# Patient Record
Sex: Male | Born: 1945 | Race: White | Hispanic: No | Marital: Married | Smoking: Former smoker
Health system: Southern US, Community
[De-identification: ages and names within clinical notes are randomized; demographics above are authoritative.]

## PROBLEM LIST (undated history)

## (undated) DIAGNOSIS — I890 Lymphedema, not elsewhere classified: Secondary | ICD-10-CM

## (undated) DIAGNOSIS — C801 Malignant (primary) neoplasm, unspecified: Secondary | ICD-10-CM

## (undated) DIAGNOSIS — I10 Essential (primary) hypertension: Secondary | ICD-10-CM

## (undated) DIAGNOSIS — E785 Hyperlipidemia, unspecified: Secondary | ICD-10-CM

## (undated) HISTORY — DX: Essential (primary) hypertension: I10

## (undated) HISTORY — DX: Lymphedema, not elsewhere classified: I89.0

## (undated) HISTORY — PX: WRIST FRACTURE SURGERY: SHX121

## (undated) HISTORY — DX: Malignant (primary) neoplasm, unspecified: C80.1

## (undated) HISTORY — DX: Hyperlipidemia, unspecified: E78.5

---

## 2006-07-24 DIAGNOSIS — C801 Malignant (primary) neoplasm, unspecified: Secondary | ICD-10-CM

## 2006-07-24 HISTORY — DX: Malignant (primary) neoplasm, unspecified: C80.1

## 2006-07-24 HISTORY — PX: OTHER SURGICAL HISTORY: SHX169

## 2006-11-09 ENCOUNTER — Ambulatory Visit: Payer: Self-pay | Admitting: Oncology

## 2006-12-10 LAB — CBC WITH DIFFERENTIAL/PLATELET
Basophils Absolute: 0 10*3/uL (ref 0.0–0.1)
Eosinophils Absolute: 0.1 10*3/uL (ref 0.0–0.5)
HGB: 15.5 g/dL (ref 13.0–17.1)
MCV: 89.5 fL (ref 81.6–98.0)
MONO%: 8.2 % (ref 0.0–13.0)
NEUT#: 1.4 10*3/uL — ABNORMAL LOW (ref 1.5–6.5)
RBC: 5 10*6/uL (ref 4.20–5.71)
RDW: 11.2 % (ref 11.2–14.6)
WBC: 3.2 10*3/uL — ABNORMAL LOW (ref 4.0–10.0)
lymph#: 1.5 10*3/uL (ref 0.9–3.3)

## 2006-12-10 LAB — COMPREHENSIVE METABOLIC PANEL
AST: 75 U/L — ABNORMAL HIGH (ref 0–37)
Albumin: 3.8 g/dL (ref 3.5–5.2)
Alkaline Phosphatase: 56 U/L (ref 39–117)
BUN: 13 mg/dL (ref 6–23)
Calcium: 8.9 mg/dL (ref 8.4–10.5)
Chloride: 106 mEq/L (ref 96–112)
Glucose, Bld: 85 mg/dL (ref 70–99)
Potassium: 4.1 mEq/L (ref 3.5–5.3)
Sodium: 140 mEq/L (ref 135–145)
Total Protein: 6.3 g/dL (ref 6.0–8.3)

## 2006-12-13 ENCOUNTER — Encounter: Payer: Self-pay | Admitting: Internal Medicine

## 2006-12-13 ENCOUNTER — Ambulatory Visit: Payer: Self-pay | Admitting: Internal Medicine

## 2006-12-13 DIAGNOSIS — E785 Hyperlipidemia, unspecified: Secondary | ICD-10-CM

## 2006-12-13 DIAGNOSIS — I10 Essential (primary) hypertension: Secondary | ICD-10-CM | POA: Insufficient documentation

## 2006-12-13 LAB — COMPREHENSIVE METABOLIC PANEL
ALT: 56 U/L — ABNORMAL HIGH (ref 0–53)
AST: 33 U/L (ref 0–37)
Alkaline Phosphatase: 51 U/L (ref 39–117)
BUN: 23 mg/dL (ref 6–23)
Creatinine, Ser: 1.08 mg/dL (ref 0.40–1.50)
Potassium: 4.2 mEq/L (ref 3.5–5.3)

## 2006-12-13 LAB — CONVERTED CEMR LAB
Cholesterol, target level: 200 mg/dL
HDL goal, serum: 40 mg/dL
LDL Goal: 130 mg/dL

## 2006-12-13 LAB — CBC WITH DIFFERENTIAL/PLATELET
BASO%: 0.8 % (ref 0.0–2.0)
Basophils Absolute: 0 10*3/uL (ref 0.0–0.1)
EOS%: 0.7 % (ref 0.0–7.0)
HCT: 44.2 % (ref 38.7–49.9)
HGB: 15.9 g/dL (ref 13.0–17.1)
LYMPH%: 40.8 % (ref 14.0–48.0)
MCH: 32 pg (ref 28.0–33.4)
MCHC: 36 g/dL — ABNORMAL HIGH (ref 32.0–35.9)
MCV: 89 fL (ref 81.6–98.0)
MONO%: 9.1 % (ref 0.0–13.0)
NEUT%: 48.6 % (ref 40.0–75.0)
Platelets: 107 10*3/uL — ABNORMAL LOW (ref 145–400)

## 2006-12-18 ENCOUNTER — Ambulatory Visit: Payer: Self-pay | Admitting: Oncology

## 2006-12-18 LAB — CBC WITH DIFFERENTIAL/PLATELET
BASO%: 0.9 % (ref 0.0–2.0)
Basophils Absolute: 0 10*3/uL (ref 0.0–0.1)
EOS%: 1.2 % (ref 0.0–7.0)
HGB: 15 g/dL (ref 13.0–17.1)
MCH: 31.4 pg (ref 28.0–33.4)
MCHC: 35.6 g/dL (ref 32.0–35.9)
MCV: 88.3 fL (ref 81.6–98.0)
MONO%: 6.5 % (ref 0.0–13.0)
RBC: 4.76 10*6/uL (ref 4.20–5.71)
RDW: 11.4 % (ref 11.2–14.6)

## 2006-12-18 LAB — COMPREHENSIVE METABOLIC PANEL
ALT: 48 U/L (ref 0–53)
AST: 30 U/L (ref 0–37)
Albumin: 3.6 g/dL (ref 3.5–5.2)
Alkaline Phosphatase: 54 U/L (ref 39–117)
BUN: 13 mg/dL (ref 6–23)
Potassium: 3.8 mEq/L (ref 3.5–5.3)

## 2006-12-24 LAB — CBC WITH DIFFERENTIAL/PLATELET
BASO%: 0.7 % (ref 0.0–2.0)
HCT: 43.5 % (ref 38.7–49.9)
HGB: 15.4 g/dL (ref 13.0–17.1)
LYMPH%: 51.6 % — ABNORMAL HIGH (ref 14.0–48.0)
MCH: 30.8 pg (ref 28.0–33.4)
MCV: 87 fL (ref 81.6–98.0)
RDW: 11.3 % (ref 11.2–14.6)

## 2006-12-24 LAB — COMPREHENSIVE METABOLIC PANEL
ALT: 66 U/L — ABNORMAL HIGH (ref 0–53)
Albumin: 3.3 g/dL — ABNORMAL LOW (ref 3.5–5.2)
CO2: 27 mEq/L (ref 19–32)
Calcium: 8.1 mg/dL — ABNORMAL LOW (ref 8.4–10.5)
Chloride: 107 mEq/L (ref 96–112)
Glucose, Bld: 105 mg/dL — ABNORMAL HIGH (ref 70–99)
Potassium: 3.9 mEq/L (ref 3.5–5.3)
Sodium: 140 mEq/L (ref 135–145)
Total Bilirubin: 0.6 mg/dL (ref 0.3–1.2)
Total Protein: 5.6 g/dL — ABNORMAL LOW (ref 6.0–8.3)

## 2006-12-27 LAB — COMPREHENSIVE METABOLIC PANEL
ALT: 65 U/L — ABNORMAL HIGH (ref 0–53)
Albumin: 3.1 g/dL — ABNORMAL LOW (ref 3.5–5.2)
Alkaline Phosphatase: 50 U/L (ref 39–117)
CO2: 28 mEq/L (ref 19–32)
Glucose, Bld: 100 mg/dL — ABNORMAL HIGH (ref 70–99)
Potassium: 4.2 mEq/L (ref 3.5–5.3)
Sodium: 137 mEq/L (ref 135–145)
Total Bilirubin: 0.6 mg/dL (ref 0.3–1.2)
Total Protein: 5.1 g/dL — ABNORMAL LOW (ref 6.0–8.3)

## 2006-12-27 LAB — CBC WITH DIFFERENTIAL/PLATELET
BASO%: 0.3 % (ref 0.0–2.0)
Eosinophils Absolute: 0 10*3/uL (ref 0.0–0.5)
LYMPH%: 49.7 % — ABNORMAL HIGH (ref 14.0–48.0)
MCHC: 34.7 g/dL (ref 32.0–35.9)
MONO#: 0.3 10*3/uL (ref 0.1–0.9)
NEUT#: 1 10*3/uL — ABNORMAL LOW (ref 1.5–6.5)
Platelets: 101 10*3/uL — ABNORMAL LOW (ref 145–400)
RBC: 4.76 10*6/uL (ref 4.20–5.71)
RDW: 11.3 % (ref 11.2–14.6)
WBC: 2.6 10*3/uL — ABNORMAL LOW (ref 4.0–10.0)

## 2006-12-28 LAB — CBC WITH DIFFERENTIAL/PLATELET
BASO%: 0.6 % (ref 0.0–2.0)
LYMPH%: 40 % (ref 14.0–48.0)
MCHC: 34.6 g/dL (ref 32.0–35.9)
MCV: 88.1 fL (ref 81.6–98.0)
MONO%: 11.7 % (ref 0.0–13.0)
Platelets: 100 10*3/uL — ABNORMAL LOW (ref 145–400)
RBC: 4.94 10*6/uL (ref 4.20–5.71)
RDW: 11.3 % (ref 11.2–14.6)
WBC: 2.2 10*3/uL — ABNORMAL LOW (ref 4.0–10.0)

## 2007-01-03 LAB — CBC WITH DIFFERENTIAL/PLATELET
BASO%: 0.9 % (ref 0.0–2.0)
MCHC: 35 g/dL (ref 32.0–35.9)
MONO#: 0.4 10*3/uL (ref 0.1–0.9)
NEUT#: 1.7 10*3/uL (ref 1.5–6.5)
RBC: 4.94 10*6/uL (ref 4.20–5.71)
RDW: 11.2 % (ref 11.2–14.6)
WBC: 3.3 10*3/uL — ABNORMAL LOW (ref 4.0–10.0)
lymph#: 1.2 10*3/uL (ref 0.9–3.3)

## 2007-01-10 ENCOUNTER — Encounter: Payer: Self-pay | Admitting: Internal Medicine

## 2007-01-10 LAB — COMPREHENSIVE METABOLIC PANEL
ALT: 28 U/L (ref 0–53)
CO2: 31 mEq/L (ref 19–32)
Calcium: 8.9 mg/dL (ref 8.4–10.5)
Chloride: 108 mEq/L (ref 96–112)
Creatinine, Ser: 0.99 mg/dL (ref 0.40–1.50)
Glucose, Bld: 94 mg/dL (ref 70–99)
Total Protein: 5.4 g/dL — ABNORMAL LOW (ref 6.0–8.3)

## 2007-01-10 LAB — CBC WITH DIFFERENTIAL/PLATELET
BASO%: 0.7 % (ref 0.0–2.0)
Eosinophils Absolute: 0.1 10*3/uL (ref 0.0–0.5)
HCT: 39.3 % (ref 38.7–49.9)
HGB: 13.8 g/dL (ref 13.0–17.1)
MCHC: 35.2 g/dL (ref 32.0–35.9)
MONO#: 0.6 10*3/uL (ref 0.1–0.9)
NEUT#: 3.8 10*3/uL (ref 1.5–6.5)
NEUT%: 65.7 % (ref 40.0–75.0)
WBC: 5.7 10*3/uL (ref 4.0–10.0)
lymph#: 1.2 10*3/uL (ref 0.9–3.3)

## 2007-01-31 ENCOUNTER — Telehealth: Payer: Self-pay | Admitting: Internal Medicine

## 2007-01-31 ENCOUNTER — Ambulatory Visit: Payer: Self-pay | Admitting: Oncology

## 2007-02-04 LAB — CBC WITH DIFFERENTIAL/PLATELET
BASO%: 0.6 % (ref 0.0–2.0)
HCT: 40.6 % (ref 38.7–49.9)
MCHC: 34.4 g/dL (ref 32.0–35.9)
MONO#: 0.2 10*3/uL (ref 0.1–0.9)
RBC: 4.49 10*6/uL (ref 4.20–5.71)
WBC: 4.7 10*3/uL (ref 4.0–10.0)
lymph#: 1.4 10*3/uL (ref 0.9–3.3)

## 2007-02-04 LAB — COMPREHENSIVE METABOLIC PANEL
ALT: 24 U/L (ref 0–53)
CO2: 29 mEq/L (ref 19–32)
Calcium: 8.6 mg/dL (ref 8.4–10.5)
Chloride: 107 mEq/L (ref 96–112)
Sodium: 143 mEq/L (ref 135–145)
Total Protein: 5.9 g/dL — ABNORMAL LOW (ref 6.0–8.3)

## 2007-02-19 LAB — CBC WITH DIFFERENTIAL/PLATELET
BASO%: 0.2 % (ref 0.0–2.0)
LYMPH%: 20.2 % (ref 14.0–48.0)
MCHC: 35.3 g/dL (ref 32.0–35.9)
MONO#: 0.2 10*3/uL (ref 0.1–0.9)
RBC: 4.53 10*6/uL (ref 4.20–5.71)
WBC: 2.4 10*3/uL — ABNORMAL LOW (ref 4.0–10.0)
lymph#: 0.5 10*3/uL — ABNORMAL LOW (ref 0.9–3.3)

## 2007-02-19 LAB — COMPREHENSIVE METABOLIC PANEL
ALT: 43 U/L (ref 0–53)
AST: 33 U/L (ref 0–37)
CO2: 30 mEq/L (ref 19–32)
Chloride: 101 mEq/L (ref 96–112)
Sodium: 136 mEq/L (ref 135–145)
Total Bilirubin: 0.6 mg/dL (ref 0.3–1.2)
Total Protein: 6 g/dL (ref 6.0–8.3)

## 2007-02-25 LAB — CBC WITH DIFFERENTIAL/PLATELET
EOS%: 1.1 % (ref 0.0–7.0)
HGB: 14.4 g/dL (ref 13.0–17.1)
MCH: 31.4 pg (ref 28.0–33.4)
MCV: 88.9 fL (ref 81.6–98.0)
MONO%: 12.1 % (ref 0.0–13.0)
NEUT#: 1.2 10*3/uL — ABNORMAL LOW (ref 1.5–6.5)
RBC: 4.57 10*6/uL (ref 4.20–5.71)
RDW: 14.6 % (ref 11.2–14.6)
lymph#: 0.9 10*3/uL (ref 0.9–3.3)

## 2007-02-25 LAB — COMPREHENSIVE METABOLIC PANEL
ALT: 37 U/L (ref 0–53)
AST: 30 U/L (ref 0–37)
Albumin: 4 g/dL (ref 3.5–5.2)
Alkaline Phosphatase: 69 U/L (ref 39–117)
Calcium: 8.3 mg/dL — ABNORMAL LOW (ref 8.4–10.5)
Chloride: 106 mEq/L (ref 96–112)
Potassium: 4.3 mEq/L (ref 3.5–5.3)
Sodium: 142 mEq/L (ref 135–145)
Total Protein: 6.1 g/dL (ref 6.0–8.3)

## 2007-03-06 LAB — CBC WITH DIFFERENTIAL/PLATELET
BASO%: 0.6 % (ref 0.0–2.0)
Basophils Absolute: 0 10e3/uL (ref 0.0–0.1)
EOS%: 1.2 % (ref 0.0–7.0)
Eosinophils Absolute: 0 10e3/uL (ref 0.0–0.5)
HCT: 41.3 % (ref 38.7–49.9)
HGB: 14.4 g/dL (ref 13.0–17.1)
LYMPH%: 43.2 % (ref 14.0–48.0)
MCH: 31.1 pg (ref 28.0–33.4)
MCHC: 35 g/dL (ref 32.0–35.9)
MCV: 89.1 fL (ref 81.6–98.0)
MONO#: 0.4 10e3/uL (ref 0.1–0.9)
MONO%: 11.5 % (ref 0.0–13.0)
NEUT#: 1.3 10e3/uL — ABNORMAL LOW (ref 1.5–6.5)
NEUT%: 43.5 % (ref 40.0–75.0)
Platelets: 74 10e3/uL — ABNORMAL LOW (ref 145–400)
RBC: 4.64 10e6/uL (ref 4.20–5.71)
RDW: 12.5 % (ref 11.2–14.6)
WBC: 3.1 10e3/uL — ABNORMAL LOW (ref 4.0–10.0)
lymph#: 1.3 10e3/uL (ref 0.9–3.3)

## 2007-03-13 LAB — CBC WITH DIFFERENTIAL/PLATELET
EOS%: 2.6 % (ref 0.0–7.0)
Eosinophils Absolute: 0.1 10*3/uL (ref 0.0–0.5)
MCV: 89 fL (ref 81.6–98.0)
MONO%: 15 % — ABNORMAL HIGH (ref 0.0–13.0)
NEUT#: 1.2 10*3/uL — ABNORMAL LOW (ref 1.5–6.5)
RBC: 4.49 10*6/uL (ref 4.20–5.71)
RDW: 12.6 % (ref 11.2–14.6)
lymph#: 1.1 10*3/uL (ref 0.9–3.3)

## 2007-03-19 ENCOUNTER — Ambulatory Visit: Payer: Self-pay | Admitting: Oncology

## 2007-03-21 LAB — CBC WITH DIFFERENTIAL/PLATELET
Eosinophils Absolute: 0 10*3/uL (ref 0.0–0.5)
HGB: 13.8 g/dL (ref 13.0–17.1)
MONO#: 0.3 10*3/uL (ref 0.1–0.9)
NEUT#: 1.5 10*3/uL (ref 1.5–6.5)
Platelets: 80 10*3/uL — ABNORMAL LOW (ref 145–400)
RBC: 4.4 10*6/uL (ref 4.20–5.71)
RDW: 15.1 % — ABNORMAL HIGH (ref 11.2–14.6)
WBC: 2.3 10*3/uL — ABNORMAL LOW (ref 4.0–10.0)

## 2007-03-21 LAB — COMPREHENSIVE METABOLIC PANEL
ALT: 39 U/L (ref 0–53)
AST: 28 U/L (ref 0–37)
Alkaline Phosphatase: 64 U/L (ref 39–117)
BUN: 18 mg/dL (ref 6–23)
CO2: 26 mEq/L (ref 19–32)
Creatinine, Ser: 0.88 mg/dL (ref 0.40–1.50)
Glucose, Bld: 91 mg/dL (ref 70–99)
Sodium: 144 mEq/L (ref 135–145)

## 2007-04-02 ENCOUNTER — Ambulatory Visit: Payer: Self-pay | Admitting: Internal Medicine

## 2007-04-03 LAB — CONVERTED CEMR LAB
ALT: 44 units/L (ref 0–53)
AST: 33 units/L (ref 0–37)
Albumin: 3.7 g/dL (ref 3.5–5.2)
Alkaline Phosphatase: 69 units/L (ref 39–117)
BUN: 21 mg/dL (ref 6–23)
Bilirubin, Direct: 0.1 mg/dL (ref 0.0–0.3)
CO2: 29 meq/L (ref 19–32)
Calcium: 8.6 mg/dL (ref 8.4–10.5)
Chloride: 108 meq/L (ref 96–112)
Cholesterol: 181 mg/dL (ref 0–200)
Creatinine, Ser: 0.7 mg/dL (ref 0.4–1.5)
Direct LDL: 68 mg/dL
GFR calc Af Amer: 148 mL/min
GFR calc non Af Amer: 122 mL/min
Glucose, Bld: 99 mg/dL (ref 70–99)
HDL: 31.2 mg/dL — ABNORMAL LOW (ref >39.0)
Potassium: 4.3 meq/L (ref 3.5–5.1)
Sodium: 143 meq/L (ref 135–145)
Total Bilirubin: 0.8 mg/dL (ref 0.3–1.2)
Total CHOL/HDL Ratio: 5.8
Total Protein: 5.7 g/dL — ABNORMAL LOW (ref 6.0–8.3)
Triglycerides: 551 mg/dL (ref 0–149)
VLDL: 110 mg/dL — ABNORMAL HIGH (ref 0–40)

## 2007-04-18 ENCOUNTER — Encounter: Payer: Self-pay | Admitting: Internal Medicine

## 2007-04-18 LAB — CBC WITH DIFFERENTIAL/PLATELET
BASO%: 0.1 % (ref 0.0–2.0)
Basophils Absolute: 0 10*3/uL (ref 0.0–0.1)
EOS%: 3.6 % (ref 0.0–7.0)
HCT: 40.9 % (ref 38.7–49.9)
HGB: 14.6 g/dL (ref 13.0–17.1)
LYMPH%: 41.7 % (ref 14.0–48.0)
MCH: 32.4 pg (ref 28.0–33.4)
MCHC: 35.6 g/dL (ref 32.0–35.9)
MCV: 91.1 fL (ref 81.6–98.0)
MONO%: 13.3 % — ABNORMAL HIGH (ref 0.0–13.0)
NEUT%: 41.3 % (ref 40.0–75.0)

## 2007-04-18 LAB — COMPREHENSIVE METABOLIC PANEL
AST: 28 U/L (ref 0–37)
Alkaline Phosphatase: 62 U/L (ref 39–117)
BUN: 21 mg/dL (ref 6–23)
Calcium: 8.4 mg/dL (ref 8.4–10.5)
Creatinine, Ser: 0.88 mg/dL (ref 0.40–1.50)
Total Bilirubin: 0.5 mg/dL (ref 0.3–1.2)

## 2007-04-25 LAB — CBC WITH DIFFERENTIAL/PLATELET
Basophils Absolute: 0 10*3/uL (ref 0.0–0.1)
EOS%: 2.7 % (ref 0.0–7.0)
HCT: 42.1 % (ref 38.7–49.9)
HGB: 14.8 g/dL (ref 13.0–17.1)
LYMPH%: 23 % (ref 14.0–48.0)
MCH: 31.9 pg (ref 28.0–33.4)
MCV: 90.7 fL (ref 81.6–98.0)
NEUT%: 60.2 % (ref 40.0–75.0)
Platelets: 81 10*3/uL — ABNORMAL LOW (ref 145–400)
lymph#: 0.6 10*3/uL — ABNORMAL LOW (ref 0.9–3.3)

## 2007-06-06 ENCOUNTER — Ambulatory Visit: Payer: Self-pay | Admitting: Oncology

## 2007-06-10 ENCOUNTER — Encounter: Payer: Self-pay | Admitting: Internal Medicine

## 2007-06-10 LAB — CBC WITH DIFFERENTIAL/PLATELET
Basophils Absolute: 0 10*3/uL (ref 0.0–0.1)
EOS%: 3.7 % (ref 0.0–7.0)
HCT: 39.4 % (ref 38.7–49.9)
HGB: 13.7 g/dL (ref 13.0–17.1)
MCH: 32.2 pg (ref 28.0–33.4)
MCHC: 34.8 g/dL (ref 32.0–35.9)
MCV: 92.6 fL (ref 81.6–98.0)
MONO%: 7.2 % (ref 0.0–13.0)
NEUT%: 53 % (ref 40.0–75.0)
RDW: 14.4 % (ref 11.2–14.6)

## 2007-06-10 LAB — COMPREHENSIVE METABOLIC PANEL
AST: 28 U/L (ref 0–37)
Alkaline Phosphatase: 63 U/L (ref 39–117)
BUN: 16 mg/dL (ref 6–23)
Creatinine, Ser: 0.82 mg/dL (ref 0.40–1.50)

## 2007-07-01 LAB — CBC WITH DIFFERENTIAL/PLATELET
Basophils Absolute: 0.1 10*3/uL (ref 0.0–0.1)
EOS%: 3.8 % (ref 0.0–7.0)
HGB: 13.9 g/dL (ref 13.0–17.1)
MCH: 33.5 pg — ABNORMAL HIGH (ref 28.0–33.4)
NEUT#: 1.7 10*3/uL (ref 1.5–6.5)
RDW: 14.3 % (ref 11.2–14.6)
lymph#: 1 10*3/uL (ref 0.9–3.3)

## 2007-07-16 ENCOUNTER — Ambulatory Visit: Payer: Self-pay | Admitting: Oncology

## 2007-07-22 ENCOUNTER — Encounter: Payer: Self-pay | Admitting: Internal Medicine

## 2007-07-22 LAB — CBC WITH DIFFERENTIAL/PLATELET
Basophils Absolute: 0 10*3/uL (ref 0.0–0.1)
Eosinophils Absolute: 0.1 10*3/uL (ref 0.0–0.5)
HGB: 14.4 g/dL (ref 13.0–17.1)
MCV: 93.2 fL (ref 81.6–98.0)
MONO#: 0.3 10*3/uL (ref 0.1–0.9)
MONO%: 8.7 % (ref 0.0–13.0)
NEUT#: 2.2 10*3/uL (ref 1.5–6.5)
RDW: 13.8 % (ref 11.2–14.6)
WBC: 3.7 10*3/uL — ABNORMAL LOW (ref 4.0–10.0)
lymph#: 1 10*3/uL (ref 0.9–3.3)

## 2007-07-22 LAB — COMPREHENSIVE METABOLIC PANEL
Albumin: 4.2 g/dL (ref 3.5–5.2)
BUN: 16 mg/dL (ref 6–23)
CO2: 25 mEq/L (ref 19–32)
Calcium: 9 mg/dL (ref 8.4–10.5)
Chloride: 106 mEq/L (ref 96–112)
Glucose, Bld: 89 mg/dL (ref 70–99)
Potassium: 4.2 mEq/L (ref 3.5–5.3)

## 2007-07-25 HISTORY — PX: OTHER SURGICAL HISTORY: SHX169

## 2007-08-01 ENCOUNTER — Encounter: Payer: Self-pay | Admitting: Internal Medicine

## 2007-08-23 ENCOUNTER — Encounter: Payer: Self-pay | Admitting: Internal Medicine

## 2007-08-23 LAB — CBC WITH DIFFERENTIAL/PLATELET
Basophils Absolute: 0 10*3/uL (ref 0.0–0.1)
EOS%: 4.3 % (ref 0.0–7.0)
Eosinophils Absolute: 0.2 10*3/uL (ref 0.0–0.5)
HGB: 15.3 g/dL (ref 13.0–17.1)
NEUT#: 3.2 10*3/uL (ref 1.5–6.5)
RBC: 4.55 10*6/uL (ref 4.20–5.71)
RDW: 11.8 % (ref 11.2–14.6)
WBC: 4.7 10*3/uL (ref 4.0–10.0)
lymph#: 0.9 10*3/uL (ref 0.9–3.3)

## 2007-09-16 ENCOUNTER — Ambulatory Visit (HOSPITAL_COMMUNITY): Admission: RE | Admit: 2007-09-16 | Discharge: 2007-09-16 | Payer: Self-pay | Admitting: Surgical Oncology

## 2007-10-03 ENCOUNTER — Ambulatory Visit: Payer: Self-pay | Admitting: Internal Medicine

## 2007-10-03 DIAGNOSIS — G4700 Insomnia, unspecified: Secondary | ICD-10-CM

## 2007-10-04 LAB — CONVERTED CEMR LAB
Alkaline Phosphatase: 52 units/L (ref 39–117)
BUN: 14 mg/dL (ref 6–23)
CO2: 29 meq/L (ref 19–32)
Creatinine, Ser: 1 mg/dL (ref 0.4–1.5)
Potassium: 4.2 meq/L (ref 3.5–5.1)
Total Bilirubin: 0.7 mg/dL (ref 0.3–1.2)
Total Protein: 5.8 g/dL — ABNORMAL LOW (ref 6.0–8.3)

## 2007-11-15 ENCOUNTER — Ambulatory Visit: Payer: Self-pay | Admitting: Oncology

## 2007-11-19 ENCOUNTER — Encounter: Payer: Self-pay | Admitting: Internal Medicine

## 2008-05-19 ENCOUNTER — Ambulatory Visit: Payer: Self-pay | Admitting: Internal Medicine

## 2008-06-16 ENCOUNTER — Ambulatory Visit: Payer: Self-pay | Admitting: Oncology

## 2008-06-19 ENCOUNTER — Encounter: Payer: Self-pay | Admitting: Internal Medicine

## 2008-06-19 LAB — CBC WITH DIFFERENTIAL/PLATELET
BASO%: 0.4 % (ref 0.0–2.0)
EOS%: 4.8 % (ref 0.0–7.0)
HGB: 15.4 g/dL (ref 13.0–17.1)
MCH: 33.1 pg (ref 28.0–33.4)
MCHC: 34.9 g/dL (ref 32.0–35.9)
MCV: 94.9 fL (ref 81.6–98.0)
MONO%: 7.5 % (ref 0.0–13.0)
RBC: 4.66 10*6/uL (ref 4.20–5.71)
RDW: 12.9 % (ref 11.2–14.6)
lymph#: 2.1 10*3/uL (ref 0.9–3.3)

## 2008-12-15 ENCOUNTER — Ambulatory Visit: Payer: Self-pay | Admitting: Oncology

## 2008-12-17 ENCOUNTER — Encounter: Payer: Self-pay | Admitting: Internal Medicine

## 2008-12-17 LAB — CBC WITH DIFFERENTIAL/PLATELET
BASO%: 0.4 % (ref 0.0–2.0)
Basophils Absolute: 0 10*3/uL (ref 0.0–0.1)
EOS%: 2.7 % (ref 0.0–7.0)
Eosinophils Absolute: 0.2 10*3/uL (ref 0.0–0.5)
HCT: 44.4 % (ref 38.4–49.9)
HGB: 15.3 g/dL (ref 13.0–17.1)
LYMPH%: 25.4 % (ref 14.0–49.0)
MCH: 32.5 pg (ref 27.2–33.4)
MCHC: 34.5 g/dL (ref 32.0–36.0)
MCV: 94.1 fL (ref 79.3–98.0)
MONO#: 0.4 10*3/uL (ref 0.1–0.9)
MONO%: 5.4 % (ref 0.0–14.0)
NEUT#: 4.5 10*3/uL (ref 1.5–6.5)
NEUT%: 66.1 % (ref 39.0–75.0)
Platelets: 143 10*3/uL (ref 140–400)
RBC: 4.71 10*6/uL (ref 4.20–5.82)
RDW: 13.1 % (ref 11.0–14.6)
WBC: 6.8 10*3/uL (ref 4.0–10.3)
lymph#: 1.7 10*3/uL (ref 0.9–3.3)

## 2009-01-11 ENCOUNTER — Telehealth: Payer: Self-pay | Admitting: Internal Medicine

## 2009-03-09 ENCOUNTER — Ambulatory Visit: Payer: Self-pay | Admitting: Internal Medicine

## 2009-03-09 DIAGNOSIS — Z85820 Personal history of malignant melanoma of skin: Secondary | ICD-10-CM | POA: Insufficient documentation

## 2009-03-11 LAB — CONVERTED CEMR LAB
AST: 21 units/L (ref 0–37)
Alkaline Phosphatase: 66 units/L (ref 39–117)
BUN: 15 mg/dL (ref 6–23)
Basophils Absolute: 0 10*3/uL (ref 0.0–0.1)
Bilirubin, Direct: 0.1 mg/dL (ref 0.0–0.3)
Calcium: 8.7 mg/dL (ref 8.4–10.5)
GFR calc non Af Amer: 80.23 mL/min (ref >60)
Glucose, Bld: 115 mg/dL — ABNORMAL HIGH (ref 70–99)
Hemoglobin: 15 g/dL (ref 13.0–17.0)
LDL Cholesterol: 58 mg/dL (ref 0–99)
Lymphocytes Relative: 26.5 % (ref 12.0–46.0)
Monocytes Relative: 7 % (ref 3.0–12.0)
Neutrophils Relative %: 62.1 % (ref 43.0–77.0)
Platelets: 133 10*3/uL — ABNORMAL LOW (ref 150.0–400.0)
RDW: 13.1 % (ref 11.5–14.6)
Sodium: 142 meq/L (ref 135–145)
TSH: 1.38 microintl units/mL (ref 0.35–5.50)
Total Bilirubin: 0.8 mg/dL (ref 0.3–1.2)
VLDL: 13 mg/dL (ref 0.0–40.0)

## 2009-03-26 ENCOUNTER — Ambulatory Visit: Payer: Self-pay | Admitting: Gastroenterology

## 2009-04-09 ENCOUNTER — Encounter: Payer: Self-pay | Admitting: Gastroenterology

## 2009-04-09 ENCOUNTER — Ambulatory Visit: Payer: Self-pay | Admitting: Gastroenterology

## 2009-04-09 LAB — HM COLONOSCOPY

## 2009-04-13 ENCOUNTER — Encounter: Payer: Self-pay | Admitting: Gastroenterology

## 2009-06-25 ENCOUNTER — Ambulatory Visit: Payer: Self-pay | Admitting: Oncology

## 2009-06-29 ENCOUNTER — Encounter: Payer: Self-pay | Admitting: Internal Medicine

## 2010-03-01 ENCOUNTER — Ambulatory Visit: Payer: Self-pay | Admitting: Oncology

## 2010-03-30 ENCOUNTER — Telehealth: Payer: Self-pay | Admitting: Internal Medicine

## 2010-04-15 ENCOUNTER — Ambulatory Visit: Payer: Self-pay | Admitting: Internal Medicine

## 2010-04-18 LAB — CONVERTED CEMR LAB
Albumin: 3.9 g/dL (ref 3.5–5.2)
Alkaline Phosphatase: 61 units/L (ref 39–117)
Bilirubin, Direct: 0.1 mg/dL (ref 0.0–0.3)
Calcium: 8.7 mg/dL (ref 8.4–10.5)
Creatinine, Ser: 1.1 mg/dL (ref 0.4–1.5)
Direct LDL: 77.5 mg/dL
GFR calc non Af Amer: 70.15 mL/min (ref >60)
HDL: 41.2 mg/dL (ref >39.00)
Sodium: 139 meq/L (ref 135–145)
Triglycerides: 206 mg/dL — ABNORMAL HIGH (ref 0.0–149.0)
VLDL: 41.2 mg/dL — ABNORMAL HIGH (ref 0.0–40.0)

## 2010-04-22 ENCOUNTER — Telehealth: Payer: Self-pay | Admitting: Internal Medicine

## 2010-05-05 ENCOUNTER — Ambulatory Visit: Payer: Self-pay | Admitting: Oncology

## 2010-05-09 ENCOUNTER — Encounter: Payer: Self-pay | Admitting: Internal Medicine

## 2010-07-13 ENCOUNTER — Ambulatory Visit: Payer: Self-pay | Admitting: Internal Medicine

## 2010-08-23 NOTE — Letter (Signed)
Summary:  Cancer Center  [facility]   Imported By: Maryln Gottron 06/13/2010 09:55:03  _____________________________________________________________________  External Attachment:    Type:   Image     Comment:   External Document

## 2010-08-23 NOTE — Progress Notes (Signed)
Summary: Refill--Alprazolam  Phone Note Refill Request Message from:  Fax from Pharmacy on March 30, 2010 2:10 PM  Refills Requested: Medication #1:  ALPRAZOLAM 0.5 MG TABS Take 1 tablet by mouth at bedtime as needed--NEEDS OFFICE VISIT FOR ADDITIONAL REFILLS. Please advise.  Next Appointment Scheduled: none Initial call taken by: Lucious Groves CMA,  March 30, 2010 2:09 PM  Follow-up for Phone Call        ok to refill #10/0 refills Follow-up by: Birdie Sons MD,  March 31, 2010 6:02 PM    Prescriptions: ALPRAZOLAM 0.5 MG TABS (ALPRAZOLAM) Take 1 tablet by mouth at bedtime as needed--NEEDS OFFICE VISIT FOR ADDITIONAL REFILLS  #10 x 0   Entered by:   Lucious Groves CMA   Authorized by:   Birdie Sons MD   Signed by:   Lynann Beaver CMA on 04/01/2010   Method used:   Telephoned to ...       [facility]* (retail)       [address]       [place], [place]  [address]       Ph: [phone]       Fax: [phone]   RxID:   [id number]

## 2010-08-23 NOTE — Progress Notes (Signed)
Summary: Alprazolam clarification  Phone Note From Pharmacy   Caller: Patient Call For: Birdie Sons MD Caller: [facility] Call For: [facility]  Summary of Call: [facility] faxed clarification on Alprazolam 0.5mg .  Questioning #10 with 2 RF.  Did Dr Cato Mulligan mean #30 with 2 RF? Initial call taken by: Sid Falcon LPN,  April 22, 2010 2:19 PM  Follow-up for Phone Call        #30 is fine Follow-up by: Birdie Sons MD,  April 22, 2010 2:22 PM  Additional Follow-up for Phone Call Additional follow up Details #1::        Rx called in #30 with 2 refills Additional Follow-up by: Sid Falcon LPN,  April 22, 2010 3:40 PM    Prescriptions: ALPRAZOLAM 0.5 MG TABS (ALPRAZOLAM) Take 1 tablet by mouth at bedtime as needed--NEEDS OFFICE VISIT FOR ADDITIONAL REFILLS  #30 x 2   Entered by:   Sid Falcon LPN   Authorized by:   Birdie Sons MD   Signed by:   Sid Falcon LPN on 16/04/9603   Method used:   Telephoned to ...       [facility]* (retail)       [address]       [place], [place]  [address]       Ph: [phone]       Fax: [phone]   RxID:   [id number]

## 2010-08-23 NOTE — Assessment & Plan Note (Signed)
Summary: med check and refill/cjr   Vital Signs:  Patient profile:   65 year old male Weight:      217 pounds BMI:     34.11 Temp:     99.4 degrees F oral Pulse rate:   72 / minute BP sitting:   120 / 80  (left arm) Cuff size:   regular  Vitals Entered By: Romualdo Bolk, CMA (AAMA) (April 15, 2010 10:30 AM)  Nutrition Counseling: Patient's BMI is greater than 25 and therefore counseled on weight management options. CC: Follow-up visit on meds   CC:  Follow-up visit on meds.  History of Present Illness:  Follow-Up Visit      This is a 65 year old man who presents for Follow-up visit.  The patient denies chest pain and palpitations.  Since the last visit the patient notes no new problems or concerns---admits to daytime sleepiness, terrible snoring.  The patient reports taking meds as prescribed.  When questioned about possible medication side effects, the patient notes none.   melanoma---sees dr Truett Perna q 6 months  All other systems reviewed and were negative   Preventive Screening-Counseling & Management  Alcohol-Tobacco     Alcohol drinks/day: 3     Smoking Status: quit  Current Problems (verified): 1)  Malignant Melanoma Skin Lower Limb Including Hip  (ICD-172.7) 2)  Insomnia-sleep Disorder-unspec  (ICD-780.52) 3)  Hypertension  (ICD-401.9) 4)  Hyperlipidemia  (ICD-272.4)  Current Medications (verified): 1)  Crestor 10 Mg Tabs (Rosuvastatin Calcium) .... Take 1 Tablet By Mouth Once A Day--Needs Office Visit For Additiona; Refills 2)  Metoprolol Tartrate 50 Mg Tabs (Metoprolol Tartrate) .... Take 1 Tablet By Mouth Twice A Day 3)  Alprazolam 0.5 Mg Tabs (Alprazolam) .... 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   Head:  normocephalic and atraumatic.   Eyes:  pupils equal and pupils round.   Ears:  R ear normal and L ear normal.   Neck:  No deformities, masses, or tenderness noted. Chest Wall:  No deformities, masses, tenderness or gynecomastia noted. Lungs:  Normal respiratory effort, chest expands symmetrically. Lungs are clear to auscultation, no crackles or wheezes. Abdomen:  soft and non-tender.   Skin:  turgor normal and color normal.   Psych:  good eye contact and not anxious appearing.     Impression & Recommendations:  Problem # 1:  MALIGNANT MELANOMA SKIN LOWER LIMB INCLUDING HIP (ICD-172.7) followed by [facility] and dr Truett Perna  Problem # 2:  INSOMNIA-SLEEP DISORDER-UNSPEC (ICD-780.52)  may have sleep apnea schedule study  Orders: Sleep Disorder Referral (Sleep Disorder)  Problem # 3:  HYPERTENSION (ICD-401.9)  controlled continue current medications  His updated medication list for this problem includes:    Metoprolol Tartrate 50 Mg Tabs (Metoprolol tartrate) .Marland Kitchen... Take 1 tablet by mouth twice a day  BP today: 120/80 Prior BP: 130/86 (03/09/2009)  Prior 10  Yr Risk Heart Disease: Not enough information (12/13/2006)  Labs Reviewed: K+: 4.8 (03/09/2009) Creat: : 1.0 (03/09/2009)   Chol: 115 (03/09/2009)   HDL: 44.10 (03/09/2009)   LDL: 58 (03/09/2009)   TG: 65.0 (03/09/2009)  Orders: Venipuncture (09811) Specimen Handling (91478) TLB-BMP (Basic Metabolic Panel-BMET)  (80048-METABOL)  Problem # 4:  HYPERLIPIDEMIA (ICD-272.4)  controlled needs labs today His updated medication list for this problem includes:    Crestor 10 Mg Tabs (Rosuvastatin calcium) .Marland Kitchen... Take 1 tablet by mouth once a day--needs office visit for additiona; refills  Labs Reviewed: SGOT: 21 (03/09/2009)   SGPT: 34 (03/09/2009)  Lipid Goals: Chol Goal: 200 (12/13/2006)   HDL Goal: 40 (12/13/2006)   LDL Goal: 130 (12/13/2006)   TG Goal: 150 (12/13/2006)  Prior 10 Yr Risk Heart Disease: Not enough information (12/13/2006)   HDL:44.10 (03/09/2009), 31.2 (04/02/2007)  LDL:58 (03/09/2009), DEL (29/56/2130)  Chol:115 (03/09/2009), 181 (04/02/2007)  Trig:65.0 (03/09/2009), 551 (04/02/2007)  Orders: Specimen Handling (86578) TLB-Hepatic/Liver Function Pnl (80076-HEPATIC) TLB-Lipid Panel (80061-LIPID) TLB-TSH (Thyroid Stimulating Hormone) (84443-TSH)  Complete Medication List: 1)  Crestor 10 Mg Tabs (Rosuvastatin calcium) .... Take 1 tablet by mouth once a day--needs office visit for additiona; refills 2)  Metoprolol Tartrate 50 Mg Tabs (Metoprolol tartrate) .... Take 1 tablet by mouth twice a day 3)  Alprazolam 0.5 Mg Tabs (Alprazolam) .... Take 1 tablet by mouth at bedtime as needed--needs office visit for additional refills  Other Orders: Admin 1st Vaccine (46962) Flu Vaccine 36yrs + (95284) Prescriptions: METOPROLOL TARTRATE 50 MG TABS (METOPROLOL TARTRATE) Take 1 tablet by mouth twice a day  #180 x 3   Entered and Authorized by:   Birdie Sons MD   Signed by:   Birdie Sons MD on 04/15/2010   Method used:   Electronically to        [facility]* (retail)       [address]       [place], [place]  [address]       Ph: [phone]       Fax: [phone]   RxID:   [id number] CRESTOR 10 MG TABS (ROSUVASTATIN CALCIUM) Take 1 tablet by mouth once a day--NEEDS OFFICE VISIT FOR ADDITIONA; REFILLS  #90 x 3   Entered and Authorized by:   Birdie Sons MD   Signed  by:   Birdie Sons MD on 04/15/2010   Method used:   Electronically to        [facility]* (retail)       [address]       [place], [place]  [address]       Ph: [phone]       Fax: [phone]   RxID:   [id number] CRESTOR 10 MG TABS (ROSUVASTATIN CALCIUM) Take 1 tablet by mouth once a day--NEEDS OFFICE VISIT FOR ADDITIONA; REFILLS  #90 x 3   Entered and Authorized by:   Birdie Sons MD   Signed by:   Birdie Sons MD on 04/15/2010   Method used:   Print then Give to Patient   RxID:   [id number] METOPROLOL TARTRATE 50 MG TABS (METOPROLOL TARTRATE) Take 1 tablet by mouth twice a day  #180 x 3   Entered and Authorized by:   Birdie Sons MD   Signed by:   Birdie Sons MD on 04/15/2010   Method used:   Print then Give to Patient   RxID:   [id number] ALPRAZOLAM 0.5 MG TABS (ALPRAZOLAM) Take 1 tablet by mouth at bedtime as  needed--NEEDS OFFICE VISIT FOR ADDITIONAL REFILLS  #10 x 2   Entered and Authorized by:   Birdie Sons MD   Signed by:   Birdie Sons MD on 04/15/2010   Method used:   Print then Give to Patient   RxID:   [id number]   Flu Vaccine Consent Questions     Do you have a history of severe allergic reactions to this vaccine? no    Any prior history of allergic reactions to egg and/or gelatin? no    Do you have a sensitivity to the preservative Thimersol? no    Do you have a past history of Guillan-Barre Syndrome? no    Do you currently have an acute febrile illness? no    Have you ever had a severe reaction to latex? no    Vaccine information given and explained to patient? yes    Are you currently pregnant? no    Lot Number:[id number]   Exp Date:01/21/2011   Site Given  Left Deltoid IM Romualdo Bolk, CMA (AAMA)  April 15, 2010 10:32 AM   .lbflu

## 2010-08-23 NOTE — Letter (Signed)
Summary: [facility]  [facility]   Imported By: Maryln Gottron 08/05/2009 15:01:16  _____________________________________________________________________  External Attachment:    Type:   Image     Comment:   External Document

## 2010-08-25 NOTE — Assessment & Plan Note (Signed)
Summary: RENEW MEDS//SLM   Vital Signs:  Patient profile:   65 year old male Weight:      220 pounds Temp:     98.6 degrees F oral Pulse rate:   64 / minute Pulse rhythm:   regular BP sitting:   114 / 84  (left arm) Cuff size:   large  Vitals Entered By: Alfred Levins, CMA (July 13, 2010 11:57 AM) CC: renew meds   CC:  renew meds.  History of Present Illness: here for refills-misunderstanding---meds refilled  Current Medications (verified): 1)  Crestor 10 Mg Tabs (Rosuvastatin Calcium) .... Take 1 Tablet By Mouth Once A Day--Needs Office Visit For Additiona; Refills 2)  Metoprolol Tartrate 50 Mg Tabs (Metoprolol Tartrate) .... Take 1 Tablet By Mouth Twice A Day 3)  Alprazolam 0.5 Mg Tabs (Alprazolam) .... Take 1 Tablet By Mouth At Bedtime As Needed--Needs Office Visit For Additional Refills  Allergies (verified): No Known Drug Allergies  Physical Exam  General:  alert and well-developed.   Head:  normocephalic and atraumatic.   Neck:  No deformities, masses, or tenderness noted. Lungs:  Normal respiratory effort, chest expands symmetrically. Lungs are clear to auscultation, no crackles or wheezes. Heart:  Normal rate and regular rhythm. S1 and S2 normal without gallop, murmur, click, rub or other extra sounds. Abdomen:  soft and non-tender.   Skin:  turgor normal and color normal.   Psych:  memory intact for recent and remote and normally interactive.     Impression & Recommendations:  Problem # 1:  MALIGNANT MELANOMA SKIN LOWER LIMB INCLUDING HIP (ICD-172.7) followed by oncology  Problem # 2:  HYPERTENSION (ICD-401.9)  controlled continue current medications  His updated medication list for this problem includes:    Metoprolol Tartrate 50 Mg Tabs (Metoprolol tartrate) .Marland Kitchen... Take 1 tablet by mouth twice a day  BP today: 114/84 Prior BP: 120/80 (04/15/2010)  Prior 10 Yr Risk Heart Disease: Not enough information (12/13/2006)  Labs Reviewed: K+: 4.4  (04/15/2010) Creat: : 1.1 (04/15/2010)   Chol: 144 (04/15/2010)   HDL: 41.20 (04/15/2010)   LDL: 58 (03/09/2009)   TG: 206.0 (04/15/2010)  Orders: No Charge Patient Arrived (NCPA0) (NCPA0)  Problem # 3:  HYPERLIPIDEMIA (ICD-272.4)  His updated medication list for this problem includes:    Crestor 10 Mg Tabs (Rosuvastatin calcium) .Marland Kitchen... Take 1 tablet by mouth once a day--  Labs Reviewed: SGOT: 23 (04/15/2010)   SGPT: 35 (04/15/2010)  Lipid Goals: Chol Goal: 200 (12/13/2006)   HDL Goal: 40 (12/13/2006)   LDL Goal: 130 (12/13/2006)   TG Goal: 150 (12/13/2006)  Prior 10 Yr Risk Heart Disease: Not enough information (12/13/2006)   HDL:41.20 (04/15/2010), 44.10 (03/09/2009)  LDL:58 (03/09/2009), DEL (16/04/9603)  Chol:144 (04/15/2010), 115 (03/09/2009)  Trig:206.0 (04/15/2010), 65.0 (03/09/2009)  Orders: No Charge Patient Arrived (NCPA0) (NCPA0)  Problem # 4:  OBESITY (ICD-278.00)  Ht: 67 (12/13/2006)   Wt: 220 (07/13/2010)   BMI: 34.11 (04/15/2010)  Complete Medication List: 1)  Crestor 10 Mg Tabs (Rosuvastatin calcium) .... Take 1 tablet by mouth once a day-- 2)  Metoprolol Tartrate 50 Mg Tabs (Metoprolol tartrate) .... Take 1 tablet by mouth twice a day 3)  Alprazolam 0.5 Mg Tabs (Alprazolam) .... Take 1 tablet by mouth at bedtime as needed-- Prescriptions: ALPRAZOLAM 0.5 MG TABS (ALPRAZOLAM) Take 1 tablet by mouth at bedtime as needed--  #30 x 3   Entered by:   Alfred Levins, CMA   Authorized by:   Birdie Sons MD  Signed by:   Alfred Levins, CMA on 07/15/2010   Method used:   Telephoned to ...       [facility]* (retail)       [address]       [place], [place]  [address]       Ph: [phone]       Fax: [phone]   RxID:   [id number] METOPROLOL TARTRATE 50 MG TABS (METOPROLOL TARTRATE) Take 1 tablet by mouth twice a day  #180 x 3   Entered and Authorized by:   Birdie Sons MD   Signed by:   Birdie Sons MD on 07/13/2010   Method used:    Electronically to        [facility]* (retail)       [address]       [place], [place]  [address]       Ph: [phone]       Fax: [phone]   RxID:   [id number] CRESTOR 10 MG TABS (ROSUVASTATIN CALCIUM) Take 1 tablet by mouth once a day--  #90 x 3   Entered and Authorized by:   Birdie Sons MD   Signed by:   Birdie Sons MD on 07/13/2010   Method used:   Electronically to        [facility]* (retail)       [address]       [place], [place]  [address]       Ph: [phone]       Fax: [phone]   RxID:   [id number]    Orders Added: 1)  No Charge Patient Arrived (NCPA0) [NCPA0]

## 2010-11-08 ENCOUNTER — Encounter (HOSPITAL_BASED_OUTPATIENT_CLINIC_OR_DEPARTMENT_OTHER): Payer: PRIVATE HEALTH INSURANCE | Admitting: Oncology

## 2010-11-08 DIAGNOSIS — C4370 Malignant melanoma of unspecified lower limb, including hip: Secondary | ICD-10-CM

## 2010-11-08 DIAGNOSIS — D72819 Decreased white blood cell count, unspecified: Secondary | ICD-10-CM

## 2010-11-08 DIAGNOSIS — D696 Thrombocytopenia, unspecified: Secondary | ICD-10-CM

## 2010-11-14 ENCOUNTER — Other Ambulatory Visit: Payer: Self-pay | Admitting: *Deleted

## 2010-11-14 MED ORDER — ALPRAZOLAM 0.5 MG PO TABS: 0.5000 mg | ORAL_TABLET | Freq: Every evening | ORAL | Status: DC | PRN

## 2011-04-13 ENCOUNTER — Telehealth: Payer: Self-pay | Admitting: Internal Medicine

## 2011-04-13 MED ORDER — ALPRAZOLAM 0.5 MG PO TABS: 0.5000 mg | ORAL_TABLET | Freq: Every evening | ORAL | Status: DC | PRN

## 2011-04-13 NOTE — Telephone Encounter (Signed)
Pt requesting refill on ALPRAZolam (XANAX) 0.5 MG tablet.   Pt is scheduled for a med follow up on 04/24/11. Please Send to [facility]

## 2011-04-13 NOTE — Telephone Encounter (Signed)
rx called in

## 2011-04-17 ENCOUNTER — Other Ambulatory Visit: Payer: Self-pay | Admitting: Internal Medicine

## 2011-04-21 ENCOUNTER — Encounter: Payer: Self-pay | Admitting: Internal Medicine

## 2011-04-24 ENCOUNTER — Encounter: Payer: Self-pay | Admitting: Internal Medicine

## 2011-04-24 ENCOUNTER — Ambulatory Visit (INDEPENDENT_AMBULATORY_CARE_PROVIDER_SITE_OTHER): Payer: BC Managed Care – PPO | Admitting: Internal Medicine

## 2011-04-24 VITALS — BP 120/82 | HR 84 | Temp 98.8°F | Ht 67.0 in | Wt 218.0 lb

## 2011-04-24 DIAGNOSIS — I10 Essential (primary) hypertension: Secondary | ICD-10-CM

## 2011-04-24 DIAGNOSIS — Z23 Encounter for immunization: Secondary | ICD-10-CM

## 2011-04-24 DIAGNOSIS — E785 Hyperlipidemia, unspecified: Secondary | ICD-10-CM

## 2011-04-24 LAB — BASIC METABOLIC PANEL
CO2: 26 mEq/L (ref 19–32)
GFR: 87.74 mL/min (ref >60.00)
Glucose, Bld: 128 mg/dL — ABNORMAL HIGH (ref 70–99)
Potassium: 4.2 mEq/L (ref 3.5–5.1)
Sodium: 139 mEq/L (ref 135–145)

## 2011-04-24 MED ORDER — ALPRAZOLAM 0.5 MG PO TABS: 0.5000 mg | ORAL_TABLET | Freq: Every evening | ORAL | Status: DC | PRN

## 2011-04-24 NOTE — Assessment & Plan Note (Signed)
BP Readings from Last 3 Encounters:  04/24/11 120/82  07/13/10 114/84  04/15/10 120/80   Well controlled Continue meds

## 2011-04-24 NOTE — Assessment & Plan Note (Signed)
Long-term atorvastatin use. We'll check lipid and liver profile today. I suspect lipid panel improved based on weight loss and daily exercise.

## 2011-04-24 NOTE — Progress Notes (Signed)
  Subjective:    Patient ID: Nicholas [name], male    DOB: [birth year], 65 y.o.   MRN: [id number]  HPI  Patient Active Problem List  Diagnoses  . MALIGNANT MELANOMA SKIN LOWER LIMB INCLUDING HIP---has regular f/u with oncology  . HYPERLIPIDEMIA---tolerating meds  . HYPERTENSION---no home bps, tolerating meds  .   Marland Kitchen OBESITY---not following a diet or exercise plan    Past Medical History  Diagnosis Date  . Hyperlipidemia   . Hypertension   . Cancer Jan 2008    skin; hx of melanoma left foot/ amputation of toes 1&2    Past Surgical History  Procedure Date  . Removal of melanoma of left foot/amputation of toes 1&25 Jul 2006  . 4th toe- 2nd primary melanoma 2009    reports that he has quit smoking. He does not have any smokeless tobacco history on file. His alcohol and drug histories not on file. family history is not on file. Not on File   Review of Systems  patient denies chest pain, shortness of breath, orthopnea. Denies lower extremity edema, abdominal pain, change in appetite, change in bowel movements. Patient denies rashes, musculoskeletal complaints. No other specific complaints in a complete review of systems.      Objective:   Physical Exam   well-developed well-nourished male in no acute distress. HEENT exam atraumatic, normocephalic, neck supple without jugular venous distention. Chest clear to auscultation cardiac exam S1-S2 are regular. Abdominal exam overweight with bowel sounds, soft and nontender. Extremities no edema. Neurologic exam is alert with a normal gait.       Assessment & Plan:

## 2011-04-25 DIAGNOSIS — Z23 Encounter for immunization: Secondary | ICD-10-CM

## 2011-05-09 ENCOUNTER — Encounter (HOSPITAL_BASED_OUTPATIENT_CLINIC_OR_DEPARTMENT_OTHER): Payer: BC Managed Care – PPO | Admitting: Oncology

## 2011-05-09 DIAGNOSIS — D696 Thrombocytopenia, unspecified: Secondary | ICD-10-CM

## 2011-05-09 DIAGNOSIS — C4370 Malignant melanoma of unspecified lower limb, including hip: Secondary | ICD-10-CM

## 2011-05-09 DIAGNOSIS — R609 Edema, unspecified: Secondary | ICD-10-CM

## 2011-05-16 ENCOUNTER — Other Ambulatory Visit: Payer: Self-pay | Admitting: Internal Medicine

## 2011-08-07 ENCOUNTER — Other Ambulatory Visit: Payer: Self-pay | Admitting: Internal Medicine

## 2011-08-18 ENCOUNTER — Telehealth: Payer: Self-pay | Admitting: Internal Medicine

## 2011-08-18 MED ORDER — METOPROLOL TARTRATE 50 MG PO TABS: 50.0000 mg | ORAL_TABLET | Freq: Two times a day (BID) | ORAL | Status: DC

## 2011-08-18 NOTE — Telephone Encounter (Signed)
rx sent in electronically 

## 2011-08-18 NOTE — Telephone Encounter (Signed)
Refill Metoprolol to [facility]. Thanks.

## 2011-08-28 ENCOUNTER — Telehealth: Payer: Self-pay | Admitting: Oncology

## 2011-08-28 NOTE — Telephone Encounter (Signed)
S/w the pt and he is aware of his July 2013 appt

## 2011-09-15 ENCOUNTER — Other Ambulatory Visit: Payer: Self-pay | Admitting: Internal Medicine

## 2011-09-15 NOTE — Telephone Encounter (Signed)
Last seen 04/24/11.  Pls advise.

## 2011-09-18 ENCOUNTER — Other Ambulatory Visit: Payer: Self-pay | Admitting: Internal Medicine

## 2011-11-20 ENCOUNTER — Other Ambulatory Visit: Payer: Self-pay | Admitting: Internal Medicine

## 2011-12-11 ENCOUNTER — Other Ambulatory Visit: Payer: Self-pay | Admitting: Internal Medicine

## 2011-12-14 ENCOUNTER — Telehealth: Payer: Self-pay | Admitting: *Deleted

## 2011-12-14 NOTE — Telephone Encounter (Signed)
Xanax refill was denied cause it was to soon to refill.  Told pt that Dr Cato Mulligan did not want pt to exceed 20 pills a month.

## 2012-01-19 ENCOUNTER — Encounter: Payer: Self-pay | Admitting: Family

## 2012-01-19 ENCOUNTER — Ambulatory Visit (INDEPENDENT_AMBULATORY_CARE_PROVIDER_SITE_OTHER): Payer: Medicare Other | Admitting: Family

## 2012-01-19 VITALS — BP 122/90 | Temp 98.7°F | Wt 222.0 lb

## 2012-01-19 DIAGNOSIS — N529 Male erectile dysfunction, unspecified: Secondary | ICD-10-CM

## 2012-01-19 DIAGNOSIS — Z79899 Other long term (current) drug therapy: Secondary | ICD-10-CM

## 2012-01-19 DIAGNOSIS — R5383 Other fatigue: Secondary | ICD-10-CM

## 2012-01-19 DIAGNOSIS — K219 Gastro-esophageal reflux disease without esophagitis: Secondary | ICD-10-CM

## 2012-01-19 LAB — CBC WITH DIFFERENTIAL/PLATELET
Basophils Absolute: 0 10*3/uL (ref 0.0–0.1)
Eosinophils Relative: 5 % (ref 0.0–5.0)
Lymphocytes Relative: 26 % (ref 12.0–46.0)
Monocytes Relative: 6.4 % (ref 3.0–12.0)
Neutrophils Relative %: 62.1 % (ref 43.0–77.0)
Platelets: 164 10*3/uL (ref 150.0–400.0)
RDW: 13.7 % (ref 11.5–14.6)
WBC: 6.9 10*3/uL (ref 4.5–10.5)

## 2012-01-19 LAB — BASIC METABOLIC PANEL
CO2: 26 mEq/L (ref 19–32)
Chloride: 105 mEq/L (ref 96–112)
Potassium: 4.6 mEq/L (ref 3.5–5.1)

## 2012-01-19 LAB — TSH: TSH: 1.39 u[IU]/mL (ref 0.35–5.50)

## 2012-01-19 LAB — H. PYLORI ANTIBODY, IGG: H Pylori IgG: NEGATIVE

## 2012-01-19 LAB — VITAMIN B12: Vitamin B-12: 321 pg/mL (ref 211–911)

## 2012-01-19 MED ORDER — OMEPRAZOLE 40 MG PO CPDR: 40.0000 mg | DELAYED_RELEASE_CAPSULE | Freq: Every day | ORAL | Status: DC

## 2012-01-19 NOTE — Progress Notes (Signed)
Subjective:    Patient ID: Nicholas [name], male    DOB: 02/20/1946, 66 y.o.   MRN: [id number]  HPI 66 year old white male, nonsmoker, patient of Dr. Cato Mulligan is in today with complaints of indigestion that occurs about once a week. His wife says that he complains about once a day. His symptoms are typically relieved with Tums. Symptoms typically occur at night. Denies any increase in bloating, burping, abdominal pain, or back pain.  Patient also has complaints of feeling more fatigued over the last one to 2 months. He has difficulty achieving an erection, but has a great sex drive.   Review of Systems  Constitutional: Positive for fatigue.  HENT: Negative.   Eyes: Negative.   Respiratory: Negative.   Gastrointestinal: Positive for abdominal pain. Negative for nausea and vomiting.  Genitourinary: Negative.  Negative for discharge and penile swelling.  Musculoskeletal: Negative.   Skin: Negative.   Neurological: Negative.   Hematological: Negative.   Psychiatric/Behavioral: Negative.    Past Medical History  Diagnosis Date  . Hyperlipidemia   . Hypertension   . Cancer Jan 2008    skin; hx of melanoma left foot/ amputation of toes 1&2     History   Social History  . Marital Status: Married    Spouse Name: N/A    Number of Children: N/A  . Years of Education: N/A   Occupational History  . Not on file.   Social History Main Topics  . Smoking status: Former Games developer  . Smokeless tobacco: Not on file  . Alcohol Use: Not on file  . Drug Use: Not on file  . Sexually Active: Not on file   Other Topics Concern  . Not on file   Social History Narrative  . No narrative on file    Past Surgical History  Procedure Date  . Removal of melanoma of left foot/amputation of toes 1&25 Jul 2006  . 4th toe- 2nd primary melanoma 2009    No family history on file.  No Known Allergies  Current Outpatient Prescriptions on File Prior to Visit  Medication Sig Dispense Refill  .  CRESTOR 10 MG tablet TAKE 1 TABLET ONCE DAILY.  30 each  5  . metoprolol (LOPRESSOR) 50 MG tablet Take 1 tablet (50 mg total) by mouth 2 (two) times daily.  30 tablet  5  . XANAX 0.5 MG tablet TAKE 1 TABLET AT BEDTIME AS NEEDED.  20 each  0  . omeprazole (PRILOSEC) 40 MG capsule Take 1 capsule (40 mg total) by mouth daily.  30 capsule  3    BP 122/90  Temp 98.7 F (37.1 C) (Oral)  Wt 222 lb (100.699 kg)chart    Objective:   Physical Exam  Constitutional: He is oriented to person, place, and time. He appears well-developed and well-nourished.  HENT:  Right Ear: External ear normal.  Left Ear: External ear normal.  Nose: Nose normal.  Mouth/Throat: Oropharynx is clear and moist.  Neck: Neck supple.  Cardiovascular: Normal rate, regular rhythm and normal heart sounds.   Pulmonary/Chest: Effort normal.  Abdominal: Soft. Bowel sounds are normal.  Musculoskeletal: Normal range of motion.  Neurological: He is alert and oriented to person, place, and time.  Skin: Skin is warm and dry.  Psychiatric: He has a normal mood and affect.          Assessment & Plan:  Assessment: Fatigue, GERD, erectile dysfunction  Plan: Lab sent to include B12, testosterone, CBC, and TSH patient and the results. Start  omeprazole 40 mg once daily. GERD and peptic ulcer diet provided. Patient on the office symptoms worsen or persist. Recheck with PCP as scheduled and sooner when necessary.

## 2012-01-19 NOTE — Patient Instructions (Signed)
Diet for GERD or PUD Nutrition therapy can help ease the discomfort of gastroesophageal reflux disease (GERD) and peptic ulcer disease (PUD).  HOME CARE INSTRUCTIONS   Eat your meals slowly, in a relaxed setting.   Eat 5 to 6 small meals per day.   If a food causes distress, stop eating it for a period of time.  FOODS TO AVOID  Coffee, regular or decaffeinated.   Cola beverages, regular or low calorie.   Tea, regular or decaffeinated.   Pepper.   Cocoa.   High fat foods, including meats.   Butter, margarine, hydrogenated oil (trans fats).   Peppermint or spearmint (if you have GERD).   Fruits and vegetables if not tolerated.   Alcohol.   Nicotine (smoking or chewing). This is one of the most potent stimulants to acid production in the gastrointestinal tract.   Any food that seems to aggravate your condition.  If you have questions regarding your diet, ask your caregiver or a registered dietitian. TIPS  Lying flat may make symptoms worse. Keep the head of your bed raised 6 to 9 inches (15 to 23 cm) by using a foam wedge or blocks under the legs of the bed.   Do not lay down until 3 hours after eating a meal.   Daily physical activity may help reduce symptoms.  MAKE SURE YOU:   Understand these instructions.   Will watch your condition.   Will get help right away if you are not doing well or get worse.  Document Released: 07/10/2005 Document Revised: 06/29/2011 Document Reviewed: 05/26/2011 [facility] Patient Information 2012 [place], [place].   Fatigue Fatigue is a feeling of tiredness, lack of energy, lack of motivation, or feeling tired all the time. Having enough rest, good nutrition, and reducing stress will normally reduce fatigue. Consult your caregiver if it persists. The nature of your fatigue will help your caregiver to find out its cause. The treatment is based on the cause.  CAUSES  There are many causes for fatigue. Most of the time, fatigue can be  traced to one or more of your habits or routines. Most causes fit into one or more of three general areas. They are: Lifestyle problems  Sleep disturbances.   Overwork.   Physical exertion.   Unhealthy habits.   Poor eating habits or eating disorders.   Alcohol and/or drug use .   Lack of proper nutrition (malnutrition).  Psychological problems  Stress and/or anxiety problems.   Depression.   Grief.   Boredom.  Medical Problems or Conditions  Anemia.   Pregnancy.   Thyroid gland problems.   Recovery from major surgery.   Continuous pain.   Emphysema or asthma that is not well controlled   Allergic conditions.   Diabetes.   Infections (such as mononucleosis).   Obesity.   Sleep disorders, such as sleep apnea.   Heart failure or other heart-related problems.   Cancer.   Kidney disease.   Liver disease.   Effects of certain medicines such as antihistamines, cough and cold remedies, prescription pain medicines, heart and blood pressure medicines, drugs used for treatment of cancer, and some antidepressants.  SYMPTOMS  The symptoms of fatigue include:   Lack of energy.   Lack of drive (motivation).   Drowsiness.   Feeling of indifference to the surroundings.  DIAGNOSIS  The details of how you feel help guide your caregiver in finding out what is causing the fatigue. You will be asked about your present and past health  condition. It is important to review all medicines that you take, including prescription and non-prescription items. A thorough exam will be done. You will be questioned about your feelings, habits, and normal lifestyle. Your caregiver may suggest blood tests, urine tests, or other tests to look for common medical causes of fatigue.  TREATMENT  Fatigue is treated by correcting the underlying cause. For example, if you have continuous pain or depression, treating these causes will improve how you feel. Similarly, adjusting the dose of  certain medicines will help in reducing fatigue.  HOME CARE INSTRUCTIONS   Try to get the required amount of good sleep every night.   Eat a healthy and nutritious diet, and drink enough water throughout the day.   Practice ways of relaxing (including yoga or meditation).   Exercise regularly.   Make plans to change situations that cause stress. Act on those plans so that stresses decrease over time. Keep your work and personal routine reasonable.   Avoid street drugs and minimize use of alcohol.   Start taking a daily multivitamin after consulting your caregiver.  SEEK MEDICAL CARE IF:   You have persistent tiredness, which cannot be accounted for.   You have fever.   You have unintentional weight loss.   You have headaches.   You have disturbed sleep throughout the night.   You are feeling sad.   You have constipation.   You have dry skin.   You have gained weight.   You are taking any new or different medicines that you suspect are causing fatigue.   You are unable to sleep at night.   You develop any unusual swelling of your legs or other parts of your body.  SEEK IMMEDIATE MEDICAL CARE IF:   You are feeling confused.   Your vision is blurred.   You feel faint or pass out.   You develop severe headache.   You develop severe abdominal, pelvic, or back pain.   You develop chest pain, shortness of breath, or an irregular or fast heartbeat.   You are unable to pass a normal amount of urine.   You develop abnormal bleeding such as bleeding from the rectum or you vomit blood.   You have thoughts about harming yourself or committing suicide.   You are worried that you might harm someone else.  MAKE SURE YOU:   Understand these instructions.   Will watch your condition.   Will get help right away if you are not doing well or get worse.  Document Released: 05/07/2007 Document Revised: 06/29/2011 Document Reviewed: 05/07/2007 [facility] Patient  Information 2012 [place], [place].

## 2012-01-22 LAB — TESTOSTERONE, FREE, TOTAL, SHBG
Testosterone, Free: 51.3 pg/mL (ref 47.0–244.0)
Testosterone-% Free: 1.9 % (ref 1.6–2.9)
Testosterone: 273.01 ng/dL — ABNORMAL LOW (ref 300–890)

## 2012-01-29 ENCOUNTER — Other Ambulatory Visit: Payer: Self-pay | Admitting: Internal Medicine

## 2012-02-06 ENCOUNTER — Ambulatory Visit: Payer: BC Managed Care – PPO | Admitting: Oncology

## 2012-02-13 ENCOUNTER — Telehealth: Payer: Self-pay | Admitting: *Deleted

## 2012-02-13 NOTE — Telephone Encounter (Signed)
Called pt to follow up on missed appt 02/06/12. He reports he recently retired and kept all appts organized at his office. Would like to reschedule. Request sent to schedulers.

## 2012-02-14 ENCOUNTER — Telehealth: Payer: Self-pay | Admitting: Oncology

## 2012-02-14 NOTE — Telephone Encounter (Signed)
lmonvm adviisng the pt of his aug appt that has been r/s

## 2012-02-19 ENCOUNTER — Other Ambulatory Visit: Payer: Self-pay | Admitting: Internal Medicine

## 2012-03-15 ENCOUNTER — Ambulatory Visit (HOSPITAL_BASED_OUTPATIENT_CLINIC_OR_DEPARTMENT_OTHER): Payer: Medicare Other | Admitting: Oncology

## 2012-03-15 VITALS — BP 133/92 | HR 71 | Temp 98.6°F | Resp 18 | Ht 67.0 in | Wt 219.8 lb

## 2012-03-15 DIAGNOSIS — C4370 Malignant melanoma of unspecified lower limb, including hip: Secondary | ICD-10-CM

## 2012-03-15 DIAGNOSIS — R609 Edema, unspecified: Secondary | ICD-10-CM

## 2012-03-15 NOTE — Progress Notes (Signed)
   [facility]    OFFICE PROGRESS NOTE   INTERVAL HISTORY:   He returns as scheduled. No new complaint. Good appetite and energy level. No change at the left foot amputation sites. He reports swelling at the right lower leg and foot for the past year. No pain.  Objective:  Vital signs in last 24 hours:  Blood pressure 133/92, pulse 71, temperature 98.6 F (37 C), temperature source Oral, resp. rate 18, height 5\' 7"  (1.702 m), weight 219 lb 12.8 oz (99.701 kg).    HEENT: Neck without mass Lymphatics: No cervical, supraclavicular, axillary, or inguinal nodes Resp: Lungs clear bilaterally Cardio: Regular rate and rhythm GI: No hepatosplenomegaly, nontender, no mass Vascular: Trace to 1+ edema at the right lower leg and foot, trace edema at the left lower leg. No erythema.  Skin: Left groin scar, left popliteal scar, and  left foot amputation site without evidence of recurrent tumor. Bilateral groin yeast rash.    Medications: I have reviewed the patient's current medications.  Assessment/Plan: 1. 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 2. 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 3. Left leg edema, stable. He uses a compression stocking intermittently. 4. History of dizziness, anorexia, and weight loss secondary to interferon, resolved. 5. A 1.2 cm precarinal lymph node was seen on a PET scan in December 2007 without associated increased FDG activity.  A PET scan 09/16/2007 revealed no evidence for recurrent melanoma. 6. 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 7. Right low leg/ankle edema:  Present for the past year, etiology unclear.   Disposition:  He remains in clinical remission from the melanoma. He is followed by Dr. Yetta Barre. Mr. Pizzi will return  for an office visit in one year.  He plans to followup with Dr. Cato Mulligan in the near future to address the right leg edema.   Thornton Papas, MD  03/15/2012  12:07 PM

## 2012-03-20 ENCOUNTER — Telehealth: Payer: Self-pay | Admitting: Oncology

## 2012-03-20 NOTE — Telephone Encounter (Signed)
lmonvm for pt re appt for 03/14/2013 and mailed schedule.

## 2012-04-04 ENCOUNTER — Other Ambulatory Visit: Payer: Self-pay | Admitting: *Deleted

## 2012-04-04 MED ORDER — ALPRAZOLAM 0.5 MG PO TABS: 0.5000 mg | ORAL_TABLET | Freq: Every evening | ORAL | Status: DC | PRN

## 2012-04-09 ENCOUNTER — Telehealth: Payer: Self-pay | Admitting: Internal Medicine

## 2012-04-09 NOTE — Telephone Encounter (Signed)
Pt had to rsc his 05/08/12 cpx because pcp is out of ov that day. Pt was rsc to 06/03/12 and will need to get another refill of ALPRAZolam (XANAX) 0.5 MG tablet when the med runs. Pt wants to be sure that a refill will be authorized since pt was rscd due to pcp avail.

## 2012-04-10 MED ORDER — ALPRAZOLAM 0.5 MG PO TABS: 0.5000 mg | ORAL_TABLET | Freq: Every evening | ORAL | Status: DC | PRN

## 2012-04-10 NOTE — Telephone Encounter (Signed)
Ok per Dr Swords, rx called in 

## 2012-04-24 ENCOUNTER — Other Ambulatory Visit (INDEPENDENT_AMBULATORY_CARE_PROVIDER_SITE_OTHER): Payer: Medicare Other

## 2012-04-24 DIAGNOSIS — Z79899 Other long term (current) drug therapy: Secondary | ICD-10-CM

## 2012-04-24 DIAGNOSIS — Z125 Encounter for screening for malignant neoplasm of prostate: Secondary | ICD-10-CM

## 2012-04-24 DIAGNOSIS — Z0000 Encounter for general adult medical examination without abnormal findings: Secondary | ICD-10-CM

## 2012-04-24 DIAGNOSIS — Z23 Encounter for immunization: Secondary | ICD-10-CM

## 2012-04-24 LAB — CBC WITH DIFFERENTIAL/PLATELET
Eosinophils Relative: 4.4 % (ref 0.0–5.0)
Lymphocytes Relative: 26.9 % (ref 12.0–46.0)
MCV: 95.6 fl (ref 78.0–100.0)
Monocytes Absolute: 0.5 10*3/uL (ref 0.1–1.0)
Neutrophils Relative %: 60.6 % (ref 43.0–77.0)
Platelets: 160 10*3/uL (ref 150.0–400.0)
WBC: 6.5 10*3/uL (ref 4.5–10.5)

## 2012-04-24 LAB — BASIC METABOLIC PANEL
BUN: 23 mg/dL (ref 6–23)
Calcium: 9.3 mg/dL (ref 8.4–10.5)
Creatinine, Ser: 1 mg/dL (ref 0.4–1.5)
GFR: 80.37 mL/min (ref >60.00)

## 2012-04-24 LAB — POCT URINALYSIS DIPSTICK
Bilirubin, UA: NEGATIVE
Ketones, UA: NEGATIVE
Leukocytes, UA: NEGATIVE
pH, UA: 6

## 2012-04-24 LAB — HEPATIC FUNCTION PANEL
ALT: 34 U/L (ref 0–53)
Alkaline Phosphatase: 54 U/L (ref 39–117)
Bilirubin, Direct: 0.1 mg/dL (ref 0.0–0.3)
Total Bilirubin: 0.6 mg/dL (ref 0.3–1.2)

## 2012-04-24 LAB — LIPID PANEL
Cholesterol: 136 mg/dL (ref 0–200)
HDL: 41.5 mg/dL (ref >39.00)
VLDL: 19.8 mg/dL (ref 0.0–40.0)

## 2012-05-08 ENCOUNTER — Encounter: Payer: Medicare Other | Admitting: Internal Medicine

## 2012-06-03 ENCOUNTER — Encounter: Payer: Medicare Other | Admitting: Internal Medicine

## 2012-07-31 ENCOUNTER — Other Ambulatory Visit: Payer: Self-pay | Admitting: Internal Medicine

## 2012-08-02 ENCOUNTER — Encounter: Payer: Medicare Other | Admitting: Internal Medicine

## 2012-08-02 ENCOUNTER — Encounter: Payer: Self-pay | Admitting: Internal Medicine

## 2012-08-02 ENCOUNTER — Ambulatory Visit (INDEPENDENT_AMBULATORY_CARE_PROVIDER_SITE_OTHER): Payer: Medicare Other | Admitting: Internal Medicine

## 2012-08-02 VITALS — BP 136/94 | HR 72 | Temp 98.3°F | Ht 67.0 in | Wt 220.0 lb

## 2012-08-02 DIAGNOSIS — R739 Hyperglycemia, unspecified: Secondary | ICD-10-CM

## 2012-08-02 DIAGNOSIS — R5383 Other fatigue: Secondary | ICD-10-CM

## 2012-08-02 DIAGNOSIS — R5381 Other malaise: Secondary | ICD-10-CM

## 2012-08-02 DIAGNOSIS — Z23 Encounter for immunization: Secondary | ICD-10-CM

## 2012-08-02 DIAGNOSIS — R7309 Other abnormal glucose: Secondary | ICD-10-CM

## 2012-08-02 DIAGNOSIS — Z0000 Encounter for general adult medical examination without abnormal findings: Secondary | ICD-10-CM

## 2012-08-02 MED ORDER — NYSTATIN 100000 UNIT/GM EX OINT: TOPICAL_OINTMENT | Freq: Two times a day (BID) | CUTANEOUS | Status: DC

## 2012-08-04 NOTE — Progress Notes (Signed)
Patient ID: Nicholas [name], male   DOB: 1946/01/31, 67 y.o.   MRN: [id number] cpx  Past Medical History  Diagnosis Date  . Hyperlipidemia   . Hypertension   . Cancer Jan 2008    skin; hx of melanoma left foot/ amputation of toes 1&2     History   Social History  . Marital Status: Married    Spouse Name: N/A    Number of Children: N/A  . Years of Education: N/A   Occupational History  . Not on file.   Social History Main Topics  . Smoking status: Former Games developer  . Smokeless tobacco: Not on file  . Alcohol Use: Not on file  . Drug Use: Not on file  . Sexually Active: Not on file   Other Topics Concern  . Not on file   Social History Narrative  . No narrative on file    Past Surgical History  Procedure Date  . Removal of melanoma of left foot/amputation of toes 1&25 Jul 2006  . 4th toe- 2nd primary melanoma 2009    No family history on file.  No Known Allergies  Current Outpatient Prescriptions on File Prior to Visit  Medication Sig Dispense Refill  . CRESTOR 10 MG tablet TAKE 1 TABLET ONCE DAILY.  30 tablet  0  . metoprolol (LOPRESSOR) 50 MG tablet Take 1 tablet (50 mg total) by mouth 2 (two) times daily.  30 tablet  5     patient denies chest pain, shortness of breath, orthopnea. Denies lower extremity edema, abdominal pain, change in appetite, change in bowel movements. Patient denies rashes, musculoskeletal complaints. No other specific complaints in a complete review of systems except ED-- he tells me he has had a low testosterone level  BP 136/94  Pulse 72  Temp 98.3 F (36.8 C) (Oral)  Ht 5\' 7"  (1.702 m)  Wt 220 lb (99.791 kg)  BMI 34.46 kg/m2 Well-developed male in no acute distress. HEENT exam atraumatic, normocephalic, extraocular muscles are intact. Conjunctivae are pink without exudate. Neck is supple without lymphadenopathy, thyromegaly, jugular venous distention. Chest is clear to auscultation without increased work of breathing. Cardiac exam S1-S2  are regular. The PMI is normal. No significant murmurs or gallops. Abdominal exam active bowel sounds, soft, nontender. No abdominal bruits. Extremities no clubbing cyanosis or edema. Peripheral pulses are normal without bruits. Neurologic exam alert and oriented without any motor or sensory deficits. Rectal exam normal tone prostate normal size without masses or asymmetry.   Well visit- health miant utd He needs to lose weight-- suspect that obesity is related to known "low testosterone" Will recheck today

## 2012-08-05 NOTE — Addendum Note (Signed)
Addended by: Alfred Levins D on: 08/05/2012 08:57 AM   Modules accepted: Orders

## 2012-09-02 ENCOUNTER — Other Ambulatory Visit: Payer: Self-pay | Admitting: Internal Medicine

## 2012-09-16 ENCOUNTER — Other Ambulatory Visit: Payer: Self-pay | Admitting: Internal Medicine

## 2012-12-24 ENCOUNTER — Other Ambulatory Visit: Payer: Self-pay | Admitting: Dermatology

## 2013-03-14 ENCOUNTER — Ambulatory Visit: Payer: Medicare Other | Admitting: Oncology

## 2013-05-19 ENCOUNTER — Ambulatory Visit (INDEPENDENT_AMBULATORY_CARE_PROVIDER_SITE_OTHER): Payer: Medicare Other

## 2013-05-19 DIAGNOSIS — Z23 Encounter for immunization: Secondary | ICD-10-CM

## 2013-06-17 ENCOUNTER — Other Ambulatory Visit: Payer: Self-pay | Admitting: *Deleted

## 2013-06-17 MED ORDER — METOPROLOL TARTRATE 50 MG PO TABS: ORAL_TABLET | ORAL | Status: DC

## 2013-08-19 ENCOUNTER — Ambulatory Visit (INDEPENDENT_AMBULATORY_CARE_PROVIDER_SITE_OTHER): Payer: Medicare HMO | Admitting: Family Medicine

## 2013-08-19 ENCOUNTER — Encounter: Payer: Self-pay | Admitting: Family Medicine

## 2013-08-19 VITALS — BP 110/80 | Temp 98.7°F | Wt 217.0 lb

## 2013-08-19 DIAGNOSIS — J111 Influenza due to unidentified influenza virus with other respiratory manifestations: Secondary | ICD-10-CM

## 2013-08-19 DIAGNOSIS — R69 Illness, unspecified: Principal | ICD-10-CM

## 2013-08-19 NOTE — Patient Instructions (Signed)
INSTRUCTIONS FOR UPPER RESPIRATORY INFECTION:  -plenty of rest and fluids  -nasal saline wash 2-3 times daily (use prepackaged nasal saline or bottled/distilled water if making your own)   -can use sinex nasal spray for drainage and nasal congestion - but do NOT use longer then 3-4 days  -can use tylenol or ibuprofen as directed for aches and sorethroat  -in the winter time, using a humidifier at night is helpful (please follow cleaning instructions)  -if you are taking a cough medication - use only as directed, may also try a teaspoon of honey to coat the throat and throat lozenges  -for sore throat, salt water gargles can help  -follow up if you have fevers, facial pain, tooth pain, difficulty breathing or are worsening or not getting better in 5-7 days

## 2013-08-19 NOTE — Progress Notes (Signed)
Pre visit review using our clinic review tool, if applicable. No additional management support is needed unless otherwise documented below in the visit note. 

## 2013-08-19 NOTE — Progress Notes (Signed)
Chief Complaint  Patient presents with  . Fever    watery eyes, runnynose, sneexing, cough, chest congestion, chest hurts when coughing since Saturday     HPI:  -started: 3-4 days ago -symptoms: see above -denies:fever, SOB, NVD, tooth pain -has tried: advil, delsym -now feeling better  ROS: See pertinent positives and negatives per HPI.  Past Medical History  Diagnosis Date  . Hyperlipidemia   . Hypertension   . 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 Removal of melanoma of left foot/amputation of toes 1&25 Jul 2006  . 4th toe- 2nd primary melanoma  2009    No family history on file.  History   Social History  . Marital Status: Married    Spouse Name: N/A    Number of Children: N/A  . Years of Education: N/A   Social History Main Topics  . Smoking status: Former Research scientist (life sciences)  . Smokeless tobacco: None  . Alcohol Use: None  . Drug Use: None  . Sexual Activity: None   Other Topics Concern  . None   Social History Narrative  . None    Current outpatient prescriptions:CRESTOR 10 MG tablet, TAKE 1 TABLET ONCE DAILY., Disp: 30 tablet, Rfl: 11;  metoprolol (LOPRESSOR) 50 MG tablet, TAKE 1 TABLET TWICE DAILY., Disp: 60 tablet, Rfl: 3;  nystatin ointment (MYCOSTATIN), Apply topically 2 (two) times daily., Disp: 60 g, Rfl: 1  EXAM:  Filed Vitals:   08/19/13 1506  BP: 110/80  Temp: 98.7 F (37.1 C)    Body mass index is 33.98 kg/(m^2).  GENERAL: vitals reviewed and listed above, alert, oriented, appears well hydrated and in no acute distress  HEENT: atraumatic, conjunttiva clear, no obvious abnormalities on inspection of external nose and ears, normal appearance of ear canals and TMs, clear nasal congestion, mild post oropharyngeal erythema with PND, no tonsillar edema or exudate, no sinus TTP  NECK: no obvious masses on inspection  LUNGS: clear to auscultation  bilaterally, no wheezes, rales or rhonchi, good air movement  CV: HRRR, no peripheral edema  MS: moves all extremities without noticeable abnormality  PSYCH: pleasant and cooperative, no obvious depression or anxiety  ASSESSMENT AND PLAN:  Discussed the following assessment and plan:  Influenza-like illness  -given HPI and exam findings today, a serious infection or illness is unlikely. We discussed potential etiologies, with VURI being most likely, and advised supportive care and monitoring. We discussed treatment side effects, likely course, antibiotic misuse, transmission, and signs of developing a serious illness. -influenza possible but improving, mild symptoms today and out of treatment window for likely benefit from tamiflu - after this discussion opted for supportive care -of course, we advised to return or notify a doctor immediately if symptoms worsen or persist or new concerns arise.    Patient Instructions  INSTRUCTIONS FOR UPPER RESPIRATORY INFECTION:  -plenty of rest and fluids  -nasal saline wash 2-3 times daily (use prepackaged nasal saline or bottled/distilled water if making your own)   -can use sinex nasal spray for drainage and nasal congestion - but do NOT use longer then 3-4 days  -can use tylenol or ibuprofen as directed for aches and sorethroat  -in the winter time, using a humidifier at night is helpful (please follow cleaning instructions)  -if you are taking a cough medication - use only as directed, may also try a teaspoon of honey  to coat the throat and throat lozenges  -for sore throat, salt water gargles can help  -follow up if you have fevers, facial pain, tooth pain, difficulty breathing or are worsening or not getting better in 5-7 days      Jalan Bodi R.

## 2013-08-25 ENCOUNTER — Encounter: Payer: Self-pay | Admitting: Internal Medicine

## 2013-08-25 ENCOUNTER — Ambulatory Visit (INDEPENDENT_AMBULATORY_CARE_PROVIDER_SITE_OTHER): Payer: Medicare HMO | Admitting: Internal Medicine

## 2013-08-25 VITALS — BP 156/98 | HR 72 | Temp 98.6°F | Ht 66.5 in | Wt 217.0 lb

## 2013-08-25 DIAGNOSIS — E785 Hyperlipidemia, unspecified: Secondary | ICD-10-CM

## 2013-08-25 DIAGNOSIS — Z125 Encounter for screening for malignant neoplasm of prostate: Secondary | ICD-10-CM

## 2013-08-25 DIAGNOSIS — E669 Obesity, unspecified: Secondary | ICD-10-CM

## 2013-08-25 DIAGNOSIS — I10 Essential (primary) hypertension: Secondary | ICD-10-CM

## 2013-08-25 DIAGNOSIS — R739 Hyperglycemia, unspecified: Secondary | ICD-10-CM

## 2013-08-25 DIAGNOSIS — R7309 Other abnormal glucose: Secondary | ICD-10-CM

## 2013-08-25 DIAGNOSIS — Z0000 Encounter for general adult medical examination without abnormal findings: Secondary | ICD-10-CM

## 2013-08-25 LAB — HEPATIC FUNCTION PANEL
ALBUMIN: 4 g/dL (ref 3.5–5.2)
ALT: 39 U/L (ref 0–53)
AST: 26 U/L (ref 0–37)
Alkaline Phosphatase: 74 U/L (ref 39–117)
Bilirubin, Direct: 0.1 mg/dL (ref 0.0–0.3)
Total Bilirubin: 0.5 mg/dL (ref 0.3–1.2)
Total Protein: 6.7 g/dL (ref 6.0–8.3)

## 2013-08-25 LAB — BASIC METABOLIC PANEL
BUN: 23 mg/dL (ref 6–23)
CHLORIDE: 104 meq/L (ref 96–112)
CO2: 29 meq/L (ref 19–32)
Calcium: 9.5 mg/dL (ref 8.4–10.5)
Creatinine, Ser: 1 mg/dL (ref 0.4–1.5)
GFR: 80.99 mL/min (ref >60.00)
Glucose, Bld: 103 mg/dL — ABNORMAL HIGH (ref 70–99)
Potassium: 4.7 mEq/L (ref 3.5–5.1)
SODIUM: 141 meq/L (ref 135–145)

## 2013-08-25 LAB — CBC WITH DIFFERENTIAL/PLATELET
BASOS PCT: 0.4 % (ref 0.0–3.0)
Basophils Absolute: 0 10*3/uL (ref 0.0–0.1)
EOS PCT: 3.9 % (ref 0.0–5.0)
Eosinophils Absolute: 0.3 10*3/uL (ref 0.0–0.7)
HEMATOCRIT: 48.6 % (ref 39.0–52.0)
HEMOGLOBIN: 15.7 g/dL (ref 13.0–17.0)
LYMPHS ABS: 1.8 10*3/uL (ref 0.7–4.0)
LYMPHS PCT: 26.6 % (ref 12.0–46.0)
MCHC: 32.3 g/dL (ref 30.0–36.0)
MCV: 96.4 fl (ref 78.0–100.0)
MONOS PCT: 6.2 % (ref 3.0–12.0)
Monocytes Absolute: 0.4 10*3/uL (ref 0.1–1.0)
NEUTROS ABS: 4.2 10*3/uL (ref 1.4–7.7)
Neutrophils Relative %: 62.9 % (ref 43.0–77.0)
Platelets: 206 10*3/uL (ref 150.0–400.0)
RBC: 5.05 Mil/uL (ref 4.22–5.81)
RDW: 13.2 % (ref 11.5–14.6)
WBC: 6.7 10*3/uL (ref 4.5–10.5)

## 2013-08-25 LAB — POCT URINALYSIS DIPSTICK
Glucose, UA: NEGATIVE
KETONES UA: NEGATIVE
LEUKOCYTES UA: NEGATIVE
Nitrite, UA: NEGATIVE
Spec Grav, UA: >=1.03
Urobilinogen, UA: 0.2
pH, UA: 5.5

## 2013-08-25 LAB — LIPID PANEL
Cholesterol: 155 mg/dL (ref 0–200)
HDL: 38.3 mg/dL — ABNORMAL LOW (ref >39.00)
LDL CALC: 90 mg/dL (ref 0–99)
Total CHOL/HDL Ratio: 4
Triglycerides: 136 mg/dL (ref 0.0–149.0)
VLDL: 27.2 mg/dL (ref 0.0–40.0)

## 2013-08-25 LAB — PSA: PSA: 0.58 ng/mL (ref 0.10–4.00)

## 2013-08-25 LAB — TSH: TSH: 1.6 u[IU]/mL (ref 0.35–5.50)

## 2013-08-25 LAB — HEMOGLOBIN A1C: HEMOGLOBIN A1C: 5.8 % (ref 4.6–6.5)

## 2013-08-25 NOTE — Progress Notes (Signed)
Pre visit review using our clinic review tool, if applicable. No additional management support is needed unless otherwise documented below in the visit note. 

## 2013-08-25 NOTE — Assessment & Plan Note (Signed)
He will monitor at home

## 2013-08-25 NOTE — Progress Notes (Signed)
cpx  Past Medical History  Diagnosis Date  . Hyperlipidemia   . Hypertension   . Cancer Jan 2008    skin; hx of melanoma left foot/ amputation of toes 1&2     History   Social History  . Marital Status: Married    Spouse Name: N/A    Number of Children: N/A  . Years of Education: N/A   Occupational History  . Not on file.   Social History Main Topics  . Smoking status: Former Research scientist (life sciences)  . Smokeless tobacco: Not on file  . Alcohol Use: Not on file  . Drug Use: Not on file  . Sexual Activity: Not on file   Other Topics Concern  . Not on file   Social History Narrative  . No narrative on file    Past Surgical History  Procedure Laterality Date  . Removal of melanoma of left foot/amputation of toes 1&25 Jul 2006  . 4th toe- 2nd primary melanoma  2009    No family history on file.  No Known Allergies  Current Outpatient Prescriptions on File Prior to Visit  Medication Sig Dispense Refill  . CRESTOR 10 MG tablet TAKE 1 TABLET ONCE DAILY.  30 tablet  11  . metoprolol (LOPRESSOR) 50 MG tablet TAKE 1 TABLET TWICE DAILY.  60 tablet  3   No current facility-administered medications on file prior to visit.     patient denies chest pain, shortness of breath, orthopnea. Denies lower extremity edema, abdominal pain, change in appetite, change in bowel movements. Patient denies rashes, musculoskeletal complaints. No other specific complaints in a complete review of systems.   BP 156/98  Pulse 72  Temp(Src) 98.6 F (37 C) (Oral)  Ht 5' 6.5" (1.689 m)  Wt 217 lb (98.431 kg)  BMI 34.50 kg/m2  well-developed well-nourished male in no acute distress. HEENT exam atraumatic, normocephalic, neck supple without jugular venous distention. Chest clear to auscultation cardiac exam S1-S2 are regular. Abdominal exam overweight with bowel sounds, soft and nontender. Extremities no edema. Neurologic exam is alert with a normal gait. Rectal- normal tone- modestly enlarged prostate  Well  visit- health maint UTD Melanoma- has regular f/u with oncology

## 2013-08-25 NOTE — Assessment & Plan Note (Signed)
Need sf/u check labs including a1c

## 2013-08-25 NOTE — Assessment & Plan Note (Signed)
Check labs today.

## 2013-08-26 ENCOUNTER — Telehealth: Payer: Self-pay | Admitting: Internal Medicine

## 2013-08-26 NOTE — Telephone Encounter (Signed)
Relevant patient education mailed to patient.  

## 2013-08-26 NOTE — Assessment & Plan Note (Signed)
Discussed need for aggressive weight loss.

## 2013-09-25 ENCOUNTER — Other Ambulatory Visit: Payer: Self-pay | Admitting: Internal Medicine

## 2013-12-25 ENCOUNTER — Other Ambulatory Visit: Payer: Self-pay | Admitting: Internal Medicine

## 2014-02-12 ENCOUNTER — Telehealth: Payer: Self-pay | Admitting: Internal Medicine

## 2014-02-12 NOTE — Telephone Encounter (Signed)
Patient Information:  Caller Name: Lorelee New  Phone: [phone]  Patient: Nicholas [name], Nicholas [name]  Gender: Male  DOB: 1946-05-16  Age: 68 Years  PCP: Phoebe Sharps (Adults only, leaving end of July 2015)  Office Follow Up:  Does the office need to follow up with this patient?: No  Instructions For The Office: N/A  RN Note:  Right Leg Swelling, Bluish Color, onset 2 weeks. Right Leg swelling starting at Knee down to Foot, blue in color.   Wife denies warmth, redness, SOB or Cough; swelling is double in size compared to Left Leg, trouble walking.  Leg pain protocol used, ED dispo d/t entire foot is blue in comparison to other side.  Consulted w/ Lars Mage, CNA at office, ok to send to ED.  Wife verbalized understanding.  Symptoms  Reason For Call & Symptoms: Right Leg Swelling, Bluish Color, onset 2 weeks  Reviewed Health History In EMR: Yes  Reviewed Medications In EMR: Yes  Reviewed Allergies In EMR: Yes  Reviewed Surgeries / Procedures: Yes  Date of Onset of Symptoms: 01/29/2014  Guideline(s) Used:  Leg Pain  Disposition Per Guideline:   Go to ED Now  Reason For Disposition Reached:   Entire foot is cool or blue in comparison to other side  Advice Given:  N/A  Patient Will Follow Care Advice:  YES

## 2014-02-12 NOTE — Telephone Encounter (Signed)
Noted  

## 2014-02-20 ENCOUNTER — Ambulatory Visit (INDEPENDENT_AMBULATORY_CARE_PROVIDER_SITE_OTHER): Payer: Medicare HMO | Admitting: Family Medicine

## 2014-02-20 ENCOUNTER — Encounter: Payer: Self-pay | Admitting: Family Medicine

## 2014-02-20 VITALS — BP 130/88 | HR 67 | Temp 98.4°F | Ht 66.5 in | Wt 220.0 lb

## 2014-02-20 DIAGNOSIS — R609 Edema, unspecified: Secondary | ICD-10-CM

## 2014-02-20 DIAGNOSIS — I10 Essential (primary) hypertension: Secondary | ICD-10-CM

## 2014-02-20 DIAGNOSIS — I872 Venous insufficiency (chronic) (peripheral): Secondary | ICD-10-CM | POA: Insufficient documentation

## 2014-02-20 LAB — COMPREHENSIVE METABOLIC PANEL
ALBUMIN: 3.9 g/dL (ref 3.5–5.2)
ALT: 34 U/L (ref 0–53)
AST: 20 U/L (ref 0–37)
Alkaline Phosphatase: 60 U/L (ref 39–117)
BUN: 23 mg/dL (ref 6–23)
CALCIUM: 9.1 mg/dL (ref 8.4–10.5)
CHLORIDE: 104 meq/L (ref 96–112)
CO2: 31 mEq/L (ref 19–32)
Creatinine, Ser: 1.1 mg/dL (ref 0.4–1.5)
GFR: 73.07 mL/min (ref >60.00)
GLUCOSE: 74 mg/dL (ref 70–99)
POTASSIUM: 4.2 meq/L (ref 3.5–5.1)
SODIUM: 141 meq/L (ref 135–145)
Total Bilirubin: 0.5 mg/dL (ref 0.2–1.2)
Total Protein: 6.5 g/dL (ref 6.0–8.3)

## 2014-02-20 LAB — CBC WITH DIFFERENTIAL/PLATELET
BASOS PCT: 0.2 % (ref 0.0–3.0)
Basophils Absolute: 0 10*3/uL (ref 0.0–0.1)
EOS PCT: 4 % (ref 0.0–5.0)
Eosinophils Absolute: 0.3 10*3/uL (ref 0.0–0.7)
HCT: 45.1 % (ref 39.0–52.0)
Hemoglobin: 15.1 g/dL (ref 13.0–17.0)
LYMPHS PCT: 26.7 % (ref 12.0–46.0)
Lymphs Abs: 1.7 10*3/uL (ref 0.7–4.0)
MCHC: 33.5 g/dL (ref 30.0–36.0)
MCV: 94.4 fl (ref 78.0–100.0)
MONO ABS: 0.5 10*3/uL (ref 0.1–1.0)
MONOS PCT: 8 % (ref 3.0–12.0)
NEUTROS PCT: 61.1 % (ref 43.0–77.0)
Neutro Abs: 3.9 10*3/uL (ref 1.4–7.7)
PLATELETS: 162 10*3/uL (ref 150.0–400.0)
RBC: 4.78 Mil/uL (ref 4.22–5.81)
RDW: 13.7 % (ref 11.5–15.5)
WBC: 6.3 10*3/uL (ref 4.0–10.5)

## 2014-02-20 LAB — BRAIN NATRIURETIC PEPTIDE: PRO B NATRI PEPTIDE: 28 pg/mL (ref 0.0–100.0)

## 2014-02-20 LAB — TSH: TSH: 1.71 u[IU]/mL (ref 0.35–4.50)

## 2014-02-20 NOTE — Assessment & Plan Note (Signed)
1 year's duration but worsening over last 3 months. Start with TSH to eval thyroid, CBC to look for anemia, BNP to evaluate for heart failure, CMET to assess kidney and albumin, urine to evaluate for protein, EKG without signs hypertrophy and nonspecific st-t wave changes. Will get echo to evaluate for CHF. Venous duplex to rule out DVT given history of malignancy but I highly doubt this given chronicity (but R leg is >L). We discussed no acute treatment given chronicity. Finally, we discussed given history melanoma that we may need to complete Ct abd/pelvis if above workup is unremarkable. F/u after investigations to reassess.

## 2014-02-20 NOTE — Patient Instructions (Signed)
Let's follow up a few days after you have your echo and venous duplex. I will call you if we need to discuss results before that time.   It was wonderful to meet you,  Dr. Yong Channel  Orders Placed This Encounter  Procedures  . TSH    [place]  . CBC with Differential  . Pro b natriuretic peptide  . Comprehensive metabolic panel    [place]  . POCT urinalysis dipstick  . EKG 12-Lead  . 2D Echocardiogram without contrast    Standing Status: Future     Number of Occurrences:      Standing Expiration Date: 02/21/2015    Scheduling Instructions:     Edema. Concern pulmonary hypertension or CHF.    Order Specific Question:  Type of Echo    Answer:  Complete    Order Specific Question:  Where should this test be performed    Answer:  [facility])    Order Specific Question:  Reason for exam-Echo    Answer:  Other - See Comments Section  . Lower Extremity Venous Duplex Bilateral    Standing Status: Future     Number of Occurrences:      Standing Expiration Date: 02/21/2015    Order Specific Question:  Laterality    Answer:  Bilateral    Order Specific Question:  Where should this test be performed:    Answer:  [facility]

## 2014-02-20 NOTE — Progress Notes (Signed)
Pre visit review using our clinic review tool, if applicable. No additional management support is needed unless otherwise documented below in the visit note. 

## 2014-02-20 NOTE — Progress Notes (Addendum)
Nicholas Reddish, MD Phone: [phone]  Subjective:   Nicholas [name] is a 68 y.o. year old very pleasant male patient who presents with the following:  Leg Swelling Patient has had swelling in his left leg since he had his 2nd and 3rd toe removed in relation to melanoma in 2009 as a result of having to have several lymph nodes removed behind his left knee and in his left groin. About a year ago, he noted some worsening of the edema in his left leg. Around that time, he also noted swelling in his right leg which he had never had before. Since the beginning of the summer, the edema has worsened in both legs but R much greater than left. Patient has custom compression stockings which he has been using. Denies calf pain but legs do feel tight at times. No treatments tried. States he mentioned the swelling to Dr. Leanne [name] in February but that it was not as significant and did not bother/concern him as much.  Patient does also complain of some fatigue but this has been unchanged since his survey. He occasionally does feel lightheaded though and not always with standing.  ROS- no orthopnea/PND. He has slept on 8 pillows for years with no recent change.  No chest pain. No shortness of breath. Gets winded occasionally but no recent changes and with exerting himself. He can walk around a store ok. Weight stable since January and June of 2 years ago.   Past Medical History- HTN controlled on metoprolol, HLD controlled on crestor, history of melanoma as noted above as well as excision of some left lower extremity lymph nodes  Medications- reviewed and updated Current Outpatient Prescriptions  Medication Sig Dispense Refill  . CRESTOR 10 MG tablet TAKE 1 TABLET ONCE DAILY.  30 tablet  11  . metoprolol (LOPRESSOR) 50 MG tablet TAKE 1 TABLET TWICE DAILY.  60 tablet  5   No current facility-administered medications for this visit.   Objective: BP 130/88  Pulse 67  Temp(Src) 98.4 F (36.9 C) (Oral)  Ht 5'  6.5" (1.689 m)  Wt 220 lb (99.791 kg)  BMI 34.98 kg/m2  SpO2 96% Gen: NAD, resting comfortably CV: RRR no murmurs rubs or gallops, no JVD, 1+ DP pulses Lungs: CTAB no crackles, wheeze, rhonchi Ext: 2nd and 3rd toe amputation on left foot. 1+ pitting edema on left foot. 2+ pitting edema in left leg. R Calf size greater than left calf. Skin: warm, dry, very mild erythema on right lower leg. Lack of hair on both legs in lower portions c/w venous stasis changes.  Neuro: grossly normal, moves all extremities, intact distal sensation.   EKG: sinus rhythm with rate of 70. Normal axis and intervals. No hypertrophy. Nonspecific st-t wave changes with flattening in III, inversion in v2 with no prior for comparison.   Assessment/Plan:  Edema 1 year's duration but worsening over last 3 months. Start with TSH to eval thyroid, CBC to look for anemia, BNP to evaluate for heart failure, CMET to assess kidney and albumin, urine to evaluate for protein, EKG without signs hypertrophy and nonspecific st-t wave changes. Will get echo to evaluate for CHF. Venous duplex to rule out DVT given history of malignancy but I highly doubt this given chronicity (but R leg is >L). We discussed no acute treatment given chronicity. Finally, we discussed given history melanoma that we may need to complete Ct abd/pelvis if above workup is unremarkable. F/u after investigations to reassess.    Orders Placed This  Encounter  Procedures  . TSH    [place]  . CBC with Differential  . Pro b natriuretic peptide  . Comprehensive metabolic panel    [place]  . Brain natriuretic peptide  . POCT urinalysis dipstick  . EKG 12-Lead  . 2D Echocardiogram without contrast  . Lower Extremity Venous Duplex Bilateral

## 2014-02-26 ENCOUNTER — Ambulatory Visit (HOSPITAL_BASED_OUTPATIENT_CLINIC_OR_DEPARTMENT_OTHER): Payer: Medicare HMO | Admitting: Cardiology

## 2014-02-26 ENCOUNTER — Ambulatory Visit (HOSPITAL_COMMUNITY): Payer: Medicare HMO | Attending: Family Medicine | Admitting: Radiology

## 2014-02-26 DIAGNOSIS — E785 Hyperlipidemia, unspecified: Secondary | ICD-10-CM | POA: Diagnosis not present

## 2014-02-26 DIAGNOSIS — Z87891 Personal history of nicotine dependence: Secondary | ICD-10-CM | POA: Diagnosis not present

## 2014-02-26 DIAGNOSIS — I379 Nonrheumatic pulmonary valve disorder, unspecified: Secondary | ICD-10-CM | POA: Diagnosis not present

## 2014-02-26 DIAGNOSIS — I079 Rheumatic tricuspid valve disease, unspecified: Secondary | ICD-10-CM | POA: Insufficient documentation

## 2014-02-26 DIAGNOSIS — I10 Essential (primary) hypertension: Secondary | ICD-10-CM | POA: Insufficient documentation

## 2014-02-26 DIAGNOSIS — E669 Obesity, unspecified: Secondary | ICD-10-CM | POA: Insufficient documentation

## 2014-02-26 DIAGNOSIS — R609 Edema, unspecified: Secondary | ICD-10-CM | POA: Insufficient documentation

## 2014-02-26 DIAGNOSIS — I519 Heart disease, unspecified: Secondary | ICD-10-CM | POA: Insufficient documentation

## 2014-02-26 NOTE — Progress Notes (Signed)
Bilateral lower extremity duplex performed

## 2014-02-26 NOTE — Progress Notes (Signed)
Echocardiogram performed.  

## 2014-03-03 ENCOUNTER — Encounter: Payer: Self-pay | Admitting: Family Medicine

## 2014-03-03 ENCOUNTER — Telehealth: Payer: Self-pay | Admitting: *Deleted

## 2014-03-03 ENCOUNTER — Ambulatory Visit (INDEPENDENT_AMBULATORY_CARE_PROVIDER_SITE_OTHER): Payer: Medicare HMO | Admitting: Family Medicine

## 2014-03-03 ENCOUNTER — Other Ambulatory Visit: Payer: Self-pay | Admitting: *Deleted

## 2014-03-03 VITALS — BP 140/96 | HR 68 | Temp 99.0°F | Wt 220.0 lb

## 2014-03-03 DIAGNOSIS — R609 Edema, unspecified: Secondary | ICD-10-CM

## 2014-03-03 DIAGNOSIS — I10 Essential (primary) hypertension: Secondary | ICD-10-CM

## 2014-03-03 NOTE — Assessment & Plan Note (Signed)
Poorly controlled today based on diastolic while systolic at goal less than 150. Due to the lightheadedness which may be related to metoprolol will have patient check blood pressure each morning for next week. If controlled at this time may be able to come off of metoprolol. Patient also admits to not eating breakfast which may contribute so he will eat breakfast over the next week.

## 2014-03-03 NOTE — Telephone Encounter (Signed)
Left VM asking when his next appointment with Dr. Benay Spice was? Per records he was last seen 03/15/12 and was scheduled for 1 year follow up and he was 'no show'.

## 2014-03-03 NOTE — Patient Instructions (Signed)
For 1 week, take toprol, eat breakfast, check blood pressure daily and record. Send me records in 1 week including times of day you are dizzy.   I will contact Dr. Benay Spice and discuss CT abdomen/pelvis with contrast to see if he agrees or has other thoughts or if he would like to see you first. i would advise you to go ahead and call him to schedule your next appointment.

## 2014-03-03 NOTE — Progress Notes (Signed)
  [name], MD Phone: [phone]  Subjective:   Nicholas [name] is a 68 y.o. year old very pleasant male patient who presents with the following:  Edema Edema stable since last visit. Patient continues to use compression stocking on left leg due to presumed history of lymphedema. He has a compression stocking on the right leg. I mistakenly thought that patient wore compression stockings on both legs at last visit. We completed a workup at last visit including labs, venous duplex, echocardiogram, EKG. Workup largely unremarkable except for grade 1 diastolic dysfunction.  ROS- no orthopnea/PND. He has slept on 8 pillows for years with no recent change.  No chest pain.  Gets winded occasionally but no recent changes and with exerting himself. He can walk around a store without difficulty. Weight stable since January and June of 2 years ago. Weight stable since last visit.  Hypertension BP Readings from Last 3 Encounters:  03/03/14 140/96  02/20/14 130/88  08/25/13 156/98  Home BP monitoring-no Compliant with medications-yes. He occasionally does feel lightheaded though and not always with standing. He states this is usually after his dose of metoprolol. ROS-Denies any CP, HA, SOB  Past Medical History- HTN controlled on metoprolol, HLD controlled on crestor, history of melanoma as noted above as well as excision of some left lower extremity lymph nodes  Medications- reviewed and updated Current Outpatient Prescriptions  Medication Sig Dispense Refill  . CRESTOR 10 MG tablet TAKE 1 TABLET ONCE DAILY.  30 tablet  11  . metoprolol (LOPRESSOR) 50 MG tablet TAKE 1 TABLET TWICE DAILY.  60 tablet  5   No current facility-administered medications for this visit.   Objective: BP 140/96  Pulse 68  Temp(Src) 99 F (37.2 C)  Wt 220 lb (99.791 kg) Gen: NAD, resting comfortably CV: RRR no murmurs rubs or gallops, no JVD, 1+ DP pulses Lungs: CTAB no crackles, wheeze, rhonchi Ext: 2nd and  3rd toe amputation on left foot. 1+ pitting edema on left foot. 2+ pitting edema in left leg. R Calf size greater than left calf without tenderness.  Skin: warm, dry, very mild erythema on right lower leg. Lack of hair on both legs in lower portions c/w venous stasis changes.   Assessment/Plan:  HYPERTENSION Poorly controlled today based on diastolic while systolic at goal less than 150. Due to the lightheadedness which may be related to metoprolol will have patient check blood pressure each morning for next week. If controlled at this time may be able to come off of metoprolol. Patient also admits to not eating breakfast which may contribute so he will eat breakfast over the next week.  Edema Previous workup largely unremarkable including TSH CBC BNP CMET EKG venous duplex. Echocardiogram did show grade 1 diastolic dysfunction. I am not sure this is adequate enough to account for patient's edema although edema could simply be idiopathic. Right leg is likely larger because the patient does not have compression stocking for the leg.I expressed my concern of potential malignancy in plan for CT abdomen / pelvis.  Patient asks that I first consult with Dr. Benay Spice his oncologist.I will send Dr. Benay Spice message through epic at this time. Discussed with patient Dr. Victorino Sparrow may want to see him as record seems to show patient has not seen oncologist in 2 years. Patient is going to schedule a followup at this time.

## 2014-03-03 NOTE — Assessment & Plan Note (Signed)
Previous workup largely unremarkable including TSH CBC BNP CMET EKG venous duplex. Echocardiogram did show grade 1 diastolic dysfunction. I am not sure this is adequate enough to account for patient's edema although edema could simply be idiopathic. Right leg is likely larger because the patient does not have compression stocking for the leg.I expressed my concern of potential malignancy in plan for CT abdomen / pelvis.  Patient asks that I first consult with Dr. Benay Spice his oncologist.I will send Dr. Benay Spice message through epic at this time. Discussed with patient Dr. Victorino Sparrow may want to see him as record seems to show patient has not seen oncologist in 2 years. Patient is going to schedule a followup at this time.

## 2014-03-03 NOTE — Telephone Encounter (Signed)
Made Nicholas [name] aware that he missed his 1 year follow up. Will put in order to reschedule. He is doing fine-not urgent. Says Dr. Yong Channel will be sending a note regarding a follow up scan.

## 2014-03-07 ENCOUNTER — Telehealth: Payer: Self-pay | Admitting: Oncology

## 2014-03-07 NOTE — Telephone Encounter (Signed)
S/w pt re appt for 10/5.

## 2014-03-09 ENCOUNTER — Telehealth: Payer: Self-pay | Admitting: Family Medicine

## 2014-03-09 MED ORDER — LISINOPRIL 20 MG PO TABS: 10.0000 mg | ORAL_TABLET | Freq: Every day | ORAL | Status: DC

## 2014-03-09 NOTE — Telephone Encounter (Signed)
Spoke with patient about oncologist's recommendations to not pursue CT scan abd/pelvis at this time due to likely chronic edema.  Patient will f/u with oncology in 2 months.   Patient let me know BP today 145/96 which is typical for him. He is worried metoprolol could be contributing to edema. Wants to stop medication. Take 1/2 tab twice daily for 2 days then stop. Start Lisinopril tomorrow at 10 mg (1/2 20mg  pill). See me in 2 weeks for BP and Cr check.

## 2014-03-16 ENCOUNTER — Encounter: Payer: Self-pay | Admitting: Family Medicine

## 2014-03-18 ENCOUNTER — Telehealth: Payer: Self-pay | Admitting: Family Medicine

## 2014-03-18 NOTE — Telephone Encounter (Signed)
Received blood pressure log with several elevated blood pressures. Let's have patient in at his earliest convenience. Will ask Clyde Lundborg, CMA to hold onto blood pressure log until patient visit.

## 2014-03-18 NOTE — Telephone Encounter (Signed)
Ms. Malachy Mood can you see if you can work pt in sooner? If not the date that he already has will be fine.

## 2014-03-19 ENCOUNTER — Encounter: Payer: Self-pay | Admitting: Family Medicine

## 2014-03-19 ENCOUNTER — Ambulatory Visit (INDEPENDENT_AMBULATORY_CARE_PROVIDER_SITE_OTHER): Payer: Medicare HMO | Admitting: Family Medicine

## 2014-03-19 VITALS — BP 144/86 | HR 96 | Temp 99.2°F | Wt 220.0 lb

## 2014-03-19 DIAGNOSIS — I10 Essential (primary) hypertension: Secondary | ICD-10-CM

## 2014-03-19 LAB — BASIC METABOLIC PANEL
BUN: 18 mg/dL (ref 6–23)
CALCIUM: 9.1 mg/dL (ref 8.4–10.5)
CO2: 31 meq/L (ref 19–32)
Chloride: 104 mEq/L (ref 96–112)
Creatinine, Ser: 1 mg/dL (ref 0.4–1.5)
GFR: 82.8 mL/min (ref >60.00)
Glucose, Bld: 96 mg/dL (ref 70–99)
Potassium: 4.7 mEq/L (ref 3.5–5.1)
Sodium: 140 mEq/L (ref 135–145)

## 2014-03-19 NOTE — Patient Instructions (Signed)
Blood pressure  Better on repeat at 144/84  Take 1/2 pill tonight  Increase lisinopril to full pill tomorrow night  If you are still dizzy with 1/2 pill, continue on 1/2 until Monday  Check BP in the mornings and send me results on Monday (call [name])  Alternative medicine is hydrochlorothiazide  Start taking aspirin 81mg  daily  See me back next week to recheck in office and check your cuff from home Dr. Yong Channel

## 2014-03-19 NOTE — Assessment & Plan Note (Signed)
Well controlled in office but poorly controlled on home readings. Patient also with worse dizziness on lisinopril. Trial shifting taking medicine to before bed. Continue 10mg  for now and if tolerating 10mg  at night, increase to 20mg . Follow up in 1 week. BMET today to make sure Cr not increased. Could consider HCTZ or extended release metoprolol.

## 2014-03-19 NOTE — Progress Notes (Signed)
  [name], MD Phone: [phone]  Subjective:   Nicholas [name] is a 68 y.o. year old very pleasant male patient who presents with the following:  Hypertension BP Readings from Last 3 Encounters:  03/19/14 144/86  03/03/14 140/96  02/20/14 130/88  Home BP monitoring-yes Changed to lisinopril 10mg  from metoprolol BID on 8/17. Since that time, either diastolic or systolic has been elevated with range of 145-161/87-107. Patient also very dizzy (worse than previous med) right after he takes medicine. Wife takes at night and does not have any issues Compliant with medications-yes but with dizziness as above ROS-Denies any CP, HA, SOB, blurry vision, worsening LE edema.   Past Medical History-HTN, HLD controlled on crestor, history of melanoma as noted above as well as excision of some left lower extremity lymph nodes  Medications- reviewed and updated Current Outpatient Prescriptions  Medication Sig Dispense Refill  . CRESTOR 10 MG tablet TAKE 1 TABLET ONCE DAILY.  30 tablet  11  . lisinopril (PRINIVIL,ZESTRIL) 20 MG tablet Take 0.5 tablets (10 mg total) by mouth daily.  30 tablet  2   No current facility-administered medications for this visit.    Objective: BP 144/86  Pulse 96  Temp(Src) 99.2 F (37.3 C)  Wt 220 lb (99.791 kg) Gen: NAD, resting comfortably CV: RRR no murmurs rubs or gallops Lungs: CTAB no crackles, wheeze, rhonchi Ext:  1+ pitting edema on left foot. 2+ pitting edema in left leg. R Calf size greater than left calf without tenderness.   Assessment/Plan:  HYPERTENSION Well controlled in office but poorly controlled on home readings. Patient also with worse dizziness on lisinopril. Trial shifting taking medicine to before bed. Continue 10mg  for now and if tolerating 10mg  at night, increase to 20mg . Follow up in 1 week. BMET today to make sure Cr not increased. Could consider HCTZ or extended release metoprolol.    Orders Placed This Encounter  Procedures   . Basic metabolic panel    [place]

## 2014-04-03 ENCOUNTER — Encounter: Payer: Self-pay | Admitting: Internal Medicine

## 2014-04-16 ENCOUNTER — Other Ambulatory Visit: Payer: Self-pay

## 2014-04-16 MED ORDER — LISINOPRIL 20 MG PO TABS: ORAL_TABLET | ORAL | Status: DC

## 2014-04-27 ENCOUNTER — Ambulatory Visit (HOSPITAL_BASED_OUTPATIENT_CLINIC_OR_DEPARTMENT_OTHER): Payer: Medicare HMO | Admitting: Oncology

## 2014-04-27 ENCOUNTER — Telehealth: Payer: Self-pay | Admitting: Oncology

## 2014-04-27 VITALS — BP 136/82 | HR 101 | Temp 98.3°F | Resp 19 | Ht 66.5 in | Wt 218.9 lb

## 2014-04-27 DIAGNOSIS — R609 Edema, unspecified: Secondary | ICD-10-CM

## 2014-04-27 DIAGNOSIS — Z85820 Personal history of malignant melanoma of skin: Secondary | ICD-10-CM

## 2014-04-27 DIAGNOSIS — C439 Malignant melanoma of skin, unspecified: Secondary | ICD-10-CM

## 2014-04-27 NOTE — Progress Notes (Signed)
  [facility] OFFICE PROGRESS NOTE   Diagnosis: Melanoma  INTERVAL HISTORY:   Nicholas [name] was last seen at the [facility] 2 years ago. He is now being followed by Dr. Yong Channel for internal medicine care. He continues to see Dr. Ronnald Ramp for yearly skin exam. Nicholas [name] feels well. No pain. No suspicious skin lesions. The left leg edema is controlled with a compression stocking that he wears during the day. He continues to have edema at the right lower leg. This has been evaluated by Dr. Yong Channel.  Objective:  Vital signs in last 24 hours:  Blood pressure 136/82, pulse 101, temperature 98.3 F (36.8 C), temperature source Oral, resp. rate 19, height 5' 6.5" (1.689 m), weight 218 lb 14.4 oz (99.292 kg).    HEENT: Neck without mass Lymphatics: No cervical, supraclavicular, axillary, inguinal, or femoral nodes Resp: Lungs clear bilaterally Cardio: Regular rate and rhythm GI: No hepatosplenomegaly, nontender, no mass Vascular: 2+ pitting edema below the right knee, trace to 1+ pitting edema below the left knee.  Skin: No evidence for recurrent tumor at the left foot amputation sites. Left thigh scar without evidence of recurrent tumor. Multiple benign appearing moles over the trunk and extremities   Medications: I have reviewed the patient's current medications.  Assessment/Plan: 1. 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 2. 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 3. Left leg edema, stable. He uses a compression stocking intermittently. 4. History of dizziness, anorexia, and weight loss secondary to interferon, resolved. 5. A 1.2 cm precarinal lymph node was seen on a PET scan in December 2007 without associated  increased FDG activity. A PET scan 09/16/2007 revealed no evidence for recurrent melanoma. 6. 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 7. Right low leg/ankle edema: Present for at least 3 years, etiology unclear    Disposition:  Nicholas [name] remains in clinical remission from melanoma. He will continue yearly dermatology followup with Dr. Ronnald Ramp. He developed left leg lymphedema following the sentinel lymph node biopsy procedure. This edema is improved with a compression stocking. The etiology of the right leg edema is unclear. I have a low clinical suspicion for recurrent melanoma causing an obstruction. He has pitting edema in the right leg. He plans to continue followup with Dr. Yong Channel to evaluate and manage the edema.  I am available to see him and discuss the case with Dr. Yong Channel as needed.  Nicholas [name] will return for an office visit in one year.  Betsy Coder, MD  04/27/2014  4:22 PM

## 2014-04-27 NOTE — Progress Notes (Signed)
Patient to have flu shot with PCP on 04/29/14.

## 2014-04-27 NOTE — Telephone Encounter (Signed)
Pt confirmed MD visit per 10/05 POF......Marland Kitchen KJ

## 2014-04-29 ENCOUNTER — Ambulatory Visit (INDEPENDENT_AMBULATORY_CARE_PROVIDER_SITE_OTHER): Payer: Medicare HMO | Admitting: Family Medicine

## 2014-04-29 ENCOUNTER — Encounter: Payer: Self-pay | Admitting: Family Medicine

## 2014-04-29 VITALS — BP 120/82 | Temp 98.4°F | Wt 220.0 lb

## 2014-04-29 DIAGNOSIS — Z87891 Personal history of nicotine dependence: Secondary | ICD-10-CM | POA: Insufficient documentation

## 2014-04-29 DIAGNOSIS — Z23 Encounter for immunization: Secondary | ICD-10-CM

## 2014-04-29 DIAGNOSIS — I10 Essential (primary) hypertension: Secondary | ICD-10-CM

## 2014-04-29 DIAGNOSIS — E785 Hyperlipidemia, unspecified: Secondary | ICD-10-CM

## 2014-04-29 DIAGNOSIS — R609 Edema, unspecified: Secondary | ICD-10-CM

## 2014-04-29 NOTE — Progress Notes (Signed)
Nicholas Reddish, MD Phone: [phone]  Subjective:  Patient presents today to establish care with me as their new primary care provider. Patient was formerly a patient of Dr. Leanne Chang. Chief complaint-noted.   Hypertension-well-controlled BP Readings from Last 3 Encounters:  04/29/14 120/82  04/27/14 136/82  03/19/14 144/86   patient continues to complain of dizziness even with nighttime dosing of lisinopril. It is definitely improved though. He would like to try 10 mg at night ROS-Denies any CP, HA, SOB, blurry vision.   Hyperlipidemia-controlled Lab Results  Component Value Date   LDLCALC 90 08/25/2013  On statin: Crestor 10 mg. Interested in generic option but does not want to switch. Informed no generic for Crestor at this time Regular exercise: No, advised ROS- no chest pain or shortness of breath. No myalgias  Edema, predominantly right-sided with known left-sided lymphedema-stable -Edema unchanged in right leg. Saw Dr. Benay Spice this month does not think this is melanoma related ROS-no orthopnea, PND, chest pain, dyspnea on exertion  The following were reviewed and entered/updated in epic: Past Medical History  Diagnosis Date  . Hyperlipidemia   . Hypertension   . Cancer Jan 2008    skin; hx of melanoma left foot/ amputation of toes 1&2    Patient Active Problem List   Diagnosis Date Noted  . MALIGNANT MELANOMA SKIN LOWER LIMB INCLUDING HIP 03/09/2009    Priority: High  . Edema 02/20/2014    Priority: Medium  . Hyperglycemia 08/02/2012    Priority: Medium  . Hyperlipidemia 12/13/2006    Priority: Medium  . Essential hypertension 12/13/2006    Priority: Medium  . Former smoker 04/29/2014    Priority: Low  . Obesity 07/13/2010    Priority: Low  . Insomnia 10/03/2007    Priority: Low   Past Surgical History  Procedure Laterality Date  . Removal of melanoma of left foot/amputation of toes 1&25 Jul 2006  . 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 Wrist fracture  surgery      68 years old-set    Family History  Problem Relation Age of Onset  . Hypertension Father   . CVA Father     age 49    Medications- reviewed and updated Current Outpatient Prescriptions  Medication Sig Dispense Refill  . aspirin EC 81 MG tablet Take 81 mg by mouth at bedtime.      . CRESTOR 10 MG tablet TAKE 1 TABLET ONCE DAILY.  30 tablet  11  . lisinopril (PRINIVIL,ZESTRIL) 20 MG tablet Take 1 tablet daily  60 tablet  2   No current facility-administered medications for this visit.    Allergies-reviewed and updated No Known Allergies  History   Social History  . Marital Status: Married    Spouse Name: N/A    Number of Children: N/A  . Years of Education: N/A   Social History Main Topics  . Smoking status: Former Smoker -- 1.00 packs/day for 16 years    Types: Cigarettes    Quit date: 12/22/1997  . Smokeless tobacco: None  . Alcohol Use: 10.5 oz/week    21 drink(s) per week  . Drug Use: No  . Sexual Activity: None   Other Topics Concern  . None   Social History Narrative   Married 1981. Wife has kids-1 with 1 adopted grandchild.       Retired Tax adviser      Hobbies: antiques, former Air cabin crew      Exercise: none currently.     ROS--See HPI  Objective: BP 120/82  Temp(Src) 98.4 F (36.9 C)  Wt 220 lb (99.791 kg) Gen: NAD, resting comfortably CV: RRR no murmurs rubs or gallops Lungs: CTAB no crackles, wheeze, rhonchi Ext:  1+ pitting edema on left foot. 2+ pitting edema in left leg. R Calf size greater than left calf without tenderness.   Assessment/Plan:  Edema Right leg continues to be edematous more so than the left with known lymphedema. We'll try compression stockings on the right side, it is possible the discrepancy is due to patient wearing compression stocking on the left. No systemic signs suggest abdominal melanoma metastasis. Dr. Benay Spice thought CT abdomen pelvis would be low yield. Continue to monitor for now  Essential  hypertension Well-controlled on lisinopril 20 mg nightly. Does have lightheadedness with this but decreased with nightly use. Decreased to 10 mg. Send blood pressure log in one week. Followup in 6 months or as needed  Hyperlipidemia Well-controlled on Crestor 10 mg. Continue current medication. Discussed other more cost effective solutions the patient would like to remain on Crestor until generic  Former smoker Recommended onetime AAA screening the patient not interested  Return precautions advised.

## 2014-04-29 NOTE — Assessment & Plan Note (Signed)
Well-controlled on lisinopril 20 mg nightly. Does have lightheadedness with this but decreased with nightly use. Decreased to 10 mg. Send blood pressure log in one week. Followup in 6 months or as needed

## 2014-04-29 NOTE — Assessment & Plan Note (Signed)
Right leg continues to be edematous more so than the left with known lymphedema. We'll try compression stockings on the right side, it is possible the discrepancy is due to patient wearing compression stocking on the left. No systemic signs suggest abdominal melanoma metastasis. Dr. Benay Spice thought CT abdomen pelvis would be low yield. Continue to monitor for now

## 2014-04-29 NOTE — Assessment & Plan Note (Signed)
Recommended onetime AAA screening the patient not interested

## 2014-04-29 NOTE — Assessment & Plan Note (Signed)
Well-controlled on Crestor 10 mg. Continue current medication. Discussed other more cost effective solutions the patient would like to remain on Crestor until generic

## 2014-04-29 NOTE — Patient Instructions (Addendum)
Try 10mg  of lisinopril at night instead of 20mg . Send me a message in a week what your blood pressure is running.   Start out walking 3-5 minutes per day as long as no shortness of breath or chest pain. Eventual goal would be 30 minutes per day. May be able to further reduce blood pressure medicine if we get you walking and losing a few lbs.   Flu shot today  Try the compression stocking on the right leg as long as no pain with this.   See me in 6 months

## 2014-06-29 ENCOUNTER — Ambulatory Visit (INDEPENDENT_AMBULATORY_CARE_PROVIDER_SITE_OTHER): Payer: Medicare HMO | Admitting: Family Medicine

## 2014-06-29 ENCOUNTER — Encounter: Payer: Self-pay | Admitting: Family Medicine

## 2014-06-29 VITALS — BP 120/84 | Temp 98.7°F | Wt 220.0 lb

## 2014-06-29 DIAGNOSIS — I10 Essential (primary) hypertension: Secondary | ICD-10-CM

## 2014-06-29 DIAGNOSIS — R609 Edema, unspecified: Secondary | ICD-10-CM

## 2014-06-29 NOTE — Assessment & Plan Note (Signed)
Stop lisinopril. Consider lasix or hctz if BP control needed. Possible BP better controlled since patient started walking recently at 1 mile per day 7 days a week.

## 2014-06-29 NOTE — Progress Notes (Signed)
  [name], MD Phone: [phone]  Subjective:   Nicholas [name] is a 68 y.o. year old very pleasant male patient who presents with the following:   Edema- mild worsening Continues to be an issue. Using custom fitted compression stocking for left leg on right became uncomfortable and he rarely wears. Swelling has been worse since that time in the right leg, largely stable in left which he is wearing compression stocking. . Full workup before unrevealing and abdominal/pelvic CT not recommended by oncology with history melanoma. As day progresses and if legs swelling-tends to get numbness in tingling from mid shin down over last 2-3 months.  ROS- no calf pain or tenderness, no pain in lower extremity, does have occasional low back pain and has had sciatica type symptoms on left buttocks/leg in past.   Hypertension-good control Patient with mild dizziness throughout the day despite only taking 10mg  lisinopril.   BP Readings from Last 3 Encounters:  06/29/14 120/84  04/29/14 120/82  04/27/14 136/82  Home BP monitoring-no Started walking 1 mile per day 7 days a week Compliant with medications-yes without side effects ROS-Denies any CP, HA, SOB, blurry vision.   Past Medical History- Patient Active Problem List   Diagnosis Date Noted  . MALIGNANT MELANOMA SKIN LOWER LIMB INCLUDING HIP 03/09/2009    Priority: High  . Edema 02/20/2014    Priority: Medium  . Hyperglycemia 08/02/2012    Priority: Medium  . Hyperlipidemia 12/13/2006    Priority: Medium  . Essential hypertension 12/13/2006    Priority: Medium  . Former smoker 04/29/2014    Priority: Low  . Obesity 07/13/2010    Priority: Low  . Insomnia 10/03/2007    Priority: Low   Medications- reviewed and updated Current Outpatient Prescriptions  Medication Sig Dispense Refill  . aspirin EC 81 MG tablet Take 81 mg by mouth at bedtime.    . CRESTOR 10 MG tablet TAKE 1 TABLET ONCE DAILY. 30 tablet 11   No current  facility-administered medications for this visit.    Objective: BP 120/84 mmHg  Temp(Src) 98.7 F (37.1 C)  Wt 220 lb (99.791 kg) Gen: NAD, resting comfortably in chair CV: RRR no murmurs rubs or gallops Lungs: CTAB no crackles, wheeze, rhonchi Ext:  1+ pitting edema on left foot. 2+ pitting edema in left leg. R Calf size greater than left calf without tenderness. (Unchanged from previous) Skin: venous stasis changes noted on right leg with mild erythema  Assessment/Plan:  Edema Patient needs compression stockings for both legs. He is going to follow-up with [facility] orthopedics where he was fit for compression stockings in the past. Consider Lasix but with his lightheadedness want to avoid this time. If blood pressure goes up off the lisinopril could consider hydrochlorothiazide or Lasix which may help some with his edema. I thinks symptoms are largely stable but if they do worsen I would consider abdominal/pelvic CT once again in discussion with his oncologist.  Essential hypertension Stop lisinopril. Consider lasix or hctz if BP control needed. Possible BP better controlled since patient started walking recently at 1 mile per day 7 days a week.    Return precautions advised. Phone follow up in 1 week with BPs and reported symptoms.

## 2014-06-29 NOTE — Patient Instructions (Signed)
Please go to [facility] to get fitted for compression stockings for both legs.   Stop lisinopril. Update me in 1 week to let me know #1 what your blood pressure is doing (need to see me if >140/90) and if your lightheadedness is better

## 2014-06-29 NOTE — Assessment & Plan Note (Signed)
Patient needs compression stockings for both legs. He is going to follow-up with [facility] orthopedics where he was fit for compression stockings in the past. Consider Lasix but with his lightheadedness want to avoid this time. If blood pressure goes up off the lisinopril could consider hydrochlorothiazide or Lasix which may help some with his edema. I thinks symptoms are largely stable but if they do worsen I would consider abdominal/pelvic CT once again in discussion with his oncologist.

## 2014-10-06 ENCOUNTER — Other Ambulatory Visit: Payer: Self-pay | Admitting: Internal Medicine

## 2014-10-22 ENCOUNTER — Ambulatory Visit (INDEPENDENT_AMBULATORY_CARE_PROVIDER_SITE_OTHER): Payer: PPO | Admitting: Family Medicine

## 2014-10-22 ENCOUNTER — Encounter: Payer: Self-pay | Admitting: Family Medicine

## 2014-10-22 VITALS — BP 130/98 | HR 104 | Temp 98.6°F | Wt 219.0 lb

## 2014-10-22 DIAGNOSIS — R609 Edema, unspecified: Secondary | ICD-10-CM | POA: Diagnosis not present

## 2014-10-22 DIAGNOSIS — I5032 Chronic diastolic (congestive) heart failure: Secondary | ICD-10-CM | POA: Diagnosis not present

## 2014-10-22 DIAGNOSIS — I5189 Other ill-defined heart diseases: Secondary | ICD-10-CM | POA: Insufficient documentation

## 2014-10-22 DIAGNOSIS — R42 Dizziness and giddiness: Secondary | ICD-10-CM | POA: Diagnosis not present

## 2014-10-22 MED ORDER — FUROSEMIDE 20 MG PO TABS: 20.0000 mg | ORAL_TABLET | Freq: Every day | ORAL | Status: DC

## 2014-10-22 NOTE — Progress Notes (Signed)
Pre visit review using our clinic review tool, if applicable. No additional management support is needed unless otherwise documented below in the visit note. 

## 2014-10-22 NOTE — Patient Instructions (Signed)
Edema  Suspect venous insufficiency   We will refer to vascular surgery group  Lasix 20mg   Vascular can fit you with compression stockings or [facility] but you do need to start these.   Let's check in a few weeks after your vascular visit

## 2014-10-22 NOTE — Assessment & Plan Note (Signed)
Treat with lasix as potential cause of edema.

## 2014-10-22 NOTE — Progress Notes (Signed)
[name], MD Phone: [phone]  Subjective:   Nicholas [name] is a 69 y.o. year old very pleasant male patient who presents with the following:  Edema-worsening Dizziness- stable Diastolic CHF- worsening possibly if cause Patient has had worsened edema over last year. Please see last several office notes for further details. He has not yet gotten compression stockings for R leg as planned but feels like it is worsening and he would like to have a more concrete answer as to why and also would like better control.   He continues to feel lightheaded despite being taking completely off lisinopril. He feels more dizzy when his swelling is worse. He also feels more dizzy when he is in the shower or after eating food. Better in the morning and after a pedicure. Always feels at least a low level dizziness though. Not particularly worse with standing. R leg feels like he has to voluntarily lift it (actively engage mind).   ROS-Also complains of polyuria  About every hour and nocturia 2-3x a night for 6-7 months. PSA not increasing on last check.  handwritting getting smaller although no tremor, rigidity, shuffling gait. Long term tinnitus with prior eval by ENT. No vertigo.   Past Medical History- Patient Active Problem List   Diagnosis Date Noted  . MALIGNANT MELANOMA SKIN LOWER LIMB INCLUDING HIP 03/09/2009    Priority: High  . Edema 02/20/2014    Priority: Medium  . Hyperglycemia 08/02/2012    Priority: Medium  . Hyperlipidemia 12/13/2006    Priority: Medium  . Essential hypertension 12/13/2006    Priority: Medium  . Former smoker 04/29/2014    Priority: Low  . Obesity 07/13/2010    Priority: Low  . Insomnia 10/03/2007    Priority: Low   Medications- reviewed and updated Current Outpatient Prescriptions  Medication Sig Dispense Refill  . aspirin EC 81 MG tablet Take 81 mg by mouth at bedtime.    . CRESTOR 10 MG tablet TAKE 1 TABLET ONCE DAILY. 30 tablet 11  . furosemide  (LASIX) 20 MG tablet Take 1 tablet (20 mg total) by mouth daily. 30 tablet 3   No current facility-administered medications for this visit.    Objective: BP 130/98 mmHg  Pulse 104  Temp(Src) 98.6 F (37 C) (Oral)  Wt 219 lb (99.338 kg) Gen: NAD, resting comfortably in chair, stands slowly  CV: RRR no murmurs rubs or gallops Lungs: CTAB no crackles, wheeze, rhonchi Abd: soft/nontender/nondistended Ext:  1+ pitting edema on left foot. 1+ pitting edema in left leg. 2-3+ pitting edema in R calf and foot.  Skin: venous stasis changes noted on right leg with mild erythema      Assessment/Plan:  Edema Extensive prior workup-see previous notes. No anemia, thyroid normal, echo with diastolic dyfunction only but BNP not elevated, EKG largely normal, normal electrolytes and albumin, no protein in urine, venous duplex negative for DVT. Discussed CT abd/pelvis given history melanoma but oncology thought it would be low yield. Now dizziness tends to be tied to worsening edema (venous pooling as cause?). Regarding polyuria-a1c without diabetes and PSA not elevated at 0.58 on recent check, ? BPH. Regarding dizziness, tentative to add lasix but if symptoms truly worse with more swelling, this may help-may also help with BP. Now off all BP meds and not having primarily orthostatic symptoms.  Plan -start lasix 20mg  for symptomatic relief -refer to vascular surgery for further evaluation per patient request and may fit him with compression stockings on R  -ask  for their comment on CT abd/pelvis -he will go to [facility] if they do not set him up with compression stockings -follow up with me after vascular appointment   Diastolic CHF Treat with lasix as potential cause of edema.   Return precautions advised.   Orders Placed This Encounter  Procedures  . Ambulatory referral to Vascular Surgery    Referral Priority:  Routine    Referral Type:  Surgical    Referral Reason:  Specialty Services  Required    Referred to Provider:  Serafina Mitchell, MD    Requested Specialty:  Vascular Surgery    Number of Visits Requested:  1    Meds ordered this encounter  Medications  . furosemide (LASIX) 20 MG tablet    Sig: Take 1 tablet (20 mg total) by mouth daily.    Dispense:  30 tablet    Refill:  3

## 2014-10-22 NOTE — Assessment & Plan Note (Signed)
Extensive prior workup-see previous notes. No anemia, thyroid normal, echo with diastolic dyfunction only but BNP not elevated, EKG largely normal, normal electrolytes and albumin, no protein in urine, venous duplex negative for DVT. Discussed CT abd/pelvis given history melanoma but oncology thought it would be low yield. Now dizziness tends to be tied to worsening edema (venous pooling as cause?). Regarding polyuria-a1c without diabetes and PSA not elevated at 0.58 on recent check, ? BPH. Regarding dizziness, tentative to add lasix but if symptoms truly worse with more swelling, this may help-may also help with BP. Now off all BP meds and not having primarily orthostatic symptoms.  Plan -start lasix 20mg  for symptomatic relief -refer to vascular surgery for further evaluation per patient request and may fit him with compression stockings on R  -ask for their comment on CT abd/pelvis -he will go to [facility] if they do not set him up with compression stockings -follow up with me after vascular appointment

## 2014-10-23 ENCOUNTER — Other Ambulatory Visit: Payer: Self-pay

## 2014-10-23 DIAGNOSIS — R609 Edema, unspecified: Secondary | ICD-10-CM

## 2014-11-19 ENCOUNTER — Encounter: Payer: Self-pay | Admitting: Vascular Surgery

## 2014-11-20 ENCOUNTER — Encounter: Payer: Self-pay | Admitting: Vascular Surgery

## 2014-11-20 ENCOUNTER — Ambulatory Visit (HOSPITAL_COMMUNITY)
Admission: RE | Admit: 2014-11-20 | Discharge: 2014-11-20 | Disposition: A | Discharge status: Home / Self Care | Payer: PPO | Source: Ambulatory Visit | Attending: Vascular Surgery | Admitting: Vascular Surgery

## 2014-11-20 ENCOUNTER — Ambulatory Visit (INDEPENDENT_AMBULATORY_CARE_PROVIDER_SITE_OTHER): Payer: PPO | Admitting: Vascular Surgery

## 2014-11-20 VITALS — BP 154/95 | HR 79 | Ht 66.5 in | Wt 219.0 lb

## 2014-11-20 DIAGNOSIS — R609 Edema, unspecified: Secondary | ICD-10-CM

## 2014-11-20 DIAGNOSIS — I872 Venous insufficiency (chronic) (peripheral): Secondary | ICD-10-CM | POA: Diagnosis not present

## 2014-11-20 NOTE — Progress Notes (Signed)
Referred by:  [name], MD [place], [place] [id number]  Reason for referral: Swollen B leg  History of Present Illness  Nicholas [name] is a 69 y.o. (08-01-1945) male who presents with chief complaint: swollen leg.  Patient notes, onset of swelling years ago, associated with on L with melanoma resection and chemo/XRT.  The patient's symptoms include: edema and bursting sensation.  The patient has had never history of DVT, no history of varicose vein, no history of venous stasis ulcers, possible history of  LLE Lymphedema and no history of skin changes in lower legs.  There is no family history of venous disorders.  The patient has been using knee high compression stockings on left leg in the past.  Past Medical History  Diagnosis Date  . Hyperlipidemia   . Hypertension   . 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 Removal of melanoma of left foot/amputation of toes 1&25 Jul 2006  . 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 Wrist fracture surgery      69 years old-set    History   Social History  . Marital Status: Married    Spouse Name: N/A  . Number of Children: N/A  . Years of Education: N/A   Occupational History  . Not on file.   Social History Main Topics  . Smoking status: Former Smoker -- 1.00 packs/day for 16 years    Types: Cigarettes    Quit date: 12/22/1997  . Smokeless tobacco: Not on file  . Alcohol Use: 12.6 oz/week    21 Standard drinks or equivalent per week  . Drug Use: No  . Sexual Activity: Not on file   Other Topics Concern  . Not on file   Social History Narrative   Married 1981. Wife has kids-1 with 1 adopted grandchild.       Retired Tax adviser      Hobbies: antiques, former Air cabin crew      Exercise: none currently.     Family History  Problem Relation Age of Onset  . Hypertension Father   . CVA Father     age 50  .  Hyperlipidemia Father     Current Outpatient Prescriptions on File Prior to Visit  Medication Sig Dispense Refill  . aspirin EC 81 MG tablet Take 81 mg by mouth at bedtime.    . CRESTOR 10 MG tablet TAKE 1 TABLET ONCE DAILY. 30 tablet 11  . furosemide (LASIX) 20 MG tablet Take 1 tablet (20 mg total) by mouth daily. (Patient not taking: Reported on 11/20/2014) 30 tablet 3   No current facility-administered medications on file prior to visit.    No Known Allergies  REVIEW OF SYSTEMS:  (Positives checked otherwise negative)  CARDIOVASCULAR:  []  chest pain, []  chest pressure, []  palpitations, []  shortness of breath when laying flat, []  shortness of breath with exertion,  []  pain in feet when walking, []  pain in feet when laying flat, []  history of blood clot in veins (DVT), []  history of phlebitis, [x]  swelling in legs, []  varicose veins  PULMONARY:  []  productive cough, []  asthma, []  wheezing  NEUROLOGIC:  []  weakness in arms or legs, []  numbness in arms or legs, []  difficulty speaking or slurred speech, []  temporary loss of vision in one eye, [x]  dizziness, [x]  tinnitus  HEMATOLOGIC:  []   bleeding problems, []  problems with blood clotting too easily  MUSCULOSKEL:  []  joint pain, []  joint swelling  GASTROINTEST:  []  vomiting blood, []  blood in stool     GENITOURINARY:  []  burning with urination, []  blood in urine  PSYCHIATRIC:  []  history of major depression  INTEGUMENTARY:  []  rashes, []  ulcers  CONSTITUTIONAL:  []  fever, []  chills   Physical Examination Filed Vitals:   11/20/14 0913  BP: 154/95  Pulse: 79  Height: 5' 6.5" (1.689 m)  Weight: 219 lb (99.338 kg)  SpO2: 97%   Body mass index is 34.82 kg/(m^2).  General: A&O x 3, WDWN  Head: [place]/AT  Ear/Nose/Throat: Hearing grossly intact, nares w/o erythema or drainage, oropharynx w/o Erythema/Exudate  Eyes: PERRLA, EOMI  Neck: Supple, no nuchal rigidity, no palpable LAD  Pulmonary: Sym exp, good air movt, CTAB, no  rales, rhonchi, & wheezing  Cardiac: RRR, Nl S1, S2, no Murmurs, rubs or gallops  Vascular: Vessel Right Left  Radial Palpable Palpable  Brachial Palpable Palpable  Carotid Palpable, without bruit Palpable, without bruit  Aorta Not palpable due to pannus N/A  Femoral Palpable Palpable  Popliteal Not palpable Not palpable  PT Faintly Palpable Faintly Palpable  DP Faintly Palpable Faintly Palpable   Gastrointestinal: soft, NTND, -G/R, - HSM, - masses, - CVAT B  Musculoskeletal: M/S 5/5 throughout , Extremities without ischemic changes , mild LDS R LE, RLE 2+, LLE 1+  Neurologic: CN 2-12 intact , Pain and light touch intact in extremities , Motor exam as listed above  Psychiatric: Judgment intact, Mood & affect appropriate for pt's clinical situation  Dermatologic: See M/S exam for extremity exam, no rashes otherwise noted  Lymph : No Cervical, Axillary, or Inguinal lymphadenopathy    Non-Invasive Vascular Imaging  BLE Venous Insufficiency Duplex (Date: 11/20/2014):   RLE: no DVT and SVT, no GSV reflux, + deep venous reflux: CFV, FV, PV  LLE: no DVT and SVT, no GSV reflux, + deep venous reflux: CFV, FV, PV   Outside Studies/Documentation 3 pages of outside documents were reviewed including: outpatient PCP chart.  Medical Decision Making  Nicholas [name] is a 69 y.o. male who presents with: BLE chronic venous insufficiency (C4), likely some lymphedema LLE s/p melanoma resection and L groin XRT   Based on the patient's history and examination, I recommend: compressive therapy.  I discussed with the patient the use of her 20-30 mm thigh high compression stockings.  With only deep system reflux, there is no intervention except for compression.  EVLA in this patient is pointless as the GSV are competent on both side..  Thank you for allowing [place] to participate in this patient's care.  Adele Barthel, MD [facility] Office: [phone] Pager:  [phone]  11/20/2014, 9:20 AM

## 2014-12-14 ENCOUNTER — Encounter (HOSPITAL_COMMUNITY): Payer: PPO

## 2014-12-14 ENCOUNTER — Encounter: Payer: PPO | Admitting: Surgery

## 2014-12-25 ENCOUNTER — Encounter: Payer: Self-pay | Admitting: Family Medicine

## 2014-12-25 NOTE — Telephone Encounter (Signed)
Bevelyn Ngo- can you call patient or have one of our scheduler's call him to help set up an appointment?

## 2014-12-30 ENCOUNTER — Ambulatory Visit (INDEPENDENT_AMBULATORY_CARE_PROVIDER_SITE_OTHER): Payer: PPO | Admitting: Family Medicine

## 2014-12-30 ENCOUNTER — Encounter: Payer: Self-pay | Admitting: Family Medicine

## 2014-12-30 VITALS — BP 136/78 | HR 96 | Temp 98.7°F | Wt 223.0 lb

## 2014-12-30 DIAGNOSIS — R42 Dizziness and giddiness: Secondary | ICD-10-CM

## 2014-12-30 DIAGNOSIS — Z23 Encounter for immunization: Secondary | ICD-10-CM | POA: Diagnosis not present

## 2014-12-30 NOTE — Progress Notes (Signed)
Nicholas Reddish, MD  Subjective:  Nicholas [name] is a 69 y.o. year old very pleasant male patient who presents with:  Chronic Dizziness -Dizziness at least a year and a half or two years. Thought BP med related in past and adjusted meds without any improvement. wakes up in morning and feels fine then eats breakfast and starts feeling lightheaded. Lasts the rest of the day. ONly thing that makes it better is going to bed. If naps does not get better. Worse with wearing compression sleeve especially on right leg. No other clear precipitants. Constant issue whether sitting, moving around. Previously thought symptoms were worse when his edema was worse. No feeling of room moving.    ROS-Not worse with standing. No tworse with moving head. No headaches.  Past Medical History- melanoma, diastolic CHF   Medications- reviewed and updated Current Outpatient Prescriptions  Medication Sig Dispense Refill  . aspirin EC 81 MG tablet Take 81 mg by mouth at bedtime.    . CRESTOR 10 MG tablet TAKE 1 TABLET ONCE DAILY. 30 tablet 11   No current facility-administered medications for this visit.    Objective: BP 136/78 mmHg  Pulse 96  Temp(Src) 98.7 F (37.1 C)  Wt 223 lb (101.152 kg)  SpO2 94% Gen: NAD, resting comfortably CV: RRR no murmurs rubs or gallops Lungs: CTAB no crackles, wheeze, rhonchi Abdomen: soft/nontender/nondistended/normal bowel sounds. No rebound or guarding.  Ext:  1+ pitting edema on left foot. 1+ pitting edema in left leg. 2-3+ pitting edema in R calf and foot (unchanged from prior) Skin: venous stasis changes noted on right leg with mild erythema   Assessment/Plan:  Dizziness Chronic issue 1.5-2 years. Stopped BP meds and did not resolve. Nonspecific dizziness-not vertigo per description.   Differential Doubt presyncope No changes with sitting to standing- doubt orthostatic Not typical for vertigo Possible anxiety related Normal echo doubt valve disease Possible  atypical seizure but doubt Have not evaluated for TIA/CVA Doubt med related as only on crestor Does have some sleep apnea symptoms but wouldn't be typical to cause chronic dizziness (Daytime sleepiness, snores, nods off easily) Doubt PE (negative venous duplex 02/2015) or pulm hypertension Have done EKG while dizzy and no arhythmia   Plan Discussed CT or MRI to rule out CVA (strongly doubt recurrent TIA) but patient concerned of costs He wants to discuss with neurology before committing to higher cost scan and referred today Neuro will consider EEG but I personally doubt seizure Dont think sleep apnea would cause these symptoms but still considering pulm referral for sleep study  f/u after neuro visit  Orders Placed This Encounter  Procedures  . Pneumococcal conjugate vaccine 13-valent  . Ambulatory referral to Neurology    Referral Priority:  Routine    Referral Type:  Consultation    Referral Reason:  Specialty Services Required    Requested Specialty:  Neurology    Number of Visits Requested:  1

## 2014-12-30 NOTE — Assessment & Plan Note (Signed)
Chronic issue 1.5-2 years. Stopped BP meds and did not resolve. Nonspecific dizziness-not vertigo per description.   Differential Doubt presyncope No changes with sitting to standing- doubt orthostatic Not typical for vertigo Possible anxiety related Normal echo doubt valve disease Possible atypical seizure but doubt Have not evaluated for TIA/CVA Doubt med related as only on crestor Does have some sleep apnea symptoms but wouldn't be typical to cause chronic dizziness (Daytime sleepiness, snores, nods off easily) Doubt PE (negative venous duplex 02/2015) or pulm hypertension Have done EKG while dizzy and no arhythmia   Plan Discussed CT or MRI to rule out CVA (strongly doubt recurrent TIA) but patient concerned of costs He wants to discuss with neurology before committing to higher cost scan and referred today Neuro will consider EEG but I personally doubt seizure Dont think sleep apnea would cause these symptoms but still considering pulm referral for sleep study

## 2014-12-30 NOTE — Patient Instructions (Addendum)
Received final pneumonia shot today ([id number]).  We will call you within a week about your referral to neurology for chronic dizziness. If you do not hear within 2 weeks, give [place] a call.

## 2015-01-14 ENCOUNTER — Ambulatory Visit (INDEPENDENT_AMBULATORY_CARE_PROVIDER_SITE_OTHER): Payer: PPO | Admitting: Neurology

## 2015-01-14 ENCOUNTER — Encounter: Payer: Self-pay | Admitting: Neurology

## 2015-01-14 ENCOUNTER — Other Ambulatory Visit (INDEPENDENT_AMBULATORY_CARE_PROVIDER_SITE_OTHER): Payer: PPO

## 2015-01-14 VITALS — BP 150/88 | HR 105 | Ht 66.5 in | Wt 222.0 lb

## 2015-01-14 DIAGNOSIS — R29898 Other symptoms and signs involving the musculoskeletal system: Secondary | ICD-10-CM | POA: Diagnosis not present

## 2015-01-14 DIAGNOSIS — R42 Dizziness and giddiness: Secondary | ICD-10-CM

## 2015-01-14 DIAGNOSIS — R498 Other voice and resonance disorders: Secondary | ICD-10-CM | POA: Diagnosis not present

## 2015-01-14 DIAGNOSIS — R258 Other abnormal involuntary movements: Secondary | ICD-10-CM | POA: Diagnosis not present

## 2015-01-14 DIAGNOSIS — R269 Unspecified abnormalities of gait and mobility: Secondary | ICD-10-CM

## 2015-01-14 DIAGNOSIS — G238 Other specified degenerative diseases of basal ganglia: Secondary | ICD-10-CM

## 2015-01-14 DIAGNOSIS — G232 Striatonigral degeneration: Secondary | ICD-10-CM

## 2015-01-14 DIAGNOSIS — G20C Parkinsonism, unspecified: Secondary | ICD-10-CM

## 2015-01-14 LAB — CREATININE, SERUM: Creatinine, Ser: 0.99 mg/dL (ref 0.40–1.50)

## 2015-01-14 LAB — BUN: BUN: 28 mg/dL — AB (ref 6–23)

## 2015-01-14 NOTE — Patient Instructions (Addendum)
CT head without contrast CT angiogram head and neck Return to clinic in 6 weeks

## 2015-01-14 NOTE — Progress Notes (Signed)
[facility] Clinic Note - Initial Visit   Date: 01/14/2015   Nicholas [name] MRN: [id number] DOB: 24-Oct-1945   Dear Dr. Yong Channel:  Thank you for your kind referral of Nicholas [name] for consultation of dizziness. Although his history is well known to you, please allow [place] to reiterate it for the purpose of our medical record. The patient was accompanied to the clinic by self.   History of Present Illness: Nicholas [name] is a 69 y.o. right-handed Caucasian male with hyperlipidemia, melanoma s/p toe resection, former smoker, venous insufficiency, and hypertension presenting for evaluation of dizziness.    Starting in 2015, he started experiencing intermittent spells of lightheadedness.  There is no room spinning sensation.  It is worse after he eats.  He has tried using compression stocking which makes it worse.  Laying down improves the symptoms.  His PCP has tried to adjust his BP medications to eliminate this as a potential medication effect, but there has been no change.    He has numbness of the soles of the feet.  He feels as if he is walking on balloons.  His balance is fair, no falls and he walks independently.  Denies headaches, vision problems, swallowing/talking, or limb weakness.    His voice has become softer over the past year and he has noticed that his hand writing is smaller and where he was previously able to write calligraphy, his penmanship is much poorer now.  He endorses constipation.  No RLS or vivid dreams.    He also says that sometimes if he has been sitting for a long period of time, he feels as if he is frozen and can't get up. It takes several seconds from him to get moving, but once it is walking, he is fine.  No weakness.  Out-side paper records, electronic medical record, and images have been reviewed where available and summarized as:  Lab Results  Component Value Date   HGBA1C 5.8 08/25/2013   Lab Results  Component Value Date    TSH 1.71 02/20/2014   Lab Results  Component Value Date   VITAMINB12 321 01/19/2012     Past Medical History  Diagnosis Date  . Hyperlipidemia   . Hypertension   . 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 Removal of melanoma of left foot/amputation of toes 1&25 Jul 2006  . 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 Wrist fracture surgery      69 years old-set     Medications:  Current Outpatient Prescriptions on File Prior to Visit  Medication Sig Dispense Refill  . aspirin EC 81 MG tablet Take 81 mg by mouth at bedtime.    . CRESTOR 10 MG tablet TAKE 1 TABLET ONCE DAILY. 30 tablet 11   No current facility-administered medications on file prior to visit.    Allergies: No Known Allergies  Family History: Family History  Problem Relation Age of Onset  . Hypertension Father   . CVA Father     age 66  . Hyperlipidemia Father   . Other Mother     Deceased    Social History: History   Social History  . Marital Status: Married    Spouse Name: N/A  . Number of Children: N/A  . Years of Education: N/A   Occupational History  . Not on file.  Social History Main Topics  . Smoking status: Former Smoker -- 1.00 packs/day for 16 years    Types: Cigarettes    Quit date: 12/22/1993  . Smokeless tobacco: Not on file  . Alcohol Use: 12.6 oz/week    21 Standard drinks or equivalent per week     Comment: Drink 1-2 glasses of wine nightly  . Drug Use: No  . Sexual Activity: Not on file   Other Topics Concern  . Not on file   Social History Narrative   Married 1981. Wife has kids-1 with 1 adopted grandchild.       Retired Tax adviser      Highest level of education:  B.S.      Hobbies: antiques, former Air cabin crew      Exercise: none currently.     Review of Systems:  CONSTITUTIONAL: No fevers, chills, night sweats, or weight loss.   EYES: No visual changes or eye  pain ENT: No hearing changes.  No history of nose bleeds.   RESPIRATORY: No cough, wheezing and shortness of breath.   CARDIOVASCULAR: Negative for chest pain, and palpitations. +swelling   GI: Negative for abdominal discomfort, blood in stools or black stools.  No recent change in bowel habits.   GU:  No history of incontinence.   MUSCLOSKELETAL: No history of joint pain or swelling.  No myalgias.   SKIN: Negative for lesions, rash, and itching.   HEMATOLOGY/ONCOLOGY: Negative for prolonged bleeding, bruising easily, and swollen nodes.  +history of cancer.   ENDOCRINE: Negative for cold or heat intolerance, polydipsia or goiter.   PSYCH:  No depression or anxiety symptoms.   NEURO: As Above.   Vital Signs:  BP 150/88 mmHg  Pulse 105  Ht 5' 6.5" (1.689 m)  Wt 222 lb (100.699 kg)  BMI 35.30 kg/m2  SpO2 93%    General Medical Exam:   General:  Well appearing, comfortable.   Eyes/ENT: see cranial nerve examination.   Neck: No masses appreciated.  Full range of motion without tenderness.  No carotid bruits. Respiratory:  Clear to auscultation, good air entry bilaterally.   Cardiac:  Regular rate and rhythm, no murmur.   Extremities:  Bilateral lower extremity lymphedema (significant), s/p left toe amputation Skin:  No rashes or lesions.  Neurological Exam: MENTAL STATUS including orientation to time, place, person, recent and remote memory, attention span and concentration, language, and fund of knowledge is normal.  Speech is not dysarthric, slightly hypophonic.  Blunted affect, smiles appropriately.  CRANIAL NERVES: II:  No visual field defects.  Unremarkable fundi.   III-IV-VI: Pupils equal round and reactive to light.  Normal conjugate, extra-ocular eye movements in all directions of gaze.  No nystagmus.  No ptosis.   V:  Normal facial sensation.  Jaw jerk is present.   VII:  Normal facial symmetry and movements. Palmomental, Snout, and Myerson's signs are present. VIII:   Normal hearing and vestibular function.   IX-X:  Normal palatal movement.   XI:  Normal shoulder shrug and head rotation.   XII:  Normal tongue strength and range of motion, no deviation or fasciculation.  MOTOR:  No atrophy, fasciculations or abnormal movements.  No pronator drift.    Right Upper Extremity:    Left Upper Extremity:    Deltoid  5/5   Deltoid  5/5   Biceps  5/5   Biceps  5/5   Triceps  5/5   Triceps  5/5   Wrist extensors  5/5   Wrist extensors  5/5   Wrist flexors  5/5   Wrist flexors  5/5   Finger extensors  5/5   Finger extensors  5/5   Finger flexors  5/5   Finger flexors  5/5   Dorsal interossei  5/5   Dorsal interossei  5/5   Abductor pollicis  5/5   Abductor pollicis  5/5   Tone (Ashworth scale)  0+  Tone (Ashworth scale)  0   Right Lower Extremity:    Left Lower Extremity:    Hip flexors  5/5   Hip flexors  5/5   Hip extensors  5/5   Hip extensors  5/5   Knee flexors  5/5   Knee flexors  5/5   Knee extensors  5/5   Knee extensors  5/5   Dorsiflexors  5/5   Dorsiflexors  5/5   Plantarflexors  5/5   Plantarflexors  5/5   Toe extensors  5/5   Toe extensors  5/5   Toe flexors  5/5   Toe flexors  5/5   Tone (Ashworth scale)  0  Tone (Ashworth scale)  0   MSRs:  Right                                                                 Left brachioradialis 2+  brachioradialis 2+  biceps 2+  biceps 2+  triceps 2+  triceps 2+  patellar 2+  patellar 2+  ankle jerk 0  ankle jerk 0  Hoffman no  Hoffman no  plantar response down  plantar response down   SENSORY:  Normal and symmetric perception of light touch, pinprick, vibration, and proprioception.  Romberg's sign absent.   COORDINATION/GAIT: Normal finger-to- nose-finger.  Intact rapid alternating movements bilaterally.  Able to rise from a chair without using arms.  Gait is wide-based due to body habitus.  Very unsteady with tandem gait.   IMPRESSION: Mr. Weller is a 69 year-old gentleman presenting for  evaluation of sensation of lightheadedness.  His exam disclose mild parkinsonian features (blunted affect, pathological facial reflexes, increased tone in RUE).  Movements are not slowed and there is no tremor.  With his spells of freezing, lightheadedness, and exam findings, parkinson plus disorder such as multiple systems atrophy is a consideration.  To exclude other cause for his episodic lightheadedness such as vascular stenosis, CT angiogram of the head and neck will be ordered.  Medical management may be difficult with his significant leg edema, as medication such as florinef can make this worse, would favor midodrine with close BP monitoring.  Return to clinic in 6 weeks  The duration of this appointment visit was 50 minutes of face-to-face time with the patient.  Greater than 50% of this time was spent in counseling, explanation of diagnosis, planning of further management, and coordination of care.   Thank you for allowing me to participate in patient's care.  If I can answer any additional questions, I would be pleased to do so.    Sincerely,    Donika K. Posey Pronto, DO

## 2015-01-19 ENCOUNTER — Telehealth: Payer: Self-pay | Admitting: Neurology

## 2015-01-19 NOTE — Telephone Encounter (Signed)
CT/A head authorization [id number], dated 01/18/2015.

## 2015-01-21 ENCOUNTER — Ambulatory Visit
Admission: RE | Admit: 2015-01-21 | Discharge: 2015-01-21 | Disposition: A | Discharge status: Home / Self Care | Payer: PPO | Source: Ambulatory Visit | Attending: Neurology | Admitting: Neurology

## 2015-01-21 ENCOUNTER — Other Ambulatory Visit: Payer: PPO

## 2015-01-21 ENCOUNTER — Telehealth: Payer: Self-pay | Admitting: Family Medicine

## 2015-01-21 DIAGNOSIS — R29898 Other symptoms and signs involving the musculoskeletal system: Secondary | ICD-10-CM

## 2015-01-21 DIAGNOSIS — R258 Other abnormal involuntary movements: Secondary | ICD-10-CM

## 2015-01-21 DIAGNOSIS — R42 Dizziness and giddiness: Secondary | ICD-10-CM

## 2015-01-21 DIAGNOSIS — R498 Other voice and resonance disorders: Secondary | ICD-10-CM

## 2015-01-21 DIAGNOSIS — R269 Unspecified abnormalities of gait and mobility: Secondary | ICD-10-CM

## 2015-01-21 DIAGNOSIS — E041 Nontoxic single thyroid nodule: Secondary | ICD-10-CM

## 2015-01-21 MED ORDER — IOPAMIDOL (ISOVUE-370) INJECTION 76%
75.0000 mL | Freq: Once | INTRAVENOUS | Status: AC | PRN
  Administered 2015-01-21: 75 mL via INTRAVENOUS

## 2015-01-21 NOTE — Telephone Encounter (Signed)
[name]- just FYI and also to thank you for informing us of nodule.   Nicholas [name], please inform patient of thyroid ultrasound that I have ordered. He will be called about scheduling. If he does not hear within a week, have him give [place] a call. Depending on findings, he may need a biopsy through endocrine referral.

## 2015-01-21 NOTE — Telephone Encounter (Signed)
-----   Message from Alda Berthold, DO sent at 01/21/2015  5:28 PM EDT ----- Caryl Pina, please inform patient that his vessels look good without any significant narrowing.  There is a small thyroid nodule that was seen and I will ask his PCP to look into to see if US of the thyroid is needed.

## 2015-01-22 NOTE — Telephone Encounter (Signed)
Pt returned call and notified.

## 2015-01-22 NOTE — Telephone Encounter (Signed)
Called and lm for pt tcb. 

## 2015-01-28 ENCOUNTER — Ambulatory Visit
Admission: RE | Admit: 2015-01-28 | Discharge: 2015-01-28 | Disposition: A | Discharge status: Home / Self Care | Payer: PPO | Source: Ambulatory Visit | Attending: Family Medicine | Admitting: Family Medicine

## 2015-01-28 ENCOUNTER — Telehealth: Payer: Self-pay

## 2015-01-28 DIAGNOSIS — E079 Disorder of thyroid, unspecified: Secondary | ICD-10-CM

## 2015-01-28 DIAGNOSIS — E041 Nontoxic single thyroid nodule: Secondary | ICD-10-CM

## 2015-01-28 DIAGNOSIS — E0789 Other specified disorders of thyroid: Secondary | ICD-10-CM

## 2015-01-28 NOTE — Telephone Encounter (Signed)
Referral entered  

## 2015-03-08 ENCOUNTER — Other Ambulatory Visit: Payer: Self-pay | Admitting: Endocrinology

## 2015-03-08 ENCOUNTER — Encounter: Payer: Self-pay | Admitting: Endocrinology

## 2015-03-08 ENCOUNTER — Ambulatory Visit (INDEPENDENT_AMBULATORY_CARE_PROVIDER_SITE_OTHER): Payer: PPO | Admitting: Endocrinology

## 2015-03-08 VITALS — BP 150/100 | HR 93 | Temp 98.2°F | Resp 14 | Ht 66.5 in | Wt 219.4 lb

## 2015-03-08 DIAGNOSIS — E041 Nontoxic single thyroid nodule: Secondary | ICD-10-CM

## 2015-03-08 NOTE — Progress Notes (Signed)
Patient ID: Nicholas [name], male   DOB: 23-Feb-1946, 69 y.o.   MRN: [id number]           Reason for Appointment: Evaluation of thyroid nodule    History of Present Illness:   The patient's thyroid nodule was first discovered when he was having a CT scan of his neck ordered by the neurologist for other reasons He was found to have a thyroid nodule on the right side He has not felt any discomfort or swelling in his neck and has not been told by his PCP to have a palpable thyroid nodule at any time    The thyroid ultrasound showed a solid 1.7 cm right-sided thyroid nodule without any other nodules or thyroid abnormality No recent thyroid levels are available   Lab Results  Component Value Date   TSH 1.71 02/20/2014   TSH 1.60 08/25/2013   TSH 1.52 04/24/2012      Medication List       This list is accurate as of: 03/08/15  9:29 PM.  Always use your most recent med list.               ADVIL PM PO  Take by mouth.     aspirin EC 81 MG tablet  Take 81 mg by mouth at bedtime.     CRESTOR 10 MG tablet  Generic drug:  rosuvastatin  TAKE 1 TABLET ONCE DAILY.        Allergies: No Known Allergies  Past Medical History  Diagnosis Date  . Hyperlipidemia   . Hypertension   . Cancer Jan 2008    skin; hx of melanoma left foot/ amputation of toes 1&2     There is no history of radiation to the neck in childhood  Past Surgical History  Procedure Laterality Date  . Removal of melanoma of left foot/amputation of toes 1&25 Jul 2006  . 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 Wrist fracture surgery      69 years old-set    Family History  Problem Relation Age of Onset  . Hypertension Father   . CVA Father     age 76  . Hyperlipidemia Father   . Other Mother     Deceased    Social History:  reports that he quit smoking about 21 years ago. His smoking use included Cigarettes. He has a 16 pack-year smoking history. He does not have any smokeless tobacco history on file.  He reports that he drinks about 12.6 oz of alcohol per week. He reports that he does not use illicit drugs.   Review of Systems:  No history of recent weight change  No unusual fatigue, heat or cold intolerance  There is a  history of high blood pressure.             No history of Diabetes.      GI: No change in bowel habits         Examination:   BP 150/100 mmHg  Pulse 93  Temp(Src) 98.2 F (36.8 C)  Resp 14  Ht 5' 6.5" (1.689 m)  Wt 219 lb 6.4 oz (99.519 kg)  BMI 34.89 kg/m2  SpO2 97%   General Appearance:  well-looking        Eyes: No  prominence or swelling of the eyes         THYROID: Thyroid nodule is not clearly palpable, has mild fullness on the right medial upper area and no enlargement of  the left side felt  There is no lymphadenopathy in the neck  Heart sounds normal Lungs clear Reflexes at biceps are normal.  Skin: No rash or lesions Extremities: No edema  Assessment/Plan:  Thyroid nodule: He has a solitary solid nodule which is 1.7 cm He has no symptoms suggestive of hyperthyroidism or hypothyroidism  Showed the patient the appearance of thyroid nodules on a anatomical model  Discussed that it in general 95% of thyroid nodules are benign but for further diagnosis especially with his male sex will need to consider needle aspiration and he agrees.  This will be scheduled and further management depends on the results    [name] 03/08/2015

## 2015-03-11 ENCOUNTER — Encounter: Payer: Self-pay | Admitting: Gastroenterology

## 2015-03-15 ENCOUNTER — Ambulatory Visit: Payer: PPO | Admitting: Neurology

## 2015-03-30 ENCOUNTER — Other Ambulatory Visit (HOSPITAL_COMMUNITY)
Admission: RE | Admit: 2015-03-30 | Discharge: 2015-03-30 | Disposition: A | Discharge status: Home / Self Care | Payer: PPO | Source: Ambulatory Visit | Attending: Interventional Radiology | Admitting: Interventional Radiology

## 2015-03-30 ENCOUNTER — Ambulatory Visit
Admission: RE | Admit: 2015-03-30 | Discharge: 2015-03-30 | Disposition: A | Discharge status: Home / Self Care | Payer: PPO | Source: Ambulatory Visit | Attending: Endocrinology | Admitting: Endocrinology

## 2015-03-30 DIAGNOSIS — E041 Nontoxic single thyroid nodule: Secondary | ICD-10-CM

## 2015-04-05 ENCOUNTER — Telehealth: Payer: Self-pay | Admitting: Endocrinology

## 2015-04-05 NOTE — Telephone Encounter (Signed)
Left pt a Vm to return call for pt lab results

## 2015-04-05 NOTE — Progress Notes (Signed)
Quick Note:  Please let patient know that the biopsy showed benign cells although only a few cells obtained, not a matter of concern. Recommend doing another ultrasound in one year and follow-up exam  ______

## 2015-04-05 NOTE — Telephone Encounter (Signed)
Pt returned phone call, please call pt back concerning lab results

## 2015-04-09 ENCOUNTER — Encounter: Payer: Self-pay | Admitting: Neurology

## 2015-04-09 ENCOUNTER — Ambulatory Visit (INDEPENDENT_AMBULATORY_CARE_PROVIDER_SITE_OTHER): Payer: PPO | Admitting: Neurology

## 2015-04-09 ENCOUNTER — Other Ambulatory Visit (INDEPENDENT_AMBULATORY_CARE_PROVIDER_SITE_OTHER): Payer: PPO

## 2015-04-09 VITALS — BP 120/86 | HR 91 | Ht 66.5 in | Wt 220.2 lb

## 2015-04-09 DIAGNOSIS — G2581 Restless legs syndrome: Secondary | ICD-10-CM

## 2015-04-09 DIAGNOSIS — G238 Other specified degenerative diseases of basal ganglia: Secondary | ICD-10-CM

## 2015-04-09 DIAGNOSIS — R498 Other voice and resonance disorders: Secondary | ICD-10-CM

## 2015-04-09 DIAGNOSIS — G232 Striatonigral degeneration: Secondary | ICD-10-CM

## 2015-04-09 DIAGNOSIS — R42 Dizziness and giddiness: Secondary | ICD-10-CM

## 2015-04-09 DIAGNOSIS — R258 Other abnormal involuntary movements: Secondary | ICD-10-CM

## 2015-04-09 DIAGNOSIS — G20C Parkinsonism, unspecified: Secondary | ICD-10-CM

## 2015-04-09 LAB — VITAMIN B12: VITAMIN B 12: 310 pg/mL (ref 211–911)

## 2015-04-09 LAB — TSH: TSH: 0.83 u[IU]/mL (ref 0.35–4.50)

## 2015-04-09 MED ORDER — ROPINIROLE HCL 0.25 MG PO TABS: ORAL_TABLET | ORAL | Status: DC

## 2015-04-09 NOTE — Patient Instructions (Addendum)
1. Check blood work 2. Start ropinorole 0.25mg  at bedtime for one week, then increase to 1 tablet twice daily.  If it makes your lightheadedness worse, stop the medication. 3  Call with an update in 1 month, if no improvement, will order MRI brain 4. Continue to avoid alcohol 5. Return to clinic in 3 months

## 2015-04-09 NOTE — Progress Notes (Signed)
Follow-up Visit   Date: 04/09/2015   Nicholas [name] MRN: [id number] DOB: Dec 14, 1945   Interim History: Nicholas [name] is a 69 y.o. right-handed Caucasian male with hyperlipidemia, melanoma s/p toe resection, former smoker, venous insufficiency, and hypertension returning to the clinic for follow-up of parkinson plus syndrome.  The patient was accompanied to the clinic by wife who also provides collateral information.    History of present illness: Starting in 2015, he started experiencing intermittent spells of lightheadedness. There is no room spinning sensation. It is worse after he eats. He has tried using compression stocking which makes it worse. Laying down improves the symptoms. His PCP has tried to adjust his BP medications to eliminate this as a potential medication effect, but there has been no change.   He has numbness of the soles of the feet. He feels as if he is walking on balloons. His balance is fair, no falls and he walks independently. Denies headaches, vision problems, swallowing/talking, or limb weakness.   His voice has become softer over the past year and he has noticed that his hand writing is smaller and where he was previously able to write calligraphy, his penmanship is much poorer now. He endorses constipation. No RLS or vivid dreams.   He also says that sometimes if he has been sitting for a long period of time, he feels as if he is frozen and can't get up. It takes several seconds from him to get moving, but once it is walking, he is fine. No weakness.   UPDATE 04/09/2015:  He reports drinking 2-4 glasses of wine night and quit drinking about three weeks ago and has noticed improved lightheadedness.  He continues to have freezing spells, especially when initiating walking.  He has history of RLS and manages discomfort with advil at night.   No falls. No new complaints.    Medications:  Current Outpatient Prescriptions on File Prior to Visit    Medication Sig Dispense Refill  . aspirin EC 81 MG tablet Take 81 mg by mouth at bedtime.    . CRESTOR 10 MG tablet TAKE 1 TABLET ONCE DAILY. 30 tablet 11  . Ibuprofen-Diphenhydramine Cit (ADVIL PM PO) Take by mouth.     No current facility-administered medications on file prior to visit.    Allergies: No Known Allergies  Review of Systems:  CONSTITUTIONAL: No fevers, chills, night sweats, or weight loss.  EYES: No visual changes or eye pain ENT: No hearing changes.  No history of nose bleeds.   RESPIRATORY: No cough, wheezing and shortness of breath.   CARDIOVASCULAR: Negative for chest pain, and palpitations.   GI: Negative for abdominal discomfort, blood in stools or black stools.  No recent change in bowel habits.   GU:  No history of incontinence.   MUSCLOSKELETAL: No history of joint pain or swelling.  No myalgias.   SKIN: Negative for lesions, rash, and itching.   ENDOCRINE: Negative for cold or heat intolerance, polydipsia or goiter.   PSYCH:  No depression or anxiety symptoms.   NEURO: As Above.   Vital Signs:  BP 120/86 mmHg  Pulse 91  Ht 5' 6.5" (1.689 m)  Wt 220 lb 3 oz (99.876 kg)  BMI 35.01 kg/m2  SpO2 93%  Neurological Exam: MENTAL STATUS including orientation to time, place, person, recent and remote memory, attention span and concentration, language, and fund of knowledge is normal.  Speech is not dysarthric.  Blunted affect.  CRANIAL NERVES:  Pupils equal  round and reactive to light.  Normal conjugate, extra-ocular eye movements in all directions of gaze.  No ptosis. Normal facial sensation.  Face is symmetric. Palate elevates symmetrically.  Tongue is midline.  MOTOR:  Motor strength is 5/5 in all extremities.  No pronator drift.  Cogwheel rigidity in the RUE.  Marked lymphedema bilaterally.  MSRs:  Reflexes are 2+/4 throughout, except absent Achilles bilaterally.  COORDINATION/GAIT:  Normal finger-to- nose-finger and heel-to-shin.  Intact rapid  alternating movements bilaterally. Gait is wide-based due to body habitus   Data: CTA head and neck 01/21/2015: 1. Mild atherosclerotic calcifications at the aortic arch and the left cavernous internal carotid artery without significant stenosis. 2. Tortuosity of the cervical internal carotid arteries bilaterally without significant stenosis. 3. 1.4 cm right thyroid nodule. Consider further evaluation with thyroid ultrasound. If patient is clinically hyperthyroid, consider nuclear medicine thyroid uptake and scan.  IMPRESSION/PLAN: 1. Parkinson plus syndrome, ?multiple system atrophy Exam shows blunted affect, pathological facial reflexes, increased tone in RUE). Movements are not slowed and there is no tremor. With his spells of freezing, lightheadedness, and exam findings, parkinson plus disorder such as multiple systems atrophy is a consideration.  Trial of dopamine agonist, need to be cautious given his orthostasis  2. Restless leg syndrome Start ropinorole as noted below  3. Othostasis, lightheadedness Medical management may be difficult with his significant leg edema, as medication such as florinef can make this worse, would favor midodrine with close BP monitoring. Check labs   PLAN/RECOMMENDATIONS:  1.  Check vitamin B12, Vitamin B1, TSH, copper 2.  Start ropinorole 0.25mg  at bedtime, then increase to 1 tablet twice daily. Side effects discussed 3.  Alcohol cessation encouraged 4.  Call with update in 1 month, if no improvement proceed with MRI brain  Return to clinic in 3 months   The duration of this appointment visit was 25 minutes of face-to-face time with the patient.  Greater than 50% of this time was spent in counseling, explanation of diagnosis, planning of further management, and coordination of care.   Thank you for allowing me to participate in patient's care.  If I can answer any additional questions, I would be pleased to do so.    Sincerely,    Donika  K. Posey Pronto, DO

## 2015-04-11 LAB — COPPER, SERUM: Copper: 88 ug/dL (ref 70–175)

## 2015-04-13 LAB — VITAMIN B1: Vitamin B1 (Thiamine): <7 nmol/L — ABNORMAL LOW (ref 8–30)

## 2015-04-15 ENCOUNTER — Encounter: Payer: Self-pay | Admitting: Neurology

## 2015-04-20 ENCOUNTER — Encounter: Payer: Self-pay | Admitting: *Deleted

## 2015-04-29 ENCOUNTER — Ambulatory Visit (HOSPITAL_BASED_OUTPATIENT_CLINIC_OR_DEPARTMENT_OTHER): Payer: PPO | Admitting: Oncology

## 2015-04-29 ENCOUNTER — Telehealth: Payer: Self-pay | Admitting: Oncology

## 2015-04-29 VITALS — BP 151/90 | HR 84 | Temp 97.8°F | Resp 17 | Ht 66.5 in | Wt 226.5 lb

## 2015-04-29 DIAGNOSIS — R609 Edema, unspecified: Secondary | ICD-10-CM

## 2015-04-29 DIAGNOSIS — Z85820 Personal history of malignant melanoma of skin: Secondary | ICD-10-CM | POA: Diagnosis not present

## 2015-04-29 DIAGNOSIS — C439 Malignant melanoma of skin, unspecified: Secondary | ICD-10-CM

## 2015-04-29 NOTE — Telephone Encounter (Signed)
Gave and printed appt sched and avs fo rpt for OCT 2017 °

## 2015-04-29 NOTE — Progress Notes (Signed)
  [place] OFFICE PROGRESS NOTE   Diagnosis: Melanoma  INTERVAL HISTORY:   Nicholas [name] returns as scheduled. He feels well. Good appetite. He has been evaluated by multiple physicians for bilateral lower leg swelling. There is been no explanation for the leg swelling. He wears a lymphedema sleeve on the left leg. He has been diagnosed with vitamin B 1 deficiency by neurology and is taking a supplement.  Objective:  Vital signs in last 24 hours:  Blood pressure 151/90, pulse 84, temperature 97.8 F (36.6 C), temperature source Oral, resp. rate 17, height 5' 6.5" (1.689 m), weight 226 lb 8 oz (102.74 kg), SpO2 100 %.    HEENT: Neck without mass Lymphatics: No cervical, supra-clavicular, axillary, or inguinal nodes Resp: Lungs clear bilaterally Cardio: Regular rate and rhythm GI: No hepatosplenomegaly, no mass Vascular: 2+ edema below the knee bilaterally  Skin: Left foot amputation site without evidence of recurrent tumor. Left popliteal and groin scar is without evidence of recurrent tumor. Multiple benign appearing moles over the trunk and extremities.   Medications: I have reviewed the patient's current medications.  Assessment/Plan: 1. 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 2. 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 3. Left leg edema, stable. He uses a compression stocking intermittently. 4. History of dizziness, anorexia, and weight loss secondary to interferon, resolved. 5. A 1.2 cm precarinal lymph node was seen on a PET scan in December 2007 without associated increased FDG activity. A PET scan 09/16/2007 revealed no evidence for recurrent melanoma. 6. Status post excision of an in situ  melanoma at the left 4th toe 03/26/2008. 7. Left and Right low leg/ankle edema: Present for at least 3 years, etiology unclear    Disposition:  Nicholas [name] remains in clinical remission from melanoma. He continues follow-up with Dr. Ronnald Ramp and would like to continue a yearly appointment at the Cancer center. The left leg edema could be secondary to lymphedema from the left groin and popliteal biopsies. However the right leg edema should not be related to this. He will continue follow-up with Dr. Yong Channel for evaluation/management of the leg edema.  Betsy Coder, MD  04/29/2015  3:57 PM

## 2015-05-04 ENCOUNTER — Encounter: Payer: Self-pay | Admitting: Family Medicine

## 2015-05-04 ENCOUNTER — Ambulatory Visit (INDEPENDENT_AMBULATORY_CARE_PROVIDER_SITE_OTHER): Payer: PPO | Admitting: Family Medicine

## 2015-05-04 VITALS — BP 144/92 | HR 93 | Temp 98.7°F | Wt 225.0 lb

## 2015-05-04 DIAGNOSIS — Z23 Encounter for immunization: Secondary | ICD-10-CM

## 2015-05-04 DIAGNOSIS — I872 Venous insufficiency (chronic) (peripheral): Secondary | ICD-10-CM

## 2015-05-04 DIAGNOSIS — M79605 Pain in left leg: Secondary | ICD-10-CM

## 2015-05-04 NOTE — Patient Instructions (Addendum)
Flu shot received today.  This just is bad bruise/possible deep bleed or hematoma. Will take several weeks if not over a month with your poor drainage to heal. Can use ice 15-20 minutes 3x a day for 3 days then switch to heat. May help but no guarantee.   If worsening pain or swelling in leg return to see [place] or if any other concerns

## 2015-05-04 NOTE — Progress Notes (Signed)
[name], MD  Subjective:  Nicholas [name] is a 69 y.o. year old very pleasant male patient who presents for/with See problem oriented charting ROS- no shortness of breath, blurry vision, does have persistent lightheadedness ongoing issue for 2 years- was worse on ropinrole- has planned neuro follow up.   Past Medical History-  Patient Active Problem List   Diagnosis Date Noted  . Diastolic CHF ([place]) 75/91/6384    Priority: High  . MALIGNANT MELANOMA SKIN LOWER LIMB INCLUDING HIP 03/09/2009    Priority: High  . Chronic venous insufficiency 02/20/2014    Priority: Medium  . Hyperglycemia 08/02/2012    Priority: Medium  . Hyperlipidemia 12/13/2006    Priority: Medium  . Essential hypertension 12/13/2006    Priority: Medium  . Former smoker 04/29/2014    Priority: Low  . Obesity 07/13/2010    Priority: Low  . Insomnia 10/03/2007    Priority: Low  . Dizziness 12/30/2014    Medications- reviewed and updated Current Outpatient Prescriptions  Medication Sig Dispense Refill  . aspirin EC 81 MG tablet Take 81 mg by mouth at bedtime. Pt taking as needed    . CRESTOR 10 MG tablet TAKE 1 TABLET ONCE DAILY. 30 tablet 11  . Ibuprofen-Diphenhydramine Cit (ADVIL PM PO) Take by mouth.     No current facility-administered medications for this visit.    Objective: BP 144/92 mmHg  Pulse 93  Temp(Src) 98.7 F (37.1 C)  Wt 225 lb (102.059 kg) Gen: NAD, resting comfortably CV: RRR no murmurs rubs or gallops Lungs: CTAB no crackles, wheeze, rhonchi Abdomen: soft/nontender/nondistended/normal bowel sounds. No rebound or guarding.  Ext: 2+ pitting edema on left foot. 2+ pitting edema in left leg. 2-3+ pitting edema in R calf and foot. On left leg 8 x 8 cm mildly warm raised area tender to touch Skin: warm, dry Neuro: masked faces   Assessment/Plan:  Chronic venous insufficiency Left leg pain S: has been intermittently compliant with compression stockings. Swelling has persisted  but is helped some by stockigns. About a week ago Tripped down step and luggage hit anterior shin L leg when he was out of town. No fall down stairs. Luggage hit pretty hard. Within a few hours noted red raised area on left anterior shin. 2/10 pain without doing anything and walks without difficulty- with pushing on area up to 8/10. Edema in other portion of left leg has worsened some.  A/P: venous insufficiency appears largely stable- some acute worsening on left after injury. Appears to be potential hematoma. Discussed prolonged time to likely reabsorb. Home treatment discussed with sooner return precautions given. Once pain improves- encouraged regular compliance with compression stockings.    watch BP- likely up due to some discomfort  Return precautions advised.   Orders Placed This Encounter  Procedures  . Flu Vaccine QUAD 36+ mos IM

## 2015-05-04 NOTE — Assessment & Plan Note (Signed)
Left leg pain S: has been intermittently compliant with compression stockings. Swelling has persisted but is helped some by stockigns. About a week ago Tripped down step and luggage hit anterior shin L leg when he was out of town. No fall down stairs. Luggage hit pretty hard. Within a few hours noted red raised area on left anterior shin. 2/10 pain without doing anything and walks without difficulty- with pushing on area up to 8/10. Edema in other portion of left leg has worsened some.  A/P: venous insufficiency appears largely stable- some acute worsening on left after injury. Appears to be potential hematoma. Discussed prolonged time to likely reabsorb. Home treatment discussed with sooner return precautions given. Once pain improves- encouraged regular compliance with compression stockings.

## 2015-05-10 ENCOUNTER — Encounter: Payer: Self-pay | Admitting: Family Medicine

## 2015-05-10 NOTE — Telephone Encounter (Signed)
PT called and wanted to know how to get an MRI scheduled/Dawn CB# [phone]

## 2015-05-12 ENCOUNTER — Telehealth: Payer: Self-pay | Admitting: Neurology

## 2015-05-12 NOTE — Telephone Encounter (Signed)
Pt's wife called and said her husband had a CT Scan done and now he wants an MRI/Dawn [phone]

## 2015-05-13 NOTE — Telephone Encounter (Signed)
OK to proceed with MRI brain wo contrast.

## 2015-05-13 NOTE — Telephone Encounter (Signed)
Your last note says if doing no better in one month then we will do MRI brain.  Please advise.

## 2015-05-14 ENCOUNTER — Other Ambulatory Visit: Payer: Self-pay | Admitting: *Deleted

## 2015-05-14 DIAGNOSIS — R42 Dizziness and giddiness: Secondary | ICD-10-CM

## 2015-05-14 DIAGNOSIS — G232 Striatonigral degeneration: Secondary | ICD-10-CM

## 2015-05-14 DIAGNOSIS — R498 Other voice and resonance disorders: Secondary | ICD-10-CM

## 2015-05-14 NOTE — Telephone Encounter (Signed)
Patient's wife notified that order has been put in and I requested for her to call to schedule the appointment.

## 2015-05-21 ENCOUNTER — Ambulatory Visit
Admission: RE | Admit: 2015-05-21 | Discharge: 2015-05-21 | Disposition: A | Discharge status: Home / Self Care | Payer: PPO | Source: Ambulatory Visit | Attending: Neurology | Admitting: Neurology

## 2015-05-21 DIAGNOSIS — G232 Striatonigral degeneration: Secondary | ICD-10-CM

## 2015-05-21 DIAGNOSIS — G20C Parkinsonism, unspecified: Secondary | ICD-10-CM

## 2015-05-21 DIAGNOSIS — R42 Dizziness and giddiness: Secondary | ICD-10-CM

## 2015-05-21 DIAGNOSIS — R498 Other voice and resonance disorders: Secondary | ICD-10-CM

## 2015-06-22 ENCOUNTER — Encounter: Payer: Self-pay | Admitting: Family Medicine

## 2015-06-22 ENCOUNTER — Ambulatory Visit (INDEPENDENT_AMBULATORY_CARE_PROVIDER_SITE_OTHER): Payer: PPO | Admitting: Family Medicine

## 2015-06-22 VITALS — BP 140/100 | HR 100 | Temp 98.8°F | Ht 66.0 in | Wt 223.0 lb

## 2015-06-22 DIAGNOSIS — G20C Parkinsonism, unspecified: Secondary | ICD-10-CM | POA: Insufficient documentation

## 2015-06-22 DIAGNOSIS — E785 Hyperlipidemia, unspecified: Secondary | ICD-10-CM | POA: Diagnosis not present

## 2015-06-22 DIAGNOSIS — I10 Essential (primary) hypertension: Secondary | ICD-10-CM

## 2015-06-22 DIAGNOSIS — G232 Striatonigral degeneration: Secondary | ICD-10-CM | POA: Diagnosis not present

## 2015-06-22 MED ORDER — HYDROCHLOROTHIAZIDE 12.5 MG PO CAPS: 12.5000 mg | ORAL_CAPSULE | Freq: Every day | ORAL | Status: DC

## 2015-06-22 MED ORDER — ROSUVASTATIN CALCIUM 10 MG PO TABS: 10.0000 mg | ORAL_TABLET | Freq: Every day | ORAL | Status: DC

## 2015-06-22 NOTE — Assessment & Plan Note (Signed)
S: noncompliant with crestor 10mg . Suspect very poor control. Stopped wondering if could contribute to dizziness but has not improved.  A/P: refilled and strongly advised to take crestor

## 2015-06-22 NOTE — Patient Instructions (Signed)
Refer to [place] [facility] for second opinion. If you do not hear by end of week, give [place] a call.   Restart crestor and start hydrochlorothiazide 12.5mg  for blood pressure  See me within a month for recheck

## 2015-06-22 NOTE — Progress Notes (Signed)
[name], MD  Subjective:  Nicholas [name] is a 69 y.o. year old very pleasant male patient who presents for/with See problem oriented charting ROS- no headache or blurry vision. No vertigo.   Past Medical History-  Patient Active Problem List   Diagnosis Date Noted  . Diastolic CHF ([place]) 123456    Priority: High  . MALIGNANT MELANOMA SKIN LOWER LIMB INCLUDING HIP 03/09/2009    Priority: High  . Chronic venous insufficiency 02/20/2014    Priority: Medium  . Hyperglycemia 08/02/2012    Priority: Medium  . Hyperlipidemia 12/13/2006    Priority: Medium  . Essential hypertension 12/13/2006    Priority: Medium  . Former smoker 04/29/2014    Priority: Low  . Obesity 07/13/2010    Priority: Low  . Insomnia 10/03/2007    Priority: Low  . 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 Dizziness 12/30/2014    Medications- reviewed and updated Current Outpatient Prescriptions  Medication Sig Dispense Refill  . hydrochlorothiazide (MICROZIDE) 12.5 MG capsule Take 1 capsule (12.5 mg total) by mouth daily. 30 capsule 5  . Ibuprofen-Diphenhydramine Cit (ADVIL PM PO) Take by mouth.    . rosuvastatin (CRESTOR) 10 MG tablet Take 1 tablet (10 mg total) by mouth daily. 30 tablet 11   No current facility-administered medications for this visit.    Objective: BP 140/100 mmHg  Pulse 100  Temp(Src) 98.8 F (37.1 C) (Oral)  Ht 5\' 6"  (1.676 m)  Wt 223 lb (101.152 kg)  BMI 36.01 kg/m2  SpO2 100% Gen: NAD, resting comfortably CV: RRR no murmurs rubs or gallops Lungs: CTAB no crackles, wheeze, rhonchi Abdomen: soft/nontender/nondistended/normal bowel sounds. No rebound or guarding.  Ext: 2+ pitting edema on left foot. 2+ pitting edema in left leg. 2-3+ pitting edema in R calf and foot.  Skin: warm, dry, some venous stasis changes Neuro: slow gait, masked facies   Assessment/Plan:  Essential hypertension S: Had dizziness on metoprolol and lisinopril and both eventually were  stopped. Despite these being stopped dizziness persisted. Eventually seen by neurology who thought this may be parkinson plus syndrome. Patient states medicine makes him more dizzy but he has had no improvement off. We discussed stroke risk being higher off medicine and he is willing to restart.  BP Readings from Last 3 Encounters:  06/22/15 140/100  05/04/15 144/92  04/29/15 151/90  A/P: trial hctz 12.5mg  and return 4-6 weeks.    Hyperlipidemia S: noncompliant with crestor 10mg . Suspect very poor control. Stopped wondering if could contribute to dizziness but has not improved.  A/P: refilled and strongly advised to take crestor   Parkinson's plus syndrome [facility]) S: Patient saw [name] who thought dizziness could be a part of parkinson's plus syndrome. Please see her full note in epic/care everywhere for details. Patient is unable to get back in with [name] until January 19th and he simply would like more answers at this point. MRI of brain was normal with [name]. He feels very imbalanced unless walking straight line backwards and forwards but sideways makes him feel even more dizzy. States his speech is weaker and his handwriting is smaller.  A/P: asks for second neurological opinion so referred to [facility] disorder center. He has been plugged in with [facility] in the past with his melanoma history.    Return precautions advised.   Orders Placed This Encounter  Procedures  . Ambulatory referral to Neurology    Referral Priority:  Routine    Referral Type:  Consultation  Referral Reason:  Specialty Services Required    Requested Specialty:  Neurology    Number of Visits Requested:  1  [facility]  Meds ordered this encounter  Medications  . rosuvastatin (CRESTOR) 10 MG tablet    Sig: Take 1 tablet (10 mg total) by mouth daily.    Dispense:  30 tablet    Refill:  11  . hydrochlorothiazide (MICROZIDE) 12.5 MG capsule    Sig: Take 1 capsule (12.5 mg total) by  mouth daily.    Dispense:  30 capsule    Refill:  5

## 2015-06-22 NOTE — Assessment & Plan Note (Signed)
S: Patient saw [name] who thought dizziness could be a part of parkinson's plus syndrome. Please see her full note in epic/care everywhere for details. Patient is unable to get back in with [name] until January 19th and he simply would like more answers at this point. MRI of brain was normal with [name]. He feels very imbalanced unless walking straight line backwards and forwards but sideways makes him feel even more dizzy. States his speech is weaker and his handwriting is smaller.  A/P: asks for second neurological opinion so referred to [facility] disorder center. He has been plugged in with [facility] in the past with his melanoma history.

## 2015-06-22 NOTE — Assessment & Plan Note (Signed)
S: Had dizziness on metoprolol and lisinopril and both eventually were stopped. Despite these being stopped dizziness persisted. Eventually seen by neurology who thought this may be parkinson plus syndrome. Patient states medicine makes him more dizzy but he has had no improvement off. We discussed stroke risk being higher off medicine and he is willing to restart.  BP Readings from Last 3 Encounters:  06/22/15 140/100  05/04/15 144/92  04/29/15 151/90  A/P: trial hctz 12.5mg  and return 4-6 weeks.

## 2015-06-22 NOTE — Progress Notes (Signed)
Pre visit review using our clinic review tool, if applicable. No additional management support is needed unless otherwise documented below in the visit note. 

## 2015-06-23 ENCOUNTER — Encounter: Payer: Self-pay | Admitting: Family Medicine

## 2015-06-28 ENCOUNTER — Telehealth: Payer: Self-pay | Admitting: Family Medicine

## 2015-06-28 NOTE — Telephone Encounter (Signed)
Care everywhere access as note from [facility] when sent here was incomplete.

## 2015-08-12 ENCOUNTER — Encounter: Payer: Self-pay | Admitting: Neurology

## 2015-08-12 ENCOUNTER — Ambulatory Visit (INDEPENDENT_AMBULATORY_CARE_PROVIDER_SITE_OTHER): Payer: PPO | Admitting: Neurology

## 2015-08-12 ENCOUNTER — Other Ambulatory Visit: Payer: PPO

## 2015-08-12 VITALS — BP 120/80 | HR 97 | Wt 224.2 lb

## 2015-08-12 DIAGNOSIS — E519 Thiamine deficiency, unspecified: Secondary | ICD-10-CM | POA: Diagnosis not present

## 2015-08-12 DIAGNOSIS — G20 Parkinson's disease: Secondary | ICD-10-CM | POA: Diagnosis not present

## 2015-08-12 NOTE — Progress Notes (Signed)
Follow-up Visit   Date: 08/12/2015   Nicholas [name] MRN: [id number] DOB: 03-26-1946   Interim History: Nicholas [name] is a 70 y.o. right-handed Caucasian male with hyperlipidemia, melanoma s/p toe resection, former smoker, venous insufficiency, and hypertension returning to the clinic for follow-up of parkinson disease.  The patient was accompanied to the clinic by wife who also provides collateral information.    History of present illness: Starting in 2015, he started experiencing intermittent spells of lightheadedness. There is no room spinning sensation. It is worse after he eats. He has tried using compression stocking which makes it worse. Laying down improves the symptoms. His PCP has tried to adjust his BP medications to eliminate this as a potential medication effect, but there has been no change.   He has numbness of the soles of the feet. He feels as if he is walking on balloons. His balance is fair, no falls and he walks independently. Denies headaches, vision problems, swallowing/talking, or limb weakness.   His voice has become softer over the past year and he has noticed that his hand writing is smaller and where he was previously able to write calligraphy, his penmanship is much poorer now. He endorses constipation. No RLS or vivid dreams.   He also says that sometimes if he has been sitting for a long period of time, he feels as if he is frozen and can't get up. It takes several seconds from him to get moving, but once it is walking, he is fine. No weakness.   UPDATE 04/09/2015:  He reports drinking 2-4 glasses of wine night and quit drinking about three weeks ago and has noticed improved lightheadedness.  He continues to have freezing spells, especially when initiating walking.  He has history of RLS and manages discomfort with advil at night.  UPDATE 08/11/2014:  At his last visit, I started ropinorole but he stopped this within 2-3 days because of  dizziness.  His vitamin levels also disclosed vitamin B1 deficiency for which he is now being supplemented for. He sought a second opinion at [facility] with Dr. Nicki Reaper who diagnosed idiopathic parkinson's disease and started him on sinemet 25/100 titration scale.  He is tolerating sinemet 25/100 1 tablet three times daily and has noticed a marked difference in his walking and movements.  No major side effects with the medication.  He has significantly reduced his alcohol and drinks wine maybe once per week.  He occasionally feels imbalanced especially with turning, but has not had any falls. No issues with RLS, vivid dreams or constipation.    Medications:  Current Outpatient Prescriptions on File Prior to Visit  Medication Sig Dispense Refill  . hydrochlorothiazide (MICROZIDE) 12.5 MG capsule Take 1 capsule (12.5 mg total) by mouth daily. 30 capsule 5  . Ibuprofen-Diphenhydramine Cit (ADVIL PM PO) Take by mouth.    . rosuvastatin (CRESTOR) 10 MG tablet Take 1 tablet (10 mg total) by mouth daily. 30 tablet 11   No current facility-administered medications on file prior to visit.    Allergies: No Known Allergies  Review of Systems:  CONSTITUTIONAL: No fevers, chills, night sweats, or weight loss.  EYES: No visual changes or eye pain ENT: No hearing changes.  No history of nose bleeds.   RESPIRATORY: No cough, wheezing and shortness of breath.   CARDIOVASCULAR: Negative for chest pain, and palpitations.   GI: Negative for abdominal discomfort, blood in stools or black stools.  No recent change in bowel habits.  GU:  No history of incontinence.   MUSCLOSKELETAL: No history of joint pain or swelling.  No myalgias.   SKIN: Negative for lesions, rash, and itching.   ENDOCRINE: Negative for cold or heat intolerance, polydipsia or goiter.   PSYCH:  No depression or anxiety symptoms.   NEURO: As Above.   Vital Signs:  BP 120/80 mmHg  Pulse 97  Wt 224 lb 3 oz (101.691 kg)  SpO2  94%  Neurological Exam: MENTAL STATUS including orientation to time, place, person, recent and remote memory, attention span and concentration, language, and fund of knowledge is normal.  Speech is not dysarthric.  Blunted affect.  CRANIAL NERVES:  Pupils equal round and reactive to light.  Normal conjugate, extra-ocular eye movements in all directions of gaze.  No ptosis. Normal facial sensation.  Face is symmetric. Palate elevates symmetrically.  Tongue is midline.  MOTOR:  Motor strength is 5/5 in all extremities.  No pronator drift.  Cogwheel rigidity in the RUE (slightly improved).  Marked lymphedema bilaterally.  MSRs:  Reflexes are 2+/4 throughout, except absent Achilles bilaterally.  COORDINATION/GAIT:  Normal finger-to- nose-finger and heel-to-shin. Toe tapping on the left is somewhat slowed, otherwise finger tapping and right toe tapping is normal.  Gait is wide-based due to body habitus, normal cadence, speed, and good arm swing bilaterally.    Data: CTA head and neck 01/21/2015: 1. Mild atherosclerotic calcifications at the aortic arch and the left cavernous internal carotid artery without significant stenosis. 2. Tortuosity of the cervical internal carotid arteries bilaterally without significant stenosis. 3. 1.4 cm right thyroid nodule. Consider further evaluation with thyroid ultrasound. If patient is clinically hyperthyroid, consider nuclear medicine thyroid uptake and scan.  Labs 04/09/2015:  Vitamin B12 310, vitamin B1 <7, TSH 0.83, copper 88  MRI brain wo contrast 05/21/2015: 1. Normal MRI of the brain for age. No acute or focal lesion to explain the patient's symptoms. 2. Mild diffuse mucosal disease throughout the paranasal sinuses.  IMPRESSION/PLAN: 1. Akinetic-rigid parkinson's disease, idiopathic. Exam shows blunted affect, pathological facial reflexes, increased tone in RUE.   - Doing well on sinemet 25/100mg  TID which will be continued  - Start LSVT   -  Encouraged to stay active and exercise at least 30-min 3-4 times per week  2. 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  Restless leg syndrome, improved with sinemet  4. Othostasis, lightheadedness - stable Medical management may be difficult with his significant leg edema, as medication such as florinef can make this worse, would favor midodrine with close BP monitoring.  Return to clinic in 4 months   The duration of this appointment visit was 25 minutes of face-to-face time with the patient.  Greater than 50% of this time was spent in counseling, explanation of diagnosis, planning of further management, and coordination of care.   Thank you for allowing me to participate in patient's care.  If I can answer any additional questions, I would be pleased to do so.    Sincerely,    Katia Hannen K. Posey Pronto, DO

## 2015-08-12 NOTE — Patient Instructions (Signed)
1.  Check vitamin B1 level 2.  Continue sinemet 25/100 1 tablet three times daily as you are 3.  Start physical therapy for parkinson's disease  Return to clinic in 4 months

## 2015-08-17 LAB — VITAMIN B1: Vitamin B1 (Thiamine): 69 nmol/L — ABNORMAL HIGH (ref 8–30)

## 2015-08-31 ENCOUNTER — Ambulatory Visit: Payer: PPO | Attending: Neurology | Admitting: Physical Therapy

## 2015-08-31 DIAGNOSIS — R269 Unspecified abnormalities of gait and mobility: Secondary | ICD-10-CM | POA: Diagnosis not present

## 2015-08-31 DIAGNOSIS — R4189 Other symptoms and signs involving cognitive functions and awareness: Secondary | ICD-10-CM | POA: Insufficient documentation

## 2015-08-31 DIAGNOSIS — R258 Other abnormal involuntary movements: Secondary | ICD-10-CM | POA: Diagnosis not present

## 2015-08-31 DIAGNOSIS — R293 Abnormal posture: Secondary | ICD-10-CM | POA: Diagnosis not present

## 2015-08-31 DIAGNOSIS — R2689 Other abnormalities of gait and mobility: Secondary | ICD-10-CM | POA: Insufficient documentation

## 2015-08-31 DIAGNOSIS — R471 Dysarthria and anarthria: Secondary | ICD-10-CM | POA: Insufficient documentation

## 2015-08-31 DIAGNOSIS — R29898 Other symptoms and signs involving the musculoskeletal system: Secondary | ICD-10-CM

## 2015-08-31 DIAGNOSIS — R2681 Unsteadiness on feet: Secondary | ICD-10-CM | POA: Insufficient documentation

## 2015-08-31 DIAGNOSIS — R279 Unspecified lack of coordination: Secondary | ICD-10-CM | POA: Diagnosis not present

## 2015-08-31 NOTE — Therapy (Signed)
[facility] [address] [place], [place], [address] Phone: [phone]   Fax:  [phone]  Physical Therapy Evaluation  Patient Details  Name: Nicholas [name] MRN: [id number] Date of Birth: 05/11/1946 Referring Provider: Posey Pronto ([facility])  Encounter Date: 08/31/2015      PT End of Session - 08/31/15 2149    Visit Number 1   Number of Visits 17   Date for PT Re-Evaluation 10/31/15   Authorization Type Gcode every 10th visit   PT Start Time 1150   PT Stop Time 1235   PT Time Calculation (min) 45 min   Activity Tolerance Patient tolerated treatment well   Behavior During Therapy [facility] for tasks assessed/performed      Past Medical History  Diagnosis Date  . Hyperlipidemia   . Hypertension   . Cancer [facility]) Jan 2008    skin; hx of melanoma left foot/ amputation of toes 1&2     Past Surgical History  Procedure Laterality Date  . Removal of melanoma of left foot/amputation of toes 1&25 Jul 2006  . 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 Wrist fracture surgery      70 years old-set    There were no vitals filed for this visit.  Visit Diagnosis:  Abnormality of gait  Festinating gait  Muscle rigidity  Postural instability      Subjective Assessment - 08/31/15 1156    Subjective Pt is a 70 year old male who presents to OP PT with diagnosis of Parkinson's disease.  He has been to [facility] with diagnosis akinetic rigid Parkinson's sydrome.  He reports difficulty with side to side movements, including turns and changes in direction, especially in crowds.  He reports occasional freezing with gait.  He is now on Sinemet with slight improvements noted.  He reports one fall in the past 6 months, falling down bottom step at a bed and breakfast.   Pertinent History 1007-skin cancer, with toes amptutated LLE; s/p chemotherapy LE edema/lymphedema LLE   Patient Stated Goals Pt's goal for therapy  is to improve movement.   Currently in Pain? No/denies            [facility] PT Assessment - 08/31/15 0001    Assessment   Medical Diagnosis Parkinson's disease   Referring Provider Posey Pronto  Also [facility]   Onset Date/Surgical Date --  February 2016 akinetic rigid Parkinsonism dx   Precautions   Precautions Fall  lymphedema LLE-wears compression stockings   Balance Screen   Has the patient fallen in the past 6 months Yes   How many times? 1   Has the patient had a decrease in activity level because of a fear of falling?  No   Is the patient reluctant to leave their home because of a fear of falling?  No   Home Social worker Private residence   Living Arrangements Spouse/significant other   Available Help at Discharge Family   Type of [place] to enter   Entrance Stairs-Number of Steps 2   Entrance Stairs-Rails None   Home Layout Two level;Able to live on main level with bedroom/bathroom  Office upstairs   Prior Function   Level of Independence Independent with basic ADLs;Independent with community mobility without device;Independent with household mobility without device  slowed ADLs, needs assist with compr. stockings   Vocation Retired  [place] No regular  physical activity   Posture/Postural Control   Posture/Postural Control Postural limitations   Postural Limitations Rounded Shoulders;Forward head   ROM / Strength   AROM / PROM / Strength Strength   Strength   Overall Strength Within functional limits for tasks performed   Transfers   Transfers Sit to Stand;Stand to Sit   Sit to Stand 5: Supervision;Without upper extremity assist;From chair/3-in-1   Five time sit to stand comments  12.92   Stand to Sit 5: Supervision;Without upper extremity assist;To chair/3-in-1   Comments Pt indicates he just does not sit on low or soft surfaces due to difficulty trying to get up.   Ambulation/Gait    Ambulation/Gait Yes   Ambulation/Gait Assistance 5: Supervision   Ambulation Distance (Feet) 250 Feet   Assistive device None   Gait Pattern Step-through pattern;Decreased arm swing - right;Decreased step length - right;Decreased dorsiflexion - right;Decreased trunk rotation;Wide base of support;Poor foot clearance - right   Ambulation Surface Level;Indoor   Gait velocity 12.28= 2.67 ft/sec   Gait Comments Festinating/freezing type gait noted during turns, with R foot having decreased foot clearance.     Standardized Balance Assessment   Standardized Balance Assessment Timed Up and Go Test;Four Square Step Test   Timed Up and Go Test   Normal TUG (seconds) 17.65   Manual TUG (seconds) 18.38   Cognitive TUG (seconds) 17.42   Four Square Step Test    Trial One  34.44   Trial Two 27.33   High Level Balance   High Level Balance Comments Posterior push and release test:  Significant posterior LOB-pt would fall if unaided by therapist   Functional Gait  Assessment   Gait assessed  Yes   Gait Level Surface Walks 20 ft, slow speed, abnormal gait pattern, evidence for imbalance or deviates 10-15 in outside of the 12 in walkway width. Requires more than 7 sec to ambulate 20 ft.  7.85   Change in Gait Speed Able to change speed, demonstrates mild gait deviations, deviates 6-10 in outside of the 12 in walkway width, or no gait deviations, unable to achieve a major change in velocity, or uses a change in velocity, or uses an assistive device.   Gait with Horizontal Head Turns Performs head turns smoothly with slight change in gait velocity (eg, minor disruption to smooth gait path), deviates 6-10 in outside 12 in walkway width, or uses an assistive device.   Gait with Vertical Head Turns Performs task with slight change in gait velocity (eg, minor disruption to smooth gait path), deviates 6 - 10 in outside 12 in walkway width or uses assistive device   Gait and Pivot Turn Pivot turns safely in  greater than 3 sec and stops with no loss of balance, or pivot turns safely within 3 sec and stops with mild imbalance, requires small steps to catch balance.  3.12 sec   Step Over Obstacle Is able to step over one shoe box (4.5 in total height) but must slow down and adjust steps to clear box safely. May require verbal cueing.   Gait with Narrow Base of Support Ambulates less than 4 steps heel to toe or cannot perform without assistance.   Gait with Eyes Closed Cannot walk 20 ft without assistance, severe gait deviations or imbalance, deviates greater than 15 in outside 12 in walkway width or will not attempt task.  veers to L   Ambulating Backwards Cannot walk 20 ft without assistance, severe gait deviations or imbalance, deviates greater than  15 in outside 12 in walkway width or will not attempt task.  19.42 sec for 15 ft-smaller steps/stops   Steps Two feet to a stair, must use rail.   Total Score 11   FGA comment: Scores <19/30 are indicative of increased fall risk.      Stiffness/rigidity noted with P/ROM of RLE > LLE.                       PT Short Term Goals - 08/31/15 2154    PT SHORT TERM GOAL #1   Title Pt will be independent with HEP for improved balance, transfers and gait.  TARGET 09/28/15   Time 4   Period Weeks   Status New   PT SHORT TERM GOAL #2   Title Pt will improve TUG score to less than or equal to 13.5 seconds for decreased fall risk.   Time 4   Period Weeks   Status New   PT SHORT TERM GOAL #3   Title Pt will perform at least 8 of 10 reps of sit<>stand transfers from 18 inch surfaces or below, without UE support, independently, for improved transfer efficiency and safety.   Time 4   Period Weeks   Status New   PT SHORT TERM GOAL #4   Title Pt will improve 4-square step test to less than or equal to 25 seconds for decreased fall risk.   Time 4   Period Weeks   Status New   PT SHORT TERM GOAL #5   Title Pt will verbalize understanding of  techniques to reduce freezing episodes with gait and turns.   Time 4   Period Weeks   Status New           PT Long Term Goals - 08/31/15 2157    PT LONG TERM GOAL #1   Title Pt will verbalize understanding of fall prevention within the home environment.  TARGET 10/31/15   Time 8   Period Weeks   Status New   PT LONG TERM GOAL #2   Title Pt will improve TUG manual to less than or equal to 15 seconds for decreased fall risk/improved dual tasking with gait.   Time 8   Period Weeks   Status New   PT LONG TERM GOAL #3   Title Pt will improve 4-square step test to less than or equal to 20 seconds for decreased fall risk.   Time 8   Period Weeks   Status New   PT LONG TERM GOAL #4   Title Pt will improve Functional Gait Assessment score to at least 16/30 for decreased fall risk.   Time 8   Period Weeks   Status New   PT LONG TERM GOAL #5   Title Pt will verbalize plans for optimal continued community fitness upon D/C from PT.   Time 8   Period Weeks   Status New               Plan - 08/31/15 2149    Clinical Impression Statement Pt is a 70 year old male who presents to OP PT with recent diagnosis of akinetic rigid Parkinson's disease.  He has PMH including skin cancer, toe amputations and lower extremity swelling.  He presents with decreased timing and coordination of gait, festinating/freezing episodes with turning, difficulty with changes of direction,  decreased balance, slowed transfers.  Pt has one fall in the past 6 months and pt is at fall risk per  TUG, 4-square step test scores.  Pt has significant difficulty with balance recovery in posterior direction, requiring therapist assistance.  Pt would benefit from skilled physical therapy to address the above stated deficits for improved funcitonal mobility and decreased fall risk.   Pt will benefit from skilled therapeutic intervention in order to improve on the following deficits Abnormal gait;Decreased balance;Decreased  mobility;Difficulty walking;Impaired flexibility;Postural dysfunction   Rehab Potential Good   PT Frequency 2x / week   PT Duration 8 weeks  plus eval   PT Treatment/Interventions ADLs/Self Care Home Management;Therapeutic exercise;Therapeutic activities;Functional mobility training;Gait training;Balance training;Neuromuscular re-education;Patient/family education   PT Next Visit Plan Provide information on medication management (Sinemet), initiate PWR! Moves in sitting/standing; transfer training, tips on reduction of freezing episodes with gait   Consulted and Agree with Plan of Care Patient          G-Codes - 05-Sep-2015 2200    Functional Assessment Tool Used TUG 17.65 sec, TUG manual 18.38 sec, TUG cog 17.42 sec, 4-square step:  34.44 sec, FGA 11/30, 5x sit<>stand 12.92 sec   Functional Limitation Mobility: Walking and moving around   Mobility: Walking and Moving Around Current Status [phone]) At least 40 percent but less than 60 percent impaired, limited or restricted   Mobility: Walking and Moving Around Goal Status [phone]) At least 20 percent but less than 40 percent impaired, limited or restricted       Problem List Patient Active Problem List   Diagnosis Date Noted  . 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 Dizziness 12/30/2014  . Diastolic CHF ([place]) 123456  . Former smoker 04/29/2014  . Chronic venous insufficiency 02/20/2014  . Hyperglycemia 08/02/2012  . Obesity 07/13/2010  . MALIGNANT MELANOMA SKIN LOWER LIMB INCLUDING HIP 03/09/2009  . Insomnia 10/03/2007  . Hyperlipidemia 12/13/2006  . Essential hypertension 12/13/2006    Doratha Mcswain W. 05-Sep-2015, 10:01 PM  [name]., PT  [facility] [address] [place], [place], [address] Phone: [phone]   Fax:  [phone]  Name: Nicholas [name] MRN: [id number] Date of Birth: 1946/06/02

## 2015-09-06 ENCOUNTER — Ambulatory Visit: Payer: PPO

## 2015-09-06 DIAGNOSIS — R269 Unspecified abnormalities of gait and mobility: Secondary | ICD-10-CM | POA: Diagnosis not present

## 2015-09-06 DIAGNOSIS — R471 Dysarthria and anarthria: Secondary | ICD-10-CM

## 2015-09-06 NOTE — Therapy (Signed)
[facility] [address] [place], [place], [address] Phone: [phone]   Fax:  [phone]  Speech Language Pathology Evaluation  Patient Details  Name: Nicholas [name] MRN: [id number] Date of Birth: 11-11-1945 Referring Provider: Narda Amber, M.D.  Encounter Date: 09/06/2015      End of Session - 09/06/15 1444    Visit Number 1   Number of Visits 17   Date for SLP Re-Evaluation 11/04/15   SLP Start Time 76   SLP Stop Time  1445   SLP Time Calculation (min) 43 min   Activity Tolerance Patient tolerated treatment well      Past Medical History  Diagnosis Date  . Hyperlipidemia   . Hypertension   . 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 Removal of melanoma of left foot/amputation of toes 1&25 Jul 2006  . 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 Wrist fracture surgery      70 years old-set    There were no vitals filed for this visit.  Visit Diagnosis: Dysarthria      Subjective Assessment - 09/06/15 1409    Subjective Pt reports difficulty with coughing during meals once a week. SLP told pt to use effortful swallow from now on.   Currently in Pain? No/denies            SLP Evaluation [facility] - 09/06/15 1409    SLP Visit Information   SLP Received On 09/06/15   Referring Provider Narda Amber, M.D.   Medical Diagnosis Parkinson's disease   General Information   HPI Diagnosed with akinetic rigid.in December 2016.   Cognition   Overall Cognitive Status Within Functional Limits for tasks assessed   Auditory Comprehension   Overall Auditory Comprehension Appears within functional limits for tasks assessed   Verbal Expression   Overall Verbal Expression Appears within functional limits for tasks assessed   Oral Motor/Sensory Function   Overall Oral Motor/Sensory Function Impaired   Labial ROM  Within Functional Limits   Labial Symmetry Within Functional Limits   Labial Coordination WFL   Lingual ROM Within Functional Limits   Lingual Symmetry Within Functional Limits   Lingual Strength Reduced Left   Lingual Coordination WFL   Facial ROM Within Functional Limits   Facial Symmetry Within Functional Limits   Velum Within Functional Limits   Motor Speech   Overall Motor Speech Impaired   Phonation Low vocal intensity   Effective Techniques Increased vocal intensity      Pt reports voice is softer in the AMs. Seven minutes of conversational speech was reduced today, at average 67dB (WNL= average 70-72dB) with range of 63-71dB, when a sound level meter was placed 30 cm away from pt's mouth. Overall intelligibility for this listener in a quiet environment was approx 100%. Production of loud /a/ averaged 92dB (range of 86 to 93) and min cues occasionally needed for loudness. Pt rated effort level at 9/10 for production of loud /a/ (10=maximal effort).   After evaluation measures, in a 5 minute conversation task, pt was asked to use increased effort, as with loud /a/, to improve loudness. Loudness average with this increased effort improved to 69dB (range of 65 to 71) with min A rarely for loudness. Pt would benefit from skilled ST in order to improve speech intelligibility and pt's QOL.  SLP Education - Sep 23, 2015 1444    Education provided Yes   Education Details loud /a/, therapy course   Person(s) Educated Patient   Methods Explanation;Demonstration;Verbal cues   Comprehension Verbalized understanding;Returned demonstration;Verbal cues required          SLP Short Term Goals - 09/23/15 1446    SLP SHORT TERM GOAL #1   Title pt will sustain loud /a/ at 94dB over 4 consecutive sessions   Time 4   Period Weeks   Status New   SLP SHORT TERM GOAL #2   Title pt will exhibit volume of 70dB in sentence responses 95%   Time 4   Period Weeks    Status New   SLP SHORT TERM GOAL #3   Title pt will report decr of frequency of people asking him to repeat    Time 4   Period Weeks   Status New          SLP Long Term Goals - 2015/09/23 1448    SLP LONG TERM GOAL #1   Title pt will sustain loud /a/ at average 94dB over 6 consecutive sessions with SBA   Time 8   Period Weeks   Status New   SLP LONG TERM GOAL #2   Title pt will generate speech in mod complex conversation with average 70dB over 10 minutes   Time 8   Period Weeks   Status New          Plan - September 23, 2015 1444    Clinical Impression Statement Pt presents with dysarthria characterized by reduced loudness in conversational speech, caused by Parkinsonism. Pt told SLP he has noticed people asking him to repeat more often in the last 6-12 months than prior. Pt would benefit from skilled ST to improve speech volume .   Speech Therapy Frequency 2x / week   Duration --  8 weeks   Treatment/Interventions SLP instruction and feedback;Compensatory strategies;Internal/external aids;Patient/family education;Functional tasks;Cueing hierarchy   Potential to Achieve Goals Good   Consulted and Agree with Plan of Care Patient          G-Codes - Sep 23, 2015 1449    Functional Assessment Tool Used noms - 6 (20%)   Functional Limitations Motor speech   Motor Speech Current Status [phone]) At least 20 percent but less than 40 percent impaired, limited or restricted   Motor Speech Goal Status [id number]) At least 1 percent but less than 20 percent impaired, limited or restricted      Problem List Patient Active Problem List   Diagnosis Date Noted  . 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 Dizziness 12/30/2014  . Diastolic CHF ([place]) 123456  . Former smoker 04/29/2014  . Chronic venous insufficiency 02/20/2014  . Hyperglycemia 08/02/2012  . Obesity 07/13/2010  . MALIGNANT MELANOMA SKIN LOWER LIMB INCLUDING HIP 03/09/2009  . Insomnia 10/03/2007  . Hyperlipidemia 12/13/2006   . Essential hypertension 12/13/2006    [facility] ,[place], [place] [address] [place], [place], [address] Phone: [phone]   Fax:  [phone]  Name: Nicholas [name] MRN: [id number] Date of Birth: 1946/06/08

## 2015-09-06 NOTE — Patient Instructions (Signed)
Complete 5-6 loud "ah" per day. Think of the loud "HEY!" to use your muscles in your abdomen.

## 2015-09-07 ENCOUNTER — Ambulatory Visit: Payer: PPO | Admitting: Physical Therapy

## 2015-09-07 ENCOUNTER — Ambulatory Visit: Payer: PPO

## 2015-09-07 DIAGNOSIS — R293 Abnormal posture: Secondary | ICD-10-CM

## 2015-09-07 DIAGNOSIS — R2689 Other abnormalities of gait and mobility: Secondary | ICD-10-CM

## 2015-09-07 DIAGNOSIS — R269 Unspecified abnormalities of gait and mobility: Secondary | ICD-10-CM

## 2015-09-07 DIAGNOSIS — R471 Dysarthria and anarthria: Secondary | ICD-10-CM

## 2015-09-07 NOTE — Patient Instructions (Signed)
  Please complete the assigned speech therapy homework and return it to your next session.  

## 2015-09-07 NOTE — Therapy (Signed)
[facility] [address] [place], [place], [address] Phone: [phone]   Fax:  [phone]  Speech Language Pathology Treatment  Patient Details  Name: Nicholas [name] MRN: [id number] Date of Birth: [birth year] Referring Provider: Narda Amber, M.D.  Encounter Date: 09/07/2015      End of Session - 09/07/15 1608    Visit Number 2   Number of Visits 17   Date for SLP Re-Evaluation 11/04/15   SLP Start Time 1533   SLP Stop Time  1601   SLP Time Calculation (min) 28 min      Past Medical History  Diagnosis Date  . Hyperlipidemia   . Hypertension   . 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 Removal of melanoma of left foot/amputation of toes 1&25 Jul 2006  . 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 Wrist fracture surgery      70 years old-set    There were no vitals filed for this visit.  Visit Diagnosis: Dysarthria      Subjective Assessment - 09/07/15 1541    Subjective "I'm giving you a break - I have to leave in 30 minutes."   Currently in Pain? No/denies           SLP Evaluation [facility] - 09/06/15 1409    SLP Visit Information   SLP Received On 09/06/15   Referring Provider Narda Amber, M.D.   Medical Diagnosis Parkinson's disease   General Information   HPI Diagnosed with akinetic rigid.in December 2016.   Cognition   Overall Cognitive Status Within Functional Limits for tasks assessed   Auditory Comprehension   Overall Auditory Comprehension Appears within functional limits for tasks assessed   Verbal Expression   Overall Verbal Expression Appears within functional limits for tasks assessed   Oral Motor/Sensory Function   Overall Oral Motor/Sensory Function Impaired   Labial ROM Within Functional Limits   Labial Symmetry Within Functional Limits   Labial Coordination WFL   Lingual ROM  Within Functional Limits   Lingual Symmetry Within Functional Limits   Lingual Strength Reduced Left   Lingual Coordination WFL   Facial ROM Within Functional Limits   Facial Symmetry Within Functional Limits   Velum Within Functional Limits   Motor Speech   Overall Motor Speech Impaired   Phonation Low vocal intensity   Effective Techniques Increased vocal intensity            ADULT SLP TREATMENT - 09/07/15 1543    General Information   Behavior/Cognition Alert;Cooperative;Pleasant mood   Treatment Provided   Treatment provided Cognitive-Linquistic   Cognitive-Linquistic Treatment   Treatment focused on Dysarthria   Skilled Treatment Loud /a/ production was used today to objectify pt's muscle memory for WNL speaking volume - average 91dB. In structured tasks today to incr pt's conversational volume to WNL, pt req'd initial min A occasionally in phrase responses, faded to rare min cues in multisentence tasks. In conversatino between those two tasks, pt spoke at average 68dB. However during multisentence task, SLP needed to use biofeedback (digital recording) to prove to pt he was not shouting. At the end of that task pt's conversational intensity was 70dB and pt remarked, "I'm trying to be louder."   Assessment / Recommendations / Plan   Plan Continue with current plan of care   Progression Toward Goals  Progression toward goals Progressing toward goals          SLP Education - 09/07/15 1607    Education provided Yes   Education Details Pt was not shouting during therapy today   Person(s) Educated Patient   Methods Explanation;Other (comment)  digital recording   Comprehension Verbalized understanding;Returned demonstration;Verbal cues required;Need further instruction          SLP Short Term Goals - 09/07/15 1613    SLP SHORT TERM GOAL #1   Title pt will sustain loud /a/ at 94dB over 4 consecutive sessions   Time 4   Period Weeks   Status On-going   SLP SHORT  TERM GOAL #2   Title pt will exhibit volume of 70dB in sentence responses 95%   Time 4   Period Weeks   Status On-going   SLP SHORT TERM GOAL #3   Title pt will report decr of frequency of people asking him to repeat    Time 4   Period Weeks   Status On-going          SLP Long Term Goals - 09/07/15 1613    SLP LONG TERM GOAL #1   Title pt will sustain loud /a/ at average 94dB over 6 consecutive sessions with SBA   Time 8   Period Weeks   Status On-going   SLP LONG TERM GOAL #2   Title pt will generate speech in mod complex conversation with average 70dB over 10 minutes   Time 8   Period Weeks   Status On-going          Plan - 09/07/15 1608    Clinical Impression Statement Pt with excellent success today - even carrying over into conversation after structured/semi-structured tasks. Pt would benefit from cont ST targeting WNL volume across speaking situations.   Speech Therapy Frequency 2x / week   Duration --  8 weeks   Treatment/Interventions SLP instruction and feedback;Compensatory strategies;Internal/external aids;Patient/family education;Functional tasks;Cueing hierarchy   Potential to Achieve Goals Good          G-Codes - 2015-09-27 1449    Functional Assessment Tool Used noms - 6 (20%)   Functional Limitations Motor speech   Motor Speech Current Status [id number]) At least 20 percent but less than 40 percent impaired, limited or restricted   Motor Speech Goal Status [id number]) At least 1 percent but less than 20 percent impaired, limited or restricted      Problem List Patient Active Problem List   Diagnosis Date Noted  . 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 Dizziness 12/30/2014  . Diastolic CHF ([place]) 123456  . Former smoker 04/29/2014  . Chronic venous insufficiency 02/20/2014  . Hyperglycemia 08/02/2012  . Obesity 07/13/2010  . MALIGNANT MELANOMA SKIN LOWER LIMB INCLUDING HIP 03/09/2009  . Insomnia 10/03/2007  . Hyperlipidemia 12/13/2006  .  Essential hypertension 12/13/2006    [facility] ,MS, [place]  09/07/2015, 4:14 PM  [facility] [address] [place], [place], [address] Phone: [phone]   Fax:  [phone]   Name: Nicholas [name] MRN: [id number] Date of Birth: 1946/02/14

## 2015-09-07 NOTE — Therapy (Signed)
[facility] [address] [place], [place], [address] Phone: [phone]   Fax:  [phone]  Physical Therapy Treatment  Patient Details  Name: Nicholas [name] MRN: [id number] Date of Birth: Feb 20, 1946 Referring Provider: Posey Pronto ([facility])  Encounter Date: 09/07/2015      PT End of Session - 09/07/15 1420    Visit Number 2   Number of Visits 17   Date for PT Re-Evaluation 10/31/15   Authorization Type Gcode every 10th visit   PT Start Time 1319   PT Stop Time 1400   PT Time Calculation (min) 41 min   Activity Tolerance Patient tolerated treatment well   Behavior During Therapy [facility] for tasks assessed/performed      Past Medical History  Diagnosis Date  . Hyperlipidemia   . Hypertension   . Cancer [facility]) Jan 2008    skin; hx of melanoma left foot/ amputation of toes 1&2     Past Surgical History  Procedure Laterality Date  . Removal of melanoma of left foot/amputation of toes 1&25 Jul 2006  . 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 Wrist fracture surgery      70 years old-set    There were no vitals filed for this visit.  Visit Diagnosis:  Abnormality of gait  Festinating gait  Postural instability      Subjective Assessment - 09/07/15 1322    Subjective Nothing new since eval last week.   Pertinent History 1007-skin cancer, with toes amptutated LLE; s/p chemotherapy LE edema/lymphedema LLE   Patient Stated Goals Pt's goal for therapy is to improve movement.   Currently in Pain? No/denies                         [facility] Adult PT Treatment/Exercise - 09/07/15 1326    Transfers   Transfers Sit to Stand;Stand to Sit   Sit to Stand 5: Supervision;Without upper extremity assist;From chair/3-in-1;From elevated surface   Stand to Sit 5: Supervision;Without upper extremity assist;To chair/3-in-1;To elevated surface   Number of Reps 10 reps;Other sets (comment)  from  22", then 18"   Comments Pt has increased posterior lean at times. with weight back on heels, needing cues for increased forward lean upon standing.   Ambulation/Gait   Ambulation/Gait Yes   Ambulation/Gait Assistance 5: Supervision   Ambulation Distance (Feet) 150 Feet   Assistive device None   Gait Pattern Step-through pattern;Decreased arm swing - right;Decreased step length - right;Decreased dorsiflexion - right;Decreased trunk rotation;Wide base of support;Poor foot clearance - right   Gait Comments Short distance gait with turning practice, with supervision and cues for widened BOS and slowed pace with turns for improved foot clearance.           PWR [facility]) - 09/07/15 1417    PWR! Up x 10  at counter   PWR! Rock x 10  at counter, then with reaching x 10   PWR! Twist x 10  with back towards counter, with decr. trunk rotation   PWR Step x 10 reps each-forward, side, back step and weightshift with counter UE support and verbal/tactile cues for proper weightshifting technique   Comments Pt has difficulty sequencing step and weightshift activities, especially in posterior direction.       Progressed PWR! Rock for weightshifting at counter to weightshifting and lifting for improved single limb stance and for turning practice.  Weigthshifting turns R and L, 2 reps  each, then practiced side-step quarter turns.  Pt has increased difficulty with quarter turns, seems to have improved foot clearance and no festinating noted with weightshifting turns.        PT Education - 09/07/15 1419    Education provided Yes   Education Details HEP-see instructions (sit<>stand x 10 reps, standing PWR! Up, standing PWR! Rock with added reach, then rock/lift/shift x 10 reps each) for turning prep; tips to reduce freezing with gait.   Person(s) Educated Patient   Methods Explanation;Demonstration;Handout   Comprehension Verbalized understanding;Returned demonstration;Need further instruction;Verbal  cues required          PT Short Term Goals - 08/31/15 2154    PT SHORT TERM GOAL #1   Title Pt will be independent with HEP for improved balance, transfers and gait.  TARGET 09/28/15   Time 4   Period Weeks   Status New   PT SHORT TERM GOAL #2   Title Pt will improve TUG score to less than or equal to 13.5 seconds for decreased fall risk.   Time 4   Period Weeks   Status New   PT SHORT TERM GOAL #3   Title Pt will perform at least 8 of 10 reps of sit<>stand transfers from 18 inch surfaces or below, without UE support, independently, for improved transfer efficiency and safety.   Time 4   Period Weeks   Status New   PT SHORT TERM GOAL #4   Title Pt will improve 4-square step test to less than or equal to 25 seconds for decreased fall risk.   Time 4   Period Weeks   Status New   PT SHORT TERM GOAL #5   Title Pt will verbalize understanding of techniques to reduce freezing episodes with gait and turns.   Time 4   Period Weeks   Status New           PT Long Term Goals - 08/31/15 2157    PT LONG TERM GOAL #1   Title Pt will verbalize understanding of fall prevention within the home environment.  TARGET 10/31/15   Time 8   Period Weeks   Status New   PT LONG TERM GOAL #2   Title Pt will improve TUG manual to less than or equal to 15 seconds for decreased fall risk/improved dual tasking with gait.   Time 8   Period Weeks   Status New   PT LONG TERM GOAL #3   Title Pt will improve 4-square step test to less than or equal to 20 seconds for decreased fall risk.   Time 8   Period Weeks   Status New   PT LONG TERM GOAL #4   Title Pt will improve Functional Gait Assessment score to at least 16/30 for decreased fall risk.   Time 8   Period Weeks   Status New   PT LONG TERM GOAL #5   Title Pt will verbalize plans for optimal continued community fitness upon D/C from PT.   Time 8   Period Weeks   Status New               Plan - 09/07/15 1420    Clinical  Impression Statement Initiated activities today to assist with transfers, weightshifting, and turning for improved functional mobility and decreased freezing with gait and transitional movements.  Pt needs cues and assistance for proper movement technique.  Pt will continue to benefit from furhter skilled PT to address functional moiblity, posture, gait  activities.   Pt will benefit from skilled therapeutic intervention in order to improve on the following deficits Abnormal gait;Decreased balance;Decreased mobility;Difficulty walking;Impaired flexibility;Postural dysfunction   Rehab Potential Good   PT Frequency 2x / week   PT Duration 8 weeks  plus eval   PT Treatment/Interventions ADLs/Self Care Home Management;Therapeutic exercise;Therapeutic activities;Functional mobility training;Gait training;Balance training;Neuromuscular re-education;Patient/family education   PT Next Visit Plan Review PWR! Moves in sitting/standing; transfer training, tips on reduction of freezing episodes with gait   Consulted and Agree with Plan of Care Patient        Problem List Patient Active Problem List   Diagnosis Date Noted  . 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 Dizziness 12/30/2014  . Diastolic CHF ([place]) 123456  . Former smoker 04/29/2014  . Chronic venous insufficiency 02/20/2014  . Hyperglycemia 08/02/2012  . Obesity 07/13/2010  . MALIGNANT MELANOMA SKIN LOWER LIMB INCLUDING HIP 03/09/2009  . Insomnia 10/03/2007  . Hyperlipidemia 12/13/2006  . Essential hypertension 12/13/2006    Letricia Krinsky W. 09/07/2015, 8:59 PM  [name]., PT  [facility] [address] [place], [place], [address] Phone: [phone]   Fax:  [phone]  Name: Nicholas [name] MRN: [id number] Date of Birth: 12-Aug-1945

## 2015-09-07 NOTE — Patient Instructions (Signed)
Sit to Stand Transfers:  1. Scoot out to the edge of the chair 2. Place your feet flat on the floor, shoulder width apart.  Make sure your feet are tucked just under your knees. 3. Lean forward (nose over toes) with momentum, and stand up tall with your best posture.  If you need to use your arms, use them as a quick boost up to stand. 4. If you are in a low or soft chair, you can lean back and then forward up to stand, in order to get more momentum. 5. Once you are standing, make sure you are looking ahead and standing tall.  To sit down:  1. Back up until you feel the chair behind your legs. 2. Bend at you hips, reaching  Back for you chair, if needed, then slowly squat to sit down on your chair.   Tips to reduce freezing episodes with standing or walking:  6. Stand tall with your feet wide, so that you can rock and weight shift through your hips. 7. Don't try to fight the freeze: if you begin taking slower, faster, smaller steps, STOP, get your posture tall, and RESET your posture and balance.  Take a deep breath before taking the BIG step to start again. 8. March in place, with high knee stepping, to get started walking again. 9. Use auditory cues:  Count out loud, think of a familiar tune or song or cadence, use pocket metronome, to use rhythm to get started walking again. 10. Use visual cues:  Use a line to step over, use laser pointer line to step over, (using BIG steps) to start walking again. 11. Use visual targets to keep your posture tall (look ahead and focus on an object or target at eye level). 12. As you approach where your destination with walking, count your steps out loud and/or focus on your target with your eyes until you are fully there. 13. Use appropriate assistive device, as advised by your physical therapist to assist with taking longer, consistent steps.    

## 2015-09-08 ENCOUNTER — Ambulatory Visit: Payer: PPO | Admitting: *Deleted

## 2015-09-08 ENCOUNTER — Encounter: Payer: Self-pay | Admitting: *Deleted

## 2015-09-08 DIAGNOSIS — R269 Unspecified abnormalities of gait and mobility: Secondary | ICD-10-CM | POA: Diagnosis not present

## 2015-09-08 DIAGNOSIS — R29898 Other symptoms and signs involving the musculoskeletal system: Secondary | ICD-10-CM

## 2015-09-08 DIAGNOSIS — R2689 Other abnormalities of gait and mobility: Secondary | ICD-10-CM

## 2015-09-08 NOTE — Therapy (Signed)
[facility] [address] [place], [place], [address] Phone: [phone]   Fax:  [phone]  Physical Therapy Treatment  Patient Details  Name: Nicholas [name] MRN: [id number] Date of Birth: 1945-11-01 Referring Provider: Posey Pronto ([facility])  Encounter Date: 09/08/2015      PT End of Session - 09/08/15 1431    Visit Number 3   Number of Visits 17   Date for PT Re-Evaluation 10/31/15   Authorization Type Gcode every 10th visit   PT Start Time 1315   PT Stop Time 1400   PT Time Calculation (min) 45 min   Equipment Utilized During Treatment Gait belt   Activity Tolerance Patient tolerated treatment well   Behavior During Therapy [facility] for tasks assessed/performed      Past Medical History  Diagnosis Date  . Hyperlipidemia   . Hypertension   . 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 Removal of melanoma of left foot/amputation of toes 1&25 Jul 2006  . 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 Wrist fracture surgery      70 years old-set    There were no vitals filed for this visit.  Visit Diagnosis:  Abnormality of gait  Festinating gait  Muscle rigidity      Subjective Assessment - 09/08/15 1407    Subjective Tends to lose balance with eyes closed in the shower.   Currently in Pain? No/denies                         [facility] Adult PT Treatment/Exercise - 09/07/15 1326    Transfers   Transfers Sit to Stand;Stand to Sit   Sit to Stand 5: Supervision;Without upper extremity assist;From chair/3-in-1;From elevated surface   Stand to Sit 5: Supervision;Without upper extremity assist;To chair/3-in-1;To elevated surface   Number of Reps 10 reps;Other sets (comment)  from 22", then 18"   Comments Pt had  No Posterior LOB with demonstrating strategies from handout.   Ambulation/Gait    Ambulation/Gait Yes   Ambulation/Gait Assistance 5: Supervision   Ambulation Distance (Feet) 150 Feet   Assistive device None   Gait Pattern Step-through pattern;Decreased arm swing - right;Decreased step length - right;Decreased dorsiflexion - right;Decreased trunk rotation;Wide base of support;Poor foot clearance - right   Gait Comments Short distance gait with turning practice, with supervision and cues for widened BOS and slowed pace with turns for improved foot clearance.           PWR [facility]) - 09/07/15 1417    PWR! Up x 10  at counter   PWR! Rock x 10  at counter, then with reaching x 10   PWR! Twist x 10  with back towards counter, with decr. trunk rotation   PWR Step x 10 reps each-forward, side, back step and weightshift with counter UE support and verbal/tactile cues for proper weightshifting technique   Comments Pt has difficulty sequencing step and weightshift activities, especially in posterior direction.            Balance Exercises - 09/08/15 1417    Balance Exercises: Standing   Standing, One Foot on a Step 4 inch;Eyes open  Alt foot taps, progressing with visual scanning, Supervision   Step Ups Forward  ramp and curb negotiation, working on coordinating movement  Retro Gait 4 reps  working in coordinating reciprocal pattern   Other Standing Exercises --  Multilevel reaching in staggers stance, without UE Support   OTAGO PROGRAM   Walking and Turning Around No assistive device  working on turning Rt. with rock and shift technique   Stair Walking 4 steps 2 to no rails working in coordinating reciprocal  pattern, with min cues and A to [employer]           PT Education - 09/08/15 1408    Education provided Yes   Education Details Reviewed sit<>stand and freezing tips and PWR! Up, Rock, Twist and Step in standing 10x ea with intermittant UE support; PWR! Rock and Progression: Step (Turn)   Person(s) Educated Patient   Methods  Explanation;Demonstration;Verbal cues;Handout   Comprehension Verbalized understanding;Returned demonstration;Verbal cues required          PT Short Term Goals - 09/08/15 1431    PT SHORT TERM GOAL #1   Title Pt will be independent with HEP for improved balance, transfers and gait.  TARGET 09/28/15   Time 4   Period Weeks   Status New   PT SHORT TERM GOAL #2   Title Pt will improve TUG score to less than or equal to 13.5 seconds for decreased fall risk.   Time 4   Period Weeks   Status New   PT SHORT TERM GOAL #3   Title Pt will perform at least 8 of 10 reps of sit<>stand transfers from 18 inch surfaces or below, without UE support, independently, for improved transfer efficiency and safety.   Time 4   Period Weeks   Status New   PT SHORT TERM GOAL #4   Title Pt will improve 4-square step test to less than or equal to 25 seconds for decreased fall risk.   Time 4   Period Weeks   Status New   PT SHORT TERM GOAL #5   Title Pt will verbalize understanding of techniques to reduce freezing episodes with gait and turns.   Time 4   Period Weeks   Status New           PT Long Term Goals - 09/08/15 1432    PT LONG TERM GOAL #1   Title Pt will verbalize understanding of fall prevention within the home environment.  TARGET 10/31/15   Time 8   Period Weeks   Status New   PT LONG TERM GOAL #2   Title Pt will improve TUG manual to less than or equal to 15 seconds for decreased fall risk/improved dual tasking with gait.   Time 8   Period Weeks   Status New   PT LONG TERM GOAL #3   Title Pt will improve 4-square step test to less than or equal to 20 seconds for decreased fall risk.   Time 8   Period Weeks   Status New   PT LONG TERM GOAL #4   Title Pt will improve Functional Gait Assessment score to at least 16/30 for decreased fall risk.   Time 8   Period Weeks   Status New   PT LONG TERM GOAL #5   Title Pt will verbalize plans for optimal continued community fitness upon  D/C from PT.   Time 8   Period Weeks   Status New               Plan - 09/08/15 1432    Clinical Impression Statement Focus for skilled  session  was on HEP review, and coordination with functional gait and standing balance activities.  Pt is progressing well demonstrating understanding of therapy edu., and utliizing strategies to overcome freezing effectively.   Pt will benefit from skilled therapeutic intervention in order to improve on the following deficits Abnormal gait;Decreased balance;Decreased mobility;Difficulty walking;Impaired flexibility;Postural dysfunction   Rehab Potential Good   PT Frequency 2x / week   PT Duration 8 weeks  plus eval   PT Treatment/Interventions ADLs/Self Care Home Management;Therapeutic exercise;Therapeutic activities;Functional mobility training;Gait training;Balance training;Neuromuscular re-education;Patient/family education   PT Next Visit Plan Review PWR! Moves in sitting/standing; coordinating  transitional movements for functional gait and standing balance including eyes closed.   Consulted and Agree with Plan of Care Patient        Problem List Patient Active Problem List   Diagnosis Date Noted  . 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 Dizziness 12/30/2014  . Diastolic CHF ([place]) 123456  . Former smoker 04/29/2014  . Chronic venous insufficiency 02/20/2014  . Hyperglycemia 08/02/2012  . Obesity 07/13/2010  . MALIGNANT MELANOMA SKIN LOWER LIMB INCLUDING HIP 03/09/2009  . Insomnia 10/03/2007  . Hyperlipidemia 12/13/2006  . Essential hypertension 12/13/2006    Bjorn Loser, PTA  09/08/2015, 2:40 PM [facility] [address] [place], [place], [address] Phone: [phone]   Fax:  [phone]  Name: Nicholas [name] MRN: [id number] Date of Birth: 1946-03-12

## 2015-09-09 ENCOUNTER — Ambulatory Visit: Payer: PPO | Admitting: Occupational Therapy

## 2015-09-09 VITALS — BP 135/95

## 2015-09-09 DIAGNOSIS — R293 Abnormal posture: Secondary | ICD-10-CM

## 2015-09-09 DIAGNOSIS — R4189 Other symptoms and signs involving cognitive functions and awareness: Secondary | ICD-10-CM

## 2015-09-09 DIAGNOSIS — R269 Unspecified abnormalities of gait and mobility: Secondary | ICD-10-CM | POA: Diagnosis not present

## 2015-09-09 DIAGNOSIS — R278 Other lack of coordination: Secondary | ICD-10-CM

## 2015-09-09 DIAGNOSIS — R2681 Unsteadiness on feet: Secondary | ICD-10-CM

## 2015-09-09 DIAGNOSIS — R279 Unspecified lack of coordination: Principal | ICD-10-CM

## 2015-09-09 DIAGNOSIS — R258 Other abnormal involuntary movements: Secondary | ICD-10-CM

## 2015-09-09 DIAGNOSIS — R29898 Other symptoms and signs involving the musculoskeletal system: Secondary | ICD-10-CM

## 2015-09-09 NOTE — Therapy (Addendum)
[facility] [address] [place], [place], [address] Phone: [phone]   Fax:  [phone]  Occupational Therapy Evaluation  Patient Details  Name: Nicholas [name] MRN: [id number] Date of Birth: [birth year] Referring Provider: Dr. Patel/ Dr. Nicki Reaper  Encounter Date: 09/09/2015      OT End of Session - 09/09/15 1518    Visit Number 1   Number of Visits 17   Date for OT Re-Evaluation 11/06/15   Authorization Type [employer]   Authorization - Visit Number 1   Authorization - Number of Visits 10   OT Start Time 1109   OT Stop Time 1150   OT Time Calculation (min) 41 min   Activity Tolerance Patient tolerated treatment well   Behavior During Therapy [facility] for tasks assessed/performed      Past Medical History  Diagnosis Date  . Hyperlipidemia   . Hypertension   . 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 Removal of melanoma of left foot/amputation of toes 1&25 Jul 2006  . 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 Wrist fracture surgery      70 years old-set    There were no vitals filed for this visit.  Visit Diagnosis:  Decreased coordination  Muscle rigidity  Postural instability  Unsteadiness  Cognitive deficits  Bradykinesia      Subjective Assessment - 09/09/15 1225    Subjective  Pt diagnosed with PD in December By Dr Jennelle Human at [place], see Epic   Patient Stated Goals to write better   Currently in Pain? No/denies   Multiple Pain Sites No           [facility] OT Assessment - 09/09/15 0001    Assessment   Diagnosis Parkinson's disease   Referring Provider Dr. Patel/ Dr. Nicki Reaper   Onset Date 06/23/16   Assessment Pt was diagnosed with PD at [facility] by [name] in December. Pt presents with bradykinesia, mild coordination deficits, cognitive deficits, and  rigidity.    Prior Therapy PT   Precautions   Precautions Fall   Balance Screen   Has the patient fallen in the past 6 months Yes   How many times? 1   Has the patient had a decrease in activity level because of a fear of falling?  No   Is the patient reluctant to leave their home because of a fear of falling?  No   Home  Environment   Family/patient expects to be discharged to: Private residence   Lives With Spouse   Prior Function   Level of Independence Independent   Vocation Retired  Engineer, maintenance (IT), does some part time work   Leisure No regular physical activity   ADL   Eating/Feeding Modified independent   Grooming Independent   Upper Body Bathing Modified independent   Lower Body Bathing Modified independent   Upper Body Dressing Increased time   Lower Body Dressing Increased time   Engineer, mining Independent   ADL comments Pt requires increased time with all ADLS, pt reports it takes him an hour to bathe and dress   Mobility   Mobility Status Independent   Written Expression   Dominant Hand Right   Handwriting 90% legible;Mild micrographia   Cognition  Overall Cognitive Status Cognition to be further assessed in functional context PRN   Memory Impaired   Memory Impairment Decreased short term memory   Observation/Other Assessments   Standing Functional Reach Test RUE 9.5 in, LUE 11 inches   Other Surveys  Select   Physical Performance Test   Yes   Simulated Eating Time (seconds) 13.41 secs   Donning Doffing Jacket Time (seconds) 15.75 secs   Donning Doffing Jacket Comments 3 button/ unbutton:37.65 secs    Coordination   Fine Motor Movements are Fluid and Coordinated No   9 Hole Peg Test Right;Left   Right 9 Hole Peg Test 31.53 secs   Left 9 Hole Peg Test 29.59 secs   Tremors mild bilateral   Tone   Assessment Location Right Upper Extremity;Left Upper Extremity   RUE Tone   RUE Tone Mild   rigidity   LUE Tone   LUE Tone Mild  rigidity     Pt reports he stopped taking blood pressure medicine due to feelings of lightheadedness/ dizziness, therapist monitored BP(135/95) and discussed importance of BP medications for minimizing risk for CVA. Therapist recommended pt contact his MD to discuss today.                      OT Short Term Goals - 09/09/15 1312    OT SHORT TERM GOAL #1   Title I with PD specific HEP   Time 4   Period Weeks   Status New   OT SHORT TERM GOAL #2   Title Pt will verbalize understanding of adapted strategies for ADLs/IADLS.   Time 4   Period Weeks   Status New   OT SHORT TERM GOAL #3   Title Pt will demonstrate ability to write a short paragraph with 100% legibility and no significant decrease in letter size.   Time 4   Period Weeks   Status New   OT SHORT TERM GOAL #4   Title Pt will perform 3 button/ unbutton task in 32 secs or less   Time 4   Period Weeks   Status New           OT Long Term Goals - 09/09/15 1513    OT LONG TERM GOAL #1   Title Pt will verbalize understanding of ways to prevent future PD related complicationsand verbalize understanding of community resources.   Time 8   Period Weeks   Status New   OT LONG TERM GOAL #2   Title Pt will decrease PPT#2(simulated feeding) to 10 secs or less   Time 8   Period Weeks   Status New   OT LONG TERM GOAL #3   Title Pt will verbalize understanding of ways to keep thinking skills sharp and compensations for short term memory.   Time 8   Period Weeks   Status New               Plan - 09/09/15 1307    Clinical Impression Statement Pt diagnosed with Parkinson's disease in December 2016 with history of melanoma , presents with bradykinesia, mild coordination deficits, rigidity, decreased short term memory and decreased balance. Pt can benefit from skiled occupational therapy to maximize independence with ADLS/ IADLs.   Pt will benefit from skilled  therapeutic intervention in order to improve on the following deficits (Retired) Abnormal gait;Decreased coordination;Decreased range of motion;Difficulty walking;Decreased safety awareness;Decreased activity tolerance;Impaired tone;Pain;Impaired UE functional use;Decreased balance;Decreased knowledge of use of DME;Decreased cognition;Decreased mobility;Decreased strength  Rehab Potential Good   OT Frequency 2x / week  plus eval   OT Duration 8 weeks   OT Treatment/Interventions Self-care/ADL training;Therapeutic exercise;Patient/family education;Balance training;Therapeutic exercises;Manual Therapy;Neuromuscular education;Energy conservation;Parrafin;DME and/or AE instruction;Therapeutic activities;Fluidtherapy;Passive range of motion;Moist Heat   Plan initiate PWR!   Consulted and Agree with Plan of Care Patient        Problem List Patient Active Problem List   Diagnosis Date Noted  . 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 Dizziness 12/30/2014  . Diastolic CHF ([place]) 123456  . Former smoker 04/29/2014  . Chronic venous insufficiency 02/20/2014  . Hyperglycemia 08/02/2012  . Obesity 07/13/2010  . MALIGNANT MELANOMA SKIN LOWER LIMB INCLUDING HIP 03/09/2009  . Insomnia 10/03/2007  . Hyperlipidemia 12/13/2006  . Essential hypertension 12/13/2006    RINE,KATHRYN 09/09/2015, 3:21 PM Theone Murdoch, OTR/L [phone] Phone: [phone] 3:21 PM 09/09/2015 [facility] [address] [place], [place], [address] Phone: [phone]   Fax:  [phone]  Name: Nicholas [name] MRN: [id number] Date of Birth: [birth year]

## 2015-09-14 ENCOUNTER — Encounter: Payer: PPO | Admitting: Occupational Therapy

## 2015-09-14 DIAGNOSIS — G20 Parkinson's disease: Secondary | ICD-10-CM | POA: Diagnosis not present

## 2015-09-15 ENCOUNTER — Ambulatory Visit: Payer: PPO | Admitting: Physical Therapy

## 2015-09-15 ENCOUNTER — Ambulatory Visit: Payer: PPO

## 2015-09-15 ENCOUNTER — Ambulatory Visit: Payer: PPO | Admitting: Occupational Therapy

## 2015-09-15 DIAGNOSIS — R2681 Unsteadiness on feet: Secondary | ICD-10-CM

## 2015-09-15 DIAGNOSIS — R293 Abnormal posture: Secondary | ICD-10-CM

## 2015-09-15 DIAGNOSIS — R279 Unspecified lack of coordination: Secondary | ICD-10-CM

## 2015-09-15 DIAGNOSIS — R4189 Other symptoms and signs involving cognitive functions and awareness: Secondary | ICD-10-CM

## 2015-09-15 DIAGNOSIS — R471 Dysarthria and anarthria: Secondary | ICD-10-CM

## 2015-09-15 DIAGNOSIS — R278 Other lack of coordination: Secondary | ICD-10-CM

## 2015-09-15 DIAGNOSIS — R258 Other abnormal involuntary movements: Secondary | ICD-10-CM

## 2015-09-15 DIAGNOSIS — R29898 Other symptoms and signs involving the musculoskeletal system: Secondary | ICD-10-CM

## 2015-09-15 DIAGNOSIS — R269 Unspecified abnormalities of gait and mobility: Secondary | ICD-10-CM | POA: Diagnosis not present

## 2015-09-15 NOTE — Therapy (Signed)
[facility] [address] [place], [place], [address] Phone: [phone]   Fax:  [phone]  Occupational Therapy Treatment  Patient Details  Name: Nicholas [name] MRN: [id number] Date of Birth: 12/14/1945 Referring Provider: Dr. Patel/ Dr. Nicki Reaper  Encounter Date: 09/15/2015      OT End of Session - 09/15/15 1501    Visit Number 2   Number of Visits 17   Date for OT Re-Evaluation 11/06/15   Authorization Type [employer]   Authorization - Visit Number 2   Authorization - Number of Visits 10   OT Start Time [id number]   OT Stop Time 1445   OT Time Calculation (min) 40 min   Activity Tolerance Patient tolerated treatment well   Behavior During Therapy [facility] for tasks assessed/performed      Past Medical History  Diagnosis Date  . Hyperlipidemia   . Hypertension   . Cancer [facility]) Jan 2008    skin; hx of melanoma left foot/ amputation of toes 1&2     Past Surgical History  Procedure Laterality Date  . Removal of melanoma of left foot/amputation of toes 1&25 Jul 2006  . 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 Wrist fracture surgery      70 years old-set    There were no vitals filed for this visit.  Visit Diagnosis:  Muscle rigidity  Postural instability  Decreased coordination  Cognitive deficits  Bradykinesia      Subjective Assessment - 09/15/15 1457    Pertinent History melanoma, see Epic   Patient Stated Goals to write better   Currently in Pain? No/denies   Multiple Pain Sites No            Handwriting exercises and strategies. Handout issued for handwriting strategies. Pt verbalized understanding. Flipping and dealing cards with thumb using larger movements, finger extension, mod v.c.              PWR ([facility]) - 09/15/15    PWR! exercises Moves in modified quadraped(Attempted in quadraped, pt unable to tolerate.) min v..c initally   PWR! Up x 10   PWR! Rock x 10   PWR!  Twist x 10   PWR! Step x 10               OT Short Term Goals - 09/09/15 1312    OT SHORT TERM GOAL #1   Title I with PD specific HEP   Time 4   Period Weeks   Status New   OT SHORT TERM GOAL #2   Title Pt will verbalize understanding of adapted strategies for ADLs/IADLS.   Time 4   Period Weeks   Status New   OT SHORT TERM GOAL #3   Title Pt will demonstrate ability to write a short paragraph with 100% legibility and no significant decrease in letter size.   Time 4   Period Weeks   Status New   OT SHORT TERM GOAL #4   Title Pt will perform 3 button/ unbutton task in 32 secs or less   Baseline 37.65 secs   Time 4   Period Weeks   Status New           OT Long Term Goals - 09/09/15 1513    OT LONG TERM GOAL #1   Title Pt will verbalize understanding of ways to prevent future PD related complications and verbalize understanding of community resources.   Time 8   Period Weeks  Status New   OT LONG TERM GOAL #2   Title Pt will decrease PPT#2(simulated feeding) to 10 secs or less   Baseline 13.41 secs   Time 8   Period Weeks   Status New   OT LONG TERM GOAL #3   Title Pt will verbalize understanding of ways to keep thinking skills sharp and compensations for short term memory.   Time 8   Period Weeks   Status New   OT LONG TERM GOAL #4   Title Pt will improve RUE standing functional reach to 10 inches or greater.   Baseline baseline: 9.5 inches, LUE 11 inches   Time 8   Period Weeks   Status New               Plan - 09/15/15 1458    Clinical Impression Statement Pt is progressing towards goals. He demonstratates understandin of PWR! moves in modified quadraped   Pt will benefit from skilled therapeutic intervention in order to improve on the following deficits (Retired) Abnormal gait;Decreased coordination;Decreased range of motion;Difficulty walking;Decreased safety awareness;Decreased activity tolerance;Impaired tone;Pain;Impaired UE functional  use;Decreased balance;Decreased knowledge of use of DME;Decreased cognition;Decreased mobility;Decreased strength   Rehab Potential Good   OT Frequency 2x / week   OT Duration 8 weeks   OT Treatment/Interventions Self-care/ADL training;Therapeutic exercise;Patient/family education;Balance training;Therapeutic exercises;Manual Therapy;Neuromuscular education;Energy conservation;Parrafin;DME and/or AE instruction;Therapeutic activities;Fluidtherapy;Passive range of motion;Moist Heat   Plan add to PWR! HEP, coordination HEP   OT Home Exercise Plan issued: PWR! modified quadraped, handwriting strategies(PT issued PWR! seated)   Consulted and Agree with Plan of Care Patient        Problem List Patient Active Problem List   Diagnosis Date Noted  . 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 Dizziness 12/30/2014  . Diastolic CHF ([place]) 123456  . Former smoker 04/29/2014  . Chronic venous insufficiency 02/20/2014  . Hyperglycemia 08/02/2012  . Obesity 07/13/2010  . MALIGNANT MELANOMA SKIN LOWER LIMB INCLUDING HIP 03/09/2009  . Insomnia 10/03/2007  . Hyperlipidemia 12/13/2006  . Essential hypertension 12/13/2006    [place] 09/15/2015, 3:02 PM Theone Murdoch, OTR/L [phone] Phone: [phone] 3:02 PM 02/22/2017[facility] [address] [place], [place], [address] Phone: [phone]   Fax:  [phone]  Name: Nicholas [name] MRN: [id number] Date of Birth: 12-23-1945

## 2015-09-15 NOTE — Therapy (Signed)
[facility] [address] [place], [place], [address] Phone: [phone]   Fax:  [phone]  Speech Language Pathology Treatment  Patient Details  Name: Nicholas [name] MRN: [id number] Date of Birth: [birth year] Referring Provider: Narda Amber, M.D.  Encounter Date: 09/15/2015      End of Session - 09/15/15 1625    Visit Number 3   Number of Visits 17   Date for SLP Re-Evaluation 11/04/15   SLP Start Time [id number]   SLP Stop Time  1615   SLP Time Calculation (min) 42 min   Activity Tolerance Patient tolerated treatment well      Past Medical History  Diagnosis Date  . Hyperlipidemia   . Hypertension   . Cancer [facility]) Jan 2008    skin; hx of melanoma left foot/ amputation of toes 1&2     Past Surgical History  Procedure Laterality Date  . Removal of melanoma of left foot/amputation of toes 1&25 Jul 2006  . 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 Wrist fracture surgery      70 years old-set    There were no vitals filed for this visit.  Visit Diagnosis: Dysarthria      Subjective Assessment - 09/15/15 1537    Subjective "They worked me out a little harder."   Currently in Pain? No/denies               ADULT SLP TREATMENT - 09/15/15 1538    General Information   Behavior/Cognition Alert;Cooperative;Pleasant mood   Treatment Provided   Treatment provided Cognitive-Linquistic   Cognitive-Linquistic Treatment   Treatment focused on Dysarthria   Skilled Treatment Pt using loud /a/ to recalibrate speaking volume to by and averaged 93dB. In structured multisentence tasks, pt maintained 71dB average. In mod complex multisentence tasks, pt maintained 71dB with rare min A.    Assessment / Recommendations / Plan   Plan Continue with current plan of care   Progression Toward Goals   Progression toward goals Progressing toward goals            SLP Short Term Goals - 09/15/15 1626    SLP SHORT TERM  GOAL #1   Title pt will sustain loud /a/ at 94dB over 4 consecutive sessions   Time 3   Period Weeks   Status On-going   SLP SHORT TERM GOAL #2   Title pt will exhibit volume of 70dB in sentence responses 95%   Status Achieved   SLP SHORT TERM GOAL #3   Title pt will report decr of frequency of people asking him to repeat    Time 3   Period Weeks   Status On-going          SLP Long Term Goals - 09/15/15 1627    SLP LONG TERM GOAL #1   Title pt will sustain loud /a/ at average 94dB over 6 consecutive sessions with SBA   Time 7   Period Weeks   Status On-going   SLP LONG TERM GOAL #2   Title pt will generate speech in mod complex conversation with average 70dB over 10 minutes   Time 7   Period Weeks   Status On-going          Plan - 09/15/15 1626    Clinical Impression Statement Pt with good to excellent success today - even carrying over into conversation outside walking around building. Pt would benefit from cont ST targeting WNL volume across speaking situations.  Suspect d/c in 1-2 more weeks (2-4 more visits).   Speech Therapy Frequency 2x / week   Duration --  7 weeks   Treatment/Interventions SLP instruction and feedback;Compensatory strategies;Internal/external aids;Patient/family education;Functional tasks;Cueing hierarchy   Potential to Achieve Goals Good        Problem List Patient Active Problem List   Diagnosis Date Noted  . 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 Dizziness 12/30/2014  . Diastolic CHF ([place]) 123456  . Former smoker 04/29/2014  . Chronic venous insufficiency 02/20/2014  . Hyperglycemia 08/02/2012  . Obesity 07/13/2010  . MALIGNANT MELANOMA SKIN LOWER LIMB INCLUDING HIP 03/09/2009  . Insomnia 10/03/2007  . Hyperlipidemia 12/13/2006  . Essential hypertension 12/13/2006    [facility] ,[place], [place]  09/15/2015, 4:28 PM  [facility] [address] [place], [place], [address] Phone: [phone]   Fax:  [phone]   Name: Nicholas [name] MRN: [id number] Date of Birth: 10-13-1945

## 2015-09-15 NOTE — Therapy (Signed)
[facility] [address] [place], [place], [address] Phone: [phone]   Fax:  [phone]  Physical Therapy Treatment  Patient Details  Name: Nicholas [name] MRN: [id number] Date of Birth: April 17, 1946 Referring Provider: Posey Pronto ([facility])  Encounter Date: 09/15/2015    Past Medical History  Diagnosis Date  . Hyperlipidemia   . Hypertension   . 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 Removal of melanoma of left foot/amputation of toes 1&25 Jul 2006  . 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 Wrist fracture surgery      70 years old-set    There were no vitals filed for this visit.  Visit Diagnosis:  Postural instability  Unsteadiness  Abnormality of gait      Subjective Assessment - 09/15/15 1320    Subjective Been working at a desk all morning long.  So, I'm stiff today.  Went back to [employer] yesterday and he upped the Sinemet by 1/2 tablet.   Patient Stated Goals Pt's goal for therapy is to improve movement.   Currently in Pain? No/denies                         [facility] Adult PT Treatment/Exercise - 09/15/15 0001    High Level Balance   High Level Balance Activities Side stepping;Direction changes;Sudden stops;Turns  Sidestepping at counter-with/without UE support   High Level Balance Comments With sidestepping, cues for larger amplitude stepping.  With gait activities, starts/stops, changes of directions, with unpredictable side/forward/back stepping, then turning, progressing to narrow spaces, no episodes of freezing with gait activities.           PWR [facility]) - 09/15/15 1332    PWR! exercises Moves in sitting;Moves in standing   PWR! Up x 10   PWR! Rock x 10   PWR! Twist x 10   PWR Step  x 10 side, x 5 reps diagonal   Comments At chair, review of standing PWR!  Moves, pt able to return demonstrate with min verbal and visual cues, with pt noting fatigue after 8-10 reps   PWR! Up x 10   PWR! Rock x 10   PWR! Twist x 10   PWR! Step x 10   Comments review of PWR! Moves in sitting-pt able to return demo with min visual and verbal cues             PT Education - 09/15/15 2302    Education provided Yes   Education Details Reiterated importance of therapy exercises as well as awareness of implementing movement strategies into daily activities   Person(s) Educated Patient   Methods Explanation;Demonstration   Comprehension Verbalized understanding          PT Short Term Goals - 09/08/15 1431    PT SHORT TERM GOAL #1   Title Pt will be independent with HEP for improved balance, transfers and gait.  TARGET 09/28/15   Time 4   Period Weeks   Status New   PT SHORT TERM GOAL #2   Title Pt will improve TUG score to less than or equal to 13.5 seconds for decreased fall risk.   Time 4   Period Weeks   Status New   PT SHORT TERM GOAL #3   Title Pt will perform at least 8  of 10 reps of sit<>stand transfers from 18 inch surfaces or below, without UE support, independently, for improved transfer efficiency and safety.   Time 4   Period Weeks   Status New   PT SHORT TERM GOAL #4   Title Pt will improve 4-square step test to less than or equal to 25 seconds for decreased fall risk.   Time 4   Period Weeks   Status New   PT SHORT TERM GOAL #5   Title Pt will verbalize understanding of techniques to reduce freezing episodes with gait and turns.   Time 4   Period Weeks   Status New           PT Long Term Goals - 09/08/15 1432    PT LONG TERM GOAL #1   Title Pt will verbalize understanding of fall prevention within the home environment.  TARGET 10/31/15   Time 8   Period Weeks   Status New   PT LONG TERM GOAL #2   Title Pt will improve TUG manual to less than or equal to 15 seconds for decreased fall risk/improved dual tasking with gait.    Time 8   Period Weeks   Status New   PT LONG TERM GOAL #3   Title Pt will improve 4-square step test to less than or equal to 20 seconds for decreased fall risk.   Time 8   Period Weeks   Status New   PT LONG TERM GOAL #4   Title Pt will improve Functional Gait Assessment score to at least 16/30 for decreased fall risk.   Time 8   Period Weeks   Status New   PT LONG TERM GOAL #5   Title Pt will verbalize plans for optimal continued community fitness upon D/C from PT.   Time 8   Period Weeks   Status New               Plan - 09/15/15 2303    Clinical Impression Statement Pt overall seems to be moving in more smooth, coordinated manner.  Responds well to practice in varied directions, simulating pt's social situations (parties/get-togethers in crowds) where he experiences freezing episodes.  Pt will continue to benefit from further skilled PT to addresss balance and gait.   Pt will benefit from skilled therapeutic intervention in order to improve on the following deficits Abnormal gait;Decreased balance;Decreased mobility;Difficulty walking;Impaired flexibility;Postural dysfunction   Rehab Potential Good   PT Frequency 2x / week   PT Duration 8 weeks   PT Treatment/Interventions ADLs/Self Care Home Management;Therapeutic exercise;Therapeutic activities;Functional mobility training;Gait training;Balance training;Neuromuscular re-education;Patient/family education   PT Next Visit Plan Continue to practice transitional movements, varied direction stepping, and progressive difficulty of gait tasks (dual tasking)        Problem List Patient Active Problem List   Diagnosis Date Noted  . 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 Dizziness 12/30/2014  . Diastolic CHF ([place]) 123456  . Former smoker 04/29/2014  . Chronic venous insufficiency 02/20/2014  . Hyperglycemia 08/02/2012  . Obesity 07/13/2010  . MALIGNANT MELANOMA SKIN LOWER LIMB INCLUDING HIP 03/09/2009  .  Insomnia 10/03/2007  . Hyperlipidemia 12/13/2006  . Essential hypertension 12/13/2006    [address] 09/15/2015, 11:08 PM [name]., PT  [facility] [address] [place], [place], [address] Phone: [phone]   Fax:  [phone]  Name: Nicholas [name] MRN: [id number] Date of Birth: 1946/06/14

## 2015-09-16 ENCOUNTER — Ambulatory Visit: Payer: PPO | Admitting: Physical Therapy

## 2015-09-16 ENCOUNTER — Ambulatory Visit: Payer: PPO | Admitting: Occupational Therapy

## 2015-09-16 DIAGNOSIS — R258 Other abnormal involuntary movements: Secondary | ICD-10-CM

## 2015-09-16 DIAGNOSIS — R29898 Other symptoms and signs involving the musculoskeletal system: Secondary | ICD-10-CM

## 2015-09-16 DIAGNOSIS — R279 Unspecified lack of coordination: Principal | ICD-10-CM

## 2015-09-16 DIAGNOSIS — R269 Unspecified abnormalities of gait and mobility: Secondary | ICD-10-CM | POA: Diagnosis not present

## 2015-09-16 DIAGNOSIS — R278 Other lack of coordination: Secondary | ICD-10-CM

## 2015-09-16 DIAGNOSIS — R293 Abnormal posture: Secondary | ICD-10-CM

## 2015-09-16 NOTE — Patient Instructions (Addendum)
Coordination Exercises  Perform the following exercises for 20 minutes 1 times per day. Perform with both hand(s). Perform using big movements.   Flipping Cards: Place deck of cards on the table. Flip cards over by opening your hand big to grasp and then turn your palm up big.  Deal cards: Hold 1/2 or whole deck in your hand. Use thumb to push card off top of deck with one big push.  Rotate ball with fingertips: Pick up with fingers/thumb and move as much as you can with each turn/movement (clockwise and counter-clockwise).  Toss ball from one hand to the other: Toss big/high.  Toss ball in the air and catch with the same hand: Toss big/high.  Rotate 2 golf balls in your hand: Both directions.  Pick up coins and place in coin bank or container: Pick up with big, intentional movements. Do not drag coin to the edge.  Pick up coins and stack one at a time: Pick up with big, intentional movements. Do not drag coin to the edge. (5-10 in a stack)  Pick up 5-10 coins one at a time and hold in palm. Then, move coins from palm to fingertips one at time and place in coin bank/container.  Pick up 5-10 coins one at a time and hold in palm. Then, move coins from palm to fingertips one at a time to stack.  Practice writing: Slow down, write big, and focus on forming each letter.  Practice typing.  Perform "Flicks"/hand stretches (PWR! Hands): Close hands then flick out your fingers with focus on opening hands, pulling wrists back, and extending elbows like you are pushing.                  Memory Compensation Strategies  1. Use "WARM" strategy.  W= write it down  A= associate it  R= repeat it  M= make a mental note  2.   You can keep a Social worker.  Use a 3-ring notebook with sections for the following: calendar, important names and phone numbers,  medications, doctors' names/phone numbers, lists/reminders, and a section to journal what you did  each day.   3.    Use a  calendar to write appointments down.  4.    Write yourself a schedule for the day.  This can be placed on the calendar or in a separate section of the Memory Notebook.  Keeping a  regular schedule can help memory.  5.    Use medication organizer with sections for each day or morning/evening pills.  You may need help loading it  6.    Keep a basket, or pegboard by the door.  Place items that you need to take out with you in the basket or on the pegboard.  You may also want to  include a message board for reminders.  7.    Use sticky notes.  Place sticky notes with reminders in a place where the task is performed.  For example: " turn off the  stove" placed by the stove, "lock the door" placed on the door at eye level, " take your medications" on  the bathroom mirror or by the place where you normally take your medications.  8.    Use alarms/timers.  Use while cooking to remind yourself to check on food or as a reminder to take your medicine, or as a reminder to make a call, or as a reminder to perform another task, etc.  Keeping Thinking Skills Sharp: 1. Jigsaw  puzzles 2. Card/board games 3. Talking on the phone/social events 4. Lumosity.[email or web address] 5. Online games 6. Word serches/crossword puzzles 7.  Logic puzzles 8. Aerobic exercise (stationary bike) 9. Eating balanced diet (fruits & veggies) 10. Drink water 11. Try something new--new recipe, hobby 12. Crafts 13. Do a variety of activities that are challenging 14. Add cognitive activities to walking/exercising (think of animal/food/city with each letter of the alphabet, counting backwards, thinking of as many vegetables as you can, etc.).--Only do this  If safe (no freezing/falls).Keeping Thinking Skills Sharp: 1. Jigsaw puzzles 2. Card/board games 3. Talking on the phone/social events 4. Lumosity.com 5. Online games 6. Word serches/crossword puzzles 7.  Logic puzzles 8. Aerobic exercise (stationary bike) 9. Eating balanced diet  (fruits & veggies) 10. Drink water 11. Try something new--new recipe, hobby 12. Crafts 13. Do a variety of activities that are challenging 14. Add cognitive activities to walking/exercising (think of animal/food/city with each letter of the alphabet, counting backwards, thinking of as many vegetables as you can, etc.).--Only do this  If safe (no freezing/falls).

## 2015-09-17 NOTE — Therapy (Signed)
[facility] [address] [place], [place], [address] Phone: [phone]   Fax:  [phone]  Physical Therapy Treatment  Patient Details  Name: Nicholas [name] MRN: [id number] Date of Birth: 1945/11/19 Referring Provider: Posey Pronto ([facility])  Encounter Date: 09/16/2015      PT End of Session - 09/17/15 1057    Visit Number 4   Number of Visits 17   Date for PT Re-Evaluation 10/31/15   Authorization Type Gcode every 10th visit   PT Start Time 0850   PT Stop Time 0930   PT Time Calculation (min) 40 min   Activity Tolerance Patient tolerated treatment well   Behavior During Therapy [facility] for tasks assessed/performed      Past Medical History  Diagnosis Date  . Hyperlipidemia   . Hypertension   . 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 Removal of melanoma of left foot/amputation of toes 1&25 Jul 2006  . 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 Wrist fracture surgery      70 years old-set    There were no vitals filed for this visit.  Visit Diagnosis:  Postural instability  Bradykinesia  Abnormality of gait      Subjective Assessment - 09/16/15 0852    Subjective It's so early this morning-almost still asleep   Patient Stated Goals Pt's goal for therapy is to improve movement.   Currently in Pain? No/denies                         [facility] Adult PT Treatment/Exercise - 09/17/15 0001    Ambulation/Gait   Ambulation/Gait Yes   Ambulation/Gait Assistance 5: Supervision   Ambulation Distance (Feet) 400 Feet  around gym, starts/stops/turns   Assistive device None   Gait Pattern Step-through pattern;Decreased arm swing - right;Decreased step length - right;Decreased dorsiflexion - right;Decreased trunk rotation;Wide base of support;Poor foot clearance - right   Ambulation  Surface Level;Indoor   Gait Comments Dynamic gait activities in gym area, focusing on smooth change of directions forward, side, back directions.  Ended session with gait activities into narrow, tight spaces to pick up cones, including turns with conversation tasks to increase complexity; pt needs occasional cues for widened BOS and improved foot clearance during turns.   High Level Balance   High Level Balance Activities Side stepping;Direction changes;Sudden stops;Turns  Sidestepping at counter-increased step length/pacing   High Level Balance Comments With sidestepping, cues for larger amplitude stepping.  With gait activities, starts/stops, changes of directions, with unpredictable side/forward/back stepping, then turning, progressing to narrow spaces, no episodes of freezing with gait activities.  At counter:  forward step and weightshift, back step and weigthshift, forward step and weigthshift alternating legs, 10 reps for balance recovery and initiating changes of direction.  Multidirection stepping activity for varied direction stepping activites; no loss of balance with close supervision.                    PT Short Term Goals - 09/08/15 1431    PT SHORT TERM GOAL #1   Title Pt will be independent with HEP for improved balance, transfers and gait.  TARGET 09/28/15   Time 4   Period Weeks   Status New   PT SHORT TERM GOAL #2   Title Pt  will improve TUG score to less than or equal to 13.5 seconds for decreased fall risk.   Time 4   Period Weeks   Status New   PT SHORT TERM GOAL #3   Title Pt will perform at least 8 of 10 reps of sit<>stand transfers from 18 inch surfaces or below, without UE support, independently, for improved transfer efficiency and safety.   Time 4   Period Weeks   Status New   PT SHORT TERM GOAL #4   Title Pt will improve 4-square step test to less than or equal to 25 seconds for decreased fall risk.   Time 4   Period Weeks   Status New   PT SHORT  TERM GOAL #5   Title Pt will verbalize understanding of techniques to reduce freezing episodes with gait and turns.   Time 4   Period Weeks   Status New           PT Long Term Goals - 09/08/15 1432    PT LONG TERM GOAL #1   Title Pt will verbalize understanding of fall prevention within the home environment.  TARGET 10/31/15   Time 8   Period Weeks   Status New   PT LONG TERM GOAL #2   Title Pt will improve TUG manual to less than or equal to 15 seconds for decreased fall risk/improved dual tasking with gait.   Time 8   Period Weeks   Status New   PT LONG TERM GOAL #3   Title Pt will improve 4-square step test to less than or equal to 20 seconds for decreased fall risk.   Time 8   Period Weeks   Status New   PT LONG TERM GOAL #4   Title Pt will improve Functional Gait Assessment score to at least 16/30 for decreased fall risk.   Time 8   Period Weeks   Status New   PT LONG TERM GOAL #5   Title Pt will verbalize plans for optimal continued community fitness upon D/C from PT.   Time 8   Period Weeks   Status New               Plan - 09/17/15 1057    Clinical Impression Statement Pt is demonstrating improved gait and turning ability, decreased festinating episodes, with pt appearing to utilize strategies for decreased freezing with gait and turns with less verbal cues.  Pt will continue to benefit from continued gait and balance tasks with increased complexity of tasks for improved carryover with gait and balance tasks.   Pt will benefit from skilled therapeutic intervention in order to improve on the following deficits Abnormal gait;Decreased balance;Decreased mobility;Difficulty walking;Impaired flexibility;Postural dysfunction   Rehab Potential Good   PT Frequency 2x / week   PT Duration 8 weeks   PT Treatment/Interventions ADLs/Self Care Home Management;Therapeutic exercise;Therapeutic activities;Functional mobility training;Gait training;Balance  training;Neuromuscular re-education;Patient/family education   PT Next Visit Plan Continue to practice transitional movements, varied direction stepping, and progressive difficulty of gait tasks (dual tasking)        Problem List Patient Active Problem List   Diagnosis Date Noted  . 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 Dizziness 12/30/2014  . Diastolic CHF ([place]) 123456  . Former smoker 04/29/2014  . Chronic venous insufficiency 02/20/2014  . Hyperglycemia 08/02/2012  . Obesity 07/13/2010  . MALIGNANT MELANOMA SKIN LOWER LIMB INCLUDING HIP 03/09/2009  . Insomnia 10/03/2007  . Hyperlipidemia 12/13/2006  . Essential hypertension 12/13/2006  MARRIOTT,AMY W. 09/17/2015, 11:00 AM  [name]., PT  [facility] [address] [place], [place], [address] Phone: [phone]   Fax:  [phone]  Name: RAMIEL PERNICIARO MRN: [id number] Date of Birth: 1945/11/13

## 2015-09-17 NOTE — Therapy (Signed)
[facility] [address] [place], [place], [address] Phone: [phone]   Fax:  [phone]  Occupational Therapy Treatment  Patient Details  Name: Nicholas [name] MRN: [id number] Date of Birth: 09-12-1945 Referring Provider: Dr. Patel/ Dr. Nicki Reaper  Encounter Date: 09/16/2015      OT End of Session - 09/16/15 1323    Visit Number 3   Number of Visits 17   Date for OT Re-Evaluation 11/06/15   Authorization Type [employer]   Authorization - Visit Number 3   Authorization - Number of Visits 10   OT Start Time [id number]   OT Stop Time 1400   OT Time Calculation (min) 43 min   Activity Tolerance Patient tolerated treatment well   Behavior During Therapy [facility] for tasks assessed/performed      Past Medical History  Diagnosis Date  . Hyperlipidemia   . Hypertension   . 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 Removal of melanoma of left foot/amputation of toes 1&25 Jul 2006  . 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 Wrist fracture surgery      70 years old-set    There were no vitals filed for this visit.  Visit Diagnosis:  Decreased coordination  Muscle rigidity  Bradykinesia           Reviewed PWR! modified quadraped basic 4 x 10 reps each, min v.c. Then pt returned demonstration. Coordination HEP issued, pt returned demonstration with min-mod v.c. For larger movements.                 OT Education - 09/16/15 1352    Education provided Yes   Education Details coordination HEP, memory compensations, keeping thinking skills sharp   Person(s) Educated Patient   Methods Explanation;Demonstration;Handout   Comprehension Verbalized understanding;Returned demonstration          OT Short Term Goals - 09/09/15 1312    OT SHORT TERM GOAL #1   Title I with PD specific HEP   Time 4   Period Weeks   Status New   OT SHORT TERM GOAL #2   Title Pt will verbalize understanding of adapted strategies for ADLs/IADLS.   Time 4   Period Weeks   Status New   OT SHORT TERM GOAL #3   Title Pt will demonstrate ability to write a short paragraph with 100% legibility and no significant decrease in letter size.   Time 4   Period Weeks   Status New   OT SHORT TERM GOAL #4   Title Pt will perform 3 button/ unbutton task in 32 secs or less   Baseline 37.65 secs   Time 4   Period Weeks   Status New           OT Long Term Goals - 09/09/15 1513    OT LONG TERM GOAL #1   Title Pt will verbalize understanding of ways to prevent future PD related complications and verbalize understanding of community resources.   Time 8   Period Weeks   Status New   OT LONG TERM GOAL #2   Title Pt will decrease PPT#2(simulated feeding) to 10 secs or less   Baseline 13.41 secs   Time 8   Period Weeks   Status New   OT LONG TERM GOAL #3   Title Pt will  verbalize understanding of ways to keep thinking skills sharp and compensations for short term memory.   Time 8   Period Weeks   Status New   OT LONG TERM GOAL #4   Title Pt will improve RUE standing functional reach to 10 inches or greater.   Baseline baseline: 9.5 inches, LUE 11 inches   Time 8   Period Weeks   Status New               Plan - 09/17/15 1012    Clinical Impression Statement Pt is progressing towards goals. He demonstrates understanding of PWr! moves in modified quadraped and coordination HEP.   Pt will benefit from skilled therapeutic intervention in order to improve on the following deficits (Retired) Abnormal gait;Decreased coordination;Decreased range of motion;Difficulty walking;Decreased safety awareness;Decreased activity tolerance;Impaired tone;Pain;Impaired UE functional use;Decreased balance;Decreased knowledge of use of DME;Decreased cognition;Decreased mobility;Decreased strength   Rehab Potential Good    OT Frequency 2x / week   OT Duration 8 weeks   OT Treatment/Interventions Self-care/ADL training;Therapeutic exercise;Patient/family education;Balance training;Therapeutic exercises;Manual Therapy;Neuromuscular education;Energy conservation;Parrafin;DME and/or AE instruction;Therapeutic activities;Fluidtherapy;Passive range of motion;Moist Heat   Plan add to PWR! exercises   OT Home Exercise Plan issued: PWR! modified quadraped, handwriting strategies(PT issued PWR! seated)   Consulted and Agree with Plan of Care Patient        Problem List Patient Active Problem List   Diagnosis Date Noted  . 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 Dizziness 12/30/2014  . Diastolic CHF ([place]) 123456  . Former smoker 04/29/2014  . Chronic venous insufficiency 02/20/2014  . Hyperglycemia 08/02/2012  . Obesity 07/13/2010  . MALIGNANT MELANOMA SKIN LOWER LIMB INCLUDING HIP 03/09/2009  . Insomnia 10/03/2007  . Hyperlipidemia 12/13/2006  . Essential hypertension 12/13/2006    Jonelle Bann 09/17/2015, 10:14 AM Theone Murdoch, OTR/L [phone] Phone: [phone] 10:14 AM 09/17/2015 [facility] [address] [place], [place], [address] Phone: [phone]   Fax:  [phone]  Name: Nicholas [name] MRN: [id number] Date of Birth: 09/24/1945

## 2015-09-22 ENCOUNTER — Ambulatory Visit: Payer: PPO | Admitting: Occupational Therapy

## 2015-09-22 ENCOUNTER — Ambulatory Visit: Payer: PPO

## 2015-09-22 ENCOUNTER — Ambulatory Visit: Payer: PPO | Attending: Neurology | Admitting: Physical Therapy

## 2015-09-22 DIAGNOSIS — R293 Abnormal posture: Secondary | ICD-10-CM

## 2015-09-22 DIAGNOSIS — R278 Other lack of coordination: Secondary | ICD-10-CM

## 2015-09-22 DIAGNOSIS — R4189 Other symptoms and signs involving cognitive functions and awareness: Secondary | ICD-10-CM

## 2015-09-22 DIAGNOSIS — R471 Dysarthria and anarthria: Secondary | ICD-10-CM | POA: Diagnosis not present

## 2015-09-22 DIAGNOSIS — R258 Other abnormal involuntary movements: Secondary | ICD-10-CM | POA: Insufficient documentation

## 2015-09-22 DIAGNOSIS — R29898 Other symptoms and signs involving the musculoskeletal system: Secondary | ICD-10-CM

## 2015-09-22 DIAGNOSIS — R269 Unspecified abnormalities of gait and mobility: Secondary | ICD-10-CM | POA: Diagnosis not present

## 2015-09-22 DIAGNOSIS — R2689 Other abnormalities of gait and mobility: Secondary | ICD-10-CM | POA: Insufficient documentation

## 2015-09-22 DIAGNOSIS — R279 Unspecified lack of coordination: Secondary | ICD-10-CM | POA: Insufficient documentation

## 2015-09-22 NOTE — Therapy (Signed)
[facility] [address] [place], [place], [address] Phone: [phone]   Fax:  [phone]  Speech Language Pathology Treatment  Patient Details  Name: Nicholas [name] MRN: [id number] Date of Birth: [birth year] Referring Provider: Narda Amber, M.D.  Encounter Date: 09/22/2015      End of Session - 09/22/15 1650    Visit Number 4   Number of Visits 17   Date for SLP Re-Evaluation 11/04/15   SLP Start Time 1532   SLP Stop Time  1606  pt asked to leave due to fatigue   SLP Time Calculation (min) 34 min   Activity Tolerance Patient tolerated treatment well      Past Medical History  Diagnosis Date  . Hyperlipidemia   . Hypertension   . 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 Removal of melanoma of left foot/amputation of toes 1&25 Jul 2006  . 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 Wrist fracture surgery      70 years old-set    There were no vitals filed for this visit.  Visit Diagnosis: Dysarthria      Subjective Assessment - 09/22/15 1542    Subjective "They worked me hard." (pt, re: OT)   Currently in Pain? No/denies               ADULT SLP TREATMENT - 09/22/15 1543    General Information   Behavior/Cognition Alert;Cooperative;Pleasant mood   Treatment Provided   Treatment provided Cognitive-Linquistic   Cognitive-Linquistic Treatment   Treatment focused on Dysarthria   Skilled Treatment Loud /a/ utilized to recalibrate pt's conversational loudness - average 93dB. In multisentene tasks pt maintained 71dB without cues. Pt states he cont to feel like his "yelling" however notes that frequency of people asking him to repeat has decreased. SLP took this opportunity to draw the relationship between these two thoughts that pt feels like he is"yelling" yet people are asking him to repeat less than  prior to ST. Outside, pt was able to maintain intelligible speech nearly 100% of the time. When pt reduced loudness he spontaneously incr'd (self-corrected).   Assessment / Recommendations / Plan   Plan Continue with current plan of care   Progression Toward Goals   Progression toward goals Progressing toward goals            SLP Short Term Goals - 09/22/15 1651    SLP SHORT TERM GOAL #1   Title pt will sustain loud /a/ at 94dB over 4 consecutive sessions   Time 3   Period Weeks   Status On-going   SLP SHORT TERM GOAL #2   Title pt will exhibit volume of 70dB in sentence responses 95%   Status Achieved   SLP SHORT TERM GOAL #3   Title pt will report decr of frequency of people asking him to repeat    Status Achieved          SLP Long Term Goals - 09/15/15 1627    SLP LONG TERM GOAL #1   Title pt will sustain loud /a/ at average 94dB over 6 consecutive sessions with SBA   Time 7   Period Weeks   Status On-going   SLP LONG TERM GOAL #2   Title pt will generate speech in mod complex conversation with average 70dB over 10 minutes   Time  7   Period Weeks   Status On-going          Plan - 09/22/15 1651    Clinical Impression Statement  Pt would benefit from cont ST targeting WNL volume across speaking situations. Suspect d/c in 1-2 more weeks (1-68more visits).   Speech Therapy Frequency 2x / week   Duration --  6 weeks   Treatment/Interventions SLP instruction and feedback;Compensatory strategies;Internal/external aids;Patient/family education;Functional tasks;Cueing hierarchy   Potential to Achieve Goals Good        Problem List Patient Active Problem List   Diagnosis Date Noted  . 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 Dizziness 12/30/2014  . Diastolic CHF ([place]) 123456  . Former smoker 04/29/2014  . Chronic venous insufficiency 02/20/2014  . Hyperglycemia 08/02/2012  . Obesity 07/13/2010  . MALIGNANT MELANOMA SKIN LOWER LIMB INCLUDING HIP  03/09/2009  . Insomnia 10/03/2007  . Hyperlipidemia 12/13/2006  . Essential hypertension 12/13/2006    [facility], [place]  09/22/2015, 4:52 PM  [facility] [address] [place], [place], [address] Phone: [phone]   Fax:  [phone]   Name: Nicholas [name] MRN: [id number] Date of Birth: Jan 25, 1946

## 2015-09-22 NOTE — Therapy (Signed)
[name] [address] [place], [place], [address] Phone: [phone]   Fax:  [phone]  Physical Therapy Treatment  Patient Details  Name: Nicholas [name] MRN: [id number] Date of Birth: 07/16/1946 Referring Provider: Posey Pronto ([facility])  Encounter Date: 09/22/2015      PT End of Session - 09/22/15 1636    Visit Number 5   Number of Visits 17   Date for PT Re-Evaluation 10/31/15   Authorization Type Gcode every 10th visit   PT Start Time 1403   PT Stop Time 1441   PT Time Calculation (min) 38 min   Equipment Utilized During Treatment Gait belt   Activity Tolerance Patient tolerated treatment well   Behavior During Therapy [facility] for tasks assessed/performed      Past Medical History  Diagnosis Date  . Hyperlipidemia   . Hypertension   . 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 Removal of melanoma of left foot/amputation of toes 1&25 Jul 2006  . 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 Wrist fracture surgery      70 years old-set    There were no vitals filed for this visit.  Visit Diagnosis:  Bradykinesia  Postural instability  Abnormality of gait      Subjective Assessment - 09/22/15 1403    Subjective No changes since last visit; went to [place] over the weekend and was a bit lazy.  No falls or stumbles   Patient Stated Goals Pt's goal for therapy is to improve movement.   Currently in Pain? No/denies                         [facility] Adult PT Treatment/Exercise - 09/22/15 1450    Ambulation/Gait   Ambulation/Gait Yes   Ambulation/Gait Assistance 5: Supervision   Ambulation Distance (Feet) 400 Feet  350 ft around gym-starts, stops, turns   Assistive device None   Gait Pattern Step-through pattern;Decreased arm swing - right;Decreased step length - right;Decreased  dorsiflexion - right;Decreased trunk rotation;Wide base of support;Poor foot clearance - right   Ambulation Surface Level;Indoor   Gait Comments Dynamic gait activities including gait with head turns to look at cards, then gait with environmental scanning tasks looking for cards on wall while tossing/catching ball; no loss of balance noted; therapist provides supervision   High Level Balance   High Level Balance Activities Side stepping  3 reps each direction-cues for amplitude of steps   High Level Balance Comments Varied direction stepping activities in forward, step, back directions for improved multi-directional stepping, with pt having slightly decreased step and weigthshift into posterior direction.  Pt performs 4-square step test activity multiple reps with min guard assistance.  Festinating pattern noted on LLE going in posterior direction and left side with stepping over obstacle in four-square activity.  Standing activities at counter on compliant surface-forward step and weightshift, back step and weightshift, side step and weightshift, with min guard assistance and cues for deliberate step placement; side step on and off of blue foam mat with UE support, cues for deliberate stepping and foot placement.         Self Care:  Discussed checking goals by next week and future POC.  Discussed PD cycling class at [facility] has the brochure and plans to have doctor sign  it next visit.  Discussed PWR! Moves exercise class and how pt may benefit from continuing PWR! Moves in exercise class situation.  Upcoming class begins late March and pt is interested.  Plan to provide the information to him next visit.           PT Short Term Goals - 09/08/15 1431    PT SHORT TERM GOAL #1   Title Pt will be independent with HEP for improved balance, transfers and gait.  TARGET 09/28/15   Time 4   Period Weeks   Status New   PT SHORT TERM GOAL #2   Title Pt will improve TUG score to less than or  equal to 13.5 seconds for decreased fall risk.   Time 4   Period Weeks   Status New   PT SHORT TERM GOAL #3   Title Pt will perform at least 8 of 10 reps of sit<>stand transfers from 18 inch surfaces or below, without UE support, independently, for improved transfer efficiency and safety.   Time 4   Period Weeks   Status New   PT SHORT TERM GOAL #4   Title Pt will improve 4-square step test to less than or equal to 25 seconds for decreased fall risk.   Time 4   Period Weeks   Status New   PT SHORT TERM GOAL #5   Title Pt will verbalize understanding of techniques to reduce freezing episodes with gait and turns.   Time 4   Period Weeks   Status New           PT Long Term Goals - 09/08/15 1432    PT LONG TERM GOAL #1   Title Pt will verbalize understanding of fall prevention within the home environment.  TARGET 10/31/15   Time 8   Period Weeks   Status New   PT LONG TERM GOAL #2   Title Pt will improve TUG manual to less than or equal to 15 seconds for decreased fall risk/improved dual tasking with gait.   Time 8   Period Weeks   Status New   PT LONG TERM GOAL #3   Title Pt will improve 4-square step test to less than or equal to 20 seconds for decreased fall risk.   Time 8   Period Weeks   Status New   PT LONG TERM GOAL #4   Title Pt will improve Functional Gait Assessment score to at least 16/30 for decreased fall risk.   Time 8   Period Weeks   Status New   PT LONG TERM GOAL #5   Title Pt will verbalize plans for optimal continued community fitness upon D/C from PT.   Time 8   Period Weeks   Status New               Plan - 09/22/15 1636    Clinical Impression Statement Pt noted to have some fesinating steps with LLE initiating steps to the left or backwards direction.  Overall, pt reports improved mobility, especially improved awareness of taking larger steps to initiate gait.  Pt will continue to benefit from further skilled PT to address gait, balance,  increased complexity of tasks.   Pt will benefit from skilled therapeutic intervention in order to improve on the following deficits Abnormal gait;Decreased balance;Decreased mobility;Difficulty walking;Impaired flexibility;Postural dysfunction   Rehab Potential Good   PT Frequency 2x / week   PT Duration 8 weeks   PT Treatment/Interventions ADLs/Self Care Home Management;Therapeutic exercise;Therapeutic activities;Functional  mobility training;Gait training;Balance training;Neuromuscular re-education;Patient/family education   PT Next Visit Plan Continue to practice transitional movements, varied direction stepping, and progressive difficulty of gait tasks (dual tasking); begin checking goals-discuss POC   Consulted and Agree with Plan of Care Patient        Problem List Patient Active Problem List   Diagnosis Date Noted  . 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 Dizziness 12/30/2014  . Diastolic CHF ([place]) 123456  . Former smoker 04/29/2014  . Chronic venous insufficiency 02/20/2014  . Hyperglycemia 08/02/2012  . Obesity 07/13/2010  . MALIGNANT MELANOMA SKIN LOWER LIMB INCLUDING HIP 03/09/2009  . Insomnia 10/03/2007  . Hyperlipidemia 12/13/2006  . Essential hypertension 12/13/2006    Champayne Kocian W. 09/22/2015, 4:39 PM [name]., PT [facility] [address] [place], [place], [address] Phone: [phone]   Fax:  [phone]  Name: Nicholas [name] MRN: [id number] Date of Birth: Nov 28, 1945

## 2015-09-22 NOTE — Therapy (Signed)
[facility] [address] [place], [place], [address] Phone: [phone]   Fax:  [phone]  Occupational Therapy Treatment  Patient Details  Name: Nicholas [name] MRN: [id number] Date of Birth: 1945-08-28 Referring Provider: Dr. Patel/ Dr. Nicki Reaper  Encounter Date: 09/22/2015      OT End of Session - 09/22/15 1553    Visit Number 4   Number of Visits 17   Date for OT Re-Evaluation 11/06/15   Authorization Type [employer]   Authorization - Visit Number 4   Authorization - Number of Visits 10   OT Start Time [id number]   OT Stop Time 1530   OT Time Calculation (min) 39 min   Activity Tolerance Patient tolerated treatment well   Behavior During Therapy [facility] for tasks assessed/performed      Past Medical History  Diagnosis Date  . Hyperlipidemia   . Hypertension   . Cancer [facility]) Jan 70  skin; hx of melanoma left foot/ amputation of toes 1&2     Past Surgical History  Procedure Laterality Date  . Removal of melanoma of left foot/amputation of toes 1&25 Jul 2006  . 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 Wrist fracture surgery      70 years old-set    There were no vitals filed for this visit. 70  Visit Diagnosis:  Decreased coordination  Muscle rigidity  Bradykinesia  Postural instability  Cognitive deficits      Subjective Assessment - 09/22/15 1450    Pertinent History melanoma, see Epic   Patient Stated Goals to write better   Currently in Pain? Yes   Pain Score 3    Pain Location Back   Pain Orientation Lower   Pain Descriptors / Indicators Aching   Pain Type Chronic pain   Pain Onset More than a month ago   Pain Frequency Intermittent   Aggravating Factors  exercise in prone   Pain Relieving Factors reposistioning   Multiple Pain Sites No                         PWR [facility]) - 09/22/15 1553    PWR! exercises Moves in supine   PWR! Up 20   PWR! Rock 20   PWR!  Twist 20   PWR! Step 20   Comments min-mod v.c. / facilitation for proper technique   PWR! Up --  attempted PWR! moves in prone, stopped due to back pain   Reaching Comments Functional step and reach to targets bilateral UE's, min v.c. for large amplitude movements and PWR! hands  amb while tossing ball and dual tasking, min-mod difficulty             OT Education - 09/22/15 1602    Education provided Yes   Education Details PWR! supine basic 4   Person(s) Educated Patient   Methods Explanation;Demonstration;Verbal cues;Handout   Comprehension Verbalized understanding;Returned demonstration;Verbal cues required          OT Short Term Goals - 09/09/15 1312    OT SHORT TERM GOAL #1   Title I with PD specific HEP   Time 4   Period Weeks   Status New   OT SHORT TERM GOAL #2   Title Pt will verbalize understanding of adapted strategies for ADLs/IADLS.   Time 4   Period Weeks   Status New   OT SHORT TERM GOAL #3   Title Pt will demonstrate ability  to write a short paragraph with 100% legibility and no significant decrease in letter size.   Time 4   Period Weeks   Status New   OT SHORT TERM GOAL #4   Title Pt will perform 3 button/ unbutton task in 32 secs or less   Baseline 37.65 secs   Time 4   Period Weeks   Status New           OT Long Term Goals - 09/22/15 1604    OT LONG TERM GOAL #1   Title Pt will verbalize understanding of ways to prevent future PD related complications and verbalize understanding of community resources.   Time 8   Period Weeks   Status On-going   OT LONG TERM GOAL #2   Title Pt will decrease PPT#2(simulated feeding) to 10 secs or less   Baseline 13.41 secs   Time 8   Period Weeks   Status On-going   OT LONG TERM GOAL #3   Title Pt will verbalize understanding of ways to keep thinking skills sharp and compensations for short term memory.   Time 8   Period Weeks   Status Achieved   OT LONG TERM GOAL #4   Title Pt will improve  RUE standing functional reach to 10 inches or greater.   Baseline baseline: 9.5 inches, LUE 11 inches   Time 8   Period Weeks   Status On-going               Plan - 09/22/15 1550    Clinical Impression Statement Pt is progressing towards goals. He demonstrates understanding of PWR! moves in supine yet can benefit from reinforcement.   Pt will benefit from skilled therapeutic intervention in order to improve on the following deficits (Retired) Abnormal gait;Decreased coordination;Decreased range of motion;Difficulty walking;Decreased safety awareness;Decreased activity tolerance;Impaired tone;Pain;Impaired UE functional use;Decreased balance;Decreased knowledge of use of DME;Decreased cognition;Decreased mobility;Decreased strength   Rehab Potential Good   OT Frequency 2x / week   OT Duration 8 weeks   OT Treatment/Interventions Self-care/ADL training;Therapeutic exercise;Patient/family education;Balance training;Therapeutic exercises;Manual Therapy;Neuromuscular education;Energy conservation;Parrafin;DME and/or AE instruction;Therapeutic activities;Fluidtherapy;Passive range of motion;Moist Heat   Plan reinforce PWR! supine, coordination/ big movements with ADLs   OT Home Exercise Plan issued: PWR! modified quadraped, handwriting strategies(PT issued PWR! seated), PWR! Supine(09/22/15)memory compensations, keeping thinking skills sharp   Consulted and Agree with Plan of Care Patient        Problem List Patient Active Problem List   Diagnosis Date Noted  . 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 Dizziness 12/30/2014  . Diastolic CHF ([place]) 123456  . Former smoker 04/29/2014  . Chronic venous insufficiency 02/20/2014  . Hyperglycemia 08/02/2012  . Obesity 07/13/2010  . MALIGNANT MELANOMA SKIN LOWER LIMB INCLUDING HIP 03/09/2009  . Insomnia 10/03/2007  . Hyperlipidemia 12/13/2006  . Essential hypertension 12/13/2006    RINE,KATHRYN 09/22/2015, 4:05 PM Theone Murdoch,  OTR/L [phone] Phone: [phone] 4:05 PM 09/22/2015 [facility] [address] [place], [place], [address] Phone: [phone]   Fax:  [phone]  Name: Nicholas [name] MRN: [id number] Date of Birth: June 13, 1946

## 2015-09-23 ENCOUNTER — Ambulatory Visit: Payer: PPO | Admitting: Occupational Therapy

## 2015-09-23 ENCOUNTER — Ambulatory Visit: Payer: PPO

## 2015-09-23 DIAGNOSIS — R278 Other lack of coordination: Secondary | ICD-10-CM

## 2015-09-23 DIAGNOSIS — R279 Unspecified lack of coordination: Secondary | ICD-10-CM

## 2015-09-23 DIAGNOSIS — R29898 Other symptoms and signs involving the musculoskeletal system: Secondary | ICD-10-CM

## 2015-09-23 DIAGNOSIS — R293 Abnormal posture: Secondary | ICD-10-CM

## 2015-09-23 DIAGNOSIS — R258 Other abnormal involuntary movements: Secondary | ICD-10-CM | POA: Diagnosis not present

## 2015-09-23 DIAGNOSIS — R471 Dysarthria and anarthria: Secondary | ICD-10-CM

## 2015-09-23 NOTE — Patient Instructions (Signed)
  Please complete the assigned speech therapy homework and return it to your next session.  

## 2015-09-23 NOTE — Therapy (Signed)
[facility] [address] [place], [place], [address] Phone: [phone]   Fax:  [phone]  Occupational Therapy Treatment  Patient Details  Name: Nicholas [name] MRN: [id number] Date of Birth: 1946/07/20 Referring Provider: Dr. Patel/ Dr. Nicki Reaper  Encounter Date: 09/23/2015      OT End of Session - 09/23/15 1703    Visit Number 5   Number of Visits 17   Date for OT Re-Evaluation 11/06/15   Authorization Type Health team [employer]--g-code   Authorization - Visit Number 5   Authorization - Number of Visits 10   OT Start Time [id number]   OT Stop Time 1400   OT Time Calculation (min) 45 min   Activity Tolerance Patient tolerated treatment well   Behavior During Therapy [facility] for tasks assessed/performed      Past Medical History  Diagnosis Date  . Hyperlipidemia   . Hypertension   . 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 Removal of melanoma of left foot/amputation of toes 1&25 Jul 2006  . 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 Wrist fracture surgery      70 years old-set    There were no vitals filed for this visit.  Visit Diagnosis:  Bradykinesia  Muscle rigidity  Postural instability  Decreased coordination      Subjective Assessment - 09/23/15 1716    Subjective  "Why is my left hand doing better than my right hand"  Pt reports soreness after exercise yesterday.   Pertinent History melanoma, see Epic   Patient Stated Goals to write better   Currently in Pain? Yes   Pain Score 4    Pain Location Shoulder   Pain Orientation Right;Left   Pain Descriptors / Indicators Sore   Pain Type Acute pain   Pain Onset Yesterday   Pain Frequency Intermittent   Aggravating Factors  exercise   Pain Relieving Factors rest                      OT Treatments/Exercises (OP) - 09/23/15 0001    Fine Motor  Coordination   Fine Motor Coordination Flipping cards;Dealing card with thumb;Picking up coins;Tossing ball   Flipping cards with each hand with min-mod cues for larger amplitude movements    Dealing card with thumb with each hand with min-mod cueing for larger amplitude movements   Picking up coins with min cueing for PWR! hands with each UE   Tossing ball with each hand and then between hands with min cues for large amplitude   Other Fine Motor Exercises Rotating ball with each hand with min cues for large amplitude   Other Fine Motor Exercises Pt with incr diffiuclty/cueing with R hand           [facility]) - 09/22/15 1553    PWR! exercises Moves in supine   PWR! Up 20   PWR! Rock 20   PWR! Twist 20   PWR! Step 20   Comments min-mod v.c. / facilitation for proper technique   PWR! Up --  attempted PWR! moves in prone, stopped due to back pain   Reaching Comments Functional step and reach to targets bilateral UE's, min v.c. for large amplitude movements and PWR! hands  amb while tossing ball and dual tasking, min-mod difficulty  OT Education - 09/23/15 1719    Education Details Reviewed supine PWR! moves; Importance of using big amplitude movements to prevent future complications and improve ease of functional activities.   Person(s) Educated Patient   Methods Explanation;Demonstration;Verbal cues   Comprehension Verbalized understanding;Returned demonstration;Verbal cues required  with min cueing for larger amplitude movements          OT Short Term Goals - 09/09/15 1312    OT SHORT TERM GOAL #1   Title I with PD specific HEP   Time 4   Period Weeks   Status New   OT SHORT TERM GOAL #2   Title Pt will verbalize understanding of adapted strategies for ADLs/IADLS.   Time 4   Period Weeks   Status New   OT SHORT TERM GOAL #3   Title Pt will demonstrate ability to write a short paragraph with 100% legibility and no significant decrease in letter size.    Time 4   Period Weeks   Status New   OT SHORT TERM GOAL #4   Title Pt will perform 3 button/ unbutton task in 32 secs or less   Baseline 37.65 secs   Time 4   Period Weeks   Status New           OT Long Term Goals - 09/22/15 1604    OT LONG TERM GOAL #1   Title Pt will verbalize understanding of ways to prevent future PD related complications and verbalize understanding of community resources.   Time 8   Period Weeks   Status On-going   OT LONG TERM GOAL #2   Title Pt will decrease PPT#2(simulated feeding) to 10 secs or less   Baseline 13.41 secs   Time 8   Period Weeks   Status On-going   OT LONG TERM GOAL #3   Title Pt will verbalize understanding of ways to keep thinking skills sharp and compensations for short term memory.   Time 8   Period Weeks   Status Achieved   OT LONG TERM GOAL #4   Title Pt will improve RUE standing functional reach to 10 inches or greater.   Baseline baseline: 9.5 inches, LUE 11 inches   Time 8   Period Weeks   Status On-going               Plan - 09/23/15 1714    Clinical Impression Statement Pt is progressing towards goals and is reponding well to cueing for larger amplitude movements.  Decr activity tolerance is a barrier.     Plan continue with PWR! moves, coordination, big movements with ADLs   OT Home Exercise Plan issued: PWR! modified quadraped, handwriting strategies(PT issued PWR! seated), PWR! supine(09/22/15)   Consulted and Agree with Plan of Care Patient        Problem List Patient Active Problem List   Diagnosis Date Noted  . 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 Dizziness 12/30/2014  . Diastolic CHF ([place]) 123456  . Former smoker 04/29/2014  . Chronic venous insufficiency 02/20/2014  . Hyperglycemia 08/02/2012  . Obesity 07/13/2010  . MALIGNANT MELANOMA SKIN LOWER LIMB INCLUDING HIP 03/09/2009  . Insomnia 10/03/2007  . Hyperlipidemia 12/13/2006  . Essential hypertension 12/13/2006     [facility] 09/23/2015, 5:27 PM  [facility] [address] [place], [place], [address] Phone: [phone]   Fax:  [phone]  Name: Nicholas [name] MRN: [id number] Date of Birth: [birth year]  Vianne Bulls, OTR/L [facility] [address] [place],   [address] [phone] phone [phone] 09/23/2015 5:27 PM

## 2015-09-23 NOTE — Therapy (Signed)
[facility] [address] [place], [place], [address] Phone: [phone]   Fax:  [phone]  Speech Language Pathology Treatment  Patient Details  Name: Nicholas [name] MRN: [id number] Date of Birth: 10/15/1945 Referring Provider: Narda Amber, M.D.  Encounter Date: 09/23/2015      End of Session - 09/23/15 1535    Visit Number 5   Number of Visits 17   Date for SLP Re-Evaluation 11/04/15   SLP Start Time 1448   SLP Stop Time  [id number]   SLP Time Calculation (min) 43 min   Activity Tolerance Other (comment)  pt asked for easier therapy tasks instead of mod-max complex linguistic tasks      Past Medical History  Diagnosis Date  . Hyperlipidemia   . Hypertension   . 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 Removal of melanoma of left foot/amputation of toes 1&25 Jul 2006  . 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 Wrist fracture surgery      70 years old-set    There were no vitals filed for this visit.  Visit Diagnosis: Dysarthria      Subjective Assessment - 09/23/15 1531    Subjective "I think I have one more visit left with you."               ADULT SLP TREATMENT - 09/23/15 1457    General Information   Behavior/Cognition Alert;Cooperative;Pleasant mood   Treatment Provided   Treatment provided Cognitive-Linquistic   Cognitive-Linquistic Treatment   Treatment focused on Dysarthria   Skilled Treatment Loudness of loud /a/ in order to recalibrate pt's conversational loudness with average 94dB. In mod complex opinions pt remained at WNL volume 80% of the time with rare min A (70% without cues). Outside, pt maintained loudness appropriate to be heard over road and traffic noise.  SLP stressed the importance of recalibration of WNL voice is best when more complex speaking situation, like today's structured  tasks, are targeted. (Pt asked a few times if he could read somthing instead of answer more linguistically complex questions.)   Assessment / Recommendations / [place] with current plan of care   Progression Toward Goals   Progression toward goals Progressing toward goals          SLP Education - 09/23/15 1534    Education provided Yes   Education Details need for more challenging linguistic material is necessary when thinking about loudness   Person(s) Educated Patient   Methods Explanation   Comprehension Verbalized understanding          SLP Short Term Goals - 09/22/15 1651    SLP SHORT TERM GOAL #1   Title pt will sustain loud /a/ at 94dB over 4 consecutive sessions   Time 3   Period Weeks   Status On-going   SLP SHORT TERM GOAL #2   Title pt will exhibit volume of 70dB in sentence responses 95%   Status Achieved   SLP [place] #3   Title pt will report decr of frequency of people asking him to repeat    Status Achieved          SLP Long Term Goals - 09/15/15 1627    SLP LONG TERM GOAL #1   Title pt will sustain loud /a/ at average 94dB over  6 consecutive sessions with SBA   Time 7   Period Weeks   Status On-going   SLP LONG TERM GOAL #2   Title pt will generate speech in mod complex conversation with average 70dB over 10 minutes   Time 7   Period Weeks   Status On-going          Plan - 09/23/15 1536    Clinical Impression Statement  Pt would benefit from cont ST targeting WNL volume across speaking situations. Suspect d/c in 1-2 more weeks (approx 2 more visits).   Speech Therapy Frequency 2x / week   Duration --  6 weeks   Treatment/Interventions SLP instruction and feedback;Compensatory strategies;Internal/external aids;Patient/family education;Functional tasks;Cueing hierarchy   Potential to Achieve Goals Good        Problem List Patient Active Problem List   Diagnosis Date Noted  . 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 Dizziness 12/30/2014  . Diastolic CHF ([place]) 123456  . Former smoker 04/29/2014  . Chronic venous insufficiency 02/20/2014  . Hyperglycemia 08/02/2012  . Obesity 07/13/2010  . MALIGNANT MELANOMA SKIN LOWER LIMB INCLUDING HIP 03/09/2009  . Insomnia 10/03/2007  . Hyperlipidemia 12/13/2006  . Essential hypertension 12/13/2006    [facility]  09/23/2015, 3:37 PM  [facility] [address] [place], [place], [address] Phone: [phone]   Fax:  [phone]   Name: Nicholas [name] MRN: [id number] Date of Birth: 06/14/1946

## 2015-09-24 ENCOUNTER — Encounter: Payer: Self-pay | Admitting: Family Medicine

## 2015-09-24 ENCOUNTER — Ambulatory Visit: Payer: PPO | Admitting: Physical Therapy

## 2015-09-24 ENCOUNTER — Ambulatory Visit (INDEPENDENT_AMBULATORY_CARE_PROVIDER_SITE_OTHER): Payer: PPO | Admitting: Family Medicine

## 2015-09-24 ENCOUNTER — Ambulatory Visit: Payer: PPO | Admitting: Family Medicine

## 2015-09-24 VITALS — BP 150/90 | HR 85 | Temp 98.8°F | Wt 224.0 lb

## 2015-09-24 DIAGNOSIS — R258 Other abnormal involuntary movements: Secondary | ICD-10-CM

## 2015-09-24 DIAGNOSIS — G20C Parkinsonism, unspecified: Secondary | ICD-10-CM

## 2015-09-24 DIAGNOSIS — R269 Unspecified abnormalities of gait and mobility: Secondary | ICD-10-CM

## 2015-09-24 DIAGNOSIS — R0683 Snoring: Secondary | ICD-10-CM

## 2015-09-24 DIAGNOSIS — I10 Essential (primary) hypertension: Secondary | ICD-10-CM | POA: Diagnosis not present

## 2015-09-24 DIAGNOSIS — G232 Striatonigral degeneration: Secondary | ICD-10-CM | POA: Diagnosis not present

## 2015-09-24 DIAGNOSIS — R2689 Other abnormalities of gait and mobility: Secondary | ICD-10-CM

## 2015-09-24 DIAGNOSIS — R293 Abnormal posture: Secondary | ICD-10-CM

## 2015-09-24 MED ORDER — METOPROLOL SUCCINATE ER 25 MG PO TB24: 25.0000 mg | ORAL_TABLET | Freq: Every day | ORAL | Status: DC

## 2015-09-24 NOTE — Assessment & Plan Note (Signed)
S: Sinemet has been increased by [facility] and he has seen improved benefit- on 1.5 in AM, 1 in afternoon, 1 with dinner.  A/P: he reports he is doing well with PT but is going to do a spin class for parkinsons- will drop off form for me to sign.

## 2015-09-24 NOTE — Assessment & Plan Note (Signed)
S: poor control on no medicine. Stopped hctz and dizziness resolved. Reported prior dizziness on lisinopril. Wants to retrial toprol xl BP Readings from Last 3 Encounters:  09/24/15 150/90  09/10/15 135/95  08/12/15 120/80  A/P: trial low dose 25mg  toprol XL with 4 week follow up- had also reported prior dizziness. With edema- avoid amlodipine. HCTZ and lisinopril caused dizziness. Another option would be trial 2.5 or 5 of lisinopril or losartan

## 2015-09-24 NOTE — Progress Notes (Signed)
[name], MD  Subjective:  Nicholas [name] is a 70 y.o. year old very pleasant male patient who presents for/with See problem oriented charting ROS- no recent dizziness, no chest pain or shortness of breath, continued severe swelling  Past Medical History-  Patient Active Problem List   Diagnosis Date Noted  . 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 Diastolic CHF ([place]) 123456    Priority: High  . MALIGNANT MELANOMA SKIN LOWER LIMB INCLUDING HIP 03/09/2009    Priority: High  . Chronic venous insufficiency 02/20/2014    Priority: Medium  . Hyperglycemia 08/02/2012    Priority: Medium  . Hyperlipidemia 12/13/2006    Priority: Medium  . Essential hypertension 12/13/2006    Priority: Medium  . Former smoker 04/29/2014    Priority: Low  . Obesity 07/13/2010    Priority: Low  . Insomnia 10/03/2007    Priority: Low  . Dizziness 12/30/2014    Medications- reviewed and updated Current Outpatient Prescriptions  Medication Sig Dispense Refill  . carbidopa-levodopa (SINEMET IR) 25-100 MG tablet Take by mouth.    . Ibuprofen-Diphenhydramine Cit (ADVIL PM PO) Take by mouth.    . rosuvastatin (CRESTOR) 10 MG tablet Take 1 tablet (10 mg total) by mouth daily. 30 tablet 11  . metoprolol succinate (TOPROL-XL) 25 MG 24 hr tablet Take 1 tablet (25 mg total) by mouth daily. 30 tablet 5   No current facility-administered medications for this visit.    Objective: BP 150/90 mmHg  Pulse 85  Temp(Src) 98.8 F (37.1 C)  Wt 224 lb (101.606 kg) Gen: NAD, resting comfortably CV: RRR no murmurs rubs or gallops Lungs: CTAB no crackles, wheeze, rhonchi Abdomen: soft/nontender/nondistended/normal bowel sounds. No rebound or guarding.  Ext: 2+ edema on right 1+ on left under compression stockings Skin: warm, dry Neuro: grossly normal, moves all extremities, appears more steady on his feet  Assessment/Plan:  Essential hypertension S: poor control on no  medicine. Stopped hctz and dizziness resolved. Reported prior dizziness on lisinopril. Wants to retrial toprol xl BP Readings from Last 3 Encounters:  09/24/15 150/90  09/10/15 135/95  08/12/15 120/80  A/P: trial low dose 25mg  toprol XL with 4 week follow up- had also reported prior dizziness. With edema- avoid amlodipine. HCTZ and lisinopril caused dizziness. Another option would be trial 2.5 or 5 of lisinopril or losartan   Parkinson's plus syndrome ([facility]) S: Sinemet has been increased by [facility] and he has seen improved benefit- on 1.5 in AM, 1 in afternoon, 1 with dinner.  A/P: he reports he is doing well with PT but is going to do a spin class for parkinsons- will drop off form for me to sign.   Snoring, daytime sleepiness S: has reported daytime sleepiness at [facility]. Wife admits to heavy snoring. [facility] has requested OSA workup A/P:Refer for sleep study  With pulmonary. Patient may want to consider oral appliance if possible over anything on his face- he is nervous about cpap  Return in about 4 weeks (around 10/22/2015) for BP recheck. Return precautions advised.   Orders Placed This Encounter  Procedures  . Ambulatory referral to Pulmonology    Referral Priority:  Routine    Referral Type:  Consultation    Referral Reason:  Specialty Services Required    Requested Specialty:  Pulmonary Disease    Number of Visits Requested:  1    Meds ordered this encounter  Medications  . metoprolol succinate (TOPROL-XL) 25 MG 24 hr tablet  Sig: Take 1 tablet (25 mg total) by mouth daily.    Dispense:  30 tablet    Refill:  5

## 2015-09-24 NOTE — Patient Instructions (Signed)
Stop HCTZ Start Toprol back at low dose at 25mg   Follow up in 1 month for BP repeat   Refer to pulmonary for sleep study

## 2015-09-25 NOTE — Therapy (Signed)
[facility] [address] [place], [place], [address] Phone: [phone]   Fax:  [phone]  Physical Therapy Treatment  Patient Details  Name: Nicholas [name] MRN: [id number] Date of Birth: Apr 21, 1946 Referring Provider: Posey Pronto ([facility])  Encounter Date: 09/24/2015      PT End of Session - 09/25/15 1554    Visit Number 6   Number of Visits 17   Date for PT Re-Evaluation 10/31/15   Authorization Type Gcode every 10th visit   PT Start Time 0934   PT Stop Time 1016   PT Time Calculation (min) 42 min   Equipment Utilized During Treatment Gait belt   Activity Tolerance Patient tolerated treatment well   Behavior During Therapy [facility] for tasks assessed/performed      Past Medical History  Diagnosis Date  . Hyperlipidemia   . Hypertension   . Cancer [facility]) Jan 2008    skin; hx of melanoma left foot/ amputation of toes 1&2     Past Surgical History  Procedure Laterality Date  . Removal of melanoma of left foot/amputation of toes 1&25 Jul 2006  . 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 Wrist fracture surgery      70 years old-set    There were no vitals filed for this visit.  Visit Diagnosis:  Bradykinesia  Postural instability  Abnormality of gait  Festinating gait      Subjective Assessment - 09/24/15 0937    Subjective No changes since last visit   Patient Stated Goals Pt's goal for therapy is to improve movement.   Currently in Pain? No/denies                         [facility] Adult PT Treatment/Exercise - 09/25/15 0001    High Level Balance   High Level Balance Comments Dynamic balance activities in gym area, including forward/back walking, then sidestepping diagonal/changing directions and leading foot to cones, eith added conversation tasks to increase difficulty.  Practiced PWR! Up posture in context of quickly resetting posture and ending festination episodes,  several reps throughout session.  Obstacle negotiation, stepping over forward, side step and diagonal side step for increased step length and wide to avoide stepping on obstacles, with supervision.           PWR [facility]) - 09/24/15 0941    PWR! exercises Moves in standing   PWR! Up x 10   PWR! Rock x 10    PWR! Twist x 10   PWR Step x 10   Basic 4 Flow Performed standing flow with chair support x 10 reps with visual and verbal cues for sequence and for large amplitude movement.             PT Education - 09/25/15 1557    Education provided Yes   Education Details Added standing PWR! Moves Flow to HEP after instruction/demonstration today   Person(s) Educated Patient   Methods Explanation;Demonstration   Comprehension Verbalized understanding;Returned demonstration;Verbal cues required          PT Short Term Goals - 09/08/15 1431    PT SHORT TERM GOAL #1   Title Pt will be independent with HEP for improved balance, transfers and gait.  TARGET 09/28/15   Time 4   Period Weeks   Status New   PT SHORT TERM GOAL #2   Title Pt will improve TUG score to less than or equal to 13.5  seconds for decreased fall risk.   Time 4   Period Weeks   Status New   PT SHORT TERM GOAL #3   Title Pt will perform at least 8 of 10 reps of sit<>stand transfers from 18 inch surfaces or below, without UE support, independently, for improved transfer efficiency and safety.   Time 4   Period Weeks   Status New   PT SHORT TERM GOAL #4   Title Pt will improve 4-square step test to less than or equal to 25 seconds for decreased fall risk.   Time 4   Period Weeks   Status New   PT SHORT TERM GOAL #5   Title Pt will verbalize understanding of techniques to reduce freezing episodes with gait and turns.   Time 4   Period Weeks   Status New           PT Long Term Goals - 09/08/15 1432    PT LONG TERM GOAL #1   Title Pt will verbalize understanding of fall prevention within the home  environment.  TARGET 10/31/15   Time 8   Period Weeks   Status New   PT LONG TERM GOAL #2   Title Pt will improve TUG manual to less than or equal to 15 seconds for decreased fall risk/improved dual tasking with gait.   Time 8   Period Weeks   Status New   PT LONG TERM GOAL #3   Title Pt will improve 4-square step test to less than or equal to 20 seconds for decreased fall risk.   Time 8   Period Weeks   Status New   PT LONG TERM GOAL #4   Title Pt will improve Functional Gait Assessment score to at least 16/30 for decreased fall risk.   Time 8   Period Weeks   Status New   PT LONG TERM GOAL #5   Title Pt will verbalize plans for optimal continued community fitness upon D/C from PT.   Time 8   Period Weeks   Status New               Plan - 09/25/15 1555    Clinical Impression Statement Pt able to self correct festinating steps more quickly after cues of resetting posture with festinating episodes.  Pt does have more difficulty with festination with conversation and dual task activities.  Pt continues to benefit from further skilled PT to address balance, gait.   Pt will benefit from skilled therapeutic intervention in order to improve on the following deficits Abnormal gait;Decreased balance;Decreased mobility;Difficulty walking;Impaired flexibility;Postural dysfunction   Rehab Potential Good   PT Frequency 2x / week   PT Duration 8 weeks   PT Treatment/Interventions ADLs/Self Care Home Management;Therapeutic exercise;Therapeutic activities;Functional mobility training;Gait training;Balance training;Neuromuscular re-education;Patient/family education   PT Next Visit Plan Check STGs and discuss POC.   Consulted and Agree with Plan of Care Patient        Problem List Patient Active Problem List   Diagnosis Date Noted  . 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 Dizziness 12/30/2014  . Diastolic CHF ([place]) 123456  . Former smoker 04/29/2014  . Chronic venous  insufficiency 02/20/2014  . Hyperglycemia 08/02/2012  . Obesity 07/13/2010  . MALIGNANT MELANOMA SKIN LOWER LIMB INCLUDING HIP 03/09/2009  . Insomnia 10/03/2007  . Hyperlipidemia 12/13/2006  . Essential hypertension 12/13/2006    Roshonda Sperl W. 09/25/2015, 3:58 PM  [name]., PT  [facility] [address]  [place], [place], [address] Phone: [phone]   Fax:  [phone]  Name: Nicholas [name] MRN: [id number] Date of Birth: [birth year]

## 2015-09-28 ENCOUNTER — Ambulatory Visit: Payer: PPO | Admitting: Occupational Therapy

## 2015-09-28 ENCOUNTER — Ambulatory Visit: Payer: PPO | Admitting: Physical Therapy

## 2015-09-28 DIAGNOSIS — R278 Other lack of coordination: Secondary | ICD-10-CM

## 2015-09-28 DIAGNOSIS — R258 Other abnormal involuntary movements: Secondary | ICD-10-CM

## 2015-09-28 DIAGNOSIS — R4189 Other symptoms and signs involving cognitive functions and awareness: Secondary | ICD-10-CM

## 2015-09-28 DIAGNOSIS — R2689 Other abnormalities of gait and mobility: Secondary | ICD-10-CM

## 2015-09-28 DIAGNOSIS — R269 Unspecified abnormalities of gait and mobility: Secondary | ICD-10-CM

## 2015-09-28 DIAGNOSIS — R293 Abnormal posture: Secondary | ICD-10-CM

## 2015-09-28 DIAGNOSIS — R279 Unspecified lack of coordination: Secondary | ICD-10-CM

## 2015-09-28 DIAGNOSIS — R29898 Other symptoms and signs involving the musculoskeletal system: Secondary | ICD-10-CM

## 2015-09-28 NOTE — Therapy (Signed)
[facility] [address] [place], [place], [address] Phone: [phone]   Fax:  [phone]  Occupational Therapy Treatment  Patient Details  Name: Nicholas [name] MRN: [id number] Date of Birth: [birth year] Referring Provider: Dr. Patel/ Dr. Nicki Reaper  Encounter Date: 09/28/2015      OT End of Session - 09/28/15 1624    Visit Number 6   Number of Visits 17   Date for OT Re-Evaluation 11/06/15   Authorization Type [employer]--g-code   Authorization - Visit Number 6   Authorization - Number of Visits 10   OT Start Time [id number]   OT Stop Time 1445   OT Time Calculation (min) 40 min   Activity Tolerance Patient tolerated treatment well   Behavior During Therapy [facility] for tasks assessed/performed      Past Medical History  Diagnosis Date  . Hyperlipidemia   . Hypertension   . Cancer [facility]) Jan 2008    skin; hx of melanoma left foot/ amputation of toes 1&2     Past Surgical History  Procedure Laterality Date  . Removal of melanoma of left foot/amputation of toes 1&25 Jul 2006  . 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 Wrist fracture surgery      70 years old-set    There were no vitals filed for this visit.  Visit Diagnosis:  No diagnosis found.      Subjective Assessment - 09/28/15 1621    Subjective  Pt reports he showed his wife flipping cards with big technique   Pertinent History melanoma, see Epic   Patient Stated Goals to write better   Currently in Pain? Yes   Pain Score 3    Pain Location Back   Pain Orientation Lower   Pain Descriptors / Indicators Sore   Pain Type Chronic pain   Pain Onset More than a month ago   Pain Frequency Intermittent   Aggravating Factors  malpositioning   Pain Relieving Factors reposistioning   Multiple Pain Sites No            [facility] OT Assessment - 09/28/15 0001    Observation/Other Assessments   Standing Functional Reach Test RUE 11 in, LUE 11 in          Treatment: PWR! Supine,  PWR! Up, twist and rock  10-20 reps each, min v.c. For larger amplitude movements Arm bike x 6 mins level 1, pt maintained 40 RPM Discussion regarding ways to avoid  Future PD related complications pt verbalized understanding Pt practiced fastening/ unfastening buttons using PWR! Hands and then with button hook. Pt demonstrates improved performance using PWR! hands.                    OT Short Term Goals - 09/09/15 1312    OT SHORT TERM GOAL #1   Title I with PD specific HEP   Time 4   Period Weeks   Status New   OT SHORT TERM GOAL #2   Title Pt will verbalize understanding of adapted strategies for ADLs/IADLS.   Time 4   Period Weeks   Status New   OT SHORT TERM GOAL #3   Title Pt will demonstrate ability to write a short paragraph with 100% legibility and no significant decrease in letter size.   Time 4   Period Weeks   Status New   OT SHORT TERM GOAL #4   Title Pt will perform 3 button/ unbutton task in  32 secs or less   Baseline 37.65 secs   Time 4   Period Weeks   Status New           OT Long Term Goals - 09/28/15 1412    OT LONG TERM GOAL #1   Title Pt will verbalize understanding of ways to prevent future PD related complications and verbalize understanding of community resources.   Time 8   Period Weeks   Status On-going   OT LONG TERM GOAL #2   Title Pt will decrease PPT#2(simulated feeding) to 10 secs or less   Baseline 13.41 secs   Time 8   Period Weeks   Status On-going   OT LONG TERM GOAL #3   Title Pt will verbalize understanding of ways to keep thinking skills sharp and compensations for short term memory.   Time 8   Period Weeks   Status Achieved   OT LONG TERM GOAL #4   Title Pt will improve RUE standing functional reach to 10 inches or greater.   Baseline baseline: 9.5 inches, LUE 11 inches   Time 8   Period Weeks   Status Achieved  11 inches bilaterally               Plan -  09/28/15 1418    Clinical Impression Statement Pt is progressing towards goals. He demonstrates poor overall activity tolerance and requires reinforcement   Pt will benefit from skilled therapeutic intervention in order to improve on the following deficits (Retired) Abnormal gait;Decreased coordination;Decreased range of motion;Difficulty walking;Decreased safety awareness;Decreased activity tolerance;Impaired tone;Pain;Impaired UE functional use;Decreased balance;Decreased knowledge of use of DME;Decreased cognition;Decreased mobility;Decreased strength   Rehab Potential Good   OT Frequency 2x / week   OT Duration 8 weeks   OT Treatment/Interventions Self-care/ADL training;Therapeutic exercise;Patient/family education;Balance training;Therapeutic exercises;Manual Therapy;Neuromuscular education;Energy conservation;Parrafin;DME and/or AE instruction;Therapeutic activities;Fluidtherapy;Passive range of motion;Moist Heat   Plan coordination activities, big movements with ADLs.   OT Home Exercise Plan issued: PWR! modified quadraped, handwriting strategies(PT issued PWR! seated), PWR! supine(09/22/15)   Consulted and Agree with Plan of Care Patient        Problem List Patient Active Problem List   Diagnosis Date Noted  . 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 Dizziness 12/30/2014  . Diastolic CHF ([place]) 123456  . Former smoker 04/29/2014  . Chronic venous insufficiency 02/20/2014  . Hyperglycemia 08/02/2012  . Obesity 07/13/2010  . MALIGNANT MELANOMA SKIN LOWER LIMB INCLUDING HIP 03/09/2009  . Insomnia 10/03/2007  . Hyperlipidemia 12/13/2006  . Essential hypertension 12/13/2006    Keslyn Teater 09/28/2015, 4:26 PM Theone Murdoch, OTR/L [phone] Phone: [phone] 4:54 PM 03/07/2017[facility] [address] [place], [place], [address] Phone: [phone]   Fax:  [phone]  Name: Nicholas [name] MRN:  [id number] Date of Birth: 1945-07-26

## 2015-09-29 NOTE — Therapy (Signed)
[facility] [address] [place], [place], [address] Phone: [phone]   Fax:  [phone]  Physical Therapy Treatment  Patient Details  Name: Nicholas [name] MRN: [id number] Date of Birth: 1945/12/02 Referring Provider: Posey Pronto ([facility])  Encounter Date: 09/28/2015      PT End of Session - 09/29/15 1626    Visit Number 7   Number of Visits 17   Date for PT Re-Evaluation 10/31/15   Authorization Type Gcode every 10th visit   PT Start Time 1320   PT Stop Time 1400   PT Time Calculation (min) 40 min   Activity Tolerance Patient tolerated treatment well   Behavior During Therapy [facility] for tasks assessed/performed      Past Medical History  Diagnosis Date  . Hyperlipidemia   . Hypertension   . Cancer [facility]) Jan 2008    skin; hx of melanoma left foot/ amputation of toes 1&2     Past Surgical History  Procedure Laterality Date  . Removal of melanoma of left foot/amputation of toes 1&25 Jul 2006  . 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 Wrist fracture surgery      70 years old-set    There were no vitals filed for this visit.  Visit Diagnosis:  Bradykinesia  Postural instability  Abnormality of gait  Festinating gait      Subjective Assessment - 09/28/15 1322    Subjective Feel about 80% better with freezing episodes.  Did pretty well at party this past Saturday night.   Patient Stated Goals Pt's goal for therapy is to improve movement.   Currently in Pain? No/denies            [facility] PT Assessment - 09/29/15 0001    Four Square Step Test    Trial One  22.68  17.84 2nd trial                     [facility] Adult PT Treatment/Exercise - 09/29/15 0001    Transfers   Transfers Sit to Stand;Stand to Sit   Sit to Stand 5: Supervision;Without upper extremity assist;From chair/3-in-1;From elevated surface   Stand to Sit 5: Supervision;Without upper extremity assist;To  chair/3-in-1;To elevated surface   Number of Reps 10 reps;Other sets (comment)  10 reps each from 20", 18", 16" surfaces   Comments Cues for increased forward lean to avoid posterior pull/slowed sit<>stand   Ambulation/Gait   Ambulation/Gait Yes   Ambulation/Gait Assistance 5: Supervision   Ambulation Distance (Feet) 400 Feet  then 400 ft with environmental scanning   Assistive device None   Gait Pattern Step-through pattern;Decreased arm swing - right;Decreased step length - right;Decreased dorsiflexion - right;Decreased trunk rotation;Wide base of support;Poor foot clearance - right   Ambulation Surface Level;Indoor   Gait Comments Environmental scanning tasks with gait, no loss of balance noted.   Standardized Balance Assessment   Standardized Balance Assessment Timed Up and Go Test   Timed Up and Go Test   TUG Normal TUG   Normal TUG (seconds) 17.31  2nd trial 16.19 sec      Reviewed Standing PWR! Moves Flow given last visit, x 10 reps each.    Self Care:      PT Education - 09/29/15 1312    Education provided Yes   Education Details Discussed POC, progress towards goals, pt's requested plan to finish up next week; review of tips to reduce freezing with gait; provided pt with  info on PWR! Moves Ex class resuming on 10/13/15   Person(s) Educated Patient   Methods Explanation   Comprehension Verbalized understanding          PT Short Term Goals - 09/28/15 1324    PT SHORT TERM GOAL #1   Title Pt will be independent with HEP for improved balance, transfers and gait.  TARGET 09/28/15   Time 4   Period Weeks   Status Achieved   PT SHORT TERM GOAL #2   Title Pt will improve TUG score to less than or equal to 13.5 seconds for decreased fall risk.   Baseline 16.19 seconds 09/28/15   Time 4   Period Weeks   Status Not Met   PT SHORT TERM GOAL #3   Title Pt will perform at least 8 of 10 reps of sit<>stand transfers from 18 inch surfaces or below, without UE support,  independently, for improved transfer efficiency and safety.   Baseline needs verbal cues for increased forward lean to avoid posterior pull   Time 4   Period Weeks   Status Partially Met   PT SHORT TERM GOAL #4   Title Pt will improve 4-square step test to less than or equal to 25 seconds for decreased fall risk.   Baseline 17.84 seconds 09/28/15   Time 4   Period Weeks   Status Achieved   PT SHORT TERM GOAL #5   Title Pt will verbalize understanding of techniques to reduce freezing episodes with gait and turns.   Time 4   Period Weeks   Status Achieved           PT Long Term Goals - 09/28/15 1324    PT LONG TERM GOAL #1   Title Pt will verbalize understanding of fall prevention within the home environment.  TARGET 10/31/15   Time 8   Period Weeks   Status New   PT LONG TERM GOAL #2   Title Pt will improve TUG manual to less than or equal to 15 seconds for decreased fall risk/improved dual tasking with gait.   Time 8   Period Weeks   Status New   PT LONG TERM GOAL #3   Title Pt will improve 4-square step test to less than or equal to 20 seconds for decreased fall risk.   Time 8   Period Weeks   Status New   PT LONG TERM GOAL #4   Title Pt will improve Functional Gait Assessment score to at least 16/30 for decreased fall risk.   Time 8   Period Weeks   Status New   PT LONG TERM GOAL #5   Title Pt will verbalize plans for optimal continued community fitness upon D/C from PT.   Time 8   Period Weeks   Status New               Plan - 09/29/15 1626    Clinical Impression Statement Short term goals checked this visit, with STG # 1, 4, 5 met.  STG #2 not met, STG # 3 partially met.  Pt continues to report improvement in functional mobility.  Pt reports he prefers to finish therapy next week.  Will plan to further instruct/practice in dual tasking/progression of balance and gait activities and check LTGs next week.   Pt will benefit from skilled therapeutic  intervention in order to improve on the following deficits Abnormal gait;Decreased balance;Decreased mobility;Difficulty walking;Impaired flexibility;Postural dysfunction   Rehab Potential Good   PT Frequency 2x /  week   PT Duration 8 weeks   PT Treatment/Interventions ADLs/Self Care Home Management;Therapeutic exercise;Therapeutic activities;Functional mobility training;Gait training;Balance training;Neuromuscular re-education;Patient/family education   PT Next Visit Plan Dual tasking, changes of direction, discuss ways to progress HEP, plan for discharge next week per pt request   Consulted and Agree with Plan of Care Patient        Problem List Patient Active Problem List   Diagnosis Date Noted  . 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 Dizziness 12/30/2014  . Diastolic CHF ([place]) 99/14/4458  . Former smoker 04/29/2014  . Chronic venous insufficiency 02/20/2014  . Hyperglycemia 08/02/2012  . Obesity 07/13/2010  . MALIGNANT MELANOMA SKIN LOWER LIMB INCLUDING HIP 03/09/2009  . Insomnia 10/03/2007  . Hyperlipidemia 12/13/2006  . Essential hypertension 12/13/2006    Maili Shutters W. 09/29/2015, 4:31 PM  [name]., PT  [facility] [address] [place], [place], [address] Phone: [phone]   Fax:  [phone]  Name: Nicholas [name] MRN: [id number] Date of Birth: Jun 04, 1946

## 2015-09-30 ENCOUNTER — Ambulatory Visit: Payer: PPO | Admitting: Physical Therapy

## 2015-09-30 ENCOUNTER — Ambulatory Visit: Payer: PPO | Admitting: Occupational Therapy

## 2015-09-30 DIAGNOSIS — R258 Other abnormal involuntary movements: Secondary | ICD-10-CM

## 2015-09-30 DIAGNOSIS — R293 Abnormal posture: Secondary | ICD-10-CM

## 2015-09-30 DIAGNOSIS — R269 Unspecified abnormalities of gait and mobility: Secondary | ICD-10-CM

## 2015-09-30 NOTE — Patient Instructions (Signed)

## 2015-10-01 NOTE — Therapy (Signed)
[facility] [address] [place], [place], [address] Phone: [phone]   Fax:  [phone]  Patient Details  Name: Nicholas [name] MRN: [id number] Date of Birth: March 14, 1946 Referring Provider:  [name], MD  Encounter Date: 09/30/2015  Pt forgot to take his BP meds today and  BP was elevated with PT prior to OT session. Pt decided to go home and take meds prior to a business meeting. Anniemae Haberkorn 10/01/2015, 3:42 PM  [facility] [address] [place], [place], [address] Phone: [phone]   Fax:  [phone]

## 2015-10-01 NOTE — Therapy (Signed)
[facility] [address] [place], [place], [address] Phone: [phone]   Fax:  [phone]  Physical Therapy Treatment  Patient Details  Name: Nicholas [name] MRN: [id number] Date of Birth: 16-Dec-1945 Referring Provider: Posey Pronto ([facility])  Encounter Date: 09/30/2015      PT End of Session - 10/01/15 1240    Visit Number 8   Number of Visits 17   Date for PT Re-Evaluation 10/31/15   Authorization Type Gcode every 10th visit   PT Start Time 1320   PT Stop Time 1400   PT Time Calculation (min) 40 min   Activity Tolerance Patient tolerated treatment well   Behavior During Therapy [facility] for tasks assessed/performed      Past Medical History  Diagnosis Date  . Hyperlipidemia   . Hypertension   . 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 Removal of melanoma of left foot/amputation of toes 1&25 Jul 2006  . 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 Wrist fracture surgery      70 years old-set    There were no vitals filed for this visit.  Visit Diagnosis:  Bradykinesia  Postural instability  Abnormality of gait      Subjective Assessment - 09/30/15 1323    Subjective Little sore from not exercising yesterday.   Patient Stated Goals Pt's goal for therapy is to improve movement.   Currently in Pain? No/denies                         [facility] Adult PT Treatment/Exercise - 10/01/15 1233    Ambulation/Gait   Ambulation/Gait Yes   Ambulation/Gait Assistance 5: Supervision   Ambulation Distance (Feet) 400 Feet  indoors, then 600 ft outdoors, then 150 ft outdoors no poles   Assistive device --  bilateral walking poles   Gait Pattern Step-through pattern;Decreased arm swing - right;Decreased step length - right;Decreased dorsiflexion - right;Decreased trunk rotation;Wide base of  support;Poor foot clearance - right  improved foot clearance with walking poles   Ambulation Surface Level;Indoor;Unlevel;Outdoor;Paved   Gait Comments Instruction in walking poles, then cues provided for reciprocal pattern with walking pole coordination.  cues provided for increased step length.  On outdoor surfaces without walking poles, PT noted decreased foot clearance and decreased step length.   High Level Balance   High Level Balance Comments Dynamic balance activities on compliant surface:  forward step and weightshift, side step and weigthshift, then back step and weigthshift x 10 reps each on blue foam surface with UE support; marching in place x10, then forward kicks x 10 with UE support   Self-Care   Self-Care Other Self-Care Comments   Other Self-Care Comments  Discussed benefits of walking poles for people with Parkinson's disease, including improved focused arm swing, step length, foot clearance and posture, improved aerobic activity for higher level walking program.  Checked pt's blood pressure at end of session due to pt's reports of not taking BP meds today.  1st check (automatic cuff) 155/100; 2nd take (manual cuff) 154/98.  Pt denies any headache, blurred vision or general malaise.  Discussed briefly HTN symptoms and need to contact his physician with further blood pressure medication questions.           PWR [facility]) - 10/01/15 1230    PWR!  Up x 10   PWR! Rock x 10   PWR! Twist x 10   PWR Step x 10   Basic 4 Flow Performed standing PWR! Moves Flow on solid surface, then performed standing PWR! Moves Flow on compliant surface at parallel bars             PT Education - 10/01/15 1239    Education provided Yes   Education Details Fall prevention; See also self care in tx notes   Person(s) Educated Patient   Methods Explanation;Handout   Comprehension Verbalized understanding          PT Short Term Goals - 09/28/15 1324    PT SHORT TERM GOAL #1   Title Pt will  be independent with HEP for improved balance, transfers and gait.  TARGET 09/28/15   Time 4   Period Weeks   Status Achieved   PT SHORT TERM GOAL #2   Title Pt will improve TUG score to less than or equal to 13.5 seconds for decreased fall risk.   Baseline 16.19 seconds 09/28/15   Time 4   Period Weeks   Status Not Met   PT SHORT TERM GOAL #3   Title Pt will perform at least 8 of 10 reps of sit<>stand transfers from 18 inch surfaces or below, without UE support, independently, for improved transfer efficiency and safety.   Baseline needs verbal cues for increased forward lean to avoid posterior pull   Time 4   Period Weeks   Status Partially Met   PT SHORT TERM GOAL #4   Title Pt will improve 4-square step test to less than or equal to 25 seconds for decreased fall risk.   Baseline 17.84 seconds 09/28/15   Time 4   Period Weeks   Status Achieved   PT SHORT TERM GOAL #5   Title Pt will verbalize understanding of techniques to reduce freezing episodes with gait and turns.   Time 4   Period Weeks   Status Achieved           PT Long Term Goals - 09/28/15 1324    PT LONG TERM GOAL #1   Title Pt will verbalize understanding of fall prevention within the home environment.  TARGET 10/31/15   Time 8   Period Weeks   Status New   PT LONG TERM GOAL #2   Title Pt will improve TUG manual to less than or equal to 15 seconds for decreased fall risk/improved dual tasking with gait.   Time 8   Period Weeks   Status New   PT LONG TERM GOAL #3   Title Pt will improve 4-square step test to less than or equal to 20 seconds for decreased fall risk.   Time 8   Period Weeks   Status New   PT LONG TERM GOAL #4   Title Pt will improve Functional Gait Assessment score to at least 16/30 for decreased fall risk.   Time 8   Period Weeks   Status New   PT LONG TERM GOAL #5   Title Pt will verbalize plans for optimal continued community fitness upon D/C from PT.   Time 8   Period Weeks   Status New                Plan - 10/01/15 1240    Clinical Impression Statement Pt has difficulty with consistent sequence of bilateral walking poles on outdoor surfaces, but improves toward end of training.  Pt is  noted to have improved foot clearance and step length compared to no walking poles.   Pt will benefit from skilled therapeutic intervention in order to improve on the following deficits Abnormal gait;Decreased balance;Decreased mobility;Difficulty walking;Impaired flexibility;Postural dysfunction   Rehab Potential Good   PT Frequency 2x / week   PT Duration 8 weeks   PT Treatment/Interventions ADLs/Self Care Home Management;Therapeutic exercise;Therapeutic activities;Functional mobility training;Gait training;Balance training;Neuromuscular re-education;Patient/family education   PT Next Visit Plan Dual tasking, changes of direction, outdoor gait with walking poles, plan for discharge next week per pt request   Consulted and Agree with Plan of Care Patient        Problem List Patient Active Problem List   Diagnosis Date Noted  . 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 Dizziness 12/30/2014  . Diastolic CHF ([place]) 43/73/5789  . Former smoker 04/29/2014  . Chronic venous insufficiency 02/20/2014  . Hyperglycemia 08/02/2012  . Obesity 07/13/2010  . MALIGNANT MELANOMA SKIN LOWER LIMB INCLUDING HIP 03/09/2009  . Insomnia 10/03/2007  . Hyperlipidemia 12/13/2006  . Essential hypertension 12/13/2006    Linzi Ohlinger W. 10/01/2015, 12:43 PM [name]., PT [facility] [address] [place], [place], [address] Phone: [phone]   Fax:  [phone]  Name: NOHLAN BURDIN MRN: [id number] Date of Birth: Oct 11, 1945

## 2015-10-05 ENCOUNTER — Ambulatory Visit: Payer: PPO | Admitting: Occupational Therapy

## 2015-10-05 ENCOUNTER — Ambulatory Visit: Payer: PPO | Admitting: Physical Therapy

## 2015-10-05 VITALS — BP 152/102

## 2015-10-05 DIAGNOSIS — R269 Unspecified abnormalities of gait and mobility: Secondary | ICD-10-CM

## 2015-10-05 DIAGNOSIS — R258 Other abnormal involuntary movements: Secondary | ICD-10-CM

## 2015-10-05 NOTE — Therapy (Signed)
[facility] [address] [place], [place], [address] Phone: [phone]   Fax:  [phone]  Patient Details  Name: Nicholas [name] MRN: [id number] Date of Birth: 02-06-1946 Referring Provider:  [name], MD  Encounter Date: 10/05/2015  Pt left PT appointment with elevated BP. See PT note for details. Romona Murdy 10/05/2015, 2:21 PM  [facility] [address] [place], [place], [address] Phone: [phone]   Fax:  [phone]

## 2015-10-05 NOTE — Therapy (Signed)
[facility] [address] [place], [place], [address] Phone: [phone]   Fax:  [phone]  Patient Details  Name: Nicholas [name] MRN: [id number] Date of Birth: 04-01-1946 Referring Provider:  [name], MD  Encounter Date: 10/05/2015  Pt arrived at therapy this afternoon.  Reports taking his blood pressure medication; however, therapy not actually initiated due to BP 155/102.  Pt denies any symptoms.  PT attempted to contact Dr. Yong Channel, however, unable to reach via phone.  Recommended that patient follow up with Dr. Yong Channel regarding blood pressure, especially since he is planning to begin community exercise programs next week.  [name], PT 10/05/2015 2:08 PM Phone: [phone] Fax: [place] 10/05/2015, 2:06 PM  [facility] [address] [place], [place], [address] Phone: [phone]   Fax:  [phone]

## 2015-10-06 ENCOUNTER — Encounter: Payer: Self-pay | Admitting: Family Medicine

## 2015-10-07 ENCOUNTER — Ambulatory Visit: Payer: PPO | Admitting: Physical Therapy

## 2015-10-07 ENCOUNTER — Ambulatory Visit: Payer: PPO | Admitting: Occupational Therapy

## 2015-10-07 DIAGNOSIS — R4189 Other symptoms and signs involving cognitive functions and awareness: Secondary | ICD-10-CM

## 2015-10-07 DIAGNOSIS — R279 Unspecified lack of coordination: Secondary | ICD-10-CM

## 2015-10-07 DIAGNOSIS — R258 Other abnormal involuntary movements: Secondary | ICD-10-CM | POA: Diagnosis not present

## 2015-10-07 DIAGNOSIS — R293 Abnormal posture: Secondary | ICD-10-CM

## 2015-10-07 DIAGNOSIS — R269 Unspecified abnormalities of gait and mobility: Secondary | ICD-10-CM

## 2015-10-07 DIAGNOSIS — R29898 Other symptoms and signs involving the musculoskeletal system: Secondary | ICD-10-CM

## 2015-10-07 DIAGNOSIS — R278 Other lack of coordination: Secondary | ICD-10-CM

## 2015-10-07 NOTE — Patient Instructions (Signed)

## 2015-10-08 NOTE — Therapy (Signed)
[facility] [address] [place], [place], [address] Phone: [phone]   Fax:  [phone]  Occupational Therapy Treatment  Patient Details  Name: Nicholas [name] MRN: [id number] Date of Birth: 12-12-1945 Referring Provider: Dr. Patel/ Dr. Nicki Reaper  Encounter Date: 10/07/2015      OT End of Session - 10/07/15 1445    Visit Number 7   Number of Visits 17   Date for OT Re-Evaluation 11/06/15   Authorization Type [employer]--g-code   Authorization - Visit Number 7   Authorization - Number of Visits 10   OT Start Time 8101   OT Stop Time 1430  pt requested to leave early d/c visit   OT Time Calculation (min) 25 min   Activity Tolerance Patient tolerated treatment well   Behavior During Therapy [facility] for tasks assessed/performed      Past Medical History  Diagnosis Date  . Hyperlipidemia   . Hypertension   . 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 Removal of melanoma of left foot/amputation of toes 1&25 Jul 2006  . 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 Wrist fracture surgery      70 years old-set    There were no vitals filed for this visit.  Visit Diagnosis:  Bradykinesia  Abnormality of gait  Postural instability  Muscle rigidity  Decreased coordination  Cognitive deficits      Subjective Assessment - 10/07/15 1407    Subjective  Pt reports he is ready for d/c   Pertinent History melanoma, see Epic   Patient Stated Goals to write better   Currently in Pain? No/denies             Treatment: Therapist reviewed progress towards goals and checked goals. Pt would like to discharge today.                   OT Short Term Goals - 10/07/15 1408    OT SHORT TERM GOAL #1   Title I with PD specific HEP   Time 4   Period Weeks   Status Achieved   OT SHORT TERM GOAL #2   Title Pt will verbalize understanding of adapted strategies for ADLs/IADLS.   Time 4   Period Weeks   Status Achieved   OT SHORT TERM GOAL #3   Title Pt will demonstrate ability to write a short paragraph with 100% legibility and no significant decrease in letter size.   Time 4   Period Weeks   Status Achieved   OT SHORT TERM GOAL #4   Title Pt will perform 3 button/ unbutton task in 32 secs or less   Baseline 37.65 secs   Time 4   Period Weeks   Status Partially Met  32.09 secs,            OT Long Term Goals - 10/07/15 1414    OT LONG TERM GOAL #1   Title Pt will verbalize understanding of ways to prevent future PD related complications and verbalize understanding of community resources.   Time 8   Period Weeks   Status Achieved   OT LONG TERM GOAL #2   Title Pt will decrease PPT#2(simulated feeding) to 10 secs or less   Baseline 13.41 secs   Time 8   Period Weeks   Status Not Met  11.90 , 10.75 secs   OT LONG TERM GOAL #3   Title Pt will verbalize understanding of ways to keep thinking skills sharp and compensations for short term memory.   Time 8   Period Weeks   Status Achieved   OT LONG TERM GOAL #4   Title Pt will improve RUE standing functional reach to 10 inches or greater.   Baseline baseline: 9.5 inches, LUE 11 inches   Time 8   Period Weeks   Status Achieved  11 inches bilaterally               Plan - 10-22-15 1444    Clinical Impression Statement Pt reports he feels good about his progress and he would like to d/c today.   Pt will benefit from skilled therapeutic intervention in order to improve on the following deficits (Retired) Abnormal gait;Decreased coordination;Decreased range of motion;Difficulty walking;Decreased safety awareness;Decreased activity tolerance;Impaired tone;Pain;Impaired UE functional use;Decreased balance;Decreased knowledge of use of DME;Decreased cognition;Decreased mobility;Decreased strength   Rehab Potential Good    OT Frequency 2x / week   OT Duration 8 weeks   OT Treatment/Interventions Self-care/ADL training;Therapeutic exercise;Patient/family education;Balance training;Therapeutic exercises;Manual Therapy;Neuromuscular education;Energy conservation;Parrafin;DME and/or AE instruction;Therapeutic activities;Fluidtherapy;Passive range of motion;Moist Heat   Plan d/c OT          G-Codes - 2015/10/22 1656    Functional Assessment Tool Used PPT #2 11.90, 10.75, 3 button/ unbutton 32.09 secs, handwriting 100% legibile with no significant decrease in size   Functional Limitation Self care   Self Care Goal Status (V0350) At least 1 percent but less than 20 percent impaired, limited or restricted   Self Care Discharge Status [phone]) At least 1 percent but less than 20 percent impaired, limited or restricted     OCCUPATIONAL THERAPY DISCHARGE SUMMARY    Current functional level related to goals / functional outcomes: Pt made good progress overall, he did not fully meet all goals as he missed last 2 visits due to elevated BP.   Remaining deficits: Bradykinesia, decreased coordination, decreased balance, decreased activity tolerance   Education / Equipment: Pt was educated regarding : community resources/ ways to prevent future PD related complications, HEP, ways to keep thinking skills sharp and adpted techniques for ADLs. Pt verbalized understanding of all education.  Plan: Patient agrees to discharge.  Patient goals were partially met. Patient is being discharged due to the patient's request.  ?????     Problem List Patient Active Problem List   Diagnosis Date Noted  . 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 Dizziness 12/30/2014  . Diastolic CHF ([place]) 82/99/3716  . Former smoker 04/29/2014  . Chronic venous insufficiency 02/20/2014  . Hyperglycemia 08/02/2012  . Obesity 07/13/2010  . MALIGNANT MELANOMA SKIN LOWER LIMB INCLUDING HIP 03/09/2009  . Insomnia 10/03/2007  . Hyperlipidemia  12/13/2006  . Essential hypertension 12/13/2006    Emerlyn Mehlhoff 10/08/2015, 5:00 PM  [facility] [address] [place], [place], [address] Phone: [phone]   Fax:  [phone]  Name: Nicholas [name] MRN: [id number] Date of Birth: 07-19-1946

## 2015-10-08 NOTE — Therapy (Signed)
[facility] [address] [place], [place], [address] Phone: [phone]   Fax:  [phone]  Physical Therapy Treatment  Patient Details  Name: Nicholas [name] MRN: [id number] Date of Birth: 08-08-1945 Referring Provider: Posey Pronto ([facility])  Encounter Date: 10/07/2015      PT End of Session - 10/08/15 1006    Visit Number 9   Number of Visits 17   Date for PT Re-Evaluation 10/31/15   Authorization Type Gcode every 10th visit   PT Start Time 1315   PT Stop Time 1357   PT Time Calculation (min) 42 min   Activity Tolerance Patient tolerated treatment well   Behavior During Therapy [facility] for tasks assessed/performed      Past Medical History  Diagnosis Date  . Hyperlipidemia   . Hypertension   . Cancer [facility]) Jan 2008    skin; hx of melanoma left foot/ amputation of toes 1&2     Past Surgical History  Procedure Laterality Date  . Removal of melanoma of left foot/amputation of toes 1&25 Jul 2006  . 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 Wrist fracture surgery      70 years old-set    There were no vitals filed for this visit.  Visit Diagnosis:  Bradykinesia  Abnormality of gait  Postural instability      Subjective Assessment - 10/07/15 1317    Subjective Called doctor after last visit-he said he may increase my meds or change it; but my blood pressure was better when I checked it after PT visit.  BP initiation of session today 134/84   Patient Stated Goals Pt's goal for therapy is to improve movement.   Currently in Pain? No/denies            [facility] PT Assessment - 10/07/15 1325    Ambulation/Gait   Gait velocity 11.74= 2.69 ft/sec   Four Square Step Test    Trial One  14.43   Trial Two 13.45   Functional Gait  Assessment   Gait assessed  Yes   Gait Level Surface Walks 20 ft, slow speed, abnormal gait pattern, evidence for imbalance or deviates 10-15 in outside of the 12 in  walkway width. Requires more than 7 sec to ambulate 20 ft.  7.83   Change in Gait Speed Able to smoothly change walking speed without loss of balance or gait deviation. Deviate no more than 6 in outside of the 12 in walkway width.   Gait with Horizontal Head Turns Performs head turns smoothly with no change in gait. Deviates no more than 6 in outside 12 in walkway width   Gait with Vertical Head Turns Performs task with slight change in gait velocity (eg, minor disruption to smooth gait path), deviates 6 - 10 in outside 12 in walkway width or uses assistive device   Gait and Pivot Turn Pivot turns safely within 3 sec and stops quickly with no loss of balance.  2.48 sec   Step Over Obstacle Is able to step over one shoe box (4.5 in total height) but must slow down and adjust steps to clear box safely. May require verbal cueing.   Gait with Narrow Base of Support Ambulates less than 4 steps heel to toe or cannot perform without assistance.   Gait with Eyes Closed Walks 20 ft, slow speed, abnormal gait pattern, evidence for imbalance, deviates 10-15 in outside 12 in walkway width. Requires more than 9 sec to  ambulate 20 ft.  9.95 sec   Ambulating Backwards Walks 20 ft, uses assistive device, slower speed, mild gait deviations, deviates 6-10 in outside 12 in walkway width.  18.32 sec   Steps Alternating feet, must use rail.   Total Score 18   FGA comment: Improved from 11/30.  Scores <22/30 indicate increased fall risk in community dwelling adults.                     [facility] PT Treatment/Exercise - 10/07/15 1325    Transfers   Transfers Sit to Stand;Stand to Sit   Sit to Stand 6: Modified independent (Device/Increase time);Without upper extremity assist;From chair/3-in-1   Five time sit to stand comments  14.04   Stand to Sit 6: Modified independent (Device/Increase time);Without upper extremity assist;To chair/3-in-1   Ambulation/Gait   Ambulation/Gait Yes   Ambulation/Gait  Assistance 7: Independent   Ambulation Distance (Feet) 350 Feet   Assistive device None   Gait Pattern Step-through pattern;Decreased arm swing - right;Decreased step length - right;Decreased dorsiflexion - right;Decreased trunk rotation;Wide base of support;Poor foot clearance - right   Ambulation Surface Level;Indoor   Timed Up and Go Test   TUG Cognitive TUG   Normal TUG (seconds) 12.49   Cognitive TUG (seconds) 13.46   Self-Care   Self-Care Other Self-Care Comments   Other Self-Care Comments  Pt has questions about format of exercise class ([employer]) beginning next week-PT describes ex class format and positions for class.  PT reiterates importance of continuing HEP for imrpoved overall movement patterns, even with PT discharging this visit; discussed [employer] class as a good way to reinforce what pt is doing at home for HEP.  Discussed return to PT-pt opts for screen in 6-9 months.                PT Education - 10/08/15 1005    Education provided Yes   Education Details Progress with goals, plans for discharge; plans for return screen in 6-9 months; situations that would warrant return to PT sooner   Person(s) Educated Patient   Methods Explanation   Comprehension Verbalized understanding          PT Short Term Goals - 09/28/15 1324    PT SHORT TERM GOAL #1   Title Pt will be independent with HEP for improved balance, transfers and gait.  TARGET 09/28/15   Time 4   Period Weeks   Status Achieved   PT SHORT TERM GOAL #2   Title Pt will improve TUG score to less than or equal to 13.5 seconds for decreased fall risk.   Baseline 16.19 seconds 09/28/15   Time 4   Period Weeks   Status Not Met   PT SHORT TERM GOAL #3   Title Pt will perform at least 8 of 10 reps of sit<>stand transfers from 18 inch surfaces or below, without UE support, independently, for improved transfer efficiency and safety.   Baseline needs verbal cues for increased forward lean to avoid posterior  pull   Time 4   Period Weeks   Status Partially Met   PT SHORT TERM GOAL #4   Title Pt will improve 4-square step test to less than or equal to 25 seconds for decreased fall risk.   Baseline 17.84 seconds 09/28/15   Time 4   Period Weeks   Status Achieved   PT SHORT TERM GOAL #5   Title Pt will verbalize understanding of techniques to reduce freezing  episodes with gait and turns.   Time 4   Period Weeks   Status Achieved           PT Long Term Goals - 10/27/15 1319    PT LONG TERM GOAL #1   Title Pt will verbalize understanding of fall prevention within the home environment.  TARGET 10/31/15   Time 8   Period Weeks   Status Achieved   PT LONG TERM GOAL #2   Title Pt will improve TUG manual to less than or equal to 15 seconds for decreased fall risk/improved dual tasking with gait.   Baseline 13.46 sec   Time 8   Period Weeks   Status Achieved   PT LONG TERM GOAL #3   Title Pt will improve 4-square step test to less than or equal to 20 seconds for decreased fall risk.   Time 8   Period Weeks   Status Achieved   PT LONG TERM GOAL #4   Title Pt will improve Functional Gait Assessment score to at least 16/30 for decreased fall risk.   Baseline 18/30   Time 8   Period Weeks   Status Achieved   PT LONG TERM GOAL #5   Title Pt will verbalize plans for optimal continued community fitness upon D/C from PT.   Baseline plans to start [employer] ex class and cycling class at [facility]   Time 8   Period Weeks   Status Achieved               Plan - 10/08/15 1008    Clinical Impression Statement Pt has met all long term goals and pt reports significant reduction in freezing episodes during daily activities.  He is able to verbalize and demonstrate techniques he uses to decrease freezing with gait.  Pt plans to join several PD-specific exercise classes in the community upon discharge; pt is appropriate for discharge this visit.   Pt will benefit from skilled therapeutic  intervention in order to improve on the following deficits Abnormal gait;Decreased balance;Decreased mobility;Difficulty walking;Impaired flexibility;Postural dysfunction   Rehab Potential Good   PT Frequency 2x / week   PT Duration 8 weeks   PT Treatment/Interventions ADLs/Self Care Home Management;Therapeutic exercise;Therapeutic activities;Functional mobility training;Gait training;Balance training;Neuromuscular re-education;Patient/family education   PT Next Visit Plan Discharge this visit   Consulted and Agree with Plan of Care Patient          G-Codes - 2015/10/27 1010    Functional Assessment Tool Used TUG 12.49, TUG cog 13.46, FGA 18/30, FSST 13.45 sec; 5x sit<>stand 14.04 sec   Functional Limitation Mobility: Walking and moving around   Mobility: Walking and Moving Around Goal Status [phone]) At least 20 percent but less than 40 percent impaired, limited or restricted   Mobility: Walking and Moving Around Discharge Status [phone]) At least 1 percent but less than 20 percent impaired, limited or restricted      Problem List Patient Active Problem List   Diagnosis Date Noted  . 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 Dizziness 12/30/2014  . Diastolic CHF ([place]) 28/36/6294  . Former smoker 04/29/2014  . Chronic venous insufficiency 02/20/2014  . Hyperglycemia 08/02/2012  . Obesity 07/13/2010  . MALIGNANT MELANOMA SKIN LOWER LIMB INCLUDING HIP 03/09/2009  . Insomnia 10/03/2007  . Hyperlipidemia 12/13/2006  . Essential hypertension 12/13/2006    Blayden Conwell W. 10/08/2015, 10:12 AM [name]., PT [place] [address] [place], [place], [address] Phone: [phone]  Fax:  [phone]  Name: Nicholas [name] MRN: [id number] Date of Birth: [birth year]   PHYSICAL THERAPY DISCHARGE SUMMARY  Visits from Start of Care: 9  Current functional level related to goals / functional outcomes: See long term  goals above-pt has met 5 of 5 goals   Remaining deficits: Balance, festinating/freezing episodes with gait (significantly lessened since beginning of therapy)   Education / Equipment: Pt has been instructed in HEP, fall prevention, tips to reduce freezing with gait, community fitness upon discharge, with pt return demo/verbalize understanding.  Plan: Patient agrees to discharge.  Patient goals were met. Patient is being discharged due to meeting the stated rehab goals.  ?????Pt is pleased with his current functional level.    [name], PT 10/08/2015 10:14 AM Phone: [phone] Fax: [phone]

## 2015-11-15 ENCOUNTER — Encounter: Payer: Self-pay | Admitting: Pulmonary Disease

## 2015-11-15 ENCOUNTER — Ambulatory Visit (INDEPENDENT_AMBULATORY_CARE_PROVIDER_SITE_OTHER): Payer: PPO | Admitting: Pulmonary Disease

## 2015-11-15 VITALS — BP 152/92 | HR 71

## 2015-11-15 DIAGNOSIS — R05 Cough: Secondary | ICD-10-CM

## 2015-11-15 DIAGNOSIS — G4710 Hypersomnia, unspecified: Secondary | ICD-10-CM | POA: Diagnosis not present

## 2015-11-15 DIAGNOSIS — E669 Obesity, unspecified: Secondary | ICD-10-CM | POA: Diagnosis not present

## 2015-11-15 DIAGNOSIS — R059 Cough, unspecified: Secondary | ICD-10-CM | POA: Insufficient documentation

## 2015-11-15 DIAGNOSIS — R0600 Dyspnea, unspecified: Secondary | ICD-10-CM

## 2015-11-15 DIAGNOSIS — G232 Striatonigral degeneration: Secondary | ICD-10-CM

## 2015-11-15 NOTE — Patient Instructions (Signed)
1. We will schedule you for a home sleep study. 2. We will get a chest X ray. 3. We will call you with results of both.  Return to clinic after studies.

## 2015-11-15 NOTE — Assessment & Plan Note (Signed)
Recent hypersomnia, frequent awakenings, nocturia, unrefreshed sleep. Also has parkinsonism. Neck circ 17 in ESS 12  Plan : 1. Need HST. 2. If (-), no need to f/u. Advised on pt to observe muscle weakness with PD. 3. If (+), likely will want cpap only if significant.  He does not necessarily like cpap.

## 2015-11-15 NOTE — Assessment & Plan Note (Signed)
Stable on sinemet. WOF worsening muscular weakness > may need vent later on. D/w pt.

## 2015-11-15 NOTE — Assessment & Plan Note (Signed)
Weight reduction 

## 2015-11-15 NOTE — Assessment & Plan Note (Signed)
Recent cough and cold. H/O melanoma in remission. Plan for CXR. May need chest ct scan.

## 2015-11-15 NOTE — Progress Notes (Signed)
Subjective:    Patient ID: Nicholas [name], male    DOB: 03/17/1946, 70 y.o.   MRN: [id number]  HPI   This is the case of Nicholas [name], 70 y.o. Male, who was referred by Dr. [name] in consultation regarding possible OSA.   As you very well know, patient  Has a 20 PY smoking history,quit 16 yrs.  Not been dxed with asthma or copd.   Frequent awakenings at night -- worse the last year. Has nocturia. Pt is snoring. Occasional gasping or choking.  Sleeeps 7-8 hrs/night. Wakes up refreshed.  Naps in pm -- 30 minutes.  (-) abnormal behavior in sleep.  Hypersomnia affects fxnality.   Dxed with parkinsonism  in 06/2015. Better with Sinemet.         Review of Systems  Constitutional: Negative.  Negative for fever and unexpected weight change.  HENT: Negative.  Negative for congestion, dental problem, ear pain, nosebleeds, postnasal drip, rhinorrhea, sinus pressure, sneezing, sore throat and trouble swallowing.   Eyes: Negative.  Negative for redness and itching.  Respiratory: Positive for cough and shortness of breath. Negative for chest tightness and wheezing.   Cardiovascular: Positive for palpitations and leg swelling.  Gastrointestinal: Negative.  Negative for nausea and vomiting.  Endocrine: Negative.   Genitourinary: Negative.  Negative for dysuria.  Musculoskeletal: Negative.  Negative for joint swelling.  Skin: Negative.  Negative for rash.  Allergic/Immunologic: Negative.   Neurological: Negative.  Negative for headaches.  Hematological: Bruises/bleeds easily.  Psychiatric/Behavioral: Negative.  Negative for dysphoric mood. The patient is not nervous/anxious.    Past Medical History  Diagnosis Date  . Hyperlipidemia   . Hypertension   . Cancer [facility]) Jan 2008    skin; hx of melanoma left foot/ amputation of toes 1&2    (-) other cancer. (-) DVT  Family History  Problem Relation Age of Onset  . Hypertension Father   . CVA Father     age 58  .  Hyperlipidemia Father   . Other Mother     Deceased     Past Surgical History  Procedure Laterality Date  . Removal of melanoma of left foot/amputation of toes 1&25 Jul 2006  . 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 Wrist fracture surgery      70 years old-set    Social History   Social History  . Marital Status: Married    Spouse Name: N/A  . Number of Children: N/A  . Years of Education: N/A   Occupational History  . Not on file.   Social History Main Topics  . Smoking status: Former Smoker -- 1.00 packs/day for 16 years    Types: Cigarettes    Quit date: 12/22/1993  . Smokeless tobacco: Not on file  . Alcohol Use: 12.6 oz/week    21 Standard drinks or equivalent per week     Comment: Drink 1-2 glasses of wine nightly  . Drug Use: No  . Sexual Activity: Not on file   Other Topics Concern  . Not on file   Social History Narrative   Married 1981. Wife has kids-1 with 1 adopted grandchild.       Retired Tax adviser      Highest level of education:  B.S.      Hobbies: antiques, former Air cabin crew      Exercise: none currently.      No Known Allergies   Outpatient Prescriptions Prior to Visit  Medication Sig Dispense Refill  .  carbidopa-levodopa (SINEMET IR) 25-100 MG tablet Take by mouth.    . Ibuprofen-Diphenhydramine Cit (ADVIL PM PO) Take by mouth.    . metoprolol succinate (TOPROL-XL) 25 MG 24 hr tablet Take 1 tablet (25 mg total) by mouth daily. 30 tablet 5  . rosuvastatin (CRESTOR) 10 MG tablet Take 1 tablet (10 mg total) by mouth daily. 30 tablet 11   No facility-administered medications prior to visit.   Meds ordered this encounter  Medications  . amoxicillin (AMOXIL) 500 MG capsule    Sig: Take 1 capsule by mouth 3 (three) times daily.  . predniSONE (DELTASONE) 20 MG tablet    Sig: As directed           Objective:   Physical Exam   Vitals:  Filed Vitals:   11/15/15 1135  BP: 152/92  Pulse: 71  SpO2: 94%     Constitutional/General:  Pleasant, well-nourished, well-developed, not in any distress,  Comfortably seating.  Well kempt  There is no weight on file to calculate BMI. Wt Readings from Last 3 Encounters:  09/24/15 224 lb (101.606 kg)  08/12/15 224 lb 3 oz (101.691 kg)  06/22/15 223 lb (101.152 kg)    Neck circumference: 17 inches  HEENT: Pupils equal and reactive to light and accommodation. Anicteric sclerae. Normal nasal mucosa.   No oral  lesions,  mouth clear,  oropharynx clear, no postnasal drip. (-) Oral thrush. No dental caries.  Airway - Mallampati class II-IV  Neck: No masses. Midline trachea. No JVD, (-) LAD. (-) bruits appreciated.  Respiratory/Chest: Grossly normal chest. (-) deformity. (-) Accessory muscle use.  Symmetric expansion. (-) Tenderness on palpation.  Resonant on percussion.  Diminished BS on both lower lung zones. (-) wheezing, crackles, rhonchi (-) egophony  Cardiovascular: Regular rate and  rhythm, heart sounds normal, no murmur or gallops, no peripheral edema  Gastrointestinal:  Normal bowel sounds. Soft, non-tender. No hepatosplenomegaly.  (-) masses.   Musculoskeletal:  Normal muscle tone. Normal gait. (-) tremors noted.   Extremities: Grossly normal. (-) clubbing, cyanosis.  (-) edema  Skin: (-) rash,lesions seen.   Neurological/Psychiatric : alert, oriented to time, place, person. Normal mood and affect           Assessment & Plan:  Hypersomnia Recent hypersomnia, frequent awakenings, nocturia, unrefreshed sleep. Also has parkinsonism. Neck circ 17 in ESS 12  Plan : 1. Need HST. 2. If (-), no need to f/u. Advised on pt to observe muscle weakness with PD. 3. If (+), likely will want cpap only if significant.  He does not necessarily like cpap.    Obesity Weight reduction  Cough Recent cough and cold. H/O melanoma in remission. Plan for CXR. May need chest ct scan.   Parkinson's plus syndrome ([facility]) Stable on  sinemet. WOF worsening muscular weakness > may need vent later on. D/w pt.     Thank you very much for letting me participate in this patient's care. Please do not hesitate to give me a call if you have any questions or concerns regarding the treatment plan.   Patient will follow up with me after the home sleep test.     Monica Becton, MD 11/15/2015   2:39 PM Pulmonary and [place] Pager: [phone] Office: [phone], Fax: [phone]

## 2015-11-19 ENCOUNTER — Encounter: Payer: Self-pay | Admitting: Family Medicine

## 2015-11-19 ENCOUNTER — Encounter (HOSPITAL_COMMUNITY): Payer: Self-pay | Admitting: Emergency Medicine

## 2015-11-19 ENCOUNTER — Emergency Department (HOSPITAL_COMMUNITY)
Admission: EM | Admit: 2015-11-19 | Discharge: 2015-11-19 | Disposition: A | Discharge status: Home / Self Care | Payer: PPO | Attending: Emergency Medicine | Admitting: Emergency Medicine

## 2015-11-19 ENCOUNTER — Ambulatory Visit (INDEPENDENT_AMBULATORY_CARE_PROVIDER_SITE_OTHER): Payer: PPO | Admitting: Family Medicine

## 2015-11-19 VITALS — BP 118/76 | HR 80 | Temp 98.6°F | Wt 219.0 lb

## 2015-11-19 DIAGNOSIS — Z79899 Other long term (current) drug therapy: Secondary | ICD-10-CM | POA: Insufficient documentation

## 2015-11-19 DIAGNOSIS — Y998 Other external cause status: Secondary | ICD-10-CM | POA: Diagnosis not present

## 2015-11-19 DIAGNOSIS — E785 Hyperlipidemia, unspecified: Secondary | ICD-10-CM | POA: Diagnosis not present

## 2015-11-19 DIAGNOSIS — I10 Essential (primary) hypertension: Secondary | ICD-10-CM | POA: Diagnosis not present

## 2015-11-19 DIAGNOSIS — Z87891 Personal history of nicotine dependence: Secondary | ICD-10-CM | POA: Insufficient documentation

## 2015-11-19 DIAGNOSIS — L509 Urticaria, unspecified: Secondary | ICD-10-CM

## 2015-11-19 DIAGNOSIS — X58XXXA Exposure to other specified factors, initial encounter: Secondary | ICD-10-CM | POA: Insufficient documentation

## 2015-11-19 DIAGNOSIS — Z85828 Personal history of other malignant neoplasm of skin: Secondary | ICD-10-CM | POA: Diagnosis not present

## 2015-11-19 DIAGNOSIS — T783XXA Angioneurotic edema, initial encounter: Secondary | ICD-10-CM | POA: Insufficient documentation

## 2015-11-19 DIAGNOSIS — Y9389 Activity, other specified: Secondary | ICD-10-CM | POA: Diagnosis not present

## 2015-11-19 DIAGNOSIS — Y9289 Other specified places as the place of occurrence of the external cause: Secondary | ICD-10-CM | POA: Diagnosis not present

## 2015-11-19 MED ORDER — METHYLPREDNISOLONE ACETATE 80 MG/ML IJ SUSP
80.0000 mg | Freq: Once | INTRAMUSCULAR | Status: AC
  Administered 2015-11-19: 80 mg via INTRAMUSCULAR

## 2015-11-19 MED ORDER — DIPHENHYDRAMINE HCL 25 MG PO CAPS
25.0000 mg | ORAL_CAPSULE | Freq: Once | ORAL | Status: AC
  Administered 2015-11-19: 25 mg via ORAL
  Filled 2015-11-19: qty 1

## 2015-11-19 MED ORDER — FAMOTIDINE 20 MG PO TABS
20.0000 mg | ORAL_TABLET | Freq: Once | ORAL | Status: AC
  Administered 2015-11-19: 20 mg via ORAL
  Filled 2015-11-19: qty 1

## 2015-11-19 MED ORDER — PREDNISONE 10 MG PO TABS: 40.0000 mg | ORAL_TABLET | Freq: Every day | ORAL | Status: DC

## 2015-11-19 NOTE — ED Provider Notes (Signed)
CSN: [id number]     Arrival date & time 11/19/15  1414 History  By signing my name below, I, Soijett Blue, attest that this documentation has been prepared under the direction and in the presence of Shary Decamp, PA-C Electronically Signed: Soijett Blue, ED Scribe. 11/19/2015. 2:46 PM.   Chief Complaint  Patient presents with  . Urticaria   The history is provided by the patient. No language interpreter was used.   Nicholas [name] is a 70 y.o. male with a medical hx of HTN who presents to the Emergency Department complaining of urtticaria onset midnight. Pt notes that he woke up with hives at midnight and he thinks it is due to his recent amoxicillin Rx for bronchitis. Pt states he has been on amoxicillin before with no side effects. Pt denies taking ace inhibitors. Pt notes that he is able to tolerate fluids at this time. Pt was seen at his PCP office and was given an injection of prednisone for his symptoms and referred to the ED for further evaluation. Pt is having associated symptoms of upper and lower lip swelling. Pt denies throat closing, trouble breathing, fever, and any other symptoms.   Past Medical History  Diagnosis Date  . Hyperlipidemia   . Hypertension   . Cancer [facility]) Jan 2008    skin; hx of melanoma left foot/ amputation of toes 1&2    Past Surgical History  Procedure Laterality Date  . Removal of melanoma of left foot/amputation of toes 1&25 Jul 2006  . 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 Wrist fracture surgery      70 years old-set   Family History  Problem Relation Age of Onset  . Hypertension Father   . CVA Father     age 66  . Hyperlipidemia Father   . Other Mother     Deceased   Social History  Substance Use Topics  . Smoking status: Former Smoker -- 1.00 packs/day for 16 years    Types: Cigarettes    Quit date: 12/22/1993  . Smokeless tobacco: None  . Alcohol Use: 12.6 oz/week    21 Standard drinks or equivalent per week     Comment: Drink 1-2 glasses  of wine nightly    Review of Systems  Constitutional: Negative for fever.  HENT: Negative for trouble swallowing.        Upper and lower lip swelling   Allergies  Review of patient's allergies indicates no known allergies.  Home Medications   Prior to Admission medications   Medication Sig Start Date End Date Taking? Authorizing Provider  carbidopa-levodopa (SINEMET IR) 25-100 MG tablet Take by mouth. 06/24/15 06/23/16  Historical Provider, MD  Ibuprofen-Diphenhydramine Cit (ADVIL PM PO) Take by mouth. Reported on 11/19/2015    Historical Provider, MD  metoprolol succinate (TOPROL-XL) 25 MG 24 hr tablet Take 1 tablet (25 mg total) by mouth daily. 09/24/15   [name], MD  rosuvastatin (CRESTOR) 10 MG tablet Take 1 tablet (10 mg total) by mouth daily. 06/22/15   [name], MD   BP 122/80 mmHg  Pulse 75  Temp(Src) 97.9 F (36.6 C) (Oral)  Resp 18  SpO2 94%   Physical Exam  Constitutional: He is oriented to person, place, and time. He appears well-developed and well-nourished. No distress.  HENT:  Head: Normocephalic and atraumatic.  Mouth/Throat: Uvula is midline, oropharynx is clear and moist and mucous membranes are normal. No uvula swelling.  Swollen upper and lower lips.  No visual airway compromise.  Eyes: EOM are normal.  Neck: Neck supple.  Cardiovascular: Normal rate.   Pulmonary/Chest: Effort normal. No respiratory distress.  Abdominal: He exhibits no distension.  Musculoskeletal: Normal range of motion.  Neurological: He is alert and oriented to person, place, and time.  Skin: Skin is warm and dry. Rash noted. Rash is urticarial.  Urticarial rash to abdomen and LUE.   Psychiatric: He has a normal mood and affect. His behavior is normal.  Nursing note and vitals reviewed.   ED Course  Procedures (including critical care time) DIAGNOSTIC STUDIES: Oxygen Saturation is 94% on RA, adequate by my interpretation.    COORDINATION OF CARE: 2:45 PM Discussed  treatment plan with pt at bedside which includes pepcid and benadryl and pt agreed to plan.  Labs Review Labs Reviewed - No data to display  Imaging Review No results found.    EKG Interpretation None      MDM  I have reviewed the relevant previous healthcare records. I obtained HPI from historian. Patient discussed with supervising physician  ED Course:  Assessment: Pt is a 69yM who presents with urticarial rash on abdomen as well as lip swelling since 3AM. On exam, pt in NAD. Nontoxic/nonseptic appearing. VSS. Afebrile. Lungs CTA. Heart RRR. No visible oropharyngeal swelling. Airway intact. Given benadryl and pepcid in ED. Was given prednisone IM at PCP office. Watched patient in ED with improvement of symptoms. Plan is to [facility] with steroids. At time of discharge, Patient is in no acute distress. Vital Signs are stable. Patient is able to ambulate. Patient able to tolerate PO.    Disposition/Plan:  DC Home Additional Verbal discharge instructions given and discussed with patient.  Pt Instructed to f/u with PCP in the next week for evaluation and treatment of symptoms. Return precautions given Pt acknowledges and agrees with plan  Supervising Physician Nat Christen, MD   Final diagnoses:  Urticarial rash  Angioedema, initial encounter    I personally performed the services described in this documentation, which was scribed in my presence. The recorded information has been reviewed and is accurate.    Shary Decamp, PA-C 11/19/15 [place], MD 11/19/15 [phone]

## 2015-11-19 NOTE — Discharge Instructions (Signed)
Please read and follow all provided instructions.  Your diagnoses today include:  1. Urticarial rash   2. Angioedema, initial encounter    Tests performed today include:  Vital signs. See below for your results today.   Medications prescribed:   None  Home care instructions:  Follow any educational materials contained in this packet.  Follow-up instructions: Please follow-up with your primary care provider in the next week for further evaluation of symptoms and treatment   Return instructions:   Please return to the Emergency Department if you do not get better, if you get worse, or new symptoms OR  - Fever (temperature greater than 101.30F)  - Bleeding that does not stop with holding pressure to the area    -Severe pain (please note that you may be more sore the day after your accident)  - Chest Pain  - Difficulty breathing  - Severe nausea or vomiting  - Inability to tolerate food and liquids  - Passing out  - Skin becoming red around your wounds  - Change in mental status (confusion or lethargy)  - New numbness or weakness     Please return if you have any other emergent concerns.  Additional Information:  Your vital signs today were: BP 122/80 mmHg   Pulse 75   Temp(Src) 97.9 F (36.6 C) (Oral)   Resp 18   SpO2 94% If your blood pressure (BP) was elevated above 135/85 this visit, please have this repeated by your doctor within one month. ---------------

## 2015-11-19 NOTE — ED Notes (Signed)
Per patient states he woke up with hives in the middle of the night-states has been on amoxicillin and prednisone for Bronchitis-started meds on the 20th-saw PCP and sent here because "he couldn't tell if his throat was closing in the office"-no problems swallowing, no respiratory distress

## 2015-11-19 NOTE — Progress Notes (Signed)
Subjective:  Nicholas [name] is a 70 y.o. year old very pleasant male patient who presents for/with See problem oriented charting ROS- see ros below  Past Medical History-  Patient Active Problem List   Diagnosis Date Noted  . 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 Diastolic CHF ([place]) 123456    Priority: High  . MALIGNANT MELANOMA SKIN LOWER LIMB INCLUDING HIP 03/09/2009    Priority: High  . Chronic venous insufficiency 02/20/2014    Priority: Medium  . Hyperglycemia 08/02/2012    Priority: Medium  . Hyperlipidemia 12/13/2006    Priority: Medium  . Essential hypertension 12/13/2006    Priority: Medium  . Former smoker 04/29/2014    Priority: Low  . Obesity 07/13/2010    Priority: Low  . Insomnia 10/03/2007    Priority: Low  . Hypersomnia 11/15/2015  . Cough 11/15/2015  . Dizziness 12/30/2014    Medications- reviewed and updated Current Outpatient Prescriptions  Medication Sig Dispense Refill  . carbidopa-levodopa (SINEMET IR) 25-100 MG tablet Take 1.5 tablets by mouth 3 (three) times daily.     . metoprolol succinate (TOPROL-XL) 25 MG 24 hr tablet Take 1 tablet (25 mg total) by mouth daily. 30 tablet 5  . rosuvastatin (CRESTOR) 10 MG tablet Take 1 tablet (10 mg total) by mouth daily. 30 tablet 11  . Ibuprofen-Diphenhydramine Cit (ADVIL PM PO) Take 1 tablet by mouth at bedtime. Reported on 11/19/2015    . predniSONE (DELTASONE) 10 MG tablet Take 4 tablets (40 mg total) by mouth daily with breakfast. 10 tablet 0   No current facility-administered medications for this visit.    Objective: BP 118/76 mmHg  Pulse 80  Temp(Src) 98.6 F (37 C)  Wt 219 lb (99.338 kg)  SpO2 94% Gen: NAD, resting comfortably Taught lips due to swelling, mild tongue enlargement CV: RRR no murmurs rubs or gallops Lungs: CTAB no crackles, wheeze, rhonchi Abdomen: soft/nontender/nondistended/normal bowel sounds. No rebound or guarding.  Ext: no edema Skin: warm,  dry, hives throughout trunk and onto arms sparing back for most  part Neuro: grossly normal, moves all extremities, masked facies  Assessment/Plan:  Angioedema and hives S: Patient was on prednisone and amoxicillin for bronchitis. Prednisone ended on Tuesday. This morning he woke up with hives all over his body. He then developed lip swelling that has been progressing slightly. Some swelling of his tongue as well per wife. Has not taken anything for this. He does not have abdominal pain. He does have some fatigue ROS- fortunately no difficulty breathing or swallowing. No chest pain. Does have pruritis with hives A/P: Presumed angioedema due to amoxicillin that was being masked by prednisone. Gave a shot of depo medrol in office today then sent to the emergency room for monitoring given gradual worsening of symptoms. Reviewed ED note and was given pepcid and benadryl and sent home- he was stable by the time of arrival thankfully. My intent was for several hours monitoring but am thankful for stability of symptoms. I also called patient this morning. I also called patient on 11/11/15- HIs lip swelling has gone down but he does have some swelling in hands and feet- rash is better. He is having no difficulty breathing or swallowing. He has started prednisone given by ED as well. Has been drinking a lot of water to try to "flush" amoxicillin out and with his diastolic CHF advised against excess fluid intake.   Emergent Return precautions advised.   Meds ordered this  encounter  Medications  . methylPREDNISolone acetate (DEPO-MEDROL) injection 80 mg    Sig:    [name], MD

## 2015-11-20 NOTE — Patient Instructions (Signed)
Sent directly to [facility] [facility]

## 2015-12-06 DIAGNOSIS — G4733 Obstructive sleep apnea (adult) (pediatric): Secondary | ICD-10-CM | POA: Diagnosis not present

## 2015-12-09 ENCOUNTER — Telehealth: Payer: Self-pay | Admitting: Pulmonary Disease

## 2015-12-09 NOTE — Telephone Encounter (Signed)
  Please call the pt and tell the pt the [place]  showed OSA -- severe.   Pt stops breathing  30  times an hour.   Please order autoCPAP 5-15 cm H2O. Patient will need a mask fitting session. Patient will need a 1 month download.   Patient needs to be seen by me or any of the NPs/APPs  4-6 weeks after obtaining the cpap machine. Let me know if you receive this.   Thanks!   J. Shirl Harris, MD 12/09/2015, 4:24 PM

## 2015-12-10 ENCOUNTER — Other Ambulatory Visit: Payer: Self-pay | Admitting: *Deleted

## 2015-12-10 DIAGNOSIS — G4733 Obstructive sleep apnea (adult) (pediatric): Secondary | ICD-10-CM | POA: Diagnosis not present

## 2015-12-10 DIAGNOSIS — G4710 Hypersomnia, unspecified: Secondary | ICD-10-CM

## 2015-12-13 NOTE — Telephone Encounter (Signed)
LMTCB

## 2015-12-16 ENCOUNTER — Ambulatory Visit (INDEPENDENT_AMBULATORY_CARE_PROVIDER_SITE_OTHER): Payer: PPO | Admitting: Neurology

## 2015-12-16 ENCOUNTER — Encounter: Payer: Self-pay | Admitting: Neurology

## 2015-12-16 VITALS — BP 120/86 | HR 95 | Ht 67.0 in | Wt 218.6 lb

## 2015-12-16 DIAGNOSIS — G20 Parkinson's disease: Secondary | ICD-10-CM

## 2015-12-16 DIAGNOSIS — E519 Thiamine deficiency, unspecified: Secondary | ICD-10-CM

## 2015-12-16 NOTE — Progress Notes (Signed)
Follow-up Visit   Date: 12/16/2015   Nicholas [name] MRN: [id number] DOB: 12-31-1945   Interim History: Nicholas [name] is a 70 y.o. right-handed Caucasian male with hyperlipidemia, melanoma s/p toe resection, former smoker, venous insufficiency, and hypertension returning to the clinic for follow-up of parkinson disease.  The patient was accompanied to the clinic by wife who also provides collateral information.    History of present illness: Starting in 2015, he started experiencing intermittent spells of lightheadedness. There is no room spinning sensation. It is worse after he eats. He has tried using compression stocking which makes it worse. Laying down improves the symptoms. His PCP has tried to adjust his BP medications to eliminate this as a potential medication effect, but there has been no change.   He has numbness of the soles of the feet. He feels as if he is walking on balloons. His balance is fair, no falls and he walks independently. Denies headaches, vision problems, swallowing/talking, or limb weakness.   His voice has become softer over the past year and he has noticed that his hand writing is smaller and where he was previously able to write calligraphy, his penmanship is much poorer now. He endorses constipation. No RLS or vivid dreams.Sometimes if he has been sitting for a long period of time, he feels as if he is frozen and can't get up. It takes several seconds from him to get moving, but once it is walking, he is fine. No weakness.  UPDATE 04/09/2015:  He reports drinking 2-4 glasses of wine night and quit drinking about three weeks ago and has noticed improved lightheadedness.  He continues to have freezing spells, especially when initiating walking.  He has history of RLS.  UPDATE 08/12/2015:  At his last visit, I started ropinorole but he stopped this within 2-3 days because of dizziness.  His vitamin levels also disclosed vitamin B1 deficiency. He  sought a second opinion at [facility] with Dr. Nicki Reaper who diagnosed idiopathic parkinson's disease and started him on sinemet 25/100 titration scale.  He is tolerating sinemet 25/100 1 tablet three times daily and has noticed a marked difference in his walking and movements.  He has significantly reduced his alcohol and drinks wine maybe once per week.  He occasionally feels imbalanced especially with turning, but has not had any falls.  UPDATE 12/16/2015:  He was at follow-up appointment with Dr. Nicki Reaper at [facility] in February where his morning sinemet was increased to 1.5 tablets and he continues 1 tablet at noon and evening.  He has noticed that since doing this his blood pressure has been lower for him and he always has lightheadedness.  He has staying active and participates in the therapy once per week and goes involved in Parkinson's exercise program at [facility] twice per week.  No interval falls, or new complaints of RLS, vivid dreams or constipation.    Medications:  Current Outpatient Prescriptions on File Prior to Visit  Medication Sig Dispense Refill  . carbidopa-levodopa (SINEMET IR) 25-100 MG tablet Take 1.5 tablets by mouth 3 (three) times daily.     . Ibuprofen-Diphenhydramine Cit (ADVIL PM PO) Take 1 tablet by mouth at bedtime. Reported on 11/19/2015    . metoprolol succinate (TOPROL-XL) 25 MG 24 hr tablet Take 1 tablet (25 mg total) by mouth daily. 30 tablet 5  . rosuvastatin (CRESTOR) 10 MG tablet Take 1 tablet (10 mg total) by mouth daily. 30 tablet 11   No current facility-administered  medications on file prior to visit.    Allergies:  Allergies  Allergen Reactions  . Amoxicillin Swelling    Presumed angioedema of lips due to prednisone    Review of Systems:  CONSTITUTIONAL: No fevers, chills, night sweats, or weight loss.  EYES: No visual changes or eye pain ENT: No hearing changes.  No history of nose bleeds.   RESPIRATORY: No cough, wheezing and shortness of breath.     CARDIOVASCULAR: Negative for chest pain, and palpitations.   GI: Negative for abdominal discomfort, blood in stools or black stools.  No recent change in bowel habits.   GU:  No history of incontinence.   MUSCLOSKELETAL: No history of joint pain or swelling.  No myalgias.   SKIN: Negative for lesions, rash, and itching.   ENDOCRINE: Negative for cold or heat intolerance, polydipsia or goiter.   PSYCH:  No depression or anxiety symptoms.   NEURO: As Above.   Vital Signs:  BP 120/86 mmHg  Pulse 95  Ht 5\' 7"  (1.702 m)  Wt 218 lb 9 oz (99.139 kg)  BMI 34.22 kg/m2  SpO2 94%  Neurological Exam: MENTAL STATUS including orientation to time, place, person, recent and remote memory, attention span and concentration, language, and fund of knowledge is normal.  Speech is not dysarthric.  Severely blunted affect.  CRANIAL NERVES:  Pupils equal round and reactive to light.  Normal conjugate, extra-ocular eye movements in all directions of gaze.  No ptosis. Normal facial sensation.  Face is symmetric. Palate elevates symmetrically.  Tongue is midline.  MOTOR:  Motor strength is 5/5 in all extremities.  No pronator drift.  Mild cogwheel rigidity in the L > RUE.  Marked lymphedema bilaterally.  MSRs:  Reflexes are 2+/4 throughout, except absent Achilles bilaterally.  COORDINATION/GAIT:  Normal finger-to- nose-finger. Toe tapping on the left is slowed and shows reduced amplitude, otherwise finger tapping and right toe tapping is normal.  Gait is wide-based due to body habitus, normal cadence, speed, and good arm swing bilaterally.  Pull test negative.   Data: CTA head and neck 01/21/2015: 1. Mild atherosclerotic calcifications at the aortic arch and the left cavernous internal carotid artery without significant stenosis. 2. Tortuosity of the cervical internal carotid arteries bilaterally without significant stenosis. 3. 1.4 cm right thyroid nodule. Consider further evaluation with thyroid  ultrasound. If patient is clinically hyperthyroid, consider nuclear medicine thyroid uptake and scan.  Labs 04/09/2015:  Vitamin B12 310, vitamin B1 <7, TSH 0.83, copper 88  MRI brain wo contrast 05/21/2015: 1. Normal MRI of the brain for age. No acute or focal lesion to explain the patient's symptoms. 2. Mild diffuse mucosal disease throughout the paranasal sinuses.   IMPRESSION/PLAN: 1. Akinetic-rigid parkinson's disease, idiopathic. Exam shows blunted affect, pathological facial reflexes, increased tone in the upper extremities - Continue sinemet 25/100mg  1.5 tablet at 8:30am, 1 tab at 2:30am, and 1 tab at 6:30pm  - Encouraged to stay active and exercise at least 30-min 3-4 times per week  2. Thiamine deficiency due to alcoholism - resolved  - Encouraged him to limit alcohol intake  3.  Restless leg syndrome, improved with sinemet  4. Othostasis, lightheadedness - patient will discuss about reducing blood pressure medication with his PCP  Return to clinic in 3 months with Dr. Carles Collet   The duration of this appointment visit was 25 minutes of face-to-face time with the patient.  Greater than 50% of this time was spent in counseling, explanation of diagnosis, planning of further management,  and coordination of care.   Thank you for allowing me to participate in patient's care.  If I can answer any additional questions, I would be pleased to do so.    Sincerely,    Monty Spicher K. Posey Pronto, DO

## 2015-12-28 ENCOUNTER — Encounter: Payer: Self-pay | Admitting: Pulmonary Disease

## 2015-12-28 ENCOUNTER — Ambulatory Visit (INDEPENDENT_AMBULATORY_CARE_PROVIDER_SITE_OTHER): Payer: PPO | Admitting: Pulmonary Disease

## 2015-12-28 VITALS — BP 138/82 | HR 80 | Ht 67.0 in | Wt 220.0 lb

## 2015-12-28 DIAGNOSIS — G4733 Obstructive sleep apnea (adult) (pediatric): Secondary | ICD-10-CM | POA: Insufficient documentation

## 2015-12-28 DIAGNOSIS — R059 Cough, unspecified: Secondary | ICD-10-CM

## 2015-12-28 DIAGNOSIS — R05 Cough: Secondary | ICD-10-CM

## 2015-12-28 DIAGNOSIS — G4710 Hypersomnia, unspecified: Secondary | ICD-10-CM | POA: Diagnosis not present

## 2015-12-28 NOTE — Progress Notes (Signed)
Subjective:    Patient ID: Nicholas [name], male    DOB: October 10, 1945, 70 y.o.   MRN: [id number]  HPI   This is the case of Nicholas [name], 70 y.o. Male, who was referred by Dr. [name] in consultation regarding possible OSA.   As you very well know, patient  Has a 20 PY smoking history,quit 16 yrs.  Not been dxed with asthma or copd.   Frequent awakenings at night -- worse the last year. Has nocturia. Pt is snoring. Occasional gasping or choking.  Sleeeps 7-8 hrs/night. Wakes up refreshed.  Naps in pm -- 30 minutes.  (-) abnormal behavior in sleep.  Hypersomnia affects fxnality.   Dxed with parkinsonism  in 06/2015. Better with Sinemet.   ROV 12/28/2015 Pt returns to office as f/u on his HST.  This was supposed to be his cpap f/u but the HST was delayed. He had HST which showed severe OSA. We called up pt re: this but he did not call back. Mentioned to pt re: results.   No other issues since last seen.  Has not been admitted nor has been on abx since last seen. Has hypersomnia still.       Review of Systems  Constitutional: Negative.  Negative for fever and unexpected weight change.  HENT: Negative.  Negative for congestion, dental problem, ear pain, nosebleeds, postnasal drip, rhinorrhea, sinus pressure, sneezing, sore throat and trouble swallowing.   Eyes: Negative.  Negative for redness and itching.  Respiratory: Positive for cough and shortness of breath. Negative for chest tightness and wheezing.   Cardiovascular: Positive for palpitations and leg swelling.  Gastrointestinal: Negative.  Negative for nausea and vomiting.  Endocrine: Negative.   Genitourinary: Negative.  Negative for dysuria.  Musculoskeletal: Negative.  Negative for joint swelling.  Skin: Negative.  Negative for rash.  Allergic/Immunologic: Negative.   Neurological: Negative.  Negative for headaches.  Hematological: Bruises/bleeds easily.  Psychiatric/Behavioral: Negative.  Negative for dysphoric  mood. The patient is not nervous/anxious.       Objective:   Physical Exam   Vitals:  Filed Vitals:   12/28/15 1609  BP: 138/82  Pulse: 80  Height: 5\' 7"  (1.702 m)  Weight: 220 lb (99.791 kg)  SpO2: 94%    Constitutional/General:  Pleasant, well-nourished, well-developed, not in any distress,  Comfortably seating.  Well kempt  Body mass index is 34.45 kg/(m^2). Wt Readings from Last 3 Encounters:  12/28/15 220 lb (99.791 kg)  12/16/15 218 lb 9 oz (99.139 kg)  11/19/15 219 lb (99.338 kg)    Neck circumference: 17 inches  HEENT: Pupils equal and reactive to light and accommodation. Anicteric sclerae. Normal nasal mucosa.   No oral  lesions,  mouth clear,  oropharynx clear, no postnasal drip. (-) Oral thrush. No dental caries.  Airway - Mallampati class III-IV  Neck: No masses. Midline trachea. No JVD, (-) LAD. (-) bruits appreciated.  Respiratory/Chest: Grossly normal chest. (-) deformity. (-) Accessory muscle use.  Symmetric expansion. (-) Tenderness on palpation.  Resonant on percussion.  Diminished BS on both lower lung zones. (-) wheezing, crackles, rhonchi (-) egophony  Cardiovascular: Regular rate and  rhythm, heart sounds normal, no murmur or gallops, no peripheral edema  Gastrointestinal:  Normal bowel sounds. Soft, non-tender. No hepatosplenomegaly.  (-) masses.   Musculoskeletal:  Normal muscle tone. Normal gait. (-) tremors noted.   Extremities: Grossly normal. (-) clubbing, cyanosis.  (-) edema  Skin: (-) rash,lesions seen.   Neurological/Psychiatric : alert, oriented  to time, place, person. Normal mood and affect           Assessment & Plan:  Cough Recent cough and cold. H/O melanoma in remission. Plan for CXR. May need chest ct scan.     OSA (obstructive sleep apnea) Recent hypersomnia, frequent awakenings, nocturia, unrefreshed sleep. Also has parkinsonism. Neck circ 17 in ESS 12 HST (11/2015)  AHI 30.  Start autocpap 5-15 cm  water.  He may have issues with it. He does not necessarily look forward to using cpap.  Encouraged pt to try cpap as he has severe osa.     Hypersomnia Will observe hypersomnia with cpap.    Return to clinic in 2-3 months, sooner if with issues.       Monica Becton, MD 12/29/2015   4:17 AM Pulmonary and [place] Pager: [phone] Office: [phone], Fax: [phone]

## 2015-12-28 NOTE — Assessment & Plan Note (Addendum)
Recent hypersomnia, frequent awakenings, nocturia, unrefreshed sleep. Also has parkinsonism. Neck circ 17 in ESS 12 HST (11/2015)  AHI 30.  Start autocpap 5-15 cm water.  He may have issues with it. He does not necessarily look forward to using cpap.  Encouraged pt to try cpap as he has severe osa.

## 2015-12-28 NOTE — Assessment & Plan Note (Addendum)
Recent cough and cold. H/O melanoma in remission. Plan for CXR. May need chest ct scan.

## 2015-12-28 NOTE — Patient Instructions (Signed)
We extensively discussed the diagnosis, pathophysiology, and treatment options for Obstructive Sleep Apnea (OSA).  We discussed treatment options for OSA including CPAP, BiPaP, as well as surgical options and oral devices.    We will start patient on autocpap 5-15 cm H2O.   Patient was instructed to call the office if he/she has not received the PAP device in 1-2 weeks.  Patient was instructed to have mask, tubings, filter, reservoir cleaned at least once a week with soapy water.  Patient was instructed to call the office if he/she is having issues with the PAP device.   Return to clinic in 6-8 weeks with Dr. Corrie Dandy or with a NP.

## 2015-12-28 NOTE — Telephone Encounter (Signed)
Pt seen in office today. Results reviewed.

## 2015-12-29 NOTE — Assessment & Plan Note (Signed)
Will observe hypersomnia with cpap.

## 2016-01-04 ENCOUNTER — Telehealth: Payer: Self-pay | Admitting: Pulmonary Disease

## 2016-01-04 NOTE — Telephone Encounter (Signed)
Spoke with pt's spouse  She has not heard from [facility] on CPAP start  I called [facility] and was on hold for 10 minutes  Called Melissa and she states that they do have the order, and they will call him as soon as possible to get him set up  I spoke with the spouse again and notified her of this  Nothing further needed

## 2016-01-11 DIAGNOSIS — G4733 Obstructive sleep apnea (adult) (pediatric): Secondary | ICD-10-CM | POA: Diagnosis not present

## 2016-01-20 DIAGNOSIS — L918 Other hypertrophic disorders of the skin: Secondary | ICD-10-CM | POA: Diagnosis not present

## 2016-01-20 DIAGNOSIS — B353 Tinea pedis: Secondary | ICD-10-CM | POA: Diagnosis not present

## 2016-01-20 DIAGNOSIS — D692 Other nonthrombocytopenic purpura: Secondary | ICD-10-CM | POA: Diagnosis not present

## 2016-01-20 DIAGNOSIS — L812 Freckles: Secondary | ICD-10-CM | POA: Diagnosis not present

## 2016-01-20 DIAGNOSIS — Z85828 Personal history of other malignant neoplasm of skin: Secondary | ICD-10-CM | POA: Diagnosis not present

## 2016-02-10 DIAGNOSIS — G4733 Obstructive sleep apnea (adult) (pediatric): Secondary | ICD-10-CM | POA: Diagnosis not present

## 2016-02-25 NOTE — Progress Notes (Signed)
Nicholas [name] was seen today in the movement disorders clinic for neurologic consultation at the request of [name].  The consultation is for the evaluation of [name] disease.  I have reviewed records both from [name] as welll as from [name].  The patient is accompanied by his wife who supplements the history.  The patient first presented to [name] in June, 2016 with lightheadedness and dizziness.  He reported that that symptom had been present since 2015.  [name] noted that the patient looked parkinsonian.  In September, 2016 she started him on ropinirole, 0.25 mg twice a day but he stopped the medication after only a few days because of worsening of dizziness.  He saw Dr. Nicki Reaper in December, 2016 for a second opinion and Dr. Nicki Reaper felt that he had idiopathic Parkinson's disease and started him on levodopa.  He last saw Dr. Nicki Reaper in February, 2016 and he increased the patient's carbidopa/levodopa 25/100-1-1/2 tablets in the morning, one tablet in the afternoon and one tablet in the evening.  The patient is still on this dosage.  The patient had an MRI of the brain that I had the opportunity to review.  This was done without gadolinium in October, 2016.  There were only a few scattered T2 hyperintensities and it was otherwise unremarkable.   Specific Symptoms:  Tremor: No. Family hx of similar:  No. Voice: hypophonic (did LSVT LOUD in Jan) Sleep: sleeps well with CPAP  Vivid Dreams:  Yes.    Acting out dreams:  No. Wet Pillows: No. (rarely) Postural symptoms:  Yes.   (much better with therapy)  Falls?  No. Bradykinesia symptoms: difficulty getting out of a chair Loss of smell:  No. Loss of taste:  No. Urinary Incontinence:  No. Difficulty Swallowing:  Yes.   rarely with solids Handwriting, micrographia: Yes.   (R hand dominant) Trouble with ADL's:  Yes.   (slower)  Trouble buttoning clothing: Yes.   Depression:  Yes.  per wife (pt states that just  frustrated) Memory changes:  No. Hallucinations:  No.  visual distortions: No. N/V:  Yes.   (only in AM, slight nausea) Lightheaded:  Yes.    Syncope: No. Diplopia:  No. Dyskinesia:  No.  PREVIOUS MEDICATIONS: Sinemet and requip (only few days and was dizzy and was d/c)  ALLERGIES:   Allergies  Allergen Reactions  . Amoxicillin Swelling    Presumed angioedema of lips due to prednisone    CURRENT MEDICATIONS:  Outpatient Encounter Prescriptions as of 02/29/2016  Medication Sig  . carbidopa-levodopa (SINEMET IR) 25-100 MG tablet Take 1.5 tablets by mouth See admin instructions. PT TAKES 1.5 TAB IN MORNING, 1 TAB AT NOON, AND 1 TAB IN EVENING  . Ibuprofen-Diphenhydramine Cit (ADVIL PM PO) Take 1 tablet by mouth at bedtime. Reported on 11/19/2015  . metoprolol succinate (TOPROL-XL) 25 MG 24 hr tablet Take 1 tablet (25 mg total) by mouth daily.  . rosuvastatin (CRESTOR) 10 MG tablet Take 1 tablet (10 mg total) by mouth daily.   No facility-administered encounter medications on file as of 02/29/2016.     PAST MEDICAL HISTORY:   Past Medical History:  Diagnosis Date  . Cancer [name]) Jan 2008   skin; hx of melanoma left foot/ amputation of toes 1&2   . Hyperlipidemia   . Hypertension   . Lymphedema     PAST SURGICAL HISTORY:   Past Surgical History:  Procedure Laterality Date  . 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 removal of melanoma of left foot/amputation of toes 1&25 Jul 2006  . WRIST FRACTURE SURGERY     70 years old-set    SOCIAL HISTORY:   Social History   Social History  . Marital status: Married    Spouse name: N/A  . Number of children: N/A  . Years of education: N/A   Occupational History  . retired     Engineer, maintenance (IT)   Social History Main Topics  . Smoking status: Former Smoker    Packs/day: 1.00    Years: 16.00    Types: Cigarettes    Quit date: 12/22/1993  . Smokeless tobacco: Never Used  . Alcohol use 12.6 oz/week    21 Standard drinks or equivalent per week      Comment: one time every few months now  . Drug use: No  . Sexual activity: Not on file   Other Topics Concern  . Not on file   Social History Narrative   Married 1981. Wife has kids-1 with 1 adopted grandchild.       Retired Tax adviser      Highest level of education:  B.S.      Hobbies: antiques, former Air cabin crew      Exercise: none currently.     FAMILY HISTORY:   Family Status  Relation Status  . Father Deceased at age 85  . Mother Deceased at age 94  . Sister Alive  . Brother Alive    ROS:  A complete 10 system review of systems was obtained and was unremarkable apart from what is mentioned above.  PHYSICAL EXAMINATION:    VITALS:   Vitals:   02/29/16 1256  BP: 120/80  Pulse: 78  Weight: 223 lb (101.2 kg)  Height: 5\' 7"  (1.702 m)    GEN:  The patient appears stated age and is in NAD. HEENT:  Normocephalic, atraumatic.  The mucous membranes are moist. The superficial temporal arteries are without ropiness or tenderness. CV:  RRR Lungs:  CTAB Neck/HEME:  There are no carotid bruits bilaterally.  Neurological examination:  Orientation: The patient is alert and oriented x3. Fund of knowledge is appropriate.  Recent and remote memory are intact.  Attention and concentration are normal.    Able to name objects and repeat phrases. Cranial nerves: There is good facial symmetry. Pupils are equal round and reactive to light bilaterally. Fundoscopic exam reveals clear margins bilaterally. Extraocular muscles are intact. The visual fields are full to confrontational testing. The speech is fluent and clear. Soft palate rises symmetrically and there is no tongue deviation. Hearing is intact to conversational tone. Sensation: Sensation is intact to light and pinprick throughout (facial, trunk, extremities). Vibration is intact at the bilateral big toe. There is no extinction with double simultaneous stimulation. There is no sensory dermatomal level identified. Motor:  Strength is 5/5 in the bilateral upper and lower extremities.   Shoulder shrug is equal and symmetric.  There is no pronator drift. Deep tendon reflexes: Deep tendon reflexes are 2-/4 at the bilateral biceps, triceps, brachioradialis, 2/4 at the bilateral patella and achilles. There is a striatal toe on the L.    Movement examination: Tone: There is increased tone in the RUE, mild-mod.  There is normal tone in the LUE.  The tone in the lower extremities is normal.  Abnormal movements: There is none Coordination:  There is decremation with RAM's, seen only with heel taps on the left.   Gait and Station: The patient has no  difficulty arising out of a deep-seated chair without the use of the hands. The patient's stride length is normal.  The patient has a negative pull test.      ASSESSMENT/PLAN:  1.  Parkinsonism.  I suspect that this does represent idiopathic Parkinson's disease but I do think that PSP could be on the Ddx as he does have a fixed face stare/eyes.  However, he really otherwise looked very good and he had even missed his middle of the day dose of meds.    -We discussed the diagnosis as well as pathophysiology of the disease.  We discussed treatment options as well as prognostic indicators.  Patient education was provided.  -Greater than 50% of the 60 minute visit was spent in counseling answering questions and talking about what to expect now as well as in the future.    -continue carbidopa/levodopa 25/100, 1.5 tablets/1/1  -We discussed community resources in the area including patient support groups and community exercise programs for PD and pt education was provided to the patient.   Pt is enrolled in [employer] and the [employer] cycling class. 2.  History of melanoma  -I had a long discussion with the patient regarding the fact that we used to believe that levodopa increased once risk for melanoma, but it is now believed that patients who have Parkinson's disease have an increased risk  for melanoma.  He should continue to get his yearly skin checks.  Just had last one done a month ago 3.  Vasomotor rhinorrhea  -mild and he is not ready for meds 4.  HTN  -asked me about going off BP medication but once I reviewed his trending BP, I told him that I would stay on it for now.  He tried to d/c it on own and it went way up.   5.  Follow up is anticipated in the next few months, sooner should new neurologic issues arise.

## 2016-02-29 ENCOUNTER — Ambulatory Visit (INDEPENDENT_AMBULATORY_CARE_PROVIDER_SITE_OTHER): Payer: PPO | Admitting: Neurology

## 2016-02-29 ENCOUNTER — Encounter: Payer: Self-pay | Admitting: Neurology

## 2016-02-29 VITALS — BP 120/80 | HR 78 | Ht 67.0 in | Wt 223.0 lb

## 2016-02-29 DIAGNOSIS — J3489 Other specified disorders of nose and nasal sinuses: Secondary | ICD-10-CM

## 2016-02-29 DIAGNOSIS — G20 Parkinson's disease: Secondary | ICD-10-CM | POA: Diagnosis not present

## 2016-02-29 DIAGNOSIS — Z85820 Personal history of malignant melanoma of skin: Secondary | ICD-10-CM

## 2016-03-12 DIAGNOSIS — G4733 Obstructive sleep apnea (adult) (pediatric): Secondary | ICD-10-CM | POA: Diagnosis not present

## 2016-03-16 ENCOUNTER — Encounter: Payer: Self-pay | Admitting: Adult Health

## 2016-03-20 ENCOUNTER — Ambulatory Visit: Payer: PPO | Admitting: Pulmonary Disease

## 2016-03-20 ENCOUNTER — Encounter: Payer: Self-pay | Admitting: Adult Health

## 2016-03-20 ENCOUNTER — Ambulatory Visit (INDEPENDENT_AMBULATORY_CARE_PROVIDER_SITE_OTHER): Payer: PPO | Admitting: Adult Health

## 2016-03-20 DIAGNOSIS — G4733 Obstructive sleep apnea (adult) (pediatric): Secondary | ICD-10-CM

## 2016-03-20 NOTE — Progress Notes (Signed)
Subjective:    Patient ID: Nicholas [name], male    DOB: Sep 08, 1945, 70 y.o.   MRN: [id number]  HPI  70 year old male followed for severe obstructive sleep apnea  TEST  HST (11/2015)  AHI 30.   03/20/2016 Follow up : OSA  Patient returns for a 2 month follow up.  Was seen last visit and found to have severe obstructive sleep apnea on home sleep study with AHI 30. He was started on C Pap at bedtime. Since last visit . He says he is doing well on C Pap. Download shows compliance with average. Uses around 8 hours. AHI 1.9. He is on AutoSet 5-15 cm of H2O. Minimum leaks. Says he loves his machine. Feels better. ,More rested.    Past Medical History:  Diagnosis Date  . 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 Hyperlipidemia   . Hypertension   . Lymphedema    Current Outpatient Prescriptions on File Prior to Visit  Medication Sig Dispense Refill  . carbidopa-levodopa (SINEMET IR) 25-100 MG tablet Take 1.5 tablets by mouth See admin instructions. PT TAKES 1.5 TAB IN MORNING, 1 TAB AT NOON, AND 1 TAB IN EVENING    . Ibuprofen-Diphenhydramine Cit (ADVIL PM PO) Take 1 tablet by mouth at bedtime. Reported on 11/19/2015    . metoprolol succinate (TOPROL-XL) 25 MG 24 hr tablet Take 1 tablet (25 mg total) by mouth daily. 30 tablet 5  . rosuvastatin (CRESTOR) 10 MG tablet Take 1 tablet (10 mg total) by mouth daily. 30 tablet 11   No current facility-administered medications on file prior to visit.       Review of Systems Constitutional:   No  weight loss, night sweats,  Fevers, chills, fatigue, or  lassitude.  HEENT:   No headaches,  Difficulty swallowing,  Tooth/dental problems, or  Sore throat,                No sneezing, itching, ear ache, nasal congestion, post nasal drip,   CV:  No chest pain,  Orthopnea, PND, swelling in lower extremities, anasarca, dizziness, palpitations, syncope.   GI  No heartburn, indigestion, abdominal pain, nausea,  vomiting, diarrhea, change in bowel habits, loss of appetite, bloody stools.   Resp: No shortness of breath with exertion or at rest.  No excess mucus, no productive cough,  No non-productive cough,  No coughing up of blood.  No change in color of mucus.  No wheezing.  No chest wall deformity  Skin: no rash or lesions.  GU: no dysuria, change in color of urine, no urgency or frequency.  No flank pain, no hematuria   MS:  No joint pain or swelling.  No decreased range of motion.  No back pain.  Psych:  No change in mood or affect. No depression or anxiety.  No memory loss.         Objective:   Physical Exam Vitals:   03/20/16 1511  BP: 114/76  Pulse: 86  Temp: 97.9 F (36.6 C)  TempSrc: Oral  SpO2: 95%  Weight: 226 lb (102.5 kg)  Height: 5\' 7"  (1.702 m)  Body mass index is 35.4 kg/m.   GEN: A/Ox3; pleasant , NAD, well nourished    HEENT:  [place]/AT,  EACs-clear, TMs-wnl, NOSE-clear, THROAT-clear, no lesions, no postnasal drip or exudate noted. Class 2-3 MP airway   NECK:  Supple w/ fair ROM; no JVD; normal carotid impulses w/o bruits;  no thyromegaly or nodules palpated; no lymphadenopathy.    RESP  Clear  P & A; w/o, wheezes/ rales/ or rhonchi. no accessory muscle use, no dullness to percussion  CARD:  RRR, no m/r/g  , 1-2  peripheral edema, pulses intact, no cyanosis or clubbing.  GI:   Soft & nt; nml bowel sounds; no organomegaly or masses detected.   Musco: Warm bil, no deformities or joint swelling noted.   Neuro: alert, no focal deficits noted.    Skin: Warm, no lesions or rashes  Tammy Parrett NP-C  [facility]  03/20/2016        Assessment & Plan:

## 2016-03-20 NOTE — Patient Instructions (Signed)
Great job, Keep up good work .  Wear CPAP At bedtime  .  Work on weight loss.  Do not drive if sleepy  follow up Dr. Corrie Dandy in 4-6 months and As needed

## 2016-03-20 NOTE — Assessment & Plan Note (Signed)
Severe sleep apnea, well controlled on C Pap.   Plan  Patient Instructions  Doristine Devoid job, Keep up good work .  Wear CPAP At bedtime  .  Work on weight loss.  Do not drive if sleepy  follow up Dr. Corrie Dandy in 4-6 months and As needed

## 2016-03-28 ENCOUNTER — Other Ambulatory Visit: Payer: Self-pay | Admitting: Family Medicine

## 2016-04-12 DIAGNOSIS — G4733 Obstructive sleep apnea (adult) (pediatric): Secondary | ICD-10-CM | POA: Diagnosis not present

## 2016-04-17 DIAGNOSIS — G4733 Obstructive sleep apnea (adult) (pediatric): Secondary | ICD-10-CM | POA: Diagnosis not present

## 2016-04-18 ENCOUNTER — Ambulatory Visit: Payer: PPO | Attending: Neurology | Admitting: Physical Therapy

## 2016-04-18 ENCOUNTER — Ambulatory Visit: Payer: PPO

## 2016-04-18 ENCOUNTER — Ambulatory Visit: Payer: PPO | Admitting: Occupational Therapy

## 2016-04-18 DIAGNOSIS — R29818 Other symptoms and signs involving the nervous system: Secondary | ICD-10-CM

## 2016-04-18 DIAGNOSIS — R471 Dysarthria and anarthria: Secondary | ICD-10-CM

## 2016-04-18 DIAGNOSIS — R2689 Other abnormalities of gait and mobility: Secondary | ICD-10-CM | POA: Insufficient documentation

## 2016-04-18 NOTE — Therapy (Signed)
[facility] [address] [place], [place], [address] Phone: [phone]   Fax:  [phone]  Patient Details  Name: Nicholas [name] MRN: [id number] Date of Birth: 19-Mar-1946 Referring Provider:  [name], MD  Encounter Date: 04/18/2016   Occupational Therapy Parkinson's Disease Screen  Physical Performance Test item #4 (donning/doffing jacket):  9.75 sec  9-hole peg test:    RUE  24.59 sec   (R-handed)     LUE  30.53 sec  Fastening/unfastening 3 buttons:  26.06sec  Change in ability to perform ADLs/IADLs:  no  Other Comments:  Pt participating in cycling class, but not doing therapy HEP.  Encouraged pt to resume therapy HEP.  Pt does not require occupation therapy services at this time.  Recommended occupational therapy screen in   approx 6 months    [facility] 04/18/2016, 1:22 PM  [facility] [address] [place], [place], [address] Phone: [phone]   Fax:  [place], OTR/L [facility] [address] [place], [place]  [address] [phone] phone [phone] 04/18/16 1:22 PM

## 2016-04-18 NOTE — Therapy (Signed)
[facility] [address] [place], [place], [address] Phone: [phone]   Fax:  [phone]  Patient Details  Name: Nicholas [name] MRN: [id number] Date of Birth: [birth year] Referring Provider: Narda Amber, MD  Encounter Date: 04/18/2016  Speech Therapy Parkinson's Disease Screen   Decibel Level today: 70dB  (WNL=70-72 dB) with sound level meter 30cm away from pt's mouth. Pt's conversational volume has remained essentially the same since last treatment course.  Pt reports he has not experienced difficulty in swallowing warranting objective evaluation.  Pt does does not require speech therapy services at this time. Recommend ST screen in another 6 months    [facility] ,MS, CCC-SLP  04/18/2016, 1:52 PM  [facility] [address] [place], [place], [address] Phone: [phone]   Fax:  [phone]

## 2016-04-18 NOTE — Therapy (Signed)
[facility] [address] [place], [place], [address] Phone: [phone]   Fax:  [phone]  Patient Details  Name: Nicholas [name] MRN: [id number] Date of Birth: 03/23/1946 Referring Provider:  [name], MD  Encounter Date: 04/18/2016   Physical Therapy Parkinson's Disease Screen   Timed Up and Go test: 12.74  sec  10 meter walk test:10.50 sec (3.12 ft/sec)  5 time sit to stand test:10.14 sec    Patient does not require Physical Therapy services at this time.  Recommend Physical Therapy screen in 6 months.   Elmo Rio W. 04/18/2016, 1:50 PM  [name]., PT  [facility] [address] [place], [place], [address] Phone: [phone]   Fax:  [phone]

## 2016-04-19 ENCOUNTER — Telehealth: Payer: Self-pay | Admitting: Oncology

## 2016-04-19 NOTE — Telephone Encounter (Signed)
CALL SWITCH. PER BS MOVED FU FROM 10/12 TO 10/11. SPOKE WITH PATIENT HE IS AWARE.

## 2016-05-03 ENCOUNTER — Encounter: Payer: Self-pay | Admitting: Family Medicine

## 2016-05-03 ENCOUNTER — Ambulatory Visit (HOSPITAL_BASED_OUTPATIENT_CLINIC_OR_DEPARTMENT_OTHER): Payer: PPO | Admitting: Oncology

## 2016-05-03 ENCOUNTER — Telehealth: Payer: Self-pay | Admitting: Oncology

## 2016-05-03 ENCOUNTER — Ambulatory Visit (INDEPENDENT_AMBULATORY_CARE_PROVIDER_SITE_OTHER): Payer: PPO | Admitting: Family Medicine

## 2016-05-03 VITALS — BP 120/70 | HR 80 | Temp 98.2°F | Wt 227.4 lb

## 2016-05-03 VITALS — BP 133/82 | HR 74 | Temp 98.1°F | Resp 18 | Ht 67.0 in | Wt 227.0 lb

## 2016-05-03 DIAGNOSIS — I10 Essential (primary) hypertension: Secondary | ICD-10-CM

## 2016-05-03 DIAGNOSIS — E785 Hyperlipidemia, unspecified: Secondary | ICD-10-CM | POA: Diagnosis not present

## 2016-05-03 DIAGNOSIS — Z23 Encounter for immunization: Secondary | ICD-10-CM | POA: Diagnosis not present

## 2016-05-03 DIAGNOSIS — D692 Other nonthrombocytopenic purpura: Secondary | ICD-10-CM

## 2016-05-03 DIAGNOSIS — R739 Hyperglycemia, unspecified: Secondary | ICD-10-CM

## 2016-05-03 DIAGNOSIS — G4733 Obstructive sleep apnea (adult) (pediatric): Secondary | ICD-10-CM

## 2016-05-03 DIAGNOSIS — G20 Parkinson's disease: Secondary | ICD-10-CM | POA: Diagnosis not present

## 2016-05-03 DIAGNOSIS — Z85820 Personal history of malignant melanoma of skin: Secondary | ICD-10-CM

## 2016-05-03 DIAGNOSIS — R609 Edema, unspecified: Secondary | ICD-10-CM | POA: Diagnosis not present

## 2016-05-03 DIAGNOSIS — C439 Malignant melanoma of skin, unspecified: Secondary | ICD-10-CM

## 2016-05-03 LAB — HEMOGLOBIN A1C: Hgb A1c MFr Bld: 5.7 % (ref 4.6–6.5)

## 2016-05-03 LAB — COMPREHENSIVE METABOLIC PANEL
ALT: 5 U/L (ref 0–53)
AST: 13 U/L (ref 0–37)
Albumin: 3.9 g/dL (ref 3.5–5.2)
Alkaline Phosphatase: 65 U/L (ref 39–117)
BUN: 23 mg/dL (ref 6–23)
CO2: 27 meq/L (ref 19–32)
Calcium: 9 mg/dL (ref 8.4–10.5)
Chloride: 105 mEq/L (ref 96–112)
Creatinine, Ser: 0.83 mg/dL (ref 0.40–1.50)
GFR: 97.33 mL/min (ref >60.00)
Glucose, Bld: 92 mg/dL (ref 70–99)
POTASSIUM: 4 meq/L (ref 3.5–5.1)
Sodium: 140 mEq/L (ref 135–145)
TOTAL PROTEIN: 6.4 g/dL (ref 6.0–8.3)
Total Bilirubin: 0.4 mg/dL (ref 0.2–1.2)

## 2016-05-03 LAB — CBC
HEMATOCRIT: 43.9 % (ref 39.0–52.0)
HEMOGLOBIN: 14.8 g/dL (ref 13.0–17.0)
MCHC: 33.7 g/dL (ref 30.0–36.0)
MCV: 90.5 fl (ref 78.0–100.0)
Platelets: 163 10*3/uL (ref 150.0–400.0)
RBC: 4.85 Mil/uL (ref 4.22–5.81)
RDW: 13.7 % (ref 11.5–15.5)
WBC: 6.3 10*3/uL (ref 4.0–10.5)

## 2016-05-03 LAB — LDL CHOLESTEROL, DIRECT: Direct LDL: 50 mg/dL

## 2016-05-03 NOTE — Assessment & Plan Note (Signed)
S:  BP has been much better since he got on CPAP- in our office 120/70. He wants to try off metoprolol BP Readings from Last 3 Encounters:  05/03/16 133/82  05/03/16 120/70  03/20/16 114/76  A/P: 2nd reading today was at oncology. We did agree to trial off for 1-2 weeks and then return visit and if elevated restart- I am hopeful <140 by follo wup

## 2016-05-03 NOTE — Progress Notes (Signed)
Pre visit review using our clinic review tool, if applicable. No additional management support is needed unless otherwise documented below in the visit note. 

## 2016-05-03 NOTE — Assessment & Plan Note (Signed)
S: complains of Easy bruising on forearms and hands even with minimal trauma. He asks if he could have a platelet issue A/P:CBC reassuring- discussed little can be done

## 2016-05-03 NOTE — Progress Notes (Signed)
Subjective:  Nicholas [name] is a 70 y.o. year old very pleasant male patient who presents for/with See problem oriented charting ROS- No chest pain or shortness of breath. No headache or blurry vision. Denies orthostati csymptoms at present.see any ROS included in HPI as well.   Past Medical History-  Patient Active Problem List   Diagnosis Date Noted  . 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 Diastolic CHF ([place]) 123456    Priority: High  . MALIGNANT MELANOMA SKIN LOWER LIMB INCLUDING HIP 03/09/2009    Priority: High  . OSA (obstructive sleep apnea) 12/28/2015    Priority: Medium  . Chronic venous insufficiency 02/20/2014    Priority: Medium  . Hyperglycemia 08/02/2012    Priority: Medium  . Hyperlipidemia 12/13/2006    Priority: Medium  . Essential hypertension 12/13/2006    Priority: Medium  . Senile purpura ([place]) 05/03/2016    Priority: Low  . Hypersomnia 11/15/2015    Priority: Low  . Former smoker 04/29/2014    Priority: Low  . Obesity 07/13/2010    Priority: Low  . Insomnia 10/03/2007    Priority: Low  . Cough 11/15/2015  . Dizziness 12/30/2014    Medications- reviewed and updated Current Outpatient Prescriptions  Medication Sig Dispense Refill  . carbidopa-levodopa (SINEMET IR) 25-100 MG tablet Take 1.5 tablets by mouth See admin instructions. PT TAKES 1.5 TAB IN MORNING, 1 TAB AT NOON, AND 1 TAB IN EVENING    . Ibuprofen-Diphenhydramine Cit (ADVIL PM PO) Take 1 tablet by mouth at bedtime. Reported on 11/19/2015    . rosuvastatin (CRESTOR) 10 MG tablet Take 1 tablet (10 mg total) by mouth daily. 30 tablet 11  Metoprolol 25 mg XL daily No current facility-administered medications for this visit.     Objective: BP 120/70 (BP Location: Left Arm, Patient Position: Sitting, Cuff Size: Large)   Pulse 80   Temp 98.2 F (36.8 C) (Oral)   Wt 227 lb 6.4 oz (103.1 kg)   SpO2 95%   BMI 35.62 kg/m  Gen: NAD, resting comfortably, masked  facies CV: RRR no murmurs rubs or gallops Lungs: CTAB no crackles, wheeze, rhonchi Abdomen: soft/nontender/nondistended/normal bowel sounds. obese Ext: 2-3+ edema bilaterally Skin: warm, dry Neuro: grossly normal, moves all extremities, slow shuffling gait  Assessment/Plan:  Essential hypertension S:  BP has been much better since he got on CPAP- in our office 120/70. He wants to try off metoprolol BP Readings from Last 3 Encounters:  05/03/16 133/82  05/03/16 120/70  03/20/16 114/76  A/P: 2nd reading today was at oncology. We did agree to trial off for 1-2 weeks and then return visit and if elevated restart- I am hopeful <140 by follo wup   OSA (obstructive sleep apnea) S: suspect controlled on crestor 10mg  A/P: LDL 50 today at goal- continue current medicines   Senile purpura ([facility]) S: complains of Easy bruising on forearms and hands even with minimal trauma. He asks if he could have a platelet issue A/P:CBC reassuring- discussed little can be done   Hyperglycemia S: not significantly losing weight but a1c stable to better at 5.7 down from 5.8, suspect exercise contributing from parkinsons class Lab Results  Component Value Date   HGBA1C 5.7 05/03/2016  A/P: continue exercise, weight loss would be great but motivation not present other than to do class  2 weeks  Orders Placed This Encounter  Procedures  . Flu vaccine HIGH DOSE PF  . CBC  [place]  . Comprehensive metabolic panel    [place]  . Hemoglobin A1c    [place]  . LDL cholesterol, direct    [place]   Return precautions advised.  [name], MD

## 2016-05-03 NOTE — Assessment & Plan Note (Signed)
S: suspect controlled on crestor 10mg  A/P: LDL 50 today at goal- continue current medicines

## 2016-05-03 NOTE — Patient Instructions (Signed)
Stop metoprolol. Follow up before the end of the month on a date that works for you to recheck blood pressure.   Labs before you leave

## 2016-05-03 NOTE — Assessment & Plan Note (Signed)
S: not significantly losing weight but a1c stable to better at 5.7 down from 5.8, suspect exercise contributing from parkinsons class Lab Results  Component Value Date   HGBA1C 5.7 05/03/2016  A/P: continue exercise, weight loss would be great but motivation not present other than to do class

## 2016-05-03 NOTE — Progress Notes (Signed)
  [facility] OFFICE PROGRESS NOTE   Diagnosis: Melanoma  INTERVAL HISTORY:   Nicholas [name] returns as scheduled. He feels well. He has stable edema and both lower legs. He has been diagnosed with parkinsonism and is followed by Dr.[name]. Medical therapy has helped. He continues follow-up with dermatology.  Objective:  Vital signs in last 24 hours:  Blood pressure 133/82, pulse 74, temperature 98.1 F (36.7 C), temperature source Oral, resp. rate 18, height 5\' 7"  (1.702 m), weight 227 lb (103 kg), SpO2 97 %.    HEENT: Neck without mass Lymphatics: No cervical, supra-clavicular, axillary, inguinal, or femoral nodes Resp: Lungs clear bilaterally Cardio: Regular rate and rhythm GI: No hepatosplenic the, nontender, no mass Vascular: 2+ edema at the lower leg and foot bilaterally  Skin: Left thigh scar and left toe and dictation sites without evidence of recurrent tumor. Slightly raised clear 2-3 mm lesions at the bilateral temporal area   Medications: I have reviewed the patient's current medications.  Assessment/Plan: 1. 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 2. 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 3. Left leg edema, stable. He uses a compression stocking intermittently. 4. History of dizziness, anorexia, and weight loss secondary to interferon, resolved. 5. A 1.2 cm precarinal lymph node was seen on a PET scan in December 2007 without associated increased FDG activity. A PET scan 09/16/2007 revealed no evidence for recurrent melanoma. 6. 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 7. Left and Right low leg/ankle edema: Etiology unclear.    Disposition:  Nicholas [name]  remains in clinical remission from melanoma. He would [facility]. He will return for an office visit in one year. He continues clinical follow-up with dermatology. He plans to schedule a dermatology appointment to evaluate the skin lesions near the temples.  Betsy Coder, MD  05/03/2016  12:46 PM

## 2016-05-03 NOTE — Telephone Encounter (Signed)
Avs report and appointment schedule given to patient, per 05/03/16 los. ° °

## 2016-05-04 ENCOUNTER — Ambulatory Visit: Payer: PPO | Admitting: Oncology

## 2016-05-12 DIAGNOSIS — G4733 Obstructive sleep apnea (adult) (pediatric): Secondary | ICD-10-CM | POA: Diagnosis not present

## 2016-05-16 ENCOUNTER — Encounter: Payer: Self-pay | Admitting: Family Medicine

## 2016-05-16 ENCOUNTER — Ambulatory Visit (INDEPENDENT_AMBULATORY_CARE_PROVIDER_SITE_OTHER): Payer: PPO | Admitting: Family Medicine

## 2016-05-16 VITALS — BP 128/82 | HR 92 | Temp 98.6°F | Wt 229.0 lb

## 2016-05-16 DIAGNOSIS — R351 Nocturia: Secondary | ICD-10-CM

## 2016-05-16 DIAGNOSIS — I10 Essential (primary) hypertension: Secondary | ICD-10-CM

## 2016-05-16 DIAGNOSIS — N401 Enlarged prostate with lower urinary tract symptoms: Secondary | ICD-10-CM

## 2016-05-16 NOTE — Assessment & Plan Note (Signed)
S: controlled now off metoprolol. HR ok as well. BP better since on cpap BP Readings from Last 3 Encounters:  05/16/16 128/82  05/03/16 133/82  05/03/16 120/70  A/P:Continue without  meds:  Monitor at home with BP goal <140/90

## 2016-05-16 NOTE — Patient Instructions (Addendum)
Set you loose with no blood pressure medicine- blood pressures have been far better on CPAP. Keep an eye on blood pressure with goal <140/90  Glad nighttime urination is better but with urgency in day- try saw palmetto. Return to see [place] 6 months and if not doing better- consider finasteride

## 2016-05-16 NOTE — Progress Notes (Signed)
Pre visit review using our clinic review tool, if applicable. No additional management support is needed unless otherwise documented below in the visit note. 

## 2016-05-16 NOTE — Progress Notes (Signed)
Subjective:  Nicholas [name] is a 70 y.o. year old very pleasant male patient who presents for/with See problem oriented charting ROS- continued edema, continued mild lightheadedness. No chest pain or shortness of breath. .see any ROS included in HPI as well.   Past Medical History-  Patient Active Problem List   Diagnosis Date Noted  . 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 Diastolic CHF ([place]) 123456    Priority: High  . MALIGNANT MELANOMA SKIN LOWER LIMB INCLUDING HIP 03/09/2009    Priority: High  . OSA (obstructive sleep apnea) 12/28/2015    Priority: Medium  . Chronic venous insufficiency 02/20/2014    Priority: Medium  . Hyperglycemia 08/02/2012    Priority: Medium  . Hyperlipidemia 12/13/2006    Priority: Medium  . Essential hypertension 12/13/2006    Priority: Medium  . Senile purpura ([place]) 05/03/2016    Priority: Low  . Hypersomnia 11/15/2015    Priority: Low  . Former smoker 04/29/2014    Priority: Low  . Obesity 07/13/2010    Priority: Low  . Insomnia 10/03/2007    Priority: Low  . BPH associated with nocturia 05/16/2016  . Cough 11/15/2015  . Dizziness 12/30/2014    Medications- reviewed and updated Current Outpatient Prescriptions  Medication Sig Dispense Refill  . carbidopa-levodopa (SINEMET IR) 25-100 MG tablet Take 1.5 tablets by mouth See admin instructions. PT TAKES 1.5 TAB IN MORNING, 1 TAB AT NOON, AND 1 TAB IN EVENING    . Ibuprofen-Diphenhydramine Cit (ADVIL PM PO) Take 1 tablet by mouth at bedtime. Reported on 11/19/2015    . rosuvastatin (CRESTOR) 10 MG tablet Take 1 tablet (10 mg total) by mouth daily. 30 tablet 11   No current facility-administered medications for this visit.     Objective: BP 128/82 (BP Location: Left Arm, Patient Position: Sitting, Cuff Size: Large)   Pulse 92   Temp 98.6 F (37 C) (Oral)   Wt 229 lb (103.9 kg)   SpO2 95%   BMI 35.87 kg/m  Gen: NAD, resting comfortably CV: RRR no murmurs  rubs or gallops Lungs: CTAB no crackles, wheeze, rhonchi Abdomen: soft/nontender/nondistended/normal bowel sounds. No rebound or guarding.  Ext: chronic significant pitting edema noted Skin: warm, dry  Assessment/Plan:   Essential hypertension S: controlled now off metoprolol. HR ok as well. BP better since on cpap BP Readings from Last 3 Encounters:  05/16/16 128/82  05/03/16 133/82  05/03/16 120/70  A/P:Continue without  meds:  Monitor at home with BP goal <140/90  BPH associated with nocturia S:Nocturia 3-4x a night previous- once a night now. During the day- has to rush to the bathroom. Modestly enlarged prostate 2015.  Lab Results  Component Value Date   PSA 0.58 08/25/2013   PSA 0.48 04/24/2012  A/P: discussed flomax but concerned about orthostatic risk. Discussed finasteride- but does not want to take a medicine for 3-6 months to know if it will work. He wants to trial saw palmetto which has not helped in past but hopes may help this time around.    Return in about 6 months (around 11/14/2016) for physical.  Return precautions advised.  [name], MD

## 2016-05-16 NOTE — Assessment & Plan Note (Signed)
S:Nocturia 3-4x a night previous- once a night now. During the day- has to rush to the bathroom. Modestly enlarged prostate 2015.  Lab Results  Component Value Date   PSA 0.58 08/25/2013   PSA 0.48 04/24/2012  A/P: discussed flomax but concerned about orthostatic risk. Discussed finasteride- but does not want to take a medicine for 3-6 months to know if it will work. He wants to trial saw palmetto which has not helped in past but hopes may help this time around.

## 2016-05-30 ENCOUNTER — Encounter: Payer: Self-pay | Admitting: Family Medicine

## 2016-05-30 DIAGNOSIS — C44319 Basal cell carcinoma of skin of other parts of face: Secondary | ICD-10-CM | POA: Diagnosis not present

## 2016-05-30 DIAGNOSIS — L821 Other seborrheic keratosis: Secondary | ICD-10-CM | POA: Diagnosis not present

## 2016-05-30 DIAGNOSIS — D485 Neoplasm of uncertain behavior of skin: Secondary | ICD-10-CM | POA: Diagnosis not present

## 2016-05-30 DIAGNOSIS — Z85828 Personal history of other malignant neoplasm of skin: Secondary | ICD-10-CM | POA: Diagnosis not present

## 2016-06-12 DIAGNOSIS — G4733 Obstructive sleep apnea (adult) (pediatric): Secondary | ICD-10-CM | POA: Diagnosis not present

## 2016-07-04 DIAGNOSIS — Z85828 Personal history of other malignant neoplasm of skin: Secondary | ICD-10-CM | POA: Diagnosis not present

## 2016-07-04 DIAGNOSIS — C44319 Basal cell carcinoma of skin of other parts of face: Secondary | ICD-10-CM | POA: Diagnosis not present

## 2016-07-12 DIAGNOSIS — G4733 Obstructive sleep apnea (adult) (pediatric): Secondary | ICD-10-CM | POA: Diagnosis not present

## 2016-07-18 ENCOUNTER — Other Ambulatory Visit: Payer: Self-pay | Admitting: Family Medicine

## 2016-07-20 DIAGNOSIS — G4733 Obstructive sleep apnea (adult) (pediatric): Secondary | ICD-10-CM | POA: Diagnosis not present

## 2016-07-28 NOTE — Progress Notes (Signed)
Nicholas [name] was seen today in the movement disorders clinic for neurologic consultation at the request of [name].  The consultation is for the evaluation of [name] disease.  I have reviewed records both from [name] as welll as from [name].  The patient is accompanied by his wife who supplements the history.  The patient first presented to [name] in June, 2016 with lightheadedness and dizziness.  He reported that that symptom had been present since 2015.  [name] noted that the patient looked parkinsonian.  In September, 2016 she started him on ropinirole, 0.25 mg twice a day but he stopped the medication after only a few days because of worsening of dizziness.  He saw Dr. Nicki Reaper in December, 2016 for a second opinion and Dr. Nicki Reaper felt that he had idiopathic Parkinson's disease and started him on levodopa.  He last saw Dr. Nicki Reaper in February, 2016 and he increased the patient's carbidopa/levodopa 25/100-1-1/2 tablets in the morning, one tablet in the afternoon and one tablet in the evening.  The patient is still on this dosage.  The patient had an MRI of the brain that I had the opportunity to review.  This was done without gadolinium in October, 2016.  There were only a few scattered T2 hyperintensities and it was otherwise unremarkable.  07/31/16 update:  The patient follows up today.  He is on carbidopa/levodopa 25/100, 1-1/2 tablets in the morning, one tablet in the afternoon and one in the evening.  Pt denies falls.  Admits to lightheadedness but no near syncope.  No hallucinations.  Mood has been good.  No longer in [employer] classes but in [employer] classes (monday/thurs).    PREVIOUS MEDICATIONS: Sinemet and requip (only few days and was dizzy and was d/c)  ALLERGIES:   Allergies  Allergen Reactions  . Amoxicillin Swelling    Presumed angioedema of lips due to prednisone    CURRENT MEDICATIONS:  Outpatient Encounter Prescriptions as of 07/31/2016  Medication  Sig  . Ibuprofen-Diphenhydramine Cit (ADVIL PM PO) Take 1 tablet by mouth at bedtime. Reported on 11/19/2015  . rosuvastatin (CRESTOR) 10 MG tablet TAKE 1 TABLET ONCE DAILY.  . carbidopa-levodopa (SINEMET IR) 25-100 MG tablet Take by mouth See admin instructions. PT TAKES 1.5 TAB IN MORNING, 1 TAB AT NOON, AND 1 TAB IN EVENING   No facility-administered encounter medications on file as of 07/31/2016.     PAST MEDICAL HISTORY:   Past Medical History:  Diagnosis Date  . Cancer [facility]) Jan 2008   skin; hx of melanoma left foot/ amputation of toes 1&2   . Hyperlipidemia   . Hypertension   . Lymphedema     PAST SURGICAL HISTORY:   Past Surgical History:  Procedure Laterality Date  . 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 removal of melanoma of left foot/amputation of toes 1&25 Jul 2006  . WRIST FRACTURE SURGERY     71 years old-set    SOCIAL HISTORY:   Social History   Social History  . Marital status: Married    Spouse name: N/A  . Number of children: N/A  . Years of education: N/A   Occupational History  . retired     Engineer, maintenance ([employer])   Social History Main Topics  . Smoking status: Former Smoker    Packs/day: 1.00    Years: 16.00    Types: Cigarettes    Quit date: 12/22/1993  . Smokeless tobacco: Never Used  .  Alcohol use 12.6 oz/week    21 Standard drinks or equivalent per week     Comment: one time every few months now  . Drug use: No  . Sexual activity: Not on file   Other Topics Concern  . Not on file   Social History Narrative   Married 1981. Wife has kids-1 with 1 adopted grandchild.       Retired Tax adviser      Highest level of education:  B.S.      Hobbies: antiques, former Air cabin crew      Exercise: none currently.     FAMILY HISTORY:   Family Status  Relation Status  . Father Deceased at age 12  . Mother Deceased at age 31  . Sister Alive  . Brother Alive    ROS:  A complete 10 system review of systems was obtained and was unremarkable apart from what is  mentioned above.  PHYSICAL EXAMINATION:    VITALS:   Vitals:   07/31/16 1406  BP: 140/84  Pulse: 92  Weight: 231 lb (104.8 kg)  Height: 5\' 7"  (1.702 m)    GEN:  The patient appears stated age and is in NAD. HEENT:  Normocephalic, atraumatic.  The mucous membranes are moist. The superficial temporal arteries are without ropiness or tenderness. CV:  RRR Lungs:  CTAB Neck/HEME:  There are no carotid bruits bilaterally. Skin:  LE edema is significant but chronic  Neurological examination:  Orientation: The patient is alert and oriented x3.  Cranial nerves: There is good facial symmetry. Extraocular muscles are intact. The visual fields are full to confrontational testing. The speech is fluent and clear. Soft palate rises symmetrically and there is no tongue deviation. Hearing is intact to conversational tone. Sensation: Sensation is intact to light touch throughout Motor: Strength is 5/5 in the bilateral upper and lower extremities.   Shoulder shrug is equal and symmetric.  There is no pronator drift.  Movement examination: Tone: There is mild increased tone in the UE bilaterally Abnormal movements: There is none Coordination:  There is decremation with RAM's, seen with alternation of alternation of supination/pronation of the forearm bilaterally Gait and Station: The patient has no difficulty arising out of a deep-seated chair without the use of the hands. He has start hesitation.  He then is able to walk well down the hallway but tries to turn quickly and gets off balance but rights himself.  He has a positive pull test  ASSESSMENT/PLAN:  1.  Idiopathic Parkinson's disease but I do think that PSP could be on the Ddx as he does have a fixed face stare/eyes.  However, he really otherwise looked very good and he had even missed his middle of the day dose of meds.    -We discussed the diagnosis as well as pathophysiology of the disease.  We discussed treatment options as well as  prognostic indicators.  Patient education was provided.  -increase carbidopa/levodopa 25/100, 1.5 tablets three times per day  -We discussed community resources in the area including patient support groups and community exercise programs for PD and pt education was provided to the patient.   Pt is enrolled the [employer] class but encouraged him to re-enroll in [employer] classes 2.  History of melanoma  -I had a long discussion with the patient regarding the fact that we used to believe that levodopa increased once risk for melanoma, but it is now believed that patients who have Parkinson's disease have an increased risk for melanoma.  He should continue to get his yearly skin checks.  Just had last one done a few months ago.  Saw Dr. Learta Codding on 05/03/16. 3.  Vasomotor rhinorrhea  -mild and he is not ready for meds 4.  HTN  -now off meds and feels that he is doing well. Following with Dr. Yong Channel and his notes reviewed. 5.  Follow up is anticipated in the next few months, sooner should new neurologic issues arise.  Much greater than 50% of this visit was spent in counseling and coordinating care.  Total face to face time:  25 min

## 2016-07-31 ENCOUNTER — Encounter: Payer: Self-pay | Admitting: Neurology

## 2016-07-31 ENCOUNTER — Ambulatory Visit (INDEPENDENT_AMBULATORY_CARE_PROVIDER_SITE_OTHER): Payer: PPO | Admitting: Neurology

## 2016-07-31 VITALS — BP 140/84 | HR 92 | Ht 67.0 in | Wt 231.0 lb

## 2016-07-31 DIAGNOSIS — Z85820 Personal history of malignant melanoma of skin: Secondary | ICD-10-CM | POA: Diagnosis not present

## 2016-07-31 DIAGNOSIS — G20 Parkinson's disease: Secondary | ICD-10-CM | POA: Diagnosis not present

## 2016-07-31 NOTE — Patient Instructions (Signed)
Increase Levodopa to 1.5 tablets three times daily.

## 2016-08-12 DIAGNOSIS — G4733 Obstructive sleep apnea (adult) (pediatric): Secondary | ICD-10-CM | POA: Diagnosis not present

## 2016-08-30 DIAGNOSIS — Z08 Encounter for follow-up examination after completed treatment for malignant neoplasm: Secondary | ICD-10-CM | POA: Diagnosis not present

## 2016-08-30 DIAGNOSIS — Z85820 Personal history of malignant melanoma of skin: Secondary | ICD-10-CM | POA: Diagnosis not present

## 2016-08-30 DIAGNOSIS — I890 Lymphedema, not elsewhere classified: Secondary | ICD-10-CM | POA: Diagnosis not present

## 2016-09-12 DIAGNOSIS — G20 Parkinson's disease: Secondary | ICD-10-CM | POA: Diagnosis not present

## 2016-09-12 DIAGNOSIS — G4733 Obstructive sleep apnea (adult) (pediatric): Secondary | ICD-10-CM | POA: Diagnosis not present

## 2016-10-10 DIAGNOSIS — G4733 Obstructive sleep apnea (adult) (pediatric): Secondary | ICD-10-CM | POA: Diagnosis not present

## 2016-10-11 DIAGNOSIS — Z08 Encounter for follow-up examination after completed treatment for malignant neoplasm: Secondary | ICD-10-CM | POA: Diagnosis not present

## 2016-10-11 DIAGNOSIS — Z87891 Personal history of nicotine dependence: Secondary | ICD-10-CM | POA: Diagnosis not present

## 2016-10-11 DIAGNOSIS — R918 Other nonspecific abnormal finding of lung field: Secondary | ICD-10-CM | POA: Diagnosis not present

## 2016-10-11 DIAGNOSIS — I890 Lymphedema, not elsewhere classified: Secondary | ICD-10-CM | POA: Diagnosis not present

## 2016-10-11 DIAGNOSIS — Z85820 Personal history of malignant melanoma of skin: Secondary | ICD-10-CM | POA: Diagnosis not present

## 2016-10-11 DIAGNOSIS — R938 Abnormal findings on diagnostic imaging of other specified body structures: Secondary | ICD-10-CM | POA: Diagnosis not present

## 2016-10-20 DIAGNOSIS — G4733 Obstructive sleep apnea (adult) (pediatric): Secondary | ICD-10-CM | POA: Diagnosis not present

## 2016-11-10 DIAGNOSIS — G4733 Obstructive sleep apnea (adult) (pediatric): Secondary | ICD-10-CM | POA: Diagnosis not present

## 2016-11-14 ENCOUNTER — Ambulatory Visit: Payer: PPO

## 2016-11-14 ENCOUNTER — Telehealth: Payer: Self-pay | Admitting: Neurology

## 2016-11-14 ENCOUNTER — Ambulatory Visit: Payer: PPO | Admitting: Occupational Therapy

## 2016-11-14 ENCOUNTER — Ambulatory Visit: Payer: PPO | Attending: Family Medicine | Admitting: Physical Therapy

## 2016-11-14 DIAGNOSIS — G20 Parkinson's disease: Secondary | ICD-10-CM

## 2016-11-14 DIAGNOSIS — G20A1 Parkinson's disease without dyskinesia, without mention of fluctuations: Secondary | ICD-10-CM

## 2016-11-14 DIAGNOSIS — R29818 Other symptoms and signs involving the nervous system: Secondary | ICD-10-CM

## 2016-11-14 DIAGNOSIS — R2689 Other abnormalities of gait and mobility: Secondary | ICD-10-CM

## 2016-11-14 DIAGNOSIS — R471 Dysarthria and anarthria: Secondary | ICD-10-CM

## 2016-11-14 NOTE — Therapy (Signed)
[facility] [address] [place], [place], [address] Phone: [phone]   Fax:  [phone]  Patient Details  Name: Nicholas [name] MRN: [id number] Date of Birth: 1945-12-10 Referring Provider:  [name], MD  Encounter Date: 11/14/2016  Speech Therapy Parkinson's Disease Screen   Decibel Level today: 68dB  (WNL=70-72 dB) with sound level meter 30cm away from pt's mouth. Pt's conversational volume has decreased since last ST treatment course.  Pt does not report difficulty in swallowing warranting an objective swallowing evaluation. SLP recommended effortful swallow with all POs during meals/drinks.  Pt would benefit from speech-language eval for dysarthria - please order via EPIC to schedule.    [facility], [place]  11/14/2016, 1:52 PM  [facility] [address] [place], [place], [address] Phone: [phone]   Fax:  [phone]

## 2016-11-14 NOTE — Telephone Encounter (Signed)
Order entered

## 2016-11-14 NOTE — Telephone Encounter (Signed)
-----   Message from Sharen Counter, [place] sent at 11/14/2016  2:11 PM EDT ----- Dr. Toney Rakes- This patient was seen in our multi-D PD clinic today for OT, PT, and ST screens. #All three therapies were suggested# due to:  PT - slowed mobility and increased freezing episodes OT- increased time for ADLs ST - reduced conversational volume   If agreed, please order via EPIC.  As always, please reply or call [phone] if you should have questions or concerns. Thank you!  Glendell Docker

## 2016-11-14 NOTE — Therapy (Signed)
[facility] [address] [place], [place], [address] Phone: [phone]   Fax:  [phone]  Patient Details  Name: Nicholas [name] MRN: [id number] Date of Birth: 1946-06-15 Referring Provider:  [name], MD  Encounter Date: 11/14/2016  Occupational Therapy Parkinson's Disease Screen  Hand dominance:  right   Physical Performance Test item #4 (donning/doffing jacket):  11.97 sec  Fastening/unfastening 3 buttons in:  41.03sec   9-hole peg test:    RUE  26.41 sec        LUE  33.62 sec  Change in ability to perform ADLs/IADLs:  incr time required for ADLs  Pt would benefit from occupational therapy evaluation due to  incr time for ADLs.     [facility] 11/14/2016, 1:44 PM  [facility] [address] [place], [place], [address] Phone: [phone]   Fax:  [place], OTR/L [facility] [address] [place], [place]  [address] [phone] phone [phone] 11/14/16 3:32 PM

## 2016-11-14 NOTE — Therapy (Signed)
[facility] [address] [place], [place], [address] Phone: [phone]   Fax:  [phone]  Patient Details  Name: Nicholas [name] MRN: [id number] Date of Birth: 1946-02-04 Referring Provider:  [name], MD  Encounter Date: 11/14/2016   Physical Therapy Parkinson's Disease Screen   Timed Up and Go test:16.50 (compared to 12.74 sec)  10 meter walk test: 11.53 sec (2.84 ft/sec) (compared to 3.12 ft/sec)  5 time sit to stand test:11.53 sec (compared to 10.14 sec )  Patient would benefit from Physical Therapy evaluation due to slowed mobility, increased freezing episodes.     Ovie Eastep W. 11/14/2016, 1:16 PM  [name]., PT  [facility] [address] [place], [place], [address] Phone: [phone]   Fax:  [phone]

## 2016-11-22 DIAGNOSIS — I890 Lymphedema, not elsewhere classified: Secondary | ICD-10-CM | POA: Diagnosis not present

## 2016-11-27 NOTE — Progress Notes (Signed)
Nicholas [name] was seen today in the movement disorders clinic for neurologic consultation at the request of [name].  The consultation is for the evaluation of [name] disease.  I have reviewed records both from [name] as welll as from [name].  The patient is accompanied by his wife who supplements the history.  The patient first presented to [name] in June, 2016 with lightheadedness and dizziness.  He reported that that symptom had been present since 2015.  [name] noted that the patient looked parkinsonian.  In September, 2016 she started him on ropinirole, 0.25 mg twice a day but he stopped the medication after only a few days because of worsening of dizziness.  He saw Dr. Nicki Reaper in December, 2016 for a second opinion and Dr. Nicki Reaper felt that he had idiopathic Parkinson's disease and started him on levodopa.  He last saw Dr. Nicki Reaper in February, 71 and he increased the patient's carbidopa/levodopa 25/100-1-1/2 tablets in the morning, one tablet in the afternoon and one tablet in the evening.  The patient is still on this dosage.  The patient had an MRI of the brain that I had the opportunity to review.  This was done without gadolinium in October, 2016.  There were only a few scattered T2 hyperintensities and it was otherwise unremarkable.  07/31/16 update:  The patient follows up today.  He is on carbidopa/levodopa 25/100, 1-1/2 tablets in the morning, one tablet in the afternoon and one in the evening.  Pt denies falls.  Admits to lightheadedness but no near syncope.  No hallucinations.  Mood has been good.  No longer in [employer] classes but in [place] biking classes (monday/thurs).    11/28/16 update:  Patient seen today in follow-up.  Patients levodopa was increased last visit to carbidopa/levodopa 25/100, 1-1/2 tablets 3 times per day.  He states that made him dizzy.  Pt denies falls. No hallucinations.  Mood has been good.  Went to [facility] for his lymphedema and just  increased his mm Hg on his lymphedema.  Saw Dr. Nicki Reaper since our last visit as well.  Told him to increase levodopa to 1.5/1.5/1.  Going to [facility] cycle class 2 days a week.  Starting PT on 5/24.    PREVIOUS MEDICATIONS: Sinemet and requip (only few days and was dizzy and was d/c)  ALLERGIES:   Allergies  Allergen Reactions  . Amoxicillin Swelling    Presumed angioedema of lips due to prednisone    CURRENT MEDICATIONS:  Outpatient Encounter Prescriptions as of 11/28/2016  Medication Sig  . carbidopa-levodopa (SINEMET IR) 25-100 MG tablet Take by mouth See admin instructions. 1.5 tab in the morning, 1.5 tab in the afternoon, 1 tab in the evening  . diphenhydrAMINE (BENADRYL) 25 mg capsule Take 25 mg by mouth at bedtime.  . rosuvastatin (CRESTOR) 10 MG tablet TAKE 1 TABLET ONCE DAILY.  . [DISCONTINUED] Ibuprofen-Diphenhydramine Cit (ADVIL PM PO) Take 1 tablet by mouth at bedtime. Reported on 11/19/2015   No facility-administered encounter medications on file as of 11/28/2016.     PAST MEDICAL HISTORY:   Past Medical History:  Diagnosis Date  . Cancer [facility]) Jan 2008   skin; hx of melanoma left foot/ amputation of toes 1&2   . Hyperlipidemia   . Hypertension   . Lymphedema     PAST SURGICAL HISTORY:   Past Surgical History:  Procedure Laterality Date  . 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 removal  of melanoma of left foot/amputation of toes 1&25 Jul 2006  . WRIST FRACTURE SURGERY     71 years old-set    SOCIAL HISTORY:   Social History   Social History  . Marital status: Married    Spouse name: N/A  . Number of children: N/A  . Years of education: N/A   Occupational History  . retired     Engineer, maintenance ([employer])   Social History Main Topics  . Smoking status: Former Smoker    Packs/day: 1.00    Years: 16.00    Types: Cigarettes    Quit date: 12/22/1993  . Smokeless tobacco: Never Used  . Alcohol use 12.6 oz/week    21 Standard drinks or equivalent per week     Comment: one time  every few months now  . Drug use: No  . Sexual activity: Not on file   Other Topics Concern  . Not on file   Social History Narrative   Married 1981. Wife has kids-1 with 1 adopted grandchild.       Retired Tax adviser      Highest level of education:  B.S.      Hobbies: antiques, former [employer] cabin crew      Exercise: none currently.     FAMILY HISTORY:   Family Status  Relation Status  . Father Deceased at age 12  . Mother Deceased at age 61  . Sister Alive  . Brother Alive    ROS:  A complete 10 system review of systems was obtained and was unremarkable apart from what is mentioned above.  PHYSICAL EXAMINATION:    VITALS:   Vitals:   11/28/16 1459  BP: 130/84  Pulse: 88  SpO2: 95%  Weight: 231 lb (104.8 kg)  Height: 5' 6.5" (1.689 m)    GEN:  The patient appears stated age and is in NAD. HEENT:  Normocephalic, atraumatic.  The mucous membranes are moist. The superficial temporal arteries are without ropiness or tenderness. CV:  RRR Lungs:  CTAB Neck/HEME:  There are no carotid bruits bilaterally. Skin:  LE edema is significant but chronic  Neurological examination:  Orientation: The patient is alert and oriented x3.  Cranial nerves: There is good facial symmetry. There is facial hypomimia.  Extraocular muscles are intact. The visual fields are full to confrontational testing. The speech is fluent and clear. Soft palate rises symmetrically and there is no tongue deviation. Hearing is intact to conversational tone. Sensation: Sensation is intact to light touch throughout Motor: Strength is 5/5 in the bilateral upper and lower extremities.   Shoulder shrug is equal and symmetric.  There is no pronator drift.  Movement examination: Tone: There is mild to mod increased tone in the RUE Abnormal movements: There is none Coordination:  There is decremation with RAM's, seen with alternation of alternation of supination/pronation of the forearm bilaterally Gait and  Station: The patient has no difficulty arising out of a deep-seated chair without the use of the hands. He has start hesitation.  He freezes slightly through the doorway.  He then is able to walk well down the hallway but tries to turn quickly and gets off balance but rights himself.  He has a positive pull test  ASSESSMENT/PLAN:  1.  Idiopathic Parkinson's disease but I do think that PSP could be on the Ddx as he does have a fixed face stare/eyes.    -We discussed the diagnosis as well as pathophysiology of the disease.  We discussed treatment options as well as  prognostic indicators.  Patient education was provided.  -continue carbidopa/levodopa 25/100, 1.5 tablets/1.5/1.  When we tried 1.5/1.5/1.5 he had dizziness.    -invited to July symposium on PD 2.  History of melanoma  -I had a long discussion with the patient regarding the fact that we used to believe that levodopa increased once risk for melanoma, but it is now believed that patients who have Parkinson's disease have an increased risk for melanoma.  He should continue to get his yearly skin checks.  Last appt was in [facility] 2018.   3.  Vasomotor rhinorrhea  -mild and he is not ready for meds 4.  HTN  -now off meds and feels that he is doing well. Following with Dr. Yong Channel and his notes reviewed. 5.  Follow up is anticipated in the next few months, sooner should new neurologic issues arise.  Much greater than 50% of this visit was spent in counseling and coordinating care.  Total face to face time:  25 min

## 2016-11-28 ENCOUNTER — Ambulatory Visit (INDEPENDENT_AMBULATORY_CARE_PROVIDER_SITE_OTHER): Payer: PPO | Admitting: Neurology

## 2016-11-28 ENCOUNTER — Encounter: Payer: Self-pay | Admitting: Neurology

## 2016-11-28 VITALS — BP 130/84 | HR 88 | Ht 66.5 in | Wt 231.0 lb

## 2016-11-28 DIAGNOSIS — G20A1 Parkinson's disease without dyskinesia, without mention of fluctuations: Secondary | ICD-10-CM

## 2016-11-28 DIAGNOSIS — Z85820 Personal history of malignant melanoma of skin: Secondary | ICD-10-CM

## 2016-11-28 DIAGNOSIS — G20 Parkinson's disease: Secondary | ICD-10-CM

## 2016-12-10 DIAGNOSIS — G4733 Obstructive sleep apnea (adult) (pediatric): Secondary | ICD-10-CM | POA: Diagnosis not present

## 2016-12-19 ENCOUNTER — Ambulatory Visit: Payer: PPO

## 2016-12-19 ENCOUNTER — Ambulatory Visit: Payer: PPO | Attending: Neurology | Admitting: Physical Therapy

## 2016-12-19 ENCOUNTER — Ambulatory Visit: Payer: PPO | Admitting: Occupational Therapy

## 2016-12-19 DIAGNOSIS — R278 Other lack of coordination: Secondary | ICD-10-CM

## 2016-12-19 DIAGNOSIS — R29818 Other symptoms and signs involving the nervous system: Secondary | ICD-10-CM

## 2016-12-19 DIAGNOSIS — R41841 Cognitive communication deficit: Secondary | ICD-10-CM | POA: Insufficient documentation

## 2016-12-19 DIAGNOSIS — R2689 Other abnormalities of gait and mobility: Secondary | ICD-10-CM

## 2016-12-19 DIAGNOSIS — R2681 Unsteadiness on feet: Secondary | ICD-10-CM | POA: Diagnosis not present

## 2016-12-19 DIAGNOSIS — R471 Dysarthria and anarthria: Secondary | ICD-10-CM

## 2016-12-19 DIAGNOSIS — R29898 Other symptoms and signs involving the musculoskeletal system: Secondary | ICD-10-CM | POA: Diagnosis not present

## 2016-12-19 NOTE — Therapy (Signed)
[facility] [address] [place], [place], [address] Phone: [phone]   Fax:  [phone]  Speech Language Pathology Evaluation  Patient Details  Name: Nicholas [name] MRN: [id number] Date of Birth: 1945/10/19 Referring Provider: Alonza Bogus, DO  Encounter Date: 12/19/2016      End of Session - 12/19/16 1438    Visit Number 1   Number of Visits 17   Date for SLP Re-Evaluation 02/23/17   SLP Start Time 1017   SLP Stop Time  1054   SLP Time Calculation (min) 37 min   Activity Tolerance Patient tolerated treatment well      Past Medical History:  Diagnosis Date  . 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 Hyperlipidemia   . Hypertension   . Lymphedema     Past Surgical History:  Procedure Laterality Date  . 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 removal of melanoma of left foot/amputation of toes 1&25 Jul 2006  . WRIST FRACTURE SURGERY     71 years old-set    There were no vitals filed for this visit.      Subjective Assessment - 12/19/16 1025    Subjective Pt presents today after ST screen last month detecting reduced conversaitoal volume.   Currently in Pain? No/denies            SLP Evaluation [facility] - 12/19/16 1025      SLP Visit Information   SLP Received On 12/19/16   Referring Provider [name], Wells Guiles, DO   Onset Date Dec 2016   Medical Diagnosis Parkinson's Disease     General Information   HPI Pt known to this therapist from previous course of therapy for speech loudness/intelligibility.     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  Beneficiary of several trusts     Cognition   Overall Cognitive Status --  Not formally tested today     Auditory Comprehension   Overall Auditory  Comprehension Appears within functional limits for tasks assessed     Verbal Expression   Overall Verbal Expression Appears within functional limits for tasks assessed     Oral Motor/Sensory Function   Overall Oral Motor/Sensory Function Impaired   Labial ROM Within Functional Limits   Labial Symmetry Within Functional Limits   Labial Strength Reduced Right   Labial Coordination WFL   Lingual ROM Within Functional Limits   Lingual Symmetry Within Functional Limits   Lingual Strength Reduced Right   Lingual Coordination WFL   Facial ROM Within Functional Limits   Velum Within Functional Limits     Motor Speech   Overall Motor Speech Impaired   Respiration Impaired   Level of Impairment Sentence   Phonation Low vocal intensity   Phonation Impaired   Volume Soft  average 68dB 8 minutes conv; incr'd 71dB 5 minutes-occ mod A                         SLP Education - 12/19/16 1437    Education provided Yes   Education Details loud /a/, abdominal use in speech loudness   Person(s) Educated Patient   Methods Explanation   Comprehension Verbalized understanding  SLP Short Term Goals - 12/19/16 1443      SLP SHORT TERM GOAL #1   Title pt will sustain loud /a/ at 94dB over 4 consecutive sessions   Time 4   Period Weeks   Status New     SLP SHORT TERM GOAL #2   Title pt will exhibit volume of 70dB in sentence responses 95%   Time 4   Period Weeks   Status New     SLP SHORT TERM GOAL #3   Title In 7 minutes simple conversation pt will achieve average speech volume of low 70s dB over two sessions   Time 4   Period Weeks   Status New          SLP Long Term Goals - 12/19/16 1720      SLP LONG TERM GOAL #1   Title pt will sustain loud /a/ at average 94dB over 4 consecutive sessions with SBA   Time 8   Period Weeks   Status New     SLP LONG TERM GOAL #2   Title pt will generate speech in 10 minutes mod complex conversation with average  70dB over three sessions   Time 8   Period Weeks   Status New          Plan - 12/19/16 1439    Clinical Impression Statement Pt presents today with suboptimal voice volume (average 68db in 8 minutes conversation) but incr'd to 71dB with 5 minutes conversation with occasional mod A. Pt would benefit from cognitive communication assessment ASAP. Pt deny overt s/s aspiration however SLP will monitor pt's swallow function. Pt would benefit from skilled ST focusing on improving his commnicative ability with family and in the community   Speech Therapy Frequency 2x / week   Duration --  8 weeks   Treatment/Interventions Compensatory techniques;Internal/external aids;SLP instruction and feedback;Functional tasks;Patient/family education;Cueing hierarchy;Environmental controls;Cognitive reorganization  any or all may be used   Potential to Achieve Goals Good   SLP Home Exercise Plan loud /a/   Consulted and Agree with Plan of Care Patient      Patient will benefit from skilled therapeutic intervention in order to improve the following deficits and impairments:   Dysarthria and anarthria - Plan: SLP plan of care cert/re-cert  Cognitive communication deficit - Plan: SLP plan of care cert/re-cert      G-Codes - 42/70/62 1722    Functional Assessment Tool Used noms    Functional Limitations Motor speech   Motor Speech Current Status [phone]) At least 1 percent but less than 20 percent impaired, limited or restricted   Motor Speech Goal Status (B1517) At least 20 percent but less than 40 percent impaired, limited or restricted   Motor Speech Goal Status (O1607) At least 1 percent but less than 20 percent impaired, limited or restricted      Problem List Patient Active Problem List   Diagnosis Date Noted  . BPH associated with nocturia 05/16/2016  . Senile purpura ([place]) 05/03/2016  . OSA (obstructive sleep apnea) 12/28/2015  . Hypersomnia 11/15/2015  . Cough 11/15/2015  . 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 Dizziness 12/30/2014  . Diastolic CHF ([place]) 37/04/6268  . Former smoker 04/29/2014  . Chronic venous insufficiency 02/20/2014  . Hyperglycemia 08/02/2012  . Obesity 07/13/2010  . MALIGNANT MELANOMA SKIN LOWER LIMB INCLUDING HIP 03/09/2009  . Insomnia 10/03/2007  . Hyperlipidemia 12/13/2006  . Essential hypertension 12/13/2006    [facility] [place] ,[place], [place]  12/19/2016, 5:23 PM  [facility] [address] [place], [place], [address] Phone: [phone]   Fax:  [phone]  Name: ARNALDO HEFFRON MRN: [id number] Date of Birth: 06/14/1946

## 2016-12-19 NOTE — Therapy (Signed)
Nicholas [name] [address] [place], [place], [address] Phone: [phone]   Fax:  [phone]  Physical Therapy Evaluation  Patient Details  Name: Nicholas [name] MRN: [id number] Date of Birth: 1945-09-21 Referring Provider: Alonza Bogus  Encounter Date: 12/19/2016      PT End of Session - 12/19/16 1405    Visit Number 1   Number of Visits 17   Date for PT Re-Evaluation 02/17/17   Authorization Type [employer] every 10th visit   PT Start Time 1104   PT Stop Time 1146   PT Time Calculation (min) 42 min   Activity Tolerance Patient tolerated treatment well   Behavior During Therapy [facility] for tasks assessed/performed      Past Medical History:  Diagnosis Date  . 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 Hyperlipidemia   . Hypertension   . Lymphedema     Past Surgical History:  Procedure Laterality Date  . 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 removal of melanoma of left foot/amputation of toes 1&25 Jul 2006  . WRIST FRACTURE SURGERY     71 years old-set    There were no vitals filed for this visit.       Subjective Assessment - 12/19/16 1108    Subjective Pt reports he may be having more freezing episodes.  "I can't stand crowds."  Reports no falls.  Does not use assistive device.  Reports he is to go to [facility] later this week for lymphedema management of lower extremities.   Pertinent History lymphedema bilateral lower extremities, malignant melanoma lower leg s/p toe amputation L foot; obesity, CHF, OSA   Patient Stated Goals Pt's goal for therapy is to manage Parkinson's disease symptoms.   Currently in Pain? No/denies            [facility] PT Assessment - 12/19/16 1112      Assessment   Medical Diagnosis Parkinson's disease   Referring Provider [name], Wells Guiles   Onset Date/Surgical Date 11/14/16  PD screen     Precautions   Precautions Fall  lymphedema  bilateral lower extremities   Precaution Comments Pt wears compression stockings, but is going to [facility] for PT eval for lymphedema later this week per pt report     Balance Screen   Has the patient fallen in the past 6 months No   Has the patient had a decrease in activity level because of a fear of falling?  No   Is the patient reluctant to leave their home because of a fear of falling?  No  Reports avoiding crowds more often     Chief Technology Officer residence   Living Arrangements Spouse/significant other   Available Help at Discharge Family   Type of [place] to enter   Entrance Stairs-Number of Steps 3   [place] Two level;Able to live on main level with bedroom/bathroom  Office upstairs, mostly stays on first level     Prior Function   Level of Independence Independent  daily activities, "just take longer"   Leisure Participates in PD spin class 2x/wk.  Has a dog, but can't walk her due to freezing episodes.     Observation/Other Assessments   Focus on Therapeutic Outcomes (FOTO)  NA     Posture/Postural Control   Posture/Postural Control Postural limitations  Postural Limitations Forward head;Rounded Shoulders     ROM / Strength   AROM / PROM / Strength Strength     Strength   Overall Strength Deficits   Overall Strength Comments Grossly tested 4+/5 RLE, 4/5 LLE bilateral lower extremities     Transfers   Transfers Sit to Stand;Stand to Sit   Sit to Stand 6: Modified independent (Device/Increase time);Without upper extremity assist;From chair/3-in-1   Five time sit to stand comments  10.22  posterior lean last 2 reps   Stand to Sit 6: Modified independent (Device/Increase time);Without upper extremity assist;To chair/3-in-1  reports difficulty getting up from low, soft surfaces     Ambulation/Gait   Ambulation/Gait Yes   Ambulation/Gait Assistance 5: Supervision;6: Modified  independent (Device/Increase time)   Ambulation Distance (Feet) 200 Feet   Assistive device None   Gait Pattern Step-through pattern;Decreased arm swing - right;Decreased arm swing - left;Decreased step length - right;Decreased step length - left;Festinating;Wide base of support;Poor foot clearance - left;Poor foot clearance - right  forward flexed posture with festination   Ambulation Surface Level;Indoor   Gait velocity 10.63 sec = 3.09 ft/sec; with conversation:  12.50 sec = 2.62 ft/sec     Standardized Balance Assessment   Standardized Balance Assessment Timed Up and Go Test     Timed Up and Go Test   Normal TUG (seconds) 13.91   Manual TUG (seconds) 24.09   Cognitive TUG (seconds) 13.1   TUG Comments Scores >13.5 seconds indicate increased fall risk; >10% difference in scores indicates difficulty with dual tasking     High Level Balance   High Level Balance Comments Posterior push and release test:  2 steps in posterior direction     Functional Gait  Assessment   Gait assessed  Yes   Gait Level Surface Walks 20 ft, slow speed, abnormal gait pattern, evidence for imbalance or deviates 10-15 in outside of the 12 in walkway width. Requires more than 7 sec to ambulate 20 ft.  8.12   Change in Gait Speed Able to change speed, demonstrates mild gait deviations, deviates 6-10 in outside of the 12 in walkway width, or no gait deviations, unable to achieve a major change in velocity, or uses a change in velocity, or uses an assistive device.   Gait with Horizontal Head Turns Performs head turns smoothly with slight change in gait velocity (eg, minor disruption to smooth gait path), deviates 6-10 in outside 12 in walkway width, or uses an assistive device.   Gait with Vertical Head Turns Performs task with slight change in gait velocity (eg, minor disruption to smooth gait path), deviates 6 - 10 in outside 12 in walkway width or uses assistive device   Gait and Pivot Turn Pivot turns safely in  greater than 3 sec and stops with no loss of balance, or pivot turns safely within 3 sec and stops with mild imbalance, requires small steps to catch balance.   Step Over Obstacle Is able to step over one shoe box (4.5 in total height) without changing gait speed. No evidence of imbalance.   Gait with Narrow Base of Support Ambulates less than 4 steps heel to toe or cannot perform without assistance.   Gait with Eyes Closed Walks 20 ft, slow speed, abnormal gait pattern, evidence for imbalance, deviates 10-15 in outside 12 in walkway width. Requires more than 9 sec to ambulate 20 ft.  9.53 sec   Ambulating Backwards Cannot walk 20 ft without assistance, severe gait deviations or  imbalance, deviates greater than 15 in outside 12 in walkway width or will not attempt task.  13.50 in 10 ft, smaller steps and stops   Steps Two feet to a stair, must use rail.   Total Score 13   FGA comment: Scores <22/30 indicate increased fall risk. (decrease from 18/30 at PT d/c 09/2015)            Objective measurements completed on examination: See above findings.                    PT Short Term Goals - 12/19/16 1422      PT SHORT TERM GOAL #1   Title Pt will be independent with HEP for improved balance, transfers and gait.  TARGET 01/18/17   Time 4   Period Weeks   Status New     PT SHORT TERM GOAL #2   Title Pt will improve TUG score to less than or equal to 13 seconds for decreased fall risk.   Time 4   Period Weeks   Status New     PT SHORT TERM GOAL #3   Title Pt wil perform at least 8 of 10 reps of sit<>stand transfers from 18 inch surfaces or below, without UE support, independently for improved transfer efficiency and safety.   Time 4   Period Weeks   Status New     PT SHORT TERM GOAL #4   Title Pt will verbalize undersatnding of techniques to reduce freezing episodes with gait and turns     PT SHORT TERM GOAL #5   Title Pt will improve FGA to at least 16/30 for  decreased fall risk.   Time 4   Period Weeks   Status New           PT Long Term Goals - 12/19/16 1425      PT LONG TERM GOAL #1   Title Pt will verbalize understanding of fall prevention within the home environment.  TARGET 02/17/17   Time 8   Period Weeks   Status New     PT LONG TERM GOAL #2   Title Pt will improve TUG manual to less than or equal to 18 seconds for decreased fall risk/improved dual tasking with gait.   Time 8   Period Weeks   Status New     PT LONG TERM GOAL #3   Title Pt will improve gait velocity with conversation to at least 3 ft/sec for improved gait efficiency and safety.   Time 8   Period Weeks   Status New     PT LONG TERM GOAL #4   Title Pt will improve Functional Gait Assessment score to at least 19/30 for decreased fall risk.   Time 8   Period Weeks   Status New     PT LONG TERM GOAL #5   Title Pt will verbalize plans for optimal continued community fitness upon D/C from PT.   Time 8   Period Weeks   Status New                Plan - 12/19/16 1406    Clinical Impression Statement Pt is a 71 year old male who presents to OP PT with slowed mobility measures and reports of increase freezing at recent PD PT screen.  Pt has history of Parkinsonism, and at least 3 other co-morbidities.  He presents with decreaseding timing and coordination of gait, festination/freezing episodes with turns, initiating of gait, doorways and  crowded areas, abnormal posture, decreased balance.  Pt is at fall risk per TUG/TUG manual scores and FGA.  Differences noted with dual task activities with gait during conversation and manual carry tasks with gait.  Pt enjoys being out in the community, but has been avoiding activities with large crowds due to fear of falls.  He has not had any falls.  Pt will benefit from further skilled PT to address the above stated deficits to decrease fall risk and improve functional mobility.   History and Personal Factors relevant  to plan of care: melanoma of lower extremity s/p toe amputation L foot, OSA. obesity; lower extremity lymphedema (to be treated by PT at [facility])   Clinical Presentation Evolving   Clinical Presentation due to: increase of freezing episodes, at fall risk per TUG, FGA, posterior push and release test   Clinical Decision Making Moderate   Rehab Potential Good   PT Frequency 2x / week   PT Duration 8 weeks  plus eval   PT Treatment/Interventions ADLs/Self Care Home Management;Functional mobility training;Gait training;DME Instruction;Patient/family education;Neuromuscular re-education;Balance training;Therapeutic exercise;Therapeutic activities   PT Next Visit Plan Initiate/Review previous HEP-PWR! moves, weightshifting, turns, change of directions with gait.  Discuss techniques to reduce freezing with gait.   Consulted and Agree with Plan of Care Patient      Patient will benefit from skilled therapeutic intervention in order to improve the following deficits and impairments:  Abnormal gait, Decreased balance, Decreased mobility, Decreased strength, Difficulty walking, Postural dysfunction  Visit Diagnosis: Other abnormalities of gait and mobility  Other symptoms and signs involving the nervous system  Unsteadiness on feet      G-Codes - Jan 15, 2017 1427    Functional Assessment Tool Used (Outpatient Only) TUG 13.91, TUG cog 13.1, TUG man 24.09; gait velocity 2.62 ft/sec with conversation, 3.09 ft/sec, 5x sit<>stand 10.22 with posterior lean, FGA 13/30   Functional Limitation Mobility: Walking and moving around   Mobility: Walking and Moving Around Current Status [phone]) At least 40 percent but less than 60 percent impaired, limited or restricted   Mobility: Walking and Moving Around Goal Status [phone]) At least 20 percent but less than 40 percent impaired, limited or restricted       Problem List Patient Active Problem List   Diagnosis Date Noted  . BPH associated with nocturia  05/16/2016  . Senile purpura ([place]) 05/03/2016  . OSA (obstructive sleep apnea) 12/28/2015  . Hypersomnia 11/15/2015  . Cough 11/15/2015  . 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 Dizziness 12/30/2014  . Diastolic CHF ([place]) 37/90/2409  . Former smoker 04/29/2014  . Chronic venous insufficiency 02/20/2014  . Hyperglycemia 08/02/2012  . Obesity 07/13/2010  . MALIGNANT MELANOMA SKIN LOWER LIMB INCLUDING HIP 03/09/2009  . Insomnia 10/03/2007  . Hyperlipidemia 12/13/2006  . Essential hypertension 12/13/2006    [name]. 2017/01/15, 2:28 PM [name]., PT  [facility] [address] [place], [place], [address] Phone: [phone]   Fax:  [phone]  Name: TISON LEIBOLD MRN: [id number] Date of Birth: [birth year]

## 2016-12-19 NOTE — Therapy (Signed)
[facility] [address] [place], [place], [address] Phone: [phone]   Fax:  [phone]  Occupational Therapy Evaluation  Patient Details  Name: Nicholas [name] MRN: [id number] Date of Birth: [birth year] Referring Provider: Dr. Carles Collet  Encounter Date: 12/19/2016      OT End of Session - 12/19/16 1502    Visit Number 1   Number of Visits 17   Date for OT Re-Evaluation 02/13/17   Authorization Type [employer], G code needed   Authorization Time Period 60 days- 02/13/17   Authorization - Visit Number 1   Authorization - Number of Visits 10   OT Start Time 1150   OT Stop Time 1230   OT Time Calculation (min) 40 min   Activity Tolerance Patient tolerated treatment well   Behavior During Therapy [facility] for tasks assessed/performed      Past Medical History:  Diagnosis Date  . 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 Hyperlipidemia   . Hypertension   . Lymphedema     Past Surgical History:  Procedure Laterality Date  . 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 removal of melanoma of left foot/amputation of toes 1&25 Jul 2006  . WRIST FRACTURE SURGERY     71 years old-set    There were no vitals filed for this visit.      Subjective Assessment - 12/19/16 1152    Patient Stated Goals fluid movement, maintain independence   Currently in Pain? No/denies           [facility] OT Assessment - 12/19/16 1153      Assessment   Diagnosis Parkinson's disease   Referring Provider Dr. Carles Collet   Onset Date --  referral 11/14/16   Prior Therapy OT,OT, ST     Precautions   Precautions Fall   Precaution Comments Pt wears compression stockings, but is going to [facility] for [facility] eval for lymphedema later this week per pt report     Balance Screen   Has the patient fallen in the past 6 months No   Has the patient had a decrease in activity level because of a fear of falling?  Yes   Is the  patient reluctant to leave their home because of a fear of falling?  No     Home  Environment   Family/patient expects to be discharged to: Private residence   Living Arrangements Spouse/significant other   [place] to live on main level with bedroom/bathroom   Lives With Spouse     Prior Function   Level of Independence Independent   Leisure Participates in PD spin class 2x/wk.  Has a dog, but can't walk her due to freezing episodes.     ADL   Eating/Feeding Independent   Grooming Independent   Upper Body Bathing Modified independent  increased time   Lower Body Bathing Increased time   Upper Body Dressing Needs assist for fasteners;Increased time   Lower Body Dressing Modified independent  needs assist removing compression socks   Toilet Transfer Modified independent   Tub/Shower Transfer Modified independent   ADL comments Pt demonstrates increased freezing with turning. Pt reports bathing / dressing takes 1-1.5 hrs to complete.     IADL   Shopping --  does not shop   Light Housekeeping Does not participate in any housekeeping tasks   Meal Prep --  Pt reports he grills  Medication Management Is responsible for taking medication in correct dosages at correct time   Financial Management Manages financial matters independently (budgets, writes checks, pays rent, bills goes to bank), collects and keeps track of income     Mobility   Mobility Status Freezing;Independent     Written Expression   Dominant Hand Right   Handwriting Mild micrographia;100% legible     Vision - History   Baseline Vision Wears glasses all the time     Vision Assessment   Vision Assessment Vision not tested     Cognition   Overall Cognitive Status Within Functional Limits for tasks assessed     Observation/Other Assessments   Physical Performance Test   Yes   Simulated Eating Time (seconds) 13.84 secs   Donning Doffing Jacket Time (seconds) 10.29 secs   Donning Doffing Jacket  Comments 3 button/ unbutton 25.34 secs     Sensation   Light Touch Appears Intact     Coordination   9 Hole Peg Test Right;Left   Right 9 Hole Peg Test 26.47 secs   Left 9 Hole Peg Test 32.63 secs   Box and Blocks RUE 46 blocks, LUE 47 blocks     Tone   Assessment Location --  mild rigidity     ROM / Strength   AROM / PROM / Strength AROM     AROM   Overall AROM  Within functional limits for tasks performed                           OT Short Term Goals - 12/19/16 1513      OT SHORT TERM GOAL #1   Title I with updated PD specific HEP- due 01/18/17   Time 4   Period Weeks   Status New     OT SHORT TERM GOAL #2   Title Pt will verbalize understanding of adapted strategies for ADLs/IADLS.   Time 4   Period Weeks   Status New     OT SHORT TERM GOAL #3   Title Pt will demonstrate improved standing balance for ADLs as evidenced by improving standing functional reach to 10 inches or greater for LUE.   Baseline RUE 10.5 inches, LUE 9 inches   Time 4   Period Weeks   Status New     OT SHORT TERM GOAL #4   Title Pt will demonstrate improved bilateral UE functional use for ADLs as evidenced by  increasing box/ blocks by 3 blocks.   Baseline RUE 46 blocks, LUe 47 blocks   Time 4   Period Weeks   Status New           OT Long Term Goals - 12/19/16 1515      OT LONG TERM GOAL #1   Title Pt will verbalize understanding of ways to prevent future PD related complications and verbalize understanding of community resources.   Time 8   Period Weeks   Status New     OT LONG TERM GOAL #2   Title Pt will demonstrate improved LUE fine motor coordination for ADLS as evidenced by decreasing LUE 9 hole peg test  to 29 secs or less.   Time 8   Period Weeks   Status New               Plan - 12/19/16 1505    Clinical Impression Statement Pt with Parkinson's disease diagnosed in 2016 returns to occupational therapy with a  decline in ADLs and functional  mobility. Pt presents with the following deficits: decreased coordination, bradykinesia, decreased functional mobility, freezing, rigidity, decreased balance which impedes performance of daily activities. Pt can benefit from skilled occupational therapy to maximize safety and indpdnence with ADLs/ IADLs and to maintain quality of life.   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   Occupational performance deficits (Please refer to evaluation for details): ADL's;IADL's;Work;Play;Leisure;Social Participation   Rehab Potential Good   Current Impairments/barriers affecting progress: freezing episodes   OT Frequency 2x / week  plus eval, will likely d/c after 4-6 weeks   OT Duration 8 weeks   OT Treatment/Interventions Self-care/ADL training;DME and/or AE instruction;Patient/family education;Balance training;Therapeutic exercises;Therapeutic activities;Therapeutic exercise;Ultrasound;Neuromuscular education;Passive range of motion;Functional Mobility Training;Cognitive remediation/compensation;Visual/perceptual remediation/compensation;Manual Therapy;Energy conservation   Clinical Decision Making Limited treatment options, no task modification necessary   Consulted and Agree with Plan of Care Patient      Patient will benefit from skilled therapeutic intervention in order to improve the following deficits and impairments:  Abnormal gait, Decreased cognition, Decreased knowledge of use of DME, Increased edema, Impaired flexibility, Decreased mobility, Decreased coordination, Decreased activity tolerance, Decreased endurance, Decreased strength, Impaired tone, Impaired UE functional use, Decreased safety awareness, Decreased knowledge of precautions, Decreased balance  Visit Diagnosis: Other  symptoms and signs involving the nervous system - Plan: Ot plan of care cert/re-cert  Other abnormalities of gait and mobility - Plan: Ot plan of care cert/re-cert  Other lack of coordination - Plan: Ot plan of care cert/re-cert  Unsteadiness on feet - Plan: Ot plan of care cert/re-cert  Other symptoms and signs involving the musculoskeletal system - Plan: Ot plan of care cert/re-cert      G-Codes - 16/10/96 1657    Functional Assessment Tool Used (Outpatient only) standing functional reach: RUE 10.5, LUE 9 in, box/ blocks RUE 46 blocks, LUE 47 blocks   Functional Limitation Self care   Self Care Current Status (E4540) At least 20 percent but less than 40 percent impaired, limited or restricted   Self Care Goal Status (J8119) At least 1 percent but less than 20 percent impaired, limited or restricted      Problem List Patient Active Problem List   Diagnosis Date Noted  . BPH associated with nocturia 05/16/2016  . Senile purpura ([place]) 05/03/2016  . OSA (obstructive sleep apnea) 12/28/2015  . Hypersomnia 11/15/2015  . Cough 11/15/2015  . 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 Dizziness 12/30/2014  . Diastolic CHF ([place]) 14/78/2956  . Former smoker 04/29/2014  . Chronic venous insufficiency 02/20/2014  . Hyperglycemia 08/02/2012  . Obesity 07/13/2010  . MALIGNANT MELANOMA SKIN LOWER LIMB INCLUDING HIP 03/09/2009  . Insomnia 10/03/2007  . Hyperlipidemia 12/13/2006  . Essential hypertension 12/13/2006    Nicholas [name] 12/19/2016, 5:04 PM  [facility] [address] [place], [place], [address] Phone: [phone]   Fax:  [phone]  Name: Nicholas [name] MRN: [id number] Date of Birth: May 09, 1946

## 2016-12-21 DIAGNOSIS — I890 Lymphedema, not elsewhere classified: Secondary | ICD-10-CM | POA: Diagnosis not present

## 2016-12-21 DIAGNOSIS — R262 Difficulty in walking, not elsewhere classified: Secondary | ICD-10-CM | POA: Diagnosis not present

## 2017-01-01 ENCOUNTER — Encounter: Payer: PPO | Admitting: Occupational Therapy

## 2017-01-01 ENCOUNTER — Ambulatory Visit: Payer: PPO | Admitting: Physical Therapy

## 2017-01-01 ENCOUNTER — Encounter: Payer: PPO | Admitting: Speech Pathology

## 2017-01-02 ENCOUNTER — Ambulatory Visit: Payer: PPO | Admitting: Occupational Therapy

## 2017-01-02 ENCOUNTER — Ambulatory Visit: Payer: PPO | Attending: Neurology | Admitting: Speech Pathology

## 2017-01-02 DIAGNOSIS — R2681 Unsteadiness on feet: Secondary | ICD-10-CM

## 2017-01-02 DIAGNOSIS — R29898 Other symptoms and signs involving the musculoskeletal system: Secondary | ICD-10-CM

## 2017-01-02 DIAGNOSIS — R278 Other lack of coordination: Secondary | ICD-10-CM | POA: Diagnosis not present

## 2017-01-02 DIAGNOSIS — R29818 Other symptoms and signs involving the nervous system: Secondary | ICD-10-CM | POA: Insufficient documentation

## 2017-01-02 DIAGNOSIS — R2689 Other abnormalities of gait and mobility: Secondary | ICD-10-CM

## 2017-01-02 DIAGNOSIS — R41841 Cognitive communication deficit: Secondary | ICD-10-CM | POA: Insufficient documentation

## 2017-01-02 DIAGNOSIS — R471 Dysarthria and anarthria: Secondary | ICD-10-CM | POA: Diagnosis not present

## 2017-01-02 NOTE — Therapy (Addendum)
[facility] [address] [place], [place], [address] Phone: [phone]   Fax:  [phone]  Occupational Therapy Treatment  Patient Details  Name: Nicholas [name] MRN: [id number] Date of Birth: 11/14/1945 Referring Provider: Dr. Carles Collet  Encounter Date: 01/02/2017      OT End of Session - 01/02/17 1035    Visit Number 2   Number of Visits 17   Date for OT Re-Evaluation 02/13/17   Authorization Type [employer], G code needed   Authorization Time Period 60 days- 02/13/17   Authorization - Visit Number 2   Authorization - Number of Visits 10   OT Start Time 1021   OT Stop Time 1100   OT Time Calculation (min) 39 min   Activity Tolerance Patient tolerated treatment well   Behavior During Therapy [facility] for tasks assessed/performed      Past Medical History:  Diagnosis Date  . 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 Hyperlipidemia   . Hypertension   . Lymphedema     Past Surgical History:  Procedure Laterality Date  . 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 removal of melanoma of left foot/amputation of toes 1&25 Jul 2006  . WRIST FRACTURE SURGERY     71 years old-set    There were no vitals filed for this visit.                            OT Education - 01/02/17 1254    Education provided Yes   Education Details coordination HEP, reviewed PWR! Hands basic 4 5-10 reps each reviewed PWR! basic 4, 10-20 reps each   Person(s) Educated Patient   Methods Explanation;Demonstration;Tactile cues   Comprehension Verbalized understanding;Returned demonstration;Verbal cues required          OT Short Term Goals - 12/19/16 1513      OT SHORT TERM GOAL #1   Title I with updated PD specific HEP- due 01/18/17   Time 4   Period Weeks   Status New     OT SHORT TERM GOAL #2   Title Pt will verbalize understanding of adapted strategies for ADLs/IADLS.   Time 4   Period Weeks   Status New     OT SHORT TERM GOAL #3   Title Pt will demonstrate improved standing balance for ADLs as evidenced by improving standing functional reach to 10 inches or greater for LUE.   Baseline RUE 10.5 inches, LUE 9 inches   Time 4   Period Weeks   Status New     OT SHORT TERM GOAL #4   Title Pt will demonstrate improved bilateral UE functional use for ADLs as evidenced by  increasing box/ blocks by 3 blocks.   Baseline RUE 46 blocks, LUe 47 blocks   Time 4   Period Weeks   Status New           OT Long Term Goals - 12/19/16 1515      OT LONG TERM GOAL #1   Title Pt will verbalize understanding of ways to prevent future PD related complications and verbalize understanding of community resources.   Time 8   Period Weeks   Status New     OT LONG TERM GOAL #2   Title Pt will demonstrate improved LUE fine motor coordination for ADLS as evidenced by decreasing LUE 9 hole  peg test  to 29 secs or less.   Time 8   Period Weeks   Status New              Plan - 01/23/17 1230    Clinical Impression Statement Pt is progresssing towards goals. He demonstrates understanding of coordination HEP and PWR! basic 4 following review.   Rehab Potential Good   Current Impairments/barriers affecting progress: freezing episodes   OT Frequency 2x / week   OT Duration 8 weeks   OT Treatment/Interventions Self-care/ADL training;DME and/or AE instruction;Patient/family education;Balance training;Therapeutic exercises;Therapeutic activities;Therapeutic exercise;Ultrasound;Neuromuscular education;Passive range of motion;Functional Mobility Training;Cognitive remediation/compensation;Visual/perceptual remediation/compensation;Manual Therapy;Energy conservation    Plan: dynamic step and reach with strategies to address freezing.   Consulted and Agree with Plan of Care Patient         Patient will benefit from skilled therapeutic intervention in order to improve  the following deficits and impairments:     Visit Diagnosis: Other symptoms and signs involving the nervous system  Other abnormalities of gait and mobility  Other lack of coordination  Unsteadiness on feet  Other symptoms and signs involving the musculoskeletal system    Problem List Patient Active Problem List   Diagnosis Date Noted  . BPH associated with nocturia 05/16/2016  . Senile purpura ([place]) 05/03/2016  . OSA (obstructive sleep apnea) 12/28/2015  . Hypersomnia 11/15/2015  . Cough 11/15/2015  . 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 Dizziness 12/30/2014  . Diastolic CHF ([place]) 47/03/2956  . Former smoker 04/29/2014  . Chronic venous insufficiency 02/20/2014  . Hyperglycemia 08/02/2012  . Obesity 07/13/2010  . MALIGNANT MELANOMA SKIN LOWER LIMB INCLUDING HIP 03/09/2009  . Insomnia 10/03/2007  . Hyperlipidemia 12/13/2006  . Essential hypertension 12/13/2006    RINE,KATHRYN 01/02/2017, 12:57 PM  [facility] [address] [place], [place], [address] Phone: [phone]   Fax:  [phone]  Name: SABEN DONIGAN MRN: [id number] Date of Birth: [birth year]

## 2017-01-02 NOTE — Therapy (Signed)
[facility] [address] [place], [place], [address] Phone: [phone]   Fax:  [phone]  Speech Language Pathology Treatment  Patient Details  Name: Nicholas [name] MRN: [id number] Date of Birth: August 21, 1945 Referring Provider: Alonza Bogus, DO  Encounter Date: 01/02/2017      End of Session - 01/02/17 1259    Visit Number 2   Number of Visits 17   Date for SLP Re-Evaluation 02/23/17   SLP Start Time 53   SLP Stop Time  1144   SLP Time Calculation (min) 42 min   Activity Tolerance Patient tolerated treatment well      Past Medical History:  Diagnosis Date  . 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 Hyperlipidemia   . Hypertension   . Lymphedema     Past Surgical History:  Procedure Laterality Date  . 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 removal of melanoma of left foot/amputation of toes 1&25 Jul 2006  . WRIST FRACTURE SURGERY     71 years old-set    There were no vitals filed for this visit.      Subjective Assessment - 01/02/17 1112    Subjective "I do my Ah's in the shower and in my car"   Currently in Pain? No/denies               ADULT SLP TREATMENT - 01/02/17 1113      General Information   Behavior/Cognition Alert;Cooperative;Pleasant mood     Treatment Provided   Treatment provided Cognitive-Linquistic     Pain Assessment   Pain Assessment No/denies pain     Cognitive-Linquistic Treatment   Treatment focused on Dysarthria   Skilled Treatment Loud /a/ to recalibrate volume with average of 92dB and occasional min A. Simple strcutred speech task with oral reading and phrase generation with average of 70dB, however volume did decrease with spontaeous phrases and as cognitive load increased. Speech task describing objects with sentence responses with average of 68dB with ongoing cues for loudness and breath support. Simple  conversation with average of 68dB and frequent mod visual and verbal cues for loudness.      Assessment / Recommendations / Plan   Plan Continue with current plan of care     Progression Toward Goals   Progression toward goals Progressing toward goals          SLP Education - 01/02/17 1147    Education provided Yes   Education Details breath support for volume, environmental compensations for intellgiblity   Person(s) Educated Patient   Methods Explanation;Demonstration;Handout   Comprehension Verbalized understanding          SLP Short Term Goals - 01/02/17 1259      SLP SHORT TERM GOAL #1   Title pt will sustain loud /a/ at 94dB over 4 consecutive sessions   Time 4   Period Weeks   Status On-going     SLP SHORT TERM GOAL #2   Title pt will exhibit volume of 70dB in sentence responses 95%   Time 4   Period Weeks   Status On-going     SLP SHORT TERM GOAL #3   Title In 7 minutes simple conversation pt will achieve average speech volume of low 70s dB over two sessions   Time 4   Period Weeks   Status On-going          SLP  Long Term Goals - 01/02/17 1259      SLP LONG TERM GOAL #1   Title pt will sustain loud /a/ at average 94dB over 4 consecutive sessions with SBA   Time 8   Period Weeks   Status On-going     SLP LONG TERM GOAL #2   Title pt will generate speech in 10 minutes mod complex conversation with average 70dB over three sessions   Time 8   Period Weeks   Status On-going          Plan - 01/02/17 1254    Clinical Impression Statement Pt required  frequent ongoing visual and verbal cues and instructions to maintain 70dB in sentnece responses and simple conversation. He required frequent mod A for awareness of volume decay. Cognitive assessment deferred today, however reduced volume, slow processing and reasoning in simple cognitive task. Continue skilled ST to maximize intellgibility and compensations  cognition for independence and to reduce  caregiver burden.   Speech Therapy Frequency 2x / week   Treatment/Interventions Compensatory techniques;Internal/external aids;SLP instruction and feedback;Functional tasks;Patient/family education;Cueing hierarchy;Environmental controls;Cognitive reorganization   Potential to Achieve Goals Good   SLP Home Exercise Plan loud /a/   Consulted and Agree with Plan of Care Patient      Patient will benefit from skilled therapeutic intervention in order to improve the following deficits and impairments:   Dysarthria and anarthria  Cognitive communication deficit    Problem List Patient Active Problem List   Diagnosis Date Noted  . BPH associated with nocturia 05/16/2016  . Senile purpura ([place]) 05/03/2016  . OSA (obstructive sleep apnea) 12/28/2015  . Hypersomnia 11/15/2015  . Cough 11/15/2015  . 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 Dizziness 12/30/2014  . Diastolic CHF ([place]) 77/93/9030  . Former smoker 04/29/2014  . Chronic venous insufficiency 02/20/2014  . Hyperglycemia 08/02/2012  . Obesity 07/13/2010  . MALIGNANT MELANOMA SKIN LOWER LIMB INCLUDING HIP 03/09/2009  . Insomnia 10/03/2007  . Hyperlipidemia 12/13/2006  . Essential hypertension 12/13/2006    Lovvorn, Annye Rusk MS, [place] 01/02/2017, 1:00 PM  [facility] [address] [place], [place], [address] Phone: [phone]   Fax:  [phone]   Name: Nicholas [name] MRN: [id number] Date of Birth: [birth year]

## 2017-01-02 NOTE — Patient Instructions (Signed)
  Loud AH! 5x twice a day  We generate volume by taking a big abdominal breath  - if someone doesn't hear you, take a big breath and project  Oral reading with good volume taking a big breath at periods or commas to keep volume loud is good practice  It's OK (and expected) for you to feel like you are talking too loud  Be aware of increasing your volume in noisy environments such as restaurants, parties, group conversations   Tips to help people understand you especially when you are tired or are losing your voice:  Get the persons attention before you speak  Use eye contact and face the person you are speaking to  Be in close proximity to the person you are speaking to  Turn down any noise in the environment such as the TV, walk away from loud appliances, air conditioners, fans, dish washers etc

## 2017-01-02 NOTE — Patient Instructions (Signed)
Coordination Exercises  Perform the following exercises for 20 minutes 1 times per day. Perform with both hand(s). Perform using big movements.   Flipping Cards: Place deck of cards on the table. Flip cards over by opening your hand big to grasp and then turn your palm up big.  Deal cards: Hold 1/2 or whole deck in your hand. Use thumb to push card off top of deck with one big push.  Rotate ball with fingertips: Pick up with fingers/thumb and move as much as you can with each turn/movement (clockwise and counter-clockwise).  Toss ball from one hand to the other: Toss big/high.  Toss ball in the air and catch with the same hand: Toss big/high.  Rotate 2 golf balls in your hand: Both directions.  Pick up 5-10 coins one at a time and hold in palm. Then, move coins from palm to fingertips one at time and place in coin bank/container.  Pick up 5-10 coins one at a time and hold in palm. Then, move coins from palm to fingertips one at a time to stack.  Practice writing: Slow down, write big, and focus on forming each letter.  Perform "Flicks"/hand stretches (PWR! Hands): Close hands then flick out your fingers with focus on opening hands, pulling wrists back, and extending elbows like you are pushing.

## 2017-01-03 ENCOUNTER — Ambulatory Visit: Payer: PPO | Admitting: Physical Therapy

## 2017-01-03 ENCOUNTER — Encounter: Payer: PPO | Admitting: Speech Pathology

## 2017-01-08 ENCOUNTER — Ambulatory Visit: Payer: PPO | Admitting: Physical Therapy

## 2017-01-09 ENCOUNTER — Ambulatory Visit: Payer: PPO | Admitting: Physical Therapy

## 2017-01-10 DIAGNOSIS — G4733 Obstructive sleep apnea (adult) (pediatric): Secondary | ICD-10-CM | POA: Diagnosis not present

## 2017-01-15 ENCOUNTER — Ambulatory Visit: Payer: PPO | Admitting: Physical Therapy

## 2017-01-15 ENCOUNTER — Encounter: Payer: PPO | Admitting: Occupational Therapy

## 2017-01-16 DIAGNOSIS — D692 Other nonthrombocytopenic purpura: Secondary | ICD-10-CM | POA: Diagnosis not present

## 2017-01-16 DIAGNOSIS — D2272 Melanocytic nevi of left lower limb, including hip: Secondary | ICD-10-CM | POA: Diagnosis not present

## 2017-01-16 DIAGNOSIS — D2271 Melanocytic nevi of right lower limb, including hip: Secondary | ICD-10-CM | POA: Diagnosis not present

## 2017-01-16 DIAGNOSIS — D225 Melanocytic nevi of trunk: Secondary | ICD-10-CM | POA: Diagnosis not present

## 2017-01-16 DIAGNOSIS — L821 Other seborrheic keratosis: Secondary | ICD-10-CM | POA: Diagnosis not present

## 2017-01-16 DIAGNOSIS — Z85828 Personal history of other malignant neoplasm of skin: Secondary | ICD-10-CM | POA: Diagnosis not present

## 2017-01-20 ENCOUNTER — Other Ambulatory Visit: Payer: Self-pay | Admitting: Family Medicine

## 2017-01-22 ENCOUNTER — Encounter: Payer: PPO | Admitting: Occupational Therapy

## 2017-01-22 ENCOUNTER — Ambulatory Visit: Payer: PPO | Admitting: Physical Therapy

## 2017-01-23 ENCOUNTER — Ambulatory Visit: Payer: PPO | Attending: Neurology | Admitting: Physical Therapy

## 2017-01-23 ENCOUNTER — Ambulatory Visit: Payer: PPO

## 2017-01-23 ENCOUNTER — Encounter: Payer: Self-pay | Admitting: Physical Therapy

## 2017-01-23 ENCOUNTER — Ambulatory Visit: Payer: PPO | Admitting: Occupational Therapy

## 2017-01-23 DIAGNOSIS — R2681 Unsteadiness on feet: Secondary | ICD-10-CM | POA: Diagnosis not present

## 2017-01-23 DIAGNOSIS — R29898 Other symptoms and signs involving the musculoskeletal system: Secondary | ICD-10-CM | POA: Insufficient documentation

## 2017-01-23 DIAGNOSIS — R278 Other lack of coordination: Secondary | ICD-10-CM | POA: Insufficient documentation

## 2017-01-23 DIAGNOSIS — R2689 Other abnormalities of gait and mobility: Secondary | ICD-10-CM

## 2017-01-23 DIAGNOSIS — R29818 Other symptoms and signs involving the nervous system: Secondary | ICD-10-CM

## 2017-01-23 DIAGNOSIS — R41841 Cognitive communication deficit: Secondary | ICD-10-CM | POA: Insufficient documentation

## 2017-01-23 DIAGNOSIS — R471 Dysarthria and anarthria: Secondary | ICD-10-CM | POA: Diagnosis not present

## 2017-01-23 NOTE — Therapy (Signed)
[facility] [address] [place], [place], [address] Phone: [phone]   Fax:  [phone]  Physical Therapy Treatment  Patient Details  Name: Nicholas [name] MRN: [id number] Date of Birth: 08/17/1945 Referring Provider: Alonza Bogus  Encounter Date: 01/23/2017      PT End of Session - 01/23/17 1821    Visit Number 2   Number of Visits 17   Date for PT Re-Evaluation 03/24/17   Authorization Type [employer] every 10th visit   Authorization Time Period 01/23/17  to 03/24/17   PT Start Time 1447   PT Stop Time 1520  pt requesting to end session early due to fatigue and another appt   PT Time Calculation (min) 33 min   Equipment Utilized During Treatment Gait belt   Activity Tolerance Patient limited by fatigue   Behavior During Therapy [facility] for tasks assessed/performed      Past Medical History:  Diagnosis Date  . Cancer [facility]) Jan 2008   skin; hx of melanoma left foot/ amputation of toes 1&2   . Hyperlipidemia   . Hypertension   . Lymphedema     Past Surgical History:  Procedure Laterality Date  . 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 removal of melanoma of left foot/amputation of toes 1&25 Jul 2006  . WRIST FRACTURE SURGERY     71 years old-set    There were no vitals filed for this visit.      Subjective Assessment - 01/23/17 1441    Subjective Has not fallen. Could not get PT appts since 12/19/16 (did cancel one appointment during that time). Reports he has not continued his PWR exercises after previous d/c from PT. The only exercise he is currently doing is one spin class per week.    Pertinent History lymphedema bilateral lower extremities, malignant melanoma lower leg s/p toe amputation L foot; obesity, CHF, OSA   Patient Stated Goals Pt's goal for therapy is to manage Parkinson's disease symptoms.   Currently in Pain? Yes   Pain Score 3    Pain Location Shoulder   Pain Orientation Right   Pain Descriptors / Indicators Aching   Pain Type Acute pain   Pain Onset More than a month ago                         [facility] Adult PT Treatment/Exercise - 01/23/17 1520      Transfers   Transfers Sit to Stand;Stand to Sit   Sit to Stand 6: Modified independent (Device/Increase time)   Stand to Sit 6: Modified independent (Device/Increase time)     Ambulation/Gait   Ambulation/Gait Assistance 4: Min guard;4: Min assist   Ambulation/Gait Assistance Details pt frequently catching rt>lt foot/toe while walking "it's these shoes" "well, maybe it's because I'm tired." Focused on heelstrike,focusing on a object on the other side of a doorway/narrow pass to prevent freezing, and wide turns.    Ambulation Distance (Feet) 500 Feet  seated rest, 200 ft   Assistive device None   Gait Pattern Step-through pattern;Decreased arm swing - right;Decreased arm swing - left;Decreased step length - right;Decreased step length - left;Festinating;Wide base of support;Poor foot clearance - left;Poor foot clearance - right   Ambulation Surface Level;Indoor   Gait Comments Despite discussing tips to prevent freezing prior to beginning gait, pt required verbal and visual cues to stop, pause, then rock and big step 90% of the times he began to festinate.  Requested seated rest x 2 due to fatigue.           PWR [facility]) - 01/23/17 1530    PWR! Up x 10   PWR! Rock x 10 (not reaching over shoulder height on the right due to rt shoulder pain); multi-modal cues for equal weight shift Lt and Rt    Comments pt was too fatigued to attempt other PWR exercises and requested to end early (reported he has 3 other appts this afternoon, and this was his 3rd therapy appt today).              PT Education - 01/23/17 1820    Education provided Yes   Education Details focusing on a target on the other side of a narrow passage or doorway to help prevent freezing   Person(s) Educated Patient   Methods  Explanation;Demonstration;Verbal cues   Comprehension Verbalized understanding;Returned demonstration;Need further instruction          PT Short Term Goals - 01/23/17 1839      PT SHORT TERM GOAL #1   Title Pt will be independent with HEP for improved balance, transfers and gait.  TARGET updated to 02/21/17.    Time 4   Period Weeks   Status New     PT SHORT TERM GOAL #2   Title Pt will improve TUG score to less than or equal to 13 seconds for decreased fall risk.   Time 4   Period Weeks   Status New     PT SHORT TERM GOAL #3   Title Pt wil perform at least 8 of 10 reps of sit<>stand transfers from 18 inch surfaces or below, without UE support, independently for improved transfer efficiency and safety.   Time 4   Period Weeks   Status New     PT SHORT TERM GOAL #4   Title Pt will verbalize undersatnding of techniques to reduce freezing episodes with gait and turns     PT SHORT TERM GOAL #5   Title Pt will improve FGA to at least 16/30 for decreased fall risk.   Time 4   Period Weeks   Status New           PT Long Term Goals - 01/23/17 1839      PT LONG TERM GOAL #1   Title Pt will verbalize understanding of fall prevention within the home environment.  TARGET updated 03/24/17   Time 8   Period Weeks   Status New     PT LONG TERM GOAL #2   Title Pt will improve TUG manual to less than or equal to 18 seconds for decreased fall risk/improved dual tasking with gait.   Time 8   Period Weeks   Status New     PT LONG TERM GOAL #3   Title Pt will improve gait velocity with conversation to at least 3 ft/sec for improved gait efficiency and safety.   Time 8   Period Weeks   Status New     PT LONG TERM GOAL #4   Title Pt will improve Functional Gait Assessment score to at least 19/30 for decreased fall risk.   Time 8   Period Weeks   Status New     PT LONG TERM GOAL #5   Title Pt will verbalize plans for optimal continued community fitness upon D/C from PT.    Time 8   Period Weeks   Status New  Plan - 01/23/17 1824    Clinical Impression Statement Patient reports delayed return to clinic due to lack of available appointments. Re-certification completed as his STGs were due to be checked 6/28 based on original certification and today was patient's first treatment visit after the evaluation on 12/19/16. All goals remain appropriate, however dates adjusted due to delayed start to PT POC. Patient was seen today after both SLP and OT sessions and late in the afternoon. He reported fatigue at beginning of session and required 2 seated rest breaks and ending session 10 minutes early. Will continue to benefit from PT to work towards [place] Potential Good   PT Frequency 2x / week   PT Duration 8 weeks  plus eval   PT Treatment/Interventions ADLs/Self Care Home Management;Functional mobility training;Gait training;DME Instruction;Patient/family education;Neuromuscular re-education;Balance training;Therapeutic exercise;Therapeutic activities   PT Next Visit Plan Initiate HEP including PWR! moves, weightshifting, turns, change of directions with gait.  Review techniques to reduce freezing with gait.   Consulted and Agree with Plan of Care Patient      Patient will benefit from skilled therapeutic intervention in order to improve the following deficits and impairments:  Abnormal gait, Decreased balance, Decreased mobility, Decreased strength, Difficulty walking, Postural dysfunction  Visit Diagnosis: Other symptoms and signs involving the nervous system  Other abnormalities of gait and mobility  Unsteadiness on feet     Problem List Patient Active Problem List   Diagnosis Date Noted  . BPH associated with nocturia 05/16/2016  . Senile purpura ([place]) 05/03/2016  . OSA (obstructive sleep apnea) 12/28/2015  . Hypersomnia 11/15/2015  . Cough 11/15/2015  . 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 Dizziness 12/30/2014  .  Diastolic CHF ([place]) 40/81/4481  . Former smoker 04/29/2014  . Chronic venous insufficiency 02/20/2014  . Hyperglycemia 08/02/2012  . Obesity 07/13/2010  . MALIGNANT MELANOMA SKIN LOWER LIMB INCLUDING HIP 03/09/2009  . Insomnia 10/03/2007  . Hyperlipidemia 12/13/2006  . Essential hypertension 12/13/2006    Rexanne Mano, PT 01/23/2017, 6:40 PM  [facility] [address] [place], [place], [address] Phone: [phone]   Fax:  [phone]  Name: CLEVEN JANSMA MRN: [id number] Date of Birth: 01/17/1946

## 2017-01-23 NOTE — Therapy (Signed)
[facility] [address] [place], [place], [address] Phone: [phone]   Fax:  [phone]  Occupational Therapy Treatment  Patient Details  Name: [name] MRN: [id number] Date of Birth: [birth year] Referring Provider: Dr. Carles Collet  Encounter Date: 01/23/2017      OT End of Session - 01/23/17 1705    Visit Number 3   Number of Visits 17   Date for OT Re-Evaluation 02/13/17   Authorization Type [employer], G code needed   Authorization Time Period 60 days- 02/13/17   Authorization - Visit Number 3   Authorization - Number of Visits 10   OT Start Time 1410   OT Stop Time 1448   OT Time Calculation (min) 38 min   Activity Tolerance Patient tolerated treatment well   Behavior During Therapy [facility] for tasks assessed/performed      Past Medical History:  Diagnosis Date  . Cancer [facility]) Jan 2008   skin; hx of melanoma left foot/ amputation of toes 1&2   . Hyperlipidemia   . Hypertension   . Lymphedema     Past Surgical History:  Procedure Laterality Date  . 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 removal of melanoma of left foot/amputation of toes 1&25 Jul 2006  . WRIST FRACTURE SURGERY     71 years old-set    There were no vitals filed for this visit.      Subjective Assessment - 01/23/17 1704    Subjective  Pt reports that he has been performing coordination HEP   Patient Stated Goals fluid movement, maintain independence   Currently in Pain? No/denies   Pain Onset More than a month ago        Dealing cards with thumb with mod cueing for R hand for large amplitude movement strategies, min cues for L hand.  Pt needed repetition for carryover.  In standing, functional step and reach to the side/diagonally with each UE/LE to flip large cards with focus large amplitude movements (mod cueing).   Practiced getting in out of bed and lowering himself into bed (quadraped>prone>supine) with min cueing for incr  safety/ease.  (high bed with step to get in).  Reviewed strategies to reduce freezing including rocking and PWR! Up, stop-restart.  Pt verbalized understanding, but needed min cueing to utilize during session.  Pt experienced freezing with initiation, small spaces, turns, and distractions.    Reviewed info for upcoming PD symposium.  Pt verbalized understanding.                     OT Short Term Goals - 01/23/17 1709      OT SHORT TERM GOAL #1   Title I with updated PD specific HEP- due 02/09/17   Time 4   Period Weeks   Status New     OT SHORT TERM GOAL #2   Title Pt will verbalize understanding of adapted strategies for ADLs/IADLS.   Time 4   Period Weeks   Status New     OT SHORT TERM GOAL #3   Title Pt will demonstrate improved standing balance for ADLs as evidenced by improving standing functional reach to 10 inches or greater for LUE.   Baseline RUE 10.5 inches, LUE 9 inches   Time 4   Period Weeks   Status New     OT SHORT TERM GOAL #4   Title Pt will demonstrate improved bilateral UE functional use for ADLs as evidenced by  increasing box/ blocks by 3 blocks.   Baseline RUE 46 blocks, LUe 47 blocks   Time 4   Period Weeks   Status New           OT Long Term Goals - 12/19/16 1515      OT LONG TERM GOAL #1   Title Pt will verbalize understanding of ways to prevent future PD related complications and verbalize understanding of community resources.   Time 8   Period Weeks   Status New     OT LONG TERM GOAL #2   Title Pt will demonstrate improved LUE fine motor coordination for ADLS as evidenced by decreasing LUE 9 hole peg test  to 29 secs or less.   Time 8   Period Weeks   Status New               Plan - 01/23/17 1706    Clinical Impression Statement Pt demo improved movement for functional tasks with cueing for large amplitude movement strategies. Will extend STGs through 02/09/17 as pt has not been seen frequency.   Rehab Potential  Good   Current Impairments/barriers affecting progress: freezing episodes   OT Frequency 2x / week   OT Duration 8 weeks   OT Treatment/Interventions Self-care/ADL training;DME and/or AE instruction;Patient/family education;Balance training;Therapeutic exercises;Therapeutic activities;Therapeutic exercise;Ultrasound;Neuromuscular education;Passive range of motion;Functional Mobility Training;Cognitive remediation/compensation;Visual/perceptual remediation/compensation;Manual Therapy;Energy conservation   Plan practice donning compression hose with AE, adaptive strategies for ADLs/IADLs   Consulted and Agree with Plan of Care Patient      Patient will benefit from skilled therapeutic intervention in order to improve the following deficits and impairments:  Abnormal gait, Decreased cognition, Decreased knowledge of use of DME, Increased edema, Impaired flexibility, Decreased mobility, Decreased coordination, Decreased activity tolerance, Decreased endurance, Decreased strength, Impaired tone, Impaired UE functional use, Decreased safety awareness, Decreased knowledge of precautions, Decreased balance  Visit Diagnosis: Other symptoms and signs involving the musculoskeletal system  Other symptoms and signs involving the nervous system  Other abnormalities of gait and mobility  Other lack of coordination  Unsteadiness on feet    Problem List Patient Active Problem List   Diagnosis Date Noted  . BPH associated with nocturia 05/16/2016  . Senile purpura ([place]) 05/03/2016  . OSA (obstructive sleep apnea) 12/28/2015  . Hypersomnia 11/15/2015  . Cough 11/15/2015  . 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 Dizziness 12/30/2014  . Diastolic CHF ([place]) 26/37/8588  . Former smoker 04/29/2014  . Chronic venous insufficiency 02/20/2014  . Hyperglycemia 08/02/2012  . Obesity 07/13/2010  . MALIGNANT MELANOMA SKIN LOWER LIMB INCLUDING HIP 03/09/2009  . Insomnia 10/03/2007  . Hyperlipidemia  12/13/2006  . Essential hypertension 12/13/2006    Meg Niemeier [facility] 01/23/2017, 5:09 PM  [facility] [address] [place], [place], [address] Phone: [phone]   Fax:  [phone]  Name: Nicholas [name] MRN: [id number] Date of Birth: 1945-12-25   Vianne Bulls, OTR/L [facility] [address] [place], [place]  [address] [phone] phone [phone] 01/23/17 5:09 PM

## 2017-01-23 NOTE — Therapy (Signed)
[facility] [address] [place], [place], [address] Phone: [phone]   Fax:  [phone]  Speech Language Pathology Treatment  Patient Details  Name: Nicholas [name] MRN: [id number] Date of Birth: 05/27/1946 Referring Provider: Alonza Bogus, DO  Encounter Date: 01/23/2017      End of Session - 01/23/17 1406    Visit Number 3   Number of Visits 17   Date for SLP Re-Evaluation 02/23/17   SLP Start Time 46   SLP Stop Time  1402   SLP Time Calculation (min) 44 min   Activity Tolerance Patient tolerated treatment well      Past Medical History:  Diagnosis Date  . Cancer [facility]) Jan 2008   skin; hx of melanoma left foot/ amputation of toes 1&2   . Hyperlipidemia   . Hypertension   . Lymphedema     Past Surgical History:  Procedure Laterality Date  . 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 removal of melanoma of left foot/amputation of toes 1&25 Jul 2006  . WRIST FRACTURE SURGERY     71 years old-set    There were no vitals filed for this visit.      Subjective Assessment - 01/23/17 1324    Subjective "Pretty good." (re: frequency with ah's) Pt reports he did "ah" and it scared his grandson (34 years old) and so he quit doing them.   Currently in Pain? No/denies               ADULT SLP TREATMENT - 01/23/17 1330      General Information   Behavior/Cognition Alert;Cooperative;Pleasant mood     Treatment Provided   Treatment provided Cognitive-Linquistic     Pain Assessment   Pain Assessment No/denies pain     Cognitive-Linquistic Treatment   Treatment focused on Dysarthria   Skilled Treatment Loud /a/ to recalibrate volume with average of 95dB and occasional min A for full breath. Simple strcutred speech task of phrase generation with average of 70dB, however volume did decrease as cognitive load increased. Speech task adding a sentence to a 3-sentence "story" with usual mod cues for loudness  necessary - average 68dB. In short simple conversation pt average loudness 67dB, iwth rare mod A to incr loudness.  and frequent mod visual and verbal cues for loudness.      Assessment / Recommendations / Plan   Plan Continue with current plan of care     Progression Toward Goals   Progression toward goals Progressing toward goals          SLP Education - 01/23/17 1405    Education provided Yes   Education Details feeling loud /a/ effort during conversation   Person(s) Educated Patient   Methods Explanation;Demonstration   Comprehension Verbalized understanding          SLP Short Term Goals - 01/23/17 1407      SLP SHORT TERM GOAL #1   Title pt will sustain loud /a/ at 94dB over 4 consecutive sessions   Time 3   Period Weeks   Status On-going     SLP SHORT TERM GOAL #2   Title pt will exhibit volume of 70dB in sentence responses 95%   Time 3   Period Weeks   Status On-going     SLP SHORT TERM GOAL #3   Title In 7 minutes simple conversation pt will achieve average speech volume of low 70s dB over two sessions   Time 3  Period Weeks   Status On-going          SLP Long Term Goals - 01/23/17 1407      SLP LONG TERM GOAL #1   Title pt will sustain loud /a/ at average 94dB over 4 consecutive sessions with SBA   Time 7   Period Weeks   Status On-going     SLP LONG TERM GOAL #2   Title pt will generate speech in 10 minutes mod complex conversation with average 70dB over three sessions   Time 7   Period Weeks   Status On-going          Plan - 01/23/17 1406    Clinical Impression Statement Pt required  frequent ongoing visual and verbal cues  to maintain 70dB in strutcutred tasks and simple conversation. Continue skilled ST to maximize intellgibility and compensations  cognition for independence and to reduce caregiver burden.   Speech Therapy Frequency 2x / week   Duration --  8 weeks   Treatment/Interventions Compensatory techniques;Internal/external  aids;SLP instruction and feedback;Functional tasks;Patient/family education;Cueing hierarchy;Environmental controls;Cognitive reorganization   Potential to Achieve Goals Good      Patient will benefit from skilled therapeutic intervention in order to improve the following deficits and impairments:   Cognitive communication deficit  Dysarthria and anarthria    Problem List Patient Active Problem List   Diagnosis Date Noted  . BPH associated with nocturia 05/16/2016  . Senile purpura ([place]) 05/03/2016  . OSA (obstructive sleep apnea) 12/28/2015  . Hypersomnia 11/15/2015  . Cough 11/15/2015  . 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 Dizziness 12/30/2014  . Diastolic CHF ([place]) 16/04/9603  . Former smoker 04/29/2014  . Chronic venous insufficiency 02/20/2014  . Hyperglycemia 08/02/2012  . Obesity 07/13/2010  . MALIGNANT MELANOMA SKIN LOWER LIMB INCLUDING HIP 03/09/2009  . Insomnia 10/03/2007  . Hyperlipidemia 12/13/2006  . Essential hypertension 12/13/2006    [facility] ,[place], [place]  01/23/2017, 2:08 PM  [facility] [address] [place], [place], [address] Phone: [phone]   Fax:  [phone]   Name: Nicholas [name] MRN: [id number] Date of Birth: 1946-05-20

## 2017-01-23 NOTE — Patient Instructions (Signed)
  Please complete the assigned speech therapy homework prior to your next session and return it to the speech therapist at your next visit.  

## 2017-01-25 ENCOUNTER — Encounter: Payer: PPO | Admitting: Occupational Therapy

## 2017-01-25 ENCOUNTER — Ambulatory Visit: Payer: PPO | Admitting: Physical Therapy

## 2017-01-29 ENCOUNTER — Ambulatory Visit: Payer: PPO | Admitting: Physical Therapy

## 2017-01-29 ENCOUNTER — Encounter: Payer: PPO | Admitting: Occupational Therapy

## 2017-02-01 ENCOUNTER — Encounter: Payer: PPO | Admitting: Occupational Therapy

## 2017-02-01 ENCOUNTER — Ambulatory Visit: Payer: PPO | Admitting: Physical Therapy

## 2017-02-01 ENCOUNTER — Ambulatory Visit: Payer: PPO | Admitting: Occupational Therapy

## 2017-02-01 ENCOUNTER — Ambulatory Visit: Payer: PPO

## 2017-02-01 DIAGNOSIS — R41841 Cognitive communication deficit: Secondary | ICD-10-CM

## 2017-02-01 DIAGNOSIS — R278 Other lack of coordination: Secondary | ICD-10-CM

## 2017-02-01 DIAGNOSIS — R29818 Other symptoms and signs involving the nervous system: Secondary | ICD-10-CM | POA: Diagnosis not present

## 2017-02-01 DIAGNOSIS — R2681 Unsteadiness on feet: Secondary | ICD-10-CM

## 2017-02-01 DIAGNOSIS — R2689 Other abnormalities of gait and mobility: Secondary | ICD-10-CM

## 2017-02-01 DIAGNOSIS — R471 Dysarthria and anarthria: Secondary | ICD-10-CM

## 2017-02-01 DIAGNOSIS — R29898 Other symptoms and signs involving the musculoskeletal system: Secondary | ICD-10-CM

## 2017-02-01 NOTE — Therapy (Signed)
[facility] [address] [place], [place], [address] Phone: [phone]   Fax:  [phone]  Occupational Therapy Treatment  Patient Details  Name: Nicholas [name] MRN: [id number] Date of Birth: Sep 30, 1945 Referring Provider: Dr. Carles Collet  Encounter Date: 02/01/2017      OT End of Session - 02/01/17 1324    Visit Number 4   Number of Visits 17   Date for OT Re-Evaluation 02/13/17   Authorization Type [employer], G code needed   Authorization Time Period 60 days- 02/13/17   Authorization - Visit Number 4   Authorization - Number of Visits 10   OT Start Time 1319   OT Stop Time 1400   OT Time Calculation (min) 41 min   Activity Tolerance Patient tolerated treatment well   Behavior During Therapy [facility] for tasks assessed/performed      Past Medical History:  Diagnosis Date  . Cancer [facility]) Jan 2008   skin; hx of melanoma left foot/ amputation of toes 1&2   . Hyperlipidemia   . Hypertension   . Lymphedema     Past Surgical History:  Procedure Laterality Date  . 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 removal of melanoma of left foot/amputation of toes 1&25 Jul 2006  . WRIST FRACTURE SURGERY     71 years old-set    There were no vitals filed for this visit.      Subjective Assessment - 02/01/17 1323    Subjective  tired, he just got out of cycle class   Patient Stated Goals fluid movement, maintain independence   Currently in Pain? Yes   Pain Score 4    Pain Location Arm   Pain Orientation Upper;Right   Pain Descriptors / Indicators Aching   Pain Type Acute pain   Pain Onset 1 to 4 weeks ago   Pain Frequency Intermittent   Aggravating Factors  movement, abduction   Pain Relieving Factors rest        PWR! Moves (up, twist) in sitting x 10-20 each with min cues For incr movement amplitude.  Twist with UEs in low range, encouraged incr head/eye movement.  Attempted rock but pt demo difficulty/pain with L  upper arm.  Sit>stand with feet staggered and apart to decr freezing, ambulating with turns during session with min-mod cueing for use of strategies, but decr freezing noted with strategies.  Pt reports that he did not bring in compression hose to try.  Coordination activities with focus on large amplitude movements with each UE:  Sliding/flipping cards across table with PWR! hands, dealing cards with thumb, flipping cards with min-mod cueing for large amplitude movements.  Functional reaching for cylinder objects with lateral wt. Shift in standing and incorporating trunk rotation.  Pt needed set-up and min cueing for large amplitude movements and PWR! Hands.                                  OT Short Term Goals - 01/23/17 1709      OT SHORT TERM GOAL #1   Title I with updated PD specific HEP- due 02/09/17   Time 4   Period Weeks   Status New     OT SHORT TERM GOAL #2   Title Pt will verbalize understanding of adapted strategies for ADLs/IADLS.   Time 4   Period Weeks   Status New     OT SHORT  TERM GOAL #3   Title Pt will demonstrate improved standing balance for ADLs as evidenced by improving standing functional reach to 10 inches or greater for LUE.   Baseline RUE 10.5 inches, LUE 9 inches   Time 4   Period Weeks   Status New     OT SHORT TERM GOAL #4   Title Pt will demonstrate improved bilateral UE functional use for ADLs as evidenced by  increasing box/ blocks by 3 blocks.   Baseline RUE 46 blocks, LUe 47 blocks   Time 4   Period Weeks   Status New           OT Long Term Goals - 12/19/16 1515      OT LONG TERM GOAL #1   Title Pt will verbalize understanding of ways to prevent future PD related complications and verbalize understanding of community resources.   Time 8   Period Weeks   Status New     OT LONG TERM GOAL #2   Title Pt will demonstrate improved LUE fine motor coordination for ADLS as evidenced by decreasing LUE 9 hole peg  test  to 29 secs or less.   Time 8   Period Weeks   Status New               Plan - 02/01/17 1418    Clinical Impression Statement Pt demo improved movement for functional tasks with cueing.  Pt continues to demo freezing with initiation and turns.     Rehab Potential Good   Current Impairments/barriers affecting progress: freezing episodes   OT Frequency 2x / week   OT Duration 8 weeks   OT Treatment/Interventions Self-care/ADL training;DME and/or AE instruction;Patient/family education;Balance training;Therapeutic exercises;Therapeutic activities;Therapeutic exercise;Ultrasound;Neuromuscular education;Passive range of motion;Functional Mobility Training;Cognitive remediation/compensation;Visual/perceptual remediation/compensation;Manual Therapy;Energy conservation   Plan ?supine PWR! (check on RUE pain), practice donning compression hose with AE if pt brings in; continue with strategies for ADLs/IADLs   Consulted and Agree with Plan of Care Patient      Patient will benefit from skilled therapeutic intervention in order to improve the following deficits and impairments:  Abnormal gait, Decreased cognition, Decreased knowledge of use of DME, Increased edema, Impaired flexibility, Decreased mobility, Decreased coordination, Decreased activity tolerance, Decreased endurance, Decreased strength, Impaired tone, Impaired UE functional use, Decreased safety awareness, Decreased knowledge of precautions, Decreased balance  Visit Diagnosis: Other symptoms and signs involving the musculoskeletal system  Other symptoms and signs involving the nervous system  Other lack of coordination  Other abnormalities of gait and mobility  Unsteadiness on feet    Problem List Patient Active Problem List   Diagnosis Date Noted  . BPH associated with nocturia 05/16/2016  . Senile purpura ([place]) 05/03/2016  . OSA (obstructive sleep apnea) 12/28/2015  . Hypersomnia 11/15/2015  . Cough 11/15/2015   . 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 Dizziness 12/30/2014  . Diastolic CHF ([place]) 37/62/8315  . Former smoker 04/29/2014  . Chronic venous insufficiency 02/20/2014  . Hyperglycemia 08/02/2012  . Obesity 07/13/2010  . MALIGNANT MELANOMA SKIN LOWER LIMB INCLUDING HIP 03/09/2009  . Insomnia 10/03/2007  . Hyperlipidemia 12/13/2006  . Essential hypertension 12/13/2006    [facility] 02/01/2017, 4:59 PM  [facility] [address] [place], [place], [address] Phone: [phone]   Fax:  [phone]  Name: VIRGILIO BROADHEAD MRN: [id number] Date of Birth: June 05, 1946   Vianne Bulls, OTR/L [facility] [address] [place], [place]  [address] [phone]  phone [phone] 02/01/17 4:59 PM

## 2017-02-01 NOTE — Patient Instructions (Signed)
Tips to reduce freezing episodes with standing or walking:  1. Stand tall with your feet wide, so that you can rock and weight shift through your hips. 2. Don't try to fight the freeze: if you begin taking slower, faster, smaller steps, STOP, get your posture tall, and RESET your posture and balance.  Take a deep breath before taking the BIG step to start again. 3. March in place, with high knee stepping, to get started walking again. 4. Use auditory cues:  Count out loud, think of a familiar tune or song or cadence, use pocket metronome, to use rhythm to get started walking again. 5. Use visual cues:  Use a line to step over, use laser pointer line to step over, (using BIG steps) to start walking again. 6. Use visual targets to keep your posture tall (look ahead and focus on an object or target at eye level).-especially when going through narrow spaces 7. As you approach where your destination with walking, count your steps out loud and/or focus on your target with your eyes until you are fully there. 8.  Use appropriate assistive device, as advised by your physical therapist to assist with taking longer, consistent steps.- Consider using your cane or walking pole to help with crowded areas

## 2017-02-01 NOTE — Patient Instructions (Signed)
  Please complete the assigned speech therapy homework prior to your next session and return it to the speech therapist at your next visit.  

## 2017-02-01 NOTE — Therapy (Signed)
[facility] [address] [place], [place], [address] Phone: [phone]   Fax:  [phone]  Speech Language Pathology Treatment  Patient Details  Name: Nicholas [name] MRN: [id number] Date of Birth: 1945-12-22 Referring Provider: Alonza Bogus, DO  Encounter Date: 02/01/2017      End of Session - 02/01/17 1555    Visit Number 4   Number of Visits 17   Date for SLP Re-Evaluation 02/23/17   SLP Start Time 7510   SLP Stop Time  1445   SLP Time Calculation (min) 42 min   Activity Tolerance Patient tolerated treatment well      Past Medical History:  Diagnosis Date  . 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 Hyperlipidemia   . Hypertension   . Lymphedema     Past Surgical History:  Procedure Laterality Date  . 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 removal of melanoma of left foot/amputation of toes 1&25 Jul 2006  . WRIST FRACTURE SURGERY     71 years old-set    There were no vitals filed for this visit.      Subjective Assessment - 02/01/17 1410    Subjective Pt arrives from OT.               ADULT SLP TREATMENT - 02/01/17 1418      General Information   Behavior/Cognition Alert;Cooperative;Pleasant mood     Treatment Provided   Treatment provided Cognitive-Linquistic     Pain Assessment   Pain Assessment No/denies pain     Cognitive-Linquistic Treatment   Treatment focused on Dysarthria   Skilled Treatment Loud /a/ average 90dB in order to recalibrate conversational loudness to WNL. In simple conversation pt maintained average volume of upper 60s dB, with rare min A for loudness. Pt stated it was harder to think of content and loudness simultaneously. SLP agreed as pt reviewed cards and told SLP contents of each card, volume suffered - average mid to upper 60s. SLP endorsed pt's cognitive deficits today with lack of attention to details with  sequencing cards adn dificulty wiht ordering the cards.     Assessment / Recommendations / Plan   Plan Continue with current plan of care     Progression Toward Goals   Progression toward goals Progressing toward goals            SLP Short Term Goals - 02/01/17 1602      SLP SHORT TERM GOAL #1   Title pt will sustain loud /a/ at 94dB over 4 consecutive sessions   Time 3   Period Weeks   Status On-going     SLP SHORT TERM GOAL #2   Title pt will exhibit volume of 70dB in sentence responses 95%   Time 3   Period Weeks   Status On-going     SLP SHORT TERM GOAL #3   Title In 7 minutes simple conversation pt will achieve average speech volume of low 70s dB over two sessions   Time 3   Period Weeks   Status On-going          SLP Long Term Goals - 02/01/17 1602      SLP LONG TERM GOAL #1   Title pt will sustain loud /a/ at average 94dB over 4 consecutive sessions with SBA   Time 7   Period Weeks   Status On-going  SLP LONG TERM GOAL #2   Title pt will generate speech in 10 minutes mod complex conversation with average 70dB over three sessions   Time 7   Period Weeks   Status On-going          Plan - 02/01/17 1555    Clinical Impression Statement Pt required frequent ongoing verbal cues for simple sequencing, and endorsed today the challenge of thinking of content and also improving his loudness. Continue skilled ST to maximize intellgibility and compensations  cognition for independence and to reduce caregiver burden.   Speech Therapy Frequency 2x / week   Duration --  8 weeks   Treatment/Interventions Compensatory techniques;Internal/external aids;SLP instruction and feedback;Functional tasks;Patient/family education;Cueing hierarchy;Environmental controls;Cognitive reorganization   Potential to Achieve Goals Good      Patient will benefit from skilled therapeutic intervention in order to improve the following deficits and impairments:   Dysarthria and  anarthria  Cognitive communication deficit    Problem List Patient Active Problem List   Diagnosis Date Noted  . BPH associated with nocturia 05/16/2016  . Senile purpura ([place]) 05/03/2016  . OSA (obstructive sleep apnea) 12/28/2015  . Hypersomnia 11/15/2015  . Cough 11/15/2015  . 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 Dizziness 12/30/2014  . Diastolic CHF ([place]) 84/53/6468  . Former smoker 04/29/2014  . Chronic venous insufficiency 02/20/2014  . Hyperglycemia 08/02/2012  . Obesity 07/13/2010  . MALIGNANT MELANOMA SKIN LOWER LIMB INCLUDING HIP 03/09/2009  . Insomnia 10/03/2007  . Hyperlipidemia 12/13/2006  . Essential hypertension 12/13/2006    [facility] ,[place], [place]  02/01/2017, 4:03 PM  [facility] [address] [place], [place], [address] Phone: [phone]   Fax:  [phone]   Name: Nicholas [name] MRN: [id number] Date of Birth: 17-Nov-1945

## 2017-02-02 NOTE — Therapy (Signed)
[facility] [address] [place], [place], [address] Phone: [phone]   Fax:  [phone]  Physical Therapy Treatment  Patient Details  Name: Nicholas [name] MRN: [id number] Date of Birth: 1945/09/25 Referring Provider: Alonza Bogus  Encounter Date: 02/01/2017      PT End of Session - 02/02/17 1453    Visit Number 3   Number of Visits 17   Date for PT Re-Evaluation 03/24/17   Authorization Type [employer] every 10th visit   Authorization Time Period 01/23/17  to 03/24/17   PT Start Time 1448   PT Stop Time 1530   PT Time Calculation (min) 42 min   Equipment Utilized During Treatment Gait belt   Activity Tolerance Patient limited by fatigue   Behavior During Therapy [facility] for tasks assessed/performed      Past Medical History:  Diagnosis Date  . 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 Hyperlipidemia   . Hypertension   . Lymphedema     Past Surgical History:  Procedure Laterality Date  . 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 removal of melanoma of left foot/amputation of toes 1&25 Jul 2006  . WRIST FRACTURE SURGERY     71 years old-set    There were no vitals filed for this visit.      Subjective Assessment - 02/01/17 1453    Subjective No falls.  Still have a hard time in crowds and tight spots.   Patient Stated Goals Pt's goal for therapy is to manage Parkinson's disease symptoms.   Currently in Pain? No/denies                         [facility] Adult PT Treatment/Exercise - 02/01/17 1516      Transfers   Transfers Sit to Stand;Stand to Sit   Sit to Stand 6: Modified independent (Device/Increase time);Without upper extremity assist;From bed;From chair/3-in-1   Stand to Sit 5: Supervision;To bed;To chair/3-in-1;Uncontrolled descent  Cues to control descent into sitting   Number of Reps --  At least 10 reps throughout PT session   Transfer Cueing Cues provided with turning to sit to widen BOS for improved weightshift/foot clearance with turn.y     Ambulation/Gait   Ambulation/Gait Yes   Ambulation/Gait Assistance 4: Min guard;4: Min assist   Ambulation/Gait Assistance Details Practiced gait activities with quick change of directions, turns, narrowed spaces, with pt requiring cues to reduce festinating episodes.  Cues provided for upright posture, widened BOS for weightshifting to initiate gait, use of visual target for narrowed spaces.     Ambulation Distance (Feet) 200 Feet  x 2, then 150 ft x 2; then 40 ft x 4 reps   Assistive device None  single walking pole   Gait Pattern Step-through pattern;Decreased arm swing - right;Decreased arm swing - left;Decreased step length - right;Decreased step length - left;Festinating;Wide base of support;Poor foot clearance - left;Poor foot clearance - right   Ambulation Surface Level;Indoor   Gait Comments Even with cues provided before and during gait activities, pt continues to have festination with quick stops, change of directions, narrow spaces.  Trial of gait using single walking pole, to provide additional point of stability for gait.  With short distance gait narrowed spaces and turns using walking pole, pt able to adjust out of festinating episode more quickly.  PWR [facility]) - 02/02/17 1448    PWR! exercises Moves in standing   PWR! Up x 10   PWR! Rock x 10 reps each side, not full reach on R UE due to shoulder pain.  Cues for increased lateral weightshifting;  second set with lateral weightshift and lift for improved SLS   PWR Step x 10 reps each side, cues for increased foot clearance   Comments Pt fatigued during standing exercises and needs frequent cues for technique, intensity/amplitude of movement and to finish set of 10 reps of each exercise.               PT Short Term Goals - 01/23/17 1839      PT SHORT TERM GOAL #1   Title Pt will be  independent with HEP for improved balance, transfers and gait.  TARGET updated to 02/21/17.    Time 4   Period Weeks   Status New     PT SHORT TERM GOAL #2   Title Pt will improve TUG score to less than or equal to 13 seconds for decreased fall risk.   Time 4   Period Weeks   Status New     PT SHORT TERM GOAL #3   Title Pt wil perform at least 8 of 10 reps of sit<>stand transfers from 18 inch surfaces or below, without UE support, independently for improved transfer efficiency and safety.   Time 4   Period Weeks   Status New     PT SHORT TERM GOAL #4   Title Pt will verbalize undersatnding of techniques to reduce freezing episodes with gait and turns     PT SHORT TERM GOAL #5   Title Pt will improve FGA to at least 16/30 for decreased fall risk.   Time 4   Period Weeks   Status New           PT Long Term Goals - 01/23/17 1839      PT LONG TERM GOAL #1   Title Pt will verbalize understanding of fall prevention within the home environment.  TARGET updated 03/24/17   Time 8   Period Weeks   Status New     PT LONG TERM GOAL #2   Title Pt will improve TUG manual to less than or equal to 18 seconds for decreased fall risk/improved dual tasking with gait.   Time 8   Period Weeks   Status New     PT LONG TERM GOAL #3   Title Pt will improve gait velocity with conversation to at least 3 ft/sec for improved gait efficiency and safety.   Time 8   Period Weeks   Status New     PT LONG TERM GOAL #4   Title Pt will improve Functional Gait Assessment score to at least 19/30 for decreased fall risk.   Time 8   Period Weeks   Status New     PT LONG TERM GOAL #5   Title Pt will verbalize plans for optimal continued community fitness upon D/C from PT.   Time 8   Period Weeks   Status New               Plan - 02/02/17 1454    Clinical Impression Statement Pt reports fatigue today during session as well, needing various cues for completion of sets of exercises and gait  activities.  Pt's festination episodes are shortened and pt appears to have improved steadiness with single walking pole.  Encouraged  patient to use single walking pole or cane to improved steadiness and confidence with gait.   Rehab Potential Good   PT Frequency 2x / week   PT Duration 8 weeks  plus eval   PT Treatment/Interventions ADLs/Self Care Home Management;Functional mobility training;Gait training;DME Instruction;Patient/family education;Neuromuscular re-education;Balance training;Therapeutic exercise;Therapeutic activities   PT Next Visit Plan Initiate HEP including PWR! moves, weightshifting, turns, change of directions with gait.  Review techniques to reduce freezing with gait; trial again with walking pole   Consulted and Agree with Plan of Care Patient      Patient will benefit from skilled therapeutic intervention in order to improve the following deficits and impairments:  Abnormal gait, Decreased balance, Decreased mobility, Decreased strength, Difficulty walking, Postural dysfunction  Visit Diagnosis: Other abnormalities of gait and mobility  Unsteadiness on feet     Problem List Patient Active Problem List   Diagnosis Date Noted  . BPH associated with nocturia 05/16/2016  . Senile purpura ([place]) 05/03/2016  . OSA (obstructive sleep apnea) 12/28/2015  . Hypersomnia 11/15/2015  . Cough 11/15/2015  . 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 Dizziness 12/30/2014  . Diastolic CHF ([place]) 07/86/7544  . Former smoker 04/29/2014  . Chronic venous insufficiency 02/20/2014  . Hyperglycemia 08/02/2012  . Obesity 07/13/2010  . MALIGNANT MELANOMA SKIN LOWER LIMB INCLUDING HIP 03/09/2009  . Insomnia 10/03/2007  . Hyperlipidemia 12/13/2006  . Essential hypertension 12/13/2006    Sadie Pickar W. 02/02/2017, 2:57 PM  [name]., PT   [facility] [address] [place], [place], [address] Phone:  [phone]   Fax:  [phone]  Name: Nicholas [name] MRN: [id number] Date of Birth: 1945-12-28

## 2017-02-06 ENCOUNTER — Ambulatory Visit: Payer: PPO | Admitting: Physical Therapy

## 2017-02-06 ENCOUNTER — Ambulatory Visit: Payer: PPO | Admitting: Occupational Therapy

## 2017-02-06 ENCOUNTER — Ambulatory Visit: Payer: PPO

## 2017-02-06 DIAGNOSIS — R278 Other lack of coordination: Secondary | ICD-10-CM

## 2017-02-06 DIAGNOSIS — R2689 Other abnormalities of gait and mobility: Secondary | ICD-10-CM

## 2017-02-06 DIAGNOSIS — R471 Dysarthria and anarthria: Secondary | ICD-10-CM

## 2017-02-06 DIAGNOSIS — R2681 Unsteadiness on feet: Secondary | ICD-10-CM

## 2017-02-06 DIAGNOSIS — R29898 Other symptoms and signs involving the musculoskeletal system: Secondary | ICD-10-CM

## 2017-02-06 DIAGNOSIS — R41841 Cognitive communication deficit: Secondary | ICD-10-CM

## 2017-02-06 DIAGNOSIS — R29818 Other symptoms and signs involving the nervous system: Secondary | ICD-10-CM

## 2017-02-06 NOTE — Progress Notes (Signed)
Pre visit review using our clinic review tool, if applicable. No additional management support is needed unless otherwise documented below in the visit note. 

## 2017-02-06 NOTE — Therapy (Signed)
[facility] [address] [place], [place], [address] Phone: [phone]   Fax:  [phone]  Speech Language Pathology Treatment  Patient Details  Name: Nicholas [name] MRN: [id number] Date of Birth: April 19, 1946 Referring Provider: Alonza Bogus, DO  Encounter Date: 02/06/2017      End of Session - 02/06/17 1234    Visit Number 5   Number of Visits 17   Date for SLP Re-Evaluation 02/23/17   SLP Start Time 1148   SLP Stop Time  1210   SLP Time Calculation (min) 22 min   Activity Tolerance Patient tolerated treatment well      Past Medical History:  Diagnosis Date  . Cancer [facility]) Jan 2008   skin; hx of melanoma left foot/ amputation of toes 1&2   . Hyperlipidemia   . Hypertension   . Lymphedema     Past Surgical History:  Procedure Laterality Date  . 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 removal of melanoma of left foot/amputation of toes 1&25 Jul 2006  . WRIST FRACTURE SURGERY     71 years old-set    There were no vitals filed for this visit.      Subjective Assessment - 02/06/17 1150    Subjective "I really enjoyed Friday." Pt reports having to leave at 12:10 today.   Currently in Pain? No/denies               ADULT SLP TREATMENT - 02/06/17 1151      General Information   Behavior/Cognition Alert;Cooperative;Pleasant mood     Treatment Provided   Treatment provided Cognitive-Linquistic     Cognitive-Linquistic Treatment   Treatment focused on Dysarthria   Skilled Treatment Loud /a/ average 90dB in order to recalibrate conversational loudness to WNL. Initially usual cues were needed for full breath, faded to rare cues. Pt asked SLP why decr'd loudness/aphonia occurred in dinner meeting and SLP educated pt re: muscle memory discussed in Parkinson's symposium last week - pt's muscle memory has not occurred for louder/WNL voice volume. SLP stressed pt's completion of homework is necessary to  provide practice for this to occur. In this mod-complex conversation about loudness pt maintained average volume of upper 60s     Assessment / Recommendations / Plan   Plan Continue with current plan of care     Progression Toward Goals   Progression toward goals Progressing toward goals          SLP Education - 02/06/17 1234    Education provided Yes   Education Details muscle  memory for WNL loudness speech   Person(s) Educated Patient   Methods Explanation   Comprehension Verbalized understanding          SLP Short Term Goals - 02/06/17 1235      SLP SHORT TERM GOAL #1   Title pt will sustain loud /a/ at 94dB over 4 consecutive sessions   Time 2   Period Weeks   Status On-going     SLP SHORT TERM GOAL #2   Title pt will exhibit volume of 70dB in sentence responses 95%   Time 2   Period Weeks   Status On-going     SLP SHORT TERM GOAL #3   Title In 7 minutes simple conversation pt will achieve average speech volume of low 70s dB over two sessions   Time 2   Period Weeks   Status On-going          SLP  Long Term Goals - 02/06/17 1236      SLP LONG TERM GOAL #1   Title pt will sustain loud /a/ at average 94dB over 4 consecutive sessions with SBA   Time 6   Period Weeks   Status On-going     SLP LONG TERM GOAL #2   Title pt will generate speech in 10 minutes mod complex conversation with average 70dB over three sessions   Time 6   Period Weeks   Status On-going          Plan - 02/06/17 1235    Clinical Impression Statement Pt becoming more aware of need to take speech homework seriously due to awareness of incr'd difficulty with speech volume. Continue skilled ST to maximize intellgibility and compensations  cognition for independence and to reduce caregiver burden.   Speech Therapy Frequency 2x / week   Duration --  8 weeks   Treatment/Interventions Compensatory techniques;Internal/external aids;SLP instruction and feedback;Functional  tasks;Patient/family education;Cueing hierarchy;Environmental controls;Cognitive reorganization   Potential to Achieve Goals Good      Patient will benefit from skilled therapeutic intervention in order to improve the following deficits and impairments:   Dysarthria and anarthria  Cognitive communication deficit    Problem List Patient Active Problem List   Diagnosis Date Noted  . BPH associated with nocturia 05/16/2016  . Senile purpura ([place]) 05/03/2016  . OSA (obstructive sleep apnea) 12/28/2015  . Hypersomnia 11/15/2015  . Cough 11/15/2015  . 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 Dizziness 12/30/2014  . Diastolic CHF ([place]) 87/56/4332  . Former smoker 04/29/2014  . Chronic venous insufficiency 02/20/2014  . Hyperglycemia 08/02/2012  . Obesity 07/13/2010  . MALIGNANT MELANOMA SKIN LOWER LIMB INCLUDING HIP 03/09/2009  . Insomnia 10/03/2007  . Hyperlipidemia 12/13/2006  . Essential hypertension 12/13/2006    [facility] ,[place], [place]  02/06/2017, 12:37 PM  [facility] [address] [place], [place], [address] Phone: [phone]   Fax:  [phone]   Name: Nicholas [name] MRN: [id number] Date of Birth: 10/06/1945

## 2017-02-06 NOTE — Therapy (Signed)
[facility] [address] [place], [place], [address] Phone: [phone]   Fax:  [phone]  Occupational Therapy Treatment  Patient Details  Name: Nicholas [name] MRN: [id number] Date of Birth: 12/10/1945 Referring Provider: Dr. Carles Collet  Encounter Date: 02/06/2017      OT End of Session - 02/06/17 1028    Visit Number 5   Number of Visits 17   Date for OT Re-Evaluation 02/13/17   Authorization Type [employer], G code needed   Authorization Time Period 60 days- 02/13/17   Authorization - Visit Number 5   Authorization - Number of Visits 10   OT Start Time 9449   OT Stop Time 1101   OT Time Calculation (min) 38 min   Activity Tolerance Patient tolerated treatment well   Behavior During Therapy [facility] for tasks assessed/performed      Past Medical History:  Diagnosis Date  . 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 Hyperlipidemia   . Hypertension   . Lymphedema     Past Surgical History:  Procedure Laterality Date  . 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 removal of melanoma of left foot/amputation of toes 1&25 Jul 2006  . WRIST FRACTURE SURGERY     71 years old-set    There were no vitals filed for this visit.      Subjective Assessment - 02/06/17 1025    Subjective  The symposium was great Friday.  I can't multi-task anymore, if I try to think about something else I freeze.   Patient Stated Goals fluid movement, maintain independence   Currently in Pain? No/denies   Pain Onset 1 to 4 weeks ago         Neuro re-ed:  PWR! Moves (basic 4) in supine x 20 each with min cues For incr movement amplitude/technique.  In sitting, stretching in prep for LB dressing including reaching to feet, lifting LE and reaching toward feet with forward lean with each UE.  Also performed with RLE on footstool.  Pt instructed in safety/strategies for dressing:  showed pt stocking  aide and discussed use--recommended pt bring in his current aide and stocking for RLE, instructed pt to don pants in sitting (currently standing and leaning against something) and recommended use of large amplitude movements and use of step stool prn.  Recommended pt use only actual grab bars in the shower (and not shower heads or towel bar) and recommended nonslip rug outside shower to decr risk of fall.  Pt verbalized understanding.                              OT Short Term Goals - 01/23/17 1709      OT SHORT TERM GOAL #1   Title I with updated PD specific HEP- due 02/09/17   Time 4   Period Weeks   Status New     OT SHORT TERM GOAL #2   Title Pt will verbalize understanding of adapted strategies for ADLs/IADLS.   Time 4   Period Weeks   Status New     OT SHORT TERM GOAL #3   Title Pt will demonstrate improved standing balance for ADLs as evidenced by improving standing functional reach to 10 inches or greater for LUE.   Baseline RUE 10.5 inches, LUE 9 inches   Time 4   Period Weeks  Status New     OT SHORT TERM GOAL #4   Title Pt will demonstrate improved bilateral UE functional use for ADLs as evidenced by  increasing box/ blocks by 3 blocks.   Baseline RUE 46 blocks, LUe 47 blocks   Time 4   Period Weeks   Status New           OT Long Term Goals - 12/19/16 1515      OT LONG TERM GOAL #1   Title Pt will verbalize understanding of ways to prevent future PD related complications and verbalize understanding of community resources.   Time 8   Period Weeks   Status New     OT LONG TERM GOAL #2   Title Pt will demonstrate improved LUE fine motor coordination for ADLS as evidenced by decreasing LUE 9 hole peg test  to 29 secs or less.   Time 8   Period Weeks   Status New               Plan - 02/06/17 1515    Clinical Impression Statement Pt did not bring compression stocking for practice.  pt verbalized understanding of safety  strategies for ADLs after instruction.  No RUE pain reported today.   Rehab Potential Good   Current Impairments/barriers affecting progress: freezing episodes   OT Frequency 2x / week   OT Duration 8 weeks   OT Treatment/Interventions Self-care/ADL training;DME and/or AE instruction;Patient/family education;Balance training;Therapeutic exercises;Therapeutic activities;Therapeutic exercise;Ultrasound;Neuromuscular education;Passive range of motion;Functional Mobility Training;Cognitive remediation/compensation;Visual/perceptual remediation/compensation;Manual Therapy;Energy conservation   Plan practice donning compression hose with AE if pt brings in; continue with strategies for ADLs/IADLs (issue handout)   Consulted and Agree with Plan of Care Patient      Patient will benefit from skilled therapeutic intervention in order to improve the following deficits and impairments:  Abnormal gait, Decreased cognition, Decreased knowledge of use of DME, Increased edema, Impaired flexibility, Decreased mobility, Decreased coordination, Decreased activity tolerance, Decreased endurance, Decreased strength, Impaired tone, Impaired UE functional use, Decreased safety awareness, Decreased knowledge of precautions, Decreased balance  Visit Diagnosis: Other symptoms and signs involving the nervous system  Other symptoms and signs involving the musculoskeletal system  Other lack of coordination  Other abnormalities of gait and mobility  Unsteadiness on feet    Problem List Patient Active Problem List   Diagnosis Date Noted  . BPH associated with nocturia 05/16/2016  . Senile purpura ([place]) 05/03/2016  . OSA (obstructive sleep apnea) 12/28/2015  . Hypersomnia 11/15/2015  . Cough 11/15/2015  . 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 Dizziness 12/30/2014  . Diastolic CHF ([place]) 05/23/5944  . Former smoker 04/29/2014  . Chronic venous insufficiency 02/20/2014  . Hyperglycemia 08/02/2012  .  Obesity 07/13/2010  . MALIGNANT MELANOMA SKIN LOWER LIMB INCLUDING HIP 03/09/2009  . Insomnia 10/03/2007  . Hyperlipidemia 12/13/2006  . Essential hypertension 12/13/2006    [facility] 02/06/2017, 3:17 PM  [facility] [address] [place], [place], [address] Phone: [phone]   Fax:  [phone]  Name: CHIDERA THIVIERGE MRN: [id number] Date of Birth: 28-Jul-1945   Vianne Bulls, OTR/L [facility] [address] [place], [place]  [address] [phone] phone [phone] 02/06/17 3:17 PM

## 2017-02-06 NOTE — Patient Instructions (Addendum)
   Turning in Place: Solid Surface    Standing in place, lead with head and turn slowly making quarter turns toward left. Repeat _3___ times per session clockwise; then 3 times counterclockwise. Do __1-2__ sessions per day.   Copyright  VHI. All rights reserved.   Provided PWR! Moves handout for lateral weightshifting for weightshifting turns, 3 reps clockwise and counterclockwise.

## 2017-02-06 NOTE — Progress Notes (Signed)
Subjective:   Nicholas [name] is a 71 y.o. male who presents for an Initial Medicare Annual Wellness Visit.  Review of Systems  No ROS.  Medicare Wellness Visit. Additional risk factors are reflected in the social history.  Cardiac Risk Factors include: advanced age (>86men, >74 women) Sleep patterns: Wears CPAP. Takes benadryl before bed. States he gets up to urinate at night and he wants to talk to Dr Yong Channel about a medication for this at his next visit. 7-8 hrs/night. Feels rested when he wakes up. Home Safety/Smoke Alarms: Feels safe in home. Smoke alarms in place.  Living environment; residence and Firearm Safety: Two story home. Master bedroom downstairs.  Seat Belt Safety/Bike Helmet: Wears seat belt.   Counseling:   Dental- Every 3 months. Dr [name].   Male:   CCS-    04/09/2009 10 year recall. PSA-  Lab Results  Component Value Date   PSA 0.58 08/25/2013   PSA 0.48 04/24/2012      Objective:    Today's Vitals   02/07/17 1448  BP: 118/72  Pulse: 91  Resp: 16  SpO2: 99%  Weight: 232 lb 1.6 oz (105.3 kg)  Height: 5\' 7"  (1.702 m)   Body mass index is 36.35 kg/m.  Current Medications (verified) Outpatient Encounter Prescriptions as of 02/07/2017  Medication Sig  . carbidopa-levodopa (SINEMET IR) 25-100 MG tablet Take by mouth See admin instructions. 1.5 tab in the morning, 1.5 tab in the afternoon, 1 tab in the evening  . diphenhydrAMINE (BENADRYL) 25 mg capsule Take 25 mg by mouth at bedtime.  . rosuvastatin (CRESTOR) 10 MG tablet TAKE 1 TABLET ONCE DAILY.   No facility-administered encounter medications on file as of 02/07/2017.     Allergies (verified) Amoxicillin   History: Past Medical History:  Diagnosis Date  . 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 Hyperlipidemia   . Hypertension   . Lymphedema    Past Surgical History:  Procedure Laterality Date  . 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  removal of melanoma of left foot/amputation of toes 1&25 Jul 2006  . WRIST FRACTURE SURGERY     71 years old-set   Family History  Problem Relation Age of Onset  . Hypertension Father   . CVA Father        age 74  . Hyperlipidemia Father   . Other Mother        Deceased  . Healthy Sister   . Healthy Brother    Social History   Occupational History  . retired     Engineer, maintenance (IT)   Social History Main Topics  . Smoking status: Former Smoker    Packs/day: 1.00    Years: 16.00    Types: Cigarettes    Quit date: 12/22/1993  . Smokeless tobacco: Never Used  . Alcohol use No     Comment: one time every few months now  . Drug use: No  . Sexual activity: Not on file   Tobacco Counseling Counseling given: Not Answered   Activities of Daily Living In your present state of health, do you have any difficulty performing the following activities: 02/07/2017  Hearing? N  Vision? N  Difficulty concentrating or making decisions? Y  Walking or climbing stairs? N  Dressing or bathing? N  Doing errands, shopping? N  Preparing Food and eating ? N  Using the Toilet? N  In the past  six months, have you accidently leaked urine? Y  Do you have problems with loss of bowel control? Y  Managing your Medications? N  Managing your Finances? N  Housekeeping or managing your Housekeeping? N  Some recent data might be hidden    Immunizations and Health Maintenance Immunization History  Administered Date(s) Administered  . Influenza Split 04/24/2011, 04/24/2012  . Influenza Whole 05/19/2008, 04/15/2010  . Influenza, High Dose Seasonal PF 05/19/2013, 05/03/2016  . Influenza,inj,Quad PF,36+ Mos 04/29/2014, 05/04/2015  . Pneumococcal Conjugate-13 12/30/2014  . Pneumococcal Polysaccharide-23 04/24/2011  . Td 07/24/2001  . Tdap 08/02/2012  . Zoster 04/24/2011   There are no preventive care reminders to display for this patient.  Patient Care Team: [name], MD as PCP - General (Family  Medicine)  Indicate any recent Medical Services you may have received from other than [facility] providers in the past year (date may be approximate).    Assessment:   This is a routine wellness examination for [facility]. Physical assessment deferred to PCP.  Hearing/Vision screen Hearing Screening Comments: Able to hear conversational tones w/o difficulty. No issues reported.   Vision Screening Comments: Wears glasses for distance. Last visit was 1.5 years ago.   Dietary issues and exercise activities discussed:   Diet (meal preparation, eat out, water intake, caffeinated beverages, dairy products, fruits and vegetables): Supposed to drink 72 oz of water/day. Usually only drinks 3 bottles/day.  Breakfast: Berniece Salines and toast. Lunch: Hamburger and french fries. Dinner: Usually eats out.     Goals    . Maintain current health status.       Depression Screen PHQ 2/9 Scores 02/07/2017 05/03/2016 12/30/2014  PHQ - 2 Score 0 0 0    Fall Risk Fall Risk  02/07/2017 11/28/2016 07/31/2016 05/03/2016 02/29/2016  Falls in the past year? No No No No No    Cognitive Function:   Ad8 score reviewed for issues:  Issues making decisions:no  Less interest in hobbies / activities:no  Repeats questions, stories (family complaining):no  Trouble using ordinary gadgets (microwave, computer, phone):no  Forgets the month or year: no  Mismanaging finances: no  Remembering appts:no  Daily problems with thinking and/or memory:no Ad8 score is=0      Screening Tests Health Maintenance  Topic Date Due  . Hepatitis C Screening  07/21/2098 (Originally [birth year])  . INFLUENZA VACCINE  02/21/2017  . COLONOSCOPY  04/10/2019  . TETANUS/TDAP  08/02/2022  . PNA vac Low Risk Adult  Completed        Plan:    Schedule your eye exam. Bring a copy of your advance directives to your next office visit. Check your dose of Benadryl prior to Friday's appointment. Discuss your overactive bladder at night with Dr  Yong Channel.  I have personally reviewed and noted the following in the patient's chart:   . Medical and social history . Use of alcohol, tobacco or illicit drugs  . Current medications and supplements . Functional ability and status . Nutritional status . Physical activity . Advanced directives . List of other physicians . Vitals . Screenings to include cognitive, depression, and falls . Referrals and appointments  In addition, I have reviewed and discussed with patient certain preventive protocols, quality metrics, and best practice recommendations. A written personalized care plan for preventive services as well as general preventive health recommendations were provided to patient.     Ree Edman, RN   02/07/2017

## 2017-02-07 ENCOUNTER — Ambulatory Visit (INDEPENDENT_AMBULATORY_CARE_PROVIDER_SITE_OTHER): Payer: PPO

## 2017-02-07 VITALS — BP 118/72 | HR 91 | Resp 16 | Ht 67.0 in | Wt 232.1 lb

## 2017-02-07 DIAGNOSIS — Z0000 Encounter for general adult medical examination without abnormal findings: Secondary | ICD-10-CM | POA: Diagnosis not present

## 2017-02-07 NOTE — Progress Notes (Signed)
I have reviewed and agree with note, evaluation, plan.   Gerrell Tabet, MD  

## 2017-02-07 NOTE — Therapy (Signed)
[facility] [address] [place], [place], [address] Phone: [phone]   Fax:  [phone]  Physical Therapy Treatment  Patient Details  Name: Nicholas [name] MRN: [id number] Date of Birth: 09/18/1945 Referring Provider: Alonza Bogus  Encounter Date: 02/06/2017      PT End of Session - 02/07/17 1716    Visit Number 4   Number of Visits 17   Date for PT Re-Evaluation 03/24/17   Authorization Type [employer] every 10th visit   Authorization Time Period 01/23/17  to 03/24/17   PT Start Time 1103   PT Stop Time 1143   PT Time Calculation (min) 40 min   Equipment Utilized During Treatment Gait belt   Activity Tolerance Patient tolerated treatment well   Behavior During Therapy [facility] for tasks assessed/performed      Past Medical History:  Diagnosis Date  . Cancer [facility]) Jan 2008   skin; hx of melanoma left foot/ amputation of toes 1&2   . Hyperlipidemia   . Hypertension   . Lymphedema     Past Surgical History:  Procedure Laterality Date  . 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 removal of melanoma of left foot/amputation of toes 1&25 Jul 2006  . WRIST FRACTURE SURGERY     71 years old-set    There were no vitals filed for this visit.      Subjective Assessment - 02/06/17 1107    Subjective Enjoyed the Parkinson's symposium on Friday.  Feel like I have a hard time with multi-tasking, more so than usual.   Pertinent History lymphedema bilateral lower extremities, malignant melanoma lower leg s/p toe amputation L foot; obesity, CHF, OSA   Patient Stated Goals Pt's goal for therapy is to manage Parkinson's disease symptoms.   Currently in Pain? No/denies                         [facility] Adult PT Treatment/Exercise - 02/07/17 0001      Transfers   Transfers Sit to Stand;Stand to Sit   Sit to Stand 6: Modified independent (Device/Increase time);Without upper extremity assist;From bed;From  chair/3-in-1   Stand to Sit 5: Supervision;To bed;To chair/3-in-1;Without upper extremity assist   Number of Reps 10 reps;Other sets (comment)  from 22" mat, from 18" chair     Ambulation/Gait   Ambulation/Gait Yes   Ambulation/Gait Assistance 4: Min guard   Ambulation/Gait Assistance Details Gait activities with cane (pt brought in from home), negotiating narrow spaces, turns, changes of directions and furniture obstacles.  Cues provided to stop and reset posture as pt begins festinating.  Pt appears to have overall less festinating/freezing episodes with cane.   Ambulation Distance (Feet) 200 Feet  then 150 x 2, 20 ft x 2   Assistive device Straight cane;None   Gait Pattern Step-through pattern;Decreased arm swing - right;Decreased arm swing - left;Decreased step length - right;Decreased step length - left;Festinating;Wide base of support;Poor foot clearance - left;Poor foot clearance - right   Ambulation Surface Level;Indoor   Pre-Gait Activities Practiced turning activities at counter:  quarter turns from counter to R 90 degrees>back to counter, then counter to L 90 degrees>back to counter, 8 reps.  Practiced lateral weightshifting>weightshift turns with cues to slow pace and increase foot clearance, clockwise and counterclockwise 3 reps each.           PWR [facility]) - 02/06/17 1110    PWR! exercises Moves in standing  PWR! Up x 10   PWR! Rock x 10 reps each side   PWR! Twist x 10 reps each side   PWR Step x 10 reps each side with UE support at counter   Comments PWR! Moves in standing-PT providing cues for amplitude, deliberate effort-added to HEP and encouraged pt to perform these everyday.  Discussed and connected each activity to functional daily activities.             PT Education - 02/07/17 1716    Education provided Yes   Education Details HEP additions-see instructions   Person(s) Educated Patient   Methods Explanation;Demonstration;Handout   Comprehension  Verbalized understanding;Returned demonstration;Verbal cues required          PT Short Term Goals - 01/23/17 1839      PT SHORT TERM GOAL #1   Title Pt will be independent with HEP for improved balance, transfers and gait.  TARGET updated to 02/21/17.    Time 4   Period Weeks   Status New     PT SHORT TERM GOAL #2   Title Pt will improve TUG score to less than or equal to 13 seconds for decreased fall risk.   Time 4   Period Weeks   Status New     PT SHORT TERM GOAL #3   Title Pt wil perform at least 8 of 10 reps of sit<>stand transfers from 18 inch surfaces or below, without UE support, independently for improved transfer efficiency and safety.   Time 4   Period Weeks   Status New     PT SHORT TERM GOAL #4   Title Pt will verbalize undersatnding of techniques to reduce freezing episodes with gait and turns     PT SHORT TERM GOAL #5   Title Pt will improve FGA to at least 16/30 for decreased fall risk.   Time 4   Period Weeks   Status New           PT Long Term Goals - 01/23/17 1839      PT LONG TERM GOAL #1   Title Pt will verbalize understanding of fall prevention within the home environment.  TARGET updated 03/24/17   Time 8   Period Weeks   Status New     PT LONG TERM GOAL #2   Title Pt will improve TUG manual to less than or equal to 18 seconds for decreased fall risk/improved dual tasking with gait.   Time 8   Period Weeks   Status New     PT LONG TERM GOAL #3   Title Pt will improve gait velocity with conversation to at least 3 ft/sec for improved gait efficiency and safety.   Time 8   Period Weeks   Status New     PT LONG TERM GOAL #4   Title Pt will improve Functional Gait Assessment score to at least 19/30 for decreased fall risk.   Time 8   Period Weeks   Status New     PT LONG TERM GOAL #5   Title Pt will verbalize plans for optimal continued community fitness upon D/C from PT.   Time 8   Period Weeks   Status New                Plan - 02/07/17 1717    Clinical Impression Statement Pt performs activities during therapy session today without c/o fatigue.  Pt did bring in his own cane today, and states he has been using  it since last PT visit.  Pt encouraged continue use of cane, to aid in pacing of gait and to help him lessen festinating/freezing episodes.  Pt will continue to benefit from skilled PT to address gait, balance, posture.   Rehab Potential Good   PT Frequency 2x / week   PT Duration 8 weeks  plus eval   PT Treatment/Interventions ADLs/Self Care Home Management;Functional mobility training;Gait training;DME Instruction;Patient/family education;Neuromuscular re-education;Balance training;Therapeutic exercise;Therapeutic activities   PT Next Visit Plan Review HEP for standing PWR! Moves, turns; gait with change of direcitons, start/ stops, narrow spaces.   Consulted and Agree with Plan of Care Patient      Patient will benefit from skilled therapeutic intervention in order to improve the following deficits and impairments:  Abnormal gait, Decreased balance, Decreased mobility, Decreased strength, Difficulty walking, Postural dysfunction  Visit Diagnosis: Other abnormalities of gait and mobility  Unsteadiness on feet     Problem List Patient Active Problem List   Diagnosis Date Noted  . BPH associated with nocturia 05/16/2016  . Senile purpura ([place]) 05/03/2016  . OSA (obstructive sleep apnea) 12/28/2015  . Hypersomnia 11/15/2015  . Cough 11/15/2015  . 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 Dizziness 12/30/2014  . Diastolic CHF ([place]) 42/70/6237  . Former smoker 04/29/2014  . Chronic venous insufficiency 02/20/2014  . Hyperglycemia 08/02/2012  . Obesity 07/13/2010  . MALIGNANT MELANOMA SKIN LOWER LIMB INCLUDING HIP 03/09/2009  . Insomnia 10/03/2007  . Hyperlipidemia 12/13/2006  . Essential hypertension 12/13/2006    MARRIOTT,AMY W. 02/07/2017, 5:19 PM  [name]., PT   [facility] [address] [place], [place], [address] Phone: [phone]   Fax:  [phone]  Name: Nicholas [name] MRN: [id number] Date of Birth: [birth year]

## 2017-02-07 NOTE — Patient Instructions (Signed)
Schedule your eye exam. Bring a copy of your advance directives to your next office visit. Check your dose of Benadryl prior to Friday's appointment. Discuss your overactive bladder at night with Dr Yong Channel.  Preventive Care 71 Years and Older, Male Preventive care refers to lifestyle choices and visits with your health care provider that can promote health and wellness. What does preventive care include?  A yearly physical exam. This is also called an annual well check.  Dental exams once or twice a year.  Routine eye exams. Ask your health care provider how often you should have your eyes checked.  Personal lifestyle choices, including: ? Daily care of your teeth and gums. ? Regular physical activity. ? Eating a healthy diet. ? Avoiding tobacco and drug use. ? Limiting alcohol use. ? Practicing safe sex. ? Taking low doses of aspirin every day. ? Taking vitamin and mineral supplements as recommended by your health care provider. What happens during an annual well check? The services and screenings done by your health care provider during your annual well check will depend on your age, overall health, lifestyle risk factors, and family history of disease. Counseling Your health care provider may ask you questions about your:  Alcohol use.  Tobacco use.  Drug use.  Emotional well-being.  Home and relationship well-being.  Sexual activity.  Eating habits.  History of falls.  Memory and ability to understand (cognition).  Work and work Statistician.  Screening You may have the following tests or measurements:  Height, weight, and BMI.  Blood pressure.  Lipid and cholesterol levels. These may be checked every 5 years, or more frequently if you are over 71 years old.  Skin check.  Lung cancer screening. You may have this screening every year starting at age 71 if you have a 30-pack-year history of smoking and currently smoke or have quit within the past 15  years.  Fecal occult blood test (FOBT) of the stool. You may have this test every year starting at age 71.  Flexible sigmoidoscopy or colonoscopy. You may have a sigmoidoscopy every 5 years or a colonoscopy every 10 years starting at age 18.  Prostate cancer screening. Recommendations will vary depending on your family history and other risks.  Hepatitis C blood test.  Hepatitis B blood test.  Sexually transmitted disease (STD) testing.  Diabetes screening. This is done by checking your blood sugar (glucose) after you have not eaten for a while (fasting). You may have this done every 1-3 years.  Abdominal aortic aneurysm (AAA) screening. You may need this if you are a current or former smoker.  Osteoporosis. You may be screened starting at age 71 if you are at high risk.  Talk with your health care provider about your test results, treatment options, and if necessary, the need for more tests. Vaccines Your health care provider may recommend certain vaccines, such as:  Influenza vaccine. This is recommended every year.  Tetanus, diphtheria, and acellular pertussis (Tdap, Td) vaccine. You may need a Td booster every 10 years.  Varicella vaccine. You may need this if you have not been vaccinated.  Zoster vaccine. You may need this after age 71.  Measles, mumps, and rubella (MMR) vaccine. You may need at least one dose of MMR if you were born in 1957 or later. You may also need a second dose.  Pneumococcal 13-valent conjugate (PCV13) vaccine. One dose is recommended after age 71.  Pneumococcal polysaccharide (PPSV23) vaccine. One dose is recommended after age 71.  Meningococcal vaccine. You may need this if you have certain conditions.  Hepatitis A vaccine. You may need this if you have certain conditions or if you travel or work in places where you may be exposed to hepatitis A.  Hepatitis B vaccine. You may need this if you have certain conditions or if you travel or work in  places where you may be exposed to hepatitis B.  Haemophilus influenzae type b (Hib) vaccine. You may need this if you have certain risk factors.  Talk to your health care provider about which screenings and vaccines you need and how often you need them. This information is not intended to replace advice given to you by your health care provider. Make sure you discuss any questions you have with your health care provider. Document Released: 08/06/2015 Document Revised: 03/29/2016 Document Reviewed: 05/11/2015 Elsevier Interactive Patient Education  2017 [employer].

## 2017-02-08 ENCOUNTER — Ambulatory Visit: Payer: PPO | Admitting: Speech Pathology

## 2017-02-08 ENCOUNTER — Ambulatory Visit: Payer: PPO | Admitting: Occupational Therapy

## 2017-02-08 ENCOUNTER — Ambulatory Visit: Payer: PPO | Admitting: Physical Therapy

## 2017-02-08 DIAGNOSIS — R2689 Other abnormalities of gait and mobility: Secondary | ICD-10-CM

## 2017-02-08 DIAGNOSIS — R471 Dysarthria and anarthria: Secondary | ICD-10-CM

## 2017-02-08 DIAGNOSIS — R278 Other lack of coordination: Secondary | ICD-10-CM

## 2017-02-08 DIAGNOSIS — R29818 Other symptoms and signs involving the nervous system: Secondary | ICD-10-CM | POA: Diagnosis not present

## 2017-02-08 DIAGNOSIS — R2681 Unsteadiness on feet: Secondary | ICD-10-CM

## 2017-02-08 DIAGNOSIS — R29898 Other symptoms and signs involving the musculoskeletal system: Secondary | ICD-10-CM

## 2017-02-08 NOTE — Therapy (Signed)
[facility] [address] [place], [place], [address] Phone: [phone]   Fax:  [phone]  Speech Language Pathology Treatment  Patient Details  Name: Nicholas [name] MRN: [id number] Date of Birth: June 14, 1946 Referring Provider: Alonza Bogus, DO  Encounter Date: 02/08/2017      End of Session - 02/08/17 1349    Visit Number 6   Number of Visits 17   Date for SLP Re-Evaluation 02/23/17   SLP Start Time 1316   SLP Stop Time  1359   SLP Time Calculation (min) 43 min   Activity Tolerance Patient tolerated treatment well      Past Medical History:  Diagnosis Date  . 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 Hyperlipidemia   . Hypertension   . Lymphedema     Past Surgical History:  Procedure Laterality Date  . 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 removal of melanoma of left foot/amputation of toes 1&25 Jul 2006  . WRIST FRACTURE SURGERY     71 years old-set    There were no vitals filed for this visit.      Subjective Assessment - 02/08/17 1321    Subjective "If I go to spin and come here I am worn out"   Currently in Pain? No/denies               ADULT SLP TREATMENT - 02/08/17 1322      General Information   Behavior/Cognition Alert;Cooperative;Pleasant mood     Treatment Provided   Treatment provided Cognitive-Linquistic     Cognitive-Linquistic Treatment   Treatment focused on Dysarthria   Skilled Treatment Loud /a/ average 93dB in order to recalibrate conversational loudness - Structured speech tasks generating multiple meaning sentences with average of 70dB with ongoing modeling and visual and verbal cues.  Conversation min to low 60's with ongoing cueing for volume and breath support.      Assessment / Recommendations / Plan   Plan Continue with current plan of care     Progression Toward Goals   Progression toward goals Progressing  toward goals            SLP Short Term Goals - 02/08/17 1348      SLP SHORT TERM GOAL #1   Title pt will sustain loud /a/ at 94dB over 4 consecutive sessions   Time 2   Period Weeks   Status On-going     SLP SHORT TERM GOAL #2   Title pt will exhibit volume of 70dB in sentence responses 95%   Time 2   Period Weeks   Status On-going     SLP SHORT TERM GOAL #3   Title In 7 minutes simple conversation pt will achieve average speech volume of low 70s dB over two sessions   Time 2   Period Weeks   Status On-going          SLP Long Term Goals - 02/08/17 1349      SLP LONG TERM GOAL #1   Title pt will sustain loud /a/ at average 94dB over 4 consecutive sessions with SBA   Time 6   Period Weeks   Status On-going     SLP LONG TERM GOAL #2   Title pt will generate speech in 10 minutes mod complex conversation with average 70dB over three sessions   Time 6   Period Weeks   Status  On-going          Plan - 02/08/17 1348    Clinical Impression Statement Pt becoming more aware of need to take speech homework seriously due to awareness of incr'd difficulty with speech volume. Continue skilled ST to maximize intellgibility and compensations  cognition for independence and to reduce caregiver burden.   Speech Therapy Frequency 2x / week   Treatment/Interventions Compensatory techniques;Internal/external aids;SLP instruction and feedback;Functional tasks;Patient/family education;Cueing hierarchy;Environmental controls;Cognitive reorganization   Potential to Achieve Goals Good   SLP Home Exercise Plan loud /a/   Consulted and Agree with Plan of Care Patient      Patient will benefit from skilled therapeutic intervention in order to improve the following deficits and impairments:   Dysarthria    Problem List Patient Active Problem List   Diagnosis Date Noted  . BPH associated with nocturia 05/16/2016  . Senile purpura ([place]) 05/03/2016  . OSA (obstructive sleep apnea)  12/28/2015  . Hypersomnia 11/15/2015  . Cough 11/15/2015  . 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 Dizziness 12/30/2014  . Diastolic CHF ([place]) 69/48/5462  . Former smoker 04/29/2014  . Chronic venous insufficiency 02/20/2014  . Hyperglycemia 08/02/2012  . Obesity 07/13/2010  . MALIGNANT MELANOMA SKIN LOWER LIMB INCLUDING HIP 03/09/2009  . Insomnia 10/03/2007  . Hyperlipidemia 12/13/2006  . Essential hypertension 12/13/2006     Georgiann Hahn MS, CCC-SLP 02/08/2017, 2:04 PM  [facility] [address] [place], [place], [address] Phone: [phone]   Fax:  [phone]   Name: HOY FALLERT MRN: [id number] Date of Birth: 02/26/1946

## 2017-02-08 NOTE — Therapy (Signed)
[facility] [address] [place], [place], [address] Phone: [phone]   Fax:  [phone]  Occupational Therapy Treatment  Patient Details  Name: Nicholas [name] MRN: [id number] Date of Birth: January 25, 1946 Referring Provider: Dr. Carles Collet  Encounter Date: 02/08/2017      OT End of Session - 02/08/17 1646    Visit Number 6   Number of Visits 17   Date for OT Re-Evaluation 02/13/17   Authorization Type [employer], G code needed   Authorization Time Period 60 days- 02/13/17   Authorization - Visit Number 6   Authorization - Number of Visits 10   OT Start Time 2836   OT Stop Time 1445   OT Time Calculation (min) 40 min   Activity Tolerance Patient tolerated treatment well   Behavior During Therapy [facility] for tasks assessed/performed      Past Medical History:  Diagnosis Date  . 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 Hyperlipidemia   . Hypertension   . Lymphedema     Past Surgical History:  Procedure Laterality Date  . 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 removal of melanoma of left foot/amputation of toes 1&25 Jul 2006  . WRIST FRACTURE SURGERY     71 years old-set    There were no vitals filed for this visit.      Subjective Assessment - 02/08/17 1455    Patient Stated Goals fluid movement, maintain independence   Currently in Pain? Yes   Pain Score 4    Pain Location Arm   Pain Orientation Right   Pain Descriptors / Indicators Aching   Pain Type Acute pain   Pain Onset 1 to 4 weeks ago          Treatment: PWR! Seated basic 4, 10-20 reps each, min v.c Fine motor coordination tasks,: stacking coins, flipping cards rotating tossing ball, min v.c for large amplitude movements Stretching in prep for lower body dressing, reaching for ankles and then lifting feet.  Pt did not bring in his stocking for practice today.                      OT  Short Term Goals - 01/23/17 1709      OT SHORT TERM GOAL #1   Title I with updated PD specific HEP- due 02/09/17   Time 4   Period Weeks   Status New     OT SHORT TERM GOAL #2   Title Pt will verbalize understanding of adapted strategies for ADLs/IADLS.   Time 4   Period Weeks   Status New     OT SHORT TERM GOAL #3   Title Pt will demonstrate improved standing balance for ADLs as evidenced by improving standing functional reach to 10 inches or greater for LUE.   Baseline RUE 10.5 inches, LUE 9 inches   Time 4   Period Weeks   Status New     OT SHORT TERM GOAL #4   Title Pt will demonstrate improved bilateral UE functional use for ADLs as evidenced by  increasing box/ blocks by 3 blocks.   Baseline RUE 46 blocks, LUe 47 blocks   Time 4   Period Weeks   Status New           OT Long Term Goals - 12/19/16 1515      OT LONG TERM GOAL #1  Title Pt will verbalize understanding of ways to prevent future PD related complications and verbalize understanding of community resources.   Time 8   Period Weeks   Status New     OT LONG TERM GOAL #2   Title Pt will demonstrate improved LUE fine motor coordination for ADLS as evidenced by decreasing LUE 9 hole peg test  to 29 secs or less.   Time 8   Period Weeks   Status New               Plan - 02/08/17 1652    Clinical Impression Statement Pt did not bring in compression stocking for practice. Pt reports mild right arm pain.   Rehab Potential Good   Current Impairments/barriers affecting progress: freezing episodes   OT Frequency 2x / week   OT Duration 8 weeks   OT Treatment/Interventions Self-care/ADL training;DME and/or AE instruction;Patient/family education;Balance training;Therapeutic exercises;Therapeutic activities;Therapeutic exercise;Ultrasound;Neuromuscular education;Passive range of motion;Functional Mobility Training;Cognitive remediation/compensation;Visual/perceptual remediation/compensation;Manual  Therapy;Energy conservation   Plan check short term goals, renew, pt out of town for 1-2 weeks   Consulted and Agree with Plan of Care Patient      Patient will benefit from skilled therapeutic intervention in order to improve the following deficits and impairments:  Abnormal gait, Decreased cognition, Decreased knowledge of use of DME, Increased edema, Impaired flexibility, Decreased mobility, Decreased coordination, Decreased activity tolerance, Decreased endurance, Decreased strength, Impaired tone, Impaired UE functional use, Decreased safety awareness, Decreased knowledge of precautions, Decreased balance  Visit Diagnosis: Other symptoms and signs involving the nervous system  Other symptoms and signs involving the musculoskeletal system  Other lack of coordination    Problem List Patient Active Problem List   Diagnosis Date Noted  . BPH associated with nocturia 05/16/2016  . Senile purpura ([place]) 05/03/2016  . OSA (obstructive sleep apnea) 12/28/2015  . Hypersomnia 11/15/2015  . Cough 11/15/2015  . 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 Dizziness 12/30/2014  . Diastolic CHF ([place]) 21/30/8657  . Former smoker 04/29/2014  . Chronic venous insufficiency 02/20/2014  . Hyperglycemia 08/02/2012  . Obesity 07/13/2010  . MALIGNANT MELANOMA SKIN LOWER LIMB INCLUDING HIP 03/09/2009  . Insomnia 10/03/2007  . Hyperlipidemia 12/13/2006  . Essential hypertension 12/13/2006    RINE,KATHRYN 02/08/2017, 4:54 PM Theone Murdoch, OTR/L [phone] Phone: [phone] 4:54 PM 02/08/17 [facility] [address] [place], [place], [address] Phone: [phone]   Fax:  [phone]  Name: Nicholas [name] MRN: [id number] Date of Birth: 1946/05/19

## 2017-02-09 ENCOUNTER — Encounter: Payer: Self-pay | Admitting: Family Medicine

## 2017-02-09 ENCOUNTER — Ambulatory Visit (INDEPENDENT_AMBULATORY_CARE_PROVIDER_SITE_OTHER): Payer: PPO | Admitting: Family Medicine

## 2017-02-09 VITALS — BP 130/76 | HR 79 | Temp 98.1°F | Ht 67.0 in | Wt 230.0 lb

## 2017-02-09 DIAGNOSIS — I10 Essential (primary) hypertension: Secondary | ICD-10-CM

## 2017-02-09 DIAGNOSIS — D692 Other nonthrombocytopenic purpura: Secondary | ICD-10-CM | POA: Diagnosis not present

## 2017-02-09 DIAGNOSIS — N401 Enlarged prostate with lower urinary tract symptoms: Secondary | ICD-10-CM

## 2017-02-09 DIAGNOSIS — G232 Striatonigral degeneration: Secondary | ICD-10-CM

## 2017-02-09 DIAGNOSIS — I890 Lymphedema, not elsewhere classified: Secondary | ICD-10-CM

## 2017-02-09 DIAGNOSIS — R351 Nocturia: Secondary | ICD-10-CM | POA: Diagnosis not present

## 2017-02-09 DIAGNOSIS — R3589 Other polyuria: Secondary | ICD-10-CM

## 2017-02-09 DIAGNOSIS — R358 Other polyuria: Secondary | ICD-10-CM | POA: Diagnosis not present

## 2017-02-09 DIAGNOSIS — E785 Hyperlipidemia, unspecified: Secondary | ICD-10-CM | POA: Diagnosis not present

## 2017-02-09 DIAGNOSIS — G20C Parkinsonism, unspecified: Secondary | ICD-10-CM

## 2017-02-09 LAB — POC URINALSYSI DIPSTICK (AUTOMATED)
Bilirubin, UA: NEGATIVE
GLUCOSE UA: NEGATIVE
Ketones, UA: NEGATIVE
Leukocytes, UA: NEGATIVE
NITRITE UA: NEGATIVE
PH UA: 6 (ref 5.0–8.0)
Protein, UA: NEGATIVE
RBC UA: NEGATIVE
UROBILINOGEN UA: 0.2 U/dL

## 2017-02-09 LAB — POCT GLYCOSYLATED HEMOGLOBIN (HGB A1C): HEMOGLOBIN A1C: 5.6

## 2017-02-09 MED ORDER — MIRABEGRON ER 25 MG PO TB24: 25.0000 mg | ORAL_TABLET | Freq: Every day | ORAL | 5 refills | Status: DC

## 2017-02-09 NOTE — Progress Notes (Signed)
Subjective:  Nicholas [name] is a 71 y.o. year old very pleasant male patient who presents for/with See problem oriented charting ROS- No chest pain or shortness of breath. No headache or blurry vision.  Continued tremor and orthostatic symptoms   Past Medical History-  Patient Active Problem List   Diagnosis Date Noted  . 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 Diastolic CHF ([place]) 79/89/2119    Priority: High  . MALIGNANT MELANOMA SKIN LOWER LIMB INCLUDING HIP 03/09/2009    Priority: High  . BPH associated with nocturia 05/16/2016    Priority: Medium  . OSA (obstructive sleep apnea) 12/28/2015    Priority: Medium  . Chronic venous insufficiency 02/20/2014    Priority: Medium  . Hyperglycemia 08/02/2012    Priority: Medium  . Hyperlipidemia 12/13/2006    Priority: Medium  . Essential hypertension 12/13/2006    Priority: Medium  . Senile purpura ([place]) 05/03/2016    Priority: Low  . Hypersomnia 11/15/2015    Priority: Low  . Former smoker 04/29/2014    Priority: Low  . Obesity 07/13/2010    Priority: Low  . Insomnia 10/03/2007    Priority: Low  . Lymphedema 02/10/2017  . Cough 11/15/2015  . Dizziness 12/30/2014    Medications- reviewed and updated Current Outpatient Prescriptions  Medication Sig Dispense Refill  . diphenhydrAMINE (BENADRYL) 25 mg capsule Take 25 mg by mouth at bedtime.    . rosuvastatin (CRESTOR) 10 MG tablet TAKE 1 TABLET ONCE DAILY. 90 tablet 1  . carbidopa-levodopa (SINEMET IR) 25-100 MG tablet Take by mouth See admin instructions. 1.5 tab in the morning, 1.5 tab in the afternoon, 1 tab in the evening     No current facility-administered medications for this visit.     Objective: BP 130/76 (BP Location: Left Arm, Patient Position: Sitting, Cuff Size: Large)   Pulse 79   Temp 98.1 F (36.7 C) (Oral)   Ht 5\' 7"  (1.702 m)   Wt 230 lb (104.3 kg)   SpO2 94%   BMI 36.02 kg/m  Gen: NAD, resting comfortably CV: RRR no  murmurs rubs or gallops Lungs: CTAB no crackles, wheeze, rhonchi Abdomen: soft/nontender/nondistended/normal bowel sounds. obese Ext: no edema Skin: warm, dry Neuro: slow gait, masked faces, resting tremor noted Assessment/Plan:  BPH associated with nocturia S:Gets up in middle of night- may have freezing episode getting to bathroom and has incontinence. 6 months. NOcturia 1-2x a night. Peeing every 2 hours. Dribbling issues. Does feel like suddenly has to go oftentimes.  A/P: Worried flomax would cause worsening dizziness with his baseline orthostasis. He is concerned about anticholinergic symptoms with oxybutynin and vesicare. I suspect some of this may be BPH but some could be overactive bladder type symptoms- We opted to trial myrbetriq and follow up in 1 month to monitor BP   Lymphedema S: lymphedema left leg after prior melanoma resection. Apparently it has been discovered by [facility] that the right leg is actually related to this nad not just chronic venous insufficiency and has recommended PT and compression if can fit it A/P: Patient requests referral to [place] who specializes in lymphedema- we will refer today.    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 Carles Collet. Compliant with sinemet. Continued lightheadedness issues- longstanding issue for him. He has been using benadryl 25mg  for sleep and asks if this is ok A/P: in [facility] and Dr.  [name]. Continue current meds and may take benadryl prn for sleep  Essential hypertension S: controlled on no rx.  ASCVD 10 year risk calculation if age 36-79: on statin BP Readings from Last 3 Encounters:  02/09/17 130/76  02/07/17 118/72  11/28/16 130/84  A/P: We discussed blood pressure goal of <140/90. Continue current meds:  Recheck 1 month as starting myrbetriq and risk of HTN   Hyperlipidemia S: well controlled on last check with LDL <50. No myalgias.  Lab  Results  Component Value Date   CHOL 155 08/25/2013   HDL 38.30 (L) 08/25/2013   LDLCALC 90 08/25/2013   LDLDIRECT 50.0 05/03/2016   TRIG 136.0 08/25/2013   CHOLHDL 4 08/25/2013   A/P: continue current meds- consider updating next time we see patient   1 month   Orders Placed This Encounter  Procedures  . Urine Culture    [facility]  . AMB referral to rehabilitation    Referral Priority:   Routine    Referral Type:   Consultation    Number of Visits Requested:   1  . POCT Urinalysis Dipstick (Automated)  . POCT glycosylated hemoglobin (Hb A1C)    Meds ordered this encounter  Medications  . mirabegron ER (MYRBETRIQ) 25 MG TB24 tablet    Sig: Take 1 tablet (25 mg total) by mouth daily.    Dispense:  30 tablet    Refill:  5    Return precautions advised.  [name], MD

## 2017-02-09 NOTE — Therapy (Signed)
[facility] [address] [place], [place], [address] Phone: [phone]   Fax:  [phone]  Physical Therapy Treatment  Patient Details  Name: Nicholas [name] MRN: [id number] Date of Birth: 09-18-1945 Referring Provider: Alonza Bogus  Encounter Date: 02/08/2017      PT End of Session - 02/09/17 1615    Visit Number 5   Number of Visits 17   Date for PT Re-Evaluation 03/24/17   Authorization Type [employer] every 10th visit   Authorization Time Period 01/23/17  to 03/24/17   PT Start Time 1407   PT Stop Time 1446   PT Time Calculation (min) 39 min   Equipment Utilized During Treatment Gait belt   Activity Tolerance Patient tolerated treatment well   Behavior During Therapy [facility] for tasks assessed/performed      Past Medical History:  Diagnosis Date  . Cancer [facility]) Jan 2008   skin; hx of melanoma left foot/ amputation of toes 1&2   . Hyperlipidemia   . Hypertension   . Lymphedema     Past Surgical History:  Procedure Laterality Date  . 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 removal of melanoma of left foot/amputation of toes 1&25 Jul 2006  . WRIST FRACTURE SURGERY     71 years old-set    There were no vitals filed for this visit.      Subjective Assessment - 02/08/17 1409    Subjective Didn't go to Spin class today, because when I do that with therapy, I'm too worn out.  Haven't had a chance to do the exercises.   Pertinent History lymphedema bilateral lower extremities, malignant melanoma lower leg s/p toe amputation L foot; obesity, CHF, OSA   Patient Stated Goals Pt's goal for therapy is to manage Parkinson's disease symptoms.   Currently in Pain? No/denies                         [facility] Adult PT Treatment/Exercise - 02/08/17 1410      Transfers   Transfers Sit to Stand;Stand to Sit   Sit to Stand 6: Modified independent (Device/Increase time);Without upper extremity  assist;From bed;From chair/3-in-1   Stand to Sit 6: Modified independent (Device/Increase time);Without upper extremity assist;To bed;To chair/3-in-1   Number of Reps 10 reps;Other sets (comment)  from 22" mat, then 18" chair   Transfer Cueing Initial cues provided for slowed descent into sitting     Ambulation/Gait   Ambulation/Gait Yes   Ambulation/Gait Assistance 5: Supervision   Ambulation Distance (Feet) 150 Feet  x 2   Assistive device Straight cane   Gait Pattern Step-through pattern;Decreased arm swing - right;Decreased arm swing - left;Decreased step length - right;Decreased step length - left;Festinating;Wide base of support;Poor foot clearance - left;Poor foot clearance - right   Ambulation Surface Level;Indoor   Pre-Gait Activities Reviewed clockwise and counterclockwise turns 90 degree with head turns leading, 5 reps to R and 5 reps to L.   Gait Comments Gait activities negotiating obstacles:  balance disks placed on floor-pt ambulates through the obstacles (not stepping on), then on the obstacles, 2 reps each direction with widened BOS and improved SLS.  Then, as obstacles rearranged-pt negotiates narrowed space between obstacles (simulating chairs, narrowed spaces where he typically freezes)-sidestepping-cues provided for slowed pace and larger step/foot clearance with side stepping-3 reps each direction; then forward negotiation in narrow space with cues for visual target beyond obstacles.  Pt able to  negotiate using cane, with decreased festinating/freezing with practice.     High Level Balance   High Level Balance Activities Side stepping  R and L at counter, 3 reps each direction   High Level Balance Comments Cues for increased foot clearance and step length and slowed pace.  Forward step and weightshift x 10 reps, then forward/back step and weigthshift x 10 reps each leg with 1 UE support.           [facility]) - 02/09/17 1613    PWR! exercises Moves in standing   Review of HEP provided last visit   PWR! Up x 20   PWR! Rock x 10 reps each side   PWR! Twist x 10 reps each side   PWR Step x 10 reps each side   Comments Review of PWR! Moves in standing at counter-pt return demo understanding (but has not done them at home).                 PT Short Term Goals - 01/23/17 1839      PT SHORT TERM GOAL #1   Title Pt will be independent with HEP for improved balance, transfers and gait.  TARGET updated to 02/21/17.    Time 4   Period Weeks   Status New     PT SHORT TERM GOAL #2   Title Pt will improve TUG score to less than or equal to 13 seconds for decreased fall risk.   Time 4   Period Weeks   Status New     PT SHORT TERM GOAL #3   Title Pt wil perform at least 8 of 10 reps of sit<>stand transfers from 18 inch surfaces or below, without UE support, independently for improved transfer efficiency and safety.   Time 4   Period Weeks   Status New     PT SHORT TERM GOAL #4   Title Pt will verbalize undersatnding of techniques to reduce freezing episodes with gait and turns     PT SHORT TERM GOAL #5   Title Pt will improve FGA to at least 16/30 for decreased fall risk.   Time 4   Period Weeks   Status New           PT Long Term Goals - 01/23/17 1839      PT LONG TERM GOAL #1   Title Pt will verbalize understanding of fall prevention within the home environment.  TARGET updated 03/24/17   Time 8   Period Weeks   Status New     PT LONG TERM GOAL #2   Title Pt will improve TUG manual to less than or equal to 18 seconds for decreased fall risk/improved dual tasking with gait.   Time 8   Period Weeks   Status New     PT LONG TERM GOAL #3   Title Pt will improve gait velocity with conversation to at least 3 ft/sec for improved gait efficiency and safety.   Time 8   Period Weeks   Status New     PT LONG TERM GOAL #4   Title Pt will improve Functional Gait Assessment score to at least 19/30 for decreased fall risk.   Time 8    Period Weeks   Status New     PT LONG TERM GOAL #5   Title Pt will verbalize plans for optimal continued community fitness upon D/C from PT.   Time 8   Period Weeks   Status New  Plan - 02/09/17 1616    Clinical Impression Statement In therapy session, pt appears to be performing exercises well and appears to have decreased festinating episodes with cane.  With blocked practice with obstacles and narrow spaces today, pt able to show improved ability to negotiate those spacces with cues for pacing and deliberate step length.  Pt will continue to benefit from skilled PT to address gait, balance, and posture.   Rehab Potential Good   PT Frequency 2x / week   PT Duration 8 weeks  plus eval   PT Treatment/Interventions ADLs/Self Care Home Management;Functional mobility training;Gait training;DME Instruction;Patient/family education;Neuromuscular re-education;Balance training;Therapeutic exercise;Therapeutic activities   PT Next Visit Plan Continue to work on gait, turning with change of directions, start/stops, narrow spaces, obstacles   Consulted and Agree with Plan of Care Patient      Patient will benefit from skilled therapeutic intervention in order to improve the following deficits and impairments:  Abnormal gait, Decreased balance, Decreased mobility, Decreased strength, Difficulty walking, Postural dysfunction  Visit Diagnosis: Other abnormalities of gait and mobility  Unsteadiness on feet  Other symptoms and signs involving the nervous system     Problem List Patient Active Problem List   Diagnosis Date Noted  . BPH associated with nocturia 05/16/2016  . Senile purpura ([place]) 05/03/2016  . OSA (obstructive sleep apnea) 12/28/2015  . Hypersomnia 11/15/2015  . Cough 11/15/2015  . 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 Dizziness 12/30/2014  . Diastolic CHF ([place]) 28/76/8115  . Former smoker 04/29/2014  . Chronic venous insufficiency  02/20/2014  . Hyperglycemia 08/02/2012  . Obesity 07/13/2010  . MALIGNANT MELANOMA SKIN LOWER LIMB INCLUDING HIP 03/09/2009  . Insomnia 10/03/2007  . Hyperlipidemia 12/13/2006  . Essential hypertension 12/13/2006    Trevious Rampey W. 02/09/2017, 4:19 PM  [name]., PT   [facility] [address] [place], [place], [address] Phone: [phone]   Fax:  [phone]  Name: Nicholas [name] MRN: [id number] Date of Birth: [birth year]

## 2017-02-09 NOTE — Patient Instructions (Addendum)
We will call you within a week or two about your referral to physical therapy [facility]. If you do not hear within 3 weeks, give [place] a call.   Trial myrbetriq for overactive bladder. Some of this could be enlarged prostate but dont want to start medicine for that without rectal exam and I am worried it could make you lightheaded  See me in 1 month for blood pressure check as this can raise blood pressure  Please stop by lab before you go = drop off urine

## 2017-02-10 DIAGNOSIS — I890 Lymphedema, not elsewhere classified: Secondary | ICD-10-CM | POA: Insufficient documentation

## 2017-02-10 LAB — URINE CULTURE: Organism ID, Bacteria: NO GROWTH

## 2017-02-10 NOTE — Assessment & Plan Note (Signed)
S: continues to follow up with [facility] as well as Dr. Carles Collet. Compliant with sinemet. Continued lightheadedness issues- longstanding issue for him. He has been using benadryl 25mg  for sleep and asks if this is ok A/P: in [facility] neurology team between [place] and Dr. Carles Collet. Continue current meds and may take benadryl prn for sleep

## 2017-02-10 NOTE — Assessment & Plan Note (Signed)
Continued mild issues- easy bruising on forearms despite no aspirin or anticoagulation

## 2017-02-10 NOTE — Assessment & Plan Note (Signed)
S: well controlled on last check with LDL <50. No myalgias.  Lab Results  Component Value Date   CHOL 155 08/25/2013   HDL 38.30 (L) 08/25/2013   LDLCALC 90 08/25/2013   LDLDIRECT 50.0 05/03/2016   TRIG 136.0 08/25/2013   CHOLHDL 4 08/25/2013   A/P: continue current meds- consider updating next time we see patient

## 2017-02-10 NOTE — Assessment & Plan Note (Signed)
S: controlled on no rx.  ASCVD 10 year risk calculation if age 71-79: on statin BP Readings from Last 3 Encounters:  02/09/17 130/76  02/07/17 118/72  11/28/16 130/84  A/P: We discussed blood pressure goal of <140/90. Continue current meds:  Recheck 1 month as starting myrbetriq and risk of HTN

## 2017-02-10 NOTE — Assessment & Plan Note (Addendum)
S:Gets up in middle of night- may have freezing episode getting to bathroom and has incontinence. 6 months. NOcturia 1-2x a night. Peeing every 2 hours. Dribbling issues. Does feel like suddenly has to go oftentimes.  A/P: Worried flomax would cause worsening dizziness with his baseline orthostasis. He is concerned about anticholinergic symptoms with oxybutynin and vesicare. I suspect some of this may be BPH but some could be overactive bladder type symptoms- We opted to trial myrbetriq and follow up in 1 month to monitor BP

## 2017-02-10 NOTE — Assessment & Plan Note (Signed)
S: lymphedema left leg after prior melanoma resection. Apparently it has been discovered by [facility] that the right leg is actually related to this nad not just chronic venous insufficiency and has recommended PT and compression if can fit it A/P: Patient requests referral to [facility] who specializes in lymphedema- we will refer today.

## 2017-02-23 ENCOUNTER — Ambulatory Visit: Payer: PPO | Admitting: Occupational Therapy

## 2017-02-23 ENCOUNTER — Ambulatory Visit: Payer: PPO

## 2017-02-23 ENCOUNTER — Ambulatory Visit: Payer: PPO | Attending: Neurology | Admitting: Physical Therapy

## 2017-02-23 DIAGNOSIS — R471 Dysarthria and anarthria: Secondary | ICD-10-CM

## 2017-02-23 DIAGNOSIS — R2681 Unsteadiness on feet: Secondary | ICD-10-CM

## 2017-02-23 DIAGNOSIS — R29818 Other symptoms and signs involving the nervous system: Secondary | ICD-10-CM

## 2017-02-23 DIAGNOSIS — R29898 Other symptoms and signs involving the musculoskeletal system: Secondary | ICD-10-CM

## 2017-02-23 DIAGNOSIS — I890 Lymphedema, not elsewhere classified: Secondary | ICD-10-CM | POA: Diagnosis not present

## 2017-02-23 DIAGNOSIS — R2689 Other abnormalities of gait and mobility: Secondary | ICD-10-CM

## 2017-02-23 DIAGNOSIS — R41841 Cognitive communication deficit: Secondary | ICD-10-CM | POA: Diagnosis not present

## 2017-02-23 DIAGNOSIS — R278 Other lack of coordination: Secondary | ICD-10-CM | POA: Insufficient documentation

## 2017-02-23 NOTE — Therapy (Signed)
[facility] [address] [place], [place], [address] Phone: [phone]   Fax:  [phone]  Speech Language Pathology Treatment  Patient Details  Name: Nicholas [name] MRN: [id number] Date of Birth: 10-May-1946 Referring Provider: Alonza Bogus, DO  Encounter Date: 02/23/2017      End of Session - 02/23/17 1433    Visit Number 7   Number of Visits 17   Date for SLP Re-Evaluation 03/30/17   SLP Start Time 1150   SLP Stop Time  1230   SLP Time Calculation (min) 40 min   Activity Tolerance Patient tolerated treatment well      Past Medical History:  Diagnosis Date  . Cancer [facility]) Jan 2008   skin; hx of melanoma left foot/ amputation of toes 1&2   . Hyperlipidemia   . Hypertension   . Lymphedema     Past Surgical History:  Procedure Laterality Date  . 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 removal of melanoma of left foot/amputation of toes 1&25 Jul 2006  . WRIST FRACTURE SURGERY     71 years old-set    There were no vitals filed for this visit.      Subjective Assessment - 02/23/17 1156    Subjective "I've done them once a day." (pt re: loud /a/)   Currently in Pain? Yes   Pain Score 3    Pain Location Arm   Pain Orientation Right   Pain Descriptors / Indicators Aching   Pain Type Acute pain   Pain Onset More than a month ago   Pain Frequency Intermittent   Aggravating Factors  movement over shoulder    Pain Relieving Factors resting               ADULT SLP TREATMENT - 02/23/17 1158      General Information   Behavior/Cognition Alert;Cooperative;Pleasant mood     Treatment Provided   Treatment provided Cognitive-Linquistic     Cognitive-Linquistic Treatment   Treatment focused on Dysarthria   Skilled Treatment Loud /a/ average 88dB in order to recalibrate conversational loudness - SLP strongly stressed to pt that x1/day is not enough to achieve muscle memory necessary for change. SLP  problem solved with pt to see when he oculd accomplish loud /a/ - in the car was decided upon. SLP said first trip in the car each day is when pt will complete. Pt stated he would place sticky note on his dash to remind. Structured speech tasks generating multiple meaning sentences with average of 70dB with ongoing modeling and visual and verbal cues. Conversation mid 17s dB today - SLP stressed to pt he must practice louder speech at home in order to make progress.      Assessment / Recommendations / Plan   Plan Continue with current plan of care     Progression Toward Goals   Progression toward goals Not progressing toward goals (comment)  suboptimal participation from pt to date            SLP Short Term Goals - 02/23/17 1443      SLP SHORT TERM GOAL #1   Title pt will sustain loud /a/ at 94dB over 4 consecutive sessions   Period Weeks   Status Not Met     SLP SHORT TERM GOAL #2   Title pt will exhibit volume of 70dB in sentence responses 95%   Status Not Met     SLP SHORT TERM GOAL #3  Title In 7 minutes simple conversation pt will achieve average speech volume of low 70s dB over two sessions   Status Not Met          SLP Long Term Goals - 02/23/17 1444      SLP LONG TERM GOAL #1   Title pt will sustain loud /a/ at average 92dB over 3 consecutive sessions with SBA   Time 5   Period Weeks   Status Revised     SLP LONG TERM GOAL #2   Title pt will generate speech in 10 minutes mod complex conversation with average 70dB over three sessions   Time 5   Period Weeks   Status On-going          Plan - 02/23/17 1434    Clinical Impression Statement Pt did not complete loud /a/ while on vacation last week. SLP had very frank discussion with pt about taking homework and work on therapy tasks seriously. Pt's loud /a/ average decr'd by 5 dB. Pt told SLP he would complete 5 reps loud /a/ twice each day during first trip in car each day. Also provided pt with homework and  stated he needed to complete 20-30 minutes each day. Pt would benefit from continued skilled ST for 4 more weeks (or 8 more sessions) to maximize intellgibility. If pt participation in therapy does not improve notably, he will likely be discharged.    Speech Therapy Frequency 2x / week   Duration 4 weeks  (8 more sessions)   Treatment/Interventions Compensatory techniques;Internal/external aids;SLP instruction and feedback;Functional tasks;Patient/family education;Cueing hierarchy;Environmental controls;Cognitive reorganization   Potential to Achieve Goals Good   Potential Considerations Cooperation/participation level   SLP Home Exercise Plan loud /a/   Consulted and Agree with Plan of Care Patient      Patient will benefit from skilled therapeutic intervention in order to improve the following deficits and impairments:   Dysarthria and anarthria - Plan: SLP plan of care cert/re-cert  Cognitive communication deficit - Plan: SLP plan of care cert/re-cert    Problem List Patient Active Problem List   Diagnosis Date Noted  . Lymphedema 02/10/2017  . BPH associated with nocturia 05/16/2016  . Senile purpura ([place]) 05/03/2016  . OSA (obstructive sleep apnea) 12/28/2015  . Hypersomnia 11/15/2015  . Cough 11/15/2015  . 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 Dizziness 12/30/2014  . Diastolic dysfunction 14/38/8875  . Former smoker 04/29/2014  . Chronic venous insufficiency 02/20/2014  . Hyperglycemia 08/02/2012  . Obesity 07/13/2010  . History of malignant melanoma. left leg 03/09/2009  . Insomnia 10/03/2007  . Hyperlipidemia 12/13/2006  . Essential hypertension 12/13/2006    [facility] ,[place], [place]  02/23/2017, 2:45 PM  [facility] [address] [place], [place], [address] Phone: [phone]   Fax:  [phone]   Name: Nicholas [name] MRN: [id number] Date of Birth: Mar 31, 1946

## 2017-02-23 NOTE — Therapy (Signed)
[facility] [address] [place], [place], [address] Phone: [phone]   Fax:  [phone]  Occupational Therapy Treatment  Patient Details  Name: Nicholas [name] MRN: [id number] Date of Birth: 1945-10-03 Referring Provider: Dr. Carles Collet  Encounter Date: 02/23/2017      OT End of Session - 02/23/17 1037    Visit Number 7   Number of Visits 17   Date for OT Re-Evaluation 02/13/17   Authorization Type [employer], G code needed   Authorization Time Period 60 days- 02/13/17   Authorization - Visit Number 7   Authorization - Number of Visits 10   OT Start Time 1019   OT Stop Time 1100   OT Time Calculation (min) 41 min   Activity Tolerance Patient tolerated treatment well   Behavior During Therapy [facility] for tasks assessed/performed      Past Medical History:  Diagnosis Date  . 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 Hyperlipidemia   . Hypertension   . Lymphedema     Past Surgical History:  Procedure Laterality Date  . 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 removal of melanoma of left foot/amputation of toes 1&25 Jul 2006  . WRIST FRACTURE SURGERY     71 years old-set    There were no vitals filed for this visit.      Subjective Assessment - 02/23/17 1035    Subjective  Pt reports he had a great time in [place]   Patient Stated Goals fluid movement, maintain independence   Currently in Pain? No/denies               Treatment:Therapist educated pt in use of compression hose donner and sock aide. Pt attempted use however due to there severity of lymphadema in in right leg pt was unable to use either successfully. Therapist assisted pt with donning his right compression stocking as he was unable to apply independently (mod A.) PWR! Hands basic 4 x 5-10 reps each. Therapist checked progress towards short term goals.                 OT Short Term  Goals - 02/23/17 1038      OT SHORT TERM GOAL #1   Title I with updated PD specific HEP- due 02/09/17   Time --   Period Weeks   Status Achieved  met pt verbalizes understanding of coordination and PWR1 seated, pt may benefit from updated exercises PRN     OT SHORT TERM GOAL #2   Title Pt will verbalize understanding of adapted strategies for ADLs/IADLS.   Time 5   Period Weeks   Status On-going  needs additional education.     OT SHORT TERM GOAL #3   Title Pt will demonstrate improved standing balance for ADLs as evidenced by improving standing functional reach to 10 inches or greater for LUE.   Baseline RUE 10.5 inches, LUE 9 inches   Time --   Period Weeks   Status Achieved  11.5     OT SHORT TERM GOAL #4   Title Pt will demonstrate improved bilateral UE functional use for ADLs as evidenced by  increasing box/ blocks by 3 blocks.   Baseline RUE 46 blocks, LUe 47 blocks   Time --   Period Weeks   Status Achieved  RUE 54 blocks LUE 50 blocks  OT Long Term Goals - 02/23/17 1531      OT LONG TERM GOAL #1   Title Pt will verbalize understanding of ways to prevent future PD related complications and verbalize understanding of community resources.   Time 5   Period Weeks   Status New     OT LONG TERM GOAL #2   Title Pt will demonstrate improved LUE fine motor coordination for ADLS as evidenced by decreasing LUE 9 hole peg test  to 29 secs or less.   Time 5   Period Weeks   Status New               Plan - 02/23/17 1526    Clinical Impression Statement Therapist checked pt's short term goals. Pt is progressing yet long term goals have not been fully addressed due to scheduling and pt being out of town. Pt can benefit from additional skilled occupational therapy to maximize pt's safety and indpendence with ADLs/ IADLs. Pt brought in his compression stockings today. Pt was unable to donn using compression hose donner and sock aide. Therapist to message  lymphadema therapist regarding options.   Rehab Potential Good   Current Impairments/barriers affecting progress: freezing episodes   OT Frequency 2x / week   OT Duration --  5 weeks   OT Treatment/Interventions Self-care/ADL training;DME and/or AE instruction;Patient/family education;Balance training;Therapeutic exercises;Therapeutic activities;Therapeutic exercise;Ultrasound;Neuromuscular education;Passive range of motion;Functional Mobility Training;Cognitive remediation/compensation;Visual/perceptual remediation/compensation;Manual Therapy;Energy conservation   Plan work towards long term and unmet goals.   Consulted and Agree with Plan of Care Patient      Patient will benefit from skilled therapeutic intervention in order to improve the following deficits and impairments:  Abnormal gait, Decreased cognition, Decreased knowledge of use of DME, Increased edema, Impaired flexibility, Decreased mobility, Decreased coordination, Decreased activity tolerance, Decreased endurance, Decreased strength, Impaired tone, Impaired UE functional use, Decreased safety awareness, Decreased knowledge of precautions, Decreased balance  Visit Diagnosis: Other symptoms and signs involving the nervous system - Plan: Ot plan of care cert/re-cert  Other symptoms and signs involving the musculoskeletal system - Plan: Ot plan of care cert/re-cert  Other lack of coordination - Plan: Ot plan of care cert/re-cert  Other abnormalities of gait and mobility - Plan: Ot plan of care cert/re-cert  Unsteadiness on feet - Plan: Ot plan of care cert/re-cert    Problem List Patient Active Problem List   Diagnosis Date Noted  . Lymphedema 02/10/2017  . BPH associated with nocturia 05/16/2016  . Senile purpura ([place]) 05/03/2016  . OSA (obstructive sleep apnea) 12/28/2015  . Hypersomnia 11/15/2015  . Cough 11/15/2015  . 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 Dizziness 12/30/2014  . Diastolic dysfunction  42/87/6811  . Former smoker 04/29/2014  . Chronic venous insufficiency 02/20/2014  . Hyperglycemia 08/02/2012  . Obesity 07/13/2010  . History of malignant melanoma. left leg 03/09/2009  . Insomnia 10/03/2007  . Hyperlipidemia 12/13/2006  . Essential hypertension 12/13/2006    Saryiah Bencosme 02/23/2017, 3:45 PM  [facility] [address] [place], [place], [address] Phone: [phone]   Fax:  [phone]  Name: Nicholas [name] MRN: [id number] Date of Birth: 05-26-1946

## 2017-02-23 NOTE — Patient Instructions (Signed)
You have to put in the necessary work to get improvement! I saw your loud "ah" volume decreased from last session 02-08-17.

## 2017-02-23 NOTE — Therapy (Signed)
[facility] [address] [place], [place], [address] Phone: [phone]   Fax:  [phone]  Physical Therapy Treatment  Patient Details  Name: Nicholas [name] MRN: [id number] Date of Birth: 12/26/1945 Referring Provider: Alonza Bogus  Encounter Date: 02/23/2017      PT End of Session - 02/23/17 1912    Visit Number 6   Number of Visits 17   Date for PT Re-Evaluation 03/24/17   Authorization Type [employer] every 10th visit   Authorization Time Period 01/23/17  to 03/24/17   PT Start Time 1107   PT Stop Time 1147   PT Time Calculation (min) 40 min   Activity Tolerance Patient tolerated treatment well   Behavior During Therapy [facility] for tasks assessed/performed      Past Medical History:  Diagnosis Date  . 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 Hyperlipidemia   . Hypertension   . Lymphedema     Past Surgical History:  Procedure Laterality Date  . 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 removal of melanoma of left foot/amputation of toes 1&25 Jul 2006  . WRIST FRACTURE SURGERY     71 years old-set    There were no vitals filed for this visit.      Subjective Assessment - 02/23/17 1109    Subjective It was an experience at the [employer]; I walked probably 1/2 a mile, but I didn't fall.   Pertinent History lymphedema bilateral lower extremities, malignant melanoma lower leg s/p toe amputation L foot; obesity, CHF, OSA   Patient Stated Goals Pt's goal for therapy is to manage Parkinson's disease symptoms.   Currently in Pain? Yes   Pain Score 3    Pain Location Arm   Pain Orientation Right   Pain Descriptors / Indicators Aching   Pain Frequency Intermittent   Aggravating Factors  movement, abduction   Pain Relieving Factors rest            [facility] PT Assessment - 02/23/17 0001      Functional Gait  Assessment   Gait assessed  Yes   Gait Level  Surface Walks 20 ft, slow speed, abnormal gait pattern, evidence for imbalance or deviates 10-15 in outside of the 12 in walkway width. Requires more than 7 sec to ambulate 20 ft.  8.4   Change in Gait Speed Able to smoothly change walking speed without loss of balance or gait deviation. Deviate no more than 6 in outside of the 12 in walkway width.   Gait with Horizontal Head Turns Performs head turns smoothly with no change in gait. Deviates no more than 6 in outside 12 in walkway width   Gait with Vertical Head Turns Performs task with slight change in gait velocity (eg, minor disruption to smooth gait path), deviates 6 - 10 in outside 12 in walkway width or uses assistive device   Gait and Pivot Turn Pivot turns safely within 3 sec and stops quickly with no loss of balance.   Step Over Obstacle Is able to step over one shoe box (4.5 in total height) without changing gait speed. No evidence of imbalance.   Gait with Narrow Base of Support Ambulates less than 4 steps heel to toe or cannot perform without assistance.   Gait with Eyes Closed Walks 20 ft, slow speed, abnormal gait pattern, evidence for imbalance, deviates 10-15 in outside 12 in walkway  width. Requires more than 9 sec to ambulate 20 ft.   Ambulating Backwards Walks 20 ft, slow speed, abnormal gait pattern, evidence for imbalance, deviates 10-15 in outside 12 in walkway width.   Steps Two feet to a stair, must use rail.   Total Score 17                     [facility] Adult PT Treatment/Exercise - 02/23/17 0001      Transfers   Transfers Sit to Stand;Stand to Sit   Sit to Stand 6: Modified independent (Device/Increase time);Without upper extremity assist;From chair/3-in-1;5: Supervision  18", 17" chairs   Stand to Sit 6: Modified independent (Device/Increase time);Without upper extremity assist;To chair/3-in-1  18", 17" chairs   Number of Reps 10 reps;Other reps (comment)  from 17" chair, 5 reps from 18" chair   Transfer  Cueing Pt has one of 10 reps of sit<>stand, where he has decreased forward lean and near return to sitting, but able to come up to stand.  PT reiterated best practice for sit<>stand includes increased forward lean.     Ambulation/Gait   Ambulation/Gait Yes   Ambulation/Gait Assistance 5: Supervision   Ambulation Distance (Feet) 300 Feet   Assistive device Straight cane;None   Gait Pattern Step-through pattern;Decreased arm swing - right;Decreased arm swing - left;Decreased step length - right;Decreased step length - left;Festinating;Wide base of support;Poor foot clearance - left;Poor foot clearance - right   Ambulation Surface Level;Indoor   Pre-Gait Activities Reviewed clockwise and counterclockwise turning, with pt return demo understanding.     Standardized Balance Assessment   Standardized Balance Assessment Timed Up and Go Test     Timed Up and Go Test   TUG Normal TUG   Normal TUG (seconds) 16.44  14.69 sec, 12.47 sec     High Level Balance   High Level Balance Comments Reviewed full HEP, including lateral weightshifting, turns and standing PWR! Moves-PWR! Up, Rock, Twist, and Step x 10 reps each-pt able to return demo understanding, but pt reports not performing at home.  Pt able to verbalize at least 3 strategies to reduce freezing with gait and turns.     With standing PWR! Moves, pt requires tactile and verbal cues for slowed pacing of exercises.             PT Short Term Goals - 02/23/17 1111      PT SHORT TERM GOAL #1   Title Pt will be independent with HEP for improved balance, transfers and gait.  TARGET updated to 02/21/17.    Baseline Pt able to return demo, but is not performing HEP at home.  02/23/17   Time 4   Period Weeks   Status Partially Met     PT SHORT TERM GOAL #2   Title Pt will improve TUG score to less than or equal to 13 seconds for decreased fall risk.   Baseline 12.47 best of 3 trials 02/23/17   Time 4   Period Weeks   Status Partially Met      PT SHORT TERM GOAL #3   Title Pt wil perform at least 8 of 10 reps of sit<>stand transfers from 18 inch surfaces or below, without UE support, independently for improved transfer efficiency and safety.   Time 4   Period Weeks   Status Achieved     PT SHORT TERM GOAL #4   Title Pt will verbalize undersatnding of techniques to reduce freezing episodes with gait and turns  Status Achieved     PT SHORT TERM GOAL #5   Title Pt will improve FGA to at least 16/30 for decreased fall risk.   Baseline 17/30 on 02/23/17   Time 4   Period Weeks   Status Achieved           PT Long Term Goals - 02/23/17 1915      PT LONG TERM GOAL #1   Title Pt will verbalize understanding of fall prevention within the home environment.  TARGET updated 03/24/17   Time 8   Period Weeks   Status On-going     PT LONG TERM GOAL #2   Title Pt will improve TUG manual to less than or equal to 18 seconds for decreased fall risk/improved dual tasking with gait.   Time 8   Period Weeks   Status On-going     PT LONG TERM GOAL #3   Title Pt will improve gait velocity with conversation to at least 3 ft/sec for improved gait efficiency and safety.   Time 8   Period Weeks   Status On-going     PT LONG TERM GOAL #4   Title Pt will improve Functional Gait Assessment score to at least 19/30 for decreased fall risk.   Time 8   Period Weeks   Status On-going     PT LONG TERM GOAL #5   Title Pt will verbalize plans for optimal continued community fitness upon D/C from PT.   Time 8   Period Weeks   Status On-going               Plan - 02/23/17 1913    Clinical Impression Statement Skilled PT session focused today on assessing STGs, with STG 1 and 2 partially met; STG 3, 4, and 5 met.  Pt reports and appears to be improving gait with increased ability to manage freezing episodes, but patient has been seen inconsistently due to his schedule conflicts and being out of town.  He has improved on Functional  Gait Assessment and TUG but remains at fall risk.  Pt would continue to benefit from skilled PT to further address balance and gait.   Rehab Potential Good   PT Frequency 2x / week   PT Duration 8 weeks  plus eval   PT Treatment/Interventions ADLs/Self Care Home Management;Functional mobility training;Gait training;DME Instruction;Patient/family education;Neuromuscular re-education;Balance training;Therapeutic exercise;Therapeutic activities   PT Next Visit Plan Consider adding to HEP:  forward/back gait at counter (working on pacing in posterior direction); forward/back step and weightshift; work towards [employer] and Agree with Plan of Care Patient      Patient will benefit from skilled therapeutic intervention in order to improve the following deficits and impairments:  Abnormal gait, Decreased balance, Decreased mobility, Decreased strength, Difficulty walking, Postural dysfunction  Visit Diagnosis: Other abnormalities of gait and mobility  Unsteadiness on feet  Other symptoms and signs involving the nervous system     Problem List Patient Active Problem List   Diagnosis Date Noted  . Lymphedema 02/10/2017  . BPH associated with nocturia 05/16/2016  . Senile purpura ([place]) 05/03/2016  . OSA (obstructive sleep apnea) 12/28/2015  . Hypersomnia 11/15/2015  . Cough 11/15/2015  . 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 Dizziness 12/30/2014  . Diastolic dysfunction 78/29/5621  . Former smoker 04/29/2014  . Chronic venous insufficiency 02/20/2014  . Hyperglycemia 08/02/2012  . Obesity 07/13/2010  . History of malignant melanoma. left leg 03/09/2009  . Insomnia 10/03/2007  .  Hyperlipidemia 12/13/2006  . Essential hypertension 12/13/2006    Dandrea Widdowson W. 02/23/2017, 7:17 PM  [name]., PT   [facility] [address] [place], [place], [address] Phone: [phone]   Fax:  [phone]  Name:  Nicholas [name] MRN: [id number] Date of Birth: March 25, 1946

## 2017-02-27 ENCOUNTER — Ambulatory Visit: Payer: PPO | Admitting: Physical Therapy

## 2017-02-27 DIAGNOSIS — I890 Lymphedema, not elsewhere classified: Secondary | ICD-10-CM

## 2017-02-27 DIAGNOSIS — R2689 Other abnormalities of gait and mobility: Secondary | ICD-10-CM | POA: Diagnosis not present

## 2017-02-27 NOTE — Patient Instructions (Signed)
First of all, check with your insurance company to see if provider is in network   [facility]                                            [address]  [place], [place] [address] [phone]    Does not file for insurance--- call for appointment with Cathy  [facility]   (for wigs and compression sleeves / gloves/gauntlets )  [address] [place], [place] [address] [phone]  Will file some insurances --- call for appointment   [facility] (for mastectomy prosthetics and garments) [address] [place], [place] [address] [phone] Will file some insurances --- call for appointment  [facility]  [address]  , [place] [address] [phone] Lower extremity garments  [facility] [address] [place],   [address] [phone]  [employer] Sales rep:  Matt Lawson:  [phone] [email or web address] Biocompression pumps   [facility]  Sales rep: Robert Rollins:  [phone] [email or web address] Entre and Flexitouch pumps    Other Resources: National Lymphedema Network:  www.lymphnet.org [email or web address] for patient articles and purchase a self manual lymph drainage DVD [email or web address] has informative articles.  

## 2017-02-27 NOTE — Therapy (Signed)
[place], [place], [address] Phone: [phone]   Fax:  [phone]  Physical Therapy Re- Evaluation  Patient Details  Name: Nicholas [name] MRN: [id number] Date of Birth: [birth year] Referring Provider: Dr. [name]   Encounter Date: 02/27/2017      PT End of Session - 02/27/17 1748    Visit Number 7  1 for lymphedema    Number of Visits 18   Date for PT Re-Evaluation 03/24/17  for lymphedema 04/10/2017   PT Start Time 1600   PT Stop Time 1710   PT Time Calculation (min) 70 min   Activity Tolerance Patient tolerated treatment well;Patient limited by pain      Past Medical History:  Diagnosis Date  . Cancer [facility]) Jan 2008   skin; hx of melanoma left foot/ amputation of toes 1&2   . Hyperlipidemia   . Hypertension   . Lymphedema     Past Surgical History:  Procedure Laterality Date  . 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 removal of melanoma of left foot/amputation of toes 1&25 Jul 2006  . WRIST FRACTURE SURGERY     71 years old-set    There were no vitals filed for this visit.       Subjective Assessment - 02/27/17 1732    Subjective Pt reports that he developed lypmphedema in his left lower leg after treatment for melanoma of his left foot that involved inguinal lymph node dissection and chemotherapy.  He then developed lymphedema in his right leg  He was told that it was from vascular impairment from one doctor and that he did not have that from another place. He has compression stockings for both legs but he does not wear the one for his right leg much because it is to hard to get on. He usually wears the stocking on the left leg    Pertinent History lymphedema bilateral lower extremities, malignant melanoma lower leg s/p toe amputation L foot; obesity, CHF, OSA.  He has had prior complete decongestive therapy that included bandaging for right leg, so he is familiar with that  process.  He was given a compression pump from someone, but he has never used it.  He has compression stockings for both legs but he does not wear the one for his right leg much because it is to hard to get on. He usually wears the stocking on the left leg He does not have any type of nighttime garment    Patient Stated Goals Pt's goal for therapy is to manage Parkinson's disease symptoms.  He wants to get his lymphedema under control    Currently in Pain? No/denies            [facility] PT Assessment - 02/27/17 0001      Assessment   Medical Diagnosis lymphedema after melanoma treatment    Referring Provider Dr. [name]    Onset Date/Surgical Date 07/24/06     Precautions   Precautions Fall     Balance Screen   Has the patient fallen in the past 6 months No   Has the patient had a decrease in activity level because of a fear of falling?  No   Is the patient reluctant to leave their home because of a fear of falling?  No     Home Environment   Living Environment Private residence   Living Arrangements Spouse/significant other   Available Help at Discharge Family  Type of [place] to enter   Entrance Stairs-Number of Steps 3     Prior Function   Level of Independence Independent   Leisure Particpates in Parkinson's exercise      Cognition   Overall Cognitive Status Within Functional Limits for tasks assessed     Observation/Other Assessments   Observations Pt obese, with large pitting edema in both legs. right > left.  right lower leg slightly pink  third toe on left foot is amputated    Skin Integrity --  no open areas    Other Surveys  --  Lymphedema Life Impact Scale 19% impaired     Sensation   Light Touch Appears Intact     Posture/Postural Control   Posture/Postural Control Postural limitations   Postural Limitations Rounded Shoulders;Forward head     Palpation   Palpation comment Pitting edema in both lower legs, right > left with  fluid movement perceived in right foot with elevation and slight pressure            LYMPHEDEMA/ONCOLOGY QUESTIONNAIRE - 02/27/17 1641      Type   Cancer Type melanoma between toes 2 and 3      Surgeries   Other Surgery Date 07/24/06  right inguinal node dissection ,6 months for wound to heal    Number Lymph Nodes Removed 5     Treatment   Past Chemotherapy Treatment Yes   Date 12/23/06     What other symptoms do you have   Are you Having Heaviness or Tightness Yes   Are you having Pain No   Are you having pitting edema Yes   Body Site both lower legs    Is it Hard or Difficult finding clothes that fit Yes   Do you have infections No   Is there Decreased scar mobility No   Stemmer Sign Yes     Lymphedema Stage   Stage STAGE 2 SPONTANEOUSLY IRREVERSIBLE  has quite of bit of reduction in right leg left leg constant     Right Lower Extremity Lymphedema   At Midpatella/Popliteal Crease 43.9 cm   30 cm Proximal to Floor at Lateral Plantar Foot 49 cm   20 cm Proximal to Floor at Lateral Plantar Foot 42.'2 1   10 ' cm Proximal to Floor at Lateral Malleoli 38 cm   5 cm Proximal to 1st MTP Joint 30 cm   Across MTP Joint 26.5 cm   Around Proximal Great Toe 9 cm   Other improvement with elevation of lower leg up on bolster,      Left Lower Extremity Lymphedema   At Midpatella/Popliteal Crease 40.59 cm   30 cm Proximal to Floor at Lateral Plantar Foot 48 cm   20 cm Proximal to Floor at Lateral Plantar Foot 41 cm   10 cm Proximal to Floor at Lateral Malleoli 34 cm   5 cm Proximal to 1st MTP Joint 28 cm   Across MTP Joint 27 cm   Around Proximal Great Toe 9 cm         Objective measurements completed on examination: See above findings.          [place] Adult PT Treatment/Exercise - 02/27/17 0001      Self-Care   Self-Care Other Self-Care Comments   Other Self-Care Comments  long discussion of lymph system, complete decongestive components. compression bandaging  options .  Issued medium tg soft to right leg for temporarty managment.  Pt will  contact [facility] for Circ Aid Reduction kit                 PT Education - 02/27/17 1748    Education provided Yes   Education Details see self care section.    Person(s) Educated Patient;Spouse   Methods Explanation;Handout   Comprehension Verbalized understanding          PT Short Term Goals - 02/23/17 1111      PT SHORT TERM GOAL #1   Title Pt will be independent with HEP for improved balance, transfers and gait.  TARGET updated to 02/21/17.    Baseline Pt able to return demo, but is not performing HEP at home.  02/23/17   Time 4   Period Weeks   Status Partially Met     PT SHORT TERM GOAL #2   Title Pt will improve TUG score to less than or equal to 13 seconds for decreased fall risk.   Baseline 12.47 best of 3 trials 02/23/17   Time 4   Period Weeks   Status Partially Met     PT SHORT TERM GOAL #3   Title Pt wil perform at least 8 of 10 reps of sit<>stand transfers from 18 inch surfaces or below, without UE support, independently for improved transfer efficiency and safety.   Time 4   Period Weeks   Status Achieved     PT SHORT TERM GOAL #4   Title Pt will verbalize undersatnding of techniques to reduce freezing episodes with gait and turns   Status Achieved     PT SHORT TERM GOAL #5   Title Pt will improve FGA to at least 16/30 for decreased fall risk.   Baseline 17/30 on 02/23/17   Time 4   Period Weeks   Status Achieved         Short Term Clinic Goals - 02/27/17 1807      CC Short Term Goal  #1   Title pt will have reducton of right leg at 10 cm proximal to lateral foot to 36 cm    Baseline 38 cm    Time 3   Period Weeks   Status New     CC Short Term Goal  #2   Title pt will have reduction of left leg at 10 cm proximal to lateral foot to 33 cm    Baseline 34 cm    Time 3   Period Weeks   Status New          PT Long Term Goals - 02/23/17 1915      PT  LONG TERM GOAL #1   Title Pt will verbalize understanding of fall prevention within the home environment.  TARGET updated 03/24/17   Time 8   Period Weeks   Status On-going     PT LONG TERM GOAL #2   Title Pt will improve TUG manual to less than or equal to 18 seconds for decreased fall risk/improved dual tasking with gait.   Time 8   Period Weeks   Status On-going     PT LONG TERM GOAL #3   Title Pt will improve gait velocity with conversation to at least 3 ft/sec for improved gait efficiency and safety.   Time 8   Period Weeks   Status On-going     PT LONG TERM GOAL #4   Title Pt will improve Functional Gait Assessment score to at least 19/30 for decreased fall risk.   Time 8  Period Weeks   Status On-going     PT LONG TERM GOAL #5   Title Pt will verbalize plans for optimal continued community fitness upon D/C from PT.   Time 8   Period Weeks   Status On-going           Long Term Clinic Goals - 02/27/17 1816      CC Long Term Goal  #1   Title Pt will have strategy for long term managment of lymphedema including intermittent compression pump and compression stockings.    Time 6   Period Weeks   Status New     CC Long Term Goal  #2   Title pt will have reduction of right leg at 10 cm proximal to lateral foot to 34 cm    Baseline 38 cm    Period Weeks     CC Long Term Goal  #3   Title pt will have reduction of left leg at 10 cm proximal to lateral foot to 32 cm    Baseline 34   Time 6   Period Weeks   Status New             Plan - 02/27/17 1749    Clinical Impression Statement Pt presents with bilateral lower extremity lymphedema.  Pt feels that it is in his lower legs and wants to focus treatment below the knees. His right leg is larger that his left and has more pitting edema, therefore should respond well to comporession bandaging.  Pt and his wife are familiar with managment of lymphedema from previous experience and have done bandaging in the past.  Anatomy of the lymph system and complete decongestive therapy practices were reviewed with emphasis on elevation, exercise and daily compression.  Dangers of progression of lymphedema and cellulitis if his condition was not managed were emphasized.   They would rather not do bandaging again as it requires 75/9 application and 3x a week visits, but would like to try the CircAid reduction kit as an adjustbale velcro alternative. They will contact [facility] for fitting at their earliest convenience and follow up her for adjustments as he reduces.  They also have a compression pump that was given to them.  They will take a picture and email it to me to see if it is worthwhile trying to see if it will work for them.  He will continue to do exercise with Neuro PT and OT.  When he reduces we will help him get flat knit garments to better contain his lymphedema moving forward    Rehab Potential Good   Clinical Impairments Affecting Rehab Potential previous left inguinal node dissection., longstanding lymphedema    PT Frequency 2x / week   PT Duration 6 weeks   PT Next Visit Plan Consider adding to HEP:  forward/back gait at counter (working on pacing in posterior direction); forward/back step and weightshift; work towards [place], for lymphedema, remeasure,  check and refit reduction kit, check compression pump, do Manual lymph draiange   Consulted and Agree with Plan of Care Patient;Family member/caregiver   Family Member Consulted wife      Patient will benefit from skilled therapeutic intervention in order to improve the following deficits and impairments:  Abnormal gait, Decreased balance, Decreased mobility, Decreased strength, Difficulty walking, Postural dysfunction, Increased edema, Decreased knowledge of precautions, Decreased knowledge of use of DME, Obesity  Visit Diagnosis: Lymphedema, not elsewhere classified - Plan: PT plan of care cert/re-cert  G-Codes - 02/27/17 1812    Functional  Assessment Tool Used (Outpatient Only) Lymphedema life impact scale  19% impaired    Functional Limitation Self care   Self Care Current Status (N4068) At least 1 percent but less than 20 percent impaired, limited or restricted   Self Care Goal Status (E0335) At least 1 percent but less than 20 percent impaired, limited or restricted   Self Care Discharge Status [phone]) At least 1 percent but less than 20 percent impaired, limited or restricted       Problem List Patient Active Problem List   Diagnosis Date Noted  . Lymphedema 02/10/2017  . BPH associated with nocturia 05/16/2016  . Senile purpura ([place]) 05/03/2016  . OSA (obstructive sleep apnea) 12/28/2015  . Hypersomnia 11/15/2015  . Cough 11/15/2015  . 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 Dizziness 12/30/2014  . Diastolic dysfunction 09/92/7800  . Former smoker 04/29/2014  . Chronic venous insufficiency 02/20/2014  . Hyperglycemia 08/02/2012  . Obesity 07/13/2010  . History of malignant melanoma. left leg 03/09/2009  . Insomnia 10/03/2007  . Hyperlipidemia 12/13/2006  . Essential hypertension 12/13/2006    [name] 02/27/2017, 6:20 PM  [place], [place], [address] Phone: [phone]   Fax:  [phone]  Name: Nicholas [name] MRN: [id number] Date of Birth: [birth year]

## 2017-02-28 ENCOUNTER — Ambulatory Visit: Payer: PPO | Admitting: Occupational Therapy

## 2017-02-28 ENCOUNTER — Ambulatory Visit: Payer: PPO

## 2017-02-28 ENCOUNTER — Ambulatory Visit: Payer: PPO | Admitting: Physical Therapy

## 2017-02-28 DIAGNOSIS — R29818 Other symptoms and signs involving the nervous system: Secondary | ICD-10-CM

## 2017-02-28 DIAGNOSIS — R2689 Other abnormalities of gait and mobility: Secondary | ICD-10-CM

## 2017-02-28 DIAGNOSIS — R29898 Other symptoms and signs involving the musculoskeletal system: Secondary | ICD-10-CM

## 2017-02-28 DIAGNOSIS — R41841 Cognitive communication deficit: Secondary | ICD-10-CM

## 2017-02-28 DIAGNOSIS — R278 Other lack of coordination: Secondary | ICD-10-CM

## 2017-02-28 DIAGNOSIS — R471 Dysarthria and anarthria: Secondary | ICD-10-CM

## 2017-02-28 DIAGNOSIS — R2681 Unsteadiness on feet: Secondary | ICD-10-CM

## 2017-02-28 NOTE — Therapy (Signed)
[facility] [address] [place], [place], [address] Phone: [phone]   Fax:  [phone]  Occupational Therapy Evaluation  Patient Details  Name: Nicholas [name] MRN: [id number] Date of Birth: [birth year] Referring Provider: Dr. Carles Collet  Encounter Date: 02/28/2017      OT End of Session - 02/28/17 1216    Visit Number 8   Number of Visits 17   Date for OT Re-Evaluation 03/30/17   Authorization Type [employer], G code needed   Authorization Time Period 02/23/17-04/23/17   Authorization - Visit Number 8   Authorization - Number of Visits 10   OT Start Time 6962   OT Stop Time 1056   OT Time Calculation (min) 38 min   Activity Tolerance Patient tolerated treatment well   Behavior During Therapy [facility] for tasks assessed/performed      Past Medical History:  Diagnosis Date  . 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 Hyperlipidemia   . Hypertension   . Lymphedema     Past Surgical History:  Procedure Laterality Date  . 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 removal of melanoma of left foot/amputation of toes 1&25 Jul 2006  . WRIST FRACTURE SURGERY     71 years old-set    There were no vitals filed for this visit.      Subjective Assessment - 02/28/17 1214    Subjective  Pt reports that his lympahdema therapist is sending him to get a velcro wrap for RLE   Patient Stated Goals fluid movement, maintain independence   Currently in Pain? No/denies              Treatment: PWR! Basic 4 in seated 10 reps each, min v.c./ demonstration. Stretches to help pt with dressing, reaching for ankles x 10 reps each leg, then in seated lifting leg over gait belt to assist with donning pants x 10 reps. Pt practiced using long handled shoe horn to don left shoe, min v.c PWR! Hands, basic 10-26-08 reps each exercise, min v.c for positioning. Pt reports pain at right biceps, ice  massage performed, with minimal relief.no adverse reactions. Pt practiced donning doffing jacket with big movements and returned demonstration of technique. Fastening buttons/ unfastening with min v.c for technique, increased time required. Therapist reviewed strategies with pt to minimize freezing:rocking side to side when preparing to move, counting, visualizing an object off in the distance, taking big steps, pt verbalized understanding and demonstrates improved performance.                OT Short Term Goals - 02/23/17 1038      OT SHORT TERM GOAL #1   Title I with updated PD specific HEP- due 02/09/17   Time --   Period Weeks   Status Achieved  met pt verbalizes understanding of coordination and PWR1 seated, pt may benefit from updated exercises PRN     OT SHORT TERM GOAL #2   Title Pt will verbalize understanding of adapted strategies for ADLs/IADLS.   Time 5   Period Weeks   Status On-going  needs additional education.     OT SHORT TERM GOAL #3   Title Pt will demonstrate improved standing balance for ADLs as evidenced by improving standing functional reach to 10 inches or greater for LUE.   Baseline RUE 10.5 inches, LUE 9 inches   Time --   Period Weeks  Status Achieved  11.5     OT SHORT TERM GOAL #4   Title Pt will demonstrate improved bilateral UE functional use for ADLs as evidenced by  increasing box/ blocks by 3 blocks.   Baseline RUE 46 blocks, LUe 47 blocks   Time --   Period Weeks   Status Achieved  RUE 54 blocks LUE 50 blocks         Short Term Clinic Goals - 02/27/17 1807      CC Short Term Goal  #1   Title pt will have reducton of right leg at 10 cm proximal to lateral foot to 36 cm    Baseline 38 cm    Time 3   Period Weeks   Status New     CC Short Term Goal  #2   Title pt will have reduction of left leg at 10 cm proximal to lateral foot to 33 cm    Baseline 34 cm    Time 3   Period Weeks   Status New          OT Long Term  Goals - 02/28/17 1020      OT LONG TERM GOAL #1   Title Pt will verbalize understanding of ways to prevent future PD related complications and verbalize understanding of community resources. due 03/30/17   Time 5   Period Weeks   Status New   Target Date 03/30/17     OT LONG TERM GOAL #2   Title Pt will demonstrate improved LUE fine motor coordination for ADLS as evidenced by decreasing LUE 9 hole peg test  to 29 secs or less.   Time 5   Period Weeks   Status New           Long Term Clinic Goals - 02/27/17 1816      CC Long Term Goal  #1   Title Pt will have strategy for long term managment of lymphedema including intermittent compression pump and compression stockings.    Time 6   Period Weeks   Status New     CC Long Term Goal  #2   Title pt will have reduction of right leg at 10 cm proximal to lateral foot to 34 cm    Baseline 38 cm    Period Weeks     CC Long Term Goal  #3   Title pt will have reduction of left leg at 10 cm proximal to lateral foot to 32 cm    Baseline 34   Time 6   Period Weeks   Status New            Plan - 02/28/17 1219    Clinical Impression Statement Pt is progressing towards goals . Pt can benefit from reinforcement of strategies to minimize frezing episodes.   Rehab Potential Good   Current Impairments/barriers affecting progress: freezing episodes   OT Frequency 2x / week   OT Duration --  5 weeks   OT Treatment/Interventions Self-care/ADL training;DME and/or AE instruction;Patient/family education;Balance training;Therapeutic exercises;Therapeutic activities;Therapeutic exercise;Ultrasound;Neuromuscular education;Passive range of motion;Functional Mobility Training;Cognitive remediation/compensation;Visual/perceptual remediation/compensation;Manual Therapy;Energy conservation   Plan dynamic step and reach with big movements, prompt to minimize freezing, fine motor coordination tasks(grooved pegs or copy design)      Patient will  benefit from skilled therapeutic intervention in order to improve the following deficits and impairments:  Abnormal gait, Decreased cognition, Decreased knowledge of use of DME, Increased edema, Impaired flexibility, Decreased mobility, Decreased coordination, Decreased activity tolerance, Decreased  endurance, Decreased strength, Impaired tone, Impaired UE functional use, Decreased safety awareness, Decreased knowledge of precautions, Decreased balance  Visit Diagnosis: Other symptoms and signs involving the nervous system  Other symptoms and signs involving the musculoskeletal system  Other lack of coordination  Other abnormalities of gait and mobility    Problem List Patient Active Problem List   Diagnosis Date Noted  . Lymphedema 02/10/2017  . BPH associated with nocturia 05/16/2016  . Senile purpura ([place]) 05/03/2016  . OSA (obstructive sleep apnea) 12/28/2015  . Hypersomnia 11/15/2015  . Cough 11/15/2015  . 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 Dizziness 12/30/2014  . Diastolic dysfunction 12/22/5613  . Former smoker 04/29/2014  . Chronic venous insufficiency 02/20/2014  . Hyperglycemia 08/02/2012  . Obesity 07/13/2010  . History of malignant melanoma. left leg 03/09/2009  . Insomnia 10/03/2007  . Hyperlipidemia 12/13/2006  . Essential hypertension 12/13/2006    Nicholas [name] 02/28/2017, 12:21 PM  [facility] [address] [place], [place], [address] Phone: [phone]   Fax:  [phone]  Name: Nicholas [name] MRN: [id number] Date of Birth: 17-Oct-1945

## 2017-02-28 NOTE — Patient Instructions (Signed)
Updated HEP 1/4 and 1/2 turn exercise to include walking short distances forward, backward, or sideways between turns (staying beside a counter).

## 2017-02-28 NOTE — Therapy (Signed)
[facility] [address] [place], [place], [address] Phone: [phone]   Fax:  [phone]  Physical Therapy Treatment  Patient Details  Name: Nicholas [name] MRN: [id number] Date of Birth: Sep 22, 1945 Referring Provider: Dr. [name]   Encounter Date: 02/28/2017      PT End of Session - 02/28/17 1327    Visit Number 8     Number of Visits 18   Date for PT Re-Evaluation 03/24/17  for lymphedema 04/10/2017   Authorization Type [employer] every 10th visit   Authorization Time Period 01/23/17  to 03/24/17   PT Start Time 0851   PT Stop Time 0931   PT Time Calculation (min) 40 min   Activity Tolerance Patient tolerated treatment well;Patient limited by pain   Behavior During Therapy [facility] for tasks assessed/performed      Past Medical History:  Diagnosis Date  . Cancer [facility]) Jan 2008   skin; hx of melanoma left foot/ amputation of toes 1&2   . Hyperlipidemia   . Hypertension   . Lymphedema     Past Surgical History:  Procedure Laterality Date  . 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 removal of melanoma of left foot/amputation of toes 1&25 Jul 2006  . WRIST FRACTURE SURGERY     71 years old-set    There were no vitals filed for this visit.      Subjective Assessment - 02/28/17 0853    Subjective No changes since last visit. No falls. [facility]; may need to change some appts due to 2x/wk at both sites now.   Pertinent History lymphedema bilateral lower extremities, malignant melanoma lower leg s/p toe amputation L foot; obesity, CHF, OSA   Patient Stated Goals Pt's goal for therapy is to manage Parkinson's disease symptoms.   Currently in Pain? No/denies                         [facility] Adult PT Treatment/Exercise - 02/28/17 1318      Transfers   Transfers Sit to Stand;Stand to Sit  sitting at table, chair with armrests   Sit to Stand 6: Modified independent  (Device/Increase time);With upper extremity assist;Without upper extremity assist   Stand to Sit 6: Modified independent (Device/Increase time);With upper extremity assist;Without upper extremity assist   Number of Reps 10 reps   Comments various surface heights (lowest 15")     Ambulation/Gait   Ambulation/Gait Yes   Ambulation/Gait Assistance 5: Supervision;4: Min guard   Ambulation/Gait Assistance Details caught left foot x 1 with trip and pt caught his balance with use of can and large step; worked on tasks that create freezing or hastening of gait and techniques to reduce this (approaching a surface to sit; narrow passages)   Ambulation Distance (Feet) 300 Feet  120, 100   Assistive device Straight cane;None   Gait Pattern Step-through pattern;Decreased arm swing - right;Decreased arm swing - left;Decreased step length - right;Decreased step length - left;Festinating;Wide base of support;Poor foot clearance - left;Poor foot clearance - right   Ambulation Surface Level;Indoor   Pre-Gait Activities 1/4 and 1/2 turns combined with short distance ambulation (beside counter) forwards, backwards, and sideways; upgraded HEP 1/4 and 1/2 turns to include walking short distances between turns as done in clinic                PT Education - 02/28/17 1326    Education provided  Yes   Education Details see pt instruction for HEP; tips to reduce freezing and techniques to use when freezing or hastening occurs   Person(s) Educated Patient   Methods Explanation;Demonstration;Verbal cues   Comprehension Verbalized understanding;Returned demonstration;Verbal cues required;Need further instruction          PT Short Term Goals - 02/23/17 1111      PT SHORT TERM GOAL #1   Title Pt will be independent with HEP for improved balance, transfers and gait.  TARGET updated to 02/21/17.    Baseline Pt able to return demo, but is not performing HEP at home.  02/23/17   Time 4   Period Weeks   Status  Partially Met     PT SHORT TERM GOAL #2   Title Pt will improve TUG score to less than or equal to 13 seconds for decreased fall risk.   Baseline 12.47 best of 3 trials 02/23/17   Time 4   Period Weeks   Status Partially Met     PT SHORT TERM GOAL #3   Title Pt wil perform at least 8 of 10 reps of sit<>stand transfers from 18 inch surfaces or below, without UE support, independently for improved transfer efficiency and safety.   Time 4   Period Weeks   Status Achieved     PT SHORT TERM GOAL #4   Title Pt will verbalize undersatnding of techniques to reduce freezing episodes with gait and turns   Status Achieved     PT SHORT TERM GOAL #5   Title Pt will improve FGA to at least 16/30 for decreased fall risk.   Baseline 17/30 on 02/23/17   Time 4   Period Weeks   Status Achieved           PT Long Term Goals - 02/23/17 1915      PT LONG TERM GOAL #1   Title Pt will verbalize understanding of fall prevention within the home environment.  TARGET updated 03/24/17   Time 8   Period Weeks   Status On-going     PT LONG TERM GOAL #2   Title Pt will improve TUG manual to less than or equal to 18 seconds for decreased fall risk/improved dual tasking with gait.   Time 8   Period Weeks   Status On-going     PT LONG TERM GOAL #3   Title Pt will improve gait velocity with conversation to at least 3 ft/sec for improved gait efficiency and safety.   Time 8   Period Weeks   Status On-going     PT LONG TERM GOAL #4   Title Pt will improve Functional Gait Assessment score to at least 19/30 for decreased fall risk.   Time 8   Period Weeks   Status On-going     PT LONG TERM GOAL #5   Title Pt will verbalize plans for optimal continued community fitness upon D/C from PT.   Time 8   Period Weeks   Status On-going               Plan - 02/28/17 1329    Clinical Impression Statement Session focused on updating HEP to increase difficulty for exercise that pt reported was not  challenging. Also focused on putting into practice techniques to reduce freezing and how to begin walking successfully with large steps (very difficult for pt). Patient can state techniques he could use when freezing or hastening occurs, however found when distracted he does not implement  techniques independently and needs verbal and visual cues.    Rehab Potential Good   PT Frequency 2x / week   PT Duration 8 weeks  plus eval   PT Treatment/Interventions ADLs/Self Care Home Management;Functional mobility training;Gait training;DME Instruction;Patient/family education;Neuromuscular re-education;Balance training;Therapeutic exercise;Therapeutic activities   PT Next Visit Plan Provide information on walking program (discussed 8/7 but forgot to print):  forward/back gait at counter (working on pacing in posterior direction); forward/back step and weightshift; work towards [employer] and Agree with Plan of Care Patient      Patient will benefit from skilled therapeutic intervention in order to improve the following deficits and impairments:  Abnormal gait, Decreased balance, Decreased mobility, Decreased strength, Difficulty walking, Postural dysfunction  Visit Diagnosis: Other symptoms and signs involving the nervous system  Other abnormalities of gait and mobility  Unsteadiness on feet     Problem List Patient Active Problem List   Diagnosis Date Noted  . Lymphedema 02/10/2017  . BPH associated with nocturia 05/16/2016  . Senile purpura ([place]) 05/03/2016  . OSA (obstructive sleep apnea) 12/28/2015  . Hypersomnia 11/15/2015  . Cough 11/15/2015  . 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 Dizziness 12/30/2014  . Diastolic dysfunction 33/58/2518  . Former smoker 04/29/2014  . Chronic venous insufficiency 02/20/2014  . Hyperglycemia 08/02/2012  . Obesity 07/13/2010  . History of malignant melanoma. left leg 03/09/2009  . Insomnia 10/03/2007  . Hyperlipidemia 12/13/2006  .  Essential hypertension 12/13/2006    Rexanne Mano, PT 02/28/2017, 1:34 PM  [facility] [address] [place], [place], [address] Phone: [phone]   Fax:  [phone]  Name: Nicholas [name] MRN: [id number] Date of Birth: 08-Feb-1946

## 2017-03-01 NOTE — Patient Instructions (Signed)
  Please complete the assigned speech therapy homework prior to your next session and return it to the speech therapist at your next visit.  

## 2017-03-01 NOTE — Therapy (Signed)
[facility] [address] [place], [place], [address] Phone: [phone]   Fax:  [phone]  Speech Language Pathology Treatment  Patient Details  Name: Nicholas [name] MRN: [id number] Date of Birth: [birth year] Referring Provider: Alonza Bogus, DO  Encounter Date: 02/28/2017      End of Session - 03/01/17 0855    Visit Number 8   Number of Visits 17   Date for SLP Re-Evaluation 03/30/17   SLP Start Time 0935   SLP Stop Time  3557   SLP Time Calculation (min) 40 min   Activity Tolerance Patient tolerated treatment well      Past Medical History:  Diagnosis Date  . Cancer [facility]) Jan 2008   skin; hx of melanoma left foot/ amputation of toes 1&2   . Hyperlipidemia   . Hypertension   . Lymphedema     Past Surgical History:  Procedure Laterality Date  . 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 removal of melanoma of left foot/amputation of toes 1&25 Jul 2006  . WRIST FRACTURE SURGERY     71 years old-set    There were no vitals filed for this visit.      Subjective Assessment - 02/28/17 0942    Subjective "I've done 5 reps 5 times each day."   Currently in Pain? No/denies               ADULT SLP TREATMENT - 03/01/17 0001      General Information   Behavior/Cognition Alert;Cooperative;Pleasant mood     Treatment Provided   Treatment provided Cognitive-Linquistic     Cognitive-Linquistic Treatment   Treatment focused on Dysarthria   Skilled Treatment Loud /a/ today to recalibrate conversational loudness to WNL averaged low to mid 90s dB overall. Simple structured sentence responses were measured at low 70s dB. In semi structured tasks with sentence responses pt was independent with loudness but SLP req'd to provide min A occasionally as task progressed. SLP told pt that his responses were much more consistent than last sessions. Pt responded that he had practiced over the weekend.      Assessment /  Recommendations / Plan   Plan Continue with current plan of care     Progression Toward Goals   Progression toward goals Progressing toward goals            SLP Short Term Goals - 02/23/17 1443      SLP SHORT TERM GOAL #1   Title pt will sustain loud /a/ at 94dB over 4 consecutive sessions   Period Weeks   Status Not Met     SLP SHORT TERM GOAL #2   Title pt will exhibit volume of 70dB in sentence responses 95%   Status Not Met     SLP SHORT TERM GOAL #3   Title In 7 minutes simple conversation pt will achieve average speech volume of low 70s dB over two sessions   Status Not Met          SLP Long Term Goals - 03/01/17 0856      SLP LONG TERM GOAL #1   Title pt will sustain loud /a/ at average 92dB over 3 consecutive sessions with SBA   Baseline 02-28-17   Time 4   Period Weeks   Status Revised     SLP LONG TERM GOAL #2   Title pt will generate speech in 10 minutes mod complex conversation with average 70dB over three sessions  Time 4   Period Weeks   Status On-going          Plan - 03/01/17 8403    Clinical Impression Statement Pt with speech homework completed over the weekend - pt more consistent with loud /a/ and with loudness practice. Performance today was evidence of this. Continue skilled ST to maximize intellgibility and compensations  cognition for independence and to reduce caregiver burden.   Speech Therapy Frequency 2x / week   Treatment/Interventions Compensatory techniques;Internal/external aids;SLP instruction and feedback;Functional tasks;Patient/family education;Cueing hierarchy;Environmental controls;Cognitive reorganization   Potential to Achieve Goals Good   SLP Home Exercise Plan loud /a/   Consulted and Agree with Plan of Care Patient      Patient will benefit from skilled therapeutic intervention in order to improve the following deficits and impairments:   Dysarthria and anarthria  Cognitive communication deficit    Problem  List Patient Active Problem List   Diagnosis Date Noted  . Lymphedema 02/10/2017  . BPH associated with nocturia 05/16/2016  . Senile purpura ([place]) 05/03/2016  . OSA (obstructive sleep apnea) 12/28/2015  . Hypersomnia 11/15/2015  . Cough 11/15/2015  . 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 Dizziness 12/30/2014  . Diastolic dysfunction 75/43/6067  . Former smoker 04/29/2014  . Chronic venous insufficiency 02/20/2014  . Hyperglycemia 08/02/2012  . Obesity 07/13/2010  . History of malignant melanoma. left leg 03/09/2009  . Insomnia 10/03/2007  . Hyperlipidemia 12/13/2006  . Essential hypertension 12/13/2006    [facility] ,[place], [place]  03/01/2017, 8:57 AM  [facility] [address] [place], [place], [address] Phone: [phone]   Fax:  [phone]   Name: Nicholas [name] MRN: [id number] Date of Birth: Jul 18, 1946

## 2017-03-02 ENCOUNTER — Encounter: Payer: Self-pay | Admitting: Physical Therapy

## 2017-03-02 ENCOUNTER — Ambulatory Visit: Payer: PPO | Admitting: Occupational Therapy

## 2017-03-02 ENCOUNTER — Encounter: Payer: Self-pay | Admitting: Occupational Therapy

## 2017-03-02 ENCOUNTER — Ambulatory Visit: Payer: PPO | Admitting: Physical Therapy

## 2017-03-02 DIAGNOSIS — R2689 Other abnormalities of gait and mobility: Secondary | ICD-10-CM

## 2017-03-02 DIAGNOSIS — R29818 Other symptoms and signs involving the nervous system: Secondary | ICD-10-CM

## 2017-03-02 DIAGNOSIS — R2681 Unsteadiness on feet: Secondary | ICD-10-CM

## 2017-03-02 DIAGNOSIS — R29898 Other symptoms and signs involving the musculoskeletal system: Secondary | ICD-10-CM

## 2017-03-02 DIAGNOSIS — R278 Other lack of coordination: Secondary | ICD-10-CM

## 2017-03-02 NOTE — Therapy (Signed)
[facility] [address] [place], [place], [address] Phone: [phone]   Fax:  [phone]  Occupational Therapy Treatment  Patient Details  Name: Nicholas [name] MRN: [id number] Date of Birth: 10/24/1945 Referring Provider: Dr. Carles Collet  Encounter Date: 03/02/2017      OT End of Session - 03/02/17 1213    Visit Number 9   Number of Visits 17   Date for OT Re-Evaluation 03/30/17   Authorization Type [employer], G code needed   Authorization Time Period 02/23/17-04/23/17   Authorization - Visit Number 9   Authorization - Number of Visits 10   OT Start Time 1147   OT Stop Time 1210  pt stated he had to leave early to call attorney   OT Time Calculation (min) 23 min   Activity Tolerance Patient tolerated treatment well      Past Medical History:  Diagnosis Date  . Cancer [facility]) Jan 2008   skin; hx of melanoma left foot/ amputation of toes 1&2   . Hyperlipidemia   . Hypertension   . Lymphedema     Past Surgical History:  Procedure Laterality Date  . 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 removal of melanoma of left foot/amputation of toes 1&25 Jul 2006  . WRIST FRACTURE SURGERY     71 years old-set    There were no vitals filed for this visit.      Subjective Assessment - 03/02/17 1152    Subjective  I have to leave early I have to meet with my attorney   Patient Stated Goals fluid movement, maintain independence   Currently in Pain? No/denies        Treatment:  Therapeutic activities to focus on big hand movements with flipping cards, throwing scarves, dropping pegs (both UE's).  Incorporated big stepping into scarf activity. Also addressed fine motor control via grooved peg board for both hands. Pt able to recall strategies to use to minimize freezing episodes independently today and observed pt using rocking when transitioning from PT session to OT session. Pt terminated session early stating he needed to  leave to call his attorney.                  PWR [facility]) - 03/02/17 1128    PWR! exercises --  Modified quadruped   PWR! Up x10   PWR! Rock x10   PWR! Twist x10               OT Short Term Goals - 03/02/17 1211      OT SHORT TERM GOAL #1   Title I with updated PD specific HEP- due 02/09/17   Period Weeks   Status Achieved  met pt verbalizes understanding of coordination and PWR1 seated, pt may benefit from updated exercises PRN     OT SHORT TERM GOAL #2   Title Pt will verbalize understanding of adapted strategies for ADLs/IADLS.   Time 5   Period Weeks   Status On-going  needs additional education.     OT SHORT TERM GOAL #3   Title Pt will demonstrate improved standing balance for ADLs as evidenced by improving standing functional reach to 10 inches or greater for LUE.   Baseline RUE 10.5 inches, LUE 9 inches   Period Weeks   Status Achieved  11.5     OT SHORT TERM GOAL #4   Title Pt will demonstrate improved bilateral UE functional use for ADLs as evidenced by  increasing box/ blocks by 3 blocks.   Baseline RUE 46 blocks, LUe 47 blocks   Period Weeks   Status Achieved  RUE 54 blocks LUE 50 blocks         Short Term Clinic Goals - 02/27/17 1807      CC Short Term Goal  #1   Title pt will have reducton of right leg at 10 cm proximal to lateral foot to 36 cm    Baseline 38 cm    Time 3   Period Weeks   Status New     CC Short Term Goal  #2   Title pt will have reduction of left leg at 10 cm proximal to lateral foot to 33 cm    Baseline 34 cm    Time 3   Period Weeks   Status New          OT Long Term Goals - 03/02/17 1211      OT LONG TERM GOAL #1   Title Pt will verbalize understanding of ways to prevent future PD related complications and verbalize understanding of community resources. due 03/30/17   Time 5   Period Weeks   Status New     OT LONG TERM GOAL #2   Title Pt will demonstrate improved LUE fine motor coordination for  ADLS as evidenced by decreasing LUE 9 hole peg test  to 29 secs or less.   Time 5   Period Weeks   Status New           Long Term Clinic Goals - 02/27/17 1816      CC Long Term Goal  #1   Title Pt will have strategy for long term managment of lymphedema including intermittent compression pump and compression stockings.    Time 6   Period Weeks   Status New     CC Long Term Goal  #2   Title pt will have reduction of right leg at 10 cm proximal to lateral foot to 34 cm    Baseline 38 cm    Period Weeks     CC Long Term Goal  #3   Title pt will have reduction of left leg at 10 cm proximal to lateral foot to 32 cm    Baseline 34   Time 6   Period Weeks   Status New            Plan - 03/02/17 1211    Clinical Impression Statement Pt progressing toward goals. Pt able to verbalize strategies to minimize freezing episodes.    Rehab Potential Good   Current Impairments/barriers affecting progress: freezing episodes   OT Frequency 2x / week   OT Duration --  5 weeks   OT Treatment/Interventions Self-care/ADL training;DME and/or AE instruction;Patient/family education;Balance training;Therapeutic exercises;Therapeutic activities;Therapeutic exercise;Ultrasound;Neuromuscular education;Passive range of motion;Functional Mobility Training;Cognitive remediation/compensation;Visual/perceptual remediation/compensation;Manual Therapy;Energy conservation   Plan dynamic step and reach with big movements, prompt to mininize freezing, fine motor tasks   Consulted and Agree with Plan of Care Patient      Patient will benefit from skilled therapeutic intervention in order to improve the following deficits and impairments:  Abnormal gait, Decreased cognition, Decreased knowledge of use of DME, Increased edema, Impaired flexibility, Decreased mobility, Decreased coordination, Decreased activity tolerance, Decreased endurance, Decreased strength, Impaired tone, Impaired UE functional use,  Decreased safety awareness, Decreased knowledge of precautions, Decreased balance  Visit Diagnosis: Other symptoms and signs involving the nervous system  Other abnormalities of gait and mobility  Unsteadiness on feet  Other symptoms and signs involving the musculoskeletal system  Other lack of coordination    Problem List Patient Active Problem List   Diagnosis Date Noted  . Lymphedema 02/10/2017  . BPH associated with nocturia 05/16/2016  . Senile purpura ([place]) 05/03/2016  . OSA (obstructive sleep apnea) 12/28/2015  . Hypersomnia 11/15/2015  . Cough 11/15/2015  . 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 Dizziness 12/30/2014  . Diastolic dysfunction 09/32/6712  . Former smoker 04/29/2014  . Chronic venous insufficiency 02/20/2014  . Hyperglycemia 08/02/2012  . Obesity 07/13/2010  . History of malignant melanoma. left leg 03/09/2009  . Insomnia 10/03/2007  . Hyperlipidemia 12/13/2006  . Essential hypertension 12/13/2006    Quay Burow, OTR/L 03/02/2017, 12:14 PM  [facility] [address] [place], [place], [address] Phone: [phone]   Fax:  [phone]  Name: Nicholas [name] MRN: [id number] Date of Birth: Nov 20, 1945

## 2017-03-02 NOTE — Therapy (Signed)
[facility] [address] [place], [place], [address] Phone: [phone]   Fax:  [phone]  Physical Therapy Treatment  Patient Details  Name: Nicholas [name] MRN: [id number] Date of Birth: 01/03/1946 Referring Provider: Dr. [name]   Encounter Date: 03/02/2017      PT End of Session - 03/02/17 2016    Visit Number 9  1 for lymphedema    Number of Visits 18   Date for PT Re-Evaluation 03/24/17  for lymphedema 04/10/2017   Authorization Type [employer] every 10th visit   Authorization Time Period 01/23/17  to 03/24/17   PT Start Time 1106  late arrival   PT Stop Time 1148   PT Time Calculation (min) 42 min   Equipment Utilized During Treatment Gait belt   Activity Tolerance Patient tolerated treatment well;Patient limited by pain   Behavior During Therapy [facility] for tasks assessed/performed      Past Medical History:  Diagnosis Date  . Cancer [facility]) Jan 2008   skin; hx of melanoma left foot/ amputation of toes 1&2   . Hyperlipidemia   . Hypertension   . Lymphedema     Past Surgical History:  Procedure Laterality Date  . 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 removal of melanoma of left foot/amputation of toes 1&25 Jul 2006  . WRIST FRACTURE SURGERY     71 years old-set    There were no vitals filed for this visit.      Subjective Assessment - 03/02/17 1109    Subjective No changes since last visit. No falls. Still having trouble with his turns. When he previously did his walking program he hated it. He has done PWR! classes with Amy and may go back to that. Continues to go to cycling class 2x/week. Dog tries to exercise with him if he tries to do PWR at home (educated to put dog into her kennel).    Pertinent History lymphedema bilateral lower extremities, malignant melanoma lower leg s/p toe amputation L foot; obesity, CHF, OSA   Patient Stated Goals Pt's goal for therapy is to manage  Parkinson's disease symptoms.   Currently in Pain? No/denies                         [facility] Adult PT Treatment/Exercise - 03/02/17 0001      Transfers   Transfers Sit to Stand;Stand to Sit   Sit to Stand 6: Modified independent (Device/Increase time)   Stand to Sit 6: Modified independent (Device/Increase time)   Comments throughout session as practicing approaching surface and turn to sit     Ambulation/Gait   Ambulation/Gait Yes   Ambulation/Gait Assistance 5: Supervision   Ambulation/Gait Assistance Details scuffing Rt foot; no freezing with turns when he planned ahead and maintained larger steps; freezing happened consistently when standing and intiating gait   Ambulation Distance (Feet) 120 Feet  120, 120   Assistive device Straight cane   Ambulation Surface Level;Indoor   Pre-Gait Activities 1/4 and 1/2 turns combined with short distance ambulation (beside counter) forwards, backwards     Posture/Postural Control   Postural Limitations Rounded Shoulders;Forward head           PWR [facility]) - 03/02/17 2015    PWR! Up x 10   PWR! Rock x10   PWR! Twist x10               PT Short Term Goals - 02/23/17  [place] #1   Title Pt will be independent with HEP for improved balance, transfers and gait.  TARGET updated to 02/21/17.    Baseline Pt able to return demo, but is not performing HEP at home.  02/23/17   Time 4   Period Weeks   Status Partially Met     PT SHORT TERM GOAL #2   Title Pt will improve TUG score to less than or equal to 13 seconds for decreased fall risk.   Baseline 12.47 best of 3 trials 02/23/17   Time 4   Period Weeks   Status Partially Met     PT SHORT TERM GOAL #3   Title Pt wil perform at least 8 of 10 reps of sit<>stand transfers from 18 inch surfaces or below, without UE support, independently for improved transfer efficiency and safety.   Time 4   Period Weeks   Status Achieved     PT SHORT TERM GOAL #4    Title Pt will verbalize undersatnding of techniques to reduce freezing episodes with gait and turns   Status Achieved     PT SHORT TERM GOAL #5   Title Pt will improve FGA to at least 16/30 for decreased fall risk.   Baseline 17/30 on 02/23/17   Time 4   Period Weeks   Status Achieved         Short Term Clinic Goals - 02/27/17 1807      CC Short Term Goal  #1   Title pt will have reducton of right leg at 10 cm proximal to lateral foot to 36 cm    Baseline 38 cm    Time 3   Period Weeks   Status New     CC Short Term Goal  #2   Title pt will have reduction of left leg at 10 cm proximal to lateral foot to 33 cm    Baseline 34 cm    Time 3   Period Weeks   Status New          PT Long Term Goals - 02/23/17 1915      PT LONG TERM GOAL #1   Title Pt will verbalize understanding of fall prevention within the home environment.  TARGET updated 03/24/17   Time 8   Period Weeks   Status On-going     PT LONG TERM GOAL #2   Title Pt will improve TUG manual to less than or equal to 18 seconds for decreased fall risk/improved dual tasking with gait.   Time 8   Period Weeks   Status On-going     PT LONG TERM GOAL #3   Title Pt will improve gait velocity with conversation to at least 3 ft/sec for improved gait efficiency and safety.   Time 8   Period Weeks   Status On-going     PT LONG TERM GOAL #4   Title Pt will improve Functional Gait Assessment score to at least 19/30 for decreased fall risk.   Time 8   Period Weeks   Status On-going     PT LONG TERM GOAL #5   Title Pt will verbalize plans for optimal continued community fitness upon D/C from PT.   Time 8   Period Weeks   Status On-going           Long Term Clinic Goals - 02/27/17 1816      CC Long Term Goal  #1  Title Pt will have strategy for long term managment of lymphedema including intermittent compression pump and compression stockings.    Time 6   Period Weeks   Status New     CC Long Term Goal   #2   Title pt will have reduction of right leg at 10 cm proximal to lateral foot to 34 cm    Baseline 38 cm    Period Weeks     CC Long Term Goal  #3   Title pt will have reduction of left leg at 10 cm proximal to lateral foot to 32 cm    Baseline 34   Time 6   Period Weeks   Status New            Plan - 03/02/17 2019    Clinical Impression Statement Session focused on reviewing recent addition to HEP (pt admitted he did not try it at home on Wednesday due to meeting in [place]), transfer training, and gait training with focus on utilizing freezing tips. He continues to need vc for techniques ~25% of his freezing episodes. Pt can continue to benefit from PT to work towards [place].   Rehab Potential Good   PT Frequency 2x / week   PT Duration 8 weeks  plus eval   PT Treatment/Interventions ADLs/Self Care Home Management;Functional mobility training;Gait training;DME Instruction;Patient/family education;Neuromuscular re-education;Balance training;Therapeutic exercise;Therapeutic activities   PT Next Visit Plan G-code next visit; forward/back gait at counter (working on pacing in posterior direction); forward/back step and weightshift; work towards [employer] and Agree with Plan of Care Patient      Patient will benefit from skilled therapeutic intervention in order to improve the following deficits and impairments:  Abnormal gait, Decreased balance, Decreased mobility, Decreased strength, Difficulty walking, Postural dysfunction  Visit Diagnosis: Other abnormalities of gait and mobility  Unsteadiness on feet  Other symptoms and signs involving the nervous system     Problem List Patient Active Problem List   Diagnosis Date Noted  . Lymphedema 02/10/2017  . BPH associated with nocturia 05/16/2016  . Senile purpura ([place]) 05/03/2016  . OSA (obstructive sleep apnea) 12/28/2015  . Hypersomnia 11/15/2015  . Cough 11/15/2015  . 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 Dizziness 12/30/2014  . Diastolic dysfunction 63/94/3200  . Former smoker 04/29/2014  . Chronic venous insufficiency 02/20/2014  . Hyperglycemia 08/02/2012  . Obesity 07/13/2010  . History of malignant melanoma. left leg 03/09/2009  . Insomnia 10/03/2007  . Hyperlipidemia 12/13/2006  . Essential hypertension 12/13/2006    Rexanne Mano, PT 03/02/2017, 8:28 PM  [facility] [address] [place], [place], [address] Phone: [phone]   Fax:  [phone]  Name: Nicholas [name] MRN: [id number] Date of Birth: May 09, 1946

## 2017-03-07 ENCOUNTER — Ambulatory Visit: Payer: PPO

## 2017-03-07 ENCOUNTER — Ambulatory Visit: Payer: PPO | Admitting: Occupational Therapy

## 2017-03-07 ENCOUNTER — Ambulatory Visit: Payer: PPO | Admitting: Physical Therapy

## 2017-03-07 DIAGNOSIS — R29818 Other symptoms and signs involving the nervous system: Secondary | ICD-10-CM

## 2017-03-07 DIAGNOSIS — R29898 Other symptoms and signs involving the musculoskeletal system: Secondary | ICD-10-CM

## 2017-03-07 DIAGNOSIS — R2689 Other abnormalities of gait and mobility: Secondary | ICD-10-CM

## 2017-03-07 DIAGNOSIS — R41841 Cognitive communication deficit: Secondary | ICD-10-CM

## 2017-03-07 DIAGNOSIS — R278 Other lack of coordination: Secondary | ICD-10-CM

## 2017-03-07 DIAGNOSIS — R2681 Unsteadiness on feet: Secondary | ICD-10-CM

## 2017-03-07 DIAGNOSIS — I890 Lymphedema, not elsewhere classified: Secondary | ICD-10-CM

## 2017-03-07 DIAGNOSIS — R471 Dysarthria and anarthria: Secondary | ICD-10-CM

## 2017-03-07 NOTE — Patient Instructions (Signed)

## 2017-03-07 NOTE — Therapy (Signed)
[place], [place], [address] Phone: [phone]   Fax:  [phone]  Physical Therapy Treatment  Patient Details  Name: Nicholas [name] MRN: [id number] Date of Birth: 1945-12-10 Referring Provider: Dr. [name]   Encounter Date: 03/07/2017      PT End of Session - 03/07/17 1702    Visit Number 11  2 for lymphedema   Number of Visits 18   Date for PT Re-Evaluation 03/24/17  for lymphedema 04/10/17   PT Start Time 1608   PT Stop Time 1654   PT Time Calculation (min) 46 min   Activity Tolerance Patient tolerated treatment well   Behavior During Therapy [facility] for tasks assessed/performed      Past Medical History:  Diagnosis Date  . Cancer [facility]) Jan 2008   skin; hx of melanoma left foot/ amputation of toes 1&2   . Hyperlipidemia   . Hypertension   . Lymphedema     Past Surgical History:  Procedure Laterality Date  . 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 removal of melanoma of left foot/amputation of toes 1&25 Jul 2006  . WRIST FRACTURE SURGERY     71 years old-set    There were no vitals filed for this visit.      Subjective Assessment - 03/07/17 1619    Subjective I've been wearing the TG soft [name] gave me last time and I think it's helped some. I emailed a picture of my pump to [place]. My wife didn't get to make an appointment yet for the velcro garments but we want too.    Pertinent History lymphedema bilateral lower extremities, malignant melanoma lower leg s/p toe amputation L foot; obesity, CHF, OSA   Patient Stated Goals Pt's goal for therapy is to manage Parkinson's disease symptoms.   Currently in Pain? No/denies            LYMPHEDEMA/ONCOLOGY QUESTIONNAIRE - 03/07/17 1622      Right Lower Extremity Lymphedema   At Midpatella/Popliteal Crease 41.8 cm   30 cm Proximal to Floor at Lateral Plantar Foot 50.7 cm   20 cm Proximal to Floor at Lateral Plantar Foot 43.'6  1   10 ' cm Proximal to Floor at Lateral Malleoli 37.2 cm   5 cm Proximal to 1st MTP Joint 27.7 cm   Across MTP Joint 26 cm   Around Proximal Great Toe 9.4 cm                  [facility] Adult PT Treatment/Exercise - 03/07/17 1657      Manual Therapy   Manual Lymphatic Drainage (MLD) In Supine: Short neck, Rt axilla nodes and Rt inguino-axillary anastomosis, and Rt LE from dorsal foot to lateral hip working from proximal to distal then retracing all steps briefly beginning to instruct pt in principles throughout.                        PT Short Term Goals - 02/23/17 1111      PT SHORT TERM GOAL #1   Title Pt will be independent with HEP for improved balance, transfers and gait.  TARGET updated to 02/21/17.    Baseline Pt able to return demo, but is not performing HEP at home.  02/23/17   Time 4   Period Weeks   Status Partially Met     PT SHORT TERM GOAL #2   Title Pt will improve TUG score  to less than or equal to 13 seconds for decreased fall risk.   Baseline 12.47 best of 3 trials 02/23/17   Time 4   Period Weeks   Status Partially Met     PT SHORT TERM GOAL #3   Title Pt wil perform at least 8 of 10 reps of sit<>stand transfers from 18 inch surfaces or below, without UE support, independently for improved transfer efficiency and safety.   Time 4   Period Weeks   Status Achieved     PT SHORT TERM GOAL #4   Title Pt will verbalize undersatnding of techniques to reduce freezing episodes with gait and turns   Status Achieved     PT SHORT TERM GOAL #5   Title Pt will improve FGA to at least 16/30 for decreased fall risk.   Baseline 17/30 on 02/23/17   Time 4   Period Weeks   Status Achieved         Short Term Clinic Goals - 02/27/17 1807      CC Short Term Goal  #1   Title pt will have reducton of right leg at 10 cm proximal to lateral foot to 36 cm    Baseline 38 cm    Time 3   Period Weeks   Status New     CC Short Term Goal  #2   Title pt will  have reduction of left leg at 10 cm proximal to lateral foot to 33 cm    Baseline 34 cm    Time 3   Period Weeks   Status New          PT Long Term Goals - 03/07/17 1206      PT LONG TERM GOAL #1   Title Pt will verbalize understanding of fall prevention within the home environment.  TARGET updated 03/24/17   Time 8   Period Weeks   Status On-going     PT LONG TERM GOAL #2   Title Pt will improve TUG manual to less than or equal to 18 seconds for decreased fall risk/improved dual tasking with gait.   Time 8   Period Weeks   Status Achieved     PT LONG TERM GOAL #3   Title Pt will improve gait velocity with conversation to at least 3 ft/sec for improved gait efficiency and safety.   Time 8   Period Weeks   Status On-going     PT LONG TERM GOAL #4   Title Pt will improve Functional Gait Assessment score to at least 19/30 for decreased fall risk.   Time 8   Period Weeks   Status On-going     PT LONG TERM GOAL #5   Title Pt will verbalize plans for optimal continued community fitness upon D/C from PT.   Time 8   Period Weeks   Status On-going           Long Term Clinic Goals - 02/27/17 1816      CC Long Term Goal  #1   Title Pt will have strategy for long term managment of lymphedema including intermittent compression pump and compression stockings.    Time 6   Period Weeks   Status New     CC Long Term Goal  #2   Title pt will have reduction of right leg at 10 cm proximal to lateral foot to 34 cm    Baseline 38 cm    Period Weeks     CC Long  Term Goal  #3   Title pt will have reduction of left leg at 10 cm proximal to lateral foot to 32 cm    Baseline 34   Time 6   Period Weeks   Status New            Plan - 2017-03-24 1704    Clinical Impression Statement First session of manual lymph drainage today which pt tolerated well. He has been wearing the TG soft to Rt LE since last visit and ptting edema was improved today. Issued another TG soft for Lt LE  to wear in place of his circular knit compression stocking for now as his is too long and applying too much compresion to popliteal fossa and pt reports this very uncomfortable.    Rehab Potential Good   Clinical Impairments Affecting Rehab Potential previous left inguinal node dissection., longstanding lymphedema    PT Frequency 2x / week   PT Duration 6 weeks   PT Treatment/Interventions ADLs/Self Care Home Management;Manual lymph drainage;Compression bandaging;Patient/family education;Orthotic Fit/Training   PT Next Visit Plan Assess if pt got measured for velcro compression garments, if not suggest Bil LE's for this and not just Rt. Cont manual lymph drainage instructing pt throughout.    Recommended Other Services Matt from [employer] was emailed regarding compression pump pt was given from a friend who passed away to see if this could be set up for pt to use.       Patient will benefit from skilled therapeutic intervention in order to improve the following deficits and impairments:  Increased edema, Decreased knowledge of use of DME, Decreased knowledge of precautions  Visit Diagnosis: Lymphedema, not elsewhere classified       G-Codes - Mar 24, 2017 1206    Functional Assessment Tool Used (Outpatient Only) FGA 15/30; 2.66 ft/sec gait velocity, 2.65 ft/sec with conversation; manual TUG 13.28 sec; reports improved ability to use strategies to get out of freezing episodes   Functional Limitation Mobility: Walking and moving around   Mobility: Walking and Moving Around Current Status [phone]) At least 20 percent but less than 40 percent impaired, limited or restricted   Mobility: Walking and Moving Around Goal Status [phone]) At least 20 percent but less than 40 percent impaired, limited or restricted      Problem List Patient Active Problem List   Diagnosis Date Noted  . Lymphedema 02/10/2017  . BPH associated with nocturia 05/16/2016  . Senile purpura ([place]) 05/03/2016  . OSA (obstructive  sleep apnea) 12/28/2015  . Hypersomnia 11/15/2015  . Cough 11/15/2015  . 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 Dizziness 12/30/2014  . Diastolic dysfunction 03/52/4818  . Former smoker 04/29/2014  . Chronic venous insufficiency 02/20/2014  . Hyperglycemia 08/02/2012  . Obesity 07/13/2010  . History of malignant melanoma. left leg 03/09/2009  . Insomnia 10/03/2007  . Hyperlipidemia 12/13/2006  . Essential hypertension 12/13/2006    Otelia Limes, PTA 24-Mar-2017, 5:17 PM  [place], [place], [address] Phone: [phone]   Fax:  [phone]  Name: MARQUAIL BRADWELL MRN: [id number] Date of Birth: July 16, 1946

## 2017-03-07 NOTE — Therapy (Signed)
[facility] [address] [place], [place], [address] Phone: [phone]   Fax:  [phone]  Occupational Therapy Treatment  Patient Details  Name: Nicholas [name] MRN: [id number] Date of Birth: 05/24/1946 Referring Provider: Dr. Carles Collet  Encounter Date: 03/07/2017      OT End of Session - 03/07/17 1228    Visit Number 10   Number of Visits 17   Date for OT Re-Evaluation 03/30/17   Authorization Type [employer], G code needed   Authorization Time Period 02/23/17-04/23/17   Authorization - Visit Number 10   Authorization - Number of Visits 10   OT Start Time 2355   OT Stop Time 1230   OT Time Calculation (min) 39 min      Past Medical History:  Diagnosis Date  . Cancer [facility]) Jan 2008   skin; hx of melanoma left foot/ amputation of toes 1&2   . Hyperlipidemia   . Hypertension   . Lymphedema     Past Surgical History:  Procedure Laterality Date  . 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 removal of melanoma of left foot/amputation of toes 1&25 Jul 2006  . WRIST FRACTURE SURGERY     71 years old-set    There were no vitals filed for this visit.      Subjective Assessment - 03/07/17 1153    Subjective  Pt reports pain in right arm   Patient Stated Goals fluid movement, maintain independence   Currently in Pain? Yes   Pain Score 3    Pain Location Arm   Pain Orientation Right   Pain Descriptors / Indicators Aching   Pain Type Acute pain   Pain Onset More than a month ago   Pain Frequency Intermittent   Aggravating Factors  elbow extension            Treatment:Supine PWR! basic 4 10 reps each, min v.c for larger amplitude movements Seated fine motor coordiantion task to copy small peg design with bilateral UE's for increased fine motor with a cognitive component, min v.c                  OT Education - 03/07/17 1226    Education provided Yes   Education Details community resources,  ways to prevent future PD related  complications   Person(s) Educated Patient   Methods Explanation;Demonstration;Verbal cues;Handout   Comprehension Verbalized understanding          OT Short Term Goals - 03/02/17 1211      OT SHORT TERM GOAL #1   Title I with updated PD specific HEP- due 02/09/17   Period Weeks   Status Achieved  met pt verbalizes understanding of coordination and PWR1 seated, pt may benefit from updated exercises PRN     OT SHORT TERM GOAL #2   Title Pt will verbalize understanding of adapted strategies for ADLs/IADLS.   Time 5   Period Weeks   Status On-going  needs additional education.     OT SHORT TERM GOAL #3   Title Pt will demonstrate improved standing balance for ADLs as evidenced by improving standing functional reach to 10 inches or greater for LUE.   Baseline RUE 10.5 inches, LUE 9 inches   Period Weeks   Status Achieved  11.5     OT SHORT TERM GOAL #4   Title Pt will demonstrate improved bilateral UE functional use for ADLs as evidenced by  increasing box/ blocks by 3 blocks.  Baseline RUE 46 blocks, LUe 47 blocks   Period Weeks   Status Achieved  RUE 54 blocks LUE 50 blocks         Short Term Clinic Goals - 02/27/17 1807      CC Short Term Goal  #1   Title pt will have reducton of right leg at 10 cm proximal to lateral foot to 36 cm    Baseline 38 cm    Time 3   Period Weeks   Status New     CC Short Term Goal  #2   Title pt will have reduction of left leg at 10 cm proximal to lateral foot to 33 cm    Baseline 34 cm    Time 3   Period Weeks   Status New          OT Long Term Goals - Mar 29, 2017 1311      OT LONG TERM GOAL #1   Title Pt will verbalize understanding of ways to prevent future PD related complications and verbalize understanding of community resources. due 03/30/17   Time 5   Period Weeks   Status Achieved     OT LONG TERM GOAL #2   Title Pt will demonstrate improved LUE fine motor coordination for ADLS as  evidenced by decreasing LUE 9 hole peg test  to 29 secs or less.   Time 5   Period Weeks   Status On-going     OT LONG TERM GOAL #3   Title -----------------------------------------------------------------------------------------------------------------------------------------   Status --     OT LONG TERM GOAL #4   Title ---------------------------------------------------------------------------------------   Period --   Status --               Plan - 03-29-17 1305    Clinical Impression Statement Pt is progressing towards long term goals. Anticipate discharge next week.   Rehab Potential Good   Current Impairments/barriers affecting progress: freezing episodes   OT Frequency 2x / week   OT Duration --  5 weeks   OT Treatment/Interventions Self-care/ADL training;DME and/or AE instruction;Patient/family education;Balance training;Therapeutic exercises;Therapeutic activities;Therapeutic exercise;Ultrasound;Neuromuscular education;Passive range of motion;Functional Mobility Training;Cognitive remediation/compensation;Visual/perceptual remediation/compensation;Manual Therapy;Energy conservation   Plan work toward remaining goals. Anticipate d/c next week   Consulted and Agree with Plan of Care Patient      Patient will benefit from skilled therapeutic intervention in order to improve the following deficits and impairments:  Abnormal gait, Decreased cognition, Decreased knowledge of use of DME, Increased edema, Impaired flexibility, Decreased mobility, Decreased coordination, Decreased activity tolerance, Decreased endurance, Decreased strength, Impaired tone, Impaired UE functional use, Decreased safety awareness, Decreased knowledge of precautions, Decreased balance  Visit Diagnosis: Other symptoms and signs involving the nervous system  Other symptoms and signs involving the musculoskeletal system  Other lack of coordination      G-Codes - 2017-03-29 1228    Functional  Assessment Tool Used (Outpatient only) standing functional reach 11.5 for LUE  Box/ blocks RUE  54 blocks     LUE 50 blocks   Functional Limitation Self care   Self Care Current Status (H6073) At least 1 percent but less than 20 percent impaired, limited or restricted   Self Care Goal Status (X1062) At least 1 percent but less than 20 percent impaired, limited or restricted    Occupational Therapy Progress Note  Dates of Reporting Period: 12/19/16 to 29-Mar-2017  Objective Reports of Subjective Statement: see above  Objective Measurements: standing functional reach, box blocks, see above  Goal  Update: Pt is progressing towards all remaining goals.  Plan: Pt can benefit from 1-2 additional weeks of occupational therapy to maximize pt's safety and independence with ADLs/ IADLS.  Reason Skilled Services are Required: Pt can benefit from skilled occupational therapy to address the following deficits: decreased balance, decreased fine motor coordination, rigidity, freezing episodes, arm pain, decreased functional mobility in order to maintain pt's functional independence and quality of life.  Problem List Patient Active Problem List   Diagnosis Date Noted  . Lymphedema 02/10/2017  . BPH associated with nocturia 05/16/2016  . Senile purpura ([place]) 05/03/2016  . OSA (obstructive sleep apnea) 12/28/2015  . Hypersomnia 11/15/2015  . Cough 11/15/2015  . 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 Dizziness 12/30/2014  . Diastolic dysfunction 38/25/0539  . Former smoker 04/29/2014  . Chronic venous insufficiency 02/20/2014  . Hyperglycemia 08/02/2012  . Obesity 07/13/2010  . History of malignant melanoma. left leg 03/09/2009  . Insomnia 10/03/2007  . Hyperlipidemia 12/13/2006  . Essential hypertension 12/13/2006    Geryl Dohn 03/07/2017, 1:14 PM Theone Murdoch, OTR/L [phone] Phone: [phone] 1:15 PM 08/15/18[facility] [address] [place], [place], [address] Phone: [phone]   Fax:  [phone]  Name: Nicholas [name] MRN: [id number] Date of Birth: 1945-09-14

## 2017-03-07 NOTE — Therapy (Addendum)
[facility] [address] [place], [place], [address] Phone: [phone]   Fax:  [phone]  Physical Therapy Treatment  Patient Details  Name: Nicholas [name] MRN: [id number] Date of Birth: [birth year] Referring Provider: Dr. [name]   Encounter Date: 03/07/2017      PT End of Session - 03/07/17 1202    Visit Number 10  1 for lymphedema    Number of Visits 18   Date for PT Re-Evaluation 03/24/17  for lymphedema 04/10/2017   Authorization Type [employer] every 10th visit   Authorization Time Period 01/23/17  to 03/24/17   PT Start Time 1021   PT Stop Time 1102   PT Time Calculation (min) 41 min   Equipment Utilized During Treatment Gait belt   Activity Tolerance Patient tolerated treatment well;Patient limited by pain   Behavior During Therapy [facility] for tasks assessed/performed      Past Medical History:  Diagnosis Date  . Cancer [facility]) Jan 2008   skin; hx of melanoma left foot/ amputation of toes 1&2   . Hyperlipidemia   . Hypertension   . Lymphedema     Past Surgical History:  Procedure Laterality Date  . 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 removal of melanoma of left foot/amputation of toes 1&25 Jul 2006  . WRIST FRACTURE SURGERY     71 years old-set    There were no vitals filed for this visit.      Subjective Assessment - 03/07/17 1024    Subjective No changes since last visit; have a lot of things going on in my mind, so today may not be a good day.  I just don't have time for the exercise classes.   Pertinent History lymphedema bilateral lower extremities, malignant melanoma lower leg s/p toe amputation L foot; obesity, CHF, OSA   Patient Stated Goals Pt's goal for therapy is to manage Parkinson's disease symptoms.   Currently in Pain? Yes   Pain Score 3    Pain Location Arm   Pain Orientation Right   Pain Descriptors / Indicators Aching   Pain Onset More than a month ago   Pain  Frequency Intermittent   Aggravating Factors  elbow extension   Pain Relieving Factors rest   Effect of Pain on Daily Activities Pt is seeing OT            [facility] PT Assessment - 03/07/17 0001      Functional Gait  Assessment   Gait assessed  Yes   Gait Level Surface Walks 20 ft, slow speed, abnormal gait pattern, evidence for imbalance or deviates 10-15 in outside of the 12 in walkway width. Requires more than 7 sec to ambulate 20 ft.  8.8   Change in Gait Speed Able to change speed, demonstrates mild gait deviations, deviates 6-10 in outside of the 12 in walkway width, or no gait deviations, unable to achieve a major change in velocity, or uses a change in velocity, or uses an assistive device.   Gait with Horizontal Head Turns Performs head turns smoothly with slight change in gait velocity (eg, minor disruption to smooth gait path), deviates 6-10 in outside 12 in walkway width, or uses an assistive device.   Gait with Vertical Head Turns Performs task with slight change in gait velocity (eg, minor disruption to smooth gait path), deviates 6 - 10 in outside 12 in walkway width or uses assistive device   Gait and Pivot Turn Pivot  turns safely within 3 sec and stops quickly with no loss of balance.   Step Over Obstacle Is able to step over one shoe box (4.5 in total height) without changing gait speed. No evidence of imbalance.   Gait with Narrow Base of Support Ambulates less than 4 steps heel to toe or cannot perform without assistance.   Gait with Eyes Closed Walks 20 ft, slow speed, abnormal gait pattern, evidence for imbalance, deviates 10-15 in outside 12 in walkway width. Requires more than 9 sec to ambulate 20 ft.  10.4   Ambulating Backwards Walks 20 ft, slow speed, abnormal gait pattern, evidence for imbalance, deviates 10-15 in outside 12 in walkway width.   Steps Two feet to a stair, must use rail.   Total Score 15                     [facility] Adult PT  Treatment/Exercise - 03/07/17 0001      Ambulation/Gait   Ambulation/Gait Yes   Ambulation/Gait Assistance 6: Modified independent (Device/Increase time)   Ambulation Distance (Feet) 120 Feet  x 2   Assistive device Straight cane   Gait Pattern Step-through pattern;Decreased arm swing - right;Decreased arm swing - left;Decreased step length - right;Decreased step length - left;Festinating;Wide base of support;Poor foot clearance - left;Poor foot clearance - right   Ambulation Surface Indoor;Level   Gait velocity 12.3 sec= 2.67 ft/sec; 12.35 sec with conversation = 2.65 ft/sec   Gait Comments Pt has several episodes of festinating upon standing prior to initiating gait.  Pt able to take his time to reset in standing or once, he sits and stands again prior to initiating gait.     Timed Up and Go Test   TUG Normal TUG;Manual TUG;Cognitive TUG   Normal TUG (seconds) 18.06  cane; 11.54 sec 2nd trial no cane   Manual TUG (seconds) 14.56  first attempt, pt stands for 11 seconds not initiating gait   Cognitive TUG (seconds) 13.28     High Level Balance   High Level Balance Activities Side stepping  at counter, 2 reps each direction, cues for incr step length   High Level Balance Comments Forward/back walking at counter, 3 reps, with initial cues for increased step length in posterior direction; forward/back step and weightshift x 12 reps each leg with intermittent UE support.  Cues for increased weightshift into posterior direction for toe raise on front foot.                  PT Short Term Goals - 02/23/17 1111      PT SHORT TERM GOAL #1   Title Pt will be independent with HEP for improved balance, transfers and gait.  TARGET updated to 02/21/17.    Baseline Pt able to return demo, but is not performing HEP at home.  02/23/17   Time 4   Period Weeks   Status Partially Met     PT SHORT TERM GOAL #2   Title Pt will improve TUG score to less than or equal to 13 seconds for decreased  fall risk.   Baseline 12.47 best of 3 trials 02/23/17   Time 4   Period Weeks   Status Partially Met     PT SHORT TERM GOAL #3   Title Pt wil perform at least 8 of 10 reps of sit<>stand transfers from 18 inch surfaces or below, without UE support, independently for improved transfer efficiency and safety.   Time 4  Period Weeks   Status Achieved     PT SHORT TERM GOAL #4   Title Pt will verbalize undersatnding of techniques to reduce freezing episodes with gait and turns   Status Achieved     PT SHORT TERM GOAL #5   Title Pt will improve FGA to at least 16/30 for decreased fall risk.   Baseline 17/30 on 02/23/17   Time 4   Period Weeks   Status Achieved         Short Term Clinic Goals - 02/27/17 1807      CC Short Term Goal  #1   Title pt will have reducton of right leg at 10 cm proximal to lateral foot to 36 cm    Baseline 38 cm    Time 3   Period Weeks   Status New     CC Short Term Goal  #2   Title pt will have reduction of left leg at 10 cm proximal to lateral foot to 33 cm    Baseline 34 cm    Time 3   Period Weeks   Status New          PT Long Term Goals - 03/07/17 1206      PT LONG TERM GOAL #1   Title Pt will verbalize understanding of fall prevention within the home environment.  TARGET updated 03/24/17   Time 8   Period Weeks   Status On-going     PT LONG TERM GOAL #2   Title Pt will improve TUG manual to less than or equal to 18 seconds for decreased fall risk/improved dual tasking with gait.   Time 8   Period Weeks   Status Achieved     PT LONG TERM GOAL #3   Title Pt will improve gait velocity with conversation to at least 3 ft/sec for improved gait efficiency and safety.   Time 8   Period Weeks   Status On-going     PT LONG TERM GOAL #4   Title Pt will improve Functional Gait Assessment score to at least 19/30 for decreased fall risk.   Time 8   Period Weeks   Status On-going     PT LONG TERM GOAL #5   Title Pt will verbalize plans for  optimal continued community fitness upon D/C from PT.   Time 8   Period Weeks   Status On-going           Long Term Clinic Goals - 02/27/17 1816      CC Long Term Goal  #1   Title Pt will have strategy for long term managment of lymphedema including intermittent compression pump and compression stockings.    Time 6   Period Weeks   Status New     CC Long Term Goal  #2   Title pt will have reduction of right leg at 10 cm proximal to lateral foot to 34 cm    Baseline 38 cm    Period Weeks     CC Long Term Goal  #3   Title pt will have reduction of left leg at 10 cm proximal to lateral foot to 32 cm    Baseline 34   Time 6   Period Weeks   Status New            Plan - 03/07/17 1203    Clinical Impression Statement Objective measures checked this visit for g-code; discussed with pt his fluctuations/variations in mobility-he tends to improve TUG  score with multiple trials, but often has slower time or more hesitation on first trial.  Discussed that given pt has HEP and activities to work on at home, may plan to check remaining goals and d/c PT for PD related deficits next visit.   Rehab Potential Good   PT Frequency 2x / week   PT Duration 8 weeks  plus eval   PT Treatment/Interventions ADLs/Self Care Home Management;Functional mobility training;Gait training;DME Instruction;Patient/family education;Neuromuscular re-education;Balance training;Therapeutic exercise;Therapeutic activities   PT Next Visit Plan Check LTGs and review HEP; plan for d/c next visit.   Consulted and Agree with Plan of Care Patient      Patient will benefit from skilled therapeutic intervention in order to improve the following deficits and impairments:  Abnormal gait, Decreased balance, Decreased mobility, Decreased strength, Difficulty walking, Postural dysfunction  Visit Diagnosis: Other abnormalities of gait and mobility  Unsteadiness on feet  Other symptoms and signs involving the nervous  system       G-Codes - 2017-04-02 1206    Functional Assessment Tool Used (Outpatient Only) FGA 15/30; 2.66 ft/sec gait velocity, 2.65 ft/sec with conversation; manual TUG 13.28 sec; reports improved ability to use strategies to get out of freezing episodes   Functional Limitation Mobility: Walking and moving around   Mobility: Walking and Moving Around Current Status [phone]) At least 20 percent but less than 40 percent impaired, limited or restricted   Mobility: Walking and Moving Around Goal Status [phone]) At least 20 percent but less than 40 percent impaired, limited or restricted      Problem List Patient Active Problem List   Diagnosis Date Noted  . Lymphedema 02/10/2017  . BPH associated with nocturia 05/16/2016  . Senile purpura ([place]) 05/03/2016  . OSA (obstructive sleep apnea) 12/28/2015  . Hypersomnia 11/15/2015  . Cough 11/15/2015  . 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 Dizziness 12/30/2014  . Diastolic dysfunction 16/07/930  . Former smoker 04/29/2014  . Chronic venous insufficiency 02/20/2014  . Hyperglycemia 08/02/2012  . Obesity 07/13/2010  . History of malignant melanoma. left leg 03/09/2009  . Insomnia 10/03/2007  . Hyperlipidemia 12/13/2006  . Essential hypertension 12/13/2006    Michel Eskelson W. Apr 02, 2017, 12:08 PM  [name]., PT   [facility] [address] [place], [place], [address] Phone: [phone]   Fax:  [phone]  Name: KAAMIL MOREFIELD MRN: [id number] Date of Birth: 1945-10-15  Physical Therapy Progress Note  Dates of Reporting Period: 12/19/16 to April 02, 2017  Objective Reports of Subjective Statement: Pt reports being able to better use strategies to reduce freezing with gait.  Objective Measurements: FGA 15/30, TUG 2.65 ft/sec with conversation, TUG manual 13.28 sec  Goal Update: See above for STGs and for LTGs.  Pt has met TUG manual LTG.  Plan: Plan for one  additional visit to address fall prevention and transition to community fitness with likely discharge next visit.  Reason Skilled Services are Required: to discuss and plan for discharge next visit.  [name], PT 04/02/17 12:32 PM Phone: [phone] Fax: [phone]

## 2017-03-08 DIAGNOSIS — I890 Lymphedema, not elsewhere classified: Secondary | ICD-10-CM | POA: Diagnosis not present

## 2017-03-08 NOTE — Therapy (Signed)
[facility] [address] [place], [place], [address] Phone: [phone]   Fax:  [phone]  Speech Language Pathology Treatment  Patient Details  Name: Nicholas [name] MRN: [id number] Date of Birth: October 30, 1945 Referring Provider: Alonza Bogus, DO  Encounter Date: 03/07/2017      End of Session - 03/08/17 0828    Visit Number 9   Number of Visits 17   Date for SLP Re-Evaluation 03/30/17   SLP Start Time 1103   SLP Stop Time  1145   SLP Time Calculation (min) 42 min   Activity Tolerance Patient tolerated treatment well      Past Medical History:  Diagnosis Date  . Cancer [facility]) Jan 2008   skin; hx of melanoma left foot/ amputation of toes 1&2   . Hyperlipidemia   . Hypertension   . Lymphedema     Past Surgical History:  Procedure Laterality Date  . 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 removal of melanoma of left foot/amputation of toes 1&25 Jul 2006  . WRIST FRACTURE SURGERY     71 years old-set    There were no vitals filed for this visit.      Subjective Assessment - 03/07/17 1109    Subjective Pt reports adhering to loud /a/ frequency.   Currently in Pain? Yes   Pain Score 3    Pain Location Arm   Pain Orientation Right   Pain Descriptors / Indicators Aching   Pain Type Acute pain   Pain Onset More than a month ago   Pain Frequency Intermittent               ADULT SLP TREATMENT - 03/08/17 0001      General Information   Behavior/Cognition Alert;Cooperative;Pleasant mood     Treatment Provided   Treatment provided Cognitive-Linquistic     Cognitive-Linquistic Treatment   Treatment focused on Dysarthria   Skilled Treatment Loud /a/ today to recalibrate conversational loudness to WNL averaged low to mid 90s dB overall. SLP cued pt for full breath on each rep. Simple structured sentence responses were measured at low 70s dB, with linguistic complexity notably slowing pt response and  hindering volume usually. In semi structured tasks with sentence responses pt loudness was upper 60s dB-near 70dB. Pt rationale was, "I didn't get really good sleep last night."      Assessment / Recommendations / Plan   Plan Continue with current plan of care     Progression Toward Goals   Progression toward goals Progressing toward goals            SLP Short Term Goals - 02/23/17 1443      SLP SHORT TERM GOAL #1   Title pt will sustain loud /a/ at 94dB over 4 consecutive sessions   Period Weeks   Status Not Met     SLP SHORT TERM GOAL #2   Title pt will exhibit volume of 70dB in sentence responses 95%   Status Not Met     SLP SHORT TERM GOAL #3   Title In 7 minutes simple conversation pt will achieve average speech volume of low 70s dB over two sessions   Status Not Met          SLP Long Term Goals - 03/08/17 0831      SLP LONG TERM GOAL #1   Title pt will sustain loud /a/ at average 92dB over 3 consecutive sessions with SBA   Baseline  02-28-17, 03-07-17   Time 3   Period Weeks   Status Revised     SLP LONG TERM GOAL #2   Title pt will generate speech in 10 minutes mod complex conversation with average 70dB over three sessions   Time 3   Period Weeks   Status On-going          Plan - 03/08/17 0829    Clinical Impression Statement Pt, over the weekend, reportedly performed loud /a/ but loudness in speech tasks was not comparable to last session; SLP wonders if pt practiced with speech tasks over the weekend as pt's response when asked if he practiced said he played a "riddle" game around a fire pit. Continue skilled ST to maximize intellgibility and compensations  cognition for independence and to reduce caregiver burden.   Speech Therapy Frequency 2x / week   Treatment/Interventions Compensatory techniques;Internal/external aids;SLP instruction and feedback;Functional tasks;Patient/family education;Cueing hierarchy;Environmental controls;Cognitive reorganization    Potential to Achieve Goals Good   SLP Home Exercise Plan loud /a/   Consulted and Agree with Plan of Care Patient      Patient will benefit from skilled therapeutic intervention in order to improve the following deficits and impairments:   Dysarthria and anarthria  Cognitive communication deficit    Problem List Patient Active Problem List   Diagnosis Date Noted  . Lymphedema 02/10/2017  . BPH associated with nocturia 05/16/2016  . Senile purpura ([place]) 05/03/2016  . OSA (obstructive sleep apnea) 12/28/2015  . Hypersomnia 11/15/2015  . Cough 11/15/2015  . 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 Dizziness 12/30/2014  . Diastolic dysfunction 01/58/6825  . Former smoker 04/29/2014  . Chronic venous insufficiency 02/20/2014  . Hyperglycemia 08/02/2012  . Obesity 07/13/2010  . History of malignant melanoma. left leg 03/09/2009  . Insomnia 10/03/2007  . Hyperlipidemia 12/13/2006  . Essential hypertension 12/13/2006    [facility] ,[place], [place]  03/08/2017, 8:32 AM  [facility] [address] [place], [place], [address] Phone: [phone]   Fax:  [phone]   Name: Nicholas [name] MRN: [id number] Date of Birth: [birth year]

## 2017-03-09 ENCOUNTER — Ambulatory Visit: Payer: PPO

## 2017-03-09 ENCOUNTER — Ambulatory Visit: Payer: PPO | Admitting: Physical Therapy

## 2017-03-09 VITALS — BP 139/86 | HR 95

## 2017-03-09 DIAGNOSIS — R471 Dysarthria and anarthria: Secondary | ICD-10-CM

## 2017-03-09 DIAGNOSIS — R2689 Other abnormalities of gait and mobility: Secondary | ICD-10-CM

## 2017-03-09 DIAGNOSIS — R41841 Cognitive communication deficit: Secondary | ICD-10-CM

## 2017-03-09 DIAGNOSIS — R29818 Other symptoms and signs involving the nervous system: Secondary | ICD-10-CM

## 2017-03-09 DIAGNOSIS — R2681 Unsteadiness on feet: Secondary | ICD-10-CM

## 2017-03-09 NOTE — Therapy (Signed)
[facility] [address] [place], [place], [address] Phone: [phone]   Fax:  [phone]  Speech Language Pathology Treatment  Patient Details  Name: Nicholas [name] MRN: [id number] Date of Birth: [birth year] Referring Provider: Alonza Bogus, DO  Encounter Date: 03/09/2017      End of Session - 03/09/17 1316    Visit Number 10   Number of Visits 17   Date for SLP Re-Evaluation 03/30/17   SLP Start Time 1150   SLP Stop Time  1229   SLP Time Calculation (min) 39 min      Past Medical History:  Diagnosis Date  . Cancer [facility]) Jan 2008   skin; hx of melanoma left foot/ amputation of toes 1&2   . Hyperlipidemia   . Hypertension   . Lymphedema     Past Surgical History:  Procedure Laterality Date  . 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 removal of melanoma of left foot/amputation of toes 1&25 Jul 2006  . WRIST FRACTURE SURGERY     71 years old-set    There were no vitals filed for this visit.      Subjective Assessment - 03/09/17 1157    Subjective "It was the sticky note!" (reason why pt is remembering loud /a/)   Currently in Pain? No/denies               ADULT SLP TREATMENT - 03/09/17 1157      General Information   Behavior/Cognition Alert;Cooperative;Pleasant mood     Treatment Provided   Treatment provided Cognitive-Linquistic     Cognitive-Linquistic Treatment   Treatment focused on Dysarthria   Skilled Treatment Pt with loud /a/ work routinely, but did not do daily homework tasks. SLP stressed daily practice with homework. Loud /a/ average to recalibrate conversational loudness was mid 90s dB. Multi sentnece reponses were measured with average upper 60s dB, SLP told pt that daily practice will make this level more successful in that pt will have had practice at this level.      Assessment / Recommendations / Plan   Plan Continue with current plan of care     Progression Toward  Goals   Progression toward goals Progressing toward goals            SLP Short Term Goals - 02/23/17 1443      SLP SHORT TERM GOAL #1   Title pt will sustain loud /a/ at 94dB over 4 consecutive sessions   Period Weeks   Status Not Met     SLP SHORT TERM GOAL #2   Title pt will exhibit volume of 70dB in sentence responses 95%   Status Not Met     SLP SHORT TERM GOAL #3   Title In 7 minutes simple conversation pt will achieve average speech volume of low 70s dB over two sessions   Status Not Met          SLP Long Term Goals - 03/09/17 1318      SLP LONG TERM GOAL #1   Title pt will sustain loud /a/ at average 92dB over 3 consecutive sessions with SBA   Status Achieved     SLP LONG TERM GOAL #2   Title pt will generate speech in 10 minutes mod complex conversation with average 70dB over three sessions   Time 3   Period Weeks   Status On-going          Plan - 03/09/17 1316  Clinical Impression Statement Pt reportedly performed loud /a/ but did not do daily speech tasks. Pt was told that in order to progress he will need to complete daily practice with speech tasks, not just loud /a/. Continue skilled ST to maximize intellgibility and compensations  cognition for independence and to reduce caregiver burden.   Speech Therapy Frequency 2x / week   Treatment/Interventions Compensatory techniques;Internal/external aids;SLP instruction and feedback;Functional tasks;Patient/family education;Cueing hierarchy;Environmental controls;Cognitive reorganization   Potential to Achieve Goals Good   SLP Home Exercise Plan loud /a/   Consulted and Agree with Plan of Care Patient      Patient will benefit from skilled therapeutic intervention in order to improve the following deficits and impairments:   Dysarthria and anarthria  Cognitive communication deficit   Speech Therapy Progress Note  Dates of Reporting Period: eval date to present  Subjective Statement: Pt has been  seen for 10 visits ST focusing on speech loudness.   Objective Measurements: Pt's loudness with loud /a/ has improved however variable success with sentence and multisentence responses.  Goal Update: See above.  Plan: Cont to see pt for consistency in multisentence/conversational responses.  Reason Skilled Services are Required: Pt's consistency with multisentence responses is not present at this time. Pt requires more therapy to habitualize louder speech.   Problem List Patient Active Problem List   Diagnosis Date Noted  . Lymphedema 02/10/2017  . BPH associated with nocturia 05/16/2016  . Senile purpura ([place]) 05/03/2016  . OSA (obstructive sleep apnea) 12/28/2015  . Hypersomnia 11/15/2015  . Cough 11/15/2015  . 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 Dizziness 12/30/2014  . Diastolic dysfunction 48/27/0786  . Former smoker 04/29/2014  . Chronic venous insufficiency 02/20/2014  . Hyperglycemia 08/02/2012  . Obesity 07/13/2010  . History of malignant melanoma. left leg 03/09/2009  . Insomnia 10/03/2007  . Hyperlipidemia 12/13/2006  . Essential hypertension 12/13/2006    [facility] ,[place], [place]  03/09/2017, 1:18 PM  [facility] [address] [place], [place], [address] Phone: [phone]   Fax:  [phone]   Name: Nicholas [name] MRN: [id number] Date of Birth: March 10, 1946

## 2017-03-09 NOTE — Patient Instructions (Signed)
WALKING  Walking is a great form of exercise to increase your strength, endurance and overall fitness.  A walking program can help you start slowly and gradually build endurance as you go.  Everyone's ability is different, so each person's starting point will be different.  You do not have to follow them exactly.  The are just samples. You should simply find out what's right for you and stick to that program.   In the beginning, you'll start off walking 2-3 times a day for short distances.  As you get stronger, you'll be walking further at just 1-2 times per day.  A. You Can Walk For A Certain Length Of Time Each Day    Walk 3 minutes 3 times per day.  Increase 1-2 minutes every 3 days (3 times per day).  Work up to 12-67minutes (1-2 times per day).   Example:   Day 1-2 3 minutes 3 times per day   Day 7-8 7-8 minutes 2-3 times per day   Day 13-14 12-15 minutes 1-2 times per day  B. You Can Walk For a Certain Distance Each Day     Distance can be substituted for time.    Example:   3 trips to mailbox (at road)   3 trips to corner of block   3 trips around the block

## 2017-03-09 NOTE — Therapy (Addendum)
[facility] [address] [place], [place], [address] Phone: [phone]   Fax:  [phone]  Physical Therapy Treatment  Patient Details  Name: Nicholas [name] MRN: [id number] Date of Birth: [birth year] Referring Provider: Dr. [name]   Encounter Date: 03/09/2017      PT End of Session - 03/09/17 2349    Visit Number 12  2 for lymphedema   Number of Visits 18   Date for PT Re-Evaluation 03/24/17  for lymphedema 04/10/17   Authorization Type [employer] every 10th visit   Authorization Time Period 01/23/17  to 03/24/17   PT Start Time 1104   PT Stop Time 1145   PT Time Calculation (min) 41 min   Activity Tolerance Patient limited by fatigue  c/o lightheadedness during session-see vitals; no other c/o   Behavior During Therapy [facility] for tasks assessed/performed      Past Medical History:  Diagnosis Date  . Cancer [facility]) Jan 2008   skin; hx of melanoma left foot/ amputation of toes 1&2   . Hyperlipidemia   . Hypertension   . Lymphedema     Past Surgical History:  Procedure Laterality Date  . 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 removal of melanoma of left foot/amputation of toes 1&25 Jul 2006  . WRIST FRACTURE SURGERY     71 years old-set    Vitals:   03/09/17 1108 03/09/17 1109 03/09/17 1143  BP: 136/88 (!) 148/91 139/86  Pulse: (!) 101 (!) 102 95        Subjective Assessment - 03/09/17 2342    Subjective Wore the new compresssion stockings for my legs last night sleeping, and I was lightheaded so I took them off and put my old ones on.  Still feel somewhat lightheaded.   Pertinent History lymphedema bilateral lower extremities, malignant melanoma lower leg s/p toe amputation L foot; obesity, CHF, OSA   Patient Stated Goals Pt's goal for therapy is to manage Parkinson's disease symptoms.   Currently in Pain? No/denies   Pain Onset More than a month ago                          [facility] Adult PT Treatment/Exercise - 03/09/17 0001      Ambulation/Gait   Ambulation/Gait Yes   Ambulation/Gait Assistance 6: Modified independent (Device/Increase time)   Ambulation Distance (Feet) 440 Feet  (3 minute indoors); 460 ft (3 min outdoors)   Assistive device Straight cane   Gait Pattern Step-through pattern;Decreased arm swing - right;Decreased arm swing - left;Decreased step length - right;Decreased step length - left;Festinating;Wide base of support;Poor foot clearance - left;Poor foot clearance - right   Ambulation Surface Level;Indoor;Unlevel;Outdoor;Paved   Gait Comments Discussed pt incorporating walking program into exercise routine. Pt states he would rather walk outdoors for walking program (vs indoors).  Timed two 3-minute walks, indoors and outdoors with cane, with pt fatigueing (and quality of gait decreasing) after 3 minutes.  Discussed progressing walking program working up to 10-15 minutes of walking daily.     High Level Balance   High Level Balance Comments Reviewed pt's HEP:  PWR! Up x 10, PWR! Rock x 10, [place]! Twist x 10, PWR! Step x 10 .  Pt performs independently, but does need frequent standing rest breaks.     Self-Care   Self-Care Other Self-Care Comments   Other Self-Care Comments  Reviewed pt's continued fitness routine after d/c from PT:  [employer] cycling class, HEP,a nd walking program.  Discussed fall prevention in home environment.                PT Education - 03/09/17 2348    Education provided Yes   Education Details Fall prevention, ongoing fitness upon d/c; plans for discharge and return screen in 6 months.   Person(s) Educated Patient   Methods Explanation;Handout   Comprehension Verbalized understanding          PT Short Term Goals - 02/23/17 1111      PT SHORT TERM GOAL #1   Title Pt will be independent with HEP for improved balance, transfers and gait.  TARGET updated to 02/21/17.     Baseline Pt able to return demo, but is not performing HEP at home.  02/23/17   Time 4   Period Weeks   Status Partially Met     PT SHORT TERM GOAL #2   Title Pt will improve TUG score to less than or equal to 13 seconds for decreased fall risk.   Baseline 12.47 best of 3 trials 02/23/17   Time 4   Period Weeks   Status Partially Met     PT SHORT TERM GOAL #3   Title Pt wil perform at least 8 of 10 reps of sit<>stand transfers from 18 inch surfaces or below, without UE support, independently for improved transfer efficiency and safety.   Time 4   Period Weeks   Status Achieved     PT SHORT TERM GOAL #4   Title Pt will verbalize undersatnding of techniques to reduce freezing episodes with gait and turns   Status Achieved     PT SHORT TERM GOAL #5   Title Pt will improve FGA to at least 16/30 for decreased fall risk.   Baseline 17/30 on 02/23/17   Time 4   Period Weeks   Status Achieved         Short Term Clinic Goals - 02/27/17 1807      CC Short Term Goal  #1   Title pt will have reducton of right leg at 10 cm proximal to lateral foot to 36 cm    Baseline 38 cm    Time 3   Period Weeks   Status New     CC Short Term Goal  #2   Title pt will have reduction of left leg at 10 cm proximal to lateral foot to 33 cm    Baseline 34 cm    Time 3   Period Weeks   Status New          PT Long Term Goals - 03/09/17 1112      PT LONG TERM GOAL #1   Title Pt will verbalize understanding of fall prevention within the home environment.  TARGET updated 03/24/17   Time 8   Period Weeks   Status Achieved     PT LONG TERM GOAL #2   Title Pt will improve TUG manual to less than or equal to 18 seconds for decreased fall risk/improved dual tasking with gait.   Time 8   Period Weeks   Status Achieved     PT LONG TERM GOAL #3   Title Pt will improve gait velocity with conversation to at least 3 ft/sec for improved gait efficiency and safety.   Time 8   Period Weeks   Status Not  Met     PT LONG TERM GOAL #4   Title Pt will  improve Functional Gait Assessment score to at least 19/30 for decreased fall risk.   Time 8   Period Weeks   Status Not Met     PT LONG TERM GOAL #5   Title Pt will verbalize plans for optimal continued community fitness upon D/C from PT.   Time 8   Period Weeks   Status Achieved           Long Term Clinic Goals - 02/27/17 1816      CC Long Term Goal  #1   Title Pt will have strategy for long term managment of lymphedema including intermittent compression pump and compression stockings.    Time 6   Period Weeks   Status New     CC Long Term Goal  #2   Title pt will have reduction of right leg at 10 cm proximal to lateral foot to 34 cm    Baseline 38 cm    Period Weeks     CC Long Term Goal  #3   Title pt will have reduction of left leg at 10 cm proximal to lateral foot to 32 cm    Baseline 34   Time 6   Period Weeks   Status New            Plan - 03/09/17 2352    Clinical Impression Statement Assessed LTGs this visit, as pt feels he is utilizing strategies at home for reduction of freezing.  He has met LTG 1, 2, 5.  LTG 3 and 4 not met.  Pt notes lightheadedness today during session; assessed vital signs and he has no other complaints.  He notes the only new thing is using the new velcro lymphedema wraps overnight last night.  (He has removed them and will follow up with lymphedema PT). Pt fatigues after 3 minute of gait, with instruction in walking program for home.  Pt is appropriate for d/c from neuro PT at this time; to continue PT for lymphedema management.   Rehab Potential Good   PT Frequency 2x / week   PT Duration 8 weeks  plus eval   PT Treatment/Interventions ADLs/Self Care Home Management;Functional mobility training;Gait training;DME Instruction;Patient/family education;Neuromuscular re-education;Balance training;Therapeutic exercise;Therapeutic activities   PT Next Visit Plan D/C neuro PT this visit; plan  for return screen in 6 months.   Consulted and Agree with Plan of Care Patient      Patient will benefit from skilled therapeutic intervention in order to improve the following deficits and impairments:  Abnormal gait, Decreased balance, Decreased mobility, Decreased strength, Difficulty walking, Postural dysfunction  Visit Diagnosis: Other abnormalities of gait and mobility  Unsteadiness on feet  Other symptoms and signs involving the nervous system     Problem List Patient Active Problem List   Diagnosis Date Noted  . Lymphedema 02/10/2017  . BPH associated with nocturia 05/16/2016  . Senile purpura ([place]) 05/03/2016  . OSA (obstructive sleep apnea) 12/28/2015  . Hypersomnia 11/15/2015  . Cough 11/15/2015  . 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 Dizziness 12/30/2014  . Diastolic dysfunction 52/84/1324  . Former smoker 04/29/2014  . Chronic venous insufficiency 02/20/2014  . Hyperglycemia 08/02/2012  . Obesity 07/13/2010  . History of malignant melanoma. left leg 03/09/2009  . Insomnia 10/03/2007  . Hyperlipidemia 12/13/2006  . Essential hypertension 12/13/2006    Earlyne Feeser W. 03/09/2017, 11:56 PM  [name]., PT   [facility] [address] [place], [place], [address] Phone: [phone]  Fax:  [phone]  Name: Nicholas [name] MRN: [id number] Date of Birth: 1946-04-06  PHYSICAL THERAPY DISCHARGE SUMMARY  Visits from Start of Care: 12 (10 visits for Neuro PT, 2 visits so far to address lymphedema)  Current functional level related to goals / functional outcomes: See goals checked above-pt has met 3 of 5 long term goals.   Remaining deficits: Balance, freezing episodes with gait   Education / Equipment: Educated in [employer], fall prevention, ongoing fitness upon D/C from PT.  Plan: Patient agrees to discharge.  Patient goals were partially met. Patient is being discharged due to  being pleased with the current functional level.  ?????   [name], PT 05/03/17 2:32 PM Phone: [phone] Fax: [phone]

## 2017-03-12 ENCOUNTER — Ambulatory Visit: Payer: PPO | Admitting: Physical Therapy

## 2017-03-12 ENCOUNTER — Encounter: Payer: Self-pay | Admitting: Physical Therapy

## 2017-03-12 DIAGNOSIS — R2689 Other abnormalities of gait and mobility: Secondary | ICD-10-CM | POA: Diagnosis not present

## 2017-03-12 DIAGNOSIS — I890 Lymphedema, not elsewhere classified: Secondary | ICD-10-CM

## 2017-03-12 NOTE — Therapy (Signed)
[place], [place], [address] Phone: [phone]   Fax:  [phone]  Physical Therapy Treatment  Patient Details  Name: Nicholas [name] MRN: [id number] Date of Birth: January 18, 1946 Referring Provider: Dr. [name]   Encounter Date: 03/12/2017      PT End of Session - 03/12/17 1650    Visit Number 13  3 for lymphedema   Number of Visits 18   Date for PT Re-Evaluation 03/24/17  for lymphedema 04/10/17   Authorization Type [employer] every 10th visit   Authorization Time Period 01/23/17  to 03/24/17   PT Start Time 1605   PT Stop Time 1648   PT Time Calculation (min) 43 min   Activity Tolerance Patient tolerated treatment well   Behavior During Therapy [facility] for tasks assessed/performed      Past Medical History:  Diagnosis Date  . Cancer [facility]) Jan 2008   skin; hx of melanoma left foot/ amputation of toes 1&2   . Hyperlipidemia   . Hypertension   . Lymphedema     Past Surgical History:  Procedure Laterality Date  . 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 removal of melanoma of left foot/amputation of toes 1&25 Jul 2006  . WRIST FRACTURE SURGERY     71 years old-set    There were no vitals filed for this visit.      Subjective Assessment - 03/12/17 1608    Subjective I got the garments last Friday. I left them on all night and took them off on Saturday and I have not had them back on since because I could not get them back on.    Pertinent History lymphedema bilateral lower extremities, malignant melanoma lower leg s/p toe amputation L foot; obesity, CHF, OSA   Patient Stated Goals Pt's goal for therapy is to manage Parkinson's disease symptoms.   Currently in Pain? No/denies   Pain Score 0-No pain                         [facility] Adult PT Treatment/Exercise - 03/12/17 0001      Manual Therapy   Manual Therapy Edema management;Manual Lymphatic Drainage (MLD)    Edema Management pt received bilateral reduction kits and was educated in proper donning/doffing, pt attempted but states he is unable to reach that far and will have his wife do it for him, ordered foot pieces which pt will pick up on Wednesday prior to next session   Manual Lymphatic Drainage (MLD) Briefly In Supine: Short neck, 5 diaphragmatic breaths, Rt axilla nodes and Rt inguino-axillary anastomosis, and Rt LE from dorsal foot to lateral hip working from proximal to distal then retracing all steps repeated on L                  PT Short Term Goals - 02/23/17 1111      PT SHORT TERM GOAL #1   Title Pt will be independent with HEP for improved balance, transfers and gait.  TARGET updated to 02/21/17.    Baseline Pt able to return demo, but is not performing HEP at home.  02/23/17   Time 4   Period Weeks   Status Partially Met     PT SHORT TERM GOAL #2   Title Pt will improve TUG score to less than or equal to 13 seconds for decreased fall risk.   Baseline 12.47 best of 3 trials 02/23/17  Time 4   Period Weeks   Status Partially Met     PT SHORT TERM GOAL #3   Title Pt wil perform at least 8 of 10 reps of sit<>stand transfers from 18 inch surfaces or below, without UE support, independently for improved transfer efficiency and safety.   Time 4   Period Weeks   Status Achieved     PT SHORT TERM GOAL #4   Title Pt will verbalize undersatnding of techniques to reduce freezing episodes with gait and turns   Status Achieved     PT SHORT TERM GOAL #5   Title Pt will improve FGA to at least 16/30 for decreased fall risk.   Baseline 17/30 on 02/23/17   Time 4   Period Weeks   Status Achieved         Short Term Clinic Goals - 02/27/17 1807      CC Short Term Goal  #1   Title pt will have reducton of right leg at 10 cm proximal to lateral foot to 36 cm    Baseline 38 cm    Time 3   Period Weeks   Status New     CC Short Term Goal  #2   Title pt will have reduction of  left leg at 10 cm proximal to lateral foot to 33 cm    Baseline 34 cm    Time 3   Period Weeks   Status New          PT Long Term Goals - 03/09/17 1112      PT LONG TERM GOAL #1   Title Pt will verbalize understanding of fall prevention within the home environment.  TARGET updated 03/24/17   Time 8   Period Weeks   Status Achieved     PT LONG TERM GOAL #2   Title Pt will improve TUG manual to less than or equal to 18 seconds for decreased fall risk/improved dual tasking with gait.   Time 8   Period Weeks   Status Achieved     PT LONG TERM GOAL #3   Title Pt will improve gait velocity with conversation to at least 3 ft/sec for improved gait efficiency and safety.   Time 8   Period Weeks   Status Not Met     PT LONG TERM GOAL #4   Title Pt will improve Functional Gait Assessment score to at least 19/30 for decreased fall risk.   Time 8   Period Weeks   Status Not Met     PT LONG TERM GOAL #5   Title Pt will verbalize plans for optimal continued community fitness upon D/C from PT.   Time 8   Period Weeks   Status Achieved           Long Term Clinic Goals - 02/27/17 1816      CC Long Term Goal  #1   Title Pt will have strategy for long term managment of lymphedema including intermittent compression pump and compression stockings.    Time 6   Period Weeks   Status New     CC Long Term Goal  #2   Title pt will have reduction of right leg at 10 cm proximal to lateral foot to 34 cm    Baseline 38 cm    Period Weeks     CC Long Term Goal  #3   Title pt will have reduction of left leg at 10 cm proximal to lateral foot  to 32 cm    Baseline 34   Time 6   Period Weeks   Status New            Plan - 03/12/17 1651    Clinical Impression Statement Pt received his bilateral velcro reduction kits today. He did not obtain foot pieces. Called [employer] and ordered foot pieces for pt. He will pick them up on Wednesday prior to his appointment here. Attempted  educating pt on how to don/doff velcro compression garments independently but pt states he is unable to reach down that far. He was able to reach down with encouragement but was unable to get bands straight when pulling them. He states his wife will don/doff them and he will bring her to next session to be instructed in proper donning/doffing technique.    Rehab Potential Good   Clinical Impairments Affecting Rehab Potential previous left inguinal node dissection., longstanding lymphedema    PT Frequency 2x / week   PT Duration 6 weeks   PT Treatment/Interventions ADLs/Self Care Home Management;Patient/family education;Orthotic Fit/Training;Manual lymph drainage;Compression bandaging   PT Next Visit Plan continue MLD to bilateral LE, instruct pt's wife in donning/doffing velcro garments and foot pieces   Consulted and Agree with Plan of Care Patient      Patient will benefit from skilled therapeutic intervention in order to improve the following deficits and impairments:  Abnormal gait, Decreased balance, Decreased mobility, Decreased strength, Difficulty walking, Postural dysfunction, Decreased knowledge of precautions, Increased edema  Visit Diagnosis: Lymphedema, not elsewhere classified     Problem List Patient Active Problem List   Diagnosis Date Noted  . Lymphedema 02/10/2017  . BPH associated with nocturia 05/16/2016  . Senile purpura ([place]) 05/03/2016  . OSA (obstructive sleep apnea) 12/28/2015  . Hypersomnia 11/15/2015  . Cough 11/15/2015  . 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 Dizziness 12/30/2014  . Diastolic dysfunction 22/84/0698  . Former smoker 04/29/2014  . Chronic venous insufficiency 02/20/2014  . Hyperglycemia 08/02/2012  . Obesity 07/13/2010  . History of malignant melanoma. left leg 03/09/2009  . Insomnia 10/03/2007  . Hyperlipidemia 12/13/2006  . Essential hypertension 12/13/2006    Allyson Sabal [facility] 03/12/2017, 5:04 PM  [place], [place], [address] Phone: [phone]   Fax:  [phone]  Name: Nicholas [name] MRN: [id number] Date of Birth: [birth year]  Manus Gunning, PT 03/12/17 5:04 PM

## 2017-03-13 ENCOUNTER — Encounter: Payer: PPO | Admitting: Occupational Therapy

## 2017-03-13 ENCOUNTER — Encounter: Payer: PPO | Admitting: Speech Pathology

## 2017-03-13 ENCOUNTER — Ambulatory Visit: Payer: PPO | Admitting: Physical Therapy

## 2017-03-14 ENCOUNTER — Encounter: Payer: Self-pay | Admitting: Physical Therapy

## 2017-03-14 ENCOUNTER — Ambulatory Visit: Payer: PPO | Admitting: Physical Therapy

## 2017-03-14 DIAGNOSIS — R2689 Other abnormalities of gait and mobility: Secondary | ICD-10-CM | POA: Diagnosis not present

## 2017-03-14 DIAGNOSIS — I890 Lymphedema, not elsewhere classified: Secondary | ICD-10-CM

## 2017-03-14 NOTE — Therapy (Signed)
[place], [place], [address] Phone: [phone]   Fax:  [phone]  Physical Therapy Treatment  Patient Details  Name: Nicholas [name] MRN: [id number] Date of Birth: 10-14-1945 Referring Provider: Dr. [name]   Encounter Date: 03/14/2017      PT End of Session - 03/14/17 1650    Visit Number 14  4 for lymph   Number of Visits 18   Date for PT Re-Evaluation 04/10/17   Authorization Type [employer] every 10th visit   Authorization Time Period 01/23/17  to 03/24/17   PT Start Time 1603   PT Stop Time 1646   PT Time Calculation (min) 43 min   Activity Tolerance Patient tolerated treatment well   Behavior During Therapy [name] for tasks assessed/performed      Past Medical History:  Diagnosis Date  . Cancer [facility]) Jan 2008   skin; hx of melanoma left foot/ amputation of toes 1&2   . Hyperlipidemia   . Hypertension   . Lymphedema     Past Surgical History:  Procedure Laterality Date  . 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 removal of melanoma of left foot/amputation of toes 1&25 Jul 2006  . WRIST FRACTURE SURGERY     71 years old-set    There were no vitals filed for this visit.      Subjective Assessment - 03/14/17 1606    Subjective I left the garments on all night after you put them on last. My swelling was down when I took them off.    Pertinent History lymphedema bilateral lower extremities, malignant melanoma lower leg s/p toe amputation L foot; obesity, CHF, OSA   Patient Stated Goals Pt's goal for therapy is to manage Parkinson's disease symptoms.   Currently in Pain? No/denies   Pain Score 0-No pain                         [facility] Adult PT Treatment/Exercise - 03/14/17 0001      Manual Therapy   Manual Therapy Edema management;Manual Lymphatic Drainage (MLD)   Manual Lymphatic Drainage (MLD) In Supine: Short neck, 5 diaphragmatic breaths, Rt axilla  nodes and Rt inguino-axillary anastomosis, and Rt LE from dorsal foot to lateral hip working from proximal to distal then retracing all steps repeated on L                  PT Short Term Goals - 02/23/17 1111      PT SHORT TERM GOAL #1   Title Pt will be independent with HEP for improved balance, transfers and gait.  TARGET updated to 02/21/17.    Baseline Pt able to return demo, but is not performing HEP at home.  02/23/17   Time 4   Period Weeks   Status Partially Met     PT SHORT TERM GOAL #2   Title Pt will improve TUG score to less than or equal to 13 seconds for decreased fall risk.   Baseline 12.47 best of 3 trials 02/23/17   Time 4   Period Weeks   Status Partially Met     PT SHORT TERM GOAL #3   Title Pt wil perform at least 8 of 10 reps of sit<>stand transfers from 18 inch surfaces or below, without UE support, independently for improved transfer efficiency and safety.   Time 4   Period Weeks   Status Achieved  PT SHORT TERM GOAL #4   Title Pt will verbalize undersatnding of techniques to reduce freezing episodes with gait and turns   Status Achieved     PT SHORT TERM GOAL #5   Title Pt will improve FGA to at least 16/30 for decreased fall risk.   Baseline 17/30 on 02/23/17   Time 4   Period Weeks   Status Achieved         Short Term Clinic Goals - 02/27/17 1807      CC Short Term Goal  #1   Title pt will have reducton of right leg at 10 cm proximal to lateral foot to 36 cm    Baseline 38 cm    Time 3   Period Weeks   Status New     CC Short Term Goal  #2   Title pt will have reduction of left leg at 10 cm proximal to lateral foot to 33 cm    Baseline 34 cm    Time 3   Period Weeks   Status New          PT Long Term Goals - 03/09/17 1112      PT LONG TERM GOAL #1   Title Pt will verbalize understanding of fall prevention within the home environment.  TARGET updated 03/24/17   Time 8   Period Weeks   Status Achieved     PT LONG TERM GOAL  #2   Title Pt will improve TUG manual to less than or equal to 18 seconds for decreased fall risk/improved dual tasking with gait.   Time 8   Period Weeks   Status Achieved     PT LONG TERM GOAL #3   Title Pt will improve gait velocity with conversation to at least 3 ft/sec for improved gait efficiency and safety.   Time 8   Period Weeks   Status Not Met     PT LONG TERM GOAL #4   Title Pt will improve Functional Gait Assessment score to at least 19/30 for decreased fall risk.   Time 8   Period Weeks   Status Not Met     PT LONG TERM GOAL #5   Title Pt will verbalize plans for optimal continued community fitness upon D/C from PT.   Time 8   Period Weeks   Status Achieved           Long Term Clinic Goals - 02/27/17 1816      CC Long Term Goal  #1   Title Pt will have strategy for long term managment of lymphedema including intermittent compression pump and compression stockings.    Time 6   Period Weeks   Status New     CC Long Term Goal  #2   Title pt will have reduction of right leg at 10 cm proximal to lateral foot to 34 cm    Baseline 38 cm    Period Weeks     CC Long Term Goal  #3   Title pt will have reduction of left leg at 10 cm proximal to lateral foot to 32 cm    Baseline 34   Time 6   Period Weeks   Status New            Plan - 03/14/17 1651    Clinical Impression Statement Pt states his velcro compression foot pieces did not arrive so he did not bring his wife to learn how to don/doff garments. He did not  bring his circaids to don following appointment today. MLD only performed and pt educated on importance of donning garments after MLD. Gave pt business card to contact a compression pump rep with questions about pump he received from a friend.    Rehab Potential Good   Clinical Impairments Affecting Rehab Potential previous left inguinal node dissection., longstanding lymphedema    PT Frequency 2x / week   PT Duration 6 weeks   PT  Treatment/Interventions ADLs/Self Care Home Management;Patient/family education;Orthotic Fit/Training;Manual lymph drainage;Compression bandaging   PT Next Visit Plan continue MLD to bilateral LE, instruct pt's wife in donning/doffing velcro garments and foot pieces   Consulted and Agree with Plan of Care Patient      Patient will benefit from skilled therapeutic intervention in order to improve the following deficits and impairments:  Abnormal gait, Decreased balance, Decreased mobility, Decreased strength, Difficulty walking, Postural dysfunction, Decreased knowledge of precautions, Increased edema  Visit Diagnosis: Lymphedema, not elsewhere classified     Problem List Patient Active Problem List   Diagnosis Date Noted  . Lymphedema 02/10/2017  . BPH associated with nocturia 05/16/2016  . Senile purpura ([place]) 05/03/2016  . OSA (obstructive sleep apnea) 12/28/2015  . Hypersomnia 11/15/2015  . Cough 11/15/2015  . 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 Dizziness 12/30/2014  . Diastolic dysfunction 32/06/2481  . Former smoker 04/29/2014  . Chronic venous insufficiency 02/20/2014  . Hyperglycemia 08/02/2012  . Obesity 07/13/2010  . History of malignant melanoma. left leg 03/09/2009  . Insomnia 10/03/2007  . Hyperlipidemia 12/13/2006  . Essential hypertension 12/13/2006    Allyson Sabal [facility] 03/14/2017, 4:54 PM  [name], [place], [address] Phone: [phone]   Fax:  [phone]  Name: RAESHAUN SIMSON MRN: [id number] Date of Birth: [birth year]  Manus Gunning, PT 03/14/17 4:55 PM

## 2017-03-16 ENCOUNTER — Ambulatory Visit: Payer: PPO | Admitting: Occupational Therapy

## 2017-03-16 ENCOUNTER — Ambulatory Visit: Payer: PPO

## 2017-03-16 ENCOUNTER — Ambulatory Visit: Payer: PPO | Admitting: Physical Therapy

## 2017-03-16 DIAGNOSIS — R41841 Cognitive communication deficit: Secondary | ICD-10-CM

## 2017-03-16 DIAGNOSIS — R2681 Unsteadiness on feet: Secondary | ICD-10-CM

## 2017-03-16 DIAGNOSIS — R29898 Other symptoms and signs involving the musculoskeletal system: Secondary | ICD-10-CM

## 2017-03-16 DIAGNOSIS — R29818 Other symptoms and signs involving the nervous system: Secondary | ICD-10-CM

## 2017-03-16 DIAGNOSIS — R278 Other lack of coordination: Secondary | ICD-10-CM

## 2017-03-16 DIAGNOSIS — R471 Dysarthria and anarthria: Secondary | ICD-10-CM

## 2017-03-16 DIAGNOSIS — R2689 Other abnormalities of gait and mobility: Secondary | ICD-10-CM | POA: Diagnosis not present

## 2017-03-16 NOTE — Patient Instructions (Signed)
Continue to do your loud "ah" at least x5 reps, x2/day.  Continue to practice your louder speech with the worksheets.

## 2017-03-16 NOTE — Therapy (Addendum)
[place] [address] [place], [place], [address] Phone: [phone]   Fax:  [phone]  Occupational Therapy Treatment  Patient Details  Name: Nicholas [name] MRN: [id number] Date of Birth: 1945-08-30 Referring Provider: Dr. Carles Collet  Encounter Date: 03/16/2017      OT End of Session - 03/16/17 1406    Visit Number 11   Number of Visits 17   Date for OT Re-Evaluation 03/30/17   Authorization Type [employer], G code needed   Authorization Time Period 02/23/17-04/23/17   Authorization - Visit Number 10   Authorization - Number of Visits 11   OT Start Time 1400   OT Stop Time 1423  d/c visit,   OT Time Calculation (min) 23 min   Activity Tolerance Patient tolerated treatment well   Behavior During Therapy [facility] for tasks assessed/performed      Past Medical History:  Diagnosis Date  . Cancer [facility]) Jan 2008   skin; hx of melanoma left foot/ amputation of toes 1&2   . Hyperlipidemia   . Hypertension   . Lymphedema     Past Surgical History:  Procedure Laterality Date  . 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 removal of melanoma of left foot/amputation of toes 1&25 Jul 2006  . WRIST FRACTURE SURGERY     71 years old-set    There were no vitals filed for this visit.        Pt denies pain and agrees with d/c. Reveiwed progress and checked remaining goals. Discussed screen in 6 months,and importance of continued exercise,  pt in agreement. Fine motor coordination tasks flipping, and  dealing cards with big movements, min v.c                     OT Short Term Goals - 03/16/17 1407      OT SHORT TERM GOAL #1   Title I with updated PD specific HEP- due 02/09/17   Time 4   Period Weeks   Status Achieved     OT SHORT TERM GOAL #2   Title Pt will verbalize understanding of adapted strategies for ADLs/IADLS.   Time 5   Period Weeks   Status Achieved     OT SHORT TERM GOAL #3   Title Pt  will demonstrate improved standing balance for ADLs as evidenced by improving standing functional reach to 10 inches or greater for LUE.   Period Weeks   Status Achieved     OT SHORT TERM GOAL #4   Title Pt will demonstrate improved bilateral UE functional use for ADLs as evidenced by  increasing box/ blocks by 3 blocks.   Time 4   Period Weeks   Status Achieved           OT Long Term Goals - 03/16/17 1408      OT LONG TERM GOAL #1   Title Pt will verbalize understanding of ways to prevent future PD related complications and verbalize understanding of community resources. due 03/30/17   Time 5   Period Weeks   Status Achieved     OT LONG TERM GOAL #2   Title Pt will demonstrate improved LUE fine motor coordination for ADLS as evidenced by decreasing LUE 9 hole peg test  to 29 secs or less.   Time 5   Period Weeks   Status Achieved  26.34     OT LONG TERM GOAL #3   Title -----------------------------------------------------------------------------------------------------------------------------------------  OT LONG TERM GOAL #4   Title ---------------------------------------------------------------------------------------               Plan - 04-11-17 1745    Clinical Impression Statement Pt agrees with plans for d/c and return screen in 6-8 months.   Rehab Potential Good   Current Impairments/barriers affecting progress: freezing episodes   OT Frequency 2x / week   OT Duration 8 weeks   OT Treatment/Interventions Self-care/ADL training;DME and/or AE instruction;Patient/family education;Balance training;Therapeutic exercises;Therapeutic activities;Therapeutic exercise;Ultrasound;Neuromuscular education;Passive range of motion;Functional Mobility Training;Cognitive remediation/compensation;Visual/perceptual remediation/compensation;Manual Therapy;Energy conservation   Plan Discharge from occupational therapy   Consulted and Agree with Plan of Care Patient       Patient will benefit from skilled therapeutic intervention in order to improve the following deficits and impairments:  Abnormal gait, Decreased cognition, Decreased knowledge of use of DME, Increased edema, Impaired flexibility, Decreased mobility, Decreased coordination, Decreased activity tolerance, Decreased endurance, Decreased strength, Impaired tone, Impaired UE functional use, Decreased safety awareness, Decreased knowledge of precautions, Decreased balance  Visit Diagnosis: Other symptoms and signs involving the nervous system  Other symptoms and signs involving the musculoskeletal system  Other lack of coordination  Unsteadiness on feet      G-Codes - Apr 11, 2017 1748    Functional Assessment Tool Used (Outpatient only) standing functional reach 11.5 for LUE  Box/ blocks RUE  54 blocks     LUE 50 blocks, LUE 9 hole peg test 26.34 secs   Functional Limitation Self care   Self Care Goal Status ([id number]) At least 1 percent but less than 20 percent impaired, limited or restricted   Self Care Discharge Status [phone]) At least 1 percent but less than 20 percent impaired, limited or restricted    OCCUPATIONAL THERAPY DISCHARGE SUMMARY    Current functional level related to goals / functional outcomes: Pt demonstrates good overall progress towards goals.    Remaining deficits:  bradykinesia, freezing episodes, decreased coordination, rigidity, decreased balance,    Education / Equipment: Pt was educated in updated HEP, ways to prevent future complications and community resources..Pt verbalizes understanding of all education. Plan: Patient agrees to discharge.  Patient goals were partially met. Patient is being discharged due to meeting the stated rehab goals.  ?????      Problem List Patient Active Problem List   Diagnosis Date Noted  . Lymphedema 02/10/2017  . BPH associated with nocturia 05/16/2016  . Senile purpura ([place]) 05/03/2016  . OSA (obstructive sleep apnea)  12/28/2015  . Hypersomnia 11/15/2015  . Cough 11/15/2015  . 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 Dizziness 12/30/2014  . Diastolic dysfunction 15/94/7076  . Former smoker 04/29/2014  . Chronic venous insufficiency 02/20/2014  . Hyperglycemia 08/02/2012  . Obesity 07/13/2010  . History of malignant melanoma. left leg 03/09/2009  . Insomnia 10/03/2007  . Hyperlipidemia 12/13/2006  . Essential hypertension 12/13/2006    RINE,KATHRYN 2017-04-11, 5:50 PM Theone Murdoch, OTR/L [phone] Phone: [phone] 10:45 AM 03/22/17 [place] [address] [place], [place], [address] Phone: [phone]   Fax:  [phone]  Name: Nicholas [name] MRN: [id number] Date of Birth: [birth year]

## 2017-03-16 NOTE — Therapy (Signed)
[facility] [address] [place], [place], [address] Phone: [phone]   Fax:  [phone]  Speech Language Pathology Treatment  Patient Details  Name: Nicholas [name] MRN: [id number] Date of Birth: 10-20-1945 Referring Provider: Alonza Bogus, DO  Encounter Date: 03/16/2017      End of Session - 03/16/17 1611    Visit Number 11   Number of Visits 17   Date for SLP Re-Evaluation 03/30/17      Past Medical History:  Diagnosis Date  . Cancer [facility]) Jan 2008   skin; hx of melanoma left foot/ amputation of toes 1&2   . Hyperlipidemia   . Hypertension   . Lymphedema     Past Surgical History:  Procedure Laterality Date  . 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 removal of melanoma of left foot/amputation of toes 1&25 Jul 2006  . WRIST FRACTURE SURGERY     71 years old-set    There were no vitals filed for this visit.      Subjective Assessment - 03/16/17 1459    Subjective "We can end it today. Marland KitchenMarland KitchenMarland KitchenWhen I feel the weakness (in my voice) now I just shut up."   Currently in Pain? No/denies               ADULT SLP TREATMENT - 03/16/17 1500      General Information   Behavior/Cognition Alert;Cooperative;Pleasant mood     Treatment Provided   Treatment provided Cognitive-Linquistic     Cognitive-Linquistic Treatment   Treatment focused on Dysarthria   Skilled Treatment Pt with loud /a/ work routinely, and did some daily homework tasks. SLP again stressed daily practice with homework. Loud /a/ average to recalibrate conversational loudness was mid 90s dB. Multi sentnece reponses were measured with average upper 60s -lower 70s dB, and req'd min-mod A occasionally for loudness, as did simple conversation. SLP told pt that daily practice will keep this louder volume more habitual in that pt will have had practice at this level. Pt told SLP he knows what he has to do he just has to do it.      Assessment /  Recommendations / Plan   Plan Discharge SLP treatment due to (comment)  pt request     Progression Toward Goals   Progression toward goals --  d/c date            SLP Short Term Goals - 02/23/17 1443      SLP SHORT TERM GOAL #1   Title pt will sustain loud /a/ at 94dB over 4 consecutive sessions   Period Weeks   Status Not Met     SLP SHORT TERM GOAL #2   Title pt will exhibit volume of 70dB in sentence responses 95%   Status Not Met     SLP SHORT TERM GOAL #3   Title In 7 minutes simple conversation pt will achieve average speech volume of low 70s dB over two sessions   Status Not Met          SLP Long Term Goals - 03/16/17 1613      SLP LONG TERM GOAL #1   Title pt will sustain loud /a/ at average 92dB over 3 consecutive sessions with SBA   Status Achieved     SLP LONG TERM GOAL #2   Title pt will generate speech in 10 minutes mod complex conversation with average 70dB over three sessions   Status Not Met  Plan - 2017/03/30 1612    Clinical Impression Statement Pt reportedly performed loud /a/ and daily speech tasks. Maintained WNL in multisentence response tasks with min-mod A occasionally for loudness, in simple conversation pt also req'd min-mod A occasionally for loudness. Pt requested d/c today and said he knows what he has to do he just has to do it. Discharge skilled ST today with screen in approx 6-8 months.   Speech Therapy Frequency 2x / week   Treatment/Interventions Compensatory techniques;Internal/external aids;SLP instruction and feedback;Functional tasks;Patient/family education;Cueing hierarchy;Environmental controls;Cognitive reorganization   Potential to Achieve Goals Good   SLP Home Exercise Plan loud /a/   Consulted and Agree with Plan of Care Patient      Patient will benefit from skilled therapeutic intervention in order to improve the following deficits and impairments:   Dysarthria and anarthria  Cognitive communication  deficit      G-Codes - 03/30/17 1614    Functional Assessment Tool Used noms    Functional Limitations Motor speech   Motor Speech Goal Status [phone]) At least 20 percent but less than 40 percent impaired, limited or restricted   Motor Speech Goal Status ([id number]) At least 20 percent but less than 40 percent impaired, limited or restricted     SPEECH THERAPY DISCHARGE SUMMARY  Visits from Start of Care: 11  Current functional level related to goals / functional outcomes: Pt made some gains in loud /a/ and functionally in sentence tasks but SLP feels there was more effort that could have been put forth by pt for more lasting change at multisentence and conversational levels. Pt appeared satisfied with change in loudness as he told SLP when he feels he is too soft "I just shut up". Pt indicated that this was acceptable for him when asked by SLP directly, he responded that he knows what he needs to do in order to speak at [facility] loudness in conversation, he just needs to do it more often.    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   Education / Equipment: Loud /a/ needs to be daily, need to do daily homework.   Plan: Patient agrees to discharge.  Patient goals were not met. Patient is being discharged due to the patient's request.  ?????        Problem List Patient Active Problem List   Diagnosis Date Noted  . Lymphedema 02/10/2017  . BPH associated with nocturia 05/16/2016  . Senile purpura ([place]) 05/03/2016  . OSA (obstructive sleep apnea) 12/28/2015  . Hypersomnia 11/15/2015  . Cough 11/15/2015  . 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 Dizziness 12/30/2014  . Diastolic dysfunction 03/50/0938  . Former smoker 04/29/2014  . Chronic venous insufficiency 02/20/2014  . Hyperglycemia 08/02/2012  . Obesity 07/13/2010  . History of malignant melanoma. left leg 03/09/2009  . Insomnia 10/03/2007  . Hyperlipidemia 12/13/2006  . Essential hypertension  12/13/2006    [facility] ,[place], [place]  2017/03/30, 4:23 PM  [facility] [address] [place], [place], [address] Phone: [phone]   Fax:  [phone]   Name: Nicholas [name] MRN: [id number] Date of Birth: 1946-04-24

## 2017-03-20 ENCOUNTER — Encounter: Payer: PPO | Admitting: Occupational Therapy

## 2017-03-20 ENCOUNTER — Ambulatory Visit: Payer: PPO | Admitting: Physical Therapy

## 2017-03-20 ENCOUNTER — Ambulatory Visit: Payer: PPO | Admitting: Speech Pathology

## 2017-03-21 ENCOUNTER — Encounter: Payer: PPO | Admitting: Occupational Therapy

## 2017-03-21 ENCOUNTER — Ambulatory Visit: Payer: PPO

## 2017-03-21 DIAGNOSIS — I890 Lymphedema, not elsewhere classified: Secondary | ICD-10-CM

## 2017-03-21 DIAGNOSIS — R2689 Other abnormalities of gait and mobility: Secondary | ICD-10-CM | POA: Diagnosis not present

## 2017-03-21 NOTE — Therapy (Signed)
[place], [place], [address] Phone: [phone]   Fax:  [phone]  Physical Therapy Treatment  Patient Details  Name: Nicholas [name] MRN: [id number] Date of Birth: [birth year] Referring Provider: Dr. [name]   Encounter Date: 03/21/2017      PT End of Session - 03/21/17 1704    Visit Number 15  5 for lymph    Number of Visits 18   Date for PT Re-Evaluation 04/10/17   Authorization Type [employer] every 10th visit   Authorization Time Period 01/23/17  to 03/24/17   PT Start Time 1607   PT Stop Time 1648   PT Time Calculation (min) 41 min   Activity Tolerance Patient tolerated treatment well   Behavior During Therapy [facility] for tasks assessed/performed      Past Medical History:  Diagnosis Date  . Cancer [facility]) Jan 2008   skin; hx of melanoma left foot/ amputation of toes 1&2   . Hyperlipidemia   . Hypertension   . Lymphedema     Past Surgical History:  Procedure Laterality Date  . 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 removal of melanoma of left foot/amputation of toes 1&25 Jul 2006  . WRIST FRACTURE SURGERY     71 years old-set    There were no vitals filed for this visit.      Subjective Assessment - 03/21/17 1610    Subjective I've been wearing the velcro compression garments a few times since I was here. I didn't wear them yesterday because I was really dizzy which happens to me every now and then. I got the foot pieces and brought them in today.    Pertinent History lymphedema bilateral lower extremities, malignant melanoma lower leg s/p toe amputation L foot; obesity, CHF, OSA   Patient Stated Goals Pt's goal for therapy is to manage Parkinson's disease symptoms.   Currently in Pain? No/denies               LYMPHEDEMA/ONCOLOGY QUESTIONNAIRE - 03/21/17 1621      Right Lower Extremity Lymphedema   At Midpatella/Popliteal Crease 42 cm   30 cm Proximal to  Floor at Lateral Plantar Foot 48.6 cm   20 cm Proximal to Floor at Lateral Plantar Foot 42._0 cm Proximal to Floor at Lateral Malleoli 37.1 cm   5 cm Proximal to 1st MTP Joint 28 cm   Across MTP Joint 25.7 cm   Around Proximal Great Toe 8.8 cm     Left Lower Extremity Lymphedema   At Midpatella/Popliteal Crease 38.2 cm   30 cm Proximal to Floor at Lateral Plantar Foot 47.4 cm   20 cm Proximal to Floor at Lateral Plantar Foot 42.7 cm   10 cm Proximal to Floor at Lateral Malleoli 34.3 cm   5 cm Proximal to 1st MTP Joint 28.7 cm   Across MTP Joint 26.8 cm   Around Proximal Great Toe 9.1 cm                  [facility] Adult PT Treatment/Exercise - 03/21/17 0001      Manual Therapy   Manual therapy comments Took circumference measurements of bil LE's   Edema Management Pt brought foot pieces so cut those to fit pts feet today. Pt also came in wearing his velcro compression that he reports he has been donning since he was here last (not daily but has worn them at least 3  times). So assessedhis donning technique. Rt LE wsa on good with even compression, but Lt leg garment was crooked, not even compression and bottom strapped wasn't attached so reviewed all this with pt.                   PT Short Term Goals - 02/23/17 1111      PT SHORT TERM GOAL #1   Title Pt will be independent with HEP for improved balance, transfers and gait.  TARGET updated to 02/21/17.    Baseline Pt able to return demo, but is not performing HEP at home.  02/23/17   Time 4   Period Weeks   Status Partially Met     PT SHORT TERM GOAL #2   Title Pt will improve TUG score to less than or equal to 13 seconds for decreased fall risk.   Baseline 12.47 best of 3 trials 02/23/17   Time 4   Period Weeks   Status Partially Met     PT SHORT TERM GOAL #3   Title Pt wil perform at least 8 of 10 reps of sit<>stand transfers from 18 inch surfaces or below, without UE support, independently for improved  transfer efficiency and safety.   Time 4   Period Weeks   Status Achieved     PT SHORT TERM GOAL #4   Title Pt will verbalize undersatnding of techniques to reduce freezing episodes with gait and turns   Status Achieved     PT SHORT TERM GOAL #5   Title Pt will improve FGA to at least 16/30 for decreased fall risk.   Baseline 17/30 on 02/23/17   Time 4   Period Weeks   Status Achieved         Short Term Clinic Goals - 02/27/17 1807      CC Short Term Goal  #1   Title pt will have reducton of right leg at 10 cm proximal to lateral foot to 36 cm    Baseline 38 cm    Time 3   Period Weeks   Status New     CC Short Term Goal  #2   Title pt will have reduction of left leg at 10 cm proximal to lateral foot to 33 cm    Baseline 34 cm    Time 3   Period Weeks   Status New          PT Long Term Goals - 03/09/17 1112      PT LONG TERM GOAL #1   Title Pt will verbalize understanding of fall prevention within the home environment.  TARGET updated 03/24/17   Time 8   Period Weeks   Status Achieved     PT LONG TERM GOAL #2   Title Pt will improve TUG manual to less than or equal to 18 seconds for decreased fall risk/improved dual tasking with gait.   Time 8   Period Weeks   Status Achieved     PT LONG TERM GOAL #3   Title Pt will improve gait velocity with conversation to at least 3 ft/sec for improved gait efficiency and safety.   Time 8   Period Weeks   Status Not Met     PT LONG TERM GOAL #4   Title Pt will improve Functional Gait Assessment score to at least 19/30 for decreased fall risk.   Time 8   Period Weeks   Status Not Met     PT LONG TERM  GOAL #5   Title Pt will verbalize plans for optimal continued community fitness upon D/C from PT.   Time 8   Period Weeks   Status Achieved           Long Term Clinic Goals - 02/27/17 1816      CC Long Term Goal  #1   Title Pt will have strategy for long term managment of lymphedema including intermittent  compression pump and compression stockings.    Time 6   Period Weeks   Status New     CC Long Term Goal  #2   Title pt will have reduction of right leg at 10 cm proximal to lateral foot to 34 cm    Baseline 38 cm    Period Weeks     CC Long Term Goal  #3   Title pt will have reduction of left leg at 10 cm proximal to lateral foot to 32 cm    Baseline 34   Time 6   Period Weeks   Status New            Plan - 03/21/17 1705    Clinical Impression Statement Pt came in wearing his velcro compression garments which he reports he had donned himself. The Rt leg was on properly but the Lt leg was uneven and last strap was unattached. Showed this to pt and demonstrated for him correct application using both hands to apply with always holding top strap tight before fixating one beneath it. He verbalized understanding this. His circumference measurements were reduced on Rt leg but increased osme on the Lt due to velcro garment not being properly in place or with consistent compression. Discussed with pt possibility of getting flat knit compression garments after he wears his reduction kits for a few weeks and pt reports for Lt leg it would be fine, he needs to think about what would be easiest for Rt leg with donning as he reports this is his weaker leg.    Rehab Potential Good   Clinical Impairments Affecting Rehab Potential previous left inguinal node dissection., longstanding lymphedema    PT Frequency 2x / week  dec to 1x/wk   PT Duration 6 weeks   PT Treatment/Interventions ADLs/Self Care Home Management;Patient/family education;Orthotic Fit/Training;Manual lymph drainage;Compression bandaging   PT Next Visit Plan Pt to come 1x/wk for next few weeks to assess his progress/measure circumference with his wear of reduction kit, cont manual lymph drainage and have pt measured for flat knit, when ready, for Lt and possibly Rt.Marland Kitchen   Consulted and Agree with Plan of Care Patient      Patient will  benefit from skilled therapeutic intervention in order to improve the following deficits and impairments:  Abnormal gait, Decreased balance, Decreased mobility, Decreased strength, Difficulty walking, Postural dysfunction, Decreased knowledge of precautions, Increased edema  Visit Diagnosis: Lymphedema, not elsewhere classified     Problem List Patient Active Problem List   Diagnosis Date Noted  . Lymphedema 02/10/2017  . BPH associated with nocturia 05/16/2016  . Senile purpura ([place]) 05/03/2016  . OSA (obstructive sleep apnea) 12/28/2015  . Hypersomnia 11/15/2015  . Cough 11/15/2015  . 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 Dizziness 12/30/2014  . Diastolic dysfunction 73/71/0626  . Former smoker 04/29/2014  . Chronic venous insufficiency 02/20/2014  . Hyperglycemia 08/02/2012  . Obesity 07/13/2010  . History of malignant melanoma. left leg 03/09/2009  . Insomnia 10/03/2007  . Hyperlipidemia 12/13/2006  . Essential hypertension 12/13/2006  Otelia Limes, PTA 03/21/2017, 5:23 PM  [facility] [place], [place], [address] Phone: [phone]   Fax:  [phone]  Name: Nicholas [name] MRN: [id number] Date of Birth: Nov 30, 1945

## 2017-03-27 ENCOUNTER — Encounter: Payer: PPO | Admitting: Physical Therapy

## 2017-03-29 ENCOUNTER — Ambulatory Visit: Payer: PPO | Attending: Family Medicine | Admitting: Physical Therapy

## 2017-03-29 DIAGNOSIS — I890 Lymphedema, not elsewhere classified: Secondary | ICD-10-CM

## 2017-03-29 NOTE — Therapy (Signed)
[place], [place], [address] Phone: [phone]   Fax:  [phone]  Physical Therapy Treatment  Patient Details  Name: Nicholas [name] MRN: [id number] Date of Birth: 22-Oct-1945 Referring Provider: Dr. [name]   Encounter Date: 03/29/2017      PT End of Session - 03/29/17 1724    Visit Number 16  6 for lymph    Number of Visits 18   Date for PT Re-Evaluation 04/10/17   PT Start Time 1610   PT Stop Time 1700   PT Time Calculation (min) 50 min   Activity Tolerance Patient tolerated treatment well   Behavior During Therapy [facility] for tasks assessed/performed      Past Medical History:  Diagnosis Date  . Cancer [facility]) Jan 2008   skin; hx of melanoma left foot/ amputation of toes 1&2   . Hyperlipidemia   . Hypertension   . Lymphedema     Past Surgical History:  Procedure Laterality Date  . 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 removal of melanoma of left foot/amputation of toes 1&25 Jul 2006  . WRIST FRACTURE SURGERY     71 years old-set    There were no vitals filed for this visit.      Subjective Assessment - 03/29/17 1621    Subjective pt has been wearing the velcro garments.  He brings his circular knit compression stockings and plans to get flat knits next time.  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   Currently in Pain? No/denies               LYMPHEDEMA/ONCOLOGY QUESTIONNAIRE - 03/29/17 1630      Right Lower Extremity Lymphedema   At Midpatella/Popliteal Crease 42.3 cm   30 cm Proximal to Floor at Lateral Plantar Foot 48  cm   20 cm Proximal to Floor at Lateral Plantar Foot 42._0 cm Proximal to Floor at Lateral Malleoli 37 cm   5 cm Proximal to 1st MTP Joint 29 cm   Across MTP Joint 24.7 cm   Around Proximal Great Toe 8.7 cm     Left Lower Extremity Lymphedema   At Midpatella/Popliteal Crease 37 cm   30 cm Proximal to Floor at Lateral Plantar Foot 45.8 cm   20 cm Proximal to Floor at Lateral Plantar Foot 40.6 cm   10 cm Proximal to Floor at Lateral Malleoli 33.8 cm   5 cm Proximal to 1st MTP Joint 28.4 cm   Across MTP Joint 28 cm   Around Proximal Great Toe 8.8 cm                  [facility] Adult PT Treatment/Exercise - 03/29/17 0001      Manual Therapy   Manual Therapy Edema management   Manual therapy comments Took circumference measurements of bil LE's Refit and cut off reduction kits to adjust to reductions. added a piece of pac band to the lower part of each leg to add compression to this area and allow pt to wear his shoes.  Suggested pt try  to do water walking in chest high water at the [facility] to see if he sees any difference in his legs.    Edema Management Demographics sent to [place]  If pt cannot afford pump, contact [employer] for pump and see if they will accept his other pump as donation                      PT Short Term Goals - 02/23/17 1111      PT SHORT TERM GOAL #1   Title Pt will be independent with HEP for improved balance, transfers and gait.  TARGET updated to 02/21/17.    Baseline Pt able to return demo, but is not performing HEP at home.  02/23/17   Time 4   Period Weeks   Status Partially Met     PT SHORT TERM GOAL #2   Title Pt will improve TUG score to less than or equal to 13 seconds for decreased fall risk.   Baseline 12.47 best of 3 trials 02/23/17   Time 4   Period Weeks   Status Partially Met     PT SHORT TERM GOAL #3   Title Pt wil perform at least 8 of 10 reps of sit<>stand transfers from 18 inch surfaces or below, without UE support,  independently for improved transfer efficiency and safety.   Time 4   Period Weeks   Status Achieved     PT SHORT TERM GOAL #4   Title Pt will verbalize undersatnding of techniques to reduce freezing episodes with gait and turns   Status Achieved     PT SHORT TERM GOAL #5   Title Pt will improve FGA to at least 16/30 for decreased fall risk.   Baseline 17/30 on 02/23/17   Time 4   Period Weeks   Status Achieved         Short Term Clinic Goals - 03/29/17 1730      CC Short Term Goal  #1   Title pt will have reducton of right leg at 10 cm proximal to lateral foot to 36 cm    Baseline 38 cm on eval,  37 cm on 03/29/2017   Status Achieved     CC Short Term Goal  #2   Title pt will have reduction of left leg at 10 cm proximal to lateral foot to 33 cm    Baseline 34 cm on eval , 33.8 cm at 03/29/2017   Status On-going          PT Long Term Goals - 03/09/17 1112      PT LONG TERM GOAL #1   Title Pt will verbalize understanding of fall prevention within the home environment.  TARGET updated 03/24/17   Time 8   Period Weeks   Status Achieved     PT LONG TERM GOAL #2   Title Pt will improve TUG manual to less than or equal to 18 seconds for decreased fall risk/improved dual tasking with gait.   Time 8   Period Weeks   Status Achieved     PT LONG TERM GOAL #3   Title Pt will improve gait velocity with conversation to at least 3 ft/sec for improved gait efficiency and safety.   Time 8   Period Weeks   Status Not Met     PT LONG TERM GOAL #4   Title Pt will improve Functional Gait Assessment score to at least 19/30 for decreased fall risk.  Time 8   Period Weeks   Status Not Met     PT LONG TERM GOAL #5   Title Pt will verbalize plans for optimal continued community fitness upon D/C from PT.   Time 8   Period Weeks   Status Achieved           Long Term Clinic Goals - 02/27/17 1816      CC Long Term Goal  #1   Title Pt will have strategy for long term managment  of lymphedema including intermittent compression pump and compression stockings.    Time 6   Period Weeks   Status New     CC Long Term Goal  #2   Title pt will have reduction of right leg at 10 cm proximal to lateral foot to 34 cm    Baseline 38 cm    Period Weeks     CC Long Term Goal  #3   Title pt will have reduction of left leg at 10 cm proximal to lateral foot to 32 cm    Baseline 34   Time 6   Period Weeks   Status New            Plan - 03/29/17 1725    Clinical Impression Statement Pt reports he has been wearing his compression garments some every day.  He shows good reduction in left LE today, but continues to have more swelling in his right leg "its my worst leg"  Refit reduction kits and added a pac band to lower leg to provide compression to ankle and allow him to wear his shoes. Discussed compression pumps and will move forward with [employer]    Rehab Potential Good   Clinical Impairments Affecting Rehab Potential previous left inguinal node dissection., longstanding lymphedema    PT Frequency 2x / week   PT Duration 6 weeks   PT Next Visit Plan Pt to come 1x/wk for next few weeks to assess his progress/measure circumference with his wear of reduction kit, cont manual lymph drainage and have pt measured for flat knit, when ready, for Lt and possibly Rt.. Reinforce elevatoin and execise, follow up with compression pump.  If he cannot afford pump from [employer], follow up with [facility].       Patient will benefit from skilled therapeutic intervention in order to improve the following deficits and impairments:  Abnormal gait, Decreased balance, Decreased mobility, Decreased strength, Difficulty walking, Postural dysfunction, Decreased knowledge of precautions, Increased edema  Visit Diagnosis: Lymphedema, not elsewhere classified     Problem List Patient Active Problem List   Diagnosis Date Noted  . Lymphedema 02/10/2017  . BPH associated with nocturia 05/16/2016    . Senile purpura ([place]) 05/03/2016  . OSA (obstructive sleep apnea) 12/28/2015  . Hypersomnia 11/15/2015  . Cough 11/15/2015  . 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 Dizziness 12/30/2014  . Diastolic dysfunction 92/07/69  . Former smoker 04/29/2014  . Chronic venous insufficiency 02/20/2014  . Hyperglycemia 08/02/2012  . Obesity 07/13/2010  . History of malignant melanoma. left leg 03/09/2009  . Insomnia 10/03/2007  . Hyperlipidemia 12/13/2006  . Essential hypertension 12/13/2006   [name]  [name] 03/29/2017, 5:32 PM  [place], [place], [address] Phone: [phone]   Fax:  [phone]  Name: TAMARIO HEAL MRN: [id number] Date of Birth: 05-19-1946

## 2017-04-01 NOTE — Progress Notes (Signed)
Nicholas [name] was seen today in the movement disorders clinic for neurologic consultation at the request of Dr. Posey [name].  The consultation is for the evaluation of [name] disease.  I have reviewed records both from Dr. Posey [name] as welll as from Dr. Jennelle [name].  The patient is accompanied by his wife who supplements the history.  The patient first presented to Dr. Posey [name] in June, 2016 with lightheadedness and dizziness.  He reported that that symptom had been present since 2015.  Dr. Posey [name] noted that the patient looked parkinsonian.  In September, 2016 she started him on ropinirole, 0.25 mg twice a day but he stopped the medication after only a few days because of worsening of dizziness.  He saw Dr. Nicki [name] in December, 2016 for a second opinion and Dr. Nicki [name] felt that he had idiopathic Parkinson's disease and started him on levodopa.  He last saw Dr. Nicki [name] in February, 2016 and he increased the patient's carbidopa/levodopa 25/100-1-1/2 tablets in the morning, one tablet in the afternoon and one tablet in the evening.  The patient is still on this dosage.  The patient had an MRI of the brain that I had the opportunity to review.  This was done without gadolinium in October, 2016.  There were only a few scattered T2 hyperintensities and it was otherwise unremarkable.  07/31/16 update:  The patient follows up today.  He is on carbidopa/levodopa 25/100, 1-1/2 tablets in the morning, one tablet in the afternoon and one in the evening.  Pt denies falls.  Admits to lightheadedness but no near syncope.  No hallucinations.  Mood has been good.  No longer in [employer] moves classes but in [place] biking classes (monday/thurs).    11/28/16 update:  Patient seen today in follow-up.  Patients levodopa was increased last visit to carbidopa/levodopa 25/100, 1-1/2 tablets 3 times per day.  He states that made him dizzy.  Pt denies falls. No hallucinations.  Mood has been good.  Went to [facility] for his lymphedema and just  increased his mm Hg on his lymphedema.  Saw Dr. Nicki [name] since our last visit as well.  Told him to increase levodopa to 1.5/1.5/1.  Going to [facility] cycle class 2 days a week.  Starting PT on 5/24.    04/03/17 update:  Pt f/u today for PD, accompanied by his wife, who supplements the history.  The records that were made available to me were reviewed since last visit.  On carbidopa/levodopa 25/100, 1.5 tablets at 6am/1.5 tabletsat 12:00pm/1 tablet at 5pm.  "I need drugs, girl."  More freezing but doesn't know if med responsive.  Been to PT/OT/ST since last visit both for PD as well as for his lymphedema.  Wearing off:  Doesn't know  How long before next dose:  ? Falls:   Yes.    Exercising: mon/thurs [employer] class N/V:  Yes.   (AM nausea upon awakening before meds but goes away) Hallucinations:  No.  visual distortions: No. Lightheaded:  Yes.    Syncope: No. Dyskinesia:  No.   PREVIOUS MEDICATIONS: Sinemet and requip (only few days and was dizzy and was d/c)  ALLERGIES:   Allergies  Allergen Reactions  . Amoxicillin Swelling    Presumed angioedema of lips due to prednisone    CURRENT MEDICATIONS:  Outpatient Encounter Prescriptions as of 04/03/2017  Medication Sig  . diphenhydrAMINE (BENADRYL) 25 mg capsule Take 25 mg by mouth at bedtime.  . rosuvastatin (CRESTOR) 10 MG tablet TAKE 1 TABLET  ONCE DAILY.  . carbidopa-levodopa (SINEMET IR) 25-100 MG tablet Take by mouth See admin instructions. 1.5 tab in the morning, 1.5 tab in the afternoon, 1 tab in the evening  . mirabegron ER (MYRBETRIQ) 25 MG TB24 tablet Take 1 tablet (25 mg total) by mouth daily. (Patient not taking: Reported on 03/29/2017)   No facility-administered encounter medications on file as of 04/03/2017.     PAST MEDICAL HISTORY:   Past Medical History:  Diagnosis Date  . 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 Hyperlipidemia   . Hypertension   . Lymphedema     PAST  SURGICAL HISTORY:   Past Surgical History:  Procedure Laterality Date  . 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 removal of melanoma of left foot/amputation of toes 1&25 Jul 2006  . WRIST FRACTURE SURGERY     71 years old-set    SOCIAL HISTORY:   Social History   Social History  . Marital status: Married    Spouse name: N/A  . Number of children: N/A  . Years of education: N/A   Occupational History  . retired     Engineer, maintenance (IT)   Social History Main Topics  . Smoking status: Former Smoker    Packs/day: 1.00    Years: 16.00    Types: Cigarettes    Quit date: 12/22/1993  . Smokeless tobacco: Never Used  . Alcohol use No     Comment: one time every few months now  . Drug use: No  . Sexual activity: Not on file   Other Topics Concern  . Not on file   Social History Narrative   Married 1981. Wife has kids-1 with 1 adopted grandchild.       Retired Tax adviser      Highest level of education:  B.S.      Hobbies: antiques, former [employer] cabin crew      Exercise: none currently.     FAMILY HISTORY:   Family Status  Relation Status  . Father Deceased at age 37  . Mother Deceased at age 32  . Sister Alive  . Brother Alive    ROS:  A complete 10 system review of systems was obtained and was unremarkable apart from what is mentioned above.  PHYSICAL EXAMINATION:    VITALS:   Vitals:   04/03/17 1443  BP: 120/80  Pulse: 81  SpO2: 96%  Weight: 232 lb 8 oz (105.5 kg)  Height: 5\' 7"  (1.702 m)    GEN:  The patient appears stated age and is in NAD. HEENT:  Normocephalic, atraumatic.  The mucous membranes are moist. The superficial temporal arteries are without ropiness or tenderness. CV:  RRR Lungs:  CTAB Neck/HEME:  There are no carotid bruits bilaterally. Skin:  LE edema is significant but chronic  Neurological examination:  Orientation: The patient is alert and oriented x3.  Cranial nerves: There is good facial symmetry. There is facial hypomimia.  Extraocular muscles are  intact. The visual fields are full to confrontational testing. The speech is fluent and clear. Soft palate rises symmetrically and there is no tongue deviation. Hearing is intact to conversational tone. Sensation: Sensation is intact to light touch throughout Motor: Strength is 5/5 in the bilateral upper and lower extremities.   Shoulder shrug is equal and symmetric.  There is no pronator drift.  Movement examination: Tone: There is mild to mod increased tone in the LUE Abnormal movements: There  is none Coordination:  There is decremation with RAM's, seen with alternation of alternation of supination/pronation of the forearm bilaterally Gait and Station: The patient has no difficulty arising out of a deep-seated chair without the use of the hands. He has start hesitation.  He stops in the doorway and purposefully takes a large step over the threshold.   He then walks down the hall with good stride length.  He has a positive pull test  ASSESSMENT/PLAN:  1.  Idiopathic Parkinson's disease but I do think that PSP could be on the Ddx as he does have a fixed face stare/eyes.    -We discussed the diagnosis as well as pathophysiology of the disease.  We discussed treatment options as well as prognostic indicators.  Patient education was provided.  -he would like to try to increase his carbidopa/levodopa 25/100.  He will try one more dose and take 1.5 tablets at 6am/10am/3pm and 1 tablet at 7pm.  He will let me know if that increases dizziness.  Risks, benefits, side effects and alternative therapies were discussed.  The opportunity to ask questions was given and they were answered to the best of my ability.  The patient expressed understanding and willingness to follow the outlined treatment protocols.  -needs to use walker at all times.  He refuses 2.  History of melanoma  -I had a long discussion with the patient regarding the fact that we used to believe that levodopa increased once risk for melanoma,  but it is now believed that patients who have Parkinson's disease have an increased risk for melanoma.  He should continue to get his yearly skin checks. Last appt in July and was okay 3.  Vasomotor rhinorrhea  -mild and he is not ready for meds 4.  HTN  -now off meds and feels that he is doing well. Following with Dr. Yong Channel and his notes reviewed. 5.  Follow up is anticipated in the next few months, sooner should new neurologic issues arise.  Much greater than 50% of this visit was spent in counseling and coordinating care.  Total face to face time:  25 min

## 2017-04-03 ENCOUNTER — Ambulatory Visit (INDEPENDENT_AMBULATORY_CARE_PROVIDER_SITE_OTHER): Payer: PPO | Admitting: Neurology

## 2017-04-03 ENCOUNTER — Encounter: Payer: Self-pay | Admitting: Neurology

## 2017-04-03 VITALS — BP 120/80 | HR 81 | Ht 67.0 in | Wt 232.5 lb

## 2017-04-03 DIAGNOSIS — Z85820 Personal history of malignant melanoma of skin: Secondary | ICD-10-CM | POA: Diagnosis not present

## 2017-04-03 DIAGNOSIS — G20 Parkinson's disease: Secondary | ICD-10-CM

## 2017-04-03 NOTE — Patient Instructions (Signed)
Increase your carbidopa/levodopa 25/100 take 1.5 tablets at 6am/10am/3pm and 1 tablet at 7pm.  You need to USE A WALKER!

## 2017-04-04 ENCOUNTER — Ambulatory Visit: Payer: PPO

## 2017-04-04 DIAGNOSIS — I890 Lymphedema, not elsewhere classified: Secondary | ICD-10-CM

## 2017-04-04 NOTE — Therapy (Signed)
[place], [place], [address] Phone: [phone]   Fax:  [phone]  Physical Therapy Treatment  Patient Details  Name: Nicholas [name] MRN: [id number] Date of Birth: Aug 09, 1945 Referring Provider: Dr. [name]   Encounter Date: 04/04/2017      PT End of Session - 04/04/17 1708    Visit Number 17  7 for lymph   Number of Visits 18   Date for PT Re-Evaluation 04/10/17   PT Start Time 1610   PT Stop Time 1650   PT Time Calculation (min) 40 min   Activity Tolerance Patient tolerated treatment well   Behavior During Therapy [facility] for tasks assessed/performed      Past Medical History:  Diagnosis Date  . 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 Hyperlipidemia   . Hypertension   . Lymphedema     Past Surgical History:  Procedure Laterality Date  . 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 removal of melanoma of left foot/amputation of toes 1&25 Jul 2006  . WRIST FRACTURE SURGERY     71 years old-set    There were no vitals filed for this visit.      Subjective Assessment - 04/04/17 1626    Subjective Didn't wear the velcro garments today bc I had to work and it's hard to walk in them all day. But have been wearing them every day until today since I was here last.    Pertinent History lymphedema bilateral lower extremities, malignant melanoma lower leg s/p toe amputation L foot; obesity, CHF, OSA   Patient Stated Goals Pt's goal for therapy is to manage Parkinson's disease symptoms.   Currently in Pain? No/denies                         [facility] Adult PT Treatment/Exercise - 04/04/17 0001      Self-Care   Other Self-Care Comments  Discussed with pt at length flat knit garments and options he can get like off the shelf [employer]. Measured him for this at his request, with his legs swollen right now he would have trouble  fitting legs into largest size so suggested pt to make an appt at [place] next week (and to make sure to wear velcro compression garments a few days before being measured) so they could go over off the shelf or custom flat knit options. Pt verbalized understanding all this and wrote down [email or web address] website for pt as he would also like to research options on his own.      Manual Therapy   Manual Lymphatic Drainage (MLD) In Supine: Short neck, 5 diaphragmatic breaths, Rt axilla nodes and Rt inguino-axillary anastomosis, and Rt LE from dorsal foot to lateral hip working from proximal to distal then retracing steps. Did not have time to do Rt LE today.                   PT Short Term Goals - 02/23/17 1111      PT SHORT TERM GOAL #1   Title Pt will be independent with HEP for improved balance, transfers and gait.  TARGET updated to 02/21/17.    Baseline Pt able to return demo, but is not performing HEP at home.  02/23/17   Time 4   Period Weeks   Status Partially Met  PT SHORT TERM GOAL #2   Title Pt will improve TUG score to less than or equal to 13 seconds for decreased fall risk.   Baseline 12.47 best of 3 trials 02/23/17   Time 4   Period Weeks   Status Partially Met     PT SHORT TERM GOAL #3   Title Pt wil perform at least 8 of 10 reps of sit<>stand transfers from 18 inch surfaces or below, without UE support, independently for improved transfer efficiency and safety.   Time 4   Period Weeks   Status Achieved     PT SHORT TERM GOAL #4   Title Pt will verbalize undersatnding of techniques to reduce freezing episodes with gait and turns   Status Achieved     PT SHORT TERM GOAL #5   Title Pt will improve FGA to at least 16/30 for decreased fall risk.   Baseline 17/30 on 02/23/17   Time 4   Period Weeks   Status Achieved         Short Term Clinic Goals - 03/29/17 1730      CC Short Term Goal  #1   Title pt will have reducton of right leg at 10 cm  proximal to lateral foot to 36 cm    Baseline 38 cm on eval,  37 cm on 03/29/2017   Status Achieved     CC Short Term Goal  #2   Title pt will have reduction of left leg at 10 cm proximal to lateral foot to 33 cm    Baseline 34 cm on eval , 33.8 cm at 03/29/2017   Status On-going          PT Long Term Goals - 03/09/17 1112      PT LONG TERM GOAL #1   Title Pt will verbalize understanding of fall prevention within the home environment.  TARGET updated 03/24/17   Time 8   Period Weeks   Status Achieved     PT LONG TERM GOAL #2   Title Pt will improve TUG manual to less than or equal to 18 seconds for decreased fall risk/improved dual tasking with gait.   Time 8   Period Weeks   Status Achieved     PT LONG TERM GOAL #3   Title Pt will improve gait velocity with conversation to at least 3 ft/sec for improved gait efficiency and safety.   Time 8   Period Weeks   Status Not Met     PT LONG TERM GOAL #4   Title Pt will improve Functional Gait Assessment score to at least 19/30 for decreased fall risk.   Time 8   Period Weeks   Status Not Met     PT LONG TERM GOAL #5   Title Pt will verbalize plans for optimal continued community fitness upon D/C from PT.   Time 8   Period Weeks   Status Achieved           Long Term Clinic Goals - 02/27/17 1816      CC Long Term Goal  #1   Title Pt will have strategy for long term managment of lymphedema including intermittent compression pump and compression stockings.    Time 6   Period Weeks   Status New     CC Long Term Goal  #2   Title pt will have reduction of right leg at 10 cm proximal to lateral foot to 34 cm    Baseline 38 cm  Period Weeks     CC Long Term Goal  #3   Title pt will have reduction of left leg at 10 cm proximal to lateral foot to 32 cm    Baseline 34   Time 6   Period Weeks   Status New            Plan - 04/04/17 1710    Clinical Impression Statement Pt did not have his velcro compression  garments on today so did not measure his circumference but did measure him to see if he would fit into a [employer] flat knit garment. Today his legs were too swollen for him to fit into their sizes so suggestd to pt to call [facility] (see flowsheet) so they could show him more options. Pt did get a message from [employer] yesterday but did not get to call them back yet so encouraged him to do this as well as this will be another option for him to help control his lymphedema.    Rehab Potential Good   Clinical Impairments Affecting Rehab Potential previous left inguinal node dissection., longstanding lymphedema    PT Frequency 2x / week   PT Duration 6 weeks   PT Treatment/Interventions ADLs/Self Care Home Management;Patient/family education;Orthotic Fit/Training;Manual lymph drainage;Compression bandaging   PT Next Visit Plan Needs renewal or D/C next visit. Pt to come 1x/wk for next few weeks to assess his progress/measure circumference with his wear of reduction kit, cont manual lymph drainage and have pt measured for flat knit, when ready, for Lt and possibly Rt.. Reinforce elevatoin and execise, follow up with compression pump.  If he cannot afford pump from [employer], follow up with [facility].    Consulted and Agree with Plan of Care Patient      Patient will benefit from skilled therapeutic intervention in order to improve the following deficits and impairments:  Abnormal gait, Decreased balance, Decreased mobility, Decreased strength, Difficulty walking, Postural dysfunction, Decreased knowledge of precautions, Increased edema  Visit Diagnosis: Lymphedema, not elsewhere classified     Problem List Patient Active Problem List   Diagnosis Date Noted  . Lymphedema 02/10/2017  . BPH associated with nocturia 05/16/2016  . Senile purpura ([place]) 05/03/2016  . OSA (obstructive sleep apnea) 12/28/2015  . Hypersomnia 11/15/2015  . Cough 11/15/2015  . 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 Dizziness 12/30/2014  . Diastolic dysfunction 38/87/1959  . Former smoker 04/29/2014  . Chronic venous insufficiency 02/20/2014  . Hyperglycemia 08/02/2012  . Obesity 07/13/2010  . History of malignant melanoma. left leg 03/09/2009  . Insomnia 10/03/2007  . Hyperlipidemia 12/13/2006  . Essential hypertension 12/13/2006    Otelia Limes, PTA 04/04/2017, 5:18 PM  [place], [place], [address] Phone: [phone]   Fax:  [phone]  Name: Nicholas [name] MRN: [id number] Date of Birth: [birth year]

## 2017-04-11 ENCOUNTER — Ambulatory Visit: Payer: PPO

## 2017-04-11 DIAGNOSIS — I890 Lymphedema, not elsewhere classified: Secondary | ICD-10-CM | POA: Diagnosis not present

## 2017-04-11 NOTE — Therapy (Signed)
[place], [place], [address] Phone: [phone]   Fax:  [phone]  Physical Therapy Treatment  Patient Details  Name: Nicholas [name] MRN: [id number] Date of Birth: 1946/05/25 Referring Provider: Dr. [name]   Encounter Date: 04/11/2017      PT End of Session - 04/11/17 1654    Visit Number 18  8 for lymph    Number of Visits 18   Date for PT Re-Evaluation 04/10/17   PT Start Time 1608   PT Stop Time 1650   PT Time Calculation (min) 42 min   Activity Tolerance Patient tolerated treatment well   Behavior During Therapy [facility] for tasks assessed/performed      Past Medical History:  Diagnosis Date  . Cancer [facility]) Jan 2008   skin; hx of melanoma left foot/ amputation of toes 1&2   . Hyperlipidemia   . Hypertension   . Lymphedema     Past Surgical History:  Procedure Laterality Date  . 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 removal of melanoma of left foot/amputation of toes 1&25 Jul 2006  . WRIST FRACTURE SURGERY     71 years old-set    There were no vitals filed for this visit.      Subjective Assessment - 04/11/17 1616    Subjective Didn't wear the velcro garments yesterday bc I had a busy workday but I've worn them all day today. Overall I know what I need to do and feel like I have a good understanding. I'd like to come one more time after I get my flat knit garments.     Pertinent History lymphedema bilateral lower extremities, malignant melanoma lower leg s/p toe amputation L foot; obesity, CHF, OSA   Patient Stated Goals Pt's goal for therapy is to manage Parkinson's disease symptoms.   Currently in Pain? No/denies               LYMPHEDEMA/ONCOLOGY QUESTIONNAIRE - 04/11/17 1621      Right Lower Extremity Lymphedema   At Midpatella/Popliteal Crease 40.9 cm   30 cm Proximal to Floor at Lateral Plantar Foot 47.9 cm   20 cm Proximal to Floor at Lateral Plantar Foot  42.'2 1   10 ' cm Proximal to Floor at Lateral Malleoli 36.4 cm   5 cm Proximal to 1st MTP Joint 27 cm   Across MTP Joint 23.8 cm   Around Proximal Great Toe 8.8 cm     Left Lower Extremity Lymphedema   At Midpatella/Popliteal Crease 37 cm   30 cm Proximal to Floor at Lateral Plantar Foot 46.1 cm   20 cm Proximal to Floor at Lateral Plantar Foot 40.8 cm   10 cm Proximal to Floor at Lateral Malleoli 33.5 cm   5 cm Proximal to 1st MTP Joint 27.8 cm   Across MTP Joint 25.6 cm   Around Proximal Great Toe 9.1 cm                  [facility] Adult PT Treatment/Exercise - 04/11/17 0001      Manual Therapy   Manual therapy comments Circumference measurements.   Manual Lymphatic Drainage (MLD) In Supine: Short neck, 5 diaphragmatic breaths, Rt axilla nodes and Rt inguino-axillary anastomosis, and Rt LE from dorsal foot to lateral hip working from proximal to distal then retracing steps.                  PT Short Term Goals -  02/23/17 1111      PT SHORT TERM GOAL #1   Title Pt will be independent with HEP for improved balance, transfers and gait.  TARGET updated to 02/21/17.    Baseline Pt able to return demo, but is not performing HEP at home.  02/23/17   Time 4   Period Weeks   Status Partially Met     PT SHORT TERM GOAL #2   Title Pt will improve TUG score to less than or equal to 13 seconds for decreased fall risk.   Baseline 12.47 best of 3 trials 02/23/17   Time 4   Period Weeks   Status Partially Met     PT SHORT TERM GOAL #3   Title Pt wil perform at least 8 of 10 reps of sit<>stand transfers from 18 inch surfaces or below, without UE support, independently for improved transfer efficiency and safety.   Time 4   Period Weeks   Status Achieved     PT SHORT TERM GOAL #4   Title Pt will verbalize undersatnding of techniques to reduce freezing episodes with gait and turns   Status Achieved     PT SHORT TERM GOAL #5   Title Pt will improve FGA to at least 16/30 for  decreased fall risk.   Baseline 17/30 on 02/23/17   Time 4   Period Weeks   Status Achieved         Short Term Clinic Goals - 03/29/17 1730      CC Short Term Goal  #1   Title pt will have reducton of right leg at 10 cm proximal to lateral foot to 36 cm    Baseline 38 cm on eval,  37 cm on 03/29/2017   Status Achieved     CC Short Term Goal  #2   Title pt will have reduction of left leg at 10 cm proximal to lateral foot to 33 cm    Baseline 34 cm on eval , 33.8 cm at 03/29/2017   Status On-going          PT Long Term Goals - 03/09/17 1112      PT LONG TERM GOAL #1   Title Pt will verbalize understanding of fall prevention within the home environment.  TARGET updated 03/24/17   Time 8   Period Weeks   Status Achieved     PT LONG TERM GOAL #2   Title Pt will improve TUG manual to less than or equal to 18 seconds for decreased fall risk/improved dual tasking with gait.   Time 8   Period Weeks   Status Achieved     PT LONG TERM GOAL #3   Title Pt will improve gait velocity with conversation to at least 3 ft/sec for improved gait efficiency and safety.   Time 8   Period Weeks   Status Not Met     PT LONG TERM GOAL #4   Title Pt will improve Functional Gait Assessment score to at least 19/30 for decreased fall risk.   Time 8   Period Weeks   Status Not Met     PT LONG TERM GOAL #5   Title Pt will verbalize plans for optimal continued community fitness upon D/C from PT.   Time 8   Period Weeks   Status Achieved           Long Term Clinic Goals - 02/27/17 1816      CC Long Term Goal  #1  Title Pt will have strategy for long term managment of lymphedema including intermittent compression pump and compression stockings.    Time 6   Period Weeks   Status New     CC Long Term Goal  #2   Title pt will have reduction of right leg at 10 cm proximal to lateral foot to 34 cm    Baseline 38 cm    Period Weeks     CC Long Term Goal  #3   Title pt will have reduction  of left leg at 10 cm proximal to lateral foot to 32 cm    Baseline 34   Time 6   Period Weeks   Status New            Plan - 04/11/17 1654    Clinical Impression Statement Pt came in wearing his compression garments reporting he didn't wear them yesterday due to work but worn them all day today. He wears them ~5x/week and almost all night (takes them off about 4). Hasn't researched flat knit garment yet like he wants but reports feeling informed for what to look for and can also call (which is what this therapsit suggested he do) [facility] and be measured and further educated about garment options there. Pt has also yet to call back Matt from [employer] regarding the compression pump but can do this when he is ready. Discussed with pt and he is agreeable to being placed on hold after today until he gets flat knit garments and then will come back for one more session as he would like to have [place] assess them and D/C at that visit.    Rehab Potential Good   Clinical Impairments Affecting Rehab Potential previous left inguinal node dissection., longstanding lymphedema    PT Frequency 2x / week   PT Duration 6 weeks   PT Treatment/Interventions ADLs/Self Care Home Management;Patient/family education;Orthotic Fit/Training;Manual lymph drainage;Compression bandaging   PT Next Visit Plan D/C next visit as pt plans to come back one more time after he recieves his flat knit compression garments. If he has contacted [employer] and finds he cannot afford pump can try following up with Waneta Martins if pt agreeable to this.    Consulted and Agree with Plan of Care Patient      Patient will benefit from skilled therapeutic intervention in order to improve the following deficits and impairments:  Abnormal gait, Decreased balance, Decreased mobility, Decreased strength, Difficulty walking, Postural dysfunction, Decreased knowledge of precautions, Increased edema  Visit Diagnosis: Lymphedema, not elsewhere  classified     Problem List Patient Active Problem List   Diagnosis Date Noted  . Lymphedema 02/10/2017  . BPH associated with nocturia 05/16/2016  . Senile purpura ([place]) 05/03/2016  . OSA (obstructive sleep apnea) 12/28/2015  . Hypersomnia 11/15/2015  . Cough 11/15/2015  . 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 Dizziness 12/30/2014  . Diastolic dysfunction 16/04/9603  . Former smoker 04/29/2014  . Chronic venous insufficiency 02/20/2014  . Hyperglycemia 08/02/2012  . Obesity 07/13/2010  . History of malignant melanoma. left leg 03/09/2009  . Insomnia 10/03/2007  . Hyperlipidemia 12/13/2006  . Essential hypertension 12/13/2006    Otelia Limes, PTA 04/11/2017, 5:00 PM  [place], [place], [address] Phone: [phone]   Fax:  [phone]  Name: JAIMEN MELONE MRN: [id number] Date of Birth: 1945/10/08

## 2017-04-18 DIAGNOSIS — G4733 Obstructive sleep apnea (adult) (pediatric): Secondary | ICD-10-CM | POA: Diagnosis not present

## 2017-04-30 ENCOUNTER — Telehealth: Payer: Self-pay | Admitting: Family Medicine

## 2017-04-30 ENCOUNTER — Telehealth: Payer: Self-pay | Admitting: Oncology

## 2017-04-30 NOTE — Telephone Encounter (Signed)
Patients wife called to cancel 10/11 appointment.

## 2017-04-30 NOTE — Telephone Encounter (Signed)
Patient's wife called in reference to Rx mirabegron ER (MYRBETRIQ) 25 MG TB24 tablet for patient. Patient believes this is making his situation worse. Please call patient and advise.

## 2017-05-01 NOTE — Telephone Encounter (Signed)
If medication wasn't helping- stopping it was appropriate

## 2017-05-01 NOTE — Telephone Encounter (Signed)
Called and spoke with patient who states he quit taking this medication as he state he didn't think it was working.

## 2017-05-03 ENCOUNTER — Ambulatory Visit: Payer: PPO | Admitting: Oncology

## 2017-05-07 ENCOUNTER — Ambulatory Visit (INDEPENDENT_AMBULATORY_CARE_PROVIDER_SITE_OTHER): Payer: PPO

## 2017-05-07 DIAGNOSIS — Z23 Encounter for immunization: Secondary | ICD-10-CM | POA: Diagnosis not present

## 2017-05-08 ENCOUNTER — Other Ambulatory Visit: Payer: Self-pay | Admitting: Neurology

## 2017-05-08 MED ORDER — CARBIDOPA-LEVODOPA 25-100 MG PO TABS: ORAL_TABLET | ORAL | 1 refills | Status: DC

## 2017-05-11 ENCOUNTER — Telehealth: Payer: Self-pay | Admitting: Family Medicine

## 2017-05-11 NOTE — Telephone Encounter (Signed)
Patient needs to know about his claims that were denied with [employer]. Patient is not sure who he saw a few months ago with [employer] and needs the nurse to provide that information to him. Please call patient to help advise.

## 2017-05-14 NOTE — Telephone Encounter (Signed)
His insurance is [employer] advantage. My guess is it was an out of network provider- Marcene Brawn can you call and get more info? Lea may be able to help you as well with billing issues- not usually much a provider can do in these situations.

## 2017-05-16 ENCOUNTER — Ambulatory Visit: Payer: PPO | Attending: Family Medicine

## 2017-05-16 DIAGNOSIS — I890 Lymphedema, not elsewhere classified: Secondary | ICD-10-CM

## 2017-05-16 NOTE — Therapy (Signed)
[place], [place], [address] Phone: [phone]   Fax:  [phone]  Physical Therapy Treatment  Patient Details  Name: Nicholas [name] MRN: [id number] Date of Birth: 12-13-1945 Referring Provider: Dr. [name]   Encounter Date: 05/16/2017      PT End of Session - 05/16/17 1612    Visit Number 19  9 for lymph   Number of Visits 18   Date for PT Re-Evaluation 04/10/17   PT Start Time 9381   PT Stop Time 1609   PT Time Calculation (min) 53 min   Activity Tolerance Patient tolerated treatment well   Behavior During Therapy [facility] for tasks assessed/performed      Past Medical History:  Diagnosis Date  . Cancer [facility]) Jan 2008   skin; hx of melanoma left foot/ amputation of toes 1&2   . Hyperlipidemia   . Hypertension   . Lymphedema     Past Surgical History:  Procedure Laterality Date  . 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 removal of melanoma of left foot/amputation of toes 1&25 Jul 2006  . WRIST FRACTURE SURGERY     71 years old-set    There were no vitals filed for this visit.      Subjective Assessment - 05/16/17 1524    Subjective I got the Solaris ExoStrong for my Lt leg and wanted you to look at it before I get one for the Rt. I really like it except by the end of the day it has slid down into the front on my ankle and starts hurting. I got it about 17 days ago.    Pertinent History lymphedema bilateral lower extremities, malignant melanoma lower leg s/p toe amputation L foot; obesity, CHF, OSA   Patient Stated Goals Pt's goal for therapy is to manage Parkinson's disease symptoms.   Currently in Pain? No/denies               LYMPHEDEMA/ONCOLOGY QUESTIONNAIRE - 05/16/17 1527      Right Lower Extremity Lymphedema   At Midpatella/Popliteal Crease 41.3 cm  Pt hadn't worn compression garment all morning   30 cm Proximal to Floor at Lateral Plantar Foot 48.4 cm   20  cm Proximal to Floor at Lateral Plantar Foot 42.'9 1   10 ' cm Proximal to Floor at Lateral Malleoli 36 cm   5 cm Proximal to 1st MTP Joint 29.2 cm   Across MTP Joint 25.1 cm   Around Proximal Great Toe 8.7 cm                  [facility] Adult PT Treatment/Exercise - 05/16/17 0001      Manual Therapy   Manual therapy comments Circumference measurements taken of Rt LE and also measured pt for a [employer], he will need size Avg, XX-Large   Edema Management Donned Circaid reduction garment after cuting down to better fit pts reduced LE since initially having it fit for him.    Manual Lymphatic Drainage (MLD) In Supine: Short neck, 5 diaphragmatic breaths, Rt axilla nodes and Rt inguino-axillary anastomosis, and Rt LE from dorsal foot to lateral hip working from proximal to distal then retracing steps.                  PT Short Term Goals - 02/23/17 1111      PT SHORT TERM GOAL #1   Title Pt will be independent with HEP for improved balance,  transfers and gait.  TARGET updated to 02/21/17.    Baseline Pt able to return demo, but is not performing HEP at home.  02/23/17   Time 4   Period Weeks   Status Partially Met     PT SHORT TERM GOAL #2   Title Pt will improve TUG score to less than or equal to 13 seconds for decreased fall risk.   Baseline 12.47 best of 3 trials 02/23/17   Time 4   Period Weeks   Status Partially Met     PT SHORT TERM GOAL #3   Title Pt wil perform at least 8 of 10 reps of sit<>stand transfers from 18 inch surfaces or below, without UE support, independently for improved transfer efficiency and safety.   Time 4   Period Weeks   Status Achieved     PT SHORT TERM GOAL #4   Title Pt will verbalize undersatnding of techniques to reduce freezing episodes with gait and turns   Status Achieved     PT SHORT TERM GOAL #5   Title Pt will improve FGA to at least 16/30 for decreased fall risk.   Baseline 17/30 on 02/23/17   Time 4   Period Weeks    Status Achieved         Short Term Clinic Goals - 03/29/17 1730      CC Short Term Goal  #1   Title pt will have reducton of right leg at 10 cm proximal to lateral foot to 36 cm    Baseline 38 cm on eval,  37 cm on 03/29/2017   Status Achieved     CC Short Term Goal  #2   Title pt will have reduction of left leg at 10 cm proximal to lateral foot to 33 cm    Baseline 34 cm on eval , 33.8 cm at 03/29/2017   Status On-going          PT Long Term Goals - 03/09/17 1112      PT LONG TERM GOAL #1   Title Pt will verbalize understanding of fall prevention within the home environment.  TARGET updated 03/24/17   Time 8   Period Weeks   Status Achieved     PT LONG TERM GOAL #2   Title Pt will improve TUG manual to less than or equal to 18 seconds for decreased fall risk/improved dual tasking with gait.   Time 8   Period Weeks   Status Achieved     PT LONG TERM GOAL #3   Title Pt will improve gait velocity with conversation to at least 3 ft/sec for improved gait efficiency and safety.   Time 8   Period Weeks   Status Not Met     PT LONG TERM GOAL #4   Title Pt will improve Functional Gait Assessment score to at least 19/30 for decreased fall risk.   Time 8   Period Weeks   Status Not Met     PT LONG TERM GOAL #5   Title Pt will verbalize plans for optimal continued community fitness upon D/C from PT.   Time 8   Period Weeks   Status Achieved           Long Term Clinic Goals - 05/16/17 1619      CC Long Term Goal  #1   Title Pt will have strategy for long term managment of lymphedema including intermittent compression pump and compression stockings.    Baseline Pt has  received Circaid reduction garments for bil LE's and a flat knit for the Lt LE, plans to order one for the Rt in next few days, is still speaking with [employer] regarding compression pump to see if he will be able to afford one-05/16/17   Status Partially Met     CC Long Term Goal  #2   Title pt will have  reduction of right leg at 10 cm proximal to lateral foot to 34 cm    Baseline 38 cm ; 36 cm-05/16/17   Status Partially Met     CC Long Term Goal  #3   Title pt will have reduction of left leg at 10 cm proximal to lateral foot to 32 cm    Baseline 34; 33.5 cm-04/11/17   Status Achieved            Plan - 05/16/17 1613    Clinical Impression Statement Pt came for one last visit for [place] to assess the Solaris ExoStrong garment he ordered online per our measurements. He reports it being a very good fit and comfortable except for when it slips down his leg settling at his anterior ankle causing pain. Instructed him to use gloves he said he has to work garment up his leg throughout day to prevent this and he verbalized understanding. Mesured his Rt leg today and he will need the same size (Avg, XX-Large) except it might be too tight at ankle so instructed pt to wear his compression garment including the foot piece a few days to reduce ankle circumference if it is too tight/uncomfortable. He verbalized understanding this as well. Also let pt know that Matt from [employer] has been trying to get ahold of him so he says he is ready to talk to him to see if he will be able to get a compression pump. Pt is ready for D/C at this time.   Rehab Potential Good   Clinical Impairments Affecting Rehab Potential previous left inguinal node dissection., longstanding lymphedema    PT Frequency 2x / week   PT Duration 6 weeks   PT Treatment/Interventions ADLs/Self Care Home Management;Patient/family education;Orthotic Fit/Training;Manual lymph drainage;Compression bandaging   PT Next Visit Plan D/C this visit.    Consulted and Agree with Plan of Care Patient      Patient will benefit from skilled therapeutic intervention in order to improve the following deficits and impairments:  Abnormal gait, Decreased balance, Decreased mobility, Decreased strength, Difficulty walking, Postural dysfunction, Decreased knowledge of  precautions, Increased edema  Visit Diagnosis: Lymphedema, not elsewhere classified     Problem List Patient Active Problem List   Diagnosis Date Noted  . Lymphedema 02/10/2017  . BPH associated with nocturia 05/16/2016  . Senile purpura ([place]) 05/03/2016  . OSA (obstructive sleep apnea) 12/28/2015  . Hypersomnia 11/15/2015  . Cough 11/15/2015  . 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 Dizziness 12/30/2014  . Diastolic dysfunction 27/51/7001  . Former smoker 04/29/2014  . Chronic venous insufficiency 02/20/2014  . Hyperglycemia 08/02/2012  . Obesity 07/13/2010  . History of malignant melanoma. left leg 03/09/2009  . Insomnia 10/03/2007  . Hyperlipidemia 12/13/2006  . Essential hypertension 12/13/2006    Otelia Limes, PTA 05/16/2017, 4:22 PM  [place], [place], [address] Phone: [phone]   Fax:  [phone]  Name: TRIG MCBRYAR MRN: [id number] Date of Birth: 11-Feb-1946  PHYSICAL THERAPY DISCHARGE SUMMARY  Visits from Start of Care: 19  Current functional level related  to goals / functional outcomes: See above   Remaining deficits: None  Education / Equipment: velcro and flat knit compression garments Plan: Patient agrees to discharge.  Patient goals were partially met. Patient is being discharged due to meeting the stated rehab goals.  ?????    [facility], PT 05/16/2017, 4:40pm

## 2017-05-24 ENCOUNTER — Ambulatory Visit (INDEPENDENT_AMBULATORY_CARE_PROVIDER_SITE_OTHER): Payer: PPO | Admitting: Family Medicine

## 2017-05-24 ENCOUNTER — Encounter: Payer: Self-pay | Admitting: Family Medicine

## 2017-05-24 VITALS — BP 130/86 | HR 84 | Temp 98.2°F | Ht 67.0 in | Wt 236.4 lb

## 2017-05-24 DIAGNOSIS — I890 Lymphedema, not elsewhere classified: Secondary | ICD-10-CM

## 2017-05-24 DIAGNOSIS — N401 Enlarged prostate with lower urinary tract symptoms: Secondary | ICD-10-CM | POA: Diagnosis not present

## 2017-05-24 DIAGNOSIS — R351 Nocturia: Secondary | ICD-10-CM

## 2017-05-24 MED ORDER — TAMSULOSIN HCL 0.4 MG PO CAPS
0.4000 mg | ORAL_CAPSULE | Freq: Every day | ORAL | 3 refills | Status: DC
Start: 2017-05-24 — End: 2017-08-07

## 2017-05-24 NOTE — Assessment & Plan Note (Signed)
Got on pneumatic compression stockings through [facility] Before got in these had wrapped velcro wraps to help get him to get into compressoin stockings. I signed for these today- hoping insurance will cover- these were a necessary step to help him improve.  Patient thrilled with improvements in edema

## 2017-05-24 NOTE — Patient Instructions (Addendum)
Try flomax at night. Can cause lightheadedness with standing so please be very very careful and keep cane close.   Hoping this helps- if not you may discontinue  Hope we can help with 2 insurance issues- let us know if you havent heard about the PT within a week or two

## 2017-05-24 NOTE — Progress Notes (Signed)
Subjective:  Nicholas [name] is a 71 y.o. year old very pleasant male patient who presents for/with See problem oriented charting ROS- continued balance issues. No chest pain or shortness of breath. Edema improved with pneumatic compression   Past Medical History-  Patient Active Problem List   Diagnosis Date Noted  . Lymphedema 02/10/2017    Priority: High  . 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 History of malignant melanoma. left leg 03/09/2009    Priority: High  . BPH associated with nocturia 05/16/2016    Priority: Medium  . OSA (obstructive sleep apnea) 12/28/2015    Priority: Medium  . Diastolic dysfunction 40/98/1191    Priority: Medium  . Chronic venous insufficiency 02/20/2014    Priority: Medium  . Hyperglycemia 08/02/2012    Priority: Medium  . Hyperlipidemia 12/13/2006    Priority: Medium  . Essential hypertension 12/13/2006    Priority: Medium  . Senile purpura ([name]) 05/03/2016    Priority: Low  . Hypersomnia 11/15/2015    Priority: Low  . Former smoker 04/29/2014    Priority: Low  . Obesity 07/13/2010    Priority: Low  . Insomnia 10/03/2007    Priority: Low  . Cough 11/15/2015  . Dizziness 12/30/2014    Medications- reviewed and updated Current Outpatient Prescriptions  Medication Sig Dispense Refill  . carbidopa-levodopa (SINEMET IR) 25-100 MG tablet 1.5 tablets at 6am/10am/3pm and 1 tablet at 7pm 495 tablet 1  . diphenhydrAMINE (BENADRYL) 25 mg capsule Take 25 mg by mouth at bedtime.    . rosuvastatin (CRESTOR) 10 MG tablet TAKE 1 TABLET ONCE DAILY. 90 tablet 1   Objective: BP 130/86 (BP Location: Left Arm, Patient Position: Sitting, Cuff Size: Large)   Pulse 84   Temp 98.2 F (36.8 C) (Oral)   Ht 5\' 7"  (1.702 m)   Wt 236 lb 6.4 oz (107.2 kg)   SpO2 98%   BMI 37.03 kg/m  Gen: NAD, resting comfortably CV: RRR no murmurs rubs or gallops Lungs: CTAB no crackles, wheeze, rhonchi Abdomen:  soft/nontender/nondistended/normal bowel sounds. No rebound or guarding.  Ext: improved edema now at 1-2+ Skin: warm, dry Neuro: walks without assistive devie Assessment/Plan:  Parkinson's plus syndrome [name]) Patient frustrated over medical bills in relation to his [name] and PT Billed for PT services In august but was not billed in June or July- unclear why now getting 1300 bill when needs this for his parkinsons.  I discussed this with office manager and front office manager and they will investigate and start appearl if needed  Lymphedema Got on pneumatic compression stockings through [facility] Before got in these had wrapped velcro wraps to help get him to get into compressoin stockings. I signed for these today- hoping insurance will cover- these were a necessary step to help him improve.  Patient thrilled with improvements in edema  BPH associated with nocturia S: in July we opted to trial myrbetriq for potential OAB symptoms but did think many of his symptoms were related to BPH (flomax could cause orthostasis so avoided). Was having freezing episode with prakinsons getting to bathroom 1-2x a night and at times had incontinence. In day was peeing every 2 hours and had dribbling issues  Symptoms for 6 months. Urine culture negative 02/09/17 no growth and UA ok- high specific gravity Lab Results  Component Value Date   PSA 0.58 08/25/2013   PSA 0.48 04/24/2012   A/P:Declines rectal and PSA. We discussed prostate cancer risk.  He would like to trial flomax even after counseling about lightheadedness. rx printed so he can price check as insurance does not appear to cover   Future Appointments Date Time Provider [name]  08/07/2017 3:00 PM [name], [name], DO [facility] None  12/18/2017 1:15 PM Delton Prairie, OT [facility]  12/18/2017 1:30 PM [name], PT [facility]  12/18/2017 1:45 PM Schinke, [place], CCC-SLP [facility]   No planned  follow up scheduled  Meds ordered this encounter  Medications  . tamsulosin (FLOMAX) 0.4 MG CAPS capsule    Sig: Take 1 capsule (0.4 mg total) by mouth daily.    Dispense:  30 capsule    Refill:  3    Return precautions advised.  [name], MD

## 2017-05-24 NOTE — Assessment & Plan Note (Signed)
S: in July we opted to trial myrbetriq for potential OAB symptoms but did think many of his symptoms were related to BPH (flomax could cause orthostasis so avoided). Was having freezing episode with prakinsons getting to bathroom 1-2x a night and at times had incontinence. In day was peeing every 2 hours and had dribbling issues  Symptoms for 6 months. Urine culture negative 02/09/17 no growth and UA ok- high specific gravity Lab Results  Component Value Date   PSA 0.58 08/25/2013   PSA 0.48 04/24/2012   A/P:Declines rectal and PSA. We discussed prostate cancer risk. He would like to trial flomax even after counseling about lightheadedness. rx printed so he can price check as insurance does not appear to cover

## 2017-05-24 NOTE — Assessment & Plan Note (Signed)
Patient frustrated over medical bills in relation to his [name] and PT Billed for PT services In august but was not billed in June or July- unclear why now getting 1300 bill when needs this for his parkinsons.  I discussed this with office manager and front office manager and they will investigate and start appearl if needed

## 2017-07-27 ENCOUNTER — Other Ambulatory Visit: Payer: Self-pay | Admitting: Family Medicine

## 2017-07-31 DIAGNOSIS — I890 Lymphedema, not elsewhere classified: Secondary | ICD-10-CM | POA: Diagnosis not present

## 2017-08-03 DIAGNOSIS — G4733 Obstructive sleep apnea (adult) (pediatric): Secondary | ICD-10-CM | POA: Diagnosis not present

## 2017-08-03 NOTE — Progress Notes (Signed)
Nicholas [name] was seen today in the movement disorders clinic for neurologic consultation at the request of [name].  The consultation is for the evaluation of [name] disease.  I have reviewed records both from [name] as welll as from [name].  The patient is accompanied by his wife who supplements the history.  The patient first presented to [name] in June, 2016 with lightheadedness and dizziness.  He reported that that symptom had been present since 2015.  [name] noted that the patient looked parkinsonian.  In September, 2016 she started him on ropinirole, 0.25 mg twice a day but he stopped the medication after only a few days because of worsening of dizziness.  He saw Dr. Nicki Reaper in December, 2016 for a second opinion and Dr. Nicki Reaper felt that he had idiopathic Parkinson's disease and started him on levodopa.  He last saw Dr. Nicki Reaper in February, 2016 and he increased the patient's carbidopa/levodopa 25/100-1-1/2 tablets in the morning, one tablet in the afternoon and one tablet in the evening.  The patient is still on this dosage.  The patient had an MRI of the brain that I had the opportunity to review.  This was done without gadolinium in October, 2016.  There were only a few scattered T2 hyperintensities and it was otherwise unremarkable.  07/31/16 update:  The patient follows up today.  He is on carbidopa/levodopa 25/100, 1-1/2 tablets in the morning, one tablet in the afternoon and one in the evening.  Pt denies falls.  Admits to lightheadedness but no near syncope.  No hallucinations.  Mood has been good.  No longer in [employer] moves classes but in [place] biking classes (monday/thurs).    11/28/16 update:  Patient seen today in follow-up.  Patients levodopa was increased last visit to carbidopa/levodopa 25/100, 1-1/2 tablets 3 times per day.  He states that made him dizzy.  Pt denies falls. No hallucinations.  Mood has been good.  Went to [facility] for his lymphedema and just  increased his mm Hg on his lymphedema.  Saw Dr. Nicki Reaper since our last visit as well.  Told him to increase levodopa to 1.5/1.5/1.  Going to [facility] cycle class 2 days a week.  Starting PT on 5/24.    04/03/17 update:  Pt f/u today for PD, accompanied by his wife, who supplements the history.  The records that were made available to me were reviewed since last visit.  On carbidopa/levodopa 25/100, 1.5 tablets at 6am/1.5 tabletsat 12:00pm/1 tablet at 5pm.  "I need drugs, girl."  More freezing but doesn't know if med responsive.  Been to PT/OT/ST since last visit both for PD as well as for his lymphedema.  Wearing off:  Doesn't know  How long before next dose:  ? Falls:   Yes.    Exercising: mon/thurs [employer] class N/V:  Yes.   (AM nausea upon awakening before meds but goes away) Hallucinations:  No.  visual distortions: No. Lightheaded:  Yes.    Syncope: No. Dyskinesia:  No.   08/07/17 update: Patient is seen today in follow-up.  He is accompanied by his wife who supplements the history.  The patient is on carbidopa/levodopa 25/100, 1-1/2 tablets 4 times per day.  This was increased from 3 times per day last visit.  He doesn't know if it helped but he thinks that it did. Doing [employer] spin class 2 days per week. He is drinking water but "not enough."  He may not  be drinking 8 oz per day.  He states that he has a bladder issue - tried flomax but caused dizziness.  Tried myrbetriq but no helping.   Pt denies falls.  Pt has some lightheadedness, but no near syncope.  No hallucinations.  Mood has been good.   PREVIOUS MEDICATIONS: Sinemet and requip (only few days and was dizzy and was d/c)  ALLERGIES:   Allergies  Allergen Reactions  . Amoxicillin Swelling    Presumed angioedema of lips due to prednisone    CURRENT MEDICATIONS:  Outpatient Encounter Medications as of 08/07/2017  Medication Sig  . carbidopa-levodopa (SINEMET IR) 25-100 MG tablet 1.5 tablets at 6am/10am/3pm and 1 tablet at 7pm    . diphenhydrAMINE (BENADRYL) 25 mg capsule Take 25 mg by mouth at bedtime.  . rosuvastatin (CRESTOR) 10 MG tablet TAKE 1 TABLET ONCE DAILY.  . [DISCONTINUED] tamsulosin (FLOMAX) 0.4 MG CAPS capsule Take 1 capsule (0.4 mg total) by mouth daily.   No facility-administered encounter medications on file as of 08/07/2017.     PAST MEDICAL HISTORY:   Past Medical History:  Diagnosis Date  . Cancer [facility]) Jan 2008   skin; hx of melanoma left foot/ amputation of toes 1&2   . Hyperlipidemia   . Hypertension   . Lymphedema     PAST SURGICAL HISTORY:   Past Surgical History:  Procedure Laterality Date  . 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 removal of melanoma of left foot/amputation of toes 1&25 Jul 2006  . WRIST FRACTURE SURGERY     72 years old-set    SOCIAL HISTORY:   Social History   Socioeconomic History  . Marital status: Married    Spouse name: Not on file  . Number of children: Not on file  . Years of education: Not on file  . Highest education level: Not on file  Social Needs  . Financial resource strain: Not on file  . Food insecurity - worry: Not on file  . Food insecurity - inability: Not on file  . Transportation needs - medical: Not on file  . Transportation needs - non-medical: Not on file  Occupational History  . Occupation: retired    Comment: Engineer, maintenance (IT)  Tobacco Use  . Smoking status: Former Smoker    Packs/day: 1.00    Years: 16.00    Pack years: 16.00    Types: Cigarettes    Last attempt to quit: 12/22/1993    Years since quitting: 23.6  . Smokeless tobacco: Never Used  Substance and Sexual Activity  . Alcohol use: No    Alcohol/week: 12.6 oz    Types: 21 Standard drinks or equivalent per week    Comment: one time every few months now  . Drug use: No  . Sexual activity: Not on file  Other Topics Concern  . Not on file  Social History Narrative   Married 1981. Wife has kids-1 with 1 adopted grandchild.       Retired Tax adviser      Highest level  of education:  B.S.      Hobbies: antiques, former Air cabin crew      Exercise: none currently.     FAMILY HISTORY:   Family Status  Relation Name Status  . Father  Deceased at age 45  . Mother  Deceased at age 34  . Sister  Alive  . Brother  Alive    ROS:  A complete 10 system review of systems was obtained and was unremarkable apart  from what is mentioned above.  PHYSICAL EXAMINATION:    VITALS:   Vitals:   08/07/17 1506  BP: 130/84  Pulse: 84  SpO2: 93%  Weight: 240 lb (108.9 kg)  Height: 5\' 7"  (1.702 m)    GEN:  The patient appears stated age and is in NAD. HEENT:  Normocephalic, atraumatic.  The mucous membranes are moist. The superficial temporal arteries are without ropiness or tenderness. CV:  RRR Lungs:  CTAB Neck/HEME:  There are no carotid bruits bilaterally. Skin:  LE edema is significant but chronic  Neurological examination:  Orientation: The patient is alert and oriented x3.  Cranial nerves: There is good facial symmetry. There is facial hypomimia.  Extraocular muscles are intact. The visual fields are full to confrontational testing. The speech is fluent and clear. Soft palate rises symmetrically and there is no tongue deviation. Hearing is intact to conversational tone. Sensation: Sensation is intact to light touch throughout Motor: Strength is 5/5 in the bilateral upper and lower extremities.   Shoulder shrug is equal and symmetric.  There is no pronator drift.  Movement examination: Tone: There is mild to mod increased tone in the RUE (time for medication) Abnormal movements: There is none Coordination:  There is improved rapid alternating movements today.  He has some trouble with toe taps bilaterally. Gait and Station: The patient has no difficulty arising out of a deep-seated chair without the use of the hands.   He has mild start hesitation.  He stops in the doorway and freezing slightly and then is able to take a big step and walk fairly normally down  the hall.  ASSESSMENT/PLAN:  1.  Idiopathic Parkinson's disease but I do think that PSP could be on the Ddx as he does have a fixed face stare/eyes.    -We discussed the diagnosis as well as pathophysiology of the disease.  We discussed treatment options as well as prognostic indicators.  Patient education was provided.  -he will continue carbidopa/levodopa 1.5 tablets at 6am/10am/3pm and 1 tablet at 7pm.  He thought that he needed a refill but doesn't look like he should be ready.     Risks, benefits, side effects and alternative therapies were discussed.  The opportunity to ask questions was given and they were answered to the best of my ability.  The patient expressed understanding and willingness to follow the outlined treatment protocols.  -would like to see him on a walker instead of with the cane. 2.  History of melanoma  -I had a long discussion with the patient regarding the fact that we used to believe that levodopa increased once risk for melanoma, but it is now believed that patients who have Parkinson's disease have an increased risk for melanoma.  He should continue to get his yearly skin checks. Reports he is due for recheck in the summer, 2019 3.  Vasomotor rhinorrhea  -mild and he is not ready for meds 4.  HTN  -now off meds and feels that he is doing well. Following with Dr. Yong Channel and his notes reviewed. 5.  Urinary incontinence  -having continued trouble.  May need a referral to urology.  He is going to talk to Dr. Yong Channel about this.  If he gets referred, I told him to make sure the urologist knows that he has Parkinson's. 6.  Follow up is anticipated in the next few months, sooner should new neurologic issues arise.  Much greater than 50% of this visit was spent in counseling and coordinating  care.  Total face to face time:  25 min

## 2017-08-07 ENCOUNTER — Encounter: Payer: Self-pay | Admitting: Neurology

## 2017-08-07 ENCOUNTER — Ambulatory Visit (INDEPENDENT_AMBULATORY_CARE_PROVIDER_SITE_OTHER): Payer: PPO | Admitting: Neurology

## 2017-08-07 VITALS — BP 130/84 | HR 84 | Ht 67.0 in | Wt 240.0 lb

## 2017-08-07 DIAGNOSIS — G232 Striatonigral degeneration: Secondary | ICD-10-CM

## 2017-08-07 NOTE — Patient Instructions (Signed)
Parkinson's Caregiver Group   Save the Date: First meeting  will be on August 20, 2017  from 2-3:30  Parkinson's disease can be challenging for caregivers too. Changing  abilities and assuming new roles within the family can cause emotional  upheaval. Meeting with a group of peers who are experiencing similar changes is very beneficial for any caregiver. This professionally led support group will provide a safe place for Parkinson's caregivers to connect, share challenges, seek solutions, learn about resources and strengthen coping skills.    When:  Last Monday of the month (unless date falls on a holiday)  2019 Dates: 1/28, 2/25, 3/25, 4/29, 5/20, 6/24, 7/29, 8/26, 9/30, 10/28, 11/25, 12/30   Location: [place]                                                                                              [place], [place]                                                                                      Room 204 Time: 2-3:30   Please call Myra Gianotti, MSW, LCSW at [phone] with any questions.

## 2017-08-09 ENCOUNTER — Other Ambulatory Visit: Payer: Self-pay | Admitting: Neurology

## 2017-09-12 ENCOUNTER — Encounter: Payer: Self-pay | Admitting: Sports Medicine

## 2017-09-12 ENCOUNTER — Encounter: Payer: Self-pay | Admitting: Family Medicine

## 2017-09-12 ENCOUNTER — Ambulatory Visit (INDEPENDENT_AMBULATORY_CARE_PROVIDER_SITE_OTHER): Payer: PPO

## 2017-09-12 ENCOUNTER — Ambulatory Visit (INDEPENDENT_AMBULATORY_CARE_PROVIDER_SITE_OTHER): Payer: PPO | Admitting: Family Medicine

## 2017-09-12 ENCOUNTER — Ambulatory Visit (INDEPENDENT_AMBULATORY_CARE_PROVIDER_SITE_OTHER): Payer: PPO | Admitting: Sports Medicine

## 2017-09-12 ENCOUNTER — Ambulatory Visit: Payer: Self-pay

## 2017-09-12 VITALS — BP 127/85 | HR 76 | Temp 98.6°F | Ht 67.0 in | Wt 241.6 lb

## 2017-09-12 VITALS — BP 127/85 | HR 76 | Ht 67.0 in | Wt 241.6 lb

## 2017-09-12 DIAGNOSIS — N401 Enlarged prostate with lower urinary tract symptoms: Secondary | ICD-10-CM | POA: Diagnosis not present

## 2017-09-12 DIAGNOSIS — S46211A Strain of muscle, fascia and tendon of other parts of biceps, right arm, initial encounter: Secondary | ICD-10-CM | POA: Insufficient documentation

## 2017-09-12 DIAGNOSIS — Z85820 Personal history of malignant melanoma of skin: Secondary | ICD-10-CM

## 2017-09-12 DIAGNOSIS — R3589 Other polyuria: Secondary | ICD-10-CM

## 2017-09-12 DIAGNOSIS — G232 Striatonigral degeneration: Secondary | ICD-10-CM | POA: Diagnosis not present

## 2017-09-12 DIAGNOSIS — R358 Other polyuria: Secondary | ICD-10-CM | POA: Diagnosis not present

## 2017-09-12 DIAGNOSIS — M79601 Pain in right arm: Secondary | ICD-10-CM

## 2017-09-12 DIAGNOSIS — R351 Nocturia: Secondary | ICD-10-CM

## 2017-09-12 DIAGNOSIS — S46211S Strain of muscle, fascia and tendon of other parts of biceps, right arm, sequela: Secondary | ICD-10-CM | POA: Diagnosis not present

## 2017-09-12 DIAGNOSIS — M79621 Pain in right upper arm: Secondary | ICD-10-CM | POA: Diagnosis not present

## 2017-09-12 MED ORDER — NITROGLYCERIN 0.2 MG/HR TD PT24: MEDICATED_PATCH | TRANSDERMAL | 1 refills | Status: DC

## 2017-09-12 NOTE — Progress Notes (Signed)
Subjective:  Nicholas [name] is a 72 y.o. year old very pleasant male patient who presents for/with See problem oriented charting ROS- has some right arm pain. Gait instability uses cane. No chest pain or shortness of breath. Stable edema.    Past Medical History-  Patient Active Problem List   Diagnosis Date Noted  . Lymphedema 02/10/2017    Priority: High  . 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 History of malignant melanoma. left leg 03/09/2009    Priority: High  . BPH associated with nocturia 05/16/2016    Priority: Medium  . OSA (obstructive sleep apnea) 12/28/2015    Priority: Medium  . Diastolic dysfunction 34/74/2595    Priority: Medium  . Chronic venous insufficiency 02/20/2014    Priority: Medium  . Hyperglycemia 08/02/2012    Priority: Medium  . Hyperlipidemia 12/13/2006    Priority: Medium  . Essential hypertension 12/13/2006    Priority: Medium  . Senile purpura ([place]) 05/03/2016    Priority: Low  . Hypersomnia 11/15/2015    Priority: Low  . Former smoker 04/29/2014    Priority: Low  . Obesity 07/13/2010    Priority: Low  . Insomnia 10/03/2007    Priority: Low  . Strain of right biceps 09/12/2017  . Cough 11/15/2015  . Dizziness 12/30/2014    Medications- reviewed and updated Current Outpatient Medications  Medication Sig Dispense Refill  . carbidopa-levodopa (SINEMET IR) 25-100 MG tablet 1.5 tablets at 6am/10am/3pm and 1 tablet at 7pm 495 tablet 1  . diphenhydrAMINE (BENADRYL) 25 mg capsule Take 25 mg by mouth at bedtime.    . rosuvastatin (CRESTOR) 10 MG tablet TAKE 1 TABLET ONCE DAILY. 30 tablet 5  . nitroGLYCERIN (NITRODUR - DOSED IN MG/24 HR) 0.2 mg/hr patch Place 1/4 to 1/2 of a patch over affected region. Remove and replace once daily.  Slightly alter skin placement daily 30 patch 1   No current facility-administered medications for this visit.     Objective: BP 127/85 (BP Location: Left Arm, Patient Position: Sitting,  Cuff Size: Large)   Pulse 76   Temp 98.6 F (37 C) (Oral)   Ht 5\' 7"  (1.702 m)   Wt 241 lb 9.6 oz (109.6 kg)   SpO2 97%   BMI 37.84 kg/m  Gen: NAD, resting comfortably CV: RRR no murmurs rubs or gallops Lungs: CTAB no crackles, wheeze, rhonchi Abdomen: soft/nontender/nondistended/normal bowel sounds.  Ext: 3+ edema bilateral Skin: warm, dry Msk: some pain with palpation distal biceps  Assessment/Plan:  Right arm pain S: right arm discomfort- lower arm. advil helps. Sometimes hard to lift things. ove 2 years of pain Wife wants him to try CBD oil. They want my opinion A/P: I told them evidence on CBD oil not available for me to give evidence based opinion at this point. Given 2 years of pain- will refer to Dr. Paulla Fore- could simply be related to rigidity from parkinsons  BPH associated with nocturia S: flomax didn't help and made him dizzy. myrbetriq didn't help.  His main complaint is urinary frequency. Urine culture was negative for UTI July 2018. He has declined recent rectal exams. He discussed urology referral with Dr. Carles Collet A/P: [facility] for [facility] expert opinion   Future Appointments  Date Time Provider [place]  10/24/2017 11:20 AM Gerda Diss, DO [facility]  12/18/2017  1:15 PM Rine, Selmer Dominion, OT [facility]  12/18/2017  1:30 PM [name], PT [facility]  12/18/2017  1:45 PM Sharen Counter, CCC-SLP [facility]  01/08/2018  3:30 PM [name], [name], DO [facility] None   Lab/Order associations: Polyuria - Plan: Ambulatory referral to Urology  Right arm pain - Plan: Ambulatory referral to Sports Medicine  Return precautions advised.  [name], MD

## 2017-09-12 NOTE — Patient Instructions (Addendum)
We will call you within a week or two about your referral to urology. If you do not hear within 3 weeks, give [place] a call.   Thanks for seeing Dr. Paulla Fore today  Blood pressure looks good

## 2017-09-12 NOTE — Patient Instructions (Addendum)
Nitroglycerin Protocol   Apply 1/4 nitroglycerin patch to affected area daily.  Change position of patch within the affected area every 24 hours.  You may experience a headache during the first 1-2 weeks of using the patch, these should subside.  If you experience headaches after beginning nitroglycerin patch treatment, you may take your preferred over the counter pain reliever.  Another side effect of the nitroglycerin patch is skin irritation or rash related to patch adhesive.  Please notify our office if you develop more severe headaches or rash, and stop the patch.  Tendon healing with nitroglycerin patch may require 12 to 24 weeks depending on the extent of injury.  Men should not use if taking Viagra, Cialis, or Levitra.   Do not use if you have migraines or rosacea.    Please perform the exercise program that we have prepared for you and gone over in detail on a daily basis.  In addition to the handout you were provided you can access your program through: [email or web address]   Your unique program code is: [email or web address]

## 2017-09-12 NOTE — Procedures (Signed)
LIMITED MSK ULTRASOUND OF right upper arm Images were obtained and interpreted by myself, Teresa Coombs, DO  Images have been saved and stored to PACS system. Images obtained on: GE S7 Ultrasound machine  FINDINGS:   Biceps musculature is a slight irregularity within the long head fibers within the distal third portion consistent with a chronic nonhealing strain.  Minimal neovascularity  No significant osseous irregularities appreciated.  IMPRESSION:  1. Chronic nonhealing long head of biceps strain

## 2017-09-12 NOTE — Procedures (Signed)

## 2017-09-12 NOTE — Assessment & Plan Note (Signed)
This is likely a chronic issue for him secondary to the rigidity from his Parkinson's.  We will have him begin working on gentle strengthening at evening of the tendon to see if this can diminish his symptoms as well as begin nitroglycerin protocol.   Discussed using compression as well and information regarding body helix "ARM (BICEPS/TRICEPS) compression sleeve provided  Therapeutic exercises per procedure note

## 2017-09-12 NOTE — Assessment & Plan Note (Signed)
S: flomax didn't help and made him dizzy. myrbetriq didn't help.  His main complaint is urinary frequency. Urine culture was negative for UTI July 2018. He has declined recent rectal exams. He discussed urology referral with Dr. Carles Collet A/P: [facility] for their expert opinion

## 2017-09-12 NOTE — Assessment & Plan Note (Signed)
Spent extensive time discussing the risks and benefits of nitroglycerin versus exercise only for chronic muscle and tendon issues.  Cautioned on the increased risk of orthostasis and possible syncope if his symptoms are profound.  He will be cautious as he adapts to the medication and discontinue medication if severe symptoms.

## 2017-09-12 NOTE — Progress Notes (Signed)
Nicholas [name], Nicholas [name] - 72 y.o. male MRN [id number]  Date of birth: 06-13-1946  Visit Date: 09/12/2017  PCP: [name], MD   Referred by: [name], MD   Scribe for today's visit: Josepha Pigg, CMA     SUBJECTIVE:  Nicholas [name] is here for New Patient (Initial Visit) (R arm pain) .  Referred by: Dr. [name] His R arm pain symptoms INITIALLY: Began a couple of years ago and he was advised that its related to Parkinson's disease.  Described as severe aching, at times just a dull ache, nonradiating Worsened in the morning after sleeping on the arm.  Improved with rest, not using it.  Additional associated symptoms include: Pain is located near the bicep. He has been noticing increased weakness in the arm. He denies neck pain.     At this time symptoms are worsening compared to onset, pain is more severe and has increased in frequency.  He has tried taking Advil with some relief but doesn't like to take regularly d/t GI issues.    ROS Denies night time disturbances. Denies fevers, chills, or night sweats. Denies unexplained weight loss. Reports personal history of cancer, malignant melanoma L leg. Denies changes in bowel or bladder habits. Denies recent unreported falls. Denies new or worsening dyspnea or wheezing. Denies headaches or dizziness.  Reports weakness in the extremities.  Denies dizziness or presyncopal episodes Reports lower extremity edema     HISTORY & PERTINENT PRIOR DATA:  Prior History reviewed and updated per electronic medical record.  Significant history, findings, studies and interim changes include:  reports that he quit smoking about 23 years ago. His smoking use included cigarettes. He has a 16.00 pack-year smoking history. he has never used smokeless tobacco. Recent Labs    02/09/17 1154  HGBA1C 5.6   No specialty comments  available. Problem  Strain of Right Biceps  Parkinson's Plus Syndrome ([facility])    OBJECTIVE:  VS:  HT:5\' 7"  (170.2 cm)   WT:241 lb 9.6 oz (109.6 kg)  BMI:37.83    BP:127/85  HR:76bpm  TEMP: ( )  RESP:    PHYSICAL EXAM: Constitutional: WDWN, Non-toxic appearing. Psychiatric: Alert & appropriately interactive.  Not depressed or anxious appearing. Respiratory: No increased work of breathing.  Trachea Midline Eyes: Pupils are equal.  EOM intact without nystagmus.  No scleral icterus  NEUROVASCULAR exam: No clubbing or cyanosis appreciated No significant venous stasis changes Capillary Refill: normal, less than 2 seconds   His right arm is overall normal-appearing without significant nodularity.  He has no upper extremity swelling.  He does have a focal amount of pain within the distal third of the lateral portion of the biceps.  He has full flexion extension of the elbow and shoulder however has rigidity and slight cogwheeling. No pain with internal rotation or external rotation of the shoulder. supination, pronation strength and range of motion pain-free and normal.  Most focal pain is with speeds test and O'Brien's testing localizing to the distal to the biceps.  Negative Hawkins and Neer's  No additional findings.   ASSESSMENT & PLAN:   1. History of malignant melanoma. left leg   2. 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 Right arm pain   4. 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  Cautioned on the increased risk of orthostasis and possible syncope if his symptoms are profound.  He will be cautious as he adapts to the medication and discontinue medication if severe symptoms.  Strain of right biceps This is likely a chronic issue for him secondary to the rigidity from his Parkinson's.  We will have him begin  working on gentle strengthening at evening of the tendon to see if this can diminish his symptoms as well as begin nitroglycerin protocol.   Discussed using compression as well and information regarding body helix "ARM (BICEPS/TRICEPS) compression sleeve provided  Therapeutic exercises per procedure note    ++++++++++++++++++++++++++++++++++++++++++++ Orders & Meds: Orders Placed This Encounter  Procedures  . DG Humerus Right  . [place] MSK POCT ULTRASOUND    Meds ordered this encounter  Medications  . nitroGLYCERIN (NITRODUR - DOSED IN MG/24 HR) 0.2 mg/hr patch    Sig: Place 1/4 to 1/2 of a patch over affected region. Remove and replace once daily.  Slightly alter skin placement daily    Dispense:  30 patch    Refill:  1    For musculoskeletal purposes.  Okay to cut patch.    ++++++++++++++++++++++++++++++++++++++++++++ Follow-up: Return in about 6 weeks (around 10/24/2017).   Pertinent documentation may be included in additional procedure notes, imaging studies, problem based documentation and patient instructions. Please see these sections of the encounter for additional information regarding this visit. CMA/ATC served as Education administrator during this visit. History, Physical, and Plan performed by medical provider. Documentation and orders reviewed and attested to.      Gerda Diss, [place] [facility] Physician

## 2017-10-02 DIAGNOSIS — G20 Parkinson's disease: Secondary | ICD-10-CM | POA: Diagnosis not present

## 2017-10-17 DIAGNOSIS — N400 Enlarged prostate without lower urinary tract symptoms: Secondary | ICD-10-CM | POA: Diagnosis not present

## 2017-10-24 ENCOUNTER — Ambulatory Visit (INDEPENDENT_AMBULATORY_CARE_PROVIDER_SITE_OTHER): Payer: PPO | Admitting: Sports Medicine

## 2017-10-24 ENCOUNTER — Encounter: Payer: Self-pay | Admitting: Sports Medicine

## 2017-10-24 VITALS — BP 140/84 | HR 94 | Ht 67.0 in | Wt 243.6 lb

## 2017-10-24 DIAGNOSIS — S46211S Strain of muscle, fascia and tendon of other parts of biceps, right arm, sequela: Secondary | ICD-10-CM | POA: Diagnosis not present

## 2017-10-24 DIAGNOSIS — G232 Striatonigral degeneration: Secondary | ICD-10-CM

## 2017-10-24 DIAGNOSIS — M79601 Pain in right arm: Secondary | ICD-10-CM

## 2017-10-24 NOTE — Progress Notes (Signed)
Nicholas [name]. Nicholas [name], [place] [facility] [place] - 72 y.o. male MRN [id number]  Date of birth: 24-Feb-1946  Visit Date: 10/24/2017  PCP: [name], MD   Referred by: [name], MD  Scribe for today's visit: Josepha Pigg, CMA     SUBJECTIVE:  Nicholas [name] is here for Follow-up (R arm pain)  09/12/2017: His R arm pain symptoms INITIALLY: Began a couple of years ago and he was advised that its related to Parkinson's disease.  Described as severe aching, at times just a dull ache, nonradiating Worsened in the morning after sleeping on the arm.  Improved with rest, not using it.  Additional associated symptoms include: Pain is located near the bicep. He has been noticing increased weakness in the arm. He denies neck pain.    At this time symptoms are worsening compared to onset, pain is more severe and has increased in frequency.  He has tried taking Advil with some relief but doesn't like to take regularly d/t GI issues.   10/24/2017: Compared to the last office visit, his previously described symptoms are improving. He still has some pain first thing in the morning but as he gets up and moving the pain improves.   Current symptoms are mild & are nonradiating He has been using CBD oil with some relief. He tried following Nitro Protocol but the patches made him dizzy so it stopped using them for a week, tried again, and felt dizzy again.   ROS Denies night time disturbances. Denies fevers, chills, or night sweats. Denies unexplained weight loss. Denies personal history of cancer. Denies changes in bowel or bladder habits. Denies recent unreported falls. Denies new or worsening dyspnea or wheezing. Denies headaches or dizziness.  Denies numbness, tingling or weakness  In the extremities.  Denies dizziness or presyncopal episodes Reports lower extremity edema    HISTORY & PERTINENT PRIOR DATA:    Prior History reviewed and updated per electronic medical record.  Significant/pertinent history, findings, studies include:  reports that he quit smoking about 23 years ago. His smoking use included cigarettes. He has a 16.00 pack-year smoking history. He has never used smokeless tobacco. Recent Labs    02/09/17 1154  HGBA1C 5.6   No specialty comments available. No problems updated.  OBJECTIVE:  VS:  HT:5\' 7"  (170.2 cm)   WT:243 lb 9.6 oz (110.5 kg)  BMI:38.14    BP:140/84  HR:94bpm  TEMP: ( )  RESP:96 %   PHYSICAL EXAM: Constitutional: WDWN, Non-toxic appearing. Psychiatric: Alert & appropriately interactive.  Not depressed or anxious appearing. Respiratory: No increased work of breathing.  Trachea Midline Eyes: Pupils are equal.  EOM intact without nystagmus.  No scleral icterus  Vascular Exam: warm to touch no edema  upper extremity neuro exam: unremarkable  MSK Exam: Shoulders overall doing significantly better.  He has improved overhead range of motion.  He has minimal pain with flexion extension but still has persistent cogwheeling and difficulty with this.   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 Right arm pain   3. Strain of right biceps, sequela     PLAN: Overall is doing significantly better.  Biceps tendon strain is improved.  His parkinsonism is obviously a major issue and he will continue with therapy for this.  Happy see him back anytime for any ongoing issues.  Follow-up: Return for as needed for ongoing issues.  Please see additional documentation for Objective, Assessment and Plan sections. Pertinent additional documentation may be included in corresponding procedure notes, imaging studies, problem based documentation and patient instructions. Please see these sections of the encounter for additional information regarding this visit.  CMA/ATC served as Education administrator during this visit. History, Physical, and Plan performed by  medical provider. Documentation and orders reviewed and attested to.      Gerda Diss, [facility] Physician

## 2017-11-06 DIAGNOSIS — N3941 Urge incontinence: Secondary | ICD-10-CM | POA: Diagnosis not present

## 2017-11-22 DIAGNOSIS — R350 Frequency of micturition: Secondary | ICD-10-CM | POA: Diagnosis not present

## 2017-11-22 DIAGNOSIS — R3915 Urgency of urination: Secondary | ICD-10-CM | POA: Diagnosis not present

## 2017-11-22 DIAGNOSIS — N401 Enlarged prostate with lower urinary tract symptoms: Secondary | ICD-10-CM | POA: Diagnosis not present

## 2017-12-18 ENCOUNTER — Ambulatory Visit: Payer: PPO | Admitting: Occupational Therapy

## 2017-12-18 ENCOUNTER — Ambulatory Visit: Payer: PPO | Attending: Family Medicine

## 2017-12-18 ENCOUNTER — Ambulatory Visit: Payer: PPO | Admitting: Physical Therapy

## 2017-12-18 DIAGNOSIS — R278 Other lack of coordination: Secondary | ICD-10-CM

## 2017-12-18 DIAGNOSIS — R41841 Cognitive communication deficit: Secondary | ICD-10-CM | POA: Insufficient documentation

## 2017-12-18 DIAGNOSIS — R471 Dysarthria and anarthria: Secondary | ICD-10-CM | POA: Insufficient documentation

## 2017-12-18 NOTE — Therapy (Signed)
[facility] [address] [place], [place], [address] Phone: [phone]   Fax:  [phone]  Patient Details  Name: Nicholas [name] MRN: [id number] Date of Birth: 17-Aug-1945 Referring Provider:  Alonza Bogus, DO  Encounter Date: 12/18/2017  Speech Therapy Parkinson's Disease Screen   Pt reports, "I wake up and some days I just can't talk at all. I just shut up (due to not being heard)."  Decibel Level today: mid-upper 60s dB  (WNL=70-72 dB) with sound level meter 30cm away from pt's mouth. Pt's conversational volume has decreased since d/c from last treatment course in late August 2018.  Pt continues to have a rare occurrence with pharyngeal clearance. SLP may need to monitor this during his next course of therapy.  Pt would benefit from speech-language eval for dysarthria- please order via Epic.   [facility] ,[place], [place]  12/18/2017, 1:43 PM  [facility] [address] [place], [place], [address] Phone: [phone]   Fax:  [phone]

## 2017-12-18 NOTE — Therapy (Addendum)
[facility] [address] [place], [place], [address] Phone: [phone]   Fax:  [phone]  Patient Details  Name: Nicholas [name] MRN: [id number] Date of Birth: October 24, 1945 Referring Provider:  [name], MD  Encounter Date: 12/18/2017 Occupational Therapy Parkinson's Disease Screen  9-hole peg test:    RUE  31.59 sec        LUE  32.75 sec  Change in ability to perform ADLs/IADLs:  Yes increased freezing with mobility and increased time for ADLs with  2 recent falls impacting pt safety for ADLs/IADLS.(Pt demonstrated significant freezing during eval, therapist used U-step walker with pt to walk to PT screen. Pt reports a significant decline in mobility.)  Other Comments:  Handwriting is legible, with mild micrographia  Pt would benefit from occupational therapy evaluation due to  Maximize safety And I with ADLs  Kayvan Hoefling 12/18/2017, 1:24 PM  [facility] [address] [place], [place], [address] Phone: [phone]   Fax:  [phone]

## 2017-12-21 ENCOUNTER — Telehealth: Payer: Self-pay | Admitting: Neurology

## 2017-12-21 DIAGNOSIS — G232 Striatonigral degeneration: Secondary | ICD-10-CM

## 2017-12-21 DIAGNOSIS — G20C Parkinsonism, unspecified: Secondary | ICD-10-CM

## 2017-12-21 NOTE — Telephone Encounter (Signed)
Order entered

## 2017-12-21 NOTE — Telephone Encounter (Signed)
-----   Message from Sharen Counter, [place] sent at 12/21/2017  2:13 PM EDT ----- Dr. Toney Rakes- Romona Curls Murad attended our multi-d parkinson's disease screenings on Thursday 12-20-17. PT, OT, and ST are recommending evaluations due to decr'd mobility and balance, decr'd abilities in functional ADLs, and decr'd speech volume, respectively.  If agreed, please order via Epic. Thank you.  Garald Balding, SLP

## 2018-01-07 NOTE — Progress Notes (Signed)
Nicholas [name] was seen today in the movement disorders clinic for neurologic consultation at the request of [name].  The consultation is for the evaluation of [name] disease.  I have reviewed records both from [name] as welll as from [name].  The patient is accompanied by his wife who supplements the history.  The patient first presented to [name] in June, 2016 with lightheadedness and dizziness.  He reported that that symptom had been present since 2015.  [name] noted that the patient looked parkinsonian.  In September, 2016 she started him on ropinirole, 0.25 mg twice a day but he stopped the medication after only a few days because of worsening of dizziness.  He saw Dr. Nicki Reaper in December, 2016 for a second opinion and Dr. Nicki Reaper felt that he had idiopathic Parkinson's disease and started him on levodopa.  He last saw Dr. Nicki Reaper in February, 2016 and he increased the patient's carbidopa/levodopa 25/100-1-1/2 tablets in the morning, one tablet in the afternoon and one tablet in the evening.  The patient is still on this dosage.  The patient had an MRI of the brain that I had the opportunity to review.  This was done without gadolinium in October, 2016.  There were only a few scattered T2 hyperintensities and it was otherwise unremarkable.  07/31/16 update:  The patient follows up today.  He is on carbidopa/levodopa 25/100, 1-1/2 tablets in the morning, one tablet in the afternoon and one in the evening.  Pt denies falls.  Admits to lightheadedness but no near syncope.  No hallucinations.  Mood has been good.  No longer in [employer] moves classes but in [place] biking classes (monday/thurs).    11/28/16 update:  Patient seen today in follow-up.  Patients levodopa was increased last visit to carbidopa/levodopa 25/100, 1-1/2 tablets 3 times per day.  He states that made him dizzy.  Pt denies falls. No hallucinations.  Mood has been good.  Went to [facility] for his lymphedema and just  increased his mm Hg on his lymphedema.  Saw Dr. Nicki Reaper since our last visit as well.  Told him to increase levodopa to 1.5/1.5/1.  Going to [facility] cycle class 2 days a week.  Starting PT on 5/24.    04/03/17 update:  Pt f/u today for PD, accompanied by his wife, who supplements the history.  The records that were made available to me were reviewed since last visit.  On carbidopa/levodopa 25/100, 1.5 tablets at 6am/1.5 tabletsat 12:00pm/1 tablet at 5pm.  "I need drugs, girl."  More freezing but doesn't know if med responsive.  Been to PT/OT/ST since last visit both for PD as well as for his lymphedema.  Wearing off:  Doesn't know  How long before next dose:  ? Falls:   Yes.    Exercising: mon/thurs [employer] class N/V:  Yes.   (AM nausea upon awakening before meds but goes away) Hallucinations:  No.  visual distortions: No. Lightheaded:  Yes.    Syncope: No. Dyskinesia:  No.   08/07/17 update: Patient is seen today in follow-up.  He is accompanied by his wife who supplements the history.  The patient is on carbidopa/levodopa 25/100, 1-1/2 tablets 4 times per day.  This was increased from 3 times per day last visit.  He doesn't know if it helped but he thinks that it did. Doing [employer] spin class 2 days per week. He is drinking water but "not enough."  He may not  be drinking 8 oz per day.  He states that he has a bladder issue - tried flomax but caused dizziness.  Tried myrbetriq but no helping.   Pt denies falls.  Pt has some lightheadedness, but no near syncope.  No hallucinations.  Mood has been good.  01/08/18 update: Patient is seen today in follow-up for Parkinson's.  He is accompanied by his wife who supplements the history.  He is on carbidopa/levodopa 25/100, 1-1/2 tablets 3 times per day and an additional tablet in evening. Records have been reviewed since our last visit.  The patient went to [facility] on October [facility], 2017.  He saw the physician assistant.  No changes were made to his medication  regimen and he was told to follow-up in 6 months.  He saw urology since our last visit and they started him on myrbetriq.  He too it for 5 days but had dizziness and d/c it.  "I quit taking it and now I wear depends at night."  Patient just went for his therapy screens at the end of May.  It was determined that he did need therapy and orders were sent.  He just started that today.  He is doing spin class at [facility].  Has a walker that he just got 3 weeks ago.     PREVIOUS MEDICATIONS: Sinemet and requip (only few days and was dizzy and was d/c)  ALLERGIES:   Allergies  Allergen Reactions  . Amoxicillin Swelling    Presumed angioedema of lips due to prednisone    CURRENT MEDICATIONS:  Outpatient Encounter Medications as of 01/08/2018  Medication Sig  . carbidopa-levodopa (SINEMET IR) 25-100 MG tablet 2 tablets at 7am/11am/3pm and 1 tablet at 7pm  . diphenhydrAMINE (BENADRYL) 25 mg capsule Take 25 mg by mouth at bedtime.  . rosuvastatin (CRESTOR) 10 MG tablet TAKE 1 TABLET ONCE DAILY.  . [DISCONTINUED] carbidopa-levodopa (SINEMET IR) 25-100 MG tablet 1.5 tablets at 6am/10am/3pm and 1 tablet at 7pm   No facility-administered encounter medications on file as of 01/08/2018.     PAST MEDICAL HISTORY:   Past Medical History:  Diagnosis Date  . 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 Hyperlipidemia   . Hypertension   . Lymphedema     PAST SURGICAL HISTORY:   Past Surgical History:  Procedure Laterality Date  . 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 removal of melanoma of left foot/amputation of toes 1&25 Jul 2006  . WRIST FRACTURE SURGERY     72 years old-set    SOCIAL HISTORY:   Social History   Socioeconomic History  . Marital status: Married    Spouse name: Not on file  . Number of children: Not on file  . Years of education: Not on file  . Highest education level: Not on file  Occupational History  . Occupation: retired    Comment:  Engineer, maintenance (IT)  Social Needs  . Financial resource strain: Not on file  . Food insecurity:    Worry: Not on file    Inability: Not on file  . Transportation needs:    Medical: Not on file    Non-medical: Not on file  Tobacco Use  . Smoking status: Former Smoker    Packs/day: 1.00    Years: 16.00    Pack years: 16.00    Types: Cigarettes    Last attempt to quit: 12/22/1993    Years since quitting: 24.0  .  Smokeless tobacco: Never Used  Substance and Sexual Activity  . Alcohol use: No    Alcohol/week: 12.6 oz    Types: 21 Standard drinks or equivalent per week    Comment: one time every few months now  . Drug use: No  . Sexual activity: Not on file  Lifestyle  . Physical activity:    Days per week: Not on file    Minutes per session: Not on file  . Stress: Not on file  Relationships  . Social connections:    Talks on phone: Not on file    Gets together: Not on file    Attends religious service: Not on file    Active member of club or organization: Not on file    Attends meetings of clubs or organizations: Not on file    Relationship status: Not on file  . Intimate partner violence:    Fear of current or ex partner: Not on file    Emotionally abused: Not on file    Physically abused: Not on file    Forced sexual activity: Not on file  Other Topics Concern  . Not on file  Social History Narrative   Married 1981. Wife has kids-1 with 1 adopted grandchild.       Retired Tax adviser      Highest level of education:  B.S.      Hobbies: antiques, former Air cabin crew      Exercise: none currently.     FAMILY HISTORY:   Family Status  Relation Name Status  . Father  Deceased at age 39  . Mother  Deceased at age 42  . Sister  Alive  . Brother  Alive    ROS:  Review of Systems  Constitutional: Negative.   HENT: Negative.   Eyes: Negative.   Respiratory: Negative.   Cardiovascular: Negative.   Gastrointestinal: Negative.   Genitourinary:       Nighttime incontinence     Musculoskeletal: Negative.   Skin: Negative.   Neurological:       Freezing in crowds/doorways      PHYSICAL EXAMINATION:    VITALS:   Vitals:   01/08/18 1518  BP: (!) 144/86  Pulse: (!) 104  SpO2: 94%  Weight: 236 lb (107 kg)  Height: 5\' 7"  (1.702 m)    GEN:  The patient appears stated age and is in NAD. HEENT:  Normocephalic, atraumatic.  The mucous membranes are moist. The superficial temporal arteries are without ropiness or tenderness. CV:  Tachycardia.  regular Lungs:  CTAB Neck/HEME:  There are no carotid bruits bilaterally. Skin:  LE edema is significant but chronic.  Pt wearing compression stocking  Neurological examination:  Orientation: The patient is alert and oriented x3. Cranial nerves: There is good facial symmetry. The speech is fluent and clear. Soft palate rises symmetrically and there is no tongue deviation. Hearing is intact to conversational tone. Sensation: Sensation is intact to light touch throughout Motor: Strength is 5/5 in the bilateral upper and lower extremities.   Shoulder shrug is equal and symmetric.  There is no pronator drift.  Movement examination: Tone: There is mild increased tone in the RUE Abnormal movements: none Coordination:  There is mild decremation with RAM's, primarily seen with toe taps Gait and Station: The patient has difficulty arising out of a deep-seated chair without the use of the hands.  He pushes off of the chair.  He then grabs the walker and has fairly significant start hesitation.  He has mild  freezing in the doorway, but once on the hallway he actually does very well.  He did well in the turn today.  Once he got back to the doorway he again had some start hesitation.    ASSESSMENT/PLAN:  1.  Idiopathic Parkinson's disease but I do think that PSP could be on the Ddx as he does have a fixed face stare/eyes.    -We discussed the diagnosis as well as pathophysiology of the disease.  We discussed treatment options as  well as prognostic indicators.  Patient education was provided.  -Slightly increase carbidopa/levodopa 25/100, 2/2/2/1  -Just got a walker and encouraged him to use that at all times.  -Wife asked me about Inbrija.  I really do not think that will be particularly useful for him.  His biggest issue is freezing in doorways, crowded spaces and start hesitation.  I do not think Inbrija is going to help him with this.  -asks about several "alternative" treatment and we discussed them.  Overall, I did not recommend any of them. 2.  History of melanoma  -last dermatology visit was supposed to be today but he cx and is scheduled for July 15 now. 3.  Vasomotor rhinorrhea  -mild and he is not ready for meds 4.  HTN  -now off meds and feels that he is doing well. Following with Dr. Yong Channel and his notes reviewed. 5.  Urinary incontinence  -having continued trouble.  Encouraged him to follow back up with urology.  He had side effects with Myrbetriq. 6.  Follow up is anticipated in the next few months, sooner should new neurologic issues arise.  Much greater than 50% of this visit was spent in counseling and coordinating care.  Total face to face time:  25 min

## 2018-01-08 ENCOUNTER — Ambulatory Visit: Payer: PPO | Attending: Family Medicine | Admitting: Physical Therapy

## 2018-01-08 ENCOUNTER — Ambulatory Visit (INDEPENDENT_AMBULATORY_CARE_PROVIDER_SITE_OTHER): Payer: PPO | Admitting: Neurology

## 2018-01-08 ENCOUNTER — Encounter: Payer: Self-pay | Admitting: Neurology

## 2018-01-08 ENCOUNTER — Encounter: Payer: Self-pay | Admitting: Physical Therapy

## 2018-01-08 ENCOUNTER — Encounter: Payer: Self-pay | Admitting: Occupational Therapy

## 2018-01-08 ENCOUNTER — Ambulatory Visit: Payer: PPO | Admitting: Occupational Therapy

## 2018-01-08 VITALS — BP 144/86 | HR 104 | Ht 67.0 in | Wt 236.0 lb

## 2018-01-08 DIAGNOSIS — Z85820 Personal history of malignant melanoma of skin: Secondary | ICD-10-CM

## 2018-01-08 DIAGNOSIS — R32 Unspecified urinary incontinence: Secondary | ICD-10-CM

## 2018-01-08 DIAGNOSIS — G232 Striatonigral degeneration: Secondary | ICD-10-CM

## 2018-01-08 DIAGNOSIS — R29898 Other symptoms and signs involving the musculoskeletal system: Secondary | ICD-10-CM | POA: Diagnosis not present

## 2018-01-08 DIAGNOSIS — R278 Other lack of coordination: Secondary | ICD-10-CM | POA: Insufficient documentation

## 2018-01-08 DIAGNOSIS — R2689 Other abnormalities of gait and mobility: Secondary | ICD-10-CM | POA: Diagnosis not present

## 2018-01-08 DIAGNOSIS — R2681 Unsteadiness on feet: Secondary | ICD-10-CM

## 2018-01-08 DIAGNOSIS — R29818 Other symptoms and signs involving the nervous system: Secondary | ICD-10-CM | POA: Insufficient documentation

## 2018-01-08 DIAGNOSIS — M6281 Muscle weakness (generalized): Secondary | ICD-10-CM

## 2018-01-08 MED ORDER — CARBIDOPA-LEVODOPA 25-100 MG PO TABS: ORAL_TABLET | ORAL | 1 refills | Status: DC

## 2018-01-08 NOTE — Patient Instructions (Signed)
Increase carbidopa/levodopa 25/100 to 2 tablets at 7 AM, 2 tablets at 11am, 2 tablets at 3pm, and 1 tablet at [facility] Exercise Programs:   PWR! Moves PD Exercise Class:  This is a therapist-led exercise class for people with Parkinson's disease in the [place] community. It consists of a one-hour exercise class each week. Classes are offered in eight-week sessions, and the cost per session is $80. Class size is limited to a maximum of 20 participants. Participant criteria includes: Participant must be able to get up and down from the floor with minimal to no assistance, have had 0-1 falls in the past 6 months, and have completed physical or occupational therapy at [facility] within the past year.  To find out more about session dates, questions, or to register, please contact Nicholas [name], Physical Therapist, or Nicholas [name], Physical Brewing technologist, at [name] at [phone].  PWR! Circuit Class:  This is a therapist-led exercise class with intervals of circuit activities incorporating PWR! Moves into functional activities. It consists of one 45-minute exercise class per week. Classes are offered in eight-week sessions, and the cost per session is $120. Class size is limited to a maximum of eight participants to allow for hands-on instruction. Participant criteria: class is ideal for people with Parkinson's disease who have completed PWR! Moves Exercise Class or who are currently independently exercising and want to be challenged, must be able to walk independently with 0-1 falls in the past 6 months, able to get up and down from the floor independently, able to sit to stand independently, and able to jog 20 feet.   To find out more about session dates, questions, or to register, please contact Nicholas [name], Physical Therapist, or Nicholas [name], Physical  Brewing technologist, at [facility] at [phone].   [employer] Parkinson's Cycle:   Parkinson's Cycle Class at [facility] This is an ongoing class on Monday and Thursday mornings at 10:45 a.m. A healthcare provider referral is required to enroll. This class is [name] to participants, and you do not have to be a member of the [employer] to enroll. Contact Nicholas [name] at [phone] or [name]. Parkinson's Cycle Class at [facility] Ongoing Class Monday, Wednesday, and Friday mornings at 9:00 a.m. A healthcare provider referral is required to enroll. This class is [name] to participants, and you do not have to be a member of the [employer] to enroll. Contact Nicholas [name] at [phone] or [name]. Parkinson's Cycle Class at [facility] Ongoing Class every Friday mornings at 12 p.m.  A healthcare provider referral is required to enroll. This class is [name] to participants, and you do not have to be a member of the [employer] to enroll. Contact [phone].  Parkinson's Cycle Class at [facility] Ongoing Class every Monday at 12pm.  A healthcare provider referral is required to enroll. This class is [name] to participants, and you do not have to be a member of the [employer] to enroll. Contact Nicholas [name] at [phone] or  [email or web address].   [employer]:  [employer]  Classes are offered Mondays at 5:15 p.m. and Tuesdays and Thursdays at 12 p.m. at [employer]. For more information, contact [phone] or visit [email or web address] or [email or web address]. [employer] [place] Classes are offered Monday, Wednesday, and Friday from 9:30 a.m. - 11:00 a.m. For more information, contact [phone] or [phone] or email [email or web address] or visit  [email or web address] or [email or web address]. [employer] (classes are  offered at 2 locations) . Nicholas [name] in [place] (for more information, contact Nicholas [name] at [phone] or email Nicholas [name] . Nicholas [name] at [facility] (class is open to the public -- for more information, contact Nicholas [name] at [phone] or email Nicholas [name]) [place] are held at [facility] in [place], [place]. For more information, call Nicholas [name] at [phone] or [email or web address].   Personal Training for Parkinson's:   [employer] Offers certified personal training to customize a program to meet your exercise needs to address Parkinson's disease. For more information, contact [phone] or visit [email or web address].  Community Dance for Parkinson's:   Community dance class for people with Parkinson's Disease Wednesdays at 9 a.m. [employer] [place], [place] [address] Please contact Nicholas [name] [phone] for more information  Scholarships Available for Fitness Programs:  The [place] for [employer] is a non-profit 501(C)3 organization run by volunteers, whose mission is to strive to empower those living with Parkinson's Disease (PD), Progressive Supra-Nuclear Palsy (PSP) and Multiple System Atrophy ([place]).  Through financial support, recipients benefit from individual and group programs. [phone] Nicholas [name]  Powering Together for Parkinson's & Movement Disorders  The [facility] Parkinson's and Movement Disorders team know that living well with a movement disorder extends far beyond our clinic walls. We are together with you. Our team is passionate about providing resources to you and your loved ones who are living with Parkinson's disease and movement disorders. Participate in these programs and join our community. These resources are [name] or low cost!   [place] Parkinson's and Movement Disorders Program is adding:    Innovative educational programs for patients and caregivers.   Support groups for patients and caregivers living with Parkinson's disease.   Parkinson's specific exercise programs.   Custom tailored therapeutic programs that will benefit patient's living with Parkinson's disease.   We are in this together. You can help and contribute to grow these programs and resources in our community. 100% of the funds donated to the [place] stays right here in our community to support patients and their caregivers.  To make a tax deductible contribution:  -ask for a Power Together for Parkinson's envelope in the office today.  - call the Office of Institutional Advancement at [phone].

## 2018-01-09 NOTE — Therapy (Signed)
[facility] [address] [place], [place], [address] Phone: [phone]   Fax:  [phone]  Physical Therapy Evaluation  Patient Details  Name: Nicholas [name] MRN: [id number] Date of Birth: [birth year] Referring Provider: [name]   Encounter Date: 01/08/2018  PT End of Session - 01/09/18 0906    Visit Number  1    Number of Visits  17    Date for PT Re-Evaluation  04/08/18    Authorization Type  [employer] (Medicare)-will need 10th visit progress note    Authorization Time Period  cert 5/73/22-0/25/42    PT Start Time  1102    PT Stop Time  1147    PT Time Calculation (min)  45 min    Equipment Utilized During Treatment  Gait belt    Activity Tolerance  Patient tolerated treatment well    Behavior During Therapy  [facility] for tasks assessed/performed       Past Medical History:  Diagnosis Date  . 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 Hyperlipidemia   . Hypertension   . Lymphedema     Past Surgical History:  Procedure Laterality Date  . 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 removal of melanoma of left foot/amputation of toes 1&25 Jul 2006  . WRIST FRACTURE SURGERY     72 years old-set    There were no vitals filed for this visit.   Subjective Assessment - 01/08/18 1109    Subjective  "Not moving worth a darn."  Not moving as well since I was here last.  Not sure if it's my Parkinson's or lymphedema.  Had 2 falls in [place] my foot and tripped over rug because I was going too fast.  Got rollator a month ago.    Pertinent History  Lymphedema-has compression machine and uses stockings (wife helps); hx of PD/Parkinson's Plus Syndrone; malignant melanoma, hyperlipidemia, HTN, insomnia, obesity, hyperglycemia, dizziness, OSA, urinary urgency    Patient Stated Goals  Pt's goals for therapy are to "be like I used to be."    Currently in Pain?  No/denies          [facility] PT Assessment - 01/08/18 1120      Assessment   Medical Diagnosis  Parkinson's disease    Referring Provider  [name], Jackie Plum    Onset Date/Surgical Date  12/18/17 PD screen    Prior Therapy  02/2017-neuro PT      Precautions   Precautions  Fall      Balance Screen   Has the patient fallen in the past 6 months  Yes    How many times?  2    Has the patient had a decrease in activity level because of a fear of falling?   No Just need to take more time    Is the patient reluctant to leave their home because of a fear of falling?   No      Home Environment   Living Environment  Private residence    Living Arrangements  Spouse/significant other    Available Help at Discharge  Family    Type of [place] to enter    Entrance Stairs-Number of Steps  3    Entrance Stairs-Rails  None having rail built; uses cane    Home Layout  Two level;Able to live on main  level with bedroom/bathroom    [place] - single point;Walker - 4 wheels Grab bars being installed      Prior Function   Level of Independence  Independent    Vocation  Part time employment Works out of home    Leisure  Spin class 2x/wk      Observation/Other Assessments   Focus on Therapeutic Outcomes (FOTO)   NA      Posture/Postural Control   Posture/Postural Control  Postural limitations    Postural Limitations  Rounded Shoulders;Forward head      ROM / Strength   AROM / PROM / Strength  Strength      Strength   Overall Strength  Deficits    Overall Strength Comments  Grossly tested:  3+/5 hip flexion, 3-/5 quads, 3/5 knee flexion; 3/5 bilateral dorsiflexion      Flexibility   Soft Tissue Assessment /Muscle Length  yes lymphedema present bilateral lower extremities      Transfers   Transfers  Sit to Stand;Stand to Sit    Sit to Stand  5: Supervision;Without upper extremity assist;From chair/3-in-1 posterior pull 4th and 5th rep    Five time sit to stand  comments   10.56    Stand to Sit  5: Supervision;Without upper extremity assist;To chair/3-in-1      Ambulation/Gait   Ambulation/Gait  Yes    Ambulation/Gait Assistance  5: Supervision;4: Min guard    Ambulation/Gait Assistance Details  Gait festination/freezing noted with initiation of gait, with doorway negotiation and turns    Ambulation Distance (Feet)  150 Feet    Assistive device  Rollator    Gait Pattern  Step-through pattern;Decreased step length - right;Decreased step length - left;Decreased dorsiflexion - right;Decreased dorsiflexion - left;Poor foot clearance - left;Poor foot clearance - right;Festinating    Ambulation Surface  Level;Indoor    Gait velocity  14.66 sec = 2.24 ft/sec       Standardized Balance Assessment   Standardized Balance Assessment  Timed Up and Go Test      Timed Up and Go Test   Normal TUG (seconds)  64.81    TUG Comments  Scores >13.5 sec indicate increased fall risk; >30 sec indicates difficulty with ADLs in home; (previous PT d/c TUG measure was 18.06 sec).  Pt experiences significant festination of gait with turning around and turning to sit.                Objective measurements completed on examination: See above findings.           Self Care:   PT Education - 01/09/18 0905    Education Details  Initial review of tips to reduce festinating episodes:  weightshifting, stopping to reset posture to take big first step; educated in fall risk/comparison of PT measures since last PT d/c 02/2017, need to use rollator at all times.  Also educated in benefit of bedside commode due to urinary urge/frequency and freezing with gait issues with nighttime urination    Person(s) Educated  Patient    Methods  Explanation    Comprehension  Verbalized understanding       PT Short Term Goals - 01/09/18 0915      PT SHORT TERM GOAL #1   Title  Pt will be independent with HEP for improved balance, transfers and gait.  TARGET 02/08/18    Time  4     Period  Weeks    Status  New    Target Date  02/08/18      PT SHORT TERM GOAL #2   Title  Pt will improve TUG score to less than or equal to 50 seconds for decreased fall risk.    Time  4    Period  Weeks    Status  New    Target Date  02/08/18      PT SHORT TERM GOAL #3   Title  Pt wil perform at least 8 of 10 reps of sit<>stand transfers from 18 inch surfaces, with no posterior lean, independently for improved transfer efficiency and safety.    Time  4    Period  Weeks    Status  New    Target Date  02/08/18      PT SHORT TERM GOAL #4   Title  Pt will verbalize undersatnding of techniques to reduce freezing episodes with gait and turns    Time  4    Period  Weeks    Target Date  02/08/18        PT Long Term Goals - 01/09/18 0917      PT LONG TERM GOAL #1   Title  Pt will verbalize understanding of fall prevention within the home environment.  TARGET 03/08/18    Time  8    Period  Weeks    Status  New    Target Date  03/08/18      PT LONG TERM GOAL #2   Title  Pt will improve TUG score to less than or equal to 35 seconds for decreased fall risk.    Time  8    Period  Weeks    Status  New    Target Date  03/08/18      PT LONG TERM GOAL #3   Title  Pt will improve gait velocity with conversation to at least 2.62 ft/sec for improved gait efficiency and safety.    Time  8    Period  Weeks    Status  New    Target Date  03/08/18      PT LONG TERM GOAL #4   Title  Pt will verbalize plans for continued community fitness upon d/c from PT.    Time  8    Status  New    Target Date  03/08/18             Plan - 01/09/18 0909    Clinical Impression Statement  PT is a 72 year old male with history Parkinson's Plus syndrome (PD/PSP diagnosis).  He is known to this therapist from previous bout of PD-specific therapy ending 02/2017.  Since that time, he has had 2 falls and his mobility measures have slowed (gait velocity 2.24 t/sec compared to 2.6 ft/sec and TUG  64.81 sec compared to 18.06 sec).  He presents to OP PT with significant festination/freezing of gait, which occurs with turns, doorways, narrow spaces, obstacles; decreased balance, decreased functional strength with transfers, decreased muscle strength.  He would benefit from skilled PT to address the above stated deficits to decrease fall risk and improve functional mobility.    History and Personal Factors relevant to plan of care:  PMH>3 co-morbidities, hx of 2 falls in past 6 months, recent diagnosis of possible PSP    Clinical Presentation  Evolving    Clinical Presentation due to:  significant decline in functional measures since last bout of PT ending in 02/2017, progressive neurological disease    Clinical Decision Making  Moderate    Rehab  Potential  Good    Clinical Impairments Affecting Rehab Potential  for PT goals/tx plan    PT Frequency  2x / week    PT Duration  8 weeks plus eval    PT Treatment/Interventions  ADLs/Self Care Home Management;DME Instruction;Gait training;Functional mobility training;Therapeutic activities;Therapeutic exercise;Balance training;Patient/family education;Neuromuscular re-education    PT Next Visit Plan  Review tips to reduce freezing with gait; education on safety with use of rollator; PWR! Moves in sitting or standing; activities to improve foot clearance with gait    Consulted and Agree with Plan of Care  Patient       Patient will benefit from skilled therapeutic intervention in order to improve the following deficits and impairments:  Abnormal gait, Decreased balance, Decreased mobility, Difficulty walking, Decreased safety awareness, Decreased strength, Postural dysfunction  Visit Diagnosis: Other abnormalities of gait and mobility  Unsteadiness on feet  Other symptoms and signs involving the nervous system  Muscle weakness (generalized)     Problem List Patient Active Problem List   Diagnosis Date Noted  . Strain of right biceps  09/12/2017  . Lymphedema 02/10/2017  . BPH associated with nocturia 05/16/2016  . Senile purpura ([place]) 05/03/2016  . OSA (obstructive sleep apnea) 12/28/2015  . Hypersomnia 11/15/2015  . Cough 11/15/2015  . 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 Dizziness 12/30/2014  . Diastolic dysfunction 65/99/3570  . Former smoker 04/29/2014  . Chronic venous insufficiency 02/20/2014  . Hyperglycemia 08/02/2012  . Obesity 07/13/2010  . History of malignant melanoma. left leg 03/09/2009  . Insomnia 10/03/2007  . Hyperlipidemia 12/13/2006  . Essential hypertension 12/13/2006    Jaycie Kregel W. 01/09/2018, 9:21 AM [name]., PT  [facility] [address] [place], [place], [address] Phone: [phone]   Fax:  [phone]  Name: SPYRIDON HORNSTEIN MRN: [id number] Date of Birth: [birth year]

## 2018-01-09 NOTE — Therapy (Signed)
[facility] [address] [place], [place], [address] Phone: [phone]   Fax:  [phone]  Occupational Therapy Evaluation  Patient Details  Name: Nicholas [name] MRN: [id number] Date of Birth: 1946-01-16 Referring Provider: [name]   Encounter Date: 01/08/2018  OT End of Session - 01/08/18 1515    Visit Number  1    Number of Visits  17    Date for OT Re-Evaluation  03/09/18    Authorization Type  [employer] (Medicare)    Authorization Time Period  cert.  01/08/18-04/08/18    OT Start Time  1150    OT Stop Time  1235    OT Time Calculation (min)  45 min    Activity Tolerance  Patient tolerated treatment well    Behavior During Therapy  WFL for tasks assessed/performed       Past Medical History:  Diagnosis Date  . 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 Hyperlipidemia   . Hypertension   . Lymphedema     Past Surgical History:  Procedure Laterality Date  . 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 removal of melanoma of left foot/amputation of toes 1&25 Jul 2006  . WRIST FRACTURE SURGERY     72 years old-set    There were no vitals filed for this visit.  Subjective Assessment - 01/08/18 1154    Subjective   I'm slower.  I'm on a downhill slope with my Parkinson's    Pertinent History  hx of PD/Parkinson's Plus Syndrone; malignant melanoma, hyperlipidemia, HTN, insomnia, obesity, hyperglycemia, dizziness, OSA, urinary urgency, lymphedema    Patient Stated Goals  improve handwriting    Currently in Pain?  No/denies        [facility] OT Assessment - 01/08/18 1154      Assessment   Medical Diagnosis  Parkinson's Plus Syndrome     Referring Provider  [name]    Onset Date/Surgical Date  12/18/17 PD Screen    Hand Dominance  Right    Prior Therapy  02/2017 last OT      Precautions   Precautions  Fall      Balance Screen   Has the patient fallen  in the past 6 months  Yes    How many times?  2 tripping over rug, no injuries      Home  Environment   Family/patient expects to be discharged to:  Private residence    Lives With  Spouse has dog      Prior Function   Level of Independence  Independent    Vocation  Part time Doctor, hospital consults    Leisure  Spin class 2x/wk      ADL   Eating/Feeding  -- spills with eating, incr difficulty opening    Grooming  Modified independent    Upper Body Bathing  Modified independent    Lower Body Bathing  -- mod I with decr safety    Upper Body Dressing  Increased time difficulty with buttons    Lower Body Dressing  Minimal assistance wife assist with compression hose, pants    Toilet Transfer  Modified independent    Toileting - Clothing Manipulation  Modified independent    Where Assessed - Toileting Clothing Manipulationn  -- difficulty with urinary urgency     Tub/Shower Transfer  Modified independent Geophysicist/field seismologist  Grab bars;Walk in shower;Shower seat with back bars installed Wednesday, freezing, not using seat    Transfers/Ambulation Related to ADL's  difficulty getting in/out of car      IADL   Prior Level of Function Shopping  pt does not perform    Prior Level of Function Light Housekeeping  pt does not perform    Prior Level of Function Meal Prep  used to grill, does not any longer due to steps and carrying food    Investment banker, corporate own vehicle    Medication Management  Is responsible for taking medication in correct dosages at correct time    Psychiatrist financial matters independently (budgets, writes checks, pays rent, bills goes to bank), collects and keeps track of income      Mobility   Mobility Status  Freezing;History of falls    Mobility Status Comments  Ambulates with 4 wheeled RW in the home and community (1 at home and 1 in community).  Freezing around coffee table, doorways, tight spaces.      Written  Expression   Dominant Hand  Right    Handwriting  90% legible;Mild micrographia print, mod-severe micrographia cursive, 50% legibility      Vision - History   Baseline Vision  Wears contact    Additional Comments  WFL per pt report, Not formally assessed      Cognition   Overall Cognitive Status  Impaired/Different from baseline to be assessed further in functional context prn    Bradyphrenia  Yes per pt      Observation/Other Assessments   Other Surveys   Select    Physical Performance Test    Yes    Simulated Eating Time (seconds)  13.47    Simulated Eating Comments  holds spoon at end    Donning Doffing Jacket Time (seconds)  19.68 with no trunk rotation    Donning Doffing Jacket Comments  Fastening/unfastening 3 buttons in 47.37sec      Posture/Postural Control   Posture/Postural Control  Postural limitations    Postural Limitations  Rounded Shoulders;Forward head      Coordination   9 Hole Peg Test  Right;Left    Right 9 Hole Peg Test  33.69    Left 9 Hole Peg Test  30.65    Box and Blocks  R-45blocks, L-40blocks LUE affected by hypokinesia      Tone   Assessment Location  Right Upper Extremity;Left Upper Extremity      ROM / Strength   AROM / PROM / Strength  AROM      AROM   Overall AROM   Within functional limits for tasks performed      RUE Tone   RUE Tone  -- mild-mod       LUE Tone   LUE Tone  -- mild-mod                      OT Education - 01/08/18 2354    Education Details  Safety considerations for bathing (use of seat for LE bathing, recommended number of grab bars); recommended urinal for nighttime urgency and freezing when attempting to go to bathroom; PD etiology (per pt questions)    Person(s) Educated  Patient    Methods  Explanation    Comprehension  Verbalized understanding       OT Short Term Goals - 01/08/18 2358      OT SHORT TERM GOAL #1   Title  Pt  will be independent with updated HEP.--check STGs 02/07/18    Time  4     Period  Weeks    Status  New      OT SHORT TERM GOAL #2   Title  Pt will be able to write short paragraph and sign name with 100% legibility and only minimal decr in size use AE/strategies.    Time  4    Period  Weeks    Status  New      OT SHORT TERM GOAL #3   Title  Pt will improve coordination for ADLs as shown by fastening/unfastening 3 buttons in 43sec or less.    Baseline  47.37    Time  4    Period  Weeks    Status  New      OT SHORT TERM GOAL #4   Title  Assess standing functional reach and establish goal as appropriate.    Time  4    Period  Weeks    Status  New        OT Long Term Goals - 01/09/18 0003      OT LONG TERM GOAL #1   Title  Pt will verbalize understanding of AE/strategies to incr safety, ease, and independence with ADLs/IADLs.--check LTGs 03/10/18    Time  8    Period  Weeks    Status  New      OT LONG TERM GOAL #2   Title  Pt will improve coordination for ADLs as shown by improving time on 9-hole peg test by at least 3sec bilaterally.    Baseline  R-33.69, L-30.69sec    Time  8    Period  Weeks    Status  New      OT LONG TERM GOAL #3   Title  Pt will improve coordination for ADLs as shown by fastening/unfastening 3 buttons in 40sec or less.    Baseline  47.37    Time  8    Period  Weeks    Status  New      OT LONG TERM GOAL #4   Title  Pt will improve ability to dress as shown by improving time on PPT#4 by at least 3 sec and incorporate trunk rotation to decr risk for shoulder injury.    Baseline  19.68sec with no trunk rotation    Time  8    Period  Weeks    Status  New      OT LONG TERM GOAL #5   Title  Pt will improve bradykinesia/hypokinesia for functional reaching/coordination for ADLs as shown by improving score on box and blocks test by at least 3 with LUE.    Baseline  40 blocks    Time  8    Period  Weeks    Status  New            Plan - 01/08/18 1516    Clinical Impression Statement  Pt is a 72 y.o. male with  Parkinson's Plus Syndrome (PD/PSP).  Pt with decline in function (incr difficulty with ADLs and incr freezing).  Pt with PMH:  hx of malignant melanoma, hyperlipidemia, HTN, insomnia, obesity, hyperglycemia, dizziness, OSA, urinary urgency, lymphedema.  Pt would benefit from occupational therapy to improve safety with ADLs and prevent future complications.    Occupational Profile and client history currently impacting functional performance  Pt is working part time and performing BADLs mostly mod I-min A (was mod I)  Pt reports incr difficulty  with ADLs and decr in IADL performance over last 54months-year.      Occupational performance deficits (Please refer to evaluation for details):  ADL's;IADL's;Leisure;Work;Rest and Sleep;Social Participation    Rehab Potential  Good    OT Frequency  2x / week    OT Duration  8 weeks +eval    OT Treatment/Interventions  Self-care/ADL training;Therapeutic exercise;Visual/perceptual remediation/compensation;Patient/family education;Neuromuscular education;Moist Heat;Fluidtherapy;Energy conservation;Therapist, nutritional;Therapeutic activities;Balance training;Cognitive remediation/compensation;Passive range of motion;Manual Therapy;DME and/or AE instruction;Ultrasound;Contrast Bath;Cryotherapy    Plan  Assess Standing functional reach and establish goal as appropriate; step and reach, PWR! hands    Clinical Decision Making  Several treatment options, min-mod task modification necessary    Consulted and Agree with Plan of Care  Patient       Patient will benefit from skilled therapeutic intervention in order to improve the following deficits and impairments:  Decreased cognition, Decreased knowledge of use of DME, Impaired vision/preception, Decreased mobility, Decreased coordination, Decreased activity tolerance, Decreased range of motion, Impaired tone, Impaired UE functional use, Impaired perceived functional ability, Decreased safety awareness, Decreased  knowledge of precautions, Decreased balance(bradykinesia/hypokinesia; freezing)  Visit Diagnosis: Other symptoms and signs involving the nervous system  Other lack of coordination  Other symptoms and signs involving the musculoskeletal system  Unsteadiness on feet  Other abnormalities of gait and mobility    Problem List Patient Active Problem List   Diagnosis Date Noted  . Strain of right biceps 09/12/2017  . Lymphedema 02/10/2017  . BPH associated with nocturia 05/16/2016  . Senile purpura ([place]) 05/03/2016  . OSA (obstructive sleep apnea) 12/28/2015  . Hypersomnia 11/15/2015  . Cough 11/15/2015  . 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 Dizziness 12/30/2014  . Diastolic dysfunction 23/53/6144  . Former smoker 04/29/2014  . Chronic venous insufficiency 02/20/2014  . Hyperglycemia 08/02/2012  . Obesity 07/13/2010  . History of malignant melanoma. left leg 03/09/2009  . Insomnia 10/03/2007  . Hyperlipidemia 12/13/2006  . Essential hypertension 12/13/2006    [facility] 01/09/2018, 12:13 AM  [facility] [address] [place], [place], [address] Phone: [phone]   Fax:  [phone]  Name: DAVEON ARPINO MRN: [id number] Date of Birth: [birth year]   Vianne Bulls, OTR/L [facility] [address] [place], [place]  [address] [phone] phone [phone] 01/09/18 12:13 AM

## 2018-01-31 DIAGNOSIS — G4733 Obstructive sleep apnea (adult) (pediatric): Secondary | ICD-10-CM | POA: Diagnosis not present

## 2018-02-04 DIAGNOSIS — L821 Other seborrheic keratosis: Secondary | ICD-10-CM | POA: Diagnosis not present

## 2018-02-04 DIAGNOSIS — Z85828 Personal history of other malignant neoplasm of skin: Secondary | ICD-10-CM | POA: Diagnosis not present

## 2018-02-04 DIAGNOSIS — Z85820 Personal history of malignant melanoma of skin: Secondary | ICD-10-CM | POA: Diagnosis not present

## 2018-02-04 DIAGNOSIS — L718 Other rosacea: Secondary | ICD-10-CM | POA: Diagnosis not present

## 2018-02-04 DIAGNOSIS — D225 Melanocytic nevi of trunk: Secondary | ICD-10-CM | POA: Diagnosis not present

## 2018-02-04 DIAGNOSIS — L570 Actinic keratosis: Secondary | ICD-10-CM | POA: Diagnosis not present

## 2018-02-05 ENCOUNTER — Ambulatory Visit: Payer: PPO | Admitting: Occupational Therapy

## 2018-02-05 ENCOUNTER — Ambulatory Visit: Payer: PPO | Attending: Family Medicine | Admitting: Physical Therapy

## 2018-02-05 ENCOUNTER — Encounter: Payer: Self-pay | Admitting: Physical Therapy

## 2018-02-05 ENCOUNTER — Other Ambulatory Visit: Payer: Self-pay

## 2018-02-05 ENCOUNTER — Ambulatory Visit: Payer: PPO | Admitting: Speech Pathology

## 2018-02-05 DIAGNOSIS — R278 Other lack of coordination: Secondary | ICD-10-CM | POA: Diagnosis not present

## 2018-02-05 DIAGNOSIS — R29898 Other symptoms and signs involving the musculoskeletal system: Secondary | ICD-10-CM

## 2018-02-05 DIAGNOSIS — R1312 Dysphagia, oropharyngeal phase: Secondary | ICD-10-CM | POA: Diagnosis not present

## 2018-02-05 DIAGNOSIS — M6281 Muscle weakness (generalized): Secondary | ICD-10-CM | POA: Diagnosis not present

## 2018-02-05 DIAGNOSIS — R471 Dysarthria and anarthria: Secondary | ICD-10-CM

## 2018-02-05 DIAGNOSIS — R1310 Dysphagia, unspecified: Secondary | ICD-10-CM | POA: Diagnosis not present

## 2018-02-05 DIAGNOSIS — R2689 Other abnormalities of gait and mobility: Secondary | ICD-10-CM | POA: Insufficient documentation

## 2018-02-05 DIAGNOSIS — R41841 Cognitive communication deficit: Secondary | ICD-10-CM | POA: Insufficient documentation

## 2018-02-05 DIAGNOSIS — R29818 Other symptoms and signs involving the nervous system: Secondary | ICD-10-CM | POA: Diagnosis not present

## 2018-02-05 DIAGNOSIS — R2681 Unsteadiness on feet: Secondary | ICD-10-CM | POA: Diagnosis not present

## 2018-02-05 NOTE — Patient Instructions (Addendum)
Home exercises: 3x per day  1) Say "ah" with your loud, good quality voice (use your diaphragm). Aim for effort level 7 or 8.   5 times  2) Additional reading OUT LOUD tasks assigned by your therapist. (See handouts)    Signs of Aspiration Pneumonia   . Chest pain/tightness . Fever (can be low grade) . Cough  o With foul-smelling phlegm (sputum) o With sputum containing pus or blood o With greenish sputum . Fatigue  . Shortness of breath  . Wheezing   **IF YOU HAVE THESE SIGNS, CONTACT YOUR DOCTOR OR GO TO THE EMERGENCY DEPARTMENT OR URGENT CARE AS SOON AS POSSIBLE**

## 2018-02-05 NOTE — Therapy (Signed)
[facility] [address] [place], [place], [address] Phone: [phone]   Fax:  [phone]  Physical Therapy Treatment  Patient Details  Name: Nicholas [name] MRN: [id number] Date of Birth: 10-14-1945 Referring Provider: [name]   Encounter Date: 02/05/2018  PT End of Session - 02/05/18 2116    Visit Number  2    Number of Visits  17    Date for PT Re-Evaluation  04/08/18    Authorization Type  [employer] (Medicare)-will need 10th visit progress note    Authorization Time Period  cert 12/19/39-10/15/38    PT Start Time  1016    PT Stop Time  1058    PT Time Calculation (min)  42 min    Activity Tolerance  Patient tolerated treatment well    Behavior During Therapy  [facility] for tasks assessed/performed       Past Medical History:  Diagnosis Date  . Cancer [facility]) Jan 2008   skin; hx of melanoma left foot/ amputation of toes 1&2   . Hyperlipidemia   . Hypertension   . Lymphedema     Past Surgical History:  Procedure Laterality Date  . 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 removal of melanoma of left foot/amputation of toes 1&25 Jul 2006  . WRIST FRACTURE SURGERY     72 years old-set    There were no vitals filed for this visit.  Subjective Assessment - 02/05/18 1021    Subjective  No falls, no changes other than increase in medication from Dr. Carles Collet.    Pertinent History  Lymphedema-has compression machine and uses stockings (wife helps); hx of PD/Parkinson's Plus Syndrone; malignant melanoma, hyperlipidemia, HTN, insomnia, obesity, hyperglycemia, dizziness, OSA, urinary urgency    Patient Stated Goals  Pt's goals for therapy are to "be like I used to be."    Currently in Pain?  No/denies                       [facility] Adult PT Treatment/Exercise - 02/05/18 2108      Ambulation/Gait   Ambulation/Gait  Yes    Ambulation/Gait Assistance  5: Supervision;4: Min guard    Ambulation Distance  (Feet)  50 Feet x 3, 120 ft    Assistive device  Rollator    Gait Pattern  Step-through pattern;Decreased step length - right;Decreased step length - left;Decreased dorsiflexion - right;Decreased dorsiflexion - left;Poor foot clearance - left;Poor foot clearance - right;Festinating    Ambulation Surface  Level;Indoor    Gait Comments  Upon standing:  practiced lateral weightshifting x 10, stagger stance anterior/posterior weightshifting x 10 reps each position at walker; marching in place x 10 reps.  Practiced figure-8 turns, short distance gait with narrow-space turns (using sidestep turn technique), at least 5 reps.  Doorway negotiation, with cues for looking at visual target ahead, and cues for counting steps to get through doorway.      Self-Care   Self-Care  Other Self-Care Comments    Other Self-Care Comments   Reviewed and practiced tips to reduce freezing episodes with gait     1. Stand tall with your feet wide, so that you can rock and weight shift through your hips. 2. Don't try to fight the freeze: if you begin taking slower, faster, smaller steps, STOP, get your posture tall, and RESET your posture and balance.  Take a deep breath before taking the BIG step to start again. 3.  March in place, with high knee stepping, to get started walking again. 4. Use auditory cues:  Count out loud, think of a familiar tune or song or cadence, use pocket metronome, to use rhythm to get started walking again. 5. Use visual cues:  Use a line to step over, use laser pointer line to step over, (using BIG steps) to start walking again. 6. Use visual targets to keep your posture tall (look ahead and focus on an object or target at eye level). 7. As you approach where your destination with walking, count your steps out loud and/or focus on your target with your eyes until you are fully there. 8. Use appropriate assistive device, as advised by your physical therapist to assist with taking longer, consistent  steps.   9.  Stop ahead of problem area for freezing and plan strategy to be use to be sucessful 10.  Rock walker back and forward to initiate gait.  Reviewed POC and pt's goals:  Pt specifically says his goals are to improve turns and improve doorway negotiation.           PT Short Term Goals - 02/05/18 2120      PT SHORT TERM GOAL #1   Title  Pt will be independent with HEP for improved balance, transfers and gait.  Updated TARGET 03/01/18    Time  4    Period  Weeks    Status  On-going    Target Date  03/01/18      PT SHORT TERM GOAL #2   Title  Pt will improve TUG score to less than or equal to 50 seconds for decreased fall risk.    Time  4    Period  Weeks    Status  On-going    Target Date  03/01/18      PT SHORT TERM GOAL #3   Title  Pt wil perform at least 8 of 10 reps of sit<>stand transfers from 18 inch surfaces, with no posterior lean, independently for improved transfer efficiency and safety.    Time  4    Period  Weeks    Status  On-going    Target Date  03/01/18      PT SHORT TERM GOAL #4   Title  Pt will verbalize undersatnding of techniques to reduce freezing episodes with gait and turns    Time  4    Period  Weeks    Status  On-going    Target Date  03/01/18        PT Long Term Goals - 02/05/18 2122      PT LONG TERM GOAL #1   Title  Pt will verbalize understanding of fall prevention within the home environment.  TARGET 03/29/18    Time  8    Period  Weeks    Status  On-going    Target Date  03/29/18      PT LONG TERM GOAL #2   Title  Pt will improve TUG score to less than or equal to 35 seconds for decreased fall risk.    Time  8    Period  Weeks    Status  On-going    Target Date  03/29/18      PT LONG TERM GOAL #3   Title  Pt will improve gait velocity with conversation to at least 2.62 ft/sec for improved gait efficiency and safety.    Time  8    Period  Weeks    Status  On-going    Target Date  03/29/18      PT LONG TERM GOAL  #4   Title  Pt will verbalize plans for continued community fitness upon d/c from PT.    Time  8    Status  On-going    Target Date  03/29/18            Plan - 02/05/18 2117    Clinical Impression Statement  Pt not seen since PT eval nearly one month ago, due to patient's scheduling preferences.  Focused today's skilled PT session on review of and practice on strategies to reduce freezing episodes with gait, turns, doorways.  Pt needs repeated cues to slow pace and focus on strategies to decrease festination and freezing episodes.    Rehab Potential  Good    Clinical Impairments Affecting Rehab Potential  for PT goals/tx plan    PT Frequency  2x / week    PT Duration  8 weeks plus eval    PT Treatment/Interventions  ADLs/Self Care Home Management;DME Instruction;Gait training;Functional mobility training;Therapeutic activities;Therapeutic exercise;Balance training;Patient/family education;Neuromuscular re-education    PT Next Visit Plan  Review tips to reduce freezing with gait; education on safety with use of rollator; PWR! Moves in sitting or standing; activities to improve foot clearance with gait    Consulted and Agree with Plan of Care  Patient     Goal dates updated to reflect pt not being seen since eval.  Patient will benefit from skilled therapeutic intervention in order to improve the following deficits and impairments:  Abnormal gait, Decreased balance, Decreased mobility, Difficulty walking, Decreased safety awareness, Decreased strength, Postural dysfunction  Visit Diagnosis: Other abnormalities of gait and mobility  Unsteadiness on feet  Other symptoms and signs involving the musculoskeletal system  Other symptoms and signs involving the nervous system  Muscle weakness (generalized)     Problem List Patient Active Problem List   Diagnosis Date Noted  . Strain of right biceps 09/12/2017  . Lymphedema 02/10/2017  . BPH associated with nocturia 05/16/2016  .  Senile purpura ([place]) 05/03/2016  . OSA (obstructive sleep apnea) 12/28/2015  . Hypersomnia 11/15/2015  . Cough 11/15/2015  . 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 Dizziness 12/30/2014  . Diastolic dysfunction 17/61/6073  . Former smoker 04/29/2014  . Chronic venous insufficiency 02/20/2014  . Hyperglycemia 08/02/2012  . Obesity 07/13/2010  . History of malignant melanoma. left leg 03/09/2009  . Insomnia 10/03/2007  . Hyperlipidemia 12/13/2006  . Essential hypertension 12/13/2006    Holland Kotter W. 02/05/2018, 9:25 PM [name]., PT  [facility] [address] [place], [place], [address] Phone: [phone]   Fax:  [phone]  Name: TRIGGER FRASIER MRN: [id number] Date of Birth: 22-Apr-1946

## 2018-02-05 NOTE — Therapy (Signed)
[facility] [address] [place], [place], [address] Phone: [phone]   Fax:  [phone]  Speech Language Pathology Evaluation  Patient Details  Name: Nicholas [name] Date of Birth: November 09, 1945 Referring Provider: [name]    Encounter Date: 02/05/2018  End of Session - 02/05/18 1246    Visit Number  1    Number of Visits  17    Date for SLP Re-Evaluation  04/19/18 re-eval extended due to delayed start date    Authorization Type  1 visit if OT/PT/ST on same day    Authorization Time Period  VL: medicare guidelines    SLP Start Time  1148    SLP Stop Time   1232    SLP Time Calculation (min)  44 min    Activity Tolerance  Patient tolerated treatment well       Past Medical History:  Diagnosis Date  . 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 Hyperlipidemia   . Hypertension   . Lymphedema     Past Surgical History:  Procedure Laterality Date  . 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 removal of melanoma of left foot/amputation of toes 1&25 Jul 2006  . WRIST FRACTURE SURGERY     72 years old-set    There were no vitals filed for this visit.  Subjective Assessment - 02/05/18 1154    Subjective   "I think i'm about 72 and I ought to be about 71"    Currently in Pain?  No/denies         SLP Evaluation [facility] - 02/05/18 1154      SLP Visit Information   SLP Received On  02/05/18    Referring Provider  [name]     Onset Date  12/18/17 PD Screen     Medical Diagnosis  Parkinson's/ PSP      General Information   HPI  hx of PD/Parkinson's Plus Syndrone; malignant melanoma, hyperlipidemia, HTN, insomnia, obesity, hyperglycemia, dizziness, OSA, urinary urgency, lymphedema. Known to ST from prior courses, most recently 02/06/17-03/16/17.    Behavioral/Cognition  alert, cooperative    Mobility Status  rollator      Balance Screen   Has  the patient fallen in the past 6 months  Yes see PT eval      Prior Functional Status   Cognitive/Linguistic Baseline  Baseline deficits    Baseline deficit details  pt endorses difficulty with "sequencing" and wordfinding      Cognition   Overall Cognitive Status  History of cognitive impairments - at baseline cognition not formally assessed today      Auditory Comprehension   Overall Auditory Comprehension  Appears within functional limits for tasks assessed      Visual Recognition/Discrimination   Discrimination  Not tested      Reading Comprehension   Reading Status  Within funtional limits      Expression   Primary Mode of Expression  Verbal      Verbal Expression   Overall Verbal Expression  Impaired    Initiation  No impairment    Automatic Speech  Name;Social Response    Level of Generative/Spontaneous Verbalization  Conversation    Repetition  No impairment    Naming  Impairment    Responsive  72-100% accurate 90%    Pragmatics  No impairment      Written Expression  Dominant Hand  Right    Written Expression  Not tested      Oral Motor/Sensory Function   Overall Oral Motor/Sensory Function  Impaired    Labial ROM  Reduced right;Reduced left    Labial Symmetry  Within Functional Limits    Labial Strength  Reduced    Lingual ROM  Within Functional Limits    Lingual Symmetry  Within Functional Limits    Facial ROM  Reduced right;Reduced left    Facial Symmetry  Within Functional Limits      Motor Speech   Overall Motor Speech  Impaired    Respiration  Impaired    Level of Impairment  Sentence    Phonation  Low vocal intensity    Resonance  Within functional limits    Articulation  Within functional limitis    Intelligibility  Intelligibility reduced    Conversation  75-100% accurate >95%; SLP had to ask for repeat x2    Motor Planning  Witnin functional limits    Effective Techniques  Increased vocal intensity    Phonation  Impaired    Volume   Soft;Decibel Level spontaneous speech averages 65dB today in 5 min conversation    Pitch  Low      Standardized Assessments   Standardized Assessments   Other Assessment       Oral motor assessment revealed WNL lingual ROM. Labial ROM was reduced and strength was mildly decreased. Velar ROM appeared WNL.   Measured when a sound level meter was placed 30 cm away from pt's mouth, 5 minutes of conversational speech was reduced today, at average 65 dB (WNL= average 70-72dB) with range of 61-69 dB. Overall speech intelligibility for this listener in a quiet environment was minimally affected, at > 95%. Production of loud /a/ averaged 93 dB at 30 cm(range of 88 to 96) and occasional cues min needed for breath support.   Pt then rated effort level at 7/10 for production of loud /a/ (10=maximal effort). In phrase level task pt was asked to use the same amount of effort as with loud /a/. Loudness average with this increased effort was 71dB (range of 68 to 74) with usual min-mod A for loudness. Pt would benefit from skilled ST in order to improve speech intelligibility and pt's QOL.      SLP Education - 02/05/18 1245    Education Details  loud /a/, effort level, breath support/use of abdominals    Person(s) Educated  Patient    Methods  Explanation;Demonstration;Verbal cues;Handout    Comprehension  Verbalized understanding;Need further instruction       SLP Short Term Goals - 02/05/18 1249      SLP SHORT TERM GOAL #1   Title  pt will sustain loud /a/ at >90 dB at 30 cm over 3 consecutive sessions    Time  4    Period  Weeks    Status  New      SLP SHORT TERM GOAL #2   Title  pt will exhibit volume of 70dB in sentence responses 95% over 3 sessions    Time  4    Period  Weeks    Status  New      SLP SHORT TERM GOAL #3   Title  In 7 minutes simple conversation pt will achieve average speech volume of low 70s dB over two sessions    Time  4    Period  Weeks    Status  New  SLP  SHORT TERM GOAL #4   Title  Pt will participate in objective assessment of swallow function (MBS or FEES) if warranted.    Time  4    Period  Weeks    Status  New      SLP SHORT TERM GOAL #5   Title  Pt will complete simple-mod complex naming tasks with 90% accuracy and rare min A     Time  4    Period  Weeks    Status  New       SLP Long Term Goals - 02/05/18 1250      SLP LONG TERM GOAL #1   Title  pt will sustain loud /a/ at average >90dB at 30 cm over 5 consecutive sessions    Time  8    Period  Weeks    Status  New      SLP LONG TERM GOAL #2   Title  pt will generate speech in 10 minutes mod complex conversation with average >70dB over three sessions    Time  8    Period  Weeks    Status  New      SLP LONG TERM GOAL #3   Title  Pt will tell SLP 3 s/sx of aspiration PNA.    Time  8    Period  Weeks    Status  New      SLP LONG TERM GOAL #4   Title  Pt will utilize compensations for anomia in structured tasks with rare min A over 4 sessions.     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 Pt also reports wordfinding difficulties which have been occuring more frequently: "It's the part of this disease that bothers me the most." He reports occasional globus sensation (gestures to thyroid notch) occurring monthly with solids. He also endorses occasional coughing/choking with liquids and occasionally saliva about once a day, more frequently late at night. He has not had pneumonia, weight has been stable. Pt stimulable for increased vocal intensity in structured speech tasks with usual min-mod A (low 70s dB). I recommend skilled ST to address dysarthria, cognitive-communication and dysphagia. SLP to monitor diet tolerance;  will request MBS orders and schedule objective testing if warranted.     Speech Therapy Frequency  2x / week    Duration  -- 8 weeks or 16 additional visits    Treatment/Interventions  Aspiration precaution training;Diet toleration management by SLP;Trials of upgraded texture/liquids;Cognitive reorganization;Multimodal communcation approach;Compensatory strategies;Pharyngeal strengthening exercises;Language facilitation;Compensatory techniques;Cueing hierarchy;Internal/external aids;Functional tasks;SLP instruction and feedback;Patient/family education;Other (comment) MBS    Potential to Achieve Goals  Good    SLP Home Exercise Plan  loud /a/    Consulted and Agree with Plan of Care  Patient       Patient will benefit from skilled therapeutic intervention in order to improve the following deficits and impairments:   Dysarthria and anarthria  Cognitive communication deficit  Dysphagia, oropharyngeal phase    Problem List Patient Active Problem List   Diagnosis Date Noted  . Strain of right biceps 09/12/2017  . Lymphedema 02/10/2017  . BPH associated with nocturia 05/16/2016  . Senile purpura ([place]) 05/03/2016  . OSA (obstructive sleep  apnea) 12/28/2015  . Hypersomnia 11/15/2015  . Cough 11/15/2015  . 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 Dizziness 12/30/2014  . Diastolic dysfunction 11/46/4314  . Former smoker 04/29/2014  . Chronic venous insufficiency 02/20/2014  . Hyperglycemia 08/02/2012  . Obesity 07/13/2010  . History of malignant melanoma. left leg 03/09/2009  . Insomnia 10/03/2007  . Hyperlipidemia 12/13/2006  . Essential hypertension 12/13/2006   Deneise Lever, Liborio Negron Torres, [place] 02/05/2018, 1:13 PM  [facility] [address] [place], [place], [address] Phone: [phone]   Fax:  [phone]  Name: Nicholas [name] Date of Birth:  October 22, 1945

## 2018-02-05 NOTE — Patient Instructions (Signed)

## 2018-02-06 NOTE — Therapy (Signed)
[facility] [address] [place], [place], [address] Phone: [phone]   Fax:  [phone]  Occupational Therapy Treatment  Patient Details  Name: Nicholas [name] MRN: [id number] Date of Birth: 11-07-1945 Referring Provider: [name]   Encounter Date: 02/05/2018  OT End of Session - 02/05/18 1217    Visit Number  2    Number of Visits  17    Date for OT Re-Evaluation  03/09/18    Authorization Type  [employer] (Medicare)    Authorization Time Period  cert.  01/08/18-04/08/18 week 1    OT Start Time  1100    OT Stop Time  1143    OT Time Calculation (min)  43 min       Past Medical History:  Diagnosis Date  . 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 Hyperlipidemia   . Hypertension   . Lymphedema     Past Surgical History:  Procedure Laterality Date  . 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 removal of melanoma of left foot/amputation of toes 1&25 Jul 2006  . WRIST FRACTURE SURGERY     72 years old-set    There were no vitals filed for this visit.  Subjective Assessment - 02/06/18 1252    Subjective   denies  pain    Patient Stated Goals  improve handwriting    Currently in Pain?  No/denies             Treatment: Dynamic step and  reach with trunk rotation, min-mod v.c for big movements and close supervision for balance. Pt practiced using strategies for turning and walking to table to minimize freezing, min v.c PWR! hands basic 4,  10 reps each, min v.c/ demo Coordination activities: flipping and dealing cards, then stacking and manipulating coins, min v.c for larger amplitude movement.                OT Short Term Goals - 02/06/18 1250      OT SHORT TERM GOAL #1   Title  Pt will be independent with updated HEP.--check STGs 02/07/18    Time  4    Period  Weeks    Status  On-going      OT SHORT TERM GOAL #2   Title  Pt will be  able to write short paragraph and sign name with 100% legibility and only minimal decr in size use AE/strategies.    Time  4    Period  Weeks    Status  On-going      OT SHORT TERM GOAL #3   Title  Pt will improve coordination for ADLs as shown by fastening/unfastening 3 buttons in 43sec or less.    Baseline  47.37    Time  4    Period  Weeks    Status  On-going      OT SHORT TERM GOAL #4   Title  Pt will demonstrate improved dynamic standing balance as eveidenced by performing 10 inches or greater on standing functional reach.    Baseline  RUE 10 inches, LUE 8.5    Time  4    Period  Weeks    Status  On-going        OT Long Term Goals - 01/09/18 0003      OT LONG TERM GOAL #1   Title  Pt will verbalize understanding of AE/strategies  to incr safety, ease, and independence with ADLs/IADLs.--check LTGs 03/10/18    Time  8    Period  Weeks    Status  New      OT LONG TERM GOAL #2   Title  Pt will improve coordination for ADLs as shown by improving time on 9-hole peg test by at least 3sec bilaterally.    Baseline  R-33.69, L-30.69sec    Time  8    Period  Weeks    Status  New      OT LONG TERM GOAL #3   Title  Pt will improve coordination for ADLs as shown by fastening/unfastening 3 buttons in 40sec or less.    Baseline  47.37    Time  8    Period  Weeks    Status  New      OT LONG TERM GOAL #4   Title  Pt will improve ability to dress as shown by improving time on PPT#4 by at least 3 sec and incorporate trunk rotation to decr risk for shoulder injury.    Baseline  19.68sec with no trunk rotation    Time  8    Period  Weeks    Status  New      OT LONG TERM GOAL #5   Title  Pt will improve bradykinesia/hypokinesia for functional reaching/coordination for ADLs as shown by improving score on box and blocks test by at least 3 with LUE.    Baseline  40 blocks    Time  8    Period  Weeks    Status  New            Plan - 02/06/18 1248    Clinical Impression  Statement  Pt is progressing towards goals. He responds well to v.c for big movements with functional activity.    Occupational performance deficits (Please refer to evaluation for details):  ADL's;IADL's;Leisure;Work;Rest and Sleep;Social Participation    Rehab Potential  Good    OT Frequency  2x / week    OT Duration  8 weeks    OT Treatment/Interventions  Self-care/ADL training;Therapeutic exercise;Visual/perceptual remediation/compensation;Patient/family education;Neuromuscular education;Moist Heat;Fluidtherapy;Energy conservation;Therapist, nutritional;Therapeutic activities;Balance training;Cognitive remediation/compensation;Passive range of motion;Manual Therapy;DME and/or AE instruction;Ultrasound;Contrast Bath;Cryotherapy       Patient will benefit from skilled therapeutic intervention in order to improve the following deficits and impairments:  Decreased cognition, Decreased knowledge of use of DME, Impaired vision/preception, Decreased mobility, Decreased coordination, Decreased activity tolerance, Decreased range of motion, Impaired tone, Impaired UE functional use, Impaired perceived functional ability, Decreased safety awareness, Decreased knowledge of precautions, Decreased balance  Visit Diagnosis: Other symptoms and signs involving the musculoskeletal system  Other symptoms and signs involving the nervous system  Muscle weakness (generalized)  Other lack of coordination  Other abnormalities of gait and mobility    Problem List Patient Active Problem List   Diagnosis Date Noted  . Strain of right biceps 09/12/2017  . Lymphedema 02/10/2017  . BPH associated with nocturia 05/16/2016  . Senile purpura ([place]) 05/03/2016  . OSA (obstructive sleep apnea) 12/28/2015  . Hypersomnia 11/15/2015  . Cough 11/15/2015  . 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 Dizziness 12/30/2014  . Diastolic dysfunction 29/79/8921  . Former smoker 04/29/2014  . Chronic venous  insufficiency 02/20/2014  . Hyperglycemia 08/02/2012  . Obesity 07/13/2010  . History of malignant melanoma. left leg 03/09/2009  . Insomnia 10/03/2007  . Hyperlipidemia 12/13/2006  . Essential hypertension 12/13/2006    Kemara Quigley 02/06/2018, 12:56 PM Curt Bears  Mona Ayars, OTR/L [phone] Phone: [phone] 12:56 PM 02/06/18 [facility] [address] [place], [place], [address] Phone: [phone]   Fax:  [phone]  Name: Nicholas [name] MRN: [id number] Date of Birth: July 25, 1945

## 2018-02-08 ENCOUNTER — Ambulatory Visit: Payer: PPO | Admitting: Occupational Therapy

## 2018-02-08 ENCOUNTER — Other Ambulatory Visit: Payer: Self-pay | Admitting: Family Medicine

## 2018-02-08 ENCOUNTER — Ambulatory Visit: Payer: PPO | Admitting: Physical Therapy

## 2018-02-08 ENCOUNTER — Other Ambulatory Visit (HOSPITAL_COMMUNITY): Payer: Self-pay | Admitting: Neurology

## 2018-02-08 ENCOUNTER — Encounter: Payer: Self-pay | Admitting: Physical Therapy

## 2018-02-08 DIAGNOSIS — R29818 Other symptoms and signs involving the nervous system: Secondary | ICD-10-CM

## 2018-02-08 DIAGNOSIS — R1310 Dysphagia, unspecified: Secondary | ICD-10-CM

## 2018-02-08 DIAGNOSIS — R29898 Other symptoms and signs involving the musculoskeletal system: Secondary | ICD-10-CM

## 2018-02-08 DIAGNOSIS — R2689 Other abnormalities of gait and mobility: Secondary | ICD-10-CM | POA: Diagnosis not present

## 2018-02-08 DIAGNOSIS — R278 Other lack of coordination: Secondary | ICD-10-CM

## 2018-02-08 DIAGNOSIS — R2681 Unsteadiness on feet: Secondary | ICD-10-CM

## 2018-02-08 DIAGNOSIS — M6281 Muscle weakness (generalized): Secondary | ICD-10-CM

## 2018-02-08 NOTE — Therapy (Signed)
[facility] [address] [place], [place], [address] Phone: [phone]   Fax:  [phone]  Occupational Therapy Treatment  Patient Details  Name: Nicholas [name] MRN: [id number] Date of Birth: Feb 20, 1946 Referring Provider: [name]   Encounter Date: 02/08/2018  OT End of Session - 02/08/18 1238    Visit Number  3    Number of Visits  17    Date for OT Re-Evaluation  03/09/18    Authorization Type  [employer] (Medicare)    OT Start Time  1147    OT Stop Time  1230    OT Time Calculation (min)  43 min       Past Medical History:  Diagnosis Date  . Cancer [facility]) Jan 2008   skin; hx of melanoma left foot/ amputation of toes 1&2   . Hyperlipidemia   . Hypertension   . Lymphedema     Past Surgical History:  Procedure Laterality Date  . 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 removal of melanoma of left foot/amputation of toes 1&25 Jul 2006  . WRIST FRACTURE SURGERY     72 years old-set    There were no vitals filed for this visit.  Subjective Assessment - 02/08/18 1238    Pertinent History  hx of PD/Parkinson's Plus Syndrone; malignant melanoma, hyperlipidemia, HTN, insomnia, obesity, hyperglycemia, dizziness, OSA, urinary urgency, lymphedema    Patient Stated Goals  improve handwriting    Currently in Pain?  No/denies            Treatment: PWR! seated, basic 4, 10-20 reps each, min v.c discussed adapted strategies for donning shoes, currently  is propping his foot on stairs to don, therapist cautioned pt regarding fall risk and suggested foot stool. Standing dynamic step and reach while flipping playing cards, mod v.c for large amplitude movements Ambulating arounnd table utilizing strategies to minimize freezing, min difficulty/ v.c                 OT Short Term Goals - 02/06/18 1250      OT SHORT TERM GOAL #1   Title  Pt will be independent with updated HEP.--check STGs  02/07/18    Time  4    Period  Weeks    Status  On-going      OT SHORT TERM GOAL #2   Title  Pt will be able to write short paragraph and sign name with 100% legibility and only minimal decr in size use AE/strategies.    Time  4    Period  Weeks    Status  On-going      OT SHORT TERM GOAL #3   Title  Pt will improve coordination for ADLs as shown by fastening/unfastening 3 buttons in 43sec or less.    Baseline  47.37    Time  4    Period  Weeks    Status  On-going      OT SHORT TERM GOAL #4   Title  Pt will demonstrate improved dynamic standing balance as eveidenced by performing 10 inches or greater on standing functional reach.    Baseline  RUE 10 inches, LUE 8.5    Time  4    Period  Weeks    Status  On-going        OT Long Term Goals - 01/09/18 0003      OT LONG TERM GOAL #1   Title  Pt will verbalize understanding  of AE/strategies to incr safety, ease, and independence with ADLs/IADLs.--check LTGs 03/10/18    Time  8    Period  Weeks    Status  New      OT LONG TERM GOAL #2   Title  Pt will improve coordination for ADLs as shown by improving time on 9-hole peg test by at least 3sec bilaterally.    Baseline  R-33.69, L-30.69sec    Time  8    Period  Weeks    Status  New      OT LONG TERM GOAL #3   Title  Pt will improve coordination for ADLs as shown by fastening/unfastening 3 buttons in 40sec or less.    Baseline  47.37    Time  8    Period  Weeks    Status  New      OT LONG TERM GOAL #4   Title  Pt will improve ability to dress as shown by improving time on PPT#4 by at least 3 sec and incorporate trunk rotation to decr risk for shoulder injury.    Baseline  19.68sec with no trunk rotation    Time  8    Period  Weeks    Status  New      OT LONG TERM GOAL #5   Title  Pt will improve bradykinesia/hypokinesia for functional reaching/coordination for ADLs as shown by improving score on box and blocks test by at least 3 with LUE.    Baseline  40 blocks     Time  8    Period  Weeks    Status  New            Plan - 02/08/18 1241    Clinical Impression Statement  Pt is progressing towards goals. He demonstrates understanding of seated PWR! moves. Pt recently had a fall but he only bruised his arm    Occupational Profile and client history currently impacting functional performance  Pt is working part time and performing BADLs mostly mod I-min A (was mod I)  Pt reports incr difficulty with ADLs and decr in IADL performance over last 52months-year.      Occupational performance deficits (Please refer to evaluation for details):  ADL's;IADL's;Leisure;Work;Rest and Sleep;Social Participation    Rehab Potential  Good    OT Frequency  2x / week    OT Duration  8 weeks    OT Treatment/Interventions  Self-care/ADL training;Therapeutic exercise;Visual/perceptual remediation/compensation;Patient/family education;Neuromuscular education;Moist Heat;Fluidtherapy;Energy conservation;Therapist, nutritional;Therapeutic activities;Balance training;Cognitive remediation/compensation;Passive range of motion;Manual Therapy;DME and/or AE instruction;Ultrasound;Contrast Bath;Cryotherapy    Plan  continue to address dynamic step and reach, freezing, adapted strategies for ADLs.    Consulted and Agree with Plan of Care  Patient       Patient will benefit from skilled therapeutic intervention in order to improve the following deficits and impairments:  Decreased cognition, Decreased knowledge of use of DME, Impaired vision/preception, Decreased mobility, Decreased coordination, Decreased activity tolerance, Decreased range of motion, Impaired tone, Impaired UE functional use, Impaired perceived functional ability, Decreased safety awareness, Decreased knowledge of precautions, Decreased balance  Visit Diagnosis: Other symptoms and signs involving the musculoskeletal system  Other symptoms and signs involving the nervous system  Muscle weakness  (generalized)  Other lack of coordination  Other abnormalities of gait and mobility  Unsteadiness on feet    Problem List Patient Active Problem List   Diagnosis Date Noted  . Strain of right biceps 09/12/2017  . Lymphedema 02/10/2017  . BPH associated with nocturia  05/16/2016  . Senile purpura ([place]) 05/03/2016  . OSA (obstructive sleep apnea) 12/28/2015  . Hypersomnia 11/15/2015  . Cough 11/15/2015  . 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 Dizziness 12/30/2014  . Diastolic dysfunction 92/34/1443  . Former smoker 04/29/2014  . Chronic venous insufficiency 02/20/2014  . Hyperglycemia 08/02/2012  . Obesity 07/13/2010  . History of malignant melanoma. left leg 03/09/2009  . Insomnia 10/03/2007  . Hyperlipidemia 12/13/2006  . Essential hypertension 12/13/2006    RINE,KATHRYN 02/08/2018, 12:44 PM  [facility] [address] [place], [place], [address] Phone: [phone]   Fax:  [phone]  Name: Nicholas [name] MRN: [id number] Date of Birth: [birth year]

## 2018-02-09 NOTE — Therapy (Signed)
[facility] [address] [place], [place], [address] Phone: [phone]   Fax:  [phone]  Physical Therapy Treatment  Patient Details  Name: Nicholas [name] MRN: [id number] Date of Birth: 1946-03-11 Referring Provider: [name]   Encounter Date: 02/08/2018  PT End of Session - 02/09/18 0802    Visit Number  3    Number of Visits  17    Date for PT Re-Evaluation  04/08/18    Authorization Type  [employer] (Medicare)-will need 10th visit progress note    Authorization Time Period  cert 8/58/85-0/27/74    PT Start Time  1102    PT Stop Time  1143    PT Time Calculation (min)  41 min    Activity Tolerance  Patient tolerated treatment well    Behavior During Therapy  [facility] for tasks assessed/performed       Past Medical History:  Diagnosis Date  . 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 Hyperlipidemia   . Hypertension   . Lymphedema     Past Surgical History:  Procedure Laterality Date  . 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 removal of melanoma of left foot/amputation of toes 1&25 Jul 2006  . WRIST FRACTURE SURGERY     72 years old-set    There were no vitals filed for this visit.  Subjective Assessment - 02/08/18 1107    Subjective  Fell this morning. Went to push open bedroom door (that usually sticks) and it didn't stick and he fell forward onto rollator. Scrape on left forearm only injury.     Pertinent History  Lymphedema-has compression machine and uses stockings (wife helps); hx of PD/Parkinson's Plus Syndrone; malignant melanoma, hyperlipidemia, HTN, insomnia, obesity, hyperglycemia, dizziness, OSA, urinary urgency    Patient Stated Goals  Pt's goals for therapy are to "be like I used to be."    Currently in Pain?  No/denies                       [facility] Adult PT Treatment/Exercise - 02/08/18 1142      Transfers   Sit to Stand  5:  Supervision;Without upper extremity assist    Stand to Sit  5: Supervision;4: Min guard;Without upper extremity assist    Transfer Cueing  especially practiced approach to surface prior to sitting (turning steps, counting to reduce freezing)      Ambulation/Gait   Ambulation/Gait Assistance  5: Supervision;4: Min guard    Ambulation/Gait Assistance Details  vc for self-counting steps (he uses left-right-left-right) but frequently breaks concentration and forgets to restart his calling; practiced through door ways, approaching bed to sit, tight turns all with lots of freezing and cues needed to stop and reset, then how to restart (weight shifting worked well with calling left-right)    Ambulation Distance (Feet)  80 Feet 100, 100, 200, 100    Assistive device  Rollator    Gait Pattern  Step-through pattern;Decreased step length - right;Decreased step length - left;Decreased dorsiflexion - right;Decreased dorsiflexion - left;Poor foot clearance - left;Poor foot clearance - right;Festinating    Ambulation Surface  Indoor    Pre-Gait Activities  step taps onto 3" step with heels touching to incr foot clearance and heelstrike in gait; + carryover for 50 ft and when cued  PT Education - 02/09/18 0801    Education Details  review of tips to reduce freezing (pt able to verbalize but continues to need prompts to implement when freezing occurs)    Person(s) Educated  Patient    Methods  Explanation;Demonstration;Verbal cues    Comprehension  Verbalized understanding;Verbal cues required;Need further instruction       PT Short Term Goals - 02/05/18 2120      PT SHORT TERM GOAL #1   Title  Pt will be independent with HEP for improved balance, transfers and gait.  Updated TARGET 03/01/18    Time  4    Period  Weeks    Status  On-going    Target Date  03/01/18      PT SHORT TERM GOAL #2   Title  Pt will improve TUG score to less than or equal to 50 seconds for decreased fall risk.     Time  4    Period  Weeks    Status  On-going    Target Date  03/01/18      PT SHORT TERM GOAL #3   Title  Pt wil perform at least 8 of 10 reps of sit<>stand transfers from 18 inch surfaces, with no posterior lean, independently for improved transfer efficiency and safety.    Time  4    Period  Weeks    Status  On-going    Target Date  03/01/18      PT SHORT TERM GOAL #4   Title  Pt will verbalize undersatnding of techniques to reduce freezing episodes with gait and turns    Time  4    Period  Weeks    Status  On-going    Target Date  03/01/18        PT Long Term Goals - 02/05/18 2122      PT LONG TERM GOAL #1   Title  Pt will verbalize understanding of fall prevention within the home environment.  TARGET 03/29/18    Time  8    Period  Weeks    Status  On-going    Target Date  03/29/18      PT LONG TERM GOAL #2   Title  Pt will improve TUG score to less than or equal to 35 seconds for decreased fall risk.    Time  8    Period  Weeks    Status  On-going    Target Date  03/29/18      PT LONG TERM GOAL #3   Title  Pt will improve gait velocity with conversation to at least 2.62 ft/sec for improved gait efficiency and safety.    Time  8    Period  Weeks    Status  On-going    Target Date  03/29/18      PT LONG TERM GOAL #4   Title  Pt will verbalize plans for continued community fitness upon d/c from PT.    Time  8    Status  On-going    Target Date  03/29/18            Plan - 02/09/18 0803    Clinical Impression Statement  Focus on safe use of rollator, tips to reduce freezing in gait, and tips to manage freezing when it occurs with entire session focused on these issues. Utilized rollator in narrow spaces, through doorways, and turning to sit with much difficulty with each. Although pt could verbalize what technique he was going to  use (typically "counting" left-right-left-right and focusing gaze on object beyond the narrow space) immediately prior to  practice, he would not carry-through to task. (?internally as well as externally distracted). Patient will continue to need lots of repetition and cues to implement strategies to reduce or work through freezing episodes.     Rehab Potential  Good    Clinical Impairments Affecting Rehab Potential  for PT goals/tx plan    PT Frequency  2x / week    PT Duration  8 weeks plus eval    PT Treatment/Interventions  ADLs/Self Care Home Management;DME Instruction;Gait training;Functional mobility training;Therapeutic activities;Therapeutic exercise;Balance training;Patient/family education;Neuromuscular re-education    PT Next Visit Plan  Review tips to reduce freezing with gait; education on safety with use of rollator; PWR! Moves in sitting or standing; activities to improve foot clearance with gait    Consulted and Agree with Plan of Care  Patient       Patient will benefit from skilled therapeutic intervention in order to improve the following deficits and impairments:  Abnormal gait, Decreased balance, Decreased mobility, Difficulty walking, Decreased safety awareness, Decreased strength, Postural dysfunction  Visit Diagnosis: Other symptoms and signs involving the nervous system  Other abnormalities of gait and mobility  Unsteadiness on feet     Problem List Patient Active Problem List   Diagnosis Date Noted  . Strain of right biceps 09/12/2017  . Lymphedema 02/10/2017  . BPH associated with nocturia 05/16/2016  . Senile purpura ([place]) 05/03/2016  . OSA (obstructive sleep apnea) 12/28/2015  . Hypersomnia 11/15/2015  . Cough 11/15/2015  . 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 Dizziness 12/30/2014  . Diastolic dysfunction 94/70/9628  . Former smoker 04/29/2014  . Chronic venous insufficiency 02/20/2014  . Hyperglycemia 08/02/2012  . Obesity 07/13/2010  . History of malignant melanoma. left leg 03/09/2009  . Insomnia 10/03/2007  . Hyperlipidemia 12/13/2006  . Essential  hypertension 12/13/2006    Rexanne Mano, PT 02/09/2018, 8:08 AM  [facility] [address] [place], [place], [address] Phone: [phone]   Fax:  [phone]  Name: Nicholas [name] MRN: [id number] Date of Birth: 1946-01-03

## 2018-02-12 ENCOUNTER — Ambulatory Visit: Payer: PPO | Admitting: Occupational Therapy

## 2018-02-12 ENCOUNTER — Ambulatory Visit: Payer: PPO

## 2018-02-12 ENCOUNTER — Encounter: Payer: Self-pay | Admitting: Physical Therapy

## 2018-02-12 ENCOUNTER — Ambulatory Visit: Payer: PPO | Admitting: Physical Therapy

## 2018-02-12 DIAGNOSIS — R1312 Dysphagia, oropharyngeal phase: Secondary | ICD-10-CM

## 2018-02-12 DIAGNOSIS — R29898 Other symptoms and signs involving the musculoskeletal system: Secondary | ICD-10-CM

## 2018-02-12 DIAGNOSIS — R278 Other lack of coordination: Secondary | ICD-10-CM

## 2018-02-12 DIAGNOSIS — R2689 Other abnormalities of gait and mobility: Secondary | ICD-10-CM | POA: Diagnosis not present

## 2018-02-12 DIAGNOSIS — R41841 Cognitive communication deficit: Secondary | ICD-10-CM

## 2018-02-12 DIAGNOSIS — R29818 Other symptoms and signs involving the nervous system: Secondary | ICD-10-CM

## 2018-02-12 DIAGNOSIS — R471 Dysarthria and anarthria: Secondary | ICD-10-CM

## 2018-02-12 DIAGNOSIS — M6281 Muscle weakness (generalized): Secondary | ICD-10-CM

## 2018-02-12 NOTE — Therapy (Signed)
[facility] [address] [place], [place], [address] Phone: [phone]   Fax:  [phone]  Speech Language Pathology Treatment  Patient Details  Name: Nicholas [name] MRN: [id number] Date of Birth: 1945-09-10 Referring Provider: [name]    Encounter Date: 02/12/2018  End of Session - 02/12/18 1622    Visit Number  2    Number of Visits  17    Date for SLP Re-Evaluation  04/19/18    SLP Start Time  7902    SLP Stop Time   4097    SLP Time Calculation (min)  42 min       Past Medical History:  Diagnosis Date  . 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 Hyperlipidemia   . Hypertension   . Lymphedema     Past Surgical History:  Procedure Laterality Date  . 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 removal of melanoma of left foot/amputation of toes 1&25 Jul 2006  . WRIST FRACTURE SURGERY     72 years old-set    There were no vitals filed for this visit.  Subjective Assessment - 02/12/18 1537    Subjective  Pt freezing on the way into ST room. Cues for marching and cadence assisted pt.    Currently in Pain?  No/denies            ADULT SLP TREATMENT - 02/12/18 1538      General Information   Behavior/Cognition  Alert;Cooperative;Pleasant mood      Treatment Provided   Treatment provided  Cognitive-Linquistic      Cognitive-Linquistic Treatment   Treatment focused on  Dysarthria    Skilled Treatment  Pt reported that his loudness during evaluation was disappointing. SLP told pt that consistency was key in thearpy for PD and that pt would need to practice every day for progress with content and loudness. SLP worked with pt today with tasks to improve conversational loudness. Loud /a/ produced with average low 90s dB, and pt described cards (phrase level) with average low 70s dB (last 5 were softer even despite SLP cues for loudness). In sentnece  responses, occasional min-mod cues were necessary for pt to maintain upper 60s average loudness, otherwise loudness was in mid-60s dB. SLP told pt it was necessary for him to spend at least 20 mintues per day on homework for speech loudness, along with x5 reps of loud /a/ BID.      Assessment / Recommendations / Plan   Plan  Continue with current plan of care      Progression Toward Goals   Progression toward goals  Progressing toward goals       SLP Education - 02/12/18 1621    Education Details  consistency in practice (begin at 20 minutes/day) and with loud /a/ daily    Person(s) Educated  Patient    Methods  Explanation    Comprehension  Verbalized understanding       SLP Short Term Goals - 02/12/18 1623      SLP SHORT TERM GOAL #1   Title  pt will sustain loud /a/ at >90 dB at 30 cm over 3 consecutive sessions    Baseline  02-12-18    Time  4    Period  Weeks    Status  On-going      SLP SHORT TERM GOAL #2   Title  pt will  exhibit volume of 70dB in sentence responses 95% over 3 sessions    Time  4    Period  Weeks    Status  On-going      SLP SHORT TERM GOAL #3   Title  In 7 minutes simple conversation pt will achieve average speech volume of low 70s dB over two sessions    Time  4    Period  Weeks    Status  On-going      SLP SHORT TERM GOAL #4   Title  Pt will participate in objective assessment of swallow function (MBS or FEES) if warranted.    Time  4    Period  Weeks    Status  On-going      SLP SHORT TERM GOAL #5   Title  Pt will complete simple-mod complex naming tasks with 90% accuracy and rare min A     Time  4    Period  Weeks    Status  On-going       SLP Long Term Goals - 02/12/18 1623      SLP LONG TERM GOAL #1   Title  pt will sustain loud /a/ at average >90dB at 30 cm over 5 consecutive sessions    Baseline  02-12-18    Time  8    Period  Weeks    Status  On-going      SLP LONG TERM GOAL #2   Title  pt will generate speech in 10 minutes  mod complex conversation with average >70dB over three sessions    Time  8    Period  Weeks    Status  On-going      SLP LONG TERM GOAL #3   Title  Pt will tell SLP 3 s/sx of aspiration PNA.    Time  8    Period  Weeks    Status  On-going      SLP LONG TERM GOAL #4   Title  Pt will utilize compensations for anomia in structured tasks with rare min A over 4 sessions.     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 I recommend cont'd skilled ST to address dysarthria, cognitive-communication and dysphagia. SLP to monitor diet tolerance; will request MBS orders and schedule objective testing if warranted.     Speech Therapy Frequency  2x / week    Duration  -- 8 weeks or 16 additional visits    Treatment/Interventions  Aspiration precaution training;Diet toleration management by SLP;Trials of upgraded texture/liquids;Cognitive reorganization;Multimodal communcation approach;Compensatory strategies;Pharyngeal strengthening exercises;Language facilitation;Compensatory techniques;Cueing hierarchy;Internal/external aids;Functional tasks;SLP instruction and feedback;Patient/family education;Other (comment) MBS    Potential to Achieve Goals  Good    SLP Home Exercise Plan  loud /a/    Consulted and Agree with Plan of Care  Patient       Patient will benefit from skilled therapeutic intervention in order to improve the following deficits and impairments:   Dysarthria and anarthria  Cognitive communication deficit  Dysphagia, oropharyngeal phase    Problem List Patient Active Problem List   Diagnosis Date Noted  . Strain of right biceps 09/12/2017  . Lymphedema 02/10/2017  . BPH associated with nocturia 05/16/2016  .  Senile purpura  ([place]) 05/03/2016  . OSA (obstructive sleep apnea) 12/28/2015  . Hypersomnia 11/15/2015  . Cough 11/15/2015  . 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 Dizziness 12/30/2014  . Diastolic dysfunction 16/58/0063  . Former smoker 04/29/2014  . Chronic venous insufficiency 02/20/2014  . Hyperglycemia 08/02/2012  . Obesity 07/13/2010  . History of malignant melanoma. left leg 03/09/2009  . Insomnia 10/03/2007  . Hyperlipidemia 12/13/2006  . Essential hypertension 12/13/2006    [facility] ,MS, [place]  02/12/2018, 4:25 PM  [facility] [address] [place], [place], [address] Phone: [phone]   Fax:  [phone]   Name: MICHAELA SHANKEL MRN: [id number] Date of Birth: 08/27/1945

## 2018-02-12 NOTE — Patient Instructions (Signed)
  Please complete the assigned speech therapy homework prior to your next session and return it to the speech therapist at your next visit.  

## 2018-02-13 NOTE — Therapy (Signed)
[facility] [address] [place], [place], [address] Phone: [phone]   Fax:  [phone]  Occupational Therapy Treatment  Patient Details  Name: Nicholas [name] MRN: [id number] Date of Birth: 1946-04-20 Referring Provider: [name]   Encounter Date: 02/12/2018  OT End of Session - 02/12/18 1452    Visit Number  4    Number of Visits  17    Date for OT Re-Evaluation  03/09/18    Authorization Type  [employer] (Medicare)    Authorization Time Period  cert.  01/08/18-04/08/18 week 1    OT Start Time  1451    OT Stop Time  1530    OT Time Calculation (min)  39 min    Activity Tolerance  Patient tolerated treatment well    Behavior During Therapy  WFL for tasks assessed/performed       Past Medical History:  Diagnosis Date  . Cancer [facility]) Jan 2008   skin; hx of melanoma left foot/ amputation of toes 1&2   . Hyperlipidemia   . Hypertension   . Lymphedema     Past Surgical History:  Procedure Laterality Date  . 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 removal of melanoma of left foot/amputation of toes 1&25 Jul 2006  . WRIST FRACTURE SURGERY     72 years old-set    There were no vitals filed for this visit.  Subjective Assessment - 02/12/18 1451    Subjective   pt denies pain    Pertinent History  hx of PD/Parkinson's Plus Syndrone; malignant melanoma, hyperlipidemia, HTN, insomnia, obesity, hyperglycemia, dizziness, OSA, urinary urgency, lymphedema    Patient Stated Goals  improve handwriting    Currently in Pain?  No/denies              Treatment: Dynamic step and reach with left and right UE's to copy small peg design, mod v.c for bigger steps/ movements Strategies for turning in small space with practice, max difficulty turning to right due to freezing.             OT Education - 02/13/18 1731    Education Details  PWR! supine for PWR! up, rock and twist- 10-20 reps each    Person(s) Educated  Patient    Methods  Explanation;Demonstration;Verbal cues    Comprehension  Verbalized understanding;Returned demonstration;Verbal cues required       OT Short Term Goals - 02/06/18 1250      OT SHORT TERM GOAL #1   Title  Pt will be independent with updated HEP.--check STGs 02/07/18    Time  4    Period  Weeks    Status  On-going      OT SHORT TERM GOAL #2   Title  Pt will be able to write short paragraph and sign name with 100% legibility and only minimal decr in size use AE/strategies.    Time  4    Period  Weeks    Status  On-going      OT SHORT TERM GOAL #3   Title  Pt will improve coordination for ADLs as shown by fastening/unfastening 3 buttons in 43sec or less.    Baseline  47.37    Time  4    Period  Weeks    Status  On-going      OT SHORT TERM GOAL #4   Title  Pt will demonstrate improved dynamic standing balance as eveidenced by performing 10 inches  or greater on standing functional reach.    Baseline  RUE 10 inches, LUE 8.5    Time  4    Period  Weeks    Status  On-going        OT Long Term Goals - 01/09/18 0003      OT LONG TERM GOAL #1   Title  Pt will verbalize understanding of AE/strategies to incr safety, ease, and independence with ADLs/IADLs.--check LTGs 03/10/18    Time  8    Period  Weeks    Status  New      OT LONG TERM GOAL #2   Title  Pt will improve coordination for ADLs as shown by improving time on 9-hole peg test by at least 3sec bilaterally.    Baseline  R-33.69, L-30.69sec    Time  8    Period  Weeks    Status  New      OT LONG TERM GOAL #3   Title  Pt will improve coordination for ADLs as shown by fastening/unfastening 3 buttons in 40sec or less.    Baseline  47.37    Time  8    Period  Weeks    Status  New      OT LONG TERM GOAL #4   Title  Pt will improve ability to dress as shown by improving time on PPT#4 by at least 3 sec and incorporate trunk rotation to decr risk for shoulder injury.    Baseline   19.68sec with no trunk rotation    Time  8    Period  Weeks    Status  New      OT LONG TERM GOAL #5   Title  Pt will improve bradykinesia/hypokinesia for functional reaching/coordination for ADLs as shown by improving score on box and blocks test by at least 3 with LUE.    Baseline  40 blocks    Time  8    Period  Weeks    Status  New            Plan - 02/13/18 1730    Clinical Impression Statement  Pt is progressing towards goals. He demonstrates understanding of supine PWR! moves. Pt continues to experience freezing episodes particularly when turning in small spaces.    Rehab Potential  Good    OT Frequency  2x / week    OT Duration  8 weeks    OT Treatment/Interventions  Self-care/ADL training;Therapeutic exercise;Visual/perceptual remediation/compensation;Patient/family education;Neuromuscular education;Moist Heat;Fluidtherapy;Energy conservation;Therapist, nutritional;Therapeutic activities;Balance training;Cognitive remediation/compensation;Passive range of motion;Manual Therapy;DME and/or AE instruction;Ultrasound;Contrast Bath;Cryotherapy    Plan  continue to address dynamic step and reach, freezing, adapted strategies for ADLs.    Consulted and Agree with Plan of Care  Patient       Patient will benefit from skilled therapeutic intervention in order to improve the following deficits and impairments:  Decreased cognition, Decreased knowledge of use of DME, Impaired vision/preception, Decreased mobility, Decreased coordination, Decreased activity tolerance, Decreased range of motion, Impaired tone, Impaired UE functional use, Impaired perceived functional ability, Decreased safety awareness, Decreased knowledge of precautions, Decreased balance  Visit Diagnosis: Other symptoms and signs involving the musculoskeletal system  Other symptoms and signs involving the nervous system  Muscle weakness (generalized)  Other lack of coordination    Problem List Patient  Active Problem List   Diagnosis Date Noted  . Strain of right biceps 09/12/2017  . Lymphedema 02/10/2017  . BPH associated with nocturia 05/16/2016  . Senile purpura ([place])  05/03/2016  . OSA (obstructive sleep apnea) 12/28/2015  . Hypersomnia 11/15/2015  . Cough 11/15/2015  . 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 Dizziness 12/30/2014  . Diastolic dysfunction 87/56/4332  . Former smoker 04/29/2014  . Chronic venous insufficiency 02/20/2014  . Hyperglycemia 08/02/2012  . Obesity 07/13/2010  . History of malignant melanoma. left leg 03/09/2009  . Insomnia 10/03/2007  . Hyperlipidemia 12/13/2006  . Essential hypertension 12/13/2006    RINE,KATHRYN 02/13/2018, 5:32 PM  [facility] [address] [place], [place], [address] Phone: [phone]   Fax:  [phone]  Name: Nicholas [name] MRN: [id number] Date of Birth: November 19, 1945

## 2018-02-13 NOTE — Therapy (Signed)
[facility] [address] [place], [place], [address] Phone: [phone]   Fax:  [phone]  Physical Therapy Treatment  Patient Details  Name: Nicholas [name] MRN: [id number] Date of Birth: 02/26/1946 Referring Provider: [name]   Encounter Date: 02/12/2018  PT End of Session - 02/13/18 1229    Visit Number  4    Number of Visits  17    Date for PT Re-Evaluation  04/08/18    Authorization Type  [employer] (Medicare)-will need 10th visit progress note    Authorization Time Period  cert 01/06/06-3/71/06    PT Start Time  1413 Pt arrives late    PT Stop Time  1445    PT Time Calculation (min)  32 min    Activity Tolerance  Patient tolerated treatment well    Behavior During Therapy  [facility] for tasks assessed/performed       Past Medical History:  Diagnosis Date  . 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 Hyperlipidemia   . Hypertension   . Lymphedema     Past Surgical History:  Procedure Laterality Date  . 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 removal of melanoma of left foot/amputation of toes 1&25 Jul 2006  . WRIST FRACTURE SURGERY     72 years old-set    There were no vitals filed for this visit.  Subjective Assessment - 02/12/18 1417    Subjective  No more falls since last visit.      Pertinent History  Lymphedema-has compression machine and uses stockings (wife helps); hx of PD/Parkinson's Plus Syndrone; malignant melanoma, hyperlipidemia, HTN, insomnia, obesity, hyperglycemia, dizziness, OSA, urinary urgency    Patient Stated Goals  Pt's goals for therapy are to "be like I used to be."    Currently in Pain?  No/denies                       [facility] Adult PT Treatment/Exercise - 02/13/18 1223      Transfers   Transfers  Sit to Stand;Stand to Sit    Sit to Stand  5: Supervision;With upper extremity assist;From bed    Stand to Sit  5:  Supervision;4: Min guard;With upper extremity assist;To bed    Stand to Sit Details (indicate cue type and reason)  Verbal cues for sequencing;Verbal cues for technique Cues for hand placement      Ambulation/Gait   Ambulation/Gait  Yes    Ambulation/Gait Assistance  5: Supervision;4: Min guard    Ambulation/Gait Assistance Details  Cues for staying close to rollator, for improved step length and heelstrike    Ambulation Distance (Feet)  200 Feet    Assistive device  Rollator    Gait Pattern  Step-through pattern;Decreased step length - right;Decreased step length - left;Decreased dorsiflexion - right;Decreased dorsiflexion - left;Poor foot clearance - left;Poor foot clearance - right;Festinating    Ambulation Surface  Level;Indoor    Pre-Gait Activities  Standing at locked rollator walker:  marching in place, forward step and weightshift x 10 (with focus on heelstrike), side step and weightshift x 10 reps, back step and weightshift x 10 reps, all for improved amplitude of movement for foot clearance    Gait Comments  Worked on turns, including turns in hallway and figure-8 turns.  Encouraged patient to make wider walking turns to avoid festinating; pt has difficulty with turns to  R.  For turning to prepare to sit, focus on wide "U" turn with wide walking turn fully squared up to seat, then pause prior to taking deliberate backwards steps until legs reaching mat.  Practiced multiple reps throughout session, with pt having some success with slower movement patterns.               PT Short Term Goals - 02/05/18 2120      PT SHORT TERM GOAL #1   Title  Pt will be independent with HEP for improved balance, transfers and gait.  Updated TARGET 03/01/18    Time  4    Period  Weeks    Status  On-going    Target Date  03/01/18      PT SHORT TERM GOAL #2   Title  Pt will improve TUG score to less than or equal to 50 seconds for decreased fall risk.    Time  4    Period  Weeks    Status   On-going    Target Date  03/01/18      PT SHORT TERM GOAL #3   Title  Pt wil perform at least 8 of 10 reps of sit<>stand transfers from 18 inch surfaces, with no posterior lean, independently for improved transfer efficiency and safety.    Time  4    Period  Weeks    Status  On-going    Target Date  03/01/18      PT SHORT TERM GOAL #4   Title  Pt will verbalize undersatnding of techniques to reduce freezing episodes with gait and turns    Time  4    Period  Weeks    Status  On-going    Target Date  03/01/18        PT Long Term Goals - 02/05/18 2122      PT LONG TERM GOAL #1   Title  Pt will verbalize understanding of fall prevention within the home environment.  TARGET 03/29/18    Time  8    Period  Weeks    Status  On-going    Target Date  03/29/18      PT LONG TERM GOAL #2   Title  Pt will improve TUG score to less than or equal to 35 seconds for decreased fall risk.    Time  8    Period  Weeks    Status  On-going    Target Date  03/29/18      PT LONG TERM GOAL #3   Title  Pt will improve gait velocity with conversation to at least 2.62 ft/sec for improved gait efficiency and safety.    Time  8    Period  Weeks    Status  On-going    Target Date  03/29/18      PT LONG TERM GOAL #4   Title  Pt will verbalize plans for continued community fitness upon d/c from PT.    Time  8    Status  On-going    Target Date  03/29/18            Plan - 02/13/18 1229    Clinical Impression Statement  Focused on pre-gait and gait/turning activities today, particularly with wider walking turns (to avoid narrow turns), also with practice backing up to a seat.  Pt has some success with this technique, but needs near constant reminder cues to slow pace of gait to perform correct technique for turns.  Pt will  continue to benefit from skilled PT to address gait and turns for improved balance and decreased fall risk.    Rehab Potential  Good    Clinical Impairments Affecting Rehab  Potential  for PT goals/tx plan    PT Frequency  2x / week    PT Duration  8 weeks plus eval    PT Treatment/Interventions  ADLs/Self Care Home Management;DME Instruction;Gait training;Functional mobility training;Therapeutic activities;Therapeutic exercise;Balance training;Patient/family education;Neuromuscular re-education    PT Next Visit Plan  pre-gait activities at walker (may consider adding to HEP), turning strategies-wide walking turns and backing up to sit    Consulted and Agree with Plan of Care  Patient       Patient will benefit from skilled therapeutic intervention in order to improve the following deficits and impairments:  Abnormal gait, Decreased balance, Decreased mobility, Difficulty walking, Decreased safety awareness, Decreased strength, Postural dysfunction  Visit Diagnosis: Other abnormalities of gait and mobility  Other symptoms and signs involving the nervous system     Problem List Patient Active Problem List   Diagnosis Date Noted  . Strain of right biceps 09/12/2017  . Lymphedema 02/10/2017  . BPH associated with nocturia 05/16/2016  . Senile purpura ([place]) 05/03/2016  . OSA (obstructive sleep apnea) 12/28/2015  . Hypersomnia 11/15/2015  . Cough 11/15/2015  . 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 Dizziness 12/30/2014  . Diastolic dysfunction 44/09/4740  . Former smoker 04/29/2014  . Chronic venous insufficiency 02/20/2014  . Hyperglycemia 08/02/2012  . Obesity 07/13/2010  . History of malignant melanoma. left leg 03/09/2009  . Insomnia 10/03/2007  . Hyperlipidemia 12/13/2006  . Essential hypertension 12/13/2006    Lister Brizzi W. 02/13/2018, 12:33 PM  [name]., PT   [facility] [address] [place], [place], [address] Phone: [phone]   Fax:  [phone]  Name: [name] MRN: [id number] Date of Birth: 06/27/1946

## 2018-02-14 ENCOUNTER — Encounter: Payer: Self-pay | Admitting: Physical Therapy

## 2018-02-14 ENCOUNTER — Ambulatory Visit: Payer: PPO | Admitting: Physical Therapy

## 2018-02-14 ENCOUNTER — Ambulatory Visit: Payer: PPO

## 2018-02-14 ENCOUNTER — Ambulatory Visit: Payer: PPO | Admitting: Occupational Therapy

## 2018-02-14 DIAGNOSIS — R41841 Cognitive communication deficit: Secondary | ICD-10-CM

## 2018-02-14 DIAGNOSIS — R2689 Other abnormalities of gait and mobility: Secondary | ICD-10-CM | POA: Diagnosis not present

## 2018-02-14 DIAGNOSIS — M6281 Muscle weakness (generalized): Secondary | ICD-10-CM

## 2018-02-14 DIAGNOSIS — R278 Other lack of coordination: Secondary | ICD-10-CM

## 2018-02-14 DIAGNOSIS — R1312 Dysphagia, oropharyngeal phase: Secondary | ICD-10-CM

## 2018-02-14 DIAGNOSIS — R471 Dysarthria and anarthria: Secondary | ICD-10-CM

## 2018-02-14 DIAGNOSIS — R29818 Other symptoms and signs involving the nervous system: Secondary | ICD-10-CM

## 2018-02-14 NOTE — Therapy (Signed)
[facility] [address] [place], [place], [address] Phone: [phone]   Fax:  [phone]  Occupational Therapy Treatment  Patient Details  Name: Nicholas [name] MRN: [id number] Date of Birth: [birth year] Referring Provider: [name]   Encounter Date: 02/14/2018  OT End of Session - 02/14/18 1545    Visit Number  5    Number of Visits  17    Date for OT Re-Evaluation  03/09/18    Authorization Type  [employer] (Medicare)    Authorization Time Period  cert.  01/08/18-04/08/18 week 1    OT Start Time  1533    OT Stop Time  1615    OT Time Calculation (min)  42 min    Activity Tolerance  Patient tolerated treatment well    Behavior During Therapy  WFL for tasks assessed/performed       Past Medical History:  Diagnosis Date  . 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 Hyperlipidemia   . Hypertension   . Lymphedema     Past Surgical History:  Procedure Laterality Date  . 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 removal of melanoma of left foot/amputation of toes 1&25 Jul 2006  . WRIST FRACTURE SURGERY     72 years old-set    There were no vitals filed for this visit.              Treatment: discussed safety for showering. Pt reports he is getting grab bars installed. Pt reports that he stands and props his leg to wash his legs. Therapist recommends use of a long handled sponge and that pt sits to bathe legs rather than standing on 1 foot. PWR! hands followed by tracing alphabet to reinforce printing large letter size. Pt then printed the alphabet using a felt tip pen with a foam grip, with good legibility and letter size, min v.,c followed by copying several sentences, min v.c for technique letter size and legibility noted. Pt continues to get smaller near end of sentence/ page. Standing, dynamic step and reach turning to each side min v.c for larger  amplitude movements. Ambulating while practicing turns and counting to minimize freezing , min v.c            OT Short Term Goals - 02/14/18 1540      OT SHORT TERM GOAL #1   Title  Pt will be independent with updated HEP.--check STGs 02/07/18    Status  On-going      OT SHORT TERM GOAL #2   Title  Pt will be able to write short paragraph and sign name with 100% legibility and only minimal decr in size use AE/strategies.    Status  On-going      OT SHORT TERM GOAL #3   Title  Pt will improve coordination for ADLs as shown by fastening/unfastening 3 buttons in 43sec or less.    Status  On-going      OT SHORT TERM GOAL #4   Title  Pt will demonstrate improved dynamic standing balance as eveidenced by performing 10 inches or greater on standing functional reach.        OT Long Term Goals - 01/09/18 0003      OT LONG TERM GOAL #1   Title  Pt will verbalize understanding of AE/strategies to incr safety, ease, and independence with ADLs/IADLs.--check LTGs 03/10/18    Time  8    Period  Weeks    Status  New      OT LONG TERM GOAL #2   Title  Pt will improve coordination for ADLs as shown by improving time on 9-hole peg test by at least 3sec bilaterally.    Baseline  R-33.69, L-30.69sec    Time  8    Period  Weeks    Status  New      OT LONG TERM GOAL #3   Title  Pt will improve coordination for ADLs as shown by fastening/unfastening 3 buttons in 40sec or less.    Baseline  47.37    Time  8    Period  Weeks    Status  New      OT LONG TERM GOAL #4   Title  Pt will improve ability to dress as shown by improving time on PPT#4 by at least 3 sec and incorporate trunk rotation to decr risk for shoulder injury.    Baseline  19.68sec with no trunk rotation    Time  8    Period  Weeks    Status  New      OT LONG TERM GOAL #5   Title  Pt will improve bradykinesia/hypokinesia for functional reaching/coordination for ADLs as shown by improving score on box and blocks test by  at least 3 with LUE.    Baseline  40 blocks    Time  8    Period  Weeks    Status  New            Plan - 02/14/18 1545    Clinical Impression Statement  Pt is progressing towards goals. He attended PWR! cycle today and he reports being exhausted afterwards.    Rehab Potential  Good    OT Frequency  2x / week    OT Duration  8 weeks    OT Treatment/Interventions  Self-care/ADL training;Therapeutic exercise;Visual/perceptual remediation/compensation;Patient/family education;Neuromuscular education;Moist Heat;Fluidtherapy;Energy conservation;Therapist, nutritional;Therapeutic activities;Balance training;Cognitive remediation/compensation;Passive range of motion;Manual Therapy;DME and/or AE instruction;Ultrasound;Contrast Bath;Cryotherapy    Plan  check short term goals next week, pt has not been seen to frquency due to scheduling.    Consulted and Agree with Plan of Care  Patient       Patient will benefit from skilled therapeutic intervention in order to improve the following deficits and impairments:  Decreased cognition, Decreased knowledge of use of DME, Impaired vision/preception, Decreased mobility, Decreased coordination, Decreased activity tolerance, Decreased range of motion, Impaired tone, Impaired UE functional use, Impaired perceived functional ability, Decreased safety awareness, Decreased knowledge of precautions, Decreased balance  Visit Diagnosis: No diagnosis found.    Problem List Patient Active Problem List   Diagnosis Date Noted  . Strain of right biceps 09/12/2017  . Lymphedema 02/10/2017  . BPH associated with nocturia 05/16/2016  . Senile purpura ([place]) 05/03/2016  . OSA (obstructive sleep apnea) 12/28/2015  . Hypersomnia 11/15/2015  . Cough 11/15/2015  . 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 Dizziness 12/30/2014  . Diastolic dysfunction 00/34/9179  . Former smoker 04/29/2014  . Chronic venous insufficiency 02/20/2014  . Hyperglycemia  08/02/2012  . Obesity 07/13/2010  . History of malignant melanoma. left leg 03/09/2009  . Insomnia 10/03/2007  . Hyperlipidemia 12/13/2006  . Essential hypertension 12/13/2006    Brayleigh Rybacki 02/14/2018, 3:51 PM  [facility] [address] [place], [place], [address] Phone: [phone]   Fax:  [phone]  Name: ROWDY GUERRINI MRN: [id number] Date  of Birth: [birth year]

## 2018-02-14 NOTE — Patient Instructions (Signed)
  Please complete the assigned speech therapy homework prior to your next session and return it to the speech therapist at your next visit.  

## 2018-02-14 NOTE — Therapy (Deleted)
[facility] [address] [place], [place], [address] Phone: [phone]   Fax:  [phone]  Patient Details  Name: Nicholas [name] MRN: [id number] Date of Birth: 07-May-1946 Referring Provider:  [name], MD  Encounter Date: 02/14/2018   [facility] 02/14/2018, 5:07 PM  [facility] [address] [place], [place], [address] Phone: [phone]   Fax:  [phone]

## 2018-02-14 NOTE — Therapy (Signed)
[facility] [address] [place], [place], [address] Phone: [phone]   Fax:  [phone]  Speech Language Pathology Treatment  Patient Details  Name: Nicholas [name] MRN: [id number] Date of Birth: 1946-04-23 Referring Provider: [name]    Encounter Date: 02/14/2018  End of Session - 02/14/18 1658    Visit Number  3    Number of Visits  17    Date for SLP Re-Evaluation  04/19/18    SLP Start Time  1450    SLP Stop Time   1530    SLP Time Calculation (min)  40 min    Activity Tolerance  Patient tolerated treatment well       Past Medical History:  Diagnosis Date  . Cancer [facility]) Jan 2008   skin; hx of melanoma left foot/ amputation of toes 1&2   . Hyperlipidemia   . Hypertension   . Lymphedema     Past Surgical History:  Procedure Laterality Date  . 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 removal of melanoma of left foot/amputation of toes 1&25 Jul 2006  . WRIST FRACTURE SURGERY     72 years old-set    There were no vitals filed for this visit.  Subjective Assessment - 02/14/18 1501    Currently in Pain?  No/denies            ADULT SLP TREATMENT - 02/14/18 1502      General Information   Behavior/Cognition  Alert;Cooperative;Pleasant mood      Treatment Provided   Treatment provided  Cognitive-Linquistic      Cognitive-Linquistic Treatment   Treatment focused on  Dysarthria    Skilled Treatment  "Your paperwork got wet. Wet like I threw it away." "I got a swallow test coming up Wednesday." Pt was engaged by SLP with tasks to improve conversational loudness. Loud /a/ averaged mid 90s dB. In sentence level responses pt with average upper 60s dB with usual min-mod A for loudness. Minimal carryover to simple conversation between stimuli or tasks. Pt aware of this approx 25% of the time.      Assessment / Recommendations / Plan   Plan  Continue with current plan of care      Progression  Toward Goals   Progression toward goals  Progressing toward goals         SLP Short Term Goals - 02/14/18 1659      SLP SHORT TERM GOAL #1   Title  pt will sustain loud /a/ at >90 dB at 30 cm over 3 consecutive sessions    Baseline  02-12-18, 02-14-18    Time  4    Period  Weeks    Status  On-going      SLP SHORT TERM GOAL #2   Title  pt will exhibit volume of 70dB in sentence responses 95% over 3 sessions    Time  4    Period  Weeks    Status  On-going      SLP SHORT TERM GOAL #3   Title  In 7 minutes simple conversation pt will achieve average speech volume of low 70s dB over two sessions    Time  4    Period  Weeks    Status  On-going      SLP SHORT TERM GOAL #4   Title  Pt will participate in objective assessment of swallow function (MBS or FEES) if warranted.    Time  4  Period  Weeks    Status  On-going      SLP SHORT TERM GOAL #5   Title  Pt will complete simple-mod complex naming tasks with 90% accuracy and rare min A     Time  4    Period  Weeks    Status  On-going       SLP Long Term Goals - 02/14/18 1700      SLP LONG TERM GOAL #1   Title  pt will sustain loud /a/ at average >90dB at 30 cm over 5 consecutive sessions    Baseline  02-12-18, 02-14-18    Time  8    Period  Weeks    Status  On-going      SLP LONG TERM GOAL #2   Title  pt will generate speech in 10 minutes mod complex conversation with average >70dB over three sessions    Time  8    Period  Weeks    Status  On-going      SLP LONG TERM GOAL #3   Title  Pt will tell SLP 3 s/sx of aspiration PNA.    Time  8    Period  Weeks    Status  On-going      SLP LONG TERM GOAL #4   Title  Pt will utilize compensations for anomia in structured tasks with rare min A over 4 sessions.     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 Pt reports awareness of hypokinetic dysarthria apprx 25% of the time. I recommend cont'd skilled ST to address dysarthria, cognitive-communication and dysphagia. SLP to monitor diet tolerance; will request MBS orders and schedule objective testing if warranted.     Speech Therapy Frequency  2x / week    Duration  -- 8 weeks or 16 additional visits    Treatment/Interventions  Aspiration precaution training;Diet toleration management by SLP;Trials of upgraded texture/liquids;Cognitive reorganization;Multimodal communcation approach;Compensatory strategies;Pharyngeal strengthening exercises;Language facilitation;Compensatory techniques;Cueing hierarchy;Internal/external aids;Functional tasks;SLP instruction and feedback;Patient/family education;Other (comment) MBS    Potential to Achieve Goals  Good    SLP Home Exercise Plan  loud /a/    Consulted and Agree with Plan of Care  Patient       Patient will benefit from skilled therapeutic intervention in order to improve the following deficits and impairments:   Dysarthria and anarthria  Cognitive communication deficit  Dysphagia, oropharyngeal phase    Problem List Patient Active Problem List   Diagnosis Date Noted  . Strain of right biceps 09/12/2017  . Lymphedema 02/10/2017  . BPH associated with nocturia 05/16/2016  . Senile purpura ([place]) 05/03/2016  . OSA (obstructive sleep apnea) 12/28/2015  . Hypersomnia 11/15/2015  . Cough 11/15/2015  . 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 Dizziness 12/30/2014  . Diastolic dysfunction 25/11/3974  . Former smoker 04/29/2014  . Chronic venous insufficiency 02/20/2014  . Hyperglycemia 08/02/2012  . Obesity 07/13/2010  . History of malignant melanoma. left leg 03/09/2009  . Insomnia 10/03/2007  . Hyperlipidemia 12/13/2006  . Essential hypertension 12/13/2006    [facility] ,[place], [place]  02/14/2018, 5:07 PM  [facility] [address] [place], [place], [address] Phone: [phone]   Fax:  [phone]   Name: Nicholas  DAQUON [name] MRN: [id number] Date of Birth: Aug 07, 1945

## 2018-02-15 NOTE — Therapy (Signed)
[facility] [address] [place], [place], [address] Phone: [phone]   Fax:  [phone]  Physical Therapy Treatment  Patient Details  Name: Nicholas [name] MRN: [id number] Date of Birth: [birth year] Referring Provider: [name]   Encounter Date: 02/14/2018  PT End of Session - 02/15/18 0854    Visit Number  5    Number of Visits  17    Date for PT Re-Evaluation  04/08/18    Authorization Type  [employer] (Medicare)-will need 10th visit progress note    Authorization Time Period  cert 11/17/04-2/37/62    PT Start Time  1404    PT Stop Time  1444    PT Time Calculation (min)  40 min    Activity Tolerance  Patient tolerated treatment well    Behavior During Therapy  [facility] for tasks assessed/performed       Past Medical History:  Diagnosis Date  . Cancer [facility]) Jan 2008   skin; hx of melanoma left foot/ amputation of toes 1&2   . Hyperlipidemia   . Hypertension   . Lymphedema     Past Surgical History:  Procedure Laterality Date  . 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 removal of melanoma of left foot/amputation of toes 1&25 Jul 2006  . WRIST FRACTURE SURGERY     72 years old-set    There were no vitals filed for this visit.  Subjective Assessment - 02/14/18 1410    Subjective  Had spin class today and I'm "whipped."  Legs just feel like they weigh 200 lbs.    Pertinent History  Lymphedema-has compression machine and uses stockings (wife helps); hx of PD/Parkinson's Plus Syndrone; malignant melanoma, hyperlipidemia, HTN, insomnia, obesity, hyperglycemia, dizziness, OSA, urinary urgency    Patient Stated Goals  Pt's goals for therapy are to "be like I used to be."    Currently in Pain?  No/denies                       [facility] Adult PT Treatment/Exercise - 02/14/18 1410      Transfers   Transfers  Sit to Stand;Stand to Sit    Sit to Stand  5: Supervision;With upper extremity  assist;From chair/3-in-1    Stand to Sit  5: Supervision;4: Min guard;With upper extremity assist;To chair/3-in-1    Number of Reps  Other reps (comment) x 5 reps throughout session      Ambulation/Gait   Ambulation/Gait  Yes    Ambulation/Gait Assistance  5: Supervision;4: Min guard    Ambulation/Gait Assistance Details  Cues for staying close to rollator, for increased step length, for     Ambulation Distance (Feet)  80 Feet lobby to gym area, then 80 ft to speech room end of session    Assistive device  Rollator    Gait Pattern  Step-through pattern;Decreased step length - right;Decreased step length - left;Decreased dorsiflexion - right;Decreased dorsiflexion - left;Poor foot clearance - left;Poor foot clearance - right;Festinating    Ambulation Surface  Level;Indoor    Pre-Gait Activities  Standing at locked walker, attempted x 2:  marching in place 2 sets x 10 reps, forward step taps x 5 reps each legs.  Both trials:  pt requests to sit due to fatigue.      Exercises   Exercises  Knee/Hip      Knee/Hip Exercises: Stretches   Active Hamstring Stretch  Right;Left;3 reps;30 seconds Seated foot propped  4" block    Gastroc Stretch  Right;Left;2 reps;30 seconds seated with sheet with assistance for placement    Gastroc Stretch Limitations  Standing runner's gastroc stretch 2 reps at locked walker, 30 seconds.    Other Knee/Hip Stretches  Standing hamstring stretch at locked walker, with forward/back/weightshift through hips-pt c/o feeling dizzy with these standing stretches.          Self Care:  BP measures in sitting:  121/72, standing 119/83. At end of session, BP measure in sitting 111/73. Discussed importance of stretching and exercise, rationale of stretching during today's session due to fatigue following Spin cycling session.   PT Education - 02/15/18 [phone]    Education Details  Assessment of BP throughout session (pt reports numbers are lower than normal); discussed excess  fatigue limiting mobility following Spin class (that should not be the expected response) and need to perhaps hold on Spin classes until therapy course has been completed.    Person(s) Educated  Patient    Methods  Explanation    Comprehension  Verbalized understanding       PT Short Term Goals - 02/05/18 2120      PT SHORT TERM GOAL #1   Title  Pt will be independent with HEP for improved balance, transfers and gait.  Updated TARGET 03/01/18    Time  4    Period  Weeks    Status  On-going    Target Date  03/01/18      PT SHORT TERM GOAL #2   Title  Pt will improve TUG score to less than or equal to 50 seconds for decreased fall risk.    Time  4    Period  Weeks    Status  On-going    Target Date  03/01/18      PT SHORT TERM GOAL #3   Title  Pt wil perform at least 8 of 10 reps of sit<>stand transfers from 18 inch surfaces, with no posterior lean, independently for improved transfer efficiency and safety.    Time  4    Period  Weeks    Status  On-going    Target Date  03/01/18      PT SHORT TERM GOAL #4   Title  Pt will verbalize undersatnding of techniques to reduce freezing episodes with gait and turns    Time  4    Period  Weeks    Status  On-going    Target Date  03/01/18        PT Long Term Goals - 02/05/18 2122      PT LONG TERM GOAL #1   Title  Pt will verbalize understanding of fall prevention within the home environment.  TARGET 03/29/18    Time  8    Period  Weeks    Status  On-going    Target Date  03/29/18      PT LONG TERM GOAL #2   Title  Pt will improve TUG score to less than or equal to 35 seconds for decreased fall risk.    Time  8    Period  Weeks    Status  On-going    Target Date  03/29/18      PT LONG TERM GOAL #3   Title  Pt will improve gait velocity with conversation to at least 2.62 ft/sec for improved gait efficiency and safety.    Time  8    Period  Weeks    Status  On-going  Target Date  03/29/18      PT LONG TERM GOAL #4    Title  Pt will verbalize plans for continued community fitness upon d/c from PT.    Time  8    Status  On-going    Target Date  03/29/18            Plan - 02/15/18 1045    Clinical Impression Statement  Due to pt's fatigue and leg soreness after his spin class today, he does not tolerate standing exercises well today.  He requests to sit multiple times during the session due to fatigue.  Attempted lower extremity stretching, but pt very easily distractible during PT session.  Pt will continue to benefit from skilled PT to address gait and pre-gait, turning activities.    Rehab Potential  Good    Clinical Impairments Affecting Rehab Potential  for PT goals/tx plan    PT Frequency  2x / week    PT Duration  8 weeks plus eval    PT Treatment/Interventions  ADLs/Self Care Home Management;DME Instruction;Gait training;Functional mobility training;Therapeutic activities;Therapeutic exercise;Balance training;Patient/family education;Neuromuscular re-education    PT Next Visit Plan  pre-gait activities at walker (may consider adding to HEP), turning strategies-wide walking turns and backing up to sit    Consulted and Agree with Plan of Care  Patient       Patient will benefit from skilled therapeutic intervention in order to improve the following deficits and impairments:  Abnormal gait, Decreased balance, Decreased mobility, Difficulty walking, Decreased safety awareness, Decreased strength, Postural dysfunction  Visit Diagnosis: Other abnormalities of gait and mobility  Muscle weakness (generalized)     Problem List Patient Active Problem List   Diagnosis Date Noted  . Strain of right biceps 09/12/2017  . Lymphedema 02/10/2017  . BPH associated with nocturia 05/16/2016  . Senile purpura ([place]) 05/03/2016  . OSA (obstructive sleep apnea) 12/28/2015  . Hypersomnia 11/15/2015  . Cough 11/15/2015  . 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 Dizziness 12/30/2014  . Diastolic  dysfunction 63/84/5364  . Former smoker 04/29/2014  . Chronic venous insufficiency 02/20/2014  . Hyperglycemia 08/02/2012  . Obesity 07/13/2010  . History of malignant melanoma. left leg 03/09/2009  . Insomnia 10/03/2007  . Hyperlipidemia 12/13/2006  . Essential hypertension 12/13/2006    Rut [place] W. 02/15/2018, 10:55 AM  [name]., PT   [facility] [address] [place], [place], [address] Phone: [phone]   Fax:  [phone]  Name: DAIMIEN PATMON MRN: [id number] Date of Birth: 02/19/1946

## 2018-02-19 ENCOUNTER — Ambulatory Visit: Payer: PPO | Admitting: Physical Therapy

## 2018-02-19 ENCOUNTER — Encounter: Payer: PPO | Admitting: Occupational Therapy

## 2018-02-19 DIAGNOSIS — N3941 Urge incontinence: Secondary | ICD-10-CM | POA: Diagnosis not present

## 2018-02-19 DIAGNOSIS — N401 Enlarged prostate with lower urinary tract symptoms: Secondary | ICD-10-CM | POA: Diagnosis not present

## 2018-02-20 ENCOUNTER — Ambulatory Visit (HOSPITAL_COMMUNITY)
Admission: RE | Admit: 2018-02-20 | Discharge: 2018-02-20 | Disposition: A | Discharge status: Home / Self Care | Payer: PPO | Source: Ambulatory Visit | Attending: Neurology | Admitting: Neurology

## 2018-02-20 DIAGNOSIS — G20 Parkinson's disease: Secondary | ICD-10-CM | POA: Diagnosis not present

## 2018-02-20 DIAGNOSIS — R1310 Dysphagia, unspecified: Secondary | ICD-10-CM | POA: Diagnosis not present

## 2018-02-20 DIAGNOSIS — R1312 Dysphagia, oropharyngeal phase: Secondary | ICD-10-CM

## 2018-02-20 NOTE — Progress Notes (Signed)
Objective Swallowing Evaluation: Type of Study: MBS-Modified Barium Swallow Study   Patient Details  Name: Nicholas [name] MRN: [id number] Date of Birth: 1945/12/19  Today's Date: 02/20/2018 Time: SLP Start Time (ACUTE ONLY): 1325 -SLP Stop Time (ACUTE ONLY): 1350  SLP Time Calculation (min) (ACUTE ONLY): 25 min   Past Medical History:  Past Medical History:  Diagnosis Date  . 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 Hyperlipidemia   . Hypertension   . Lymphedema    Past Surgical History:  Past Surgical History:  Procedure Laterality Date  . 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 removal of melanoma of left foot/amputation of toes 1&25 Jul 2006  . WRIST FRACTURE SURGERY     72 years old-set   HPI: hx of PD/Parkinson's Plus Syndrone; malignant melanoma, hyperlipidemia, HTN, insomnia, obesity, hyperglycemia, dizziness, OSA, urinary urgency, lymphedema. Known to ST from prior courses, most recently 02/06/17-03/16/17.  MRI from 2016 showed normal brain, diffuse mucosal disease t/o paranasal sinuses.  Pt admits to some dysphagia only with foods sensing they are lodging in pharynx approximaetly once every quarter.  Pt states if he drinks liquids his "throat fills up like a swimming pool".  Pt denies weight loss nor pneumonias.  He states his [name] was diagnosed approximately 2 1/2 year ago.     Subjective: pt awake in chair    Assessment / Plan / Recommendation  CHL IP CLINICAL IMPRESSIONS 02/20/2018  Clinical Impression Pt presents with functional oropharyngeal swallow ability without aspiration or penetration of any consistency tested.  His swallow was strong and timely.  Only difficulty was with barium tablet that momentarily lodged at vallecular space.  He required extra liquid to transit it into esophagus.     Pt barium tablet appeared to lodge in esophagu without pt sensation.  Extra liquid helped clearance - after liquid consumption,  pt reports he sensed clearance of pill.  Pt reports he takes pills without  liquids at times.    SLP Visit Diagnosis Dysphagia, unspecified (R13.10)  Attention and concentration deficit following --  Frontal lobe and executive function deficit following --  Impact on safety and function Mild aspiration risk      No flowsheet data found.   No flowsheet data found.  CHL IP DIET RECOMMENDATION 02/20/2018  SLP Diet Recommendations Regular solids;Thin liquid  Liquid Administration via Straw  Medication Administration Whole meds with liquid  Compensations (No Data)  Postural Changes Remain semi-upright after after feeds/meals (Comment);Seated upright at 90 degrees      CHL IP OTHER RECOMMENDATIONS 02/20/2018  Recommended Consults --  Oral Care Recommendations Oral care BID  Other Recommendations --      CHL IP FOLLOW UP RECOMMENDATIONS 02/20/2018  Follow up Recommendations Outpatient SLP      No flowsheet data found.         CHL IP ORAL PHASE 02/20/2018  Oral Phase WFL  Oral - Pudding Teaspoon --  Oral - Pudding Cup --  Oral - Honey Teaspoon --  Oral - Honey Cup --  Oral - Nectar Teaspoon --  Oral - Nectar Cup --  Oral - Nectar Straw WFL  Oral - Thin Teaspoon --  Oral - Thin Cup WFL  Oral - Thin Straw WFL  Oral - Puree WFL  Oral - Mech Soft --  Oral - Regular WFL  Oral - Multi-Consistency --  Oral - Pill Reduced posterior propulsion  Oral  Phase - Comment --    CHL IP PHARYNGEAL PHASE 02/20/2018  Pharyngeal Phase WFL  Pharyngeal- Pudding Teaspoon --  Pharyngeal --  Pharyngeal- Pudding Cup --  Pharyngeal --  Pharyngeal- Honey Teaspoon --  Pharyngeal --  Pharyngeal- Honey Cup --  Pharyngeal --  Pharyngeal- Nectar Teaspoon --  Pharyngeal --  Pharyngeal- Nectar Cup WFL  Pharyngeal --  Pharyngeal- Nectar Straw --  Pharyngeal --  Pharyngeal- Thin Teaspoon --  Pharyngeal --  Pharyngeal- Thin Cup WFL  Pharyngeal --  Pharyngeal- Thin Straw WFL  Pharyngeal --   Pharyngeal- Puree WFL  Pharyngeal --  Pharyngeal- Mechanical Soft --  Pharyngeal --  Pharyngeal- Regular WFL  Pharyngeal --  Pharyngeal- Multi-consistency --  Pharyngeal --  Pharyngeal- Pill Reduced tongue base retraction;Pharyngeal residue - valleculae  Pharyngeal --  Pharyngeal Comment --     CHL IP CERVICAL ESOPHAGEAL PHASE 02/20/2018  Cervical Esophageal Phase WFL  Pudding Teaspoon --  Pudding Cup --  Honey Teaspoon --  Honey Cup --  Nectar Teaspoon --  Nectar Cup --  Nectar Straw --  Thin Teaspoon --  Thin Cup --  Thin Straw --  Puree --  Mechanical Soft --  Regular --  Multi-consistency --  Pill --  Cervical Esophageal Comment --     Macario Golds 02/20/2018, 2:13 PM   Luanna Salk, [place] [facility] SLP [phone]

## 2018-02-22 ENCOUNTER — Ambulatory Visit: Payer: PPO

## 2018-02-22 ENCOUNTER — Encounter: Payer: Self-pay | Admitting: Physical Therapy

## 2018-02-22 ENCOUNTER — Ambulatory Visit: Payer: PPO | Attending: Family Medicine | Admitting: Physical Therapy

## 2018-02-22 DIAGNOSIS — R1312 Dysphagia, oropharyngeal phase: Secondary | ICD-10-CM | POA: Insufficient documentation

## 2018-02-22 DIAGNOSIS — R41841 Cognitive communication deficit: Secondary | ICD-10-CM | POA: Diagnosis not present

## 2018-02-22 DIAGNOSIS — R29898 Other symptoms and signs involving the musculoskeletal system: Secondary | ICD-10-CM | POA: Diagnosis not present

## 2018-02-22 DIAGNOSIS — M6281 Muscle weakness (generalized): Secondary | ICD-10-CM | POA: Insufficient documentation

## 2018-02-22 DIAGNOSIS — R29818 Other symptoms and signs involving the nervous system: Secondary | ICD-10-CM | POA: Diagnosis not present

## 2018-02-22 DIAGNOSIS — R278 Other lack of coordination: Secondary | ICD-10-CM | POA: Insufficient documentation

## 2018-02-22 DIAGNOSIS — R2681 Unsteadiness on feet: Secondary | ICD-10-CM | POA: Diagnosis not present

## 2018-02-22 DIAGNOSIS — R471 Dysarthria and anarthria: Secondary | ICD-10-CM | POA: Diagnosis not present

## 2018-02-22 DIAGNOSIS — R2689 Other abnormalities of gait and mobility: Secondary | ICD-10-CM

## 2018-02-22 NOTE — Therapy (Signed)
[facility] [address] [place], [place], [address] Phone: [phone]   Fax:  [phone]  Speech Language Pathology Treatment  Patient Details  Name: Nicholas [name] MRN: [id number] Date of Birth: 11/10/1945 Referring Provider: [name]    Encounter Date: 02/22/2018  End of Session - 02/22/18 1644    Visit Number  4    Number of Visits  17    Date for SLP Re-Evaluation  04/19/18    SLP Start Time  1319    SLP Stop Time   1400    SLP Time Calculation (min)  41 min    Activity Tolerance  Patient tolerated treatment well       Past Medical History:  Diagnosis Date  . Cancer [facility]) Jan 2008   skin; hx of melanoma left foot/ amputation of toes 1&2   . Hyperlipidemia   . Hypertension   . Lymphedema     Past Surgical History:  Procedure Laterality Date  . 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 removal of melanoma of left foot/amputation of toes 1&25 Jul 2006  . WRIST FRACTURE SURGERY     72 years old-set    There were no vitals filed for this visit.  Subjective Assessment - 02/22/18 1324    Subjective  Pt freezing on the way into ST room. Cues for marching and cadence assisted pt. "I passed my swallow test!" (pt with documented WNL oropharyngeal swallow)    Currently in Pain?  No/denies            ADULT SLP TREATMENT - 02/22/18 1325      General Information   Behavior/Cognition  Alert;Cooperative;Pleasant mood      Treatment Provided   Treatment provided  Cognitive-Linquistic      Pain Assessment   Pain Assessment  No/denies pain      Cognitive-Linquistic Treatment   Treatment focused on  Dysarthria    Skilled Treatment  SLP brieflyi discussed pt's modified barium swallow exam; suggested pills in appleasauce prior to pt tstating he takes meds wihtout water mostly. SLP recommended water with meds. Pt was engaged by SLP with tasks to improve conversational loudness. Loud /a/ averaged mid 90s  dB. In sentence level responses pt with average low 70s with cues to self correct. Minimal carryover again seen in simple conversation between stimuli or tasks. Pt rarely attempted self correction with structured tasks or in conversation.       Assessment / Recommendations / Plan   Plan  Continue with current plan of care      Progression Toward Goals   Progression toward goals  Progressing toward goals         SLP Short Term Goals - 02/22/18 1646      SLP SHORT TERM GOAL #1   Title  pt will sustain loud /a/ at >90 dB at 30 cm over 3 consecutive sessions    Status  Achieved      SLP SHORT TERM GOAL #2   Title  pt will exhibit volume of 70dB in sentence responses 95% over 3 sessions    Time  3    Period  Weeks    Status  On-going      SLP SHORT TERM GOAL #3   Title  In 7 minutes simple conversation pt will achieve average speech volume of low 70s dB over two sessions    Time  3    Period  Weeks  Status  On-going      SLP SHORT TERM GOAL #4   Title  Pt will participate in objective assessment of swallow function (MBS or FEES) if warranted.    Status  Achieved      SLP SHORT TERM GOAL #5   Title  Pt will complete simple-mod complex naming tasks with 90% accuracy and rare min A     Time  3    Period  Weeks    Status  On-going       SLP Long Term Goals - 02/22/18 1647      SLP LONG TERM GOAL #1   Title  pt will sustain loud /a/ at average >90dB at 30 cm over 5 consecutive sessions    Baseline  02-12-18, 02-14-18, 02-22-18    Time  7    Period  Weeks    Status  On-going      SLP LONG TERM GOAL #2   Title  pt will generate speech in 10 minutes mod complex conversation with average >70dB over three sessions    Time  7    Period  Weeks    Status  On-going      SLP LONG TERM GOAL #3   Title  Pt will tell SLP 3 s/sx of aspiration PNA.    Time  7    Period  Weeks    Status  On-going      SLP LONG TERM GOAL #4   Title  Pt will utilize compensations for anomia in  structured tasks with rare min A over 4 sessions.     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 Pt with limited self correction today. Pt was x3 asked to restate a quiet statement and restated at same volume prevoiusl. I recommend cont'd skilled ST to address dysarthria, cognitive-communication and dysphagia. SLP to monitor diet tolerance; will request MBS orders and schedule objective testing if warranted.     Speech Therapy Frequency  2x / week    Duration  -- 8 weeks or 16 additional visits    Treatment/Interventions  Aspiration precaution training;Diet toleration management by SLP;Trials of upgraded texture/liquids;Cognitive reorganization;Multimodal communcation approach;Compensatory strategies;Pharyngeal strengthening exercises;Language facilitation;Compensatory techniques;Cueing hierarchy;Internal/external aids;Functional tasks;SLP instruction and feedback;Patient/family education;Other (comment) MBS    Potential to Achieve Goals  Good    SLP Home Exercise Plan  loud /a/    Consulted and Agree with Plan of Care  Patient       Patient will benefit from skilled therapeutic intervention in order to improve the following deficits and impairments:   Dysarthria and anarthria  Dysphagia, oropharyngeal phase  Cognitive communication deficit    Problem List Patient Active Problem List   Diagnosis Date Noted  . Strain of right biceps 09/12/2017  . Lymphedema 02/10/2017  . BPH associated with nocturia 05/16/2016  . Senile purpura ([place]) 05/03/2016  . OSA (obstructive sleep apnea) 12/28/2015  . Hypersomnia 11/15/2015  . Cough 11/15/2015  . 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 Dizziness 12/30/2014  . Diastolic dysfunction 54/00/8676  . Former smoker 04/29/2014  . Chronic venous  insufficiency 02/20/2014  . Hyperglycemia 08/02/2012  . Obesity 07/13/2010  . History of malignant melanoma. left leg 03/09/2009  . Insomnia 10/03/2007  . Hyperlipidemia 12/13/2006  .  Essential hypertension 12/13/2006    [facility] ,[place], [place]  02/22/2018, 4:48 PM  [facility] [address] [place], [place], [address] Phone: [phone]   Fax:  [phone]   Name: Nicholas [name] MRN: [id number] Date of Birth: April 04, 1946

## 2018-02-22 NOTE — Therapy (Signed)
[facility] [address] [place], [place], [address] Phone: [phone]   Fax:  [phone]  Physical Therapy Treatment  Patient Details  Name: Nicholas [name] MRN: [id number] Date of Birth: 1946-05-16 Referring Provider: [name]   Encounter Date: 02/22/2018  PT End of Session - 02/22/18 1616    Visit Number  6    Number of Visits  17    Date for PT Re-Evaluation  04/08/18    Authorization Type  [employer] (Medicare)-will need 10th visit progress note    Authorization Time Period  cert 03/07/47-1/85/63    PT Start Time  1404    PT Stop Time  1442    PT Time Calculation (min)  38 min    Activity Tolerance  Patient tolerated treatment well    Behavior During Therapy  [facility] for tasks assessed/performed       Past Medical History:  Diagnosis Date  . Cancer [facility]) Jan 2008   skin; hx of melanoma left foot/ amputation of toes 1&2   . Hyperlipidemia   . Hypertension   . Lymphedema     Past Surgical History:  Procedure Laterality Date  . 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 removal of melanoma of left foot/amputation of toes 1&25 Jul 2006  . WRIST FRACTURE SURGERY     72 years old-set    There were no vitals filed for this visit.  Subjective Assessment - 02/22/18 1355    Subjective  I'm fine today. I passed my swallow test. I had a bad episode of freezing when I went to leave the restaurant and it was pouring rain. The rain made my freezing worse. Nothing I tried helped     Pertinent History  Lymphedema-has compression machine and uses stockings (wife helps); hx of PD/Parkinson's Plus Syndrone; malignant melanoma, hyperlipidemia, HTN, insomnia, obesity, hyperglycemia, dizziness, OSA, urinary urgency    Patient Stated Goals  Pt's goals for therapy are to "be like I used to be."    Currently in Pain?  No/denies                      Treatment-    PWR [facility]) - 02/22/18 1437    PWR! Up  20     PWR! Rock  10    PWR! Twist  10    PWR Step  10    Comments  min cues for technique (esp foot placement)       Pre-gait and gait training addressing freezing and how to re-start after he stops (he does consistently remember to stop when freezing begins) however he does not then recall the next step (wt-shifting and then big step seems to work best for him). Also worked on backwards walking and turning to approach the chair where he plans to sit. Requires min cues to plan his approach and make a wide turn when possible.   PT Education - 02/22/18 1615    Education Details  revisited tips to avoid freezing; pt reports he has most success with stopping and then trying BIG step, however does not recall to use wt-shifting prior to stepping    Person(s) Educated  Patient    Methods  Explanation;Demonstration    Comprehension  Verbalized understanding;Returned demonstration;Need further instruction       PT Short Term Goals - 02/05/18 2120      PT SHORT TERM GOAL #1   Title  Pt will be independent  with HEP for improved balance, transfers and gait.  Updated TARGET 03/01/18    Time  4    Period  Weeks    Status  On-going    Target Date  03/01/18      PT SHORT TERM GOAL #2   Title  Pt will improve TUG score to less than or equal to 50 seconds for decreased fall risk.    Time  4    Period  Weeks    Status  On-going    Target Date  03/01/18      PT SHORT TERM GOAL #3   Title  Pt wil perform at least 8 of 10 reps of sit<>stand transfers from 18 inch surfaces, with no posterior lean, independently for improved transfer efficiency and safety.    Time  4    Period  Weeks    Status  On-going    Target Date  03/01/18      PT SHORT TERM GOAL #4   Title  Pt will verbalize undersatnding of techniques to reduce freezing episodes with gait and turns    Time  4    Period  Weeks    Status  On-going    Target Date  03/01/18        PT Long Term Goals - 02/05/18 2122      PT LONG TERM GOAL  #1   Title  Pt will verbalize understanding of fall prevention within the home environment.  TARGET 03/29/18    Time  8    Period  Weeks    Status  On-going    Target Date  03/29/18      PT LONG TERM GOAL #2   Title  Pt will improve TUG score to less than or equal to 35 seconds for decreased fall risk.    Time  8    Period  Weeks    Status  On-going    Target Date  03/29/18      PT LONG TERM GOAL #3   Title  Pt will improve gait velocity with conversation to at least 2.62 ft/sec for improved gait efficiency and safety.    Time  8    Period  Weeks    Status  On-going    Target Date  03/29/18      PT LONG TERM GOAL #4   Title  Pt will verbalize plans for continued community fitness upon d/c from PT.    Time  8    Status  On-going    Target Date  03/29/18            Plan - 02/22/18 1617    Clinical Impression Statement  Initially focused on his freezing episodes and again practiced techniques for ending freeze and progressing gait. He does well with wt-shifting and then stepping (especially if he focuses on left foot taking initial step) however he does not independently recall to weight shift. Additionally worked on stepping to turn and back up to sit. Re-instructed in PWR! standing exercises (vc and demonstration for technique) and discussed resuming doing these each morning. Patient due to check STGs next week.     Rehab Potential  Good    Clinical Impairments Affecting Rehab Potential  for PT goals/tx plan    PT Frequency  2x / week    PT Duration  8 weeks plus eval    PT Treatment/Interventions  ADLs/Self Care Home Management;DME Instruction;Gait training;Functional mobility training;Therapeutic activities;Therapeutic exercise;Balance training;Patient/family education;Neuromuscular re-education  PT Next Visit Plan  did he do PWR! standing exercises; use PWR! standing for warm-up; pre-gait activities at walker (may consider adding to HEP), turning strategies-wide walking  turns and backing up to sit; continue working on reducing freezing    Consulted and Agree with Plan of Care  Patient       Patient will benefit from skilled therapeutic intervention in order to improve the following deficits and impairments:  Abnormal gait, Decreased balance, Decreased mobility, Difficulty walking, Decreased safety awareness, Decreased strength, Postural dysfunction  Visit Diagnosis: Other symptoms and signs involving the nervous system  Other abnormalities of gait and mobility     Problem List Patient Active Problem List   Diagnosis Date Noted  . Strain of right biceps 09/12/2017  . Lymphedema 02/10/2017  . BPH associated with nocturia 05/16/2016  . Senile purpura ([place]) 05/03/2016  . OSA (obstructive sleep apnea) 12/28/2015  . Hypersomnia 11/15/2015  . Cough 11/15/2015  . 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 Dizziness 12/30/2014  . Diastolic dysfunction 25/49/8264  . Former smoker 04/29/2014  . Chronic venous insufficiency 02/20/2014  . Hyperglycemia 08/02/2012  . Obesity 07/13/2010  . History of malignant melanoma. left leg 03/09/2009  . Insomnia 10/03/2007  . Hyperlipidemia 12/13/2006  . Essential hypertension 12/13/2006    Rexanne Mano, PT 02/22/2018, 4:23 PM  [facility] [address] [place], [place], [address] Phone: [phone]   Fax:  [phone]  Name: Nicholas [name] MRN: [id number] Date of Birth: [birth year]

## 2018-02-26 ENCOUNTER — Ambulatory Visit: Payer: PPO | Admitting: Physical Therapy

## 2018-02-26 ENCOUNTER — Ambulatory Visit: Payer: PPO | Admitting: Speech Pathology

## 2018-02-26 ENCOUNTER — Ambulatory Visit: Payer: PPO | Admitting: Occupational Therapy

## 2018-02-26 ENCOUNTER — Encounter: Payer: Self-pay | Admitting: Physical Therapy

## 2018-02-26 DIAGNOSIS — R29818 Other symptoms and signs involving the nervous system: Secondary | ICD-10-CM | POA: Diagnosis not present

## 2018-02-26 DIAGNOSIS — R278 Other lack of coordination: Secondary | ICD-10-CM

## 2018-02-26 DIAGNOSIS — M6281 Muscle weakness (generalized): Secondary | ICD-10-CM

## 2018-02-26 DIAGNOSIS — R2689 Other abnormalities of gait and mobility: Secondary | ICD-10-CM

## 2018-02-26 DIAGNOSIS — R471 Dysarthria and anarthria: Secondary | ICD-10-CM

## 2018-02-26 DIAGNOSIS — R2681 Unsteadiness on feet: Secondary | ICD-10-CM

## 2018-02-26 DIAGNOSIS — R29898 Other symptoms and signs involving the musculoskeletal system: Secondary | ICD-10-CM

## 2018-02-26 NOTE — Patient Instructions (Signed)
   Forward Weight Shift    Stand with upright posture at your walker or counter. Left leg in front of right. Shift weight forward onto left leg, straighten hip and knee. Shift weight back onto right leg.  __10_ reps per set, _1__ sets per day, __5_ days per week. Repeat with other leg.  Copyright  VHI. All rights reserved.

## 2018-02-26 NOTE — Patient Instructions (Signed)
  When someone says "What" - Take a big breath and Think Shout!!  Keep up the good work with homework  Go [employer]ves!  Hey [name]! What are we doing for dinner?  Kouts work?   [place] football going?  Do you watch [place]?  How are the Panthers doing?  I watch [place] and [place] basketball.  Any other common phrases you say or topics you talk about       Get the persons attention before you speak  Use eye contact and face the person you are speaking to  Be in close proximity to the person you are speaking to  Turn down any noise in the environment such as the TV, walk away from loud appliances, air conditioners, fans, dish washers etc

## 2018-02-26 NOTE — Therapy (Signed)
[facility] [address] [place], [place], [address] Phone: [phone]   Fax:  [phone]  Speech Language Pathology Treatment  Patient Details  Name: Nicholas [name] MRN: [id number] Date of Birth: 10/31/1945 Referring Provider: Dr. Wells Guiles [name]    Encounter Date: 02/26/2018  End of Session - 02/26/18 1235    SLP Start Time  1022    SLP Stop Time   1102    SLP Time Calculation (min)  40 min    Activity Tolerance  Patient tolerated treatment well       Past Medical History:  Diagnosis Date  . Cancer [facility]) Jan 2008   skin; hx of melanoma left foot/ amputation of toes 1&2   . Hyperlipidemia   . Hypertension   . Lymphedema     Past Surgical History:  Procedure Laterality Date  . 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 removal of melanoma of left foot/amputation of toes 1&25 Jul 2006  . WRIST FRACTURE SURGERY     72 years old-set    There were no vitals filed for this visit.  Subjective Assessment - 02/26/18 1026    Subjective  Pt has had med change for bladder, but does not not recall med name - he will bring it next visit"    Currently in Pain?  No/denies            ADULT SLP TREATMENT - 02/26/18 1028      General Information   Behavior/Cognition  Alert;Cooperative;Pleasant mood      Treatment Provided   Treatment provided  Cognitive-Linquistic      Cognitive-Linquistic Treatment   Treatment focused on  Dysarthria    Skilled Treatment  Pt reports he completed his homework per Nicholas [name]'s instructions and is doing loud /a/ in the car. Loud /a/ to recalibrate volume with average of 92dB with rare min A. In structured sentence level tasks (reading and spontaneous speech) pt averaged 70dB (68to 73dB) with occasional min A for volume. Conversation with usual min to mod visual and verbal cues with average of 67dB.      Assessment / Recommendations / Plan   Plan  Continue with current plan of care       Progression Toward Goals   Progression toward goals  Progressing toward goals         SLP Short Term Goals - 02/26/18 1204      SLP SHORT TERM GOAL #1   Title  pt will sustain loud /a/ at >90 dB at 30 cm over 3 consecutive sessions    Status  Achieved      SLP SHORT TERM GOAL #2   Title  pt will exhibit volume of 70dB in sentence responses 95% over 3 sessions    Baseline  02/26/18    Time  2    Period  Weeks    Status  On-going      SLP SHORT TERM GOAL #3   Title  In 7 minutes simple conversation pt will achieve average speech volume of low 70s dB over two sessions    Time  2    Period  Weeks    Status  On-going      SLP SHORT TERM GOAL #4   Title  Pt will participate in objective assessment of swallow function (MBS or FEES) if warranted.    Status  Achieved      SLP SHORT TERM GOAL #5   Title  Pt will  complete simple-mod complex naming tasks with 90% accuracy and rare min A     Time  3    Period  Weeks    Status  On-going       SLP Long Term Goals - 02/26/18 1205      SLP LONG TERM GOAL #1   Title  pt will sustain loud /a/ at average >90dB at 30 cm over 5 consecutive sessions    Baseline  02-12-18, 02-14-18, 02-22-18; 02/26/18;     Time  6    Period  Weeks    Status  On-going      SLP LONG TERM GOAL #2   Title  pt will generate speech in 10 minutes mod complex conversation with average >70dB over three sessions    Time  6    Period  Weeks    Status  On-going      SLP LONG TERM GOAL #3   Title  Pt will tell SLP 3 s/sx of aspiration PNA.    Time  6    Period  Weeks    Status  On-going      SLP LONG TERM GOAL #4   Title  Pt will utilize compensations for anomia in structured tasks with rare min A over 4 sessions.     Time  6    Period  Weeks    Status  On-going       Plan - 02/26/18 1201    Clinical Impression Statement  [name] continues with reduced intelligibility due to hypokinetic dysarthria. In structured tasks, he requires min A to average 70dB on  short phrases/sentences, however conversation continues with low volume and reduced intelligilbility with spouse, family and friends. Continue skilled ST to maximize intelligibility for improved QOL and to reduce pt and spouse frustration.     Speech Therapy Frequency  2x / week    Duration  -- 8 weeks or 17 visits    Treatment/Interventions  Aspiration precaution training;Diet toleration management by SLP;Trials of upgraded texture/liquids;Cognitive reorganization;Multimodal communcation approach;Compensatory strategies;Pharyngeal strengthening exercises;Language facilitation;Compensatory techniques;Cueing hierarchy;Internal/external aids;Functional tasks;SLP instruction and feedback;Patient/family education;Other (comment)    Potential to Achieve Goals  Good       Patient will benefit from skilled therapeutic intervention in order to improve the following deficits and impairments:   Dysarthria and anarthria    Problem List Patient Active Problem List   Diagnosis Date Noted  . Strain of right biceps 09/12/2017  . Lymphedema 02/10/2017  . BPH associated with nocturia 05/16/2016  . Senile purpura ([place]) 05/03/2016  . OSA (obstructive sleep apnea) 12/28/2015  . Hypersomnia 11/15/2015  . Cough 11/15/2015  . 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 Dizziness 12/30/2014  . Diastolic dysfunction 36/14/4315  . Former smoker 04/29/2014  . Chronic venous insufficiency 02/20/2014  . Hyperglycemia 08/02/2012  . Obesity 07/13/2010  . History of malignant melanoma. left leg 03/09/2009  . Insomnia 10/03/2007  . Hyperlipidemia 12/13/2006  . Essential hypertension 12/13/2006    Nicholas [name], Nicholas Rusk MS, [place] 02/26/2018, 12:36 PM  [facility] [address] [place], [place], [address] Phone: [phone]   Fax:  [phone]   Name: Nicholas [name] MRN: [id number] Date of Birth: 1945/12/06

## 2018-02-26 NOTE — Therapy (Signed)
[facility] [address] [place], [place], [address] Phone: [phone]   Fax:  [phone]  Occupational Therapy Treatment  Patient Details  Name: Nicholas [name] MRN: [id number] Date of Birth: 24-Mar-1946 Referring Provider: [name]   Encounter Date: 02/26/2018  OT End of Session - 02/26/18 1108    Visit Number  6    Number of Visits  17    Date for OT Re-Evaluation  03/09/18    Authorization Type  [employer] (Medicare)    Authorization Time Period  cert.  01/08/18-04/08/18 week 1    OT Start Time  1107    OT Stop Time  1145    OT Time Calculation (min)  38 min    Activity Tolerance  Patient tolerated treatment well    Behavior During Therapy  WFL for tasks assessed/performed       Past Medical History:  Diagnosis Date  . Cancer [facility]) Jan 2008   skin; hx of melanoma left foot/ amputation of toes 1&2   . Hyperlipidemia   . Hypertension   . Lymphedema     Past Surgical History:  Procedure Laterality Date  . 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 removal of melanoma of left foot/amputation of toes 1&25 Jul 2006  . WRIST FRACTURE SURGERY     72 years old-set    There were no vitals filed for this visit.  Subjective Assessment - 02/26/18 1307    Subjective   pt denies pain    Pertinent History  hx of PD/Parkinson's Plus Syndrone; malignant melanoma, hyperlipidemia, HTN, insomnia, obesity, hyperglycemia, dizziness, OSA, urinary urgency, lymphedema    Patient Stated Goals  improve handwriting    Currently in Pain?  No/denies            Treatment: PWR! Seated for PWR! Up, rock and twist, 10 reps each min v.c for elbow extension and positioning. Standing dynamic step and reach over targets to copy small peg design with left and right UE's , supervision- min A for balance, min v.c for stepping strategy and foot placement. Pt required several rest breaks during task. Removing pegs with in hand  manipulation.  Min v.c for navigating in small spaces and turing when walking from table to mat .                 OT Short Term Goals - 02/26/18 1108      OT SHORT TERM GOAL #1   Title  Pt will be independent with updated HEP.--check STGs 02/07/18    Status  Achieved      OT SHORT TERM GOAL #2   Title  Pt will be able to write short paragraph and sign name with 100% legibility and only minimal decr in size use AE/strategies.    Status  On-going      OT SHORT TERM GOAL #3   Title  Pt will improve coordination for ADLs as shown by fastening/unfastening 3 buttons in 43sec or less.    Status  On-going      OT SHORT TERM GOAL #4   Title  Pt will demonstrate improved dynamic standing balance as eveidenced by performing 10 inches or greater on standing functional reach.    Status  On-going        OT Long Term Goals - 01/09/18 0003      OT LONG TERM GOAL #1   Title  Pt will verbalize understanding of AE/strategies to incr  safety, ease, and independence with ADLs/IADLs.--check LTGs 03/10/18    Time  8    Period  Weeks    Status  New      OT LONG TERM GOAL #2   Title  Pt will improve coordination for ADLs as shown by improving time on 9-hole peg test by at least 3sec bilaterally.    Baseline  R-33.69, L-30.69sec    Time  8    Period  Weeks    Status  New      OT LONG TERM GOAL #3   Title  Pt will improve coordination for ADLs as shown by fastening/unfastening 3 buttons in 40sec or less.    Baseline  47.37    Time  8    Period  Weeks    Status  New      OT LONG TERM GOAL #4   Title  Pt will improve ability to dress as shown by improving time on PPT#4 by at least 3 sec and incorporate trunk rotation to decr risk for shoulder injury.    Baseline  19.68sec with no trunk rotation    Time  8    Period  Weeks    Status  New      OT LONG TERM GOAL #5   Title  Pt will improve bradykinesia/hypokinesia for functional reaching/coordination for ADLs as shown by improving  score on box and blocks test by at least 3 with LUE.    Baseline  40 blocks    Time  8    Period  Weeks    Status  New            Plan - 02/26/18 1308    Clinical Impression Statement  Pt is progressing towards goals. He demonstrates improved stepping over targets today during functional reaching activity.    Occupational performance deficits (Please refer to evaluation for details):  ADL's;IADL's;Leisure;Work;Rest and Sleep;Social Participation    Rehab Potential  Good    OT Frequency  2x / week    OT Duration  8 weeks    OT Treatment/Interventions  Self-care/ADL training;Therapeutic exercise;Visual/perceptual remediation/compensation;Patient/family education;Neuromuscular education;Moist Heat;Fluidtherapy;Energy conservation;Therapist, nutritional;Therapeutic activities;Balance training;Cognitive remediation/compensation;Passive range of motion;Manual Therapy;DME and/or AE instruction;Ultrasound;Contrast Bath;Cryotherapy    Plan  check short term goals next week, pt has not been seen to frquency due to scheduling ( Therapist did not check buttoning task due to pt bandaide on injured thumb)    Consulted and Agree with Plan of Care  Patient       Patient will benefit from skilled therapeutic intervention in order to improve the following deficits and impairments:  Decreased cognition, Decreased knowledge of use of DME, Impaired vision/preception, Decreased mobility, Decreased coordination, Decreased activity tolerance, Decreased range of motion, Impaired tone, Impaired UE functional use, Impaired perceived functional ability, Decreased safety awareness, Decreased knowledge of precautions, Decreased balance  Visit Diagnosis: Other symptoms and signs involving the nervous system  Other abnormalities of gait and mobility  Muscle weakness (generalized)  Other lack of coordination  Other symptoms and signs involving the musculoskeletal system  Unsteadiness on  feet    Problem List Patient Active Problem List   Diagnosis Date Noted  . Strain of right biceps 09/12/2017  . Lymphedema 02/10/2017  . BPH associated with nocturia 05/16/2016  . Senile purpura ([place]) 05/03/2016  . OSA (obstructive sleep apnea) 12/28/2015  . Hypersomnia 11/15/2015  . Cough 11/15/2015  . 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 Dizziness 12/30/2014  . Diastolic dysfunction 63/87/5643  .  Former smoker 04/29/2014  . Chronic venous insufficiency 02/20/2014  . Hyperglycemia 08/02/2012  . Obesity 07/13/2010  . History of malignant melanoma. left leg 03/09/2009  . Insomnia 10/03/2007  . Hyperlipidemia 12/13/2006  . Essential hypertension 12/13/2006    RINE,KATHRYN 02/26/2018, 1:11 PM  [facility] [address] [place], [place], [address] Phone: [phone]   Fax:  [phone]  Name: Nicholas [name] MRN: [id number] Date of Birth: March 14, 1946

## 2018-02-27 NOTE — Therapy (Signed)
[facility] [address] [place], [place], [address] Phone: [phone]   Fax:  [phone]  Physical Therapy Treatment  Patient Details  Name: Nicholas [name] MRN: [id number] Date of Birth: [birth year] Referring Provider: [name]   Encounter Date: 02/26/2018  PT End of Session - 02/26/18 1700    Visit Number  7    Number of Visits  17    Date for PT Re-Evaluation  04/08/18    Authorization Type  [employer] (Medicare)-will need 10th visit progress note    Authorization Time Period  cert 7/41/28-7/86/76    PT Start Time  1148    PT Stop Time  1235    PT Time Calculation (min)  47 min    Activity Tolerance  Patient tolerated treatment well    Behavior During Therapy  [place] for tasks assessed/performed       Past Medical History:  Diagnosis Date  . 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 Hyperlipidemia   . Hypertension   . Lymphedema     Past Surgical History:  Procedure Laterality Date  . 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 removal of melanoma of left foot/amputation of toes 1&25 Jul 2006  . WRIST FRACTURE SURGERY     72 years old-set    There were no vitals filed for this visit.  Subjective Assessment - 02/26/18 1151    Subjective  I'm still having freezing when trying to walk through a crowd of people or narrow spaces. Like in a restaurant.     Pertinent History  Lymphedema-has compression machine and uses stockings (wife helps); hx of PD/Parkinson's Plus Syndrone; malignant melanoma, hyperlipidemia, HTN, insomnia, obesity, hyperglycemia, dizziness, OSA, urinary urgency    Patient Stated Goals  Pt's goals for therapy are to "be like I used to be."                       [facility] Adult PT Treatment/Exercise - 02/26/18 1217      Ambulation/Gait   Ambulation/Gait  Yes    Ambulation/Gait Assistance  5: Supervision    Ambulation/Gait  Assistance Details  between the mats and stools (narrow spaces) which causes freezing; pt required cues to initiate rocking prior to trying to step to break the freeze.     Ambulation Distance (Feet)  25 Feet path repeated x 5; 100 x5    Assistive device  Rollator    Gait Pattern  Step-through pattern;Decreased step length - right;Decreased step length - left;Decreased dorsiflexion - right;Decreased dorsiflexion - left;Poor foot clearance - left;Poor foot clearance - right;Festinating    Ambulation Surface  Indoor    Pre-Gait Activities  at locked rollator, lateral and ant/post wt-shifiing 10 reps x 2 during session    Gait Comments  practiced walking through doorway and crowded spaces with looking forward at a target to diminish freezing; pt with severe episodes of freezing when session completed and trying to exit gym (every 3-4 feet) pt admits he was trying to think about hurrying to get home and all he needs to do today instead of focusing on his walking        [facility]) - 02/26/18 1212    PWR! exercises  Moves in sitting    PWR! Up  10    PWR! Rock  5    PWR! Twist  5  PWR Step  5    Comments  after completed PWR Up, OT came bY and said they did sitting PWR as warm-up for OT (pt did not recall doing this already today); repeated minimal reps of other seated exercises to see if pt could remember proper technique (which he did)          PT Education - 02/27/18 0804    Education Details  tips to avoid freezing (pt can verbalize, but requires cues in the moment to implement); updated HEP    Person(s) Educated  Patient    Methods  Explanation;Demonstration;Verbal cues;Handout    Comprehension  Verbalized understanding;Returned demonstration;Verbal cues required;Need further instruction       PT Short Term Goals - 02/05/18 2120      PT SHORT TERM GOAL #1   Title  Pt will be independent with HEP for improved balance, transfers and gait.  Updated TARGET 03/01/18    Time  4     Period  Weeks    Status  On-going    Target Date  03/01/18      PT SHORT TERM GOAL #2   Title  Pt will improve TUG score to less than or equal to 50 seconds for decreased fall risk.    Time  4    Period  Weeks    Status  On-going    Target Date  03/01/18      PT SHORT TERM GOAL #3   Title  Pt wil perform at least 8 of 10 reps of sit<>stand transfers from 18 inch surfaces, with no posterior lean, independently for improved transfer efficiency and safety.    Time  4    Period  Weeks    Status  On-going    Target Date  03/01/18      PT SHORT TERM GOAL #4   Title  Pt will verbalize undersatnding of techniques to reduce freezing episodes with gait and turns    Time  4    Period  Weeks    Status  On-going    Target Date  03/01/18        PT Long Term Goals - 02/05/18 2122      PT LONG TERM GOAL #1   Title  Pt will verbalize understanding of fall prevention within the home environment.  TARGET 03/29/18    Time  8    Period  Weeks    Status  On-going    Target Date  03/29/18      PT LONG TERM GOAL #2   Title  Pt will improve TUG score to less than or equal to 35 seconds for decreased fall risk.    Time  8    Period  Weeks    Status  On-going    Target Date  03/29/18      PT LONG TERM GOAL #3   Title  Pt will improve gait velocity with conversation to at least 2.62 ft/sec for improved gait efficiency and safety.    Time  8    Period  Weeks    Status  On-going    Target Date  03/29/18      PT LONG TERM GOAL #4   Title  Pt will verbalize plans for continued community fitness upon d/c from PT.    Time  8    Status  On-going    Target Date  03/29/18            Plan - 02/26/18 1700  Clinical Impression Statement  continue to focus on working through situations that cause freezing of gait with pt requiring max cues to rock prior to initiating a step (he does stop and take a breath without cues). Continued education in [place]! exercises and benefits particularly related  to function. Next session will check progress towards STGs.     Rehab Potential  Good    Clinical Impairments Affecting Rehab Potential  for PT goals/tx plan    PT Frequency  2x / week    PT Duration  8 weeks plus eval    PT Treatment/Interventions  ADLs/Self Care Home Management;DME Instruction;Gait training;Functional mobility training;Therapeutic activities;Therapeutic exercise;Balance training;Patient/family education;Neuromuscular re-education    PT Next Visit Plan  check STGs;  pre-gait activities at walker, obstacle courses simulating walking in restaurant; turning strategies-wide walking turns and backing up to sit; continue working on reducing freezing    Consulted and Agree with Plan of Care  Patient       Patient will benefit from skilled therapeutic intervention in order to improve the following deficits and impairments:  Abnormal gait, Decreased balance, Decreased mobility, Difficulty walking, Decreased safety awareness, Decreased strength, Postural dysfunction  Visit Diagnosis: Other symptoms and signs involving the nervous system  Other abnormalities of gait and mobility     Problem List Patient Active Problem List   Diagnosis Date Noted  . Strain of right biceps 09/12/2017  . Lymphedema 02/10/2017  . BPH associated with nocturia 05/16/2016  . Senile purpura ([place]) 05/03/2016  . OSA (obstructive sleep apnea) 12/28/2015  . Hypersomnia 11/15/2015  . Cough 11/15/2015  . 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 Dizziness 12/30/2014  . Diastolic dysfunction 70/07/7492  . Former smoker 04/29/2014  . Chronic venous insufficiency 02/20/2014  . Hyperglycemia 08/02/2012  . Obesity 07/13/2010  . History of malignant melanoma. left leg 03/09/2009  . Insomnia 10/03/2007  . Hyperlipidemia 12/13/2006  . Essential hypertension 12/13/2006    Rexanne Mano, PT 02/27/2018, 8:11 AM  [facility] [address] [place], [place], [address] Phone: [phone]   Fax:  [phone]  Name: Nicholas [name] MRN: [id number] Date of Birth: [birth year]

## 2018-02-28 ENCOUNTER — Encounter: Payer: Self-pay | Admitting: Occupational Therapy

## 2018-02-28 ENCOUNTER — Ambulatory Visit: Payer: PPO

## 2018-02-28 ENCOUNTER — Encounter: Payer: Self-pay | Admitting: Physical Therapy

## 2018-02-28 ENCOUNTER — Ambulatory Visit: Payer: PPO | Admitting: Occupational Therapy

## 2018-02-28 ENCOUNTER — Ambulatory Visit: Payer: PPO | Admitting: Physical Therapy

## 2018-02-28 DIAGNOSIS — R41841 Cognitive communication deficit: Secondary | ICD-10-CM

## 2018-02-28 DIAGNOSIS — R29898 Other symptoms and signs involving the musculoskeletal system: Secondary | ICD-10-CM

## 2018-02-28 DIAGNOSIS — R471 Dysarthria and anarthria: Secondary | ICD-10-CM

## 2018-02-28 DIAGNOSIS — R29818 Other symptoms and signs involving the nervous system: Secondary | ICD-10-CM | POA: Diagnosis not present

## 2018-02-28 DIAGNOSIS — R2689 Other abnormalities of gait and mobility: Secondary | ICD-10-CM

## 2018-02-28 DIAGNOSIS — R2681 Unsteadiness on feet: Secondary | ICD-10-CM

## 2018-02-28 DIAGNOSIS — R1312 Dysphagia, oropharyngeal phase: Secondary | ICD-10-CM

## 2018-02-28 DIAGNOSIS — R278 Other lack of coordination: Secondary | ICD-10-CM

## 2018-02-28 NOTE — Therapy (Signed)
[facility] [address] [place], [place], [address] Phone: [phone]   Fax:  [phone]  Speech Language Pathology Treatment  Patient Details  Name: Nicholas [name] MRN: [id number] Date of Birth: 08-05-1945 Referring Provider: [name]    Encounter Date: 02/28/2018  End of Session - 02/28/18 1237    Visit Number  6    Number of Visits  17    Date for SLP Re-Evaluation  04/19/18    SLP Start Time  0853    SLP Stop Time   0931    SLP Time Calculation (min)  38 min    Activity Tolerance  Patient tolerated treatment well       Past Medical History:  Diagnosis Date  . 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 Hyperlipidemia   . Hypertension   . Lymphedema     Past Surgical History:  Procedure Laterality Date  . 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 removal of melanoma of left foot/amputation of toes 1&25 Jul 2006  . WRIST FRACTURE SURGERY     72 years old-set    There were no vitals filed for this visit.  Subjective Assessment - 02/28/18 0858    Subjective  Pt bladder med is Tov[place].    Currently in Pain?  No/denies            ADULT SLP TREATMENT - 02/28/18 0859      General Information   Behavior/Cognition  Alert;Cooperative;Pleasant mood      Treatment Provided   Treatment provided  Cognitive-Linquistic      Cognitive-Linquistic Treatment   Treatment focused on  Dysarthria    Skilled Treatment  "It seems like every time I have trouble with loudness I'm thinking." SLP told pt we will work on making loudness more habitual. SLP utilized loud /a/ to recalibrate volume with average of low-mid 90s dB with rare min A for volume. In structured 2-sentence level tasks - pt averaged low-70s dB with usual min A - faded to rare min A for volume. Conversation with usual min to mod visual and verbal cues with average of upper 60s dB.SLP stressed to pt necessity  of performing BOTH loud /a/ and completing homework each day.      Assessment / Recommendations / Plan   Plan  Continue with current plan of care      Progression Toward Goals   Progression toward goals  Progressing toward goals         SLP Short Term Goals - 02/28/18 1239      SLP SHORT TERM GOAL #1   Title  pt will sustain loud /a/ at >90 dB at 30 cm over 3 consecutive sessions    Status  Achieved      SLP SHORT TERM GOAL #2   Title  pt will exhibit volume of 70dB in sentence responses 95% over 3 sessions    Baseline  02/26/18    Time  2    Period  Weeks    Status  On-going      SLP SHORT TERM GOAL #3   Title  In 7 minutes simple conversation pt will achieve average speech volume of low 70s dB over two sessions    Time  2    Period  Weeks    Status  On-going      SLP SHORT TERM GOAL #4  Title  Pt will participate in objective assessment of swallow function (MBS or FEES) if warranted.    Status  Achieved      SLP SHORT TERM GOAL #5   Title  Pt will complete simple-mod complex naming tasks with 90% accuracy and rare min A     Time  3    Period  Weeks    Status  On-going       SLP Long Term Goals - 02/28/18 1240      SLP LONG TERM GOAL #1   Title  pt will sustain loud /a/ at average >90dB at 30 cm over 5 consecutive sessions    Baseline  02-12-18, 02-14-18, 02-22-18; 02/26/18; 02-28-18    Time  6    Period  Weeks    Status  On-going      SLP LONG TERM GOAL #2   Title  pt will generate speech in 10 minutes mod complex conversation with average >70dB over three sessions    Time  6    Period  Weeks    Status  On-going      SLP LONG TERM GOAL #3   Title  Pt will tell SLP 3 s/sx of aspiration PNA.    Time  6    Period  Weeks    Status  On-going      SLP LONG TERM GOAL #4   Title  Pt will utilize compensations for anomia in structured tasks with rare min A over 4 sessions.     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 In structured tasks, he requires SLP A to average 70dB with sentence responses, however conversation continues with low volume and reduced intelligilbility with spouse, family and friends. Continue skilled ST to maximize intelligibility for improved QOL and to reduce pt and spouse frustration.     Speech Therapy Frequency  2x / week    Duration  --    Treatment/Interventions  Aspiration precaution training;Diet toleration management by SLP;Trials of upgraded texture/liquids;Cognitive reorganization;Multimodal communcation approach;Compensatory strategies;Pharyngeal strengthening exercises;Language facilitation;Compensatory techniques;Cueing hierarchy;Internal/external aids;Functional tasks;SLP instruction and feedback;Patient/family education;Other (comment)    Potential to Achieve Goals  Good       Patient will benefit from skilled therapeutic intervention in order to improve the following deficits and impairments:   Dysarthria and anarthria  Dysphagia, oropharyngeal phase  Cognitive communication deficit    Problem List Patient Active Problem List   Diagnosis Date Noted  . Strain of right biceps 09/12/2017  . Lymphedema 02/10/2017  . BPH associated with nocturia 05/16/2016  . Senile purpura ([employer]) 05/03/2016  . OSA (obstructive sleep apnea) 12/28/2015  . Hypersomnia 11/15/2015  . Cough 11/15/2015  . 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 Dizziness 12/30/2014  . Diastolic dysfunction 26/83/4196  . Former smoker 04/29/2014  . Chronic venous insufficiency 02/20/2014  . Hyperglycemia 08/02/2012  . Obesity 07/13/2010  . History of malignant melanoma. left leg 03/09/2009  . Insomnia 10/03/2007  . Hyperlipidemia 12/13/2006  . Essential hypertension 12/13/2006    [facility] ,[place], [place]  02/28/2018, 12:41 PM  [place] [address] [place], [place], [address] Phone: [phone]   Fax:  [phone]   Name: RYMAN RATHGEBER MRN: [id number] Date of Birth: 08-13-1945

## 2018-02-28 NOTE — Therapy (Signed)
[facility] [address] [place], [place], [address] Phone: [phone]   Fax:  [phone]  Physical Therapy Treatment  Patient Details  Name: Nicholas [name] MRN: [id number] Date of Birth: 01-29-1946 Referring Provider: [name]   Encounter Date: 02/28/2018  PT End of Session - 02/28/18 2004    Visit Number  8    Number of Visits  17    Date for PT Re-Evaluation  04/08/18    Authorization Type  [employer] (Medicare)-will need 10th visit progress note    Authorization Time Period  cert 01/30/61-6/94/85    PT Start Time  1017    PT Stop Time  1057    PT Time Calculation (min)  40 min    Activity Tolerance  Patient tolerated treatment well    Behavior During Therapy  [facility] for tasks assessed/performed       Past Medical History:  Diagnosis Date  . 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 Hyperlipidemia   . Hypertension   . Lymphedema     Past Surgical History:  Procedure Laterality Date  . 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 removal of melanoma of left foot/amputation of toes 1&25 Jul 2006  . WRIST FRACTURE SURGERY     72 years old-set    There were no vitals filed for this visit.  Subjective Assessment - 02/28/18 1019    Subjective  It's morning so I'm better.     Pertinent History  Lymphedema-has compression machine and uses stockings (wife helps); hx of PD/Parkinson's Plus Syndrone; malignant melanoma, hyperlipidemia, HTN, insomnia, obesity, hyperglycemia, dizziness, OSA, urinary urgency    Patient Stated Goals  Pt's goals for therapy are to "be like I used to be."    Currently in Pain?  No/denies         [facility] PT Assessment - 02/28/18 1036      Standardized Balance Assessment   Standardized Balance Assessment  Timed Up and Go Test      Timed Up and Go Test   Normal TUG (seconds)  34.5                   [facility] Adult PT  Treatment/Exercise - 02/28/18 1036      Transfers   Five time sit to stand comments   11.22      Ambulation/Gait   Ambulation/Gait Assistance  5: Supervision    Ambulation/Gait Assistance Details  through simulated restaurant tables/chairs; vc for rocking to restart walking after freeze; vc for longer step lengyth, incr heelstrike    Ambulation Distance (Feet)  100 Feet   360; 75 x 4   Assistive device  Rollator    Gait Pattern  Step-through pattern;Decreased step length - right;Decreased step length - left;Decreased dorsiflexion - right;Decreased dorsiflexion - left;Poor foot clearance - left;Poor foot clearance - right;Festinating    Ambulation Surface  Indoor    Gait velocity  32.8/14.07=2.33 ft/sec    Pre-Gait Activities  at locked rollator, lateral and ant/post wt-shifiing 10 reps x 2 during session        [facility]) - 02/28/18 1048    PWR! exercises  Moves in standing    PWR! Up  20    PWR! Rock  20    PWR! Twist  20    PWR Step  20    Comments  min cues for twist and  step          PT Education - 02/28/18 2003    Education Details  results of STG assessment; tips to reduce freezing    Person(s) Educated  Patient    Methods  Explanation;Demonstration;Verbal cues    Comprehension  Verbalized understanding;Returned demonstration;Verbal cues required       PT Short Term Goals - 02/28/18 2005      PT SHORT TERM GOAL #1   Title  Pt will be independent with HEP for improved balance, transfers and gait.  Updated TARGET 03/01/18    Time  4    Period  Weeks    Status  Achieved      PT SHORT TERM GOAL #2   Title  Pt will improve TUG score to less than or equal to 50 seconds for decreased fall risk.    Baseline  02/28/18 34.5 sec    Time  4    Period  Weeks    Status  Achieved      PT SHORT TERM GOAL #3   Title  Pt wil perform at least 8 of 10 reps of sit<>stand transfers from 18 inch surfaces, with no posterior lean, independently for improved transfer efficiency and  safety.    Baseline  8/8 9 of 10    Time  4    Period  Weeks    Status  Achieved      PT SHORT TERM GOAL #4   Title  Pt will verbalize undersatnding of techniques to reduce freezing episodes with gait and turns    Baseline  02/28/18 pt able to verbalize (continues to need vc at times to implement)    Time  4    Period  Weeks    Status  Achieved        PT Long Term Goals - 02/05/18 2122      PT LONG TERM GOAL #1   Title  Pt will verbalize understanding of fall prevention within the home environment.  TARGET 03/29/18    Time  8    Period  Weeks    Status  On-going    Target Date  03/29/18      PT LONG TERM GOAL #2   Title  Pt will improve TUG score to less than or equal to 35 seconds for decreased fall risk.    Time  8    Period  Weeks    Status  On-going    Target Date  03/29/18      PT LONG TERM GOAL #3   Title  Pt will improve gait velocity with conversation to at least 2.62 ft/sec for improved gait efficiency and safety.    Time  8    Period  Weeks    Status  On-going    Target Date  03/29/18      PT LONG TERM GOAL #4   Title  Pt will verbalize plans for continued community fitness upon d/c from PT.    Time  8    Status  On-going    Target Date  03/29/18            Plan - 02/28/18 2008    Clinical Impression Statement  STGs assessed with pt meeting 4 of 4 goals, demonstrating progress he has made with skilled PT. Remainder of session focused on ambulation through narrow spaces (simulating walking between tables at [employer]) and working through freezing episodes. Patient does well recognizing freezing starting, stopping, relaxing, however then immediatley  begins trying to step again with freezing repeating. vc for wt-shifting and THEN big step with good results. Patient can continue to benefit from skilled PT to improve safety with gait.     Rehab Potential  Good    Clinical Impairments Affecting Rehab Potential  for PT goals/tx plan    PT Frequency  2x / week     PT Duration  8 weeks   plus eval   PT Treatment/Interventions  ADLs/Self Care Home Management;DME Instruction;Gait training;Functional mobility training;Therapeutic activities;Therapeutic exercise;Balance training;Patient/family education;Neuromuscular re-education    PT Next Visit Plan  pre-gait activities at walker, obstacle courses simulating walking in restaurant; turning strategies-wide walking turns and backing up to sit; continue working on reducing freezing; cover fall prevention     Consulted and Agree with Plan of Care  Patient       Patient will benefit from skilled therapeutic intervention in order to improve the following deficits and impairments:  Abnormal gait, Decreased balance, Decreased mobility, Difficulty walking, Decreased safety awareness, Decreased strength, Postural dysfunction  Visit Diagnosis: Other symptoms and signs involving the nervous system  Other abnormalities of gait and mobility     Problem List Patient Active Problem List   Diagnosis Date Noted  . Strain of right biceps 09/12/2017  . Lymphedema 02/10/2017  . BPH associated with nocturia 05/16/2016  . Senile purpura ([place]) 05/03/2016  . OSA (obstructive sleep apnea) 12/28/2015  . Hypersomnia 11/15/2015  . Cough 11/15/2015  . 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 Dizziness 12/30/2014  . Diastolic dysfunction 28/63/8177  . Former smoker 04/29/2014  . Chronic venous insufficiency 02/20/2014  . Hyperglycemia 08/02/2012  . Obesity 07/13/2010  . History of malignant melanoma. left leg 03/09/2009  . Insomnia 10/03/2007  . Hyperlipidemia 12/13/2006  . Essential hypertension 12/13/2006    Rexanne Mano, PT 02/28/2018, 8:15 PM  [facility] [address] [place], [place], [address] Phone: [phone]   Fax:  [phone]  Name: GEOVANIE WINNETT MRN: [id number] Date of Birth: 13-Nov-1945

## 2018-02-28 NOTE — Patient Instructions (Signed)
  Please complete the assigned speech therapy homework prior to your next session and return it to the speech therapist at your next visit.  

## 2018-02-28 NOTE — Therapy (Signed)
[facility] [address] [place], [place], [address] Phone: [phone]   Fax:  [phone]  Occupational Therapy Treatment  Patient Details  Name: Nicholas [name] MRN: [id number] Date of Birth: 02-02-1946 Referring Provider: [name]   Encounter Date: 02/28/2018  OT End of Session - 02/28/18 1112    Visit Number  7    Number of Visits  17    Date for OT Re-Evaluation  03/09/18    Authorization Type  [employer] (Medicare)    Authorization Time Period  cert.  01/08/18-04/08/18 week 1    OT Start Time  1103    OT Stop Time  1145    OT Time Calculation (min)  42 min    Activity Tolerance  Patient tolerated treatment well    Behavior During Therapy  WFL for tasks assessed/performed       Past Medical History:  Diagnosis Date  . 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 Hyperlipidemia   . Hypertension   . Lymphedema     Past Surgical History:  Procedure Laterality Date  . 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 removal of melanoma of left foot/amputation of toes 1&25 Jul 2006  . WRIST FRACTURE SURGERY     72 years old-set    There were no vitals filed for this visit.  Subjective Assessment - 02/28/18 1230    Subjective   pt reports that he thinks he's been doing better since he started therapy    Pertinent History  hx of PD/Parkinson's Plus Syndrone; malignant melanoma, hyperlipidemia, HTN, insomnia, obesity, hyperglycemia, dizziness, OSA, urinary urgency, lymphedema    Patient Stated Goals  improve handwriting    Currently in Pain?  No/denies        Practiced writing:  Used PWR! Hands initially with min v.c.  Then writing name/address with min decr in size, practiced continuous "o" and "8" working on size and letter formation.  Then writing with category generation with min-mod cueing for category generation and only occasional decr in size for writing and good  legibility.  Copying 3 sentences with good legibility and only min decr in size.  Practiced signature.  Pt able to write legibility with effort, but tends to not write all letters.   Checked remaining short-term goals as able and discussed progress.     In standing, functional step and reach in diagonal pattern to each side to grasp/release cylinder object with min-mod cueing for large amplitude movements, PWR! Hands incorporating trunk rotation and wt. Shift.    Practiced fastening/unfastening buttons with min-mod difficulty today, but appears that cut/bandaid on L thumb was interfering.                   OT Short Term Goals - 02/28/18 1115      OT SHORT TERM GOAL #1   Title  Pt will be independent with updated HEP.--check STGs 02/07/18    Status  Achieved      OT SHORT TERM GOAL #2   Title  Pt will be able to write short paragraph and sign name with 100% legibility and only minimal decr in size use AE/strategies.    Status  Achieved      OT SHORT TERM GOAL #3   Title  Pt will improve coordination for ADLs as shown by fastening/unfastening 3 buttons in 43sec or less.    Status  On-going      OT SHORT TERM GOAL #4   Title  Pt will demonstrate improved dynamic standing balance as eveidenced by performing 10 inches or greater on standing functional reach.    Status  Achieved        OT Long Term Goals - 01/09/18 0003      OT LONG TERM GOAL #1   Title  Pt will verbalize understanding of AE/strategies to incr safety, ease, and independence with ADLs/IADLs.--check LTGs 03/10/18    Time  8    Period  Weeks    Status  New      OT LONG TERM GOAL #2   Title  Pt will improve coordination for ADLs as shown by improving time on 9-hole peg test by at least 3sec bilaterally.    Baseline  R-33.69, L-30.69sec    Time  8    Period  Weeks    Status  New      OT LONG TERM GOAL #3   Title  Pt will improve coordination for ADLs as shown by fastening/unfastening 3 buttons in  40sec or less.    Baseline  47.37    Time  8    Period  Weeks    Status  New      OT LONG TERM GOAL #4   Title  Pt will improve ability to dress as shown by improving time on PPT#4 by at least 3 sec and incorporate trunk rotation to decr risk for shoulder injury.    Baseline  19.68sec with no trunk rotation    Time  8    Period  Weeks    Status  New      OT LONG TERM GOAL #5   Title  Pt will improve bradykinesia/hypokinesia for functional reaching/coordination for ADLs as shown by improving score on box and blocks test by at least 3 with LUE.    Baseline  40 blocks    Time  8    Period  Weeks    Status  New       OT Long Term Goals - 01/09/18 0003      OT LONG TERM GOAL #1   Title  Pt will verbalize understanding of AE/strategies to incr safety, ease, and independence with ADLs/IADLs.--check LTGs 03/10/18    Time  8    Period  Weeks    Status  New      OT LONG TERM GOAL #2   Title  Pt will improve coordination for ADLs as shown by improving time on 9-hole peg test by at least 3sec bilaterally.    Baseline  R-33.69, L-30.69sec    Time  8    Period  Weeks    Status  New      OT LONG TERM GOAL #3   Title  Pt will improve coordination for ADLs as shown by fastening/unfastening 3 buttons in 40sec or less.    Baseline  47.37    Time  8    Period  Weeks    Status  New      OT LONG TERM GOAL #4   Title  Pt will improve ability to dress as shown by improving time on PPT#4 by at least 3 sec and incorporate trunk rotation to decr risk for shoulder injury.    Baseline  19.68sec with no trunk rotation    Time  8    Period  Weeks    Status  New      OT  LONG TERM GOAL #5   Title  Pt will improve bradykinesia/hypokinesia for functional reaching/coordination for ADLs as shown by improving score on box and blocks test by at least 3 with LUE.    Baseline  40 blocks    Time  8    Period  Weeks    Status  New             Plan - 02/28/18 1112    Clinical Impression  Statement  STGs #1, 2, 4 met.  STG #3 not assessed due to cut/bandaid on L thumb affecting performance.  Pt progressing towards remaining goals.      Occupational performance deficits (Please refer to evaluation for details):  ADL's;IADL's;Leisure;Work;Rest and Sleep;Social Participation    Rehab Potential  Good    OT Frequency  2x / week    OT Duration  8 weeks    OT Treatment/Interventions  Self-care/ADL training;Therapeutic exercise;Visual/perceptual remediation/compensation;Patient/family education;Neuromuscular education;Moist Heat;Fluidtherapy;Energy conservation;Therapist, nutritional;Therapeutic activities;Balance training;Cognitive remediation/compensation;Passive range of motion;Manual Therapy;DME and/or AE instruction;Ultrasound;Contrast Bath;Cryotherapy    Plan  continue with coordination, functional mobility, functional reaching    Consulted and Agree with Plan of Care  Patient       Patient will benefit from skilled therapeutic intervention in order to improve the following deficits and impairments:  Decreased cognition, Decreased knowledge of use of DME, Impaired vision/preception, Decreased mobility, Decreased coordination, Decreased activity tolerance, Decreased range of motion, Impaired tone, Impaired UE functional use, Impaired perceived functional ability, Decreased safety awareness, Decreased knowledge of precautions, Decreased balance  Visit Diagnosis: Other symptoms and signs involving the nervous system  Other abnormalities of gait and mobility  Other lack of coordination  Other symptoms and signs involving the musculoskeletal system  Unsteadiness on feet    Problem List Patient Active Problem List   Diagnosis Date Noted  . Strain of right biceps 09/12/2017  . Lymphedema 02/10/2017  . BPH associated with nocturia 05/16/2016  . Senile purpura ([place]) 05/03/2016  . OSA (obstructive sleep apnea) 12/28/2015  . Hypersomnia 11/15/2015  . Cough 11/15/2015  .  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 Dizziness 12/30/2014  . Diastolic dysfunction 50/75/7322  . Former smoker 04/29/2014  . Chronic venous insufficiency 02/20/2014  . Hyperglycemia 08/02/2012  . Obesity 07/13/2010  . History of malignant melanoma. left leg 03/09/2009  . Insomnia 10/03/2007  . Hyperlipidemia 12/13/2006  . Essential hypertension 12/13/2006    [facility] 02/28/2018, 12:30 PM  [facility] [address] [place], [place], [address] Phone: [phone]   Fax:  [phone]  Name: Nicholas [name] MRN: [id number] Date of Birth: [birth year]   Vianne Bulls, OTR/L [facility] [address] [place], [place]  [address] [phone] phone [phone] 02/28/18 12:30 PM

## 2018-03-05 ENCOUNTER — Ambulatory Visit: Payer: PPO | Admitting: Occupational Therapy

## 2018-03-05 ENCOUNTER — Encounter: Payer: Self-pay | Admitting: Occupational Therapy

## 2018-03-05 ENCOUNTER — Ambulatory Visit: Payer: PPO | Admitting: Speech Pathology

## 2018-03-05 ENCOUNTER — Encounter: Payer: Self-pay | Admitting: Physical Therapy

## 2018-03-05 ENCOUNTER — Ambulatory Visit: Payer: PPO | Admitting: Physical Therapy

## 2018-03-05 DIAGNOSIS — R29818 Other symptoms and signs involving the nervous system: Secondary | ICD-10-CM

## 2018-03-05 DIAGNOSIS — R2681 Unsteadiness on feet: Secondary | ICD-10-CM

## 2018-03-05 DIAGNOSIS — R2689 Other abnormalities of gait and mobility: Secondary | ICD-10-CM

## 2018-03-05 DIAGNOSIS — R471 Dysarthria and anarthria: Secondary | ICD-10-CM

## 2018-03-05 DIAGNOSIS — R278 Other lack of coordination: Secondary | ICD-10-CM

## 2018-03-05 DIAGNOSIS — R29898 Other symptoms and signs involving the musculoskeletal system: Secondary | ICD-10-CM

## 2018-03-05 NOTE — Therapy (Signed)
[facility] [address] [place], [place], [address] Phone: [phone]   Fax:  [phone]  Speech Language Pathology Treatment  Patient Details  Name: Nicholas [name]: [id number] Date of Birth: [birth year] Referring Provider: [name]    Encounter Date: 03/05/2018  End of Session - 03/05/18 1230    Visit Number  7    Number of Visits  17    Date for SLP Re-Evaluation  04/19/18    SLP Start Time  1016    SLP Stop Time   1100    SLP Time Calculation (min)  44 min    Activity Tolerance  Patient tolerated treatment well       Past Medical History:  Diagnosis Date  . 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 Hyperlipidemia   . Hypertension   . Lymphedema     Past Surgical History:  Procedure Laterality Date  . 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 removal of melanoma of left foot/amputation of toes 1&25 Jul 2006  . WRIST FRACTURE SURGERY     72 years old-set    There were no vitals filed for this visit.  Subjective Assessment - 03/05/18 1022    Subjective  "My medicine gives me dry mouth."    Currently in Pain?  No/denies            ADULT SLP TREATMENT - 03/05/18 1018      General Information   Behavior/Cognition  Alert;Cooperative;Pleasant mood      Treatment Provided   Treatment provided  Cognitive-Linquistic      Pain Assessment   Pain Assessment  No/denies pain      Cognitive-Linquistic Treatment   Treatment focused on  Dysarthria    Skilled Treatment  Pt entered treatment room with WNL vocal intensity. As SLP progressed to mod complex conversation, pt with decrease in volume, averaging mid 60s dB. SLP recalibrated loudness in conversation using loud /a/, average low-mid 90s dB mod I. Progressed to sentence level responses using effort level of 8, average low 70s dB (usual min A fading to rare min A). In conversation, cues for effort  level were most effective for increase in vocal intensity, average in conversation upper 60s dB with occasional mod A.      Assessment / Recommendations / Plan   Plan  Continue with current plan of care      Progression Toward Goals   Progression toward goals  Progressing toward goals         SLP Short Term Goals - 03/05/18 1023      SLP SHORT TERM GOAL #1   Title  pt will sustain loud /a/ at >90 dB at 30 cm over 3 consecutive sessions    Status  Achieved      SLP SHORT TERM GOAL #2   Title  pt will exhibit volume of 70dB in sentence responses 95% over 3 sessions    Baseline  02/26/18, 03/05/18    Time  1    Period  Weeks    Status  On-going      SLP SHORT TERM GOAL #3   Title  In 7 minutes simple conversation pt will achieve average speech volume of low 70s dB over two sessions    Time  1    Period  Weeks    Status  On-going      SLP  SHORT TERM GOAL #4   Title  Pt will participate in objective assessment of swallow function (MBS or FEES) if warranted.    Status  Achieved      SLP SHORT TERM GOAL #5   Title  Pt will complete simple-mod complex naming tasks with 90% accuracy and rare min A     Time  2    Period  Weeks    Status  On-going       SLP Long Term Goals - 03/05/18 1024      SLP LONG TERM GOAL #1   Title  pt will sustain loud /a/ at average >90dB at 30 cm over 5 consecutive sessions    Baseline  02-12-18, 02-14-18, 02-22-18; 02/26/18; 02-28-18 03/05/18    Time  5    Period  Weeks    Status  Achieved      SLP LONG TERM GOAL #2   Title  pt will generate speech in 10 minutes mod complex conversation with average >70dB over three sessions    Time  5    Period  Weeks    Status  On-going      SLP LONG TERM GOAL #3   Title  Pt will tell SLP 3 s/sx of aspiration PNA.    Time  5    Period  Weeks    Status  On-going      SLP LONG TERM GOAL #4   Title  Pt will utilize compensations for anomia in structured tasks with rare min A over 4 sessions.     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 In structured tasks, he requires SLP A to average 70dB with sentence responses, however conversation continues with low volume and reduced intelligilbility with spouse, family and friends. Continue skilled ST to maximize intelligibility for improved QOL and to reduce pt and spouse frustration.     Speech Therapy Frequency  2x / week    Treatment/Interventions  Aspiration precaution training;Diet toleration management by SLP;Trials of upgraded texture/liquids;Cognitive reorganization;Multimodal communcation approach;Compensatory strategies;Pharyngeal strengthening exercises;Language facilitation;Compensatory techniques;Cueing hierarchy;Internal/external aids;Functional tasks;SLP instruction and feedback;Patient/family education;Other (comment)    Potential to Achieve Goals  Good    SLP Home Exercise Plan  loud /a/    Consulted and Agree with Plan of Care  Patient       Patient will benefit from skilled therapeutic intervention in order to improve the following deficits and impairments:   Dysarthria and anarthria    Problem List Patient Active Problem List   Diagnosis Date Noted  . Strain of right biceps 09/12/2017  . Lymphedema 02/10/2017  . BPH associated with nocturia 05/16/2016  . Senile purpura ([place]) 05/03/2016  . OSA (obstructive sleep apnea) 12/28/2015  . Hypersomnia 11/15/2015  . Cough 11/15/2015  . 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 Dizziness 12/30/2014  . Diastolic dysfunction 84/13/2440  . Former smoker 04/29/2014  . Chronic venous insufficiency 02/20/2014  . Hyperglycemia 08/02/2012  . Obesity 07/13/2010  . History of malignant melanoma. left leg 03/09/2009  . Insomnia 10/03/2007  . Hyperlipidemia 12/13/2006  . Essential hypertension 12/13/2006   Deneise Lever, [place],  [place] 03/05/2018, 12:31 PM  [facility] [address] [place], [place], [address] Phone: [phone]  Fax:  [phone]   Name: Nicholas [name]: [id number] Date of Birth: 1945-10-07

## 2018-03-05 NOTE — Therapy (Signed)
[facility] [address] [place], [place], [address] Phone: [phone]   Fax:  [phone]  Physical Therapy Treatment  Patient Details  Name: Nicholas [name] MRN: [id number] Date of Birth: 02/11/1946 Referring Provider: [name]   Encounter Date: 03/05/2018  PT End of Session - 03/05/18 1234    Visit Number  9    Number of Visits  17    Date for PT Re-Evaluation  04/08/18    Authorization Type  [employer] (Medicare)-will need 10th visit progress note    Authorization Time Period  cert 11/09/60-2/29/79    PT Start Time  1148    PT Stop Time  1230    PT Time Calculation (min)  42 min    Activity Tolerance  Patient tolerated treatment well    Behavior During Therapy  [facility] for tasks assessed/performed       Past Medical History:  Diagnosis Date  . Cancer [facility]) Jan 2008   skin; hx of melanoma left foot/ amputation of toes 1&2   . Hyperlipidemia   . Hypertension   . Lymphedema     Past Surgical History:  Procedure Laterality Date  . 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 removal of melanoma of left foot/amputation of toes 1&25 Jul 2006  . WRIST FRACTURE SURGERY     72 years old-set    There were no vitals filed for this visit.  Subjective Assessment - 03/05/18 1145    Subjective  No falls.  Having "the same old stuff" as my problems-still freeze at doorways and in restaurants. What we are practicing makes it easier to get started.    Pertinent History  Lymphedema-has compression machine and uses stockings (wife helps); hx of PD/Parkinson's Plus Syndrone; malignant melanoma, hyperlipidemia, HTN, insomnia, obesity, hyperglycemia, dizziness, OSA, urinary urgency    Patient Stated Goals  Pt's goals for therapy are to "be like I used to be."    Currently in Pain?  No/denies                       [facility] Adult PT Treatment/Exercise - 03/05/18 0001      Transfers   Transfers  Sit to  Stand;Stand to Sit    Sit to Stand  5: Supervision;With upper extremity assist;From chair/3-in-1    Sit to Stand Details (indicate cue type and reason)  Cues to more easily push/pull chair up to/away from table    Stand to Sit  5: Supervision;4: Min guard;With upper extremity assist;To chair/3-in-1      Ambulation/Gait   Ambulation/Gait  Yes    Ambulation/Gait Assistance  5: Supervision    Ambulation/Gait Assistance Details  Simulated restaurant areas between stools and mat tables, with tight 90 degree turns    Ambulation Distance (Feet)  100 Feet   x 3, 230 ft   Assistive device  Rollator    Gait Pattern  Step-through pattern;Decreased step length - right;Decreased step length - left;Decreased dorsiflexion - right;Decreased dorsiflexion - left;Poor foot clearance - left;Poor foot clearance - right;Festinating    Pre-Gait Activities  At locked rollator:  marching in place:  stagger stance forward/back weightshifting, lateral weightshift and lift, stagger stance weightshift and lift, side step and weightshift, forward step and weightshift, then side step-together>side step-together, x 10 reps each     Gait Comments  With walking in narrow spaces simulating restuarant, PT provides cues for slowed pace, heelstrike/foot clearance, and with episodes of  festination, pt is able to demonstrate stagger stance rocking, then start with BIG step again to start.  With gait through lobby at end of session, pt pushes automatic door button x 2, then (with slight hesitation), begins with BIG step to start and gets through doorway without freezing.               PT Short Term Goals - 02/28/18 2005      PT SHORT TERM GOAL #1   Title  Pt will be independent with HEP for improved balance, transfers and gait.  Updated TARGET 03/01/18    Time  4    Period  Weeks    Status  Achieved      PT SHORT TERM GOAL #2   Title  Pt will improve TUG score to less than or equal to 50 seconds for decreased fall risk.     Baseline  02/28/18 34.5 sec    Time  4    Period  Weeks    Status  Achieved      PT SHORT TERM GOAL #3   Title  Pt wil perform at least 8 of 10 reps of sit<>stand transfers from 18 inch surfaces, with no posterior lean, independently for improved transfer efficiency and safety.    Baseline  8/8 9 of 10    Time  4    Period  Weeks    Status  Achieved      PT SHORT TERM GOAL #4   Title  Pt will verbalize undersatnding of techniques to reduce freezing episodes with gait and turns    Baseline  02/28/18 pt able to verbalize (continues to need vc at times to implement)    Time  4    Period  Weeks    Status  Achieved        PT Long Term Goals - 02/05/18 2122      PT LONG TERM GOAL #1   Title  Pt will verbalize understanding of fall prevention within the home environment.  TARGET 03/29/18    Time  8    Period  Weeks    Status  On-going    Target Date  03/29/18      PT LONG TERM GOAL #2   Title  Pt will improve TUG score to less than or equal to 35 seconds for decreased fall risk.    Time  8    Period  Weeks    Status  On-going    Target Date  03/29/18      PT LONG TERM GOAL #3   Title  Pt will improve gait velocity with conversation to at least 2.62 ft/sec for improved gait efficiency and safety.    Time  8    Period  Weeks    Status  On-going    Target Date  03/29/18      PT LONG TERM GOAL #4   Title  Pt will verbalize plans for continued community fitness upon d/c from PT.    Time  8    Status  On-going    Target Date  03/29/18            Plan - 03/05/18 1234    Clinical Impression Statement  Skilled PT session focused on pre-gait activities at locked walker for varied stepping and weightshifting activities, then practices ambulation through narrow spaces simulating restaurant areas (as pt reports he eats out nearly every night).  Pt does a good job recognizing freezing episode starting, and  uses stagger stance rocking, then BIG step to start.  He does need repeated  cues today for improved foot clearance and heelstrike, to avoid walking too fast with decreased foot clearance.    Rehab Potential  Good    Clinical Impairments Affecting Rehab Potential  for PT goals/tx plan    PT Frequency  2x / week    PT Duration  8 weeks   plus eval   PT Treatment/Interventions  ADLs/Self Care Home Management;DME Instruction;Gait training;Functional mobility training;Therapeutic activities;Therapeutic exercise;Balance training;Patient/family education;Neuromuscular re-education    PT Next Visit Plan  Continue pre-gait activities at walker, obstacle courses simulating walking in restaurant; turning strategies-wide walking turns and backing up to sit; continue working on reducing freezing; cover fall prevention     Consulted and Agree with Plan of Care  Patient      PLAN:  10th visit progress note due next visit    Patient will benefit from skilled therapeutic intervention in order to improve the following deficits and impairments:  Abnormal gait, Decreased balance, Decreased mobility, Difficulty walking, Decreased safety awareness, Decreased strength, Postural dysfunction  Visit Diagnosis: Other symptoms and signs involving the nervous system  Other abnormalities of gait and mobility     Problem List Patient Active Problem List   Diagnosis Date Noted  . Strain of right biceps 09/12/2017  . Lymphedema 02/10/2017  . BPH associated with nocturia 05/16/2016  . Senile purpura ([place]) 05/03/2016  . OSA (obstructive sleep apnea) 12/28/2015  . Hypersomnia 11/15/2015  . Cough 11/15/2015  . 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 Dizziness 12/30/2014  . Diastolic dysfunction 85/63/1497  . Former smoker 04/29/2014  . Chronic venous insufficiency 02/20/2014  . Hyperglycemia 08/02/2012  . Obesity 07/13/2010  . History of malignant melanoma. left leg 03/09/2009  . Insomnia 10/03/2007  . Hyperlipidemia 12/13/2006  . Essential hypertension 12/13/2006     Nicholas Casher W. 03/05/2018, 12:37 PM [name]., PT  [facility] [address] [place], [place], [address] Phone: [phone]   Fax:  [phone]  Name: Nicholas [name] MRN: [id number] Date of Birth: 1946-06-21

## 2018-03-05 NOTE — Therapy (Signed)
[facility] [address] [place], [place], [address] Phone: [phone]   Fax:  [phone]  Occupational Therapy Treatment  Patient Details  Name: Nicholas [name] MRN: [id number] Date of Birth: 09-28-1945 Referring Provider: [name]   Encounter Date: 03/05/2018  OT End of Session - 03/05/18 1106    Visit Number  8    Number of Visits  17    Date for OT Re-Evaluation  03/09/18    Authorization Type  [employer] (Medicare)    Authorization Time Period  cert.  01/08/18-04/08/18 week 1    Authorization - Visit Number  1    Authorization - Number of Visits  8    OT Start Time  3664    OT Stop Time  1145    OT Time Calculation (min)  40 min    Activity Tolerance  Patient tolerated treatment well    Behavior During Therapy  [facility] for tasks assessed/performed       Past Medical History:  Diagnosis Date  . 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 Hyperlipidemia   . Hypertension   . Lymphedema     Past Surgical History:  Procedure Laterality Date  . 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 removal of melanoma of left foot/amputation of toes 1&25 Jul 2006  . WRIST FRACTURE SURGERY     72 years old-set    There were no vitals filed for this visit.  Subjective Assessment - 03/05/18 1105    Subjective   no falls, bruise on RUE unknown     Pertinent History  hx of PD/Parkinson's Plus Syndrone; malignant melanoma, hyperlipidemia, HTN, insomnia, obesity, hyperglycemia, dizziness, OSA, urinary urgency, lymphedema    Patient Stated Goals  improve handwriting    Currently in Pain?  No/denies        Practiced Fastening/unfasten buttons  With min-mod cueing/difficulty  For use of strategies.  Checked goal (met--see below)  Functional step and reach side to side incorporating trunk rotation, wt. Sift in prep for turns with lateral and overhead reaching with min-mod cueing for  large amplitude and deliberate movements.  Sliding cards off table by using PWR! Hands with focus on finger ext and min cues for incr   In standing, functional step and reach diagonally with each UE/LE to flip large cards with focus on coordination of UE/LE,  And large amplitude movements, trunk rotation/wt. Shift and UE supination elbow/finger extension with min-mod cueing.  Min-mod cueing for large amplitude movement strategies and strategies for freezing when negotiating small spaces during session:   Turning, stepping in front of and away from table and scooting up/back from table.                               OT Short Term Goals - 02/28/18 1115      OT SHORT TERM GOAL #1   Title  Pt will be independent with updated HEP.--check STGs 02/07/18    Status  Achieved      OT SHORT TERM GOAL #2   Title  Pt will be able to write short paragraph and sign name with 100% legibility and only minimal decr in size use AE/strategies.    Status  Achieved   02/28/18     OT SHORT TERM GOAL #3   Title  Pt will improve  coordination for ADLs as shown by fastening/unfastening 3 buttons in 43sec or less.    Status  On-going   02/28/18:  unable to test due to bandaid/cut on L thumb     OT SHORT TERM GOAL #4   Title  Pt will demonstrate improved dynamic standing balance as eveidenced by performing 10 inches or greater on standing functional reach.    Status  Achieved   02/28/18:  10" bilaterally       OT Long Term Goals - 01/09/18 0003      OT LONG TERM GOAL #1   Title  Pt will verbalize understanding of AE/strategies to incr safety, ease, and independence with ADLs/IADLs.--check LTGs 03/10/18    Time  8    Period  Weeks    Status  New      OT LONG TERM GOAL #2   Title  Pt will improve coordination for ADLs as shown by improving time on 9-hole peg test by at least 3sec bilaterally.    Baseline  R-33.69, L-30.69sec    Time  8    Period  Weeks    Status  New      OT LONG  TERM GOAL #3   Title  Pt will improve coordination for ADLs as shown by fastening/unfastening 3 buttons in 40sec or less.    Baseline  47.37    Time  8    Period  Weeks    Status  New      OT LONG TERM GOAL #4   Title  Pt will improve ability to dress as shown by improving time on PPT#4 by at least 3 sec and incorporate trunk rotation to decr risk for shoulder injury.    Baseline  19.68sec with no trunk rotation    Time  8    Period  Weeks    Status  New      OT LONG TERM GOAL #5   Title  Pt will improve bradykinesia/hypokinesia for functional reaching/coordination for ADLs as shown by improving score on box and blocks test by at least 3 with LUE.    Baseline  40 blocks    Time  8    Period  Weeks    Status  New            Plan - 03/05/18 1107    Clinical Impression Statement  Pt is progressing towards goals with improving coordination and functional mobility.      Occupational performance deficits (Please refer to evaluation for details):  ADL's;IADL's;Leisure;Work;Rest and Sleep;Social Participation    Rehab Potential  Good    OT Frequency  2x / week    OT Duration  8 weeks    OT Treatment/Interventions  Self-care/ADL training;Therapeutic exercise;Visual/perceptual remediation/compensation;Patient/family education;Neuromuscular education;Moist Heat;Fluidtherapy;Energy conservation;Therapist, nutritional;Therapeutic activities;Balance training;Cognitive remediation/compensation;Passive range of motion;Manual Therapy;DME and/or AE instruction;Ultrasound;Contrast Bath;Cryotherapy    Plan  continue with coordination, functional mobility, functional reaching    Consulted and Agree with Plan of Care  Patient       Patient will benefit from skilled therapeutic intervention in order to improve the following deficits and impairments:  Decreased cognition, Decreased knowledge of use of DME, Impaired vision/preception, Decreased mobility, Decreased coordination, Decreased  activity tolerance, Decreased range of motion, Impaired tone, Impaired UE functional use, Impaired perceived functional ability, Decreased safety awareness, Decreased knowledge of precautions, Decreased balance  Visit Diagnosis: Other symptoms and signs involving the nervous system  Other abnormalities of gait and mobility  Other lack of coordination  Other symptoms and signs involving the musculoskeletal system  Unsteadiness on feet    Problem List Patient Active Problem List   Diagnosis Date Noted  . Strain of right biceps 09/12/2017  . Lymphedema 02/10/2017  . BPH associated with nocturia 05/16/2016  . Senile purpura ([place]) 05/03/2016  . OSA (obstructive sleep apnea) 12/28/2015  . Hypersomnia 11/15/2015  . Cough 11/15/2015  . 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 Dizziness 12/30/2014  . Diastolic dysfunction 58/85/0277  . Former smoker 04/29/2014  . Chronic venous insufficiency 02/20/2014  . Hyperglycemia 08/02/2012  . Obesity 07/13/2010  . History of malignant melanoma. left leg 03/09/2009  . Insomnia 10/03/2007  . Hyperlipidemia 12/13/2006  . Essential hypertension 12/13/2006    [facility] 03/05/2018, 11:10 AM  [facility] [address] [place], [place], [address] Phone: [phone]   Fax:  [phone]  Name: RIHAN SCHUELER MRN: [id number] Date of Birth: 07-Jul-1946   Vianne Bulls, OTR/L [facility] [address] [place], [place]  [address] [phone] phone [phone] 03/05/18 11:12 AM

## 2018-03-06 ENCOUNTER — Ambulatory Visit: Payer: PPO | Admitting: Physical Therapy

## 2018-03-06 ENCOUNTER — Ambulatory Visit: Payer: PPO | Admitting: Occupational Therapy

## 2018-03-06 ENCOUNTER — Encounter: Payer: Self-pay | Admitting: Physical Therapy

## 2018-03-06 ENCOUNTER — Ambulatory Visit: Payer: PPO

## 2018-03-06 DIAGNOSIS — R29818 Other symptoms and signs involving the nervous system: Secondary | ICD-10-CM

## 2018-03-06 DIAGNOSIS — R1312 Dysphagia, oropharyngeal phase: Secondary | ICD-10-CM

## 2018-03-06 DIAGNOSIS — R471 Dysarthria and anarthria: Secondary | ICD-10-CM

## 2018-03-06 DIAGNOSIS — R29898 Other symptoms and signs involving the musculoskeletal system: Secondary | ICD-10-CM

## 2018-03-06 DIAGNOSIS — R278 Other lack of coordination: Secondary | ICD-10-CM

## 2018-03-06 DIAGNOSIS — R2689 Other abnormalities of gait and mobility: Secondary | ICD-10-CM

## 2018-03-06 DIAGNOSIS — R2681 Unsteadiness on feet: Secondary | ICD-10-CM

## 2018-03-06 DIAGNOSIS — R41841 Cognitive communication deficit: Secondary | ICD-10-CM

## 2018-03-06 NOTE — Therapy (Signed)
[facility] [address] [place], [place], [address] Phone: [phone]   Fax:  [phone]  Occupational Therapy Treatment  Patient Details  Name: Nicholas [name] MRN: [id number] Date of Birth: Nov 15, 1945 Referring Provider: [name]   Encounter Date: 03/06/2018  OT End of Session - 03/06/18 1303    Visit Number  9    Number of Visits  17    Date for OT Re-Evaluation  03/09/18    Authorization Type  [employer] (Medicare)    Authorization Time Period  cert.  01/08/18-04/08/18 week 1    Authorization - Visit Number  9    Authorization - Number of Visits  10    OT Start Time  1020    OT Stop Time  1100    OT Time Calculation (min)  40 min       Past Medical History:  Diagnosis Date  . 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 Hyperlipidemia   . Hypertension   . Lymphedema     Past Surgical History:  Procedure Laterality Date  . 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 removal of melanoma of left foot/amputation of toes 1&25 Jul 2006  . WRIST FRACTURE SURGERY     72 years old-set    There were no vitals filed for this visit.  Subjective Assessment - 03/06/18 1309    Pertinent History  hx of PD/Parkinson's Plus Syndrone; malignant melanoma, hyperlipidemia, HTN, insomnia, obesity, hyperglycemia, dizziness, OSA, urinary urgency, lymphedema    Patient Stated Goals  improve handwriting    Currently in Pain?  No/denies           Treatment: PWR! Basic 4 in modified quadraped, min v.c/ facilitation Donning doffing jacket with adapted techniques with good performance, no LOB. Pt practiced writing, min v.c initally Pt demonstrates 100% legibility and min decrease in letter size. Grooved pegboard with LUE for improved fine motor coordination, placing then removing with in hand manipulation, min difficulty. Pt was provided with information to pursue a large TED hose  donner online, pt plans to pursue.                  OT Short Term Goals - 03/05/18 1116      OT SHORT TERM GOAL #1   Title  Pt will be independent with updated HEP.--check STGs 02/07/18    Status  Achieved      OT SHORT TERM GOAL #2   Title  Pt will be able to write short paragraph and sign name with 100% legibility and only minimal decr in size use AE/strategies.    Status  Achieved   02/28/18     OT SHORT TERM GOAL #3   Title  Pt will improve coordination for ADLs as shown by fastening/unfastening 3 buttons in 43sec or less.    Status  Achieved   02/28/18:  unable to test due to bandaid/cut on L thumb.  03/05/18:  34.12     OT SHORT TERM GOAL #4   Title  Pt will demonstrate improved dynamic standing balance as eveidenced by performing 10 inches or greater on standing functional reach.    Status  Achieved   02/28/18:  10" bilaterally       OT Long Term Goals - 03/05/18 1134      OT LONG TERM GOAL #1   Title  Pt will verbalize understanding  of AE/strategies to incr safety, ease, and independence with ADLs/IADLs.--check LTGs 03/10/18    Time  8    Period  Weeks    Status  New      OT LONG TERM GOAL #2   Title  Pt will improve coordination for ADLs as shown by improving time on 9-hole peg test by at least 3sec bilaterally.    Baseline  R-33.69, L-30.69sec    Time  8    Period  Weeks    Status  New      OT LONG TERM GOAL #3   Title  Pt will improve coordination for ADLs as shown by fastening/unfastening 3 buttons in 40sec or less.    Baseline  47.37    Time  8    Period  Weeks    Status  Achieved   03/05/18     OT LONG TERM GOAL #4   Title  Pt will improve ability to dress as shown by improving time on PPT#4 by at least 3 sec and incorporate trunk rotation to decr risk for shoulder injury.    Baseline  19.68sec with no trunk rotation    Time  8    Period  Weeks    Status  New      OT LONG TERM GOAL #5   Title  Pt will improve bradykinesia/hypokinesia for  functional reaching/coordination for ADLs as shown by improving score on box and blocks test by at least 3 with LUE.    Baseline  40 blocks    Time  8    Period  Weeks    Status  New            Plan - 03/06/18 1305    Clinical Impression Statement  Pt is progressing towards goals with improving handwriting and ADL performance.    Occupational performance deficits (Please refer to evaluation for details):  ADL's;IADL's;Leisure;Work;Rest and Sleep;Social Participation    Rehab Potential  Good    OT Frequency  2x / week    OT Duration  8 weeks    OT Treatment/Interventions  Self-care/ADL training;Therapeutic exercise;Visual/perceptual remediation/compensation;Patient/family education;Neuromuscular education;Moist Heat;Fluidtherapy;Energy conservation;Therapist, nutritional;Therapeutic activities;Balance training;Cognitive remediation/compensation;Passive range of motion;Manual Therapy;DME and/or AE instruction;Ultrasound;Contrast Bath;Cryotherapy    Plan  work towards long term goals, coordination, functional mobility, functional reaching    Consulted and Agree with Plan of Care  Patient       Patient will benefit from skilled therapeutic intervention in order to improve the following deficits and impairments:  Decreased cognition, Decreased knowledge of use of DME, Impaired vision/preception, Decreased mobility, Decreased coordination, Decreased activity tolerance, Decreased range of motion, Impaired tone, Impaired UE functional use, Impaired perceived functional ability, Decreased safety awareness, Decreased knowledge of precautions, Decreased balance  Visit Diagnosis: Other symptoms and signs involving the nervous system  Other abnormalities of gait and mobility  Other lack of coordination  Other symptoms and signs involving the musculoskeletal system  Unsteadiness on feet    Problem List Patient Active Problem List   Diagnosis Date Noted  . Strain of right biceps  09/12/2017  . Lymphedema 02/10/2017  . BPH associated with nocturia 05/16/2016  . Senile purpura ([place]) 05/03/2016  . OSA (obstructive sleep apnea) 12/28/2015  . Hypersomnia 11/15/2015  . Cough 11/15/2015  . 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 Dizziness 12/30/2014  . Diastolic dysfunction 39/76/7341  . Former smoker 04/29/2014  . Chronic venous insufficiency 02/20/2014  . Hyperglycemia 08/02/2012  . Obesity 07/13/2010  . History of  malignant melanoma. left leg 03/09/2009  . Insomnia 10/03/2007  . Hyperlipidemia 12/13/2006  . Essential hypertension 12/13/2006    RINE,KATHRYN 03/06/2018, 1:09 PM  [facility] [address] [place], [place], [address] Phone: [phone]   Fax:  [phone]  Name: JUSITN SALSGIVER MRN: [id number] Date of Birth: 1945-10-26

## 2018-03-07 NOTE — Therapy (Signed)
[facility] [address] [place], [place], [address] Phone: [phone]   Fax:  [phone]  Physical Therapy Treatment/10th visit progress note  Patient Details  Name: Nicholas [name] MRN: [id number] Date of Birth: 03/05/1946 Referring Provider: [name]   Encounter Date: 03/06/2018  PT End of Session - 03/07/18 0832    Visit Number  10    Number of Visits  17    Date for PT Re-Evaluation  04/08/18    Authorization Type  [employer] (Medicare)-will need 10th visit progress note    Authorization Time Period  cert 6/96/29-12/19/39    PT Start Time  1103    PT Stop Time  1143    PT Time Calculation (min)  40 min    Activity Tolerance  Patient tolerated treatment well    Behavior During Therapy  [facility] for tasks assessed/performed       Past Medical History:  Diagnosis Date  . 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 Hyperlipidemia   . Hypertension   . Lymphedema     Past Surgical History:  Procedure Laterality Date  . 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 removal of melanoma of left foot/amputation of toes 1&25 Jul 2006  . WRIST FRACTURE SURGERY     72 years old-set    There were no vitals filed for this visit.  Subjective Assessment - 03/06/18 1104    Subjective  No new changes since yesterday.  Have a wedding to go to this weekend, and I'm a little afraid of all the people.    Pertinent History  Lymphedema-has compression machine and uses stockings (wife helps); hx of PD/Parkinson's Plus Syndrone; malignant melanoma, hyperlipidemia, HTN, insomnia, obesity, hyperglycemia, dizziness, OSA, urinary urgency    Patient Stated Goals  Pt's goals for therapy are to "be like I used to be."    Currently in Pain?  No/denies                       [facility] Adult PT Treatment/Exercise - 03/07/18 0001      Ambulation/Gait   Ambulation/Gait  Yes     Ambulation/Gait Assistance  5: Supervision    Ambulation/Gait Assistance Details  Simulated church pews, with sidestepping in narrow spaces (to simulate wedding he is going to this weekend).  Cues for slowed pace and increased step height/step length    Ambulation Distance (Feet)  200 Feet   3 reps   Assistive device  Rollator    Gait Pattern  Step-through pattern;Decreased step length - right;Decreased step length - left;Decreased dorsiflexion - right;Decreased dorsiflexion - left;Poor foot clearance - left;Poor foot clearance - right;Festinating    Ambulation Surface  Indoor    Door Management  5: Supervision    Door Managment Details (indicate cue type and reason)  Worked on automatic doorway negotiation, with pt pushing button and with cues to rock and BIG step to start, he is able to negotiate doorways with minimal festinating.      Pre-Gait Activities  At locked rollator:  marching in place, forward step and weightshift, side step and weightshift, stagger stance forward/back weightshiftng with cues for rocking to lift toes on front foot when rocking back; lateral weightshifting with widened BOS, at least 10 reps with intermittent UE support.  Practiced side step-together to R and L at least 5 reps    Gait  Comments  Pt has more difficulty today with foot clearance (he reports its the bigger shoes he has ontoday).  Needs extra time and cues for turning to sit today.      Worked on problem-solving different scenarios for negotiating church pews for upcoming wedding this weekend.         PT Short Term Goals - 02/28/18 2005      PT SHORT TERM GOAL #1   Title  Pt will be independent with HEP for improved balance, transfers and gait.  Updated TARGET 03/01/18    Time  4    Period  Weeks    Status  Achieved      PT SHORT TERM GOAL #2   Title  Pt will improve TUG score to less than or equal to 50 seconds for decreased fall risk.    Baseline  02/28/18 34.5 sec    Time  4    Period  Weeks     Status  Achieved      PT SHORT TERM GOAL #3   Title  Pt wil perform at least 8 of 10 reps of sit<>stand transfers from 18 inch surfaces, with no posterior lean, independently for improved transfer efficiency and safety.    Baseline  8/8 9 of 10    Time  4    Period  Weeks    Status  Achieved      PT SHORT TERM GOAL #4   Title  Pt will verbalize undersatnding of techniques to reduce freezing episodes with gait and turns    Baseline  02/28/18 pt able to verbalize (continues to need vc at times to implement)    Time  4    Period  Weeks    Status  Achieved        PT Long Term Goals - 02/05/18 2122      PT LONG TERM GOAL #1   Title  Pt will verbalize understanding of fall prevention within the home environment.  TARGET 03/29/18    Time  8    Period  Weeks    Status  On-going    Target Date  03/29/18      PT LONG TERM GOAL #2   Title  Pt will improve TUG score to less than or equal to 35 seconds for decreased fall risk.    Time  8    Period  Weeks    Status  On-going    Target Date  03/29/18      PT LONG TERM GOAL #3   Title  Pt will improve gait velocity with conversation to at least 2.62 ft/sec for improved gait efficiency and safety.    Time  8    Period  Weeks    Status  On-going    Target Date  03/29/18      PT LONG TERM GOAL #4   Title  Pt will verbalize plans for continued community fitness upon d/c from PT.    Time  8    Status  On-going    Target Date  03/29/18            Plan - 03/07/18 5053    Clinical Impression Statement  10th visit progress note, with PT initiated 01/08/18, returned to PT for 2nd visit 02/05/18, with therapy focusing on pre-gait and gait activities to assist with decreasing frequency and duration of festinating episodes with gait and turns.  STGS checked last week, with pt meeting all STGs.  He benefits best  from cues, especially with slowed pace for gait and conitnues to be distracted during gait, which contributes to freezing episodes.  Pt  will continue to benefit from skilled PT to address gait, balance, reduction in freezing episodes to reduce falls.  Plan to continue towards LTGs.    Rehab Potential  Good    Clinical Impairments Affecting Rehab Potential  for PT goals/tx plan    PT Frequency  2x / week    PT Duration  8 weeks   plus eval   PT Treatment/Interventions  ADLs/Self Care Home Management;DME Instruction;Gait training;Functional mobility training;Therapeutic activities;Therapeutic exercise;Balance training;Patient/family education;Neuromuscular re-education    PT Next Visit Plan  Continue pre-gait activities at walker, obstacle courses simulating walking in restaurant; turning strategies-wide walking turns and backing up to sit; continue working on reducing freezing; cover fall prevention     Consulted and Agree with Plan of Care  Patient       Patient will benefit from skilled therapeutic intervention in order to improve the following deficits and impairments:  Abnormal gait, Decreased balance, Decreased mobility, Difficulty walking, Decreased safety awareness, Decreased strength, Postural dysfunction  Visit Diagnosis: Other abnormalities of gait and mobility  Other symptoms and signs involving the nervous system  Unsteadiness on feet     Problem List Patient Active Problem List   Diagnosis Date Noted  . Strain of right biceps 09/12/2017  . Lymphedema 02/10/2017  . BPH associated with nocturia 05/16/2016  . Senile purpura ([place]) 05/03/2016  . OSA (obstructive sleep apnea) 12/28/2015  . Hypersomnia 11/15/2015  . Cough 11/15/2015  . 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 Dizziness 12/30/2014  . Diastolic dysfunction 32/35/5732  . Former smoker 04/29/2014  . Chronic venous insufficiency 02/20/2014  . Hyperglycemia 08/02/2012  . Obesity 07/13/2010  . History of malignant melanoma. left leg 03/09/2009  . Insomnia 10/03/2007  . Hyperlipidemia 12/13/2006  . Essential hypertension 12/13/2006     Sya Nestler W. 03/07/2018, 8:36 AM  [name]., PT   [facility] [address] [place], [place], [address] Phone: [phone]   Fax:  [phone]  Name: Nicholas [name] MRN: [id number] Date of Birth: Jan 01, 1946

## 2018-03-07 NOTE — Therapy (Signed)
[facility] [address] [place], [place], [address] Phone: [phone]   Fax:  [phone]  Speech Language Pathology Treatment  Patient Details  Name: Nicholas [name] MRN: [id number] Date of Birth: 03/29/1946 Referring Provider: [name]    Encounter Date: 03/06/2018  End of Session - 03/07/18 0828    Visit Number  8    Number of Visits  17    Date for SLP Re-Evaluation  04/19/18    SLP Start Time  3474    SLP Stop Time   1225    SLP Time Calculation (min)  40 min       Past Medical History:  Diagnosis Date  . 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 Hyperlipidemia   . Hypertension   . Lymphedema     Past Surgical History:  Procedure Laterality Date  . 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 removal of melanoma of left foot/amputation of toes 1&25 Jul 2006  . WRIST FRACTURE SURGERY     72 years old-set    There were no vitals filed for this visit.  Subjective Assessment - 03/06/18 1151    Subjective  "They wore me out." Pt took 40 seconds to get into ST room due to a freezing episode at the threshold.    Currently in Pain?  No/denies            ADULT SLP TREATMENT - 03/07/18 0001      General Information   Behavior/Cognition  Alert;Cooperative;Pleasant mood      Treatment Provided   Treatment provided  Cognitive-Linquistic      Cognitive-Linquistic Treatment   Treatment focused on  Dysarthria    Skilled Treatment  Pt entered treatment room with sub-WNL vocal intensity (mid 60s dB) compared to previous session when pt maintained WNL volume. Loud /a/ was used to recalibrate pt's conversational loudness, average low 90s dB. After loud /a/ in 3-4 minutes of mod complex conversation pt produced decreased volume- average mid 60s dB. SLP recalibrated loudness in conversation using loud /a/, average low-mid 90s dB mod I. Loudness remained in WNL range  for approx 2 conversational turns. SLP then worked with pt on sentence level responses and pt averages low 70s dB. Pt reported he was very tired today after his other therapy/ies. SLP took pt outside to encourage carryover of louder speech and pt did very little talking and noted to scuff shoes for 90% of steps. SLP encouraged pt to take longer steps and lift his feet. Pt's verbalization was audible but likely sub-70dB.      Assessment / Recommendations / Plan   Plan  Continue with current plan of care         SLP Short Term Goals - 03/07/18 0831      SLP SHORT TERM GOAL #1   Title  pt will sustain loud /a/ at >90 dB at 30 cm over 3 consecutive sessions    Status  Achieved      SLP SHORT TERM GOAL #2   Title  pt will exhibit volume of 70dB in sentence responses 95% over 3 sessions    Status  Achieved      SLP SHORT TERM GOAL #3   Title  In 7 minutes simple conversation pt will achieve average speech volume of low 70s dB over two sessions    Status  Not Met  SLP SHORT TERM GOAL #4   Title  Pt will participate in objective assessment of swallow function (MBS or FEES) if warranted.    Status  Achieved      SLP SHORT TERM GOAL #5   Title  Pt will complete simple-mod complex naming tasks with 90% accuracy and rare min A     Time  --    Period  --    Status  Partially Met       SLP Long Term Goals - 03/07/18 7322      SLP LONG TERM GOAL #1   Title  pt will sustain loud /a/ at average >90dB at 30 cm over 5 consecutive sessions    Status  Achieved      SLP LONG TERM GOAL #2   Title  pt will generate speech in 10 minutes mod complex conversation with average >70dB over three sessions    Time  5    Period  Weeks    Status  On-going      SLP LONG TERM GOAL #3   Title  Pt will tell SLP 3 s/sx of aspiration PNA.    Time  5    Period  Weeks    Status  On-going      SLP LONG TERM GOAL #4   Title  Pt will utilize compensations for anomia in structured tasks with rare min A  over 4 sessions.     Time  5    Period  Weeks    Status  On-going      SLP LONG TERM GOAL #5   Title  pt will complete mod complex naming tasks with 80% success and rare min A    Time  5    Period  Weeks    Status  New   added 03-06-18        Patient will benefit from skilled therapeutic intervention in order to improve the following deficits and impairments:   Dysarthria and anarthria  Cognitive communication deficit  Dysphagia, oropharyngeal phase    Problem List Patient Active Problem List   Diagnosis Date Noted  . Strain of right biceps 09/12/2017  . Lymphedema 02/10/2017  . BPH associated with nocturia 05/16/2016  . Senile purpura ([place]) 05/03/2016  . OSA (obstructive sleep apnea) 12/28/2015  . Hypersomnia 11/15/2015  . Cough 11/15/2015  . 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 Dizziness 12/30/2014  . Diastolic dysfunction 02/54/2706  . Former smoker 04/29/2014  . Chronic venous insufficiency 02/20/2014  . Hyperglycemia 08/02/2012  . Obesity 07/13/2010  . History of malignant melanoma. left leg 03/09/2009  . Insomnia 10/03/2007  . Hyperlipidemia 12/13/2006  . Essential hypertension 12/13/2006    [facility] ,[place], [place]   03/07/2018, 8:34 AM  [facility] [address] [place], [place], [address] Phone: [phone]   Fax:  [phone]   Name: Nicholas [name] MRN: [id number] Date of Birth: 10/05/1945

## 2018-03-12 ENCOUNTER — Ambulatory Visit: Payer: PPO | Admitting: Speech Pathology

## 2018-03-12 ENCOUNTER — Ambulatory Visit: Payer: PPO | Admitting: Occupational Therapy

## 2018-03-12 ENCOUNTER — Encounter: Payer: Self-pay | Admitting: Occupational Therapy

## 2018-03-12 ENCOUNTER — Encounter: Payer: Self-pay | Admitting: Speech Pathology

## 2018-03-12 ENCOUNTER — Encounter: Payer: Self-pay | Admitting: Physical Therapy

## 2018-03-12 ENCOUNTER — Ambulatory Visit: Payer: PPO | Admitting: Physical Therapy

## 2018-03-12 DIAGNOSIS — R471 Dysarthria and anarthria: Secondary | ICD-10-CM

## 2018-03-12 DIAGNOSIS — R29898 Other symptoms and signs involving the musculoskeletal system: Secondary | ICD-10-CM

## 2018-03-12 DIAGNOSIS — R29818 Other symptoms and signs involving the nervous system: Secondary | ICD-10-CM

## 2018-03-12 DIAGNOSIS — R2681 Unsteadiness on feet: Secondary | ICD-10-CM

## 2018-03-12 DIAGNOSIS — R2689 Other abnormalities of gait and mobility: Secondary | ICD-10-CM

## 2018-03-12 DIAGNOSIS — R278 Other lack of coordination: Secondary | ICD-10-CM

## 2018-03-12 NOTE — Therapy (Signed)
[facility] [address] [place], [place], [address] Phone: [phone]   Fax:  [phone]  Speech Language Pathology Treatment  Patient Details  Name: Nicholas [name] MRN: [id number] Date of Birth: Aug 17, 1945 Referring Provider: [name]    Encounter Date: 03/12/2018  End of Session - 03/12/18 1342    Visit Number  9    Number of Visits  17    Date for SLP Re-Evaluation  04/19/18    Authorization Type  1 visit if OT/Nicholas [name]/ST on same day    Authorization Time Period  VL: medicare guidelines    SLP Start Time  0855    SLP Stop Time   0932    SLP Time Calculation (min)  37 min    Activity Tolerance  Patient tolerated treatment well       Past Medical History:  Diagnosis Date  . 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 Hyperlipidemia   . Hypertension   . Lymphedema     Past Surgical History:  Procedure Laterality Date  . 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 removal of melanoma of left foot/amputation of toes 1&25 Jul 2006  . WRIST FRACTURE SURGERY     72 years old-set    There were no vitals filed for this visit.  Subjective Assessment - 03/12/18 0859    Subjective  "I'm moving slow this morning" Nicholas [name] arrived 12 minutes late to therapy    Currently in Pain?  No/denies            ADULT SLP TREATMENT - 03/12/18 0902      General Information   Behavior/Cognition  Alert;Cooperative;Pleasant mood      Treatment Provided   Treatment provided  Cognitive-Linquistic      Cognitive-Linquistic Treatment   Treatment focused on  Dysarthria    Skilled Treatment  Nicholas [name] enters room with average of 67dB - Loud /a/ to recalibrate volume with average of 90dB mod I. In structure speech tasks Nicholas [name] average 70dB at sentence level with rare min A. Spontaneous conversation required usual min to mod A to achieve/maintain 70dB.       Assessment / Recommendations / Plan   Plan   Continue with current plan of care      Progression Toward Goals   Progression toward goals  Progressing toward goals         SLP Short Term Goals - 03/12/18 1340      SLP SHORT TERM GOAL #1   Title  Nicholas [name] will sustain loud /a/ at >90 dB at 30 cm over 3 consecutive sessions    Status  Achieved      SLP SHORT TERM GOAL #2   Title  Nicholas [name] will exhibit volume of 70dB in sentence responses 95% over 3 sessions    Status  Achieved      SLP SHORT TERM GOAL #3   Title  In 7 minutes simple conversation Nicholas [name] will achieve average speech volume of low 70s dB over two sessions    Status  Not Met      SLP SHORT TERM GOAL #4   Title  Nicholas [name] will participate in objective assessment of swallow function (MBS or FEES) if warranted.    Status  Achieved      SLP SHORT TERM GOAL #5   Title  Nicholas [name] will complete simple-mod complex naming tasks with 90% accuracy and rare  min A     Status  Partially Met       SLP Long Term Goals - 03/12/18 1341      SLP LONG TERM GOAL #1   Title  Nicholas [name] will sustain loud /a/ at average >90dB at 30 cm over 5 consecutive sessions    Status  Achieved      SLP LONG TERM GOAL #2   Title  Nicholas [name] will generate speech in 10 minutes mod complex conversation with average >70dB over three sessions    Time  4    Period  Weeks    Status  On-going      SLP LONG TERM GOAL #3   Title  Nicholas [name] will tell SLP 3 s/sx of aspiration PNA.    Time  4    Period  Weeks    Status  On-going      SLP LONG TERM GOAL #4   Title  Nicholas [name] will utilize compensations for anomia in structured tasks with rare min A over 4 sessions.     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 In structured tasks, Nicholas [name]  requires SLP A to average 70dB with sentence responses, however conversation [name] with low volume and reduced intelligilbility with spouse, family and friends. Continue skilled ST to maximize intelligibility for improved QOL and to reduce Nicholas [name] and spouse frustration.     Speech Therapy Frequency  2x / week    Treatment/Interventions  Aspiration precaution training;Diet toleration management by SLP;Trials of upgraded texture/liquids;Cognitive reorganization;Multimodal communcation approach;Compensatory strategies;Pharyngeal strengthening exercises;Language facilitation;Compensatory techniques;Cueing hierarchy;Internal/external aids;Functional tasks;SLP instruction and feedback;Patient/family education;Other (comment)    Potential to Achieve Goals  Good       Patient will benefit from skilled therapeutic intervention in order to improve the following deficits and impairments:   Dysarthria and anarthria    Problem List Patient Active Problem List   Diagnosis Date Noted  . Strain of right biceps 09/12/2017  . Lymphedema 02/10/2017  . BPH associated with nocturia 05/16/2016  . Senile purpura ([place]) 05/03/2016  . OSA (obstructive sleep apnea) 12/28/2015  . Hypersomnia 11/15/2015  . Cough 11/15/2015  . 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 Dizziness 12/30/2014  . Diastolic dysfunction 17/91/5056  . Former smoker 04/29/2014  . Chronic venous insufficiency 02/20/2014  . Hyperglycemia 08/02/2012  . Obesity 07/13/2010  . History of malignant melanoma. left leg 03/09/2009  . Insomnia 10/03/2007  . Hyperlipidemia 12/13/2006  . Essential hypertension 12/13/2006    , Annye Rusk MS, [place] 03/12/2018, 1:43 PM  [facility] [address] [place], [place], [address] Phone: [phone]   Fax:  [phone]   Name: Nicholas [name] MRN: [id number] Date of Birth: [birth year]

## 2018-03-12 NOTE — Therapy (Addendum)
[facility] [address] [place], [place], [address] Phone: [phone]   Fax:  [phone]  Occupational Therapy Treatment  Patient Details  Name: Nicholas [name] MRN: [id number] Date of Birth: Dec 23, 1945 Referring Provider: [name]   Encounter Date: 03/12/2018  OT End of Session - 03/12/18 0940    Visit Number  10    Number of Visits  17    Date for OT Re-Evaluation  03/09/18    Authorization Type  [employer] (Medicare)    Authorization Time Period  cert.  01/08/18-04/08/18 week 1    Authorization - Visit Number  10    Authorization - Number of Visits  20    OT Start Time  1017    OT Stop Time  1100    OT Time Calculation (min)  43 min    Activity Tolerance  Patient tolerated treatment well    Behavior During Therapy  WFL for tasks assessed/performed       Past Medical History:  Diagnosis Date  . 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 Hyperlipidemia   . Hypertension   . Lymphedema     Past Surgical History:  Procedure Laterality Date  . 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 removal of melanoma of left foot/amputation of toes 1&25 Jul 2006  . WRIST FRACTURE SURGERY     72 years old-set    There were no vitals filed for this visit.  Subjective Assessment - 03/12/18 0940    Subjective   I need to work on my writing, better but still hard    Pertinent History  hx of PD/Parkinson's Plus Syndrone; malignant melanoma, hyperlipidemia, HTN, insomnia, obesity, hyperglycemia, dizziness, OSA, urinary urgency, lymphedema    Patient Stated Goals  improve handwriting    Currently in Pain?  No/denies        Pt practiced writing, min v.c initally Pt demonstrates 100% legibility and min decrease in letter size.  Also practiced signature with min cueing and continuous "o and a" to work on timing, size, and legibility as pt with significantly decr legibility with  cursive.  Sliding cards off table by using PWR! Hands with focus on finger ext and mod cues for incr movement amplitude each hand.  Incorporated cognitive component by playing poker to incr engagement and improve timing and amplitude.  Playing go fish for cognitive component and in-hand manipulation/coodination.  Pt with min difficulty with coordination.  Pt also dealt and shuffled cards.                         OT Short Term Goals - 03/05/18 1116      OT SHORT TERM GOAL #1   Title  Pt will be independent with updated HEP.--check STGs 02/07/18    Status  Achieved      OT SHORT TERM GOAL #2   Title  Pt will be able to write short paragraph and sign name with 100% legibility and only minimal decr in size use AE/strategies.    Status  Achieved   02/28/18     OT SHORT TERM GOAL #3   Title  Pt will improve coordination for ADLs as shown by fastening/unfastening 3 buttons in 43sec or less.    Status  Achieved   02/28/18:  unable to test due to bandaid/cut on L thumb.  03/05/18:  34.12  OT SHORT TERM GOAL #4   Title  Pt will demonstrate improved dynamic standing balance as eveidenced by performing 10 inches or greater on standing functional reach.    Status  Achieved   02/28/18:  10" bilaterally       OT Long Term Goals - 03/05/18 1134      OT LONG TERM GOAL #1   Title  Pt will verbalize understanding of AE/strategies to incr safety, ease, and independence with ADLs/IADLs.--check LTGs 03/10/18    Time  8    Period  Weeks    Status  New      OT LONG TERM GOAL #2   Title  Pt will improve coordination for ADLs as shown by improving time on 9-hole peg test by at least 3sec bilaterally.    Baseline  R-33.69, L-30.69sec    Time  8    Period  Weeks    Status  New      OT LONG TERM GOAL #3   Title  Pt will improve coordination for ADLs as shown by fastening/unfastening 3 buttons in 40sec or less.    Baseline  47.37    Time  8    Period  Weeks    Status  Achieved    03/05/18     OT LONG TERM GOAL #4   Title  Pt will improve ability to dress as shown by improving time on PPT#4 by at least 3 sec and incorporate trunk rotation to decr risk for shoulder injury.    Baseline  19.68sec with no trunk rotation    Time  8    Period  Weeks    Status  New      OT LONG TERM GOAL #5   Title  Pt will improve bradykinesia/hypokinesia for functional reaching/coordination for ADLs as shown by improving score on box and blocks test by at least 3 with LUE.    Baseline  40 blocks    Time  8    Period  Weeks    Status  New            Plan - 03/12/18 0941    Clinical Impression Statement  Pt is progressing towards goals and demo improving coordination.  Pt continues to need cueing for impulsivity and timing of movement and freezing to improve efficiency and safety.  Reporting period 01/08/18-03/12/18.    Occupational performance deficits (Please refer to evaluation for details):  ADL's;IADL's;Leisure;Work;Rest and Sleep;Social Participation    Rehab Potential  Good    OT Frequency  2x / week    OT Duration  8 weeks    OT Treatment/Interventions  Self-care/ADL training;Therapeutic exercise;Visual/perceptual remediation/compensation;Patient/family education;Neuromuscular education;Moist Heat;Fluidtherapy;Energy conservation;Therapist, nutritional;Therapeutic activities;Balance training;Cognitive remediation/compensation;Passive range of motion;Manual Therapy;DME and/or AE instruction;Ultrasound;Contrast Bath;Cryotherapy    Plan  work towards ongoing long term goals, coordination, functional mobility, functional reaching    Consulted and Agree with Plan of Care  Patient       Patient will benefit from skilled therapeutic intervention in order to improve the following deficits and impairments:  Decreased cognition, Decreased knowledge of use of DME, Impaired vision/preception, Decreased mobility, Decreased coordination, Decreased activity tolerance, Decreased range  of motion, Impaired tone, Impaired UE functional use, Impaired perceived functional ability, Decreased safety awareness, Decreased knowledge of precautions, Decreased balance  Visit Diagnosis: Other symptoms and signs involving the nervous system  Other abnormalities of gait and mobility  Unsteadiness on feet  Other lack of coordination  Other symptoms and signs involving the musculoskeletal  system    Problem List Patient Active Problem List   Diagnosis Date Noted  . Strain of right biceps 09/12/2017  . Lymphedema 02/10/2017  . BPH associated with nocturia 05/16/2016  . Senile purpura ([place]) 05/03/2016  . OSA (obstructive sleep apnea) 12/28/2015  . Hypersomnia 11/15/2015  . Cough 11/15/2015  . 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 Dizziness 12/30/2014  . Diastolic dysfunction 83/03/4075  . Former smoker 04/29/2014  . Chronic venous insufficiency 02/20/2014  . Hyperglycemia 08/02/2012  . Obesity 07/13/2010  . History of malignant melanoma. left leg 03/09/2009  . Insomnia 10/03/2007  . Hyperlipidemia 12/13/2006  . Essential hypertension 12/13/2006    [facility] 03/12/2018, 5:50 PM  [facility] [address] [place], [place], [address] Phone: [phone]   Fax:  [phone]  Name: AIRIK GOODLIN MRN: [id number] Date of Birth: 05-13-1946   Vianne Bulls, OTR/L [facility] [address] [place], [place]  [address] [phone] phone [phone] 03/12/18 5:50 PM

## 2018-03-13 NOTE — Therapy (Signed)
[facility] [address] [place], [place], [address] Phone: [phone]   Fax:  [phone]  Physical Therapy Treatment  Patient Details  Name: Nicholas [name] MRN: [id number] Date of Birth: [birth year] Referring Provider: [name]   Encounter Date: 03/12/2018  PT End of Session - 03/13/18 1216    Visit Number  11    Number of Visits  17    Date for PT Re-Evaluation  04/08/18    Authorization Type  [employer] (Medicare)-will need 10th visit progress note    Authorization Time Period  cert 2/99/24-2/68/34    PT Start Time  0936    PT Stop Time  1016    PT Time Calculation (min)  40 min    Activity Tolerance  Patient tolerated treatment well    Behavior During Therapy  [facility] for tasks assessed/performed       Past Medical History:  Diagnosis Date  . 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 Hyperlipidemia   . Hypertension   . Lymphedema     Past Surgical History:  Procedure Laterality Date  . 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 removal of melanoma of left foot/amputation of toes 1&25 Jul 2006  . WRIST FRACTURE SURGERY     72 years old-set    There were no vitals filed for this visit.  Subjective Assessment - 03/12/18 0936    Subjective  Went to the wedding and I froze up as soon as I got in the church.  But I sat on the back row, and it all worked out okay.    Pertinent History  Lymphedema-has compression machine and uses stockings (wife helps); hx of PD/Parkinson's Plus Syndrone; malignant melanoma, hyperlipidemia, HTN, insomnia, obesity, hyperglycemia, dizziness, OSA, urinary urgency    Patient Stated Goals  Pt's goals for therapy are to "be like I used to be."    Currently in Pain?  No/denies                       [facility] Adult PT Treatment/Exercise - 03/13/18 1201      Transfers   Transfers  Sit to Stand;Stand to Sit    Sit to Stand   5: Supervision;With upper extremity assist;Without upper extremity assist;From bed    Stand to Sit  5: Supervision;With upper extremity assist;Without upper extremity assist;To bed    Stand to Sit Details (indicate cue type and reason)  Verbal cues for sequencing;Verbal cues for technique    Stand to Sit Details  Cues for increased forward lean to initiate standing     Number of Reps  10 reps;Other sets (comment)    Comments  See Neuro-reed details below      Ambulation/Gait   Ambulation/Gait  Yes    Ambulation/Gait Assistance  5: Supervision    Ambulation Distance (Feet)  200 Feet   x 3 reps   Assistive device  Rollator    Gait Pattern  Step-through pattern;Decreased step length - right;Decreased step length - left;Decreased dorsiflexion - right;Decreased dorsiflexion - left;Poor foot clearance - left;Poor foot clearance - right;Festinating    Ambulation Surface  Indoor    Pre-Gait Activities  At locked rollator:  marching in place x 10 reps, 2 sets; stagger stance forward/back weightshifting x 10 reps, 2 sets; then forward step and weightshift x 10 reps.    Gait Comments  Negotiated  narrow spaces, turns, quick start/stops.  Pt experiences freezing of gait episodes upon turns and going into narrow spaces.  He is able to stop and shift his weigth until he can initiate gait again.  PT provides cues for how he can "anticipate" freezing episode areas and pre-plan to stop and reset again with BIG steps.  PT does provide frequent postural cues with gait and standing today.      Neuro Re-ed    Neuro Re-ed Details   Pt performs 10 reps of sit<>stand, with each rep followed by PWR! Up for upright posture, then 10 reps sit<>stand with each rep followed by PWR! rock and reach for improved weightshifting, trunk flexibility; 10 reps sit<>stand with each rep followed by modified PWR! Twist for trunk flexibility, then 5 reps of sit<>stand with each followed by PWR! Step to side for improved initiation of  stepping.       Self Care: Discussed how patient can be his best advocate in social situations (where people are asking what they can do to help), in order for him to verbalize how he can move best when freezing episodes occur.  Discussed options for community mobility, including wheelchair or transport chair (to help with allowing patient to continue community mobility, versus pulling away from community activities), and pt is NOT interested in pursuing that type of mobility at this time.        PT Short Term Goals - 02/28/18 2005      PT SHORT TERM GOAL #1   Title  Pt will be independent with HEP for improved balance, transfers and gait.  Updated TARGET 03/01/18    Time  4    Period  Weeks    Status  Achieved      PT SHORT TERM GOAL #2   Title  Pt will improve TUG score to less than or equal to 50 seconds for decreased fall risk.    Baseline  02/28/18 34.5 sec    Time  4    Period  Weeks    Status  Achieved      PT SHORT TERM GOAL #3   Title  Pt wil perform at least 8 of 10 reps of sit<>stand transfers from 18 inch surfaces, with no posterior lean, independently for improved transfer efficiency and safety.    Baseline  8/8 9 of 10    Time  4    Period  Weeks    Status  Achieved      PT SHORT TERM GOAL #4   Title  Pt will verbalize undersatnding of techniques to reduce freezing episodes with gait and turns    Baseline  02/28/18 pt able to verbalize (continues to need vc at times to implement)    Time  4    Period  Weeks    Status  Achieved        PT Long Term Goals - 02/05/18 2122      PT LONG TERM GOAL #1   Title  Pt will verbalize understanding of fall prevention within the home environment.  TARGET 03/29/18    Time  8    Period  Weeks    Status  On-going    Target Date  03/29/18      PT LONG TERM GOAL #2   Title  Pt will improve TUG score to less than or equal to 35 seconds for decreased fall risk.    Time  8    Period  Weeks    Status  On-going    Target Date   03/29/18      PT LONG TERM GOAL #3   Title  Pt will improve gait velocity with conversation to at least 2.62 ft/sec for improved gait efficiency and safety.    Time  8    Period  Weeks    Status  On-going    Target Date  03/29/18      PT LONG TERM GOAL #4   Title  Pt will verbalize plans for continued community fitness upon d/c from PT.    Time  8    Status  On-going    Target Date  03/29/18            Plan - 03/13/18 1216    Clinical Impression Statement  Skilled PT session focused today on sit<>stand repeated transfers with PWR! MOves in standing in between to focus on functional strengthening and posture, weightshifting, trunk flexibility and initial stepping.  Continued pre-gait and gait activities today with pt overall moving well with pregait activities.  He does expercience some freezing epsiodes and reports its much worse in crowds.  Discussed anticipatory strategies for typical freezing areas.  Pt will continue to benefit from skilled PT to address balance, gait and posture.    Rehab Potential  Good    Clinical Impairments Affecting Rehab Potential  for PT goals/tx plan    PT Frequency  2x / week    PT Duration  8 weeks   plus eval   PT Treatment/Interventions  ADLs/Self Care Home Management;DME Instruction;Gait training;Functional mobility training;Therapeutic activities;Therapeutic exercise;Balance training;Patient/family education;Neuromuscular re-education    PT Next Visit Plan  Discuss fall prevention, sit<>stand with PWR! Moves, turns and obstacles, gait with conversation tasks    Consulted and Agree with Plan of Care  Patient       Patient will benefit from skilled therapeutic intervention in order to improve the following deficits and impairments:  Abnormal gait, Decreased balance, Decreased mobility, Difficulty walking, Decreased safety awareness, Decreased strength, Postural dysfunction  Visit Diagnosis: Other abnormalities of gait and  mobility  Unsteadiness on feet     Problem List Patient Active Problem List   Diagnosis Date Noted  . Strain of right biceps 09/12/2017  . Lymphedema 02/10/2017  . BPH associated with nocturia 05/16/2016  . Senile purpura ([place]) 05/03/2016  . OSA (obstructive sleep apnea) 12/28/2015  . Hypersomnia 11/15/2015  . Cough 11/15/2015  . 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 Dizziness 12/30/2014  . Diastolic dysfunction 56/21/3086  . Former smoker 04/29/2014  . Chronic venous insufficiency 02/20/2014  . Hyperglycemia 08/02/2012  . Obesity 07/13/2010  . History of malignant melanoma. left leg 03/09/2009  . Insomnia 10/03/2007  . Hyperlipidemia 12/13/2006  . Essential hypertension 12/13/2006    Jennipher Weatherholtz W. 03/13/2018, 12:20 PM  [name]., PT   [facility] [address] [place], [place], [address] Phone: [phone]   Fax:  [phone]  Name: EYOEL THROGMORTON MRN: [id number] Date of Birth: Dec 11, 1945

## 2018-03-15 ENCOUNTER — Ambulatory Visit: Payer: PPO | Admitting: Physical Therapy

## 2018-03-15 ENCOUNTER — Ambulatory Visit: Payer: PPO | Admitting: Occupational Therapy

## 2018-03-15 ENCOUNTER — Ambulatory Visit: Payer: PPO

## 2018-03-15 DIAGNOSIS — R2681 Unsteadiness on feet: Secondary | ICD-10-CM

## 2018-03-15 DIAGNOSIS — R1312 Dysphagia, oropharyngeal phase: Secondary | ICD-10-CM

## 2018-03-15 DIAGNOSIS — R29818 Other symptoms and signs involving the nervous system: Secondary | ICD-10-CM

## 2018-03-15 DIAGNOSIS — R2689 Other abnormalities of gait and mobility: Secondary | ICD-10-CM

## 2018-03-15 DIAGNOSIS — M6281 Muscle weakness (generalized): Secondary | ICD-10-CM

## 2018-03-15 DIAGNOSIS — R41841 Cognitive communication deficit: Secondary | ICD-10-CM

## 2018-03-15 DIAGNOSIS — R29898 Other symptoms and signs involving the musculoskeletal system: Secondary | ICD-10-CM

## 2018-03-15 DIAGNOSIS — R278 Other lack of coordination: Secondary | ICD-10-CM

## 2018-03-15 DIAGNOSIS — R471 Dysarthria and anarthria: Secondary | ICD-10-CM

## 2018-03-15 NOTE — Patient Instructions (Signed)
Tips for Talking with People who have Aphasia  . Say one thing at a time . Don't  rush - slow down, be patient . Talk face to face . Reduce background noise . Relax - be natural . Use pen and paper . Write down key words . Draw diagrams or pictures . Don't pretend you understand . Ask what helps . Recap - check you both understand . Be a partner, not a therapist   Aphasia does not affect intelligence, only language. The person with aphasia can still: make decisions, have opinions, and socialize.   Describing words  What group does it belong to?  What do I use it for?  Where can I find it?  What does it LOOK like?  What other words go with it?  What is the 1st sound of the word?       There's an App for that:  [employer],  Heads up,  Taboo,  Scientist, clinical (histocompatibility and immunogenetics)

## 2018-03-15 NOTE — Therapy (Signed)
[facility] [address] [place], [place], [address] Phone: [phone]   Fax:  [phone]  Physical Therapy Treatment  Patient Details  Name: Nicholas [name] MRN: [id number] Date of Birth: June 09, 1946 Referring Provider: [name]   Encounter Date: 03/15/2018  PT End of Session - 03/15/18 1306    Visit Number  12    Number of Visits  17    Date for PT Re-Evaluation  04/08/18    Authorization Type  [employer] (Medicare)-will need 10th visit progress note    Authorization Time Period  cert 6/96/29-12/19/39    PT Start Time  1018    PT Stop Time  1100    PT Time Calculation (min)  42 min    Activity Tolerance  Patient tolerated treatment well    Behavior During Therapy  [facility] for tasks assessed/performed       Past Medical History:  Diagnosis Date  . Cancer [facility]) Jan 2008   skin; hx of melanoma left foot/ amputation of toes 1&2   . Hyperlipidemia   . Hypertension   . Lymphedema     Past Surgical History:  Procedure Laterality Date  . 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 removal of melanoma of left foot/amputation of toes 1&25 Jul 2006  . WRIST FRACTURE SURGERY     72 years old-set    There were no vitals filed for this visit.                    [facility] Adult PT Treatment/Exercise - 03/15/18 0001      Transfers   Transfers  Sit to Stand;Stand to Sit    Sit to Stand  6: Modified independent (Device/Increase time);Without upper extremity assist;From bed    Stand to Sit  5: Supervision;Without upper extremity assist;To chair/3-in-1;Uncontrolled descent    Stand to Sit Details  Verbal cues for controlled descent into sitting    Number of Reps  Other sets (comment);Other reps (comment)    Comments  See Neuro-reed details below      Ambulation/Gait   Ambulation/Gait  Yes    Ambulation/Gait Assistance  5: Supervision    Ambulation/Gait Assistance Details  Occasional cues for rocking  back onto heels for improved posture for initiating gait; cues to slow pace of gait (increase stance time on each foot to allow for improved step length).    Ambulation Distance (Feet)  400 Feet   then 250 ft outdoors   Assistive device  Rollator    Gait Pattern  Step-through pattern;Decreased step length - right;Decreased step length - left;Decreased dorsiflexion - right;Decreased dorsiflexion - left;Poor foot clearance - left;Poor foot clearance - right;Festinating    Ambulation Surface  Indoor;Outdoor;Unlevel    Door Management  4: Min assist    Door Managment Details (indicate cue type and reason)  Pt attempts to negotiate back door into the gym and needs assist from therapist to hold open door due to significant freezing episode in doorway.    Pre-Gait Activities  At locked rollator:  marching in place, forward step and weightshift, stagger stance forward and back weightshift, at least 10 reps each prior to gait.    Gait Comments  Gait indoors and outdoors with conversation tasks today.  >50 % of the time with conversation, pt either stops to talk or he slows significantly to talk.        Self-Care   Self-Care  Other Self-Care Comments  Other Self-Care Comments   Discussed fall prevention in home environment      Neuro Re-ed    Neuro Re-ed Details   Pt performs 10 reps of sit<>stand, with each rep followed by PWR! Up for upright posture, then 10 reps sit<>stand with each rep followed by PWR! rock and reach for improved weightshifting, trunk flexibility; 10 reps sit<>stand with each rep followed by modified PWR! Twist for trunk flexibility, then 5 reps of sit<>stand with each followed by PWR! Step to side for improved initiation of stepping.             PT Education - 03/15/18 1310    Education Details  Fall prevention education discussed (did not have time to give handout)    Person(s) Educated  Patient    Methods  Explanation    Comprehension  Verbalized understanding        PT Short Term Goals - 02/28/18 2005      PT SHORT TERM GOAL #1   Title  Pt will be independent with HEP for improved balance, transfers and gait.  Updated TARGET 03/01/18    Time  4    Period  Weeks    Status  Achieved      PT SHORT TERM GOAL #2   Title  Pt will improve TUG score to less than or equal to 50 seconds for decreased fall risk.    Baseline  02/28/18 34.5 sec    Time  4    Period  Weeks    Status  Achieved      PT SHORT TERM GOAL #3   Title  Pt wil perform at least 8 of 10 reps of sit<>stand transfers from 18 inch surfaces, with no posterior lean, independently for improved transfer efficiency and safety.    Baseline  8/8 9 of 10    Time  4    Period  Weeks    Status  Achieved      PT SHORT TERM GOAL #4   Title  Pt will verbalize undersatnding of techniques to reduce freezing episodes with gait and turns    Baseline  02/28/18 pt able to verbalize (continues to need vc at times to implement)    Time  4    Period  Weeks    Status  Achieved        PT Long Term Goals - 02/05/18 2122      PT LONG TERM GOAL #1   Title  Pt will verbalize understanding of fall prevention within the home environment.  TARGET 03/29/18    Time  8    Period  Weeks    Status  On-going    Target Date  03/29/18      PT LONG TERM GOAL #2   Title  Pt will improve TUG score to less than or equal to 35 seconds for decreased fall risk.    Time  8    Period  Weeks    Status  On-going    Target Date  03/29/18      PT LONG TERM GOAL #3   Title  Pt will improve gait velocity with conversation to at least 2.62 ft/sec for improved gait efficiency and safety.    Time  8    Period  Weeks    Status  On-going    Target Date  03/29/18      PT LONG TERM GOAL #4   Title  Pt will verbalize plans for continued community fitness upon  d/c from PT.    Time  8    Status  On-going    Target Date  03/29/18            Plan - 03/15/18 1307    Clinical Impression Statement  Skilled PT session  focused today again on repeated sit<>stand with PWR! Moves upon standing, for improved functional strengthening with posture, weightshifting, trunk rotation and initial stepping.  Also focused on gait with conversation tasks today, with pt having to stop walking to complet conversation >50% of the time; pt expresses difficulty with negotiating doorways with walker, noted wtih coming into gym from outdoor walking.  Pt will continue to benefit from skilled PT to address gait and functional strengthening.    Rehab Potential  Good    Clinical Impairments Affecting Rehab Potential  for PT goals/tx plan    PT Frequency  2x / week    PT Duration  8 weeks   plus eval   PT Treatment/Interventions  ADLs/Self Care Home Management;DME Instruction;Gait training;Functional mobility training;Therapeutic activities;Therapeutic exercise;Balance training;Patient/family education;Neuromuscular re-education    PT Next Visit Plan  Add to HEP:  sit<>stand with PWR! Moves; work on Merchand[employer], retail, gait with conversation tasks    Consulted and Agree with Plan of Care  Patient       Patient will benefit from skilled therapeutic intervention in order to improve the following deficits and impairments:  Abnormal gait, Decreased balance, Decreased mobility, Difficulty walking, Decreased safety awareness, Decreased strength, Postural dysfunction  Visit Diagnosis: Other abnormalities of gait and mobility  Unsteadiness on feet  Other symptoms and signs involving the nervous system     Problem List Patient Active Problem List   Diagnosis Date Noted  . Strain of right biceps 09/12/2017  . Lymphedema 02/10/2017  . BPH associated with nocturia 05/16/2016  . Senile purpura ([place]) 05/03/2016  . OSA (obstructive sleep apnea) 12/28/2015  . Hypersomnia 11/15/2015  . Cough 11/15/2015  . 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 Dizziness 12/30/2014  . Diastolic dysfunction 52/77/8242  . Former smoker 04/29/2014   . Chronic venous insufficiency 02/20/2014  . Hyperglycemia 08/02/2012  . Obesity 07/13/2010  . History of malignant melanoma. left leg 03/09/2009  . Insomnia 10/03/2007  . Hyperlipidemia 12/13/2006  . Essential hypertension 12/13/2006    Blayden Conwell W. 03/15/2018, 1:11 PM [name]., PT  [facility] [address] [place], [place], [address] Phone: [phone]   Fax:  [phone]  Name: JASH WAHLEN MRN: [id number] Date of Birth: 01-10-1946

## 2018-03-15 NOTE — Therapy (Addendum)
[facility] [address] [place], [place], [address] Phone: [phone]   Fax:  [phone]  Speech Language Pathology Treatment  Patient Details  Name: Nicholas [name] MRN: [id number] Date of Birth: 1945/09/04 Referring Provider: [name]    Encounter Date: 03/15/2018  End of Session - 03/15/18 1256    Visit Number  10    Number of Visits  17    Date for SLP Re-Evaluation  04/19/18    SLP Start Time  1148    SLP Stop Time   1230    SLP Time Calculation (min)  42 min    Activity Tolerance  Patient tolerated treatment well       Past Medical History:  Diagnosis Date  . Cancer [facility]) Jan 2008   skin; hx of melanoma left foot/ amputation of toes 1&2   . Hyperlipidemia   . Hypertension   . Lymphedema     Past Surgical History:  Procedure Laterality Date  . 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 removal of melanoma of left foot/amputation of toes 1&25 Jul 2006  . WRIST FRACTURE SURGERY     72 years old-set    There were no vitals filed for this visit.  Subjective Assessment - 03/15/18 1155    Subjective  "They wore me out."    Currently in Pain?  No/denies            ADULT SLP TREATMENT - 03/15/18 1202      General Information   Behavior/Cognition  Alert;Cooperative;Pleasant mood      Treatment Provided   Treatment provided  Cognitive-Linquistic      Cognitive-Linquistic Treatment   Treatment focused on  Dysarthria    Skilled Treatment  Pt tells SLP of an incident this morning of anomia with a client. SLP reviewed each compensation strategy with pt (description, synonym, and circumlocution). SLP re-explained each strategy and gave examples of each. SLP provided pt with examples of apps/games that are good for word finding.      Assessment / Recommendations / Plan   Plan  Continue with current plan of care      Progression Toward Goals   Progression toward goals  Progressing toward goals        SLP Education - 03/15/18 1256    Education Details  anomia apps, compensations for anomia    Person(s) Educated  Patient    Methods  Demonstration;Explanation;Handout    Comprehension  Verbalized understanding;Need further instruction       SLP Short Term Goals - 03/12/18 1340      SLP SHORT TERM GOAL #1   Title  pt will sustain loud /a/ at >90 dB at 30 cm over 3 consecutive sessions    Status  Achieved      SLP SHORT TERM GOAL #2   Title  pt will exhibit volume of 70dB in sentence responses 95% over 3 sessions    Status  Achieved      SLP SHORT TERM GOAL #3   Title  In 7 minutes simple conversation pt will achieve average speech volume of low 70s dB over two sessions    Status  Not Met      SLP SHORT TERM GOAL #4   Title  Pt will participate in objective assessment of swallow function (MBS or FEES) if warranted.    Status  Achieved      SLP SHORT TERM GOAL #5   Title  Pt will complete simple-mod complex naming tasks with 90% accuracy and rare min A     Status  Partially Met       SLP Long Term Goals - 03/15/18 1259      SLP LONG TERM GOAL #1   Title  pt will sustain loud /a/ at average >90dB at 30 cm over 5 consecutive sessions    Status  Achieved      SLP LONG TERM GOAL #2   Title  pt will generate speech in 10 minutes simple to mod complex conversation with average >70dB over three sessions    Time  4    Period  Weeks    Status  Revised      SLP LONG TERM GOAL #3   Title  Pt will tell SLP 3 s/sx of aspiration PNA.    Time  4    Period  Weeks    Status  On-going      SLP LONG TERM GOAL #4   Title  Pt will utilize compensations for anomia in structured tasks with rare min A over 4 sessions.     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 In structured tasks, he requires SLP A to obtain WNL loundess in sentence responses. Pt reports a troubling incident today with anomia so SLP discussed word finding compensations, explained rationale for each, and shared apps/games for anomia pt could play by himself or with family. Continue skilled ST to maximize intelligibility for improved QOL and to reduce pt and spouse frustration.     Speech Therapy Frequency  2x / week    Treatment/Interventions  Aspiration precaution training;Diet toleration management by SLP;Trials of upgraded texture/liquids;Cognitive reorganization;Multimodal communcation approach;Compensatory strategies;Pharyngeal strengthening exercises;Language facilitation;Compensatory techniques;Cueing hierarchy;Internal/external aids;Functional tasks;SLP instruction and feedback;Patient/family education;Other (comment)    Potential to Achieve Goals  Good       Patient will benefit from skilled therapeutic intervention in order to improve the following deficits and impairments:   Dysarthria and anarthria  Cognitive communication deficit  Dysphagia, oropharyngeal phase    Speech Therapy Progress Note  Dates of Reporting Period: eval date to 03-15-18  Objective Reports of Subjective Statement: Pt has been seen for 10 visits of ST focusing on language and speech skills  Objective Measurements: Pt's loud /a/ has improved to average low 90s.  Goal Update: See above.  Plan: Cont with skilled ST.  Reason Skilled Services are Required: Pt has not reached max rehab potential and still requires SLP cues for louder speech and use of compensatory language strategies.    Problem List Patient Active Problem List   Diagnosis Date Noted  . Strain of right biceps 09/12/2017  . Lymphedema 02/10/2017  . BPH associated with nocturia 05/16/2016  . Senile purpura ([place])  05/03/2016  . OSA (obstructive sleep apnea) 12/28/2015  . Hypersomnia 11/15/2015  . Cough 11/15/2015  . 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 Dizziness 12/30/2014  . Diastolic dysfunction 63/07/6008  . Former smoker 04/29/2014  . Chronic venous  insufficiency 02/20/2014  . Hyperglycemia 08/02/2012  . Obesity 07/13/2010  . History of malignant melanoma. left leg 03/09/2009  . Insomnia 10/03/2007  . Hyperlipidemia 12/13/2006  . Essential hypertension 12/13/2006    [facility] ,[place], [place]  03/15/2018, 1:15 PM  [facility] [address] [place], [place], [address] Phone: [phone]   Fax:  [phone]   Name: LIONELL MATUSZAK MRN: [id number] Date of Birth: 09/28/1945

## 2018-03-15 NOTE — Therapy (Signed)
[facility] [address] [place], [place], [address] Phone: [phone]   Fax:  [phone]  Occupational Therapy Treatment  Patient Details  Name: Nicholas [name] MRN: [id number] Date of Birth: 1945-08-03 Referring Provider: [name]   Encounter Date: 03/15/2018  OT End of Session - 03/15/18 1305    Visit Number  11    Number of Visits  17    Date for OT Re-Evaluation  04/08/18   end date extended due to late start   Authorization Type  [employer] (Medicare)    Authorization Time Period  cert.  01/08/18-04/08/18 week 1    Authorization - Visit Number  11    Authorization - Number of Visits  20    OT Start Time  3419    OT Stop Time  1145    OT Time Calculation (min)  40 min    Activity Tolerance  Patient tolerated treatment well    Behavior During Therapy  WFL for tasks assessed/performed       Past Medical History:  Diagnosis Date  . 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 Hyperlipidemia   . Hypertension   . Lymphedema     Past Surgical History:  Procedure Laterality Date  . 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 removal of melanoma of left foot/amputation of toes 1&25 Jul 2006  . WRIST FRACTURE SURGERY     72 years old-set    There were no vitals filed for this visit.  Subjective Assessment - 03/15/18 1308    Pertinent History  hx of PD/Parkinson's Plus Syndrone; malignant melanoma, hyperlipidemia, HTN, insomnia, obesity, hyperglycemia, dizziness, OSA, urinary urgency, lymphedema    Patient Stated Goals  improve handwriting    Currently in Pain?  No/denies              Treatment: PWR! Modified quadraped 10-20 reps each for warm up, min facillitation and v.c Dynamic step and reach while flipping large playing cards, min-mod v.c for larger steps.  Donning and doffing a jacket with good time and technique, pt met his long term goal.   Handwriting activities with good legibility and letter size.               OT Short Term Goals - 03/05/18 1116      OT SHORT TERM GOAL #1   Title  Pt will be independent with updated HEP.--check STGs 02/07/18    Status  Achieved      OT SHORT TERM GOAL #2   Title  Pt will be able to write short paragraph and sign name with 100% legibility and only minimal decr in size use AE/strategies.    Status  Achieved   02/28/18     OT SHORT TERM GOAL #3   Title  Pt will improve coordination for ADLs as shown by fastening/unfastening 3 buttons in 43sec or less.    Status  Achieved   02/28/18:  unable to test due to bandaid/cut on L thumb.  03/05/18:  34.12     OT SHORT TERM GOAL #4   Title  Pt will demonstrate improved dynamic standing balance as eveidenced by performing 10 inches or greater on standing functional reach.    Status  Achieved   02/28/18:  10" bilaterally       OT Long Term Goals - 03/15/18 1140      OT LONG  TERM GOAL #1   Title  Pt will verbalize understanding of AE/strategies to incr safety, ease, and independence with ADLs/IADLs.-    Status  On-going      OT LONG TERM GOAL #2   Title  Pt will improve coordination for ADLs as shown by improving time on 9-hole peg test by at least 3sec bilaterally.    Status  On-going      OT LONG TERM GOAL #3   Title  Pt will improve coordination for ADLs as shown by fastening/unfastening 3 buttons in 40sec or less.    Status  Achieved      OT LONG TERM GOAL #4   Title  Pt will improve ability to dress as shown by improving time on PPT#4 by at least 3 sec and incorporate trunk rotation to decr risk for shoulder injury.    Status  Achieved   13.25     OT LONG TERM GOAL #5   Title  Pt will improve bradykinesia/hypokinesia for functional reaching/coordination for ADLs as shown by improving score on box and blocks test by at least 3 with LUE.    Status  On-going            Plan - 03/15/18 1306    Clinical Impression  Statement  pt is progressing towards goals. He reports he has ordered a TEd hose donner and he anticipates he will have it in time for his next visit.    Occupational performance deficits (Please refer to evaluation for details):  ADL's;IADL's;Leisure;Work;Rest and Sleep;Social Participation    Rehab Potential  Good    OT Frequency  2x / week    OT Duration  8 weeks    OT Treatment/Interventions  Self-care/ADL training;Therapeutic exercise;Visual/perceptual remediation/compensation;Patient/family education;Neuromuscular education;Moist Heat;Fluidtherapy;Energy conservation;Therapist, nutritional;Therapeutic activities;Balance training;Cognitive remediation/compensation;Passive range of motion;Manual Therapy;DME and/or AE instruction;Ultrasound;Contrast Bath;Cryotherapy    Plan  work towards ongoing long term goals, coordination, functional mobility, functional reaching    Consulted and Agree with Plan of Care  Patient       Patient will benefit from skilled therapeutic intervention in order to improve the following deficits and impairments:  Decreased cognition, Decreased knowledge of use of DME, Impaired vision/preception, Decreased mobility, Decreased coordination, Decreased activity tolerance, Decreased range of motion, Impaired tone, Impaired UE functional use, Impaired perceived functional ability, Decreased safety awareness, Decreased knowledge of precautions, Decreased balance  Visit Diagnosis: No diagnosis found.    Problem List Patient Active Problem List   Diagnosis Date Noted  . Strain of right biceps 09/12/2017  . Lymphedema 02/10/2017  . BPH associated with nocturia 05/16/2016  . Senile purpura ([place]) 05/03/2016  . OSA (obstructive sleep apnea) 12/28/2015  . Hypersomnia 11/15/2015  . Cough 11/15/2015  . 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 Dizziness 12/30/2014  . Diastolic dysfunction 09/32/6712  . Former smoker 04/29/2014  . Chronic venous insufficiency  02/20/2014  . Hyperglycemia 08/02/2012  . Obesity 07/13/2010  . History of malignant melanoma. left leg 03/09/2009  . Insomnia 10/03/2007  . Hyperlipidemia 12/13/2006  . Essential hypertension 12/13/2006    [name] 03/15/2018, 1:08 PM  [facility] [address] [place], [place], [address] Phone: [phone]   Fax:  [phone]  Name: VEGAS FRITZE MRN: [id number] Date of Birth: [birth year]

## 2018-03-19 ENCOUNTER — Encounter: Payer: PPO | Admitting: Speech Pathology

## 2018-03-19 ENCOUNTER — Ambulatory Visit: Payer: PPO | Admitting: Physical Therapy

## 2018-03-19 ENCOUNTER — Encounter: Payer: PPO | Admitting: Occupational Therapy

## 2018-03-20 ENCOUNTER — Ambulatory Visit: Payer: PPO | Admitting: Physical Therapy

## 2018-03-20 ENCOUNTER — Encounter: Payer: PPO | Admitting: Occupational Therapy

## 2018-03-26 ENCOUNTER — Ambulatory Visit: Payer: PPO | Admitting: Physical Therapy

## 2018-03-26 ENCOUNTER — Encounter: Payer: Self-pay | Admitting: Occupational Therapy

## 2018-03-26 ENCOUNTER — Encounter: Payer: Self-pay | Admitting: Physical Therapy

## 2018-03-26 ENCOUNTER — Ambulatory Visit: Payer: PPO | Attending: Family Medicine | Admitting: Occupational Therapy

## 2018-03-26 ENCOUNTER — Ambulatory Visit: Payer: PPO

## 2018-03-26 DIAGNOSIS — R278 Other lack of coordination: Secondary | ICD-10-CM | POA: Diagnosis not present

## 2018-03-26 DIAGNOSIS — R29818 Other symptoms and signs involving the nervous system: Secondary | ICD-10-CM | POA: Diagnosis not present

## 2018-03-26 DIAGNOSIS — R2689 Other abnormalities of gait and mobility: Secondary | ICD-10-CM | POA: Diagnosis not present

## 2018-03-26 DIAGNOSIS — R2681 Unsteadiness on feet: Secondary | ICD-10-CM

## 2018-03-26 DIAGNOSIS — R471 Dysarthria and anarthria: Secondary | ICD-10-CM | POA: Diagnosis not present

## 2018-03-26 DIAGNOSIS — R41841 Cognitive communication deficit: Secondary | ICD-10-CM

## 2018-03-26 DIAGNOSIS — R29898 Other symptoms and signs involving the musculoskeletal system: Secondary | ICD-10-CM | POA: Diagnosis not present

## 2018-03-26 DIAGNOSIS — R1312 Dysphagia, oropharyngeal phase: Secondary | ICD-10-CM | POA: Insufficient documentation

## 2018-03-26 DIAGNOSIS — M6281 Muscle weakness (generalized): Secondary | ICD-10-CM | POA: Insufficient documentation

## 2018-03-26 NOTE — Therapy (Signed)
[facility] [address] [place], [place], [address] Phone: [phone]   Fax:  [phone]  Physical Therapy Treatment  Patient Details  Name: Nicholas [name] MRN: [id number] Date of Birth: 03-31-1946 Referring Provider: [name]   Encounter Date: 03/26/2018  PT End of Session - 03/26/18 1440    Visit Number  13    Number of Visits  17    Date for PT Re-Evaluation  04/08/18    Authorization Type  [employer] (Medicare)-will need 10th visit progress note    Authorization Time Period  cert 2/35/57-10/12/00    PT Start Time  1018    PT Stop Time  1100    PT Time Calculation (min)  42 min    Activity Tolerance  Patient tolerated treatment well    Behavior During Therapy  [facility] for tasks assessed/performed       Past Medical History:  Diagnosis Date  . 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 Hyperlipidemia   . Hypertension   . Lymphedema     Past Surgical History:  Procedure Laterality Date  . 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 removal of melanoma of left foot/amputation of toes 1&25 Jul 2006  . WRIST FRACTURE SURGERY     72 years old-set    There were no vitals filed for this visit.  Subjective Assessment - 03/26/18 1020    Subjective  No changes.     Pertinent History  Lymphedema-has compression machine and uses stockings (wife helps); hx of PD/Parkinson's Plus Syndrone; malignant melanoma, hyperlipidemia, HTN, insomnia, obesity, hyperglycemia, dizziness, OSA, urinary urgency    Patient Stated Goals  Pt's goals for therapy are to "be like I used to be."    Currently in Pain?  No/denies           Treatment- After warm-up with PWR! Exercises, gait training with rollator (vc for proximity to RW, decreasing UE support) with focus on turns and passing through doorways due to pt's tendency to freeze in these situations. Patient continues to require verbal  and tactile cues to utilize rocking or marching to initiate stepping. Patient noted to do better with tight 180 degree turns with marching, however with <=90 degree turns as aligning to a surface to sit, he did well with rocking. As practicing using these techniques as moving through doorways, also practiced his approach to and opening doors that are swinging towards him and away from him.               PWR [facility]) - 03/26/18 1042    PWR! Up  15    PWR! Rock  10    PWR! Twist  10    PWR Step  10    Comments  wiht sit to stand between each rep for Up, Rock, Twist, not step to practice consecutive stepping          PT Education - 03/26/18 1438    Education Details  discussed his technique to work through freezing episodes (he admits he gets worried that he is holding someone up, but reports he starts counting to himself to help calm and get a rhythm started); again encouraged rocking or marching to go along with his counting to "get started"     Person(s) Educated  Patient    Methods  Explanation;Demonstration;Verbal cues    Comprehension  Verbalized understanding;Returned demonstration;Verbal cues required;Need  further instruction       PT Short Term Goals - 02/28/18 2005      PT SHORT TERM GOAL #1   Title  Pt will be independent with HEP for improved balance, transfers and gait.  Updated TARGET 03/01/18    Time  4    Period  Weeks    Status  Achieved      PT SHORT TERM GOAL #2   Title  Pt will improve TUG score to less than or equal to 50 seconds for decreased fall risk.    Baseline  02/28/18 34.5 sec    Time  4    Period  Weeks    Status  Achieved      PT SHORT TERM GOAL #3   Title  Pt wil perform at least 8 of 10 reps of sit<>stand transfers from 18 inch surfaces, with no posterior lean, independently for improved transfer efficiency and safety.    Baseline  8/8 9 of 10    Time  4    Period  Weeks    Status  Achieved      PT SHORT TERM GOAL #4   Title  Pt will  verbalize undersatnding of techniques to reduce freezing episodes with gait and turns    Baseline  02/28/18 pt able to verbalize (continues to need vc at times to implement)    Time  4    Period  Weeks    Status  Achieved        PT Long Term Goals - 02/05/18 2122      PT LONG TERM GOAL #1   Title  Pt will verbalize understanding of fall prevention within the home environment.  TARGET 03/29/18    Time  8    Period  Weeks    Status  On-going    Target Date  03/29/18      PT LONG TERM GOAL #2   Title  Pt will improve TUG score to less than or equal to 35 seconds for decreased fall risk.    Time  8    Period  Weeks    Status  On-going    Target Date  03/29/18      PT LONG TERM GOAL #3   Title  Pt will improve gait velocity with conversation to at least 2.62 ft/sec for improved gait efficiency and safety.    Time  8    Period  Weeks    Status  On-going    Target Date  03/29/18      PT LONG TERM GOAL #4   Title  Pt will verbalize plans for continued community fitness upon d/c from PT.    Time  8    Status  On-going    Target Date  03/29/18            Plan - 03/26/18 1441    Clinical Impression Statement  Session focused on use of standing PWR! moves (with sit to stand incorporated) for improved ROM, balance and posture. Pre-gait and gait training to address freezing (especially as approaches doorway or a closed door he has to open to pass through). Patient can verbalize technique he is attempting to use, however does not independently begin wt-shifting/rocking on his own (requires verbal +/- tactile cues to initiate). Patient can conitnue to benefit from PT to address safety with walking.     Rehab Potential  Good    Clinical Impairments Affecting Rehab Potential  for PT goals/tx plan  PT Frequency  2x / week    PT Duration  8 weeks   plus eval   PT Treatment/Interventions  ADLs/Self Care Home Management;DME Instruction;Gait training;Functional mobility  training;Therapeutic activities;Therapeutic exercise;Balance training;Patient/family education;Neuromuscular re-education    PT Next Visit Plan  Add to HEP:  sit<>stand with PWR! Moves; work on Merchandiser, retail, gait with conversation tasks; ? try U-step walker    Consulted and Agree with Plan of Care  Patient       Patient will benefit from skilled therapeutic intervention in order to improve the following deficits and impairments:  Abnormal gait, Decreased balance, Decreased mobility, Difficulty walking, Decreased safety awareness, Decreased strength, Postural dysfunction  Visit Diagnosis: Other symptoms and signs involving the nervous system  Other abnormalities of gait and mobility     Problem List Patient Active Problem List   Diagnosis Date Noted  . Strain of right biceps 09/12/2017  . Lymphedema 02/10/2017  . BPH associated with nocturia 05/16/2016  . Senile purpura ([place]) 05/03/2016  . OSA (obstructive sleep apnea) 12/28/2015  . Hypersomnia 11/15/2015  . Cough 11/15/2015  . 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 Dizziness 12/30/2014  . Diastolic dysfunction 13/02/6577  . Former smoker 04/29/2014  . Chronic venous insufficiency 02/20/2014  . Hyperglycemia 08/02/2012  . Obesity 07/13/2010  . History of malignant melanoma. left leg 03/09/2009  . Insomnia 10/03/2007  . Hyperlipidemia 12/13/2006  . Essential hypertension 12/13/2006    Rexanne Mano, PT 03/26/2018, 2:45 PM  [facility] [address] [place], [place], [address] Phone: [phone]   Fax:  [phone]  Name: Nicholas [name] MRN: [id number] Date of Birth: [birth year]

## 2018-03-26 NOTE — Therapy (Signed)
[facility] [address] [place], [place], [address] Phone: [phone]   Fax:  [phone]  Occupational Therapy Treatment  Patient Details  Name: Nicholas [name] MRN: [id number] Date of Birth: 26-Nov-1945 Referring Provider: [name]   Encounter Date: 03/26/2018  OT End of Session - 03/26/18 1105    Visit Number  12    Number of Visits  17    Date for OT Re-Evaluation  04/08/18   end date extended due to late start   Authorization Type  [employer] (Medicare)    Authorization Time Period  cert.  01/08/18-04/08/18     Authorization - Visit Number  12    Authorization - Number of Visits  20    OT Start Time  1104    OT Stop Time  1150    OT Time Calculation (min)  46 min    Activity Tolerance  Patient tolerated treatment well    Behavior During Therapy  WFL for tasks assessed/performed       Past Medical History:  Diagnosis Date  . Cancer [facility]) Jan 2008   skin; hx of melanoma left foot/ amputation of toes 1&2   . Hyperlipidemia   . Hypertension   . Lymphedema     Past Surgical History:  Procedure Laterality Date  . 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 removal of melanoma of left foot/amputation of toes 1&25 Jul 2006  . WRIST FRACTURE SURGERY     72 years old-set    There were no vitals filed for this visit.  Subjective Assessment - 03/26/18 1105    Pertinent History  hx of PD/Parkinson's Plus Syndrone; malignant melanoma, hyperlipidemia, HTN, insomnia, obesity, hyperglycemia, dizziness, OSA, urinary urgency, lymphedema    Patient Stated Goals  improve handwriting    Currently in Pain?  No/denies       Functional mobility:  Ambulating with 4 wheeled RW to retrieve compression hose aide from car.  Pt needed min-mod A/cueing (tactile, verbal) for freezing and balance strategies with ambulation, particularly going down ramp in parking lot, with distractions, and with surface transitions, with  doorways.     Donning compression hose on RLE using stocking aide with mod A for technique/removing stocking aide due to significant lymphedema.  Pt able to put hose on aide, stand with cueing to put toe in place, but needed assist to pull up.  Discussed that pt may demo improvement with nylon sock and rubber gloves that he uses at home (but didn't bring today).  Pt to continue to practice at home.  Min-mod A to don shoe.                     OT Short Term Goals - 03/05/18 1116      OT SHORT TERM GOAL #1   Title  Pt will be independent with updated HEP.--check STGs 02/07/18    Status  Achieved      OT SHORT TERM GOAL #2   Title  Pt will be able to write short paragraph and sign name with 100% legibility and only minimal decr in size use AE/strategies.    Status  Achieved   02/28/18     OT SHORT TERM GOAL #3   Title  Pt will improve coordination for ADLs as shown by fastening/unfastening 3 buttons in 43sec or less.    Status  Achieved   02/28/18:  unable to test due to bandaid/cut on L  thumb.  03/05/18:  34.12     OT SHORT TERM GOAL #4   Title  Pt will demonstrate improved dynamic standing balance as eveidenced by performing 10 inches or greater on standing functional reach.    Status  Achieved   02/28/18:  10" bilaterally       OT Long Term Goals - 03/15/18 1140      OT LONG TERM GOAL #1   Title  Pt will verbalize understanding of AE/strategies to incr safety, ease, and independence with ADLs/IADLs.-    Status  On-going      OT LONG TERM GOAL #2   Title  Pt will improve coordination for ADLs as shown by improving time on 9-hole peg test by at least 3sec bilaterally.    Status  On-going      OT LONG TERM GOAL #3   Title  Pt will improve coordination for ADLs as shown by fastening/unfastening 3 buttons in 40sec or less.    Status  Achieved      OT LONG TERM GOAL #4   Title  Pt will improve ability to dress as shown by improving time on PPT#4 by at least 3 sec and  incorporate trunk rotation to decr risk for shoulder injury.    Status  Achieved   13.25     OT LONG TERM GOAL #5   Title  Pt will improve bradykinesia/hypokinesia for functional reaching/coordination for ADLs as shown by improving score on box and blocks test by at least 3 with LUE.    Status  On-going            Plan - 03/26/18 1106    Clinical Impression Statement  Pt demo improved ability to don TED hose today, but still needed mod A due to edema and pt did not bring nylon foot slide and rubber gloves that he typically uses.  Pt needed min-mod A/cueing today for freezing/functional mobility.    Occupational performance deficits (Please refer to evaluation for details):  ADL's;IADL's;Leisure;Work;Rest and Sleep;Social Participation    Rehab Potential  Good    OT Frequency  2x / week    OT Duration  8 weeks    OT Treatment/Interventions  Self-care/ADL training;Therapeutic exercise;Visual/perceptual remediation/compensation;Patient/family education;Neuromuscular education;Moist Heat;Fluidtherapy;Energy conservation;Therapist, nutritional;Therapeutic activities;Balance training;Cognitive remediation/compensation;Passive range of motion;Manual Therapy;DME and/or AE instruction;Ultrasound;Contrast Bath;Cryotherapy    Plan  check goals, coordination, functional mobility, functional reaching, practice with TED hose donner if pt brings in again    Consulted and Agree with Plan of Care  Patient       Patient will benefit from skilled therapeutic intervention in order to improve the following deficits and impairments:  Decreased cognition, Decreased knowledge of use of DME, Impaired vision/preception, Decreased mobility, Decreased coordination, Decreased activity tolerance, Decreased range of motion, Impaired tone, Impaired UE functional use, Impaired perceived functional ability, Decreased safety awareness, Decreased knowledge of precautions, Decreased balance  Visit Diagnosis: Other  symptoms and signs involving the nervous system  Unsteadiness on feet  Other abnormalities of gait and mobility  Other lack of coordination  Other symptoms and signs involving the musculoskeletal system    Problem List Patient Active Problem List   Diagnosis Date Noted  . Strain of right biceps 09/12/2017  . Lymphedema 02/10/2017  . BPH associated with nocturia 05/16/2016  . Senile purpura ([place]) 05/03/2016  . OSA (obstructive sleep apnea) 12/28/2015  . Hypersomnia 11/15/2015  . Cough 11/15/2015  . 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 Dizziness 12/30/2014  . Diastolic  dysfunction 10/22/2014  . Former smoker 04/29/2014  . Chronic venous insufficiency 02/20/2014  . Hyperglycemia 08/02/2012  . Obesity 07/13/2010  . History of malignant melanoma. left leg 03/09/2009  . Insomnia 10/03/2007  . Hyperlipidemia 12/13/2006  . Essential hypertension 12/13/2006    [facility] 03/26/2018, 2:04 PM  [facility] [address] [place], [place], [address] Phone: [phone]   Fax:  [phone]  Name: Nicholas [name] MRN: [id number] Date of Birth: [birth year]   Vianne Bulls, OTR/L [name] [address] [place], [place]  [address] [phone] phone [phone] 03/26/18 2:04 PM

## 2018-03-26 NOTE — Patient Instructions (Signed)
  Please complete the assigned speech therapy homework prior to your next session and return it to the speech therapist at your next visit.  

## 2018-03-26 NOTE — Therapy (Signed)
[facility] [address] [place], [place], [address] Phone: [phone]   Fax:  [phone]  Speech Language Pathology Treatment  Patient Details  Name: Nicholas [name] MRN: [id number] Date of Birth: Aug 25, 1945 Referring Provider: Dr. Wells Guiles [name]    Encounter Date: 03/26/2018  End of Session - 03/26/18 1013    Visit Number  11    Number of Visits  17    Date for SLP Re-Evaluation  04/19/18    SLP Start Time  0850    SLP Stop Time   0930    SLP Time Calculation (min)  40 min    Activity Tolerance  Patient tolerated treatment well       Past Medical History:  Diagnosis Date  . 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 Hyperlipidemia   . Hypertension   . Lymphedema     Past Surgical History:  Procedure Laterality Date  . 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 removal of melanoma of left foot/amputation of toes 1&25 Jul 2006  . WRIST FRACTURE SURGERY     72 years old-set    There were no vitals filed for this visit.  Subjective Assessment - 03/26/18 0933    Subjective  "Having a bad morning because I didn't do anything but sit on my butt for 2 days."    Currently in Pain?  No/denies            ADULT SLP TREATMENT - 03/26/18 0934      General Information   Behavior/Cognition  Alert;Cooperative;Pleasant mood      Treatment Provided   Treatment provided  Cognitive-Linquistic      Cognitive-Linquistic Treatment   Treatment focused on  Dysarthria    Skilled Treatment  Pt with great difficulty in mobility into ST room today. Conversation re: football was in upper 60s average. Loud /a/ was used to assist in recalibration of p'ts conversational loudness, with average low to mid 90s dB. In sentence responses pt did not appear to have anomic events. With occasional min-mod A for loudness pt responded with average in low 70s dB.  SLP told pt that consistent practice  would help pt with habitualizing louder speech production. "It's hard to think and talk at the same time," pt stated.       Assessment / Recommendations / Plan   Plan  Continue with current plan of care      Progression Toward Goals   Progression toward goals  Progressing toward goals         SLP Short Term Goals - 03/12/18 1340      SLP SHORT TERM GOAL #1   Title  pt will sustain loud /a/ at >90 dB at 30 cm over 3 consecutive sessions    Status  Achieved      SLP SHORT TERM GOAL #2   Title  pt will exhibit volume of 70dB in sentence responses 95% over 3 sessions    Status  Achieved      SLP SHORT TERM GOAL #3   Title  In 7 minutes simple conversation pt will achieve average speech volume of low 70s dB over two sessions    Status  Not Met      SLP SHORT TERM GOAL #4   Title  Pt will participate in objective assessment of swallow function (MBS or FEES) if warranted.    Status  Achieved      SLP SHORT TERM GOAL #5   Title  Pt will complete simple-mod complex naming tasks with 90% accuracy and rare min A     Status  Partially Met       SLP Long Term Goals - 03/26/18 1017      SLP LONG TERM GOAL #1   Title  pt will sustain loud /a/ at average >90dB at 30 cm over 5 consecutive sessions    Status  Achieved      SLP LONG TERM GOAL #2   Title  pt will generate speech in 10 minutes simple to mod complex conversation with average >70dB over three sessions    Time  3    Period  Weeks    Status  Revised      SLP LONG TERM GOAL #3   Title  Pt will tell SLP 3 s/sx of aspiration PNA.    Time  3    Period  Weeks    Status  On-going      SLP LONG TERM GOAL #4   Title  Pt will utilize compensations for anomia in structured tasks with rare min A over 4 sessions.     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 In structured 1-2 sentence tasks, he required occasional SLP min-mod A to obtain WNL loundess. In conversation after loud /a/ pt's loudness was near WNL. Continue skilled ST to maximize intelligibility for improved QOL and to reduce pt and spouse frustration.     Speech Therapy Frequency  2x / week    Treatment/Interventions  Aspiration precaution training;Diet toleration management by SLP;Trials of upgraded texture/liquids;Cognitive reorganization;Multimodal communcation approach;Compensatory strategies;Pharyngeal strengthening exercises;Language facilitation;Compensatory techniques;Cueing hierarchy;Internal/external aids;Functional tasks;SLP instruction and feedback;Patient/family education;Other (comment)    Potential to Achieve Goals  Good       Patient will benefit from skilled therapeutic intervention in order to improve the following deficits and impairments:   Dysarthria and anarthria  Cognitive communication deficit  Dysphagia, oropharyngeal phase    Problem List Patient Active Problem List   Diagnosis Date Noted  . Strain of right biceps 09/12/2017  . Lymphedema 02/10/2017  . BPH associated with nocturia 05/16/2016  . Senile purpura ([place]) 05/03/2016  . OSA (obstructive sleep apnea) 12/28/2015  . Hypersomnia 11/15/2015  . Cough 11/15/2015  . 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 Dizziness 12/30/2014  . Diastolic dysfunction 05/13/1172  . Former smoker 04/29/2014  . Chronic venous insufficiency 02/20/2014  . Hyperglycemia 08/02/2012  . Obesity 07/13/2010  . History of malignant melanoma. left leg 03/09/2009  . Insomnia 10/03/2007  . Hyperlipidemia 12/13/2006  . Essential hypertension 12/13/2006    [facility] ,[place], [place]  03/26/2018, 10:18 AM  [facility] [address] [place], [place], [address] Phone: [phone]   Fax:  [phone]   Name: Nicholas [name] MRN: [id number] Date of Birth: [birth year]

## 2018-03-28 ENCOUNTER — Encounter: Payer: PPO | Admitting: Occupational Therapy

## 2018-03-28 ENCOUNTER — Ambulatory Visit: Payer: PPO | Admitting: Physical Therapy

## 2018-04-02 ENCOUNTER — Ambulatory Visit: Payer: PPO | Admitting: Occupational Therapy

## 2018-04-02 ENCOUNTER — Encounter: Payer: Self-pay | Admitting: Physical Therapy

## 2018-04-02 ENCOUNTER — Ambulatory Visit: Payer: PPO | Admitting: Speech Pathology

## 2018-04-02 ENCOUNTER — Ambulatory Visit: Payer: PPO | Admitting: Physical Therapy

## 2018-04-02 DIAGNOSIS — R2681 Unsteadiness on feet: Secondary | ICD-10-CM

## 2018-04-02 DIAGNOSIS — R29818 Other symptoms and signs involving the nervous system: Secondary | ICD-10-CM | POA: Diagnosis not present

## 2018-04-02 DIAGNOSIS — R471 Dysarthria and anarthria: Secondary | ICD-10-CM

## 2018-04-02 DIAGNOSIS — R2689 Other abnormalities of gait and mobility: Secondary | ICD-10-CM

## 2018-04-02 NOTE — Therapy (Signed)
[facility] [address] [place], [place], [address] Phone: [phone]   Fax:  [phone]  Occupational Therapy Treatment  Patient Details  Name: Nicholas [name] MRN: [id number] Date of Birth: [birth year] Referring Provider: [name]   Encounter Date: 04/02/2018  OT End of Session - 04/02/18 1253    Visit Number  13    Number of Visits  17    Date for OT Re-Evaluation  04/08/18    Authorization Type  [employer] (Medicare)    Authorization Time Period  cert.  01/08/18-04/08/18     Authorization - Visit Number  13    Authorization - Number of Visits  20    OT Start Time  1102    OT Stop Time  1145    OT Time Calculation (min)  43 min    Activity Tolerance  Patient tolerated treatment well    Behavior During Therapy  WFL for tasks assessed/performed       Past Medical History:  Diagnosis Date  . Cancer [facility]) Jan 2008   skin; hx of melanoma left foot/ amputation of toes 1&2   . Hyperlipidemia   . Hypertension   . Lymphedema     Past Surgical History:  Procedure Laterality Date  . 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 removal of melanoma of left foot/amputation of toes 1&25 Jul 2006  . WRIST FRACTURE SURGERY     72 years old-set    There were no vitals filed for this visit.  Subjective Assessment - 04/02/18 1300    Patient Stated Goals  improve handwriting    Currently in Pain?  No/denies             Treatment: Pt brought in his sock donner, pt practiced using with therapist, he required mod A, mod v.c for use and will require assistance at home. Therapist started checking progress towards goals. Reviewed PWR! Hands basic 4               OT Short Term Goals - 03/05/18 1116      OT SHORT TERM GOAL #1   Title  Pt will be independent with updated HEP.--check STGs 02/07/18    Status  Achieved      OT SHORT TERM GOAL #2   Title  Pt will be able to write short paragraph and  sign name with 100% legibility and only minimal decr in size use AE/strategies.    Status  Achieved   02/28/18     OT SHORT TERM GOAL #3   Title  Pt will improve coordination for ADLs as shown by fastening/unfastening 3 buttons in 43sec or less.    Status  Achieved   02/28/18:  unable to test due to bandaid/cut on L thumb.  03/05/18:  34.12     OT SHORT TERM GOAL #4   Title  Pt will demonstrate improved dynamic standing balance as eveidenced by performing 10 inches or greater on standing functional reach.    Status  Achieved   02/28/18:  10" bilaterally       OT Long Term Goals - 04/02/18 1050      OT LONG TERM GOAL #1   Title  Pt will verbalize understanding of AE/strategies to incr safety, ease, and independence with ADLs/IADLs.-    Status  Achieved      OT LONG TERM GOAL #2   Title  Pt will improve coordination for ADLs as shown by  improving time on 9-hole peg test by at least 3sec bilaterally.    Status  Partially Met   RUE 27.19 secs, LUE 31.66 secs met for RUE     OT LONG TERM GOAL #3   Title  Pt will improve coordination for ADLs as shown by fastening/unfastening 3 buttons in 40sec or less.    Status  Achieved      OT LONG TERM GOAL #4   Title  Pt will improve ability to dress as shown by improving time on PPT#4 by at least 3 sec and incorporate trunk rotation to decr risk for shoulder injury.    Status  Achieved   13.25     OT LONG TERM GOAL #5   Title  Pt will improve bradykinesia/hypokinesia for functional reaching/coordination for ADLs as shown by improving score on box and blocks test by at least 3 with LUE.    Status  On-going            Plan - 04/02/18 1100    Clinical Impression Statement  Pt brought in his TED hose donner. He continues to require mod A due to edema. Pt will require continued assistance at home.    Occupational performance deficits (Please refer to evaluation for details):  ADL's;IADL's;Leisure;Work;Rest and Sleep;Social Participation     Rehab Potential  Good    OT Frequency  2x / week    OT Duration  8 weeks    OT Treatment/Interventions  Self-care/ADL training;Therapeutic exercise;Visual/perceptual remediation/compensation;Patient/family education;Neuromuscular education;Moist Heat;Fluidtherapy;Energy conservation;Therapist, nutritional;Therapeutic activities;Balance training;Cognitive remediation/compensation;Passive range of motion;Manual Therapy;DME and/or AE instruction;Ultrasound;Contrast Bath;Cryotherapy    Plan  check goals, coordination, functional mobility, d/c    Consulted and Agree with Plan of Care  Patient       Patient will benefit from skilled therapeutic intervention in order to improve the following deficits and impairments:  Decreased cognition, Decreased knowledge of use of DME, Impaired vision/preception, Decreased mobility, Decreased coordination, Decreased activity tolerance, Decreased range of motion, Impaired tone, Impaired UE functional use, Impaired perceived functional ability, Decreased safety awareness, Decreased knowledge of precautions, Decreased balance  Visit Diagnosis: Other symptoms and signs involving the nervous system    Problem List Patient Active Problem List   Diagnosis Date Noted  . Strain of right biceps 09/12/2017  . Lymphedema 02/10/2017  . BPH associated with nocturia 05/16/2016  . Senile purpura ([place]) 05/03/2016  . OSA (obstructive sleep apnea) 12/28/2015  . Hypersomnia 11/15/2015  . Cough 11/15/2015  . 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 Dizziness 12/30/2014  . Diastolic dysfunction 17/71/1657  . Former smoker 04/29/2014  . Chronic venous insufficiency 02/20/2014  . Hyperglycemia 08/02/2012  . Obesity 07/13/2010  . History of malignant melanoma. left leg 03/09/2009  . Insomnia 10/03/2007  . Hyperlipidemia 12/13/2006  . Essential hypertension 12/13/2006    Cleotis Sparr 04/02/2018, 1:00 PM Theone Murdoch, OTR/L [phone] Phone: [phone] 1:02 PM 04/02/18 [facility] [address] [place], [place], [address] Phone: [phone]   Fax:  [phone]  Name: Nicholas [name] MRN: [id number] Date of Birth: 29-Dec-1945

## 2018-04-02 NOTE — Therapy (Signed)
[facility] [address] [place], [place], [address] Phone: [phone]   Fax:  [phone]  Speech Language Pathology Treatment  Patient Details  Name: Nicholas [name] MRN: [id number] Date of Birth: 1945/10/10 Referring Provider: [name]    Encounter Date: 04/02/2018  End of Session - 04/02/18 1253    Visit Number  12    Number of Visits  17    Date for SLP Re-Evaluation  04/19/18    SLP Start Time  1150    SLP Stop Time   1230    SLP Time Calculation (min)  40 min    Activity Tolerance  Patient tolerated treatment well       Past Medical History:  Diagnosis Date  . 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 Hyperlipidemia   . Hypertension   . Lymphedema     Past Surgical History:  Procedure Laterality Date  . 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 removal of melanoma of left foot/amputation of toes 1&25 Jul 2006  . WRIST FRACTURE SURGERY     72 years old-set    There were no vitals filed for this visit.  Subjective Assessment - 04/02/18 1156    Subjective  "I had maybe one episode since then." (re swallow test)    Currently in Pain?  No/denies            ADULT SLP TREATMENT - 04/02/18 1150      General Information   Behavior/Cognition  Alert;Cooperative;Pleasant mood      Treatment Provided   Treatment provided  Cognitive-Linquistic      Cognitive-Linquistic Treatment   Treatment focused on  Dysarthria    Skilled Treatment  Pt again had great difficulty with mobility on his way to and in [place] room today. Conversation was initially in mid-upper 60s dB. Loud /a/ was used to assist in recalibration of p'ts conversational loudness, with average low to mid 90s dB. Pt had difficulty in structured naming tasks (dual meanings) with negative impact on vocal intensity. Occasional min-mod question cues required, and loudness improved with second attempts.  Frequent verbal cues required to maintain simple conversation in upper 60s to low 70s. Pt attempts correction with non-verbal cue, but second attempts did not result in increased intensity without verbal cuing.       Assessment / Recommendations / Plan   Plan  Continue with current plan of care      Progression Toward Goals   Progression toward goals  Progressing toward goals         SLP Short Term Goals - 04/02/18 1154      SLP SHORT TERM GOAL #1   Title  pt will sustain loud /a/ at >90 dB at 30 cm over 3 consecutive sessions    Status  Achieved      SLP SHORT TERM GOAL #2   Title  pt will exhibit volume of 70dB in sentence responses 95% over 3 sessions    Status  Achieved      SLP SHORT TERM GOAL #3   Title  In 7 minutes simple conversation pt will achieve average speech volume of low 70s dB over two sessions    Status  Not Met      SLP SHORT TERM GOAL #4   Title  Pt will participate in objective assessment of swallow function (MBS or FEES) if warranted.  Status  Achieved      SLP SHORT TERM GOAL #5   Title  Pt will complete simple-mod complex naming tasks with 90% accuracy and rare min A     Status  Partially Met       SLP Long Term Goals - 04/02/18 1154      SLP LONG TERM GOAL #1   Title  pt will sustain loud /a/ at average >90dB at 30 cm over 5 consecutive sessions    Status  Achieved      SLP LONG TERM GOAL #2   Title  pt will generate speech in 10 minutes simple to mod complex conversation with average >70dB over three sessions    Time  2    Period  Weeks    Status  On-going      SLP LONG TERM GOAL #3   Title  Pt will tell SLP 3 s/sx of aspiration PNA.    Time  2    Period  Weeks    Status  On-going      SLP LONG TERM GOAL #4   Title  Pt will utilize compensations for anomia in structured tasks with rare min A over 4 sessions.     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 In structured 1-2 sentence tasks, he required occasional SLP min-mod A to obtain WNL loundess. Frequent verbal cues required to maintain loudness in conversation in upper 60s-low 70s today. Continue skilled ST to maximize intelligibility for improved QOL and to reduce pt and spouse frustration.     Speech Therapy Frequency  2x / week    Duration  Other (comment)   8 weeks or 17 visits   Treatment/Interventions  Aspiration precaution training;Diet toleration management by SLP;Trials of upgraded texture/liquids;Cognitive reorganization;Multimodal communcation approach;Compensatory strategies;Pharyngeal strengthening exercises;Language facilitation;Compensatory techniques;Cueing hierarchy;Internal/external aids;Functional tasks;SLP instruction and feedback;Patient/family education;Other (comment)    Potential to Achieve Goals  Good       Patient will benefit from skilled therapeutic intervention in order to improve the following deficits and impairments:   Dysarthria and anarthria    Problem List Patient Active Problem List   Diagnosis Date Noted  . Strain of right biceps 09/12/2017  . Lymphedema 02/10/2017  . BPH associated with nocturia 05/16/2016  . Senile purpura ([place]) 05/03/2016  . OSA (obstructive sleep apnea) 12/28/2015  . Hypersomnia 11/15/2015  . Cough 11/15/2015  . 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 Dizziness 12/30/2014  . Diastolic dysfunction 01/21/1600  . Former smoker 04/29/2014  . Chronic venous insufficiency 02/20/2014  . Hyperglycemia 08/02/2012  . Obesity 07/13/2010  . History of malignant melanoma. left leg 03/09/2009  . Insomnia 10/03/2007  . Hyperlipidemia 12/13/2006  . Essential hypertension 12/13/2006   Deneise Lever,  [place] [place] Speech-Language Pathologist  Aliene Altes 04/02/2018, 12:55 PM  [facility] [address] [place], [place], [address] Phone: [phone]   Fax:  [phone]   Name: Nicholas [name] MRN: [id number] Date of Birth: 04-May-1946

## 2018-04-02 NOTE — Therapy (Signed)
[facility] [address] [place], [place], [address] Phone: [phone]   Fax:  [phone]  Physical Therapy Treatment  Patient Details  Name: Nicholas [name] MRN: [id number] Date of Birth: 04/19/1946 Referring Provider: Dr. Rebecca [name]   Encounter Date: 04/02/2018  PT End of Session - 04/02/18 2149    Visit Number  14    Number of Visits  17    Date for PT Re-Evaluation  04/08/18    Authorization Type  [employer] (Medicare)-will need 10th visit progress note    Authorization Time Period  cert 01/08/18-04/08/18    PT Start Time  1102    PT Stop Time  1147    PT Time Calculation (min)  45 min    Activity Tolerance  Patient tolerated treatment well    Behavior During Therapy  WFL for tasks assessed/performed       Past Medical History:  Diagnosis Date  . 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 Hyperlipidemia   . Hypertension   . Lymphedema     Past Surgical History:  Procedure Laterality Date  . 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 removal of melanoma of left foot/amputation of toes 1&25 Jul 2006  . WRIST FRACTURE SURGERY     72 years old-set    There were no vitals filed for this visit.  Subjective Assessment - 04/02/18 1105    Subjective  No falls, I just go slow.  Know that I need to continue my PT exercises, even at home.    Pertinent History  Lymphedema-has compression machine and uses stockings (wife helps); hx of PD/Parkinson's Plus Syndrone; malignant melanoma, hyperlipidemia, HTN, insomnia, obesity, hyperglycemia, dizziness, OSA, urinary urgency    Patient Stated Goals  Pt's goals for therapy are to "be like I used to be."    Currently in Pain?  No/denies                       [facility] Adult PT Treatment/Exercise - 04/02/18 0001      Transfers   Transfers  Sit to Stand;Stand to Sit    Sit to Stand  6: Modified independent  (Device/Increase time);Without upper extremity assist;From bed    Stand to Sit  6: Modified independent (Device/Increase time);Without upper extremity assist;To bed      Ambulation/Gait   Ambulation/Gait  Yes    Ambulation/Gait Assistance  5: Supervision    Ambulation/Gait Assistance Details  Pt has difficulty with approach to destination today, with increased freezing episodes.    Ambulation Distance (Feet)  240 Feet    Assistive device  Rollator    Gait Pattern  Step-through pattern;Decreased step length - right;Decreased step length - left;Decreased dorsiflexion - right;Decreased dorsiflexion - left;Poor foot clearance - left;Poor foot clearance - right;Festinating    Ambulation Surface  Level;Indoor    Gait velocity  16.69 sec with rollator:  1.97 ft/sec; with conversation:  19.12 at best (1.72 ft/sec   1st trial with conversation:  22.59 sec = 1.45 ft/sec   Pre-Gait Activities  Pt with slowed gait with conversation, increased festinating with conversations and turns-discussed avoiding dual tasking/conversation tasks with gait, to allow for increased focus on gait and strategies to reduce festination with gait.      Standardized Balance Assessment   Standardized Balance Assessment  Timed Up and Go Test      Timed   Up and Go Test   TUG  Normal TUG    Normal TUG (seconds)  76.97   58.84     Self-Care   Self-Care  Other Self-Care Comments    Other Self-Care Comments   Discussed optimal fitness options upon d/c from PT-including [employer] spin class , continueing HEP at home.  Discussed option of aquatic therapy as a possibility;PT aware of pt's interest, and pt aware that PT will need to speak to therapist who provides aquatic PT as well as need for addition to [facility] for aquatic therapy.          P[facility]) - 04/02/18 2135    PWR! exercises  Moves in standing    PWR! Up  x 10    PWR! Rock  x 5    PWR! Twist  x 5    PWR Step  x 5    Comments  With sit to stand each rep for PWR! Up,  [employer], Step          PT Education - 04/02/18 2147    Education Details  Avoiding dual tasking/conversation tasks with gait for improved focus on strategies to work through freezing episodes; POC, including upcoming discharge (versus updated POC to include aquatic therapy)    Person(s) Educated  Patient    Methods  Explanation    Comprehension  Verbalized understanding       PT Short Term Goals - 02/28/18 2005      PT SHORT TERM GOAL #1   Title  Pt will be independent with HEP for improved balance, transfers and gait.  Updated TARGET 03/01/18    Time  4    Period  Weeks    Status  Achieved      PT SHORT TERM GOAL #2   Title  Pt will improve TUG score to less than or equal to 50 seconds for decreased fall risk.    Baseline  02/28/18 34.5 sec    Time  4    Period  Weeks    Status  Achieved      PT SHORT TERM GOAL #3   Title  Pt wil perform at least 8 of 10 reps of sit<>stand transfers from 18 inch surfaces, with no posterior lean, independently for improved transfer efficiency and safety.    Baseline  8/8 9 of 10    Time  4    Period  Weeks    Status  Achieved      PT SHORT TERM GOAL #4   Title  Pt will verbalize undersatnding of techniques to reduce freezing episodes with gait and turns    Baseline  02/28/18 pt able to verbalize (continues to need vc at times to implement)    Time  4    Period  Weeks    Status  Achieved        PT Long Term Goals - 04/02/18 2150      PT LONG TERM GOAL #1   Title  Pt will verbalize understanding of fall prevention within the home environment.  TARGET 03/29/18    Time  8    Period  Weeks    Status  On-going      PT LONG TERM GOAL #2   Title  Pt will improve TUG score to less than or equal to 35 seconds for decreased fall risk.    Time  8    Period  Weeks    Status  Not Met  PT LONG TERM GOAL #3   Title  Pt will improve gait velocity with conversation to at least 2.62 ft/sec for improved gait efficiency and safety.    Time  8     Period  Weeks    Status  Not Met      PT LONG TERM GOAL #4   Title  Pt will verbalize plans for continued community fitness upon d/c from PT.    Time  8    Status  On-going            Plan - 04/02/18 2150    Clinical Impression Statement  Skilled PT focused on checking LTGs, with pt not meeting LTG for TUG score or gait velocity.  Pt has significant difficulty with turning to sit, which slows score for TUG.  Discussed possibility of trying U-step RW;however, pt is not interested.  In discussion of continued fitness following discharge from PT, pt expresses interest in pursuing aquatic therapy.  Will discuss in more detail and check remaining goals next visit.    Rehab Potential  Good    Clinical Impairments Affecting Rehab Potential  for PT goals/tx plan    PT Frequency  2x / week    PT Duration  8 weeks   plus eval   PT Treatment/Interventions  ADLs/Self Care Home Management;DME Instruction;Gait training;Functional mobility training;Therapeutic activities;Therapeutic exercise;Balance training;Patient/family education;Neuromuscular re-education    PT Next Visit Plan  Add to HEP:  sit<>stand with PWR! Moves; work on [employer], retail, check remaining LTGs and discuss d/c versus updated POC to include aquatic therapy    Consulted and Agree with Plan of Care  Patient       Patient will benefit from skilled therapeutic intervention in order to improve the following deficits and impairments:  Abnormal gait, Decreased balance, Decreased mobility, Difficulty walking, Decreased safety awareness, Decreased strength, Postural dysfunction  Visit Diagnosis: Other abnormalities of gait and mobility  Unsteadiness on feet  Other symptoms and signs involving the nervous system     Problem List Patient Active Problem List   Diagnosis Date Noted  . Strain of right biceps 09/12/2017  . Lymphedema 02/10/2017  . BPH associated with nocturia 05/16/2016  . Senile purpura ([place]) 05/03/2016   . OSA (obstructive sleep apnea) 12/28/2015  . Hypersomnia 11/15/2015  . Cough 11/15/2015  . 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 Dizziness 12/30/2014  . Diastolic dysfunction 65/46/5035  . Former smoker 04/29/2014  . Chronic venous insufficiency 02/20/2014  . Hyperglycemia 08/02/2012  . Obesity 07/13/2010  . History of malignant melanoma. left leg 03/09/2009  . Insomnia 10/03/2007  . Hyperlipidemia 12/13/2006  . Essential hypertension 12/13/2006    MARRIOTT,AMY W. 04/02/2018, 9:57 PM  [name]., PT   [facility] [address] [place], [place], [address] Phone: [phone]   Fax:  [phone]  Name: Nicholas [name] MRN: [id number] Date of Birth: 03-Sep-1945

## 2018-04-03 ENCOUNTER — Ambulatory Visit: Payer: PPO | Admitting: Physical Therapy

## 2018-04-03 ENCOUNTER — Ambulatory Visit: Payer: PPO

## 2018-04-03 ENCOUNTER — Ambulatory Visit: Payer: PPO | Admitting: Occupational Therapy

## 2018-04-03 ENCOUNTER — Encounter: Payer: Self-pay | Admitting: Physical Therapy

## 2018-04-03 DIAGNOSIS — M6281 Muscle weakness (generalized): Secondary | ICD-10-CM

## 2018-04-03 DIAGNOSIS — R1312 Dysphagia, oropharyngeal phase: Secondary | ICD-10-CM

## 2018-04-03 DIAGNOSIS — R2689 Other abnormalities of gait and mobility: Secondary | ICD-10-CM

## 2018-04-03 DIAGNOSIS — R2681 Unsteadiness on feet: Secondary | ICD-10-CM

## 2018-04-03 DIAGNOSIS — R41841 Cognitive communication deficit: Secondary | ICD-10-CM

## 2018-04-03 DIAGNOSIS — R29818 Other symptoms and signs involving the nervous system: Secondary | ICD-10-CM

## 2018-04-03 DIAGNOSIS — R278 Other lack of coordination: Secondary | ICD-10-CM

## 2018-04-03 DIAGNOSIS — R471 Dysarthria and anarthria: Secondary | ICD-10-CM

## 2018-04-03 DIAGNOSIS — R29898 Other symptoms and signs involving the musculoskeletal system: Secondary | ICD-10-CM

## 2018-04-03 NOTE — Patient Instructions (Addendum)
Sit to stand (Scoot out to the edge of the chair or your bed):    Followed by each PWR! Move -PWR! UP x 5 -PWR! Rock x 5 -PWR! Twist x 5 -PWR! Step x 5   It is important to avoid accidents which may result in broken bones.  Here are a few ideas on how to make your home safer so you will be less likely to trip or fall.  1. Use nonskid mats or non slip strips in your shower or tub, on your bathroom floor and around sinks.  If you know that you have spilled water, wipe it up! 2. In the bathroom, it is important to have properly installed grab bars on the walls or on the edge of the tub.  Towel racks are NOT strong enough for you to hold onto or to pull on for support. 3. Stairs and hallways should have enough light.  Add lamps or night lights if you need more light. 4. It is good to have handrails on both sides of the stairs if possible.  Always fix broken handrails right away. 5. It is important to see the edges of steps.  Paint the edges of outdoor steps white so you can see them better.  Put colored tape on the edge of inside steps. 6. Throw-rugs are dangerous because they can slide.  Removing the rugs is the best idea, but if they must stay, add adhesive carpet tape to prevent slipping. 7. Do not keep things on stairs or in the halls.  Remove small furniture that blocks the halls as it may cause you to trip.  Keep telephone and electrical cords out of the way where you walk. 8. Always were sturdy, rubber-soled shoes for good support.  Never wear just socks, especially on the stairs.  Socks may cause you to slip or fall.  Do not wear full-length housecoats as you can easily trip on the bottom.  9. Place the things you use the most on the shelves that are the easiest to reach.  If you use a stepstool, make sure it is in good condition.  If you feel unsteady, DO NOT climb, ask for help. If a health professional advises you to use a cane or walker, do not be ashamed.  These items can keep you from  falling and breaking your bones.

## 2018-04-03 NOTE — Patient Instructions (Signed)
Signs of Aspiration Pneumonia   . Chest pain/tightness . Fever (can be low grade) . Cough  o With foul-smelling phlegm (sputum) o With sputum containing pus or blood o With greenish sputum . Fatigue  . Shortness of breath  . Wheezing   **IF YOU HAVE THESE SIGNS, CONTACT YOUR DOCTOR OR GO TO THE EMERGENCY DEPARTMENT OR URGENT CARE AS SOON AS POSSIBLE**      

## 2018-04-03 NOTE — Therapy (Signed)
[facility] [address] [place], [place], [address] Phone: [phone]   Fax:  [phone]  Speech Language Pathology Treatment  Patient Details  Name: Nicholas [name] MRN: [id number] Date of Birth: 1946/07/14 Referring Provider: [name]    Encounter Date: 04/03/2018  End of Session - 04/03/18 1425    Visit Number  13    Number of Visits  17    Date for SLP Re-Evaluation  04/19/18    SLP Start Time  1103    SLP Stop Time   1145    SLP Time Calculation (min)  42 min    Activity Tolerance  Patient tolerated treatment well       Past Medical History:  Diagnosis Date  . Cancer [facility]) Jan 2008   skin; hx of melanoma left foot/ amputation of toes 1&2   . Hyperlipidemia   . Hypertension   . Lymphedema     Past Surgical History:  Procedure Laterality Date  . 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 removal of melanoma of left foot/amputation of toes 1&25 Jul 2006  . WRIST FRACTURE SURGERY     72 years old-set    There were no vitals filed for this visit.         ADULT SLP TREATMENT - 04/03/18 1112      General Information   Behavior/Cognition  Alert;Cooperative;Pleasant mood      Treatment Provided   Treatment provided  Dysphagia;Cognitive-Linquistic      Dysphagia Treatment   Temperature Spikes Noted  No    Patient observed directly with PO's  Yes    Type of PO's observed  Thin liquids    Oral Phase Signs & Symptoms  --   none today   Pharyngeal Phase Signs & Symptoms  --   none today   Other treatment/comments  SLP provided pt with overt s/s aspiration PNA - pt told them back to SLP 10 minutes later.      Cognitive-Linquistic Treatment   Treatment focused on  Dysarthria    Skilled Treatment  With loud /a/ pt maintained average in low 90s with min A usually for loudness. Conversation afterwards had loudness unchanged from prior to loud /a/. Pt remarked that yesterday he told SLP he was  apprehensive about talking too loudly and SLP recorded his speech at a "shouting" level and when played back pt was not shouting. SLP used this to reinforce to pt that he needs to shout.  In 2 sentence responses today SLP initially used mod cues usually for loudness/effort, faded to min-mod cues occasionally for effort/loudness. SLP used digital recordings of pt's responses for auditory feedback. Eliseo Gum time pt stated the recording did not show pt was shouting.  No carryover in simple conversation between tasks except with SLP cues.       Assessment / Recommendations / Plan   Plan  Continue with current plan of care      Dysphagia Recommendations   Diet recommendations  Regular;Thin liquid      Progression Toward Goals   Progression toward goals  Progressing toward goals       SLP Education - 04/03/18 1425    Education Details  aspiration PNA signs/symptoms    Person(s) Educated  Patient    Methods  Explanation;Handout    Comprehension  Verbalized understanding;Returned demonstration       SLP Short Term Goals - 04/02/18 1154      SLP SHORT TERM  GOAL #1   Title  pt will sustain loud /a/ at >90 dB at 30 cm over 3 consecutive sessions    Status  Achieved      SLP SHORT TERM GOAL #2   Title  pt will exhibit volume of 70dB in sentence responses 95% over 3 sessions    Status  Achieved      SLP SHORT TERM GOAL #3   Title  In 7 minutes simple conversation pt will achieve average speech volume of low 70s dB over two sessions    Status  Not Met      SLP SHORT TERM GOAL #4   Title  Pt will participate in objective assessment of swallow function (MBS or FEES) if warranted.    Status  Achieved      SLP SHORT TERM GOAL #5   Title  Pt will complete simple-mod complex naming tasks with 90% accuracy and rare min A     Status  Partially Met       SLP Long Term Goals - 04/03/18 1110      SLP LONG TERM GOAL #1   Title  pt will sustain loud /a/ at average >90dB at 30 cm over 5 consecutive  sessions    Status  Achieved      SLP LONG TERM GOAL #2   Title  pt will generate speech in 10 minutes simple to mod complex conversation with average >70dB over three sessions    Time  2    Period  Weeks    Status  On-going      SLP LONG TERM GOAL #3   Title  Pt will tell SLP 3 s/sx of aspiration PNA.    Time  --    Period  --    Status  Achieved      SLP LONG TERM GOAL #4   Title  Pt will utilize compensations for anomia in structured tasks with rare min A over 4 sessions.     Time  3    Period  Weeks    Status  On-going      SLP LONG TERM GOAL #5   Title  pt will complete mod complex naming tasks with 80% success and rare min A    Time  2    Period  Weeks    Status  On-going       Plan - 04/03/18 1426    Clinical Impression Statement  Pt's hypokinetic dysarthria persists, without carryover to conversational speech today. In structured tasks, he required SLP A to obtain WNL loundess. Continue skilled ST to maximize intelligibility for improved QOL and to reduce pt and spouse frustration.     Speech Therapy Frequency  2x / week    Duration  Other (comment)   8 weeks or 17 visits   Treatment/Interventions  Aspiration precaution training;Diet toleration management by SLP;Trials of upgraded texture/liquids;Cognitive reorganization;Multimodal communcation approach;Compensatory strategies;Pharyngeal strengthening exercises;Language facilitation;Compensatory techniques;Cueing hierarchy;Internal/external aids;Functional tasks;SLP instruction and feedback;Patient/family education;Other (comment)    Potential to Achieve Goals  Good       Patient will benefit from skilled therapeutic intervention in order to improve the following deficits and impairments:   No diagnosis found.    Problem List Patient Active Problem List   Diagnosis Date Noted  . Strain of right biceps 09/12/2017  . Lymphedema 02/10/2017  . BPH associated with nocturia 05/16/2016  . Senile purpura ([place])  05/03/2016  . OSA (obstructive sleep apnea) 12/28/2015  . Hypersomnia 11/15/2015  .  Cough 11/15/2015  . 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 Dizziness 12/30/2014  . Diastolic dysfunction 34/09/5246  . Former smoker 04/29/2014  . Chronic venous insufficiency 02/20/2014  . Hyperglycemia 08/02/2012  . Obesity 07/13/2010  . History of malignant melanoma. left leg 03/09/2009  . Insomnia 10/03/2007  . Hyperlipidemia 12/13/2006  . Essential hypertension 12/13/2006    [facility] ,[place], [place]  04/03/2018, 2:28 PM  [facility] [address] [place], [place], [address] Phone: [phone]   Fax:  [phone]   Name: SIPRIANO FENDLEY MRN: [id number] Date of Birth: 07-15-1946

## 2018-04-03 NOTE — Therapy (Signed)
[facility] [address] [place], [place], [address] Phone: [phone]   Fax:  [phone]  Occupational Therapy Treatment  Patient Details  Name: Nicholas [name] MRN: [id number] Date of Birth: July 02, 1946 Referring Provider: [name]   Encounter Date: 04/03/2018    Past Medical History:  Diagnosis Date  . Cancer [facility]) Jan 2008   skin; hx of melanoma left foot/ amputation of toes 1&2   . Hyperlipidemia   . Hypertension   . Lymphedema     Past Surgical History:  Procedure Laterality Date  . 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 removal of melanoma of left foot/amputation of toes 1&25 Jul 2006  . WRIST FRACTURE SURGERY     72 years old-set    There were no vitals filed for this visit.  Subjective Assessment - 04/03/18 0940    Subjective   Pt agrees with plans for d/c    Pertinent History  hx of PD/Parkinson's Plus Syndrone; malignant melanoma, hyperlipidemia, HTN, insomnia, obesity, hyperglycemia, dizziness, OSA, urinary urgency, lymphedema    Patient Stated Goals  improve handwriting    Currently in Pain?  No/denies                Treatment: PWR! Modified quadraped over tabletop 10 reps  Each, min v.c Therapist checked progress towards goals and reviewed progress with pt. Flipping and dealing playing cards and stacking/ manipulating coins with large amplitude movements, min v.c             OT Short Term Goals - 03/05/18 1116      OT SHORT TERM GOAL #1   Title  Pt will be independent with updated HEP.--check STGs 02/07/18    Status  Achieved      OT SHORT TERM GOAL #2   Title  Pt will be able to write short paragraph and sign name with 100% legibility and only minimal decr in size use AE/strategies.    Status  Achieved   02/28/18     OT SHORT TERM GOAL #3   Title  Pt will improve coordination for ADLs as shown by fastening/unfastening 3 buttons in 43sec or less.    Status   Achieved   02/28/18:  unable to test due to bandaid/cut on L thumb.  03/05/18:  34.12     OT SHORT TERM GOAL #4   Title  Pt will demonstrate improved dynamic standing balance as eveidenced by performing 10 inches or greater on standing functional reach.    Status  Achieved   02/28/18:  10" bilaterally       OT Long Term Goals - 04/03/18 0942      OT LONG TERM GOAL #1   Title  Pt will verbalize understanding of AE/strategies to incr safety, ease, and independence with ADLs/IADLs.-    Status  Achieved      OT LONG TERM GOAL #2   Title  Pt will improve coordination for ADLs as shown by improving time on 9-hole peg test by at least 3sec bilaterally.    Status  Partially Met      OT LONG TERM GOAL #3   Title  Pt will improve coordination for ADLs as shown by fastening/unfastening 3 buttons in 40sec or less.    Status  Achieved      OT LONG TERM GOAL #4   Title  Pt will improve ability to dress as shown by improving time on PPT#4 by at least 3  sec and incorporate trunk rotation to decr risk for shoulder injury.    Status  Achieved      OT LONG TERM GOAL #5   Title  Pt will improve bradykinesia/hypokinesia for functional reaching/coordination for ADLs as shown by improving score on box and blocks test by at least 3 with LUE.    Status  Achieved   43 blocks           Plan - 04/03/18 1005    Clinical Impression Statement  Pt agrees with plans for d/c today. Pt plans to retrun for PD screens in 6 mons.    Occupational Profile and client history currently impacting functional performance  Pt is working part time and performing BADLs mostly mod I-min A (was mod I)  Pt reports incr difficulty with ADLs and decr in IADL performance over last 39month-year.      Occupational performance deficits (Please refer to evaluation for details):  ADL's;IADL's;Leisure;Work;Rest and Sleep;Social Participation    Rehab Potential  Good    OT Frequency  2x / week    OT Duration  8 weeks    OT  Treatment/Interventions  Self-care/ADL training;Therapeutic exercise;Visual/perceptual remediation/compensation;Patient/family education;Neuromuscular education;Moist Heat;Fluidtherapy;Energy conservation;FTherapist, nutritionalTherapeutic activities;Balance training;Cognitive remediation/compensation;Passive range of motion;Manual Therapy;DME and/or AE instruction;Ultrasound;Contrast Bath;Cryotherapy    Plan  d/c OT, pt is in agreement    Consulted and Agree with Plan of Care  Patient       Patient will benefit from skilled therapeutic intervention in order to improve the following deficits and impairments:  Decreased cognition, Decreased knowledge of use of DME, Impaired vision/preception, Decreased mobility, Decreased coordination, Decreased activity tolerance, Decreased range of motion, Impaired tone, Impaired UE functional use, Impaired perceived functional ability, Decreased safety awareness, Decreased knowledge of precautions, Decreased balance  Visit Diagnosis: No diagnosis found.    Problem List Patient Active Problem List   Diagnosis Date Noted  . Strain of right biceps 09/12/2017  . Lymphedema 02/10/2017  . BPH associated with nocturia 05/16/2016  . [place]ile purpura ([place] 05/03/2016  . OSA (obstructive sleep apnea) 12/28/2015  . Hypersomnia 11/15/2015  . Cough 11/15/2015  . Parkinson's plus syndrome ([place] 06/22/2015  . Dizziness 12/30/2014  . Diastolic dysfunction 070/96/2836 . Former smoker 04/29/2014  . Chronic venous insufficiency 02/20/2014  . Hyperglycemia 08/02/2012  . Obesity 07/13/2010  . History of malignant melanoma. left leg 03/09/2009  . Insomnia 10/03/2007  . Hyperlipidemia 12/13/2006  . Essential hypertension 12/13/2006  OCCUPATIONAL THERAPY DISCHARGE SUMMARY   Current functional level related to goals / functional outcomes: Pt made good overall progress. See above goals.   Remaining deficits: Decreased coordination, decreased balance, cognitive  deficits,  freezing, rigidity Education / Equipment: Pt was  Plan: Patient agrees to discharge.  Patient goals were not met. Patient is being discharged due to meeting the stated rehab goals.  ?????     Naveah Brave 04/03/2018, 10:08 AM KTheone Murdoch OTR/L [phone]: [phone] AM 04/04/18 [facility] [address]Phone: [phone]  Fax:  [phone] Name: Nicholas LUKASIKMRN: [id number] of Birth: [birth year]

## 2018-04-04 NOTE — Therapy (Signed)
[facility] [address] [place], [place], [address] Phone: [phone]   Fax:  [phone]  Physical Therapy Treatment  Patient Details  Name: Nicholas [name] MRN: [id number] Date of Birth: [birth year] Referring Provider: [name]   Encounter Date: 04/03/2018  PT End of Session - 04/04/18 1358    Visit Number  15    Number of Visits  19    Date for PT Re-Evaluation  06/02/18    Authorization Type  [employer] (Medicare)-will need 10th visit progress note    Authorization Time Period  cert 7/82/42-3/53/61    PT Start Time  1018    PT Stop Time  1056    PT Time Calculation (min)  38 min    Activity Tolerance  Patient tolerated treatment well    Behavior During Therapy  [facility] for tasks assessed/performed       Past Medical History:  Diagnosis Date  . 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 Hyperlipidemia   . Hypertension   . Lymphedema     Past Surgical History:  Procedure Laterality Date  . 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 removal of melanoma of left foot/amputation of toes 1&25 Jul 2006  . WRIST FRACTURE SURGERY     72 years old-set    There were no vitals filed for this visit.  Subjective Assessment - 04/03/18 1023    Subjective  No changes since yesterday.    Pertinent History  Lymphedema-has compression machine and uses stockings (wife helps); hx of PD/Parkinson's Plus Syndrone; malignant melanoma, hyperlipidemia, HTN, insomnia, obesity, hyperglycemia, dizziness, OSA, urinary urgency    Patient Stated Goals  Pt's goals for therapy are to "be like I used to be."    Currently in Pain?  No/denies                       [facility] Adult PT Treatment/Exercise - 04/04/18 0001      Transfers   Transfers  Sit to Stand;Stand to Sit    Sit to Stand  6: Modified independent (Device/Increase time);Without upper extremity assist;From bed    Stand  to Sit  6: Modified independent (Device/Increase time);Without upper extremity assist;To bed      Ambulation/Gait   Ambulation/Gait  Yes    Ambulation/Gait Assistance  5: Supervision    Ambulation/Gait Assistance Details  With transitional movements, turning to sit, approaching destination:  had patient verbalize what he would do in each situation.    Ambulation Distance (Feet)  200 Feet    Assistive device  Rollator    Gait Pattern  Step-through pattern;Decreased step length - right;Decreased step length - left;Decreased dorsiflexion - right;Decreased dorsiflexion - left;Poor foot clearance - left;Poor foot clearance - right;Festinating    Ambulation Surface  Level;Indoor    Door Management  5: Supervision    Door Managment Details (indicate cue type and reason)  Negotiates door to the bathroom-using rollator, with cues for big movements for pushing and clearing doorway.      Self-Care   Self-Care  Other Self-Care Comments    Other Self-Care Comments   Discussed fall prevention, discussed POC and pt in agreement to pursue aquatic therapy for additional strength, balance, endurance in pool setting.  Discussed importance of continuing HEP consistently at home.        [facility]) - 04/04/18 1346    PWR!  exercises  Moves in standing    PWR! Up  x 5 reps    PWR! Rock  x 5 reps    PWR! Twist  x 5 reps    PWR Step  x 5 reps    Comments  With sit<>stand between each rep above          PT Education - 04/04/18 1356    Education Details  Addition of sit<>stand with standing PWR! MOves for additional functional strengthening; fall prevention; POC updated to include aquatic therapy    Person(s) Educated  Patient    Methods  Explanation;Handout;Demonstration    Comprehension  Verbalized understanding;Returned demonstration       PT Short Term Goals - 02/28/18 2005      PT SHORT TERM GOAL #1   Title  Pt will be independent with HEP for improved balance, transfers and gait.  Updated  TARGET 03/01/18    Time  4    Period  Weeks    Status  Achieved      PT SHORT TERM GOAL #2   Title  Pt will improve TUG score to less than or equal to 50 seconds for decreased fall risk.    Baseline  02/28/18 34.5 sec    Time  4    Period  Weeks    Status  Achieved      PT SHORT TERM GOAL #3   Title  Pt wil perform at least 8 of 10 reps of sit<>stand transfers from 18 inch surfaces, with no posterior lean, independently for improved transfer efficiency and safety.    Baseline  8/8 9 of 10    Time  4    Period  Weeks    Status  Achieved      PT SHORT TERM GOAL #4   Title  Pt will verbalize undersatnding of techniques to reduce freezing episodes with gait and turns    Baseline  02/28/18 pt able to verbalize (continues to need vc at times to implement)    Time  4    Period  Weeks    Status  Achieved        PT Long Term Goals - 04/04/18 1450      PT LONG TERM GOAL #1   Title  Pt will verbalize understanding of fall prevention within the home environment.  TARGET 03/29/18    Time  8    Period  Weeks    Status  Achieved      PT LONG TERM GOAL #2   Title  Pt will improve TUG score to less than or equal to 35 seconds for decreased fall risk.    Time  8    Period  Weeks    Status  Not Met      PT LONG TERM GOAL #3   Title  Pt will improve gait velocity with conversation to at least 2.62 ft/sec for improved gait efficiency and safety.    Time  8    Period  Weeks    Status  Not Met      PT LONG TERM GOAL #4   Title  Pt will verbalize plans for continued community fitness upon d/c from PT.    Time  8    Status  Achieved            Plan - 04/04/18 1450    Clinical Impression Statement  Pt has met LTG 1 and 4.  Pt continues to have festinating/freezing episodes, is able  to verbalize how to get out of these episodes, and needs extra time and cues for getting out of freezing episode.  Discussed aquatic therapy, and patient in agreement to trying aquatic therapy as a way to  continue to address strength, balance, and endurance for improved overall mobility.    Rehab Potential  Good    Clinical Impairments Affecting Rehab Potential  for PT goals/tx plan    PT Frequency  1x / week    PT Duration  4 weeks   per recert 9/62/83   PT Treatment/Interventions  ADLs/Self Care Home Management;DME Instruction;Gait training;Functional mobility training;Therapeutic activities;Therapeutic exercise;Balance training;Patient/family education;Neuromuscular re-education;Aquatic Therapy    PT Next Visit Plan  Aquatic therapy added to HEP-to continue with Guido Sander, PT at [facility]; upon discharge, discussed having patient schedule return 6 months eval.    Consulted and Agree with Plan of Care  Patient       Patient will benefit from skilled therapeutic intervention in order to improve the following deficits and impairments:  Abnormal gait, Decreased balance, Decreased mobility, Difficulty walking, Decreased safety awareness, Decreased strength, Postural dysfunction  Visit Diagnosis: Other abnormalities of gait and mobility  Unsteadiness on feet  Muscle weakness (generalized)  Other symptoms and signs involving the nervous system     Problem List Patient Active Problem List   Diagnosis Date Noted  . Strain of right biceps 09/12/2017  . Lymphedema 02/10/2017  . BPH associated with nocturia 05/16/2016  . Senile purpura ([place]) 05/03/2016  . OSA (obstructive sleep apnea) 12/28/2015  . Hypersomnia 11/15/2015  . Cough 11/15/2015  . 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 Dizziness 12/30/2014  . Diastolic dysfunction 66/29/4765  . Former smoker 04/29/2014  . Chronic venous insufficiency 02/20/2014  . Hyperglycemia 08/02/2012  . Obesity 07/13/2010  . History of malignant melanoma. left leg 03/09/2009  . Insomnia 10/03/2007  . Hyperlipidemia 12/13/2006  . Essential hypertension 12/13/2006    Etoy Mcdonnell W. 04/04/2018, 2:56 PM  [name]., PT   [facility] [address] [place], [place], [address] Phone: [phone]   Fax:  [phone]  Name: Nicholas [name] MRN: [id number] Date of Birth: 1946-04-30

## 2018-05-07 ENCOUNTER — Ambulatory Visit (INDEPENDENT_AMBULATORY_CARE_PROVIDER_SITE_OTHER): Payer: PPO

## 2018-05-07 DIAGNOSIS — Z23 Encounter for immunization: Secondary | ICD-10-CM

## 2018-05-07 DIAGNOSIS — G4733 Obstructive sleep apnea (adult) (pediatric): Secondary | ICD-10-CM | POA: Diagnosis not present

## 2018-05-08 ENCOUNTER — Ambulatory Visit: Payer: PPO | Attending: Family Medicine | Admitting: Physical Therapy

## 2018-05-08 DIAGNOSIS — R2681 Unsteadiness on feet: Secondary | ICD-10-CM | POA: Diagnosis not present

## 2018-05-08 DIAGNOSIS — R2689 Other abnormalities of gait and mobility: Secondary | ICD-10-CM | POA: Insufficient documentation

## 2018-05-08 DIAGNOSIS — M6281 Muscle weakness (generalized): Secondary | ICD-10-CM | POA: Diagnosis not present

## 2018-05-09 ENCOUNTER — Encounter: Payer: Self-pay | Admitting: Physical Therapy

## 2018-05-09 NOTE — Therapy (Signed)
[facility] [address] [place], [place], [address] Phone: [phone]   Fax:  [phone]  Physical Therapy Treatment  Patient Details  Name: Nicholas [name] MRN: [id number] Date of Birth: 1946/03/27 Referring Provider (PT): [name]   Encounter Date: 05/08/2018  PT End of Session - 05/09/18 2042    Visit Number  16    Number of Visits  19    Date for PT Re-Evaluation  06/02/18    Authorization Type  [employer] (Medicare)-will need 10th visit progress note    Authorization Time Period  cert 4/68/03-09/04/22    PT Start Time  1505    PT Stop Time  1550    PT Time Calculation (min)  45 min       Past Medical History:  Diagnosis Date  . 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 Hyperlipidemia   . Hypertension   . Lymphedema     Past Surgical History:  Procedure Laterality Date  . 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 removal of melanoma of left foot/amputation of toes 1&25 Jul 2006  . WRIST FRACTURE SURGERY     72 years old-set    There were no vitals filed for this visit.  Subjective Assessment - 05/09/18 2041    Subjective  Pt states it has been about 2 years since he has been in the pool - is ready to try it today    Pertinent History  Lymphedema-has compression machine and uses stockings (wife helps); hx of PD/Parkinson's Plus Syndrone; malignant melanoma, hyperlipidemia, HTN, insomnia, obesity, hyperglycemia, dizziness, OSA, urinary urgency    Patient Stated Goals  Pt's goals for therapy are to "be like I used to be."    Currently in Pain?  No/denies        Aquatic Therapy - pool temp 87 degrees Pt entered pool by amb. With RW to steps; entered and exited pool by step negotiation with bil. Hand rails using step by step sequence with CGA  Pt performed gait training in pool - in 4' depth of water; forwards, backwards and sideways with cues for big  steps; cues for big steps and for big arm movement    Used Ai Chi postures for trunk control/stabilization/strengthening - UE and LE movement with trunk stabilization  Pt performed hip strengthening exs. - forward, back and side kicks 10 reps each leg with UE support on noodle Marching in place 10 reps each leg  Heel raises 10 reps with UE support; toe raises 10 reps with UE support  Pt performed standing balance exercise - used the royal blue colored tile on pool floor to have pt step over this blue tile and step back behind it for facilitation of posterior weight shift  with SLS; stepping up/back 10 reps each leg, then stepping out/in 10 reps each leg   Practiced Ai Chi posture incorporating pivot turn to Rt and Lt sides with UE movement  - pt had difficulty turning feet with pivot turn   Pt requires aquatic therapy for the hydrostatic pressure to assist with circulation for his LE lymphedema and for the buoyancy for support with balance activities to prevent falling                       PT Short Term Goals - 02/28/18 2005      PT SHORT TERM GOAL #  1   Title  Pt will be independent with HEP for improved balance, transfers and gait.  Updated TARGET 03/01/18    Time  4    Period  Weeks    Status  Achieved      PT SHORT TERM GOAL #2   Title  Pt will improve TUG score to less than or equal to 50 seconds for decreased fall risk.    Baseline  02/28/18 34.5 sec    Time  4    Period  Weeks    Status  Achieved      PT SHORT TERM GOAL #3   Title  Pt wil perform at least 8 of 10 reps of sit<>stand transfers from 18 inch surfaces, with no posterior lean, independently for improved transfer efficiency and safety.    Baseline  8/8 9 of 10    Time  4    Period  Weeks    Status  Achieved      PT SHORT TERM GOAL #4   Title  Pt will verbalize undersatnding of techniques to reduce freezing episodes with gait and turns    Baseline  02/28/18 pt able to verbalize (continues to need  vc at times to implement)    Time  4    Period  Weeks    Status  Achieved        PT Long Term Goals - 04/04/18 1450      PT LONG TERM GOAL #1   Title   Pt will perform updated exercises as part of aquatic therapy exercise program, for improved mobility, strength, balance, endurance.  TARGET = 4 weeks     Time  4    Period  Weeks    Status  New      PT LONG TERM GOAL #2   Title       Time  --    Period  --    Status  --      PT LONG TERM GOAL #3   Title       Time  8    Period  --    Status  --      PT LONG TERM GOAL #4   Title       Time  --    Status  --            Plan - 05/09/18 2043    Clinical Impression Statement  Pt tolerated aquatic therapy well -c/o legs felt very heavy when exiting pool; pt demonstrates decreased trunk control as evidenced by LOB with Ai Chi postures     Rehab Potential  Good    Clinical Impairments Affecting Rehab Potential  for PT goals/tx plan    PT Frequency  1x / week    PT Duration  4 weeks    PT Treatment/Interventions  ADLs/Self Care Home Management;DME Instruction;Gait training;Functional mobility training;Therapeutic activities;Therapeutic exercise;Balance training;Patient/family education;Neuromuscular re-education;Aquatic Therapy    PT Next Visit Plan  Aquatic therapy added to HEP-to continue with Guido Sander, PT at [facility]; upon discharge, discussed having patient schedule return 6 months eval.    Consulted and Agree with Plan of Care  Patient       Patient will benefit from skilled therapeutic intervention in order to improve the following deficits and impairments:  Abnormal gait, Decreased balance, Decreased mobility, Difficulty walking, Decreased safety awareness, Decreased strength, Postural dysfunction  Visit Diagnosis: Other abnormalities of gait and mobility  Unsteadiness on feet  Muscle weakness (generalized)  Problem List Patient Active Problem List   Diagnosis Date Noted  . Strain of right biceps  09/12/2017  . Lymphedema 02/10/2017  . BPH associated with nocturia 05/16/2016  . Senile purpura ([place]) 05/03/2016  . OSA (obstructive sleep apnea) 12/28/2015  . Hypersomnia 11/15/2015  . Cough 11/15/2015  . 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 Dizziness 12/30/2014  . Diastolic dysfunction 33/83/2919  . Former smoker 04/29/2014  . Chronic venous insufficiency 02/20/2014  . Hyperglycemia 08/02/2012  . Obesity 07/13/2010  . History of malignant melanoma. left leg 03/09/2009  . Insomnia 10/03/2007  . Hyperlipidemia 12/13/2006  . Essential hypertension 12/13/2006    Alda Lea, PT 05/09/2018, 8:48 PM  [facility] [address] [place], [place], [address] Phone: [phone]   Fax:  [phone]  Name: PENIEL HASS MRN: [id number] Date of Birth: 10/10/1945

## 2018-05-14 ENCOUNTER — Emergency Department (HOSPITAL_COMMUNITY): Payer: PPO

## 2018-05-14 ENCOUNTER — Emergency Department (HOSPITAL_COMMUNITY)
Admission: EM | Admit: 2018-05-14 | Discharge: 2018-05-15 | Disposition: A | Discharge status: Home / Self Care | Payer: PPO | Attending: Emergency Medicine | Admitting: Emergency Medicine

## 2018-05-14 ENCOUNTER — Other Ambulatory Visit: Payer: Self-pay

## 2018-05-14 ENCOUNTER — Encounter (HOSPITAL_COMMUNITY): Payer: Self-pay | Admitting: Emergency Medicine

## 2018-05-14 DIAGNOSIS — G20 Parkinson's disease: Secondary | ICD-10-CM | POA: Insufficient documentation

## 2018-05-14 DIAGNOSIS — S51012A Laceration without foreign body of left elbow, initial encounter: Secondary | ICD-10-CM | POA: Insufficient documentation

## 2018-05-14 DIAGNOSIS — S43015A Anterior dislocation of left humerus, initial encounter: Secondary | ICD-10-CM

## 2018-05-14 DIAGNOSIS — Y92008 Other place in unspecified non-institutional (private) residence as the place of occurrence of the external cause: Secondary | ICD-10-CM | POA: Insufficient documentation

## 2018-05-14 DIAGNOSIS — Y999 Unspecified external cause status: Secondary | ICD-10-CM | POA: Insufficient documentation

## 2018-05-14 DIAGNOSIS — W19XXXA Unspecified fall, initial encounter: Secondary | ICD-10-CM

## 2018-05-14 DIAGNOSIS — I10 Essential (primary) hypertension: Secondary | ICD-10-CM | POA: Diagnosis not present

## 2018-05-14 DIAGNOSIS — Y9301 Activity, walking, marching and hiking: Secondary | ICD-10-CM | POA: Diagnosis not present

## 2018-05-14 DIAGNOSIS — W109XXA Fall (on) (from) unspecified stairs and steps, initial encounter: Secondary | ICD-10-CM | POA: Diagnosis not present

## 2018-05-14 DIAGNOSIS — S43005A Unspecified dislocation of left shoulder joint, initial encounter: Secondary | ICD-10-CM | POA: Diagnosis not present

## 2018-05-14 DIAGNOSIS — Z79899 Other long term (current) drug therapy: Secondary | ICD-10-CM | POA: Insufficient documentation

## 2018-05-14 DIAGNOSIS — Z87891 Personal history of nicotine dependence: Secondary | ICD-10-CM | POA: Insufficient documentation

## 2018-05-14 DIAGNOSIS — S4992XA Unspecified injury of left shoulder and upper arm, initial encounter: Secondary | ICD-10-CM | POA: Diagnosis present

## 2018-05-14 DIAGNOSIS — M79603 Pain in arm, unspecified: Secondary | ICD-10-CM | POA: Diagnosis not present

## 2018-05-14 DIAGNOSIS — R52 Pain, unspecified: Secondary | ICD-10-CM | POA: Diagnosis not present

## 2018-05-14 MED ORDER — KETAMINE HCL 10 MG/ML IJ SOLN
INTRAMUSCULAR | Status: AC | PRN
  Administered 2018-05-14: 50 mg via INTRAVENOUS

## 2018-05-14 MED ORDER — MORPHINE SULFATE (PF) 4 MG/ML IV SOLN
4.0000 mg | Freq: Once | INTRAVENOUS | Status: AC
  Administered 2018-05-14: 4 mg via INTRAVENOUS
  Filled 2018-05-14: qty 1

## 2018-05-14 MED ORDER — PROPOFOL 10 MG/ML IV BOLUS
INTRAVENOUS | Status: AC | PRN
  Administered 2018-05-14: 40 mg via INTRAVENOUS

## 2018-05-14 MED ORDER — ONDANSETRON HCL 4 MG/2ML IJ SOLN
4.0000 mg | Freq: Once | INTRAMUSCULAR | Status: AC
  Administered 2018-05-14: 4 mg via INTRAVENOUS
  Filled 2018-05-14: qty 2

## 2018-05-14 MED ORDER — PROPOFOL 10 MG/ML IV BOLUS
0.5000 mg/kg | Freq: Once | INTRAVENOUS | Status: AC
  Administered 2018-05-14: 40 mg via INTRAVENOUS
  Filled 2018-05-14: qty 20

## 2018-05-14 MED ORDER — KETAMINE HCL 50 MG/5ML IJ SOSY
50.0000 mg | PREFILLED_SYRINGE | Freq: Once | INTRAMUSCULAR | Status: AC
  Administered 2018-05-14: 50 mg via INTRAVENOUS
  Filled 2018-05-14: qty 5

## 2018-05-14 NOTE — ED Notes (Signed)
Provided patient water with permission from Dr. Alfonso Patten. Rex Kras.

## 2018-05-14 NOTE — ED Provider Notes (Signed)
[facility] DEPT Provider Note   CSN: [id number] Arrival date & time: 05/14/18  1921     History   Chief Complaint Chief Complaint  Nicholas [name] presents with  . Fall  . Shoulder Pain    HPI Nicholas [name] is a 72 y.o. male.  72 year old male with past medical history below including Parkinson's disease who presents with left shoulder pain.  Just prior to arrival, Nicholas [name] was going downstairs at home and tripped, falling onto his left shoulder.  Nicholas [name] has severe, constant pain in his left upper arm near the shoulder.  No head injury or loss of consciousness.  Some tingling in his fingers but no numbness, no other areas of pain.  No anticoagulant use.  Tetanus is up-to-date.  Last p.o. intake was at 2 PM.  The history is provided by the Nicholas [name].  Fall   Shoulder Pain   Pertinent negatives include no numbness.    Past Medical History:  Diagnosis Date  . 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 Hyperlipidemia   . Hypertension   . Lymphedema     Nicholas [name] Active Problem List   Diagnosis Date Noted  . Strain of right biceps 09/12/2017  . Lymphedema 02/10/2017  . BPH associated with nocturia 05/16/2016  . Senile purpura ([place]) 05/03/2016  . OSA (obstructive sleep apnea) 12/28/2015  . Hypersomnia 11/15/2015  . Cough 11/15/2015  . 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 Dizziness 12/30/2014  . Diastolic dysfunction 63/87/5643  . Former smoker 04/29/2014  . Chronic venous insufficiency 02/20/2014  . Hyperglycemia 08/02/2012  . Obesity 07/13/2010  . History of malignant melanoma. left leg 03/09/2009  . Insomnia 10/03/2007  . Hyperlipidemia 12/13/2006  . Essential hypertension 12/13/2006    Past Surgical History:  Procedure Laterality Date  . 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 removal of melanoma of left foot/amputation of toes 1&25 Jul 2006  . WRIST FRACTURE SURGERY     72 years old-set         Home Medications    Prior to Admission medications   Medication Sig Start Date End Date Taking? Authorizing Provider  carbidopa-levodopa (SINEMET IR) 25-100 MG tablet 2 tablets at 7am/11am/3pm and 1 tablet at 7pm 01/08/18  Yes [name], [name], DO  diphenhydrAMINE (BENADRYL) 25 mg capsule Take 25 mg by mouth at bedtime.   Yes [provider]  fesoterodine (TOVIAZ) 8 MG TB24 tablet Take 8 mg by mouth daily.   Yes [provider]  ibuprofen (ADVIL,MOTRIN) 200 MG tablet Take 400 mg by mouth every 6 (six) hours as needed for moderate pain.   Yes [provider]  rosuvastatin (CRESTOR) 10 MG tablet TAKE 1 TABLET ONCE DAILY. 02/08/18  Yes [name], MD    Family History Family History  Problem Relation Age of Onset  . Hypertension Father   . CVA Father        age 77  . Hyperlipidemia Father   . Other Mother        Deceased  . Healthy Sister   . Healthy Brother     Social History Social History   Tobacco Use  . Smoking status: Former Smoker    Packs/day: 1.00    Years: 16.00    Pack years: 16.00    Types: Cigarettes    Last attempt to quit: 12/22/1993    Years since quitting: 24.4  . Smokeless  tobacco: Never Used  Substance Use Topics  . Alcohol use: No    Alcohol/week: 21.0 standard drinks    Types: 21 Standard drinks or equivalent per week    Comment: one time every few months now  . Drug use: No     Allergies   Amoxicillin   Review of Systems Review of Systems  Musculoskeletal: Positive for joint swelling.  Skin: Positive for wound.  Neurological: Negative for syncope and numbness.     Physical Exam Updated Vital Signs BP (!) 168/98   Pulse 93   Temp 97.9 F (36.6 C) (Oral)   Resp 16   Ht 5\' 7"  (1.702 m)   Wt 107 kg   SpO2 100%   BMI 36.96 kg/m   Physical Exam  Constitutional: Nicholas [name] is oriented to person, place, and time. Nicholas [name] appears well-developed and well-nourished. No distress.  HENT:  Head: Normocephalic  and atraumatic.  Eyes: Conjunctivae are normal.  Neck: Neck supple.  Cardiovascular: Intact distal pulses.  Musculoskeletal: Nicholas [name] exhibits tenderness and deformity.  Tenderness proximal L humerus, shoulder squared off and internally rotated with arm held by side; no elbow tenderness 3+ pitting edema BLE  Neurological: Nicholas [name] is alert and oriented to person, place, and time. No sensory deficit. Nicholas [name] exhibits normal muscle tone.  Skin: Skin is warm and dry.  Skin tear L elbow  Psychiatric: Nicholas [name] has a normal mood and affect. Judgment normal.  Nursing note and vitals reviewed.    ED Treatments / Results  Labs (all labs ordered are listed, but only abnormal results are displayed) Labs Reviewed - No data to display  EKG None  Radiology Dg Shoulder Left  Result Date: 05/14/2018 CLINICAL DATA:  72 y/o  M; fall on left shoulder. EXAM: LEFT SHOULDER - 2+ VIEW COMPARISON:  None. FINDINGS: Anterior shoulder dislocation. Mild irregularity of the inferior glenoid rim may represent a [place] fracture. Normal acromioclavicular and coracoclavicular intervals. IMPRESSION: Anterior shoulder dislocation. Mild irregularity of the inferior glenoid rim may represent a [place]art fracture. Electronically Signed   By: Kristine Garbe M.D.   On: 05/14/2018 21:04   Dg Shoulder Left Portable  Result Date: 05/14/2018 CLINICAL DATA:  Post reduction EXAM: LEFT SHOULDER - 1 VIEW COMPARISON:  05/14/2018 FINDINGS: Reduction of anterior shoulder dislocation without obvious fracture. Mild AC joint degenerative change. The previously noted irregularity of the inferior glenoid rim is not as well appreciated on the current exam. IMPRESSION: Reduction of previously noted shoulder dislocation. Electronically Signed   By: Donavan Foil M.D.   On: 05/14/2018 22:34    Procedures Reduction of dislocation Date/Time: 05/14/2018 11:26 PM Performed by: Sharlett Iles, MD Authorized by: Sharlett Iles, MD  Consent:  Written consent obtained. Risks and benefits: risks, benefits and alternatives were discussed Consent given by: Nicholas [name] Nicholas [name] understanding: Nicholas [name] states understanding of the procedure being performed Nicholas [name] identity confirmed: verbally with Nicholas [name] Time out: Immediately prior to procedure a "time out" was called to verify the correct Nicholas [name], procedure, equipment, support staff and site/side marked as required. Local anesthesia used: no  Anesthesia: Local anesthesia used: no  Sedation: Nicholas [name] sedated: yes Sedation type: moderate (conscious) sedation Sedatives: ketamine and propofol Vitals: Vital signs were monitored during sedation.  Nicholas [name] tolerance: Nicholas [name] tolerated the procedure well with no immediate complications  .Sedation Date/Time: 05/14/2018 11:27 PM Performed by: Sharlett Iles, MD Authorized by: Sharlett Iles, MD   Consent:    Consent obtained:  Written   Consent given by:  Nicholas [name]  Risks discussed:  Allergic reaction, inadequate sedation, nausea, vomiting, respiratory compromise necessitating ventilatory assistance and intubation and prolonged sedation necessitating reversal   Alternatives discussed:  Analgesia without sedation Universal protocol:    Immediately prior to procedure a time out was called: yes   Indications:    Procedure performed:  Dislocation reduction   Procedure necessitating sedation performed by:  Physician performing sedation Pre-sedation assessment:    Time since last food or drink:  8   ASA classification: class 2 - Nicholas [name] with mild systemic disease     Neck mobility: normal     Mouth opening:  3 or more finger widths   Thyromental distance:  4 finger widths   Mallampati score:  III - soft palate, base of uvula visible   Pre-sedation assessments completed and reviewed: airway patency, cardiovascular function, mental status, pain level and respiratory function   Immediate pre-procedure details:    Reviewed: vital  signs and NPO status     Verified: bag valve mask available, emergency equipment available, intubation equipment available, IV patency confirmed, oxygen available and suction available   Procedure details (see MAR for exact dosages):    Preoxygenation:  Nasal cannula   Sedation:  Ketamine and propofol   Intra-procedure monitoring:  Blood pressure monitoring, cardiac monitor, continuous pulse oximetry, frequent LOC assessments and frequent vital sign checks   Intra-procedure events: none     Intra-procedure management:  Airway repositioning   Total Provider sedation time (minutes):  10 Post-procedure details:    Attendance: Constant attendance by certified staff until Nicholas [name] recovered     Recovery: Nicholas [name] returned to pre-procedure baseline     Post-sedation assessments completed and reviewed: airway patency, cardiovascular function, mental status, nausea/vomiting, pain level and respiratory function     Nicholas [name] is stable for discharge or admission: yes     Nicholas [name] tolerance:  Tolerated well, no immediate complications   (including critical care time)  Medications Ordered in ED Medications  ondansetron (ZOFRAN) injection 4 mg (4 mg Intravenous Given 05/14/18 2038)  morphine 4 MG/ML injection 4 mg (4 mg Intravenous Given 05/14/18 2038)  ketamine 50 mg in normal saline 5 mL (10 mg/mL) syringe (50 mg Intravenous Given by Other 05/14/18 2144)  propofol (DIPRIVAN) 10 mg/mL bolus/IV push 53.5 mg (40 mg Intravenous Given by Other 05/14/18 2146)  ketamine (KETALAR) injection (50 mg Intravenous Given 05/14/18 2144)  propofol (DIPRIVAN) 10 mg/mL bolus/IV push (40 mg Intravenous Given 05/14/18 2146)     Initial Impression / Assessment and Plan / ED Course  I have reviewed the triage vital signs and the nursing notes.  Pertinent imaging results that were available during my care of the Nicholas [name] were reviewed by me and considered in my medical decision making (see chart for details).      Neurovascularly intact.  X-ray shows anterior shoulder dislocation.  Discussed options for reduction with Nicholas [name] and Nicholas [name] elected to have procedural sedation.  See procedure note for details.  Nicholas [name] tolerated well, repeat x-rays show successful reduction.  Placed in sling and instructed to follow-up with orthopedics.  Discussed supportive measures with the Nicholas [name] and his wife, reviewed return precautions.  They voiced understanding.  Final Clinical Impressions(s) / ED Diagnoses   Final diagnoses:  Anterior dislocation of left shoulder, initial encounter  Fall, initial encounter    ED Discharge Orders    None       Dempsy Damiano, Wenda Overland, MD 05/14/18 2329

## 2018-05-14 NOTE — ED Notes (Signed)
Patient offered ice for shoulder but denied wanting any at this time. Ria Comment, RN aware.

## 2018-05-14 NOTE — ED Notes (Signed)
Patient given ice pack and continually calling out for pain medicine. Ria Comment, RN aware.

## 2018-05-14 NOTE — ED Triage Notes (Signed)
Pt arriving from home via [facility]. Pt fell down one step, landed on left shoulder. Obvious deformity noted in left shoulder. Endorses numbness/tingling but can move fingers. Pulses present. Denies LOC. States he did not hit his head.

## 2018-05-14 NOTE — ED Notes (Signed)
Bed: WA09 Expected date:  Expected time:  Means of arrival:  Comments: EMS 72 yo male from home/fall landed on left shoulder with positive deformity IV access-refuses pain meds

## 2018-05-20 DIAGNOSIS — S43005A Unspecified dislocation of left shoulder joint, initial encounter: Secondary | ICD-10-CM | POA: Diagnosis not present

## 2018-05-22 ENCOUNTER — Ambulatory Visit: Payer: PPO | Admitting: Physical Therapy

## 2018-05-25 DIAGNOSIS — M25512 Pain in left shoulder: Secondary | ICD-10-CM | POA: Diagnosis not present

## 2018-05-29 NOTE — Progress Notes (Signed)
Nicholas [name] was seen today in the movement disorders clinic for neurologic consultation at the request of [name].  The consultation is for the evaluation of [name] disease.  I have reviewed records both from [name] as welll as from [name].  The patient is accompanied by his wife who supplements the history.  The patient first presented to [name] in June, 2016 with lightheadedness and dizziness.  He reported that that symptom had been present since 2015.  [name] noted that the patient looked parkinsonian.  In September, 2016 she started him on ropinirole, 0.25 mg twice a day but he stopped the medication after only a few days because of worsening of dizziness.  He saw Dr. Nicki Reaper in December, 2016 for a second opinion and Dr. Nicki Reaper felt that he had idiopathic Parkinson's disease and started him on levodopa.  He last saw Dr. Nicki Reaper in February, 2016 and he increased the patient's carbidopa/levodopa 25/100-1-1/2 tablets in the morning, one tablet in the afternoon and one tablet in the evening.  The patient is still on this dosage.  The patient had an MRI of the brain that I had the opportunity to review.  This was done without gadolinium in October, 2016.  There were only a few scattered T2 hyperintensities and it was otherwise unremarkable.  07/31/16 update:  The patient follows up today.  He is on carbidopa/levodopa 25/100, 1-1/2 tablets in the morning, one tablet in the afternoon and one in the evening.  Pt denies falls.  Admits to lightheadedness but no near syncope.  No hallucinations.  Mood has been good.  No longer in [employer] moves classes but in [place] biking classes (monday/thurs).    11/28/16 update:  Patient seen today in follow-up.  Patients levodopa was increased last visit to carbidopa/levodopa 25/100, 1-1/2 tablets 3 times per day.  He states that made him dizzy.  Pt denies falls. No hallucinations.  Mood has been good.  Went to [facility] for his lymphedema and just  increased his mm Hg on his lymphedema.  Saw Dr. Nicki Reaper since our last visit as well.  Told him to increase levodopa to 1.5/1.5/1.  Going to [facility] cycle class 2 days a week.  Starting PT on 5/24.    04/03/17 update:  Pt f/u today for PD, accompanied by his wife, who supplements the history.  The records that were made available to me were reviewed since last visit.  On carbidopa/levodopa 25/100, 1.5 tablets at 6am/1.5 tabletsat 12:00pm/1 tablet at 5pm.  "I need drugs, girl."  More freezing but doesn't know if med responsive.  Been to PT/OT/ST since last visit both for PD as well as for his lymphedema.  Wearing off:  Doesn't know  How long before next dose:  ? Falls:   Yes.    Exercising: mon/thurs [employer] cyle class N/V:  Yes.   (AM nausea upon awakening before meds but goes away) Hallucinations:  No.  visual distortions: No. Lightheaded:  Yes.    Syncope: No. Dyskinesia:  No.   08/07/17 update: Patient is seen today in follow-up.  He is accompanied by his wife who supplements the history.  The patient is on carbidopa/levodopa 25/100, 1-1/2 tablets 4 times per day.  This was increased from 3 times per day last visit.  He doesn't know if it helped but he thinks that it did. Doing [employer] spin class 2 days per week. He is drinking water but "not enough."  He may not  be drinking 8 oz per day.  He states that he has a bladder issue - tried flomax but caused dizziness.  Tried myrbetriq but no helping.   Pt denies falls.  Pt has some lightheadedness, but no near syncope.  No hallucinations.  Mood has been good.  01/08/18 update: Patient is seen today in follow-up for Parkinson's.  He is accompanied by his wife who supplements the history.  He is on carbidopa/levodopa 25/100, 1-1/2 tablets 3 times per day and an additional tablet in evening. Records have been reviewed since our last visit.  The patient went to [facility] on October 02, 2017.  He saw the physician assistant.  No changes were made to his medication  regimen and he was told to follow-up in 6 months.  He saw urology since our last visit and they started him on myrbetriq.  He too it for 5 days but had dizziness and d/c it.  "I quit taking it and now I wear depends at night."  Patient just went for his therapy screens at the end of May.  It was determined that he did need therapy and orders were sent.  He just started that today.  He is doing spin class at [facility].  Has a walker that he just got 3 weeks ago.    05/30/18 update: Patient is seen today in follow-up for Parkinson's disease, accompanied by his wife who supplements the history.  Patient remains on carbidopa/levodopa 25/100, 2/2/2/1 (this was an increased last visit).  Records are reviewed since last visit.  Patient was in the emergency room on May 14, 2018 after a fall which caused a shoulder dislocation.  He was walking down the stairs when it happened.  He is seeing Dr. Noemi Chapel.  Has to be in the sling for 4-5 weeks.  No other falls.  However, he admits he is taking his arm out of the sling to walk in the walker.  Otherwise, he cannot walk.  He is not able to get in and out of the bed because the bed is up high.  Therefore, he has been sleeping on the sofa.  His legs are swelling more and lymphedema is getting worse because of that.  He was in water therapy prior to falling and in spin class prior to the fall.     PREVIOUS MEDICATIONS: Sinemet and requip (only few days and was dizzy and was d/c)  ALLERGIES:   Allergies  Allergen Reactions  . Amoxicillin Swelling    Presumed angioedema of lips due to prednisone    CURRENT MEDICATIONS:  Outpatient Encounter Medications as of 05/30/2018  Medication Sig  . carbidopa-levodopa (SINEMET IR) 25-100 MG tablet 2 tablets at 7am/11am/3pm and 1 tablet at 7pm  . diphenhydrAMINE (BENADRYL) 25 mg capsule Take 25 mg by mouth at bedtime.  . fesoterodine (TOVIAZ) 8 MG TB24 tablet Take 8 mg by mouth daily.  Marland Kitchen ibuprofen (ADVIL,MOTRIN) 200 MG tablet Take  400 mg by mouth every 6 (six) hours as needed for moderate pain.  . rosuvastatin (CRESTOR) 10 MG tablet TAKE 1 TABLET ONCE DAILY.   No facility-administered encounter medications on file as of 05/30/2018.     PAST MEDICAL HISTORY:   Past Medical History:  Diagnosis Date  . 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 Hyperlipidemia   . Hypertension   . Lymphedema     PAST SURGICAL HISTORY:   Past Surgical History:  Procedure Laterality  Date  . 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 removal of melanoma of left foot/amputation of toes 1&25 Jul 2006  . WRIST FRACTURE SURGERY     72 years old-set    SOCIAL HISTORY:   Social History   Socioeconomic History  . Marital status: Married    Spouse name: Not on file  . Number of children: Not on file  . Years of education: Not on file  . Highest education level: Not on file  Occupational History  . Occupation: retired    Comment: Engineer, maintenance (IT)  Social Needs  . Financial resource strain: Not on file  . Food insecurity:    Worry: Not on file    Inability: Not on file  . Transportation needs:    Medical: Not on file    Non-medical: Not on file  Tobacco Use  . Smoking status: Former Smoker    Packs/day: 1.00    Years: 16.00    Pack years: 16.00    Types: Cigarettes    Last attempt to quit: 12/22/1993    Years since quitting: 24.4  . Smokeless tobacco: Never Used  Substance and Sexual Activity  . Alcohol use: No    Alcohol/week: 21.0 standard drinks    Types: 21 Standard drinks or equivalent per week    Comment: one time every few months now  . Drug use: No  . Sexual activity: Not on file  Lifestyle  . Physical activity:    Days per week: Not on file    Minutes per session: Not on file  . Stress: Not on file  Relationships  . Social connections:    Talks on phone: Not on file    Gets together: Not on file    Attends religious service: Not on file    Active member of club or organization:  Not on file    Attends meetings of clubs or organizations: Not on file    Relationship status: Not on file  . Intimate partner violence:    Fear of current or ex partner: Not on file    Emotionally abused: Not on file    Physically abused: Not on file    Forced sexual activity: Not on file  Other Topics Concern  . Not on file  Social History Narrative   Married 1981. Wife has kids-1 with 1 adopted grandchild.       Retired Tax adviser      Highest level of education:  B.S.      Hobbies: antiques, former Air cabin crew      Exercise: none currently.     FAMILY HISTORY:   Family Status  Relation Name Status  . Father  Deceased at age 38  . Mother  Deceased at age 19  . Sister  Alive  . Brother  Alive    ROS:  Review of Systems  Constitutional: Negative.   HENT: Negative.   Respiratory: Negative.   Cardiovascular: Negative.   Gastrointestinal: Negative.   Genitourinary: Negative.   Musculoskeletal: Negative.   Skin: Negative.      PHYSICAL EXAMINATION:    VITALS:   Vitals:   05/30/18 1512  BP: 134/74  Pulse: 94  SpO2: 95%    GEN:  The patient appears stated age and is in NAD. HEENT:  Normocephalic, atraumatic.  The mucous membranes are moist. The superficial temporal arteries are without ropiness or tenderness. CV: Regular rate rhythm Lungs: Clear to auscultation bilaterally Neck/HEME:  There are no carotid bruits bilaterally. Skin:  LE edema is significant but chronic.  Patient unable to get compression stocking on because of arm in sling.  Neurological examination:  Orientation: The patient is alert and oriented x3. Cranial nerves: There is good facial symmetry. The speech is fluent and clear. Soft palate rises symmetrically and there is no tongue deviation. Hearing is intact to conversational tone. Sensation: Sensation is intact to light touch throughout Motor: Strength is at least antigravity x4.  Left arm is in a sling.  Movement examination: Tone:  There is good tone in the upper and lower extremities. Abnormal movements: none Coordination:  There is mild trouble with toe taps bilaterally.  He is unable to do rapid alternating movements in the left arm because it is in a sling.  They are good on the right arm, with the exception of alternation of supination/pronation of the forearm on the right. Gait and Station: The patient is unable to ambulate.  He generally uses the walker, but because of his arm being in a sling from the shoulder dislocation, he cannot use the walker.   ASSESSMENT/PLAN:  1.  Idiopathic Parkinson's disease but I do think that PSP could be on the Ddx as he does have a fixed face stare/eyes.    -We discussed the diagnosis as well as pathophysiology of the disease.  We discussed treatment options as well as prognostic indicators.  Patient education was provided.  -Patient would like to increase his levodopa to carbidopa/levodopa 25/100, 2 tablets 4 times per day.  I have no objection to that.  -We will send home physical therapy to the house.  He is having trouble getting up onto his bed.  In addition, he has no way to use the walker because his arm is in a sling.  I expressed to him that there are walkers that can be adapted for this.  However, he is only expected to be in a sling for about 4 to 5 weeks.  -Wife asked me about Inbrija.  I really do not think that will be particularly useful for him.  His biggest issue is freezing in doorways, crowded spaces and start hesitation.  I do not think Inbrija is going to help him with this. 2.  History of melanoma  -last dermatology visit was in July 3.  Vasomotor rhinorrhea  -mild and he is not ready for meds 4.  HTN  -now off meds and feels that he is doing well. Following with Dr. Yong Channel and his notes reviewed. 5.  Urinary incontinence  -having continued trouble.  Encouraged him to follow back up with urology.  He had side effects with Myrbetriq. 6.  Follow up is anticipated  in the next few months, sooner should new neurologic issues arise.  Much greater than 50% of this visit was spent in counseling and coordinating care.  Total face to face time:  25 min

## 2018-05-30 ENCOUNTER — Encounter: Payer: Self-pay | Admitting: Neurology

## 2018-05-30 ENCOUNTER — Ambulatory Visit (INDEPENDENT_AMBULATORY_CARE_PROVIDER_SITE_OTHER): Payer: PPO | Admitting: Neurology

## 2018-05-30 VITALS — BP 134/74 | HR 94

## 2018-05-30 DIAGNOSIS — G20 Parkinson's disease: Secondary | ICD-10-CM | POA: Diagnosis not present

## 2018-05-30 NOTE — Patient Instructions (Signed)
We will refer you to home health.

## 2018-06-05 ENCOUNTER — Telehealth: Payer: Self-pay | Admitting: Neurology

## 2018-06-05 DIAGNOSIS — G4733 Obstructive sleep apnea (adult) (pediatric): Secondary | ICD-10-CM | POA: Diagnosis not present

## 2018-06-05 DIAGNOSIS — Z87891 Personal history of nicotine dependence: Secondary | ICD-10-CM | POA: Diagnosis not present

## 2018-06-05 DIAGNOSIS — N401 Enlarged prostate with lower urinary tract symptoms: Secondary | ICD-10-CM | POA: Diagnosis not present

## 2018-06-05 DIAGNOSIS — E785 Hyperlipidemia, unspecified: Secondary | ICD-10-CM | POA: Diagnosis not present

## 2018-06-05 DIAGNOSIS — G20 Parkinson's disease: Secondary | ICD-10-CM | POA: Diagnosis not present

## 2018-06-05 DIAGNOSIS — S43005D Unspecified dislocation of left shoulder joint, subsequent encounter: Secondary | ICD-10-CM | POA: Diagnosis not present

## 2018-06-05 DIAGNOSIS — R351 Nocturia: Secondary | ICD-10-CM | POA: Diagnosis not present

## 2018-06-05 DIAGNOSIS — I10 Essential (primary) hypertension: Secondary | ICD-10-CM | POA: Diagnosis not present

## 2018-06-05 DIAGNOSIS — I872 Venous insufficiency (chronic) (peripheral): Secondary | ICD-10-CM | POA: Diagnosis not present

## 2018-06-05 DIAGNOSIS — W19XXXD Unspecified fall, subsequent encounter: Secondary | ICD-10-CM | POA: Diagnosis not present

## 2018-06-05 DIAGNOSIS — Z89422 Acquired absence of other left toe(s): Secondary | ICD-10-CM | POA: Diagnosis not present

## 2018-06-05 DIAGNOSIS — G4700 Insomnia, unspecified: Secondary | ICD-10-CM | POA: Diagnosis not present

## 2018-06-05 DIAGNOSIS — Z85828 Personal history of other malignant neoplasm of skin: Secondary | ICD-10-CM | POA: Diagnosis not present

## 2018-06-05 NOTE — Telephone Encounter (Signed)
Frankie from [facility] called needing verbal orders for personal hygiene assessment and equipment. Please call her back at [phone]. Thanks!

## 2018-06-05 NOTE — Telephone Encounter (Signed)
Not sure if this is something we would order. Please advise.

## 2018-06-06 NOTE — Telephone Encounter (Signed)
Called [facility], what they needed was an order for occupational therapy for assessment to help patient for strategies and possible equipment in the bathroom while his shoulder is injured. Order given.

## 2018-06-06 NOTE — Telephone Encounter (Signed)
ok 

## 2018-06-07 ENCOUNTER — Ambulatory Visit (INDEPENDENT_AMBULATORY_CARE_PROVIDER_SITE_OTHER): Payer: PPO | Admitting: Family Medicine

## 2018-06-07 ENCOUNTER — Encounter: Payer: Self-pay | Admitting: Family Medicine

## 2018-06-07 VITALS — BP 132/72 | HR 98 | Temp 97.7°F | Ht 67.0 in | Wt 243.0 lb

## 2018-06-07 DIAGNOSIS — I890 Lymphedema, not elsewhere classified: Secondary | ICD-10-CM

## 2018-06-07 NOTE — Patient Instructions (Signed)
It was very nice to see you today!  We will refer you to the lymphedema clinic.  Please continue using your compression devices. Try to keep your legs as elevated as possible. Please avoid salt.  Let me know if you change your mind about lasix.  Let me know if you have any signs of infection.   Take care, Dr Jerline Pain

## 2018-06-07 NOTE — Progress Notes (Signed)
   Subjective:  Nicholas [name] is a 72 y.o. male who presents today with a chief complaint of lymphedema.   HPI:  Lymphedema, chronic problem, worsening Symptoms have worsened over the past few weeks.  He recently fell and dislocated his shoulder.  Since then, he has not been able to sleep in bed and has had worsening lower extremity edema due to not being able to elevate his legs.  No fevers or chills.  He has had some weeping on the top of his feet.  No purulent discharge.  ROS: Per HPI  Objective:  Physical Exam: BP 132/72 (BP Location: Right Arm, Patient Position: Sitting, Cuff Size: Normal)   Pulse 98   Temp 97.7 F (36.5 C) (Oral)   Ht 5\' 7"  (1.702 m)   Wt 243 lb (110.2 kg)   SpO2 100%   BMI 38.06 kg/m   Wt Readings from Last 3 Encounters:  06/07/18 243 lb (110.2 kg)  05/14/18 236 lb (107 kg)  01/08/18 236 lb (107 kg)  Gen: NAD, resting comfortably CV: RRR with no murmurs appreciated Pulm: NWOB, CTAB with no crackles, wheezes, or rhonchi MSK: Bilateral lower extremities with pitting edema bilaterally.  Small amount of erythema on the anterior lower legs bilaterally.  No areas of open skin or other lesions.  Assessment/Plan:  Lymphedema No signs of volume overload.  Will place referral to lymphedema clinic per patient request.  Recommended short course of Lasix given that he has gained about 7 pounds in the last 3 weeks, however patient declined.  Discussed other conservative measures including elevation, compression, and avoidance of salt.  Discussed reasons to return to care.  Follow-up as needed.  Algis Greenhouse. Jerline Pain, MD 06/07/2018 11:21 AM

## 2018-06-11 DIAGNOSIS — S43005D Unspecified dislocation of left shoulder joint, subsequent encounter: Secondary | ICD-10-CM | POA: Diagnosis not present

## 2018-06-19 ENCOUNTER — Ambulatory Visit (INDEPENDENT_AMBULATORY_CARE_PROVIDER_SITE_OTHER): Payer: PPO | Admitting: Family Medicine

## 2018-06-19 ENCOUNTER — Encounter: Payer: Self-pay | Admitting: Family Medicine

## 2018-06-19 VITALS — BP 122/78 | HR 93 | Temp 97.6°F | Ht 67.0 in | Wt 256.2 lb

## 2018-06-19 DIAGNOSIS — E785 Hyperlipidemia, unspecified: Secondary | ICD-10-CM | POA: Diagnosis not present

## 2018-06-19 DIAGNOSIS — Z6841 Body Mass Index (BMI) 40.0 and over, adult: Secondary | ICD-10-CM | POA: Diagnosis not present

## 2018-06-19 DIAGNOSIS — D692 Other nonthrombocytopenic purpura: Secondary | ICD-10-CM | POA: Diagnosis not present

## 2018-06-19 DIAGNOSIS — R609 Edema, unspecified: Secondary | ICD-10-CM | POA: Diagnosis not present

## 2018-06-19 DIAGNOSIS — I890 Lymphedema, not elsewhere classified: Secondary | ICD-10-CM | POA: Diagnosis not present

## 2018-06-19 LAB — COMPREHENSIVE METABOLIC PANEL
ALT: 5 U/L (ref 0–53)
AST: 9 U/L (ref 0–37)
Albumin: 3.8 g/dL (ref 3.5–5.2)
Alkaline Phosphatase: 64 U/L (ref 39–117)
BUN: 23 mg/dL (ref 6–23)
CHLORIDE: 105 meq/L (ref 96–112)
CO2: 30 meq/L (ref 19–32)
CREATININE: 1.03 mg/dL (ref 0.40–1.50)
Calcium: 8.6 mg/dL (ref 8.4–10.5)
GFR: 75.4 mL/min (ref >60.00)
Glucose, Bld: 89 mg/dL (ref 70–99)
POTASSIUM: 4.4 meq/L (ref 3.5–5.1)
Sodium: 140 mEq/L (ref 135–145)
TOTAL PROTEIN: 5.7 g/dL — AB (ref 6.0–8.3)
Total Bilirubin: 0.5 mg/dL (ref 0.2–1.2)

## 2018-06-19 LAB — CBC
HEMATOCRIT: 41.4 % (ref 39.0–52.0)
HEMOGLOBIN: 13.7 g/dL (ref 13.0–17.0)
MCHC: 33 g/dL (ref 30.0–36.0)
MCV: 91.7 fl (ref 78.0–100.0)
PLATELETS: 156 10*3/uL (ref 150.0–400.0)
RBC: 4.52 Mil/uL (ref 4.22–5.81)
RDW: 14.6 % (ref 11.5–15.5)
WBC: 5.9 10*3/uL (ref 4.0–10.5)

## 2018-06-19 LAB — LIPID PANEL
Cholesterol: 88 mg/dL (ref 0–200)
HDL: 32.8 mg/dL — ABNORMAL LOW (ref >39.00)
LDL CALC: 31 mg/dL (ref 0–99)
NonHDL: 55.6
TRIGLYCERIDES: 121 mg/dL (ref 0.0–149.0)
Total CHOL/HDL Ratio: 3
VLDL: 24.2 mg/dL (ref 0.0–40.0)

## 2018-06-19 LAB — BRAIN NATRIURETIC PEPTIDE: PRO B NATRI PEPTIDE: 17 pg/mL (ref 0.0–100.0)

## 2018-06-19 LAB — TSH: TSH: 1.42 u[IU]/mL (ref 0.35–4.50)

## 2018-06-19 MED ORDER — FUROSEMIDE 20 MG PO TABS: 20.0000 mg | ORAL_TABLET | Freq: Every day | ORAL | 0 refills | Status: DC | PRN

## 2018-06-19 NOTE — Assessment & Plan Note (Signed)
S: Patient states still has easy bruising and bleeding- forearms have seem to be an issue in the past  A/P: Stable-continue to monitor

## 2018-06-19 NOTE — Assessment & Plan Note (Signed)
S: Very well controlled on Crestor 10 mg Lab Results  Component Value Date   CHOL 88 06/19/2018   HDL 32.80 (L) 06/19/2018   LDLCALC 31 06/19/2018   LDLDIRECT 50.0 05/03/2016   TRIG 121.0 06/19/2018   CHOLHDL 3 06/19/2018   A/P: LDL is at goal under 70- continue current medications

## 2018-06-19 NOTE — Patient Instructions (Addendum)
Please stop by lab before you go  As long as kidney function ok on labs I want you to take lasix 20mg  daily for 7 days- we will reach out about labs  1-2 weeks

## 2018-06-19 NOTE — Assessment & Plan Note (Signed)
S: Weight trending up-patient states has been eating poorly.  Since his injury he has also had decreased activity A/P: Worsening-we discussed tightening up diet and becoming active as able

## 2018-06-19 NOTE — Progress Notes (Signed)
Subjective:  Nicholas [name] is a 72 y.o. year old very pleasant male patient who presents for/with See problem oriented charting ROS-no fever, chills, nausea, vomiting.  Does have worsening leg swelling and weeping  Past Medical History-  Patient Active Problem List   Diagnosis Date Noted  . Lymphedema 02/10/2017    Priority: High  . 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 History of malignant melanoma. left leg 03/09/2009    Priority: High  . BPH associated with nocturia 05/16/2016    Priority: Medium  . OSA (obstructive sleep apnea) 12/28/2015    Priority: Medium  . Diastolic dysfunction 73/41/9379    Priority: Medium  . Chronic venous insufficiency 02/20/2014    Priority: Medium  . Hyperglycemia 08/02/2012    Priority: Medium  . Hyperlipidemia 12/13/2006    Priority: Medium  . Essential hypertension 12/13/2006    Priority: Medium  . Senile purpura ([place]) 05/03/2016    Priority: Low  . Hypersomnia 11/15/2015    Priority: Low  . Former smoker 04/29/2014    Priority: Low  . Obesity 07/13/2010    Priority: Low  . Insomnia 10/03/2007    Priority: Low  . Strain of right biceps 09/12/2017  . Cough 11/15/2015  . Dizziness 12/30/2014    Medications- reviewed and updated Current Outpatient Medications  Medication Sig Dispense Refill  . carbidopa-levodopa (SINEMET IR) 25-100 MG tablet 2 tablets at 7am/11am/3pm and 1 tablet at 7pm 630 tablet 1  . diphenhydrAMINE (BENADRYL) 25 mg capsule Take 25 mg by mouth at bedtime.    . fesoterodine (TOVIAZ) 8 MG TB24 tablet Take 8 mg by mouth daily.    Marland Kitchen ibuprofen (ADVIL,MOTRIN) 200 MG tablet Take 400 mg by mouth every 6 (six) hours as needed for moderate pain.    . rosuvastatin (CRESTOR) 10 MG tablet TAKE 1 TABLET ONCE DAILY. 30 tablet 5  . furosemide (LASIX) 20 MG tablet Take 1 tablet (20 mg total) by mouth daily as needed. 30 tablet 0   No current facility-administered medications for this visit.      Objective: BP 122/78 (BP Location: Left Arm, Patient Position: Sitting, Cuff Size: Large)   Pulse 93   Temp 97.6 F (36.4 C) (Oral)   Ht 5\' 7"  (1.702 m)   Wt 256 lb 3.2 oz (116.2 kg)   SpO2 95%   BMI 40.13 kg/m  Gen: NAD, resting comfortably CV: RRR  Lungs: nonlabored, normal respiratory rate Abdomen: soft/nondistended. Obese MSK- left arm in sling given history of dislocation Ext: pitting 3+ edema in bilateral lower legs-significant venous stasis changes with erythema bilaterally and some crusting- patient is weeping clear fluid in several of these areas Skin: warm, dry  Assessment/Plan: Lymphedema S: Patient seen 2 weeks ago by Dr. Jerline Pain for lymphedema- he was advised a short course of Lasix as he gained 7 pounds over the last 3 weeks but patient declined.  They also discussed elevation, compression and avoidance of salt.  He was referred to lymphedema clinic  He does have an upcoming visit with lymphedema clinic.  He has not been able to elevate his legs due to shoulder dislocation and needing to sit up for sleep for this.  Also he finds his legs too heavy to lift significantly- wife only able to help so much.  Weight is trending up-we discussed possibility of heart failure.  He states his wife has been cooking to keep her happy given recent issues and that he simply has been overeating  A/P: We will check labs to evaluate for any additional causes of swelling.  Most likely this is just due to lymphedema and patient not able to elevate the legs in comparison to heart like he normally would due to shoulder dislocation and sitting up to sleep. - That he has lymphedema follow-up - Has potential to develop cellulitis-we discussed warning signs - Lab work largely unrevealing of additional causes of edema - He does agree to a 7-day course of Lasix-then we can reevaluate.  BNP was not elevated on labs so doubt heart failure.  Consider bmet upon follow-up  Senile purpura ([place]) S:  Patient states still has easy bruising and bleeding- forearms have seem to be an issue in the past  A/P: Stable-continue to monitor  Hyperlipidemia S: Very well controlled on Crestor 10 mg Lab Results  Component Value Date   CHOL 88 06/19/2018   HDL 32.80 (L) 06/19/2018   LDLCALC 31 06/19/2018   LDLDIRECT 50.0 05/03/2016   TRIG 121.0 06/19/2018   CHOLHDL 3 06/19/2018   A/P: LDL is at goal under 70- continue current medications  Morbid obesity ([place]) S: Weight trending up-patient states has been eating poorly.  Since his injury he has also had decreased activity A/P: Worsening-we discussed tightening up diet and becoming active as able   Future Appointments  Date Time Provider [place]  07/02/2018  2:30 PM Kipp Laurence, PT [facility] None  07/08/2018  3:45 PM [name], MD [facility]  10/02/2018 10:15 AM [name], PT [facility]  10/02/2018 11:00 AM Rine, Selmer Dominion, OT [facility]  10/02/2018 11:45 AM Valentino Saxon, [place], CCC-SLP [facility]  10/25/2018  3:00 PM [name], [name], DO [facility] None    Lab/Order associations: Hyperlipidemia, unspecified hyperlipidemia type - Plan: CBC, Comprehensive metabolic panel, Lipid panel, TSH  Senile purpura ([place]), Chronic  BMI 40.0-44.9, adult (HCC)  Morbid obesity (HCC)  Edema, unspecified type - Plan: B Nat Peptide  Lymphedema  Meds ordered this encounter  Medications  . furosemide (LASIX) 20 MG tablet    Sig: Take 1 tablet (20 mg total) by mouth daily as needed.    Dispense:  30 tablet    Refill:  0    Return precautions advised.  [name], MD

## 2018-06-19 NOTE — Assessment & Plan Note (Signed)
S: Patient seen 2 weeks ago by Dr. Jerline Pain for lymphedema- he was advised a short course of Lasix as he gained 7 pounds over the last 3 weeks but patient declined.  They also discussed elevation, compression and avoidance of salt.  He was referred to lymphedema clinic  He does have an upcoming visit with lymphedema clinic.  He has not been able to elevate his legs due to shoulder dislocation and needing to sit up for sleep for this.  Also he finds his legs too heavy to lift significantly- wife only able to help so much.  Weight is trending up-we discussed possibility of heart failure.  He states his wife has been cooking to keep her happy given recent issues and that he simply has been overeating A/P: We will check labs to evaluate for any additional causes of swelling.  Most likely this is just due to lymphedema and patient not able to elevate the legs in comparison to heart like he normally would due to shoulder dislocation and sitting up to sleep. - That he has lymphedema follow-up - Has potential to develop cellulitis-we discussed warning signs - Lab work largely unrevealing of additional causes of edema - He does agree to a 7-day course of Lasix-then we can reevaluate.  BNP was not elevated on labs so doubt heart failure.  Consider bmet upon follow-up

## 2018-06-27 DIAGNOSIS — S43005D Unspecified dislocation of left shoulder joint, subsequent encounter: Secondary | ICD-10-CM | POA: Diagnosis not present

## 2018-07-02 ENCOUNTER — Ambulatory Visit: Payer: PPO | Attending: Family Medicine | Admitting: Physical Therapy

## 2018-07-02 DIAGNOSIS — I890 Lymphedema, not elsewhere classified: Secondary | ICD-10-CM | POA: Diagnosis not present

## 2018-07-02 DIAGNOSIS — M6281 Muscle weakness (generalized): Secondary | ICD-10-CM | POA: Insufficient documentation

## 2018-07-02 NOTE — Therapy (Addendum)
[place], [place], [address] Phone: [phone]   Fax:  [phone]  Physical Therapy Evaluation  Patient Details  Name: Nicholas [name] MRN: [id number] Date of Birth: 24-Feb-1946 Referring Provider (PT): Dr, Dimas Chyle   Encounter Date: 07/02/2018  PT End of Session - 07/02/18 2054    Visit Number  --   1 for lymphedema    Number of Visits  --   15 for lymphedema   Date for PT Re-Evaluation  --   09/12/2018 for lymphedema    PT Start Time  1430    PT Stop Time  1600    PT Time Calculation (min)  90 min    Activity Tolerance  Patient tolerated treatment well    Behavior During Therapy  [facility] for tasks assessed/performed       Past Medical History:  Diagnosis Date  . Cancer [facility]) Jan 2008   skin; hx of melanoma left foot/ amputation of toes 1&2   . Hyperlipidemia   . Hypertension   . Lymphedema     Past Surgical History:  Procedure Laterality Date  . 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 removal of melanoma of left foot/amputation of toes 1&25 Jul 2006  . WRIST FRACTURE SURGERY     72 years old-set    There were no vitals filed for this visit.   Subjective Assessment - 07/02/18 1442    Subjective  "Everything was going fine until I fell and broke my shoulder"  He said he fell on October 22 and had disclocated left shoulder with hairline fracture and torn ligament. He was not able to go to bed and so he slept in a chair and the lymphedema got worse.  He was doing well  with lymphedema before then. He has velcro garments that he is still wearing at night . And he is still doing a compression pump twice a day. He is no able to wear his stockings.  He is still having problems with his legs leaking.          [facility] PT Assessment - 07/02/18 0001      Assessment   Medical Diagnosis  bilateral leg lymphedema after melanoma treatment    Referring Provider (PT)  Dr, Dimas Chyle    Onset  Date/Surgical Date  07/24/06   approximate   Hand Dominance  Right      Precautions   Precautions  Fall      Balance Screen   Has the patient fallen in the past 6 months  Yes    How many times?  3    Has the patient had a decrease in activity level because of a fear of falling?   No    Is the patient reluctant to leave their home because of a fear of falling?   No      Home Environment   Living Environment  Private residence    Living Arrangements  Spouse/significant other      Prior Function   Level of Independence  Independent with household mobility with device;Requires assistive device for independence;Needs assistance with ADLs   occasional assist from wife    Vocation  Part time employment   Scientist, forensic    Leisure  wants to get back to water exercise for parkinsons disease symptoms       Cognition   Overall Cognitive Status  History of cognitive impairments - at baseline  Observation/Other Assessments   Observations  Pt has lymphedema in both lower legs with red coloration to feet and open sores on both legs with copious clear drainage.  His is not wearing socks at this time. Both pant legs and shoes are wet from the drainage that is running down his legs.  He has dry scaly skin at other areas of skin on legs and is missing the central toes of left foot from surgery     Skin Integrity  as above     Other Surveys   Other Surveys   lymphedema life impact scale. 14.71     Coordination   Gross Motor Movements are Fluid and Coordinated  No   limited by bradykinesia and heaviness  from lympheeema      Posture/Postural Control   Posture/Postural Control  Postural limitations    Postural Limitations  Rounded Shoulders;Forward head        LYMPHEDEMA/ONCOLOGY QUESTIONNAIRE - 07/02/18 2032      Right Lower Extremity Lymphedema   At Midpatella/Popliteal Crease  47 cm    30 cm Proximal to Floor at Lateral Plantar Foot  53 cm    20 cm Proximal to Floor at Lateral  Plantar Foot  --   unable to measure because of open wound   10 cm Proximal to Floor at Lateral Malleoli  39 cm   at crease   5 cm Proximal to 1st MTP Joint  33 cm    Around Proximal Great Toe  9.5 cm      Left Lower Extremity Lymphedema   At Midpatella/Popliteal Crease  45.5 cm   crease   30 cm Proximal to Floor at Lateral Plantar Foot  53.5 cm    20 cm Proximal to Floor at Lateral Plantar Foot  48 cm    10 cm Proximal to Floor at Lateral Malleoli  34.5 cm   at crease   5 cm Proximal to 1st MTP Joint  31 cm    Around Proximal Great Toe  10 cm                Outpatient Rehab from 07[facility]02/[facility]  Lymphedema Life Impact Scale Total Score  14.71 %      Objective measurements completed on examination: See above findings.      [facility] Adult PT Treatment/Exercise - 07/02/18 0001      Manual Therapy   Manual Therapy  Compression Bandaging    Edema Management  gave extrra tg soft to wife and instructed to use incontinence pads to absorb drainage and hold in place with clean tg soft     Compression Bandaging  thick lotion to intact skin on right leg, allevyn to open areas followed by tg soft to hold inplace one elastomull to toes, one artiflex one 8 and one 10 short stretch bandage , same to other leg                PT Short Term Goals - 07/02/18 2049      Additional Short Term Goals   Additional Short Term Goals  Yes      PT SHORT TERM GOAL #6   Title  (fir lymphedema) Pt will have system in place for manage of wound drainage and promotion of wound healing     Time  4    Period  Weeks    Status  New        PT Long Term Goals - 07/02/18 2051  PT LONG TERM GOAL #6   Title  (for Lymphedema ) Pt ill have decrease in right and left leg circumference at 20 cm proximal to the floor by 3 cm     Time  8    Period  Weeks    Status  New      PT LONG TERM GOAL #7   Title  Pt will have compression garments to manage  his lymphedema at home     Time  [place] - 07/02/18 2038    Clinical Impression Statement  Pt comes to our clinic with exacerbation of bilateral lymphedema with open draining wounds.  He is at risk for cellulities and management of these wounds are out of our scope.  He would be best treated at the wound center.. Pt agreed and called for  appointment to go there next Friday Dec. 20. First priority is to control the drainage and his wife will be able to assist him by helping him place incontinence pads under tg soft until he can get to the wound center, He will need new day and nighttime compression garments and should have better Medicare coverage for them as he now has wounds. Would like to follow along with him and the wound care center for management of his lymphedema and will know more after his visit there.      History and Personal Factors relevant to plan of care:  recent fall with fractured shoulder. open draining wounds     Clinical Presentation  Evolving    Clinical Decision Making  Moderate    Rehab Potential  Good    Clinical Impairments Affecting Rehab Potential  history of parkinsons diseae with limitations in ablilty to put on compression garments     PT Frequency  3x / week   delayed start until wounds are healed    PT Duration  8 weeks    PT Treatment/Interventions  ADLs/Self Care Home Management;Therapeutic activities;Therapeutic exercise;Patient/family education;Orthotic Fit/Training;Manual techniques;Vasopneumatic Device;Compression bandaging;Manual lymph drainage;Functional mobility training;DME Instruction    PT Next Visit Plan  Check about wound care center appt, check to see if she is getting home health PT, reassess legs, eleation MLD and comression wrap. later, assist with getting comression garments,     Consulted and Agree with Plan of Care  Patient       Patient will benefit from skilled therapeutic intervention in  order to improve the following deficits and impairments:  Decreased skin integrity, Postural dysfunction, Obesity, Increased edema, Difficulty walking, Decreased mobility  Visit Diagnosis: Lymphedema, not elsewhere classified - Plan: PT plan of care cert/re-cert  Muscle weakness (generalized) - Plan: PT plan of care cert/re-cert     Problem List Patient Active Problem List   Diagnosis Date Noted  . Strain of right biceps 09/12/2017  . Lymphedema 02/10/2017  . BPH associated with nocturia 05/16/2016  . Senile purpura ([place]) 05/03/2016  . OSA (obstructive sleep apnea) 12/28/2015  . Hypersomnia 11/15/2015  . Cough 11/15/2015  . 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 Dizziness 12/30/2014  . Diastolic dysfunction 38/46/6599  . Former smoker 04/29/2014  . Chronic venous insufficiency 02/20/2014  . Hyperglycemia 08/02/2012  . Morbid obesity ([place]) 07/13/2010  . History of malignant melanoma. left leg 03/09/2009  . Insomnia 10/03/2007  . Hyperlipidemia 12/13/2006  . Essential hypertension 12/13/2006   Donato Heinz. Owens Shark, PT  [name] 07/02/2018, 8:58 PM  [place], [place], [address] Phone: [phone]   Fax:  [phone]  Name: AUGUSTINO SAVASTANO MRN: [id number] Date of Birth: 04-16-1946 PHYSICAL THERAPY DISCHARGE SUMMARY  Visits from Start of Care: 1  Current functional level related to goals / functional outcomes: Unknown as pt had follow up care at the wound center   Remaining deficits: unkonwn   Education / Equipment: To follow up at the wound center Plan: Patient agrees to discharge.  Patient goals were not met. Patient is being discharged due to not returning since the last visit.  ?????    Maudry Diego, PT 10/11/18 2:48 PM

## 2018-07-02 NOTE — Patient Instructions (Signed)

## 2018-07-03 ENCOUNTER — Other Ambulatory Visit: Payer: Self-pay

## 2018-07-03 DIAGNOSIS — I890 Lymphedema, not elsewhere classified: Secondary | ICD-10-CM

## 2018-07-04 ENCOUNTER — Encounter (HOSPITAL_BASED_OUTPATIENT_CLINIC_OR_DEPARTMENT_OTHER): Payer: Self-pay

## 2018-07-04 ENCOUNTER — Encounter (HOSPITAL_BASED_OUTPATIENT_CLINIC_OR_DEPARTMENT_OTHER): Payer: PPO | Attending: Internal Medicine

## 2018-07-04 DIAGNOSIS — Z85820 Personal history of malignant melanoma of skin: Secondary | ICD-10-CM | POA: Insufficient documentation

## 2018-07-04 DIAGNOSIS — L97212 Non-pressure chronic ulcer of right calf with fat layer exposed: Secondary | ICD-10-CM | POA: Diagnosis not present

## 2018-07-04 DIAGNOSIS — Z87891 Personal history of nicotine dependence: Secondary | ICD-10-CM | POA: Insufficient documentation

## 2018-07-04 DIAGNOSIS — G4733 Obstructive sleep apnea (adult) (pediatric): Secondary | ICD-10-CM | POA: Diagnosis not present

## 2018-07-04 DIAGNOSIS — I872 Venous insufficiency (chronic) (peripheral): Secondary | ICD-10-CM | POA: Diagnosis not present

## 2018-07-04 DIAGNOSIS — Z9221 Personal history of antineoplastic chemotherapy: Secondary | ICD-10-CM | POA: Insufficient documentation

## 2018-07-04 DIAGNOSIS — L97822 Non-pressure chronic ulcer of other part of left lower leg with fat layer exposed: Secondary | ICD-10-CM | POA: Diagnosis not present

## 2018-07-04 DIAGNOSIS — I890 Lymphedema, not elsewhere classified: Secondary | ICD-10-CM | POA: Diagnosis not present

## 2018-07-04 DIAGNOSIS — I10 Essential (primary) hypertension: Secondary | ICD-10-CM | POA: Insufficient documentation

## 2018-07-04 DIAGNOSIS — G20 Parkinson's disease: Secondary | ICD-10-CM | POA: Diagnosis not present

## 2018-07-04 DIAGNOSIS — B353 Tinea pedis: Secondary | ICD-10-CM | POA: Insufficient documentation

## 2018-07-04 DIAGNOSIS — L97812 Non-pressure chronic ulcer of other part of right lower leg with fat layer exposed: Secondary | ICD-10-CM | POA: Diagnosis not present

## 2018-07-08 ENCOUNTER — Ambulatory Visit: Payer: PPO | Admitting: Family Medicine

## 2018-07-10 ENCOUNTER — Ambulatory Visit: Payer: PPO | Admitting: Physical Therapy

## 2018-07-11 DIAGNOSIS — S81802A Unspecified open wound, left lower leg, initial encounter: Secondary | ICD-10-CM | POA: Diagnosis not present

## 2018-07-11 DIAGNOSIS — R21 Rash and other nonspecific skin eruption: Secondary | ICD-10-CM | POA: Diagnosis not present

## 2018-07-11 DIAGNOSIS — S81801A Unspecified open wound, right lower leg, initial encounter: Secondary | ICD-10-CM | POA: Diagnosis not present

## 2018-07-11 DIAGNOSIS — I890 Lymphedema, not elsewhere classified: Secondary | ICD-10-CM | POA: Diagnosis not present

## 2018-07-11 DIAGNOSIS — L97212 Non-pressure chronic ulcer of right calf with fat layer exposed: Secondary | ICD-10-CM | POA: Diagnosis not present

## 2018-07-15 ENCOUNTER — Encounter (HOSPITAL_BASED_OUTPATIENT_CLINIC_OR_DEPARTMENT_OTHER): Payer: PPO | Attending: Internal Medicine

## 2018-07-15 DIAGNOSIS — L97822 Non-pressure chronic ulcer of other part of left lower leg with fat layer exposed: Secondary | ICD-10-CM | POA: Insufficient documentation

## 2018-07-15 DIAGNOSIS — B353 Tinea pedis: Secondary | ICD-10-CM | POA: Diagnosis not present

## 2018-07-15 DIAGNOSIS — L97812 Non-pressure chronic ulcer of other part of right lower leg with fat layer exposed: Secondary | ICD-10-CM | POA: Insufficient documentation

## 2018-07-15 DIAGNOSIS — Z87891 Personal history of nicotine dependence: Secondary | ICD-10-CM | POA: Diagnosis not present

## 2018-07-15 DIAGNOSIS — I890 Lymphedema, not elsewhere classified: Secondary | ICD-10-CM | POA: Diagnosis not present

## 2018-07-15 DIAGNOSIS — I10 Essential (primary) hypertension: Secondary | ICD-10-CM | POA: Insufficient documentation

## 2018-07-15 DIAGNOSIS — Z9221 Personal history of antineoplastic chemotherapy: Secondary | ICD-10-CM | POA: Insufficient documentation

## 2018-07-15 DIAGNOSIS — Z89422 Acquired absence of other left toe(s): Secondary | ICD-10-CM | POA: Insufficient documentation

## 2018-07-15 DIAGNOSIS — G4733 Obstructive sleep apnea (adult) (pediatric): Secondary | ICD-10-CM | POA: Diagnosis not present

## 2018-07-15 DIAGNOSIS — G20 Parkinson's disease: Secondary | ICD-10-CM | POA: Diagnosis not present

## 2018-07-15 DIAGNOSIS — Z85820 Personal history of malignant melanoma of skin: Secondary | ICD-10-CM | POA: Insufficient documentation

## 2018-07-22 DIAGNOSIS — R21 Rash and other nonspecific skin eruption: Secondary | ICD-10-CM | POA: Diagnosis not present

## 2018-07-22 DIAGNOSIS — S81801A Unspecified open wound, right lower leg, initial encounter: Secondary | ICD-10-CM | POA: Diagnosis not present

## 2018-07-22 DIAGNOSIS — I890 Lymphedema, not elsewhere classified: Secondary | ICD-10-CM | POA: Diagnosis not present

## 2018-07-22 DIAGNOSIS — L97812 Non-pressure chronic ulcer of other part of right lower leg with fat layer exposed: Secondary | ICD-10-CM | POA: Diagnosis not present

## 2018-07-22 DIAGNOSIS — S81802A Unspecified open wound, left lower leg, initial encounter: Secondary | ICD-10-CM | POA: Diagnosis not present

## 2018-07-25 ENCOUNTER — Other Ambulatory Visit: Payer: Self-pay | Admitting: Neurology

## 2018-07-29 ENCOUNTER — Encounter (HOSPITAL_BASED_OUTPATIENT_CLINIC_OR_DEPARTMENT_OTHER): Payer: PPO | Attending: Internal Medicine

## 2018-07-29 DIAGNOSIS — I110 Hypertensive heart disease with heart failure: Secondary | ICD-10-CM | POA: Insufficient documentation

## 2018-07-29 DIAGNOSIS — I739 Peripheral vascular disease, unspecified: Secondary | ICD-10-CM | POA: Insufficient documentation

## 2018-07-29 DIAGNOSIS — I890 Lymphedema, not elsewhere classified: Secondary | ICD-10-CM | POA: Insufficient documentation

## 2018-07-29 DIAGNOSIS — I509 Heart failure, unspecified: Secondary | ICD-10-CM | POA: Insufficient documentation

## 2018-07-29 DIAGNOSIS — S81801A Unspecified open wound, right lower leg, initial encounter: Secondary | ICD-10-CM | POA: Diagnosis not present

## 2018-07-29 DIAGNOSIS — R0681 Apnea, not elsewhere classified: Secondary | ICD-10-CM | POA: Insufficient documentation

## 2018-07-29 DIAGNOSIS — Z9221 Personal history of antineoplastic chemotherapy: Secondary | ICD-10-CM | POA: Diagnosis not present

## 2018-07-29 DIAGNOSIS — Z09 Encounter for follow-up examination after completed treatment for conditions other than malignant neoplasm: Secondary | ICD-10-CM | POA: Diagnosis not present

## 2018-07-29 DIAGNOSIS — Z872 Personal history of diseases of the skin and subcutaneous tissue: Secondary | ICD-10-CM | POA: Insufficient documentation

## 2018-07-29 DIAGNOSIS — B353 Tinea pedis: Secondary | ICD-10-CM | POA: Diagnosis not present

## 2018-07-30 DIAGNOSIS — M6281 Muscle weakness (generalized): Secondary | ICD-10-CM | POA: Diagnosis not present

## 2018-07-30 DIAGNOSIS — M25512 Pain in left shoulder: Secondary | ICD-10-CM | POA: Diagnosis not present

## 2018-07-30 DIAGNOSIS — M25612 Stiffness of left shoulder, not elsewhere classified: Secondary | ICD-10-CM | POA: Diagnosis not present

## 2018-07-30 DIAGNOSIS — S43005D Unspecified dislocation of left shoulder joint, subsequent encounter: Secondary | ICD-10-CM | POA: Diagnosis not present

## 2018-08-05 ENCOUNTER — Other Ambulatory Visit: Payer: Self-pay | Admitting: Family Medicine

## 2018-08-07 DIAGNOSIS — M6281 Muscle weakness (generalized): Secondary | ICD-10-CM | POA: Diagnosis not present

## 2018-08-07 DIAGNOSIS — M25512 Pain in left shoulder: Secondary | ICD-10-CM | POA: Diagnosis not present

## 2018-08-07 DIAGNOSIS — M25612 Stiffness of left shoulder, not elsewhere classified: Secondary | ICD-10-CM | POA: Diagnosis not present

## 2018-08-07 DIAGNOSIS — S43005D Unspecified dislocation of left shoulder joint, subsequent encounter: Secondary | ICD-10-CM | POA: Diagnosis not present

## 2018-08-12 DIAGNOSIS — M6281 Muscle weakness (generalized): Secondary | ICD-10-CM | POA: Diagnosis not present

## 2018-08-12 DIAGNOSIS — S43005D Unspecified dislocation of left shoulder joint, subsequent encounter: Secondary | ICD-10-CM | POA: Diagnosis not present

## 2018-08-12 DIAGNOSIS — M25512 Pain in left shoulder: Secondary | ICD-10-CM | POA: Diagnosis not present

## 2018-08-12 DIAGNOSIS — M25612 Stiffness of left shoulder, not elsewhere classified: Secondary | ICD-10-CM | POA: Diagnosis not present

## 2018-08-14 DIAGNOSIS — M6281 Muscle weakness (generalized): Secondary | ICD-10-CM | POA: Diagnosis not present

## 2018-08-14 DIAGNOSIS — M25612 Stiffness of left shoulder, not elsewhere classified: Secondary | ICD-10-CM | POA: Diagnosis not present

## 2018-08-14 DIAGNOSIS — M25512 Pain in left shoulder: Secondary | ICD-10-CM | POA: Diagnosis not present

## 2018-08-14 DIAGNOSIS — S43005D Unspecified dislocation of left shoulder joint, subsequent encounter: Secondary | ICD-10-CM | POA: Diagnosis not present

## 2018-08-19 DIAGNOSIS — M25612 Stiffness of left shoulder, not elsewhere classified: Secondary | ICD-10-CM | POA: Diagnosis not present

## 2018-08-19 DIAGNOSIS — M6281 Muscle weakness (generalized): Secondary | ICD-10-CM | POA: Diagnosis not present

## 2018-08-19 DIAGNOSIS — M25512 Pain in left shoulder: Secondary | ICD-10-CM | POA: Diagnosis not present

## 2018-08-19 DIAGNOSIS — S43005D Unspecified dislocation of left shoulder joint, subsequent encounter: Secondary | ICD-10-CM | POA: Diagnosis not present

## 2018-08-21 DIAGNOSIS — S43005D Unspecified dislocation of left shoulder joint, subsequent encounter: Secondary | ICD-10-CM | POA: Diagnosis not present

## 2018-08-21 DIAGNOSIS — M6281 Muscle weakness (generalized): Secondary | ICD-10-CM | POA: Diagnosis not present

## 2018-08-21 DIAGNOSIS — M25512 Pain in left shoulder: Secondary | ICD-10-CM | POA: Diagnosis not present

## 2018-08-21 DIAGNOSIS — M25612 Stiffness of left shoulder, not elsewhere classified: Secondary | ICD-10-CM | POA: Diagnosis not present

## 2018-08-27 DIAGNOSIS — S43005D Unspecified dislocation of left shoulder joint, subsequent encounter: Secondary | ICD-10-CM | POA: Diagnosis not present

## 2018-08-27 DIAGNOSIS — M25612 Stiffness of left shoulder, not elsewhere classified: Secondary | ICD-10-CM | POA: Diagnosis not present

## 2018-08-27 DIAGNOSIS — M25512 Pain in left shoulder: Secondary | ICD-10-CM | POA: Diagnosis not present

## 2018-08-27 DIAGNOSIS — G4733 Obstructive sleep apnea (adult) (pediatric): Secondary | ICD-10-CM | POA: Diagnosis not present

## 2018-08-27 DIAGNOSIS — M6281 Muscle weakness (generalized): Secondary | ICD-10-CM | POA: Diagnosis not present

## 2018-09-02 DIAGNOSIS — S43005D Unspecified dislocation of left shoulder joint, subsequent encounter: Secondary | ICD-10-CM | POA: Diagnosis not present

## 2018-09-02 DIAGNOSIS — M25512 Pain in left shoulder: Secondary | ICD-10-CM | POA: Diagnosis not present

## 2018-09-02 DIAGNOSIS — M25612 Stiffness of left shoulder, not elsewhere classified: Secondary | ICD-10-CM | POA: Diagnosis not present

## 2018-09-02 DIAGNOSIS — M6281 Muscle weakness (generalized): Secondary | ICD-10-CM | POA: Diagnosis not present

## 2018-09-04 DIAGNOSIS — M6281 Muscle weakness (generalized): Secondary | ICD-10-CM | POA: Diagnosis not present

## 2018-09-04 DIAGNOSIS — M25512 Pain in left shoulder: Secondary | ICD-10-CM | POA: Diagnosis not present

## 2018-09-04 DIAGNOSIS — M25612 Stiffness of left shoulder, not elsewhere classified: Secondary | ICD-10-CM | POA: Diagnosis not present

## 2018-09-04 DIAGNOSIS — S43005D Unspecified dislocation of left shoulder joint, subsequent encounter: Secondary | ICD-10-CM | POA: Diagnosis not present

## 2018-09-09 DIAGNOSIS — S43005D Unspecified dislocation of left shoulder joint, subsequent encounter: Secondary | ICD-10-CM | POA: Diagnosis not present

## 2018-09-11 DIAGNOSIS — S43005D Unspecified dislocation of left shoulder joint, subsequent encounter: Secondary | ICD-10-CM | POA: Diagnosis not present

## 2018-09-11 DIAGNOSIS — M6281 Muscle weakness (generalized): Secondary | ICD-10-CM | POA: Diagnosis not present

## 2018-09-11 DIAGNOSIS — M25512 Pain in left shoulder: Secondary | ICD-10-CM | POA: Diagnosis not present

## 2018-09-11 DIAGNOSIS — M25612 Stiffness of left shoulder, not elsewhere classified: Secondary | ICD-10-CM | POA: Diagnosis not present

## 2018-09-16 DIAGNOSIS — M6281 Muscle weakness (generalized): Secondary | ICD-10-CM | POA: Diagnosis not present

## 2018-09-16 DIAGNOSIS — S43005D Unspecified dislocation of left shoulder joint, subsequent encounter: Secondary | ICD-10-CM | POA: Diagnosis not present

## 2018-09-16 DIAGNOSIS — M25512 Pain in left shoulder: Secondary | ICD-10-CM | POA: Diagnosis not present

## 2018-09-16 DIAGNOSIS — M25612 Stiffness of left shoulder, not elsewhere classified: Secondary | ICD-10-CM | POA: Diagnosis not present

## 2018-09-23 DIAGNOSIS — M25612 Stiffness of left shoulder, not elsewhere classified: Secondary | ICD-10-CM | POA: Diagnosis not present

## 2018-09-23 DIAGNOSIS — M25512 Pain in left shoulder: Secondary | ICD-10-CM | POA: Diagnosis not present

## 2018-09-23 DIAGNOSIS — S43005D Unspecified dislocation of left shoulder joint, subsequent encounter: Secondary | ICD-10-CM | POA: Diagnosis not present

## 2018-09-23 DIAGNOSIS — M6281 Muscle weakness (generalized): Secondary | ICD-10-CM | POA: Diagnosis not present

## 2018-09-30 DIAGNOSIS — S43005D Unspecified dislocation of left shoulder joint, subsequent encounter: Secondary | ICD-10-CM | POA: Diagnosis not present

## 2018-09-30 DIAGNOSIS — M6281 Muscle weakness (generalized): Secondary | ICD-10-CM | POA: Diagnosis not present

## 2018-09-30 DIAGNOSIS — M25612 Stiffness of left shoulder, not elsewhere classified: Secondary | ICD-10-CM | POA: Diagnosis not present

## 2018-09-30 DIAGNOSIS — M25512 Pain in left shoulder: Secondary | ICD-10-CM | POA: Diagnosis not present

## 2018-10-02 ENCOUNTER — Ambulatory Visit: Payer: PPO

## 2018-10-02 ENCOUNTER — Ambulatory Visit: Payer: PPO | Admitting: Physical Therapy

## 2018-10-02 ENCOUNTER — Ambulatory Visit: Payer: PPO | Admitting: Occupational Therapy

## 2018-10-24 NOTE — Progress Notes (Signed)
Virtual Visit via Video Note The purpose of this virtual visit is to provide medical care while limiting exposure to the novel coronavirus.    Consent was obtained for video visit:  Yes.   Answered questions that patient had about telehealth interaction:  Yes.   I discussed the limitations, risks, security and privacy concerns of performing an evaluation and management service by telemedicine. I also discussed with the patient that there may be a patient responsible charge related to this service. The patient expressed understanding and agreed to proceed.  Pt location: Home Physician Location: office Name of referring provider:  [name], MD I connected with Raoul Pitch Paule at patients initiation/request on 10/25/2018 at  2:00 PM EDT by video enabled telemedicine application and verified that I am speaking with the correct person using two identifiers. Pt MRN:  [id number] Pt DOB:  [birth year] Video Participants:  Dirk Dress;  wife   History of Present Illness:  Patient seen in follow-up for parkinsonism.  I increased his levodopa last visit to carbidopa/levodopa 25/100, 2 tablets 4 times per day.  He had physical therapy since our last visit.  States that shoulder is much better after PT.  Pt denies falls.  Pt has had some lightheadedness,  But no near syncope. He gets some lightheadedness if he misses a dose of levodopa and tries to make it up too close to another scheduled dose.   No hallucinations.  Mood has been good.   Records are reviewed from his primary care visit in November.   Observations/Objective:   Vitals:   10/25/18 1334  BP: (!) 143/85  Pulse: 96  Weight: 235 lb (106.6 kg)  Height: 5\' 7"  (1.702 m)    GEN:  The patient appears stated age and is in NAD.  Neurological examination:  Orientation: The patient is alert and oriented x3. Cranial nerves: There is good facial symmetry. There is facial hypomimia.  The speech is fluent and clear. Soft palate rises  symmetrically and there is no tongue deviation. Hearing is intact to conversational tone. Motor: Strength is at least antigravity x 4.   Shoulder shrug is equal and symmetric.  There is no pronator drift.  Movement examination: Tone: unable Abnormal movements: None Coordination:  There is mild decremation with RAM's, with finger taps bilaterally.  Could not see the feet good enough to evaluate them Gait and Station: The patient pushes off of his desk to arise and has trouble getting up.  He is in a very small space in his office and there is very little room to walk and it is somewhat cluttered.  He nearly falls and is able to catch himself on the desk right after he turns.  He is very short stepped.   Lab Results  Component Value Date   TSH 1.42 06/19/2018     Chemistry      Component Value Date/Time   NA 140 06/19/2018 1134   K 4.4 06/19/2018 1134   CL 105 06/19/2018 1134   CO2 30 06/19/2018 1134   BUN 23 06/19/2018 1134   CREATININE 1.03 06/19/2018 1134      Component Value Date/Time   CALCIUM 8.6 06/19/2018 1134   ALKPHOS 64 06/19/2018 1134   AST 9 06/19/2018 1134   ALT 5 06/19/2018 1134   BILITOT 0.5 06/19/2018 1134        Assessment and Plan:   1.  Idiopathic Parkinson's disease but I do think that PSP could be on the Ddx  as he does have a fixed face stare/eyes.               -We discussed the diagnosis as well as pathophysiology of the disease.  We discussed treatment options as well as prognostic indicators.  Patient education was provided.             -Continue carbidopa/levodopa 25/100, 2 tablets 4 times per day.  I refilled this and sent it to the pharmacy.             -Discussed in detail Covid-19 and risk factors for this, including age and PD.  Discussed importance of social distancing.  Discussed importance of staying home at all times, as is feasible.  Discussed taking advantage of grocery store hours for the elderly.  Pt expressed understanding. 2.  History of  melanoma             -next dermatology in May 3.  Vasomotor rhinorrhea             -mild and he is not ready for meds 4.  HTN             -now off meds and feels that he is doing well. Following with Dr. Yong Channel and his notes reviewed. 5.  Urinary incontinence             -having continued trouble.  Encouraged him to follow back up with urology.  He had side effects with Myrbetriq.  Follow Up Instructions:    -I discussed the assessment and treatment plan with the patient. The patient was provided an opportunity to ask questions and all were answered. The patient agreed with the plan and demonstrated an understanding of the instructions.   The patient was advised to call back or seek an in-person evaluation if the symptoms worsen or if the condition fails to improve as anticipated.    Total Time spent in visit with the patient was:  15 min, of which more than 50% of the time was spent in counseling and/or coordinating care on safety.   Pt understands and agrees with the plan of care outlined.     Alonza Bogus, DO

## 2018-10-25 ENCOUNTER — Telehealth (INDEPENDENT_AMBULATORY_CARE_PROVIDER_SITE_OTHER): Payer: PPO | Admitting: Neurology

## 2018-10-25 ENCOUNTER — Encounter: Payer: Self-pay | Admitting: Neurology

## 2018-10-25 ENCOUNTER — Other Ambulatory Visit: Payer: Self-pay

## 2018-10-25 ENCOUNTER — Ambulatory Visit: Payer: PPO | Admitting: Neurology

## 2018-10-25 DIAGNOSIS — G20 Parkinson's disease: Secondary | ICD-10-CM

## 2018-10-25 MED ORDER — CARBIDOPA-LEVODOPA 25-100 MG PO TABS: ORAL_TABLET | ORAL | 1 refills | Status: DC

## 2018-11-26 ENCOUNTER — Telehealth: Payer: Self-pay | Admitting: Family Medicine

## 2018-11-26 DIAGNOSIS — I890 Lymphedema, not elsewhere classified: Secondary | ICD-10-CM

## 2018-11-26 NOTE — Telephone Encounter (Signed)
Message routed to PCP.

## 2018-11-26 NOTE — Telephone Encounter (Signed)
Okay for referral?

## 2018-11-26 NOTE — Telephone Encounter (Signed)
May refer to lymphedema clinic under lymphedema.

## 2018-11-26 NOTE — Telephone Encounter (Signed)
Referral placed. Pt notified.  

## 2018-11-26 NOTE — Telephone Encounter (Signed)
Copied from [place] [id number]. Topic: Referral - Request for Referral >> Nov 26, 2018 11:27 AM Yvette Rack wrote: Has patient seen PCP for this complaint? yes  *If NO, is insurance requiring patient see PCP for this issue before PCP can refer them? Referral for which specialty: Lymph clinic Preferred provider/office: Pt wife stated Dr.Hunter referred pt to the clinic previously but they are requesting a referral Reason for referral: Holding appt for Thursday 11/28/18 but needs referral

## 2018-11-28 ENCOUNTER — Encounter: Payer: Self-pay | Admitting: Rehabilitation

## 2018-11-28 ENCOUNTER — Telehealth: Payer: Self-pay

## 2018-11-28 ENCOUNTER — Other Ambulatory Visit: Payer: Self-pay

## 2018-11-28 ENCOUNTER — Ambulatory Visit: Payer: PPO | Attending: Family Medicine | Admitting: Rehabilitation

## 2018-11-28 DIAGNOSIS — R2689 Other abnormalities of gait and mobility: Secondary | ICD-10-CM | POA: Insufficient documentation

## 2018-11-28 DIAGNOSIS — M6281 Muscle weakness (generalized): Secondary | ICD-10-CM | POA: Diagnosis not present

## 2018-11-28 DIAGNOSIS — I890 Lymphedema, not elsewhere classified: Secondary | ICD-10-CM | POA: Diagnosis not present

## 2018-11-28 DIAGNOSIS — R2681 Unsteadiness on feet: Secondary | ICD-10-CM | POA: Insufficient documentation

## 2018-11-28 DIAGNOSIS — R471 Dysarthria and anarthria: Secondary | ICD-10-CM | POA: Insufficient documentation

## 2018-11-28 NOTE — Telephone Encounter (Signed)
-----   Message from [name], MD sent at 11/28/2018  9:23 AM EDT ----- Regarding: RE: Referral Sorry- I told my team to refer to lymphedema clinic and they told me that is where they were instructed to put the referral in for- I will ask them to update for the correct order.   My apologies,  Nicholas [name] ----- Message ----- From: Toribio Harbour, PT Sent: 11/28/2018   8:24 AM EDT To: [name], MD, [name], PT Subject: Referral                                       Nicholas [name], we are scheduled to see Nicholas [name] today at [place] to treat him for bilateral LE lymphedema. His referral looks like it is for the wound clinic. Did you want him seen at the wound clinic instead? Does he have wounds or just lymphedema? If he has wounds, we do not treat that.  If he should see [place] for lymphedema, can you revise the referral to indicate "PT eval and treat for bilateral leg lymphedema"? Thanks for your help, Nicholas [name], PT

## 2018-11-28 NOTE — Addendum Note (Signed)
Addended by: Gwenyth Ober R on: 11/28/2018 09:34 AM   Modules accepted: Orders

## 2018-11-28 NOTE — Telephone Encounter (Signed)
This has been taking care of.

## 2018-11-28 NOTE — Therapy (Signed)
[place], [place], [address] Phone: [phone]   Fax:  [phone]  Physical Therapy Evaluation  Patient Details  Name: Nicholas [name] MRN: [id number] Date of Birth: 09/01/1945 Referring Provider (PT): Dr. [name]   Encounter Date: 11/28/2018  PT End of Session - 11/28/18 1708    Visit Number  1    Number of Visits  18    Date for PT Re-Evaluation  01/13/19    Authorization Type  [employer] (Medicare)-will need 10th visit progress note    PT Start Time  1440   pt having difficulty intiating movement to treatment room   PT Stop Time  1535    PT Time Calculation (min)  55 min    Activity Tolerance  Patient tolerated treatment well    Behavior During Therapy  [facility] for tasks assessed/performed       Past Medical History:  Diagnosis Date  . Cancer [facility]) Jan 2008   skin; hx of melanoma left foot/ amputation of toes 1&2   . Hyperlipidemia   . Hypertension   . Lymphedema     Past Surgical History:  Procedure Laterality Date  . 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 removal of melanoma of left foot/amputation of toes 1&25 Jul 2006  . WRIST FRACTURE SURGERY     73 years old-set    There were no vitals filed for this visit.   Subjective Assessment - 11/28/18 1445    Subjective  I need help putting these socks on.  I have alot of velcro wraps, stockings.  We are having a hard time with them.  Already has a medi butler. Wounds are now healed. Using the pump. I need to bandage the legs i think so the garments fit better.      Pertinent History  Lymphedema-has compression machine and uses stockings (wife helps); hx of PD/Parkinson's Plus Syndrone; malignant melanoma, hyperlipidemia, HTN, insomnia, obesity, hyperglycemia, dizziness, OSA, urinary urgency    Patient Stated Goals  get back in stockings    Currently in Pain?  No/denies         [facility] PT Assessment - 11/28/18 0001      Assessment   Medical Diagnosis  bilateral leg lymphedema after melanoma treatment    Referring Provider (PT)  Dr. [name]    Onset Date/Surgical Date  07/24/06    Hand Dominance  Right    Prior Therapy  yes, neuro and cancer rehab      Precautions   Precautions  Fall    Precaution Comments  lymphedema      Balance Screen   Has the patient fallen in the past 6 months  No    Has the patient had a decrease in activity level because of a fear of falling?   No    Is the patient reluctant to leave their home because of a fear of falling?   No      Home Social worker  Private residence    Living Arrangements  Spouse/significant other    Available Help at Discharge  Family    Type of [place] to enter    [place] - single point;[place] - 4 wheels      Prior Function   Level of Independence  Independent with household mobility with device;Requires assistive device for independence;Needs assistance with ADLs    Vocation  Part time employment    Leisure  parkinsons class at [employer] and land      Cognition   Overall Cognitive Status  History of cognitive impairments - at baseline      Observation/Other Assessments   Observations  pt has bil LE lymphedema and redness evident.  No longer open wounds present or drainage but a small red lesion on the anterior Rt upper shin.  Scaly and dry skin, toes removed from previous surgery      Coordination   Gross Motor Movements are Fluid and Coordinated  No      Posture/Postural Control   Posture/Postural Control  Postural limitations    Postural Limitations  Rounded Shoulders;Forward head        LYMPHEDEMA/ONCOLOGY QUESTIONNAIRE - 11/28/18 1705      Type   Cancer Type  melanoma between toes 2 and 3 of left foot       Surgeries   Other Surgery Date  07/24/06    Number Lymph Nodes Removed  5      Treatment   Past Chemotherapy Treatment  Yes      What other symptoms do  you have   Are you Having Heaviness or Tightness  Yes    Are you having Pain  No    Are you having pitting edema  Yes    Is it Hard or Difficult finding clothes that fit  Yes      Lymphedema Stage   Stage  STAGE 2 SPONTANEOUSLY IRREVERSIBLE      Right Lower Extremity Lymphedema   10 cm Proximal to Suprapatella  61 cm   in seated   At Midpatella/Popliteal Crease  57.5 cm   seated   30 cm Proximal to Floor at Lateral Plantar Foot  57 cm    20 cm Proximal to Floor at Lateral Plantar Foot  50.3 1    10  cm Proximal to Floor at Lateral Malleoli  41.5 cm    5 cm Proximal to 1st MTP Joint  29.8 cm    Across MTP Joint  28.9 cm    Around Proximal Great Toe  9.8 cm      Left Lower Extremity Lymphedema   10 cm Proximal to Suprapatella  58.7 cm   in seated   At Midpatella/Popliteal Crease  53 cm   in seated   30 cm Proximal to Floor at Lateral Plantar Foot  56.2 cm    20 cm Proximal to Floor at Lateral Plantar Foot  50.6 cm    10 cm Proximal to Floor at Lateral Malleoli  37.8 cm    5 cm Proximal to 1st MTP Joint  30.8 cm    Across MTP Joint  30 cm    Around Proximal Great Toe  9.5 cm             Outpatient [facility]  Lymphedema Life Impact Scale Total Score  14.71 %      Objective measurements completed on examination: See above findings.      [facility] Adult PT Treatment/Exercise - 11/28/18 0001      Manual Therapy   Edema Management  reviewed current stockings and garments helped pt donn circular knit stockings; unable to get exostrong on               PT Short Term Goals - 07/02/18 2049      Additional Short Term Goals   Additional Short Term Goals  Yes      PT SHORT TERM GOAL #6   Title  (fir lymphedema) Pt will have system in place for manage of wound drainage and promotion of wound healing     Time  4    Period  Weeks    Status  New        PT Long Term Goals - 11/28/18 1715      PT LONG  TERM GOAL #1   Title  Pt will decrease lymphedema volume in bilateral LEs to allow pt and wife to donn stockings at home     Time  6    Period  Weeks    Status  New             Plan - 11/28/18 1710    Clinical Impression Statement  Pt returns to lymphedema clinic now healed from open wounds on the LE.  Pt has a good selection of stockings, a pump, velcro garments, and a medi butler large size but is not able to donn his stocking by himself orhis wife.  Pt would like to continue with compression bandaging at this time to get back into his stockings.  Pt will difficulty initating movement into the clinic due to [name] but able to move around well once comfortable with his surroundings.  Did not bandage today will wait until able to do 2-3x per week.  It is also interested in doing both legs simultaneousl     Personal Factors and Comorbidities  Age;Time since onset of injury/illness/exacerbation    Stability/Clinical Decision Making  Stable/Uncomplicated    Clinical Decision Making  Low    Rehab Potential  Good    Clinical Impairments Affecting Rehab Potential  history of parkinsons diseae with limitations in ablilty to put on compression garments     PT Frequency  3x / week    PT Duration  6 weeks    PT Treatment/Interventions  ADLs/Self Care Home Management;Therapeutic activities;Therapeutic exercise;Patient/family education;Orthotic Fit/Training;Manual techniques;Vasopneumatic Device;Compression bandaging;Manual lymph drainage;Functional mobility training;DME Instruction    PT Next Visit Plan  begin bandaging bil LEs to the knee, one leg at a time if needed.  MLD. educate on easy slide/slippy gator/gloves for donning    Consulted and Agree with Plan of Care  Patient;Family member/caregiver       Patient will benefit from skilled therapeutic intervention in order to improve the following deficits and impairments:  Decreased skin integrity, Postural dysfunction, Obesity, Increased  edema, Difficulty walking, Decreased mobility  Visit Diagnosis: Lymphedema, not elsewhere classified  Other abnormalities of gait and mobility     Problem List Patient Active Problem List   Diagnosis Date Noted  . Strain of right biceps 09/12/2017  . Lymphedema 02/10/2017  . BPH associated with nocturia 05/16/2016  . Senile purpura ([place]) 05/03/2016  . OSA (obstructive sleep apnea) 12/28/2015  . Hypersomnia 11/15/2015  . Cough 11/15/2015  . 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 Dizziness 12/30/2014  . Diastolic dysfunction 40/98/1191  . Former smoker 04/29/2014  . Chronic venous insufficiency 02/20/2014  . Hyperglycemia 08/02/2012  . Morbid obesity ([place]) 07/13/2010  . History of malignant melanoma. left leg 03/09/2009  . Insomnia 10/03/2007  . Hyperlipidemia 12/13/2006  . Essential hypertension 12/13/2006    Shan Levans, PT 11/28/2018, 5:17 PM  [place], [place], [address] Phone: [phone]   Fax:  [phone]  Name: Nicholas [name] MRN: [id number] Date of Birth: 03-Aug-1945

## 2018-11-29 DIAGNOSIS — G4733 Obstructive sleep apnea (adult) (pediatric): Secondary | ICD-10-CM | POA: Diagnosis not present

## 2018-12-09 ENCOUNTER — Encounter: Payer: Self-pay | Admitting: Physical Therapy

## 2018-12-09 ENCOUNTER — Other Ambulatory Visit: Payer: Self-pay

## 2018-12-09 ENCOUNTER — Ambulatory Visit: Payer: PPO | Admitting: Physical Therapy

## 2018-12-09 DIAGNOSIS — R2689 Other abnormalities of gait and mobility: Secondary | ICD-10-CM

## 2018-12-09 DIAGNOSIS — I890 Lymphedema, not elsewhere classified: Secondary | ICD-10-CM

## 2018-12-09 DIAGNOSIS — M6281 Muscle weakness (generalized): Secondary | ICD-10-CM

## 2018-12-09 DIAGNOSIS — R2681 Unsteadiness on feet: Secondary | ICD-10-CM

## 2018-12-09 NOTE — Therapy (Addendum)
[place], [place], [address] Phone: [phone]   Fax:  [phone]  Physical Therapy Treatment  Patient Details  Name: Nicholas [name] MRN: [id number] Date of Birth: Oct 14, 1945 Referring Provider (PT): Dr. [name]   Encounter Date: 12/09/2018  PT End of Session - 12/09/18 1722    Visit Number  2    Number of Visits  18    Date for PT Re-Evaluation  01/13/19    Authorization Type  [employer] (Medicare)-will need 10th visit progress note    PT Start Time  1500    PT Stop Time  1545    PT Time Calculation (min)  45 min    Behavior During Therapy  [facility] for tasks assessed/performed       Past Medical History:  Diagnosis Date  . 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 Hyperlipidemia   . Hypertension   . Lymphedema     Past Surgical History:  Procedure Laterality Date  . 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 removal of melanoma of left foot/amputation of toes 1&25 Jul 2006  . WRIST FRACTURE SURGERY     73 years old-set    There were no vitals filed for this visit.  Subjective Assessment - 12/09/18 1713    Subjective  I'm here to get wrapped.   Pt reports he used his compression pump earlier today     Pertinent History  Lymphedema-has compression machine and uses stockings (wife helps); hx of PD/Parkinson's Plus Syndrone; malignant melanoma, hyperlipidemia, HTN, insomnia, obesity, hyperglycemia, dizziness, OSA, urinary urgency    Patient Stated Goals  get back in stockings and to put his his shoes on    Currently in Pain?  No/denies                  Outpatient [facility]  Lymphedema Life Impact Scale Total Score  14.71 %           [facility] Adult PT Treatment/Exercise - 12/09/18 0001      Manual Therapy   Edema Management  pt performed diaphragmatic breathing and  unilatral shoulder flexion with breathing for abdominal lymph stimulation   left shoulder elevation limited by recent shoulder frature    Compression Bandaging  thick lotion to right leg, then thick stockinette,2 elastomull with thin foram on top of toes and right foot. 2 artiflex,  1-8, 1-10 and 2-12 cm short stretch bandages form toes to knee  thick cream and tg soft to left leg with application of circaid reduction kit to left leg and foot.                   PT Short Term Goals - 07/02/18 2049      Additional Short Term Goals   Additional Short Term Goals  Yes      PT SHORT TERM GOAL #6   Title  (fir lymphedema) Pt will have system in place for manage of wound drainage and promotion of wound healing     Time  4    Period  Weeks    Status  New        PT Long Term Goals - 11/28/18 1715      PT LONG TERM GOAL #1   Title  Pt will decrease lymphedema volume in bilateral LEs to allow pt and wife to  donn stockings at home     Time  6    Period  Weeks    Status  New            Plan - 12/09/18 1722    Clinical Impression Statement  pt with severe edema in LE especially on top of both feet.  He tolerated bandagin to right leg and applicaion of velcro wrap on left.  Pt will need to get another assist to don his compression stockings as his foot is too big to use the [place] Factors and Comorbidities  Age;Time since onset of injury/illness/exacerbation    Rehab Potential  Good    Clinical Impairments Affecting Rehab Potential  history of parkinsons diseae with limitations in ablilty to put on compression garments     PT Frequency  3x / week    PT Duration  6 weeks    PT Treatment/Interventions  ADLs/Self Care Home Management;Therapeutic activities;Therapeutic exercise;Patient/family education;Orthotic Fit/Training;Manual techniques;Vasopneumatic Device;Compression bandaging;Manual lymph drainage;Functional mobility training;DME Instruction    PT Next Visit  Plan  Remeasue if he comes in with bandages on. cont bandaging/velcro wrap bil LEs to the knee, one leg at a time if needed.  MLD. educate on easy slide/slippy gator/gloves for donning    Consulted and Agree with Plan of Care  Patient       Patient will benefit from skilled therapeutic intervention in order to improve the following deficits and impairments:  Decreased skin integrity, Postural dysfunction, Obesity, Increased edema, Difficulty walking, Decreased mobility  Visit Diagnosis: Lymphedema, not elsewhere classified  Other abnormalities of gait and mobility  Unsteadiness on feet  Muscle weakness (generalized)     Problem List Patient Active Problem List   Diagnosis Date Noted  . Strain of right biceps 09/12/2017  . Lymphedema 02/10/2017  . BPH associated with nocturia 05/16/2016  . Senile purpura ([place]) 05/03/2016  . OSA (obstructive sleep apnea) 12/28/2015  . Hypersomnia 11/15/2015  . Cough 11/15/2015  . 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 Dizziness 12/30/2014  . Diastolic dysfunction 38/04/1750  . Former smoker 04/29/2014  . Chronic venous insufficiency 02/20/2014  . Hyperglycemia 08/02/2012  . Morbid obesity ([place]) 07/13/2010  . History of malignant melanoma. left leg 03/09/2009  . Insomnia 10/03/2007  . Hyperlipidemia 12/13/2006  . Essential hypertension 12/13/2006   Nicholas [name] PT  Nicholas [name] 12/09/2018, [place], [place], [address] Phone: [phone]   Fax:  [phone]  Name: Nicholas [name] MRN: [id number] Date of Birth: 07/10/1946

## 2018-12-11 ENCOUNTER — Ambulatory Visit: Payer: PPO | Admitting: Physical Therapy

## 2018-12-11 ENCOUNTER — Encounter: Payer: Self-pay | Admitting: Physical Therapy

## 2018-12-11 ENCOUNTER — Other Ambulatory Visit: Payer: Self-pay

## 2018-12-11 DIAGNOSIS — R2681 Unsteadiness on feet: Secondary | ICD-10-CM

## 2018-12-11 DIAGNOSIS — M6281 Muscle weakness (generalized): Secondary | ICD-10-CM

## 2018-12-11 DIAGNOSIS — R2689 Other abnormalities of gait and mobility: Secondary | ICD-10-CM

## 2018-12-11 DIAGNOSIS — I890 Lymphedema, not elsewhere classified: Secondary | ICD-10-CM

## 2018-12-11 NOTE — Therapy (Signed)
[place], [place], [address] Phone: [phone]   Fax:  [phone]  Physical Therapy Treatment  Patient Details  Name: Nicholas [name] MRN: [id number] Date of Birth: 1946-05-17 Referring Provider (PT): Dr. [name]   Encounter Date: 12/11/2018  PT End of Session - 12/11/18 1727    Visit Number  3    Number of Visits  18    Date for PT Re-Evaluation  01/13/19    Authorization Type  [employer] (Medicare)-will need 10th visit progress note    PT Start Time  1500    PT Stop Time  1550    PT Time Calculation (min)  50 min    Activity Tolerance  Patient tolerated treatment well       Past Medical History:  Diagnosis Date  . 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 Hyperlipidemia   . Hypertension   . Lymphedema     Past Surgical History:  Procedure Laterality Date  . 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 removal of melanoma of left foot/amputation of toes 1&25 Jul 2006  . WRIST FRACTURE SURGERY     73 years old-set    There were no vitals filed for this visit.  Subjective Assessment - 12/11/18 1724    Subjective  Pt comes in with bandages all wet from walking in the rain.  He kept the compression bandages on right leg til now, but took the Cricaid reduction kit off left leg last night     Pertinent History  Lymphedema-has compression machine and uses stockings (wife helps); hx of PD/Parkinson's Plus Syndrone; malignant melanoma, hyperlipidemia, HTN, insomnia, obesity, hyperglycemia, dizziness, OSA, urinary urgency    Patient Stated Goals  get back in stockings and to put his his shoes on    Currently in Pain?  No/denies            LYMPHEDEMA/ONCOLOGY QUESTIONNAIRE - 12/11/18 1509      Right Lower Extremity Lymphedema   30 cm Proximal to Floor at Lateral Plantar Foot  53 cm    20 cm Proximal to Floor at Lateral Plantar Foot  46._0 cm Proximal to Floor at Lateral Malleoli  38 cm   at crease   5 cm Proximal to 1st MTP Joint  30.5 cm    Across MTP Joint  28 cm    Around Proximal Great Toe  8.7 cm           [facility]  Lymphedema Life Impact Scale Total Score  14.71 %           [facility] Adult PT Treatment/Exercise - 12/11/18 0001      Manual Therapy   Manual Therapy  Edema management;Compression Bandaging    Edema Management  remeasured right leg     Compression Bandaging  thick lotion to right leg, then thick stockinette,2 elastomull with thin foram on top of toes and right foot.with thick foam at each malleoli  2 artiflex,  1-8, 1-10 and 2-12 cm short stretch bandages form toes to knee  thick cream and tg soft to left leg with application of farrow wrap (per pt request) to left leg and foot.                PT Short Term Goals - 07/02/18 2049  Additional Short Term Goals   Additional Short Term Goals  Yes      PT SHORT TERM GOAL #6   Title  (fir lymphedema) Pt will have system in place for manage of wound drainage and promotion of wound healing     Time  4    Period  Weeks    Status  New        PT Long Term Goals - 11/28/18 1715      PT LONG TERM GOAL #1   Title  Pt will decrease lymphedema volume in bilateral LEs to allow pt and wife to donn stockings at home     Time  6    Period  Weeks    Status  New            Plan - 12/11/18 1727    Clinical Impression Statement  Pt had good initial reduction with compression bandages  with softening of tissue as well on right leg.  left leg continues to be congested     Clinical Impairments Affecting Rehab Potential  history of parkinsons diseae with limitations in ablilty to put on compression garments     PT Frequency  3x / week    PT Duration  6 weeks    PT Next Visit Plan   cont bandaging to right leg /velcro wrap ro left  to the knee, until he can get in  stocking on right leg, then, bandaging to left leg  MLD. educate on easy slide/slippy gator/gloves for donning    Consulted and Agree with Plan of Care  Patient       Patient will benefit from skilled therapeutic intervention in order to improve the following deficits and impairments:  Decreased skin integrity, Postural dysfunction, Obesity, Increased edema, Difficulty walking, Decreased mobility  Visit Diagnosis: Lymphedema, not elsewhere classified  Other abnormalities of gait and mobility  Unsteadiness on feet  Muscle weakness (generalized)     Problem List Patient Active Problem List   Diagnosis Date Noted  . Strain of right biceps 09/12/2017  . Lymphedema 02/10/2017  . BPH associated with nocturia 05/16/2016  . Senile purpura ([place]) 05/03/2016  . OSA (obstructive sleep apnea) 12/28/2015  . Hypersomnia 11/15/2015  . Cough 11/15/2015  . 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 Dizziness 12/30/2014  . Diastolic dysfunction 17/61/6073  . Former smoker 04/29/2014  . Chronic venous insufficiency 02/20/2014  . Hyperglycemia 08/02/2012  . Morbid obesity ([place]) 07/13/2010  . History of malignant melanoma. left leg 03/09/2009  . Insomnia 10/03/2007  . Hyperlipidemia 12/13/2006  . Essential hypertension 12/13/2006   Nicholas [name] PT  Nicholas [name] 12/11/2018, 5:30 PM  [name], [place], [address] Phone: [phone]   Fax:  [phone]  Name: Nicholas [name] MRN: [id number] Date of Birth: [birth year]

## 2018-12-13 ENCOUNTER — Ambulatory Visit: Payer: PPO

## 2018-12-13 ENCOUNTER — Other Ambulatory Visit: Payer: Self-pay

## 2018-12-13 DIAGNOSIS — R2681 Unsteadiness on feet: Secondary | ICD-10-CM

## 2018-12-13 DIAGNOSIS — M6281 Muscle weakness (generalized): Secondary | ICD-10-CM

## 2018-12-13 DIAGNOSIS — R2689 Other abnormalities of gait and mobility: Secondary | ICD-10-CM

## 2018-12-13 DIAGNOSIS — I890 Lymphedema, not elsewhere classified: Secondary | ICD-10-CM

## 2018-12-13 NOTE — Therapy (Signed)
[place], [place], [address] Phone: [phone]   Fax:  [phone]  Physical Therapy Treatment  Patient Details  Name: Nicholas [name] MRN: [id number] Date of Birth: March 27, 1946 Referring Provider (PT): Dr. [name]   Encounter Date: 12/13/2018  PT End of Session - 12/13/18 1217    Visit Number  4    Number of Visits  18    Date for PT Re-Evaluation  01/13/19    Authorization Type  [employer] (Medicare)-will need 10th visit progress note    PT Start Time  1102    PT Stop Time  1155    PT Time Calculation (min)  53 min    Activity Tolerance  Patient tolerated treatment well    Behavior During Therapy  [facility] for tasks assessed/performed       Past Medical History:  Diagnosis Date  . 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 Hyperlipidemia   . Hypertension   . Lymphedema     Past Surgical History:  Procedure Laterality Date  . 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 removal of melanoma of left foot/amputation of toes 1&25 Jul 2006  . WRIST FRACTURE SURGERY     73 years old-set    There were no vitals filed for this visit.  Subjective Assessment - 12/13/18 1104    Subjective  Wearing the velcro on Lt and bandaging the Rt leg is going pretty well. My skin seems to be doing well where the sore was as well     Pertinent History  Lymphedema-has compression machine and uses stockings (wife helps); hx of PD/Parkinson's Plus Syndrone; malignant melanoma, hyperlipidemia, HTN, insomnia, obesity, hyperglycemia, dizziness, OSA, urinary urgency    Patient Stated Goals  get back in stockings and to put his his shoes on                  Outpatient Rehab from 07/02/2018 in [place]  Lymphedema Life Impact Scale Total Score  14.71 %           [facility] Adult PT Treatment/Exercise - 12/13/18 0001      Manual  Therapy   Manual Therapy  Manual Lymphatic Drainage (MLD);Compression Bandaging    Manual Lymphatic Drainage (MLD)  In Supine: Short neck, 5 diaphragmatic breaths, Rt axillary nodes, Rt inguino-axillary anastomosis, then Rt LE from lateral upper thigh to dorsum of foot working from proximal to distal then retracing all steps.     Compression Bandaging  thick lotion to right leg, then thick stockinette,1 elastomull to toes 1-3 with 1/4" gray foam to dorsal foot/toes, Artiflex x1 with thick foam at each malleoli and anterior aspect of ankle,  1-8, 1-10 (to foot) and 2-12 cm short stretch bandages (first done in herring bone pattern) from toes to knee. Lt LE: thick cream and tg soft to left leg with application of farrow wrap (per pt request) to left leg and circaid to foot.                PT Short Term Goals - 07/02/18 2049      Additional Short Term Goals   Additional Short Term Goals  Yes      PT SHORT TERM GOAL #6   Title  (fir lymphedema) Pt will have system in place for manage of wound drainage and promotion of wound healing  Time  4    Period  Weeks    Status  New        PT Long Term Goals - 11/28/18 1715      PT LONG TERM GOAL #1   Title  Pt will decrease lymphedema volume in bilateral LEs to allow pt and wife to donn stockings at home     Time  6    Period  Weeks    Status  New            Plan - 12/13/18 1217    Clinical Impression Statement  Pts skin continues to tolerate bandaging well with no openings noted on skin and he reports bandages being comfortable/not causing irritation. Was able to incorporate manual lymph drainage to Rt LE as time allowed today and then continued with compression bandaging. Pt to leave this bandage on until hopefully Monday if bandage stays intact, depending on amount of reductions of his LE. He is then to wash leg, assess skin and apply lotion, then don velcro garment. Pt verbalized understanding this. During session today pt was  making inappropriate sexual comments (last comment was while therapist was adjusting pts pants leg over bandages, as he can not do this for himself due to balance, he said he "likes to ask for his pants to be pulled down") towards therapist so at end of session advised pt that his comments are not appropriate and if he continues with these comments he will not be treated. Also advised Leone Payor, PT supervisor who agreed pt will not be seen if this continues. Pt verbalized understanding.    Personal Factors and Comorbidities  Age;Time since onset of injury/illness/exacerbation    Stability/Clinical Decision Making  Stable/Uncomplicated    Rehab Potential  Good    Clinical Impairments Affecting Rehab Potential  history of parkinsons diseae with limitations in ablilty to put on compression garments     PT Frequency  3x / week    PT Duration  6 weeks    PT Treatment/Interventions  ADLs/Self Care Home Management;Therapeutic activities;Therapeutic exercise;Patient/family education;Orthotic Fit/Training;Manual techniques;Vasopneumatic Device;Compression bandaging;Manual lymph drainage;Functional mobility training;DME Instruction    PT Next Visit Plan   cont bandaging to right leg /velcro wrap to left to the knee, until he can get in stocking on right leg, then, bandaging to left leg; cont MLD as time allows. educate on easy slide/slippy gator/gloves for donning. If pt conts with inappropriate comments will be D/C.    Consulted and Agree with Plan of Care  Patient       Patient will benefit from skilled therapeutic intervention in order to improve the following deficits and impairments:  Decreased skin integrity, Postural dysfunction, Obesity, Increased edema, Difficulty walking, Decreased mobility  Visit Diagnosis: Lymphedema, not elsewhere classified  Other abnormalities of gait and mobility  Unsteadiness on feet  Muscle weakness (generalized)     Problem List Patient Active Problem List    Diagnosis Date Noted  . Strain of right biceps 09/12/2017  . Lymphedema 02/10/2017  . BPH associated with nocturia 05/16/2016  . Senile purpura ([place]) 05/03/2016  . OSA (obstructive sleep apnea) 12/28/2015  . Hypersomnia 11/15/2015  . Cough 11/15/2015  . 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 Dizziness 12/30/2014  . Diastolic dysfunction 40/97/3532  . Former smoker 04/29/2014  . Chronic venous insufficiency 02/20/2014  . Hyperglycemia 08/02/2012  . Morbid obesity ([place]) 07/13/2010  . History of malignant melanoma. left leg 03/09/2009  . Insomnia 10/03/2007  . Hyperlipidemia 12/13/2006  .  Essential hypertension 12/13/2006    Otelia Limes, PTA 12/13/2018, 12:32 PM  [place], [place], [address] Phone: [phone]   Fax:  [phone]  Name: Nicholas [name] MRN: [id number] Date of Birth: [birth year]

## 2018-12-17 ENCOUNTER — Encounter: Payer: Self-pay | Admitting: Physical Therapy

## 2018-12-17 ENCOUNTER — Ambulatory Visit: Payer: PPO | Admitting: Physical Therapy

## 2018-12-17 ENCOUNTER — Other Ambulatory Visit: Payer: Self-pay

## 2018-12-17 DIAGNOSIS — I890 Lymphedema, not elsewhere classified: Secondary | ICD-10-CM | POA: Diagnosis not present

## 2018-12-17 DIAGNOSIS — R2681 Unsteadiness on feet: Secondary | ICD-10-CM

## 2018-12-17 DIAGNOSIS — R2689 Other abnormalities of gait and mobility: Secondary | ICD-10-CM

## 2018-12-17 DIAGNOSIS — M6281 Muscle weakness (generalized): Secondary | ICD-10-CM

## 2018-12-17 NOTE — Therapy (Signed)
[place], [place], [address] Phone: [phone]   Fax:  [phone]  Physical Therapy Treatment  Patient Details  Name: Nicholas [name] MRN: [id number] Date of Birth: 04-17-1946 Referring Provider (PT): Dr. [name]   Encounter Date: 12/17/2018  PT End of Session - 12/17/18 1715    Visit Number  5    Number of Visits  18    Date for PT Re-Evaluation  01/13/19    Authorization Type  [employer] (Medicare)-will need 10th visit progress note    PT Start Time  1400    PT Stop Time  1445    PT Time Calculation (min)  45 min    Activity Tolerance  Patient tolerated treatment well    Behavior During Therapy  [facility] for tasks assessed/performed       Past Medical History:  Diagnosis Date  . 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 Hyperlipidemia   . Hypertension   . Lymphedema     Past Surgical History:  Procedure Laterality Date  . 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 removal of melanoma of left foot/amputation of toes 1&25 Jul 2006  . WRIST FRACTURE SURGERY     73 years old-set    There were no vitals filed for this visit.  Subjective Assessment - 12/17/18 1713    Subjective  "The bandages slid down" Pt still has bandages on since Friday ( today is Tuesday)  He says he likes the [employer] wrap better than the [employer]     Pertinent History  Lymphedema-has compression machine and uses stockings (wife helps); hx of PD/Parkinson's Plus Syndrone; malignant melanoma, hyperlipidemia, HTN, insomnia, obesity, hyperglycemia, dizziness, OSA, urinary urgency    Patient Stated Goals  get back in stockings and to put his his shoes on    Currently in Pain?  No/denies            LYMPHEDEMA/ONCOLOGY QUESTIONNAIRE - 12/17/18 1409      Right Lower Extremity Lymphedema   10 cm Proximal to Suprapatella  61.5 cm   supine   At Midpatella/Popliteal Crease   47 cm   supine at knee crease    30 cm Proximal to Floor at Lateral Plantar Foot  47 cm    20 cm Proximal to Floor at Lateral Plantar Foot  41.'3 1    10 ' cm Proximal to Floor at Lateral Malleoli  35 cm    5 cm Proximal to 1st MTP Joint  28.9 cm    Across MTP Joint  25 cm    Around Proximal Great Toe  8.1 cm           Out[facility]  Lymphedema Life Impact Scale Total Score  14.71 %           [facility] Adult PT Treatment/Exercise - 12/17/18 0001      Manual Therapy   Manual Therapy  Edema management;Compression Bandaging    Edema Management  remeasured right leg     Compression Bandaging  thick lotion to right leg, then thick stockinette,2 elastomull with thin foram on top of toes and right foot.with thick foam at each malleoli and entire lower leg  2 artiflex,  1-8, 1-10 and 2-12 cm short stretch bandages form toes to knee  thick cream and tg soft to left leg with application of farrow  wrap (per pt request) to left leg and foot.                PT Short Term Goals - 07/02/18 2049      Additional Short Term Goals   Additional Short Term Goals  Yes      PT SHORT TERM GOAL #6   Title  (fir lymphedema) Pt will have system in place for manage of wound drainage and promotion of wound healing     Time  4    Period  Weeks    Status  New        PT Long Term Goals - 11/28/18 1715      PT LONG TERM GOAL #1   Title  Pt will decrease lymphedema volume in bilateral LEs to allow pt and wife to donn stockings at home     Time  [place] - 12/17/18 1716    Clinical Impression Statement  Pt is responding well to compression on right leg.  Upgraded foam today.  He may be ready for compression sock that he has and regular shoe next visit.     Personal Factors and Comorbidities  Age    PT Treatment/Interventions  ADLs/Self Care Home Management;Therapeutic  activities;Therapeutic exercise;Patient/family education;Orthotic Fit/Training;Manual techniques;Vasopneumatic Device;Compression bandaging;Manual lymph drainage;Functional mobility training;DME Instruction    PT Next Visit Plan   Check to see if pt can fit in his exostrong stockig and shoe next visit,  If so start on left left, if not, cont bandaging to right leg /velcro wrap to left to the knee, until he can get in stocking on right leg, then, bandaging to left leg; cont MLD as time allows. educate on easy slide/slippy gator/gloves for donning. If pt conts with inappropriate comments will be D/C.    Consulted and Agree with Plan of Care  Patient       Patient will benefit from skilled therapeutic intervention in order to improve the following deficits and impairments:  Decreased skin integrity, Postural dysfunction, Obesity, Increased edema, Difficulty walking, Decreased mobility  Visit Diagnosis: Lymphedema, not elsewhere classified  Other abnormalities of gait and mobility  Unsteadiness on feet  Muscle weakness (generalized)     Problem List Patient Active Problem List   Diagnosis Date Noted  . Strain of right biceps 09/12/2017  . Lymphedema 02/10/2017  . BPH associated with nocturia 05/16/2016  . Senile purpura ([place]) 05/03/2016  . OSA (obstructive sleep apnea) 12/28/2015  . Hypersomnia 11/15/2015  . Cough 11/15/2015  . 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 Dizziness 12/30/2014  . Diastolic dysfunction 00/45/9977  . Former smoker 04/29/2014  . Chronic venous insufficiency 02/20/2014  . Hyperglycemia 08/02/2012  . Morbid obesity ([place]) 07/13/2010  . History of malignant melanoma. left leg 03/09/2009  . Insomnia 10/03/2007  . Hyperlipidemia 12/13/2006  . Essential hypertension 12/13/2006   [name]  [name] 12/17/2018, 5:19 PM  [place], [place],  [address] Phone: [phone]   Fax:  [phone]  Name: Nicholas [name] MRN: [id number] Date of Birth: 09/11/1945

## 2018-12-19 ENCOUNTER — Other Ambulatory Visit: Payer: Self-pay

## 2018-12-19 ENCOUNTER — Ambulatory Visit: Payer: PPO | Admitting: Physical Therapy

## 2018-12-19 ENCOUNTER — Encounter: Payer: Self-pay | Admitting: Physical Therapy

## 2018-12-19 DIAGNOSIS — R2689 Other abnormalities of gait and mobility: Secondary | ICD-10-CM

## 2018-12-19 DIAGNOSIS — R471 Dysarthria and anarthria: Secondary | ICD-10-CM

## 2018-12-19 DIAGNOSIS — R2681 Unsteadiness on feet: Secondary | ICD-10-CM

## 2018-12-19 DIAGNOSIS — I890 Lymphedema, not elsewhere classified: Secondary | ICD-10-CM

## 2018-12-19 DIAGNOSIS — M6281 Muscle weakness (generalized): Secondary | ICD-10-CM

## 2018-12-19 NOTE — Therapy (Signed)
[place], [place], [address] Phone: [phone]   Fax:  [phone]  Physical Therapy Treatment  Patient Details  Name: Nicholas [name] MRN: [id number] Date of Birth: [birth year] Referring Provider (PT): Dr. [name]   Encounter Date: 12/19/2018  PT End of Session - 12/19/18 1721    Visit Number  6    Number of Visits  18    Date for PT Re-Evaluation  01/13/19    Authorization Type  [employer] (Medicare)-will need 10th visit progress note    PT Start Time  1500    PT Stop Time  1555    PT Time Calculation (min)  55 min    Activity Tolerance  Patient tolerated treatment well    Behavior During Therapy  [facility] for tasks assessed/performed       Past Medical History:  Diagnosis Date  . 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 Hyperlipidemia   . Hypertension   . Lymphedema     Past Surgical History:  Procedure Laterality Date  . 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 removal of melanoma of left foot/amputation of toes 1&25 Jul 2006  . WRIST FRACTURE SURGERY     73 years old-set    There were no vitals filed for this visit.  Subjective Assessment - 12/19/18 1716    Subjective  "I just got out of the pump"  Pt comes in with compression bandages on right leg and Farrow wrap on leg and foot on left leg.     Pertinent History  Lymphedema-has compression machine and uses stockings (wife helps); hx of PD/Parkinson's Plus Syndrone; malignant melanoma, hyperlipidemia, HTN, insomnia, obesity, hyperglycemia, dizziness, OSA, urinary urgency    Patient Stated Goals  get back in stockings and to put his his shoes on            LYMPHEDEMA/ONCOLOGY QUESTIONNAIRE - 12/19/18 1510      Right Lower Extremity Lymphedema   30 cm Proximal to Floor at Lateral Plantar Foot  45.8 cm    20 cm Proximal to Floor at Lateral Plantar Foot  40.5 1    10  cm Proximal to  Floor at Lateral Malleoli  34 cm    5 cm Proximal to 1st MTP Joint  26.2 cm    Across MTP Joint  25.2 cm    Around Proximal Great Toe  8.2 cm      Left Lower Extremity Lymphedema   30 cm Proximal to Floor at Lateral Plantar Foot  50.3 cm    20 cm Proximal to Floor at Lateral Plantar Foot  45 cm    10 cm Proximal to Floor at Lateral Malleoli  34 cm   at crease   5 cm Proximal to 1st MTP Joint  31 cm    Across MTP Joint  30.5 cm    Around Proximal Great Toe  9 cm           [facility]02/[facility]  Lymphedema Life Impact Scale Total Score  14.71 %           [facility] Adult PT Treatment/Exercise - 12/19/18 0001      Manual Therapy   Manual Therapy  Edema management;Compression Bandaging    Manual therapy comments  Contacted wound center and was told that they could not assist pt in getting garments covered  as he does not currently have an open wound.  Showed pt the Tribute night from compression guru for lower leg and her will consider purchasing it though it cost $312.    Edema Management  removed bandages from right leg and was able to apply his exostrong below knee stocking and shoe. Remeasured left left     Compression Bandaging  washed and dried left leg , applied lotion and thick stockinetter  Elastomull to great and 4th toe. thick foam to top of foot both malleoli, and lower leg with 2 artiflex, 4 short stretch bandages , blue paper shoe on wrap                PT Short Term Goals - 07/02/18 2049      Additional Short Term Goals   Additional Short Term Goals  Yes      PT SHORT TERM GOAL #6   Title  (fir lymphedema) Pt will have system in place for manage of wound drainage and promotion of wound healing     Time  4    Period  Weeks    Status  New        PT Long Term Goals - 11/28/18 1715      PT LONG TERM GOAL #1   Title  Pt will decrease lymphedema volume in bilateral LEs to allow pt and wife to  donn stockings at home     Time  6    Period  Weeks    Status  New            Plan - 12/19/18 1722    Clinical Impression Statement  Pt very pleased that he could get the stocking and shoe on right leg. We discussed using the farrow wraps on right leg as a nighttime gament and stocking in the day He knows he needs to keep compression on all the time or he will swell up.  Began wrapping left leg today although he had already had some reduction from inital measurements since he as been wearing the Farrow wrap on that leg. Hopefully he will purchase the tribute wrap for night.     Clinical Impairments Affecting Rehab Potential  history of parkinsons diseae with limitations in ablilty to put on compression garments     PT Treatment/Interventions  ADLs/Self Care Home Management;Therapeutic activities;Therapeutic exercise;Patient/family education;Orthotic Fit/Training;Manual techniques;Vasopneumatic Device;Compression bandaging;Manual lymph drainage;Functional mobility training;DME Instruction    PT Next Visit Plan   Check to see if pt still wearing  his exostrong stockig and shoe next visit, ask if he ordered the tribute wrap or if he needs help with ordering it.  Remeasure left leg and apply other exostrong if pt brings it in when he is ready , cont bandaging as needed cont MLD as time allows. educate on easy slide/slippy gator/gloves for donning. If pt conts with inappropriate comments will be D/C.    Consulted and Agree with Plan of Care  Patient       Patient will benefit from skilled therapeutic intervention in order to improve the following deficits and impairments:  Decreased skin integrity, Postural dysfunction, Obesity, Increased edema, Difficulty walking, Decreased mobility  Visit Diagnosis: Lymphedema, not elsewhere classified  Other abnormalities of gait and mobility  Unsteadiness on feet  Muscle weakness (generalized)  Dysarthria and anarthria     Problem List Patient  Active Problem List   Diagnosis Date Noted  . Strain of right biceps 09/12/2017  . Lymphedema 02/10/2017  . BPH associated  with nocturia 05/16/2016  . Senile purpura ([place]) 05/03/2016  . OSA (obstructive sleep apnea) 12/28/2015  . Hypersomnia 11/15/2015  . Cough 11/15/2015  . 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 Dizziness 12/30/2014  . Diastolic dysfunction 18/55/0158  . Former smoker 04/29/2014  . Chronic venous insufficiency 02/20/2014  . Hyperglycemia 08/02/2012  . Morbid obesity ([place]) 07/13/2010  . History of malignant melanoma. left leg 03/09/2009  . Insomnia 10/03/2007  . Hyperlipidemia 12/13/2006  . Essential hypertension 12/13/2006   [name]  [name] 12/19/2018, [place], [place], [address] Phone: [phone]   Fax:  [phone]  Name: TAYTON DECAIRE MRN: [id number] Date of Birth: 06-Dec-1945

## 2018-12-23 ENCOUNTER — Ambulatory Visit: Payer: PPO | Attending: Family Medicine | Admitting: Rehabilitation

## 2018-12-23 ENCOUNTER — Encounter: Payer: Self-pay | Admitting: Rehabilitation

## 2018-12-23 ENCOUNTER — Other Ambulatory Visit: Payer: Self-pay

## 2018-12-23 DIAGNOSIS — M6281 Muscle weakness (generalized): Secondary | ICD-10-CM | POA: Diagnosis not present

## 2018-12-23 DIAGNOSIS — R2681 Unsteadiness on feet: Secondary | ICD-10-CM | POA: Diagnosis not present

## 2018-12-23 DIAGNOSIS — R2689 Other abnormalities of gait and mobility: Secondary | ICD-10-CM | POA: Insufficient documentation

## 2018-12-23 DIAGNOSIS — I890 Lymphedema, not elsewhere classified: Secondary | ICD-10-CM | POA: Diagnosis not present

## 2018-12-23 NOTE — Therapy (Signed)
[facility] [address] [place], [place], [address] Phone: [phone]   Fax:  [phone]  Physical Therapy Treatment  Patient Details  Name: Nicholas [name] MRN: [id number] Date of Birth: 05/17/1946 Referring Provider (PT): Dr. Stephen Hunter   Encounter Date: 12/23/2018  PT End of Session - 12/23/18 1708    Visit Number  7    Number of Visits  18    Date for PT Re-Evaluation  01/13/19    Authorization Type  [employer] (Medicare)-will need 10th visit progress note    PT Start Time  1550    PT Stop Time  1635    PT Time Calculation (min)  45 min    Activity Tolerance  Patient tolerated treatment well    Behavior During Therapy  [facility] for tasks assessed/performed       Past Medical History:  Diagnosis Date  . 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 Hyperlipidemia   . Hypertension   . Lymphedema     Past Surgical History:  Procedure Laterality Date  . 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 removal of melanoma of left foot/amputation of toes 1&25 Jul 2006  . WRIST FRACTURE SURGERY     73 years old-set    There were no vitals filed for this visit.  Subjective Assessment - 12/23/18 1655    Subjective  pt arrives still wearing bandages from Thursday but most of them have fallen down to his foot.  Pt reports he also has been using the pump on top of the garments and has been sleeping in his exostrong even though he knows he is not supposed to do either of them. "I have a funeral on Wednesday so I can't come."     Pertinent History  Lymphedema-has compression machine and uses stockings (wife helps); hx of PD/Parkinson's Plus Syndrone; malignant melanoma, hyperlipidemia, HTN, insomnia, obesity, hyperglycemia, dizziness, OSA, urinary urgency    Patient Stated Goals  get back in stockings and to put his his shoes on    Currently in Pain?  No/denies                 [facility]  Lymphedema Life Impact Scale Total Score  14.71 %           [facility] Adult PT Treatment/Exercise - 12/23/18 0001      Manual Therapy   Edema Management  removed Lt LE bandages, washed Lt LE with gentle soap, applied lotion.  helped pt donn exostrong due to bandages being wet and was able to get it one with some help from the patient.     Manual Lymphatic Drainage (MLD)  In Supine leg on bolster Rt LE from lateral upper thigh to dorsum of foot working from proximal to distal then retracing all steps.                PT Short Term Goals - 07/02/18 2049      Additional Short Term Goals   Additional Short Term Goals  Yes      PT SHORT TERM GOAL #6   Title  (fir lymphedema) Pt will have system in place for manage of wound drainage and promotion of wound healing     Time  4    Period  Weeks    Status  New          PT Long Term Goals - 12/23/18 1711      PT LONG TERM GOAL #1   Title  Pt will decrease lymphedema volume in bilateral LEs to allow pt and wife to donn stockings at home     Status  Partially Met            Plan - 12/23/18 1709    Clinical Impression Statement  unable to wrap today as pts garments are all wet.  Was able to put on exostrong that pt has not been able to use for awhile so he was pleased.  Pt will be gone on Wednesday for a funeral but will use his pump and garments at home.      PT Next Visit Plan   show pt what size tribute to order, order an easy slide as well? Remeasure left leg and apply other exostrong if pt brings it in when he is ready , cont bandaging as needed cont MLD as time allows. educate on easy slide/slippy gator/gloves for donning. If pt conts with inappropriate comments will be D/C.    Consulted and Agree with Plan of Care  Patient       Patient will benefit from skilled therapeutic intervention in order to improve the following deficits and  impairments:  Decreased skin integrity, Postural dysfunction, Obesity, Increased edema, Difficulty walking, Decreased mobility  Visit Diagnosis: Lymphedema, not elsewhere classified  Other abnormalities of gait and mobility  Unsteadiness on feet     Problem List Patient Active Problem List   Diagnosis Date Noted  . Strain of right biceps 09/12/2017  . Lymphedema 02/10/2017  . BPH associated with nocturia 05/16/2016  . Senile purpura ([place]) 05/03/2016  . OSA (obstructive sleep apnea) 12/28/2015  . Hypersomnia 11/15/2015  . Cough 11/15/2015  . 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 Dizziness 12/30/2014  . Diastolic dysfunction 62/83/1517  . Former smoker 04/29/2014  . Chronic venous insufficiency 02/20/2014  . Hyperglycemia 08/02/2012  . Morbid obesity ([place]) 07/13/2010  . History of malignant melanoma. left leg 03/09/2009  . Insomnia 10/03/2007  . Hyperlipidemia 12/13/2006  . Essential hypertension 12/13/2006    Shan Levans, PT 12/23/2018, 5:12 PM  [place], [place], [address] Phone: [phone]   Fax:  [phone]  Name: Nicholas [name] MRN: [id number] Date of Birth: 1946-04-11

## 2018-12-25 ENCOUNTER — Encounter: Payer: PPO | Admitting: Rehabilitation

## 2018-12-27 ENCOUNTER — Ambulatory Visit: Payer: PPO | Admitting: Rehabilitation

## 2018-12-27 ENCOUNTER — Other Ambulatory Visit: Payer: Self-pay

## 2018-12-27 ENCOUNTER — Encounter: Payer: Self-pay | Admitting: Rehabilitation

## 2018-12-27 DIAGNOSIS — R2681 Unsteadiness on feet: Secondary | ICD-10-CM

## 2018-12-27 DIAGNOSIS — I890 Lymphedema, not elsewhere classified: Secondary | ICD-10-CM | POA: Diagnosis not present

## 2018-12-27 NOTE — Therapy (Signed)
[place], [place], [address] Phone: [phone]   Fax:  [phone]  Physical Therapy Treatment  Patient Details  Name: Nicholas [name] MRN: [id number] Date of Birth: July 24, 1946 Referring Provider (PT): Dr. [name]   Encounter Date: 12/27/2018  PT End of Session - 12/27/18 1210    Visit Number  8    Number of Visits  18    Date for PT Re-Evaluation  01/13/19    Authorization Type  [employer] (Medicare)-will need 10th visit progress note    PT Start Time  1100    PT Stop Time  1150    PT Time Calculation (min)  50 min    Activity Tolerance  Patient tolerated treatment well    Behavior During Therapy  [facility] for tasks assessed/performed       Past Medical History:  Diagnosis Date  . 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 Hyperlipidemia   . Hypertension   . Lymphedema     Past Surgical History:  Procedure Laterality Date  . 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 removal of melanoma of left foot/amputation of toes 1&25 Jul 2006  . WRIST FRACTURE SURGERY     73 years old-set    There were no vitals filed for this visit.  Subjective Assessment - 12/27/18 1100    Subjective  pt arrives wearing diabetic type socks on both feet.  made of a mesh type material was given to him by a friend.  educated pt that there are not good enough to control swelling.     Pertinent History  Lymphedema-has compression machine and uses stockings (wife helps); hx of PD/Parkinson's Plus Syndrone; malignant melanoma, hyperlipidemia, HTN, insomnia, obesity, hyperglycemia, dizziness, OSA, urinary urgency    Patient Stated Goals  get back in stockings and to put his his shoes on    Currently in Pain?  No/denies                  Outpatient Re[facility] from [facility]02/[facility]  Lymphedema Life Impact Scale Total Score  14.71 %            [facility] Adult PT Treatment/Exercise - 12/27/18 0001      Manual Therapy   Manual therapy comments  pt still not wanting to bandage but PT applied exostrong bilaterally and was able to get both shoes on as well.  Pt will bring medi butler next time to make sure it does not work     Edema Management  measured pt for LE off the shelf tribute which he is just above the maximal size by 1-2cm at both the calf and the ankle but pt will order one and see if we can make it work as he does not want to get a custom piece.  His wife was given information on how to order this today.      Manual Lymphatic Drainage (MLD)  In Supine legs on bolster; axillary nodes bil, axillo inguinal pathways and then Bil LE from lateral upper thigh to dorsum of foot working from proximal to distal then retracing all steps.                PT Short Term Goals - 07/02/18 2049      Additional Short Term Goals   Additional Short Term Goals  Yes  PT SHORT TERM GOAL #6   Title  (fir lymphedema) Pt will have system in place for manage of wound drainage and promotion of wound healing     Time  4    Period  Weeks    Status  New        PT Long Term Goals - 12/23/18 1711      PT LONG TERM GOAL #1   Title  Pt will decrease lymphedema volume in bilateral LEs to allow pt and wife to donn stockings at home     Status  Partially Met            Plan - 12/27/18 1210    Clinical Impression Statement  pt with some increase in measurements today bilaterally when measuring for tribute night garment.  Pt is just above the max size for a tribute off the shelf garment but wants to try this instead of getting a custom piece.  Discussed plan as pt has low compliance with his stockings and bandaging.  Pt agreeable to discharge when we can figure out a way for him to get on his exostrong garments with his wife and when we see if the OTS tribute night will work.  Pt will bring in his [place] to make sure  this does not work like he says.      PT Next Visit Plan   cont bandaging as needed cont MLD as time allows. educate on easy slide/slippy gator/gloves for donning. If pt conts with inappropriate comments will be D/C.    Consulted and Agree with Plan of Care  Patient;Family member/caregiver    Family Member Consulted  wife       Patient will benefit from skilled therapeutic intervention in order to improve the following deficits and impairments:     Visit Diagnosis: Lymphedema, not elsewhere classified  Unsteadiness on feet     Problem List Patient Active Problem List   Diagnosis Date Noted  . Strain of right biceps 09/12/2017  . Lymphedema 02/10/2017  . BPH associated with nocturia 05/16/2016  . Senile purpura ([place]) 05/03/2016  . OSA (obstructive sleep apnea) 12/28/2015  . Hypersomnia 11/15/2015  . Cough 11/15/2015  . 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 Dizziness 12/30/2014  . Diastolic dysfunction 88/05/314  . Former smoker 04/29/2014  . Chronic venous insufficiency 02/20/2014  . Hyperglycemia 08/02/2012  . Morbid obesity ([place]) 07/13/2010  . History of malignant melanoma. left leg 03/09/2009  . Insomnia 10/03/2007  . Hyperlipidemia 12/13/2006  . Essential hypertension 12/13/2006    [name] 12/27/2018, 12:14 PM  [place], [place], [address] Phone: [phone]   Fax:  [phone]  Name: Nicholas [name] MRN: [id number] Date of Birth: [birth year]

## 2018-12-30 ENCOUNTER — Ambulatory Visit: Payer: PPO | Admitting: Rehabilitation

## 2018-12-30 ENCOUNTER — Other Ambulatory Visit: Payer: Self-pay

## 2018-12-30 ENCOUNTER — Encounter: Payer: Self-pay | Admitting: Rehabilitation

## 2018-12-30 DIAGNOSIS — R2681 Unsteadiness on feet: Secondary | ICD-10-CM

## 2018-12-30 DIAGNOSIS — I890 Lymphedema, not elsewhere classified: Secondary | ICD-10-CM

## 2018-12-30 NOTE — Therapy (Signed)
[place], [place], [address] Phone: [phone]   Fax:  [phone]  Physical Therapy Treatment  Patient Details  Name: Nicholas [name] MRN: [id number] Date of Birth: [birth year] Referring Provider (PT): Dr. [name]   Encounter Date: 12/30/2018  PT End of Session - 12/30/18 1337    Visit Number  9    Number of Visits  18    Date for PT Re-Evaluation  01/13/19    Authorization Type  [employer] (Medicare)-will need 10th visit progress note    PT Start Time  1300    PT Stop Time  1330    PT Time Calculation (min)  30 min    Activity Tolerance  Patient tolerated treatment well    Behavior During Therapy  [facility] for tasks assessed/performed       Past Medical History:  Diagnosis Date  . 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 Hyperlipidemia   . Hypertension   . Lymphedema     Past Surgical History:  Procedure Laterality Date  . 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 removal of melanoma of left foot/amputation of toes 1&25 Jul 2006  . WRIST FRACTURE SURGERY     73 years old-set    There were no vitals filed for this visit.  Subjective Assessment - 12/30/18 1256    Subjective  pt arrives with no garments and barefeet. Reports he doesn't like to wear shoes after we were able to get them on after the last appt. "as much as I like you guys I don't want to wrap my legs anymore".  I was able to get my garments on with the butler.      Pertinent History  Lymphedema-has compression machine and uses stockings (wife helps); hx of PD/Parkinson's Plus Syndrone; malignant melanoma, hyperlipidemia, HTN, insomnia, obesity, hyperglycemia, dizziness, OSA, urinary urgency    Patient Stated Goals  get back in stockings and to put his his shoes on    Currently in Pain?  No/denies                  Outpatient Rehab from 07/02/2018 in [facility]  Lymphedema Life Impact Scale Total Score  14.71 %           [facility] Adult PT Treatment/Exercise - 12/30/18 0001      Manual Therapy   Edema Management  reviewed what to get online for continued treatment: still as not ordered the tribute, also recommended to order a larger size donner like the big butler or XL on amazon as the [employer] he has works but slides along the side of his calf as he uses it.  Pt aware that the larger size is needed.  Also given information on gloves for donning.                 PT Short Term Goals - 07/02/18 2049      Additional Short Term Goals   Additional Short Term Goals  Yes      PT SHORT TERM GOAL #6   Title  (fir lymphedema) Pt will have system in place for manage of wound drainage and promotion of wound healing     Time  4    Period  Weeks    Status  New        PT Long Term Goals - 12/30/18 1340  PT LONG TERM GOAL #1   Title  Pt will decrease lymphedema volume in bilateral LEs to allow pt and wife to donn stockings at home     Status  Partially Met            Plan - 12/30/18 1337    Clinical Impression Statement  Pt arrives not wanting to bandage any longer and reports he can get his garments on by himself.  Pt donned garments with PT minA for pulling them back up once he stepped into the donner.  Pt reports that his wife can do that last part.  his current donner slides slightly along the side of his calves upon stepping in so he was instructed to ordre the larger size.  Pt will return when his tribute arrives for size checking and to see if any wrapping is needed to wear this.      PT Frequency  3x / week    PT Duration  6 weeks    PT Treatment/Interventions  ADLs/Self Care Home Management;Therapeutic activities;Therapeutic exercise;Patient/family education;Orthotic Fit/Training;Manual techniques;Vasopneumatic Device;Compression bandaging;Manual lymph drainage;Functional mobility  training;DME Instruction    PT Next Visit Plan  remeasure LEs, get tribute/larger butler? assess fit wrap as needed to fit into tribute or get custom?        Patient will benefit from skilled therapeutic intervention in order to improve the following deficits and impairments:     Visit Diagnosis: Lymphedema, not elsewhere classified  Unsteadiness on feet     Problem List Patient Active Problem List   Diagnosis Date Noted  . Strain of right biceps 09/12/2017  . Lymphedema 02/10/2017  . BPH associated with nocturia 05/16/2016  . Senile purpura ([place]) 05/03/2016  . OSA (obstructive sleep apnea) 12/28/2015  . Hypersomnia 11/15/2015  . Cough 11/15/2015  . 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 Dizziness 12/30/2014  . Diastolic dysfunction 67/34/1937  . Former smoker 04/29/2014  . Chronic venous insufficiency 02/20/2014  . Hyperglycemia 08/02/2012  . Morbid obesity ([place]) 07/13/2010  . History of malignant melanoma. left leg 03/09/2009  . Insomnia 10/03/2007  . Hyperlipidemia 12/13/2006  . Essential hypertension 12/13/2006    Shan Levans, PT 12/30/2018, 1:41 PM  [place], [place], [address] Phone: [phone]   Fax:  [phone]  Name: AZAM GERVASI MRN: [id number] Date of Birth: 1945-10-16

## 2019-01-01 ENCOUNTER — Encounter: Payer: PPO | Admitting: Rehabilitation

## 2019-01-02 ENCOUNTER — Other Ambulatory Visit: Payer: Self-pay | Admitting: Family Medicine

## 2019-01-03 ENCOUNTER — Encounter: Payer: PPO | Admitting: Physical Therapy

## 2019-01-03 NOTE — Telephone Encounter (Signed)
Called pt and scheduled follow-up appointment for 01/07/19.

## 2019-01-03 NOTE — Telephone Encounter (Signed)
Last OV 06/19/18 Last refill 08/05/18 #30/5 Next OV not scheduled

## 2019-01-06 ENCOUNTER — Encounter: Payer: Self-pay | Admitting: Rehabilitation

## 2019-01-06 ENCOUNTER — Ambulatory Visit: Payer: PPO | Admitting: Rehabilitation

## 2019-01-06 ENCOUNTER — Other Ambulatory Visit: Payer: Self-pay

## 2019-01-06 DIAGNOSIS — R2681 Unsteadiness on feet: Secondary | ICD-10-CM

## 2019-01-06 DIAGNOSIS — M6281 Muscle weakness (generalized): Secondary | ICD-10-CM

## 2019-01-06 DIAGNOSIS — I890 Lymphedema, not elsewhere classified: Secondary | ICD-10-CM

## 2019-01-06 NOTE — Therapy (Signed)
[employer] [place], [place], [address] Phone: [phone]   Fax:  [phone]  .Physical Therapy Treatment Physical Therapy Progress Note  Dates of Reporting Period: 11/28/18 to 01/06/19  Objective Reports of Subjective Statement: see below  Objective Measurements:   Goal Update: see below  Plan: restart CDT Rt LE  Reason Skilled Services are Required: see below    Patient Details  Name: Nicholas [name] MRN: [id number] Date of Birth: 11/16/1945 Referring Provider (PT): Dr. [name]   Encounter Date: 01/06/2019  PT End of Session - 01/06/19 1718    Visit Number  10    Number of Visits  18    Date for PT Re-Evaluation  01/13/19    Authorization Type  [employer] (Medicare)-will need 10th visit progress note    PT Start Time  1600    PT Stop Time  1650    PT Time Calculation (min)  50 min    Activity Tolerance  Patient tolerated treatment well    Behavior During Therapy  [facility] for tasks assessed/performed       Past Medical History:  Diagnosis Date  . 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 Hyperlipidemia   . Hypertension   . Lymphedema     Past Surgical History:  Procedure Laterality Date  . 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 removal of melanoma of left foot/amputation of toes 1&25 Jul 2006  . WRIST FRACTURE SURGERY     73 years old-set    There were no vitals filed for this visit.  Subjective Assessment - 01/06/19 1601    Subjective  Pt arrives with new tributewrap and 2 new exostrong garments.    Pertinent History  Lymphedema-has compression machine and uses stockings (wife helps); hx of PD/Parkinson's Plus Syndrone; malignant melanoma, hyperlipidemia, HTN, insomnia, obesity, hyperglycemia, dizziness, OSA, urinary urgency    Currently in Pain?  No/denies         [facility] PT Assessment - 01/06/19 0001      Assessment   Medical  Diagnosis  bilateral leg lymphedema after melanoma treatment    Referring Provider (PT)  Dr. [name]    Onset Date/Surgical Date  07/24/06    Hand Dominance  Right              Outpatient Rehab from [facility]02/[facility]  Lymphedema Life Impact Scale Total Score  14.71 %           [facility] Adult PT Treatment/Exercise - 01/06/19 0001      Manual Therapy   Edema Management  opened boxes for tribute wrap with it fititng pt except for the upper 2 velcro straps on the lower leg.  Pt will attempt to use this size after bandaging as he refuses to consider a custom garment. he also ordered the wrong size exostrong and will return and get size XXL average per measurements today     Compression Bandaging  Rt LE: thick stockinette, toes with mollelast 1-4, artiflex with extra padding around the ankle to adjust foot shape, then 1 6cm foot, 1 8cm, and 2 10cm bandages applied from ankle to knee (pt did nothave foam with him). blue sock on top of bandages.  exostrong applied to the [employer] LE               PT Short Term Goals - 07/02/18 2049  Additional Short Term Goals   Additional Short Term Goals  Yes      PT SHORT TERM GOAL #6   Title  (fir lymphedema) Pt will have system in place for manage of wound drainage and promotion of wound healing     Time  4    Period  Weeks    Status  New        PT Long Term Goals - 01/06/19 1724      PT LONG TERM GOAL #1   Title  Pt will decrease lymphedema volume in bilateral LEs to allow pt and wife to donn stockings at home     Status  On-going            Plan - 01/06/19 1718    Clinical Impression Statement  Pt arrives with new garments.  need to return the exostrong for the correct size as the old ones only fit due to being stretched out.  Able to get the tribute on with some gapping at the top 2 strips.  Pt educated on wearing a liner under the garment until we are able to decrease the  leg size.  unable to measure due to time but bil feet appearing more red and puffy at the dorsum of the foot with definite increased size.  Will bandage the Rt LE to attempt more reduction until the new garments arrive and pt is able to use new exostrong with tribute night.    Clinical Impairments Affecting Rehab Potential  history of parkinsons diseae with limitations in ablilty to put on compression garments     PT Frequency  3x / week    PT Duration  6 weeks    PT Treatment/Interventions  ADLs/Self Care Home Management;Therapeutic activities;Therapeutic exercise;Patient/family education;Orthotic Fit/Training;Manual techniques;Vasopneumatic Device;Compression bandaging;Manual lymph drainage;Functional mobility training;DME Instruction    PT Next Visit Plan  remeasure LEs, get tribute/larger butler? continue Rt LE CDT until able to switch to the [place]  wife       Patient will benefit from skilled therapeutic intervention in order to improve the following deficits and impairments:  Decreased skin integrity, Postural dysfunction, Obesity, Increased edema, Difficulty walking, Decreased mobility  Visit Diagnosis: 1. Lymphedema, not elsewhere classified   2. Unsteadiness on feet   3. Muscle weakness (generalized)        Problem List Patient Active Problem List   Diagnosis Date Noted  . Strain of right biceps 09/12/2017  . Lymphedema 02/10/2017  . BPH associated with nocturia 05/16/2016  . Senile purpura ([place]) 05/03/2016  . OSA (obstructive sleep apnea) 12/28/2015  . Hypersomnia 11/15/2015  . Cough 11/15/2015  . 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 Dizziness 12/30/2014  . Diastolic dysfunction 16/04/9603  . Former smoker 04/29/2014  . Chronic venous insufficiency 02/20/2014  . Hyperglycemia 08/02/2012  . Morbid obesity ([place]) 07/13/2010  . History of malignant melanoma. left leg 03/09/2009  . Insomnia 10/03/2007  . Hyperlipidemia 12/13/2006  .  Essential hypertension 12/13/2006    Shan Levans, PT 01/06/2019, 5:25 PM  [place], [place], [address] Phone: [phone]   Fax:  [phone]  Name: ZACHREY DEUTSCHER MRN: [id number] Date of Birth: Oct 27, 1945

## 2019-01-07 ENCOUNTER — Encounter: Payer: Self-pay | Admitting: Family Medicine

## 2019-01-07 ENCOUNTER — Ambulatory Visit (INDEPENDENT_AMBULATORY_CARE_PROVIDER_SITE_OTHER): Payer: PPO | Admitting: Family Medicine

## 2019-01-07 VITALS — BP 130/82 | Ht 67.0 in | Wt 235.0 lb

## 2019-01-07 DIAGNOSIS — I10 Essential (primary) hypertension: Secondary | ICD-10-CM

## 2019-01-07 DIAGNOSIS — E785 Hyperlipidemia, unspecified: Secondary | ICD-10-CM | POA: Diagnosis not present

## 2019-01-07 DIAGNOSIS — I890 Lymphedema, not elsewhere classified: Secondary | ICD-10-CM | POA: Diagnosis not present

## 2019-01-07 DIAGNOSIS — Z6835 Body mass index (BMI) 35.0-35.9, adult: Secondary | ICD-10-CM

## 2019-01-07 DIAGNOSIS — G232 Striatonigral degeneration: Secondary | ICD-10-CM

## 2019-01-07 MED ORDER — MIRABEGRON ER 25 MG PO TB24: 25.0000 mg | ORAL_TABLET | Freq: Every day | ORAL | 5 refills | Status: DC

## 2019-01-07 MED ORDER — ROSUVASTATIN CALCIUM 10 MG PO TABS: 10.0000 mg | ORAL_TABLET | Freq: Every day | ORAL | 3 refills | Status: DC

## 2019-01-07 NOTE — Patient Instructions (Addendum)
Depression screen [facility] 2/9 01/07/2019 06/19/2018 02/07/2017  Decreased Interest 0 0 0  Down, Depressed, Hopeless 0 0 0  PHQ - 2 Score 0 0 0  Altered sleeping 0 - -  Tired, decreased energy 0 - -  Change in appetite 0 - -  Feeling bad or failure about yourself  0 - -  Trouble concentrating 0 - -  Moving slowly or fidgety/restless 0 - -  Suicidal thoughts 0 - -  PHQ-9 Score 0 - -  Difficult doing work/chores Not difficult at all - -  Some recent data might be hidden

## 2019-01-07 NOTE — Progress Notes (Signed)
Phone [phone]   Subjective:  Virtual visit via Video note. Chief complaint: Chief Complaint  Patient presents with  . Follow-up  . Hyperlipidemia  . lymphedema   This visit type was conducted due to national recommendations for restrictions regarding the COVID-19 Pandemic (e.g. social distancing).  This format is felt to be most appropriate for this patient at this time balancing risks to patient and risks to population by having him in for in person visit.  No physical exam was performed (except for noted visual exam or audio findings with Telehealth visits).    Our team/I connected with Nicholas [name] at  3:40 PM EDT by a video enabled telemedicine application (doxy.me or caregility through epic) and verified that I am speaking with the correct person using two identifiers.  Location patient: Home[facility] Location provider: [facility], office Persons participating in the virtual visit:  patient  Our team/I discussed the limitations of evaluation and management by telemedicine and the availability of in person appointments. In light of current covid-19 pandemic, patient also understands that we are trying to protect them by minimizing in office contact if at all possible.  The patient expressed consent for telemedicine visit and agreed to proceed. Patient understands insurance will be billed.   ROS- No fever, chills, new cough, new shortness of breath, new body aches, sore throat, or loss of taste or smell  Past Medical History-  Patient Active Problem List   Diagnosis Date Noted  . Lymphedema 02/10/2017    Priority: High  . 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 History of malignant melanoma. left leg 03/09/2009    Priority: High  . BPH associated with nocturia 05/16/2016    Priority: Medium  . OSA (obstructive sleep apnea) 12/28/2015    Priority: Medium  . Diastolic dysfunction 10/93/2355    Priority: Medium  . Chronic venous insufficiency  02/20/2014    Priority: Medium  . Hyperglycemia 08/02/2012    Priority: Medium  . Hyperlipidemia 12/13/2006    Priority: Medium  . Essential hypertension 12/13/2006    Priority: Medium  . Senile purpura ([place]) 05/03/2016    Priority: Low  . Hypersomnia 11/15/2015    Priority: Low  . Former smoker 04/29/2014    Priority: Low  . Morbid obesity ([place]) 07/13/2010    Priority: Low  . Insomnia 10/03/2007    Priority: Low  . Strain of right biceps 09/12/2017  . Cough 11/15/2015  . Dizziness 12/30/2014    Medications- reviewed and updated Current Outpatient Medications  Medication Sig Dispense Refill  . carbidopa-levodopa (SINEMET IR) 25-100 MG tablet TAKE 2 TABLETS AT 7AM,11AM, 3PM,  7PM. 720 tablet 1  . diphenhydrAMINE (BENADRYL) 25 mg capsule Take 25 mg by mouth at bedtime.    . fesoterodine (TOVIAZ) 8 MG TB24 tablet Take 8 mg by mouth daily.    Marland Kitchen ibuprofen (ADVIL,MOTRIN) 200 MG tablet Take 400 mg by mouth every 6 (six) hours as needed for moderate pain.    . rosuvastatin (CRESTOR) 10 MG tablet Take 1 tablet (10 mg total) by mouth daily. 90 tablet 3  . mirabegron ER (MYRBETRIQ) 25 MG TB24 tablet Take 1 tablet (25 mg total) by mouth daily. 30 tablet 5   No current facility-administered medications for this visit.      Objective:  BP 130/82   Ht 5\' 7"  (1.702 m)   Wt 235 lb (106.6 kg)   BMI 36.81 kg/m  self reported vitals Gen: NAD, resting comfortably Lungs:  nonlabored, normal respiratory rate  Skin: appears dry, no obvious rash    Assessment and Plan   #hyperlipidemia S:  controlled on Rosuvastatin 10 mg daily. Tolerating well.  Lab Results  Component Value Date   CHOL 88 06/19/2018   HDL 32.80 (L) 06/19/2018   LDLCALC 31 06/19/2018   LDLDIRECT 50.0 05/03/2016   TRIG 121.0 06/19/2018   CHOLHDL 3 06/19/2018   A/P: LDL under 70 which is ideal- Stable. Continue current medications.   # Lymphedema S:restarted going to lymphedema clinic after going to wound center in  January. Wearing wraps but has hard time getting into compression stockings.  Has a compression machine he uses once a day- if misses that or stockings legs blow up. Takes a full hour so misses second treatment.  A/P: Poor control.  Encouraged patient to try to do 2 sessions of the compression machine and try to wear the compression stockings.  #Parkinson's plus syndrome S: Continues to follow with Dr. Carles Collet and remains on Sinemet.  Finds this helpful.  Has seen urology for irritative symptoms related to Parkinson's in relation to his bladder.  Currently on Toviaz but $90 a month and he finds this difficult to afford.  He had tried Myrbetriq in the past and notes state that it was not effective but he states he had mild dizziness.  He would like to retrial the Myrbetriq A/P: I did send Myrbetriq in for patient and asked him to hold the [place] while trialing this.  If no improvement in symptoms or dizziness recurs he will need to discontinue Myrbetriq and restart the Toviaz despite the cost.  He agrees  # Obesity-technically morbid with BMI over 35 and hypertension and hyperlipidemia S: weight dropping down as getting fluid out of his legs per patient.  He denies any significant increase in exercise or improvement in diet Wt Readings from Last 3 Encounters:  01/07/19 235 lb (106.6 kg)  10/25/18 235 lb (106.6 kg)  06/19/18 256 lb 3.2 oz (116.2 kg)  A/P: Poor control and weight-he states improving due to treatment of lymphedema. Encouraged need for healthy eating, regular exercise, weight loss.    #hypertension S: controlled on no medication BP Readings from Last 3 Encounters:  01/07/19 130/82  10/25/18 (!) 143/85  06/19/18 122/78  A/P: Good control at home-encouraged him to continue to monitor and particularly when he goes on Myrbetriq to make sure it is not elevated.  Labs are reasonable in November 2019-we opted to defer labs for now but make sure to obtain at least once a year-would do it next  visit.  He prefers to minimize in office visits due to covid-19 Future Appointments  Date Time Provider [place]  01/08/2019  2:00 PM Tevis, Adrian Prince, PT [facility] None  01/13/2019  2:00 PM Tevis, Adrian Prince, PT [facility] None  01/15/2019  2:00 PM Tevis, Adrian Prince, PT [facility] None  01/17/2019 11:00 AM Tevis, Adrian Prince, PT [facility] None   Lab/Order associations:   ICD-10-CM   1. Hyperlipidemia, unspecified hyperlipidemia type  E78.5   2. Lymphedema  I89.0   3. 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 Essential hypertension  I10     Meds ordered this encounter  Medications  . rosuvastatin (CRESTOR) 10 MG tablet    Sig: Take 1 tablet (10 mg total) by mouth daily.    Dispense:  90 tablet    Refill:  3  . mirabegron ER (MYRBETRIQ) 25 MG TB24 tablet    Sig:  Take 1 tablet (25 mg total) by mouth daily.    Dispense:  30 tablet    Refill:  5    Return precautions advised.  [name], MD

## 2019-01-08 ENCOUNTER — Other Ambulatory Visit: Payer: Self-pay

## 2019-01-08 ENCOUNTER — Encounter: Payer: Self-pay | Admitting: Rehabilitation

## 2019-01-08 ENCOUNTER — Ambulatory Visit: Payer: PPO | Admitting: Rehabilitation

## 2019-01-08 DIAGNOSIS — I890 Lymphedema, not elsewhere classified: Secondary | ICD-10-CM

## 2019-01-08 DIAGNOSIS — R2681 Unsteadiness on feet: Secondary | ICD-10-CM

## 2019-01-08 DIAGNOSIS — M6281 Muscle weakness (generalized): Secondary | ICD-10-CM

## 2019-01-08 NOTE — Therapy (Signed)
[place], [place], [address] Phone: [phone]   Fax:  [phone]  Physical Therapy Treatment  Patient Details  Name: Nicholas [name] MRN: [id number] Date of Birth: [birth year] Referring Provider (PT): Dr. [name]   Encounter Date: 01/08/2019  PT End of Session - 01/08/19 1449    Visit Number  11    Number of Visits  18    Date for PT Re-Evaluation  01/13/19    PT Start Time  0623    PT Stop Time  1432    PT Time Calculation (min)  38 min    Activity Tolerance  Patient tolerated treatment well    Behavior During Therapy  [facility] for tasks assessed/performed       Past Medical History:  Diagnosis Date  . 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 Hyperlipidemia   . Hypertension   . Lymphedema     Past Surgical History:  Procedure Laterality Date  . 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 removal of melanoma of left foot/amputation of toes 1&25 Jul 2006  . WRIST FRACTURE SURGERY     73 years old-set    There were no vitals filed for this visit.  Subjective Assessment - 01/08/19 1446    Subjective  i got the old garmnets returned    Pertinent History  Lymphedema-has compression machine and uses stockings (wife helps); hx of PD/Parkinson's Plus Syndrone; malignant melanoma, hyperlipidemia, HTN, insomnia, obesity, hyperglycemia, dizziness, OSA, urinary urgency    Currently in Pain?  No/denies            LYMPHEDEMA/ONCOLOGY QUESTIONNAIRE - 01/08/19 1405      Right Lower Extremity Lymphedema   At Midpatella/Popliteal Crease  53 cm   in seated   30 cm Proximal to Floor at Lateral Plantar Foot  52 cm    20 cm Proximal to Floor at Lateral Plantar Foot  43.2 1    10  cm Proximal to Floor at Lateral Malleoli  35 cm    5 cm Proximal to 1st MTP Joint  29 cm    Across MTP Joint  28.7 cm    Around Proximal Great Toe  9.4 cm          [facility]  Lymphedema Life Impact Scale Total Score  14.71 %           [facility] Adult PT Treatment/Exercise - 01/08/19 0001      Manual Therapy   Manual therapy comments  unwrapped bandages which had slipped down to about halfway, washed the LE with mild soap and water, applied thin lotion.  Remeasured Rt LE    Edema Management  made 2 new foam wheels for malleoli and one for the dorsum of the foot but did not use the last one today    Compression Bandaging  Rt LE: thick stockinette, toes with mollelast 1-4, artiflex with extra padding around the ankle to adjust foot shape as well as 1/2" gray wheels to bil malleoli, then 1 6cm foot, 1 8cm, and 2 10cm bandages applied from ankle to knee (pt did nothave foam with him). blue sock on top of bandages.  exostrong applied to the [employer] LE               PT Short Term Goals - 07/02/18 2049      Additional  Short Term Goals   Additional Short Term Goals  Yes      PT SHORT TERM GOAL #6   Title  (fir lymphedema) Pt will have system in place for manage of wound drainage and promotion of wound healing     Time  4    Period  Weeks    Status  New        PT Long Term Goals - 01/06/19 1724      PT LONG TERM GOAL #1   Title  Pt will decrease lymphedema volume in bilateral LEs to allow pt and wife to donn stockings at home     Status  On-going            Plan - 01/08/19 1450    Clinical Impression Statement  Pt was remeasured today and has returned pretty much to baseline size on the Rt LE only wearing old exostrong garments at home.  Rewrapped the Rt LE and pt will use tribute when they come off on Friday.  Pt agreeable to order the Lt side tribute. Pt with a small scratch/open place superior/anterior shin but scabbed over will keep an eye on it.    PT Next Visit Plan  get tribute/larger butler? continue Rt LE CDT until able to switch to the Lt LE (measure  circumference on Wednesdays)       Patient will benefit from skilled therapeutic intervention in order to improve the following deficits and impairments:     Visit Diagnosis: 1. Lymphedema, not elsewhere classified   2. Unsteadiness on feet   3. Muscle weakness (generalized)        Problem List Patient Active Problem List   Diagnosis Date Noted  . Strain of right biceps 09/12/2017  . Lymphedema 02/10/2017  . BPH associated with nocturia 05/16/2016  . Senile purpura ([place]) 05/03/2016  . OSA (obstructive sleep apnea) 12/28/2015  . Hypersomnia 11/15/2015  . Cough 11/15/2015  . 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 Dizziness 12/30/2014  . Diastolic dysfunction 17/40/8144  . Former smoker 04/29/2014  . Chronic venous insufficiency 02/20/2014  . Hyperglycemia 08/02/2012  . Morbid obesity ([place]) 07/13/2010  . History of malignant melanoma. left leg 03/09/2009  . Insomnia 10/03/2007  . Hyperlipidemia 12/13/2006  . Essential hypertension 12/13/2006    Shan Levans, PT 01/08/2019, 2:55 PM  [place], [place], [address] Phone: [phone]   Fax:  [phone]  Name: JULIO ZAPPIA MRN: [id number] Date of Birth: [birth year]

## 2019-01-13 ENCOUNTER — Encounter: Payer: Self-pay | Admitting: Rehabilitation

## 2019-01-13 ENCOUNTER — Ambulatory Visit: Payer: PPO | Admitting: Rehabilitation

## 2019-01-13 ENCOUNTER — Other Ambulatory Visit: Payer: Self-pay

## 2019-01-13 DIAGNOSIS — R2681 Unsteadiness on feet: Secondary | ICD-10-CM

## 2019-01-13 DIAGNOSIS — I890 Lymphedema, not elsewhere classified: Secondary | ICD-10-CM | POA: Diagnosis not present

## 2019-01-13 DIAGNOSIS — M6281 Muscle weakness (generalized): Secondary | ICD-10-CM

## 2019-01-13 NOTE — Therapy (Signed)
[place], [place], [address] Phone: [phone]   Fax:  [phone]  Physical Therapy Treatment  Patient Details  Name: Nicholas [name] MRN: [id number] Date of Birth: 06-12-1946 Referring Provider (PT): Dr. [name]   Encounter Date: 01/13/2019  PT End of Session - 01/13/19 1435    Visit Number  12    Number of Visits  18    Date for PT Re-Evaluation  02/10/19    PT Start Time  1400    PT Stop Time  1430    PT Time Calculation (min)  30 min    Activity Tolerance  Patient tolerated treatment well    Behavior During Therapy  [facility] for tasks assessed/performed       Past Medical History:  Diagnosis Date  . 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 Hyperlipidemia   . Hypertension   . Lymphedema     Past Surgical History:  Procedure Laterality Date  . 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 removal of melanoma of left foot/amputation of toes 1&25 Jul 2006  . WRIST FRACTURE SURGERY     73 years old-set    There were no vitals filed for this visit.  Subjective Assessment - 01/13/19 1403    Subjective  I took my garments off Friday and my wife did not want to help me get my stockings on so they have been off since Friday.  I'm not sure if we have it on correctly. I ordered the new stockings, the larger donner and the left leg tribute so they should be here soon.    Pertinent History  Lymphedema-has compression machine and uses stockings (wife helps); hx of PD/Parkinson's Plus Syndrone; malignant melanoma, hyperlipidemia, HTN, insomnia, obesity, hyperglycemia, dizziness, OSA, urinary urgency    Patient Stated Goals  get back in stockings and to put his his shoes on    Currently in Pain?  No/denies            LYMPHEDEMA/ONCOLOGY QUESTIONNAIRE - 01/13/19 1412      Left Lower Extremity Lymphedema   5 cm Proximal to 1st MTP Joint  30.7 cm    Across  MTP Joint  31 cm    Around Proximal Great Toe  9.8 cm           Outpatient Re[facility] from [facility]  Lymphedema Life Impact Scale Total Score  14.71 %           [facility] Adult PT Treatment/Exercise - 01/13/19 0001      Manual Therapy   Compression Bandaging  Lt LE: thick stockinette, toes with mollelast 1 and 5, black 1/4" foam wheels to the malleoli and extra to the anterior ankle, artiflex with extra padding around the ankle to adjust foot shape .then 1 6cm foot, 1 8cm, and 2 10cm bandages applied from ankle to knee, blue sock on top of bandages.  exostrong appeared well on the Rt LE               PT Short Term Goals - 07/02/18 2049      Additional Short Term Goals   Additional Short Term Goals  Yes      PT SHORT TERM GOAL #6   Title  (fir lymphedema) Pt will have system in place for manage of wound drainage and promotion of wound healing  Time  4    Period  Weeks    Status  New        PT Long Term Goals - 01/13/19 1438      PT LONG TERM GOAL #1   Title  Pt will decrease lymphedema volume in bilateral LEs to allow pt and wife to donn stockings at home     Status  On-going            Plan - 01/13/19 1436    Clinical Impression Statement  Pt arrives being non compliant with garments since Friday of last week.  Did order his new exostrong and tried to use his tribute.  Also ordered a larger donner from [place].  Pt with bilateral exostrong on the legs but the binding at the ankle causing the Lt dorsum of the foot to balloon.  Rt LE looking good in the exostrong today. convinced pt to bandage the Lt LE with his new exostrong hopefully arriving soon.  Pt stating upon leaving "I may not come on Wed" "Ill let you know"    PT Frequency  3x / week    PT Duration  4 weeks    PT Treatment/Interventions  ADLs/Self Care Home Management;Therapeutic activities;Therapeutic exercise;Patient/family education;Orthotic  Fit/Training;Manual techniques;Vasopneumatic Device;Compression bandaging;Manual lymph drainage;Functional mobility training;DME Instruction    PT Next Visit Plan  get tribute/larger butler? continue Rt LE CDT until able to switch to the Lt LE (measure circumference on Wednesdays)       Patient will benefit from skilled therapeutic intervention in order to improve the following deficits and impairments:     Visit Diagnosis: 1. Lymphedema, not elsewhere classified   2. Unsteadiness on feet   3. Muscle weakness (generalized)        Problem List Patient Active Problem List   Diagnosis Date Noted  . Strain of right biceps 09/12/2017  . Lymphedema 02/10/2017  . BPH associated with nocturia 05/16/2016  . Senile purpura ([place]) 05/03/2016  . OSA (obstructive sleep apnea) 12/28/2015  . Hypersomnia 11/15/2015  . Cough 11/15/2015  . 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 Dizziness 12/30/2014  . Diastolic dysfunction 16/04/9603  . Former smoker 04/29/2014  . Chronic venous insufficiency 02/20/2014  . Hyperglycemia 08/02/2012  . Morbid obesity ([place]) 07/13/2010  . History of malignant melanoma. left leg 03/09/2009  . Insomnia 10/03/2007  . Hyperlipidemia 12/13/2006  . Essential hypertension 12/13/2006    Shan Levans, PT 01/13/2019, 2:39 PM  [place], [place], [address] Phone: [phone]   Fax:  [phone]  Name: Nicholas [name] MRN: [id number] Date of Birth: 06-19-1946

## 2019-01-15 ENCOUNTER — Encounter: Payer: PPO | Admitting: Rehabilitation

## 2019-01-17 ENCOUNTER — Ambulatory Visit: Payer: PPO | Admitting: Rehabilitation

## 2019-01-22 ENCOUNTER — Encounter: Payer: Self-pay | Admitting: Rehabilitation

## 2019-01-22 ENCOUNTER — Other Ambulatory Visit: Payer: Self-pay

## 2019-01-22 ENCOUNTER — Ambulatory Visit: Payer: PPO | Attending: Family Medicine | Admitting: Rehabilitation

## 2019-01-22 DIAGNOSIS — R2681 Unsteadiness on feet: Secondary | ICD-10-CM | POA: Insufficient documentation

## 2019-01-22 DIAGNOSIS — I890 Lymphedema, not elsewhere classified: Secondary | ICD-10-CM | POA: Insufficient documentation

## 2019-01-22 NOTE — Therapy (Signed)
[place], [place], [address] Phone: [phone]   Fax:  [phone]  Physical Therapy Treatment  Patient Details  Name: Nicholas [name] MRN: [id number] Date of Birth: 08-11-1945 Referring Provider (PT): Dr. [name]   Encounter Date: 01/22/2019  PT End of Session - 01/22/19 1204    Visit Number  13    Number of Visits  18    Date for PT Re-Evaluation  02/10/19    PT Start Time  1130    PT Stop Time  1200    PT Time Calculation (min)  30 min    Activity Tolerance  Patient tolerated treatment well    Behavior During Therapy  [facility] for tasks assessed/performed       Past Medical History:  Diagnosis Date  . 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 Hyperlipidemia   . Hypertension   . Lymphedema     Past Surgical History:  Procedure Laterality Date  . 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 removal of melanoma of left foot/amputation of toes 1&25 Jul 2006  . WRIST FRACTURE SURGERY     73 years old-set    There were no vitals filed for this visit.  Subjective Assessment - 01/22/19 1129    Subjective  I got my new left tribute.  I don't have my new socks yet. I got the new butler but I don't know if its is any wider    Pertinent History  Lymphedema-has compression machine and uses stockings (wife helps); hx of PD/Parkinson's Plus Syndrone; malignant melanoma, hyperlipidemia, HTN, insomnia, obesity, hyperglycemia, dizziness, OSA, urinary urgency    Patient Stated Goals  get back in stockings and to put his his shoes on    Currently in Pain?  No/denies            LYMPHEDEMA/ONCOLOGY QUESTIONNAIRE - 01/22/19 1130      Right Lower Extremity Lymphedema   30 cm Proximal to Floor at Lateral Plantar Foot  52.2 cm    20 cm Proximal to Floor at Lateral Plantar Foot  45.3 1    10  cm Proximal to Floor at Lateral Malleoli  38.8 cm    5 cm Proximal to 1st  MTP Joint  30 cm    Across MTP Joint  28.7 cm    Around Proximal Great Toe  9.1 cm      Left Lower Extremity Lymphedema   30 cm Proximal to Floor at Lateral Plantar Foot  51.5 cm    20 cm Proximal to Floor at Lateral Plantar Foot  48 cm    10 cm Proximal to Floor at Lateral Malleoli  36.2 cm    5 cm Proximal to 1st MTP Joint  29.7 cm    Across MTP Joint  29 cm    Around Proximal Great Toe  9.4 cm           [facility]  Lymphedema Life Impact Scale Total Score  14.71 %           [facility] Adult PT Treatment/Exercise - 01/22/19 0001      Manual Therapy   Edema Management  remeasured pts LEs today and helped him apply his 2 tribute wraps to the lower legs along with the covering and a blue surgical shoe.  Pt would like to wear these out of the  clinic.  He should get his new exostrong garments any day now and ready for DC               PT Short Term Goals - 07/02/18 2049      Additional Short Term Goals   Additional Short Term Goals  Yes      PT SHORT TERM GOAL #6   Title  (fir lymphedema) Pt will have system in place for manage of wound drainage and promotion of wound healing     Time  4    Period  Weeks    Status  New        PT Long Term Goals - 01/13/19 1438      PT LONG TERM GOAL #1   Title  Pt will decrease lymphedema volume in bilateral LEs to allow pt and wife to donn stockings at home     Status  On-going            Plan - 01/22/19 1205    Clinical Impression Statement  Pt still not wanting to wrap the legs.  Measurements on the Rt LE mostly unchanged from last time and PT able to get the tribute completely closed today compared to the first day. Applied the Lt LE tribute today and helped pt to his car    PT Treatment/Interventions  ADLs/Self Care Home Management;Therapeutic activities;Therapeutic exercise;Patient/family education;Orthotic Fit/Training;Manual  techniques;Vasopneumatic Device;Compression bandaging;Manual lymph drainage;Functional mobility training;DME Instruction    PT Next Visit Plan  get garments, ready for DC home? need a different donning option       Patient will benefit from skilled therapeutic intervention in order to improve the following deficits and impairments:     Visit Diagnosis: 1. Lymphedema, not elsewhere classified   2. Unsteadiness on feet        Problem List Patient Active Problem List   Diagnosis Date Noted  . Strain of right biceps 09/12/2017  . Lymphedema 02/10/2017  . BPH associated with nocturia 05/16/2016  . Senile purpura ([place]) 05/03/2016  . OSA (obstructive sleep apnea) 12/28/2015  . Hypersomnia 11/15/2015  . Cough 11/15/2015  . 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 Dizziness 12/30/2014  . Diastolic dysfunction 24/40/1027  . Former smoker 04/29/2014  . Chronic venous insufficiency 02/20/2014  . Hyperglycemia 08/02/2012  . Morbid obesity ([place]) 07/13/2010  . History of malignant melanoma. left leg 03/09/2009  . Insomnia 10/03/2007  . Hyperlipidemia 12/13/2006  . Essential hypertension 12/13/2006    Shan Levans, PT 01/22/2019, 12:08 PM  [place], [place], [address] Phone: [phone]   Fax:  [phone]  Name: Nicholas [name] MRN: [id number] Date of Birth: Dec 02, 1945

## 2019-01-31 ENCOUNTER — Encounter

## 2019-02-04 DIAGNOSIS — Z85828 Personal history of other malignant neoplasm of skin: Secondary | ICD-10-CM | POA: Diagnosis not present

## 2019-02-04 DIAGNOSIS — Z85820 Personal history of malignant melanoma of skin: Secondary | ICD-10-CM | POA: Diagnosis not present

## 2019-02-04 DIAGNOSIS — L821 Other seborrheic keratosis: Secondary | ICD-10-CM | POA: Diagnosis not present

## 2019-02-05 ENCOUNTER — Ambulatory Visit: Payer: PPO | Admitting: Rehabilitation

## 2019-02-05 ENCOUNTER — Other Ambulatory Visit: Payer: Self-pay

## 2019-02-05 ENCOUNTER — Encounter: Payer: Self-pay | Admitting: Rehabilitation

## 2019-02-05 DIAGNOSIS — I890 Lymphedema, not elsewhere classified: Secondary | ICD-10-CM | POA: Diagnosis not present

## 2019-02-05 DIAGNOSIS — R2681 Unsteadiness on feet: Secondary | ICD-10-CM

## 2019-02-05 NOTE — Therapy (Signed)
[place], [place], [address] Phone: [phone]   Fax:  [phone]  Physical Therapy Treatment  Patient Details  Name: Nicholas [name] MRN: [id number] Date of Birth: 1946-03-30 Referring Provider (PT): Dr. [name]   Encounter Date: 02/05/2019  PT End of Session - 02/05/19 1608    Visit Number  14    Number of Visits  18    Date for PT Re-Evaluation  02/10/19    PT Start Time  1308    PT Stop Time  1350    PT Time Calculation (min)  42 min    Activity Tolerance  Patient tolerated treatment well    Behavior During Therapy  [facility] for tasks assessed/performed       Past Medical History:  Diagnosis Date  . 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 Hyperlipidemia   . Hypertension   . Lymphedema     Past Surgical History:  Procedure Laterality Date  . 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 removal of melanoma of left foot/amputation of toes 1&25 Jul 2006  . WRIST FRACTURE SURGERY     73 years old-set    There were no vitals filed for this visit.  Subjective Assessment - 02/05/19 1512    Subjective  I think this is my last day.  Got the big butler and now both socks in the right size and both tribute    Pertinent History  Lymphedema-has compression machine and uses stockings (wife helps); hx of PD/Parkinson's Plus Syndrone; malignant melanoma, hyperlipidemia, HTN, insomnia, obesity, hyperglycemia, dizziness, OSA, urinary urgency    Patient Stated Goals  get back in stockings and to put his his shoes on    Currently in Pain?  No/denies            LYMPHEDEMA/ONCOLOGY QUESTIONNAIRE - 02/05/19 1513      Right Lower Extremity Lymphedema   At Midpatella/Popliteal Crease  48.5 cm   seated   30 cm Proximal to Floor at Lateral Plantar Foot  54.3 cm    20 cm Proximal to Floor at Lateral Plantar Foot  48._0 cm Proximal to Floor at Lateral  Malleoli  40.8 cm    5 cm Proximal to 1st MTP Joint  33 cm    Across MTP Joint  32.5 cm    Around Proximal Great Toe  9.7 cm      Left Lower Extremity Lymphedema   30 cm Proximal to Floor at Lateral Plantar Foot  50.5 cm    20 cm Proximal to Floor at Lateral Plantar Foot  46.8 cm    5 cm Proximal to 1st MTP Joint  31.2 cm    Across MTP Joint  31 cm    Around Proximal Great Toe  9.7 cm           [facility]  Lymphedema Life Impact Scale Total Score  14.71 %           [facility] Adult PT Treatment/Exercise - 02/05/19 0001      Manual Therapy   Manual therapy comments  Due to size increase bilaterally we discussed that wrapping again 3x per week and moving to a custom flat knit may be the only way to decrease the size long term as his current routine is not working well.  Pt declines  wrapping and wants to try with what he has.  " I will come back in 10 days and see how I am"     Edema Management  remeasured LEs today and helped pt try to independently apply LE garments to the knee with his new big butler.  Pt still needs minA with donning due to decreased LE strength and ROM, being unable to reach the feet.  We used pillowcases around the handles to try and extend the length which helped                PT Short Term Goals - 07/02/18 2049      Additional Short Term Goals   Additional Short Term Goals  Yes      PT SHORT TERM GOAL #6   Title  (fir lymphedema) Pt will have system in place for manage of wound drainage and promotion of wound healing     Time  4    Period  Weeks    Status  New        PT Long Term Goals - 02/05/19 1609      PT LONG TERM GOAL #1   Title  Pt will decrease lymphedema volume in bilateral LEs to allow pt and wife to donn stockings at home     Status  Partially Met            Plan - 02/05/19 1608    Clinical Impression Statement  Due to size increase bilaterally we  discussed that wrapping again 3x per week and moving to a custom flat knit may be the only way to decrease the size long term as his current routine is not working well.  Pt declines wrapping and wants to try with what he has.  " I will come back in 10 days and see how I am"  Today focused on getting pt ind with big butler and stockings but he still needs minA for donning which his wife struggles with.    PT Treatment/Interventions  ADLs/Self Care Home Management;Therapeutic activities;Therapeutic exercise;Patient/family education;Orthotic Fit/Training;Manual techniques;Vasopneumatic Device;Compression bandaging;Manual lymph drainage;Functional mobility training;DME Instruction    PT Next Visit Plan  get garments, ready for DC home? need a different donning option    Consulted and Agree with Plan of Care  Patient       Patient will benefit from skilled therapeutic intervention in order to improve the following deficits and impairments:     Visit Diagnosis: 1. Lymphedema, not elsewhere classified   2. Unsteadiness on feet        Problem List Patient Active Problem List   Diagnosis Date Noted  . Strain of right biceps 09/12/2017  . Lymphedema 02/10/2017  . BPH associated with nocturia 05/16/2016  . Senile purpura ([place]) 05/03/2016  . OSA (obstructive sleep apnea) 12/28/2015  . Hypersomnia 11/15/2015  . Cough 11/15/2015  . 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 Dizziness 12/30/2014  . Diastolic dysfunction 31/49/7026  . Former smoker 04/29/2014  . Chronic venous insufficiency 02/20/2014  . Hyperglycemia 08/02/2012  . Morbid obesity ([place]) 07/13/2010  . History of malignant melanoma. left leg 03/09/2009  . Insomnia 10/03/2007  . Hyperlipidemia 12/13/2006  . Essential hypertension 12/13/2006    [name] 02/05/2019, [place], [place], [address] Phone: [phone]   Fax:   [phone]  Name: QUASEAN FRYE MRN: [id number] Date of Birth: 03-22-1946

## 2019-02-19 ENCOUNTER — Ambulatory Visit: Payer: PPO | Admitting: Rehabilitation

## 2019-02-19 ENCOUNTER — Other Ambulatory Visit: Payer: Self-pay

## 2019-02-19 ENCOUNTER — Encounter: Payer: Self-pay | Admitting: Rehabilitation

## 2019-02-19 DIAGNOSIS — I890 Lymphedema, not elsewhere classified: Secondary | ICD-10-CM

## 2019-02-19 NOTE — Therapy (Signed)
[name], [place], [address] Phone: [phone]   Fax:  [phone]  Physical Therapy Treatment  Patient Details  Name: Nicholas [name] MRN: [id number] Date of Birth: [birth year] Referring Provider (PT): Dr. [name]   Encounter Date: 02/19/2019  PT End of Session - 02/19/19 1603    Visit Number  15    Number of Visits  18    Authorization Type  [employer] (Medicare)-will need 10th visit progress note    PT Start Time  1600    PT Stop Time  1630    PT Time Calculation (min)  30 min    Activity Tolerance  Patient tolerated treatment well    Behavior During Therapy  [facility] for tasks assessed/performed       Past Medical History:  Diagnosis Date  . 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 Hyperlipidemia   . Hypertension   . Lymphedema     Past Surgical History:  Procedure Laterality Date  . 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 removal of melanoma of left foot/amputation of toes 1&25 Jul 2006  . WRIST FRACTURE SURGERY     73 years old-set    There were no vitals filed for this visit.  Subjective Assessment - 02/19/19 1602    Subjective  I got my 2 new stockings.  Just see if they fit    Pertinent History  Lymphedema-has compression machine and uses stockings (wife helps); hx of PD/Parkinson's Plus Syndrone; malignant melanoma, hyperlipidemia, HTN, insomnia, obesity, hyperglycemia, dizziness, OSA, urinary urgency    Currently in Pain?  No/denies            LYMPHEDEMA/ONCOLOGY QUESTIONNAIRE - 02/19/19 1604      Right Lower Extremity Lymphedema   30 cm Proximal to Floor at Lateral Plantar Foot  54.2 cm    20 cm Proximal to Floor at Lateral Plantar Foot  47 1    10 cm Proximal to Floor at Lateral Malleoli  37.7 cm    5 cm Proximal to 1st MTP Joint  31.8 cm    Across MTP Joint  30.5 cm    Around Proximal Great Toe  9.7 cm      Left  Lower Extremity Lymphedema   30 cm Proximal to Floor at Lateral Plantar Foot  51.2 cm    20 cm Proximal to Floor at Lateral Plantar Foot  46.2 cm    10 cm Proximal to Floor at Lateral Malleoli  37.1 cm    5 cm Proximal to 1st MTP Joint  30.8 cm    Across MTP Joint  29.9 cm    Around Proximal Great Toe  9.7 cm           Outpatient Re[facility]/02/[facility]  Lymphedema Life Impact Scale Total Score  14.71 %           [facility] Adult PT Treatment/Exercise - 02/19/19 0001      Manual Therapy   Edema Management  remeasured LEs today for DC; helped pt donn new exostrong XXL with silicone border on the top for final fitting.  They fit well with pt reminded to watch at the ankle crease where it tends to rub if they don't pull it up enough.                PT Short Term Goals -  07/02/18 2049      Additional Short Term Goals   Additional Short Term Goals  Yes      PT SHORT TERM GOAL #6   Title  (fir lymphedema) Pt will have system in place for manage of wound drainage and promotion of wound healing     Time  4    Period  Weeks    Status  New        PT Long Term Goals - 02/19/19 1640      PT LONG TERM GOAL #1   Title  Pt will decrease lymphedema volume in bilateral LEs to allow pt and wife to donn stockings at home     Status  Achieved            Plan - 02/19/19 1639    Clinical Impression Statement  Pt requesting just measurements for DC and donning of his new exostrong garments.  They fit well bilaterally and pt is doing well with his flexitouch, tribute night garments, and new exostrong. Pt just got out of his flexitouch so his measurements were much improved since last visit.  Will DC today.    Consulted and Agree with Plan of Care  Patient       Patient will benefit from skilled therapeutic intervention in order to improve the following deficits and impairments:     Visit Diagnosis: 1. Lymphedema, not elsewhere  classified        Problem List Patient Active Problem List   Diagnosis Date Noted  . Strain of right biceps 09/12/2017  . Lymphedema 02/10/2017  . BPH associated with nocturia 05/16/2016  . Senile purpura ([place]) 05/03/2016  . OSA (obstructive sleep apnea) 12/28/2015  . Hypersomnia 11/15/2015  . Cough 11/15/2015  . 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 Dizziness 12/30/2014  . Diastolic dysfunction 54/98/2641  . Former smoker 04/29/2014  . Chronic venous insufficiency 02/20/2014  . Hyperglycemia 08/02/2012  . Morbid obesity ([place]) 07/13/2010  . History of malignant melanoma. left leg 03/09/2009  . Insomnia 10/03/2007  . Hyperlipidemia 12/13/2006  . Essential hypertension 12/13/2006    [name] 02/19/2019, 4:41 PM  [place], [place], [address] Phone: [phone]   Fax:  [phone]  Name: Nicholas [name] MRN: [id number] Date of Birth: Dec 17, 1945  PHYSICAL THERAPY DISCHARGE SUMMARY  Visits from Start of Care: 15  Current functional level related to goals / functional outcomes: Pt is now ind with garments and flexitouch at home.     Remaining deficits: Pt and wife still have difficulty donning garments due to mobility issues.  Medi butler helps    Education / Equipment: Self care for LE, Plan: Patient agrees to discharge.  Patient goals were met. Patient is being discharged due to meeting the stated rehab goals.  ?????    Shan Levans, PT

## 2019-03-02 IMAGING — RF DG SWALLOWING FUNCTION
8 series · 19 of 24 positions shown · non-contrast
Comparison: None.

CLINICAL DATA: 71-year-old male with history of Parkinson's disease
presenting with history of dysphagia.

EXAM:
MODIFIED BARIUM SWALLOW
TECHNIQUE: Different consistencies of barium were administered orally to the
patient by the Speech Pathologist. Imaging of the pharynx was
performed in the lateral projection.
FLUOROSCOPY TIME:  Fluoroscopy Time:  48 seconds
Radiation Exposure Index (if provided by the fluoroscopic device):
10.5 mGy

[Series 1: cp_standard · 0.34mm/px · 2 of 48 frames shown (1 of 8)]
[frame 8/48]
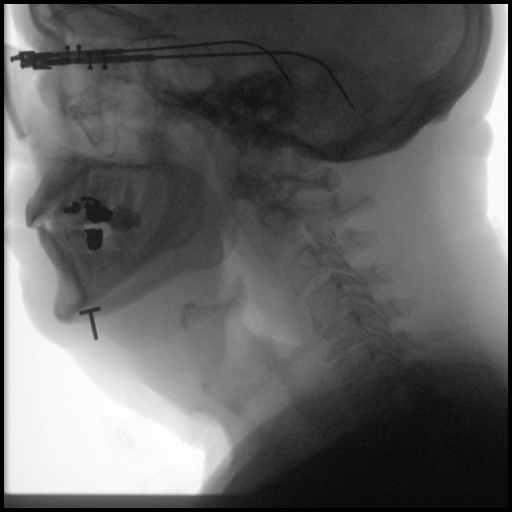
[frame 25/48]
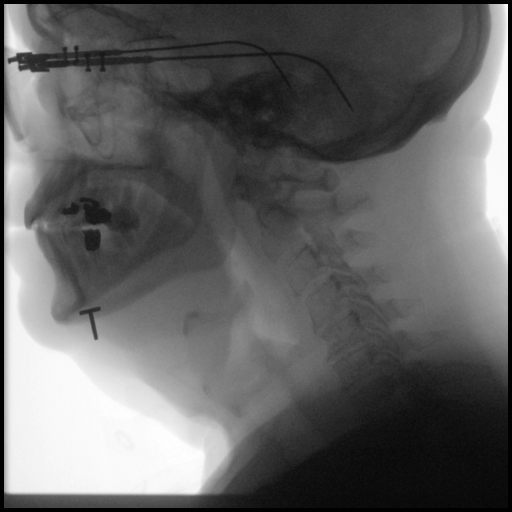

[Series 2: cp_standard · 0.34mm/px · 3 of 82 frames shown (2 of 8)]
[frame 13/82]
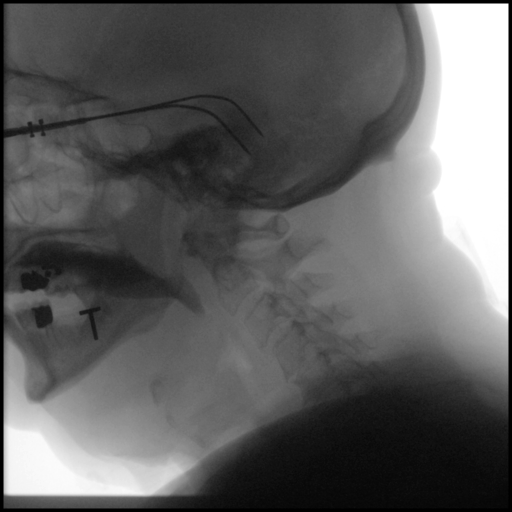
[frame 42/82]
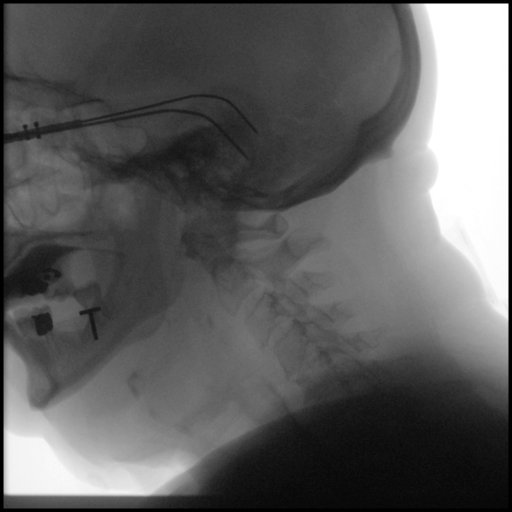
[frame 70/82]
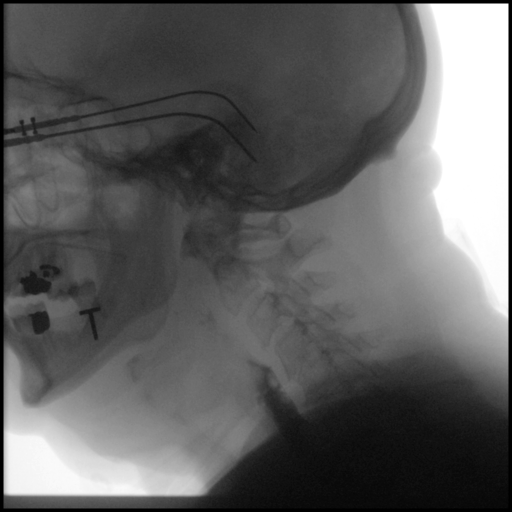

[Series 3: cp_standard · 0.34mm/px · 2 of 64 frames shown (3 of 8)]
[frame 10/64]
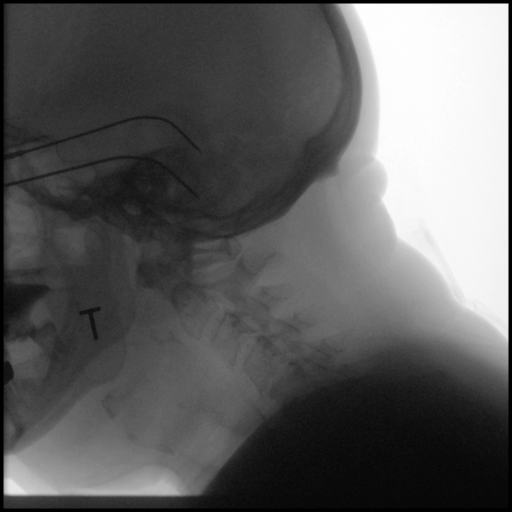
[frame 55/64]
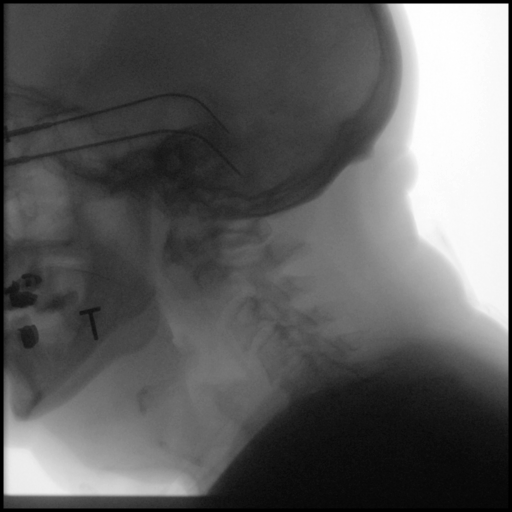

[Series 4: cp_standard · 0.34mm/px · 2 of 74 frames shown (4 of 8)]
[frame 12/74]
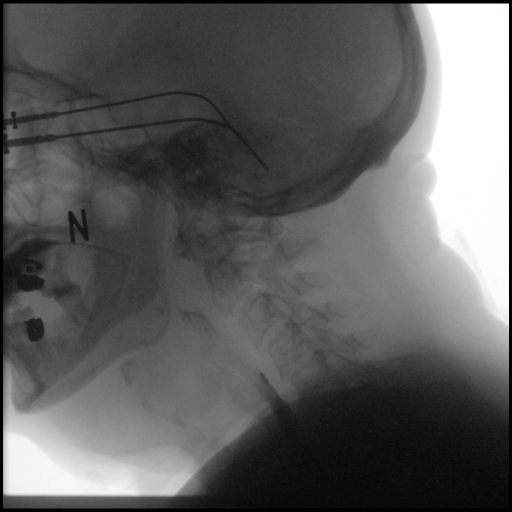
[frame 38/74]
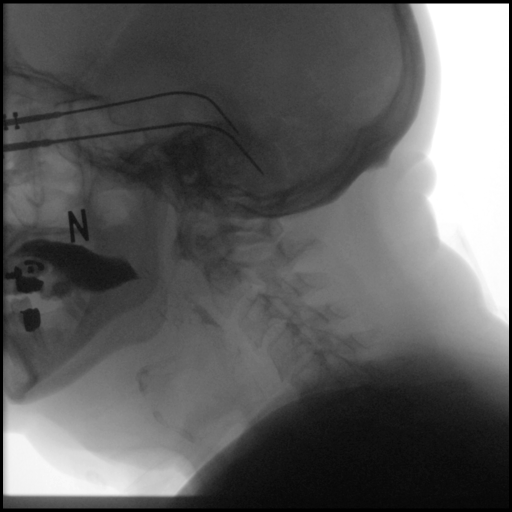

[Series 5: cp_standard · 0.35mm/px · 3 of 31 frames shown (5 of 8)]
[frame 5/31]
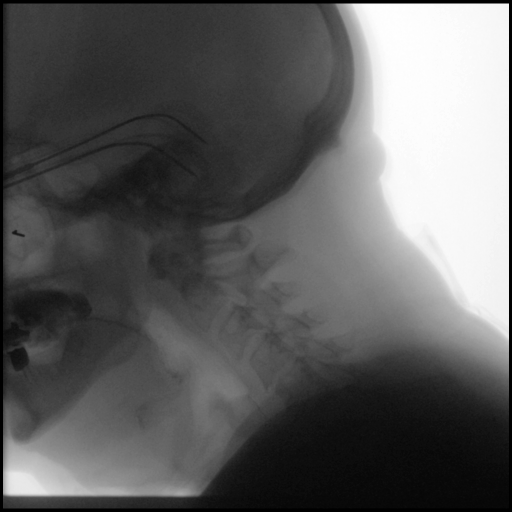
[frame 16/31]
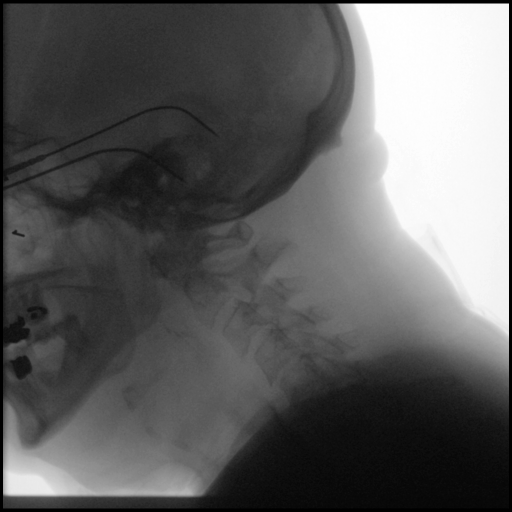
[frame 27/31]
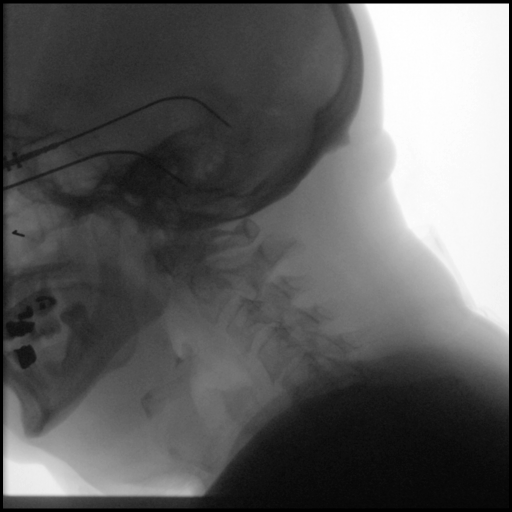

[Series 6: cp_standard · 0.35mm/px · 2 of 35 frames shown (6 of 8)]
[frame 6/35]
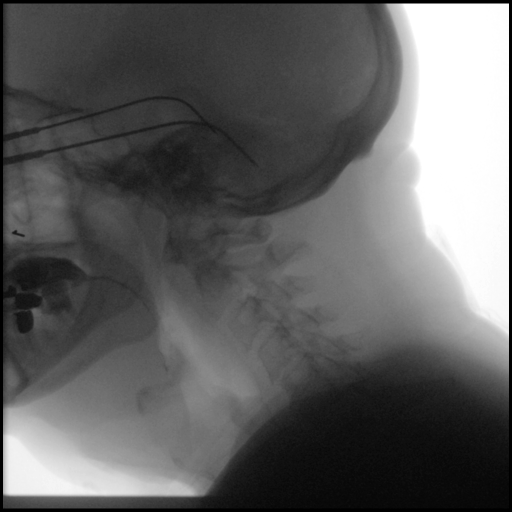
[frame 30/35]
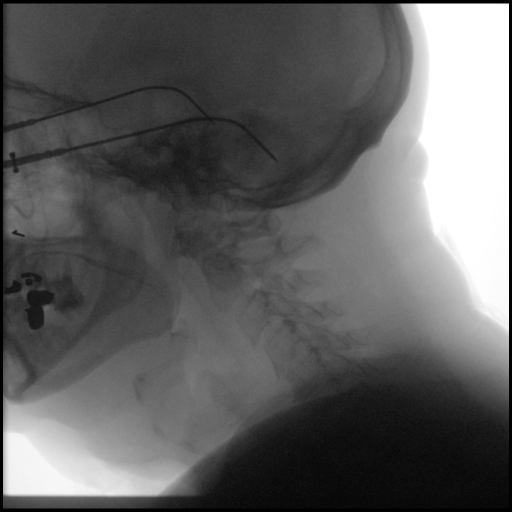

[Series 7: cp_standard · 0.35mm/px · 3 of 24 frames shown (7 of 8)]
[frame 4/24]
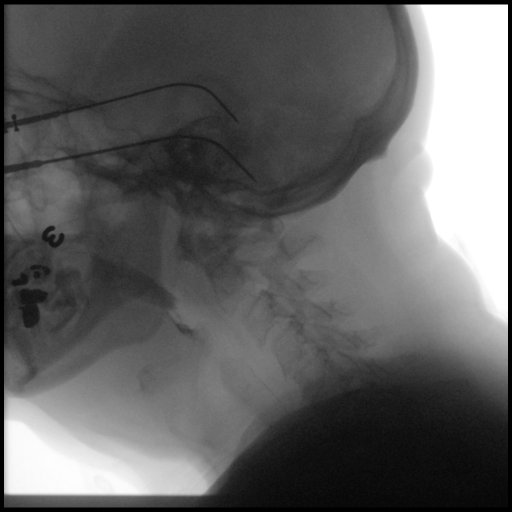
[frame 15/24]
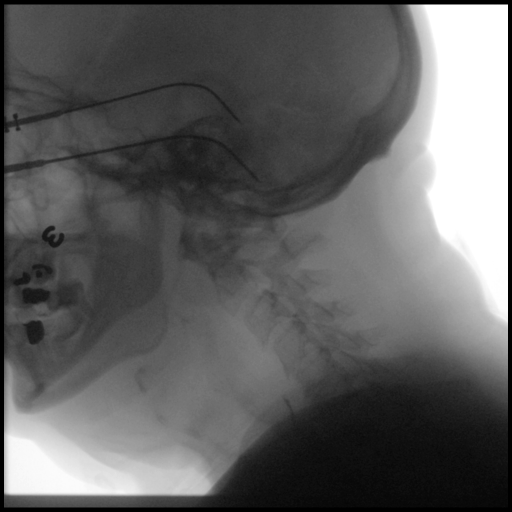
[frame 21/24]
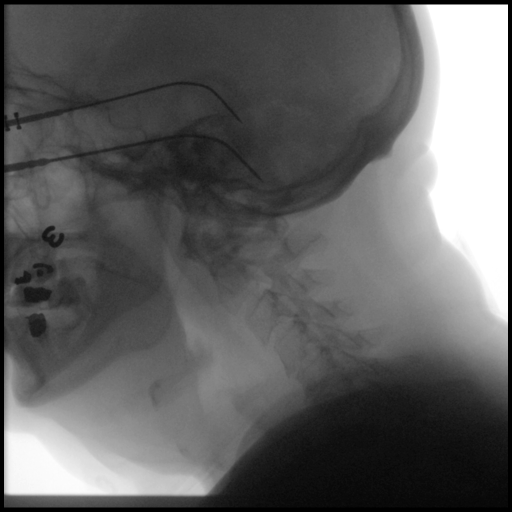

[Series 8: cp_standard · 0.35mm/px · 2 of 139 frames shown (8 of 8)]
[frame 70/139]
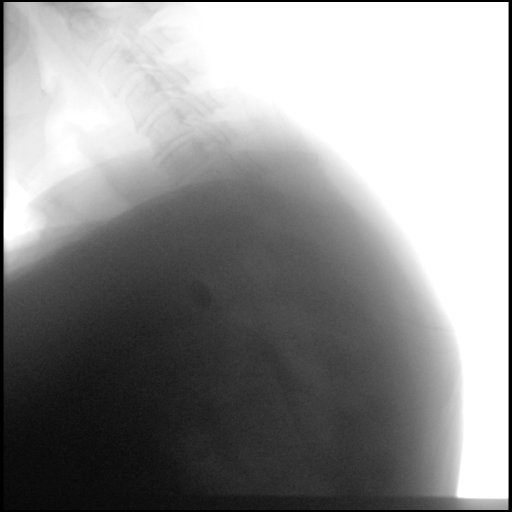
[frame 119/139]
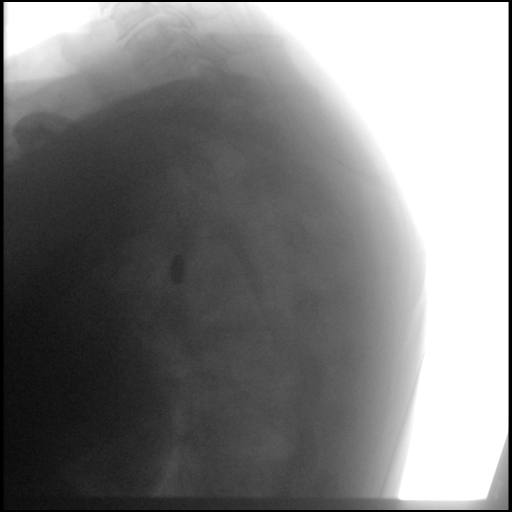

[19 of 24 positions shown; findings below may reference images not displayed]

FINDINGS: Thin liquid- within normal limits

Nectar thick liquid- within normal limits

Pure- within normal limits

Pure with cracker- within normal limits

Barium tablet -  within normal limits
IMPRESSION: 1. Swallow study was within normal limits, as above.

Please refer to the Speech Pathologists report for complete details
and recommendations.

## 2019-03-04 DIAGNOSIS — G4733 Obstructive sleep apnea (adult) (pediatric): Secondary | ICD-10-CM | POA: Diagnosis not present

## 2019-04-14 ENCOUNTER — Other Ambulatory Visit: Payer: Self-pay

## 2019-04-14 ENCOUNTER — Ambulatory Visit (INDEPENDENT_AMBULATORY_CARE_PROVIDER_SITE_OTHER): Payer: PPO

## 2019-04-14 ENCOUNTER — Encounter: Payer: Self-pay | Admitting: Family Medicine

## 2019-04-14 VITALS — BP 126/78 | Temp 98.1°F | Ht 67.0 in

## 2019-04-14 DIAGNOSIS — Z0000 Encounter for general adult medical examination without abnormal findings: Secondary | ICD-10-CM | POA: Diagnosis not present

## 2019-04-14 DIAGNOSIS — Z23 Encounter for immunization: Secondary | ICD-10-CM | POA: Diagnosis not present

## 2019-04-14 NOTE — Patient Instructions (Signed)
Nicholas [name] , Thank you for taking time to come for your Medicare Wellness Visit. I appreciate your ongoing commitment to your health goals. Please review the following plan we discussed and let me know if I can assist you in the future.   Screening recommendations/referrals: Colorectal Screening: up to date; last 04/09/09  Vision and Dental Exams: Recommended annual ophthalmology exams for early detection of glaucoma and other disorders of the eye Recommended annual dental exams for proper oral hygiene  Vaccinations: Influenza vaccine: today  Pneumococcal vaccine: up to date; last 12/30/14 Tdap vaccine: up to date; last 08/02/12  Shingles vaccine: Please call your insurance company to determine your out of pocket expense for the Shingrix vaccine. You may receive this vaccine at your local pharmacy.  Advanced directives: Please bring a copy of your POA (Power of Attorney) and/or Living Will to your next appointment.  Goals: Recommend to remove any items from the home that may cause slips or trips.   Next appointment: Please schedule your Annual Wellness Visit with your Nurse Health Advisor in one year.  Preventive Care 15 Years and Older, Male Preventive care refers to lifestyle choices and visits with your health care provider that can promote health and wellness. What does preventive care include?  A yearly physical exam. This is also called an annual well check.  Dental exams once or twice a year.  Routine eye exams. Ask your health care provider how often you should have your eyes checked.  Personal lifestyle choices, including:  Daily care of your teeth and gums.  Regular physical activity.  Eating a healthy diet.  Avoiding tobacco and drug use.  Limiting alcohol use.  Practicing safe sex.  Taking low doses of aspirin every day if recommended by your health care provider..  Taking vitamin and mineral supplements as recommended by your health care provider. What  happens during an annual well check? The services and screenings done by your health care provider during your annual well check will depend on your age, overall health, lifestyle risk factors, and family history of disease. Counseling  Your health care provider may ask you questions about your:  Alcohol use.  Tobacco use.  Drug use.  Emotional well-being.  Home and relationship well-being.  Sexual activity.  Eating habits.  History of falls.  Memory and ability to understand (cognition).  Work and work Statistician. Screening  You may have the following tests or measurements:  Height, weight, and BMI.  Blood pressure.  Lipid and cholesterol levels. These may be checked every 5 years, or more frequently if you are over 36 years old.  Skin check.  Lung cancer screening. You may have this screening every year starting at age 45 if you have a 30-pack-year history of smoking and currently smoke or have quit within the past 15 years.  Fecal occult blood test (FOBT) of the stool. You may have this test every year starting at age 44.  Flexible sigmoidoscopy or colonoscopy. You may have a sigmoidoscopy every 5 years or a colonoscopy every 10 years starting at age 30.  Prostate cancer screening. Recommendations will vary depending on your family history and other risks.  Hepatitis C blood test.  Hepatitis B blood test.  Sexually transmitted disease (STD) testing.  Diabetes screening. This is done by checking your blood sugar (glucose) after you have not eaten for a while (fasting). You may have this done every 1-3 years.  Abdominal aortic aneurysm (AAA) screening. You may need this if you are  a current or former smoker.  Osteoporosis. You may be screened starting at age 75 if you are at high risk. Talk with your health care provider about your test results, treatment options, and if necessary, the need for more tests. Vaccines  Your health care provider may recommend  certain vaccines, such as:  Influenza vaccine. This is recommended every year.  Tetanus, diphtheria, and acellular pertussis (Tdap, Td) vaccine. You may need a Td booster every 10 years.  Zoster vaccine. You may need this after age 93.  Pneumococcal 13-valent conjugate (PCV13) vaccine. One dose is recommended after age 56.  Pneumococcal polysaccharide (PPSV23) vaccine. One dose is recommended after age 8. Talk to your health care provider about which screenings and vaccines you need and how often you need them. This information is not intended to replace advice given to you by your health care provider. Make sure you discuss any questions you have with your health care provider. Document Released: 08/06/2015 Document Revised: 03/29/2016 Document Reviewed: 05/11/2015 Elsevier Interactive Patient Education  2017 [place] Prevention in the Home Falls can cause injuries. They can happen to people of all ages. There are many things you can do to make your home safe and to help prevent falls. What can I do on the outside of my home?  Regularly fix the edges of walkways and driveways and fix any cracks.  Remove anything that might make you trip as you walk through a door, such as a raised step or threshold.  Trim any bushes or trees on the path to your home.  Use bright outdoor lighting.  Clear any walking paths of anything that might make someone trip, such as rocks or tools.  Regularly check to see if handrails are loose or broken. Make sure that both sides of any steps have handrails.  Any raised decks and porches should have guardrails on the edges.  Have any leaves, snow, or ice cleared regularly.  Use sand or salt on walking paths during winter.  Clean up any spills in your garage right away. This includes oil or grease spills. What can I do in the bathroom?  Use night lights.  Install grab bars by the toilet and in the tub and shower. Do not use towel bars as  grab bars.  Use non-skid mats or decals in the tub or shower.  If you need to sit down in the shower, use a plastic, non-slip stool.  Keep the floor dry. Clean up any water that spills on the floor as soon as it happens.  Remove soap buildup in the tub or shower regularly.  Attach bath mats securely with double-sided non-slip rug tape.  Do not have throw rugs and other things on the floor that can make you trip. What can I do in the bedroom?  Use night lights.  Make sure that you have a light by your bed that is easy to reach.  Do not use any sheets or blankets that are too big for your bed. They should not hang down onto the floor.  Have a firm chair that has side arms. You can use this for support while you get dressed.  Do not have throw rugs and other things on the floor that can make you trip. What can I do in the kitchen?  Clean up any spills right away.  Avoid walking on wet floors.  Keep items that you use a lot in easy-to-reach places.  If you need to reach something  above you, use a strong step stool that has a grab bar.  Keep electrical cords out of the way.  Do not use floor polish or wax that makes floors slippery. If you must use wax, use non-skid floor wax.  Do not have throw rugs and other things on the floor that can make you trip. What can I do with my stairs?  Do not leave any items on the stairs.  Make sure that there are handrails on both sides of the stairs and use them. Fix handrails that are broken or loose. Make sure that handrails are as long as the stairways.  Check any carpeting to make sure that it is firmly attached to the stairs. Fix any carpet that is loose or worn.  Avoid having throw rugs at the top or bottom of the stairs. If you do have throw rugs, attach them to the floor with carpet tape.  Make sure that you have a light switch at the top of the stairs and the bottom of the stairs. If you do not have them, ask someone to add them  for you. What else can I do to help prevent falls?  Wear shoes that:  Do not have high heels.  Have rubber bottoms.  Are comfortable and fit you well.  Are closed at the toe. Do not wear sandals.  If you use a stepladder:  Make sure that it is fully opened. Do not climb a closed stepladder.  Make sure that both sides of the stepladder are locked into place.  Ask someone to hold it for you, if possible.  Clearly mark and make sure that you can see:  Any grab bars or handrails.  First and last steps.  Where the edge of each step is.  Use tools that help you move around (mobility aids) if they are needed. These include:  Canes.  Walkers.  Scooters.  Crutches.  Turn on the lights when you go into a dark area. Replace any light bulbs as soon as they burn out.  Set up your furniture so you have a clear path. Avoid moving your furniture around.  If any of your floors are uneven, fix them.  If there are any pets around you, be aware of where they are.  Review your medicines with your doctor. Some medicines can make you feel dizzy. This can increase your chance of falling. Ask your doctor what other things that you can do to help prevent falls. This information is not intended to replace advice given to you by your health care provider. Make sure you discuss any questions you have with your health care provider. Document Released: 05/06/2009 Document Revised: 12/16/2015 Document Reviewed: 08/14/2014 Elsevier Interactive Patient Education  2017 [employer].

## 2019-04-14 NOTE — Progress Notes (Signed)
I have reviewed and agree with note, evaluation, plan.   Thanks for the updates Loma Sousa- thank you for helping him try to find an affordable option as well for the [place].  He can also contact urology who is prescribing medication.  I am sorry to hear the compression hose are so difficult for him-they are also very important for him.  [name], MD

## 2019-04-14 NOTE — Progress Notes (Signed)
Subjective:   Nicholas [name] is a 73 y.o. male who presents for Medicare Annual/Subsequent preventive examination.  Review of Systems:   Cardiac Risk Factors include: advanced age (>14men, >8 women);sedentary lifestyle;male gender;dyslipidemia     Objective:    Vitals: There were no vitals taken for this visit.  There is no height or weight on file to calculate BMI.  Advanced Directives 04/14/2019 11/28/2018 10/25/2018 10/25/2018 05/14/2018 02/05/2018 01/08/2018  Does Patient Have a Medical Advance Directive? Yes Yes Yes Yes No Yes Yes  Type of Paramedic of [place];Living will - - - [name];Living will [name];Living will  Does patient want to make changes to medical advance directive? No - Patient declined - - - - - -  Copy of [name] in Chart? No - copy requested - - - - - -  Would patient like information on creating a medical advance directive? - - - - - - -    Tobacco Social History   Tobacco Use  Smoking Status Former Smoker   Packs/day: 1.00   Years: 16.00   Pack years: 16.00   Types: Cigarettes   Quit date: 12/22/1993   Years since quitting: 25.3  Smokeless Tobacco Never Used     Counseling given: Not Answered   Clinical Intake:  Pre-visit preparation completed: Yes  Pain : No/denies pain  Diabetes: No  How often do you need to have someone help you when you read instructions, pamphlets, or other written materials from your doctor or pharmacy?: 1 - Never  Interpreter Needed?: No  Information entered by :: [name]  Past Medical History:  Diagnosis Date   Cancer [facility]) Jan 2008   skin; hx of melanoma left foot/ amputation of toes 1&2    Hyperlipidemia    Hypertension    Lymphedema    Past Surgical History:  Procedure Laterality Date   4th toe- 2nd primary melanoma  2009   removal of melanoma of left foot/amputation  of toes 1&25 Jul 2006   WRIST FRACTURE SURGERY     73 years old-set   Family History  Problem Relation Age of Onset   Hypertension Father    CVA Father        age 48   Hyperlipidemia Father    Other Mother        Deceased   Healthy Sister    Healthy Brother    Social History   Socioeconomic History   Marital status: Married    Spouse name: Not on file   Number of children: Not on file   Years of education: Not on file   Highest education level: Not on file  Occupational History   Occupation: retired    Comment: Publishing rights manager strain: Not on file   Food insecurity    Worry: Not on file    Inability: Not on file   Transportation needs    Medical: Not on file    Non-medical: Not on file  Tobacco Use   Smoking status: Former Smoker    Packs/day: 1.00    Years: 16.00    Pack years: 16.00    Types: Cigarettes    Quit date: 12/22/1993    Years since quitting: 25.3   Smokeless tobacco: Never Used  Substance and Sexual Activity   Alcohol use: No    Alcohol/week: 21.0 standard drinks  Types: 21 Standard drinks or equivalent per week    Comment: one time every few months now   Drug use: No   Sexual activity: Not on file  Lifestyle   Physical activity    Days per week: Not on file    Minutes per session: Not on file   Stress: Not on file  Relationships   Social connections    Talks on phone: Not on file    Gets together: Not on file    Attends religious service: Not on file    Active member of club or organization: Not on file    Attends meetings of clubs or organizations: Not on file    Relationship status: Not on file  Other Topics Concern   Not on file  Social History Narrative   Married 1981. Wife has kids-1 with 1 adopted grandchild.       Retired Tax adviser      Highest level of education:  B.S.      Hobbies: antiques, former Air cabin crew      Exercise: none currently.     Outpatient Encounter  Medications as of 04/14/2019  Medication Sig   carbidopa-levodopa (SINEMET IR) 25-100 MG tablet TAKE 2 TABLETS AT 7AM,11AM, 3PM,  7PM.   diphenhydrAMINE (BENADRYL) 25 mg capsule Take 25 mg by mouth at bedtime.   fesoterodine (TOVIAZ) 8 MG TB24 tablet Take 8 mg by mouth daily.   ibuprofen (ADVIL,MOTRIN) 200 MG tablet Take 400 mg by mouth every 6 (six) hours as needed for moderate pain.   mirabegron ER (MYRBETRIQ) 25 MG TB24 tablet Take 1 tablet (25 mg total) by mouth daily.   rosuvastatin (CRESTOR) 10 MG tablet Take 1 tablet (10 mg total) by mouth daily.   No facility-administered encounter medications on file as of 04/14/2019.     Activities of Daily Living In your present state of health, do you have any difficulty performing the following activities: 04/14/2019  Hearing? N  Vision? N  Difficulty concentrating or making decisions? N  Walking or climbing stairs? Y  Dressing or bathing? Y  Comment compression hose and shoes  Doing errands, shopping? Y  Preparing Food and eating ? N  Using the Toilet? N  In the past six months, have you accidently leaked urine? Y  Comment currently taking Toviaz  Do you have problems with loss of bowel control? N  Managing your Medications? N  Managing your Finances? N  Housekeeping or managing your Housekeeping? N  Some recent data might be hidden    Patient Care Team: [name], MD as PCP - General (Family Medicine) [name], MD as Consulting Physician (Dermatology) [name], MD as Consulting Physician (Ophthalmology) [name], MD as Consulting Physician (Orthopedic Surgery) [name], DDS as Referring Physician (Dentistry) [name], PT as Physical Therapist (Physical Therapy) [name], [name], DO as Consulting Physician (Neurology) [name] as Consulting Physician (Optometry)   Assessment:   This is a routine wellness examination for [name].  Exercise Activities and Dietary  recommendations Current Exercise Habits: The patient does not participate in regular exercise at present  Goals     Maintain current health status.        Fall Risk Fall Risk  04/14/2019 10/25/2018 05/30/2018 01/08/2018 08/07/2017  Falls in the past year? 1 1 1  Yes No  Number falls in past yr: 1 0 1 2 or more -  Injury with Fall? 0 1 1 No -  Risk Factor Category  - - -  High Fall Risk -  Risk for fall due to : Impaired mobility;Impaired balance/gait - History of fall(s);Impaired balance/gait - -  Follow up Education provided;Falls prevention discussed;Falls evaluation completed Falls evaluation completed Falls evaluation completed Falls evaluation completed -   Is the patient's home free of loose throw rugs in walkways, pet beds, electrical cords, etc?   yes      Grab bars in the bathroom? yes      Handrails on the stairs?   yes      Adequate lighting?   yes  Timed Get Up and Go Performed: unable to complete   Depression Screen PHQ 2/9 Scores 04/14/2019 01/07/2019 06/19/2018 02/07/2017  PHQ - 2 Score 0 0 0 0  PHQ- 9 Score - 0 - -    Cognitive Function- no cognitive concerns at this time      6CIT Screen 04/14/2019  What Year? 0 points  What month? 0 points  What time? 0 points  Count back from 20 0 points  Months in reverse 0 points  Repeat phrase 0 points  Total Score 0    Immunization History  Administered Date(s) Administered   Fluad Quad(high Dose 65+) 04/14/2019   Influenza Split 04/24/2011, 04/24/2012   Influenza Whole 05/19/2008, 04/15/2010   Influenza, High Dose Seasonal PF 05/19/2013, 05/03/2016, 05/07/2017, 05/07/2018   Influenza,inj,Quad PF,6+ Mos 04/29/2014, 05/04/2015   Pneumococcal Conjugate-13 12/30/2014   Pneumococcal Polysaccharide-23 04/24/2011   Td 07/24/2001   Tdap 08/02/2012   Zoster 04/24/2011    Qualifies for Shingles Vaccine? Discussed and patient will check with pharmacy for coverage.  Patient education handout provided    Screening  Tests Health Maintenance  Topic Date Due   COLONOSCOPY  04/10/2019   Hepatitis C Screening  07/21/2098 (Originally Apr 21, 1946)   TETANUS/TDAP  08/02/2022   INFLUENZA VACCINE  Completed   PNA vac Low Risk Adult  Completed   Cancer Screenings: Lung: Low Dose CT Chest recommended if Age 79-80 years, 30 pack-year currently smoking OR have quit w/in 15years. Patient does not qualify. Colorectal: Patient would like to hold      Plan:  I have personally reviewed and addressed the Medicare Annual Wellness questionnaire and have noted the following in the patients chart:  A. Medical and social history B. Use of alcohol, tobacco or illicit drugs  C. Current medications and supplements D. Functional ability and status E.  Nutritional status F.  Physical activity G. Advance directives H. List of other physicians I.  Hospitalizations, surgeries, and ER visits in previous 12 months J.  [place] such as hearing and vision if needed, cognitive and depression L. Referrals, records requested, and appointments- none   In addition, I have reviewed and discussed with patient certain preventive protocols, quality metrics, and best practice recommendations. A written personalized care plan for preventive services as well as general preventive health recommendations were provided to patient.   Signed,  Denman George, LPN  Nurse Health Advisor   Nurse Notes:  Patient with concerns on the cost of Toviaz being $90.  He has had 2 trials of Myrbetriq which he says is ineffective and causes dizziness.  I will look into if a tier exception is a possibility.  He is also looking for options for compression hose.  He is unable to don himself and his wife also struggles to assist.  He has assistive device from therapy but is still unable to manage with this.

## 2019-04-24 ENCOUNTER — Encounter: Payer: Self-pay | Admitting: Gastroenterology

## 2019-05-14 ENCOUNTER — Encounter: Payer: Self-pay | Admitting: Rehabilitation

## 2019-05-14 ENCOUNTER — Other Ambulatory Visit: Payer: Self-pay

## 2019-05-14 ENCOUNTER — Ambulatory Visit: Payer: PPO | Attending: Family Medicine | Admitting: Rehabilitation

## 2019-05-14 DIAGNOSIS — I890 Lymphedema, not elsewhere classified: Secondary | ICD-10-CM | POA: Diagnosis not present

## 2019-05-14 DIAGNOSIS — R2681 Unsteadiness on feet: Secondary | ICD-10-CM | POA: Insufficient documentation

## 2019-05-15 ENCOUNTER — Other Ambulatory Visit: Payer: Self-pay

## 2019-05-15 ENCOUNTER — Telehealth: Payer: Self-pay

## 2019-05-15 DIAGNOSIS — I890 Lymphedema, not elsewhere classified: Secondary | ICD-10-CM

## 2019-05-15 NOTE — Telephone Encounter (Signed)
Returned Rose call and placed referral for pt.

## 2019-05-15 NOTE — Telephone Encounter (Signed)
Copied from [place] [id number]. Topic: Referral - Status >> May 15, 2019  8:58 AM Lennox Solders wrote: Reason for CRM: rosa with outpt cancer rehab is calling and needs more referral for this patient dx lymphedema for 3 x a wk

## 2019-05-15 NOTE — Therapy (Signed)
[place], [place], [address] Phone: [phone]   Fax:  [phone]  Physical Therapy Evaluation  Patient Details  Name: Nicholas [name] MRN: [id number] Date of Birth: 1945-08-23 Referring Provider (PT): Dr. [name]   Encounter Date: 05/14/2019  PT End of Session - 05/14/19 1651    Visit Number  1   16th visit of the year; KX needed   Number of Visits  25    Date for PT Re-Evaluation  07/09/19    Authorization Type  [employer] (Medicare)-will need 10th visit progress note    PT Start Time  1550    PT Stop Time  1640    PT Time Calculation (min)  50 min    Activity Tolerance  Patient tolerated treatment well    Behavior During Therapy  [facility] for tasks assessed/performed       Past Medical History:  Diagnosis Date  . 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 Hyperlipidemia   . Hypertension   . Lymphedema     Past Surgical History:  Procedure Laterality Date  . 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 removal of melanoma of left foot/amputation of toes 1&25 Jul 2006  . WRIST FRACTURE SURGERY     73 years old-set    There were no vitals filed for this visit.   Subjective Assessment - 05/14/19 1554    Subjective  Pt returns after Discharge reporting that they just can't do things at home.  My wife just can't help me get these things on.  I have been wearing my tributes 1-2x per week and wearing a circular 53mmHg every day because that is all we can get on.  I just got out of the machine.  I have also been sleeping in the stockings because I can't get them off.    Pertinent History  Lymphedema-has compression machine and uses stockings (wife helps); hx of PD/Parkinson's Plus Syndrone; malignant melanoma, hyperlipidemia, HTN, insomnia, obesity, hyperglycemia, dizziness, OSA, urinary urgency            LYMPHEDEMA/ONCOLOGY QUESTIONNAIRE -  05/14/19 1649      Type   Cancer Type  melanoma between toes 2 and 3 of left foot       Surgeries   Other Surgery Date  07/24/06    Number Lymph Nodes Removed  5      Treatment   Past Chemotherapy Treatment  Yes    Date  12/23/06      What other symptoms do you have   Are you Having Heaviness or Tightness  Yes    Are you having Pain  No    Are you having pitting edema  Yes    Body Site  both lower legs     Is it Hard or Difficult finding clothes that fit  Yes    Do you have infections  No    Stemmer Sign  Yes      Lymphedema Stage   Stage  STAGE 2 SPONTANEOUSLY IRREVERSIBLE      Right Lower Extremity Lymphedema   20 cm Proximal to Suprapatella  66.5 cm    10 cm Proximal to Suprapatella  62.7 cm    At Midpatella/Popliteal Crease  61.5 cm    30 cm Proximal to Floor at Lateral Plantar Foot  59 cm    20 cm Proximal to Floor at  Lateral Plantar Foot  53 1    10 cm Proximal to Floor at Lateral Malleoli  44.3 cm   ankle crease   5 cm Proximal to 1st MTP Joint  36.3 cm    Across MTP Joint  33.5 cm    Around Proximal Great Toe  9.8 cm      Left Lower Extremity Lymphedema   20 cm Proximal to Suprapatella  66 cm    10 cm Proximal to Suprapatella  63.2 cm    At Midpatella/Popliteal Crease  61.5 cm    30 cm Proximal to Floor at Lateral Plantar Foot  58 cm    20 cm Proximal to Floor at Lateral Plantar Foot  53.8 cm    10 cm Proximal to Floor at Lateral Malleoli  40.2 cm    5 cm Proximal to 1st MTP Joint  33.5 cm    Across MTP Joint  32.4 cm    Around Proximal Great Toe  10.5 cm             Outpatient [facility]  Lymphedema Life Impact Scale Total Score  14.71 %      Objective measurements completed on examination: See above findings.              PT Education - 05/14/19 1651    Education Details  new POC to include bandaging    Person(s) Educated  Patient;Spouse    Methods  Explanation     Comprehension  Verbalized understanding       PT Short Term Goals - 07/02/18 2049      Additional Short Term Goals   Additional Short Term Goals  Yes      PT SHORT TERM GOAL #6   Title  (fir lymphedema) Pt will have system in place for manage of wound drainage and promotion of wound healing     Time  4    Period  Weeks    Status  New        PT Long Term Goals - 05/15/19 1152      PT LONG TERM GOAL #1   Title  Pt will decrease lymphedema volume in bilateral LEs to allow pt and wife to donn stockings at home for continued self care    Time  8    Period  Weeks    Status  New             Plan - 05/14/19 1654    Clinical Impression Statement  Pt returns to therapy today with evident increase in the size of bilateral LEs.  He was discharged on the last visit in July, so a new prescription will be requested from the MD for continued treatment, but pt and wife are now willing to complete CDT of the LEs to try to have ease of donning his garments, and improved mobility.  Pt was DCd with flat knit garments, bilateral tribute to the knee, a medi big butler and his compression pump, but he and his wife are unable to use the garments due to back and other health issues.  Discussed DC options for this time with patient and wife wanting to see how it goes but most likely velcro garments.  Pt does exhibit lymphedema into bilateral thighs so compression shorts were discussed which he already has.  Pt denies any other CHF or kidney related symptoms    Personal Factors and Comorbidities  Age;Fitness;Comorbidity 2    Comorbidities  limited mobility, lymph node removal    Stability/Clinical Decision Making  Evolving/Moderate complexity    Clinical Decision Making  Moderate    Rehab Potential  Fair    Clinical Impairments Affecting Rehab Potential  history of parkinsons diseae with limitations in ablilty to put on compression garments     PT Frequency  3x / week    PT Duration  8 weeks    PT  Treatment/Interventions  ADLs/Self Care Home Management;Therapeutic activities;Therapeutic exercise;Patient/family education;Orthotic Fit/Training;Manual techniques;Vasopneumatic Device;Compression bandaging;Manual lymph drainage;Functional mobility training;DME Instruction    PT Next Visit Plan  begin CDT single LE Rtvs Lt?,  teach remedial exercises as able.    Consulted and Agree with Plan of Care  Patient    Family Member Consulted  wife       Patient will benefit from skilled therapeutic intervention in order to improve the following deficits and impairments:  Decreased skin integrity, Postural dysfunction, Obesity, Increased edema, Difficulty walking, Decreased mobility  Visit Diagnosis: Lymphedema, not elsewhere classified  Unsteadiness on feet     Problem List Patient Active Problem List   Diagnosis Date Noted  . Strain of right biceps 09/12/2017  . Lymphedema 02/10/2017  . BPH associated with nocturia 05/16/2016  . Senile purpura ([place]) 05/03/2016  . OSA (obstructive sleep apnea) 12/28/2015  . Hypersomnia 11/15/2015  . Cough 11/15/2015  . 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 Dizziness 12/30/2014  . Diastolic dysfunction 123456  . Former smoker 04/29/2014  . Chronic venous insufficiency 02/20/2014  . Hyperglycemia 08/02/2012  . Morbid obesity ([place]) 07/13/2010  . History of malignant melanoma. left leg 03/09/2009  . Insomnia 10/03/2007  . Hyperlipidemia 12/13/2006  . Essential hypertension 12/13/2006    [name] 05/15/2019, 11:54 AM  [place], [place], [address] Phone: [phone]   Fax:  [phone]  Name: Nicholas [name] MRN: [id number] Date of Birth: Jan 17, 1946

## 2019-05-21 ENCOUNTER — Ambulatory Visit: Payer: PPO

## 2019-05-26 ENCOUNTER — Encounter: Payer: Self-pay | Admitting: Physical Therapy

## 2019-05-26 ENCOUNTER — Other Ambulatory Visit: Payer: Self-pay

## 2019-05-26 ENCOUNTER — Ambulatory Visit: Payer: PPO | Attending: Family Medicine | Admitting: Physical Therapy

## 2019-05-26 DIAGNOSIS — M6281 Muscle weakness (generalized): Secondary | ICD-10-CM | POA: Insufficient documentation

## 2019-05-26 DIAGNOSIS — I890 Lymphedema, not elsewhere classified: Secondary | ICD-10-CM

## 2019-05-26 DIAGNOSIS — R2681 Unsteadiness on feet: Secondary | ICD-10-CM | POA: Diagnosis not present

## 2019-05-26 DIAGNOSIS — R2689 Other abnormalities of gait and mobility: Secondary | ICD-10-CM | POA: Insufficient documentation

## 2019-05-26 NOTE — Therapy (Signed)
[place], [place], [address] Phone: [phone]   Fax:  [phone]  Physical Therapy Treatment  Patient Details  Name: Nicholas [name] MRN: [id number] Date of Birth: March 29, 1946 Referring Provider (PT): Dr. [name]   Encounter Date: 05/26/2019  PT End of Session - 05/26/19 1357    Visit Number  2   17th visit of year, needs kx   Number of Visits  25    Date for PT Re-Evaluation  07/09/19    Authorization Type  [employer] (Medicare)-will need 10th visit progress note    PT Start Time  1335    PT Stop Time  1352    PT Time Calculation (min)  17 min    Activity Tolerance  Patient tolerated treatment well    Behavior During Therapy  [facility] for tasks assessed/performed       Past Medical History:  Diagnosis Date  . 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 Hyperlipidemia   . Hypertension   . Lymphedema     Past Surgical History:  Procedure Laterality Date  . 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 removal of melanoma of left foot/amputation of toes 1&25 Jul 2006  . WRIST FRACTURE SURGERY     73 years old-set    There were no vitals filed for this visit.  Subjective Assessment - 05/26/19 1335    Subjective  My leg was itching last night. I want to start with bandaging the right leg.    Pertinent History  Lymphedema-has compression machine and uses stockings (wife helps); hx of PD/Parkinson's Plus Syndrone; malignant melanoma, hyperlipidemia, HTN, insomnia, obesity, hyperglycemia, dizziness, OSA, urinary urgency    Patient Stated Goals  get back in stockings and to put his his shoes on    Currently in Pain?  No/denies                  [facility]  Lymphedema Life Impact Scale Total Score  14.71 %           [facility] Adult PT Treatment/Exercise - 05/26/19 0001       Manual Therapy   Compression Bandaging  Rt LE: thick stockinette, toes with mollelast 1-4, artiflex with extra padding around the ankle to adjust foot shape as well as 1/2" gray wheels to bil malleoli, then 1 6cm foot, 1 8cm, and 1- 10cm bandage, and 2 - 12 cm bandages applied from ankle to knee ,blue shoe cover on top of bandages               PT Short Term Goals - 07/02/18 2049      Additional Short Term Goals   Additional Short Term Goals  Yes      PT SHORT TERM GOAL #6   Title  (fir lymphedema) Pt will have system in place for manage of wound drainage and promotion of wound healing     Time  4    Period  Weeks    Status  New        PT Long Term Goals - 05/15/19 1152      PT LONG TERM GOAL #1   Title  Pt will decrease lymphedema volume in bilateral LEs to allow pt and wife to donn stockings at home for continued self care    Time  8  Period  Weeks    Status  New            Plan - 05/26/19 1357    Clinical Impression Statement  Pt only seen for brief time today secondary to original PT having to leave early so was seen by another PT. Did not have time to do MLD today but did begin compression bandaging to RLE from foot to knee. Pt's wife attended session to see how bandaging was performed. Educated pt to attempt to leave bandages on until appointment on Wednesday unless he needs to remove them.    Clinical Impairments Affecting Rehab Potential  history of parkinsons diseae with limitations in ablilty to put on compression garments     PT Frequency  3x / week    PT Duration  8 weeks    PT Treatment/Interventions  ADLs/Self Care Home Management;Therapeutic activities;Therapeutic exercise;Patient/family education;Orthotic Fit/Training;Manual techniques;Vasopneumatic Device;Compression bandaging;Manual lymph drainage;Functional mobility training;DME Instruction    PT Next Visit Plan  continue CDT to R LE with bandaging from foot to knee, teach remedial exercises as  able.    Consulted and Agree with Plan of Care  Patient       Patient will benefit from skilled therapeutic intervention in order to improve the following deficits and impairments:  Decreased skin integrity, Postural dysfunction, Obesity, Increased edema, Difficulty walking, Decreased mobility  Visit Diagnosis: Lymphedema, not elsewhere classified     Problem List Patient Active Problem List   Diagnosis Date Noted  . Strain of right biceps 09/12/2017  . Lymphedema 02/10/2017  . BPH associated with nocturia 05/16/2016  . Senile purpura ([place]) 05/03/2016  . OSA (obstructive sleep apnea) 12/28/2015  . Hypersomnia 11/15/2015  . Cough 11/15/2015  . 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 Dizziness 12/30/2014  . Diastolic dysfunction 123456  . Former smoker 04/29/2014  . Chronic venous insufficiency 02/20/2014  . Hyperglycemia 08/02/2012  . Morbid obesity ([place]) 07/13/2010  . History of malignant melanoma. left leg 03/09/2009  . Insomnia 10/03/2007  . Hyperlipidemia 12/13/2006  . Essential hypertension 12/13/2006    Allyson Sabal [facility] 05/26/2019, 2:00 PM  [place], [place], [address] Phone: [phone]   Fax:  [phone]  Name: Nicholas [name] MRN: [id number] Date of Birth: 1946-01-25  Manus Gunning, PT 05/26/19 2:00 PM

## 2019-05-28 ENCOUNTER — Ambulatory Visit: Payer: PPO

## 2019-05-28 ENCOUNTER — Other Ambulatory Visit: Payer: Self-pay

## 2019-05-28 DIAGNOSIS — I890 Lymphedema, not elsewhere classified: Secondary | ICD-10-CM | POA: Diagnosis not present

## 2019-05-28 DIAGNOSIS — M6281 Muscle weakness (generalized): Secondary | ICD-10-CM

## 2019-05-28 DIAGNOSIS — R2689 Other abnormalities of gait and mobility: Secondary | ICD-10-CM

## 2019-05-28 NOTE — Therapy (Signed)
[place], [place], [address] Phone: [phone]   Fax:  [phone]  Physical Therapy Treatment  Patient Details  Name: Nicholas [name] MRN: [id number] Date of Birth: 1946-01-07 Referring Provider (PT): Dr. [name]   Encounter Date: 05/28/2019  PT End of Session - 05/28/19 1110    Visit Number  3    Number of Visits  25    Date for PT Re-Evaluation  07/09/19    PT Start Time  1109    PT Stop Time  1158    PT Time Calculation (min)  49 min    Activity Tolerance  Patient tolerated treatment well    Behavior During Therapy  [facility] for tasks assessed/performed       Past Medical History:  Diagnosis Date  . 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 Hyperlipidemia   . Hypertension   . Lymphedema     Past Surgical History:  Procedure Laterality Date  . 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 removal of melanoma of left foot/amputation of toes 1&25 Jul 2006  . WRIST FRACTURE SURGERY     73 years old-set    There were no vitals filed for this visit.  Subjective Assessment - 05/28/19 1112    Pertinent History  Lymphedema-has compression machine and uses stockings (wife helps); hx of PD/Parkinson's Plus Syndrone; malignant melanoma, hyperlipidemia, HTN, insomnia, obesity, hyperglycemia, dizziness, OSA, urinary urgency    Patient Stated Goals  get back in stockings and to put his his shoes on                  Outpatient Rehab from 07/02/2018 in [place]  Lymphedema Life Impact Scale Total Score  14.71 %           [facility] Adult PT Treatment/Exercise - 05/28/19 0001      Manual Therapy   Manual Therapy  Manual Lymphatic Drainage (MLD);Compression Bandaging    Manual Lymphatic Drainage (MLD)  In supine: short neck, 5 diaphragmatic breathes, Bil axillary nodes, Bil inguinal nodes, R inguino-axillary  anastomosis, R LE from medial-lateral upper thigh then lateral upper thigh, then distal to proximal dowh the RLE to the dorsum of the foot. re-working all surfaces.     Compression Bandaging  Rt LE: thick stockinette, toes with mollelast 1-4, artiflex with extra padding around the ankle to adjust foot shape as well as 1/2" gray wheels to bil malleoli, then 1 6cm foot, 1 8cm, and 1- 10cm bandage, and 2 - 12 cm bandages applied from ankle to knee ,ABD pad on dorsum of foot and over anterior tibialis crest.              PT Education - 05/28/19 1158    Education Details  Re-iterated education on reasons to remove bandaging including aching, throbbing, tingling    Person(s) Educated  Patient;Spouse    Methods  Explanation    Comprehension  Verbalized understanding          PT Long Term Goals - 05/15/19 1152      PT LONG TERM GOAL #1   Title  Pt will decrease lymphedema volume in bilateral LEs to allow pt and wife to donn stockings at home for continued self care    Time  8    Period  Weeks    Status  New  Plan - 05/28/19 1110    Clinical Impression Statement  Pt presents to physical therapy without any skin issues from preivous wrap. MLD wasperformed on the anterior trunk and RLE from foot to knee. Discussed in the depth the importance of bicycle shorts in order to add compression to the R thigh and abodmen to clear fluid for lymphatic flow. Re-iterated educated on reasons to remove wraps. Discussed MLD sequencing with spouse throughtout and re-iterated education on the anatomy and physiology of the lymphatic system. Pt will benefit from continued POC.    Personal Factors and Comorbidities  Age;Fitness;Comorbidity 2    Comorbidities  limited mobility, lymph node removal    Clinical Impairments Affecting Rehab Potential  history of parkinsons diseae with limitations in ablilty to put on compression garments     PT Frequency  3x / week    PT Duration  8 weeks    PT  Treatment/Interventions  ADLs/Self Care Home Management;Therapeutic activities;Therapeutic exercise;Patient/family education;Orthotic Fit/Training;Manual techniques;Vasopneumatic Device;Compression bandaging;Manual lymph drainage;Functional mobility training;DME Instruction    PT Next Visit Plan  continue CDT to R LE with bandaging from foot to knee, teach remedial exercises as able.    Consulted and Agree with Plan of Care  Patient    Family Member Consulted  wife       Patient will benefit from skilled therapeutic intervention in order to improve the following deficits and impairments:  Decreased skin integrity, Postural dysfunction, Obesity, Increased edema, Difficulty walking, Decreased mobility  Visit Diagnosis: Lymphedema, not elsewhere classified  Muscle weakness (generalized)  Other abnormalities of gait and mobility     Problem List Patient Active Problem List   Diagnosis Date Noted  . Strain of right biceps 09/12/2017  . Lymphedema 02/10/2017  . BPH associated with nocturia 05/16/2016  . Senile [name] ([place]) 05/03/2016  . OSA (obstructive sleep apnea) 12/28/2015  . Hypersomnia 11/15/2015  . Cough 11/15/2015  . 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 Dizziness 12/30/2014  . Diastolic dysfunction 123456  . Former smoker 04/29/2014  . Chronic venous insufficiency 02/20/2014  . Hyperglycemia 08/02/2012  . Morbid obesity ([place]) 07/13/2010  . History of malignant melanoma. left leg 03/09/2009  . Insomnia 10/03/2007  . Hyperlipidemia 12/13/2006  . Essential hypertension 12/13/2006    Nicholas [name], PT 05/28/2019, 12:01 PM  [place], [place], [address] Phone: [phone]   Fax:  [phone]  Name: Nicholas [name] MRN: [id number] Date of Birth: June 05, 1946

## 2019-05-30 ENCOUNTER — Other Ambulatory Visit: Payer: Self-pay

## 2019-05-30 ENCOUNTER — Ambulatory Visit: Payer: PPO

## 2019-05-30 DIAGNOSIS — M6281 Muscle weakness (generalized): Secondary | ICD-10-CM

## 2019-05-30 DIAGNOSIS — I890 Lymphedema, not elsewhere classified: Secondary | ICD-10-CM | POA: Diagnosis not present

## 2019-05-30 DIAGNOSIS — R2681 Unsteadiness on feet: Secondary | ICD-10-CM

## 2019-05-30 DIAGNOSIS — R2689 Other abnormalities of gait and mobility: Secondary | ICD-10-CM

## 2019-05-30 NOTE — Therapy (Signed)
[place], [place], [address] Phone: [phone]   Fax:  [phone]  Physical Therapy Treatment  Patient Details  Name: Nicholas [name] MRN: [id number] Date of Birth: 09/10/1945 Referring Provider (PT): Dr. [name]   Encounter Date: 05/30/2019  PT End of Session - 05/30/19 1118    Visit Number  4    Number of Visits  25    Date for PT Re-Evaluation  07/09/19    Authorization Type  [employer] (Medicare)-will need 10th visit progress note    PT Start Time  1115    PT Stop Time  1208    PT Time Calculation (min)  53 min    Activity Tolerance  Patient tolerated treatment well    Behavior During Therapy  [facility] for tasks assessed/performed       Past Medical History:  Diagnosis Date  . 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 Hyperlipidemia   . Hypertension   . Lymphedema     Past Surgical History:  Procedure Laterality Date  . 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 removal of melanoma of left foot/amputation of toes 1&25 Jul 2006  . WRIST FRACTURE SURGERY     73 years old-set    There were no vitals filed for this visit.  Subjective Assessment - 05/30/19 1119    Subjective  Pt reports that he is feeling pretty good he has no pain and his bandage did well.    Pertinent History  Lymphedema-has compression machine and uses stockings (wife helps); hx of PD/Parkinson's Plus Syndrone; malignant melanoma, hyperlipidemia, HTN, insomnia, obesity, hyperglycemia, dizziness, OSA, urinary urgency    Patient Stated Goals  get back in stockings and to put his his shoes on    Currently in Pain?  No/denies                  [facility]  Lymphedema Life Impact Scale Total Score  14.71 %           [facility] Adult PT Treatment/Exercise - 05/30/19 0001      Manual Therapy   Manual Therapy  Manual Lymphatic Drainage (MLD);Compression Bandaging    Manual Lymphatic Drainage (MLD)  In supine: short neck, 5 diaphragmatic breathes, Bil axillary nodes, Bil inguinal nodes, R inguino-axillary anastomosis, R LE from medial-lateral upper thigh then lateral upper thigh, then distal to proximal dowh the RLE to the dorsum of the foot. re-working all surfaces.     Compression Bandaging  Rt LE: washed with warm water then lotion applied; thick stockinette, toes with mollelast 1-4, artiflex with extra padding around the ankle to adjust foot shape as well as 1/2" gray wheels to bil malleoli and dense orange wavy foam at the lateral malleoli for fibrosis, then 1 6cm foot, 1 8cm, and 1- 10cm bandage, and 1 - 12 cm bandages applied from ankle to knee ,ABD pad on dorsum of foot folded this session for greater pressure.              PT Education - 05/30/19 1207    Education Details  Re-iterated education on reasons to remove the bandage and to wear compression bicycle shorts to help with edema in the abdomen and thighs. Discussed the use of powder at the distal bottom part of the abdomen due to maceration.    Person(s) Educated  Patient;Spouse    Methods  Explanation    Comprehension  Verbalized understanding          PT Long Term Goals - 05/15/19 1152      PT LONG TERM GOAL #1   Title  Pt will decrease lymphedema volume in bilateral LEs to allow pt and wife to donn stockings at home for continued self care    Time  8    Period  Weeks    Status  New            Plan - 05/30/19 1118    Clinical Impression Statement  Pt presents to physical therapy with significant reduction in the toes this session and no skin issues from previous wrap. Continued with significant edema at the R lateral malleoli added dense orange wavy foam this session to break up fibrosis. MLD performed to the anterior trunk and RLE from foot to knee prior to compression bandaging. Re-iterated education  from last session on the importance of using biycle shorts in order to add compresso to the thighs and abodmen in order to clear lymphatic fluid for improved lymphatic flow and decreased risk of fluid building up in Bil thighs. Pt will benefit from continued POC.    Personal Factors and Comorbidities  Age;Fitness;Comorbidity 2    Comorbidities  limited mobility, lymph node removal    Rehab Potential  Fair    Clinical Impairments Affecting Rehab Potential  history of parkinsons diseae with limitations in ablilty to put on compression garments     PT Frequency  3x / week    PT Duration  8 weeks    PT Treatment/Interventions  ADLs/Self Care Home Management;Therapeutic activities;Therapeutic exercise;Patient/family education;Orthotic Fit/Training;Manual techniques;Vasopneumatic Device;Compression bandaging;Manual lymph drainage;Functional mobility training;DME Instruction    PT Next Visit Plan  continue CDT to R LE with bandaging from foot to knee, teach remedial exercises as able.    Consulted and Agree with Plan of Care  Patient    Family Member Consulted  wife       Patient will benefit from skilled therapeutic intervention in order to improve the following deficits and impairments:  Decreased skin integrity, Postural dysfunction, Obesity, Increased edema, Difficulty walking, Decreased mobility  Visit Diagnosis: Lymphedema, not elsewhere classified  Other abnormalities of gait and mobility  Unsteadiness on feet  Muscle weakness (generalized)     Problem List Patient Active Problem List   Diagnosis Date Noted  . Strain of right biceps 09/12/2017  . Lymphedema 02/10/2017  . BPH associated with nocturia 05/16/2016  . Senile purpura ([place]) 05/03/2016  . OSA (obstructive sleep apnea) 12/28/2015  . Hypersomnia 11/15/2015  . Cough 11/15/2015  . 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 Dizziness 12/30/2014  . Diastolic dysfunction 123456  . Former smoker 04/29/2014  . Chronic  venous insufficiency 02/20/2014  . Hyperglycemia 08/02/2012  . Morbid obesity ([place]) 07/13/2010  . History of malignant melanoma. left leg 03/09/2009  . Insomnia 10/03/2007  . Hyperlipidemia 12/13/2006  . Essential hypertension 12/13/2006    Ander Purpura, PT 05/30/2019, 12:13 PM  [place], [place], [address] Phone: [phone]   Fax:  [phone]  Name: Nicholas [name] MRN: [id number] Date of Birth: [birth year]

## 2019-06-02 ENCOUNTER — Encounter: Payer: Self-pay | Admitting: Rehabilitation

## 2019-06-02 ENCOUNTER — Other Ambulatory Visit: Payer: Self-pay

## 2019-06-02 ENCOUNTER — Ambulatory Visit: Payer: PPO | Admitting: Rehabilitation

## 2019-06-02 DIAGNOSIS — R2689 Other abnormalities of gait and mobility: Secondary | ICD-10-CM

## 2019-06-02 DIAGNOSIS — R2681 Unsteadiness on feet: Secondary | ICD-10-CM

## 2019-06-02 DIAGNOSIS — I890 Lymphedema, not elsewhere classified: Secondary | ICD-10-CM | POA: Diagnosis not present

## 2019-06-02 DIAGNOSIS — M6281 Muscle weakness (generalized): Secondary | ICD-10-CM

## 2019-06-02 NOTE — Therapy (Signed)
[place], [place], [address] Phone: [phone]   Fax:  [phone]  Physical Therapy Treatment  Patient Details  Name: Nicholas [name] MRN: [id number] Date of Birth: 02/02/1946 Referring Provider (PT): Dr. [name]   Encounter Date: 06/02/2019  PT End of Session - 06/02/19 1738    Visit Number  5    Number of Visits  25    Date for PT Re-Evaluation  07/09/19    PT Start Time  [id number]    PT Stop Time  1602    PT Time Calculation (min)  47 min    Activity Tolerance  Patient tolerated treatment well    Behavior During Therapy  [facility] for tasks assessed/performed       Past Medical History:  Diagnosis Date  . 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 Hyperlipidemia   . Hypertension   . Lymphedema     Past Surgical History:  Procedure Laterality Date  . 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 removal of melanoma of left foot/amputation of toes 1&25 Jul 2006  . WRIST FRACTURE SURGERY     73 years old-set    There were no vitals filed for this visit.  Subjective Assessment - 06/02/19 1731    Subjective  I think it is going down    Pertinent History  Lymphedema-has compression machine and uses stockings (wife helps); hx of PD/Parkinson's Plus Syndrone; malignant melanoma, hyperlipidemia, HTN, insomnia, obesity, hyperglycemia, dizziness, OSA, urinary urgency    Patient Stated Goals  get back in stockings and to put his his shoes on    Currently in Pain?  No/denies            LYMPHEDEMA/ONCOLOGY QUESTIONNAIRE - 06/02/19 1526      Right Lower Extremity Lymphedema   10 cm Proximal to Suprapatella  73 cm    At Midpatella/Popliteal Crease  60.5 cm    30 cm Proximal to Floor at Lateral Plantar Foot  55.9 cm    20 cm Proximal to Floor at Lateral Plantar Foot  49.2 1    10  cm Proximal to Floor at Lateral Malleoli  39.9 cm    5 cm Proximal to 1st MTP  Joint  31.8 cm    Across MTP Joint  30 cm    Around Proximal Great Toe  9 cm           Outpatient Re[facility] from [facility]02/[facility]  Lymphedema Life Impact Scale Total Score  14.71 %           [facility] Adult PT Treatment/Exercise - 06/02/19 0001      Manual Therapy   Edema Management  remeasured LE    Manual Lymphatic Drainage (MLD)  In supine: Rt axillary nodes, Rt inguinal nodes, Rt inguino-axillary anastomosis, R LE from medial-lateral upper thigh then lateral upper thigh, then distal to proximal dowh the RLE to the dorsum of the foot. re-working all surfaces.     Compression Bandaging  Rt LE: washed with warm water then lotion applied; thick stockinette, toes with mollelast 1-3, artiflex with extra padding around the ankle to adjust foot shape as well as 1/2" gray wheels to bil malleoli and dense orange wavy foam at the lateral malleoli for fibrosis, then 1 6cm foot, 1 8cm, and 1- 10cm bandage, and 1 - 12 cm bandages applied from ankle to knee ,  ABD pad on dorsum of foot folded this session for greater pressure.                PT Short Term Goals - 07/02/18 2049      Additional Short Term Goals   Additional Short Term Goals  Yes      PT SHORT TERM GOAL #6   Title  (fir lymphedema) Pt will have system in place for manage of wound drainage and promotion of wound healing     Time  4    Period  Weeks    Status  New        PT Long Term Goals - 06/02/19 1742      PT LONG TERM GOAL #1   Title  Pt will decrease lymphedema volume in bilateral LEs to allow pt and wife to donn stockings at home for continued self care    Status  On-going            Plan - 06/02/19 1740    Clinical Impression Statement  measurements taken today with global reduction in the Rt LE foot and lower leg even mildly in the thigh despite not bandaging thigh high.  Skin is tolerating bandaging well and pt is still able to remain mobile.    PT Frequency   3x / week    PT Duration  8 weeks    PT Treatment/Interventions  ADLs/Self Care Home Management;Therapeutic activities;Therapeutic exercise;Patient/family education;Orthotic Fit/Training;Manual techniques;Vasopneumatic Device;Compression bandaging;Manual lymph drainage;Functional mobility training;DME Instruction    PT Next Visit Plan  continue CDT to R LE with bandaging from foot to knee, teach remedial exercises as able.    Consulted and Agree with Plan of Care  Patient    Family Member Consulted  wife       Patient will benefit from skilled therapeutic intervention in order to improve the following deficits and impairments:     Visit Diagnosis: Lymphedema, not elsewhere classified  Unsteadiness on feet  Other abnormalities of gait and mobility  Muscle weakness (generalized)     Problem List Patient Active Problem List   Diagnosis Date Noted  . Strain of right biceps 09/12/2017  . Lymphedema 02/10/2017  . BPH associated with nocturia 05/16/2016  . Senile purpura ([place]) 05/03/2016  . OSA (obstructive sleep apnea) 12/28/2015  . Hypersomnia 11/15/2015  . Cough 11/15/2015  . 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 Dizziness 12/30/2014  . Diastolic dysfunction 123456  . Former smoker 04/29/2014  . Chronic venous insufficiency 02/20/2014  . Hyperglycemia 08/02/2012  . Morbid obesity ([place]) 07/13/2010  . History of malignant melanoma. left leg 03/09/2009  . Insomnia 10/03/2007  . Hyperlipidemia 12/13/2006  . Essential hypertension 12/13/2006    [name] 06/02/2019, 5:42 PM  [place], [place], [address] Phone: [phone]   Fax:  [phone]  Name: Nicholas [name] MRN: [id number] Date of Birth: 05-11-1946

## 2019-06-04 ENCOUNTER — Encounter: Payer: PPO | Admitting: Rehabilitation

## 2019-06-06 ENCOUNTER — Ambulatory Visit: Payer: PPO | Admitting: Rehabilitation

## 2019-06-06 ENCOUNTER — Other Ambulatory Visit: Payer: Self-pay

## 2019-06-06 ENCOUNTER — Encounter: Payer: Self-pay | Admitting: Rehabilitation

## 2019-06-06 DIAGNOSIS — I890 Lymphedema, not elsewhere classified: Secondary | ICD-10-CM

## 2019-06-06 DIAGNOSIS — R2681 Unsteadiness on feet: Secondary | ICD-10-CM

## 2019-06-06 DIAGNOSIS — M6281 Muscle weakness (generalized): Secondary | ICD-10-CM

## 2019-06-06 DIAGNOSIS — R2689 Other abnormalities of gait and mobility: Secondary | ICD-10-CM

## 2019-06-06 NOTE — Therapy (Signed)
[place], [place], [address] Phone: [phone]   Fax:  [phone]  Physical Therapy Treatment  Patient Details  Name: Nicholas [name] MRN: [id number] Date of Birth: [birth year] Referring Provider (PT): Dr. [name]   Encounter Date: 06/06/2019  PT End of Session - 06/06/19 1107    Visit Number  6    Number of Visits  25    Date for PT Re-Evaluation  07/09/19    Authorization Type  [employer] (Medicare)-will need 10th visit progress note    PT Start Time  1000    PT Stop Time  1055    PT Time Calculation (min)  55 min    Activity Tolerance  Patient tolerated treatment well    Behavior During Therapy  [facility] for tasks assessed/performed       Past Medical History:  Diagnosis Date  . 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 Hyperlipidemia   . Hypertension   . Lymphedema     Past Surgical History:  Procedure Laterality Date  . 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 removal of melanoma of left foot/amputation of toes 1&25 Jul 2006  . WRIST FRACTURE SURGERY     73 years old-set    There were no vitals filed for this visit.  Subjective Assessment - 06/06/19 1103    Subjective  i kept this on since monday. It seemed okay.  I think I will have a friend help me put stockings on when this is over.  My shorts do not fit.    Pertinent History  Lymphedema-has compression machine and uses stockings (wife helps); hx of PD/Parkinson's Plus Syndrone; malignant melanoma, hyperlipidemia, HTN, insomnia, obesity, hyperglycemia, dizziness, OSA, urinary urgency    Patient Stated Goals  get back in stockings and to put his his shoes on    Currently in Pain?  No/denies                  [facility]  Lymphedema Life Impact Scale Total Score  14.71 %           [facility] Adult PT  Treatment/Exercise - 06/06/19 0001      Manual Therapy   Manual therapy comments  continued with long discussion about plan after DC.  Pt wants to get back in regular stockings.  Reinforced need to get bike shorts that fit as pt does not want to wrap the gthigh or use abdominal binder.  Pt also does not want to purchase anything else.      Manual Lymphatic Drainage (MLD)  In supine: superficial and deep abdominals, Rt axillary nodes, Rt inguinal nodes, Rt inguino-axillary anastomosis, R LE from medial-lateral upper thigh then lateral upper thigh, then distal to proximal dowh the RLE to the dorsum of the foot. re-working all surfaces.     Compression Bandaging  Rt LE: washed with warm water then lotion applied; thick stockinette, toes with mollelast 1-4, 1/2" gray foam dorsal foot and malleoli wheels, artiflex with extra padding around the ankle to adjust foot shape, then 1 6cm foot making sure to remove air from foam on application, 1 8cm, and 1- 10cm bandage, applied from ankle to knee               PT Short Term Goals - 07/02/18 2049      Additional Short  Term Goals   Additional Short Term Goals  Yes      PT SHORT TERM GOAL #6   Title  (fir lymphedema) Pt will have system in place for manage of wound drainage and promotion of wound healing     Time  4    Period  Weeks    Status  New        PT Long Term Goals - 06/02/19 1742      PT LONG TERM GOAL #1   Title  Pt will decrease lymphedema volume in bilateral LEs to allow pt and wife to donn stockings at home for continued self care    Status  On-going            Plan - 06/06/19 1107    Clinical Impression Statement  Pt with increase in lateral ankle edema due to wearing the same bandage for 1 week but overall skin is much softer.  Pt does have some increased thigh edema with bandaging but pt refuses to wrap thigh.  Also reports his shorts are too small so he was encouraged to buy shorts today.  Continued CDT adding 1/2"  gray foam to the top of the foot and focused on pressure gradient.    PT Frequency  3x / week    PT Duration  8 weeks    PT Treatment/Interventions  ADLs/Self Care Home Management;Therapeutic activities;Therapeutic exercise;Patient/family education;Orthotic Fit/Training;Manual techniques;Vasopneumatic Device;Compression bandaging;Manual lymph drainage;Functional mobility training;DME Instruction    Consulted and Agree with Plan of Care  Patient    Family Member Consulted  wife       Patient will benefit from skilled therapeutic intervention in order to improve the following deficits and impairments:     Visit Diagnosis: Lymphedema, not elsewhere classified  Unsteadiness on feet  Other abnormalities of gait and mobility  Muscle weakness (generalized)     Problem List Patient Active Problem List   Diagnosis Date Noted  . Strain of right biceps 09/12/2017  . Lymphedema 02/10/2017  . BPH associated with nocturia 05/16/2016  . Senile purpura ([place]) 05/03/2016  . OSA (obstructive sleep apnea) 12/28/2015  . Hypersomnia 11/15/2015  . Cough 11/15/2015  . 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 Dizziness 12/30/2014  . Diastolic dysfunction 123456  . Former smoker 04/29/2014  . Chronic venous insufficiency 02/20/2014  . Hyperglycemia 08/02/2012  . Morbid obesity ([place]) 07/13/2010  . History of malignant melanoma. left leg 03/09/2009  . Insomnia 10/03/2007  . Hyperlipidemia 12/13/2006  . Essential hypertension 12/13/2006    [name] 06/06/2019, 11:10 AM  [place], [place], [address] Phone: [phone]   Fax:  [phone]  Name: Nicholas [name] MRN: [id number] Date of Birth: Apr 22, 1946

## 2019-06-09 ENCOUNTER — Other Ambulatory Visit: Payer: Self-pay

## 2019-06-09 ENCOUNTER — Ambulatory Visit: Payer: PPO

## 2019-06-09 DIAGNOSIS — I890 Lymphedema, not elsewhere classified: Secondary | ICD-10-CM

## 2019-06-09 DIAGNOSIS — M6281 Muscle weakness (generalized): Secondary | ICD-10-CM

## 2019-06-09 DIAGNOSIS — R2681 Unsteadiness on feet: Secondary | ICD-10-CM

## 2019-06-09 DIAGNOSIS — R2689 Other abnormalities of gait and mobility: Secondary | ICD-10-CM

## 2019-06-09 NOTE — Therapy (Signed)
[place], [place], [address] Phone: [phone]   Fax:  [phone]  Physical Therapy Treatment  Patient Details  Name: Nicholas [name] MRN: [id number] Date of Birth: November 10, 1945 Referring Provider (PT): Dr. [name]   Encounter Date: 06/09/2019  PT End of Session - 06/09/19 1552    Visit Number  7    Number of Visits  25    Date for PT Re-Evaluation  07/09/19    Authorization Type  [employer] (Medicare)-will need 10th visit progress note    PT Start Time  1500    PT Stop Time  1553    PT Time Calculation (min)  53 min    Activity Tolerance  Patient tolerated treatment well    Behavior During Therapy  [facility] for tasks assessed/performed       Past Medical History:  Diagnosis Date  . 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 Hyperlipidemia   . Hypertension   . Lymphedema     Past Surgical History:  Procedure Laterality Date  . 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 removal of melanoma of left foot/amputation of toes 1&25 Jul 2006  . WRIST FRACTURE SURGERY     73 years old-set    There were no vitals filed for this visit.  Subjective Assessment - 06/09/19 1554    Subjective  Pt states that he has no pain and that he has been putting a slightly compressive sock on his L lower leg.    Pertinent History  Lymphedema-has compression machine and uses stockings (wife helps); hx of PD/Parkinson's Plus Syndrone; malignant melanoma, hyperlipidemia, HTN, insomnia, obesity, hyperglycemia, dizziness, OSA, urinary urgency    Patient Stated Goals  get back in stockings and to put his his shoes on    Currently in Pain?  No/denies                  Outpatient Re[facility]  Lymphedema Life Impact Scale Total Score  14.71 %           [facility] Adult PT Treatment/Exercise - 06/09/19 0001       Manual Therapy   Manual Therapy  Manual Lymphatic Drainage (MLD);Compression Bandaging    Manual Lymphatic Drainage (MLD)  In supine: superficial and deep abdominals, Bil axillary nodes, Bil inguinal nodes, Rt inguino-axillary anastomosis, R LE from medial-lateral upper thigh then lateral upper thigh, then distal to proximal dowh the RLE to the dorsum of the foot. Then re-tracing all steps.     Compression Bandaging  Rt LE: washed with warm water then lotion applied; thick stockinette, toes with mollelast 1-4, 1/2" gray foam and abd pad on dorsal foot and malleoli wheels, artiflex with extra padding around the ankle to adjust foot shape, then 1 6cm foot making sure to remove air from foam on application, 1 8cm, and 1- 10cm bandage, applied from ankle to knee             PT Education - 06/09/19 1552    Education Details  Re-iterated educationon reasons to remove wrap and the importance of compression on the thighs in order to decrease fluid build up in the lower legs using some type of light compression shorts or pants. Pt was also educated that he needs a velcro wrap that is donned consistently and getting a friend to do this would be  challenging. He will bring his velcro wraps next session to assess.    Person(s) Educated  Patient    Methods  Explanation    Comprehension  Verbalized understanding          PT Long Term Goals - 06/02/19 1742      PT LONG TERM GOAL #1   Title  Pt will decrease lymphedema volume in bilateral LEs to allow pt and wife to donn stockings at home for continued self care    Status  On-going            Plan - 06/09/19 1551    Clinical Impression Statement  Pt continues with edema over Bil malleoli worse overall, added abd pad to the dorsum of the foot this session along with gray foam in order to improve fluid flow out of the lower leg. Pt continues to refuse to wrap thigh and has not yet purchased compressive shorts. Pt will benefit from continued  POC.    Personal Factors and Comorbidities  Age;Fitness;Comorbidity 2    Comorbidities  limited mobility, lymph node removal    Clinical Impairments Affecting Rehab Potential  history of parkinsons diseae with limitations in ablilty to put on compression garments     PT Frequency  3x / week    PT Duration  8 weeks    PT Treatment/Interventions  ADLs/Self Care Home Management;Therapeutic activities;Therapeutic exercise;Patient/family education;Orthotic Fit/Training;Manual techniques;Vasopneumatic Device;Compression bandaging;Manual lymph drainage;Functional mobility training;DME Instruction    PT Next Visit Plan  continue CDT to R LE with bandaging from foot to knee, teach remedial exercises as able. Assess velcro wraps.    Consulted and Agree with Plan of Care  Patient    Family Member Consulted  wife       Patient will benefit from skilled therapeutic intervention in order to improve the following deficits and impairments:  Decreased skin integrity, Postural dysfunction, Obesity, Increased edema, Difficulty walking, Decreased mobility  Visit Diagnosis: Lymphedema, not elsewhere classified  Unsteadiness on feet  Other abnormalities of gait and mobility  Muscle weakness (generalized)     Problem List Patient Active Problem List   Diagnosis Date Noted  . Strain of right biceps 09/12/2017  . Lymphedema 02/10/2017  . BPH associated with nocturia 05/16/2016  . Senile purpura ([place]) 05/03/2016  . OSA (obstructive sleep apnea) 12/28/2015  . Hypersomnia 11/15/2015  . Cough 11/15/2015  . 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 Dizziness 12/30/2014  . Diastolic dysfunction [id number]  . Former smoker 04/29/2014  . Chronic venous insufficiency 02/20/2014  . Hyperglycemia 08/02/2012  . Morbid obesity ([place]) 07/13/2010  . History of malignant melanoma. left leg 03/09/2009  . Insomnia 10/03/2007  . Hyperlipidemia 12/13/2006  . Essential hypertension 12/13/2006    Ander Purpura, PT 06/09/2019, 4:01 PM  [place], [place], [address] Phone: [phone]   Fax:  [phone]  Name: SEDERICK LASTINGER MRN: [id number] Date of Birth: [birth year]

## 2019-06-11 ENCOUNTER — Ambulatory Visit: Payer: PPO

## 2019-06-11 ENCOUNTER — Other Ambulatory Visit: Payer: Self-pay

## 2019-06-11 DIAGNOSIS — R2689 Other abnormalities of gait and mobility: Secondary | ICD-10-CM

## 2019-06-11 DIAGNOSIS — R2681 Unsteadiness on feet: Secondary | ICD-10-CM

## 2019-06-11 DIAGNOSIS — I890 Lymphedema, not elsewhere classified: Secondary | ICD-10-CM

## 2019-06-11 DIAGNOSIS — M6281 Muscle weakness (generalized): Secondary | ICD-10-CM

## 2019-06-11 NOTE — Therapy (Signed)
[place], [place], [address] Phone: [phone]   Fax:  [phone]  Physical Therapy Treatment  Patient Details  Name: Nicholas [name] MRN: [id number] Date of Birth: 1946-02-09 Referring Provider (PT): Dr. [name]   Encounter Date: 06/11/2019  PT End of Session - 06/11/19 1623    Visit Number  8    Number of Visits  25    Date for PT Re-Evaluation  07/09/19    Authorization Type  [employer] (Medicare)-will need 10th visit progress note    PT Start Time  1615    PT Stop Time  1710    PT Time Calculation (min)  55 min    Activity Tolerance  Patient tolerated treatment well    Behavior During Therapy  [facility] for tasks assessed/performed       Past Medical History:  Diagnosis Date  . 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 Hyperlipidemia   . Hypertension   . Lymphedema     Past Surgical History:  Procedure Laterality Date  . 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 removal of melanoma of left foot/amputation of toes 1&25 Jul 2006  . WRIST FRACTURE SURGERY     73 years old-set    There were no vitals filed for this visit.  Subjective Assessment - 06/11/19 1624    Subjective  Pt reports that he brought his velcro wraps for me to assess today.He states that he has left his wrap on since 11/16 and has had no difficulties with it. Pt states that he was up late last night and then got up early this morning which is causing him to have a bad day an he is moving slowly.    Pertinent History  Lymphedema-has compression machine and uses stockings (wife helps); hx of PD/Parkinson's Plus Syndrone; malignant melanoma, hyperlipidemia, HTN, insomnia, obesity, hyperglycemia, dizziness, OSA, urinary urgency    Patient Stated Goals  get back in stockings and to put his his shoes on    Currently in Pain?  No/denies                  Outpatient Rehab  from [facility]  Lymphedema Life Impact Scale Total Score  14.71 %           [facility] Adult PT Treatment/Exercise - 06/11/19 0001      Manual Therapy   Manual Therapy  Compression Bandaging    Manual therapy comments  Pt was educated on donning the RLE reduction kit during the day and the night garment at night. His spouse will assist him. also discussed that he can wear his reduction kit at night if he is experiencing difficulty donning/doffing garments.     Compression Bandaging  Rt LE; washed and reduction kit with foot piece was applied to the lower leg. Lt LE; thick stockinette, toes with mollelast 1-4, 1/2" gray foam and abd pad on dorsal foot and malleoli wheels, artiflex with extra padding around the ankle to adjust foot shape, then 1 6cm foot making sure to remove air from foam on application, 1 8cm, and 1- 10cm bandage, applied from ankle to knee             PT Education - 06/11/19 1712    Education Details  Pt brought in his garments today he has multiple garments. His RLE reduction kit now fits  with tribute night garment slightly too small but not enough to cause restriction or tourniquet. pt was advised to wear the reduction kit during the day and the tribute night garment at night for fibrosis on the RLE. LLE was unable to fit any of the garments and was wrapped this session. He appeared wearing his bicyle shorts and was encouraged to conintue wearing these. re-iterated education on reasons to remove the compression wrap on the LLE.    Person(s) Educated  Patient    Methods  Explanation;Demonstration    Comprehension  Verbalized understanding          PT Long Term Goals - 06/02/19 1742      PT LONG TERM GOAL #1   Title  Pt will decrease lymphedema volume in bilateral LEs to allow pt and wife to donn stockings at home for continued self care    Status  On-going            Plan - 06/11/19 1623    Clinical  Impression Statement  Pt required significant extra time to walk to treatment room today putting him 15 min late. Pt presented with multiple wraps/night garments in a bag. He was able to fit garments on the RLE but was unable to fit any on the L. The reduction kit was applied to the RLE and the LLE was wrapped due to significant increase in edema in the LLE at this time. Discussed use of the tribute at night and reduction kit during the day applying the same rules/principles as when to remove the compression wrap to the velcro wraps and night garment. Pt will benefit from continued POC.    Personal Factors and Comorbidities  Age;Fitness;Comorbidity 2    Comorbidities  limited mobility, lymph node removal    Rehab Potential  Fair    Clinical Impairments Affecting Rehab Potential  history of parkinsons diseae with limitations in ablilty to put on compression garments     PT Frequency  3x / week    PT Duration  8 weeks    PT Treatment/Interventions  ADLs/Self Care Home Management;Therapeutic activities;Therapeutic exercise;Patient/family education;Orthotic Fit/Training;Manual techniques;Vasopneumatic Device;Compression bandaging;Manual lymph drainage;Functional mobility training;DME Instruction    PT Next Visit Plan  assess RLE reduction kit and LLE wrap, continue CDT to L LE with bandaging from foot to knee, teach remedial exercises as able.    Consulted and Agree with Plan of Care  Patient    Family Member Consulted  wife       Patient will benefit from skilled therapeutic intervention in order to improve the following deficits and impairments:  Decreased skin integrity, Postural dysfunction, Obesity, Increased edema, Difficulty walking, Decreased mobility  Visit Diagnosis: Lymphedema, not elsewhere classified  Unsteadiness on feet  Other abnormalities of gait and mobility  Muscle weakness (generalized)     Problem List Patient Active Problem List   Diagnosis Date Noted  . Strain of  right biceps 09/12/2017  . Lymphedema 02/10/2017  . BPH associated with nocturia 05/16/2016  . Senile purpura ([place]) 05/03/2016  . OSA (obstructive sleep apnea) 12/28/2015  . Hypersomnia 11/15/2015  . Cough 11/15/2015  . 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 Dizziness 12/30/2014  . Diastolic dysfunction 41/58/3094  . Former smoker 04/29/2014  . Chronic venous insufficiency 02/20/2014  . Hyperglycemia 08/02/2012  . Morbid obesity ([place]) 07/13/2010  . History of malignant melanoma. left leg 03/09/2009  . Insomnia 10/03/2007  . Hyperlipidemia 12/13/2006  . Essential hypertension 12/13/2006    Ander Purpura,  PT 06/11/2019, 5:17 PM  [place], [place], [address] Phone: [phone]   Fax:  [phone]  Name: DATRELL DUNTON MRN: [id number] Date of Birth: 10-01-1945

## 2019-06-13 ENCOUNTER — Ambulatory Visit: Payer: PPO | Admitting: Rehabilitation

## 2019-06-13 ENCOUNTER — Encounter: Payer: Self-pay | Admitting: Rehabilitation

## 2019-06-13 ENCOUNTER — Other Ambulatory Visit: Payer: Self-pay

## 2019-06-13 DIAGNOSIS — R2681 Unsteadiness on feet: Secondary | ICD-10-CM

## 2019-06-13 DIAGNOSIS — I890 Lymphedema, not elsewhere classified: Secondary | ICD-10-CM

## 2019-06-13 NOTE — Therapy (Signed)
[place], [place], [address] Phone: [phone]   Fax:  [phone]  Physical Therapy Treatment  Patient Details  Name: Nicholas [name] MRN: [id number] Date of Birth: [birth year] Referring Provider (PT): Dr. [name]   Encounter Date: 06/13/2019  PT End of Session - 06/13/19 1200    Visit Number  9    Number of Visits  25    Date for PT Re-Evaluation  07/09/19    PT Start Time  1103    PT Stop Time  1156    PT Time Calculation (min)  53 min    Activity Tolerance  Patient tolerated treatment well    Behavior During Therapy  [facility] for tasks assessed/performed       Past Medical History:  Diagnosis Date  . 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 Hyperlipidemia   . Hypertension   . Lymphedema     Past Surgical History:  Procedure Laterality Date  . 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 removal of melanoma of left foot/amputation of toes 1&25 Jul 2006  . WRIST FRACTURE SURGERY     73 years old-set    There were no vitals filed for this visit.  Subjective Assessment - 06/13/19 1158    Subjective  everything went well    Pertinent History  Lymphedema-has compression machine and uses stockings (wife helps); hx of PD/Parkinson's Plus Syndrone; malignant melanoma, hyperlipidemia, HTN, insomnia, obesity, hyperglycemia, dizziness, OSA, urinary urgency    Currently in Pain?  No/denies            LYMPHEDEMA/ONCOLOGY QUESTIONNAIRE - 06/13/19 1115      Left Lower Extremity Lymphedema   At Midpatella/Popliteal Crease  59 cm    30 cm Proximal to Floor at Lateral Plantar Foot  55.2 cm    20 cm Proximal to Floor at Lateral Plantar Foot  51.6 cm    10 cm Proximal to Floor at Lateral Malleoli  39 cm    5 cm Proximal to 1st MTP Joint  31.8 cm    Across MTP Joint  31.3 cm    Around Proximal Great Toe  10 cm           Outpatient Rehab from  [facility]/02/[facility]  Lymphedema Life Impact Scale Total Score  14.71 %           [facility] Adult PT Treatment/Exercise - 06/13/19 0001      Manual Therapy   Edema Management  remeasured left LE which has already decreased in size since last measurements    Manual Lymphatic Drainage (MLD)  In supine: superficial and deep abdominals, left axillary nodes, left inguinal nodes, left inguino-axillary anastomosis, L LE from medial-lateral upper thigh then lateral upper thigh, then distal to proximal down to the dorsum of the foot. Then re-tracing all steps.     Compression Bandaging  Rt LE;  reduction kit with foot piece was re-applied to the lower leg. Lt LE; thick stockinette, toes with mollelast 1, 4-5, 1/2" gray foam and abd pad on dorsal foot and malleoli wheels, artiflex with extra padding around the ankle to adjust foot shape, then 1 6cm foot making sure to remove air from foam on application, 1 8cm, and 2- 10cm bandage, applied from ankle to knee               PT Short  Term Goals - 07/02/18 2049      Additional Short Term Goals   Additional Short Term Goals  Yes      PT SHORT TERM GOAL #6   Title  (fir lymphedema) Pt will have system in place for manage of wound drainage and promotion of wound healing     Time  4    Period  Weeks    Status  New        PT Long Term Goals - 06/02/19 1742      PT LONG TERM GOAL #1   Title  Pt will decrease lymphedema volume in bilateral LEs to allow pt and wife to donn stockings at home for continued self care    Status  On-going            Plan - 06/13/19 1201    Clinical Impression Statement  Pt tolerated wrapping of left LE well.  Had visible reduction and decrease in measurements and pts wife was able to put on his Rt circaid well this morning.  Pt was given lymphapress card to see if there is a way to use the pump with only 1 leg running.  Will cont POC monitoring the Rt LE for control and  to decrease the Lt LE size.    PT Frequency  3x / week    PT Duration  8 weeks    PT Treatment/Interventions  ADLs/Self Care Home Management;Therapeutic activities;Therapeutic exercise;Patient/family education;Orthotic Fit/Training;Manual techniques;Vasopneumatic Device;Compression bandaging;Manual lymph drainage;Functional mobility training;DME Instruction    PT Next Visit Plan  assess RLE reduction kit and LLE wrap, continue CDT to L LE with bandaging from foot to knee, teach remedial exercises as able.    Consulted and Agree with Plan of Care  Patient       Patient will benefit from skilled therapeutic intervention in order to improve the following deficits and impairments:     Visit Diagnosis: Lymphedema, not elsewhere classified  Unsteadiness on feet     Problem List Patient Active Problem List   Diagnosis Date Noted  . Strain of right biceps 09/12/2017  . Lymphedema 02/10/2017  . BPH associated with nocturia 05/16/2016  . Senile purpura ([place]) 05/03/2016  . OSA (obstructive sleep apnea) 12/28/2015  . Hypersomnia 11/15/2015  . Cough 11/15/2015  . 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 Dizziness 12/30/2014  . Diastolic dysfunction 79/81/0254  . Former smoker 04/29/2014  . Chronic venous insufficiency 02/20/2014  . Hyperglycemia 08/02/2012  . Morbid obesity ([place]) 07/13/2010  . History of malignant melanoma. left leg 03/09/2009  . Insomnia 10/03/2007  . Hyperlipidemia 12/13/2006  . Essential hypertension 12/13/2006    [name] 06/13/2019, 12:03 PM  [place], [place], [address] Phone: [phone]   Fax:  [phone]  Name: Nicholas [name] MRN: [id number] Date of Birth: 10/15/1945

## 2019-06-16 ENCOUNTER — Other Ambulatory Visit: Payer: Self-pay

## 2019-06-16 ENCOUNTER — Ambulatory Visit: Payer: PPO

## 2019-06-16 DIAGNOSIS — I890 Lymphedema, not elsewhere classified: Secondary | ICD-10-CM | POA: Diagnosis not present

## 2019-06-16 DIAGNOSIS — M6281 Muscle weakness (generalized): Secondary | ICD-10-CM

## 2019-06-16 DIAGNOSIS — R2681 Unsteadiness on feet: Secondary | ICD-10-CM

## 2019-06-16 DIAGNOSIS — R2689 Other abnormalities of gait and mobility: Secondary | ICD-10-CM

## 2019-06-16 NOTE — Progress Notes (Signed)
Virtual Visit via Video Note The purpose of this virtual visit is to provide medical care while limiting exposure to the novel coronavirus.    Consent was obtained for video visit:  Yes.   Answered questions that patient had about telehealth interaction:  Yes.   I discussed the limitations, risks, security and privacy concerns of performing an evaluation and management service by telemedicine. I also discussed with the patient that there may be a patient responsible charge related to this service. The patient expressed understanding and agreed to proceed.  Pt location: Home Physician Location: office Name of referring provider:  [name], MD I connected with Nicholas [name] at patients initiation/request on 06/17/2019 at  3:00 PM EST by video enabled telemedicine application and verified that I am speaking with the correct person using two identifiers. Pt MRN:  [id number] Pt DOB:  1945/08/30 Video Participants:  Nicholas [name];     History of Present Illness:  Patient seen in follow-up for parkinsonism.  He is on carbidopa/levodopa 25/100, 2 tablets 4 times per day.  He has no side effects with the medication.  No falls.  Walks with the walker.  He is in enrolled in physical therapy, primarily for lymphedema.  Those notes have been reviewed.  He does have a history of melanoma.  His last dermatology visit was in May.  He sees the dermatologist yearly.  He saw his primary care physician through telemedicine on June 16.  That note was reviewed.  Not exercising.  Significant difficulty with getting up due to freezing.   Observations/Objective:   There were no vitals filed for this visit.  GEN:  The patient appears stated age and is in NAD.  Neurological examination:  Orientation: The patient is alert and oriented x3. Cranial nerves: There is good facial symmetry. There is facial hypomimia.  The speech is fluent and clear. Soft palate rises symmetrically and there is no tongue  deviation. Hearing is intact to conversational tone. Motor: Strength is at least antigravity x 4.   Shoulder shrug is equal and symmetric.  There is no pronator drift.  Movement examination: Tone: unable Abnormal movements: None Coordination:  There is mild decremation with RAM's, with finger taps bilaterally.  Could not see the feet good enough to evaluate them Gait and Station: The patient attempts to show me how he freezes, but when he tries to arise from the desk, he immediately freezes and cannot walk.  For safety reasons, I asked him to sit back down and not attempt to show me ambulation since I was not in the room with the patient, nor was anyone else.  Lab Results  Component Value Date   TSH 1.42 06/19/2018     Chemistry      Component Value Date/Time   NA 140 06/19/2018 1134   K 4.4 06/19/2018 1134   CL 105 06/19/2018 1134   CO2 30 06/19/2018 1134   BUN 23 06/19/2018 1134   CREATININE 1.03 06/19/2018 1134      Component Value Date/Time   CALCIUM 8.6 06/19/2018 1134   ALKPHOS 64 06/19/2018 1134   AST 9 06/19/2018 1134   ALT 5 06/19/2018 1134   BILITOT 0.5 06/19/2018 1134        Assessment and Plan:   1.  Idiopathic Parkinson's disease but I do think that PSP could be on the Ddx as he does have a fixed face stare/eyes.               -  We discussed the diagnosis as well as pathophysiology of the disease.  We discussed treatment options as well as prognostic indicators.  Patient education was provided.             -Continue carbidopa/levodopa 25/100, 2 tablets 4 times per day.    -Discussed in detail with the patient that I really thought it was time for a motorized scooter or motorized wheelchair.  He really is freezing a lot.  He stated that he was not ready for that, but we talked about the consequences of not being ready for that, including hip fracture/SDH.  He told me he would think about that.  He will let me know if he changes his mind and we can set up a wheelchair  evaluation. 2.  History of melanoma             -Follows yearly with dermatology.  Last appointment was in May. 3.  Vasomotor rhinorrhea             -mild and he is not ready for meds 4.  HTN             -now off meds and feels that he is doing well. Following with Dr. Yong [name] and his notes reviewed. 5.  Urinary incontinence             -Has a urologist.  Had side effects with Myrbetriq.  Follow Up Instructions:  5 months  -I discussed the assessment and treatment plan with the patient. The patient was provided an opportunity to ask questions and all were answered. The patient agreed with the plan and demonstrated an understanding of the instructions.   The patient was advised to call back or seek an in-person evaluation if the symptoms worsen or if the condition fails to improve as anticipated.    Total Time spent in visit with the patient was:  15 min, of which more than 50% of the time was spent in counseling and/or coordinating care on safety.   Pt understands and agrees with the plan of care outlined.     Nicholas Bogus, DO

## 2019-06-16 NOTE — Therapy (Signed)
[place], [place], [address] Phone: [phone]   Fax:  [phone]  Physical Therapy Progress Note  Progress Note Reporting Period 05/14/19  to 06/16/19  See note below for Objective Data and Assessment of Progress/Goals.       Patient Details  Name: Nicholas [name] MRN: [id number] Date of Birth: 04-26-1946 Referring Provider (PT): Dr. [name]   Encounter Date: 06/16/2019  PT End of Session - 06/16/19 1500    Visit Number  10    Number of Visits  25    Date for PT Re-Evaluation  07/28/19    Authorization Type  [employer] (Medicare)-will need 10th visit progress note    PT Start Time  1415    PT Stop Time  1500    PT Time Calculation (min)  45 min    Activity Tolerance  Patient tolerated treatment well    Behavior During Therapy  [facility] for tasks assessed/performed       Past Medical History:  Diagnosis Date  . 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 Hyperlipidemia   . Hypertension   . Lymphedema     Past Surgical History:  Procedure Laterality Date  . 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 removal of melanoma of left foot/amputation of toes 1&25 Jul 2006  . WRIST FRACTURE SURGERY     73 years old-set    There were no vitals filed for this visit.  Subjective Assessment - 06/16/19 1709    Subjective  Pt reports that he has been very slow lately and he has an appointment with his neurologist on Wednesday via skype.    Pertinent History  Lymphedema-has compression machine and uses stockings (wife helps); hx of PD/Parkinson's Plus Syndrone; malignant melanoma, hyperlipidemia, HTN, insomnia, obesity, hyperglycemia, dizziness, OSA, urinary urgency    Patient Stated Goals  get back in stockings and to put his his shoes on    Currently in Pain?  No/denies                  Outpatient Rehab from 07/02/2018 in [facility]  Lymphedema Life Impact Scale Total Score  14.71 %           [facility] Adult PT Treatment/Exercise - 06/16/19 0001      Manual Therapy   Manual Therapy  Compression Bandaging;Manual Lymphatic Drainage (MLD)    Manual Lymphatic Drainage (MLD)  In supine: superficial and deep abdominals, Bil axillary nodes, Bil inguinal nodes, left inguino-axillary anastomosis, L LE from medial-lateral upper thigh then lateral upper thigh, then bottle neck at the knee and posterior knee. Re-tracing all steps lower leg and dorsum of foot were not performed due to time constraints.     Compression Bandaging  Rt LE;  reduction kit with foot piece was re-applied to the lower leg and numbered for correct application at home. Lt LE; thick stockinette, toes with mollelast 1, 4-5, 1/2" gray foam and abd pad on dorsal foot and malleoli wheels, 1/2 inch gray foam with crease for tibial crest on anteiror portion of lower leg, artiflex with extra padding around the ankle to adjust foot shape, then 1 6cm foot making sure to remove air from foam on application, 1 8cm, and 2- 10cm bandage, applied from ankle to knee             PT Education - 06/16/19 1709  Education Details  Discussed correct application of the R circaid and pt agreed for it to be numbered to assist with correct application. Pt was encouraged to continue wearing his compression shorts.    Person(s) Educated  Patient    Methods  Explanation    Comprehension  Verbalized understanding          PT Long Term Goals - 06/16/19 1501      PT LONG TERM GOAL #1   Title  Pt will decrease lymphedema volume in bilateral LEs to allow pt and wife to donn stockings at home for continued self care    Baseline  Pt is able to don his reduction kit on his RLE with help of his spouse.    Time  6    Period  Weeks    Status  On-going    Target Date  07/28/19            Plan - 06/16/19 1500    Clinical Impression Statement  Pt  tolerated wrapping of the L LE well this session with MLD performed prior to wrapping to facilitate reduction. Pt R circaid was labeled with numbers this session due to straps were out of sequence resulting in gaps that could cause pockets of fluid build up. He has significantly reduced in his RLE since his initial evaluation. He is able to wear his circaid on the RLE and has purchased bicycle shorts but continues with difficulty wearing them consistently. Pt will benefit from physical therapy services 3x/week for 6 more weeks in order to decrease risk for infection and immobility.    Personal Factors and Comorbidities  Age;Fitness;Comorbidity 2    Comorbidities  limited mobility, lymph node removal    Clinical Impairments Affecting Rehab Potential  history of parkinsons disease with limitations in ablilty to put on compression garments    PT Frequency  3x / week    PT Duration  6 weeks    PT Treatment/Interventions  ADLs/Self Care Home Management;Therapeutic activities;Therapeutic exercise;Patient/family education;Orthotic Fit/Training;Manual techniques;Vasopneumatic Device;Compression bandaging;Manual lymph drainage;Functional mobility training;DME Instruction    PT Next Visit Plan  assess RLE reduction kit and LLE wrap, continue CDT to L LE with bandaging from foot to knee, teach LE lymphedema exercises.    Consulted and Agree with Plan of Care  Patient       Patient will benefit from skilled therapeutic intervention in order to improve the following deficits and impairments:  Decreased skin integrity, Postural dysfunction, Obesity, Increased edema, Difficulty walking, Decreased mobility  Visit Diagnosis: Lymphedema, not elsewhere classified  Unsteadiness on feet  Other abnormalities of gait and mobility  Muscle weakness (generalized)     Problem List Patient Active Problem List   Diagnosis Date Noted  . Strain of right biceps 09/12/2017  . Lymphedema 02/10/2017  . BPH associated  with nocturia 05/16/2016  . Senile purpura ([place]) 05/03/2016  . OSA (obstructive sleep apnea) 12/28/2015  . Hypersomnia 11/15/2015  . Cough 11/15/2015  . 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 Dizziness 12/30/2014  . Diastolic dysfunction 83/03/4075  . Former smoker 04/29/2014  . Chronic venous insufficiency 02/20/2014  . Hyperglycemia 08/02/2012  . Morbid obesity ([place]) 07/13/2010  . History of malignant melanoma. left leg 03/09/2009  . Insomnia 10/03/2007  . Hyperlipidemia 12/13/2006  . Essential hypertension 12/13/2006    Ander Purpura, PT 06/16/2019, 5:12 PM  [place], [place], [address] Phone: [phone]   Fax:  [phone]  Name:  RACHAEL ZAPANTA MRN: [id number] Date of Birth: 1945-10-05

## 2019-06-17 ENCOUNTER — Telehealth (INDEPENDENT_AMBULATORY_CARE_PROVIDER_SITE_OTHER): Payer: PPO | Admitting: Neurology

## 2019-06-17 ENCOUNTER — Encounter: Payer: Self-pay | Admitting: Neurology

## 2019-06-17 DIAGNOSIS — G20 Parkinson's disease: Secondary | ICD-10-CM

## 2019-06-18 ENCOUNTER — Ambulatory Visit: Payer: PPO | Admitting: Rehabilitation

## 2019-06-18 ENCOUNTER — Encounter: Payer: Self-pay | Admitting: Rehabilitation

## 2019-06-18 DIAGNOSIS — R2689 Other abnormalities of gait and mobility: Secondary | ICD-10-CM

## 2019-06-18 DIAGNOSIS — I890 Lymphedema, not elsewhere classified: Secondary | ICD-10-CM

## 2019-06-18 DIAGNOSIS — R2681 Unsteadiness on feet: Secondary | ICD-10-CM

## 2019-06-18 NOTE — Therapy (Signed)
[place], [place], [address] Phone: [phone]   Fax:  [phone]  Physical Therapy Treatment  Patient Details  Name: Nicholas [name] MRN: [id number] Date of Birth: Oct 30, 1945 Referring Provider (PT): Dr. [name]   Encounter Date: 06/18/2019  PT End of Session - 06/18/19 1213    Visit Number  11    Number of Visits  25    Date for PT Re-Evaluation  07/28/19    Authorization Type  [employer] (Medicare)-will need 10th visit progress note    PT Start Time  1102    PT Stop Time  1158    PT Time Calculation (min)  56 min    Activity Tolerance  Patient tolerated treatment well    Behavior During Therapy  [facility] for tasks assessed/performed       Past Medical History:  Diagnosis Date  . 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 Hyperlipidemia   . Hypertension   . Lymphedema     Past Surgical History:  Procedure Laterality Date  . 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 removal of melanoma of left foot/amputation of toes 1&25 Jul 2006  . WRIST FRACTURE SURGERY     73 years old-set    There were no vitals filed for this visit.  Subjective Assessment - 06/18/19 1208    Subjective  MD wants me to get a powered WC (pt arrives in push chair today) Can we wrap my Rt leg again and I will have my son help with my stockings    Pertinent History  Lymphedema-has compression machine and uses stockings (wife helps); hx of PD/Parkinson's Plus Syndrone; malignant melanoma, hyperlipidemia, HTN, insomnia, obesity, hyperglycemia, dizziness, OSA, urinary urgency    Patient Stated Goals  get back in stockings and to put his his shoes on    Currently in Pain?  No/denies            LYMPHEDEMA/ONCOLOGY QUESTIONNAIRE - 06/18/19 1115      Right Lower Extremity Lymphedema   30 cm Proximal to Floor at Lateral Plantar Foot  56.2 cm    20 cm Proximal to Floor at  Lateral Plantar Foot  49 1    10 cm Proximal to Floor at Lateral Malleoli  43 cm    5 cm Proximal to 1st MTP Joint  34.8 cm    Across MTP Joint  33 cm    Around Proximal Great Toe  9.6 cm      Left Lower Extremity Lymphedema   10 cm Proximal to Suprapatella  62.5 cm    At Midpatella/Popliteal Crease  53.7 cm    30 cm Proximal to Floor at Lateral Plantar Foot  50.9 cm    20 cm Proximal to Floor at Lateral Plantar Foot  46.6 cm    10 cm Proximal to Floor at Lateral Malleoli  35.7 cm    5 cm Proximal to 1st MTP Joint  28 cm    Across MTP Joint  27 cm    Around Proximal Great Toe  9.3 cm           Outpatient Rehab from 07/02/2018 in [facility] [facility]  Lymphedema Life Impact Scale Total Score  14.71 %           [facility] Adult PT Treatment/Exercise - 06/18/19 0001      Manual Therapy   Manual  therapy comments  reminded pt to remove bandage after 2 days then use home garments and pump.      Edema Management  remeasured bil LEs    Manual Lymphatic Drainage (MLD)  In supine: Rt axillary nodes, Rt inguinal nodes, Rt inguino-axillary anastomosis, Rt LE from medial-lateral upper thigh then lateral upper thigh, knee, lower leg and foot and then reversing all steps.     Compression Bandaging  Rt LE; thick stockinette, toes with mollelast 1-3, abd pad on dorsal foot and malleoli wheels, artiflex with extra padding around the ankle to adjust foot shape, then 1 6cm foot making sure to remove air from foam on application, 1 8cm, and 2- 10cm bandage, applied from ankle to knee, Lt LE: donned exostrong               PT Short Term Goals - 07/02/18 2049      Additional Short Term Goals   Additional Short Term Goals  Yes      PT SHORT TERM GOAL #6   Title  (fir lymphedema) Pt will have system in place for manage of wound drainage and promotion of wound healing     Time  4    Period  Weeks    Status  New        PT Long Term Goals - 06/16/19 1501       PT LONG TERM GOAL #1   Title  Pt will decrease lymphedema volume in bilateral LEs to allow pt and wife to donn stockings at home for continued self care    Baseline  Pt is able to don his reduction kit on his RLE with help of his spouse.    Time  6    Period  Weeks    Status  On-going    Target Date  07/28/19            Plan - 06/18/19 1218    Clinical Impression Statement  Pt with significant reduction of the left LE with almost 5cm global reduction and the Rt LE with 3cm increase in the foot and 10cm from the floor and around 1cm above this.  Pt requested stocking on the Lt LE as his son can help him and we wrapped the Rt LE again today due to increased size.  Pt will keep this on x 2 days and then use his pump and home garments.    PT Frequency  3x / week    PT Duration  6 weeks    PT Treatment/Interventions  ADLs/Self Care Home Management;Therapeutic activities;Therapeutic exercise;Patient/family education;Orthotic Fit/Training;Manual techniques;Vasopneumatic Device;Compression bandaging;Manual lymph drainage;Functional mobility training;DME Instruction    PT Next Visit Plan  how was left leg with just garments and pump, continue to wrap as needed,    Consulted and Agree with Plan of Care  Patient       Patient will benefit from skilled therapeutic intervention in order to improve the following deficits and impairments:     Visit Diagnosis: Lymphedema, not elsewhere classified  Unsteadiness on feet  Other abnormalities of gait and mobility     Problem List Patient Active Problem List   Diagnosis Date Noted  . Strain of right biceps 09/12/2017  . Lymphedema 02/10/2017  . BPH associated with nocturia 05/16/2016  . Senile purpura ([place]) 05/03/2016  . OSA (obstructive sleep apnea) 12/28/2015  . Hypersomnia 11/15/2015  . Cough 11/15/2015  . 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 Dizziness 12/30/2014  . Diastolic dysfunction 16/04/9603  .  Former smoker 04/29/2014   . Chronic venous insufficiency 02/20/2014  . Hyperglycemia 08/02/2012  . Morbid obesity ([place]) 07/13/2010  . History of malignant melanoma. left leg 03/09/2009  . Insomnia 10/03/2007  . Hyperlipidemia 12/13/2006  . Essential hypertension 12/13/2006    [name] 06/18/2019, 12:25 PM  [place], [place], [address] Phone: [phone]   Fax:  [phone]  Name: Nicholas [name] MRN: [id number] Date of Birth: [birth year]

## 2019-06-23 ENCOUNTER — Ambulatory Visit: Payer: PPO

## 2019-06-23 ENCOUNTER — Other Ambulatory Visit: Payer: Self-pay

## 2019-06-23 DIAGNOSIS — M6281 Muscle weakness (generalized): Secondary | ICD-10-CM

## 2019-06-23 DIAGNOSIS — I890 Lymphedema, not elsewhere classified: Secondary | ICD-10-CM

## 2019-06-23 DIAGNOSIS — R2681 Unsteadiness on feet: Secondary | ICD-10-CM

## 2019-06-23 DIAGNOSIS — R2689 Other abnormalities of gait and mobility: Secondary | ICD-10-CM

## 2019-06-23 NOTE — Therapy (Signed)
[place], [place], [address] Phone: [phone]   Fax:  [phone]  Physical Therapy Treatment  Patient Details  Name: Nicholas [name] MRN: [id number] Date of Birth: Jun 13, 1946 Referring Provider (PT): Dr. [name]   Encounter Date: 06/23/2019  PT End of Session - 06/23/19 1415    Visit Number  12    Number of Visits  25    Date for PT Re-Evaluation  07/28/19    Authorization Type  [employer] (Medicare)-will need 10th visit progress note    PT Start Time  1407    PT Stop Time  1500    PT Time Calculation (min)  53 min    Activity Tolerance  Patient tolerated treatment well    Behavior During Therapy  [facility] for tasks assessed/performed       Past Medical History:  Diagnosis Date  . 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 Hyperlipidemia   . Hypertension   . Lymphedema     Past Surgical History:  Procedure Laterality Date  . 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 removal of melanoma of left foot/amputation of toes 1&25 Jul 2006  . WRIST FRACTURE SURGERY     73 years old-set    There were no vitals filed for this visit.  Subjective Assessment - 06/23/19 1415    Subjective  Pt states that her got his grandson to help him don his compression garment on his LLE. He and his wife are unable to don it. He states that this is his oldest garment and is over 73 year old.    Pertinent History  Lymphedema-has compression machine and uses stockings (wife helps); hx of PD/Parkinson's Plus Syndrone; malignant melanoma, hyperlipidemia, HTN, insomnia, obesity, hyperglycemia, dizziness, OSA, urinary urgency    Patient Stated Goals  get back in stockings and to put his his shoes on    Currently in Pain?  No/denies                  Outpatient Re[facility] from [facility]02/2018 [facility]  Lymphedema Life Impact Scale Total  Score  14.71 %           [facility] Adult PT Treatment/Exercise - 06/23/19 0001      Manual Therapy   Manual Therapy  Manual Lymphatic Drainage (MLD);Compression Bandaging;Edema management    Manual therapy comments  Reminded pt to remove after 2 days and to continue with garment and pump    Edema Management  Was able to get exostrong garment on his LLE this session    Manual Lymphatic Drainage (MLD)  In supine: short neck, 5 diaphragmatic breathes, Bil axillary nodes, Bil inguinal nodes, Rt inguino axillary anastomosis, Rt LE from medial to lateral, lateral thigh, posterior knee, bottle neck of the knee, all sufaces of lower leg and dorsum of the foot working from proximal to distal then retracing all steps.     Compression Bandaging  Rt LE; thick lotion to the Rt leg, TG soft stockinette, 2 in elastomull folded for digits 1-5, Thin foam on dorsum of foot with ABD pad, 2 artiflex, 1-8 cm bandage, 1 10 cm and 2 12 cm short stretch bandages from foot to below the knee and 1/2 inch gray foam on anterior lower leg. Exostrong garment was applied to the LLE.  PT Education - 06/23/19 1631    Education Details  Pt educated to not wear her exostrong at night and just continue to wear his tribute at night on the LLE. He states that he wants to ask his doctor if he will qualify for some home help becuase his spouse is unable to help him don his garment. He is wearing compression shorts today and discussed removing his wrap after 2 days then using pump and tribute/stocking at home as long as it fits. He will not force any garments on his leg in order to prevent complications.    Person(s) Educated  Patient    Methods  Explanation    Comprehension  Verbalized understanding          PT Long Term Goals - 06/16/19 1501      PT LONG TERM GOAL #1   Title  Pt will decrease lymphedema volume in bilateral LEs to allow pt and wife to donn stockings at home for continued self care     Baseline  Pt is able to don his reduction kit on his RLE with help of his spouse.    Time  6    Period  Weeks    Status  On-going    Target Date  07/28/19            Plan - 06/23/19 1414    Clinical Impression Statement  Pt continues with edema in his Bil LE. He has a sock half on and half off of his L lower leg and discussed that he needs to get help consistently to con his garment or the swelling will keep coming back. Pt was assisted with donning his exostrong garment on his L lower leg and discussed that he needs a new garment due to this one is old and has lost a lot of the elasticity. MLD was performed for the anterior trunk and the RLE prior to compression wrapping. [employer] foam was added to the anterior leg and the dorsum of the foot for fibrosis with extra time taken to push the air out of the foam. ABD pad was used from dorsum of foot to ankle transition in order to facilitate fluid flow out of the R foot. Pt will benefit form continued POC at this time.    Personal Factors and Comorbidities  Age;Fitness;Comorbidity 2    Comorbidities  limited mobility, lymph node removal    Clinical Impairments Affecting Rehab Potential  history of parkinsons disease with limitations in ablilty to put on compression garments    PT Frequency  3x / week    PT Duration  6 weeks    PT Treatment/Interventions  ADLs/Self Care Home Management;Therapeutic activities;Therapeutic exercise;Patient/family education;Orthotic Fit/Training;Manual techniques;Vasopneumatic Device;Compression bandaging;Manual lymph drainage;Functional mobility training;DME Instruction    PT Next Visit Plan  Pt states that he was paying his grandson to put on his garment at home.  continue to wrap as needed, ask if he has anyone who can help him don compression.    Consulted and Agree with Plan of Care  Patient       Patient will benefit from skilled therapeutic intervention in order to improve the following deficits and impairments:   Decreased skin integrity, Postural dysfunction, Obesity, Increased edema, Difficulty walking, Decreased mobility  Visit Diagnosis: Lymphedema, not elsewhere classified  Unsteadiness on feet  Other abnormalities of gait and mobility  Muscle weakness (generalized)     Problem List Patient Active Problem List   Diagnosis Date Noted  . Strain  of right biceps 09/12/2017  . Lymphedema 02/10/2017  . BPH associated with nocturia 05/16/2016  . Senile purpura ([place]) 05/03/2016  . OSA (obstructive sleep apnea) 12/28/2015  . Hypersomnia 11/15/2015  . Cough 11/15/2015  . 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 Dizziness 12/30/2014  . Diastolic dysfunction 25/91/0289  . Former smoker 04/29/2014  . Chronic venous insufficiency 02/20/2014  . Hyperglycemia 08/02/2012  . Morbid obesity ([place]) 07/13/2010  . History of malignant melanoma. left leg 03/09/2009  . Insomnia 10/03/2007  . Hyperlipidemia 12/13/2006  . Essential hypertension 12/13/2006    Ander Purpura, PT 06/23/2019, 4:35 PM  [place], [place], [address] Phone: [phone]   Fax:  [phone]  Name: SIYON LINCK MRN: [id number] Date of Birth: [birth year]

## 2019-06-27 ENCOUNTER — Ambulatory Visit: Payer: PPO | Attending: Family Medicine | Admitting: Rehabilitation

## 2019-06-27 ENCOUNTER — Encounter: Payer: Self-pay | Admitting: Rehabilitation

## 2019-06-27 ENCOUNTER — Other Ambulatory Visit: Payer: Self-pay

## 2019-06-27 DIAGNOSIS — I890 Lymphedema, not elsewhere classified: Secondary | ICD-10-CM | POA: Diagnosis not present

## 2019-06-27 DIAGNOSIS — R2681 Unsteadiness on feet: Secondary | ICD-10-CM | POA: Diagnosis not present

## 2019-06-27 DIAGNOSIS — M6281 Muscle weakness (generalized): Secondary | ICD-10-CM | POA: Insufficient documentation

## 2019-06-27 DIAGNOSIS — R2689 Other abnormalities of gait and mobility: Secondary | ICD-10-CM | POA: Diagnosis not present

## 2019-06-27 NOTE — Therapy (Signed)
[place], [place], [address] Phone: [phone]   Fax:  [phone]  Physical Therapy Treatment  Patient Details  Name: Nicholas [name] MRN: [id number] Date of Birth: 01-16-1946 Referring Provider (PT): Dr. [name]   Encounter Date: 06/27/2019  PT End of Session - 06/27/19 1121    Visit Number  13    Number of Visits  25    Date for PT Re-Evaluation  07/28/19    PT Start Time  5852    PT Stop Time  1118    PT Time Calculation (min)  63 min    Behavior During Therapy  [facility] for tasks assessed/performed       Past Medical History:  Diagnosis Date  . 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 Hyperlipidemia   . Hypertension   . Lymphedema     Past Surgical History:  Procedure Laterality Date  . 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 removal of melanoma of left foot/amputation of toes 1&25 Jul 2006  . WRIST FRACTURE SURGERY     73 years old-set    There were no vitals filed for this visit.  Subjective Assessment - 06/27/19 1018    Subjective  I have done okay this week.  Lets wrap the right one and use the flat knit on the left    Pertinent History  Lymphedema-has compression machine and uses stockings (wife helps); hx of PD/Parkinson's Plus Syndrone; malignant melanoma, hyperlipidemia, HTN, insomnia, obesity, hyperglycemia, dizziness, OSA, urinary urgency    Patient Stated Goals  get back in stockings and to put his his shoes on    Currently in Pain?  No/denies            LYMPHEDEMA/ONCOLOGY QUESTIONNAIRE - 06/27/19 1023      Right Lower Extremity Lymphedema   30 cm Proximal to Floor at Lateral Plantar Foot  55.7 cm    20 cm Proximal to Floor at Lateral Plantar Foot  51 1    10 cm Proximal to Floor at Lateral Malleoli  43 cm    5 cm Proximal to 1st MTP Joint  34.5 cm    Across MTP Joint  33 cm    Around Proximal Great Toe  9.5 cm       Left Lower Extremity Lymphedema   At Midpatella/Popliteal Crease  54 cm    30 cm Proximal to Floor at Lateral Plantar Foot  55.4 cm    20 cm Proximal to Floor at Lateral Plantar Foot  51 cm    10 cm Proximal to Floor at Lateral Malleoli  38.5 cm    5 cm Proximal to 1st MTP Joint  33.2 cm    Across MTP Joint  33 cm    Around Proximal Great Toe  9.8 cm           [facility]  Lymphedema Life Impact Scale Total Score  14.71 %           [facility] Adult PT Treatment/Exercise - 06/27/19 0001      Manual Therapy   Manual therapy comments  measured bil LEs as left LE has returned to original sizes.  Discussed velcro wraps or a custom stocking being the only options for DC as his leg fills back up so quickly.  Dicussed home care visit with Dr. Yong Channel and pt  will do this     Compression Bandaging  Rt LE; thick lotion to the Rt leg, TG soft stockinette, 2 in elastomull folded for digits 1-5, Thin foam on dorsum of foot , 2 artiflex, 1-8 cm bandage, 1 10 cm and 2 12 cm short stretch bandages from foot to below the knee and 1/2 inch gray foam on anterior lower leg.                PT Short Term Goals - 07/02/18 2049      Additional Short Term Goals   Additional Short Term Goals  Yes      PT SHORT TERM GOAL #6   Title  (fir lymphedema) Pt will have system in place for manage of wound drainage and promotion of wound healing     Time  4    Period  Weeks    Status  New        PT Long Term Goals - 06/16/19 1501      PT LONG TERM GOAL #1   Title  Pt will decrease lymphedema volume in bilateral LEs to allow pt and wife to donn stockings at home for continued self care    Baseline  Pt is able to don his reduction kit on his RLE with help of his spouse.    Time  6    Period  Weeks    Status  On-going    Target Date  07/28/19            Plan - 06/27/19 1121    Clinical Impression Statement  Pt returns  with the left LE back to baseline wiht increase in around 5cm globally after decreasing this same amount.  Discussed that his exostrong is not enough to contain him and he will either need a custom stronger sock due to sizing or a velcro wrap.  We took measures and sent to [facility] for velcro recommendations but are thinking of the juzo wrap with pull tabs and easy on sleeve.    PT Frequency  3x / week    PT Duration  6 weeks    PT Treatment/Interventions  ADLs/Self Care Home Management;Therapeutic activities;Therapeutic exercise;Patient/family education;Orthotic Fit/Training;Manual techniques;Vasopneumatic Device;Compression bandaging;Manual lymph drainage;Functional mobility training;DME Instruction    PT Next Visit Plan  continue CDT, hear back from [facility] regarding ordering can order through [employer] online with code for 10% off    Consulted and Agree with Plan of Care  Patient       Patient will benefit from skilled therapeutic intervention in order to improve the following deficits and impairments:     Visit Diagnosis: Lymphedema, not elsewhere classified  Unsteadiness on feet  Other abnormalities of gait and mobility  Muscle weakness (generalized)     Problem List Patient Active Problem List   Diagnosis Date Noted  . Strain of right biceps 09/12/2017  . Lymphedema 02/10/2017  . BPH associated with nocturia 05/16/2016  . Senile purpura ([place]) 05/03/2016  . OSA (obstructive sleep apnea) 12/28/2015  . Hypersomnia 11/15/2015  . Cough 11/15/2015  . 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 Dizziness 12/30/2014  . Diastolic dysfunction 70/26/3785  . Former smoker 04/29/2014  . Chronic venous insufficiency 02/20/2014  . Hyperglycemia 08/02/2012  . Morbid obesity ([place]) 07/13/2010  . History of malignant melanoma. left leg 03/09/2009  . Insomnia 10/03/2007  . Hyperlipidemia 12/13/2006  . Essential hypertension 12/13/2006    [name] 06/27/2019, 11:24 AM  [place]  [place], [place], [address] Phone: [phone]   Fax:  [phone]  Name: ODAY RIDINGS MRN: [id number] Date of Birth: [birth year]

## 2019-06-30 ENCOUNTER — Other Ambulatory Visit: Payer: Self-pay

## 2019-06-30 ENCOUNTER — Ambulatory Visit: Payer: PPO | Admitting: Rehabilitation

## 2019-06-30 ENCOUNTER — Encounter: Payer: Self-pay | Admitting: Rehabilitation

## 2019-06-30 DIAGNOSIS — R2681 Unsteadiness on feet: Secondary | ICD-10-CM

## 2019-06-30 DIAGNOSIS — I890 Lymphedema, not elsewhere classified: Secondary | ICD-10-CM

## 2019-06-30 DIAGNOSIS — R2689 Other abnormalities of gait and mobility: Secondary | ICD-10-CM

## 2019-06-30 DIAGNOSIS — M6281 Muscle weakness (generalized): Secondary | ICD-10-CM

## 2019-06-30 NOTE — Therapy (Signed)
[place], [place], [address] Phone: [phone]   Fax:  [phone]  Physical Therapy Treatment  Patient Details  Name: Nicholas [name] MRN: [id number] Date of Birth: [birth year] Referring Provider (PT): Dr. [name]   Encounter Date: 06/30/2019  PT End of Session - 06/30/19 1712    Visit Number  14    Number of Visits  25    Date for PT Re-Evaluation  07/28/19    Authorization Type  [employer] (Medicare)-will need 10th visit progress note    PT Start Time  1502    PT Stop Time  1558    PT Time Calculation (min)  56 min    Activity Tolerance  Patient tolerated treatment well    Behavior During Therapy  [facility] for tasks assessed/performed       Past Medical History:  Diagnosis Date  . 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 Hyperlipidemia   . Hypertension   . Lymphedema     Past Surgical History:  Procedure Laterality Date  . 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 removal of melanoma of left foot/amputation of toes 1&25 Jul 2006  . WRIST FRACTURE SURGERY     73 years old-set    There were no vitals filed for this visit.  Subjective Assessment - 06/30/19 1705    Subjective  I am ok.  Lets order a velcro    Pertinent History  Lymphedema-has compression machine and uses stockings (wife helps); hx of PD/Parkinson's Plus Syndrone; malignant melanoma, hyperlipidemia, HTN, insomnia, obesity, hyperglycemia, dizziness, OSA, urinary urgency    Currently in Pain?  No/denies                  Outpatient Re[facility] from [facility]  Lymphedema Life Impact Scale Total Score  14.71 %           [facility] Adult PT Treatment/Exercise - 06/30/19 0001      Manual Therapy   Manual therapy comments  washed pts lower leg with mild soap and water and applied llotion    Edema Management  helped pt order  online a farrow 4000 size large and farrow classic foot wrap as sized and recommended by Melissa from [employer] ordered to the patients house    Manual Lymphatic Drainage (MLD)  short session today: Rt inguinal nodes, work on the Rt LE from proximal to distal focusing on the lower leg and med/lat ankles where fluid tends to build up.  Increased redness and scaliness on the 2 lateral lower leg circular spots that the patient consistently has and then working again proximally    Compression Bandaging  Rt LE; thick lotion to the Rt leg, TG soft stockinette, elastomull folded for digits 1-4, Thin foam on dorsum of foot , 2 artiflex, 1-8 cm bandage, 1 10 cm and 1 12 cm short stretch bandages from foot to below the knee.  did not use 2nd bandage today or foam due to some increased skin irritation on the lateral lower leg from consistent bandaging                PT Short Term Goals - 07/02/18 2049      Additional Short Term Goals   Additional Short Term Goals  Yes      PT SHORT TERM GOAL #6   Title  (fir lymphedema) Pt will  have system in place for manage of wound drainage and promotion of wound healing     Time  4    Period  Weeks    Status  New        PT Long Term Goals - 06/16/19 1501      PT LONG TERM GOAL #1   Title  Pt will decrease lymphedema volume in bilateral LEs to allow pt and wife to donn stockings at home for continued self care    Baseline  Pt is able to don his reduction kit on his RLE with help of his spouse.    Time  6    Period  Weeks    Status  On-going    Target Date  07/28/19            Plan - 06/30/19 1713    Clinical Impression Statement  Helped pt order farrow wrap and foot piece for one LE for now and he will get a second set for the other leg as possible.  Pt continues with softer skin and less fibrosis but edema accumulation lateral and medial ankles due to dependent position most of the day.  pt had some increased skin irritation upper lateral leg  where he usually has 2 dry red circles so we decreased bandages by one.    PT Frequency  3x / week    PT Duration  6 weeks    PT Treatment/Interventions  ADLs/Self Care Home Management;Therapeutic activities;Therapeutic exercise;Patient/family education;Orthotic Fit/Training;Manual techniques;Vasopneumatic Device;Compression bandaging;Manual lymph drainage;Functional mobility training;DME Instruction    PT Next Visit Plan  continue CDT of bil LEs until ind with velcro garments and pump    Consulted and Agree with Plan of Care  Patient       Patient will benefit from skilled therapeutic intervention in order to improve the following deficits and impairments:     Visit Diagnosis: Lymphedema, not elsewhere classified  Unsteadiness on feet  Other abnormalities of gait and mobility  Muscle weakness (generalized)     Problem List Patient Active Problem List   Diagnosis Date Noted  . Strain of right biceps 09/12/2017  . Lymphedema 02/10/2017  . BPH associated with nocturia 05/16/2016  . Senile purpura ([place]) 05/03/2016  . OSA (obstructive sleep apnea) 12/28/2015  . Hypersomnia 11/15/2015  . Cough 11/15/2015  . 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 Dizziness 12/30/2014  . Diastolic dysfunction 68/02/8109  . Former smoker 04/29/2014  . Chronic venous insufficiency 02/20/2014  . Hyperglycemia 08/02/2012  . Morbid obesity ([place]) 07/13/2010  . History of malignant melanoma. left leg 03/09/2009  . Insomnia 10/03/2007  . Hyperlipidemia 12/13/2006  . Essential hypertension 12/13/2006    [name] 06/30/2019, 5:15 PM  [place], [place], [address] Phone: [phone]   Fax:  [phone]  Name: ELVIN MCCARTIN MRN: [id number] Date of Birth: 10/25/1945

## 2019-07-02 ENCOUNTER — Other Ambulatory Visit: Payer: Self-pay

## 2019-07-02 ENCOUNTER — Ambulatory Visit: Payer: PPO

## 2019-07-02 DIAGNOSIS — M6281 Muscle weakness (generalized): Secondary | ICD-10-CM

## 2019-07-02 DIAGNOSIS — I890 Lymphedema, not elsewhere classified: Secondary | ICD-10-CM

## 2019-07-02 DIAGNOSIS — R2681 Unsteadiness on feet: Secondary | ICD-10-CM

## 2019-07-02 DIAGNOSIS — R2689 Other abnormalities of gait and mobility: Secondary | ICD-10-CM

## 2019-07-02 NOTE — Therapy (Signed)
[place], [place], [address] Phone: [phone]   Fax:  [phone]  Physical Therapy Treatment  Patient Details  Name: Nicholas [name] MRN: [id number] Date of Birth: 1945/09/30 Referring Provider (PT): Dr. [name]   Encounter Date: 07/02/2019  PT End of Session - 07/02/19 1506    Visit Number  15    Number of Visits  25    Date for PT Re-Evaluation  07/28/19    Authorization Type  [employer] (Medicare)-will need 10th visit progress note    PT Start Time  1505    PT Stop Time  1600    PT Time Calculation (min)  55 min    Activity Tolerance  Patient tolerated treatment well    Behavior During Therapy  [facility] for tasks assessed/performed       Past Medical History:  Diagnosis Date  . 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 Hyperlipidemia   . Hypertension   . Lymphedema     Past Surgical History:  Procedure Laterality Date  . 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 removal of melanoma of left foot/amputation of toes 1&25 Jul 2006  . WRIST FRACTURE SURGERY     73 years old-set    There were no vitals filed for this visit.  Subjective Assessment - 07/02/19 1506    Subjective  Pt reports that he is ordering a velcro wrap and that he has no pain today.    Pertinent History  Lymphedema-has compression machine and uses stockings (wife helps); hx of PD/Parkinson's Plus Syndrone; malignant melanoma, hyperlipidemia, HTN, insomnia, obesity, hyperglycemia, dizziness, OSA, urinary urgency    Patient Stated Goals  get back in stockings and to put his his shoes on    Currently in Pain?  No/denies                  Outpatient [facility]  Lymphedema Life Impact Scale Total Score  14.71 %           [facility] Adult PT Treatment/Exercise - 07/02/19 0001      Manual Therapy   Manual  Therapy  Edema management;Manual Lymphatic Drainage (MLD);Compression Bandaging    Manual Lymphatic Drainage (MLD)  In supine: short neck, Bil axillary and inguinal lymph nodes, established BIl inguino-axillary anastomosis, medial to lateral L thigh then lateral L thigh to the knee and bottle neck; then RLE medial to lateral thigh, lateral thigh, bottle neck at knee, posterior knee, all surfaces of the lowe rleg with extra time and dorsum of the R foot; reworked all surfaces then deep abdominals.     Compression Bandaging  In Supine: R lower leg was washed with soap/water, rinsed and dried Thick lotion was applied to the R lower leg. TG soft stockinette, 2 inch elastomull folded for all 5 digits. ABD pad at the anterior ankle for transition to decrease bunching of bandages, 1/2 inch gray foam at dorsum of the foot and Bil malleoli and folded ABD pad at the R lateral malleoli due to increased edema in this area. Pt was warpped using 1 8 cm bandage for roman sandal, 10 cm for ankle sole heel and 2 12 cm bandages to below the knee.              PT Education - 07/02/19 1614    Education Details  Pt will  continue wearing his velcro wrap on the L leg. He will remove his wraps after 2 days and try to wear other velcro wrap on R leg due to he is not coming again for another 5 days to get his RLE wrapped.    Person(s) Educated  Patient    Methods  Explanation    Comprehension  Verbalized understanding          PT Long Term Goals - 06/16/19 1501      PT LONG TERM GOAL #1   Title  Pt will decrease lymphedema volume in bilateral LEs to allow pt and wife to donn stockings at home for continued self care    Baseline  Pt is able to don his reduction kit on his RLE with help of his spouse.    Time  6    Period  Weeks    Status  On-going    Target Date  07/28/19            Plan - 07/02/19 1505    Clinical Impression Statement  Pt continues with much softer R lower leg demonstrating decreased  fibrosis with improvement in the skin (less rubor this session). Increased fluid build up at the R lateral malleoli; added extra abd pad to this area in order to help facilitate fluid flow out. Pt was wrapped with 4 short stretch bandages today with out gray foam to continue maintaining good skin integrity and let skin rest. Pt will benefit from continued POC at this time.    Personal Factors and Comorbidities  Age;Fitness;Comorbidity 2    Comorbidities  limited mobility, lymph node removal    Rehab Potential  Fair    Clinical Impairments Affecting Rehab Potential  history of parkinsons disease with limitations in ablilty to put on compression garments    PT Frequency  3x / week    PT Duration  6 weeks    PT Treatment/Interventions  ADLs/Self Care Home Management;Therapeutic activities;Therapeutic exercise;Patient/family education;Orthotic Fit/Training;Manual techniques;Vasopneumatic Device;Compression bandaging;Manual lymph drainage;Functional mobility training;DME Instruction    PT Next Visit Plan  continue CDT of bil LEs until ind with velcro garments and pump    Consulted and Agree with Plan of Care  Patient       Patient will benefit from skilled therapeutic intervention in order to improve the following deficits and impairments:  Decreased skin integrity, Postural dysfunction, Obesity, Increased edema, Difficulty walking, Decreased mobility  Visit Diagnosis: Lymphedema, not elsewhere classified  Unsteadiness on feet  Other abnormalities of gait and mobility  Muscle weakness (generalized)     Problem List Patient Active Problem List   Diagnosis Date Noted  . Strain of right biceps 09/12/2017  . Lymphedema 02/10/2017  . BPH associated with nocturia 05/16/2016  . Senile [name] ([place]) 05/03/2016  . OSA (obstructive sleep apnea) 12/28/2015  . Hypersomnia 11/15/2015  . Cough 11/15/2015  . 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 Dizziness 12/30/2014  . Diastolic  dysfunction 48/54/6270  . Former smoker 04/29/2014  . Chronic venous insufficiency 02/20/2014  . Hyperglycemia 08/02/2012  . Morbid obesity ([place]) 07/13/2010  . History of malignant melanoma. left leg 03/09/2009  . Insomnia 10/03/2007  . Hyperlipidemia 12/13/2006  . Essential hypertension 12/13/2006    Nicholas [name], PT 07/02/2019, 4:51 PM  [place], [place], [address] Phone: [phone]   Fax:  [phone]  Name: Nicholas [name] MRN: [id number] Date of Birth: April 18, 1946

## 2019-07-07 ENCOUNTER — Ambulatory Visit: Payer: PPO

## 2019-07-07 ENCOUNTER — Other Ambulatory Visit: Payer: Self-pay

## 2019-07-07 DIAGNOSIS — I890 Lymphedema, not elsewhere classified: Secondary | ICD-10-CM | POA: Diagnosis not present

## 2019-07-07 DIAGNOSIS — M6281 Muscle weakness (generalized): Secondary | ICD-10-CM

## 2019-07-07 DIAGNOSIS — R2681 Unsteadiness on feet: Secondary | ICD-10-CM

## 2019-07-07 DIAGNOSIS — R2689 Other abnormalities of gait and mobility: Secondary | ICD-10-CM

## 2019-07-07 NOTE — Therapy (Signed)
[place], [place], [address] Phone: [phone]   Fax:  [phone]  Physical Therapy Treatment  Patient Details  Name: Nicholas [name] MRN: [id number] Date of Birth: [birth year] Referring Provider (PT): Dr. [name]   Encounter Date: 07/07/2019  PT End of Session - 07/07/19 1510    Visit Number  16    Number of Visits  25    Date for PT Re-Evaluation  07/28/19    Authorization Type  [employer] (Medicare)-will need 10th visit progress note    PT Start Time  1455    PT Stop Time  1555    PT Time Calculation (min)  60 min    Activity Tolerance  Patient tolerated treatment well    Behavior During Therapy  [facility] for tasks assessed/performed       Past Medical History:  Diagnosis Date  . 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 Hyperlipidemia   . Hypertension   . Lymphedema     Past Surgical History:  Procedure Laterality Date  . 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 removal of melanoma of left foot/amputation of toes 1&25 Jul 2006  . WRIST FRACTURE SURGERY     73 years old-set    There were no vitals filed for this visit.  Subjective Assessment - 07/07/19 1510    Subjective  Pt states that her brought his Farro Wrap today. She pumped today and over the weekend after removing his wrap on Friday.    Pertinent History  Lymphedema-has compression machine and uses stockings (wife helps); hx of PD/Parkinson's Plus Syndrone; malignant melanoma, hyperlipidemia, HTN, insomnia, obesity, hyperglycemia, dizziness, OSA, urinary urgency    Patient Stated Goals  get back in stockings and to put his his shoes on    Currently in Pain?  No/denies    Multiple Pain Sites  No                  Outpatient [facility]  Lymphedema Life Impact Scale Total Score  14.71 %           [facility]  Adult PT Treatment/Exercise - 07/07/19 0001      Manual Therapy   Manual Therapy  Edema management;Manual Lymphatic Drainage (MLD);Compression Bandaging    Edema Management  Pt was assisted with donning FarroWrap on the LLE and spouse was shown how to don. Discussed wearing Francia Greaves during the day and Tribute at night.     Manual Lymphatic Drainage (MLD)  In supine: short neck, Bil axillary and inguinal lymph nodes, established BIl inguino-axillary anastomosis, medial to lateral L thigh then lateral L thigh to the knee and bottle neck; then RLE medial to lateral thigh, lateral thigh, bottle neck at knee, posterior knee, all surfaces of the lowe rleg with extra time and dorsum of the R foot; reworked all surfaces then deep abdominals.     Compression Bandaging  In Supine. RLE Thick lotion was applied to the R lower leg. TG soft stockinette, 2 inch elastomull folded for all 5 digits. ABD pad at the anterior ankle for transition to decrease bunching of bandages, 1/2 inch gray foam at dorsum of the foot and Bil malleoli and wavy komprex pad at the R lateral malleoli due to increased edema in this area. Pt was warpped using 1 8 cm bandage for roman sandal, 10 cm  for ankle sole heel and 2 12 cm bandages to below the knee.                   PT Long Term Goals - 06/16/19 1501      PT LONG TERM GOAL #1   Title  Pt will decrease lymphedema volume in bilateral LEs to allow pt and wife to donn stockings at home for continued self care    Baseline  Pt is able to don his reduction kit on his RLE with help of his spouse.    Time  6    Period  Weeks    Status  On-going    Target Date  07/28/19            Plan - 07/07/19 1453    Clinical Impression Statement  Pt presents to physical therapy with a Farrow wrap for the L lower leg. It was applied today and pt spouse was shown how to apply with step by step instructions. pt continues with fluid stagnating at the R lateal malleoli; wavy komprex  pad added to the R lateral malleoli this sesion to decrease edema in this area. MLD was performed on the anterior trunk and on the RLE down to the dorsum of the foot and the LLE down to the knee this session. Pt was wrapped with compression wrap on the R lowe rleg with 4 short stretch bandages and no gray foam over the anterior lower leg to keep skin from getting irritated. Pt will benefit from continued POC at this time until he is able to don velcro wraps on Bil LE with assistance of spouse.    Personal Factors and Comorbidities  Age;Fitness;Comorbidity 2    Comorbidities  limited mobility, lymph node removal    Rehab Potential  Fair    Clinical Impairments Affecting Rehab Potential  history of parkinsons disease with limitations in ablilty to put on compression garments, see if pt likes Farrow wrap on LLE and if he wants to order for the R.    PT Frequency  3x / week    PT Duration  6 weeks    PT Treatment/Interventions  ADLs/Self Care Home Management;Therapeutic activities;Therapeutic exercise;Patient/family education;Orthotic Fit/Training;Manual techniques;Vasopneumatic Device;Compression bandaging;Manual lymph drainage;Functional mobility training;DME Instruction    PT Next Visit Plan  continue CDT of bil LEs until ind with velcro garments and pump    Consulted and Agree with Plan of Care  Patient    Family Member Consulted  wife       Patient will benefit from skilled therapeutic intervention in order to improve the following deficits and impairments:  Decreased skin integrity, Postural dysfunction, Obesity, Increased edema, Difficulty walking, Decreased mobility  Visit Diagnosis: Lymphedema, not elsewhere classified  Unsteadiness on feet  Other abnormalities of gait and mobility  Muscle weakness (generalized)     Problem List Patient Active Problem List   Diagnosis Date Noted  . Strain of right biceps 09/12/2017  . Lymphedema 02/10/2017  . BPH associated with nocturia  05/16/2016  . Senile purpura ([place]) 05/03/2016  . OSA (obstructive sleep apnea) 12/28/2015  . Hypersomnia 11/15/2015  . Cough 11/15/2015  . 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 Dizziness 12/30/2014  . Diastolic dysfunction 72/03/4708  . Former smoker 04/29/2014  . Chronic venous insufficiency 02/20/2014  . Hyperglycemia 08/02/2012  . Morbid obesity ([place]) 07/13/2010  . History of malignant melanoma. left leg 03/09/2009  . Insomnia 10/03/2007  . Hyperlipidemia 12/13/2006  . Essential hypertension 12/13/2006  Ander Purpura , PT 07/07/2019, 4:05 PM  [place], [place], [address] Phone: [phone]   Fax:  [phone]  Name: Nicholas [name] MRN: [id number] Date of Birth: 04/04/1946

## 2019-07-09 ENCOUNTER — Encounter: Payer: Self-pay | Admitting: Rehabilitation

## 2019-07-09 ENCOUNTER — Other Ambulatory Visit: Payer: Self-pay

## 2019-07-09 ENCOUNTER — Ambulatory Visit: Payer: PPO | Admitting: Rehabilitation

## 2019-07-09 DIAGNOSIS — M6281 Muscle weakness (generalized): Secondary | ICD-10-CM

## 2019-07-09 DIAGNOSIS — I890 Lymphedema, not elsewhere classified: Secondary | ICD-10-CM | POA: Diagnosis not present

## 2019-07-09 DIAGNOSIS — R2689 Other abnormalities of gait and mobility: Secondary | ICD-10-CM

## 2019-07-09 DIAGNOSIS — R2681 Unsteadiness on feet: Secondary | ICD-10-CM

## 2019-07-09 NOTE — Therapy (Signed)
[place], [place], [address] Phone: [phone]   Fax:  [phone]  Physical Therapy Treatment  Patient Details  Name: Nicholas [name] MRN: [id number] Date of Birth: [birth year] Referring Provider (PT): Dr. [name]   Encounter Date: 07/09/2019  PT End of Session - 07/09/19 1703    Visit Number  17    Number of Visits  25    Date for PT Re-Evaluation  07/28/19    Authorization Type  [employer] (Medicare)-will need 10th visit progress note    PT Start Time  1506    PT Stop Time  1555    PT Time Calculation (min)  49 min    Activity Tolerance  Patient tolerated treatment well    Behavior During Therapy  Nicholas [name] for tasks assessed/performed       Past Medical History:  Diagnosis Date  . 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 Hyperlipidemia   . Hypertension   . Lymphedema     Past Surgical History:  Procedure Laterality Date  . 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 removal of melanoma of left foot/amputation of toes 1&25 Jul 2006  . WRIST FRACTURE SURGERY     73 years old-set    There were no vitals filed for this visit.  Subjective Assessment - 07/09/19 1513    Subjective  Likes the farrow wrap and wants to order this for the other side.    Pertinent History  Lymphedema-has compression machine and uses stockings (wife helps); hx of PD/Parkinson's Plus Syndrone; malignant melanoma, hyperlipidemia, HTN, insomnia, obesity, hyperglycemia, dizziness, OSA, urinary urgency    Patient Stated Goals  get back in stockings and to put his his shoes on    Currently in Pain?  No/denies                  Outpatient Re[facility] from [facility]02/2018 [facility]  Lymphedema Life Impact Scale Total Score  14.71 %           [facility] Adult PT Treatment/Exercise - 07/09/19 0001      Manual Therapy   Edema Management   reviewed farrowwrap application with pt and wife which looks well done just a bit loose on the foot straps.  Helped pt order a second set for his Rt LE.  Unwrapped and washed pts LE.  The top part of the bandage was wet so he was given 2 new 12cm bandages and new artiflex/tg soft.      Compression Bandaging  In Supine. RLE Thick lotion was applied to the R lower leg. TG soft stockinette, 2 inch elastomull folded for digits 1-4. ABD pad at the anterior ankle for transition to decrease bunching of bandages, 1/2 inch gray foam at dorsum of the foot and Bil malleoli and wavy komprex pad at the R lateral malleoli due to increased edema in this area. Pt was warpped using 1 8 cm bandage for roman sandal, 10 cm for ankle sole heel and 1 12 cm bandages to below the knee.                PT Short Term Goals - 07/02/18 2049      Additional Short Term Goals   Additional Short Term Goals  Yes      PT SHORT TERM GOAL #6   Title  (fir lymphedema) Pt will have system  in place for manage of wound drainage and promotion of wound healing     Time  4    Period  Weeks    Status  New        PT Long Term Goals - 06/16/19 1501      PT LONG TERM GOAL #1   Title  Pt will decrease lymphedema volume in bilateral LEs to allow pt and wife to donn stockings at home for continued self care    Baseline  Pt is able to don his reduction kit on his RLE with help of his spouse.    Time  6    Period  Weeks    Status  On-going    Target Date  07/28/19            Plan - 07/09/19 1704    Clinical Impression Statement  Pt has liked new farrow wrap and will get one for the other leg.  Helped pt order this for the other side through [email or web address] to ship to his home.  The Rt LE is starting to look better again with looser skin all over and less fluid accumulation at the malleoli.  Pt has a pedicure tomorrow so he will remove bandages and apply tribute.    PT Frequency  3x / week    PT Duration  6 weeks     PT Treatment/Interventions  ADLs/Self Care Home Management;Therapeutic activities;Therapeutic exercise;Patient/family education;Orthotic Fit/Training;Manual techniques;Vasopneumatic Device;Compression bandaging;Manual lymph drainage;Functional mobility training;DME Instruction    PT Next Visit Plan  continue CDT of bil LEs until ind with velcro garments and pump    Consulted and Agree with Plan of Care  Patient       Patient will benefit from skilled therapeutic intervention in order to improve the following deficits and impairments:     Visit Diagnosis: Lymphedema, not elsewhere classified  Unsteadiness on feet  Other abnormalities of gait and mobility  Muscle weakness (generalized)     Problem List Patient Active Problem List   Diagnosis Date Noted  . Strain of right biceps 09/12/2017  . Lymphedema 02/10/2017  . BPH associated with nocturia 05/16/2016  . Senile purpura ([place]) 05/03/2016  . OSA (obstructive sleep apnea) 12/28/2015  . Hypersomnia 11/15/2015  . Cough 11/15/2015  . 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 Dizziness 12/30/2014  . Diastolic dysfunction 79/15/0413  . Former smoker 04/29/2014  . Chronic venous insufficiency 02/20/2014  . Hyperglycemia 08/02/2012  . Morbid obesity ([place]) 07/13/2010  . History of malignant melanoma. left leg 03/09/2009  . Insomnia 10/03/2007  . Hyperlipidemia 12/13/2006  . Essential hypertension 12/13/2006    [name] 07/09/2019, 5:06 PM  [place], [place], [address] Phone: [phone]   Fax:  [phone]  Name: Nicholas [name] MRN: [id number] Date of Birth: Oct 02, 1945

## 2019-07-11 ENCOUNTER — Ambulatory Visit: Payer: PPO

## 2019-07-11 ENCOUNTER — Other Ambulatory Visit: Payer: Self-pay

## 2019-07-11 DIAGNOSIS — I890 Lymphedema, not elsewhere classified: Secondary | ICD-10-CM

## 2019-07-11 DIAGNOSIS — R2689 Other abnormalities of gait and mobility: Secondary | ICD-10-CM

## 2019-07-11 DIAGNOSIS — R2681 Unsteadiness on feet: Secondary | ICD-10-CM

## 2019-07-11 DIAGNOSIS — M6281 Muscle weakness (generalized): Secondary | ICD-10-CM

## 2019-07-11 NOTE — Therapy (Signed)
[place], [place], [address] Phone: [phone]   Fax:  [phone]  Physical Therapy Treatment  Patient Details  Name: Nicholas [name] MRN: [id number] Date of Birth: 1946/07/05 Referring Provider (PT): Dr. [name]   Encounter Date: 07/11/2019  PT End of Session - 07/11/19 1219    Visit Number  18    Number of Visits  25    Date for PT Re-Evaluation  07/28/19    Authorization Type  [employer] (Medicare)-will need 10th visit progress note    PT Start Time  1105    PT Stop Time  1200    PT Time Calculation (min)  55 min    Activity Tolerance  Patient tolerated treatment well    Behavior During Therapy  [facility] for tasks assessed/performed       Past Medical History:  Diagnosis Date  . 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 Hyperlipidemia   . Hypertension   . Lymphedema     Past Surgical History:  Procedure Laterality Date  . 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 removal of melanoma of left foot/amputation of toes 1&25 Jul 2006  . WRIST FRACTURE SURGERY     73 years old-set    There were no vitals filed for this visit.  Subjective Assessment - 07/11/19 1220    Subjective  Pt states that he took all his wraps off yesterday to go see the podiatrist.    Pertinent History  Lymphedema-has compression machine and uses stockings (wife helps); hx of PD/Parkinson's Plus Syndrone; malignant melanoma, hyperlipidemia, HTN, insomnia, obesity, hyperglycemia, dizziness, OSA, urinary urgency    Patient Stated Goals  get back in stockings and to put his his shoes on    Currently in Pain?  No/denies                  Out[facility]  Lymphedema Life Impact Scale Total Score  14.71 %           [facility] Adult PT Treatment/Exercise - 07/11/19 0001      Manual Therapy   Manual  Therapy  Manual Lymphatic Drainage (MLD);Compression Bandaging    Edema Management  Farrow wrap was placed on the LLE    Manual Lymphatic Drainage (MLD)  In supine: short neck, Bil axillary and inguinal lymph nodes, established BIl inguino-axillary anastomosis, medial to lateral L thigh then lateral L thigh to the knee, all surfaces of the lower leg and dorsum of the foot; then RLE medial to lateral thigh, lateral thigh, bottle neck at knee, posterior knee, all surfaces of the lowe rleg with extra time and dorsum of the R foot; reworked all surfaces then deep abdominals.     Compression Bandaging  In Supine. RLE Thick lotion was applied to the R lower leg. TG soft stockinette, 3 inch elastomull folded for digits 1-3, 1/2 inch gray foam at dorsum of the foot and Bil malleoli, Pt was wrapped using 1 8 cm bandage for roman sandal, 10 cm for ankle sole heel and 1 12 cm bandages to below the knee.              PT Education - 07/11/19 1228    Education Details  Pt will continue to wear his velcro wrap on the LLE and keep his wrap on the RLE for 2 days. He  will don his velcro wrap if he receives it.    Person(s) Educated  Patient    Methods  Explanation    Comprehension  Verbalized understanding          PT Long Term Goals - 06/16/19 1501      PT LONG TERM GOAL #1   Title  Pt will decrease lymphedema volume in bilateral LEs to allow pt and wife to donn stockings at home for continued self care    Baseline  Pt is able to don his reduction kit on his RLE with help of his spouse.    Time  6    Period  Weeks    Status  On-going    Target Date  07/28/19            Plan - 07/11/19 1207    Clinical Impression Statement  Pt has not received his farrow wrap for the RLE as of today. He presented with no wraps on either leg becuase he had gone to the podiatrist. MLD was performed on the anterior trunk and BLE from going to foot Bil. Farrow wrap was placed on the LLE and pt was wrapped with  layered compression wrap on the LLE using 1/2 inch gray foam over the dorsum of the foot, Bil malleoli. Pt will benefit from continued POC at this time until he recieves his second Farrow wrap. Then possibly physical therapy for deconditioning.    Personal Factors and Comorbidities  Age;Fitness;Comorbidity 2    Comorbidities  limited mobility, lymph node removal    Clinical Decision Making  Moderate    Rehab Potential  Fair    Clinical Impairments Affecting Rehab Potential  history of parkinsons disease with limitations in ablilty to put on compression garments, see if pt likes Farrow wrap on LLE and if he wants to order for the R.    PT Frequency  3x / week    PT Duration  6 weeks    PT Treatment/Interventions  ADLs/Self Care Home Management;Therapeutic activities;Therapeutic exercise;Patient/family education;Orthotic Fit/Training;Manual techniques;Vasopneumatic Device;Compression bandaging;Manual lymph drainage;Functional mobility training;DME Instruction    PT Next Visit Plan  continue CDT of bil LEs until ind with velcro garments and pump    Consulted and Agree with Plan of Care  Patient    Family Member Consulted  wife       Patient will benefit from skilled therapeutic intervention in order to improve the following deficits and impairments:  Decreased skin integrity, Postural dysfunction, Obesity, Increased edema, Difficulty walking, Decreased mobility  Visit Diagnosis: Lymphedema, not elsewhere classified  Unsteadiness on feet  Other abnormalities of gait and mobility  Muscle weakness (generalized)     Problem List Patient Active Problem List   Diagnosis Date Noted  . Strain of right biceps 09/12/2017  . Lymphedema 02/10/2017  . BPH associated with nocturia 05/16/2016  . Senile purpura ([place]) 05/03/2016  . OSA (obstructive sleep apnea) 12/28/2015  . Hypersomnia 11/15/2015  . Cough 11/15/2015  . 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 Dizziness 12/30/2014  .  Diastolic dysfunction 62/37/6283  . Former smoker 04/29/2014  . Chronic venous insufficiency 02/20/2014  . Hyperglycemia 08/02/2012  . Morbid obesity ([place]) 07/13/2010  . History of malignant melanoma. left leg 03/09/2009  . Insomnia 10/03/2007  . Hyperlipidemia 12/13/2006  . Essential hypertension 12/13/2006    Ander Purpura , PT 07/11/2019, 12:29 PM  [place], [place], [address] Phone: [phone]   Fax:  [phone]  Name: SUNIL HUE MRN: [id number] Date of Birth: 1946/05/10

## 2019-07-14 ENCOUNTER — Ambulatory Visit: Payer: PPO

## 2019-07-14 ENCOUNTER — Other Ambulatory Visit: Payer: Self-pay

## 2019-07-14 DIAGNOSIS — R2681 Unsteadiness on feet: Secondary | ICD-10-CM

## 2019-07-14 DIAGNOSIS — I890 Lymphedema, not elsewhere classified: Secondary | ICD-10-CM

## 2019-07-14 DIAGNOSIS — M6281 Muscle weakness (generalized): Secondary | ICD-10-CM

## 2019-07-14 DIAGNOSIS — R2689 Other abnormalities of gait and mobility: Secondary | ICD-10-CM

## 2019-07-14 NOTE — Therapy (Signed)
[place], [place], [address] Phone: [phone]   Fax:  [phone]  Physical Therapy Treatment  Patient Details  Name: Nicholas [name] MRN: [id number] Date of Birth: Oct 14, 1945 Referring Provider (PT): Dr. [name]   Encounter Date: 07/14/2019  PT End of Session - 07/14/19 1609    Visit Number  19    Number of Visits  25    Date for PT Re-Evaluation  07/28/19    Authorization Type  [employer] (Medicare)-will need 10th visit progress note    PT Start Time  1517    PT Stop Time  1600    PT Time Calculation (min)  43 min    Activity Tolerance  Patient tolerated treatment well    Behavior During Therapy  [facility] for tasks assessed/performed       Past Medical History:  Diagnosis Date  . 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 Hyperlipidemia   . Hypertension   . Lymphedema     Past Surgical History:  Procedure Laterality Date  . 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 removal of melanoma of left foot/amputation of toes 1&25 Jul 2006  . WRIST FRACTURE SURGERY     73 years old-set    There were no vitals filed for this visit.  Subjective Assessment - 07/14/19 1604    Subjective  Pt reports that he took his R wrap off his LE today and that he thinks they may have been putting his Farrow wrap on upside down on the LLE which is why it is more swollen today.    Pertinent History  Lymphedema-has compression machine and uses stockings (wife helps); hx of PD/Parkinson's Plus Syndrone; malignant melanoma, hyperlipidemia, HTN, insomnia, obesity, hyperglycemia, dizziness, OSA, urinary urgency    Patient Stated Goals  get back in stockings and to put his his shoes on    Currently in Pain?  No/denies    Multiple Pain Sites  No                  Outpatient [facility]  Lymphedema Life  Impact Scale Total Score  14.71 %           [facility] Adult PT Treatment/Exercise - 07/14/19 0001      Manual Therapy   Manual Therapy  Manual Lymphatic Drainage (MLD);Compression Bandaging    Edema Management  Farrow wrap was placed on the RLE and both farrow wraps were labeled for the bottom to promote correct donning.     Manual Lymphatic Drainage (MLD)  In supine: short neck, Bil axillary and inguinal lymph nodes, established BIl inguino-axillary anastomosis, medial to lateral L thigh then lateral L thigh to the knee, all surfaces of the lower leg and dorsum of the foot; then RLE medial to lateral thigh, lateral thigh, bottle neck at knee, posterior knee; reworked all surfaces then 5 diaphragmatic breathes.     Compression Bandaging  In Supine. LLE Thick lotion was applied to the L lower leg. TG soft stockinette, 3 inch elastomull folded for digits 1,4-5 1/2 inch gray foam at dorsum at Bil malleoli, Pt was wrapped using 1 8 cm bandage for roman sandal, 10 cm for ankle sole heel and 2 12 cm bandages to below the knee.              PT Education - 07/14/19 1608  Education Details  Pt will wear Farrow wrap on the RLE and keep the compression wrap on his LLE until his next session in 2 days or remove his wrap if he is unable to make his appointment.    Person(s) Educated  Patient    Methods  Explanation    Comprehension  Verbalized understanding       PT Short Term Goals - 07/02/18 2049      Additional Short Term Goals   Additional Short Term Goals  Yes      PT SHORT TERM GOAL #6   Title  (fir lymphedema) Pt will have system in place for manage of wound drainage and promotion of wound healing     Time  4    Period  Weeks    Status  New        PT Long Term Goals - 06/16/19 1501      PT LONG TERM GOAL #1   Title  Pt will decrease lymphedema volume in bilateral LEs to allow pt and wife to donn stockings at home for continued self care    Baseline  Pt is able to don his  reduction kit on his RLE with help of his spouse.    Time  6    Period  Weeks    Status  On-going    Target Date  07/28/19            Plan - 07/14/19 1609    Clinical Impression Statement  Pt has received his farrow wrap for the RLE today. He presents wtih no wraps on either leg and stated that he removed his compression wrap this morning. He has significant visual increase in edema in the LLE this session due to pt states his velcro wrap may have been donned upside down. MLD was performed on the anterior trunk and BLE from groin to knee on the R and groin to Foot on the L with extra time spent on the Bil malleoli. Farrow wrap was placed on the RLE with good fit using compression sock and wrap for more even compression. Pt was wrapped with layered compression wrap on the LLE using 12 inch gray foam over Bil malleoli. Pt will benefit from continued POC at this time until he is able to don Farrow wraps at home. Discussed going to neuro therapy for parkinson's following discharge from lymphedema physical therapy.    Personal Factors and Comorbidities  Age;Fitness;Comorbidity 2    Comorbidities  limited mobility, lymph node removal    Rehab Potential  Fair    Clinical Impairments Affecting Rehab Potential  history of parkinsons disease with limitations in ablilty to put on compression garments, see if pt likes Farrow wrap on LLE and if he wants to order for the R.    PT Frequency  3x / week    PT Duration  6 weeks    PT Treatment/Interventions  ADLs/Self Care Home Management;Therapeutic activities;Therapeutic exercise;Patient/family education;Orthotic Fit/Training;Manual techniques;Vasopneumatic Device;Compression bandaging;Manual lymph drainage;Functional mobility training;DME Instruction    PT Next Visit Plan  continue CDT of bil LEs until ind with velcro garments and pump    Consulted and Agree with Plan of Care  Patient       Patient will benefit from skilled therapeutic intervention in  order to improve the following deficits and impairments:  Decreased skin integrity, Postural dysfunction, Obesity, Increased edema, Difficulty walking, Decreased mobility  Visit Diagnosis: Lymphedema, not elsewhere classified  Other abnormalities of gait and mobility  Unsteadiness  on feet  Muscle weakness (generalized)     Problem List Patient Active Problem List   Diagnosis Date Noted  . Strain of right biceps 09/12/2017  . Lymphedema 02/10/2017  . BPH associated with nocturia 05/16/2016  . Senile purpura ([place]) 05/03/2016  . OSA (obstructive sleep apnea) 12/28/2015  . Hypersomnia 11/15/2015  . Cough 11/15/2015  . 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 Dizziness 12/30/2014  . Diastolic dysfunction 34/12/8401  . Former smoker 04/29/2014  . Chronic venous insufficiency 02/20/2014  . Hyperglycemia 08/02/2012  . Morbid obesity ([place]) 07/13/2010  . History of malignant melanoma. left leg 03/09/2009  . Insomnia 10/03/2007  . Hyperlipidemia 12/13/2006  . Essential hypertension 12/13/2006    Ander Purpura, PT 07/14/2019, 4:13 PM  [place], [place], [address] Phone: [phone]   Fax:  [phone]  Name: LEMONT SITZMANN MRN: [id number] Date of Birth: 1946-01-02

## 2019-07-16 ENCOUNTER — Other Ambulatory Visit: Payer: Self-pay

## 2019-07-16 ENCOUNTER — Ambulatory Visit: Payer: PPO

## 2019-07-16 DIAGNOSIS — M6281 Muscle weakness (generalized): Secondary | ICD-10-CM

## 2019-07-16 DIAGNOSIS — R2689 Other abnormalities of gait and mobility: Secondary | ICD-10-CM

## 2019-07-16 DIAGNOSIS — R2681 Unsteadiness on feet: Secondary | ICD-10-CM

## 2019-07-16 DIAGNOSIS — I890 Lymphedema, not elsewhere classified: Secondary | ICD-10-CM | POA: Diagnosis not present

## 2019-07-16 NOTE — Addendum Note (Signed)
Addended by: Ander Purpura on: 07/16/2019 04:15 PM   Modules accepted: Orders

## 2019-07-16 NOTE — Therapy (Addendum)
[place], [place], [address] Phone: [phone]   Fax:  [phone]  Physical Therapy Progress Note  Progress Note Reporting Period 06/16/19 to 07/16/19  See note below for Objective Data and Assessment of Progress/Goals.       Patient Details  Name: Nicholas [name] MRN: [id number] Date of Birth: 1945-07-25 Referring Provider (PT): Dr. [name]   Encounter Date: 07/16/2019  PT End of Session - 07/16/19 1604    Visit Number  20    Number of Visits  25    Date for PT Re-Evaluation  07/28/19    Authorization Type  [employer] (Medicare)-will need 10th visit progress note    PT Start Time  1505    PT Stop Time  1550    PT Time Calculation (min)  45 min    Activity Tolerance  Patient tolerated treatment well    Behavior During Therapy  [facility] for tasks assessed/performed       Past Medical History:  Diagnosis Date  . 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 Hyperlipidemia   . Hypertension   . Lymphedema     Past Surgical History:  Procedure Laterality Date  . 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 removal of melanoma of left foot/amputation of toes 1&25 Jul 2006  . WRIST FRACTURE SURGERY     73 years old-set    There were no vitals filed for this visit.  Subjective Assessment - 07/16/19 1611    Subjective  Pt reports that his spouse due to her own medical issues is experiencing difficulty performing tasks at the moment. He left his farrow wrap on since his last visit until today. (he was educated that he should not do this and needs to remove at night for skin integrity and to prevent increased edema).    Pertinent History  Lymphedema-has compression machine and uses stockings (wife helps); hx of PD/Parkinson's Plus Syndrone; malignant melanoma, hyperlipidemia, HTN, insomnia, obesity, hyperglycemia, dizziness, OSA, urinary urgency    Patient  Stated Goals  get back in stockings and to put his his shoes on    Currently in Pain?  No/denies    Multiple Pain Sites  No                  Outpatient Re[facility]02/[facility]  Lymphedema Life Impact Scale Total Score  14.71 %           [facility] Adult PT Treatment/Exercise - 07/16/19 0001      Manual Therapy   Manual Therapy  Edema management;Manual Lymphatic Drainage (MLD)    Edema Management  Farrow wrap with stocking was placed on BLE following MLD and edema massage with legs elevated with deeper pressure to push fluid into deep lymphatics with stimulation intermittently at popliteal lymph nodes.     Manual Lymphatic Drainage (MLD)  In supine: 5 diaphragmatic breathes. short neck, Bil axillary and inguinal lymph nodes, established BIl inguino-axillary anastomosis, medial to lateral L thigh then lateral L thigh to the knee, all surfaces of the lower leg and dorsum of the foot; then RLE medial to lateral thigh, lateral thigh, bottle neck at knee, posterior knee, all surfaces of the lower leg and dorsum of the foot; reworked all surfaces then deep abdominals.               PT Education -  07/16/19 1608    Education Details  Pt will wear farrow wraps on the Bil LE as he is able, remove at night and wear tributes on Bil LE. He will try to elevate his legs above hear level 30 min every day.    Person(s) Educated  Patient    Methods  Explanation    Comprehension  Verbalized understanding          PT Long Term Goals - 07/16/19 1605      PT LONG TERM GOAL #1   Title  Pt will decrease lymphedema volume in bilateral LEs to allow pt and wife to donn stockings at home for continued self care    Baseline  Pt and wife are unable to don Farrow wrap consistently at this time.    Time  3    Period  Weeks    Status  On-going    Target Date  08/06/19            Plan - 07/16/19 1601    Clinical Impression Statement  Pt has  met most of his goals but he and spouse are still unable to don Farrow wrap independently at home. Pt presents with no wraps on either leg. Rubor has improved on BLE. Pt legs were propped up for MLD this session with deeper edema massage intermixed with MLD in elevated position with great results sigh significant creping by the end of session. MLD was perfomred for the anterior trunk and BLE from groin to knee Bil. Farrow wrap was placed on Bil LE with good fit this session following MLD and deeper edema massage in elevation. Compressoin sock used and wrap for more even compression. Pt will benefit from continued skilled physical therapy services 2x/week for 3 weeks at this time until he is able to don Farrow wraps at home consistently or is able to get help to don consistently to decrease risk for infection and immobility.    Personal Factors and Comorbidities  Age;Fitness;Comorbidity 2    Comorbidities  limited mobility, lymph node removal    Rehab Potential  Fair    Clinical Impairments Affecting Rehab Potential  history of parkinsons disease with limitations in ablilty to put on compression garments, see if pt likes Farrow wrap on LLE and if he wants to order for the R.    PT Frequency  3x / week    PT Duration  6 weeks    PT Treatment/Interventions  ADLs/Self Care Home Management;Therapeutic activities;Therapeutic exercise;Patient/family education;Orthotic Fit/Training;Manual techniques;Vasopneumatic Device;Compression bandaging;Manual lymph drainage;Functional mobility training;DME Instruction    PT Next Visit Plan  continue CDT of bil LEs until ind with velcro garments and pump    Consulted and Agree with Plan of Care  Patient       Patient will benefit from skilled therapeutic intervention in order to improve the following deficits and impairments:  Decreased skin integrity, Postural dysfunction, Obesity, Increased edema, Difficulty walking, Decreased mobility  Visit Diagnosis: Lymphedema,  not elsewhere classified  Other abnormalities of gait and mobility  Unsteadiness on feet  Muscle weakness (generalized)     Problem List Patient Active Problem List   Diagnosis Date Noted  . Strain of right biceps 09/12/2017  . Lymphedema 02/10/2017  . BPH associated with nocturia 05/16/2016  . Senile purpura ([place]) 05/03/2016  . OSA (obstructive sleep apnea) 12/28/2015  . Hypersomnia 11/15/2015  . Cough 11/15/2015  . 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 Dizziness 12/30/2014  . Diastolic dysfunction 18/59/0931  . Former  smoker 04/29/2014  . Chronic venous insufficiency 02/20/2014  . Hyperglycemia 08/02/2012  . Morbid obesity ([place]) 07/13/2010  . History of malignant melanoma. left leg 03/09/2009  . Insomnia 10/03/2007  . Hyperlipidemia 12/13/2006  . Essential hypertension 12/13/2006    Ander Purpura, PT 07/16/2019, 4:13 PM  [place], [place], [address] Phone: [phone]   Fax:  [phone]  Name: Nicholas [name] MRN: [id number] Date of Birth: 05-22-1946

## 2019-07-21 ENCOUNTER — Other Ambulatory Visit: Payer: Self-pay

## 2019-07-21 ENCOUNTER — Ambulatory Visit: Payer: PPO

## 2019-07-21 DIAGNOSIS — I890 Lymphedema, not elsewhere classified: Secondary | ICD-10-CM

## 2019-07-21 DIAGNOSIS — R2681 Unsteadiness on feet: Secondary | ICD-10-CM

## 2019-07-21 DIAGNOSIS — R2689 Other abnormalities of gait and mobility: Secondary | ICD-10-CM

## 2019-07-21 DIAGNOSIS — M6281 Muscle weakness (generalized): Secondary | ICD-10-CM

## 2019-07-21 NOTE — Therapy (Signed)
[place], [place], [address] Phone: [phone]   Fax:  [phone]  Physical Therapy Treatment  Patient Details  Name: Nicholas [name] MRN: [id number] Date of Birth: 1945-08-11 Referring Provider (PT): Dr. [name]   Encounter Date: 07/21/2019  PT End of Session - 07/21/19 1519    Visit Number  21    Number of Visits  25    Date for PT Re-Evaluation  07/28/19    Authorization Type  [employer] (Medicare)-will need 10th visit progress note    PT Start Time  1510    PT Stop Time  1555    PT Time Calculation (min)  45 min    Activity Tolerance  Patient tolerated treatment well    Behavior During Therapy  [facility] for tasks assessed/performed       Past Medical History:  Diagnosis Date  . 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 Hyperlipidemia   . Hypertension   . Lymphedema     Past Surgical History:  Procedure Laterality Date  . 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 removal of melanoma of left foot/amputation of toes 1&25 Jul 2006  . WRIST FRACTURE SURGERY     73 years old-set    There were no vitals filed for this visit.  Subjective Assessment - 07/21/19 1519    Subjective  pt repoorts that they did not put his tributes on great last night. He states that he woke up and put his pump on then came to physical therapy. He did not wear his velcro wrap to physical therapy which is why his legs are so swollen at this time.    Pertinent History  Lymphedema-has compression machine and uses stockings (wife helps); hx of PD/Parkinson's Plus Syndrone; malignant melanoma, hyperlipidemia, HTN, insomnia, obesity, hyperglycemia, dizziness, OSA, urinary urgency    Patient Stated Goals  get back in stockings and to put his his shoes on    Currently in Pain?  No/denies    Multiple Pain Sites  No                  Outpatient Re[facility]02/2018 [facility]  Lymphedema Life Impact Scale Total Score  14.71 %           [facility] Adult PT Treatment/Exercise - 07/21/19 0001      Manual Therapy   Manual Therapy  Edema management;Manual Lymphatic Drainage (MLD)    Edema Management  Farrow wrap with stocking was placed on BLE following MLD and edema massage with legs elevated with deeper pressure to push fluid into deep lymphatics with stimulation intermittently at popliteal lymph nodes.     Manual Lymphatic Drainage (MLD)  In supine: 5 diaphragmatic breathes. short neck, Bil axillary and inguinal lymph nodes, established BIl inguino-axillary anastomosis, medial to lateral L thigh then lateral L thigh to the knee, all surfaces of the lower leg and dorsum of the foot; then RLE medial to lateral thigh, lateral thigh, bottle neck at knee, posterior knee, all surfaces of the lower leg and dorsum of the foot; reworked all surfaces then deep abdominals. Using deeper pressure and spending extra time on the Bil lower legs due to fluid build up that responds well to increased pressure. Significant improvement with crepeing of the skin Bil following MLD.  PT Education - 07/21/19 1559    Education Details  Pt will continue to wear farrow wraps on the BIL LE as he is able, remove at night and wear tribute. He will try to elevate his leaves 30 min above heart level and use his pumps.    Person(s) Educated  Patient    Methods  Explanation    Comprehension  Verbalized understanding          PT Long Term Goals - 07/16/19 1605      PT LONG TERM GOAL #1   Title  Pt will decrease lymphedema volume in bilateral LEs to allow pt and wife to donn stockings at home for continued self care    Baseline  Pt and wife are unable to don Farrow wrap consistently at this time.    Time  3    Period  Weeks    Status  On-going    Target Date  08/06/19            Plan - 07/21/19 1518    Clinical  Impression Statement  Pt continues with significant improvmeent using deeper edema massage intermingled with MLD in elevated position with continued significant results as demonstrated by crepeing in Bil Lower legs by end of session and easily fitting Farrow wraps. MLD was performed for the anterior trunk and Bil LE from groin to knee. Farrow wraps were placedon Bil lE with good fit continueing following MLD and deeper edema massage in elevation. Compression sock foot sock and farrow wrap were uesd for more even compresion at the ankle. Pt will benefit from continued POC at this time until he can get more consistent help at home for donning compression to decrease risk for infection and immobility. .    Personal Factors and Comorbidities  Age;Fitness;Comorbidity 2    Comorbidities  limited mobility, lymph node removal    Rehab Potential  Fair    Clinical Impairments Affecting Rehab Potential  history of parkinsons disease with limitations in ablilty to put on compression garments, see if pt likes Farrow wrap on LLE and if he wants to order for the R.    PT Frequency  3x / week    PT Duration  6 weeks    PT Treatment/Interventions  ADLs/Self Care Home Management;Therapeutic activities;Therapeutic exercise;Patient/family education;Orthotic Fit/Training;Manual techniques;Vasopneumatic Device;Compression bandaging;Manual lymph drainage;Functional mobility training;DME Instruction    PT Next Visit Plan  continue CDT of bil LEs until ind with velcro garments and pump    Consulted and Agree with Plan of Care  Patient       Patient will benefit from skilled therapeutic intervention in order to improve the following deficits and impairments:  Decreased skin integrity, Postural dysfunction, Obesity, Increased edema, Difficulty walking, Decreased mobility  Visit Diagnosis: Lymphedema, not elsewhere classified  Other abnormalities of gait and mobility  Unsteadiness on feet  Muscle weakness  (generalized)     Problem List Patient Active Problem List   Diagnosis Date Noted  . Strain of right biceps 09/12/2017  . Lymphedema 02/10/2017  . BPH associated with nocturia 05/16/2016  . Senile purpura ([place]) 05/03/2016  . OSA (obstructive sleep apnea) 12/28/2015  . Hypersomnia 11/15/2015  . Cough 11/15/2015  . 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 Dizziness 12/30/2014  . Diastolic dysfunction 123456  . Former smoker 04/29/2014  . Chronic venous insufficiency 02/20/2014  . Hyperglycemia 08/02/2012  . Morbid obesity ([place]) 07/13/2010  . History of malignant melanoma. left leg 03/09/2009  . Insomnia 10/03/2007  .  Hyperlipidemia 12/13/2006  . Essential hypertension 12/13/2006    Ander Purpura , PT 07/21/2019, 4:02 PM  [place], [place], [address] Phone: [phone]   Fax:  [phone]  Name: BLAYZ OCLAIR MRN: [id number] Date of Birth: July 27, 1945

## 2019-07-23 ENCOUNTER — Ambulatory Visit: Payer: PPO

## 2019-07-23 ENCOUNTER — Other Ambulatory Visit: Payer: Self-pay

## 2019-07-23 DIAGNOSIS — R2681 Unsteadiness on feet: Secondary | ICD-10-CM

## 2019-07-23 DIAGNOSIS — R2689 Other abnormalities of gait and mobility: Secondary | ICD-10-CM

## 2019-07-23 DIAGNOSIS — M6281 Muscle weakness (generalized): Secondary | ICD-10-CM

## 2019-07-23 DIAGNOSIS — I890 Lymphedema, not elsewhere classified: Secondary | ICD-10-CM

## 2019-07-23 NOTE — Therapy (Signed)
[place], [place], [address] Phone: [phone]   Fax:  [phone]  Physical Therapy Treatment  Patient Details  Name: Nicholas [name] MRN: [id number] Date of Birth: 02-04-1946 Referring Provider (PT): Dr. [name]   Encounter Date: 07/23/2019  PT End of Session - 07/23/19 1405    Visit Number  22    Number of Visits  25    Date for PT Re-Evaluation  07/28/19    Authorization Type  [employer] (Medicare)-will need 10th visit progress note    PT Start Time  1400    PT Stop Time  1453    PT Time Calculation (min)  53 min    Activity Tolerance  Patient tolerated treatment well    Behavior During Therapy  [facility] for tasks assessed/performed       Past Medical History:  Diagnosis Date  . 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 Hyperlipidemia   . Hypertension   . Lymphedema     Past Surgical History:  Procedure Laterality Date  . 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 removal of melanoma of left foot/amputation of toes 1&25 Jul 2006  . WRIST FRACTURE SURGERY     73 years old-set    There were no vitals filed for this visit.  Subjective Assessment - 07/23/19 1405    Subjective  Pt reports that he took his farrow wrap off at 10 am this morning and has not done his pump today.    Pertinent History  Lymphedema-has compression machine and uses stockings (wife helps); hx of PD/Parkinson's Plus Syndrone; malignant melanoma, hyperlipidemia, HTN, insomnia, obesity, hyperglycemia, dizziness, OSA, urinary urgency    Patient Stated Goals  get back in stockings and to put his his shoes on    Currently in Pain?  No/denies    Multiple Pain Sites  No                  [facility]patient [facility]  Lymphedema Life Impact Scale Total Score  14.71 %           [facility] Adult PT Treatment/Exercise  - 07/23/19 0001      Manual Therapy   Manual Therapy  Edema management;Manual Lymphatic Drainage (MLD)    Edema Management  Farrow wrap with stocking was placed on BLE following MLD and edema massage with legs elevated    Manual Lymphatic Drainage (MLD)  In supine: 5 diaphragmatic breathes. short neck, Bil axillary and inguinal lymph nodes, established BIl inguino-axillary anastomosis, medial to lateral L thigh then lateral L thigh to the knee, all surfaces of the lower leg and dorsum of the foot; then RLE medial to lateral thigh, lateral thigh, bottle neck at knee, posterior knee, all surfaces of the lower leg and dorsum of the foot; reworked all surfaces then deep abdominals. Using deeper pressure and spending extra time on the Bil lower legs due to fluid build up that continues to respond well to increased pressure. Significant improvement with crepeing of the skin Bil following MLD.              PT Education - 07/23/19 1524    Education Details  Pt will continue to wear farrow wrapson the BIL LE as he is able, remove at night and wear tribute. He will tro to elevate his legs 30 min above heart level  and use his pumps daily.    Person(s) Educated  Patient    Methods  Explanation    Comprehension  Verbalized understanding          PT Long Term Goals - 07/16/19 1605      PT LONG TERM GOAL #1   Title  Pt will decrease lymphedema volume in bilateral LEs to allow pt and wife to donn stockings at home for continued self care    Baseline  Pt and wife are unable to don Farrow wrap consistently at this time.    Time  3    Period  Weeks    Status  On-going    Target Date  08/06/19            Plan - 07/23/19 1404    Clinical Impression Statement  Pt continues with significant improvement using deeper edema massage intermingled with MLD in elevated position with continued significant reuslts as demonstrated by creping in Bil LE by end of session and easily fitting Farrow Wraps Bil.  Pt skin coloring has improved with significant less purpling at the anterior distal lower legs and improve rubor. MLD wasperformed for the anterior trunk and Bil LE from going to knee. Farrow wraps wereplaced on Bil LE with good fit continuing following MLD. Compression bootie with sock and farrow wrap were used for more even compression at the ankle. Pt will benefit from continued POC at this time until he is able to get more consistent help at home donning his compression sock to decrease risk for infection and immobility.    Personal Factors and Comorbidities  Age;Fitness;Comorbidity 2    Comorbidities  limited mobility, lymph node removal    Rehab Potential  Fair    Clinical Impairments Affecting Rehab Potential  history of parkinsons disease with limitations in ablilty to put on compression garments, see if pt likes Farrow wrap on LLE and if he wants to order for the R.    PT Frequency  3x / week    PT Duration  6 weeks    PT Treatment/Interventions  ADLs/Self Care Home Management;Therapeutic activities;Therapeutic exercise;Patient/family education;Orthotic Fit/Training;Manual techniques;Vasopneumatic Device;Compression bandaging;Manual lymph drainage;Functional mobility training;DME Instruction    PT Next Visit Plan  continue CDT of bil LEs until ind with velcro garments and pump    Consulted and Agree with Plan of Care  Patient       Patient will benefit from skilled therapeutic intervention in order to improve the following deficits and impairments:  Decreased skin integrity, Postural dysfunction, Obesity, Increased edema, Difficulty walking, Decreased mobility  Visit Diagnosis: Lymphedema, not elsewhere classified  Other abnormalities of gait and mobility  Unsteadiness on feet  Muscle weakness (generalized)     Problem List Patient Active Problem List   Diagnosis Date Noted  . Strain of right biceps 09/12/2017  . Lymphedema 02/10/2017  . BPH associated with nocturia  05/16/2016  . Senile purpura ([place]) 05/03/2016  . OSA (obstructive sleep apnea) 12/28/2015  . Hypersomnia 11/15/2015  . Cough 11/15/2015  . 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 Dizziness 12/30/2014  . Diastolic dysfunction 123456  . Former smoker 04/29/2014  . Chronic venous insufficiency 02/20/2014  . Hyperglycemia 08/02/2012  . Morbid obesity ([place]) 07/13/2010  . History of malignant melanoma. left leg 03/09/2009  . Insomnia 10/03/2007  . Hyperlipidemia 12/13/2006  . Essential hypertension 12/13/2006    Ander Purpura, PT 07/23/2019, 3:28 PM  [place], [place],  [id number] Phone: [phone]   Fax:  [phone]  Name: Nicholas [name] MRN: [id number] Date of Birth: [birth year]

## 2019-07-28 ENCOUNTER — Encounter: Payer: Self-pay | Admitting: Family Medicine

## 2019-07-28 ENCOUNTER — Ambulatory Visit (INDEPENDENT_AMBULATORY_CARE_PROVIDER_SITE_OTHER): Payer: PPO | Admitting: Family Medicine

## 2019-07-28 VITALS — BP 132/82

## 2019-07-28 DIAGNOSIS — G232 Striatonigral degeneration: Secondary | ICD-10-CM | POA: Diagnosis not present

## 2019-07-28 DIAGNOSIS — I10 Essential (primary) hypertension: Secondary | ICD-10-CM | POA: Diagnosis not present

## 2019-07-28 DIAGNOSIS — I890 Lymphedema, not elsewhere classified: Secondary | ICD-10-CM | POA: Diagnosis not present

## 2019-07-28 NOTE — Progress Notes (Signed)
Phone [phone] Virtual visit via Video note   Subjective:  Chief complaint: Chief Complaint  Patient presents with  . Discuss compression stockings    This visit type was conducted due to national recommendations for restrictions regarding the COVID-19 Pandemic (e.g. social distancing).  This format is felt to be most appropriate for this patient at this time balancing risks to patient and risks to population by having him in for in person visit.  No physical exam was performed (except for noted visual exam or audio findings with Telehealth visits).    Our team/I connected with Nicholas [name] at  4:20 PM EST by a video enabled telemedicine application (doxy.me or caregility through epic) and verified that I am speaking with the correct person using two identifiers.  Location patient: Home-[place] Location provider: [facility], office Persons participating in the virtual visit:  patient  Our team/I discussed the limitations of evaluation and management by telemedicine and the availability of in person appointments. In light of current covid-19 pandemic, patient also understands that we are trying to protect them by minimizing in office contact if at all possible.  The patient expressed consent for telemedicine visit and agreed to proceed. Patient understands insurance will be billed.   ROS-no fever chills reported  Past Medical History-  Patient Active Problem List   Diagnosis Date Noted  . Lymphedema 02/10/2017    Priority: High  . 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 History of malignant melanoma. left leg 03/09/2009    Priority: High  . BPH associated with nocturia 05/16/2016    Priority: Medium  . OSA (obstructive sleep apnea) 12/28/2015    Priority: Medium  . Diastolic dysfunction 123456    Priority: Medium  . Chronic venous insufficiency 02/20/2014    Priority: Medium  . Hyperglycemia 08/02/2012    Priority: Medium  . Hyperlipidemia  12/13/2006    Priority: Medium  . Essential hypertension 12/13/2006    Priority: Medium  . Senile purpura ([place]) 05/03/2016    Priority: Low  . Hypersomnia 11/15/2015    Priority: Low  . Former smoker 04/29/2014    Priority: Low  . Morbid obesity ([place]) 07/13/2010    Priority: Low  . Insomnia 10/03/2007    Priority: Low  . Strain of right biceps 09/12/2017  . Cough 11/15/2015  . Dizziness 12/30/2014    Medications- reviewed and updated Current Outpatient Medications  Medication Sig Dispense Refill  . carbidopa-levodopa (SINEMET IR) 25-100 MG tablet TAKE 2 TABLETS AT 7AM,11AM, 3PM,  7PM. 720 tablet 1  . diphenhydrAMINE (BENADRYL) 25 mg capsule Take 25 mg by mouth at bedtime.    . fesoterodine (TOVIAZ) 8 MG TB24 tablet Take 8 mg by mouth daily.    Marland Kitchen ibuprofen (ADVIL,MOTRIN) 200 MG tablet Take 400 mg by mouth every 6 (six) hours as needed for moderate pain.    . rosuvastatin (CRESTOR) 10 MG tablet Take 1 tablet (10 mg total) by mouth daily. 90 tablet 3  . triamcinolone cream (KENALOG) 0.1 %      No current facility-administered medications for this visit.     Objective:  BP 132/82  self reported vitals Gen: NAD, resting comfortably Lungs: nonlabored, normal respiratory rate  Skin: appears dry, no obvious rash     Assessment and Plan   # lymphedema/Parkinson's S: I recently received a message from patient's physical therapist in the lymphedema clinic "We have been treatment Nicholas [name] at the lymphedema clinic and he is starting to have  more and more difficulty with mobility and self care between him and his wife. We have mentioned to him about getting home help and he states his insurance told him he needs a doctor referral. I think he may reach out to you about this but I wanted to let you now that he would benefit from some home help at this time especially for lymphedema and mobility.    Nicholas [name], PT "  I discussed these concerns with patient today and he admits his main  issue is that he cannot get his compression stockings on-his wife also struggles in helping him.  They have some equipment that is supposed to help from physical therapy/lymphedema clinic but they have not been able to get this to work yet.  Patient feels like if he could reach his foot he can get the socks on but he cannot reach the sock-obesity likely plays a role  Also patient is trying to work on obesity and has purchased a bike to use in the home-wife is worried about his safety getting on to this-it is not a recumbent bike A/P: Patient with Parkinson's and obesity with decreased mobility-would benefit from physical therapy to help with mobility.  Specifically he would benefit from assistance with transfers onto his stationary bike as well as assistance with getting his compression stockings on-I think his wife and him can help get the socks off.  Ordered both physical therapy and home health aide to see what he qualifies for-I suspect he may need a combination of these 2 to help him.  From note to home health care referral: " Patient would benefit from PT for strengthening exercises for mobility- has parkinson's. May benefit from OT/PT help with getting onto home bike/transfers.   The big issue is patient needs help with getting compression stockings on or facilitating getting them on easier with aide of wife. If this is not possible would benefit from home health aide in helping him get these on as much as possible. Wife can help with compression wraps but these are not adequate for long term use. "  #hypertension S: Diet controlled-patient has been able to come off medication since diagnosis of Parkinson's BP Readings from Last 3 Encounters:  07/28/19 132/82  04/14/19 126/78  01/07/19 130/82  A/P: Well-controlled without medication-continue to monitor without meds  -Of note patient would benefit from updating labs next visit with last set of labs just over 54-year-old now  Recommended  follow up: We did not schedule planned follow-up Future Appointments  Date Time Provider [place]  11/27/2019  2:30 PM [name], [name], DO [facility] None    Lab/Order associations:   ICD-10-CM   1. Lymphedema  I89.0 Ambulatory referral to Home Health  2. 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 Essential hypertension  I10    Time Spent: 23 minutes of total time (4:31 PM- 4:47 PM, 7:57 pm- 8:04 PM  ) was spent on the date of the encounter performing the following actions: chart review prior to seeing the patient, obtaining history, performing a medically necessary exam, counseling on the treatment plan, placing orders, and documenting in our EHR.   Return precautions advised.  [name], MD

## 2019-07-28 NOTE — Patient Instructions (Signed)
Health Maintenance Due  Topic Date Due  . COLONOSCOPY  04/10/2019    Depression screen [facility] 2/9 04/14/2019 01/07/2019 06/19/2018  Decreased Interest 0 0 0  Down, Depressed, Hopeless 0 0 0  PHQ - 2 Score 0 0 0  Altered sleeping - 0 -  Tired, decreased energy - 0 -  Change in appetite - 0 -  Feeling bad or failure about yourself  - 0 -  Trouble concentrating - 0 -  Moving slowly or fidgety/restless - 0 -  Suicidal thoughts - 0 -  PHQ-9 Score - 0 -  Difficult doing work/chores - Not difficult at all -  Some recent data might be hidden    Recommended follow up: No follow-ups on file.

## 2019-07-29 ENCOUNTER — Ambulatory Visit: Payer: PPO | Admitting: Family Medicine

## 2019-07-30 ENCOUNTER — Other Ambulatory Visit: Payer: Self-pay

## 2019-07-30 DIAGNOSIS — Z1211 Encounter for screening for malignant neoplasm of colon: Secondary | ICD-10-CM

## 2019-07-30 DIAGNOSIS — Z1212 Encounter for screening for malignant neoplasm of rectum: Secondary | ICD-10-CM

## 2019-08-04 ENCOUNTER — Ambulatory Visit: Payer: PPO | Attending: Family Medicine

## 2019-08-04 ENCOUNTER — Other Ambulatory Visit: Payer: Self-pay

## 2019-08-04 DIAGNOSIS — R2689 Other abnormalities of gait and mobility: Secondary | ICD-10-CM | POA: Insufficient documentation

## 2019-08-04 DIAGNOSIS — I890 Lymphedema, not elsewhere classified: Secondary | ICD-10-CM | POA: Diagnosis not present

## 2019-08-04 DIAGNOSIS — M6281 Muscle weakness (generalized): Secondary | ICD-10-CM

## 2019-08-04 DIAGNOSIS — R2681 Unsteadiness on feet: Secondary | ICD-10-CM | POA: Diagnosis not present

## 2019-08-04 NOTE — Therapy (Signed)
[place], [place], [address] Phone: [phone]   Fax:  [phone]                Physical Therapy Progress Note  Progress Note Reporting Period 07/16/19 to 08/04/19  See note below for Objective Data and Assessment of Progress/Goals.      Patient Details  Name: Nicholas [name] MRN: [id number] Date of Birth: 1946-07-01 Referring Provider (PT): Dr. [name]   Encounter Date: 08/04/2019  PT End of Session - 08/04/19 1423    Visit Number  23    Number of Visits  31    Date for PT Re-Evaluation  10/06/19    Authorization Type  [employer] (Medicare)-will need 10th visit progress note    PT Start Time  1405    PT Stop Time  1458    PT Time Calculation (min)  53 min    Activity Tolerance  Patient tolerated treatment well    Behavior During Therapy  [facility] for tasks assessed/performed       Past Medical History:  Diagnosis Date  . Cancer [facility]) Jan 2008   skin; hx of melanoma left foot/ amputation of toes 1&2   . Hyperlipidemia   . Hypertension   . Lymphedema     Past Surgical History:  Procedure Laterality Date  . 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 removal of melanoma of left foot/amputation of toes 1&25 Jul 2006  . WRIST FRACTURE SURGERY     74 years old-set    There were no vitals filed for this visit.  Subjective Assessment - 08/04/19 1424    Subjective  Pt states that he is unable to get home care at this time. He reports that his wife assisted him with donning his FarrowWrap today.    Pertinent History  Lymphedema-has compression machine and uses stockings (wife helps); hx of PD/Parkinson's Plus Syndrone; malignant melanoma, hyperlipidemia, HTN, insomnia, obesity, hyperglycemia, dizziness, OSA, urinary urgency    Patient Stated Goals  get back in stockings and to put his his shoes on    Currently in Pain?  No/denies    Multiple Pain Sites  No             LYMPHEDEMA/ONCOLOGY QUESTIONNAIRE - 08/04/19 1447      Right Lower Extremity Lymphedema   At Midpatella/Popliteal Crease  50.9 cm    30 cm Proximal to Floor at Lateral Plantar Foot  50 cm    20 cm Proximal to Floor at Lateral Plantar Foot  45.8 1    10  cm Proximal to Floor at Lateral Malleoli  42 cm    5 cm Proximal to 1st MTP Joint  32.8 cm    Across MTP Joint  28.8 cm    Around Proximal Great Toe  9 cm      Left Lower Extremity Lymphedema   At Midpatella/Popliteal Crease  46.6 cm    30 cm Proximal to Floor at Lateral Plantar Foot  49 cm    20 cm Proximal to Floor at Lateral Plantar Foot  46.6 cm    10 cm Proximal to Floor at Lateral Malleoli  35.6 cm    5 cm Proximal to 1st MTP Joint  31 cm    Across MTP Joint  29 cm    Around Proximal Great Toe  10.2 cm           Outpatient Re[facility]  Lymphedema Life Impact Scale Total Score  14.71 %           [facility] Adult PT Treatment/Exercise - 08/04/19 0001      Manual Therapy   Manual Therapy  Edema management;Manual Lymphatic Drainage (MLD)    Edema Management  Farrow wrap with stocking was placed on BLE following MLD and edema massage with legs elevated    Manual Lymphatic Drainage (MLD)  In supine: 5 diaphragmatic breathes. short neck, Bil axillary and inguinal lymph nodes, established BIl inguino-axillary anastomosis, medial to lateral L thigh then lateral L thigh to the knee, all surfaces of the lower leg and dorsum of the foot; then RLE medial to lateral thigh, lateral thigh, bottle neck at knee, posterior knee, all surfaces of the lower leg and dorsum of the foot; reworked all surfaces then deep abdominals. Using deeper pressure and spending extra time on the Bil lower legs due to fluid build up that continues to respond well to increased pressure. Significant improvement with crepeing of the skin Bil following MLD.              PT Education -  08/04/19 1604    Education Details  Pt will cotninue to wear farrow wrap on BIl LE as he is able, remove at night and wear tribue. H will try to elevate his legs above heart level and use his pumps 2x/day.    Person(s) Educated  Patient    Methods  Explanation    Comprehension  Verbalized understanding       PT Short Term Goals - 08/04/19 1604      PT SHORT TERM GOAL #1   Title  --    Baseline  --        PT Long Term Goals - 08/04/19 1604      PT LONG TERM GOAL #1   Title  Pt will decrease lymphedema volume in bilateral LEs to allow pt and wife to donn stockings at home for continued self care    Baseline  Pt and wife are unable to don Farrow wrap consistently at this time.    Time  8    Period  Weeks    Status  On-going    Target Date  09/29/19            Plan - 08/04/19 1423    Clinical Impression Statement  Pt is continuing to impove with measurements and compliance with compression wear. MLD continued to be performed with deeper edema massage in elevated position with continued signficiatn results as demonstrated by creping in Bil LE by end of session. Pt skin coloring contnues to improve with no purple noticed and rubor is improving as well at the distal lower legs. Farrow wraps were placed on BIl LE with good fit at end of session. Pt will benefit from continued physical therapy 1x/week for 8 weeks until pt plateaus in reduction in order to ensure that he as adequately fitting compression to decrease risk for re-occurance.    Personal Factors and Comorbidities  Age;Fitness;Comorbidity 2    Comorbidities  limited mobility, lymph node removal    Rehab Potential  Fair    Clinical Impairments Affecting Rehab Potential  history of parkinsons disease with limitations in ablilty to put on compression garments, see if pt likes Farrow wrap on LLE and if he wants to order for the R.    PT Frequency  3x / week    PT Duration  6 weeks  PT Treatment/Interventions  ADLs/Self Care  Home Management;Therapeutic activities;Therapeutic exercise;Patient/family education;Orthotic Fit/Training;Manual techniques;Vasopneumatic Device;Compression bandaging;Manual lymph drainage;Functional mobility training;DME Instruction    PT Next Visit Plan  continue CDT of bil LEs until ind with velcro garments and pump    Consulted and Agree with Plan of Care  Patient       Patient will benefit from skilled therapeutic intervention in order to improve the following deficits and impairments:  Decreased skin integrity, Postural dysfunction, Obesity, Increased edema, Difficulty walking, Decreased mobility  Visit Diagnosis: Lymphedema, not elsewhere classified  Other abnormalities of gait and mobility  Unsteadiness on feet  Muscle weakness (generalized)     Problem List Patient Active Problem List   Diagnosis Date Noted  . Strain of right biceps 09/12/2017  . Lymphedema 02/10/2017  . BPH associated with nocturia 05/16/2016  . Senile purpura ([place]) 05/03/2016  . OSA (obstructive sleep apnea) 12/28/2015  . Hypersomnia 11/15/2015  . Cough 11/15/2015  . 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 Dizziness 12/30/2014  . Diastolic dysfunction 123456  . Former smoker 04/29/2014  . Chronic venous insufficiency 02/20/2014  . Hyperglycemia 08/02/2012  . Morbid obesity ([place]) 07/13/2010  . History of malignant melanoma. left leg 03/09/2009  . Insomnia 10/03/2007  . Hyperlipidemia 12/13/2006  . Essential hypertension 12/13/2006    Ander Purpura, PT 08/04/2019, 4:06 PM  [place], [place], [address] Phone: [phone]   Fax:  [phone]  Name: Nicholas [name] MRN: [id number] Date of Birth: [birth year]

## 2019-08-11 ENCOUNTER — Ambulatory Visit: Payer: PPO

## 2019-08-11 ENCOUNTER — Other Ambulatory Visit: Payer: Self-pay

## 2019-08-11 ENCOUNTER — Other Ambulatory Visit: Payer: Self-pay | Admitting: Neurology

## 2019-08-11 DIAGNOSIS — I890 Lymphedema, not elsewhere classified: Secondary | ICD-10-CM

## 2019-08-11 DIAGNOSIS — M6281 Muscle weakness (generalized): Secondary | ICD-10-CM

## 2019-08-11 DIAGNOSIS — R2689 Other abnormalities of gait and mobility: Secondary | ICD-10-CM

## 2019-08-11 DIAGNOSIS — R2681 Unsteadiness on feet: Secondary | ICD-10-CM

## 2019-08-11 NOTE — Therapy (Signed)
[place], [place], [address] Phone: [phone]   Fax:  [phone]  Physical Therapy Treatment  Patient Details  Name: Nicholas [name] MRN: [id number] Date of Birth: [birth year] Referring Provider (PT): Dr. [name]   Encounter Date: 08/11/2019  PT End of Session - 08/11/19 1610    Visit Number  24    Number of Visits  31    Date for PT Re-Evaluation  10/06/19    Authorization Type  [employer] (Medicare)-will need 10th visit progress note    PT Start Time  1607    PT Stop Time  1700    PT Time Calculation (min)  53 min    Activity Tolerance  Patient tolerated treatment well    Behavior During Therapy  [facility] for tasks assessed/performed       Past Medical History:  Diagnosis Date  . Cancer [facility]) Jan 2008   skin; hx of melanoma left foot/ amputation of toes 1&2   . Hyperlipidemia   . Hypertension   . Lymphedema     Past Surgical History:  Procedure Laterality Date  . 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 removal of melanoma of left foot/amputation of toes 1&25 Jul 2006  . WRIST FRACTURE SURGERY     74 years old-set    There were no vitals filed for this visit.  Subjective Assessment - 08/11/19 1608    Subjective  Pt reports that he wants to try to get his flat knit stockings on and his shoes on. If he is able to get his shoes and flat knit stockings on he wants to hire someone to help his do this at home.    Pertinent History  Lymphedema-has compression machine and uses stockings (wife helps); hx of PD/Parkinson's Plus Syndrone; malignant melanoma, hyperlipidemia, HTN, insomnia, obesity, hyperglycemia, dizziness, OSA, urinary urgency    Patient Stated Goals  get back in stockings and to put his his shoes on    Currently in Pain?  No/denies    Multiple Pain Sites  No                  [facility]   Lymphedema Life Impact Scale Total Score  14.71 %           [facility] Adult PT Treatment/Exercise - 08/11/19 0001      Manual Therapy   Manual Therapy  Edema management;Manual Lymphatic Drainage (MLD)    Edema Management  Farrow wrap with stocking was placed on BLE following MLD and edema massage with legs elevated. Attempted to place exosleeve on LLE but it is much too tight still and was unable to don shoe    Manual Lymphatic Drainage (MLD)  In supine: 5 diaphragmatic breathes. short neck, Bil axillary and inguinal lymph nodes, established BIl inguino-axillary anastomosis, medial to lateral L thigh then lateral L thigh to the knee, all surfaces of the lower leg and dorsum of the foot; then RLE medial to lateral thigh, lateral thigh, bottle neck at knee, posterior knee, all surfaces of the lower leg and dorsum of the foot; reworked all surfaces then deep abdominals. Using deeper pressure and spending extra time on the Bil lower legs due to fluid build up that continues to respond well to increased pressure. Significant improvement with crepeing of the skin Bil following MLD.              PT Education -  08/11/19 1730    Education Details  Pt will continue to wear farrow wrapon Bil LE as he is able to remove at night and wear tribute. He will try to elevate his legs above heart level and use his pumps 2x/day as wearing tributes at night and removing compression socks.    Person(s) Educated  Patient    Methods  Explanation    Comprehension  Verbalized understanding       PT Short Term Goals - 08/04/19 1604      PT SHORT TERM GOAL #1   Title  --    Baseline  --        PT Long Term Goals - 08/04/19 1604      PT LONG TERM GOAL #1   Title  Pt will decrease lymphedema volume in bilateral LEs to allow pt and wife to donn stockings at home for continued self care    Baseline  Pt and wife are unable to don Farrow wrap consistently at this time.    Time  8    Period  Weeks    Status   On-going    Target Date  09/29/19            Plan - 08/11/19 1609    Clinical Impression Statement  Pt continues to improve in size and skin color. MLD continued to be performed with deeper edema massage in elevated position with continued significant results as demonstrated by creping in Bil LE by end of session. Pt wanted to try exostrong compression sleeve on the LLE on pt request and shoe on the L foot but had too much edema still to fit. Farrow wraps were placed on Bil at end of session. Pt will benefit from continued POC as needed until he stops reducing or has help at home to place compression garment on arm.    Personal Factors and Comorbidities  Age;Fitness;Comorbidity 2    Comorbidities  limited mobility, lymph node removal    Rehab Potential  Fair    Clinical Impairments Affecting Rehab Potential  history of parkinsons disease with limitations in ablilty to put on compression garments, see if pt likes Farrow wrap on LLE and if he wants to order for the R.    PT Frequency  3x / week    PT Duration  6 weeks    PT Treatment/Interventions  ADLs/Self Care Home Management;Therapeutic activities;Therapeutic exercise;Patient/family education;Orthotic Fit/Training;Manual techniques;Vasopneumatic Device;Compression bandaging;Manual lymph drainage;Functional mobility training;DME Instruction    PT Next Visit Plan  continue CDT of bil LEs until ind with velcro garments and pump    Consulted and Agree with Plan of Care  Patient       Patient will benefit from skilled therapeutic intervention in order to improve the following deficits and impairments:  Decreased skin integrity, Postural dysfunction, Obesity, Increased edema, Difficulty walking, Decreased mobility  Visit Diagnosis: Lymphedema, not elsewhere classified  Other abnormalities of gait and mobility  Unsteadiness on feet  Muscle weakness (generalized)     Problem List Patient Active Problem List   Diagnosis Date Noted   . Strain of right biceps 09/12/2017  . Lymphedema 02/10/2017  . BPH associated with nocturia 05/16/2016  . Senile purpura ([place]) 05/03/2016  . OSA (obstructive sleep apnea) 12/28/2015  . Hypersomnia 11/15/2015  . Cough 11/15/2015  . 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 Dizziness 12/30/2014  . Diastolic dysfunction 123456  . Former smoker 04/29/2014  . Chronic venous insufficiency 02/20/2014  . Hyperglycemia 08/02/2012  .  Morbid obesity ([place]) 07/13/2010  . History of malignant melanoma. left leg 03/09/2009  . Insomnia 10/03/2007  . Hyperlipidemia 12/13/2006  . Essential hypertension 12/13/2006    Ander Purpura, PT 08/11/2019, 5:35 PM  [place], [place], [address] Phone: [phone]   Fax:  [phone]  Name: JAYSIN LAURIDSEN MRN: [id number] Date of Birth: Jun 27, 1946

## 2019-08-14 ENCOUNTER — Ambulatory Visit: Payer: PPO | Attending: Internal Medicine

## 2019-08-14 DIAGNOSIS — Z23 Encounter for immunization: Secondary | ICD-10-CM | POA: Insufficient documentation

## 2019-08-14 NOTE — Progress Notes (Signed)
   Covid-19 Vaccination Clinic  Name:  Nicholas [name]    MRN: [id number] DOB: 19-Jun-1946  08/14/2019  [name] was observed post Covid-19 immunization for 15 minutes without incidence. He was provided with Vaccine Information Sheet and instruction to access the V-Safe system.   [name] was instructed to call 911 with any severe reactions post vaccine: Marland Kitchen Difficulty breathing  . Swelling of your face and throat  . A fast heartbeat  . A bad rash all over your body  . Dizziness and weakness    Immunizations Administered    Name Date Dose VIS Date Route   Pfizer COVID-19 Vaccine 08/14/2019  3:52 PM 0.3 mL 07/04/2019 Intramuscular   Manufacturer: [place]   Lot: [id number]   [place]: [id number]

## 2019-08-18 ENCOUNTER — Other Ambulatory Visit: Payer: Self-pay

## 2019-08-18 ENCOUNTER — Ambulatory Visit: Payer: PPO

## 2019-08-18 DIAGNOSIS — M6281 Muscle weakness (generalized): Secondary | ICD-10-CM

## 2019-08-18 DIAGNOSIS — R2689 Other abnormalities of gait and mobility: Secondary | ICD-10-CM

## 2019-08-18 DIAGNOSIS — I890 Lymphedema, not elsewhere classified: Secondary | ICD-10-CM | POA: Diagnosis not present

## 2019-08-18 DIAGNOSIS — R2681 Unsteadiness on feet: Secondary | ICD-10-CM

## 2019-08-18 NOTE — Therapy (Signed)
[place], [place], [address] Phone: [phone]   Fax:  [phone]  Physical Therapy Treatment  Patient Details  Name: Nicholas [name] MRN: [id number] Date of Birth: [birth year] Referring Provider (PT): Dr. [name]   Encounter Date: 08/18/2019  PT End of Session - 08/18/19 1426    Visit Number  25    Number of Visits  31    Date for PT Re-Evaluation  10/06/19    Authorization Type  [employer] (Medicare)-will need 10th visit progress note    PT Start Time  1402    PT Stop Time  1500    PT Time Calculation (min)  58 min    Activity Tolerance  Patient tolerated treatment well    Behavior During Therapy  [facility] for tasks assessed/performed       Past Medical History:  Diagnosis Date  . 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 Hyperlipidemia   . Hypertension   . Lymphedema     Past Surgical History:  Procedure Laterality Date  . 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 removal of melanoma of left foot/amputation of toes 1&25 Jul 2006  . WRIST FRACTURE SURGERY     74 years old-set    There were no vitals filed for this visit.  Subjective Assessment - 08/18/19 1427    Subjective  Pt reports that he has been doing well and wearing his tributes at night except for last night. He states that his legs are feeling better overall.    Pertinent History  Lymphedema-has compression machine and uses stockings (wife helps); hx of PD/Parkinson's Plus Syndrone; malignant melanoma, hyperlipidemia, HTN, insomnia, obesity, hyperglycemia, dizziness, OSA, urinary urgency    Patient Stated Goals  get back in stockings and to put his his shoes on    Currently in Pain?  No/denies    Multiple Pain Sites  No            LYMPHEDEMA/ONCOLOGY QUESTIONNAIRE - 08/18/19 1428      Right Lower Extremity Lymphedema   At Midpatella/Popliteal Crease  46.3 cm    30 cm  Proximal to Floor at Lateral Plantar Foot  49.9 cm    20 cm Proximal to Floor at Lateral Plantar Foot  45.7 1    10  cm Proximal to Floor at Lateral Malleoli  39.2 cm    5 cm Proximal to 1st MTP Joint  30.7 cm    Across MTP Joint  28.8 cm    Around Proximal Great Toe  8.7 cm      Left Lower Extremity Lymphedema   At Midpatella/Popliteal Crease  43 cm    30 cm Proximal to Floor at Lateral Plantar Foot  49.8 cm    20 cm Proximal to Floor at Lateral Plantar Foot  47.8 cm    10 cm Proximal to Floor at Lateral Malleoli  33.8 cm    5 cm Proximal to 1st MTP Joint  30.8 cm    Across MTP Joint  29.4 cm    Around Proximal Great Toe  9.7 cm           [facility]patient [facility]  Lymphedema Life Impact Scale Total Score  14.71 %           [facility] Adult PT Treatment/Exercise - 08/18/19 0001      Manual Therapy  Manual Therapy  Edema management;Manual Lymphatic Drainage (MLD)    Edema Management  Farrow wrap with stocking was placed on BLE following MLD and edema massage with legs elevated. Pt circumferential measurements were taken this session.     Manual Lymphatic Drainage (MLD)  In supine: 5 diaphragmatic breathes. short neck, Bil axillary and inguinal lymph nodes, established BIl inguino-axillary anastomosis, medial to lateral L thigh then lateral L thigh to the knee, all surfaces of the lower leg and dorsum of the foot; then RLE medial to lateral thigh, lateral thigh, bottle neck at knee, posterior knee, all surfaces of the lower leg and dorsum of the foot; reworked all surfaces then deep abdominals. Using deeper pressure and spending extra time on the Bil lower legs due to fluid build up that continues to respond well to increased pressure. Significant improvement with crepeing of the skin Bil following MLD.              PT Education - 08/18/19 1541    Education Details  Pt will continue with current Farrow wrap on Bil LE  while up and Tribute at night. He will continue with vasopneumatic pump daily.    Person(s) Educated  Patient    Methods  Explanation    Comprehension  Verbalized understanding       PT Short Term Goals - 08/04/19 1604      PT SHORT TERM GOAL #1   Title  --    Baseline  --        PT Long Term Goals - 08/04/19 1604      PT LONG TERM GOAL #1   Title  Pt will decrease lymphedema volume in bilateral LEs to allow pt and wife to donn stockings at home for continued self care    Baseline  Pt and wife are unable to don Farrow wrap consistently at this time.    Time  8    Period  Weeks    Status  On-going    Target Date  09/29/19            Plan - 08/18/19 1426    Clinical Impression Statement  Pt circumferential measurements taken today with slight improvement this session from last time he was measuremed. He continues to decrease following MLD with creping noted at the BIl feet and lower legs following MLD in elevated position. Discussed purchasing new compression socks for the Farrow wrap due to these compression socks are starting to stretch out and current Farrow wrap and socks are beginning to become too large due to pt circumferential measurements have decreased. He will reach a point where he is no longer reducing due to garment is too large Pt will benefit from continued POC at this time until he has plateaued with reduction or has help at home to place compression garment.    Personal Factors and Comorbidities  Age;Fitness;Comorbidity 2    Comorbidities  limited mobility, lymph node removal    Rehab Potential  Fair    Clinical Impairments Affecting Rehab Potential  history of parkinsons disease with limitations in ablilty to put on compression garments, see if pt likes Farrow wrap on LLE and if he wants to order for the R.    PT Frequency  3x / week    PT Duration  6 weeks    PT Treatment/Interventions  ADLs/Self Care Home Management;Therapeutic activities;Therapeutic  exercise;Patient/family education;Orthotic Fit/Training;Manual techniques;Vasopneumatic Device;Compression bandaging;Manual lymph drainage;Functional mobility training;DME Instruction    PT Next Visit Plan  continue  CDT of bil LEs until ind with velcro garments and pump    Consulted and Agree with Plan of Care  Patient    Family Member Consulted  wife       Patient will benefit from skilled therapeutic intervention in order to improve the following deficits and impairments:  Decreased skin integrity, Postural dysfunction, Obesity, Increased edema, Difficulty walking, Decreased mobility  Visit Diagnosis: Lymphedema, not elsewhere classified  Other abnormalities of gait and mobility  Unsteadiness on feet  Muscle weakness (generalized)     Problem List Patient Active Problem List   Diagnosis Date Noted  . Strain of right biceps 09/12/2017  . Lymphedema 02/10/2017  . BPH associated with nocturia 05/16/2016  . Senile purpura ([place]) 05/03/2016  . OSA (obstructive sleep apnea) 12/28/2015  . Hypersomnia 11/15/2015  . Cough 11/15/2015  . 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 Dizziness 12/30/2014  . Diastolic dysfunction 123456  . Former smoker 04/29/2014  . Chronic venous insufficiency 02/20/2014  . Hyperglycemia 08/02/2012  . Morbid obesity ([place]) 07/13/2010  . History of malignant melanoma. left leg 03/09/2009  . Insomnia 10/03/2007  . Hyperlipidemia 12/13/2006  . Essential hypertension 12/13/2006    Ander Purpura, PT 08/18/2019, 3:44 PM  [place], [place], [address] Phone: [phone]   Fax:  [phone]  Name: JERIMIAH OSTERBERG MRN: [id number] Date of Birth: 1946-07-15

## 2019-08-25 ENCOUNTER — Ambulatory Visit: Payer: PPO | Attending: Family Medicine

## 2019-08-25 ENCOUNTER — Other Ambulatory Visit: Payer: Self-pay

## 2019-08-25 DIAGNOSIS — M6281 Muscle weakness (generalized): Secondary | ICD-10-CM | POA: Insufficient documentation

## 2019-08-25 DIAGNOSIS — R2681 Unsteadiness on feet: Secondary | ICD-10-CM | POA: Diagnosis not present

## 2019-08-25 DIAGNOSIS — R2689 Other abnormalities of gait and mobility: Secondary | ICD-10-CM | POA: Diagnosis not present

## 2019-08-25 DIAGNOSIS — I890 Lymphedema, not elsewhere classified: Secondary | ICD-10-CM | POA: Insufficient documentation

## 2019-08-25 NOTE — Therapy (Signed)
[place], [place], [address] Phone: [phone]   Fax:  [phone]  Physical Therapy Treatment  Patient Details  Name: Nicholas [name] MRN: [id number] Date of Birth: [birth year] Referring Provider (PT): Dr. [name]   Encounter Date: 08/25/2019  PT End of Session - 08/25/19 1517    Visit Number  26    Number of Visits  31    Date for PT Re-Evaluation  10/06/19    Authorization Type  [employer] (Medicare)-will need 10th visit progress note    PT Start Time  1507    PT Stop Time  1600    PT Time Calculation (min)  53 min    Activity Tolerance  Patient tolerated treatment well    Behavior During Therapy  [facility] for tasks assessed/performed       Past Medical History:  Diagnosis Date  . 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 Hyperlipidemia   . Hypertension   . Lymphedema     Past Surgical History:  Procedure Laterality Date  . 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 removal of melanoma of left foot/amputation of toes 1&25 Jul 2006  . WRIST FRACTURE SURGERY     74 years old-set    There were no vitals filed for this visit.  Subjective Assessment - 08/25/19 1518    Subjective  Pt states that he has been continueing with performing his pump 1x/day and wearing his tributes as night with Farrow wrap during the day. He states that his legs are feeling looser and less heavy.    Pertinent History  Lymphedema-has compression machine and uses stockings (wife helps); hx of PD/Parkinson's Plus Syndrone; malignant melanoma, hyperlipidemia, HTN, insomnia, obesity, hyperglycemia, dizziness, OSA, urinary urgency    Patient Stated Goals  get back in stockings and to put his his shoes on    Currently in Pain?  No/denies    Multiple Pain Sites  No                  Out[facility]   Lymphedema Life Impact Scale Total Score  14.71 %           [facility] Adult PT Treatment/Exercise - 08/25/19 0001      Manual Therapy   Manual Therapy  Edema management;Manual Lymphatic Drainage (MLD)    Manual therapy comments  Pt was assisted finding the correct sock to order and information was printed.     Edema Management  Farrow wrap with stocking was placed on BLE following MLD and edema massage with legs elevated.    Manual Lymphatic Drainage (MLD)  In supine: 5 diaphragmatic breathes. short neck, Bil axillary and inguinal lymph nodes, established BIl inguino-axillary anastomosis, medial to lateral L thigh then lateral L thigh to the knee, all surfaces of the lower leg and dorsum of the foot; then RLE medial to lateral thigh, lateral thigh, bottle neck at knee, posterior knee, all surfaces of the lower leg and dorsum of the foot; reworked all surfaces then deep abdominals. Continuing with effleurage/deeper effleurage and extra time on the Bil lower legs.              PT Education - 08/25/19 1521    Education Details  Pt will continue with his current home management. He will order new compression socks and wear this with his farrow  wrap for better compression at the foot. Discussed possibly using the pump 2x/day.    Person(s) Educated  Patient    Methods  Explanation    Comprehension  Verbalized understanding       PT Short Term Goals - 08/04/19 1604      PT SHORT TERM GOAL #1   Title  --    Baseline  --        PT Long Term Goals - 08/04/19 1604      PT LONG TERM GOAL #1   Title  Pt will decrease lymphedema volume in bilateral LEs to allow pt and wife to donn stockings at home for continued self care    Baseline  Pt and wife are unable to don Farrow wrap consistently at this time.    Time  8    Period  Weeks    Status  On-going    Target Date  09/29/19            Plan - 08/25/19 1517    Clinical Impression Statement  Pt presents to physical therapy  this session with significant softening of fibrotic tissue at the Bil distal lower legs. He continues to decrease in fibrosis and edema at Bil medial/lateral malleoli. He was assisted this session with finding the correct compression sock to wear under his Farrow wrap and this information was printed for him. He will continue to wear his [place]ie [place] wra pand use the new socks once they arrive. Discussed trialing his pump 2x/day for an hour as well as wearing bicycle shorts at night due to continued fibrosis at posterior distal thighs with R>L. MLd was performed today in elevated position with continued extra time and deeper massageper formed at this area; slightly lighter at the distal anterior portion of the L lower leg due to slight purple discoloration. Overall color has improved significantly in pt legs. Discussed possibly needing smaller size of Farrow wrap in the near future. Pt will benefit from continued POC at this time.    Personal Factors and Comorbidities  Age;Fitness;Comorbidity 2    Comorbidities  limited mobility, lymph node removal    Rehab Potential  Fair    Clinical Impairments Affecting Rehab Potential  history of parkinsons disease with limitations in ablilty to put on compression garments, see if pt likes Farrow wrap on LLE and if he wants to order for the R.    PT Frequency  3x / week    PT Duration  6 weeks    PT Treatment/Interventions  ADLs/Self Care Home Management;Therapeutic activities;Therapeutic exercise;Patient/family education;Orthotic Fit/Training;Manual techniques;Vasopneumatic Device;Compression bandaging;Manual lymph drainage;Functional mobility training;DME Instruction    PT Next Visit Plan  continue CDT of bil LEs until ind with velcro garments and pump    Consulted and Agree with Plan of Care  Patient       Patient will benefit from skilled therapeutic intervention in order to improve the following deficits and impairments:  Decreased skin integrity, Postural  dysfunction, Obesity, Increased edema, Difficulty walking, Decreased mobility  Visit Diagnosis: Lymphedema, not elsewhere classified  Other abnormalities of gait and mobility  Unsteadiness on feet  Muscle weakness (generalized)     Problem List Patient Active Problem List   Diagnosis Date Noted  . Strain of right biceps 09/12/2017  . Lymphedema 02/10/2017  . BPH associated with nocturia 05/16/2016  . Senile purpura ([place]) 05/03/2016  . OSA (obstructive sleep apnea) 12/28/2015  . Hypersomnia 11/15/2015  . Cough 11/15/2015  . 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  Dizziness 12/30/2014  . Diastolic dysfunction 123456  . Former smoker 04/29/2014  . Chronic venous insufficiency 02/20/2014  . Hyperglycemia 08/02/2012  . Morbid obesity ([place]) 07/13/2010  . History of malignant melanoma. left leg 03/09/2009  . Insomnia 10/03/2007  . Hyperlipidemia 12/13/2006  . Essential hypertension 12/13/2006    Ander Purpura, PT 08/25/2019, 4:03 PM  [place], [place], [address] Phone: [phone]   Fax:  [phone]  Name: Nicholas [name] MRN: [id number] Date of Birth: 05/03/1946

## 2019-08-26 DIAGNOSIS — Z1212 Encounter for screening for malignant neoplasm of rectum: Secondary | ICD-10-CM | POA: Diagnosis not present

## 2019-08-26 DIAGNOSIS — Z1211 Encounter for screening for malignant neoplasm of colon: Secondary | ICD-10-CM | POA: Diagnosis not present

## 2019-08-26 LAB — COLOGUARD: Cologuard: NEGATIVE

## 2019-09-01 ENCOUNTER — Other Ambulatory Visit: Payer: Self-pay

## 2019-09-01 ENCOUNTER — Ambulatory Visit: Payer: PPO

## 2019-09-01 DIAGNOSIS — I890 Lymphedema, not elsewhere classified: Secondary | ICD-10-CM

## 2019-09-01 DIAGNOSIS — R2689 Other abnormalities of gait and mobility: Secondary | ICD-10-CM

## 2019-09-01 DIAGNOSIS — M6281 Muscle weakness (generalized): Secondary | ICD-10-CM

## 2019-09-01 DIAGNOSIS — R2681 Unsteadiness on feet: Secondary | ICD-10-CM

## 2019-09-01 NOTE — Therapy (Signed)
[place], [place], [address] Phone: [phone]   Fax:  [phone]  Physical Therapy Treatment  Patient Details  Name: Nicholas [name] MRN: [id number] Date of Birth: [birth year] Referring Provider (PT): Dr. [name]   Encounter Date: 09/01/2019  PT End of Session - 09/01/19 1420    Visit Number  27    Number of Visits  31    Date for PT Re-Evaluation  10/06/19    Authorization Type  [employer] (Medicare)-will need 10th visit progress note    PT Start Time  1407    PT Stop Time  1505    PT Time Calculation (min)  58 min    Behavior During Therapy  [facility] for tasks assessed/performed       Past Medical History:  Diagnosis Date  . 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 Hyperlipidemia   . Hypertension   . Lymphedema     Past Surgical History:  Procedure Laterality Date  . 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 removal of melanoma of left foot/amputation of toes 1&25 Jul 2006  . WRIST FRACTURE SURGERY     74 years old-set    There were no vitals filed for this visit.  Subjective Assessment - 09/01/19 1421    Subjective  Pt reports that he continues to use his pump daily and wear his tributes at night, farrow during the day. He states that yesterday he did not wear his farrow during the day just the sock wiht compression foot piece which could indicate increased edema in BLE.    Pertinent History  Lymphedema-has compression machine and uses stockings (wife helps); hx of PD/Parkinson's Plus Syndrone; malignant melanoma, hyperlipidemia, HTN, insomnia, obesity, hyperglycemia, dizziness, OSA, urinary urgency    Patient Stated Goals  get back in stockings and to put his his shoes on    Currently in Pain?  No/denies    Multiple Pain Sites  No            LYMPHEDEMA/ONCOLOGY QUESTIONNAIRE - 09/01/19 1413      Right Lower Extremity Lymphedema   At Midpatella/Popliteal Crease  42 cm    30 cm Proximal to Floor at Lateral Plantar Foot  50.7 cm    20 cm Proximal to Floor at Lateral Plantar Foot  46.4 1    10  cm Proximal to Floor at Lateral Malleoli  36.8 cm    5 cm Proximal to 1st MTP Joint  31 cm    Across MTP Joint  28 cm    Around Proximal Great Toe  8.5 cm      Left Lower Extremity Lymphedema   At Midpatella/Popliteal Crease  41.5 cm    30 cm Proximal to Floor at Lateral Plantar Foot  51 cm    20 cm Proximal to Floor at Lateral Plantar Foot  49 cm    10 cm Proximal to Floor at Lateral Malleoli  33.5 cm    5 cm Proximal to 1st MTP Joint  29.3 cm    Across MTP Joint  29.5 cm    Around Proximal Great Toe  9 cm           Outpatient [facility]  Lymphedema Life Impact Scale Total Score  14.71 %           [facility] Adult PT Treatment/Exercise - 09/01/19  0001      Manual Therapy   Manual Therapy  Edema management;Manual Lymphatic Drainage (MLD)    Manual therapy comments  Pt was assisted with donning farrow wraps and new socks this session after MLD    Edema Management  Circumferential measurements were taken this session. Farrow wrap with stocking was placed on BLE following MLD and edema massage with legs elevated.    Manual Lymphatic Drainage (MLD)  In supine: 5 diaphragmatic breathes. short neck, Bil axillary and inguinal lymph nodes, established BIl inguino-axillary anastomosis, medial to lateral L thigh then lateral L thigh to the knee, all surfaces of the lower leg and dorsum of the foot; then RLE medial to lateral thigh, lateral thigh, bottle neck at knee, posterior knee, all surfaces of the lower leg and dorsum of the foot; reworked all surfaces then deep abdominals. Continuing with effleurage/deeper effleurage and extra time on the Bil lower legs.              PT Education - 09/01/19 1514    Education Details  re-iterated education on thigh  compression. To decrease the edema in Bil thighs/lower extremities.    Person(s) Educated  Patient    Comprehension  Verbalized understanding       PT Short Term Goals - 08/04/19 1604      PT SHORT TERM GOAL #1   Title  --    Baseline  --        PT Long Term Goals - 08/04/19 1604      PT LONG TERM GOAL #1   Title  Pt will decrease lymphedema volume in bilateral LEs to allow pt and wife to donn stockings at home for continued self care    Baseline  Pt and wife are unable to don Farrow wrap consistently at this time.    Time  8    Period  Weeks    Status  On-going    Target Date  09/29/19            Plan - 09/01/19 1411    Clinical Impression Statement  Pt fibrotic tissue has significantly improved since his initial evaluation. He has a significant decrease in edema at the knees with slight increase in the lower legs without the fibrosis. Discussed the importance of thigh compression including possible use of edema wear for ease of donning in order to help move fluid out of the lower legs. Pt was provided with information. MLD was performed for the BLE in elevation with continued softening; extra time was spent at the medial and posterior thighs today due to continued fibrosis in this area. Farrow wrap was donned following MLD with new socks. Discussed that these socks will be harder to don for spouse but will help contain fluid better. Pt will benefit from continued POC at this time.    Personal Factors and Comorbidities  Age;Fitness;Comorbidity 2    Comorbidities  limited mobility, lymph node removal    Rehab Potential  Fair    Clinical Impairments Affecting Rehab Potential  history of parkinsons disease with limitations in ablilty to put on compression garments, see if pt likes Farrow wrap on LLE and if he wants to order for the R.    PT Frequency  3x / week    PT Duration  6 weeks    PT Treatment/Interventions  ADLs/Self Care Home Management;Therapeutic  activities;Therapeutic exercise;Patient/family education;Orthotic Fit/Training;Manual techniques;Vasopneumatic Device;Compression bandaging;Manual lymph drainage;Functional mobility training;DME Instruction    PT Next Visit Plan  continue  CDT of bil LEs until ind with velcro garments and pump, ask about shorts or edema wear.    Consulted and Agree with Plan of Care  Patient       Patient will benefit from skilled therapeutic intervention in order to improve the following deficits and impairments:  Decreased skin integrity, Postural dysfunction, Obesity, Increased edema, Difficulty walking, Decreased mobility  Visit Diagnosis: Lymphedema, not elsewhere classified  Other abnormalities of gait and mobility  Unsteadiness on feet  Muscle weakness (generalized)     Problem List Patient Active Problem List   Diagnosis Date Noted  . Strain of right biceps 09/12/2017  . Lymphedema 02/10/2017  . BPH associated with nocturia 05/16/2016  . Senile purpura ([place]) 05/03/2016  . OSA (obstructive sleep apnea) 12/28/2015  . Hypersomnia 11/15/2015  . Cough 11/15/2015  . 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 Dizziness 12/30/2014  . Diastolic dysfunction 123456  . Former smoker 04/29/2014  . Chronic venous insufficiency 02/20/2014  . Hyperglycemia 08/02/2012  . Morbid obesity ([place]) 07/13/2010  . History of malignant melanoma. left leg 03/09/2009  . Insomnia 10/03/2007  . Hyperlipidemia 12/13/2006  . Essential hypertension 12/13/2006    Ander Purpura, PT 09/01/2019, 4:56 PM  [place], [place], [address] Phone: [phone]   Fax:  [phone]  Name: Nicholas [name] MRN: [id number] Date of Birth: 03-Dec-1945

## 2019-09-04 ENCOUNTER — Ambulatory Visit: Payer: PPO | Attending: Internal Medicine

## 2019-09-04 DIAGNOSIS — Z23 Encounter for immunization: Secondary | ICD-10-CM | POA: Insufficient documentation

## 2019-09-04 NOTE — Progress Notes (Signed)
   Covid-19 Vaccination Clinic  Name:  Nicholas [name]    MRN: [id number] DOB: September 09, 1945  09/04/2019  [name] was observed post Covid-19 immunization for 15 minutes without incidence. He was provided with Vaccine Information Sheet and instruction to access the V-Safe system.   [name] was instructed to call 911 with any severe reactions post vaccine: Marland Kitchen Difficulty breathing  . Swelling of your face and throat  . A fast heartbeat  . A bad rash all over your body  . Dizziness and weakness    Immunizations Administered    Name Date Dose VIS Date Route   Pfizer COVID-19 Vaccine 09/04/2019  5:31 PM 0.3 mL 07/04/2019 Intramuscular   Manufacturer: [place]   Lot: [id number]   NDC: [id number]

## 2019-09-15 ENCOUNTER — Ambulatory Visit: Payer: PPO

## 2019-09-15 ENCOUNTER — Other Ambulatory Visit: Payer: Self-pay

## 2019-09-15 DIAGNOSIS — I890 Lymphedema, not elsewhere classified: Secondary | ICD-10-CM

## 2019-09-15 DIAGNOSIS — M6281 Muscle weakness (generalized): Secondary | ICD-10-CM

## 2019-09-15 DIAGNOSIS — R2689 Other abnormalities of gait and mobility: Secondary | ICD-10-CM

## 2019-09-15 DIAGNOSIS — R2681 Unsteadiness on feet: Secondary | ICD-10-CM

## 2019-09-15 NOTE — Therapy (Signed)
[place], [place], [address] Phone: [phone]   Fax:  [phone]  Physical Therapy Treatment  Patient Details  Name: Nicholas [name] MRN: [id number] Date of Birth: 1945/10/07 Referring Provider (PT): Dr. [name]   Encounter Date: 09/15/2019  PT End of Session - 09/15/19 1458    Visit Number  28    Number of Visits  31    Date for PT Re-Evaluation  10/06/19    Authorization Type  [employer] (Medicare)-will need 10th visit progress note    PT Start Time  1405    PT Stop Time  1505    PT Time Calculation (min)  60 min    Activity Tolerance  Patient tolerated treatment well    Behavior During Therapy  [facility] for tasks assessed/performed       Past Medical History:  Diagnosis Date  . 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 Hyperlipidemia   . Hypertension   . Lymphedema     Past Surgical History:  Procedure Laterality Date  . 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 removal of melanoma of left foot/amputation of toes 1&25 Jul 2006  . WRIST FRACTURE SURGERY     74 years old-set    There were no vitals filed for this visit.  Subjective Assessment - 09/15/19 1456    Subjective  Pt and spouse report that he has had a really bad week and has not been caring for his legs and managing lymphedema at home due to various reasons including oral dentistry, deaths of friends, 2nd covid shot, power outages and an episode when pt states he almost did a "split" which made him very sore.    Pertinent History  Lymphedema-has compression machine and uses stockings (wife helps); hx of PD/Parkinson's Plus Syndrone; malignant melanoma, hyperlipidemia, HTN, insomnia, obesity, hyperglycemia, dizziness, OSA, urinary urgency    Patient Stated Goals  get back in stockings and to put his his shoes on    Currently in Pain?  No/denies    Multiple Pain Sites  No             LYMPHEDEMA/ONCOLOGY QUESTIONNAIRE - 09/15/19 1451      Right Lower Extremity Lymphedema   At Midpatella/Popliteal Crease  45.5 cm    30 cm Proximal to Floor at Lateral Plantar Foot  52 cm    20 cm Proximal to Floor at Lateral Plantar Foot  46.5 1    10  cm Proximal to Floor at Lateral Malleoli  38.5 cm    5 cm Proximal to 1st MTP Joint  30.3 cm    Across MTP Joint  28.7 cm    Around Proximal Great Toe  8.9 cm      Left Lower Extremity Lymphedema   At Midpatella/Popliteal Crease  43 cm    30 cm Proximal to Floor at Lateral Plantar Foot  53 cm    20 cm Proximal to Floor at Lateral Plantar Foot  48.7 cm    10 cm Proximal to Floor at Lateral Malleoli  33.4 cm    5 cm Proximal to 1st MTP Joint  30.3 cm    Across MTP Joint  28.5 cm    Around Proximal Great Toe  8.8 cm           Outpatient [facility] from [facility]  Lymphedema Life Impact Scale Total  Score  14.71 %           [facility] Adult PT Treatment/Exercise - 09/15/19 0001      Manual Therapy   Manual Therapy  Edema management;Manual Lymphatic Drainage (MLD)    Manual therapy comments  Pt was assisted with donning farrow wraps and new socks this session after MLD    Edema Management  Circumferential measurements were taken this session. Farrow wrap with stocking was placed on BLE following MLD and edema massage with legs elevated.    Manual Lymphatic Drainage (MLD)  In supine: short neck, Bil axillary and inguinal lymph nodes, established BIl inguino-axillary anastomosis, medial to lateral L thigh then lateral L thigh to the knee, all surfaces of the lower leg and dorsum of the foot; then RLE medial to lateral thigh, lateral thigh, bottle neck at knee, posterior knee, all surfaces of the lower leg and dorsum of the foot; reworked all surfaces then deep abdominals. Continuing with effleurage/deeper effleurage and extra time on the Bil lower legs.              PT  Education - 09/15/19 1458    Education Details  . Pt will continue with pump and garment at home. He will return shorts that are too small.    Person(s) Educated  Patient    Methods  Explanation    Comprehension  Verbalized understanding       PT Short Term Goals - 08/04/19 1604      PT SHORT TERM GOAL #1   Title  --    Baseline  --        PT Long Term Goals - 08/04/19 1604      PT LONG TERM GOAL #1   Title  Pt will decrease lymphedema volume in bilateral LEs to allow pt and wife to donn stockings at home for continued self care    Baseline  Pt and wife are unable to don Farrow wrap consistently at this time.    Time  8    Period  Weeks    Status  On-going    Target Date  09/29/19            Plan - 09/15/19 1457    Clinical Impression Statement  Pt presents to physical therapy after 2 week absence with visually increased edema in his Bil LE. CIrcumferential measurements demonstrate a slight increase due to pt spouse states that pt was not performing lymphedema management at home over the past two weeks. Pt states that he has been very upset the past two weeks due to death of friends and that his legs have gotten very swollen. Pt had purchased some compression shorts but they were too small. MLD was performed for the BLE in elevation with continued softening; extra time was spent at the lower legs and posterior thighs this session. Farrow wrap was donned following MLD wiht the new socks due to pt and spouse have not been donning these secondary to difficulty and using the old socks. Dicussed need to continue with lymphedema management at home. Pt will benefit from continued POC at this time.    Personal Factors and Comorbidities  Age;Fitness;Comorbidity 2    Comorbidities  limited mobility, lymph node removal    Rehab Potential  Fair    Clinical Impairments Affecting Rehab Potential  history of parkinsons disease with limitations in ablilty to put on compression garments, see if  pt likes Farrow wrap on LLE and if he wants to order for  the R.    PT Frequency  1x / week    PT Duration  6 weeks    PT Treatment/Interventions  ADLs/Self Care Home Management;Therapeutic activities;Therapeutic exercise;Patient/family education;Orthotic Fit/Training;Manual techniques;Vasopneumatic Device;Compression bandaging;Manual lymph drainage;Functional mobility training;DME Instruction    PT Next Visit Plan  continue CDT of bil LEs until ind with velcro garments and pump, ask about shorts or edema wear.    Consulted and Agree with Plan of Care  Patient    Family Member Consulted  wife       Patient will benefit from skilled therapeutic intervention in order to improve the following deficits and impairments:  Decreased skin integrity, Postural dysfunction, Obesity, Increased edema, Difficulty walking, Decreased mobility  Visit Diagnosis: Lymphedema, not elsewhere classified  Other abnormalities of gait and mobility  Unsteadiness on feet  Muscle weakness (generalized)     Problem List Patient Active Problem List   Diagnosis Date Noted  . Strain of right biceps 09/12/2017  . Lymphedema 02/10/2017  . BPH associated with nocturia 05/16/2016  . Senile purpura ([place]) 05/03/2016  . OSA (obstructive sleep apnea) 12/28/2015  . Hypersomnia 11/15/2015  . Cough 11/15/2015  . 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 Dizziness 12/30/2014  . Diastolic dysfunction 123456  . Former smoker 04/29/2014  . Chronic venous insufficiency 02/20/2014  . Hyperglycemia 08/02/2012  . Morbid obesity ([place]) 07/13/2010  . History of malignant melanoma. left leg 03/09/2009  . Insomnia 10/03/2007  . Hyperlipidemia 12/13/2006  . Essential hypertension 12/13/2006    Ander Purpura, PT 09/15/2019, 4:04 PM  [place], [place], [address] Phone: [phone]   Fax:  [phone]  Name: Nicholas [name] MRN:  [id number] Date of Birth: July 21, 1946

## 2019-09-22 ENCOUNTER — Ambulatory Visit: Payer: PPO | Attending: Family Medicine

## 2019-09-22 ENCOUNTER — Other Ambulatory Visit: Payer: Self-pay

## 2019-09-22 DIAGNOSIS — I890 Lymphedema, not elsewhere classified: Secondary | ICD-10-CM | POA: Diagnosis not present

## 2019-09-22 DIAGNOSIS — R2689 Other abnormalities of gait and mobility: Secondary | ICD-10-CM | POA: Insufficient documentation

## 2019-09-22 DIAGNOSIS — M6281 Muscle weakness (generalized): Secondary | ICD-10-CM | POA: Diagnosis not present

## 2019-09-22 DIAGNOSIS — R2681 Unsteadiness on feet: Secondary | ICD-10-CM | POA: Insufficient documentation

## 2019-09-22 NOTE — Therapy (Signed)
[place], [place], [address] Phone: [phone]   Fax:  [phone]  Physical Therapy Treatment  Patient Details  Name: Nicholas [name] MRN: [id number] Date of Birth: [birth year] Referring Provider (PT): Dr. [name]   Encounter Date: 09/22/2019  PT End of Session - 09/22/19 1551    Visit Number  29    Number of Visits  31    Date for PT Re-Evaluation  10/06/19    Authorization Type  [employer] (Medicare)-will need 10th visit progress note    PT Start Time  1410    PT Stop Time  1510    PT Time Calculation (min)  60 min    Activity Tolerance  Patient tolerated treatment well    Behavior During Therapy  [facility] for tasks assessed/performed       Past Medical History:  Diagnosis Date  . 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 Hyperlipidemia   . Hypertension   . Lymphedema     Past Surgical History:  Procedure Laterality Date  . 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 removal of melanoma of left foot/amputation of toes 1&25 Jul 2006  . WRIST FRACTURE SURGERY     74 years old-set    There were no vitals filed for this visit.  Subjective Assessment - 09/22/19 1412    Subjective  Pt reports that he has a hole in his compression garment. He states that he has been using his pump and that when he gets out of the pump he can walk much better.    Pertinent History  Lymphedema-has compression machine and uses stockings (wife helps); hx of PD/Parkinson's Plus Syndrone; malignant melanoma, hyperlipidemia, HTN, insomnia, obesity, hyperglycemia, dizziness, OSA, urinary urgency    Patient Stated Goals  get back in stockings and to put his his shoes on    Currently in Pain?  No/denies    Multiple Pain Sites  No                  Outpatient Re[facility]  Lymphedema Life Impact Scale Total  Score  14.71 %           [facility] Adult PT Treatment/Exercise - 09/22/19 0001      Manual Therapy   Manual Therapy  Edema management;Manual Lymphatic Drainage (MLD)    Manual therapy comments  Pt was assisted with donning farrow wraps, pt and spouse were provided with information on compression shorts, pump company was notified due to pt requires higher level of pump for effectiveness and discussed possible use of edema wear at night due to tribute is too small and pt and spouse have some financial concerns due to constantly purchasing garments.     Manual Lymphatic Drainage (MLD)  In supine: short neck, Bil axillary and inguinal lymph nodes, established BIl inguino-axillary anastomosis, medial to lateral L thigh then lateral L thigh to the knee, all surfaces of the lower leg and dorsum of the foot; then RLE medial to lateral thigh, lateral thigh, bottle neck at knee, posterior knee, all surfaces of the lower leg and dorsum of the foot; reworked all surfaces then deep abdominals. Continuing with effleurage/deeper effleurage and extra time on the Bil lower legs.              PT Education - 09/22/19 1549    Education Details  Discussed trying to pump 2x/day as well as possibility of getting a pump that is at the abdominal level due to pump seems to be relatively ineffective. Provided pt and spouse with compression short information and possible garment types to use at night. As well as discussed the importance of elevating legs and exercise. Encouraged pt and spouse to get pt into an exercise class to help decrease fluid in the legs.    Person(s) Educated  Patient;Spouse    Methods  Explanation    Comprehension  Verbalized understanding       PT Short Term Goals - 08/04/19 1604      PT SHORT TERM GOAL #1   Title  --    Baseline  --        PT Long Term Goals - 08/04/19 1604      PT LONG TERM GOAL #1   Title  Pt will decrease lymphedema volume in bilateral LEs to allow pt and wife  to donn stockings at home for continued self care    Baseline  Pt and wife are unable to don Farrow wrap consistently at this time.    Time  8    Period  Weeks    Status  On-going    Target Date  09/29/19            Plan - 09/22/19 1551    Clinical Impression Statement  Pt presents to physical therapy with continued increase in edema since 2 week abscence. Discussed with pt and spouse that with the garments and pump at home he should be able to manage swelling at home and due to continued significant swelling and constant re-occurance of increased swelling he is most likely not getting enough compression at the correct level as well as pt is hindered by his difficulty in movement. Pt and spouse were provided with information on compression shorts to purchase and discussed getting pt involved in an exercise class in order to faciltiate lymphatic flow. They agreed for the pump company to be contacted to get a garment at the abdominal level rather than at the thigh level due to pt has edema into his thighs and needs a pump garment that goes above the level of swelling. Discussed possible low level compression from foot to thigh at night in order to help decrease fluid in the legs over night since tributes are too small and have been too small for a while MLD was performed for the BLE with continued softening during MLD. Extra time was spent at the lowe rlegs and posterior thighs this session. Farrow wrap was donned following MLD with the newer socks. Pt was advised to throw away the old socks due to there is a hole in the socks. Pt reduces easily with MLD and elevation in the clinic but due to inappropriate level of compression and lack os moblity due to pt co-morbidities and mobility difficulties pt continues with rebound edema. Pt will benefit from continued POC at this time.    Personal Factors and Comorbidities  Age;Fitness;Comorbidity 2    Comorbidities  limited mobility, lymph node removal     Rehab Potential  Fair    Clinical Impairments Affecting Rehab Potential  history of parkinsons disease with limitations in ablilty to put on compression garments, see if pt likes Farrow wrap on LLE and if he wants to order for the R.    PT Frequency  1x / week    PT Duration  6 weeks    PT  Treatment/Interventions  ADLs/Self Care Home Management;Therapeutic activities;Therapeutic exercise;Patient/family education;Orthotic Fit/Training;Manual techniques;Vasopneumatic Device;Compression bandaging;Manual lymph drainage;Functional mobility training;DME Instruction    PT Next Visit Plan  continue CDT of bil LEs until ind with velcro garments and pump, ask about shorts or edema wear.    Consulted and Agree with Plan of Care  Patient    Family Member Consulted  wife       Patient will benefit from skilled therapeutic intervention in order to improve the following deficits and impairments:  Decreased skin integrity, Postural dysfunction, Obesity, Increased edema, Difficulty walking, Decreased mobility  Visit Diagnosis: Lymphedema, not elsewhere classified  Other abnormalities of gait and mobility  Unsteadiness on feet  Muscle weakness (generalized)     Problem List Patient Active Problem List   Diagnosis Date Noted  . Strain of right biceps 09/12/2017  . Lymphedema 02/10/2017  . BPH associated with nocturia 05/16/2016  . Senile purpura ([place]) 05/03/2016  . OSA (obstructive sleep apnea) 12/28/2015  . Hypersomnia 11/15/2015  . Cough 11/15/2015  . 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 Dizziness 12/30/2014  . Diastolic dysfunction 123456  . Former smoker 04/29/2014  . Chronic venous insufficiency 02/20/2014  . Hyperglycemia 08/02/2012  . Morbid obesity ([place]) 07/13/2010  . History of malignant melanoma. left leg 03/09/2009  . Insomnia 10/03/2007  . Hyperlipidemia 12/13/2006  . Essential hypertension 12/13/2006    Ander Purpura, PT 09/22/2019, 4:01 PM  [place], [place], [address] Phone: [phone]   Fax:  [phone]  Name: EDBERT MARASIGAN MRN: [id number] Date of Birth: 02-Oct-1945

## 2019-09-29 ENCOUNTER — Ambulatory Visit: Payer: PPO

## 2019-09-29 ENCOUNTER — Other Ambulatory Visit: Payer: Self-pay

## 2019-09-29 DIAGNOSIS — I890 Lymphedema, not elsewhere classified: Secondary | ICD-10-CM

## 2019-09-29 DIAGNOSIS — M6281 Muscle weakness (generalized): Secondary | ICD-10-CM

## 2019-09-29 DIAGNOSIS — R2681 Unsteadiness on feet: Secondary | ICD-10-CM

## 2019-09-29 DIAGNOSIS — R2689 Other abnormalities of gait and mobility: Secondary | ICD-10-CM

## 2019-09-29 NOTE — Therapy (Signed)
[place], [place], [address] Phone: [phone]   Fax:  [phone]  Physical Therapy Progress Note Progress Note Reporting Period 08/04/2019 to 09/29/2019  See note below for Objective Data and Assessment of Progress/Goals.       Patient Details  Name: Nicholas [name] MRN: [id number] Date of Birth: [birth year] Referring Provider (PT): Dr. [name]   Encounter Date: 09/29/2019  PT End of Session - 09/29/19 1409    Visit Number  30    Number of Visits  41    Date for PT Re-Evaluation  11/10/19    Authorization Type  [employer] (Medicare)-will need 10th visit progress note    PT Start Time  1405    PT Stop Time  1500    PT Time Calculation (min)  55 min    Activity Tolerance  Patient tolerated treatment well    Behavior During Therapy  [facility] for tasks assessed/performed       Past Medical History:  Diagnosis Date  . 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 Hyperlipidemia   . Hypertension   . Lymphedema     Past Surgical History:  Procedure Laterality Date  . 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 removal of melanoma of left foot/amputation of toes 1&25 Jul 2006  . WRIST FRACTURE SURGERY     74 years old-set    There were no vitals filed for this visit.  Subjective Assessment - 09/29/19 1409    Subjective  Pt states that nothing has changed much since his last session.    Pertinent History  Lymphedema-has compression machine and uses stockings (wife helps); hx of PD/Parkinson's Plus Syndrone; malignant melanoma, hyperlipidemia, HTN, insomnia, obesity, hyperglycemia, dizziness, OSA, urinary urgency    Patient Stated Goals  get back in stockings and to put his his shoes on    Currently in Pain?  No/denies    Multiple Pain Sites  No            LYMPHEDEMA/ONCOLOGY QUESTIONNAIRE - 09/29/19 1446      Right Lower Extremity Lymphedema    10 cm Proximal to Suprapatella  60.8 cm    At Midpatella/Popliteal Crease  46 cm    30 cm Proximal to Floor at Lateral Plantar Foot  55.5 cm    20 cm Proximal to Floor at Lateral Plantar Foot  51.2 1    10  cm Proximal to Floor at Lateral Malleoli  42.5 cm    5 cm Proximal to 1st MTP Joint  33 cm    Across MTP Joint  31.1 cm    Around Proximal Great Toe  9 cm      Left Lower Extremity Lymphedema   10 cm Proximal to Suprapatella  62.3 cm    At Midpatella/Popliteal Crease  44 cm    30 cm Proximal to Floor at Lateral Plantar Foot  56.9 cm    20 cm Proximal to Floor at Lateral Plantar Foot  54 cm    10 cm Proximal to Floor at Lateral Malleoli  37 cm    5 cm Proximal to 1st MTP Joint  31.9 cm    Across MTP Joint  30 cm    Around Proximal Great Toe  9.9 cm           [facility]  Lymphedema Life Impact Scale  Total Score  14.71 %           [facility] Adult PT Treatment/Exercise - 09/29/19 0001      Manual Therapy   Manual Therapy  Edema management    Edema Management  Pt was assisted with donning his farrow wrap on BLE following circumferential measurements. Pt and spouse were educated on types of possible bed time garments due to Lelan Pons is much too small at this time. Pt was assisted with donning his compression shorts that he received in the mail and discussed possible ways for pt and spouse to don these easily at home. Pt spouse was present for education and step by step instruction while this physical therapist was assisting pt with donning in the clinic.              PT Education - 09/29/19 1710    Education Details  Pt and spouse were educated on POC. Including pt needs pump that is at the appropriate level. He states that his exercises classes are starting this Friday which pt and spouse were educated will help with swelling in the BLE. Pt and spouse were educated on how to don compression shorts with  demonstration and discussed possible ways to increase ease of donning including removing Farrow wraps and socks gliding against the skin only, and initiating over the lower legs with pt supine in bed so that his spouse does not have to crouch to help him get his compression shorts on.    Person(s) Educated  Patient;Spouse    Methods  Explanation    Comprehension  Verbalized understanding       PT Short Term Goals - 08/04/19 1604      PT SHORT TERM GOAL #1   Title  --    Baseline  --        PT Long Term Goals - 09/29/19 1717      PT LONG TERM GOAL #1   Title  Pt will decrease lymphedema volume in bilateral LEs to allow pt and wife to donn stockings at home for continued self care    Baseline  Continue with difficulty with night garments    Time  5    Period  Weeks    Status  On-going    Target Date  11/10/19            Plan - 09/29/19 1409    Clinical Impression Statement  Pt presents to physical therapy with increased circumferential measurements in his Bil LE. Discussed this with pt and spouse. Currently pt is in the process of trying to find the correct compression shorts which is difficult due to lack of mobility and assistance at home secondary to his spouse has difficulty helping him don many different types of garment. He has not yet found something that is effective and they are able to don at home. Pt has a vasopneumatic pump but it is the incorrect level due to pt has edema in his Bil LE up into his thighs and currently his pump is not at an abdominal level which is required for fluid up to the groin. Pt continue with rebound swelling in his Bil lower legs due to inadequate level of compression; this was discussed with pt and spouse. Pt will benefit from increasing physical therapy 2x/week for 5 weeks in order to obtain the correct pump and compression garments to discharge pt to home management at the end of this POC.    Personal Factors and Comorbidities  Age;Fitness;Comorbidity 2    Comorbidities  limited mobility, lymph node removal    Rehab Potential  Fair    Clinical Impairments Affecting Rehab Potential  history of parkinsons disease with limitations in ablilty to put on compression garments, see if pt likes Farrow wrap on LLE and if he wants to order for the R.    PT Frequency  1x / week    PT Duration  6 weeks    PT Treatment/Interventions  ADLs/Self Care Home Management;Therapeutic activities;Therapeutic exercise;Patient/family education;Orthotic Fit/Training;Manual techniques;Vasopneumatic Device;Compression bandaging;Manual lymph drainage;Functional mobility training;DME Instruction    PT Next Visit Plan  continue CDT of bil LEs until ind with velcro garments and pump, ask about shorts or edema wear.    Recommended Other Services  Vasopneumatic pump, night garments that fit appropriately, compression up to the hips/abdomen    Consulted and Agree with Plan of Care  Patient    Family Member Consulted  wife       Patient will benefit from skilled therapeutic intervention in order to improve the following deficits and impairments:  Decreased skin integrity, Postural dysfunction, Obesity, Increased edema, Difficulty walking, Decreased mobility  Visit Diagnosis: Lymphedema, not elsewhere classified  Other abnormalities of gait and mobility  Unsteadiness on feet  Muscle weakness (generalized)     Problem List Patient Active Problem List   Diagnosis Date Noted  . Strain of right biceps 09/12/2017  . Lymphedema 02/10/2017  . BPH associated with nocturia 05/16/2016  . Senile purpura ([place]) 05/03/2016  . OSA (obstructive sleep apnea) 12/28/2015  . Hypersomnia 11/15/2015  . Cough 11/15/2015  . 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 Dizziness 12/30/2014  . Diastolic dysfunction 123456  . Former smoker 04/29/2014  . Chronic venous insufficiency 02/20/2014  . Hyperglycemia 08/02/2012  . Morbid obesity ([place]) 07/13/2010   . History of malignant melanoma. left leg 03/09/2009  . Insomnia 10/03/2007  . Hyperlipidemia 12/13/2006  . Essential hypertension 12/13/2006    Ander Purpura, PT 09/29/2019, 5:19 PM  [place], [place], [address] Phone: [phone]   Fax:  [phone]  Name: Nicholas [name] MRN: [id number] Date of Birth: July 18, 1946

## 2019-10-02 ENCOUNTER — Telehealth: Payer: Self-pay | Admitting: Family Medicine

## 2019-10-02 NOTE — Telephone Encounter (Signed)
Called patient decreased hearing x 4 weeks no covid symptoms app made for tomorrow. I had opening today but not able to make it here. No pain, drainage or discomfort. Gradual over time.

## 2019-10-02 NOTE — Telephone Encounter (Signed)
Spouse called.  States that patient can not hear out of his left ear.  Would like to know if this could be related to Parkinson's.  Would like to know if Dr. Yong Channel can work them in to be seen sooner than the next available.

## 2019-10-03 ENCOUNTER — Encounter: Payer: Self-pay | Admitting: Family Medicine

## 2019-10-03 ENCOUNTER — Other Ambulatory Visit: Payer: Self-pay

## 2019-10-03 ENCOUNTER — Ambulatory Visit (INDEPENDENT_AMBULATORY_CARE_PROVIDER_SITE_OTHER): Payer: PPO | Admitting: Family Medicine

## 2019-10-03 VITALS — BP 130/84 | HR 80 | Temp 98.2°F | Ht 67.0 in | Wt 235.0 lb

## 2019-10-03 DIAGNOSIS — H93A9 Pulsatile tinnitus, unspecified ear: Secondary | ICD-10-CM | POA: Diagnosis not present

## 2019-10-03 DIAGNOSIS — E785 Hyperlipidemia, unspecified: Secondary | ICD-10-CM | POA: Diagnosis not present

## 2019-10-03 DIAGNOSIS — I10 Essential (primary) hypertension: Secondary | ICD-10-CM

## 2019-10-03 DIAGNOSIS — H6122 Impacted cerumen, left ear: Secondary | ICD-10-CM

## 2019-10-03 DIAGNOSIS — R739 Hyperglycemia, unspecified: Secondary | ICD-10-CM | POA: Diagnosis not present

## 2019-10-03 NOTE — Patient Instructions (Addendum)
Health Maintenance Due  Topic Date Due  . COLONOSCOPY-Cologuard? 04/10/2019   We did an urgent referral to ENT-hopefully we can have you seen early next week-if you have new or worsening symptoms let us know   Please stop by lab before you go If you do not have mychart- we will call you about results within 5 business days of [place] receiving them.  If you have mychart- we will send your results within 3 business days of [place] receiving them.  If abnormal or we want to clarify a result, we will call or mychart you to make sure you receive the message.  If you have questions or concerns or don't hear within 5 business days, please send [place] a message or call us.    Recommended follow up: Updating for blood work today but would recommend scheduling physical perhaps in 6 months to check I can

## 2019-10-03 NOTE — Progress Notes (Signed)
Phone [phone] In person visit   Subjective:   Nicholas [name] is a 74 y.o. year old very pleasant male patient who presents for/with See problem oriented charting Chief Complaint  Patient presents with  . Follow-up  . decreased hearing   This visit occurred during the SARS-CoV-2 public health emergency.  Safety protocols were in place, including screening questions prior to the visit, additional usage of staff PPE, and extensive cleaning of exam room while observing appropriate contact time as indicated for disinfecting solutions.   Past Medical History-  Patient Active Problem List   Diagnosis Date Noted  . Lymphedema 02/10/2017    Priority: High  . 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 History of malignant melanoma. left leg 03/09/2009    Priority: High  . BPH associated with nocturia 05/16/2016    Priority: Medium  . OSA (obstructive sleep apnea) 12/28/2015    Priority: Medium  . Diastolic dysfunction 123456    Priority: Medium  . Chronic venous insufficiency 02/20/2014    Priority: Medium  . Hyperglycemia 08/02/2012    Priority: Medium  . Hyperlipidemia 12/13/2006    Priority: Medium  . Essential hypertension 12/13/2006    Priority: Medium  . Senile purpura ([place]) 05/03/2016    Priority: Low  . Hypersomnia 11/15/2015    Priority: Low  . Former smoker 04/29/2014    Priority: Low  . Morbid obesity ([place]) 07/13/2010    Priority: Low  . Insomnia 10/03/2007    Priority: Low  . Strain of right biceps 09/12/2017  . Cough 11/15/2015  . Dizziness 12/30/2014    Medications- reviewed and updated Current Outpatient Medications  Medication Sig Dispense Refill  . carbidopa-levodopa (SINEMET IR) 25-100 MG tablet TAKE 2 TABLETS AT 7AM,11AM, 3PM, AND AT 7PM. 720 tablet 0  . diphenhydrAMINE (BENADRYL) 25 mg capsule Take 25 mg by mouth at bedtime.    . fesoterodine (TOVIAZ) 8 MG TB24 tablet Take 8 mg by mouth daily.    Marland Kitchen ibuprofen  (ADVIL,MOTRIN) 200 MG tablet Take 400 mg by mouth every 6 (six) hours as needed for moderate pain.    . rosuvastatin (CRESTOR) 10 MG tablet Take 1 tablet (10 mg total) by mouth daily. 90 tablet 3  . triamcinolone cream (KENALOG) 0.1 %      No current facility-administered medications for this visit.     Objective:  BP 130/84   Pulse 80   Temp 98.2 F (36.8 C)   Ht 5\' 7"  (1.702 m)   Wt 235 lb (106.6 kg)   SpO2 96%   BMI 36.81 kg/m  Gen: NAD, resting comfortably CV: RRR no murmurs rubs or gallops Lungs: CTAB no crackles, wheeze, rhonchi Ext: Severe edema Skin: warm, dry Neuro: Wheelchair-bound    Assessment and Plan  Decreased Hearing S: about 3 weeks ago felt like had water in ear- put q tip in - after that started  With left ear sounding like he is in a hollow tube and ringing and he think his tinnitus is acting up. Hearing a pulsating sound in left ear.  A/P: 74 year old male with decreased hearing and pulsatile quality to his tinnitus-he does have a significant cerumen impaction that we were unable to irrigate today adequately.  We will refer him to ENT for removal of cerumen and evaluation of pulsatile tinnitus-though I am hopeful symptoms will resolve with removal of cerumen  #hyperlipidemia S: compliant with rosuvastatin 10 mg Lab Results  Component Value Date   CHOL  88 06/19/2018   HDL 32.80 (L) 06/19/2018   LDLCALC 31 06/19/2018   LDLDIRECT 50.0 05/03/2016   TRIG 121.0 06/19/2018   CHOLHDL 3 06/19/2018   A/P: Previously very well controlled-update lipid panel today  #hypertension S: compliant with no medication.  Movement limited by Parkinson's.  Diet not the best BP Readings from Last 3 Encounters:  10/03/19 130/84  07/28/19 132/82  04/14/19 126/78  A/P: Well-controlled without medication-update labs      # Hyperglycemia/insulin resistance/prediabetes S: Exercise and diet-exercise limited by Parkinson's.  Diet is not the best but fortunately no recent  weight gain Lab Results  Component Value Date   HGBA1C 5.6 02/09/2017   HGBA1C 5.7 05/03/2016   HGBA1C 5.8 08/25/2013   A/P: Prior A1c was elevated in 2017-we will update hemoglobin A1c at that time-hopefully remains in normal or lower end of prediabetes range  #Right lateral hip pain-somewhat difficult with exam but appears to be over right greater trochanter.  Suspect trochanteric bursitis-recommended trial of Aleve twice a day for 7 days-if not improving would refer to sports medicine   Recommended follow up: Updating for blood work today but would recommend scheduling physical perhaps in 6 months to check I can Future Appointments  Date Time Provider [place]  10/06/2019  3:00 PM Ander Purpura, PT [facility] None  10/08/2019  3:00 PM Ander Purpura, PT [facility] None  10/20/2019  3:00 PM Ander Purpura, PT [facility] None  10/22/2019  4:00 PM Ander Purpura, PT [facility] None  10/27/2019  2:00 PM Ander Purpura, PT [facility] None  10/29/2019  3:00 PM Ander Purpura, PT [facility] None  11/03/2019  3:00 PM Ander Purpura, PT [facility] None  11/05/2019  3:00 PM Ander Purpura, PT [facility] None  11/10/2019  3:00 PM Ander Purpura, PT [facility] None  11/12/2019  3:00 PM Ander Purpura, PT [facility] None  11/26/2019  1:30 PM [name], [name], DO [facility] None    Lab/Order associations:   ICD-10-CM   1. Impacted cerumen of left ear  H61.22 Ambulatory referral to ENT  2. Pulsatile tinnitus  H93.A9 Ambulatory referral to ENT  3. Hyperlipidemia, unspecified hyperlipidemia type  E78.5 CBC with Differential/Platelet    Comprehensive metabolic panel    Lipid panel  4. Essential hypertension  I10   5. Hyperglycemia  R73.9 Hemoglobin A1c    Return precautions advised.  [name], MD

## 2019-10-04 LAB — CBC WITH DIFFERENTIAL/PLATELET
Absolute Monocytes: 612 cells/uL (ref 200–950)
Basophils Absolute: 20 cells/uL (ref 0–200)
Basophils Relative: 0.3 %
Eosinophils Absolute: 292 cells/uL (ref 15–500)
Eosinophils Relative: 4.3 %
HCT: 44.3 % (ref 38.5–50.0)
Hemoglobin: 14.4 g/dL (ref 13.2–17.1)
Lymphs Abs: 1578 cells/uL (ref 850–3900)
MCH: 30 pg (ref 27.0–33.0)
MCHC: 32.5 g/dL (ref 32.0–36.0)
MCV: 92.3 fL (ref 80.0–100.0)
MPV: 10.4 fL (ref 7.5–12.5)
Monocytes Relative: 9 %
Neutro Abs: 4298 cells/uL (ref 1500–7800)
Neutrophils Relative %: 63.2 %
Platelets: 162 10*3/uL (ref 140–400)
RBC: 4.8 10*6/uL (ref 4.20–5.80)
RDW: 12.8 % (ref 11.0–15.0)
Total Lymphocyte: 23.2 %
WBC: 6.8 10*3/uL (ref 3.8–10.8)

## 2019-10-04 LAB — COMPREHENSIVE METABOLIC PANEL
AG Ratio: 2.2 (calc) (ref 1.0–2.5)
ALT: 5 U/L — ABNORMAL LOW (ref 9–46)
AST: 11 U/L (ref 10–35)
Albumin: 4.2 g/dL (ref 3.6–5.1)
Alkaline phosphatase (APISO): 60 U/L (ref 35–144)
BUN/Creatinine Ratio: 28 (calc) — ABNORMAL HIGH (ref 6–22)
BUN: 28 mg/dL — ABNORMAL HIGH (ref 7–25)
CO2: 26 mmol/L (ref 20–32)
Calcium: 9.3 mg/dL (ref 8.6–10.3)
Chloride: 105 mmol/L (ref 98–110)
Creat: 1.01 mg/dL (ref 0.70–1.18)
Globulin: 1.9 g/dL (calc) (ref 1.9–3.7)
Glucose, Bld: 87 mg/dL (ref 65–99)
Potassium: 4.7 mmol/L (ref 3.5–5.3)
Sodium: 140 mmol/L (ref 135–146)
Total Bilirubin: 0.5 mg/dL (ref 0.2–1.2)
Total Protein: 6.1 g/dL (ref 6.1–8.1)

## 2019-10-04 LAB — LIPID PANEL
Cholesterol: 105 mg/dL (ref <200)
HDL: 38 mg/dL — ABNORMAL LOW (ref >=40)
LDL Cholesterol (Calc): 45 mg/dL (calc)
Non-HDL Cholesterol (Calc): 67 mg/dL (calc) (ref <130)
Total CHOL/HDL Ratio: 2.8 (calc) (ref <5.0)
Triglycerides: 135 mg/dL (ref <150)

## 2019-10-04 LAB — HEMOGLOBIN A1C
Hgb A1c MFr Bld: 5.8 % of total Hgb — ABNORMAL HIGH (ref <5.7)
Mean Plasma Glucose: 120 (calc)
eAG (mmol/L): 6.6 (calc)

## 2019-10-06 ENCOUNTER — Ambulatory Visit: Payer: PPO

## 2019-10-06 ENCOUNTER — Other Ambulatory Visit: Payer: Self-pay

## 2019-10-06 DIAGNOSIS — R2681 Unsteadiness on feet: Secondary | ICD-10-CM

## 2019-10-06 DIAGNOSIS — R2689 Other abnormalities of gait and mobility: Secondary | ICD-10-CM

## 2019-10-06 DIAGNOSIS — I890 Lymphedema, not elsewhere classified: Secondary | ICD-10-CM

## 2019-10-06 DIAGNOSIS — M6281 Muscle weakness (generalized): Secondary | ICD-10-CM

## 2019-10-06 NOTE — Therapy (Signed)
[place], [place], [address] Phone: [phone]   Fax:  [phone]  Physical Therapy Treatment  Patient Details  Name: Nicholas [name] MRN: [id number] Date of Birth: 1946-04-11 Referring Provider (PT): Dr. [name]   Encounter Date: 10/06/2019  PT End of Session - 10/06/19 1512    Visit Number  31    Number of Visits  41    Date for PT Re-Evaluation  11/10/19    Authorization Type  [employer] (Medicare)-will need 10th visit progress note    PT Start Time  1507    PT Stop Time  1545    PT Time Calculation (min)  38 min    Activity Tolerance  Patient tolerated treatment well    Behavior During Therapy  [facility] for tasks assessed/performed       Past Medical History:  Diagnosis Date  . Cancer [facility]) Jan 2008   skin; hx of melanoma left foot/ amputation of toes 1&2   . Hyperlipidemia   . Hypertension   . Lymphedema     Past Surgical History:  Procedure Laterality Date  . 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 removal of melanoma of left foot/amputation of toes 1&25 Jul 2006  . WRIST FRACTURE SURGERY     74 years old-set    There were no vitals filed for this visit.  Subjective Assessment - 10/06/19 1512    Subjective  pt states that his spouse is unable to don the shorts. He states he wants to keep them and will try to find someone who can help don them.    Pertinent History  Lymphedema-has compression machine and uses stockings (wife helps); hx of PD/Parkinson's Plus Syndrone; malignant melanoma, hyperlipidemia, HTN, insomnia, obesity, hyperglycemia, dizziness, OSA, urinary urgency    Patient Stated Goals  get back in stockings and to put his his shoes on    Currently in Pain?  No/denies    Multiple Pain Sites  No                  [facility]  Lymphedema Life Impact Scale Total Score  14.71 %            [facility] Adult PT Treatment/Exercise - 10/06/19 0001      Manual Therapy   Manual Therapy  Edema management    Edema Management  Farrow wrap with stocking was placed on BLE following MLD and edema massage with legs elevated.    Manual Lymphatic Drainage (MLD)  In supine: short neck, Bil axillary and inguinal lymph nodes, established BIl inguino-axillary anastomosis, medial to lateral L thigh then lateral L thigh to the knee, all surfaces of the lower leg and dorsum of the foot; then RLE medial to lateral thigh, lateral thigh, bottle neck at knee, posterior knee, all surfaces of the lower leg and dorsum of the foot; reworked all surfaces then deep abdominals. Continuing with effleurage/deeper effleurage and extra time on the Bil lower legs.              PT Education - 10/06/19 1557    Education Details  Pt and spouse will don his bicycle shorts that his spouse is able to don. He will wear compression garments and continue with what he is doing at home at this time.    Person(s) Educated  Patient    Methods  Explanation    Comprehension  Verbalized understanding  PT Short Term Goals - 08/04/19 1604      PT SHORT TERM GOAL #1   Title  --    Baseline  --        PT Long Term Goals - 09/29/19 1717      PT LONG TERM GOAL #1   Title  Pt will decrease lymphedema volume in bilateral LEs to allow pt and wife to donn stockings at home for continued self care    Baseline  Continue with difficulty with night garments    Time  5    Period  Weeks    Status  On-going    Target Date  11/10/19            Plan - 10/06/19 1511    Clinical Impression Statement  Pt presents to physical therapy with continued edema in his Bil lower legs. MLD was performed for BLE and anterior trunk with continued softening following MLD. Pt decreases in fluid wtih MLD and light compression. Currently a viable option for home management has not been found due to pt has significnat mobility  impairment secondary to Parkinson's diagnosis and spouse is unableto assist. Pt will benefit from continud POC at this time in order to find a viable option for home management in order to decrease risk for wounds/increased edema and infection.    Personal Factors and Comorbidities  Age;Fitness;Comorbidity 2    Comorbidities  limited mobility, lymph node removal    Rehab Potential  Fair    Clinical Impairments Affecting Rehab Potential  history of parkinsons disease with limitations in ablilty to put on compression garments, see if pt likes Farrow wrap on LLE and if he wants to order for the R.    PT Frequency  1x / week    PT Duration  6 weeks    PT Treatment/Interventions  ADLs/Self Care Home Management;Therapeutic activities;Therapeutic exercise;Patient/family education;Orthotic Fit/Training;Manual techniques;Vasopneumatic Device;Compression bandaging;Manual lymph drainage;Functional mobility training;DME Instruction    PT Next Visit Plan  continue CDT of bil LEs until ind with velcro garments and pump, ask about shorts or edema wear.    Consulted and Agree with Plan of Care  Patient    Family Member Consulted  wife       Patient will benefit from skilled therapeutic intervention in order to improve the following deficits and impairments:  Decreased skin integrity, Postural dysfunction, Obesity, Increased edema, Difficulty walking, Decreased mobility  Visit Diagnosis: Lymphedema, not elsewhere classified  Other abnormalities of gait and mobility  Unsteadiness on feet  Muscle weakness (generalized)     Problem List Patient Active Problem List   Diagnosis Date Noted  . Strain of right biceps 09/12/2017  . Lymphedema 02/10/2017  . BPH associated with nocturia 05/16/2016  . Senile purpura ([place]) 05/03/2016  . OSA (obstructive sleep apnea) 12/28/2015  . Hypersomnia 11/15/2015  . Cough 11/15/2015  . 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 Dizziness 12/30/2014  . Diastolic  dysfunction 123456  . Former smoker 04/29/2014  . Chronic venous insufficiency 02/20/2014  . Hyperglycemia 08/02/2012  . Morbid obesity ([place]) 07/13/2010  . History of malignant melanoma. left leg 03/09/2009  . Insomnia 10/03/2007  . Hyperlipidemia 12/13/2006  . Essential hypertension 12/13/2006    Ander Purpura, PT 10/06/2019, 4:00 PM  [place], [place], [address] Phone: [phone]   Fax:  [phone]  Name: MAAN RYMER MRN: [id number] Date of Birth: 1946-02-17

## 2019-10-08 ENCOUNTER — Ambulatory Visit: Payer: PPO

## 2019-10-08 ENCOUNTER — Other Ambulatory Visit: Payer: Self-pay

## 2019-10-08 DIAGNOSIS — R2689 Other abnormalities of gait and mobility: Secondary | ICD-10-CM

## 2019-10-08 DIAGNOSIS — R2681 Unsteadiness on feet: Secondary | ICD-10-CM

## 2019-10-08 DIAGNOSIS — I890 Lymphedema, not elsewhere classified: Secondary | ICD-10-CM

## 2019-10-08 DIAGNOSIS — M6281 Muscle weakness (generalized): Secondary | ICD-10-CM

## 2019-10-08 NOTE — Therapy (Signed)
[place], [place], [address] Phone: [phone]   Fax:  [phone]  Physical Therapy Treatment  Patient Details  Name: Nicholas [name] MRN: [id number] Date of Birth: Sep 11, 1945 Referring Provider (PT): Dr. [name]   Encounter Date: 10/08/2019  PT End of Session - 10/08/19 1602    Visit Number  32    Number of Visits  41    Date for PT Re-Evaluation  11/10/19    Authorization Type  [employer] (Medicare)-will need 10th visit progress note    PT Start Time  1515    PT Stop Time  1545    PT Time Calculation (min)  30 min    Activity Tolerance  Patient tolerated treatment well    Behavior During Therapy  [facility] for tasks assessed/performed       Past Medical History:  Diagnosis Date  . Cancer [facility]) Jan 2008   skin; hx of melanoma left foot/ amputation of toes 1&2   . Hyperlipidemia   . Hypertension   . Lymphedema     Past Surgical History:  Procedure Laterality Date  . 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 removal of melanoma of left foot/amputation of toes 1&25 Jul 2006  . WRIST FRACTURE SURGERY     74 years old-set    There were no vitals filed for this visit.  Subjective Assessment - 10/08/19 1559    Subjective  Pt states that he has been pumping. He reports that his wife has been donning his garments but using the old socks.    Pertinent History  Lymphedema-has compression machine and uses stockings (wife helps); hx of PD/Parkinson's Plus Syndrone; malignant melanoma, hyperlipidemia, HTN, insomnia, obesity, hyperglycemia, dizziness, OSA, urinary urgency                  Outpatient Rehab from 07/02/2018 in [place]  Lymphedema Life Impact Scale Total Score  14.71 %           [facility] Adult PT Treatment/Exercise - 10/08/19 0001      Manual Therapy   Manual Therapy  Edema management;Manual Lymphatic Drainage (MLD)    Edema  Management  Farrow wrap with stocking was placed on BLE following MLD and edema massage with legs elevated.    Manual Lymphatic Drainage (MLD)  Shorter session today due to pt was late and had to go to the bathroom. In supine: short neck, Bil axillary and inguinal lymph nodes, established BIl inguino-axillary anastomosis, medial to lateral L thigh then lateral L thigh to the knee, all surfaces of the lower leg and dorsum of the foot; then RLE medial to lateral thigh, lateral thigh, bottle neck at knee, posterior knee, all surfaces of the lower leg and dorsum of the foot; reworked all surfaces then deep abdominals. Continuing with effleurage/deeper effleurage and extra time on the Bil lower legs.              PT Education - 10/08/19 1600    Education Details  Discussed the importance of having consistent help at home with pt due to his wife consistently dons his garments inadequatley after multiple sessions of education. She uses socks that have a hole in them .Pt states he does not always have help at home and it takes him 20 minutes to get in and out of his pump.    Person(s) Educated  Patient    Methods  Explanation    Comprehension  Verbalized understanding       PT Short Term Goals - 08/04/19 1604      PT SHORT TERM GOAL #1   Title  --    Baseline  --        PT Long Term Goals - 09/29/19 1717      PT LONG TERM GOAL #1   Title  Pt will decrease lymphedema volume in bilateral LEs to allow pt and wife to donn stockings at home for continued self care    Baseline  Continue with difficulty with night garments    Time  5    Period  Weeks    Status  On-going    Target Date  11/10/19            Plan - 10/08/19 1602    Clinical Impression Statement  Pt presents to physical therapy wearing his compression garment with straps hanging off and strapped in the wrong place. He has a hole in the sock on the R foot. MLD was performed for BLE and anterior trunk with continued  softening following MLD. Pt needs home health help at home in order to be discharged from physical therapy or he risks skin openings resulting in wounds/infection. Discussed this with patient in depth at this session that he needs consistent help at the house since he is unable to depend on the assistance of his spouse. Pt will benefit from continued POC at this time in order to find a viable home option and to ensure that he has the appropriate garments and equipment at home for edema management.    Personal Factors and Comorbidities  Age;Fitness;Comorbidity 2    Comorbidities  limited mobility, lymph node removal    Rehab Potential  Fair    Clinical Impairments Affecting Rehab Potential  history of parkinsons disease with limitations in ablilty to put on compression garments, see if pt likes Farrow wrap on LLE and if he wants to order for the R.    PT Frequency  2x / week    PT Duration  --   5 weeks   PT Treatment/Interventions  ADLs/Self Care Home Management;Therapeutic activities;Therapeutic exercise;Patient/family education;Orthotic Fit/Training;Manual techniques;Vasopneumatic Device;Compression bandaging;Manual lymph drainage;Functional mobility training;DME Instruction    PT Next Visit Plan  continue CDT of bil LEs until ind with velcro garments and pump, ask about shorts or edema wear.    Consulted and Agree with Plan of Care  Patient    Family Member Consulted  wife       Patient will benefit from skilled therapeutic intervention in order to improve the following deficits and impairments:  Decreased skin integrity, Postural dysfunction, Obesity, Increased edema, Difficulty walking, Decreased mobility  Visit Diagnosis: Lymphedema, not elsewhere classified  Other abnormalities of gait and mobility  Unsteadiness on feet  Muscle weakness (generalized)     Problem List Patient Active Problem List   Diagnosis Date Noted  . Strain of right biceps 09/12/2017  . Lymphedema  02/10/2017  . BPH associated with nocturia 05/16/2016  . Senile purpura ([place]) 05/03/2016  . OSA (obstructive sleep apnea) 12/28/2015  . Hypersomnia 11/15/2015  . Cough 11/15/2015  . 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 Dizziness 12/30/2014  . Diastolic dysfunction 123456  . Former smoker 04/29/2014  . Chronic venous insufficiency 02/20/2014  . Hyperglycemia 08/02/2012  . Morbid obesity ([place]) 07/13/2010  . History of malignant melanoma. left leg 03/09/2009  . Insomnia 10/03/2007  . Hyperlipidemia 12/13/2006  . Essential hypertension 12/13/2006  Ander Purpura, PT 10/08/2019, 4:06 PM  [place], [place], [address] Phone: [phone]   Fax:  [phone]  Name: Nicholas [name] MRN: [id number] Date of Birth: 1945/09/15

## 2019-10-20 ENCOUNTER — Ambulatory Visit: Payer: PPO

## 2019-10-20 ENCOUNTER — Telehealth: Payer: Self-pay

## 2019-10-20 NOTE — Telephone Encounter (Signed)
Patient's wife called in stating that she and her husband have come to an agreement that they want to proceed with home health. Said that Dr. Yong Channel will know what that means and that she wishes to have him return her phone call. Informed patient that there is already a referral sent to [facility], did she want their number to schedule the initial home visit. She declined stating that she has to talk to Dr. Yong Channel.

## 2019-10-20 NOTE — Telephone Encounter (Signed)
Please try to gather more info- do they want a home health referral for physical therapy to help him with activity at home?

## 2019-10-21 ENCOUNTER — Ambulatory Visit: Payer: PPO

## 2019-10-21 ENCOUNTER — Other Ambulatory Visit: Payer: Self-pay

## 2019-10-21 DIAGNOSIS — I890 Lymphedema, not elsewhere classified: Secondary | ICD-10-CM

## 2019-10-21 DIAGNOSIS — R2689 Other abnormalities of gait and mobility: Secondary | ICD-10-CM

## 2019-10-21 DIAGNOSIS — R2681 Unsteadiness on feet: Secondary | ICD-10-CM

## 2019-10-21 DIAGNOSIS — M6281 Muscle weakness (generalized): Secondary | ICD-10-CM

## 2019-10-21 NOTE — Telephone Encounter (Signed)
Called and spoke to patient. States that he needs help getting compression stockings on. States that about 4 weeks ago someone named [facility] came in and told them that they could not help with this. States that the only thing he needs help with is the compression stockings wife is not able to get on and lymphedema is not improving. Looks like the last referral to [facility] was done around 08/07/19.

## 2019-10-21 NOTE — Telephone Encounter (Signed)
Would be reasonable to call [facility] directly and see why they are not able to help with this. THis would really help improve patients health- even if home health aid could help. I would think OT as this is a daily necessity would be appropriate or even a home health aide

## 2019-10-21 NOTE — Therapy (Signed)
[place], [place], [address] Phone: [phone]   Fax:  [phone]  Physical Therapy Treatment  Patient Details  Name: Nicholas [name] MRN: [id number] Date of Birth: 02/14/1946 Referring Provider (PT): Dr. [name]   Encounter Date: 10/21/2019  PT End of Session - 10/21/19 1453    Visit Number  33    Number of Visits  41    Date for PT Re-Evaluation  11/10/19    Authorization Type  [employer] (Medicare)-will need 10th visit progress note    PT Start Time  1455    PT Stop Time  1555    PT Time Calculation (min)  60 min    Activity Tolerance  Patient tolerated treatment well    Behavior During Therapy  [facility] for tasks assessed/performed       Past Medical History:  Diagnosis Date  . 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 Hyperlipidemia   . Hypertension   . Lymphedema     Past Surgical History:  Procedure Laterality Date  . 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 removal of melanoma of left foot/amputation of toes 1&25 Jul 2006  . WRIST FRACTURE SURGERY     74 years old-set    There were no vitals filed for this visit.  Subjective Assessment - 10/21/19 1453    Subjective  Pt states that he went to a mediation meeting that was 17.5 hours long and has had terrible swelling since that time.    Pertinent History  Lymphedema-has compression machine and uses stockings (wife helps); hx of PD/Parkinson's Plus Syndrone; malignant melanoma, hyperlipidemia, HTN, insomnia, obesity, hyperglycemia, dizziness, OSA, urinary urgency    Patient Stated Goals  get back in stockings and to put his his shoes on    Currently in Pain?  No/denies    Multiple Pain Sites  No                  [facility]  Lymphedema Life Impact Scale Total Score  14.71 %           [facility] Adult PT  Treatment/Exercise - 10/21/19 0001      Manual Therapy   Manual Therapy  Manual Lymphatic Drainage (MLD);Compression Bandaging    Manual Lymphatic Drainage (MLD)   In supine: short neck, Bil axillary and inguinal lymph nodes, established BIl inguino-axillary anastomosis, medial to lateral L thigh then lateral L thigh to the knee, all surfaces of the lower leg and dorsum of the foot; then RLE medial to lateral thigh, lateral thigh, bottle neck at knee, posterior knee, all surfaces of the lower leg and dorsum of the foot; reworked all surfaces then deep abdominals. Continuing with effleurage/deeper effleurage and extra time on the Bil lower legs.     Compression Bandaging  TG soft from foot to thigh placed Bil, In sitting 3 artiflex and 4 short stretch bandages from foot to groin Bil with foot to knee in sitting then knee to groin in standing.              PT Education - 10/21/19 1733    Education Details  Pt and spouse have called [facility] about home help and are going to follow up tomorrow. They are aware that they need assistance at home in order to manage edema and that they need this to discharge  from physical therapy in order to decrease risk for open wounds, infection and immobility. Pt will continue using the pump that he has at home at this time until he gets the appropriate pump.    Person(s) Educated  Patient    Methods  Explanation    Comprehension  Verbalized understanding       PT Short Term Goals - 08/04/19 1604      PT SHORT TERM GOAL #1   Title  --    Baseline  --        PT Long Term Goals - 09/29/19 1717      PT LONG TERM GOAL #1   Title  Pt will decrease lymphedema volume in bilateral LEs to allow pt and wife to donn stockings at home for continued self care    Baseline  Continue with difficulty with night garments    Time  5    Period  Weeks    Status  On-going    Target Date  11/10/19            Plan - 10/21/19 1453    Clinical Impression Statement  Pt  presents to physical therapy without any compression on his bil lower legs. They are visibly much larger than previously from the foot to the groin. Made the decision to wrap Bil LE from foot to groin due to pt mobility is much worse due to significant increase in fluid after he was in a meeting for 17 hours last week and was unable to come to physical therapy due to a hearing. Discussed significant need for home health with pt and spouse today including this can help improve quality of life for patient to free up time from ADL's to participate in activities that are meaningful due to pt moves slowly and takes hours to perform activities. Bil LE were wrapped from foot to groin following MLD in supine for the BLE. Pt will benefit from continued POC at this time.    Personal Factors and Comorbidities  Age;Fitness;Comorbidity 2    Comorbidities  limited mobility, lymph node removal    Rehab Potential  Fair    Clinical Impairments Affecting Rehab Potential  history of parkinsons disease with limitations in ablilty to put on compression garments, see if pt likes Farrow wrap on LLE and if he wants to order for the R.    PT Frequency  2x / week    PT Treatment/Interventions  ADLs/Self Care Home Management;Therapeutic activities;Therapeutic exercise;Patient/family education;Orthotic Fit/Training;Manual techniques;Vasopneumatic Device;Compression bandaging;Manual lymph drainage;Functional mobility training;DME Instruction    PT Next Visit Plan  continue CDT of bil LEs until ind with velcro garments and pump, ask about shorts or edema wear.    Consulted and Agree with Plan of Care  Patient    Family Member Consulted  wife       Patient will benefit from skilled therapeutic intervention in order to improve the following deficits and impairments:  Decreased skin integrity, Postural dysfunction, Obesity, Increased edema, Difficulty walking, Decreased mobility  Visit Diagnosis: Lymphedema, not elsewhere  classified  Other abnormalities of gait and mobility  Unsteadiness on feet  Muscle weakness (generalized)     Problem List Patient Active Problem List   Diagnosis Date Noted  . Strain of right biceps 09/12/2017  . Lymphedema 02/10/2017  . BPH associated with nocturia 05/16/2016  . Senile purpura ([place]) 05/03/2016  . OSA (obstructive sleep apnea) 12/28/2015  . Hypersomnia 11/15/2015  . Cough 11/15/2015  . 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 Dizziness 12/30/2014  . Diastolic dysfunction 123456  . Former smoker 04/29/2014  . Chronic venous insufficiency 02/20/2014  . Hyperglycemia 08/02/2012  . Morbid obesity ([place]) 07/13/2010  . History of malignant melanoma. left leg 03/09/2009  . Insomnia 10/03/2007  . Hyperlipidemia 12/13/2006  . Essential hypertension 12/13/2006    Ander Purpura, PT 10/21/2019, 5:37 PM  [place], [place], [address] Phone: [phone]   Fax:  [phone]  Name: Nicholas [name] MRN: [id number] Date of Birth: 1946/01/07

## 2019-10-22 ENCOUNTER — Ambulatory Visit: Payer: PPO

## 2019-10-22 DIAGNOSIS — R2689 Other abnormalities of gait and mobility: Secondary | ICD-10-CM

## 2019-10-22 DIAGNOSIS — I890 Lymphedema, not elsewhere classified: Secondary | ICD-10-CM

## 2019-10-22 DIAGNOSIS — R2681 Unsteadiness on feet: Secondary | ICD-10-CM

## 2019-10-22 DIAGNOSIS — M6281 Muscle weakness (generalized): Secondary | ICD-10-CM

## 2019-10-22 NOTE — Telephone Encounter (Signed)
I'll reach out to some of my contacts and report back.

## 2019-10-22 NOTE — Telephone Encounter (Signed)
Great research [name]-lets see who we can get him in with with home health.  Okay to repeat referral if needed

## 2019-10-22 NOTE — Therapy (Signed)
[place], [place], [address] Phone: [phone]   Fax:  [phone]  Physical Therapy Treatment  Patient Details  Name: Nicholas [name] MRN: [id number] Date of Birth: 23-Dec-1945 Referring Provider (PT): Dr. [name]   Encounter Date: 10/22/2019  PT End of Session - 10/22/19 1734    Visit Number  34    Number of Visits  41    Date for PT Re-Evaluation  11/10/19    Authorization Type  [employer] (Medicare)-will need 10th visit progress note    PT Start Time  1615    PT Stop Time  1720    PT Time Calculation (min)  65 min    Activity Tolerance  Patient tolerated treatment well    Behavior During Therapy  [facility] for tasks assessed/performed       Past Medical History:  Diagnosis Date  . 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 Hyperlipidemia   . Hypertension   . Lymphedema     Past Surgical History:  Procedure Laterality Date  . 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 removal of melanoma of left foot/amputation of toes 1&25 Jul 2006  . WRIST FRACTURE SURGERY     74 years old-set    There were no vitals filed for this visit.  Subjective Assessment - 10/22/19 1732    Subjective  Pt states that the wrap started to slide this morning and he tried to keep it on .    Pertinent History  Lymphedema-has compression machine and uses stockings (wife helps); hx of PD/Parkinson's Plus Syndrone; malignant melanoma, hyperlipidemia, HTN, insomnia, obesity, hyperglycemia, dizziness, OSA, urinary urgency    Patient Stated Goals  get back in stockings and to put his his shoes on    Currently in Pain?  No/denies                  Out[facility]  Lymphedema Life Impact Scale Total Score  14.71 %           [facility] Adult PT Treatment/Exercise - 10/22/19 0001      Manual Therapy   Manual Therapy  Manual Lymphatic Drainage (MLD);Compression Bandaging    Manual Lymphatic Drainage (MLD)   In supine: short neck, Bil axillary and inguinal lymph nodes, established BIl inguino-axillary anastomosis, medial to lateral L thigh then lateral L thigh to the knee, all surfaces of the lower leg and dorsum of the foot; then RLE medial to lateral thigh, lateral thigh, bottle neck at knee, posterior knee, all surfaces of the lower leg and dorsum of the foot; reworked all surfaces then deep abdominals. Continuing with effleurage/deeper effleurage and extra time on the Bil lower legs.     Compression Bandaging  soft liner from foot to thigh placed Bil, In sitting 3 artiflex and 4 short stretch bandages from foot to groin Bil with foot to knee in sitting then knee to groin in standing.              PT Education - 10/22/19 1733    Education Details  wear compression wrap from today until friday night/saturday morning then remove. wear velcro wraps on lower legs and TG soft or shorts on thighs. Continue to pump    Person(s) Educated  Patient    Methods  Explanation    Comprehension  Verbalized understanding  PT Short Term Goals - 08/04/19 1604      PT SHORT TERM GOAL #1   Title  --    Baseline  --        PT Long Term Goals - 09/29/19 1717      PT LONG TERM GOAL #1   Title  Pt will decrease lymphedema volume in bilateral LEs to allow pt and wife to donn stockings at home for continued self care    Baseline  Continue with difficulty with night garments    Time  5    Period  Weeks    Status  On-going    Target Date  11/10/19            Plan - 10/22/19 1734    Clinical Impression Statement  Pt presents with visual decrease in edema throughout his BLE with the wraps on. Wraps were removed and pt has significant improvement in definition at the knee and ankle. MLD wasperforming in supine and Pt was wraped with short stretch bandages from the foot to the groin again. Pt  educated to wear velcro wraps at the lower legs and TG soft at the thighs after removing wrapson friday or saturday. Pt will benefit from continued POC at this time.    Personal Factors and Comorbidities  Age;Fitness;Comorbidity 2    Comorbidities  limited mobility, lymph node removal    Rehab Potential  Fair    Clinical Impairments Affecting Rehab Potential  history of parkinsons disease with limitations in ablilty to put on compression garments, see if pt likes Farrow wrap on LLE and if he wants to order for the R.    PT Frequency  2x / week    PT Treatment/Interventions  ADLs/Self Care Home Management;Therapeutic activities;Therapeutic exercise;Patient/family education;Orthotic Fit/Training;Manual techniques;Vasopneumatic Device;Compression bandaging;Manual lymph drainage;Functional mobility training;DME Instruction    PT Next Visit Plan  continue CDT of bil LEs until ind with velcro garments and pump, ask about shorts or edema wear.    Consulted and Agree with Plan of Care  Patient    Family Member Consulted  wife       Patient will benefit from skilled therapeutic intervention in order to improve the following deficits and impairments:  Decreased skin integrity, Postural dysfunction, Obesity, Increased edema, Difficulty walking, Decreased mobility  Visit Diagnosis: Lymphedema, not elsewhere classified  Other abnormalities of gait and mobility  Unsteadiness on feet  Muscle weakness (generalized)     Problem List Patient Active Problem List   Diagnosis Date Noted  . Strain of right biceps 09/12/2017  . Lymphedema 02/10/2017  . BPH associated with nocturia 05/16/2016  . Senile purpura ([place]) 05/03/2016  . OSA (obstructive sleep apnea) 12/28/2015  . Hypersomnia 11/15/2015  . Cough 11/15/2015  . 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 Dizziness 12/30/2014  . Diastolic dysfunction 123456  . Former smoker 04/29/2014  . Chronic venous insufficiency 02/20/2014  .  Hyperglycemia 08/02/2012  . Morbid obesity ([place]) 07/13/2010  . History of malignant melanoma. left leg 03/09/2009  . Insomnia 10/03/2007  . Hyperlipidemia 12/13/2006  . Essential hypertension 12/13/2006    Ander Purpura, PT 10/22/2019, 5:36 PM  [place], [place], [address] Phone: [phone]   Fax:  [phone]  Name: Nicholas [name] MRN: [id number] Date of Birth: Mar 04, 1946

## 2019-10-22 NOTE — Telephone Encounter (Signed)
FYI: Called and spoke with [facility] and the lady informed me that they don't have anyone by the name of [facility] that works there and no one has been out to the patients home. The patient is not a client of theirs and [facility] is not accepting his insurance at the time so they aren't able to help him with any services at this time.

## 2019-10-23 ENCOUNTER — Other Ambulatory Visit: Payer: Self-pay

## 2019-10-23 ENCOUNTER — Encounter (INDEPENDENT_AMBULATORY_CARE_PROVIDER_SITE_OTHER): Payer: Self-pay | Admitting: Otolaryngology

## 2019-10-23 ENCOUNTER — Ambulatory Visit (INDEPENDENT_AMBULATORY_CARE_PROVIDER_SITE_OTHER): Payer: PPO | Admitting: Otolaryngology

## 2019-10-23 VITALS — Temp 97.2°F

## 2019-10-23 DIAGNOSIS — H6122 Impacted cerumen, left ear: Secondary | ICD-10-CM | POA: Diagnosis not present

## 2019-10-23 NOTE — Telephone Encounter (Signed)
[facility] said they will take the insurance ( I have connections ).  Can someone please place a referral for [facility] with directions on what is needed so they can review it.  I cant tell you that they will accept him - they have to review everything first and see what exactly his needs are.  I would just specify as much as you can in the comments.  Thanks!

## 2019-10-23 NOTE — Progress Notes (Signed)
HPI: Nicholas [name] is a 74 y.o. male who presents for evaluation of blockage of his left ear with wax.  He feels like he is in a tunnel.  He has severe lymphedema and is in a wheelchair..  Past Medical History:  Diagnosis Date  . 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 Hyperlipidemia   . Hypertension   . Lymphedema    Past Surgical History:  Procedure Laterality Date  . 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 removal of melanoma of left foot/amputation of toes 1&25 Jul 2006  . WRIST FRACTURE SURGERY     74 years old-set   Social History   Socioeconomic History  . Marital status: Married    Spouse name: Not on file  . Number of children: Not on file  . Years of education: Not on file  . Highest education level: Not on file  Occupational History  . Occupation: retired    Comment: Engineer, maintenance (IT)  Tobacco Use  . Smoking status: Former Smoker    Packs/day: 1.00    Years: 16.00    Pack years: 16.00    Types: Cigarettes    Quit date: 12/22/1993    Years since quitting: 25.8  . Smokeless tobacco: Never Used  Substance and Sexual Activity  . Alcohol use: No    Alcohol/week: 21.0 standard drinks    Types: 21 Standard drinks or equivalent per week    Comment: one time every few months now  . Drug use: No  . Sexual activity: Not on file  Other Topics Concern  . Not on file  Social History Narrative   Married 1981. Wife has kids-1 with 1 adopted grandchild.       Retired Tax adviser      Highest level of education:  B.S.      Hobbies: antiques, former Air cabin crew      Exercise: none currently.    Social Determinants of Health   Financial Resource Strain:   . Difficulty of Paying Living Expenses:   Food Insecurity:   . Worried About Charity fundraiser in the Last Year:   . Arboriculturist in the Last Year:   Transportation Needs:   . Film/video editor (Medical):   Marland Kitchen Lack of Transportation (Non-Medical):   Physical Activity:   .  Days of Exercise per Week:   . Minutes of Exercise per Session:   Stress:   . Feeling of Stress :   Social Connections:   . Frequency of Communication with Friends and Family:   . Frequency of Social Gatherings with Friends and Family:   . Attends Religious Services:   . Active Member of Clubs or Organizations:   . Attends Archivist Meetings:   Marland Kitchen Marital Status:    Family History  Problem Relation Age of Onset  . Hypertension Father   . CVA Father        age 18  . Hyperlipidemia Father   . Other Mother        Deceased  . Healthy Sister   . Healthy Brother    Allergies  Allergen Reactions  . Amoxicillin Swelling    Presumed angioedema of lips due to prednisone   Prior to Admission medications   Medication Sig Start Date End Date Taking? Authorizing Provider  carbidopa-levodopa (SINEMET IR) 25-100 MG tablet TAKE 2 TABLETS AT 7AM,11AM, 3PM, AND AT 7PM. 08/11/19  Yes  Ludwig Clarks, DO  diphenhydrAMINE (BENADRYL) 25 mg capsule Take 25 mg by mouth at bedtime.   Yes [provider]  fesoterodine (TOVIAZ) 8 MG TB24 tablet Take 8 mg by mouth daily.   Yes [provider]  ibuprofen (ADVIL,MOTRIN) 200 MG tablet Take 400 mg by mouth every 6 (six) hours as needed for moderate pain.   Yes [provider]  rosuvastatin (CRESTOR) 10 MG tablet Take 1 tablet (10 mg total) by mouth daily. 01/07/19  Yes [name], MD  triamcinolone cream (KENALOG) 0.1 %  07/03/19  Yes [provider]     Positive ROS: Otherwise negative  All other systems have been reviewed and were otherwise negative with the exception of those mentioned in the HPI and as above.  Physical Exam: Constitutional: Alert, well-appearing, no acute distress Ears: External ears without lesions or tenderness. Ear canals.  Right ear canal is clear left ear canal is completely occluded with cerumen that was cleaned with suction and hydroperoxide.  The TM was otherwise  clear. Nasal: External nose without lesions. Clear nasal passages Oral: Oropharynx clear. Neck: No palpable adenopathy or masses Respiratory: Breathing comfortably  Skin: No facial/neck lesions or rash noted.  Cerumen impaction removal  Date/Time: 10/23/2019 4:18 PM Performed by: Rozetta Nunnery, MD Authorized by: Rozetta Nunnery, MD   Consent:    Consent obtained:  Verbal   Consent given by:  Patient   Risks discussed:  Pain and bleeding Procedure details:    Location:  L ear   Procedure type: curette and suction   Post-procedure details:    Inspection:  TM intact and canal normal   Hearing quality:  Improved   Patient tolerance of procedure:  Tolerated well, no immediate complications Comments:     Both TMs were clear.  Left ear canal was occluded with cerumen.    Assessment: Left ear cerumen impaction  Plan: This was cleaned in the office.  He will follow-up as needed.  Radene Journey, MD

## 2019-10-23 NOTE — Telephone Encounter (Signed)
Please advise do you want sent for evaluation?

## 2019-10-25 NOTE — Telephone Encounter (Signed)
Yes thanks- the main thing we need is help getting his compression stockings on- may need long term home health aide- wife simply is not able to get these on consistently and he is chronically going to PT for help with this- transport is not super easy for him so would be much better if we could figure out how to do this at home long term

## 2019-10-27 ENCOUNTER — Ambulatory Visit: Payer: PPO | Attending: Family Medicine

## 2019-10-27 DIAGNOSIS — R2681 Unsteadiness on feet: Secondary | ICD-10-CM | POA: Insufficient documentation

## 2019-10-27 DIAGNOSIS — R2689 Other abnormalities of gait and mobility: Secondary | ICD-10-CM | POA: Insufficient documentation

## 2019-10-27 DIAGNOSIS — M6281 Muscle weakness (generalized): Secondary | ICD-10-CM | POA: Insufficient documentation

## 2019-10-27 DIAGNOSIS — I890 Lymphedema, not elsewhere classified: Secondary | ICD-10-CM | POA: Insufficient documentation

## 2019-10-29 ENCOUNTER — Other Ambulatory Visit: Payer: Self-pay

## 2019-10-29 ENCOUNTER — Ambulatory Visit: Payer: PPO

## 2019-10-29 DIAGNOSIS — M6281 Muscle weakness (generalized): Secondary | ICD-10-CM | POA: Diagnosis not present

## 2019-10-29 DIAGNOSIS — I890 Lymphedema, not elsewhere classified: Secondary | ICD-10-CM | POA: Diagnosis not present

## 2019-10-29 DIAGNOSIS — R2689 Other abnormalities of gait and mobility: Secondary | ICD-10-CM | POA: Diagnosis not present

## 2019-10-29 DIAGNOSIS — R2681 Unsteadiness on feet: Secondary | ICD-10-CM

## 2019-10-29 NOTE — Therapy (Signed)
[place], [place], [address] Phone: [phone]   Fax:  [phone]  Physical Therapy Treatment  Patient Details  Name: Nicholas [name] MRN: [id number] Date of Birth: May 29, 1946 Referring Provider (PT): Dr. [name]   Encounter Date: 10/29/2019  PT End of Session - 10/29/19 1513    Visit Number  35    Number of Visits  41    Date for PT Re-Evaluation  11/10/19    Authorization Type  [employer] (Medicare)-will need 10th visit progress note    PT Start Time  1505    PT Stop Time  1605    PT Time Calculation (min)  60 min    Activity Tolerance  Patient tolerated treatment well    Behavior During Therapy  [facility] for tasks assessed/performed       Past Medical History:  Diagnosis Date  . 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 Hyperlipidemia   . Hypertension   . Lymphedema     Past Surgical History:  Procedure Laterality Date  . 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 removal of melanoma of left foot/amputation of toes 1&25 Jul 2006  . WRIST FRACTURE SURGERY     74 years old-set    There were no vitals filed for this visit.  Subjective Assessment - 10/29/19 1514    Subjective  Pt states that he removed his wraps on Monday becuase he thought his appointment was at 4 but it was at 3. He states that he has not used his pump today but has been using his old pump daily.    Pertinent History  Lymphedema-has compression machine and uses stockings (wife helps); hx of PD/Parkinson's Plus Syndrone; malignant melanoma, hyperlipidemia, HTN, insomnia, obesity, hyperglycemia, dizziness, OSA, urinary urgency    Patient Stated Goals  get back in stockings and to put his his shoes on    Currently in Pain?  No/denies                  [facility]  Lymphedema Life Impact Scale Total  Score  14.71 %           [facility] Adult PT Treatment/Exercise - 10/29/19 0001      Manual Therapy   Manual Therapy  Manual Lymphatic Drainage (MLD);Compression Bandaging    Manual Lymphatic Drainage (MLD)   In supine: short neck, Bil axillary and inguinal lymph nodes, established BIl inguino-axillary anastomosis, medial to lateral L thigh then lateral L thigh to the knee, all surfaces of the lower leg and dorsum of the foot; then RLE medial to lateral thigh, lateral thigh, bottle neck at knee, posterior knee, all surfaces of the lower leg and dorsum of the foot; reworked all surfaces then deep abdominals.     Compression Bandaging  soft liner from foot to thigh placed Bil, In sitting 3 artiflex and 4 short stretch bandages from foot to groin Bil with foot to knee in sitting then knee to groin in standing.              PT Education - 10/29/19 1731    Education Details  Pt will continue to wear compression wrap as long as he is able and will work on getting home health help due to his spouse is unable to assist him with garments.    Person(s) Educated  Patient  Methods  Explanation    Comprehension  Verbalized understanding       PT Short Term Goals - 08/04/19 1604      PT SHORT TERM GOAL #1   Title  --    Baseline  --        PT Long Term Goals - 09/29/19 1717      PT LONG TERM GOAL #1   Title  Pt will decrease lymphedema volume in bilateral LEs to allow pt and wife to donn stockings at home for continued self care    Baseline  Continue with difficulty with night garments    Time  5    Period  Weeks    Status  On-going    Target Date  11/10/19            Plan - 10/29/19 1513    Clinical Impression Statement  Pt presents with wraps off after missing his appointment on Monday. The short stretch bandages were in a bag not rolled and the artiflex had been thrown away. Pt was not wearing his velcro compression or any compression on the thighs he had under lining for  velcro compression wrap donned on his lower legs with the one on the L lower leg folded constricting that limb. MLD wasperformed in supine and pt was wrapped wiht short stretch bandages from the foot to the groin. Discussed that he will need to be wrapped from the foot to the groin until he is able to get some type of compression from his foot to his groin. Also discussed getting home health help due to his spouse is unable to assist him with donning compression. Pt will benefit from continued POC at this time.    Personal Factors and Comorbidities  Age;Fitness;Comorbidity 2    Comorbidities  limited mobility, lymph node removal    Rehab Potential  Fair    Clinical Impairments Affecting Rehab Potential  history of parkinsons disease with limitations in ablilty to put on compression garments, see if pt likes Farrow wrap on LLE and if he wants to order for the R.    PT Frequency  2x / week    PT Treatment/Interventions  ADLs/Self Care Home Management;Therapeutic activities;Therapeutic exercise;Patient/family education;Orthotic Fit/Training;Manual techniques;Vasopneumatic Device;Compression bandaging;Manual lymph drainage;Functional mobility training;DME Instruction    PT Next Visit Plan  continue CDT of bil LEs until ind with velcro garments and pump, ask about shorts or edema wear.    Consulted and Agree with Plan of Care  Patient       Patient will benefit from skilled therapeutic intervention in order to improve the following deficits and impairments:  Decreased skin integrity, Postural dysfunction, Obesity, Increased edema, Difficulty walking, Decreased mobility  Visit Diagnosis: Lymphedema, not elsewhere classified  Other abnormalities of gait and mobility  Unsteadiness on feet  Muscle weakness (generalized)     Problem List Patient Active Problem List   Diagnosis Date Noted  . Strain of right biceps 09/12/2017  . Lymphedema 02/10/2017  . BPH associated with nocturia 05/16/2016  .  Senile purpura ([place]) 05/03/2016  . OSA (obstructive sleep apnea) 12/28/2015  . Hypersomnia 11/15/2015  . Cough 11/15/2015  . 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 Dizziness 12/30/2014  . Diastolic dysfunction 123456  . Former smoker 04/29/2014  . Chronic venous insufficiency 02/20/2014  . Hyperglycemia 08/02/2012  . Morbid obesity ([place]) 07/13/2010  . History of malignant melanoma. left leg 03/09/2009  . Insomnia 10/03/2007  . Hyperlipidemia 12/13/2006  . Essential hypertension 12/13/2006  Ander Purpura, PT 10/29/2019, 5:34 PM  [place], [place], [address] Phone: [phone]   Fax:  [phone]  Name: DAXTER STUBBINS MRN: [id number] Date of Birth: [birth year]

## 2019-10-30 ENCOUNTER — Other Ambulatory Visit: Payer: Self-pay

## 2019-10-30 DIAGNOSIS — I890 Lymphedema, not elsewhere classified: Secondary | ICD-10-CM

## 2019-10-30 NOTE — Telephone Encounter (Signed)
I never got a referral for him.

## 2019-10-30 NOTE — Telephone Encounter (Signed)
Sent order is that right

## 2019-10-30 NOTE — Telephone Encounter (Signed)
Do you know if we ever got this done?

## 2019-11-03 ENCOUNTER — Other Ambulatory Visit: Payer: Self-pay

## 2019-11-03 ENCOUNTER — Ambulatory Visit: Payer: PPO

## 2019-11-03 DIAGNOSIS — M6281 Muscle weakness (generalized): Secondary | ICD-10-CM

## 2019-11-03 DIAGNOSIS — R2689 Other abnormalities of gait and mobility: Secondary | ICD-10-CM | POA: Diagnosis not present

## 2019-11-03 DIAGNOSIS — R2681 Unsteadiness on feet: Secondary | ICD-10-CM

## 2019-11-03 DIAGNOSIS — I890 Lymphedema, not elsewhere classified: Secondary | ICD-10-CM

## 2019-11-03 NOTE — Therapy (Signed)
[place], [place], [address] Phone: [phone]   Fax:  [phone]  Physical Therapy Treatment  Patient Details  Name: Nicholas [name] MRN: [id number] Date of Birth: [birth year] Referring Provider (PT): Dr. [name]   Encounter Date: 11/03/2019  PT End of Session - 11/03/19 1612    Visit Number  36    Number of Visits  41    Date for PT Re-Evaluation  11/10/19    Authorization Type  [employer] (Medicare)-will need 10th visit progress note    PT Start Time  1505    PT Stop Time  1605    PT Time Calculation (min)  60 min    Activity Tolerance  Patient tolerated treatment well    Behavior During Therapy  [facility] for tasks assessed/performed       Past Medical History:  Diagnosis Date  . 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 Hyperlipidemia   . Hypertension   . Lymphedema     Past Surgical History:  Procedure Laterality Date  . 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 removal of melanoma of left foot/amputation of toes 1&25 Jul 2006  . WRIST FRACTURE SURGERY     74 years old-set    There were no vitals filed for this visit.  Subjective Assessment - 11/03/19 1613    Subjective  Pt states that he was able to leave his wraps on until Saturday. He states that his spouse no longer can assist him with donning compression garments.    Pertinent History  Lymphedema-has compression machine and uses stockings (wife helps); hx of PD/Parkinson's Plus Syndrone; malignant melanoma, hyperlipidemia, HTN, insomnia, obesity, hyperglycemia, dizziness, OSA, urinary urgency    Patient Stated Goals  get back in stockings and to put his his shoes on    Currently in Pain?  No/denies                  Outpatient Rehab from [facility]/02/2018 [facility]  Lymphedema Life Impact Scale Total Score  14.71 %           [facility]  Adult PT Treatment/Exercise - 11/03/19 0001      Manual Therapy   Manual Therapy  Manual Lymphatic Drainage (MLD);Compression Bandaging    Manual Lymphatic Drainage (MLD)   In supine: short neck, Bil axillary and inguinal lymph nodes, established BIl inguino-axillary anastomosis, medial to lateral L thigh then lateral L thigh to the knee, all surfaces of the lower leg and dorsum of the foot; then RLE medial to lateral thigh, lateral thigh, bottle neck at knee, posterior knee, all surfaces of the lower leg and dorsum of the foot; reworked all surfaces then deep abdominals.     Compression Bandaging  soft liner from foot to thigh placed Bil, In sitting 3 artiflex and 4 short stretch bandages from foot to groin Bil with foot to knee in sitting then knee to groin in standing.              PT Education - 11/03/19 1614    Education Details  Continueing to work on home health due to pt has no assistance at home. He will continue to use his lower leg pump, elevate and wear compression as much as possible.    Person(s) Educated  Patient    Methods  Explanation    Comprehension  Verbalized understanding  PT Short Term Goals - 08/04/19 1604      PT SHORT TERM GOAL #1   Title  --    Baseline  --        PT Long Term Goals - 09/29/19 1717      PT LONG TERM GOAL #1   Title  Pt will decrease lymphedema volume in bilateral LEs to allow pt and wife to donn stockings at home for continued self care    Baseline  Continue with difficulty with night garments    Time  5    Period  Weeks    Status  On-going    Target Date  11/10/19            Plan - 11/03/19 1612    Clinical Impression Statement  Pt presents to physical therapy with stockinette donned bilaterally, short stretch bandages and artiflex in a bag have been washed but not rolled. Pt was not wearing his velcro compression or any copmression on his thighs and stated that his spouse can no longer assist him with donning  compression. MLD was performed in supine and pt was wrapped with short stretch bandages from the foot to the groin. Pt skin coloring is improving and he is reducing but he will continue to need to be wrapped until he is able to get appropriate assistance at home. Pt will benefit from continued POC at this time.    Personal Factors and Comorbidities  Age;Fitness;Comorbidity 2    Comorbidities  limited mobility, lymph node removal    Rehab Potential  Fair    Clinical Impairments Affecting Rehab Potential  history of parkinsons disease with limitations in ablilty to put on compression garments, see if pt likes Farrow wrap on LLE and if he wants to order for the R.    PT Frequency  2x / week    PT Treatment/Interventions  ADLs/Self Care Home Management;Therapeutic activities;Therapeutic exercise;Patient/family education;Orthotic Fit/Training;Manual techniques;Vasopneumatic Device;Compression bandaging;Manual lymph drainage;Functional mobility training;DME Instruction    PT Next Visit Plan  continue CDT of bil LEs until ind with velcro garments and pump, ask about shorts or edema wear.    Consulted and Agree with Plan of Care  Patient    Family Member Consulted  wife       Patient will benefit from skilled therapeutic intervention in order to improve the following deficits and impairments:  Decreased skin integrity, Postural dysfunction, Obesity, Increased edema, Difficulty walking, Decreased mobility  Visit Diagnosis: Lymphedema, not elsewhere classified  Other abnormalities of gait and mobility  Unsteadiness on feet  Muscle weakness (generalized)     Problem List Patient Active Problem List   Diagnosis Date Noted  . Strain of right biceps 09/12/2017  . Lymphedema 02/10/2017  . BPH associated with nocturia 05/16/2016  . Senile purpura ([place]) 05/03/2016  . OSA (obstructive sleep apnea) 12/28/2015  . Hypersomnia 11/15/2015  . Cough 11/15/2015  . 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 Dizziness 12/30/2014  . Diastolic dysfunction 123456  . Former smoker 04/29/2014  . Chronic venous insufficiency 02/20/2014  . Hyperglycemia 08/02/2012  . Morbid obesity ([place]) 07/13/2010  . History of malignant melanoma. left leg 03/09/2009  . Insomnia 10/03/2007  . Hyperlipidemia 12/13/2006  . Essential hypertension 12/13/2006    Ander Purpura, PT 11/03/2019, 4:16 PM  [place], [place], [address] Phone: [phone]   Fax:  [phone]  Name: JESSEJAMES SHIMP MRN: [id number] Date of Birth: [birth year]

## 2019-11-05 ENCOUNTER — Ambulatory Visit: Payer: PPO

## 2019-11-05 ENCOUNTER — Other Ambulatory Visit: Payer: Self-pay

## 2019-11-05 DIAGNOSIS — R2681 Unsteadiness on feet: Secondary | ICD-10-CM

## 2019-11-05 DIAGNOSIS — I890 Lymphedema, not elsewhere classified: Secondary | ICD-10-CM

## 2019-11-05 DIAGNOSIS — R2689 Other abnormalities of gait and mobility: Secondary | ICD-10-CM

## 2019-11-05 DIAGNOSIS — M6281 Muscle weakness (generalized): Secondary | ICD-10-CM

## 2019-11-05 NOTE — Therapy (Signed)
[place], [place], [address] Phone: [phone]   Fax:  [phone]  Physical Therapy Treatment  Patient Details  Name: Nicholas [name] MRN: [id number] Date of Birth: 04/22/1946 Referring Provider (PT): Dr. [name]   Encounter Date: 11/05/2019  PT End of Session - 11/05/19 1412    Visit Number  37    Number of Visits  41    Date for PT Re-Evaluation  11/10/19    Authorization Type  [employer] (Medicare)-will need 10th visit progress note    PT Start Time  1405    PT Stop Time  1505    PT Time Calculation (min)  60 min    Activity Tolerance  Patient tolerated treatment well    Behavior During Therapy  [facility] for tasks assessed/performed       Past Medical History:  Diagnosis Date  . 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 Hyperlipidemia   . Hypertension   . Lymphedema     Past Surgical History:  Procedure Laterality Date  . 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 removal of melanoma of left foot/amputation of toes 1&25 Jul 2006  . WRIST FRACTURE SURGERY     74 years old-set    There were no vitals filed for this visit.  Subjective Assessment - 11/05/19 1413    Subjective  Pt states that he removed his wraps about 11 am. He states that he was doing really well with walking after removing his wraps. Pt states " I was able to walk to the car"    Pertinent History  Lymphedema-has compression machine and uses stockings (wife helps); hx of PD/Parkinson's Plus Syndrone; malignant melanoma, hyperlipidemia, HTN, insomnia, obesity, hyperglycemia, dizziness, OSA, urinary urgency    Patient Stated Goals  get back in stockings and to put his his shoes on    Currently in Pain?  No/denies    Multiple Pain Sites  No                  Outpatient Re[facility]  Lymphedema Life Impact Scale  Total Score  14.71 %           [facility] Adult PT Treatment/Exercise - 11/05/19 0001      Manual Therapy   Manual Therapy  Manual Lymphatic Drainage (MLD);Compression Bandaging    Manual Lymphatic Drainage (MLD)   In supine: short neck, Bil axillary and inguinal lymph nodes, established BIl inguino-axillary anastomosis, medial to lateral L thigh then lateral L thigh to the knee, all surfaces of the lower leg and dorsum of the foot; then RLE medial to lateral thigh, lateral thigh, bottle neck at knee, posterior knee, all surfaces of the lower leg and dorsum of the foot; reworked all surfaces then deep abdominals.     Compression Bandaging  soft liner from foot to thigh placed Bil, In sitting 3 artiflex and 4 short stretch bandages from foot to groin Bil in sitting due to pt has difficulty standing.              PT Education - 11/05/19 1530    Education Details  Pt was provided with instructions to look up edemawear and see if it is something he would like to try. Also discussed possibly getting measured by [facility] for compression capri's.    Person(s) Educated  Patient  Comprehension  Verbalized understanding       PT Short Term Goals - 08/04/19 1604      PT SHORT TERM GOAL #1   Title  --    Baseline  --        PT Long Term Goals - 09/29/19 1717      PT LONG TERM GOAL #1   Title  Pt will decrease lymphedema volume in bilateral LEs to allow pt and wife to donn stockings at home for continued self care    Baseline  Continue with difficulty with night garments    Time  5    Period  Weeks    Status  On-going    Target Date  11/10/19            Plan - 11/05/19 1412    Clinical Impression Statement  Pt presents to physical therapy with stockinette donned bilaterally with no compression donned. Spouse and pt were taught how to roll bandages and artiflex this session. Pt coloration and size is improving. He has significant softening of the bil thighs with decreased size  of lobule formation on Bil posterior thighs. MLD was performed in supine and pt was wrapped in sitting with short stretch bandages from the foot to the groin. Pt will benefit from continued POC at this time.    Personal Factors and Comorbidities  Age;Fitness;Comorbidity 2    Comorbidities  limited mobility, lymph node removal    Rehab Potential  Fair    Clinical Impairments Affecting Rehab Potential  history of parkinsons disease with limitations in ablilty to put on compression garments, see if pt likes Farrow wrap on LLE and if he wants to order for the R.    PT Frequency  2x / week    PT Treatment/Interventions  ADLs/Self Care Home Management;Therapeutic activities;Therapeutic exercise;Patient/family education;Orthotic Fit/Training;Manual techniques;Vasopneumatic Device;Compression bandaging;Manual lymph drainage;Functional mobility training;DME Instruction    PT Next Visit Plan  continue CDT of bil LEs until ind with velcro garments and pump, ask about shorts or edema wear.    Consulted and Agree with Plan of Care  Patient       Patient will benefit from skilled therapeutic intervention in order to improve the following deficits and impairments:  Decreased skin integrity, Postural dysfunction, Obesity, Increased edema, Difficulty walking, Decreased mobility  Visit Diagnosis: Lymphedema, not elsewhere classified  Other abnormalities of gait and mobility  Unsteadiness on feet  Muscle weakness (generalized)     Problem List Patient Active Problem List   Diagnosis Date Noted  . Strain of right biceps 09/12/2017  . Lymphedema 02/10/2017  . BPH associated with nocturia 05/16/2016  . Senile purpura ([place]) 05/03/2016  . OSA (obstructive sleep apnea) 12/28/2015  . Hypersomnia 11/15/2015  . Cough 11/15/2015  . 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 Dizziness 12/30/2014  . Diastolic dysfunction 123456  . Former smoker 04/29/2014  . Chronic venous insufficiency 02/20/2014   . Hyperglycemia 08/02/2012  . Morbid obesity ([place]) 07/13/2010  . History of malignant melanoma. left leg 03/09/2009  . Insomnia 10/03/2007  . Hyperlipidemia 12/13/2006  . Essential hypertension 12/13/2006    Ander Purpura, PT 11/05/2019, 3:34 PM  [place], [place], [address] Phone: [phone]   Fax:  [phone]  Name: Nicholas [name] MRN: [id number] Date of Birth: 04/05/1946

## 2019-11-10 ENCOUNTER — Ambulatory Visit: Payer: PPO

## 2019-11-10 ENCOUNTER — Other Ambulatory Visit: Payer: Self-pay

## 2019-11-10 DIAGNOSIS — I890 Lymphedema, not elsewhere classified: Secondary | ICD-10-CM

## 2019-11-10 DIAGNOSIS — R2689 Other abnormalities of gait and mobility: Secondary | ICD-10-CM

## 2019-11-10 DIAGNOSIS — R2681 Unsteadiness on feet: Secondary | ICD-10-CM

## 2019-11-10 DIAGNOSIS — M6281 Muscle weakness (generalized): Secondary | ICD-10-CM

## 2019-11-10 NOTE — Therapy (Signed)
[place], [place], [address] Phone: [phone]   Fax:  [phone]  Physical Therapy Progress Note  Progress Note Reporting Period 09/29/2019 to 11/10/2019  See note below for Objective Data and Assessment of Progress/Goals.       Patient Details  Name: Nicholas [name] MRN: [id number] Date of Birth: 1946-02-08 Referring Provider (PT): Dr. [name]   Encounter Date: 11/10/2019  PT End of Session - 11/10/19 1516    Visit Number  38    Number of Visits  65    Date for PT Re-Evaluation  12/15/19    Authorization Type  [employer] (Medicare)-will need 10th visit progress note    PT Start Time  1405    PT Stop Time  1505    PT Time Calculation (min)  60 min    Activity Tolerance  Patient tolerated treatment well    Behavior During Therapy  [facility] for tasks assessed/performed       Past Medical History:  Diagnosis Date  . 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 Hyperlipidemia   . Hypertension   . Lymphedema     Past Surgical History:  Procedure Laterality Date  . 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 removal of melanoma of left foot/amputation of toes 1&25 Jul 2006  . WRIST FRACTURE SURGERY     74 years old-set    There were no vitals filed for this visit.  Subjective Assessment - 11/10/19 1621    Subjective  Pt states that he took his wraps off on Saturday. He reports that he was walking really well this morning due to decreased fluid in the legs.    Pertinent History  Lymphedema-has compression machine and uses stockings (wife helps); hx of PD/Parkinson's Plus Syndrone; malignant melanoma, hyperlipidemia, HTN, insomnia, obesity, hyperglycemia, dizziness, OSA, urinary urgency    Patient Stated Goals  get back in stockings and to put his his shoes on    Currently in Pain?  No/denies    Multiple Pain Sites  No             LYMPHEDEMA/ONCOLOGY QUESTIONNAIRE - 11/10/19 1552      Right Lower Extremity Lymphedema   20 cm Proximal to Suprapatella  66 cm  (Pended)     10 cm Proximal to Suprapatella  61.7 cm  (Pended)     At Midpatella/Popliteal Crease  47.7 cm  (Pended)     30 cm Proximal to Floor at Lateral Plantar Foot  54.8 cm  (Pended)     20 cm Proximal to Floor at Lateral Plantar Foot  48.5 1  (Pended)     10 cm Proximal to Floor at Lateral Malleoli  39.3 cm  (Pended)     5 cm Proximal to 1st MTP Joint  33.9 cm  (Pended)     Across MTP Joint  30.1 cm  (Pended)     Around Proximal Great Toe  8.9 cm  (Pended)       Left Lower Extremity Lymphedema   20 cm Proximal to Suprapatella  65.6 cm  (Pended)     10 cm Proximal to Suprapatella  61.9 cm  (Pended)     At Midpatella/Popliteal Crease  46 cm  (Pended)     30 cm Proximal to Floor at Lateral Plantar Foot  54.5 cm  (Pended)     20 cm Proximal to  Floor at Lateral Plantar Foot  50.2 cm  (Pended)     10 cm Proximal to Floor at Lateral Malleoli  37.8 cm  (Pended)     5 cm Proximal to 1st MTP Joint  32.3 cm  (Pended)     Across MTP Joint  30.5 cm  (Pended)     Around Proximal Great Toe  10.6 cm  (Pended)            [facility]patient Re[facility]  Lymphedema Life Impact Scale Total Score  14.71 %           [facility] Adult PT Treatment/Exercise - 11/10/19 0001      Manual Therapy   Manual Therapy  Manual Lymphatic Drainage (MLD);Compression Bandaging    Manual Lymphatic Drainage (MLD)  In supine: short neck, swimming in the terminus, bil shoulder collectors, bil axillary and inguinal nodes, bil axillo-inguinal anastomosis, LE one at a time lateral thigh, medial to lateral thigh, lateral thigh, posterior knee, all surfaces o fhte lower leg, ankle, dorsum of the foot, re-worked all surfaces, deep abdominals prior to compressoin wrapping.     Compression Bandaging  soft liner from foot to thigh  placed Bil, In sitting 3 artiflex and 4 short stretch bandages from foot to groin Bil in sitting due to pt has difficulty standing.              PT Education - 11/10/19 1641    Education Details  Pt was assisted with getting set up with tactile medical flexitouch demonstration at home for above the groin for pt to see reduction with the flexitouch.    Person(s) Educated  Patient    Methods  Explanation    Comprehension  Verbalized understanding       PT Short Term Goals - 11/10/19 1642      PT SHORT TERM GOAL #1   Title  Pt will be independent with HEP for improved balance, transfers and gait.  Updated TARGET 03/01/18    Baseline  pt is working on exercises and using his pump daily    Status  Achieved        PT Long Term Goals - 11/10/19 1643      PT LONG TERM GOAL #1   Title  Pt will decrease lymphedema volume in bilateral LEs to allow pt and wife to donn stockings at home for continued self care    Baseline  pt and spouse continue with difficulty with garments    Time  5    Period  Weeks    Status  New    Target Date  12/15/19            Plan - 11/10/19 1515    Clinical Impression Statement  Pt continues with difficulty with garments. He is still waiting on insurance approval for vasopneumatic pump at appropriate level of compression due to pt currentlly has edema up to the groin. Since pt has been wrapped from foot to groin he has had a significant improvement in skin color in bil LE and has filled with less fluid when he goes long periods of time without compression. He has had some decreases throughout his leg with a few areas that he has had an increase in fluid. He continues with significant improvement in skin mobility in the bil lower legs demonstrated decreased fluid. MLD was performed in supine and pt was wrapped in sitting with short stretch bandages from the foot to the groin.  Pt will benefit from continued physical therapy services until he has the equipment  and help he needs at home in order to manage his lymphedema to decrease risk for infection/immobility/wounds related to fluid build up.    Personal Factors and Comorbidities  Age;Fitness;Comorbidity 2    Comorbidities  limited mobility, lymph node removal    Rehab Potential  Fair    Clinical Impairments Affecting Rehab Potential  history of parkinsons disease with limitations in ablilty to put on compression garments, see if pt likes Farrow wrap on LLE and if he wants to order for the R.    PT Frequency  2x / week    PT Duration  4 weeks    PT Treatment/Interventions  ADLs/Self Care Home Management;Therapeutic activities;Therapeutic exercise;Patient/family education;Orthotic Fit/Training;Manual techniques;Vasopneumatic Device;Compression bandaging;Manual lymph drainage;Functional mobility training;DME Instruction    PT Next Visit Plan  continue CDT of bil LEs until ind with velcro garments and pump, ask about shorts or edema wear.    Consulted and Agree with Plan of Care  Patient       Patient will benefit from skilled therapeutic intervention in order to improve the following deficits and impairments:  Decreased skin integrity, Postural dysfunction, Obesity, Increased edema, Difficulty walking, Decreased mobility  Visit Diagnosis: Lymphedema, not elsewhere classified  Other abnormalities of gait and mobility  Unsteadiness on feet  Muscle weakness (generalized)     Problem List Patient Active Problem List   Diagnosis Date Noted  . Strain of right biceps 09/12/2017  . Lymphedema 02/10/2017  . BPH associated with nocturia 05/16/2016  . Senile purpura ([place]) 05/03/2016  . OSA (obstructive sleep apnea) 12/28/2015  . Hypersomnia 11/15/2015  . Cough 11/15/2015  . 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 Dizziness 12/30/2014  . Diastolic dysfunction 123456  . Former smoker 04/29/2014  . Chronic venous insufficiency 02/20/2014  . Hyperglycemia 08/02/2012  . Morbid obesity  ([place]) 07/13/2010  . History of malignant melanoma. left leg 03/09/2009  . Insomnia 10/03/2007  . Hyperlipidemia 12/13/2006  . Essential hypertension 12/13/2006    Ander Purpura, PT 11/10/2019, 4:46 PM  [place], [place], [address] Phone: [phone]   Fax:  [phone]  Name: Nicholas [name] MRN: [id number] Date of Birth: 1946-01-04

## 2019-11-11 DIAGNOSIS — I890 Lymphedema, not elsewhere classified: Secondary | ICD-10-CM | POA: Diagnosis not present

## 2019-11-12 ENCOUNTER — Other Ambulatory Visit: Payer: Self-pay

## 2019-11-12 ENCOUNTER — Ambulatory Visit: Payer: PPO

## 2019-11-12 DIAGNOSIS — R2689 Other abnormalities of gait and mobility: Secondary | ICD-10-CM

## 2019-11-12 DIAGNOSIS — I890 Lymphedema, not elsewhere classified: Secondary | ICD-10-CM

## 2019-11-12 DIAGNOSIS — M6281 Muscle weakness (generalized): Secondary | ICD-10-CM

## 2019-11-12 DIAGNOSIS — R2681 Unsteadiness on feet: Secondary | ICD-10-CM

## 2019-11-12 NOTE — Therapy (Signed)
[place], [place], [address] Phone: [phone]   Fax:  [phone]  Physical Therapy Treatment  Patient Details  Name: Nicholas [name] MRN: [id number] Date of Birth: February 23, 1946 Referring Provider (PT): Dr. [name]   Encounter Date: 11/12/2019  PT End of Session - 11/12/19 1738    Visit Number  39    Number of Visits  55    Date for PT Re-Evaluation  12/15/19    Authorization Type  [employer] (Medicare)-will need 10th visit progress note    PT Start Time  1520    PT Stop Time  1606    PT Time Calculation (min)  46 min    Activity Tolerance  Patient tolerated treatment well    Behavior During Therapy  [facility] for tasks assessed/performed       Past Medical History:  Diagnosis Date  . 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 Hyperlipidemia   . Hypertension   . Lymphedema     Past Surgical History:  Procedure Laterality Date  . 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 removal of melanoma of left foot/amputation of toes 1&25 Jul 2006  . WRIST FRACTURE SURGERY     74 years old-set    There were no vitals filed for this visit.  Subjective Assessment - 11/12/19 1736    Subjective  Pt reports that his pump is getting delivered on 11/14/19 and that he removed his wraps at 6 am this morning.    Pertinent History  Lymphedema-has compression machine and uses stockings (wife helps); hx of PD/Parkinson's Plus Syndrone; malignant melanoma, hyperlipidemia, HTN, insomnia, obesity, hyperglycemia, dizziness, OSA, urinary urgency    Patient Stated Goals  get back in stockings and to put his his shoes on    Currently in Pain?  No/denies    Multiple Pain Sites  No                  Outpatient Re[facility]  Lymphedema Life Impact Scale Total Score  14.71 %           [facility] Adult PT  Treatment/Exercise - 11/12/19 0001      Manual Therapy   Manual Therapy  Manual Lymphatic Drainage (MLD);Compression Bandaging    Manual Lymphatic Drainage (MLD)  In supine: short neck, swimming in the terminus, bil shoulder collectors, bil axillary and inguinal nodes, bil axillo-inguinal anastomosis, LE one at a time lateral thigh, medial to lateral thigh, lateral thigh, posterior knee, all surfaces of the lower leg, ankle, dorsum of the foot, re-worked all surfaces only 2x due to time constraints, deep abdominals prior to compressoin wrapping.     Compression Bandaging  soft liner from foot to thigh placed Bil, In sitting 3 artiflex and 4 short stretch bandages from foot to groin Bil in sitting due to pt has difficulty standing.              PT Education - 11/12/19 1737    Education Details  Pt will continue wearing his wraps, elevation and using compression pump.    Person(s) Educated  Patient    Methods  Explanation    Comprehension  Verbalized understanding       PT Short Term Goals - 11/10/19 1642      PT SHORT TERM GOAL #1   Title  Pt will be independent with  HEP for improved balance, transfers and gait.  Updated TARGET 03/01/18    Baseline  pt is working on exercises and using his pump daily    Status  Achieved        PT Long Term Goals - 11/10/19 1643      PT LONG TERM GOAL #1   Title  Pt will decrease lymphedema volume in bilateral LEs to allow pt and wife to donn stockings at home for continued self care    Baseline  pt and spouse continue with difficulty with garments    Time  5    Period  Weeks    Status  New    Target Date  12/15/19            Plan - 11/12/19 1738    Clinical Impression Statement  Pt states there was traffic and arrived late for this treatment session; less time was spent on re-work following MLD. Pt continues with improved skin coloring and softening at the bil lower legs and thighs. MLD was performed in supine and pt was wrapped in  sitting with short stretch bandages from the foot to the groin. Discussed compression options including compression shorts and possibly doubled edema wear at BIl LE from foot to groin for possible compression option at home that will be easy to don. Pt will look up edema wear this afternoon. Pt will benefit from continued POC at this time.    Comorbidities  limited mobility, lymph node removal    Rehab Potential  Fair    Clinical Impairments Affecting Rehab Potential  history of parkinsons disease with limitations in ablilty to put on compression garments, see if pt likes Farrow wrap on LLE and if he wants to order for the R.    PT Frequency  2x / week    PT Duration  4 weeks    PT Treatment/Interventions  ADLs/Self Care Home Management;Therapeutic activities;Therapeutic exercise;Patient/family education;Orthotic Fit/Training;Manual techniques;Vasopneumatic Device;Compression bandaging;Manual lymph drainage;Functional mobility training;DME Instruction    PT Next Visit Plan  continue CDT of bil LEs until ind with velcro garments and pump, ask about shorts or edema wear.    Consulted and Agree with Plan of Care  Patient       Patient will benefit from skilled therapeutic intervention in order to improve the following deficits and impairments:  Decreased skin integrity, Postural dysfunction, Obesity, Increased edema, Difficulty walking, Decreased mobility  Visit Diagnosis: Lymphedema, not elsewhere classified  Other abnormalities of gait and mobility  Unsteadiness on feet  Muscle weakness (generalized)     Problem List Patient Active Problem List   Diagnosis Date Noted  . Strain of right biceps 09/12/2017  . Lymphedema 02/10/2017  . BPH associated with nocturia 05/16/2016  . Senile purpura ([place]) 05/03/2016  . OSA (obstructive sleep apnea) 12/28/2015  . Hypersomnia 11/15/2015  . Cough 11/15/2015  . 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 Dizziness 12/30/2014  . Diastolic  dysfunction 123456  . Former smoker 04/29/2014  . Chronic venous insufficiency 02/20/2014  . Hyperglycemia 08/02/2012  . Morbid obesity ([place]) 07/13/2010  . History of malignant melanoma. left leg 03/09/2009  . Insomnia 10/03/2007  . Hyperlipidemia 12/13/2006  . Essential hypertension 12/13/2006    Ander Purpura, PT 11/12/2019, 5:41 PM  [place], [place], [address] Phone: [phone]   Fax:  [phone]  Name: DRAKO REINERS MRN: [id number] Date of Birth: 04/17/1946

## 2019-11-18 ENCOUNTER — Ambulatory Visit: Payer: PPO

## 2019-11-18 ENCOUNTER — Other Ambulatory Visit: Payer: Self-pay

## 2019-11-18 DIAGNOSIS — I890 Lymphedema, not elsewhere classified: Secondary | ICD-10-CM

## 2019-11-18 DIAGNOSIS — M6281 Muscle weakness (generalized): Secondary | ICD-10-CM

## 2019-11-18 DIAGNOSIS — R2681 Unsteadiness on feet: Secondary | ICD-10-CM

## 2019-11-18 DIAGNOSIS — R2689 Other abnormalities of gait and mobility: Secondary | ICD-10-CM | POA: Diagnosis not present

## 2019-11-18 NOTE — Therapy (Signed)
[place], [place], [address] Phone: [phone]   Fax:  [phone]  Physical Therapy Treatment  Patient Details  Name: Nicholas [name] MRN: [id number] Date of Birth: 11-06-1945 Referring Provider (PT): Dr. [name]   Encounter Date: 11/18/2019  PT End of Session - 11/18/19 1733    Visit Number  40    Number of Visits  30    Date for PT Re-Evaluation  12/15/19    Authorization Type  [employer] (Medicare)-will need 10th visit progress note    PT Start Time  1610    PT Stop Time  1710    PT Time Calculation (min)  60 min    Activity Tolerance  Patient tolerated treatment well    Behavior During Therapy  [facility] for tasks assessed/performed       Past Medical History:  Diagnosis Date  . 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 Hyperlipidemia   . Hypertension   . Lymphedema     Past Surgical History:  Procedure Laterality Date  . 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 removal of melanoma of left foot/amputation of toes 1&25 Jul 2006  . WRIST FRACTURE SURGERY     74 years old-set    There were no vitals filed for this visit.  Subjective Assessment - 11/18/19 1731    Subjective  Pt states that he left his wraps on until Saturday. He has recieved his pump and is going to get a demonstration on monday.    Pertinent History  Lymphedema-has compression machine and uses stockings (wife helps); hx of PD/Parkinson's Plus Syndrone; malignant melanoma, hyperlipidemia, HTN, insomnia, obesity, hyperglycemia, dizziness, OSA, urinary urgency    Patient Stated Goals  get back in stockings and to put his his shoes on    Multiple Pain Sites  No                  Outpatient Re[facility]  Lymphedema Life Impact Scale Total Score  14.71 %           [facility] Adult PT Treatment/Exercise -  11/18/19 0001      Manual Therapy   Manual Therapy  Manual Lymphatic Drainage (MLD);Compression Bandaging    Manual Lymphatic Drainage (MLD)  In supine: short neck, swimming in the terminus, bil shoulder collectors, bil axillary and inguinal nodes, bil axillo-inguinal anastomosis, LE one at a time lateral thigh, medial to lateral thigh, lateral thigh, posterior knee, all surfaces of the lower leg, ankle, dorsum of the foot, re-worked all surfaces only 2x due to time constraints, deep abdominals prior to compressoin wrapping.     Compression Bandaging  TG soft liner from foot to thigh placed Bil, In sitting 3 artiflex and 4 short stretch bandages from foot to groin Bil in sitting due to pt has difficulty standing. ABD pad was placed at the dorsum of each foot and at the anteiror L lower leg due to difficult spots to help decrease edema/fibrosis.              PT Education - 11/18/19 1733    Education Details  pt will continue wearing his wraps, elevating and using compression pump at home. He is looking into assisted living facilities for help donning garments.    Person(s) Educated  Patient    Methods  Explanation    Comprehension  Verbalized understanding       PT Short Term Goals - 11/10/19 1642      PT SHORT TERM GOAL #1   Title  Pt will be independent with HEP for improved balance, transfers and gait.  Updated TARGET 03/01/18    Baseline  pt is working on exercises and using his pump daily    Status  Achieved        PT Long Term Goals - 11/10/19 1643      PT LONG TERM GOAL #1   Title  Pt will decrease lymphedema volume in bilateral LEs to allow pt and wife to donn stockings at home for continued self care    Baseline  pt and spouse continue with difficulty with garments    Time  5    Period  Weeks    Status  New    Target Date  12/15/19            Plan - 11/18/19 1734    Clinical Impression Statement  Pt 10th visit progress note was written up 2 visits ago for POC.  He continues with edema in his BLE but has softening in bil thighs and colors continues improved in the lower legs. MLD was performed in supine and pt was wrapped in sitting with short stretch bandages from the foot to the groin. Pt has ordered some edema wear and has recieved his tactile pump that he will get a demonstration of on Monday. Pt will benefit from continued POC at this time.    Personal Factors and Comorbidities  Age;Fitness;Comorbidity 2    Comorbidities  limited mobility, lymph node removal    Rehab Potential  Fair    Clinical Impairments Affecting Rehab Potential  history of parkinsons disease with limitations in ablilty to put on compression garments, see if pt likes Farrow wrap on LLE and if he wants to order for the R.    PT Frequency  2x / week    PT Duration  4 weeks    PT Treatment/Interventions  ADLs/Self Care Home Management;Therapeutic activities;Therapeutic exercise;Patient/family education;Orthotic Fit/Training;Manual techniques;Vasopneumatic Device;Compression bandaging;Manual lymph drainage;Functional mobility training;DME Instruction    PT Next Visit Plan  continue CDT of bil LEs until ind with velcro garments and pump, ask about shorts or edema wear.    Consulted and Agree with Plan of Care  Patient       Patient will benefit from skilled therapeutic intervention in order to improve the following deficits and impairments:  Decreased skin integrity, Postural dysfunction, Obesity, Increased edema, Difficulty walking, Decreased mobility  Visit Diagnosis: Other abnormalities of gait and mobility  Lymphedema, not elsewhere classified  Unsteadiness on feet  Muscle weakness (generalized)     Problem List Patient Active Problem List   Diagnosis Date Noted  . Strain of right biceps 09/12/2017  . Lymphedema 02/10/2017  . BPH associated with nocturia 05/16/2016  . Senile purpura ([place]) 05/03/2016  . OSA (obstructive sleep apnea) 12/28/2015  . Hypersomnia  11/15/2015  . Cough 11/15/2015  . 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 Dizziness 12/30/2014  . Diastolic dysfunction 123456  . Former smoker 04/29/2014  . Chronic venous insufficiency 02/20/2014  . Hyperglycemia 08/02/2012  . Morbid obesity ([place]) 07/13/2010  . History of malignant melanoma. left leg 03/09/2009  . Insomnia 10/03/2007  . Hyperlipidemia 12/13/2006  . Essential hypertension 12/13/2006    Ander Purpura , PT 11/18/2019, 5:36 PM  [place], [place],  [id number] Phone: [phone]   Fax:  [phone]  Name: DAKSH STOCKLEY MRN: [id number] Date of Birth: 1946/06/01

## 2019-11-25 ENCOUNTER — Other Ambulatory Visit: Payer: Self-pay

## 2019-11-25 ENCOUNTER — Other Ambulatory Visit: Payer: Self-pay | Admitting: Neurology

## 2019-11-25 ENCOUNTER — Ambulatory Visit: Payer: PPO | Attending: Family Medicine

## 2019-11-25 DIAGNOSIS — R2681 Unsteadiness on feet: Secondary | ICD-10-CM

## 2019-11-25 DIAGNOSIS — R2689 Other abnormalities of gait and mobility: Secondary | ICD-10-CM

## 2019-11-25 DIAGNOSIS — I890 Lymphedema, not elsewhere classified: Secondary | ICD-10-CM

## 2019-11-25 DIAGNOSIS — M6281 Muscle weakness (generalized): Secondary | ICD-10-CM | POA: Diagnosis not present

## 2019-11-25 NOTE — Therapy (Signed)
Nicholas [name], [place], [address] Phone: [phone]   Fax:  [phone]  Physical Therapy Treatment  Patient Details  Name: Nicholas [name] MRN: [id number] Date of Birth: February 03, 1946 Referring Provider (PT): Dr. [name]   Encounter Date: 11/25/2019  PT End of Session - 11/25/19 1743    Visit Number  41    Number of Visits  61    Date for PT Re-Evaluation  12/15/19    Authorization Type  [employer] (Medicare)-will need 10th visit progress note    PT Start Time  1612    PT Stop Time  1715    PT Time Calculation (min)  63 min    Activity Tolerance  Patient tolerated treatment well    Behavior During Therapy  [facility] for tasks assessed/performed       Past Medical History:  Diagnosis Date  . 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 Hyperlipidemia   . Hypertension   . Lymphedema     Past Surgical History:  Procedure Laterality Date  . 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 removal of melanoma of left foot/amputation of toes 1&25 Jul 2006  . WRIST FRACTURE SURGERY     74 years old-set    There were no vitals filed for this visit.  Subjective Assessment - 11/25/19 1745    Subjective  Pt states that they tried to don the edema wear at home but it got rolled up and then they removed it. He states that he had Nicholas [name] from [employer] come and teach him how to use the pump yesterday and he thinks that it will be possible.    Pertinent History  Lymphedema-has compression machine and uses stockings (wife helps); hx of PD/Parkinson's Plus Syndrone; malignant melanoma, hyperlipidemia, HTN, insomnia, obesity, hyperglycemia, dizziness, OSA, urinary urgency    Patient Stated Goals  get back in stockings and to put his his shoes on    Currently in Pain?  No/denies                  Outpatient Re[facility]/02/2018 [facility]  Lymphedema Life Impact Scale Total Score  14.71 %           [facility] Adult PT Treatment/Exercise - 11/25/19 0001      Manual Therapy   Manual Therapy  Manual Lymphatic Drainage (MLD);Compression Bandaging    Manual Lymphatic Drainage (MLD)  In supine: short neck, swimming in the terminus, bil shoulder collectors, bil axillary and inguinal nodes, bil axillo-inguinal anastomosis, LE one at a time lateral thigh, medial to lateral thigh, lateral thigh, posterior knee, all surfaces of the lower leg, ankle, dorsum of the foot, re-worked all surfaces only 2x due to time constraints, deep abdominals prior to compressoin wrapping.     Compression Bandaging  Edema wear in supine then in sitting 2 artiflex and then 4 short stretch bandages from foot to groin.              PT Education - 11/25/19 1744    Education Details  Pt will use his tactile medical pump atleast 5 days out of the week and keep the edema wear even when he removes his compression wraps. Discussed how to don edema wear with pt spouse.    Person(s) Educated  Patient    Methods  Explanation    Comprehension  Verbalized understanding  PT Short Term Goals - 11/10/19 1642      PT SHORT TERM GOAL #1   Title  Pt will be independent with HEP for improved balance, transfers and gait.  Updated TARGET 03/01/18    Baseline  pt is working on exercises and using his pump daily    Status  Achieved        PT Long Term Goals - 11/10/19 1643      PT LONG TERM GOAL #1   Title  Pt will decrease lymphedema volume in bilateral LEs to allow pt and wife to donn stockings at home for continued self care    Baseline  pt and spouse continue with difficulty with garments    Time  5    Period  Weeks    Status  New    Target Date  12/15/19            Plan - 11/25/19 1741    Clinical Impression Statement  Pt presents to physical therapy with continued edema in his Bil LE. He has recieved his edema wear and was assisted  into it this session. light wrapping over the edema wear to facilitate fluid flow. He just yesterday was educated how to use the pump from [employer]. Discussed with patient that he will benefit from another pair of edema wear to adequately contain fluid in the BLE. MLD was performed with the pt in supine and edema wear was applied with legs elevated. Pt bil LE were wrapped from foot to groin with pt in sitting. Pt will benefit from continued POC at this time.    Personal Factors and Comorbidities  Age;Fitness;Comorbidity 2    Comorbidities  limited mobility, lymph node removal    Rehab Potential  Fair    Clinical Impairments Affecting Rehab Potential  history of parkinsons disease with limitations in ablilty to put on compression garments, see if pt likes Farrow wrap on LLE and if he wants to order for the R.    PT Frequency  2x / week    PT Duration  4 weeks    PT Treatment/Interventions  ADLs/Self Care Home Management;Therapeutic activities;Therapeutic exercise;Patient/family education;Orthotic Fit/Training;Manual techniques;Vasopneumatic Device;Compression bandaging;Manual lymph drainage;Functional mobility training;DME Instruction    PT Next Visit Plan  continue CDT of bil LEs until ind with velcro garments and pump, ask about shorts or edema wear.    Consulted and Agree with Plan of Care  Patient       Patient will benefit from skilled therapeutic intervention in order to improve the following deficits and impairments:  Decreased skin integrity, Postural dysfunction, Obesity, Increased edema, Difficulty walking, Decreased mobility  Visit Diagnosis: Other abnormalities of gait and mobility  Lymphedema, not elsewhere classified  Unsteadiness on feet  Muscle weakness (generalized)     Problem List Patient Active Problem List   Diagnosis Date Noted  . Strain of right biceps 09/12/2017  . Lymphedema 02/10/2017  . BPH associated with nocturia 05/16/2016  . Senile purpura ([place])  05/03/2016  . OSA (obstructive sleep apnea) 12/28/2015  . Hypersomnia 11/15/2015  . Cough 11/15/2015  . 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 Dizziness 12/30/2014  . Diastolic dysfunction 123456  . Former smoker 04/29/2014  . Chronic venous insufficiency 02/20/2014  . Hyperglycemia 08/02/2012  . Morbid obesity ([place]) 07/13/2010  . History of malignant melanoma. left leg 03/09/2009  . Insomnia 10/03/2007  . Hyperlipidemia 12/13/2006  . Essential hypertension 12/13/2006    Ander Purpura, PT 11/25/2019, 5:46 PM  [place], [place], [address] Phone: [phone]   Fax:  [phone]  Name: Nicholas [name] MRN: [id number] Date of Birth: 02-05-1946

## 2019-11-26 ENCOUNTER — Ambulatory Visit: Payer: PPO | Admitting: Neurology

## 2019-11-27 ENCOUNTER — Ambulatory Visit: Payer: PPO

## 2019-11-27 ENCOUNTER — Ambulatory Visit: Payer: PPO | Admitting: Neurology

## 2019-12-02 ENCOUNTER — Ambulatory Visit: Payer: PPO

## 2019-12-02 ENCOUNTER — Other Ambulatory Visit: Payer: Self-pay

## 2019-12-02 DIAGNOSIS — R2681 Unsteadiness on feet: Secondary | ICD-10-CM

## 2019-12-02 DIAGNOSIS — R2689 Other abnormalities of gait and mobility: Secondary | ICD-10-CM

## 2019-12-02 DIAGNOSIS — M6281 Muscle weakness (generalized): Secondary | ICD-10-CM

## 2019-12-02 DIAGNOSIS — I890 Lymphedema, not elsewhere classified: Secondary | ICD-10-CM

## 2019-12-02 NOTE — Therapy (Signed)
[place], [place], [address] Phone: [phone]   Fax:  [phone]  Physical Therapy Treatment  Patient Details  Name: Nicholas [name] MRN: [id number] Date of Birth: 1946/07/06 Referring Provider (PT): Dr. [name]   Encounter Date: 12/02/2019  PT End of Session - 12/02/19 1726    Visit Number  42    Number of Visits  13    Date for PT Re-Evaluation  12/15/19    Authorization Type  [employer] (Medicare)-will need 10th visit progress note    PT Start Time  1615    PT Stop Time  1708    PT Time Calculation (min)  53 min    Activity Tolerance  Patient tolerated treatment well    Behavior During Therapy  [facility] for tasks assessed/performed       Past Medical History:  Diagnosis Date  . 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 Hyperlipidemia   . Hypertension   . Lymphedema     Past Surgical History:  Procedure Laterality Date  . 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 removal of melanoma of left foot/amputation of toes 1&25 Jul 2006  . WRIST FRACTURE SURGERY     74 years old-set    There were no vitals filed for this visit.  Subjective Assessment - 12/02/19 1729    Subjective  Pt states that he removed his wraps on Thursday. No compression has been on his legs since that day. He has used his flexitouch but has not used the trunk piece.    Pertinent History  Lymphedema-has compression machine and uses stockings (wife helps); hx of PD/Parkinson's Plus Syndrone; malignant melanoma, hyperlipidemia, HTN, insomnia, obesity, hyperglycemia, dizziness, OSA, urinary urgency    Patient Stated Goals  get back in stockings and to put his his shoes on    Currently in Pain?  No/denies                  Outpatient [facility]02/[facility]  Lymphedema Life Impact Scale Total Score  14.71 %            [facility] Adult PT Treatment/Exercise - 12/02/19 0001      Manual Therapy   Manual Therapy  Manual Lymphatic Drainage (MLD);Compression Bandaging;Edema management    Edema Management  Pt was assisted wtih donning edema wear on the LLE doubled to get 30-40 mmHg of compression on the leg.     Manual Lymphatic Drainage (MLD)  In supine: short neck, swimming in the terminus, bil shoulder collectors, bil axillary and inguinal nodes, bil axillo-inguinal anastomosis, LE one at a time lateral thigh, medial to lateral thigh, lateral thigh, posterior knee, all surfaces of the lower leg, ankle, dorsum of the foot, re-worked all surfaces only 2x due to time constraints, deep abdominals prior to compressoin wrapping.     Compression Bandaging  in sitting thin stockinette, 4 artiflex and 4 short stretch bandages were donned on the RLE due to increased edema and 1 ABD pad was placed on the lateral lower leg due to weeping.              PT Education - 12/02/19 1727    Education Details  Pt will use his trunk piece a couple of times over the next week. He will see how the edema wear works on the RLE and pt will contact  the 4 home health agencies that he has recieved information on in order to get help at home with donning compression garments.    Person(s) Educated  Patient    Methods  Explanation    Comprehension  Verbalized understanding       PT Short Term Goals - 11/10/19 1642      PT SHORT TERM GOAL #1   Title  Pt will be independent with HEP for improved balance, transfers and gait.  Updated TARGET 03/01/18    Baseline  pt is working on exercises and using his pump daily    Status  Achieved        PT Long Term Goals - 11/10/19 1643      PT LONG TERM GOAL #1   Title  Pt will decrease lymphedema volume in bilateral LEs to allow pt and wife to donn stockings at home for continued self care    Baseline  pt and spouse continue with difficulty with garments    Time  5    Period  Weeks     Status  New    Target Date  12/15/19            Plan - 12/02/19 1724    Clinical Impression Statement  Pt presents to physical therapy with increased edema in BLE due to missing appointment last week. Pt states that he had work meeting. This session double edema wear was donned on the LUE following MLD to see if this is sufficient in managing edema; if it is he will purchase 2 more for the RLE. The RLE was wrapped in compression wrap using 4 short stretch bandages following MLD. He has been using his flexitouch but has not yet used the trunk piece and time was spent today educating pt on the anatomy of the lymphatic system as well as physiology and how the trunk piece will assist in moving fluid. Pt will benefit from continued POC at this time.    Personal Factors and Comorbidities  Age;Fitness;Comorbidity 2    Comorbidities  limited mobility, lymph node removal    Rehab Potential  Fair    Clinical Impairments Affecting Rehab Potential  history of parkinsons disease with limitations in ablilty to put on compression garments, see if pt likes Farrow wrap on LLE and if he wants to order for the R.    PT Frequency  2x / week    PT Duration  4 weeks    PT Treatment/Interventions  ADLs/Self Care Home Management;Therapeutic activities;Therapeutic exercise;Patient/family education;Orthotic Fit/Training;Manual techniques;Vasopneumatic Device;Compression bandaging;Manual lymph drainage;Functional mobility training;DME Instruction    PT Next Visit Plan  continue CDT of bil LEs until ind with velcro garments and pump, ask about shorts or edema wear.    Consulted and Agree with Plan of Care  Patient    Family Member Consulted  wife       Patient will benefit from skilled therapeutic intervention in order to improve the following deficits and impairments:  Decreased skin integrity, Postural dysfunction, Obesity, Increased edema, Difficulty walking, Decreased mobility  Visit Diagnosis: Other  abnormalities of gait and mobility  Lymphedema, not elsewhere classified  Unsteadiness on feet  Muscle weakness (generalized)     Problem List Patient Active Problem List   Diagnosis Date Noted  . Strain of right biceps 09/12/2017  . Lymphedema 02/10/2017  . BPH associated with nocturia 05/16/2016  . Senile purpura ([place]) 05/03/2016  . OSA (obstructive sleep apnea) 12/28/2015  . Hypersomnia 11/15/2015  . Cough 11/15/2015  .  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 Dizziness 12/30/2014  . Diastolic dysfunction 123456  . Former smoker 04/29/2014  . Chronic venous insufficiency 02/20/2014  . Hyperglycemia 08/02/2012  . Morbid obesity ([place]) 07/13/2010  . History of malignant melanoma. left leg 03/09/2009  . Insomnia 10/03/2007  . Hyperlipidemia 12/13/2006  . Essential hypertension 12/13/2006    Ander Purpura, PT 12/02/2019, 5:30 PM  [place], [place], [address] Phone: [phone]   Fax:  [phone]  Name: AJAVION FELTUS MRN: [id number] Date of Birth: 1946-05-11

## 2019-12-04 ENCOUNTER — Other Ambulatory Visit: Payer: Self-pay

## 2019-12-04 ENCOUNTER — Ambulatory Visit: Payer: PPO

## 2019-12-04 DIAGNOSIS — I890 Lymphedema, not elsewhere classified: Secondary | ICD-10-CM

## 2019-12-04 DIAGNOSIS — R2681 Unsteadiness on feet: Secondary | ICD-10-CM

## 2019-12-04 DIAGNOSIS — M6281 Muscle weakness (generalized): Secondary | ICD-10-CM

## 2019-12-04 DIAGNOSIS — R2689 Other abnormalities of gait and mobility: Secondary | ICD-10-CM | POA: Diagnosis not present

## 2019-12-04 NOTE — Therapy (Signed)
[place], [place], [address] Phone: [phone]   Fax:  [phone]  Physical Therapy Treatment  Patient Details  Name: Nicholas [name] MRN: [id number] Date of Birth: [birth year] Referring Provider (PT): Dr. [name]   Encounter Date: 12/04/2019  PT End of Session - 12/04/19 1716    Visit Number  43    Number of Visits  3    Date for PT Re-Evaluation  12/15/19    Authorization Type  [employer] (Medicare)-will need 10th visit progress note    PT Start Time  1615    PT Stop Time  1708    PT Time Calculation (min)  53 min    Activity Tolerance  Patient tolerated treatment well    Behavior During Therapy  [facility] for tasks assessed/performed       Past Medical History:  Diagnosis Date  . 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 Hyperlipidemia   . Hypertension   . Lymphedema     Past Surgical History:  Procedure Laterality Date  . 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 removal of melanoma of left foot/amputation of toes 1&25 Jul 2006  . WRIST FRACTURE SURGERY     74 years old-set    There were no vitals filed for this visit.  Subjective Assessment - 12/04/19 1718    Subjective  Pt states that he just removed his compression this morning to take a shower. He has continued to use his pump but has not yet used the trunk piece.    Pertinent History  Lymphedema-has compression machine and uses stockings (wife helps); hx of PD/Parkinson's Plus Syndrone; malignant melanoma, hyperlipidemia, HTN, insomnia, obesity, hyperglycemia, dizziness, OSA, urinary urgency    Patient Stated Goals  get back in stockings and to put his his shoes on    Currently in Pain?  No/denies    Multiple Pain Sites  No                   Outpatient [facility]  Lymphedema Life Impact Scale Total Score  14.71 %            [facility] Adult PT Treatment/Exercise - 12/04/19 0001      Manual Therapy   Manual Therapy  Manual Lymphatic Drainage (MLD);Compression Bandaging;Edema management    Edema Management  Pt was assisted wtih donning edema wear on the LLE doubled to get 30-40 mmHg of compression on the leg.     Manual Lymphatic Drainage (MLD)  In supine: short neck, swimming in the terminus, bil shoulder collectors, bil axillary and inguinal nodes, bil axillo-inguinal anastomosis, LE one at a time lateral thigh, medial to lateral thigh, lateral thigh, posterior knee, all surfaces of the lower leg, ankle, dorsum of the foot, re-worked all surfaces only 2x due to time constraints, deep abdominals prior to compressoin wrapping.     Compression Bandaging  in sitting thin stockinette, 4 artiflex and 6 short stretch bandages were donned on the RLE due to increased edema and 1 ABD pad was placed on the lateral lower leg due to weeping.              PT Education - 12/04/19 1716    Education Details  Pt will use his trunk piece over the weekend. He will continue to wear edema wear and continue working on getting home  health help to don his compression garments.    Person(s) Educated  Patient    Methods  Explanation    Comprehension  Verbalized understanding       PT Short Term Goals - 11/10/19 1642      PT SHORT TERM GOAL #1   Title  Pt will be independent with HEP for improved balance, transfers and gait.  Updated TARGET 03/01/18    Baseline  pt is working on exercises and using his pump daily    Status  Achieved        PT Long Term Goals - 11/10/19 1643      PT LONG TERM GOAL #1   Title  Pt will decrease lymphedema volume in bilateral LEs to allow pt and wife to donn stockings at home for continued self care    Baseline  pt and spouse continue with difficulty with garments    Time  5    Period  Weeks    Status  New    Target Date  12/15/19            Plan - 12/04/19 1714     Clinical Impression Statement  Pt presents to physical therapy with improved edema in his BLE and had good reduction from doubled edema wear on the LLE; he will purchase 2 more for the RLE. DOuble edema wear was donned o the LUE following MLD again this session. The RLE was wrapped in compressoin wrap using 6 short stretch bandages following MLD. He continues to use his flexitouch and will use the trunk piece over the weekend. Pt will benefit from continued POC at this time.    Personal Factors and Comorbidities  Age;Fitness;Comorbidity 2    Comorbidities  limited mobility, lymph node removal    Rehab Potential  Fair    Clinical Impairments Affecting Rehab Potential  history of parkinsons disease with limitations in ablilty to put on compression garments, see if pt likes Farrow wrap on LLE and if he wants to order for the R.    PT Frequency  2x / week    PT Duration  4 weeks    PT Treatment/Interventions  ADLs/Self Care Home Management;Therapeutic activities;Therapeutic exercise;Patient/family education;Orthotic Fit/Training;Manual techniques;Vasopneumatic Device;Compression bandaging;Manual lymph drainage;Functional mobility training;DME Instruction    PT Next Visit Plan  continue CDT of bil LEs until ind with velcro garments and pump, ask about shorts or edema wear.    Consulted and Agree with Plan of Care  Patient       Patient will benefit from skilled therapeutic intervention in order to improve the following deficits and impairments:  Decreased skin integrity, Postural dysfunction, Obesity, Increased edema, Difficulty walking, Decreased mobility  Visit Diagnosis: Other abnormalities of gait and mobility  Lymphedema, not elsewhere classified  Unsteadiness on feet  Muscle weakness (generalized)     Problem List Patient Active Problem List   Diagnosis Date Noted  . Strain of right biceps 09/12/2017  . Lymphedema 02/10/2017  . BPH associated with nocturia 05/16/2016  . Senile  purpura ([place]) 05/03/2016  . OSA (obstructive sleep apnea) 12/28/2015  . Hypersomnia 11/15/2015  . Cough 11/15/2015  . 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 Dizziness 12/30/2014  . Diastolic dysfunction 123456  . Former smoker 04/29/2014  . Chronic venous insufficiency 02/20/2014  . Hyperglycemia 08/02/2012  . Morbid obesity ([place]) 07/13/2010  . History of malignant melanoma. left leg 03/09/2009  . Insomnia 10/03/2007  . Hyperlipidemia 12/13/2006  . Essential hypertension 12/13/2006  Ander Purpura, PT 12/04/2019, 5:18 PM  [place], [place], [address] Phone: [phone]   Fax:  [phone]  Name: LAIM BOOGAARD MRN: [id number] Date of Birth: 05/15/1946

## 2019-12-11 NOTE — Progress Notes (Signed)
Assessment/Plan:   1.  Parkinsonism, differential being PD versus PSP  -Continue carbidopa/levodopa 25/100, 2 tablets 4 times per day  -discussed motorized WC again.  Don't think scooter candidate as he cannot mount recumbant bike at home and has significant trouble lifting legs.  He is agreeable to trying to get [facility] approval after our discussion today.  We will get mobility PT evaluation set up.  Patient has mental capacity to use the power wheelchair.  Manual wheelchair not appropriate because of very significant lymphedema in the lower extremities and poor endurance making propelling it impossible.  Traditional walker not abuse as patient has significant freezing. 2.  History of melanoma  -Follows closely with dermatology.  Has upcoming visit in the next 1-2 months.   3.  Urinary incontinence  -Follows with urology.  Had side effects with Myrbetriq 4.  Lymphedema  -Patient wearing compression stockings.  Wife having difficulty getting them on.  They asked about whether or not Medicare would pay for alternative care in the home, which would assist with things like help in getting him dressed.  Discussed that Medicare would not pay for this.  He does not have long-term care insurance.  Discussed that he would need to pay for this out of pocket, and we would be happy to get him home companies that do this if they are interested.  Subjective:   Nicholas [name] was seen today in follow up for [name] disease.  My previous records were reviewed prior to todays visit as well as outside records available to me. Pt and I discussed last visit that I really thought it was time for motorized scooter or motorized wheelchair due to excessive freezing.  Patient had declined.  Reports today that "I am not ready."   Physical therapy notes are reviewed.  One fall - bent over to get remote control and fell backwards.  EMS had to come and get him off of the floor after being on floor for 2 hours.  Has trouble  with activities of daily living, including getting to the restroom on time.  Has difficulty navigating in and out of the shower.  No trouble swallowing.  He doesn't drive.  No diplopia.    Current prescribed movement disorder medications: Carbidopa/levodopa 25/100, 2 tablets 4 times per day.   ALLERGIES:   Allergies  Allergen Reactions  . Amoxicillin Swelling    Presumed angioedema of lips due to prednisone    CURRENT MEDICATIONS:  Outpatient Encounter Medications as of 12/12/2019  Medication Sig  . carbidopa-levodopa (SINEMET IR) 25-100 MG tablet TAKE 2 TABLETS AT 7AM,11AM, 3PM, AND AT 7PM.  . fesoterodine (TOVIAZ) 8 MG TB24 tablet Take 8 mg by mouth daily.  Marland Kitchen ibuprofen (ADVIL,MOTRIN) 200 MG tablet Take 400 mg by mouth every 6 (six) hours as needed for moderate pain.  . rosuvastatin (CRESTOR) 10 MG tablet Take 1 tablet (10 mg total) by mouth daily.  Marland Kitchen triamcinolone cream (KENALOG) 0.1 % Apply 1 application topically as needed.   . [DISCONTINUED] diphenhydrAMINE (BENADRYL) 25 mg capsule Take 25 mg by mouth at bedtime.   No facility-administered encounter medications on file as of 12/12/2019.    Objective:   PHYSICAL EXAMINATION:    VITALS:   Vitals:   12/12/19 1056  BP: 136/81  Pulse: 87  SpO2: 96%  Weight: 255 lb (115.7 kg)  Height: 5\' 7"  (1.702 m)    GEN:  The patient appears stated age and is in NAD. HEENT:  Normocephalic, atraumatic.  The mucous membranes are moist. The superficial temporal arteries are without ropiness or tenderness. CV:  RRR Lungs:  CTAB Neck/HEME:  There are no carotid bruits bilaterally. Exts:  Very significant lymphedema with attempts at compression stockings in the lower extremities (but wife has difficulty getting them on)  Neurological examination:  Orientation: The patient is alert and oriented x3. Cranial nerves: There is good facial symmetry with significant facial hypomimia. The speech is fluent and clear. Soft palate rises  symmetrically and there is no tongue deviation. Hearing is intact to conversational tone. Sensation: Sensation is intact to light touch throughout Motor: Strength is at least antigravity x4.  Movement examination: Tone: There is mild increased tone in the bilateral UE, R greater than L (he was past due for med and took them when I reminded him) Abnormal movements: none Coordination:  There is no decremation with RAM's in UE Gait and Station: not tested, as patient is wheelchair-bound  I have reviewed and interpreted the following labs independently    Chemistry      Component Value Date/Time   NA 140 10/03/2019 1517   K 4.7 10/03/2019 1517   CL 105 10/03/2019 1517   CO2 26 10/03/2019 1517   BUN 28 (H) 10/03/2019 1517   CREATININE 1.01 10/03/2019 1517      Component Value Date/Time   CALCIUM 9.3 10/03/2019 1517   ALKPHOS 64 06/19/2018 1134   AST 11 10/03/2019 1517   ALT 5 (L) 10/03/2019 1517   BILITOT 0.5 10/03/2019 1517       Lab Results  Component Value Date   WBC 6.8 10/03/2019   HGB 14.4 10/03/2019   HCT 44.3 10/03/2019   MCV 92.3 10/03/2019   PLT 162 10/03/2019    Lab Results  Component Value Date   TSH 1.42 06/19/2018     Total time spent on today's visit was 40 minutes, including both face-to-face time and nonface-to-face time.  Time included that spent on review of records (prior notes available to me/labs/imaging if pertinent), discussing treatment and goals, answering patient's questions and coordinating care.  Cc:  [name], MD

## 2019-12-12 ENCOUNTER — Ambulatory Visit: Payer: PPO | Admitting: Neurology

## 2019-12-12 ENCOUNTER — Other Ambulatory Visit: Payer: Self-pay

## 2019-12-12 ENCOUNTER — Encounter: Payer: Self-pay | Admitting: Neurology

## 2019-12-12 VITALS — BP 136/81 | HR 87 | Ht 67.0 in | Wt 255.0 lb

## 2019-12-12 DIAGNOSIS — G20 Parkinson's disease: Secondary | ICD-10-CM

## 2019-12-12 DIAGNOSIS — Z85820 Personal history of malignant melanoma of skin: Secondary | ICD-10-CM

## 2019-12-12 NOTE — Patient Instructions (Signed)
We will get a mobility evaluation set up for the motorized wheelchair.  [facility] should be contacting you.  Call me if they don't.    The physicians and staff at [facility] are committed to providing excellent care. You may receive a survey requesting feedback about your experience at our office. We strive to receive "very good" responses to the survey questions. If you feel that your experience would prevent you from giving the office a "very good " response, please contact our office to try to remedy the situation. We may be reached at [phone]. Thank you for taking the time out of your busy day to complete the survey.

## 2019-12-15 DIAGNOSIS — N3941 Urge incontinence: Secondary | ICD-10-CM | POA: Diagnosis not present

## 2019-12-15 DIAGNOSIS — N311 Reflex neuropathic bladder, not elsewhere classified: Secondary | ICD-10-CM | POA: Diagnosis not present

## 2019-12-16 ENCOUNTER — Ambulatory Visit: Payer: PPO

## 2019-12-16 ENCOUNTER — Other Ambulatory Visit: Payer: Self-pay

## 2019-12-16 DIAGNOSIS — I890 Lymphedema, not elsewhere classified: Secondary | ICD-10-CM

## 2019-12-16 DIAGNOSIS — M6281 Muscle weakness (generalized): Secondary | ICD-10-CM

## 2019-12-16 DIAGNOSIS — R2689 Other abnormalities of gait and mobility: Secondary | ICD-10-CM | POA: Diagnosis not present

## 2019-12-16 DIAGNOSIS — R2681 Unsteadiness on feet: Secondary | ICD-10-CM

## 2019-12-16 NOTE — Therapy (Signed)
[place], [place], [address] Phone: [phone]   Fax:  [phone]  Physical Therapy Treatment  Patient Details  Name: Nicholas [name] MRN: [id number] Date of Birth: 1946/02/08 Referring Provider (PT): Dr. [name]   Encounter Date: 12/16/2019  PT End of Session - 12/16/19 1430    Visit Number  67    Number of Visits  63    Date for PT Re-Evaluation  12/15/19    Authorization Type  [employer] (Medicare)-will need 10th visit progress note    PT Start Time  1405    PT Stop Time  1500    PT Time Calculation (min)  55 min    Activity Tolerance  Patient tolerated treatment well    Behavior During Therapy  [facility] for tasks assessed/performed       Past Medical History:  Diagnosis Date  . Cancer [facility]) Jan 2008   skin; hx of melanoma left foot/ amputation of toes 1&2   . Hyperlipidemia   . Hypertension   . Lymphedema     Past Surgical History:  Procedure Laterality Date  . 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 removal of melanoma of left foot/amputation of toes 1&25 Jul 2006  . WRIST FRACTURE SURGERY     74 years old-set    There were no vitals filed for this visit.  Subjective Assessment - 12/16/19 1431    Subjective  Pt states that he has fallen out of his wheel chair over the weekend and had to get the fire department to help him up. He states that he did not get hurt that he can tell. Pt states that he has not been wearing compression but he has found someone who may be able to provide home health. He states that he wore his tribute last night but that was the last time he had compression on his legs. He states that he is concerned he is getting swelling in his testicles.    Pertinent History  Lymphedema-has compression machine and uses stockings (wife helps); hx of PD/Parkinson's Plus Syndrone; malignant melanoma, hyperlipidemia, HTN, insomnia, obesity, hyperglycemia, dizziness, OSA,  urinary urgency    Patient Stated Goals  get back in stockings and to put his his shoes on    Currently in Pain?  No/denies    Multiple Pain Sites  No                   Outpatient [facility]  Lymphedema Life Impact Scale Total Score  14.71 %           [facility] Adult PT Treatment/Exercise - 12/16/19 0001      Manual Therapy   Manual Therapy  Manual Lymphatic Drainage (MLD);Edema management    Edema Management  Pt was assisted with donning double edemawear on bil legs from foot to thigh 1x ABD pad on bil lower legs due to pt has not been wearing compression and small areas have open and are weeping.     Manual Lymphatic Drainage (MLD)  In supine: short neck, swimming in the terminus, bil shoulder collectors, bil axillary and inguinal nodes, bil axillo-inguinal anastomosis, LE one at a time lateral thigh, medial to lateral thigh, lateral thigh, posterior knee, all surfaces of the lower leg, ankle, dorsum of the foot, re-worked all surfaces 3x then deep abdominals             PT Education -  12/16/19 1507    Education Details  Pt will continue to use his trunk piece over the weekend. Educated pt on use of compression shorts and swell spot for testicular swelling. Pt will consider this and will be provided with information on his next visit.    Person(s) Educated  Patient    Methods  Explanation    Comprehension  Verbalized understanding       PT Short Term Goals - 11/10/19 1642      PT SHORT TERM GOAL #1   Title  Pt will be independent with HEP for improved balance, transfers and gait.  Updated TARGET 03/01/18    Baseline  pt is working on exercises and using his pump daily    Status  Achieved        PT Long Term Goals - 11/10/19 1643      PT LONG TERM GOAL #1   Title  Pt will decrease lymphedema volume in bilateral LEs to allow pt and wife to donn stockings at home for continued self care    Baseline   pt and spouse continue with difficulty with garments    Time  5    Period  Weeks    Status  New    Target Date  12/15/19            Plan - 12/16/19 1430    Clinical Impression Statement  Pt presents to physical therapy with increased edema in his BIl lower legs due to he has not been to physical therapy for a week and a half. He has open areas that are weeping on bil lower extremities. ABD pads were placed over bil lower legs; no signs symptoms of infection. MLD was performed with legs elevated and then edema wear was donned with legs elevated. Discussed compression for the testicles due to pt reporting that he has noticed increased fluid in this area. Pt will benefit from continued POC at this tme.    Personal Factors and Comorbidities  Age;Fitness;Comorbidity 2    Comorbidities  limited mobility, lymph node removal    Rehab Potential  Fair    Clinical Impairments Affecting Rehab Potential  history of parkinsons disease with limitations in ablilty to put on compression garments, see if pt likes Farrow wrap on LLE and if he wants to order for the R.    PT Frequency  2x / week    PT Duration  4 weeks    PT Treatment/Interventions  ADLs/Self Care Home Management;Therapeutic activities;Therapeutic exercise;Patient/family education;Orthotic Fit/Training;Manual techniques;Vasopneumatic Device;Compression bandaging;Manual lymph drainage;Functional mobility training;DME Instruction    PT Next Visit Plan  continue CDT of bil LEs until ind with velcro garments and pump, ask about shorts or edema wear.    Consulted and Agree with Plan of Care  Patient       Patient will benefit from skilled therapeutic intervention in order to improve the following deficits and impairments:  Decreased skin integrity, Postural dysfunction, Obesity, Increased edema, Difficulty walking, Decreased mobility  Visit Diagnosis: Other abnormalities of gait and mobility  Lymphedema, not elsewhere  classified  Unsteadiness on feet  Muscle weakness (generalized)     Problem List Patient Active Problem List   Diagnosis Date Noted  . Strain of right biceps 09/12/2017  . Lymphedema 02/10/2017  . BPH associated with nocturia 05/16/2016  . Senile purpura ([place]) 05/03/2016  . OSA (obstructive sleep apnea) 12/28/2015  . Hypersomnia 11/15/2015  . Cough 11/15/2015  . 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 Dizziness  12/30/2014  . Diastolic dysfunction 123456  . Former smoker 04/29/2014  . Chronic venous insufficiency 02/20/2014  . Hyperglycemia 08/02/2012  . Morbid obesity ([place]) 07/13/2010  . History of malignant melanoma. left leg 03/09/2009  . Insomnia 10/03/2007  . Hyperlipidemia 12/13/2006  . Essential hypertension 12/13/2006    Ander Purpura, PT 12/16/2019, 3:33 PM  [place], [place], [address] Phone: [phone]   Fax:  [phone]  Name: Nicholas [name] MRN: [id number] Date of Birth: 06-Jan-1946

## 2019-12-23 ENCOUNTER — Other Ambulatory Visit: Payer: Self-pay

## 2019-12-23 ENCOUNTER — Ambulatory Visit: Payer: PPO | Attending: Family Medicine

## 2019-12-23 DIAGNOSIS — R2681 Unsteadiness on feet: Secondary | ICD-10-CM | POA: Diagnosis not present

## 2019-12-23 DIAGNOSIS — M6281 Muscle weakness (generalized): Secondary | ICD-10-CM | POA: Diagnosis not present

## 2019-12-23 DIAGNOSIS — I890 Lymphedema, not elsewhere classified: Secondary | ICD-10-CM | POA: Insufficient documentation

## 2019-12-23 DIAGNOSIS — R2689 Other abnormalities of gait and mobility: Secondary | ICD-10-CM | POA: Insufficient documentation

## 2019-12-23 NOTE — Therapy (Signed)
[place], [place], [address] Phone: [phone]   Fax:  [phone]  Physical Therapy Treatment  Patient Details  Name: Nicholas [name] MRN: [id number] Date of Birth: Mar 15, 1946 Referring Provider (PT): Dr. [name]   Encounter Date: 12/23/2019  PT End of Session - 12/23/19 1734    Visit Number  45    Number of Visits  32    Date for PT Re-Evaluation  12/15/19    Authorization Type  [employer] (Medicare)-will need 10th visit progress note    PT Start Time  1625    PT Stop Time  1710    PT Time Calculation (min)  45 min    Activity Tolerance  Patient tolerated treatment well    Behavior During Therapy  [facility] for tasks assessed/performed       Past Medical History:  Diagnosis Date   Cancer [facility]) Jan 2008   skin; hx of melanoma left foot/ amputation of toes 1&2    Hyperlipidemia    Hypertension    Lymphedema     Past Surgical History:  Procedure Laterality Date   4th toe- 2nd primary melanoma  2009   removal of melanoma of left foot/amputation of toes 1&25 Jul 2006   WRIST FRACTURE SURGERY     74 years old-set    There were no vitals filed for this visit.  Subjective Assessment - 12/23/19 1737    Subjective  Pt states that he hasn't been wearing his compression for 1 week. He has been using the pump.    Pertinent History  Lymphedema-has compression machine and uses stockings (wife helps); hx of PD/Parkinson's Plus Syndrone; malignant melanoma, hyperlipidemia, HTN, insomnia, obesity, hyperglycemia, dizziness, OSA, urinary urgency    Patient Stated Goals  get back in stockings and to put his his shoes on    Currently in Pain?  No/denies    Multiple Pain Sites  No                   Outpatient Re[facility]  Lymphedema Life Impact Scale Total Score  14.71 %           [facility] Adult PT Treatment/Exercise -  12/23/19 0001      Manual Therapy   Manual Therapy  Manual Lymphatic Drainage (MLD);Edema management    Manual therapy comments  Pt lower legs were dressed with ABD pads and ALlevyn prior to MLD and donning edema wear.     Edema Management  Pt was assisted with donning double edemawear on bil legs from foot to thigh 1x ABD pad on bil lower legs due to pt has not been wearing compression and small areas have open and are weeping.     Manual Lymphatic Drainage (MLD)  In supine: short neck, swimming in the terminus, bil shoulder collectors, bil axillary and inguinal nodes, bil axillo-inguinal anastomosis, LE one at a time lateral thigh, medial to lateral thigh, lateral thigh, posterior knee, re-worked all surfaces deep abdominals             PT Education - 12/23/19 1735    Education Details  Pt will continue to use his trunk piece over the week. He will continue to look for home health help to don compression garments and will notify MD of redness/warmth around open area in the R lower leg.    Person(s) Educated  Patient;Spouse    Methods  Explanation    Comprehension  Verbalized understanding       PT Short Term Goals - 11/10/19 1642      PT SHORT TERM GOAL #1   Title  Pt will be independent with HEP for improved balance, transfers and gait.  Updated TARGET 03/01/18    Baseline  pt is working on exercises and using his pump daily    Status  Achieved        PT Long Term Goals - 11/10/19 1643      PT LONG TERM GOAL #1   Title  Pt will decrease lymphedema volume in bilateral LEs to allow pt and wife to donn stockings at home for continued self care    Baseline  pt and spouse continue with difficulty with garments    Time  5    Period  Weeks    Status  New    Target Date  12/15/19            Plan - 12/23/19 1731    Clinical Impression Statement  Pt presents to physical therapy today with weeping in both legs after missing one week of physical therapy. He has a raised area  with rubor and warmth that is weeping clear serous fluid without an odor; pt and spouse were educated to see MD for possible infection in the R lower leg. Small dime size deflated blister on the medial proximal L lower leg wtih no signs/symptoms of infection. He is weeping out of small areas on the lateral L lower leg. Weeping areas were covered with ABD pad and open area as well as deflated blister were covered with pieces of allevyn for cushioning. MLD was performed only to the knee and did not cover the lower legs due to possible infection this session while legs were elevated. Edema wear was placed on bil lower legs doubled up. Pt continues with more definition in bil thighs and softening of the abdomen. He states that the fluid is improving in his testicles and is not concerned at the time. Pt will benefit from current POC at this time.    Personal Factors and Comorbidities  Age;Fitness;Comorbidity 2    Comorbidities  limited mobility, lymph node removal    Rehab Potential  Fair    Clinical Impairments Affecting Rehab Potential  history of parkinsons disease with limitations in ablilty to put on compression garments, see if pt likes Farrow wrap on LLE and if he wants to order for the R.    PT Frequency  2x / week    PT Duration  4 weeks    PT Treatment/Interventions  ADLs/Self Care Home Management;Therapeutic activities;Therapeutic exercise;Patient/family education;Orthotic Fit/Training;Manual techniques;Vasopneumatic Device;Compression bandaging;Manual lymph drainage;Functional mobility training;DME Instruction    PT Next Visit Plan  continue CDT of bil LEs until ind with velcro garments and pump, ask about shorts or edema wear.    Consulted and Agree with Plan of Care  Patient    Family Member Consulted  wife       Patient will benefit from skilled therapeutic intervention in order to improve the following deficits and impairments:  Decreased skin integrity, Postural dysfunction, Obesity,  Increased edema, Difficulty walking, Decreased mobility  Visit Diagnosis: Other abnormalities of gait and mobility  Lymphedema, not elsewhere classified  Unsteadiness on feet  Muscle weakness (generalized)     Problem List Patient Active Problem List   Diagnosis Date Noted   Strain of right biceps 09/12/2017   Lymphedema 02/10/2017   BPH associated with nocturia 05/16/2016   Senile  purpura ([place]) 05/03/2016   OSA (obstructive sleep apnea) 12/28/2015   Hypersomnia 11/15/2015   Cough 11/15/2015   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 left leg 03/09/2009   Insomnia 10/03/2007   Hyperlipidemia 12/13/2006   Essential hypertension 12/13/2006    Ander Purpura, PT 12/23/2019, 5:38 PM  [place], [place], [address] Phone: [phone]   Fax:  [phone]  Name: RAJVIR BYRDSONG MRN: [id number] Date of Birth: [birth year]

## 2019-12-24 DIAGNOSIS — N3000 Acute cystitis without hematuria: Secondary | ICD-10-CM | POA: Diagnosis not present

## 2019-12-25 ENCOUNTER — Other Ambulatory Visit: Payer: Self-pay

## 2019-12-25 ENCOUNTER — Ambulatory Visit: Payer: PPO

## 2019-12-25 DIAGNOSIS — M6281 Muscle weakness (generalized): Secondary | ICD-10-CM

## 2019-12-25 DIAGNOSIS — I890 Lymphedema, not elsewhere classified: Secondary | ICD-10-CM

## 2019-12-25 DIAGNOSIS — R2689 Other abnormalities of gait and mobility: Secondary | ICD-10-CM | POA: Diagnosis not present

## 2019-12-25 DIAGNOSIS — R2681 Unsteadiness on feet: Secondary | ICD-10-CM

## 2019-12-25 NOTE — Therapy (Addendum)
[place], [place], [address] Phone: [phone]   Fax:  [phone]  Physical Therapy Treatment  Progress Note Reporting Period 11/10/19 to 12/25/19  See note below for Objective Data and Assessment of Progress/Goals.      Patient Details  Name: Nicholas [name] MRN: [id number] Date of Birth: [birth year] Referring Provider (PT): Dr. [name]   Encounter Date: 12/25/2019  PT End of Session - 12/25/19 1607    Visit Number  63    Number of Visits  81    Date for PT Re-Evaluation  01/15/20    Authorization Type  [employer] (Medicare)-will need 10th visit progress note    PT Start Time  1615    PT Stop Time  1655    PT Time Calculation (min)  40 min    Activity Tolerance  Patient tolerated treatment well    Behavior During Therapy  [facility] for tasks assessed/performed       Past Medical History:  Diagnosis Date  . 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 Hyperlipidemia   . Hypertension   . Lymphedema     Past Surgical History:  Procedure Laterality Date  . 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 removal of melanoma of left foot/amputation of toes 1&25 Jul 2006  . WRIST FRACTURE SURGERY     74 years old-set    There were no vitals filed for this visit.  Subjective Assessment - 12/25/19 1608    Subjective  Pt states that his legs are weeping much worse than they were last session.    Pertinent History  Lymphedema-has compression machine and uses stockings (wife helps); hx of PD/Parkinson's Plus Syndrone; malignant melanoma, hyperlipidemia, HTN, insomnia, obesity, hyperglycemia, dizziness, OSA, urinary urgency    Patient Stated Goals  get back in stockings and to put his his shoes on    Currently in Pain?  No/denies                   Outpatient Re[facility] from [facility]/02/2018 [facility]  Lymphedema Life  Impact Scale Total Score  14.71 %           [facility] Adult PT Treatment/Exercise - 12/25/19 0001      Manual Therapy   Manual Therapy  Edema management    Manual therapy comments  pt lower legs were covered with Allevyn on the mid R lower lateral leg and ABD pads were on the bil lateral legs and medial L leg over area that had blistered and pt popped.Secured in place with elastomull    Edema Management  Pt was assisted with single layer of edema wear from foot to thigh and then double edema wear from foot to knee on bil LE due ot pt reports that doubled edema wear at thigh is bothersome.              PT Education - 12/25/19 1714    Education Details  Pt and spouse will contact the wound center. Dr. Yong Channel and Wound center were contacted to get Nicholas [name] a referral due to red raised area on the R lateral lower leg has not improved.    Person(s) Educated  Patient;Spouse    Methods  Explanation    Comprehension  Verbalized understanding       PT Short Term Goals - 11/10/19 1642  PT SHORT TERM GOAL #1   Title  Pt will be independent with HEP for improved balance, transfers and gait.  Updated TARGET 03/01/18    Baseline  pt is working on exercises and using his pump daily    Status  Achieved        PT Long Term Goals - 11/10/19 1643      PT LONG TERM GOAL #1   Title  Pt will decrease lymphedema volume in bilateral LEs to allow pt and wife to donn stockings at home for continued self care    Baseline  pt and spouse continue with difficulty with garments    Time  5    Period  Weeks    Status  New    Target Date  12/15/19            Plan - 12/25/19 1607    Clinical Impression Statement  Discussed POC with pt. He has the equipment that he needs at home to manage his swelling he just needs home care to help manage due to mobility deficits. Pt has raised red area on the R lateral lower leg that has not improved since his last session; this was covered along with other  weeping areas due to open areas on patients legs that appeared after missing one week of physical therapy and no compression. He was referred to the wound clinic at this time. Pt will be on hold from physical therapy and go to the wound clinic. He currently should be able to manage lymphedema at home and has been referred to home health but this has not been arranged yet. Pt continues with edematous legs and will take consistent care at management at home to reduce which is possible if pt had the help to utilize compression and vasopneumatic pump. Discussed home plan with spouse today including that pt has the garments and the pump that he needs at home to manage his lymphedema at home but this needs to be put into practice for pateint to make any progress. Pt spouse states that she is trying to get home health and has gotten this verified that it will be covered by insurance. Pt will be on hold at this time until he has finished at the wound clinic.    Personal Factors and Comorbidities  Age;Fitness;Comorbidity 2    Comorbidities  limited mobility, lymph node removal    Stability/Clinical Decision Making  Evolving/Moderate complexity    Rehab Potential  Fair    Clinical Impairments Affecting Rehab Potential  history of parkinsons disease with limitations in ablilty to put on compression garments, see if pt likes Farrow wrap on LLE and if he wants to order for the R.    PT Frequency  --    PT Duration  --    PT Treatment/Interventions  ADLs/Self Care Home Management;Therapeutic activities;Therapeutic exercise;Patient/family education;Orthotic Fit/Training;Manual techniques;Vasopneumatic Device;Compression bandaging;Manual lymph drainage;Functional mobility training;DME Instruction    PT Next Visit Plan  continue CDT of bil LEs until ind with velcro garments and pump, ask about shorts or edema wear.    Consulted and Agree with Plan of Care  Patient       Patient will benefit from skilled therapeutic  intervention in order to improve the following deficits and impairments:  Decreased skin integrity, Postural dysfunction, Obesity, Increased edema, Difficulty walking, Decreased mobility  Visit Diagnosis: Other abnormalities of gait and mobility  Lymphedema, not elsewhere classified  Unsteadiness on feet  Muscle weakness (generalized)  Problem List Patient Active Problem List   Diagnosis Date Noted  . Strain of right biceps 09/12/2017  . Lymphedema 02/10/2017  . BPH associated with nocturia 05/16/2016  . Senile purpura ([place]) 05/03/2016  . OSA (obstructive sleep apnea) 12/28/2015  . Hypersomnia 11/15/2015  . Cough 11/15/2015  . 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 Dizziness 12/30/2014  . Diastolic dysfunction 94/03/8285  . Former smoker 04/29/2014  . Chronic venous insufficiency 02/20/2014  . Hyperglycemia 08/02/2012  . Morbid obesity ([place]) 07/13/2010  . History of malignant melanoma. left leg 03/09/2009  . Insomnia 10/03/2007  . Hyperlipidemia 12/13/2006  . Essential hypertension 12/13/2006   PHYSICAL THERAPY DISCHARGE SUMMARY   Plan:                                                    Patient goals were not met. Patient is being discharged due to a change in medical status.  ?????    Pt was referred to the wound clinic.   Ander Purpura, PT 12/25/2019, [place], [place], [address] Phone: [phone]   Fax:  [phone]  Name: Nicholas [name] MRN: [id number] Date of Birth: 1946/05/03

## 2019-12-26 ENCOUNTER — Other Ambulatory Visit: Payer: Self-pay

## 2019-12-26 DIAGNOSIS — S81809A Unspecified open wound, unspecified lower leg, initial encounter: Secondary | ICD-10-CM

## 2019-12-29 ENCOUNTER — Telehealth: Payer: Self-pay | Admitting: Family Medicine

## 2019-12-29 ENCOUNTER — Other Ambulatory Visit: Payer: Self-pay

## 2019-12-29 DIAGNOSIS — G232 Striatonigral degeneration: Secondary | ICD-10-CM

## 2019-12-29 DIAGNOSIS — R531 Weakness: Secondary | ICD-10-CM

## 2019-12-29 NOTE — Telephone Encounter (Signed)
Patient's wife states that she has talked with Dr.Hunter about needing some assistance with her husband, she states she has talked with her insurance company and husbands physical therapist, they advise patient needs help 3 days out of the week 3 hours to help with dressing, showering and helping with the compression machine. Insurance states that if someone calls them they can go ahead and get this process started for them.

## 2019-12-29 NOTE — Telephone Encounter (Signed)
Ok to place home health referral?

## 2019-12-29 NOTE — Telephone Encounter (Signed)
Please schedule pt for OV for home health in order for Dr. Yong Channel to be able to sign off for insurance.

## 2019-12-29 NOTE — Telephone Encounter (Signed)
Yes thanks im ok with referral under parkinsons and generalized weakness. He will need a face to face visit for me to be able to formally sign off on this per insurance- I think has to be within 30 days of start of therapy so he needs to keep that in mind.

## 2019-12-29 NOTE — Telephone Encounter (Signed)
LVM for patient to call us back to get scheduled with an appointment.

## 2020-01-01 ENCOUNTER — Ambulatory Visit (INDEPENDENT_AMBULATORY_CARE_PROVIDER_SITE_OTHER): Payer: PPO | Admitting: Family Medicine

## 2020-01-01 ENCOUNTER — Other Ambulatory Visit: Payer: Self-pay

## 2020-01-01 ENCOUNTER — Encounter: Payer: Self-pay | Admitting: Family Medicine

## 2020-01-01 VITALS — BP 110/68 | HR 68 | Temp 98.6°F | Ht 67.0 in | Wt 250.0 lb

## 2020-01-01 DIAGNOSIS — I890 Lymphedema, not elsewhere classified: Secondary | ICD-10-CM | POA: Diagnosis not present

## 2020-01-01 DIAGNOSIS — G232 Striatonigral degeneration: Secondary | ICD-10-CM

## 2020-01-01 DIAGNOSIS — I10 Essential (primary) hypertension: Secondary | ICD-10-CM

## 2020-01-01 DIAGNOSIS — D692 Other nonthrombocytopenic purpura: Secondary | ICD-10-CM | POA: Diagnosis not present

## 2020-01-01 DIAGNOSIS — S81809A Unspecified open wound, unspecified lower leg, initial encounter: Secondary | ICD-10-CM | POA: Diagnosis not present

## 2020-01-01 DIAGNOSIS — E785 Hyperlipidemia, unspecified: Secondary | ICD-10-CM | POA: Diagnosis not present

## 2020-01-01 DIAGNOSIS — R531 Weakness: Secondary | ICD-10-CM | POA: Diagnosis not present

## 2020-01-01 NOTE — Patient Instructions (Addendum)
No changes today. Referral made- let me know if any issues with home health aide needs

## 2020-01-01 NOTE — Progress Notes (Signed)
Phone [phone] In person visit   Subjective:   Nicholas [name] is a 74 y.o. year old very pleasant male patient who presents for/with See problem oriented charting Chief Complaint  Patient presents with  . Follow-up  . home health    This visit occurred during the SARS-CoV-2 public health emergency.  Safety protocols were in place, including screening questions prior to the visit, additional usage of staff PPE, and extensive cleaning of exam room while observing appropriate contact time as indicated for disinfecting solutions.   Past Medical History-  Patient Active Problem List   Diagnosis Date Noted  . Lymphedema 02/10/2017    Priority: High  . 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 History of malignant melanoma. left leg 03/09/2009    Priority: High  . BPH associated with nocturia 05/16/2016    Priority: Medium  . OSA (obstructive sleep apnea) 12/28/2015    Priority: Medium  . Diastolic dysfunction 27/09/5007    Priority: Medium  . Chronic venous insufficiency 02/20/2014    Priority: Medium  . Hyperglycemia 08/02/2012    Priority: Medium  . Hyperlipidemia 12/13/2006    Priority: Medium  . Essential hypertension 12/13/2006    Priority: Medium  . Senile purpura ([place]) 05/03/2016    Priority: Low  . Hypersomnia 11/15/2015    Priority: Low  . Former smoker 04/29/2014    Priority: Low  . Morbid obesity ([place]) 07/13/2010    Priority: Low  . Insomnia 10/03/2007    Priority: Low  . Strain of right biceps 09/12/2017  . Cough 11/15/2015  . Dizziness 12/30/2014    Medications- reviewed and updated Current Outpatient Medications  Medication Sig Dispense Refill  . carbidopa-levodopa (SINEMET IR) 25-100 MG tablet TAKE 2 TABLETS AT 7AM,11AM, 3PM, AND AT 7PM. 720 tablet 0  . rosuvastatin (CRESTOR) 10 MG tablet Take 1 tablet (10 mg total) by mouth daily. 90 tablet 3   No current facility-administered medications for this visit.       Objective:  BP 110/68   Pulse 68   Temp 98.6 F (37 C) (Temporal)   Ht 5\' 7"  (1.702 m)   Wt 250 lb (113.4 kg)   SpO2 95%   BMI 39.16 kg/m  Gen: NAD, resting comfortably CV: RRR no murmurs rubs or gallops Lungs: CTAB no crackles, wheeze, rhonchi Abdomen: soft/nontender/nondistended/normal bowel sounds. Ext: 4+ edema on the left leg-3-4+ on the right     Assessment and Plan  # Parkinson's/generalized weakness/lymphedema S: Patient with history of malignant melanoma left leg-ongoing issues with lymphedema after that..  He has had recent worsening of these issues despite working with physical therapy.  His wife is physically unable to get compression stockings on and is having a very difficult time with setting him up for compression machine.  Patient has generalized weakness and due to Parkinson's and mobility issues-is unable to get his compression stockings on without assist  Patient can dress and bathe himself but seems to completely wear himself out with this as it takes several hours to do these-would be very helpful to have support for these and wife no longer feels safe with some of these activities.  He has had a fall in the past and she had to call 911 as he was unable to get him up  Family is inquiring about potentially a home health aide coming in to help with dressing, showering, helping with compression machine, helping with compression stockings.  A/P: Patient with probably the most  significant lymphedema I have seen-I would call this 4+ edema worse on the left.  He is working hard with physical therapy but needs home therapy as well with regular use of compression machine and compression stockings to try to make more significant progress.  I also think he could benefit from getting on his recumbent bike but would need assistance with that.  Also needs help with bathing/dressing.  Home health referral for home health aide was placed today-I do not think these are skilled needs  but could consider PT or OT to see if there are ways to help with needed activities at home.  Patient declines physical therapy and Occupational Therapy portion for now.  Also has some wounds on his legs and is going to be seeing wound care soon-he does not want home health nurse at this time for wound care  Patient reports he will be getting an electronic wheelchair set up next week-I do think that would be helpful  #hyperlipidemia S: Medication: Rosuvastatin 10 mg Lab Results  Component Value Date   CHOL 105 10/03/2019   HDL 38 (L) 10/03/2019   LDLCALC 45 10/03/2019   LDLDIRECT 50.0 05/03/2016   TRIG 135 10/03/2019   CHOLHDL 2.8 10/03/2019   A/P: Well-controlled-continue current medication  #He reports he is on  antibiotic for UTI and another medication for BPH though he did not know the name today  #Morbid obesity-BMI over 35 with hypertension though now controlled without medication and hyperlipidemia-hoping can slightly reduce this with getting him back on the bike-I think getting some of the fluid off his legs will also help with weight.  Weight actually down 5 pounds from last visit which is encouraging   #Senile purpura-reports easy bruising.  CBC without thrombocytopenia reassuring a few months ago-continue to monitor  #Hypertension-remains controlled without medication.  Blood pressure improved since diagnosis of Parkinson's.  Continue to monitor without meds  Recommended follow up:  Future Appointments  Date Time Provider [place]  01/08/2020  9:00 AM Ricard Dillon, MD [facility]  07/13/2020  2:30 PM [name], [name], DO [facility] None    Lab/Order associations:   ICD-10-CM   1. Lymphedema  I89.0   2. Hyperlipidemia, unspecified hyperlipidemia type  E78.5   3. Essential hypertension  I10   4. 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 General weakness  R53.1 Ambulatory referral to [place]  6. Open wound of lower  extremity, unspecified laterality, initial encounter  S81.809A Ambulatory referral to [place]  7. Morbid obesity ([name]) Chronic E66.01   8. Senile purpura ([place]) Chronic D69.2    Return precautions advised.  [name], MD

## 2020-01-06 DIAGNOSIS — G20 Parkinson's disease: Secondary | ICD-10-CM | POA: Diagnosis not present

## 2020-01-06 DIAGNOSIS — I890 Lymphedema, not elsewhere classified: Secondary | ICD-10-CM | POA: Diagnosis not present

## 2020-01-08 ENCOUNTER — Encounter (HOSPITAL_BASED_OUTPATIENT_CLINIC_OR_DEPARTMENT_OTHER): Payer: PPO | Attending: Internal Medicine | Admitting: Internal Medicine

## 2020-01-08 ENCOUNTER — Other Ambulatory Visit: Payer: Self-pay

## 2020-01-08 DIAGNOSIS — I890 Lymphedema, not elsewhere classified: Secondary | ICD-10-CM | POA: Diagnosis not present

## 2020-01-08 DIAGNOSIS — Z6839 Body mass index (BMI) 39.0-39.9, adult: Secondary | ICD-10-CM | POA: Diagnosis not present

## 2020-01-08 DIAGNOSIS — N401 Enlarged prostate with lower urinary tract symptoms: Secondary | ICD-10-CM | POA: Insufficient documentation

## 2020-01-08 DIAGNOSIS — E669 Obesity, unspecified: Secondary | ICD-10-CM | POA: Diagnosis not present

## 2020-01-08 DIAGNOSIS — Z87891 Personal history of nicotine dependence: Secondary | ICD-10-CM | POA: Diagnosis not present

## 2020-01-08 DIAGNOSIS — L97811 Non-pressure chronic ulcer of other part of right lower leg limited to breakdown of skin: Secondary | ICD-10-CM | POA: Diagnosis not present

## 2020-01-08 DIAGNOSIS — Z880 Allergy status to penicillin: Secondary | ICD-10-CM | POA: Diagnosis not present

## 2020-01-08 DIAGNOSIS — G20 Parkinson's disease: Secondary | ICD-10-CM | POA: Diagnosis not present

## 2020-01-08 DIAGNOSIS — E785 Hyperlipidemia, unspecified: Secondary | ICD-10-CM | POA: Insufficient documentation

## 2020-01-08 DIAGNOSIS — Z85820 Personal history of malignant melanoma of skin: Secondary | ICD-10-CM | POA: Diagnosis not present

## 2020-01-08 DIAGNOSIS — G4733 Obstructive sleep apnea (adult) (pediatric): Secondary | ICD-10-CM | POA: Diagnosis not present

## 2020-01-08 DIAGNOSIS — L97821 Non-pressure chronic ulcer of other part of left lower leg limited to breakdown of skin: Secondary | ICD-10-CM | POA: Diagnosis not present

## 2020-01-08 DIAGNOSIS — I5032 Chronic diastolic (congestive) heart failure: Secondary | ICD-10-CM | POA: Insufficient documentation

## 2020-01-08 DIAGNOSIS — I110 Hypertensive heart disease with heart failure: Secondary | ICD-10-CM | POA: Diagnosis not present

## 2020-01-08 NOTE — Progress Notes (Signed)
Nicholas [name] ([id number]) , Visit Report for 01/08/2020 Abuse/Suicide Risk Screen Details Patient Name: Date of Service: Nicholas [name] DFO RD K. 01/08/2020 9:00 A M Medical Record Number: [id number] Patient Account Number: [id number] Date of Birth/Sex: Treating RN: 02-11-1946 (74 y.o. Nicholas [name] Primary Care Nicholas [name]: Nicholas [name] Other Clinician: Referring Nicholas [name]: Treating Nicholas [name]/Extender: Nicholas [name] in Treatment: 0 Abuse/Suicide Risk Screen Items Answer ABUSE RISK SCREEN: Has anyone close to you tried to hurt or harm you recentlyo No Do you feel uncomfortable with anyone in your familyo No Has anyone forced you do things that you didnt want to doo No Electronic Signature(s) Signed: 01/08/2020 5:22:52 PM By: Baruch Gouty RN, BSN Entered By: Baruch Gouty on 01/08/2020 09:13:18 -------------------------------------------------------------------------------- Activities of Daily Living Details Patient Name: Date of Service: Nicholas [name] DFO RD K. 01/08/2020 9:00 A M Medical Record Number: [id number] Patient Account Number: [id number] Date of Birth/Sex: Treating RN: 13-Apr-1946 (74 y.o. Nicholas [name] Primary Care Nicholas [name]: Nicholas [name] Other Clinician: Referring Nicholas [name]: Treating Nicholas [name]/Extender: Nicholas [name] in Treatment: 0 Activities of Daily Living Items Answer Activities of Daily Living (Please select one for each item) Drive Automobile Not Able T Medications ake Completely Able Use T elephone Completely Able Care for Appearance Completely Able Use T oilet Completely Able Bath / Shower Completely Able Dress Self Completely Able Feed Self Completely Able Walk Need Assistance Get In / Out Bed Completely Able Housework Need Assistance Prepare Meals Need Assistance Handle Money Completely Able Shop for Self Need Assistance Electronic Signature(s) Signed: 01/08/2020 5:22:52 PM By: Baruch Gouty RN, BSN Entered By: Baruch Gouty on 01/08/2020 09:14:16 -------------------------------------------------------------------------------- Education Screening Details Patient Name: Date of Service: Nicholas [name] DFO RD K. 01/08/2020 9:00 A M Medical Record Number: [id number] Patient Account Number: [id number] Date of Birth/Sex: Treating RN: August 13, 1945 (74 y.o. Nicholas [name] Primary Care Londen [name]: Nicholas [name] Other Clinician: Referring Nicholas [name]: Treating Nicholas [name]/Extender: Nicholas [name] in Treatment: 0 Primary Learner Assessed: Patient Learning Preferences/Education Level/Primary Language Learning Preference: Explanation, Demonstration, Printed Material Highest Education Level: College or Above Preferred Language: English Cognitive Barrier Language Barrier: No Translator Needed: No Memory Deficit: No Emotional Barrier: No Cultural/Religious Beliefs Affecting Medical Care: No Physical Barrier Impaired Vision: Yes Glasses Impaired Hearing: No Decreased Hand dexterity: No Knowledge/Comprehension Knowledge Level: High Comprehension Level: High Ability to understand written instructions: High Ability to understand verbal instructions: High Motivation Anxiety Level: Calm Cooperation: Cooperative Education Importance: Acknowledges Need Interest in Health Problems: Asks Questions Perception: Coherent Willingness to Engage in Self-Management High Activities: Readiness to Engage in Self-Management High Activities: Electronic Signature(s) Signed: 01/08/2020 5:22:52 PM By: Baruch Gouty RN, BSN Entered By: Baruch Gouty on 01/08/2020 09:14:56 -------------------------------------------------------------------------------- Fall Risk Assessment Details Patient Name: Date of Service: Nicholas [name] DFO RD K. 01/08/2020 9:00 A M Medical Record Number: [id number] Patient Account Number: [id number] Date of Birth/Sex: Treating RN: [birth year] (74  y.o. Nicholas [name] Primary Care Nicholas [name]: Nicholas [name] Other Clinician: Referring Nicholas [name]: Treating Nicholas [name]/Extender: Nicholas [name] in Treatment: 0 Fall Risk Assessment Items Have you had 2 or more falls in the last 12 monthso 0 No Have you had any fall that resulted in injury in the last 12 monthso 0 No FALLS RISK SCREEN History of falling - immediate or within 3 months 25 Yes Secondary diagnosis (Do you have 2 or more medical diagnoseso) 0 No Ambulatory aid None/bed rest/wheelchair/nurse 0 No Crutches/cane/walker 15 Yes Furniture  0 No Intravenous therapy Access/Saline/Heparin Lock 0 No Gait/Transferring Normal/ bed rest/ wheelchair 0 No Weak (short steps with or without shuffle, stooped but able to lift head while walking, may seek 10 Yes support from furniture) Impaired (short steps with shuffle, may have difficulty arising from chair, head down, impaired 0 No balance) Mental Status Oriented to own ability 0 Yes Electronic Signature(s) Signed: 01/08/2020 5:22:52 PM By: Baruch Gouty RN, BSN Entered By: Baruch Gouty on 01/08/2020 09:16:02 -------------------------------------------------------------------------------- Foot Assessment Details Patient Name: Date of Service: Nicholas [name] DFO RD K. 01/08/2020 9:00 A M Medical Record Number: [id number] Patient Account Number: [id number] Date of Birth/Sex: Treating RN: 1945-12-08 (74 y.o. Nicholas [name] Primary Care Nicholas [name]: Nicholas [name] Other Clinician: Referring Nicholas [name]: Treating Nicholas [name]/Extender: Nicholas [name] in Treatment: 0 Foot Assessment Items Site Locations + = Sensation present, - = Sensation absent, C = Callus, U = Ulcer R = Redness, W = Warmth, M = Maceration, PU = Pre-ulcerative lesion F = Fissure, S = Swelling, D = Dryness Assessment Right: Left: Other Deformity: No No Prior Foot Ulcer: No Yes Prior Amputation: No Yes Charcot Joint: No  No Ambulatory Status: Ambulatory With Help Assistance Device: Walker Gait: Steady Electronic Signature(s) Signed: 01/08/2020 5:22:52 PM By: Baruch Gouty RN, BSN Entered By: Baruch Gouty on 01/08/2020 09:19:28 -------------------------------------------------------------------------------- Nutrition Risk Screening Details Patient Name: Date of Service: Nicholas [name] DFO RD K. 01/08/2020 9:00 A M Medical Record Number: [id number] Patient Account Number: [id number] Date of Birth/Sex: Treating RN: [birth year] (74 y.o. Nicholas [name] Primary Care Dejanee Thibeaux: Nicholas [name] Other Clinician: Referring Diesha Rostad: Treating Deaundre Allston/Extender: Nicholas [name] in Treatment: 0 Height (in): 67 Weight (lbs): 250 Body Mass Index (BMI): 39.2 Nutrition Risk Screening Items Score Screening NUTRITION RISK SCREEN: I have an illness or condition that made me change the kind and/or amount of food I eat 0 No I eat fewer than two meals per day 0 No I eat few fruits and vegetables, or milk products 0 No I have three or more drinks of beer, liquor or wine almost every day 0 No I have tooth or mouth problems that make it hard for me to eat 0 No I don't always have enough money to buy the food I need 0 No I eat alone most of the time 0 No I take three or more different prescribed or over-the-counter drugs a day 0 No Without wanting to, I have lost or gained 10 pounds in the last six months 2 Yes I am not always physically able to shop, cook and/or feed myself 0 No Nutrition Protocols Good Risk Protocol 0 No interventions needed Moderate Risk Protocol High Risk Proctocol Risk Level: Good Risk Score: 2 Electronic Signature(s) Signed: 01/08/2020 5:22:52 PM By: Baruch Gouty RN, BSN Entered By: Baruch Gouty on 01/08/2020 09:16:44

## 2020-01-09 NOTE — Progress Notes (Signed)
Nicholas [name] ([id number]) , Visit Report for 01/08/2020 Chief Complaint Document Details Patient Name: Date of Service: Nicholas [name] DFO RD K. 01/08/2020 9:00 A M Medical Record Number: [id number] Patient Account Number: [id number] Date of Birth/Sex: Treating RN: 27-Feb-1946 (74 y.o. Nicholas [name] Primary Care Provider: [name] Other Clinician: Referring Provider: Treating Provider/Extender: [name] in Treatment: 0 Information Obtained from: Patient Chief Complaint 07/04/2018; patient here for review of bilateral lower leg wounds in the setting of lymphedema 01/08/2020; patient returns to clinic with bilateral lower leg wounds in the setting of lymphedema Electronic Signature(s) Signed: 01/09/2020 7:45:08 AM By: Linton Ham MD Entered By: Linton Ham on 01/08/2020 10:06:08 -------------------------------------------------------------------------------- HPI Details Patient Name: Date of Service: Nicholas [name] DFO RD K. 01/08/2020 9:00 A M Medical Record Number: [id number] Patient Account Number: [id number] Date of Birth/Sex: Treating RN: 21-Feb-1946 (74 y.o. Nicholas [name] Primary Care Provider: [name] Other Clinician: Referring Provider: Treating Provider/Extender: [name] in Treatment: 0 History of Present Illness HPI Description: ADMISSION 07/04/2018 This is a 74 year old man who has bilateral lymphedema felt to be secondary to previous treatment for malignant melanoma on his left toes. 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 Malignant melanoma was treated with immunotherapy. He is not had radiation. Sometime after this he developed edema in the left leg that spread into the right leg. He has bilateral compression pumps at home and he uses them twice a day and claims to be quite compliant. He also has a juxta lite type stocking at home which is apparently  37-year-old. He states that he fell in early October dislocating and fracturing his left shoulder. He slept in a recliner with his legs dependent. He was seen and followed at the rehab clinic at [facility] and was having lymphedema wraps done by 1 of the PT who noted his open wounds last week and he has been referred here for management. The patient has 2 open wounds on the right s distal lower leg and one on the left anterior lower leg The patient is not a diabetic. 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 For one reason or another we could not get him set up with home health and he kept the wraps on all week which he generally seem to tolerate. He did not use his compression pumps over the top of the wraps which I told him he could do this week. He comes in with a new rash on both feet which looks like tinea 07/22/2018; much better edema control bilaterally. The wounds on the right lateral calf and left anterior lower leg both look a lot better. There is epithelialization over both surfaces but I think this is fragile and I am going to put him in our compression wraps for another week. He says he is been using his compression pumps at home. He has a constellation of lower extremity compression garments that he is going to bring in next week. In discussion with him I cannot really get a sense of everything he has. I think he was at one point prescribed 30-40 below-knee stockings  but he points out he could not get them on. 07/29/2018; the patient arrives in his legs are healed. He has a constellation of stockings none of which I think are going  to be that helpful except for the fact that he has external wraparound stockings of some description. He has above-knee stockings but I cannot imagine it would be possible to get these on READMISSION 01/08/2020 Patient is now a 74 year old man. We had him for a short period from December 2019 through January 2020. He has bilateral lower extremity lymphedema which he initially traces to treatment for malignant melanoma 7 years ago. He has been followed at the lymphedema clinic on [facility] and recently has compression pumps that go up to his mid abdomen. He recently developed superficial wounds bilaterally. They will not manage these in the lymphedema clinic where the services are run by therapy. He has not been using his compression pumps. His significant other cannot get the stockings on his legs and the patient is only limited ability to help because of Parkinson's disease Past medical history is essentially unchanged. He has sleep apnea, Parkinson's disease at some point labeled his Parkinson's plus, bilateral lymphedema, history of malignant melanoma. He does not have an arterial issue that I am aware of although his previous arterial studies have been noncompressible Electronic Signature(s) Signed: 01/09/2020 7:45:08 AM By: Linton Ham MD Entered By: Linton Ham on 01/08/2020 10:09:46 -------------------------------------------------------------------------------- Physical Exam Details Patient Name: Date of Service: Nicholas [name] DFO RD K. 01/08/2020 9:00 A M Medical Record Number: [id number] Patient Account Number: [id number] Date of Birth/Sex: Treating RN: [birth year] (74 y.o. Nicholas [name] Primary Care Provider: [name] Other Clinician: Referring Provider: Treating Provider/Extender: [name] in Treatment: 0 Constitutional Sitting or standing Blood Pressure is within target range for patient.. Pulse regular and within target range for  patient.Marland Kitchen Respirations regular, non-labored and within target range.. Temperature is normal and within the target range for the patient.Marland Kitchen Appears in no distress. Respiratory work of breathing is normal. Bilateral breath sounds are clear and equal in all lobes with no wheezes, rales or rhonchi.. Cardiovascular Heart rhythm and rate regular, without murmur or gallop. No signs of CHF. Gastrointestinal (GI) Mildly distended no tenderness no shifting dullness. Psychiatric Somewhat flat affect. Notes Wound exam; the patient has a small superficial area on the right lateral calf, small area on the left anterior and 2 small areas on the left lateral with one distally and one proximally. There is evidence of chronic erythema secondary to venous stasis no evidence of infection 4+ edema in his feet extending up through his calfs there is edema in the posterior thighs. Electronic Signature(s) Signed: 01/09/2020 7:45:08 AM By: Linton Ham MD Entered By: Linton Ham on 01/08/2020 10:12:14 -------------------------------------------------------------------------------- Physician Orders Details Patient Name: Date of Service: Nicholas [name] DFO RD K. 01/08/2020 9:00 A M Medical Record Number: [id number] Patient Account Number: [id number] Date of Birth/Sex: Treating RN: 05/10/1946 (74 y.o. Nicholas [name] Primary Care Provider: [name] Other Clinician: Referring Provider: Treating Provider/Extender: [name] in Treatment: 0 Verbal / Phone Orders: No Diagnosis Coding ICD-10 Coding Code Description I89.0 Lymphedema, not elsewhere classified Follow-up Appointments ppointment in 2 weeks. - Thursday Return A Nurse Visit: - Thursday Dressing Change Frequency Do not change entire dressing for one week. Skin Barriers/Peri-Wound Care TCA Cream or Ointment - mixed with lotion to both legs liberally. Wound Cleansing May shower with protection. - with cast  protectors. Primary Wound Dressing Wound #4 Right,Lateral Lower Leg Calcium Alginate with Silver Wound #5 Left,Medial Lower Leg Calcium Alginate with Silver Wound #6 Left,Lateral Lower Leg Calcium Alginate with Silver Wound #7 Left,Proximal,Lateral Lower Leg Calcium Alginate with Silver Secondary Dressing ABD pad Other: - pad areas to legs in skin folds for protection. Edema Control 4 layer compression - Bilateral Avoid standing for long periods of time Elevate legs to the level of the heart or above for 30 minutes daily and/or when sitting, a frequency of: - throughout the day. Exercise regularly Segmental Compressive Device. - use lymphedema pumps twice a day an hour each time. Once in the morning and once in the evening. Electronic Signature(s) Signed: 01/08/2020 5:45:21 PM By: Deon Pilling Signed: 01/09/2020 7:45:08 AM By: Linton Ham MD Entered By: Deon Pilling on 01/08/2020 09:57:29 -------------------------------------------------------------------------------- Problem List Details Patient Name: Date of Service: Nicholas [name] DFO RD K. 01/08/2020 9:00 A M Medical Record Number: [id number] Patient Account Number: [id number] Date of Birth/Sex: Treating RN: 1946/07/02 (74 y.o. Nicholas [name] Primary Care Provider: [name] Other Clinician: Referring Provider: Treating Provider/Extender: [name] in Treatment: 0 Active Problems ICD-10 Encounter Code Description Active Date MDM Diagnosis I89.0 Lymphedema, not elsewhere classified 01/08/2020 No Yes L97.811 Non-pressure chronic ulcer of other part of right lower leg limited to breakdown 01/08/2020 No Yes of skin L97.821 Non-pressure chronic ulcer of other part of left lower leg limited to breakdown 01/08/2020 No Yes of skin Inactive Problems Resolved Problems Electronic Signature(s) Signed: 01/09/2020 7:45:08 AM By: Linton Ham MD Entered By: Linton Ham on 01/08/2020  10:05:33 -------------------------------------------------------------------------------- Progress Note Details Patient Name: Date of Service: Nicholas [name] DFO RD K. 01/08/2020 9:00 A M Medical Record Number: [id number] Patient Account Number: [id number] Date of Birth/Sex: Treating RN: [birth year] (74 y.o. Nicholas [name] Primary Care Provider: [name] Other Clinician: Referring Provider: Treating Provider/Extender: [name] in Treatment: 0 Subjective Chief Complaint Information obtained from Patient 07/04/2018; patient here for review of bilateral lower leg wounds in the setting of lymphedema 01/08/2020; patient returns to clinic with bilateral lower leg wounds in the setting of lymphedema History of Present Illness (HPI) ADMISSION 07/04/2018 This is a 74 year old man who has bilateral lymphedema felt to be secondary to previous treatment for malignant melanoma on his left toes. 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 Malignant melanoma was treated with immunotherapy. He is not had radiation. Sometime after this he developed edema in the left leg that spread into the right leg. He has bilateral compression pumps at home and he uses them twice a day and claims to be quite compliant. He also has a juxta lite type stocking at home which is apparently 77-year-old. He states that he fell in early October dislocating and fracturing his left shoulder. He slept in a recliner with his legs dependent. He was seen and followed at the rehab clinic at [facility] and was having lymphedema wraps done by 1 of the PT who noted his open wounds last week and he has been referred here for management. The patient has 2 open wounds on the right s distal lower leg and one on the left anterior lower leg The patient is not a diabetic. 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 For one reason or another we could not get him set up with home health and he kept the wraps on all week which he generally seem to tolerate. He did not use his compression pumps over the top of the wraps which I told him he could do this week. He comes in with a new rash on both feet which looks like tinea 07/22/2018; much better edema control bilaterally. The wounds on the right lateral calf and left anterior lower leg both look a lot better. There is epithelialization over both surfaces but I think this is fragile and I am going to put him in our compression wraps for another week. He says he is been using his compression pumps at home. ooHe has a constellation of lower extremity compression garments that he is going to bring in next week. In discussion with him I cannot really get a sense of everything he has. I think he was at one point prescribed 30-40 below-knee stockings but he points out he could not get them on. 07/29/2018; the patient arrives in his legs are healed. He has a constellation of stockings none of which I think are going to be that helpful except for the fact that he has external wraparound stockings of some description. He has above-knee stockings but I cannot imagine it would be possible to get these on READMISSION 01/08/2020 Patient is now a 74 year old man. We had him for a short period from December 2019 through January 2020. He has bilateral lower extremity lymphedema which he initially traces to treatment for malignant melanoma 7 years ago. He has been followed at the lymphedema clinic on [facility] and recently has compression pumps that go up to his mid abdomen. He  recently developed superficial wounds bilaterally. They will not manage these in the lymphedema clinic where the services are run by therapy. He has not been using his compression pumps. His significant other cannot get the stockings on his legs and the patient is only limited ability to help because of Parkinson's disease Past medical history is essentially unchanged. He has sleep apnea, Parkinson's disease at some point labeled his Parkinson's plus, bilateral lymphedema, history of malignant melanoma. He does not have an arterial issue that I am aware of although his previous arterial studies have been noncompressible Patient History Information obtained from Patient. Allergies amoxicillin (Severity: Severe, Reaction: swelling) Family History Kidney Disease - Father, No family history of Cancer, Diabetes, Heart Disease, Hereditary Spherocytosis, Hypertension, Lung Disease, Seizures, Stroke, Thyroid Problems, Tuberculosis. Social History Former smoker - quit 19 years ago, Marital Status - Married, Alcohol Use - Never, Drug Use - No History, Caffeine Use - Rarely. Medical History Eyes Denies history of Cataracts, Glaucoma, Optic Neuritis Ear/Nose/Mouth/Throat Denies history of Chronic sinus problems/congestion, Middle ear problems Hematologic/Lymphatic Patient has history of Lymphedema Denies history of Anemia, Hemophilia, Human Immunodeficiency Virus, Sickle Cell Disease Respiratory Patient has history of Sleep Apnea - obstructive Denies history of Aspiration, Asthma, Chronic Obstructive Pulmonary Disease (COPD), Pneumothorax, Tuberculosis Cardiovascular Patient has history of Congestive Heart Failure - diastolic, Hypertension, Peripheral Venous Disease Denies history of Angina, Arrhythmia, Hypotension, Myocardial Infarction, Peripheral Arterial Disease, Phlebitis, Vasculitis Gastrointestinal Denies history of Cirrhosis , Colitis, Crohnoos, Hepatitis A, Hepatitis B, Hepatitis  C Endocrine Denies history of Type I Diabetes, Type II Diabetes Genitourinary Denies history of End Stage Renal  Disease Immunological Denies history of Lupus Erythematosus, Raynaudoos, Scleroderma Integumentary (Skin) Denies history of History of Burn Musculoskeletal Denies history of Gout, Rheumatoid Arthritis, Osteoarthritis, Osteomyelitis Neurologic Denies history of Dementia, Neuropathy, Quadriplegia, Paraplegia, Seizure Disorder Oncologic Patient has history of Received Chemotherapy Denies history of Received Radiation Psychiatric Denies history of Anorexia/bulimia, Confinement Anxiety Hospitalization/Surgery History - 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. Medical A Surgical History Notes nd Constitutional Symptoms (General Health) obesity Cardiovascular Hyperlidemia Genitourinary BPH Neurologic Parkinson's Oncologic Melanoma in 2007 Review of Systems (ROS) Constitutional Symptoms (General Health) Denies complaints or symptoms of Fatigue, Fever, Chills, Marked Weight Change. Eyes Complains or has symptoms of Glasses / Contacts. Ear/Nose/Mouth/Throat Denies complaints or symptoms of Chronic sinus problems or rhinitis. Respiratory Denies complaints or symptoms of Chronic or frequent coughs, Shortness of Breath. Cardiovascular Denies complaints or symptoms of Chest pain. Gastrointestinal Denies complaints or symptoms of Frequent diarrhea, Nausea, Vomiting. Endocrine Denies complaints or symptoms of Heat/cold intolerance. Genitourinary Complains or has symptoms of Frequent urination. Integumentary (Skin) Complains or has symptoms of Wounds - bil lower legs. Musculoskeletal Complains or has symptoms of Muscle Weakness. Neurologic Complains or has symptoms of Numbness/parasthesias - both feet. Psychiatric Denies complaints or symptoms of Claustrophobia, Suicidal. Objective Constitutional Sitting or standing Blood Pressure is within target range for patient..  Pulse regular and within target range for patient.Marland Kitchen Respirations regular, non-labored and within target range.. Temperature is normal and within the target range for the patient.Marland Kitchen Appears in no distress. Vitals Time Taken: 9:05 AM, Height: 67 in, Source: Stated, Weight: 250 lbs, Source: Stated, BMI: 39.2, Temperature: 98.6 F, Pulse: 84 bpm, Respiratory Rate: 20 breaths/min, Blood Pressure: 122/77 mmHg. Respiratory work of breathing is normal. Bilateral breath sounds are clear and equal in all lobes with no wheezes, rales or rhonchi.. Cardiovascular Heart rhythm and rate regular, without murmur or gallop. No signs of CHF. Gastrointestinal (GI) Mildly distended no tenderness no shifting dullness. Psychiatric Somewhat flat affect. General Notes: Wound exam; the patient has a small superficial area on the right lateral calf, small area on the left anterior and 2 small areas on the left lateral with one distally and one proximally. There is evidence of chronic erythema secondary to venous stasis no evidence of infection 4+ edema in his feet extending up through his calfs there is edema in the posterior thighs. Integumentary (Hair, Skin) Wound #4 status is Open. Original cause of wound was Gradually Appeared. The wound is located on the Right,Lateral Lower Leg. The wound measures 2cm length x 1.5cm width x 0.1cm depth; 2.356cm^2 area and 0.236cm^3 volume. There is Fat Layer (Subcutaneous Tissue) Exposed exposed. There is no tunneling or undermining noted. There is a medium amount of serous drainage noted. The wound margin is distinct with the outline attached to the wound base. There is medium (34-66%) pink granulation within the wound bed. There is a medium (34-66%) amount of necrotic tissue within the wound bed including Adherent Slough. Wound #5 status is Open. Original cause of wound was Gradually Appeared. The wound is located on the Left,Medial Lower Leg. The wound measures 0.4cm length x  0.5cm width x 0.1cm depth; 0.157cm^2 area and 0.016cm^3 volume. There is Fat Layer (Subcutaneous Tissue) Exposed exposed. There is no tunneling or undermining noted. There is a medium amount of serous drainage noted. The wound margin is distinct with the outline attached to the wound base. There is large (67-100%) pink granulation within the wound bed. There is a small (1-33%) amount of necrotic tissue within the wound  bed including Adherent Slough. Wound #6 status is Open. Original cause of wound was Gradually Appeared. The wound is located on the Left,Lateral Lower Leg. The wound measures 0.7cm length x 0.7cm width x 0.1cm depth; 0.385cm^2 area and 0.038cm^3 volume. There is Fat Layer (Subcutaneous Tissue) Exposed exposed. There is no tunneling or undermining noted. There is a medium amount of serous drainage noted. The wound margin is distinct with the outline attached to the wound base. There is large (67-100%) pink granulation within the wound bed. There is a small (1-33%) amount of necrotic tissue within the wound bed including Adherent Slough. Wound #7 status is Open. Original cause of wound was Gradually Appeared. The wound is located on the Left,Proximal,Lateral Lower Leg. The wound measures 0.3cm length x 0.6cm width x 0.1cm depth; 0.141cm^2 area and 0.014cm^3 volume. There is Fat Layer (Subcutaneous Tissue) Exposed exposed. There is no tunneling or undermining noted. There is a medium amount of serous drainage noted. The wound margin is distinct with the outline attached to the wound base. There is large (67-100%) pink granulation within the wound bed. There is a small (1-33%) amount of necrotic tissue within the wound bed including Adherent Slough. 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 There was a Four Layer Compression Therapy Procedure by Baruch Gouty, RN. Post procedure Diagnosis Wound #4: Same as Pre-Procedure Wound #5 Pre-procedure diagnosis of Wound #5 is a Lymphedema located on the Left,Medial Lower Leg . There was a Four Layer Compression Therapy Procedure by Baruch Gouty, RN. Post procedure Diagnosis Wound #5: Same as Pre-Procedure Wound #6 Pre-procedure diagnosis of Wound #6 is a Lymphedema located on the Left,Lateral Lower Leg . There was a Four Layer Compression Therapy Procedure by Baruch Gouty, RN. Post procedure Diagnosis Wound #6: Same as Pre-Procedure Wound #7 Pre-procedure diagnosis of Wound #7 is a Lymphedema located on the Left,Proximal,Lateral Lower Leg . There was a Four Layer Compression Therapy Procedure by Baruch Gouty, RN. Post procedure Diagnosis Wound #7: Same as Pre-Procedure Plan Follow-up Appointments: Return Appointment in 2 weeks. - Thursday Nurse Visit: - Thursday Dressing Change Frequency: Do not change entire dressing for one week. Skin Barriers/Peri-Wound Care: TCA Cream or Ointment - mixed with lotion to both legs liberally. Wound Cleansing: May shower with protection. - with cast protectors. Primary Wound Dressing: Wound #4 Right,Lateral Lower Leg: Calcium Alginate with Silver Wound #5 Left,Medial Lower Leg: Calcium Alginate with Silver Wound #6 Left,Lateral Lower Leg: Calcium Alginate with Silver Wound #7 Left,Proximal,Lateral Lower Leg: Calcium Alginate with Silver Secondary Dressing: ABD pad Other: - pad areas to legs in skin folds for protection. Edema Control: 4 layer compression - Bilateral Avoid standing for long periods of time Elevate legs to the level of the heart or above for 30 minutes daily and/or when sitting, a frequency of: - throughout the day. Exercise  regularly Segmental Compressive Device. - use lymphedema pumps twice a day an hour each time. Once in the morning and once in the evening. 1 Silver alginate to all wound areas 2. TCA to help with the erythema secondary to stasis dermatitis 3. 4-layer compression. He is to use his compression pumps twice a day for 1 hour. He has tolerated this well in  the past. It is really impossible to check his arterial status in his lower extremities are simply too much swelling. 4. I am not sure whether he has external compression stockings at home or not he will bring what he has next week so we can see them. 5. There are social issues here. His significant other is too frail to really do all of this. He has not eligible for home health for reasons that are not totally clear. We will bring him back here weekly he will see [place] for a nurse visit next week and I will see him again in 2 weeks. I do not expect that there would be much difficulty in healing these wounds I spent 35 minutes in review of this patient's past medical history, face-to-face evaluation and preparation of this record Electronic Signature(s) Signed: 01/09/2020 7:45:08 AM By: Linton Ham MD Entered By: Linton Ham on 01/08/2020 10:14:58 -------------------------------------------------------------------------------- HxROS Details Patient Name: Date of Service: Nicholas [name] DFO RD K. 01/08/2020 9:00 A M Medical Record Number: [id number] Patient Account Number: [id number] Date of Birth/Sex: Treating RN: 12-11-1945 (74 y.o. [name] Primary Care Provider: [name] Other Clinician: Referring Provider: Treating Provider/Extender: [name] in Treatment: 0 Information Obtained From Patient Constitutional Symptoms (General Health) Complaints and Symptoms: Negative for: Fatigue; Fever; Chills; Marked Weight Change Medical History: Past Medical History Notes: obesity Eyes Complaints and  Symptoms: Positive for: Glasses / Contacts Medical History: Negative for: Cataracts; Glaucoma; Optic Neuritis Ear/Nose/Mouth/Throat Complaints and Symptoms: Negative for: Chronic sinus problems or rhinitis Medical History: Negative for: Chronic sinus problems/congestion; Middle ear problems Respiratory Complaints and Symptoms: Negative for: Chronic or frequent coughs; Shortness of Breath Medical History: Positive for: Sleep Apnea - obstructive Negative for: Aspiration; Asthma; Chronic Obstructive Pulmonary Disease (COPD); Pneumothorax; Tuberculosis Cardiovascular Complaints and Symptoms: Negative for: Chest pain Medical History: Positive for: Congestive Heart Failure - diastolic; Hypertension; Peripheral Venous Disease Negative for: Angina; Arrhythmia; Hypotension; Myocardial Infarction; Peripheral Arterial Disease; Phlebitis; Vasculitis Past Medical History Notes: Hyperlidemia Gastrointestinal Complaints and Symptoms: Negative for: Frequent diarrhea; Nausea; Vomiting Medical History: Negative for: Cirrhosis ; Colitis; Crohns; Hepatitis A; Hepatitis B; Hepatitis C Endocrine Complaints and Symptoms: Negative for: Heat/cold intolerance Medical History: Negative for: Type I Diabetes; Type II Diabetes Genitourinary Complaints and Symptoms: Positive for: Frequent urination Medical History: Negative for: End Stage Renal Disease Past Medical History Notes: BPH Integumentary (Skin) Complaints and Symptoms: Positive for: Wounds - bil lower legs Medical History: Negative for: History of Burn Musculoskeletal Complaints and Symptoms: Positive for: Muscle Weakness Medical History: Negative for: Gout; Rheumatoid Arthritis; Osteoarthritis; Osteomyelitis Neurologic Complaints and Symptoms: Positive for: Numbness/parasthesias - both feet Medical History: Negative for: Dementia; Neuropathy; Quadriplegia; Paraplegia; Seizure Disorder Past Medical History  Notes: Parkinson's Psychiatric Complaints and Symptoms: Negative for: Claustrophobia; Suicidal Medical History: Negative for: Anorexia/bulimia; Confinement Anxiety Hematologic/Lymphatic Medical History: Positive for: Lymphedema Negative for: Anemia; Hemophilia; Human Immunodeficiency Virus; Sickle Cell Disease Immunological Medical History: Negative for: Lupus Erythematosus; Raynauds; Scleroderma Oncologic Medical History: Positive for: Received Chemotherapy Negative for: Received Radiation Past Medical History Notes: Melanoma in 2007 Immunizations Pneumococcal Vaccine: Received Pneumococcal Vaccination: Yes Implantable Devices None Hospitalization / Surgery History Type of Hospitalization/Surgery 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 Family and Social History Cancer: No; Diabetes: No; Heart Disease: No; Hereditary Spherocytosis: No; Hypertension: No; Kidney Disease: Yes - Father; Lung Disease: No; Seizures: No; Stroke: No; Thyroid Problems: No; Tuberculosis: No; Former smoker - quit 19 years ago; Marital Status - Married; Alcohol Use:  Never; Drug Use: No History; Caffeine Use: Rarely; Financial Concerns: No; Food, Clothing or Shelter Needs: No; Support System Lacking: No; Transportation Concerns: No Engineer, maintenance) Signed: 01/08/2020 5:22:52 PM By: Baruch Gouty RN, BSN Signed: 01/09/2020 7:45:08 AM By: Linton Ham MD Entered By: Baruch Gouty on 01/08/2020 09:12:58 -------------------------------------------------------------------------------- SuperBill Details Patient Name: Date of Service: Nicholas [name] DFO RD K. 01/08/2020 Medical Record Number: [id number] Patient Account Number: [id number] Date of Birth/Sex: Treating RN: February 11, 1946 (74 y.o. Nicholas [name] Primary Care Provider: [name] Other Clinician: Referring Provider: Treating Provider/Extender: [name] in Treatment: 0 Diagnosis Coding ICD-10 Codes Code  Description I89.0 Lymphedema, not elsewhere classified L97.811 Non-pressure chronic ulcer of other part of right lower leg limited to breakdown of skin L97.821 Non-pressure chronic ulcer of other part of left lower leg limited to breakdown of skin Facility Procedures CPT4: Code [id number] Description: 13 - WOUND CARE VISIT-LEV 3 EST PT Modifier: Quantity: 1 CPT4: [id number] Description: 81 BILATERAL: Application of multi-layer venous compression system; leg (below knee), including ankle and t. Modifier: Quantity: 1 Physician Procedures : CPT4 Code Description Modifier [id number] - WC PHYS LEVEL 4 - EST PT ICD-10 Diagnosis Description I89.0 Lymphedema, not elsewhere classified L97.811 Non-pressure chronic ulcer of other part of right lower leg limited to breakdown of skin L97.821  Non-pressure chronic ulcer of other part of left lower leg limited to breakdown of skin Quantity: 1 Electronic Signature(s) Signed: 01/09/2020 7:45:08 AM By: Linton Ham MD Entered By: Linton Ham on 01/08/2020 10:15:23

## 2020-01-09 NOTE — Progress Notes (Signed)
Nicholas [name] ([id number]) , Visit Report for 01/08/2020 Allergy List Details Patient Name: Date of Service: Nicholas Junes DFO RD K. 01/08/2020 9:00 A M Medical Record Number: [id number] Patient Account Number: [id number] Date of Birth/Sex: Treating RN: 04-04-1946 (74 y.o. [name] Primary Care Norvell Caswell: [name] Other Clinician: Referring Yulonda Wheeling: Treating Theodus Ran/Extender: [name] in Treatment: 0 Allergies Active Allergies amoxicillin Reaction: swelling Severity: Severe Allergy Notes Electronic Signature(s) Signed: 01/08/2020 5:22:52 PM By: Baruch Gouty RN, BSN Entered By: Baruch Gouty on 01/08/2020 09:07:20 -------------------------------------------------------------------------------- Arrival Information Details Patient Name: Date of Service: Nicholas Junes DFO RD K. 01/08/2020 9:00 A M Medical Record Number: [id number] Patient Account Number: [id number] Date of Birth/Sex: Treating RN: Jan 12, 1946 (74 y.o. [name] Primary Care Allice Garro: [name] Other Clinician: Referring Tran Randle: Treating Elianys Conry/Extender: [name] in Treatment: 0 Visit Information Patient Arrived: Wheel Chair Arrival Time: 09:04 Accompanied By: spouse Transfer Assistance: None Patient Identification Verified: Yes Secondary Verification Process Completed: Yes Patient Requires Transmission-Based Precautions: No Patient Has Alerts: Yes Patient Alerts: ABI: [place] 07/2018 History Since Last Visit Has Dressing in Place as Prescribed: Yes Has Compression in Place as Prescribed: Yes Electronic Signature(s) Signed: 01/08/2020 5:45:21 PM By: Deon Pilling Entered By: Deon Pilling on 01/08/2020 09:59:28 -------------------------------------------------------------------------------- Clinic Level of Care Assessment Details Patient Name: Date of Service: Nicholas Junes DFO RD K. 01/08/2020 9:00 A M Medical Record  Number: [id number] Patient Account Number: [id number] Date of Birth/Sex: Treating RN: 1945-08-29 (74 y.o. Nicholas [name] Primary Care Vernis Cabacungan: [name] Other Clinician: Referring Zymeir Salminen: Treating Nadie Fiumara/Extender: [name] in Treatment: 0 Clinic Level of Care Assessment Items TOOL 1 Quantity Score X- 1 0 Use when EandM and Procedure is performed on INITIAL visit ASSESSMENTS - Nursing Assessment / Reassessment X- 1 20 General Physical Exam (combine w/ comprehensive assessment (listed just below) when performed on new pt. evals) X- 1 25 Comprehensive Assessment (HX, ROS, Risk Assessments, Wounds Hx, etc.) ASSESSMENTS - Wound and Skin Assessment / Reassessment X- 1 10 Dermatologic / Skin Assessment (not related to wound area) ASSESSMENTS - Ostomy and/or Continence Assessment and Care []  - 0 Incontinence Assessment and Management []  - 0 Ostomy Care Assessment and Management (repouching, etc.) PROCESS - Coordination of Care []  - 0 Simple Patient / Family Education for ongoing care X- 1 20 Complex (extensive) Patient / Family Education for ongoing care X- 1 10 Staff obtains Programmer, systems, Records, T Results / Process Orders est []  - 0 Staff telephones HHA, Nursing Homes / Clarify orders / etc []  - 0 Routine Transfer to another Facility (non-emergent condition) []  - 0 Routine Hospital Admission (non-emergent condition) X- 1 15 New Admissions / Biomedical engineer / Ordering NPWT Apligraf, etc. , []  - 0 Emergency Hospital Admission (emergent condition) PROCESS - Special Needs []  - 0 Pediatric / Minor Patient Management []  - 0 Isolation Patient Management []  - 0 Hearing / Language / Visual special needs []  - 0 Assessment of Community assistance (transportation, D/C planning, etc.) []  - 0 Additional assistance / Altered mentation []  - 0 Support Surface(s) Assessment (bed, cushion, seat, etc.) INTERVENTIONS - Miscellaneous []  -  0 External ear exam []  - 0 Patient Transfer (multiple staff / Civil Service fast streamer / Similar devices) []  - 0 Simple Staple / Suture removal (25 or less) []  - 0 Complex Staple / Suture removal (26 or more) []  - 0 Hypo/Hyperglycemic Management (do not check if billed separately) []  -  0 Ankle / Brachial Index (ABI) - do not check if billed separately Has the patient been seen at the hospital within the last three years: Yes Total Score: 100 Level Of Care: New/Established - Level 3 Electronic Signature(s) Signed: 01/08/2020 5:45:21 PM By: Deon Pilling Entered By: Deon Pilling on 01/08/2020 09:58:52 -------------------------------------------------------------------------------- Compression Therapy Details Patient Name: Date of Service: Nicholas Junes DFO RD K. 01/08/2020 9:00 A M Medical Record Number: [id number] Patient Account Number: [id number] Date of Birth/Sex: Treating RN: Oct 07, 1945 (74 y.o. Nicholas [name] Primary Care Chequita Mofield: [name] Other Clinician: Referring Ronnae Kaser: Treating Susana Gripp/Extender: [name] in Treatment: 0 Compression Therapy Performed for Wound Assessment: Wound #4 Right,Lateral Lower Leg Performed By: Clinician Baruch Gouty, RN Compression Type: Four Layer Post Procedure Diagnosis Same as Pre-procedure Electronic Signature(s) Signed: 01/08/2020 5:45:21 PM By: Deon Pilling Entered By: Deon Pilling on 01/08/2020 09:58:24 -------------------------------------------------------------------------------- Compression Therapy Details Patient Name: Date of Service: Nicholas Junes DFO RD K. 01/08/2020 9:00 A M Medical Record Number: [id number] Patient Account Number: [id number] Date of Birth/Sex: Treating RN: 11/27/1945 (74 y.o. Nicholas [name] Primary Care Brystal Kildow: [name] Other Clinician: Referring Julianne Chamberlin: Treating Anelis Hrivnak/Extender: [name] in Treatment: 0 Compression Therapy  Performed for Wound Assessment: Wound #6 Left,Lateral Lower Leg Performed By: Clinician Baruch Gouty, RN Compression Type: Four Layer Post Procedure Diagnosis Same as Pre-procedure Electronic Signature(s) Signed: 01/08/2020 5:45:21 PM By: Deon Pilling Entered By: Deon Pilling on 01/08/2020 09:58:24 -------------------------------------------------------------------------------- Compression Therapy Details Patient Name: Date of Service: Nicholas Junes DFO RD K. 01/08/2020 9:00 A M Medical Record Number: [id number] Patient Account Number: [id number] Date of Birth/Sex: Treating RN: 04-24-1946 (74 y.o. Nicholas [name] Primary Care Rhia Blatchford: [name] Other Clinician: Referring Noelly Lasseigne: Treating Briyan Kleven/Extender: [name] in Treatment: 0 Compression Therapy Performed for Wound Assessment: Wound #5 Left,Medial Lower Leg Performed By: Clinician Baruch Gouty, RN Compression Type: Four Layer Post Procedure Diagnosis Same as Pre-procedure Electronic Signature(s) Signed: 01/08/2020 5:45:21 PM By: Deon Pilling Entered By: Deon Pilling on 01/08/2020 09:58:25 -------------------------------------------------------------------------------- Compression Therapy Details Patient Name: Date of Service: Nicholas Junes DFO RD K. 01/08/2020 9:00 A M Medical Record Number: [id number] Patient Account Number: [id number] Date of Birth/Sex: Treating RN: [birth year] (74 y.o. Nicholas [name] Primary Care Jori Thrall: [name] Other Clinician: Referring Toddrick Sanna: Treating Vicky Mccanless/Extender: [name] in Treatment: 0 Compression Therapy Performed for Wound Assessment: Wound #7 Left,Proximal,Lateral Lower Leg Performed By: Clinician Baruch Gouty, RN Compression Type: Four Layer Post Procedure Diagnosis Same as Pre-procedure Electronic Signature(s) Signed: 01/08/2020 5:45:21 PM By: Deon Pilling Entered By: Deon Pilling on  01/08/2020 09:58:25 -------------------------------------------------------------------------------- Encounter Discharge Information Details Patient Name: Date of Service: Nicholas Junes DFO RD K. 01/08/2020 9:00 A M Medical Record Number: [id number] Patient Account Number: [id number] Date of Birth/Sex: Treating RN: 11-04-1945 (74 y.o. [name] Primary Care Murrell Elizondo: [name] Other Clinician: Referring Jeweline Reif: Treating Elgie Maziarz/Extender: [name] in Treatment: 0 Encounter Discharge Information Items Discharge Condition: Stable Ambulatory Status: Wheelchair Discharge Destination: Home Transportation: Private Auto Accompanied By: spouse Schedule Follow-up Appointment: Yes Clinical Summary of Care: Patient Declined Electronic Signature(s) Signed: 01/08/2020 5:22:52 PM By: Baruch Gouty RN, BSN Entered By: Baruch Gouty on 01/08/2020 10:32:27 -------------------------------------------------------------------------------- Lower Extremity Assessment Details Patient Name: Date of Service: Nicholas Junes DFO RD K. 01/08/2020 9:00 A M Medical Record Number: [id number] Patient Account Number: [id number] Date of Birth/Sex: Treating RN: [birth year] (  74 y.o. [name] Primary Care Sherissa Tenenbaum: [name] Other Clinician: Referring Marlina Cataldi: Treating Jadie Comas/Extender: [name] in Treatment: 0 Edema Assessment Assessed: [name]: No] [name]: No] Edema: [Left: Yes] [Right: Yes] Calf Left: Right: Point of Measurement: 40 cm From Medial Instep 57.5 cm 57.5 cm Ankle Left: Right: Point of Measurement: 19 cm From Medial Instep 42.5 cm 44.5 cm Vascular Assessment Pulses: Dorsalis Pedis Palpable: [Left:No] [Right:No] Notes bil ankles too large to obtain ABIs Electronic Signature(s) Signed: 01/08/2020 5:22:52 PM By: Baruch Gouty RN, BSN Entered By: Baruch Gouty on 01/08/2020  09:20:36 -------------------------------------------------------------------------------- Multi Wound Chart Details Patient Name: Date of Service: Nicholas Junes DFO RD K. 01/08/2020 9:00 A M Medical Record Number: [id number] Patient Account Number: [id number] Date of Birth/Sex: Treating RN: 08/05/1945 (74 y.o. [name], Meta.Reding Primary Care Neri Vieyra: [name] Other Clinician: Referring Macee Venables: Treating Tycho Cheramie/Extender: [name] in Treatment: 0 Vital Signs Height(in): 67 Pulse(bpm): 86 Weight(lbs): 250 Blood Pressure(mmHg): 122/77 Body Mass Index(BMI): 39 Temperature(F): 98.6 Respiratory Rate(breaths/min): 20 Photos: [4:No Photos Right, Lateral Lower Leg] [5:No Photos Left, Medial Lower Leg] [6:No Photos Left, Lateral Lower Leg] Wound Location: [4:Gradually Appeared] [5:Gradually Appeared] [6:Gradually Appeared] Wounding Event: [4:Lymphedema] [5:Lymphedema] [6:Lymphedema] Primary Etiology: [4:Lymphedema, Sleep Apnea,] [5:Lymphedema, Sleep Apnea,] [6:Lymphedema, Sleep Apnea,] Comorbid History: [4:Congestive Heart Failure, Hypertension, Peripheral Venous Disease, Received Chemotherapy 12/03/2019] [5:Congestive Heart Failure, Hypertension, Peripheral Venous Disease, Received Chemotherapy 12/03/2019] [6:Congestive Heart Failure,  Hypertension, Peripheral Venous Disease, Received Chemotherapy 12/03/2019] Date Acquired: [4:0] [5:0] [6:0] Weeks of Treatment: [4:Open] [5:Open] [6:Open] Wound Status: [4:2x1.5x0.1] [5:0.4x0.5x0.1] [6:0.7x0.7x0.1] Measurements L x W x D (cm) [4:2.356] [5:0.157] [6:0.385] A (cm) : rea [4:0.236] [5:0.016] [6:0.038] Volume (cm) : [4:Full Thickness Without Exposed] [5:Full Thickness Without Exposed] [6:Full Thickness Without Exposed] Classification: [4:Support Structures Medium] [5:Support Structures Medium] [6:Support Structures Medium] Exudate Amount: [4:Serous] [5:Serous] [6:Serous] Exudate Type: [4:amber] [5:amber]  [6:amber] Exudate Color: [4:Distinct, outline attached] [5:Distinct, outline attached] [6:Distinct, outline attached] Wound Margin: [4:Medium (34-66%)] [5:Large (67-100%)] [6:Large (67-100%)] Granulation Amount: [4:Pink] [5:Pink] [6:Pink] Granulation Quality: [4:Medium (34-66%)] [5:Small (1-33%)] [6:Small (1-33%)] Necrotic Amount: [4:Fat Layer (Subcutaneous Tissue)] [5:Fat Layer (Subcutaneous Tissue)] [6:Fat Layer (Subcutaneous Tissue)] Exposed Structures: [4:Exposed: Yes Fascia: No Tendon: No Muscle: No Joint: No Bone: No None] [5:Exposed: Yes Fascia: No Tendon: No Muscle: No Joint: No Bone: No None] [6:Exposed: Yes Fascia: No Tendon: No Muscle: No Joint: No Bone: No None] Epithelialization: [4:Compression Therapy] [5:Compression Therapy] [6:Compression Therapy] Wound Number: 7 N/A N/A Photos: No Photos N/A N/A Left, Proximal, Lateral Lower Leg N/A N/A Wound Location: Gradually Appeared N/A N/A Wounding Event: Lymphedema N/A N/A Primary Etiology: Lymphedema, Sleep Apnea, N/A N/A Comorbid History: Congestive Heart Failure, Hypertension, Peripheral Venous Disease, Received Chemotherapy 12/03/2019 N/A N/A Date Acquired: 0 N/A N/A Weeks of Treatment: Open N/A N/A Wound Status: 0.3x0.6x0.1 N/A N/A Measurements L x W x D (cm) 0.141 N/A N/A A (cm) : rea 0.014 N/A N/A Volume (cm) : Full Thickness Without Exposed N/A N/A Classification: Support Structures Medium N/A N/A Exudate Amount: Serous N/A N/A Exudate Type: amber N/A N/A Exudate Color: Distinct, outline attached N/A N/A Wound Margin: Large (67-100%) N/A N/A Granulation Amount: Pink N/A N/A Granulation Quality: Small (1-33%) N/A N/A Necrotic Amount: Fat Layer (Subcutaneous Tissue) N/A N/A Exposed Structures: Exposed: Yes Fascia: No Tendon: No Muscle: No Joint: No Bone: No None N/A N/A Epithelialization: Compression Therapy N/A N/A Procedures Performed: Treatment Notes Electronic Signature(s) Signed:  01/08/2020 5:45:21 PM By: Deon Pilling Signed: 01/09/2020 7:45:08 AM By: Dellia Nims,  Legrand Como MD Entered By: Linton Ham on 01/08/2020 10:05:42 -------------------------------------------------------------------------------- Multi-Disciplinary Care Plan Details Patient Name: Date of Service: Nicholas Junes DFO RD K. 01/08/2020 9:00 A M Medical Record Number: [id number] Patient Account Number: [id number] Date of Birth/Sex: Treating RN: 1946/04/11 (74 y.o. Nicholas [name] Primary Care Meeghan Skipper: [name] Other Clinician: Referring Shardai Star: Treating Rilynne Lonsway/Extender: [name] in Treatment: 0 Active Inactive Orientation to the Wound Care Program Nursing Diagnoses: Knowledge deficit related to the wound healing center program Goals: Patient/caregiver will verbalize understanding of the [place] Date Initiated: 01/08/2020 Target Resolution Date: 01/30/2020 Goal Status: Active Interventions: Provide education on orientation to the wound center Notes: Venous Leg Ulcer Nursing Diagnoses: Potential for venous Insuffiency (use before diagnosis confirmed) Goals: Patient will maintain optimal edema control Date Initiated: 01/08/2020 Target Resolution Date: 01/30/2020 Goal Status: Active Patient/caregiver will verbalize understanding of disease process and disease management Date Initiated: 01/08/2020 Target Resolution Date: 01/30/2020 Goal Status: Active Interventions: Assess peripheral edema status every visit. Notes: Wound/Skin Impairment Nursing Diagnoses: Knowledge deficit related to ulceration/compromised skin integrity Goals: Patient/caregiver will verbalize understanding of skin care regimen Date Initiated: 01/08/2020 Target Resolution Date: 01/30/2020 Goal Status: Active Ulcer/skin breakdown will have a volume reduction of 30% by week 4 Date Initiated: 01/08/2020 Target Resolution Date: 01/30/2020 Goal Status:  Active Interventions: Assess patient/caregiver ability to obtain necessary supplies Assess patient/caregiver ability to perform ulcer/skin care regimen upon admission and as needed Provide education on ulcer and skin care Treatment Activities: Skin care regimen initiated : 01/08/2020 Topical wound management initiated : 01/08/2020 Notes: Electronic Signature(s) Signed: 01/08/2020 5:45:21 PM By: Deon Pilling Entered By: Deon Pilling on 01/08/2020 09:10:45 -------------------------------------------------------------------------------- Pain Assessment Details Patient Name: Date of Service: Nicholas Junes DFO RD K. 01/08/2020 9:00 A M Medical Record Number: [id number] Patient Account Number: [id number] Date of Birth/Sex: Treating RN: 1946-06-09 (74 y.o. [name] Primary Care Pinkey Mcjunkin: [name] Other Clinician: Referring Omer Puccinelli: Treating Najae Rathert/Extender: [name] in Treatment: 0 Active Problems Location of Pain Severity and Description of Pain Patient Has Paino No Site Locations Pain Management and Medication Current Pain Management: Electronic Signature(s) Signed: 01/08/2020 5:22:52 PM By: Baruch Gouty RN, BSN Entered By: Baruch Gouty on 01/08/2020 09:32:50 -------------------------------------------------------------------------------- Patient/Caregiver Education Details Patient Name: Date of Service: Nicholas Junes DFO RD K. 6/17/2021andnbsp9:00 A M Medical Record Number: [id number] Patient Account Number: [id number] Date of Birth/Gender: Treating RN: Jun 14, 1946 (74 y.o. Nicholas [name] Primary Care Physician: [name] Other Clinician: Referring Physician: Treating Physician/Extender: [name] in Treatment: 0 Education Assessment Education Provided To: Patient Education Topics Provided [place]: o Handouts: Welcome T [facility] o Methods:  Explain/Verbal Responses: Reinforcements needed Electronic Signature(s) Signed: 01/08/2020 5:45:21 PM By: Deon Pilling Entered By: Deon Pilling on 01/08/2020 09:10:53 -------------------------------------------------------------------------------- Wound Assessment Details Patient Name: Date of Service: Nicholas Junes DFO RD K. 01/08/2020 9:00 A M Medical Record Number: [id number] Patient Account Number: [id number] Date of Birth/Sex: Treating RN: 1946-06-01 (74 y.o. [name] Primary Care Larysa Pall: [name] Other Clinician: Referring Konica Stankowski: Treating Oluwadamilola Deliz/Extender: [name] in Treatment: 0 Wound Status Wound Number: 4 Primary Lymphedema Etiology: Wound Location: Right, Lateral Lower Leg Wound Open Wounding Event: Gradually Appeared Status: Date Acquired: 12/03/2019 Comorbid Lymphedema, Sleep Apnea, Congestive Heart Failure, Weeks Of Treatment: 0 History: Hypertension, Peripheral Venous Disease, Received Chemotherapy Clustered Wound: No Wound Measurements Length: (cm) 2 Width: (cm)  1.5 Depth: (cm) 0.1 Area: (cm) 2.356 Volume: (cm) 0.236 % Reduction in Area: % Reduction in Volume: Epithelialization: None Tunneling: No Undermining: No Wound Description Classification: Full Thickness Without Exposed Support Structures Wound Margin: Distinct, outline attached Exudate Amount: Medium Exudate Type: Serous Exudate Color: amber Foul Odor After Cleansing: No Slough/Fibrino Yes Wound Bed Granulation Amount: Medium (34-66%) Exposed Structure Granulation Quality: Pink Fascia Exposed: No Necrotic Amount: Medium (34-66%) Fat Layer (Subcutaneous Tissue) Exposed: Yes Necrotic Quality: Adherent Slough Tendon Exposed: No Muscle Exposed: No Joint Exposed: No Bone Exposed: No Treatment Notes Wound #4 (Right, Lateral Lower Leg) 2. Periwound Care Moisturizing lotion TCA Cream 3. Primary Dressing Applied Calcium Alginate Ag 4.  Secondary Dressing ABD Pad 6. Support Layer Applied 4 layer compression Water quality scientist) Signed: 01/08/2020 5:22:52 PM By: Baruch Gouty RN, BSN Entered By: Baruch Gouty on 01/08/2020 09:26:34 -------------------------------------------------------------------------------- Wound Assessment Details Patient Name: Date of Service: Nicholas Junes DFO RD K. 01/08/2020 9:00 A M Medical Record Number: [id number] Patient Account Number: [id number] Date of Birth/Sex: Treating RN: Feb 16, 1946 (74 y.o. [name] Primary Care Othmar Ringer: [name] Other Clinician: Referring Caydin Yeatts: Treating Zaina Jenkin/Extender: [name] in Treatment: 0 Wound Status Wound Number: 5 Primary Lymphedema Etiology: Wound Location: Left, Medial Lower Leg Wound Open Wounding Event: Gradually Appeared Status: Date Acquired: 12/03/2019 Comorbid Lymphedema, Sleep Apnea, Congestive Heart Failure, Weeks Of Treatment: 0 History: Hypertension, Peripheral Venous Disease, Received Chemotherapy Clustered Wound: No Wound Measurements Length: (cm) 0.4 Width: (cm) 0.5 Depth: (cm) 0.1 Area: (cm) 0.157 Volume: (cm) 0.016 % Reduction in Area: % Reduction in Volume: Epithelialization: None Tunneling: No Undermining: No Wound Description Classification: Full Thickness Without Exposed Support Structures Wound Margin: Distinct, outline attached Exudate Amount: Medium Exudate Type: Serous Exudate Color: amber Foul Odor After Cleansing: No Slough/Fibrino Yes Wound Bed Granulation Amount: Large (67-100%) Exposed Structure Granulation Quality: Pink Fascia Exposed: No Necrotic Amount: Small (1-33%) Fat Layer (Subcutaneous Tissue) Exposed: Yes Necrotic Quality: Adherent Slough Tendon Exposed: No Muscle Exposed: No Joint Exposed: No Bone Exposed: No Treatment Notes Wound #5 (Left, Medial Lower Leg) 2. Periwound Care Moisturizing lotion TCA Cream 3. Primary  Dressing Applied Calcium Alginate Ag 4. Secondary Dressing ABD Pad 6. Support Layer Applied 4 layer compression Water quality scientist) Signed: 01/08/2020 5:22:52 PM By: Baruch Gouty RN, BSN Entered By: Baruch Gouty on 01/08/2020 54:00:86 -------------------------------------------------------------------------------- Wound Assessment Details Patient Name: Date of Service: Nicholas Junes DFO RD K. 01/08/2020 9:00 A M Medical Record Number: [id number] Patient Account Number: [id number] Date of Birth/Sex: Treating RN: [birth year] (74 y.o. [name] Primary Care Astin Sayre: [name] Other Clinician: Referring Rayaan Garguilo: Treating Iley Deignan/Extender: [name] in Treatment: 0 Wound Status Wound Number: 6 Primary Lymphedema Etiology: Wound Location: Left, Lateral Lower Leg Wound Open Wounding Event: Gradually Appeared Status: Date Acquired: 12/03/2019 Comorbid Lymphedema, Sleep Apnea, Congestive Heart Failure, Weeks Of Treatment: 0 History: Hypertension, Peripheral Venous Disease, Received Chemotherapy Clustered Wound: No Wound Measurements Length: (cm) 0.7 Width: (cm) 0.7 Depth: (cm) 0.1 Area: (cm) 0.385 Volume: (cm) 0.038 % Reduction in Area: % Reduction in Volume: Epithelialization: None Tunneling: No Undermining: No Wound Description Classification: Full Thickness Without Exposed Support Structures Wound Margin: Distinct, outline attached Exudate Amount: Medium Exudate Type: Serous Exudate Color: amber Foul Odor After Cleansing: No Slough/Fibrino Yes Wound Bed Granulation Amount: Large (67-100%) Exposed Structure Granulation Quality: Pink Fascia Exposed: No Necrotic Amount: Small (1-33%) Fat Layer (Subcutaneous Tissue) Exposed: Yes Necrotic Quality: Adherent Slough Tendon Exposed:  No Muscle Exposed: No Joint Exposed: No Bone Exposed: No Treatment Notes Wound #6 (Left, Lateral Lower Leg) 2. Periwound  Care Moisturizing lotion TCA Cream 3. Primary Dressing Applied Calcium Alginate Ag 4. Secondary Dressing ABD Pad 6. Support Layer Applied 4 layer compression Water quality scientist) Signed: 01/08/2020 5:22:52 PM By: Baruch Gouty RN, BSN Entered By: Baruch Gouty on 01/08/2020 09:30:33 -------------------------------------------------------------------------------- Wound Assessment Details Patient Name: Date of Service: Nicholas Junes DFO RD K. 01/08/2020 9:00 A M Medical Record Number: [id number] Patient Account Number: [id number] Date of Birth/Sex: Treating RN: September 27, 1945 (74 y.o. [name] Primary Care Briseidy Spark: [name] Other Clinician: Referring Dominico Rod: Treating Jimmy Plessinger/Extender: [name] in Treatment: 0 Wound Status Wound Number: 7 Primary Lymphedema Etiology: Wound Location: Left, Proximal, Lateral Lower Leg Wound Open Wounding Event: Gradually Appeared Status: Status: Date Acquired: 12/03/2019 Comorbid Lymphedema, Sleep Apnea, Congestive Heart Failure, Weeks Of Treatment: 0 History: Hypertension, Peripheral Venous Disease, Received Chemotherapy Clustered Wound: No Wound Measurements Length: (cm) 0.3 Width: (cm) 0.6 Depth: (cm) 0.1 Area: (cm) 0.141 Volume: (cm) 0.014 % Reduction in Area: % Reduction in Volume: Epithelialization: None Tunneling: No Undermining: No Wound Description Classification: Full Thickness Without Exposed Support Structures Wound Margin: Distinct, outline attached Exudate Amount: Medium Exudate Type: Serous Exudate Color: amber Foul Odor After Cleansing: No Slough/Fibrino Yes Wound Bed Granulation Amount: Large (67-100%) Exposed Structure Granulation Quality: Pink Fascia Exposed: No Necrotic Amount: Small (1-33%) Fat Layer (Subcutaneous Tissue) Exposed: Yes Necrotic Quality: Adherent Slough Tendon Exposed: No Muscle Exposed: No Joint Exposed: No Bone Exposed:  No Treatment Notes Wound #7 (Left, Proximal, Lateral Lower Leg) 2. Periwound Care Moisturizing lotion TCA Cream 3. Primary Dressing Applied Calcium Alginate Ag 4. Secondary Dressing ABD Pad 6. Support Layer Applied 4 layer compression Water quality scientist) Signed: 01/08/2020 5:22:52 PM By: Baruch Gouty RN, BSN Entered By: Baruch Gouty on 01/08/2020 09:32:24 -------------------------------------------------------------------------------- Vitals Details Patient Name: Date of Service: Nicholas Junes DFO RD K. 01/08/2020 9:00 A M Medical Record Number: [id number] Patient Account Number: [id number] Date of Birth/Sex: Treating RN: [birth year] (74 y.o. [name] Primary Care Della Scrivener: [name] Other Clinician: Referring Lily Velasquez: Treating Dina Warbington/Extender: [name] in Treatment: 0 Vital Signs Time Taken: 09:05 Temperature (F): 98.6 Height (in): 67 Pulse (bpm): 84 Source: Stated Respiratory Rate (breaths/min): 20 Weight (lbs): 250 Blood Pressure (mmHg): 122/77 Source: Stated Reference Range: 80 - 120 mg / dl Body Mass Index (BMI): 39.2 Electronic Signature(s) Signed: 01/08/2020 5:22:52 PM By: Baruch Gouty RN, BSN Entered By: Baruch Gouty on 01/08/2020 09:06:42

## 2020-01-10 ENCOUNTER — Other Ambulatory Visit: Payer: Self-pay | Admitting: Family Medicine

## 2020-01-13 ENCOUNTER — Encounter (HOSPITAL_BASED_OUTPATIENT_CLINIC_OR_DEPARTMENT_OTHER): Payer: PPO | Admitting: Internal Medicine

## 2020-01-15 ENCOUNTER — Encounter (HOSPITAL_BASED_OUTPATIENT_CLINIC_OR_DEPARTMENT_OTHER): Payer: PPO | Admitting: Internal Medicine

## 2020-01-15 ENCOUNTER — Other Ambulatory Visit: Payer: Self-pay

## 2020-01-15 DIAGNOSIS — L97821 Non-pressure chronic ulcer of other part of left lower leg limited to breakdown of skin: Secondary | ICD-10-CM | POA: Diagnosis not present

## 2020-01-16 NOTE — Progress Notes (Signed)
COLLINS KERBY ([id number]) , Visit Report for 01/15/2020 SuperBill Details Patient Name: Date of Service: Nicholas [name] DFO RD K. 01/15/2020 Medical Record Number: [id number] Patient Account Number: [id number] Date of Birth/Sex: Treating RN: [birth year] (74 y.o. [name] Primary Care Provider: [name] Other Clinician: Referring Provider: Treating Provider/Extender: [name] in Treatment: 1 Diagnosis Coding ICD-10 Codes Code Description I89.0 Lymphedema, not elsewhere classified L97.811 Non-pressure chronic ulcer of other part of right lower leg limited to breakdown of skin L97.821 Non-pressure chronic ulcer of other part of left lower leg limited to breakdown of skin Facility Procedures CPT4 Description Modifier Quantity Code [id number] BILATERAL: Application of multi-layer venous compression system; leg (below knee), including ankle and 1 foot. Electronic Signature(s) Signed: 01/16/2020 5:27:17 PM By: Linton Ham MD Signed: 01/16/2020 5:45:26 PM By: Levan Hurst RN, BSN Entered By: Levan Hurst on 01/15/2020 10:36:14

## 2020-01-16 NOTE — Progress Notes (Signed)
ZYMIRE TURNBO ([id number]) , Visit Report for 01/15/2020 Arrival Information Details Patient Name: Date of Service: Nicholas [name] DFO RD K. 01/15/2020 9:45 A M Medical Record Number: [id number] Patient Account Number: [id number] Date of Birth/Sex: Treating RN: 26-Aug-1945 (74 y.o. [name] Primary Care Arlanda Shiplett: [name] Other Clinician: Referring Tamorah Hada: Treating Arcadia Gorgas/Extender: [name] in Treatment: 1 Visit Information History Since Last Visit Added or deleted any medications: No Patient Arrived: Wheel Chair Any new allergies or adverse reactions: No Arrival Time: 09:52 Had a fall or experienced change in No Accompanied By: wife activities of daily living that may affect Transfer Assistance: Manual risk of falls: Patient Identification Verified: Yes Signs or symptoms of abuse/neglect since last visito No Secondary Verification Process Completed: Yes Hospitalized since last visit: No Patient Requires Transmission-Based Precautions: No Implantable device outside of the clinic excluding No Patient Has Alerts: Yes cellular tissue based products placed in the center Patient Alerts: ABI: [place] 07/2018 since last visit: Has Dressing in Place as Prescribed: Yes Has Compression in Place as Prescribed: Yes Pain Present Now: No Electronic Signature(s) Signed: 01/16/2020 5:45:26 PM By: Levan Hurst RN, BSN Entered By: Levan Hurst on 01/15/2020 09:52:24 -------------------------------------------------------------------------------- Compression Therapy Details Patient Name: Date of Service: Nicholas [name] DFO RD K. 01/15/2020 9:45 A M Medical Record Number: [id number] Patient Account Number: [id number] Date of Birth/Sex: Treating RN: 1946/01/17 (73 y.o. [name] Primary Care Chinaza Rooke: [name] Other Clinician: Referring Chrysten Woulfe: Treating Novia Lansberry/Extender: [name] in Treatment:  1 Compression Therapy Performed for Wound Assessment: Wound #4 Right,Lateral Lower Leg Performed By: Clinician Levan Hurst, RN Compression Type: Four Layer Electronic Signature(s) Signed: 01/16/2020 5:45:26 PM By: Levan Hurst RN, BSN Entered By: Levan Hurst on 01/15/2020 10:34:32 -------------------------------------------------------------------------------- Compression Therapy Details Patient Name: Date of Service: Nicholas [name] DFO RD K. 01/15/2020 9:45 A M Medical Record Number: [id number] Patient Account Number: [id number] Date of Birth/Sex: Treating RN: 01/25/1946 (74 y.o. [name] Primary Care Kaijah Abts: [name] Other Clinician: Referring Elchonon Maxson: Treating Rogena Deupree/Extender: [name] in Treatment: 1 Compression Therapy Performed for Wound Assessment: Wound #5 Left,Medial Lower Leg Performed By: Clinician Levan Hurst, RN Compression Type: Four Layer Electronic Signature(s) Signed: 01/16/2020 5:45:26 PM By: Levan Hurst RN, BSN Entered By: Levan Hurst on 01/15/2020 10:34:32 -------------------------------------------------------------------------------- Compression Therapy Details Patient Name: Date of Service: Nicholas [name] DFO RD K. 01/15/2020 9:45 A M Medical Record Number: [id number] Patient Account Number: [id number] Date of Birth/Sex: Treating RN: 1945/09/27 (74 y.o. [name] Primary Care Arienna Benegas: [name] Other Clinician: Referring Armentha Branagan: Treating Charleston Hankin/Extender: [name] in Treatment: 1 Compression Therapy Performed for Wound Assessment: Wound #6 Left,Lateral Lower Leg Performed By: Clinician Levan Hurst, RN Compression Type: Four Layer Electronic Signature(s) Signed: 01/16/2020 5:45:26 PM By: Levan Hurst RN, BSN Entered By: Levan Hurst on 01/15/2020  10:34:33 -------------------------------------------------------------------------------- Compression Therapy Details Patient Name: Date of Service: Nicholas [name] DFO RD K. 01/15/2020 9:45 A M Medical Record Number: [id number] Patient Account Number: [id number] Date of Birth/Sex: Treating RN: 1946/05/14 (74 y.o. [name] Primary Care Akyra Bouchie: [name] Other Clinician: Referring Brynn Mulgrew: Treating Hiliana Eilts/Extender: [name] in Treatment: 1 Compression Therapy Performed for Wound Assessment: Wound #7 Left,Proximal,Lateral Lower Leg Performed By: Clinician Levan Hurst, RN Compression Type: Four Layer Electronic Signature(s) Signed: 01/16/2020 5:45:26 PM By: Levan Hurst RN, BSN Entered By: Levan Hurst on 01/15/2020 10:34:33 -------------------------------------------------------------------------------- Encounter  Discharge Information Details Patient Name: Date of Service: Nicholas [name] DFO RD K. 01/15/2020 9:45 A M Medical Record Number: [id number] Patient Account Number: [id number] Date of Birth/Sex: Treating RN: 09-22-1945 (74 y.o. [name] Primary Care Barbera Perritt: Other Clinician: [name] Referring Jw Covin: Treating Vergie Zahm/Extender: [name] in Treatment: 1 Encounter Discharge Information Items Discharge Condition: Stable Ambulatory Status: Wheelchair Discharge Destination: Home Transportation: Private Auto Accompanied By: wife Schedule Follow-up Appointment: Yes Clinical Summary of Care: Patient Declined Electronic Signature(s) Signed: 01/16/2020 5:45:26 PM By: Levan Hurst RN, BSN Entered By: Levan Hurst on 01/15/2020 10:36:01 -------------------------------------------------------------------------------- Wound Assessment Details Patient Name: Date of Service: Nicholas [name] DFO RD K. 01/15/2020 9:45 A M Medical Record Number: [id number] Patient Account Number:  [id number] Date of Birth/Sex: Treating RN: May 01, 1946 (74 y.o. [name] Primary Care Deeanna Beightol: [name] Other Clinician: Referring [name] Pautsch: Treating Karin Pinedo/Extender: [name] in Treatment: 1 Wound Status Wound Number: 4 Primary Lymphedema Etiology: Wound Location: Right, Lateral Lower Leg Wound Open Wounding Event: Gradually Appeared Status: Date Acquired: 12/03/2019 Comorbid Lymphedema, Sleep Apnea, Congestive Heart Failure, Weeks Of Treatment: 1 History: Hypertension, Peripheral Venous Disease, Received Clustered Wound: No Chemotherapy Wound Measurements Length: (cm) 2 Width: (cm) 1.5 Depth: (cm) 0.1 Area: (cm) 2.356 Volume: (cm) 0.236 % Reduction in Area: 0% % Reduction in Volume: 0% Epithelialization: None Tunneling: No Undermining: No Wound Description Classification: Full Thickness Without Exposed Support Structures Wound Margin: Distinct, outline attached Exudate Amount: Medium Exudate Type: Serous Exudate Color: amber Foul Odor After Cleansing: No Slough/Fibrino Yes Wound Bed Granulation Amount: Medium (34-66%) Exposed Structure Granulation Quality: Pink Fascia Exposed: No Necrotic Amount: Medium (34-66%) Fat Layer (Subcutaneous Tissue) Exposed: Yes Necrotic Quality: Adherent Slough Tendon Exposed: No Muscle Exposed: No Joint Exposed: No Bone Exposed: No Treatment Notes Wound #4 (Right, Lateral Lower Leg) 1. Cleanse With Soap and water 2. Periwound Care Moisturizing lotion TCA Cream 3. Primary Dressing Applied Calcium Alginate Ag 4. Secondary Dressing ABD Pad 6. Support Layer Applied 4 layer compression Water quality scientist) Signed: 01/16/2020 5:45:26 PM By: Levan Hurst RN, BSN Entered By: Levan Hurst on 01/15/2020 10:33:43 -------------------------------------------------------------------------------- Wound Assessment Details Patient Name: Date of Service: Nicholas [name] DFO RD K.  01/15/2020 9:45 A M Medical Record Number: [id number] Patient Account Number: [id number] Date of Birth/Sex: Treating RN: [birth year] (74 y.o. [name] Primary Care Dirk Vanaman: [name] Other Clinician: Referring Phelix Fudala: Treating Ravindra Baranek/Extender: [name] in Treatment: 1 Wound Status Wound Number: 5 Primary Lymphedema Etiology: Wound Location: Left, Medial Lower Leg Wound Open Wounding Event: Gradually Appeared Status: Date Acquired: 12/03/2019 Comorbid Lymphedema, Sleep Apnea, Congestive Heart Failure, Weeks Of Treatment: 1 History: Hypertension, Peripheral Venous Disease, Received Clustered Wound: No Chemotherapy Wound Measurements Length: (cm) 0.4 Width: (cm) 0.5 Depth: (cm) 0.1 Area: (cm) 0.157 Volume: (cm) 0.016 % Reduction in Area: 0% % Reduction in Volume: 0% Epithelialization: None Tunneling: No Undermining: No Wound Description Classification: Full Thickness Without Exposed Support Structures Wound Margin: Distinct, outline attached Exudate Amount: Medium Exudate Type: Serous Exudate Color: amber Foul Odor After Cleansing: No Slough/Fibrino Yes Wound Bed Granulation Amount: Large (67-100%) Exposed Structure Granulation Quality: Pink Fascia Exposed: No Necrotic Amount: Small (1-33%) Fat Layer (Subcutaneous Tissue) Exposed: Yes Necrotic Quality: Adherent Slough Tendon Exposed: No Muscle Exposed: No Joint Exposed: No Bone Exposed: No Treatment Notes Wound #5 (Left, Medial Lower Leg) 1. Cleanse With Soap and water 2. Periwound Care Moisturizing lotion TCA Cream 3. Primary  Dressing Applied Calcium Alginate Ag 4. Secondary Dressing ABD Pad 6. Support Layer Applied 4 layer compression Water quality scientist) Signed: 01/16/2020 5:45:26 PM By: Levan Hurst RN, BSN Entered By: Levan Hurst on 01/15/2020 10:34:00 -------------------------------------------------------------------------------- Wound  Assessment Details Patient Name: Date of Service: Nicholas [name] DFO RD K. 01/15/2020 9:45 A M Medical Record Number: [id number] Patient Account Number: [id number] Date of Birth/Sex: Treating RN: April 25, 1946 (74 y.o. [name] Primary Care Taygen Newsome: [name] Other Clinician: Referring Aidee Latimore: Treating Adyn Hoes/Extender: [name] in Treatment: 1 Wound Status Wound Number: 6 Primary Lymphedema Etiology: Wound Location: Left, Lateral Lower Leg Wound Open Wounding Event: Gradually Appeared Status: Date Acquired: 12/03/2019 Comorbid Lymphedema, Sleep Apnea, Congestive Heart Failure, Weeks Of Treatment: 1 History: Hypertension, Peripheral Venous Disease, Received Clustered Wound: No Chemotherapy Wound Measurements Length: (cm) 0.7 Width: (cm) 0.7 Depth: (cm) 0.1 Area: (cm) 0.385 Volume: (cm) 0.038 % Reduction in Area: 0% % Reduction in Volume: 0% Epithelialization: None Tunneling: No Undermining: No Wound Description Classification: Full Thickness Without Exposed Support Structures Wound Margin: Distinct, outline attached Exudate Amount: Medium Exudate Type: Serous Exudate Color: amber Foul Odor After Cleansing: No Slough/Fibrino Yes Wound Bed Granulation Amount: Large (67-100%) Exposed Structure Granulation Quality: Pink Fascia Exposed: No Necrotic Amount: Small (1-33%) Fat Layer (Subcutaneous Tissue) Exposed: Yes Necrotic Quality: Adherent Slough Tendon Exposed: No Muscle Exposed: No Joint Exposed: No Bone Exposed: No Treatment Notes Wound #6 (Left, Lateral Lower Leg) 1. Cleanse With Soap and water 2. Periwound Care Moisturizing lotion TCA Cream 3. Primary Dressing Applied Calcium Alginate Ag 4. Secondary Dressing ABD Pad 6. Support Layer Applied 4 layer compression Water quality scientist) Signed: 01/16/2020 5:45:26 PM By: Levan Hurst RN, BSN Entered By: Levan Hurst on 01/15/2020  10:34:08 -------------------------------------------------------------------------------- Wound Assessment Details Patient Name: Date of Service: Nicholas [name] DFO RD K. 01/15/2020 9:45 A M Medical Record Number: [id number] Patient Account Number: [id number] Date of Birth/Sex: Treating RN: [birth year] (74 y.o. [name] Primary Care Toivo Bordon: [name] Other Clinician: Referring Alucard Fearnow: Treating Kili Gracy/Extender: [name] in Treatment: 1 Wound Status Wound Number: 7 Primary Lymphedema Etiology: Wound Location: Left, Proximal, Lateral Lower Leg Wound Open Wounding Event: Gradually Appeared Status: Date Acquired: 12/03/2019 Comorbid Lymphedema, Sleep Apnea, Congestive Heart Failure, Weeks Of Treatment: 1 History: Hypertension, Peripheral Venous Disease, Received Clustered Wound: No Chemotherapy Wound Measurements Length: (cm) 0.3 Width: (cm) 0.6 Depth: (cm) 0.1 Area: (cm) 0.141 Volume: (cm) 0.014 % Reduction in Area: 0% % Reduction in Volume: 0% Epithelialization: None Tunneling: No Undermining: No Wound Description Classification: Full Thickness Without Exposed Support Structures Wound Margin: Distinct, outline attached Exudate Amount: Medium Exudate Type: Serous Exudate Color: amber Foul Odor After Cleansing: No Slough/Fibrino Yes Wound Bed Granulation Amount: Large (67-100%) Exposed Structure Granulation Quality: Pink Fascia Exposed: No Necrotic Amount: Small (1-33%) Fat Layer (Subcutaneous Tissue) Exposed: Yes Necrotic Quality: Adherent Slough Tendon Exposed: No Muscle Exposed: No Joint Exposed: No Bone Exposed: No Treatment Notes Wound #7 (Left, Proximal, Lateral Lower Leg) 1. Cleanse With Soap and water 2. Periwound Care Moisturizing lotion TCA Cream 3. Primary Dressing Applied Calcium Alginate Ag 4. Secondary Dressing ABD Pad 6. Support Layer Applied 4 layer compression Orthoptist) Signed: 01/16/2020 5:45:26 PM By: Levan Hurst RN, BSN Entered By: Levan Hurst on 01/15/2020 10:34:18 -------------------------------------------------------------------------------- [place] Details Patient Name: Date of Service: Nicholas [name] DFO RD K. 01/15/2020 9:45 A M Medical Record Number: [id number] Patient Account Number: [id number] Date of  Birth/Sex: Treating RN: [birth year] (74 y.o. [name] Primary Care Lawson Isabell: [name] Other Clinician: Referring Shafiq Larch: Treating Eboni Coval/Extender: [name] in Treatment: 1 Vital Signs Time Taken: 09:52 Temperature (F): 98.2 Height (in): 67 Pulse (bpm): 71 Weight (lbs): 250 Respiratory Rate (breaths/min): 20 Body Mass Index (BMI): 39.2 Blood Pressure (mmHg): 139/85 Reference Range: 80 - 120 mg / dl Electronic Signature(s) Signed: 01/16/2020 5:45:26 PM By: Levan Hurst RN, BSN Entered By: Levan Hurst on 01/15/2020 09:52:42

## 2020-01-22 ENCOUNTER — Encounter (HOSPITAL_BASED_OUTPATIENT_CLINIC_OR_DEPARTMENT_OTHER): Payer: PPO | Attending: Internal Medicine | Admitting: Internal Medicine

## 2020-01-22 DIAGNOSIS — L97821 Non-pressure chronic ulcer of other part of left lower leg limited to breakdown of skin: Secondary | ICD-10-CM | POA: Insufficient documentation

## 2020-01-22 DIAGNOSIS — G4733 Obstructive sleep apnea (adult) (pediatric): Secondary | ICD-10-CM | POA: Insufficient documentation

## 2020-01-22 DIAGNOSIS — I890 Lymphedema, not elsewhere classified: Secondary | ICD-10-CM | POA: Diagnosis not present

## 2020-01-22 DIAGNOSIS — I110 Hypertensive heart disease with heart failure: Secondary | ICD-10-CM | POA: Diagnosis not present

## 2020-01-22 DIAGNOSIS — I5032 Chronic diastolic (congestive) heart failure: Secondary | ICD-10-CM | POA: Diagnosis not present

## 2020-01-22 DIAGNOSIS — Z89422 Acquired absence of other left toe(s): Secondary | ICD-10-CM | POA: Insufficient documentation

## 2020-01-22 DIAGNOSIS — Z85820 Personal history of malignant melanoma of skin: Secondary | ICD-10-CM | POA: Diagnosis not present

## 2020-01-22 DIAGNOSIS — G20 Parkinson's disease: Secondary | ICD-10-CM | POA: Diagnosis not present

## 2020-01-22 DIAGNOSIS — L97811 Non-pressure chronic ulcer of other part of right lower leg limited to breakdown of skin: Secondary | ICD-10-CM | POA: Diagnosis not present

## 2020-01-22 DIAGNOSIS — I5030 Unspecified diastolic (congestive) heart failure: Secondary | ICD-10-CM | POA: Insufficient documentation

## 2020-01-23 NOTE — Progress Notes (Signed)
Nicholas [name] ([id number]) , Visit Report for 01/22/2020 HPI Details Patient Name: Date of Service: Nicholas [name] DFO RD K. 01/22/2020 10:30 A M Medical Record Number: [id number] Patient Account Number: [id number] Date of Birth/Sex: Treating RN: 05-Jun-1946 (74 y.o. [name] Primary Care Provider: [name] Other Clinician: Referring Provider: Treating Provider/Extender: [name] in Treatment: 2 History of Present Illness HPI Description: ADMISSION 07/04/2018 This is a 74 year old man who has bilateral lymphedema felt to be secondary to previous treatment for malignant melanoma on his left toes. 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 Malignant melanoma was treated with immunotherapy. He is not had radiation. Sometime after this he developed edema in the left leg that spread into the right leg. He has bilateral compression pumps at home and he uses them twice a day and claims to be quite compliant. He also has a juxta lite type stocking at home which is apparently 36-year-old. He states that he fell in early October dislocating and fracturing his left shoulder. He slept in a recliner with his legs dependent. He was seen and followed at the rehab clinic at [facility] and was having lymphedema wraps done by 1 of the PT who noted his open wounds last week and he has been referred here for management. The patient has 2 open wounds on the right s distal lower leg and one on the left anterior lower leg The patient is not a diabetic. 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 For one reason or another we could not get him set up with home health and he kept the wraps on all week which he generally seem to tolerate. He did not use his compression pumps over the top of the wraps which I told him he could do this week. He comes in with a new rash on both feet which looks like tinea 07/22/2018; much better edema control bilaterally. The wounds on the right lateral calf and left anterior lower leg both look a lot better. There is epithelialization over both surfaces but I think this is fragile and I am going to put him in our compression wraps for another week. He says he is been using his compression pumps at home. He has a constellation of lower extremity compression garments that he is going to bring in next week. In discussion with him I cannot really get a sense of everything he has. I think he was at one point prescribed 30-40 below-knee stockings but he points out he could not get them on. 07/29/2018; the patient arrives in his legs are healed. He has a constellation of stockings none of which I think are going to be that helpful except for the fact that he has external wraparound stockings of some description. He has above-knee stockings but I cannot imagine it would be possible to get these on READMISSION 01/08/2020 Patient is now a 74 year old man. We had him for a short period from December 2019 through January 2020. He has bilateral lower extremity lymphedema which he initially traces to treatment for malignant melanoma 7 years ago. He has been followed at the lymphedema clinic  on [employer] and recently has compression pumps that go up to his mid abdomen. He recently developed superficial wounds bilaterally. They will not manage these in the lymphedema clinic where the services are run by therapy. He has not been using his compression pumps. His significant other cannot get the stockings on his legs and the patient is only limited ability to help because  of Parkinson's disease Past medical history is essentially unchanged. He has sleep apnea, Parkinson's disease at some point labeled his Parkinson's plus, bilateral lymphedema, history of malignant melanoma. He does not have an arterial issue that I am aware of although his previous arterial studies have been noncompressible 7/1; patient we admitted the clinic 2 weeks ago. He has severe bilateral lymphedema. He had wounds predominantly on the left leg but also superficially on the right. He has compression pumps which he says he is using once a day. Electronic Signature(s) Signed: 01/22/2020 5:47:00 PM By: Linton Ham MD Entered By: Linton Ham on 01/22/2020 12:49:56 -------------------------------------------------------------------------------- Physical Exam Details Patient Name: Date of Service: Nicholas [name] DFO RD K. 01/22/2020 10:30 A M Medical Record Number: [id number] Patient Account Number: [id number] Date of Birth/Sex: Treating RN: 1945-09-24 (74 y.o. [name] Primary Care Provider: [name] Other Clinician: Referring Provider: Treating Provider/Extender: [name] in Treatment: 2 Constitutional Patient is hypertensive.. Pulse regular and within target range for patient.Marland Kitchen Respirations regular, non-labored and within target range.. Temperature is normal and within the target range for the patient.Marland Kitchen Appears in no distress. Notes Wound exam Severe uncontrolled lymphedema bilaterally there is some pitting suggesting another additional cause but clearly most of this is lymphedema. On the left he has several denuded open areas including the left anterior left lateral. On the right he has everything mostly closed but the edema is really significant. There is no evidence of surrounding infection On the right lateral leg where his wound was when he first came in here the area is closed but he has a large area of erythema. This is nontender.  Raised edges at the border of this involved skin and normal skin. This makes me wonder whether this is fungal. It is certainly not erysipelas as it is neither warm nor tender Electronic Signature(s) Signed: 01/22/2020 5:47:00 PM By: Linton Ham MD Entered By: Linton Ham on 01/22/2020 12:52:27 -------------------------------------------------------------------------------- Physician Orders Details Patient Name: Date of Service: Nicholas [name] DFO RD K. 01/22/2020 10:30 A M Medical Record Number: [id number] Patient Account Number: [id number] Date of Birth/Sex: Treating RN: 04-13-1946 (74 y.o. 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 - Thursday Return A Nurse Visit: - 1 week for rewrap Dressing Change Frequency Do not change entire dressing for one week. Skin Barriers/Peri-Wound Care ntifungal cream - to flaky area on right lateral leg A TCA Cream or Ointment - mixed with lotion to both legs liberally. Wound Cleansing May shower with protection. - with cast protectors. Primary Wound Dressing Wound #4 Right,Lateral Lower Leg Calcium Alginate with Silver Wound #5 Left,Medial Lower Leg Calcium Alginate with Silver Wound #6 Left,Lateral Lower Leg Calcium Alginate  with Silver Wound #7 Left,Proximal,Lateral Lower Leg Calcium Alginate with Silver Secondary Dressing Wound #4 Right,Lateral Lower Leg ABD pad Wound #5 Left,Medial Lower Leg ABD pad Zetuvit or Kerramax Other: - pad areas to legs in  skin folds for protection Wound #6 Left,Lateral Lower Leg ABD pad Zetuvit or Kerramax Other: - pad areas to legs in skin folds for protection Wound #7 Left,Proximal,Lateral Lower Leg ABD pad Zetuvit or Kerramax Other: - pad areas to legs in skin folds for protection Edema Control 4 layer compression - Bilateral Avoid standing for long periods of time Elevate legs to the level of the heart or above for 30 minutes daily and/or when sitting, a frequency of: - throughout the day. Exercise regularly Segmental Compressive Device. - use lymphedema pumps twice a day an hour each time. Once in the morning and once in the evening. Electronic Signature(s) Signed: 01/22/2020 5:47:00 PM By: Linton Ham MD Signed: 01/23/2020 4:42:40 PM By: Levan Hurst RN, BSN Entered By: Levan Hurst on 01/22/2020 12:12:25 -------------------------------------------------------------------------------- Problem List Details Patient Name: Date of Service: Nicholas [name] DFO RD K. 01/22/2020 10:30 A M Medical Record Number: [id number] Patient Account Number: [id number] Date of Birth/Sex: Treating RN: 11/01/1945 (74 y.o. 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 01/22/2020 10:30 A M Medical Record Number: [id number] Patient Account Number: [id number] Date of Birth/Sex: Treating RN: [birth year] (74 y.o. [name] Primary Care Provider: [name] Other Clinician: Referring Provider: Treating Provider/Extender: [name] in Treatment: 2 Subjective History of Present Illness (HPI) ADMISSION 07/04/2018 This is a 74 year old man who has bilateral lymphedema felt to be secondary to previous treatment for malignant melanoma on his left toes. 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 Malignant melanoma was treated with immunotherapy. He is not had radiation. Sometime after this he developed edema in the left leg that spread into the right leg. He has bilateral compression pumps at home and he uses them twice a day and claims to be quite compliant. He also has a juxta lite type stocking at home which is apparently 60-year-old. He states that he fell in early October dislocating and fracturing his left shoulder. He slept in a recliner with his legs dependent. He was seen and followed at the rehab clinic at [facility] and was having lymphedema wraps done by 1 of the PT who noted his open wounds last week and he has been referred here for management. The patient has 2 open wounds on the right s distal lower leg and one on the left anterior lower leg The patient is not a diabetic. 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 For one reason or another we could not get him set up  with home health and he kept the wraps on all week which he generally seem to tolerate. He did not use his compression pumps over the top of the wraps which I told him he could do this week. He comes in with a new rash on both feet which looks like tinea 07/22/2018; much better edema control bilaterally. The wounds on the right lateral calf and left anterior lower leg both look a lot better. There is epithelialization over both surfaces but I think this is fragile and I am going to put him in our compression wraps for another week. He says he is been using his compression pumps at home. ooHe has a constellation of lower extremity compression garments that he is going to bring in next week. In discussion with him I cannot really get a sense of everything he has. I think he was at one point prescribed 30-40 below-knee stockings but he points out he could not get them on. 07/29/2018; the patient arrives in his legs are healed. He has a constellation of stockings none of which I think are going to be that helpful except for the fact that he has external wraparound stockings of some description. He has above-knee stockings but I cannot imagine it would be possible to get these on READMISSION 01/08/2020 Patient is now a 74 year old man. We had him for a short period from December 2019 through January 2020. He has bilateral lower extremity lymphedema which he initially traces to treatment for malignant melanoma 7 years ago. He has been followed at the lymphedema clinic on [facility] and recently has compression pumps that go up to his mid abdomen. He recently developed superficial wounds bilaterally. They will not manage these in the lymphedema clinic where the services are run by therapy. He has not been using his compression pumps. His significant other cannot get the stockings on his legs and the patient is only limited ability to help because of Parkinson's disease Past medical history is essentially  unchanged. He has sleep apnea, Parkinson's disease at some point labeled his Parkinson's plus, bilateral lymphedema, history of malignant melanoma. He does not have an arterial issue that I am aware of although his previous arterial studies have been noncompressible 7/1; patient we admitted the clinic 2 weeks ago. He has severe bilateral lymphedema. He had wounds predominantly on the left leg but also superficially on the right. He has compression pumps which he says he is using once a day. Objective Constitutional Patient is hypertensive.. Pulse regular and within target range for patient.Marland Kitchen Respirations regular, non-labored and within target range.. Temperature is normal and within the target range for the patient.Marland Kitchen Appears in no distress. Vitals Time Taken: 11:23 AM, Height: 67 in, Weight: 250 lbs, BMI: 39.2, Temperature: 98.5 F, Pulse: 80 bpm, Respiratory Rate: 18 breaths/min, Blood Pressure: 144/88 mmHg. General Notes: Wound exam ooSevere uncontrolled lymphedema bilaterally there is some pitting suggesting another additional cause but clearly most of this is lymphedema. On the left he has several denuded open areas including the left anterior left lateral. On the right he has everything mostly closed but the edema is really significant. There is no evidence of surrounding infection ooOn the right lateral leg where his wound was when he first came in here the area is closed but he has  a large area of erythema. This is nontender. Raised edges at the border of this involved skin and normal skin. This makes me wonder whether this is fungal. It is certainly not erysipelas as it is neither warm nor tender Integumentary (Hair, Skin) Wound #4 status is Open. Original cause of wound was Gradually Appeared. The wound is located on the Right,Lateral Lower Leg. The wound measures 3cm length x 4cm width x 0.1cm depth; 9.425cm^2 area and 0.942cm^3 volume. There is Fat Layer (Subcutaneous Tissue) Exposed  exposed. There is no tunneling or undermining noted. There is a medium amount of serous drainage noted. The wound margin is distinct with the outline attached to the wound base. There is medium (34-66%) pink granulation within the wound bed. There is a medium (34-66%) amount of necrotic tissue within the wound bed including Adherent Slough. Wound #5 status is Open. Original cause of wound was Gradually Appeared. The wound is located on the Left,Medial Lower Leg. The wound measures 3.5cm length x 2.5cm width x 0.1cm depth; 6.872cm^2 area and 0.687cm^3 volume. There is Fat Layer (Subcutaneous Tissue) Exposed exposed. There is no tunneling or undermining noted. There is a medium amount of serous drainage noted. The wound margin is distinct with the outline attached to the wound base. There is large (67-100%) pink granulation within the wound bed. There is a small (1-33%) amount of necrotic tissue within the wound bed including Adherent Slough. Wound #6 status is Open. Original cause of wound was Gradually Appeared. The wound is located on the Left,Lateral Lower Leg. The wound measures 0.6cm length x 0.6cm width x 0.1cm depth; 0.283cm^2 area and 0.028cm^3 volume. There is Fat Layer (Subcutaneous Tissue) Exposed exposed. There is no tunneling or undermining noted. There is a medium amount of serous drainage noted. The wound margin is distinct with the outline attached to the wound base. There is large (67-100%) pink granulation within the wound bed. There is a small (1-33%) amount of necrotic tissue within the wound bed including Adherent Slough. Wound #7 status is Open. Original cause of wound was Gradually Appeared. The wound is located on the Left,Proximal,Lateral Lower Leg. The wound measures 5cm length x 4cm width x 0.1cm depth; 15.708cm^2 area and 1.571cm^3 volume. There is Fat Layer (Subcutaneous Tissue) Exposed exposed. There is no tunneling or undermining noted. There is a medium amount of serous  drainage noted. The wound margin is distinct with the outline attached to the wound base. There is large (67-100%) pink granulation within the wound bed. There is a small (1-33%) amount of necrotic tissue within the wound bed including Adherent Slough. 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 There was a Four Layer Compression Therapy Procedure by Levan Hurst, RN. Post procedure Diagnosis Wound #4: Same as Pre-Procedure Wound #5 Pre-procedure diagnosis of Wound #5 is a Lymphedema located on the Left,Medial Lower Leg . There was a Four Layer Compression Therapy Procedure by Levan Hurst, RN. Post procedure Diagnosis Wound #5: Same as Pre-Procedure Wound #6 Pre-procedure diagnosis of Wound #6 is a Lymphedema located on the Left,Lateral Lower Leg . There was a Four Layer Compression Therapy Procedure by Levan Hurst, RN. Post procedure Diagnosis Wound #6: Same as Pre-Procedure Wound #7 Pre-procedure diagnosis of  Wound #7 is a Lymphedema located on the Left,Proximal,Lateral Lower Leg . There was a Four Layer Compression Therapy Procedure by Levan Hurst, RN. Post procedure Diagnosis Wound #7: Same as Pre-Procedure Plan Follow-up Appointments: Return Appointment in 2 weeks. - Thursday Nurse Visit: - 1 week for rewrap Dressing Change Frequency: Do not change entire dressing for one week. Skin Barriers/Peri-Wound Care: Antifungal cream - to flaky area on right lateral leg TCA Cream or Ointment - mixed with lotion to both legs liberally. Wound Cleansing: May shower with protection. - with cast protectors. Primary Wound Dressing: Wound #4 Right,Lateral Lower  Leg: Calcium Alginate with Silver Wound #5 Left,Medial Lower Leg: Calcium Alginate with Silver Wound #6 Left,Lateral Lower Leg: Calcium Alginate with Silver Wound #7 Left,Proximal,Lateral Lower Leg: Calcium Alginate with Silver Secondary Dressing: Wound #4 Right,Lateral Lower Leg: ABD pad Wound #5 Left,Medial Lower Leg: ABD pad Zetuvit or Kerramax Other: - pad areas to legs in skin folds for protection Wound #6 Left,Lateral Lower Leg: ABD pad Zetuvit or Kerramax Other: - pad areas to legs in skin folds for protection Wound #7 Left,Proximal,Lateral Lower Leg: ABD pad Zetuvit or Kerramax Other: - pad areas to legs in skin folds for protection Edema Control: 4 layer compression - Bilateral Avoid standing for long periods of time Elevate legs to the level of the heart or above for 30 minutes daily and/or when sitting, a frequency of: - throughout the day. Exercise regularly Segmental Compressive Device. - use lymphedema pumps twice a day an hour each time. Once in the morning and once in the evening. 1. We use ketoconazole to the flaky area on the right lateral leg 2. Silver alginate/Kerramax/ABDs under 4-layer compression bilaterally 3. I have asked him to increase his compression pump usage to twice a day. Have my wonder about the compliance. 4. Certainly not doing well on the left leg Electronic Signature(s) Signed: 01/22/2020 5:47:00 PM By: Linton Ham MD Entered By: Linton Ham on 01/22/2020 12:55:11 -------------------------------------------------------------------------------- SuperBill Details Patient Name: Date of Service: Nicholas [name] DFO RD K. 01/22/2020 Medical Record Number: [id number] Patient Account Number: [id number] Date of Birth/Sex: Treating RN: April 11, 1946 (74 y.o. 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 Modifier: Quantity: 1 Physician Procedures Electronic Signature(s) Signed: 01/22/2020 5:47:00 PM By: Linton Ham MD Entered By: Linton Ham on 01/22/2020 12:55:49

## 2020-01-23 NOTE — Progress Notes (Signed)
Nicholas [name] ([id number]) , Visit Report for 01/22/2020 Arrival Information Details Patient Name: Date of Service: Nicholas [name] DFO RD K. 01/22/2020 10:30 A M Medical Record Number: [id number] Patient Account Number: [id number] Date of Birth/Sex: Treating RN: Oct 11, 1945 (73 y.o. Nicholas [name]) Carlene Coria Primary Care Acie Custis: [name] Other Clinician: Referring Diane Hanel: Treating Malonie Tatum/Extender: [name] in Treatment: 2 Visit Information History Since Last Visit All ordered tests and consults were completed: No Patient Arrived: Wheel Chair Added or deleted any medications: No Arrival Time: 11:22 Any new allergies or adverse reactions: No Accompanied By: wife Had a fall or experienced change in No Transfer Assistance: None activities of daily living that may affect Patient Identification Verified: Yes risk of falls: Secondary Verification Process Completed: Yes Signs or symptoms of abuse/neglect since last visito No Patient Requires Transmission-Based Precautions: No Hospitalized since last visit: No Patient Has Alerts: Yes Implantable device outside of the clinic excluding No Patient Alerts: ABI: [place] 07/2018 cellular tissue based products placed in the center since last visit: Has Dressing in Place as Prescribed: Yes Has Compression in Place as Prescribed: Yes Pain Present Now: No Electronic Signature(s) Signed: 01/23/2020 3:42:07 PM By: Carlene Coria RN Entered By: Carlene Coria on 01/22/2020 11:23:24 -------------------------------------------------------------------------------- Compression Therapy Details Patient Name: Date of Service: Nicholas [name] DFO RD K. 01/22/2020 10:30 A M Medical Record Number: [id number] Patient Account Number: [id number] Date of Birth/Sex: Treating RN: 28-Oct-1945 (74 y.o. Nicholas [name] Primary Care Prince Couey: [name] Other Clinician: Referring Makesha Belitz: Treating Tylene Quashie/Extender: [name] in Treatment: 2 Compression Therapy Performed for Wound Assessment: Wound #4 Right,Lateral Lower Leg Performed By: Clinician Levan Hurst, RN Compression Type: Four Layer Post Procedure Diagnosis Same as Pre-procedure Electronic Signature(s) Signed: 01/23/2020 4:42:40 PM By: Levan Hurst RN, BSN Entered By: Levan Hurst on 01/22/2020 12:13:03 -------------------------------------------------------------------------------- Compression Therapy Details Patient Name: Date of Service: Nicholas [name] DFO RD K. 01/22/2020 10:30 A M Medical Record Number: [id number] Patient Account Number: [id number] Date of Birth/Sex: Treating RN: 1945/11/28 (74 y.o. Nicholas [name] Primary Care Dusty Wagoner: [name] Other Clinician: Referring Ethelda Deangelo: Treating Weslynn Ke/Extender: [name] in Treatment: 2 Compression Therapy Performed for Wound Assessment: Wound #5 Left,Medial Lower Leg Performed By: Clinician Levan Hurst, RN Compression Type: Four Layer Post Procedure Diagnosis Same as Pre-procedure Electronic Signature(s) Signed: 01/23/2020 4:42:40 PM By: Levan Hurst RN, BSN Entered By: Levan Hurst on 01/22/2020 12:13:04 -------------------------------------------------------------------------------- Compression Therapy Details Patient Name: Date of Service: Nicholas [name] DFO RD K. 01/22/2020 10:30 A M Medical Record Number: [id number] Patient Account Number: [id number] Date of Birth/Sex: Treating RN: [birth year] (74 y.o. Nicholas [name] Primary Care Marsa Matteo: [name] Other Clinician: Referring Cranston Koors: Treating Jeanna Giuffre/Extender: [name] in Treatment: 2 Compression Therapy Performed for Wound Assessment: Wound #6 Left,Lateral Lower Leg Performed By: Clinician Levan Hurst, RN Compression Type: Four Layer Post Procedure Diagnosis Same as Pre-procedure Electronic Signature(s) Signed: 01/23/2020  4:42:40 PM By: Levan Hurst RN, BSN Entered By: Levan Hurst on 01/22/2020 12:13:04 -------------------------------------------------------------------------------- Compression Therapy Details Patient Name: Date of Service: Nicholas [name] DFO RD K. 01/22/2020 10:30 A M Medical Record Number: [id number] Patient Account Number: [id number] Date of Birth/Sex: Treating RN: 1945/11/29 (74 y.o. Nicholas [name] Primary Care Avrohom Mckelvin: [name] Other Clinician: Referring Hung Rhinesmith: Treating Daryn Pisani/Extender: [name] in Treatment: 2 Compression Therapy Performed for Wound Assessment: Wound #7 Left,Proximal,Lateral Lower Leg Performed By: Clinician Levan Hurst,  RN Compression Type: Four Layer Post Procedure Diagnosis Same as Pre-procedure Electronic Signature(s) Signed: 01/23/2020 4:42:40 PM By: Levan Hurst RN, BSN Entered By: Levan Hurst on 01/22/2020 12:13:04 -------------------------------------------------------------------------------- Encounter Discharge Information Details Patient Name: Date of Service: Nicholas [name] DFO RD K. 01/22/2020 10:30 A M Medical Record Number: [id number] Patient Account Number: [id number] Date of Birth/Sex: Treating RN: 1945-11-18 (74 y.o. Nicholas [name] Primary Care Maryem Shuffler: [name] Other Clinician: Referring Kelley Knoth: Treating Grady Lucci/Extender: [name] in Treatment: 2 Encounter Discharge Information Items Discharge Condition: Stable Ambulatory Status: Wheelchair Discharge Destination: Home Transportation: Private Auto Accompanied By: spouse Schedule Follow-up Appointment: Yes Clinical Summary of Care: Patient Declined Electronic Signature(s) Signed: 01/22/2020 6:06:33 PM By: Baruch Gouty RN, BSN Entered By: Baruch Gouty on 01/22/2020 14:03:09 -------------------------------------------------------------------------------- Lower Extremity Assessment  Details Patient Name: Date of Service: Nicholas [name] DFO RD K. 01/22/2020 10:30 A M Medical Record Number: [id number] Patient Account Number: [id number] Date of Birth/Sex: Treating RN: 1946-02-14 (73 y.o. Nicholas [name]) Carlene Coria Primary Care Hanz Winterhalter: [name] Other Clinician: Referring Blaklee Shores: Treating Khrystal Jeanmarie/Extender: [name] in Treatment: 2 Edema Assessment Assessed: [name]: No] [name]: No] Edema: [Left: Yes] [Right: Yes] Calf Left: Right: Point of Measurement: 40 cm From Medial Instep 54 cm 55 cm Ankle Left: Right: Point of Measurement: 19 cm From Medial Instep 41 cm 40 cm Electronic Signature(s) Signed: 01/23/2020 3:42:07 PM By: Carlene Coria RN Entered By: Carlene Coria on 01/22/2020 11:38:01 -------------------------------------------------------------------------------- Multi Wound Chart Details Patient Name: Date of Service: Nicholas [name] DFO RD K. 01/22/2020 10:30 A M Medical Record Number: [id number] Patient Account Number: [id number] Date of Birth/Sex: Treating RN: [birth year] (74 y.o. Nicholas [name] Primary Care Charlsie Fleeger: [name] Other Clinician: Referring Jasira Robinson: Treating Zoeie Ritter/Extender: [name] in Treatment: 2 Vital Signs Height(in): 21 Pulse(bpm): 34 Weight(lbs): 250 Blood Pressure(mmHg): 144/88 Body Mass Index(BMI): 39 Temperature(F): 98.5 Respiratory Rate(breaths/min): 18 Photos: [4:No Photos Right, Lateral Lower Leg] [5:Left, Medial Lower Leg] [6:No Photos Left, Lateral Lower Leg] Wound Location: [4:Gradually Appeared] [5:Gradually Appeared] [6:Gradually Appeared] Wounding Event: [4:Lymphedema] [5:Lymphedema] [6:Lymphedema] Primary Etiology: [4:Lymphedema, Sleep Apnea,] [5:Lymphedema, Sleep Apnea,] [6:Lymphedema, Sleep Apnea,] Comorbid History: [4:Congestive Heart Failure, Hypertension, Peripheral Venous Disease, Received Chemotherapy 12/03/2019] [5:Congestive Heart Failure,  Hypertension, Peripheral Venous Disease, Received Chemotherapy 12/03/2019] [6:Congestive Heart Failure,  Hypertension, Peripheral Venous Disease, Received Chemotherapy 12/03/2019] Date Acquired: [4:2] [5:2] [6:2] Weeks of Treatment: [4:Open] [5:Open] [6:Open] Wound Status: [4:3x4x0.1] [5:3.5x2.5x0.1] [6:0.6x0.6x0.1] Measurements L x W x D (cm) [4:9.425] [5:6.872] [6:0.283] A (cm) : rea [4:0.942] [5:0.687] [6:0.028] Volume (cm) : [4:-300.00%] [5:-4277.10%] [6:26.50%] % Reduction in Area: [4:-299.20%] [5:-4193.70%] [6:26.30%] % Reduction in Volume: [4:Full Thickness Without Exposed] [5:Full Thickness Without Exposed] [6:Full Thickness Without Exposed] Classification: [4:Support Structures Medium] [5:Support Structures Medium] [6:Support Structures Medium] Exudate Amount: [4:Serous] [5:Serous] [6:Serous] Exudate Type: [4:amber] [5:amber] [6:amber] Exudate Color: [4:Distinct, outline attached] [5:Distinct, outline attached] [6:Distinct, outline attached] Wound Margin: [4:Medium (34-66%)] [5:Large (67-100%)] [6:Large (67-100%)] Granulation Amount: [4:Pink] [5:Pink] [6:Pink] Granulation Quality: [4:Medium (34-66%)] [5:Small (1-33%)] [6:Small (1-33%)] Necrotic Amount: [4:Fat Layer (Subcutaneous Tissue)] [5:Fat Layer (Subcutaneous Tissue)] [6:Fat Layer (Subcutaneous Tissue)] Exposed Structures: [4:Exposed: Yes Fascia: No Tendon: No Muscle: No Joint: No Bone: No None] [5:Exposed: Yes Fascia: No Tendon: No Muscle: No Joint: No Bone: No None] [6:Exposed: Yes Fascia: No Tendon: No Muscle: No Joint: No Bone: No None] Epithelialization: [4:Compression Therapy] [5:Compression Therapy] [6:Compression Therapy] Wound Number: 7 N/A N/A Photos: No Photos N/A N/A Left, Proximal, Lateral Lower Leg N/A  N/A Wound Location: Gradually Appeared N/A N/A Wounding Event: Lymphedema N/A N/A Primary Etiology: Lymphedema, Sleep Apnea, N/A N/A Comorbid History: Congestive Heart Failure, Hypertension, Peripheral  Venous Disease, Received Chemotherapy 12/03/2019 N/A N/A Date Acquired: 2 N/A N/A Weeks of Treatment: Open N/A N/A Wound Status: 5x4x0.1 N/A N/A Measurements L x W x D (cm) 15.708 N/A N/A A (cm) : rea 1.571 N/A N/A Volume (cm) : -11040.40% N/A N/A % Reduction in Area: -11121.40% N/A N/A % Reduction in Volume: Full Thickness Without Exposed N/A N/A Classification: Support Structures Medium N/A N/A Exudate Amount: Serous N/A N/A Exudate Type: amber N/A N/A Exudate Color: Distinct, outline attached N/A N/A Wound Margin: Large (67-100%) N/A N/A Granulation Amount: Pink N/A N/A Granulation Quality: Small (1-33%) N/A N/A Necrotic Amount: Fat Layer (Subcutaneous Tissue) N/A N/A Exposed Structures: Exposed: Yes Fascia: No Tendon: No Muscle: No Joint: No Bone: No None N/A N/A Epithelialization: Compression Therapy N/A N/A Procedures Performed: Treatment Notes Electronic Signature(s) Signed: 01/22/2020 5:47:00 PM By: Linton Ham MD Signed: 01/23/2020 4:42:40 PM By: Levan Hurst RN, BSN Entered By: Linton Ham on 01/22/2020 12:48:59 -------------------------------------------------------------------------------- Pain Assessment Details Patient Name: Date of Service: Nicholas [name] DFO RD K. 01/22/2020 10:30 A M Medical Record Number: [id number] Patient Account Number: [id number] Date of Birth/Sex: Treating RN: [birth year] (73 y.o. [name] Primary Care Tashae Inda: [name] Other Clinician: Referring Nirav Sweda: Treating Jacqualynn Parco/Extender: [name] in Treatment: 2 Active Problems Location of Pain Severity and Description of Pain Patient Has Paino No Site Locations Pain Management and Medication Current Pain Management: Electronic Signature(s) Signed: 01/23/2020 3:42:07 PM By: Carlene Coria RN Entered By: Carlene Coria on 01/22/2020  11:23:50 -------------------------------------------------------------------------------- Patient/Caregiver Education Details Patient Name: Date of Service: Nicholas [name] DFO RD K. 7/1/2021andnbsp10:30 A M Medical Record Number: [id number] Patient Account Number: [id number] Date of Birth/Gender: Treating RN: 04-Aug-1945 (74 y.o. Nicholas [name] Primary Care Physician: [name] Other Clinician: Referring Physician: Treating Physician/Extender: [name] in Treatment: 2 Education Assessment Education Provided To: Patient Education Topics Provided Wound/Skin Impairment: Methods: Explain/Verbal Responses: State content correctly Electronic Signature(s) Signed: 01/23/2020 4:42:40 PM By: Levan Hurst RN, BSN Entered By: Levan Hurst on 01/22/2020 11:44:48 -------------------------------------------------------------------------------- Wound Assessment Details Patient Name: Date of Service: Nicholas [name] DFO RD K. 01/22/2020 10:30 A M Medical Record Number: [id number] Patient Account Number: [id number] Date of Birth/Sex: Treating RN: 11/12/1945 (73 y.o. Nicholas [name]) Carlene Coria Primary Care Leaha Cuervo: [name] Other Clinician: Referring Onie Kasparek: Treating Robey Massmann/Extender: [name] in Treatment: 2 Wound Status Wound Number: 4 Primary Lymphedema Etiology: Wound Location: Right, Lateral Lower Leg Wound Open Wounding Event: Gradually Appeared Status: Date Acquired: 12/03/2019 Comorbid Lymphedema, Sleep Apnea, Congestive Heart Failure, Weeks Of Treatment: 2 History: Hypertension, Peripheral Venous Disease, Received Chemotherapy Clustered Wound: No Wound Measurements Length: (cm) 3 Width: (cm) 4 Depth: (cm) 0.1 Area: (cm) 9.425 Volume: (cm) 0.942 % Reduction in Area: -300% % Reduction in Volume: -299.2% Epithelialization: None Tunneling: No Undermining: No Wound Description Classification: Full Thickness  Without Exposed Support Structures Wound Margin: Distinct, outline attached Exudate Amount: Medium Exudate Type: Serous Exudate Color: amber Foul Odor After Cleansing: No Slough/Fibrino Yes Wound Bed Granulation Amount: Medium (34-66%) Exposed Structure Granulation Quality: Pink Fascia Exposed: No Necrotic Amount: Medium (34-66%) Fat Layer (Subcutaneous Tissue) Exposed: Yes Necrotic Quality: Adherent Slough Tendon Exposed: No Muscle Exposed: No Joint Exposed: No Bone Exposed: No Treatment Notes Wound #4 (Right, Lateral Lower Leg) 2. Periwound Care Antifungal  cream Moisturizing lotion 4. Secondary Dressing ABD Pad 6. Support Layer Applied 4 layer compression wrap Electronic Signature(s) Signed: 01/23/2020 3:42:07 PM By: Carlene Coria RN Entered By: Carlene Coria on 01/22/2020 11:39:16 -------------------------------------------------------------------------------- Wound Assessment Details Patient Name: Date of Service: Nicholas [name] DFO RD K. 01/22/2020 10:30 A M Medical Record Number: [id number] Patient Account Number: [id number] Date of Birth/Sex: Treating RN: June 20, 1946 (73 y.o. Nicholas [name]) Carlene Coria Primary Care Grover Woodfield: [name] Other Clinician: Referring Dayna Geurts: Treating Gerik Coberly/Extender: [name] in Treatment: 2 Wound Status Wound Number: 5 Primary Lymphedema Etiology: Wound Location: Left, Medial Lower Leg Wound Open Wounding Event: Gradually Appeared Status: Date Acquired: 12/03/2019 Comorbid Lymphedema, Sleep Apnea, Congestive Heart Failure, Weeks Of Treatment: 2 History: Hypertension, Peripheral Venous Disease, Received Chemotherapy Clustered Wound: No Wound Measurements Length: (cm) 3.5 Width: (cm) 2.5 Depth: (cm) 0.1 Area: (cm) 6.872 Volume: (cm) 0.687 % Reduction in Area: -4277.1% % Reduction in Volume: -4193.7% Epithelialization: None Tunneling: No Undermining: No Wound Description Classification: Full  Thickness Without Exposed Support Structures Wound Margin: Distinct, outline attached Exudate Amount: Medium Exudate Type: Serous Exudate Color: amber Foul Odor After Cleansing: No Slough/Fibrino Yes Wound Bed Granulation Amount: Large (67-100%) Exposed Structure Granulation Quality: Pink Fascia Exposed: No Necrotic Amount: Small (1-33%) Fat Layer (Subcutaneous Tissue) Exposed: Yes Necrotic Quality: Adherent Slough Tendon Exposed: No Muscle Exposed: No Joint Exposed: No Bone Exposed: No Treatment Notes Wound #5 (Left, Medial Lower Leg) 2. Periwound Care Antifungal powder Moisturizing lotion 3. Primary Dressing Applied Calcium Alginate Ag 4. Secondary Dressing ABD Pad Kerramax/Xtrasorb 6. Support Layer Applied 4 layer compression wrap Notes zetuvit Electronic Signature(s) Signed: 01/23/2020 3:42:07 PM By: Carlene Coria RN Entered By: Carlene Coria on 01/22/2020 11:39:27 -------------------------------------------------------------------------------- Wound Assessment Details Patient Name: Date of Service: Nicholas [name] DFO RD K. 01/22/2020 10:30 A M Medical Record Number: [id number] Patient Account Number: [id number] Date of Birth/Sex: Treating RN: 1945/12/11 (73 y.o. Nicholas [name]) Carlene Coria Primary Care Ramiah Helfrich: [name] Other Clinician: Referring Osmel Dykstra: Treating Audrionna Lampton/Extender: [name] in Treatment: 2 Wound Status Wound Number: 6 Primary Lymphedema Etiology: Wound Location: Left, Lateral Lower Leg Wound Open Wounding Event: Gradually Appeared Status: Date Acquired: 12/03/2019 Comorbid Lymphedema, Sleep Apnea, Congestive Heart Failure, Weeks Of Treatment: 2 History: Hypertension, Peripheral Venous Disease, Received Chemotherapy Clustered Wound: No Wound Measurements Length: (cm) 0.6 Width: (cm) 0.6 Depth: (cm) 0.1 Area: (cm) 0.283 Volume: (cm) 0.028 % Reduction in Area: 26.5% % Reduction in Volume: 26.3% Epithelialization:  None Tunneling: No Undermining: No Wound Description Classification: Full Thickness Without Exposed Support Structures Wound Margin: Distinct, outline attached Exudate Amount: Medium Exudate Type: Serous Exudate Color: amber Foul Odor After Cleansing: No Slough/Fibrino Yes Wound Bed Granulation Amount: Large (67-100%) Exposed Structure Granulation Quality: Pink Fascia Exposed: No Necrotic Amount: Small (1-33%) Fat Layer (Subcutaneous Tissue) Exposed: Yes Necrotic Quality: Adherent Slough Tendon Exposed: No Muscle Exposed: No Joint Exposed: No Bone Exposed: No Treatment Notes Wound #6 (Left, Lateral Lower Leg) 2. Periwound Care Antifungal powder Moisturizing lotion 3. Primary Dressing Applied Calcium Alginate Ag 4. Secondary Dressing ABD Pad Kerramax/Xtrasorb 6. Support Layer Applied 4 layer compression wrap Notes zetuvit Electronic Signature(s) Signed: 01/23/2020 3:42:07 PM By: Carlene Coria RN Entered By: Carlene Coria on 01/22/2020 11:40:09 -------------------------------------------------------------------------------- Wound Assessment Details Patient Name: Date of Service: Nicholas [name] DFO RD K. 01/22/2020 10:30 A M Medical Record Number: [id number] Patient Account Number: [id number] Date of Birth/Sex: Treating RN: 1946/01/01 (73 y.o. Nicholas [name]) Carlene Coria Primary Care Danamarie Minami: Yong Channel,  Annie Main Other Clinician: Referring Cordie Buening: Treating Modesty Rudy/Extender: [name] in Treatment: 2 Wound Status Wound Number: 7 Primary Lymphedema Etiology: Wound Location: Left, Proximal, Lateral Lower Leg Wound Open Wounding Event: Gradually Appeared Status: Date Acquired: 12/03/2019 Comorbid Lymphedema, Sleep Apnea, Congestive Heart Failure, Weeks Of Treatment: 2 History: Hypertension, Peripheral Venous Disease, Received Chemotherapy Clustered Wound: No Wound Measurements Length: (cm) 5 Width: (cm) 4 Depth: (cm) 0.1 Area: (cm) 15.708 Volume: (cm)  1.571 % Reduction in Area: -11040.4% % Reduction in Volume: -11121.4% Epithelialization: None Tunneling: No Undermining: No Wound Description Classification: Full Thickness Without Exposed Support Structures Wound Margin: Distinct, outline attached Exudate Amount: Medium Exudate Type: Serous Exudate Color: amber Foul Odor After Cleansing: No Slough/Fibrino Yes Wound Bed Granulation Amount: Large (67-100%) Exposed Structure Granulation Quality: Pink Fascia Exposed: No Necrotic Amount: Small (1-33%) Fat Layer (Subcutaneous Tissue) Exposed: Yes Necrotic Quality: Adherent Slough Tendon Exposed: No Muscle Exposed: No Joint Exposed: No Bone Exposed: No Treatment Notes Wound #7 (Left, Proximal, Lateral Lower Leg) 2. Periwound Care Antifungal powder Moisturizing lotion 3. Primary Dressing Applied Calcium Alginate Ag 4. Secondary Dressing ABD Pad Kerramax/Xtrasorb 6. Support Layer Applied 4 layer compression wrap Notes zetuvit Electronic Signature(s) Signed: 01/23/2020 3:42:07 PM By: Carlene Coria RN Entered By: Carlene Coria on 01/22/2020 11:40:17 -------------------------------------------------------------------------------- Vitals Details Patient Name: Date of Service: Nicholas [name] DFO RD K. 01/22/2020 10:30 A M Medical Record Number: [id number] Patient Account Number: [id number] Date of Birth/Sex: Treating RN: April 20, 1946 (73 y.o. Nicholas [name]) Carlene Coria Primary Care Alynn Ellithorpe: [name] Other Clinician: Referring Chenay Nesmith: Treating Ermias Tomeo/Extender: [name] in Treatment: 2 Vital Signs Time Taken: 11:23 Temperature (F): 98.5 Height (in): 67 Pulse (bpm): 80 Weight (lbs): 250 Respiratory Rate (breaths/min): 18 Body Mass Index (BMI): 39.2 Blood Pressure (mmHg): 144/88 Reference Range: 80 - 120 mg / dl Electronic Signature(s) Signed: 01/23/2020 3:42:07 PM By: Carlene Coria RN Entered By: Carlene Coria on 01/22/2020 11:23:43

## 2020-01-29 ENCOUNTER — Encounter (HOSPITAL_BASED_OUTPATIENT_CLINIC_OR_DEPARTMENT_OTHER): Payer: PPO | Admitting: Internal Medicine

## 2020-01-29 DIAGNOSIS — L97811 Non-pressure chronic ulcer of other part of right lower leg limited to breakdown of skin: Secondary | ICD-10-CM | POA: Diagnosis not present

## 2020-02-03 DIAGNOSIS — D2272 Melanocytic nevi of left lower limb, including hip: Secondary | ICD-10-CM | POA: Diagnosis not present

## 2020-02-03 DIAGNOSIS — L918 Other hypertrophic disorders of the skin: Secondary | ICD-10-CM | POA: Diagnosis not present

## 2020-02-03 DIAGNOSIS — D2239 Melanocytic nevi of other parts of face: Secondary | ICD-10-CM | POA: Diagnosis not present

## 2020-02-03 DIAGNOSIS — L821 Other seborrheic keratosis: Secondary | ICD-10-CM | POA: Diagnosis not present

## 2020-02-03 DIAGNOSIS — Z85828 Personal history of other malignant neoplasm of skin: Secondary | ICD-10-CM | POA: Diagnosis not present

## 2020-02-03 DIAGNOSIS — Z85820 Personal history of malignant melanoma of skin: Secondary | ICD-10-CM | POA: Diagnosis not present

## 2020-02-03 DIAGNOSIS — D225 Melanocytic nevi of trunk: Secondary | ICD-10-CM | POA: Diagnosis not present

## 2020-02-03 DIAGNOSIS — D1801 Hemangioma of skin and subcutaneous tissue: Secondary | ICD-10-CM | POA: Diagnosis not present

## 2020-02-03 NOTE — Progress Notes (Addendum)
[name] ([id number]) , Visit Report for 01/29/2020 SuperBill Details Patient Name: Date of Service: Nicholas Junes DFO RD K. 01/29/2020 Medical Record Number: [id number] Patient Account Number: [id number] Date of Birth/Sex: Treating RN: 02-Jul-1946 (73 y.o. [name]) Carlene Coria Primary Care Provider: [name] Other Clinician: Referring Provider: Treating Provider/Extender: [name] in Treatment: 3 Diagnosis Coding ICD-10 Codes Code Description I89.0 Lymphedema, not elsewhere classified L97.811 Non-pressure chronic ulcer of other part of right lower leg limited to breakdown of skin L97.821 Non-pressure chronic ulcer of other part of left lower leg limited to breakdown of skin Facility Procedures CPT4 Description Modifier Quantity Code [id number] BILATERAL: Application of multi-layer venous compression system; leg (below knee), including ankle and 1 foot. Electronic Signature(s) Signed: 01/29/2020 5:28:43 PM By: Linton Ham MD Signed: 02/02/2020 5:13:16 PM By: Carlene Coria RN Entered By: Carlene Coria on 01/29/2020 14:07:46

## 2020-02-03 NOTE — Progress Notes (Addendum)
DEONTRE ALLSUP ([id number]) , Visit Report for 01/29/2020 Arrival Information Details Patient Name: Date of Service: Nicholas [name] DFO RD K. 01/29/2020 1:00 PM Medical Record Number: [id number] Patient Account Number: [id number] Date of Birth/Sex: Treating RN: 10/06/1945 (74 y.o. Nicholas [name]) Carlene Coria Primary Care Nicholas [name]: [name] Other Clinician: Referring Kizzy Olafson: Treating Milan Clare/Extender: [name] in Treatment: 3 Visit Information History Since Last Visit All ordered tests and consults were completed: No Patient Arrived: Wheel Chair Added or deleted any medications: No Arrival Time: 13:15 Any new allergies or adverse reactions: No Accompanied By: wife Had a fall or experienced change in No Transfer Assistance: None activities of daily living that may affect Patient Identification Verified: Yes risk of falls: Secondary Verification Process Completed: Yes Signs or symptoms of abuse/neglect since last visito No Patient Requires Transmission-Based Precautions: No Hospitalized since last visit: No Patient Has Alerts: Yes Implantable device outside of the clinic excluding No Patient Alerts: ABI: [place] 07/2018 cellular tissue based products placed in the center since last visit: Has Dressing in Place as Prescribed: Yes Has Compression in Place as Prescribed: Yes Pain Present Now: No Electronic Signature(s) Signed: 02/02/2020 5:13:16 PM By: Carlene Coria RN Entered By: Carlene Coria on 01/29/2020 13:59:14 -------------------------------------------------------------------------------- Compression Therapy Details Patient Name: Date of Service: Nicholas [name] DFO RD K. 01/29/2020 1:00 PM Medical Record Number: [id number] Patient Account Number: [id number] Date of Birth/Sex: Treating RN: 05/25/1946 (74 y.o. Nicholas [name]) Carlene Coria Primary Care Nicholas [name]: [name] Other Clinician: Referring Myrta Mercer: Treating Quientin Jent/Extender: [name] in Treatment: 3 Compression Therapy Performed for Wound Assessment: Wound #4 Right,Lateral Lower Leg Performed By: Clinician Carlene Coria, RN Compression Type: Four Layer Electronic Signature(s) Signed: 02/02/2020 5:13:16 PM By: Carlene Coria RN Entered By: Carlene Coria on 01/29/2020 14:03:27 -------------------------------------------------------------------------------- Compression Therapy Details Patient Name: Date of Service: Nicholas [name] DFO RD K. 01/29/2020 1:00 PM Medical Record Number: [id number] Patient Account Number: [id number] Date of Birth/Sex: Treating RN: [birth year] (74 y.o. Nicholas [name] Primary Care Ashley Bultema: [name] Other Clinician: Referring Manika Hast: Treating Jaxiel Kines/Extender: [name] in Treatment: 3 Compression Therapy Performed for Wound Assessment: Wound #5 Left,Medial Lower Leg Performed By: Clinician Carlene Coria, RN Compression Type: Four Layer Electronic Signature(s) Signed: 02/02/2020 5:13:16 PM By: Carlene Coria RN Entered By: Carlene Coria on 01/29/2020 14:03:28 -------------------------------------------------------------------------------- Compression Therapy Details Patient Name: Date of Service: Nicholas [name] DFO RD K. 01/29/2020 1:00 PM Medical Record Number: [id number] Patient Account Number: [id number] Date of Birth/Sex: Treating RN: 1946/06/27 (74 y.o. Nicholas [name] Primary Care Malaak Stach: [name] Other Clinician: Referring Nisreen Guise: Treating Rabiah Goeser/Extender: [name] in Treatment: 3 Compression Therapy Performed for Wound Assessment: Wound #6 Left,Lateral Lower Leg Performed By: Clinician Carlene Coria, RN Compression Type: Four Layer Electronic Signature(s) Signed: 02/02/2020 5:13:16 PM By: Carlene Coria RN Entered By: Carlene Coria on 01/29/2020 14:03:29 -------------------------------------------------------------------------------- Compression  Therapy Details Patient Name: Date of Service: Nicholas [name] DFO RD K. 01/29/2020 1:00 PM Medical Record Number: [id number] Patient Account Number: [id number] Date of Birth/Sex: Treating RN: March 25, 1946 (74 y.o. Nicholas [name] Primary Care Nicholas [name]: [name] Other Clinician: Referring Ailyn Gladd: Treating Daphna Lafuente/Extender: [name] in Treatment: 3 Compression Therapy Performed for Wound Assessment: Wound #7 Left,Proximal,Lateral Lower Leg Performed By: Clinician Carlene Coria, RN Compression Type: Four Layer Electronic Signature(s) Signed: 02/02/2020 5:13:16 PM By: Carlene Coria RN Entered By: Carlene Coria on 01/29/2020 14:03:30 -------------------------------------------------------------------------------- Encounter Discharge Information  Details Patient Name: Date of Service: Nicholas [name] DFO RD K. 01/29/2020 1:00 PM Medical Record Number: [id number] Patient Account Number: [id number] Date of Birth/Sex: Treating RN: [birth year] (74 y.o. Nicholas [name]) Carlene Coria Primary Care Nicholas [name]: [name] Other Clinician: Referring Arah Aro: Treating Shawnise Peterkin/Extender: [name] in Treatment: 3 Encounter Discharge Information Items Discharge Condition: Stable Ambulatory Status: Wheelchair Discharge Destination: Home Transportation: Private Auto Accompanied By: wife Schedule Follow-up Appointment: Yes Clinical Summary of Care: Patient Declined Electronic Signature(s) Signed: 02/02/2020 5:13:16 PM By: Carlene Coria RN Entered By: Carlene Coria on 01/29/2020 14:07:21 -------------------------------------------------------------------------------- Patient/Caregiver Education Details Patient Name: Date of Service: Nicholas [name] DFO RD K. 7/8/2021andnbsp1:00 PM Medical Record Number: [id number] Patient Account Number: [id number] Date of Birth/Gender: Treating RN: 05/10/1946 (74 y.o. Nicholas [name]) Carlene Coria Primary Care Physician: [name] Other Clinician: Referring Physician: Treating Physician/Extender: [name] in Treatment: 3 Education Assessment Education Provided To: Patient Education Topics Provided Wound/Skin Impairment: Methods: Explain/Verbal Responses: State content correctly Electronic Signature(s) Signed: 02/02/2020 5:13:16 PM By: Carlene Coria RN Entered By: Carlene Coria on 01/29/2020 14:06:50 -------------------------------------------------------------------------------- Wound Assessment Details Patient Name: Date of Service: Nicholas [name] DFO RD K. 01/29/2020 1:00 PM Medical Record Number: [id number] Patient Account Number: [id number] Date of Birth/Sex: Treating RN: Jul 15, 1946 (73 y.o. Nicholas [name]) Carlene Coria Primary Care Cleven Jansma: [name] Other Clinician: Referring Laasia Arcos: Treating Miamor Ayler/Extender: [name] in Treatment: 3 Wound Status Wound Number: 4 Primary Lymphedema Etiology: Wound Location: Right, Lateral Lower Leg Wound Open Wounding Event: Gradually Appeared Wounding Event: Gradually Appeared Status: Date Acquired: 12/03/2019 Comorbid Lymphedema, Sleep Apnea, Congestive Heart Failure, Weeks Of Treatment: 3 History: Hypertension, Peripheral Venous Disease, Received Clustered Wound: No Chemotherapy Wound Measurements Length: (cm) 3 Width: (cm) 4 Depth: (cm) 0.1 Area: (cm) 9.425 Volume: (cm) 0.942 % Reduction in Area: -300% % Reduction in Volume: -299.2% Epithelialization: None Tunneling: No Undermining: No Wound Description Classification: Full Thickness Without Exposed Support Structures Wound Margin: Distinct, outline attached Exudate Amount: Medium Exudate Type: Serous Exudate Color: amber Foul Odor After Cleansing: No Slough/Fibrino Yes Wound Bed Granulation Amount: Medium (34-66%) Exposed Structure Granulation Quality: Pink Fascia Exposed: No Necrotic Amount: Medium (34-66%) Fat Layer  (Subcutaneous Tissue) Exposed: Yes Necrotic Quality: Adherent Slough Tendon Exposed: No Muscle Exposed: No Joint Exposed: No Bone Exposed: No Treatment Notes Wound #4 (Right, Lateral Lower Leg) 1. Cleanse With Wound Cleanser Soap and water 3. Primary Dressing Applied Calcium Alginate Ag 4. Secondary Dressing Dry Gauze 6. Support Layer Applied 4 layer compression Water quality scientist) Signed: 02/02/2020 5:13:16 PM By: Carlene Coria RN Entered By: Carlene Coria on 01/29/2020 14:01:28 -------------------------------------------------------------------------------- Wound Assessment Details Patient Name: Date of Service: Nicholas [name] DFO RD K. 01/29/2020 1:00 PM Medical Record Number: [id number] Patient Account Number: [id number] Date of Birth/Sex: Treating RN: 25-Jun-1946 (73 y.o. Nicholas [name]) Carlene Coria Primary Care Hazim Treadway: [name] Other Clinician: Referring Mychal Durio: Treating Samir Ishaq/Extender: [name] in Treatment: 3 Wound Status Wound Number: 5 Primary Lymphedema Etiology: Wound Location: Left, Medial Lower Leg Wound Open Wounding Event: Gradually Appeared Status: Date Acquired: 12/03/2019 Comorbid Lymphedema, Sleep Apnea, Congestive Heart Failure, Weeks Of Treatment: 3 History: Hypertension, Peripheral Venous Disease, Received Clustered Wound: No Chemotherapy Wound Measurements Length: (cm) 3.5 Width: (cm) 2.5 Depth: (cm) 0.1 Area: (cm) 6.872 Volume: (cm) 0.687 % Reduction in Area: -4277.1% % Reduction in Volume: -4193.7% Epithelialization: None Tunneling: No Undermining: No Wound Description Classification: Full Thickness Without Exposed Support Structures Wound Margin:  Distinct, outline attached Exudate Amount: Medium Exudate Type: Serous Exudate Color: amber Foul Odor After Cleansing: No Slough/Fibrino Yes Wound Bed Granulation Amount: Large (67-100%) Exposed Structure Granulation Quality: Pink Fascia Exposed:  No Necrotic Amount: Small (1-33%) Fat Layer (Subcutaneous Tissue) Exposed: Yes Necrotic Quality: Adherent Slough Tendon Exposed: No Muscle Exposed: No Joint Exposed: No Bone Exposed: No Treatment Notes Wound #5 (Left, Medial Lower Leg) 1. Cleanse With Wound Cleanser Soap and water 3. Primary Dressing Applied Calcium Alginate Ag 4. Secondary Dressing Dry Gauze 6. Support Layer Applied 4 layer compression Water quality scientist) Signed: 02/02/2020 5:13:16 PM By: Carlene Coria RN Entered By: Carlene Coria on 01/29/2020 14:01:58 -------------------------------------------------------------------------------- Wound Assessment Details Patient Name: Date of Service: Nicholas [name] DFO RD K. 01/29/2020 1:00 PM Medical Record Number: [id number] Patient Account Number: [id number] Date of Birth/Sex: Treating RN: [birth year] (73 y.o. Nicholas [name]) Carlene Coria Primary Care Roshon Duell: [name] Other Clinician: Referring Alekhya Gravlin: Treating Johnavon Mcclafferty/Extender: [name] in Treatment: 3 Wound Status Wound Number: 6 Primary Lymphedema Etiology: Wound Location: Left, Lateral Lower Leg Wound Open Wounding Event: Gradually Appeared Status: Date Acquired: 12/03/2019 Comorbid Lymphedema, Sleep Apnea, Congestive Heart Failure, Weeks Of Treatment: 3 History: Hypertension, Peripheral Venous Disease, Received Clustered Wound: No Chemotherapy Wound Measurements Length: (cm) 0.6 Width: (cm) 0.6 Depth: (cm) 0.1 Area: (cm) 0.283 Volume: (cm) 0.028 Wound Description Classification: Full Thickness Without Exposed Support Structu Wound Margin: Distinct, outline attached Exudate Amount: Medium Exudate Type: Serous Exudate Color: amber Foul Odor After Cleansing: Slough/Fibrino % Reduction in Area: 26.5% % Reduction in Volume: 26.3% Epithelialization: None Tunneling: No Undermining: No res No Yes Wound Bed Granulation Amount: Large (67-100%) Exposed  Structure Granulation Quality: Pink Fascia Exposed: No Necrotic Amount: Small (1-33%) Fat Layer (Subcutaneous Tissue) Exposed: Yes Necrotic Quality: Adherent Slough Tendon Exposed: No Muscle Exposed: No Joint Exposed: No Bone Exposed: No Treatment Notes Wound #6 (Left, Lateral Lower Leg) 1. Cleanse With Wound Cleanser Soap and water 3. Primary Dressing Applied Calcium Alginate Ag 4. Secondary Dressing Dry Gauze 6. Support Layer Applied 4 layer compression Water quality scientist) Signed: 02/02/2020 5:13:16 PM By: Carlene Coria RN Entered By: Carlene Coria on 01/29/2020 14:02:22 -------------------------------------------------------------------------------- Wound Assessment Details Patient Name: Date of Service: Nicholas [name] DFO RD K. 01/29/2020 1:00 PM Medical Record Number: [id number] Patient Account Number: [id number] Date of Birth/Sex: Treating RN: [birth year] (73 y.o. Nicholas [name]) Carlene Coria Primary Care Qiana Landgrebe: [name] Other Clinician: Referring Delaney Schnick: Treating Orvin Netter/Extender: [name] in Treatment: 3 Wound Status Wound Number: 7 Primary Lymphedema Etiology: Wound Location: Left, Proximal, Lateral Lower Leg Wound Open Wounding Event: Gradually Appeared Status: Date Acquired: 12/03/2019 Comorbid Lymphedema, Sleep Apnea, Congestive Heart Failure, Weeks Of Treatment: 3 History: Hypertension, Peripheral Venous Disease, Received Clustered Wound: No Chemotherapy Wound Measurements Length: (cm) 5 Width: (cm) 4 Depth: (cm) 0.1 Area: (cm) 15.708 Volume: (cm) 1.571 % Reduction in Area: -11040.4% % Reduction in Volume: -11121.4% Epithelialization: None Tunneling: No Undermining: No Wound Description Classification: Full Thickness Without Exposed Support Structures Wound Margin: Distinct, outline attached Exudate Amount: Medium Exudate Type: Serous Exudate Color: amber Wound Bed Granulation Amount: Large  (67-100%) Granulation Quality: Pink Necrotic Amount: Small (1-33%) Necrotic Quality: Adherent Slough Foul Odor After Cleansing: No Slough/Fibrino Yes Exposed Structure Fascia Exposed: No Fat Layer (Subcutaneous Tissue) Exposed: Yes Tendon Exposed: No Muscle Exposed: No Joint Exposed: No Bone Exposed: No Treatment Notes Wound #7 (Left, Proximal, Lateral Lower Leg) 1. Cleanse With Wound Cleanser Soap and water 3. Primary Dressing  Applied Calcium Alginate Ag 4. Secondary Dressing Dry Gauze 6. Support Layer Applied 4 layer compression wrap Electronic Signature(s) Signed: 02/02/2020 5:13:16 PM By: Carlene Coria RN Entered By: Carlene Coria on 01/29/2020 14:02:42 -------------------------------------------------------------------------------- Vitals Details Patient Name: Date of Service: Nicholas [name] DFO RD K. 01/29/2020 1:00 PM Medical Record Number: [id number] Patient Account Number: [id number] Date of Birth/Sex: Treating RN: 02/16/1946 (73 y.o. Nicholas [name]) Carlene Coria Primary Care Haralambos Yeatts: [name] Other Clinician: Referring Maryum Batterson: Treating Maxi Carreras/Extender: [name] in Treatment: 3 Vital Signs Time Taken: 13:45 Temperature (F): 98.4 Height (in): 67 Pulse (bpm): 85 Weight (lbs): 250 Respiratory Rate (breaths/min): 18 Body Mass Index (BMI): 39.2 Blood Pressure (mmHg): 122/80 Reference Range: 80 - 120 mg / dl Electronic Signature(s) Signed: 02/02/2020 5:13:16 PM By: Carlene Coria RN Entered By: Carlene Coria on 01/29/2020 13:59:44

## 2020-02-05 ENCOUNTER — Encounter (HOSPITAL_BASED_OUTPATIENT_CLINIC_OR_DEPARTMENT_OTHER): Payer: PPO | Admitting: Internal Medicine

## 2020-02-05 DIAGNOSIS — L97811 Non-pressure chronic ulcer of other part of right lower leg limited to breakdown of skin: Secondary | ICD-10-CM | POA: Diagnosis not present

## 2020-02-05 DIAGNOSIS — I890 Lymphedema, not elsewhere classified: Secondary | ICD-10-CM | POA: Diagnosis not present

## 2020-02-05 DIAGNOSIS — L97821 Non-pressure chronic ulcer of other part of left lower leg limited to breakdown of skin: Secondary | ICD-10-CM | POA: Diagnosis not present

## 2020-02-09 NOTE — Progress Notes (Signed)
ROLEN CONGER ([id number]) , Visit Report for 02/05/2020 Arrival Information Details Patient Name: Date of Service: [name] 02/05/2020 1:00 PM Medical Record Number: [id number] Patient Account Number: [id number] Date of Birth/Sex: Treating RN: Mar 06, 1946 (74 y.o. [name] Primary Care Royalty Domagala: [name] Other Clinician: Referring Rickardo Brinegar: Treating Orian Figueira/Extender: [name] in Treatment: 4 Visit Information History Since Last Visit Added or deleted any medications: No Patient Arrived: Wheel Chair Any new allergies or adverse reactions: No Arrival Time: 13:18 Had a fall or experienced change in No Accompanied By: wife activities of daily living that may affect Transfer Assistance: EasyPivot Patient Lift risk of falls: Patient Identification Verified: Yes Signs or symptoms of abuse/neglect since last visito No Secondary Verification Process Completed: Yes Hospitalized since last visit: No Patient Requires Transmission-Based Precautions: No Implantable device outside of the clinic excluding No Patient Has Alerts: Yes cellular tissue based products placed in the center Patient Alerts: ABI: [place] 07/2018 since last visit: Has Dressing in Place as Prescribed: No Has Compression in Place as Prescribed: No Pain Present Now: No Notes patient stated he had to remove dressings due to getting soiled Electronic Signature(s) Signed: 02/05/2020 5:52:12 PM By: Kela Millin Entered By: Kela Millin on 02/05/2020 13:19:53 -------------------------------------------------------------------------------- Compression Therapy Details Patient Name: Date of Service: [name] 02/05/2020 1:00 PM Medical Record Number: [id number] Patient Account Number: [id number] Date of Birth/Sex: Treating RN: 09-30-1945 (74 y.o. [name] Primary Care Nonie Lochner: [name] Other Clinician: Referring Laurent Cargile: Treating  Madi Bonfiglio/Extender: [name] in Treatment: 4 Compression Therapy Performed for Wound Assessment: Wound #5 Left,Medial Lower Leg Performed By: Clinician Levan Hurst, RN Compression Type: Four Layer Post Procedure Diagnosis Same as Pre-procedure Electronic Signature(s) Signed: 02/09/2020 4:12:59 PM By: Levan Hurst RN, BSN Entered By: Levan Hurst on 02/05/2020 13:56:42 -------------------------------------------------------------------------------- Compression Therapy Details Patient Name: Date of Service: [name] 02/05/2020 1:00 PM Medical Record Number: [id number] Patient Account Number: [id number] Date of Birth/Sex: Treating RN: 1945-08-02 (74 y.o. [name] Primary Care Jozalyn Baglio: [name] Other Clinician: Referring Derrien Anschutz: Treating Ridley Schewe/Extender: [name] in Treatment: 4 Compression Therapy Performed for Wound Assessment: Wound #7 Left,Proximal,Lateral Lower Leg Performed By: Clinician Levan Hurst, RN Compression Type: Four Layer Post Procedure Diagnosis Same as Pre-procedure Electronic Signature(s) Signed: 02/09/2020 4:12:59 PM By: Levan Hurst RN, BSN Entered By: Levan Hurst on 02/05/2020 13:56:42 -------------------------------------------------------------------------------- Compression Therapy Details Patient Name: Date of Service: [name] 02/05/2020 1:00 PM Medical Record Number: [id number] Patient Account Number: [id number] Date of Birth/Sex: Treating RN: 1945/08/11 (74 y.o. [name] Primary Care Gaurav Baldree: [name] Other Clinician: Referring Deriana Vanderhoef: Treating Zadaya Cuadra/Extender: [name] in Treatment: 4 Compression Therapy Performed for Wound Assessment: NonWound Condition Lymphedema - Bilateral Leg Performed By: Clinician Levan Hurst, RN Compression Type: Four Layer Post Procedure Diagnosis Same  as Pre-procedure Electronic Signature(s) Signed: 02/09/2020 4:12:59 PM By: Levan Hurst RN, BSN Entered By: Levan Hurst on 02/05/2020 13:56:55 -------------------------------------------------------------------------------- Encounter Discharge Information Details Patient Name: Date of Service: [name] 02/05/2020 1:00 PM Medical Record Number: [id number] Patient Account Number: [id number] Date of Birth/Sex: Treating RN: 07-04-1946 (74 y.o. [name] Primary Care Cordelro Gautreau: [name] Other Clinician: Referring Sameka Bagent: Treating Andalyn Heckstall/Extender: [name] in Treatment: 4 Encounter Discharge Information Items Discharge Condition: Stable Ambulatory Status: Wheelchair Discharge Destination: Home Transportation:  Private Auto Accompanied By: wife Schedule Follow-up Appointment: Yes Clinical Summary of Care: Patient Declined Electronic Signature(s) Signed: 02/05/2020 5:52:12 PM By: Kela Millin Entered By: Kela Millin on 02/05/2020 17:33:34 -------------------------------------------------------------------------------- Lower Extremity Assessment Details Patient Name: Date of Service: [name] 02/05/2020 1:00 PM Medical Record Number: [id number] Patient Account Number: [id number] Date of Birth/Sex: Treating RN: [birth year] (74 y.o. [name] Primary Care Analyse Angst: [name] Other Clinician: Referring Radha Coggins: Treating Tressia Labrum/Extender: [name] in Treatment: 4 Edema Assessment Assessed: [name]: No] [name]: No] Edema: [Left: Yes] [Right: Yes] Calf Left: Right: Point of Measurement: 40 cm From Medial Instep 59 cm 57 cm Ankle Left: Right: Point of Measurement: 19 cm From Medial Instep 41 cm 41 cm Vascular Assessment Pulses: Dorsalis Pedis Palpable: [Left:No] [Right:No] Electronic Signature(s) Signed: 02/05/2020 5:52:12 PM By: Kela Millin Entered By: Kela Millin on 02/05/2020 13:21:35 -------------------------------------------------------------------------------- Multi Wound Chart Details Patient Name: Date of Service: [name] 02/05/2020 1:00 PM Medical Record Number: [id number] Patient Account Number: [id number] Date of Birth/Sex: Treating RN: April 26, 1946 (74 y.o. [name] Primary Care Chasten Blaze: [name] Other Clinician: Referring Taylon Louison: Treating Tiffany Calmes/Extender: [name] in Treatment: 4 Vital Signs Height(in): 22 Pulse(bpm): 63 Weight(lbs): 250 Blood Pressure(mmHg): 133/85 Body Mass Index(BMI): 39 Temperature(F): 98.1 Respiratory Rate(breaths/min): 19 Photos: [4:No Photos Right, Lateral Lower Leg] [5:No Photos Left, Medial Lower Leg] [6:Left, Lateral Lower Leg] Wound Location: [4:Gradually Appeared] [5:Gradually Appeared] [6:Gradually Appeared] Wounding Event: [4:Lymphedema] [5:Lymphedema] [6:Lymphedema] Primary Etiology: [4:Lymphedema, Sleep Apnea,] [5:Lymphedema, Sleep Apnea,] [6:Lymphedema, Sleep Apnea,] Comorbid History: [4:Congestive Heart Failure, Hypertension, Peripheral Venous Disease, Received Chemotherapy 12/03/2019] [5:Congestive Heart Failure, Hypertension, Peripheral Venous Disease, Received Chemotherapy 12/03/2019] [6:Congestive Heart Failure,  Hypertension, Peripheral Venous Disease, Received Chemotherapy 12/03/2019] Date Acquired: [4:4] [5:4] [6:4] Weeks of Treatment: [4:Healed - Epithelialized] [5:Open] [6:Healed - Epithelialized] Wound Status: [4:0x0x0] [5:0.2x0.2x0.1] [6:0x0x0] Measurements L x W x D (cm) [4:0] [5:0.031] [6:0] A (cm) : rea [4:0] [5:0.003] [6:0] Volume (cm) : [4:100.00%] [5:80.30%] [6:100.00%] % Reduction in Area: [4:100.00%] [5:81.30%] [6:100.00%] % Reduction in Volume: [4:Full Thickness Without Exposed] [5:Full Thickness Without Exposed] [6:Full Thickness Without Exposed] Classification: [4:Support  Structures None Present] [5:Support Structures Medium] [6:Support Structures None Present] Exudate Amount: [4:N/A] [5:Serous] [6:N/A] Exudate Type: [4:N/A] [5:amber] [6:N/A] Exudate Color: [4:Distinct, outline attached] [5:Distinct, outline attached] [6:Distinct, outline attached] Wound Margin: [4:None Present (0%)] [5:Large (67-100%)] [6:None Present (0%)] Granulation Amount: [4:N/A] [5:Pink] [6:N/A] Granulation Quality: [4:None Present (0%)] [5:Small (1-33%)] [6:None Present (0%)] Necrotic Amount: [4:Fascia: No] [5:Fat Layer (Subcutaneous Tissue)] [6:Fascia: No] Exposed Structures: [4:Fat Layer (Subcutaneous Tissue) Exposed: No Tendon: No Muscle: No Joint: No Bone: No Large (67-100%)] [5:Exposed: Yes Fascia: No Tendon: No Muscle: No Joint: No Bone: No None] [6:Fat Layer (Subcutaneous Tissue) Exposed: No Tendon: No Muscle: No Joint:  No Bone: No Large (67-100%)] Epithelialization: [4:N/A] [5:Compression Therapy] [6:N/A] Wound Number: 7 N/A N/A Photos: No Photos N/A N/A Left, Proximal, Lateral Lower Leg N/A N/A Wound Location: Gradually Appeared N/A N/A Wounding Event: Lymphedema N/A N/A Primary Etiology: Lymphedema, Sleep Apnea, N/A N/A Comorbid History: Congestive Heart Failure, Hypertension, Peripheral Venous Disease, Received Chemotherapy 12/03/2019 N/A N/A Date Acquired: 4 N/A N/A Weeks of Treatment: Open N/A N/A Wound Status: 3.9x4x0.1 N/A N/A Measurements L x W x D (cm) 12.252 N/A N/A A (cm) : rea 1.225 N/A N/A Volume (cm) : -8589.40% N/A N/A % Reduction in Area: -8650.00% N/A N/A % Reduction in Volume: Full Thickness Without Exposed N/A  N/A Classification: Support Structures Medium N/A N/A Exudate Amount: Serous N/A N/A Exudate Type: amber N/A N/A Exudate Color: Distinct, outline attached N/A N/A Wound Margin: Large (67-100%) N/A N/A Granulation Amount: Red, Pink N/A N/A Granulation Quality: Small (1-33%) N/A N/A Necrotic Amount: Fat Layer  (Subcutaneous Tissue) N/A N/A Exposed Structures: Exposed: Yes Fascia: No Tendon: No Muscle: No Joint: No Bone: No None N/A N/A Epithelialization: Compression Therapy N/A N/A Procedures Performed: Treatment Notes Electronic Signature(s) Signed: 02/05/2020 5:51:19 PM By: Linton Ham MD Signed: 02/09/2020 4:12:59 PM By: Levan Hurst RN, BSN Entered By: Linton Ham on 02/05/2020 13:58:26 -------------------------------------------------------------------------------- Multi-Disciplinary Care Plan Details Patient Name: Date of Service: [name] 02/05/2020 1:00 PM Medical Record Number: [id number] Patient Account Number: [id number] Date of Birth/Sex: Treating RN: May 19, 1946 (74 y.o. [name] Primary Care Alichia Alridge: [name] Other Clinician: Referring Arlynn Mcdermid: Treating Isam Unrein/Extender: [name] in Treatment: 4 Active Inactive Venous Leg Ulcer Nursing Diagnoses: Potential for venous Insuffiency (use before diagnosis confirmed) Goals: Patient will maintain optimal edema control Date Initiated: 01/08/2020 Target Resolution Date: 02/27/2020 Goal Status: Active Patient/caregiver will verbalize understanding of disease process and disease management Date Initiated: 01/08/2020 Date Inactivated: 02/05/2020 Target Resolution Date: 01/30/2020 Goal Status: Met Interventions: Assess peripheral edema status every visit. Notes: Wound/Skin Impairment Nursing Diagnoses: Knowledge deficit related to ulceration/compromised skin integrity Goals: Patient/caregiver will verbalize understanding of skin care regimen Date Initiated: 01/08/2020 Target Resolution Date: 02/27/2020 Goal Status: Active Ulcer/skin breakdown will have a volume reduction of 30% by week 4 Date Initiated: 01/08/2020 Date Inactivated: 02/05/2020 Target Resolution Date: 01/30/2020 Goal Status: Met Interventions: Assess patient/caregiver ability to obtain  necessary supplies Assess patient/caregiver ability to perform ulcer/skin care regimen upon admission and as needed Provide education on ulcer and skin care Treatment Activities: Skin care regimen initiated : 01/08/2020 Topical wound management initiated : 01/08/2020 Notes: Electronic Signature(s) Signed: 02/09/2020 4:12:59 PM By: Levan Hurst RN, BSN Entered By: Levan Hurst on 02/05/2020 17:33:35 -------------------------------------------------------------------------------- Pain Assessment Details Patient Name: Date of Service: [name] 02/05/2020 1:00 PM Medical Record Number: [id number] Patient Account Number: [id number] Date of Birth/Sex: Treating RN: April 28, 1946 (74 y.o. [name] Primary Care Laquanta Hummel: [name] Other Clinician: Referring Shakiyah Cirilo: Treating Forbes Loll/Extender: [name] in Treatment: 4 Active Problems Location of Pain Severity and Description of Pain Patient Has Paino No Site Locations Pain Management and Medication Current Pain Management: Electronic Signature(s) Signed: 02/05/2020 5:52:12 PM By: Kela Millin Entered By: Kela Millin on 02/05/2020 13:20:17 -------------------------------------------------------------------------------- Patient/Caregiver Education Details Patient Name: Date of Service: [name] 7/15/2021andnbsp1:00 PM Medical Record Number: [id number] Patient Account Number: [id number] Date of Birth/Gender: Treating RN: December 28, 1945 (74 y.o. [name] Primary Care Physician: [name] Other Clinician: Referring Physician: Treating Physician/Extender: [name] in Treatment: 4 Education Assessment Education Provided To: Patient Education Topics Provided Wound/Skin Impairment: Methods: Explain/Verbal Responses: State content correctly Electronic Signature(s) Signed: 02/09/2020 4:12:59 PM By: Levan Hurst  RN, BSN Entered By: Levan Hurst on 02/05/2020 17:33:57 -------------------------------------------------------------------------------- Wound Assessment Details Patient Name: Date of Service: [name] 02/05/2020 1:00 PM Medical Record Number: [id number] Patient Account Number: [id number] Date of Birth/Sex: Treating RN: [birth year] (74 y.o. [name] Primary Care Kalle Bernath: [name] Other Clinician: Referring Madlynn Lundeen: Treating Maryanne Huneycutt/Extender: [name] in Treatment: 4 Wound Status Wound Number: 4 Primary Lymphedema Etiology: Wound Location: Right, Lateral Lower Leg  Wound Healed - Epithelialized Wounding Event: Gradually Appeared Status: Date Acquired: 12/03/2019 Comorbid Lymphedema, Sleep Apnea, Congestive Heart Failure, Weeks Of Treatment: 4 History: Hypertension, Peripheral Venous Disease, Received Chemotherapy Clustered Wound: No Photos Photo Uploaded By: Mikeal Hawthorne on 02/06/2020 14:06:32 Wound Measurements Length: (cm) Width: (cm) Depth: (cm) Area: (cm) Volume: (cm) 0 % Reduction in Area: 100% 0 % Reduction in Volume: 100% 0 Epithelialization: Large (67-100%) 0 Tunneling: No 0 Undermining: No Wound Description Classification: Full Thickness Without Exposed Support Structures Wound Margin: Distinct, outline attached Exudate Amount: None Present Foul Odor After Cleansing: No Slough/Fibrino No Wound Bed Granulation Amount: None Present (0%) Exposed Structure Necrotic Amount: None Present (0%) Fascia Exposed: No Fat Layer (Subcutaneous Tissue) Exposed: No Tendon Exposed: No Muscle Exposed: No Joint Exposed: No Bone Exposed: No Electronic Signature(s) Signed: 02/05/2020 5:52:12 PM By: Kela Millin Entered By: Kela Millin on 02/05/2020 13:22:19 -------------------------------------------------------------------------------- Wound Assessment Details Patient Name: Date of Service: [name] 02/05/2020 1:00 PM Medical Record Number: [id number] Patient Account Number: [id number] Date of Birth/Sex: Treating RN: 1945/09/30 (74 y.o. [name] Primary Care Glennie Rodda: [name] Other Clinician: Referring Anesia Blackwell: Treating Zakiah Gauthreaux/Extender: [name] in Treatment: 4 Wound Status Wound Number: 5 Primary Lymphedema Etiology: Wound Location: Left, Medial Lower Leg Wound Open Wounding Event: Gradually Appeared Status: Date Acquired: 12/03/2019 Comorbid Lymphedema, Sleep Apnea, Congestive Heart Failure, Weeks Of Treatment: 4 History: Hypertension, Peripheral Venous Disease, Received Chemotherapy Clustered Wound: No Photos Photo Uploaded By: Mikeal Hawthorne on 02/06/2020 14:06:33 Wound Measurements Length: (cm) 0.2 Width: (cm) 0.2 Depth: (cm) 0.1 Area: (cm) 0.031 Volume: (cm) 0.003 % Reduction in Area: 80.3% % Reduction in Volume: 81.3% Epithelialization: None Tunneling: No Undermining: No Wound Description Classification: Full Thickness Without Exposed Support Structu Wound Margin: Distinct, outline attached Exudate Amount: Medium Exudate Type: Serous Exudate Color: amber res Foul Odor After Cleansing: No Slough/Fibrino Yes Wound Bed Granulation Amount: Large (67-100%) Exposed Structure Granulation Quality: Pink Fascia Exposed: No Necrotic Amount: Small (1-33%) Fat Layer (Subcutaneous Tissue) Exposed: Yes Necrotic Quality: Adherent Slough Tendon Exposed: No Muscle Exposed: No Joint Exposed: No Bone Exposed: No Treatment Notes Wound #5 (Left, Medial Lower Leg) 1. Cleanse With Wound Cleanser Soap and water 2. Periwound Care TCA Cream 3. Primary Dressing Applied Calcium Alginate Ag 4. Secondary Dressing ABD Pad Kerramax/Xtrasorb 6. Support Layer Applied 4 layer compression wrap Electronic Signature(s) Signed: 02/05/2020 5:52:12 PM By: Kela Millin Entered By: Kela Millin on  02/05/2020 13:30:48 -------------------------------------------------------------------------------- Wound Assessment Details Patient Name: Date of Service: [name] 02/05/2020 1:00 PM Medical Record Number: [id number] Patient Account Number: [id number] Date of Birth/Sex: Treating RN: 04-Aug-1945 (74 y.o. [name] Primary Care Dajuan Turnley: [name] Other Clinician: Referring Zariana Strub: Treating Leliana Kontz/Extender: [name] in Treatment: 4 Wound Status Wound Number: 6 Primary Lymphedema Etiology: Wound Location: Left, Lateral Lower Leg Wound Healed - Epithelialized Wounding Event: Gradually Appeared Status: Date Acquired: 12/03/2019 Comorbid Lymphedema, Sleep Apnea, Congestive Heart Failure, Weeks Of Treatment: 4 History: Hypertension, Peripheral Venous Disease, Received Chemotherapy Clustered Wound: No Photos Photo Uploaded By: Mikeal Hawthorne on 02/06/2020 14:07:12 Wound Measurements Length: (cm) Width: (cm) Depth: (cm) Area: (cm) Volume: (cm) 0 % Reduction in Area: 100% 0 % Reduction in Volume: 100% 0 Epithelialization: Large (67-100%) 0 Tunneling: No 0 Undermining: No Wound Description Classification: Full Thickness Without Exposed Support Structures Wound Margin: Distinct, outline attached Exudate Amount: None Present Foul Odor After Cleansing: No Slough/Fibrino No Wound  Bed Granulation Amount: None Present (0%) Exposed Structure Necrotic Amount: None Present (0%) Fascia Exposed: No Fat Layer (Subcutaneous Tissue) Exposed: No Tendon Exposed: No Muscle Exposed: No Joint Exposed: No Bone Exposed: No Electronic Signature(s) Signed: 02/05/2020 5:52:12 PM By: Kela Millin Entered By: Kela Millin on 02/05/2020 13:25:17 -------------------------------------------------------------------------------- Wound Assessment Details Patient Name: Date of Service: [name] 02/05/2020 1:00  PM Medical Record Number: [id number] Patient Account Number: [id number] Date of Birth/Sex: Treating RN: 1945/12/10 (74 y.o. [name] Primary Care Jerrion Tabbert: [name] Other Clinician: Referring Kammi Hechler: Treating Tishara Pizano/Extender: [name] in Treatment: 4 Wound Status Wound Number: 7 Primary Lymphedema Etiology: Wound Location: Left, Proximal, Lateral Lower Leg Wound Open Wounding Event: Gradually Appeared Status: Date Acquired: 12/03/2019 Comorbid Lymphedema, Sleep Apnea, Congestive Heart Failure, Weeks Of Treatment: 4 History: Hypertension, Peripheral Venous Disease, Received Chemotherapy Clustered Wound: No Photos Photo Uploaded By: Mikeal Hawthorne on 02/06/2020 14:07:30 Wound Measurements Length: (cm) 3.9 Width: (cm) 4 Depth: (cm) 0.1 Area: (cm) 12.252 Volume: (cm) 1.225 % Reduction in Area: -8589.4% % Reduction in Volume: -8650% Epithelialization: None Tunneling: No Undermining: No Wound Description Classification: Full Thickness Without Exposed Support Structu Wound Margin: Distinct, outline attached Exudate Amount: Medium Exudate Type: Serous Exudate Color: amber res Foul Odor After Cleansing: No Slough/Fibrino Yes Wound Bed Granulation Amount: Large (67-100%) Exposed Structure Granulation Quality: Red, Pink Fascia Exposed: No Necrotic Amount: Small (1-33%) Fat Layer (Subcutaneous Tissue) Exposed: Yes Necrotic Quality: Adherent Slough Tendon Exposed: No Muscle Exposed: No Joint Exposed: No Bone Exposed: No Treatment Notes Wound #7 (Left, Proximal, Lateral Lower Leg) 1. Cleanse With Wound Cleanser Soap and water 2. Periwound Care TCA Cream 3. Primary Dressing Applied Calcium Alginate Ag 4. Secondary Dressing ABD Pad Kerramax/Xtrasorb 6. Support Layer Applied 4 layer compression wrap Electronic Signature(s) Signed: 02/05/2020 5:52:12 PM By: Kela Millin Entered By: Kela Millin on  02/05/2020 13:24:52 -------------------------------------------------------------------------------- Vitals Details Patient Name: Date of Service: [name] 02/05/2020 1:00 PM Medical Record Number: [id number] Patient Account Number: [id number] Date of Birth/Sex: Treating RN: 1946/06/26 (74 y.o. [name] Primary Care Kamaal Cast: [name] Other Clinician: Referring Arias Weinert: Treating Lovey Crupi/Extender: [name] in Treatment: 4 Vital Signs Time Taken: 13:15 Temperature (F): 98.1 Height (in): 67 Pulse (bpm): 94 Weight (lbs): 250 Respiratory Rate (breaths/min): 19 Body Mass Index (BMI): 39.2 Blood Pressure (mmHg): 133/85 Reference Range: 80 - 120 mg / dl Electronic Signature(s) Signed: 02/05/2020 5:52:12 PM By: Kela Millin Entered By: Kela Millin on 02/05/2020 13:20:11

## 2020-02-09 NOTE — Progress Notes (Signed)
Nicholas COLEGROVE ([id number]) , Visit Report for 02/05/2020 HPI Details Patient Name: Date of Service: Nicholas [name] DFO RD K. 02/05/2020 1:00 PM Medical Record Number: [id number] Patient Account Number: [id number] Date of Birth/Sex: Treating RN: July 24, 1946 (74 y.o. Nicholas [name] Primary Care Provider: Garret [name] Other Clinician: Referring Provider: Treating Provider/Extender: Nicholas [name] in Treatment: 4 History of Present Illness HPI Description: ADMISSION 07/04/2018 This is a 74 year old man who has bilateral lymphedema felt to be secondary to previous treatment for malignant melanoma on his left toes. 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 Malignant melanoma was treated with immunotherapy. He is not had radiation. Sometime after this he developed edema in the left leg that spread into the right leg. He has bilateral compression pumps at home and he uses them twice a day and claims to be quite compliant. He also has a juxta lite type stocking at home which is apparently 62-year-old. He states that he fell in early October dislocating and fracturing his left shoulder. He slept in a recliner with his legs dependent. He was seen and followed at the rehab clinic at [facility] and was having lymphedema wraps done by 1 of the PT who noted his open wounds last week and he has been referred here for management. The patient has 2 open wounds on the right s distal lower leg and one on the left anterior lower leg The patient is not a diabetic. 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 For one reason or another we could not get him set up with home health and he kept the wraps on all week which he generally seem to tolerate. He did not use his compression pumps over the top of the wraps which I told him he could do this week. He comes in with a new rash on both feet which looks like tinea 07/22/2018; much better edema control bilaterally. The wounds on the right lateral calf and left anterior lower leg both look a lot better. There is epithelialization over both surfaces but I think this is fragile and I am going to put him in our compression wraps for another week. He says he is been using his compression pumps at home. He has a constellation of lower extremity compression garments that he is going to bring in next week. In discussion with him I cannot really get a sense of everything he has. I think he was at one point prescribed 30-40 below-knee stockings but he points out he could not get them on. 07/29/2018; the patient arrives in his legs are healed. He has a constellation of stockings none of which I think are going to be that helpful except for the fact that he has external wraparound stockings of some description. He has above-knee stockings but I cannot imagine it would be possible to get these on READMISSION 01/08/2020 Patient is now a 74 year old man. We had him for a short period from December 2019 through January 2020. He has bilateral lower extremity lymphedema which he initially traces to treatment for malignant melanoma 7 years ago. He has been followed at the lymphedema clinic on  [address] and recently has compression pumps that go up to his mid abdomen. He recently developed superficial wounds bilaterally. They will not manage these in the lymphedema clinic where the services are run by therapy. He has not been using his compression pumps. His significant other cannot get the stockings on his legs and the patient is only limited ability to help because  of Parkinson's disease Past medical history is essentially unchanged. He has sleep apnea, Parkinson's disease at some point labeled his Parkinson's plus, bilateral lymphedema, history of malignant melanoma. He does not have an arterial issue that I am aware of although his previous arterial studies have been noncompressible 7/1; patient we admitted the clinic 2 weeks ago. He has severe bilateral lymphedema. He had wounds predominantly on the left leg but also superficially on the right. He has compression pumps which he says he is using once a day. 7/15; 2-week follow-up. He comes in for nurse visit. He has severe bilateral lymphedema. He has upper abdominal level compression pumps which she is using twice a day. We have been wrapping both his lower legs the areas on the right leg are healed. He still has an area of left upper lateral and right anterior medial. Anterior medial wound is small. He is tolerating this well. Electronic Signature(s) Signed: 02/05/2020 5:51:19 PM By: Nicholas Ham MD Entered By: Nicholas [name] on 02/05/2020 13:59:27 -------------------------------------------------------------------------------- Physical Exam Details Patient Name: Date of Service: Nicholas [name] DFO RD K. 02/05/2020 1:00 PM Medical Record Number: [id number] Patient Account Number: [id number] Date of Birth/Sex: Treating RN: Jun 30, 1946 (74 y.o. Nicholas [name] Primary Care Provider: Garret [name] Other Clinician: Referring Provider: Treating Provider/Extender: Nicholas [name] in Treatment: 4 Constitutional Sitting or standing Blood Pressure is within target range for patient.. Pulse regular and within target range for patient.Marland Kitchen Respirations regular, non-labored and within target range.. Temperature is normal and within the target range for the patient.Marland Kitchen Appears in no distress. Cardiovascular Difficult to feel his pedal pulses however his feet are warm. Notes Wound  exam Severe lymphedema bilaterally. This is somewhat better than when I first saw him on this go around. Softer although he still has a lot of fluid with a pantaloon deformity of both ankles. On the right everything is closed. Edema is slightly improved. On the left upper lateral fairly large but superficial wound. Much smaller wound on the anterior medial lower leg on the left She has venous inflammation type changes/stasis dermatitis in both legs but no evidence of infection Electronic Signature(s) Signed: 02/05/2020 5:51:19 PM By: Nicholas Ham MD Entered By: Nicholas [name] on 02/05/2020 14:00:54 -------------------------------------------------------------------------------- Physician Orders Details Patient Name: Date of Service: Nicholas [name] DFO RD K. 02/05/2020 1:00 PM Medical Record Number: [id number] Patient Account Number: [id number] Date of Birth/Sex: Treating RN: [birth year] (74 y.o. 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 Dressing Change Frequency Do not change entire dressing for one week. Skin Barriers/Peri-Wound Care Moisturizing lotion TCA Cream or Ointment - mixed with lotion to both legs liberally. Wound Cleansing May shower with protection. - with cast protectors.  Primary Wound Dressing Wound #5 Left,Medial Lower Leg Calcium Alginate with Silver Wound #7 Left,Proximal,Lateral Lower Leg Calcium Alginate with Silver Secondary Dressing Wound #5 Left,Medial Lower Leg Dry  Gauze ABD pad Other: - pad areas to legs in skin folds for protection Wound #7 Left,Proximal,Lateral Lower Leg ABD pad Zetuvit or Kerramax Other: - pad areas to legs in skin folds for protection Edema Control 4 layer compression - Bilateral Avoid standing for long periods of time Elevate legs to the level of the heart or above for 30 minutes daily and/or when sitting, a frequency of: - throughout the day. Exercise regularly Segmental Compressive Device. - use lymphedema pumps twice a day an hour each time. Once in the morning and once in the evening. Electronic Signature(s) Signed: 02/05/2020 5:51:19 PM By: Nicholas Ham MD Signed: 02/09/2020 4:12:59 PM By: Levan Hurst RN, BSN Entered By: Levan Hurst on 02/05/2020 13:55:37 -------------------------------------------------------------------------------- Problem List Details Patient Name: Date of Service: Nicholas [name] DFO RD K. 02/05/2020 1:00 PM Medical Record Number: [id number] Patient Account Number: [id number] Date of Birth/Sex: Treating RN: 1945/08/17 (74 y.o. Nicholas [name] Primary Care Provider: Garret [name] Other Clinician: Referring Provider: Treating Provider/Extender: Nicholas [name] in Treatment: 4 Active Problems ICD-10 Encounter Code Description Active Date MDM Diagnosis I89.0 Lymphedema, not elsewhere classified 01/08/2020 No Yes L97.811 Non-pressure chronic ulcer of other part of right lower leg limited to breakdown 01/08/2020 No Yes of skin L97.821 Non-pressure chronic ulcer of other part of left lower leg limited to breakdown 01/08/2020 No Yes of skin Inactive Problems Resolved Problems Electronic Signature(s) Signed: 02/05/2020 5:51:19 PM By: Nicholas Ham MD Entered By: Nicholas [name] on 02/05/2020 13:57:55 -------------------------------------------------------------------------------- Progress Note Details Patient Name: Date of Service: Nicholas [name] DFO RD K. 02/05/2020  1:00 PM Medical Record Number: [id number] Patient Account Number: [id number] Date of Birth/Sex: Treating RN: 1945/12/12 (74 y.o. Nicholas [name] Primary Care Provider: Garret [name] Other Clinician: Referring Provider: Treating Provider/Extender: Nicholas [name] in Treatment: 4 Subjective History of Present Illness (HPI) ADMISSION 07/04/2018 This is a 73 year old man who has bilateral lymphedema felt to be secondary to previous treatment for malignant melanoma on his left toes. 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 Malignant melanoma was treated with immunotherapy. He is not had radiation. Sometime after this he developed edema in the left leg that spread into the right leg. He has bilateral compression pumps at home and he uses them twice a day and claims to be quite compliant. He also has a juxta lite type stocking at home which is apparently 47-year-old. He states that he fell in early October dislocating and fracturing his left shoulder. He slept in a recliner with his legs dependent. He was seen and followed at the rehab clinic at [facility] and was having lymphedema wraps done by 1 of the PT who noted his open wounds last week and he has been referred here for management. The patient has 2 open wounds on the right s distal lower leg and one on the left anterior lower leg The patient is not a diabetic. 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  For one reason or another we could not get him set up with home health and he kept the wraps on all week which he generally seem  to tolerate. He did not use his compression pumps over the top of the wraps which I told him he could do this week. He comes in with a new rash on both feet which looks like tinea 07/22/2018; much better edema control bilaterally. The wounds on the right lateral calf and left anterior lower leg both look a lot better. There is epithelialization over both surfaces but I think this is fragile and I am going to put him in our compression wraps for another week. He says he is been using his compression pumps at home. ooHe has a constellation of lower extremity compression garments that he is going to bring in next week. In discussion with him I cannot really get a sense of everything he has. I think he was at one point prescribed 30-40 below-knee stockings but he points out he could not get them on. 07/29/2018; the patient arrives in his legs are healed. He has a constellation of stockings none of which I think are going to be that helpful except for the fact that he has external wraparound stockings of some description. He has above-knee stockings but I cannot imagine it would be possible to get these on READMISSION 01/08/2020 Patient is now a 74 year old man. We had him for a short period from December 2019 through January 2020. He has bilateral lower extremity lymphedema which he initially traces to treatment for malignant melanoma 7 years ago. He has been followed at the lymphedema clinic on [facility] and recently has compression pumps that go up to his mid abdomen. He recently developed superficial wounds bilaterally. They will not manage these in the lymphedema clinic where the services are run by therapy. He has not been using his compression pumps. His significant other cannot get the stockings on his legs and the patient is only limited ability to help because of Parkinson's disease Past medical history is essentially unchanged. He has sleep apnea, Parkinson's disease at some point labeled his  Parkinson's plus, bilateral lymphedema, history of malignant melanoma. He does not have an arterial issue that I am aware of although his previous arterial studies have been noncompressible 7/1; patient we admitted the clinic 2 weeks ago. He has severe bilateral lymphedema. He had wounds predominantly on the left leg but also superficially on the right. He has compression pumps which he says he is using once a day. 7/15; 2-week follow-up. He comes in for nurse visit. He has severe bilateral lymphedema. He has upper abdominal level compression pumps which she is using twice a day. We have been wrapping both his lower legs the areas on the right leg are healed. He still has an area of left upper lateral and right anterior medial. Anterior medial wound is small. He is tolerating this well. Objective Constitutional Sitting or standing Blood Pressure is within target range for patient.. Pulse regular and within target range for patient.Marland Kitchen Respirations regular, non-labored and within target range.. Temperature is normal and within the target range for the patient.Marland Kitchen Appears in no distress. Vitals Time Taken: 1:15 PM, Height: 67 in, Weight: 250 lbs, BMI: 39.2, Temperature: 98.1 F, Pulse: 94 bpm, Respiratory Rate: 19 breaths/min, Blood Pressure: 133/85 mmHg. Cardiovascular Difficult to feel his pedal pulses however his feet are warm. General Notes: Wound exam ooSevere lymphedema bilaterally. This is somewhat better than when I first  saw him on this go around. Softer although he still has a lot of fluid with a pantaloon deformity of both ankles. On the right everything is closed. Edema is slightly improved. ooOn the left upper lateral fairly large but superficial wound. Much smaller wound on the anterior medial lower leg on the left ooShe has venous inflammation type changes/stasis dermatitis in both legs but no evidence of infection Integumentary (Hair, Skin) Wound #4 status is Healed -  Epithelialized. Original cause of wound was Gradually Appeared. The wound is located on the Right,Lateral Lower Leg. The wound measures 0cm length x 0cm width x 0cm depth; 0cm^2 area and 0cm^3 volume. There is no tunneling or undermining noted. There is a none present amount of drainage noted. The wound margin is distinct with the outline attached to the wound base. There is no granulation within the wound bed. There is no necrotic tissue within the wound bed. Wound #5 status is Open. Original cause of wound was Gradually Appeared. The wound is located on the Left,Medial Lower Leg. The wound measures 0.2cm length x 0.2cm width x 0.1cm depth; 0.031cm^2 area and 0.003cm^3 volume. There is Fat Layer (Subcutaneous Tissue) Exposed exposed. There is no tunneling or undermining noted. There is a medium amount of serous drainage noted. The wound margin is distinct with the outline attached to the wound base. There is large (67-100%) pink granulation within the wound bed. There is a small (1-33%) amount of necrotic tissue within the wound bed including Adherent Slough. Wound #6 status is Healed - Epithelialized. Original cause of wound was Gradually Appeared. The wound is located on the Left,Lateral Lower Leg. The wound measures 0cm length x 0cm width x 0cm depth; 0cm^2 area and 0cm^3 volume. There is no tunneling or undermining noted. There is a none present amount of drainage noted. The wound margin is distinct with the outline attached to the wound base. There is no granulation within the wound bed. There is no necrotic tissue within the wound bed. Wound #7 status is Open. Original cause of wound was Gradually Appeared. The wound is located on the Left,Proximal,Lateral Lower Leg. The wound measures 3.9cm length x 4cm width x 0.1cm depth; 12.252cm^2 area and 1.225cm^3 volume. There is Fat Layer (Subcutaneous Tissue) Exposed exposed. There is no tunneling or undermining noted. There is a medium amount of  serous drainage noted. The wound margin is distinct with the outline attached to the wound base. There is large (67-100%) red, pink granulation within the wound bed. There is a small (1-33%) amount of necrotic tissue within the wound bed including Adherent Slough. 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 There was a Four Layer Compression Therapy Procedure by Levan Hurst, RN. Post procedure Diagnosis Wound #5: Same as Pre-Procedure Wound #7 Pre-procedure diagnosis of Wound #7 is a Lymphedema located on the Left,Proximal,Lateral Lower Leg . There was a Four Layer Compression Therapy Procedure by Levan Hurst, RN. Post procedure Diagnosis Wound #7: Same as Pre-Procedure There was a Four Layer Compression Therapy Procedure by Levan Hurst, RN. Post procedure Diagnosis Wound #: Same as Pre-Procedure Plan Follow-up Appointments: Return Appointment in 1 week. Dressing Change Frequency: Do not change entire dressing for one week. Skin Barriers/Peri-Wound Care:  Moisturizing lotion TCA Cream or Ointment - mixed with lotion to both legs liberally. Wound Cleansing: May shower with protection. - with cast protectors. Primary Wound Dressing: Wound #5 Left,Medial Lower Leg: Calcium Alginate with Silver Wound #7 Left,Proximal,Lateral Lower Leg: Calcium Alginate with Silver Secondary Dressing: Wound #5 Left,Medial Lower Leg: Dry Gauze ABD pad Other: - pad areas to legs in skin folds for protection Wound #7 Left,Proximal,Lateral Lower Leg: ABD pad Zetuvit or Kerramax Other: - pad areas to legs in skin folds for protection Edema Control: 4 layer compression -  Bilateral Avoid standing for long periods of time Elevate legs to the level of the heart or above for 30 minutes daily and/or when sitting, a frequency of: - throughout the day. Exercise regularly Segmental Compressive Device. - use lymphedema pumps twice a day an hour each time. Once in the morning and once in the evening. #1 TCA 2. Silver alginate to the wounds ABDs under 4-layer compression 3. He is using his compression pumps at home twice a day 4 I have asked him to bring in his stockings so we can look at what we have to use on the right leg and make any adjustments or suggestions. 5. Once his wounds are healed he usually goes to the lymph clinic on [address] however my understanding is that her provider left hopefully they have a replacement Electronic Signature(s) Signed: 02/05/2020 5:51:19 PM By: Nicholas Ham MD Entered By: Nicholas [name] on 02/05/2020 14:02:08 -------------------------------------------------------------------------------- SuperBill Details Patient Name: Date of Service: Nicholas [name] DFO RD K. 02/05/2020 Medical Record Number: [id number] Patient Account Number: [id number] Date of Birth/Sex: Treating RN: October 07, 1945 (74 y.o. Nicholas [name] Primary Care Provider: Garret [name] Other Clinician: Referring Provider: Treating Provider/Extender: Nicholas [name] in Treatment: 4 Diagnosis Coding ICD-10 Codes Code Description I89.0 Lymphedema, not elsewhere classified L97.811 Non-pressure chronic ulcer of other part of right lower leg limited to breakdown of skin L97.821 Non-pressure chronic ulcer of other part of left lower leg limited to breakdown of skin Facility Procedures CPT4: Code [id number] Description: 81 BILATERAL: Application of multi-layer venous compression system; leg (below knee), including ankle and t. Modifier: Quantity: 1 Physician Procedures : CPT4 Code Description Modifier [id number] - WC PHYS LEVEL 3  - EST PT ICD-10 Diagnosis Description I89.0 Lymphedema, not elsewhere classified L97.811 Non-pressure chronic ulcer of other part of right lower leg limited to breakdown of skin L97.821  Non-pressure chronic ulcer of other part of left lower leg limited to breakdown of skin Quantity: 1 Electronic Signature(s) Signed: 02/05/2020 5:51:19 PM By: Nicholas Ham MD Signed: 02/09/2020 4:12:59 PM By: Levan Hurst RN, BSN Entered By: Levan Hurst on 02/05/2020 17:35:22

## 2020-02-12 ENCOUNTER — Other Ambulatory Visit (HOSPITAL_COMMUNITY)
Admission: RE | Admit: 2020-02-12 | Discharge: 2020-02-12 | Disposition: A | Discharge status: Home / Self Care | Payer: PPO | Source: Other Acute Inpatient Hospital | Attending: Internal Medicine | Admitting: Internal Medicine

## 2020-02-12 ENCOUNTER — Encounter (HOSPITAL_BASED_OUTPATIENT_CLINIC_OR_DEPARTMENT_OTHER): Payer: PPO | Admitting: Internal Medicine

## 2020-02-12 DIAGNOSIS — L97821 Non-pressure chronic ulcer of other part of left lower leg limited to breakdown of skin: Secondary | ICD-10-CM | POA: Diagnosis not present

## 2020-02-12 DIAGNOSIS — L97811 Non-pressure chronic ulcer of other part of right lower leg limited to breakdown of skin: Secondary | ICD-10-CM | POA: Diagnosis not present

## 2020-02-12 NOTE — Progress Notes (Signed)
[name] ([id number]) , Visit Report for 02/12/2020 HPI Details Patient Name: Date of Service: [name] DFO RD K. 02/12/2020 2:15 PM Medical Record Number: [id number] Patient Account Number: [id number] Date of Birth/Sex: Treating RN: [birth year] (74 y.o. [name] Primary Care Provider: [name] Other Clinician: Referring Provider: Treating Provider/Extender: [name] in Treatment: 5 History of Present Illness HPI Description: ADMISSION 07/04/2018 This is a 74 year old man who has bilateral lymphedema felt to be secondary to previous treatment for malignant melanoma on his left toes. 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 Malignant melanoma was treated with immunotherapy. He is not had radiation. Sometime after this he developed edema in the left leg that spread into the right leg. He has bilateral compression pumps at home and he uses them twice a day and claims to be quite compliant. He also has a juxta lite type stocking at home which is apparently 60-year-old. He states that he fell in early October dislocating and fracturing his left shoulder. He slept in a recliner with his legs dependent. He was seen and followed at the rehab clinic at [name] and was having lymphedema wraps done by 1 of the PT who noted his open wounds last week and he has been referred here for management. The patient has 2 open wounds on the right s distal lower leg and one on the left anterior lower leg The patient is not a diabetic. 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 For one reason or another we could not get him set up with home health and he kept the wraps on all week which he generally seem to tolerate. He did not use his compression pumps over the top of the wraps which I told him he could do this week. He comes in with a new rash on both feet which looks like tinea 07/22/2018; much better edema control bilaterally. The wounds on the right lateral calf and left anterior lower leg both look a lot better. There is epithelialization over both surfaces but I think this is fragile and I am going to put him in our compression wraps for another week. He says he is been using his compression pumps at home. He has a constellation of lower extremity compression garments that he is going to bring in next week. In discussion with him I cannot really get a sense of everything he has. I think he was at one point prescribed 30-40 below-knee stockings but he points out he could not get them on. 07/29/2018; the patient arrives in his legs are healed. He has a constellation of stockings none of which I think are going to be that helpful except for the fact that he has external wraparound stockings of some description. He has above-knee stockings but I cannot imagine it would be possible to get these on READMISSION 01/08/2020 Patient is now a 74 year old man. We had him for a short period from December 2019 through January 2020. He has bilateral lower extremity lymphedema which he initially traces to treatment for malignant melanoma 7 years ago. He has been followed at the lymphedema clinic on  [name] and recently has compression pumps that go up to his mid abdomen. He recently developed superficial wounds bilaterally. They will not manage these in the lymphedema clinic where the services are run by therapy. He has not been using his compression pumps. His significant other cannot get the stockings on his legs and the patient is only limited ability to help because  of Parkinson's disease Past medical history is essentially unchanged. He has sleep apnea, Parkinson's disease at some point labeled his Parkinson's plus, bilateral lymphedema, history of malignant melanoma. He does not have an arterial issue that I am aware of although his previous arterial studies have been noncompressible 7/1; patient we admitted the clinic 2 weeks ago. He has severe bilateral lymphedema. He had wounds predominantly on the left leg but also superficially on the right. He has compression pumps which he says he is using once a day. 7/15; 2-week follow-up. He comes in for nurse visit. He has severe bilateral lymphedema. He has upper abdominal level compression pumps which she is using twice a day. We have been wrapping both his lower legs the areas on the right leg are healed. He still has an area of left upper lateral and right anterior medial. Anterior medial wound is small. He is tolerating this well. 7/22; still significant lymphedema even with 4-layer compression and external compression pump usage. He said he had some itching on the right anterior leg he comes in with a large superficial area of skin breakdown anteriorly and a smaller area laterally on the right leg. He also has a rash on the left dorsal ankle crease. Some of these look pustular. I wonder if this is an occlusion issue from foam we put down here. He also states the only thing different this week is the did a 5-hour car ride with his legs dependent. There was nothing open on the right leg last week but we wrapped it in any case Electronic Signature(s) Signed: 02/12/2020 5:29:29 PM By: Linton Ham MD Entered By: Linton Ham on 02/12/2020 16:18:57 -------------------------------------------------------------------------------- Physical Exam Details Patient Name: Date of Service: [name] DFO RD K. 02/12/2020 2:15 PM Medical Record Number: [id number] Patient Account Number: [id number] Date of Birth/Sex:  Treating RN: 07-08-1946 (74 y.o. [name] Primary Care Provider: Gar[name] Other Clinician: Referring Provider: Treating Provider/Extender: [name] in Treatment: 5 Constitutional Sitting or standing Blood Pressure is within target range for patient.. Pulse regular and within target range for patient.Marland Kitchen Respirations regular, non-labored and within target range.. Temperature is normal and within the target range for the patient.Marland Kitchen Appears in no distress. Notes Wound exam; severe lymphedema bilaterally, chronic inflammation in the right greater than left leg. Large wound on the right anterior leg and a smaller wound on the right lateral leg just above the ankle area. There is no evidence of cellulitis. I could not see an open area on the left leg however he has a rash on the left dorsal ankle and proximal left foot. These are small raised blisters some of them look pustular. I have cultured 1 of these for CandS but this does not look like cellulitis Electronic Signature(s) Signed: 02/12/2020 5:29:29 PM By: Linton Ham MD Entered By: Linton Ham on 02/12/2020 16:24:14 -------------------------------------------------------------------------------- Physician Orders Details Patient Name: Date of Service: [name] DFO RD K. 02/12/2020 2:15 PM Medical Record Number: [id number] Patient Account Number: [id number] Date of Birth/Sex: Treating RN: Dec 02, 1945 (74 y.o. [name] Primary Care Provider:  [name] Other Clinician: Referring Provider: Treating Provider/Extender: [name] in Treatment: 5 Verbal / Phone Orders: No Diagnosis Coding ICD-10 Coding Code Description I89.0 Lymphedema, not elsewhere classified L97.811 Non-pressure chronic ulcer of other part of right lower leg limited to breakdown of skin L97.821 Non-pressure chronic ulcer of other part of left lower leg limited to breakdown of  skin Follow-up Appointments ppointment in: - Monday or Tuesday Return A Dressing Change Frequency Do not change entire dressing for one week. - all wounds Skin Barriers/Peri-Wound Care Moisturizing lotion TCA Cream or Ointment - mixed with lotion to both legs liberally Wound Cleansing May shower with protection. - with cast protectors. Primary Wound Dressing Wound #10 Right,Lateral Lower Leg Calcium Alginate with Silver Wound #8 Left,Proximal Lower Leg Calcium Alginate with Silver Wound #9 Right,Anterior Lower Leg Calcium Alginate with Silver Secondary Dressing Wound #9 Right,Anterior Lower Leg ABD pad Zetuvit or Kerramax Wound #10 Right,Lateral Lower Leg ABD pad Wound #8 Left,Proximal Lower Leg ABD pad Edema Control 4 layer compression - Bilateral Avoid standing for long periods of time Elevate legs to the level of the heart or above for 30 minutes daily and/or when sitting, a frequency of: - throughout the day. Exercise regularly Segmental Compressive Device. - use lymphedema pumps twice a day an hour each time. Once in the morning and once in the evening. Laboratory naerobe culture (MICRO) - Pustule on left distal lower leg - (ICD10 L97.821 - Non-pressure Bacteria identified in Unspecified specimen by A chronic ulcer of other part of left lower leg limited to breakdown of skin) LOINC Code: 329-5 Convenience Name: Anerobic culture Electronic Signature(s) Signed: 02/12/2020 5:02:14 PM By: Levan Hurst RN, BSN Signed: 02/12/2020 5:29:29 PM By: Linton Ham MD Entered By: Levan Hurst on 02/12/2020 15:37:48 -------------------------------------------------------------------------------- Problem List Details Patient Name: Date of Service: [name] DFO RD K. 02/12/2020 2:15 PM Medical Record Number: [id number] Patient Account Number: [id number] Date of Birth/Sex: Treating RN: [birth year] (74 y.o. [name] Primary Care Provider: [name] Other  Clinician: Referring Provider: Treating Provider/Extender: [name] in Treatment: 5 Active Problems ICD-10 Encounter Code Description Active Date MDM Diagnosis I89.0 Lymphedema, not elsewhere classified 01/08/2020 No Yes L97.811 Non-pressure chronic ulcer of other part of right lower leg limited to breakdown 01/08/2020 No Yes of skin L97.821 Non-pressure chronic ulcer of other part of left lower leg limited to breakdown 01/08/2020 No Yes of skin Inactive Problems Resolved Problems Electronic Signature(s) Signed: 02/12/2020 5:29:29 PM By: Linton Ham MD Entered By: Linton Ham on 02/12/2020 16:16:08 -------------------------------------------------------------------------------- Progress Note Details Patient Name: Date of Service: [name] DFO RD K. 02/12/2020 2:15 PM Medical Record Number: [id number] Patient Account Number: [id number] Date of Birth/Sex: Treating RN: 03-17-1946 (74 y.o. [name] Primary Care Provider: [name] Other Clinician: Referring Provider: Treating Provider/Extender: [name] in Treatment: 5 Subjective History of Present Illness (HPI) ADMISSION 07/04/2018 This is a 74 year old man who has bilateral lymphedema felt to be secondary to previous treatment for malignant melanoma on his left toes. 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 Malignant melanoma was treated with immunotherapy. He is not had radiation. Sometime after this he developed edema in the left leg that spread into the right leg. He has bilateral compression pumps at home and he uses them twice a day and claims to be quite compliant. He also has a juxta lite type stocking at home  which is apparently 25-year-old. He states that he fell in early October dislocating and fracturing his left shoulder. He slept in a recliner with his legs dependent. He was seen and followed at the rehab  clinic at [facility] and was having lymphedema wraps done by 1 of the PT who noted his open wounds last week and he has been referred here for management. The patient has 2 open wounds on the right s distal lower leg and one on the left anterior lower leg The patient is not a diabetic. 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 For one reason or another we could not get him set up with home health and he kept the wraps on all week which he generally seem to tolerate. He did not use his compression pumps over the top of the wraps which I told him he could do this week. He comes in with a new rash on both feet which looks like tinea 07/22/2018; much better edema control bilaterally. The wounds on the right lateral calf and left anterior lower leg both look a lot better. There is epithelialization over both surfaces but I think this is fragile and I am going to put him in our compression wraps for another week. He says he is been using his compression pumps at home. ooHe has a constellation of lower extremity compression garments that he is going to bring in next week. In discussion with him I cannot really get a sense of everything he has. I think he was at one point prescribed 30-40 below-knee stockings but he points out he could not get them on. 07/29/2018; the patient arrives in his legs are healed. He has a constellation of stockings none of which I think are going to be that helpful except for the fact that he has external wraparound stockings of some description. He has above-knee stockings but I cannot imagine it would be possible to get  these on READMISSION 01/08/2020 Patient is now a 75 year old man. We had him for a short period from December 2019 through January 2020. He has bilateral lower extremity lymphedema which he initially traces to treatment for malignant melanoma 7 years ago. He has been followed at the lymphedema clinic on [name] and recently has compression pumps that go up to his mid abdomen. He recently developed superficial wounds bilaterally. They will not manage these in the lymphedema clinic where the services are run by therapy. He has not been using his compression pumps. His significant other cannot get the stockings on his legs and the patient is only limited ability to help because of Parkinson's disease Past medical history is essentially unchanged. He has sleep apnea, Parkinson's disease at some point labeled his Parkinson's plus, bilateral lymphedema, history of malignant melanoma. He does not have an arterial issue that I am aware of although his previous arterial studies have been noncompressible 7/1; patient we admitted the clinic 2 weeks ago. He has severe bilateral lymphedema. He had wounds predominantly on the left leg but also superficially on the right. He has compression pumps which he says he is using once a day. 7/15; 2-week follow-up. He comes in for nurse visit. He has severe bilateral  lymphedema. He has upper abdominal level compression pumps which she is using twice a day. We have been wrapping both his lower legs the areas on the right leg are healed. He still has an area of left upper lateral and right anterior medial. Anterior medial wound is small. He is tolerating this well. 7/22; still significant lymphedema even with 4-layer compression and external compression pump usage. He said he had some itching on the right anterior leg he comes in with a large superficial area of skin breakdown anteriorly and a smaller area laterally on the right leg. He also has a rash on the left  dorsal ankle crease. Some of these look pustular. I wonder if this is an occlusion issue from foam we put down here. He also states the only thing different this week is the did a 5-hour car ride with his legs dependent. There was nothing open on the right leg last week but we wrapped it in any case Objective Constitutional Sitting or standing Blood Pressure is within target range for patient.. Pulse regular and within target range for patient.Marland Kitchen Respirations regular, non-labored and within target range.. Temperature is normal and within the target range for the patient.Marland Kitchen Appears in no distress. Vitals Time Taken: 2:40 PM, Height: 67 in, Weight: 250 lbs, BMI: 39.2, Temperature: 97.9 F, Pulse: 82 bpm, Respiratory Rate: 19 breaths/min, Blood Pressure: 124/69 mmHg. General Notes: Wound exam; severe lymphedema bilaterally, chronic inflammation in the right greater than left leg. Large wound on the right anterior leg and a smaller wound on the right lateral leg just above the ankle area. There is no evidence of cellulitis. ooI could not see an open area on the left leg however he has a rash on the left dorsal ankle and proximal left foot. These are small raised blisters some of them look pustular. I have cultured 1 of these for CandS but this does not look like cellulitis Integumentary (Hair, Skin) Wound #10 status is Open. Original cause of wound was Gradually Appeared. The wound is located on the Right,Lateral Lower Leg. The wound measures 2.5cm length x 3.5cm width x 0.1cm depth; 6.872cm^2 area and 0.687cm^3 volume. There is Fat Layer (Subcutaneous Tissue) Exposed exposed. There is no tunneling or undermining noted. There is a medium amount of serosanguineous drainage noted. The wound margin is distinct with the outline attached to the wound base. There is large (67-100%) red, pink granulation within the wound bed. There is a small (1-33%) amount of necrotic tissue within the wound bed including  Adherent Slough. Wound #5 status is Healed - Epithelialized. Original cause of wound was Gradually Appeared. The wound is located on the Left,Medial Lower Leg. The wound measures 0cm length x 0cm width x 0cm depth; 0cm^2 area and 0cm^3 volume. There is no tunneling or undermining noted. There is a none present amount of drainage noted. The wound margin is distinct with the outline attached to the wound base. There is no granulation within the wound bed. There is no necrotic tissue within the wound bed. Wound #7 status is Healed - Epithelialized. Original cause of wound was Gradually Appeared. The wound is located on the Left,Proximal,Lateral Lower Leg. The wound measures 0cm length x 0cm width x 0cm depth; 0cm^2 area and 0cm^3 volume. There is no tunneling or undermining noted. There is a none present amount of drainage noted. The wound margin is distinct with the outline attached to the wound base. There is no granulation within the wound bed. There is no necrotic  tissue within the wound bed. Wound #8 status is Open. Original cause of wound was Gradually Appeared. The wound is located on the Left,Proximal Lower Leg. The wound measures 1cm length x 0.6cm width x 0.1cm depth; 0.471cm^2 area and 0.047cm^3 volume. There is Fat Layer (Subcutaneous Tissue) Exposed exposed. There is no tunneling or undermining noted. There is a small amount of serosanguineous drainage noted. The wound margin is distinct with the outline attached to the wound base. There is large (67-100%) pink granulation within the wound bed. There is no necrotic tissue within the wound bed. Wound #9 status is Open. Original cause of wound was Gradually Appeared. The wound is located on the Right,Anterior Lower Leg. The wound measures 5.9cm length x 4.9cm width x 0.1cm depth; 22.706cm^2 area and 2.271cm^3 volume. There is Fat Layer (Subcutaneous Tissue) Exposed exposed. There is no tunneling or undermining noted. There is a large amount of  serosanguineous drainage noted. The wound margin is distinct with the outline attached to the wound base. There is large (67-100%) red, pink granulation within the wound bed. There is a small (1-33%) amount of necrotic tissue within the wound bed including Adherent Slough. 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 There was a Four Layer Compression Therapy Procedure by Levan Hurst, RN. Post procedure Diagnosis Wound #10: Same as Pre-Procedure Wound #8 Pre-procedure diagnosis of Wound #8 is a Lymphedema located on the Left,Proximal Lower Leg . There was a Four Layer Compression Therapy Procedure by Levan Hurst, RN. Post procedure Diagnosis Wound #8: Same as Pre-Procedure Wound #9 Pre-procedure diagnosis of Wound #9 is a Lymphedema located on the Right,Anterior Lower Leg . There was a Four Layer Compression Therapy Procedure by Levan Hurst, RN. Post procedure Diagnosis Wound #9: Same as Pre-Procedure Plan Follow-up Appointments: Return Appointment in: - Monday or Tuesday Dressing Change Frequency: Do not change entire dressing for one week. - all wounds Skin Barriers/Peri-Wound Care: Moisturizing lotion TCA Cream or Ointment - mixed with lotion to both legs liberally Wound Cleansing: May shower with protection. - with cast protectors. Primary Wound Dressing: Wound #10 Right,Lateral Lower Leg: Calcium Alginate with Silver Wound #8 Left,Proximal Lower Leg: Calcium Alginate with Silver Wound #9 Right,Anterior Lower Leg: Calcium Alginate with Silver Secondary Dressing: Wound #9 Right,Anterior Lower Leg: ABD pad Zetuvit or Kerramax Wound #10 Right,Lateral  Lower Leg: ABD pad Wound #8 Left,Proximal Lower Leg: ABD pad Edema Control: 4 layer compression - Bilateral Avoid standing for long periods of time Elevate legs to the level of the heart or above for 30 minutes daily and/or when sitting, a frequency of: - throughout the day. Exercise regularly Segmental Compressive Device. - use lymphedema pumps twice a day an hour each time. Once in the morning and once in the evening. Laboratory ordered were: Anerobic culture - Pustule on left distal lower leg 1. We put silver alginate/Kerramax/ABD and 4-layer compression on the right leg 2. 4-layer compression on the left leg. I did not want occlusion or foam on the left ankle we will simply use gauze. 3. I cultured one of the pustules for CandS on the left ankle but I am doubtful this is cellulitiso Occlusion 4. Even with 4-layer compression and his pumps control of the  degree of lymphedema here is very difficult. 5. I am going to bring him back earlier next week so I can check the left ankle and condition of the new wounds on the right anterior and lateral leg Electronic Signature(s) Signed: 02/12/2020 5:29:29 PM By: Linton Ham MD Entered By: Linton Ham on 02/12/2020 16:26:02 -------------------------------------------------------------------------------- SuperBill Details Patient Name: Date of Service: [name] DFO RD K. 02/12/2020 Medical Record Number: [id number] Patient Account Number: [id number] Date of Birth/Sex: Treating RN: May 28, 1946 (74 y.o. [name] Primary Care Provider: [name] Other Clinician: Referring Provider: Treating Provider/Extender: [name] in Treatment: 5 Diagnosis Coding ICD-10 Codes Code Description I89.0 Lymphedema, not elsewhere classified L97.811 Non-pressure chronic ulcer of other part of right lower leg limited to breakdown of skin L97.821 Non-pressure chronic ulcer of other part of left lower leg limited  to breakdown of skin Facility Procedures CPT4: Code [id number] foot Description: 1 BILATERAL: Application of multi-layer venous compression system; leg (below knee), including ankle and . Modifier: Quantity: 1 Physician Procedures : CPT4 Code Description Modifier [id number] - WC PHYS LEVEL 3 - EST PT ICD-10 Diagnosis Description I89.0 Lymphedema, not elsewhere classified I89.0 Lymphedema, not elsewhere classified L97.811 Non-pressure chronic ulcer of other part of right lower  leg limited to breakdown of skin L97.821 Non-pressure chronic ulcer of other part of left lower leg limited to breakdown of skin Quantity: 1 Electronic Signature(s) Signed: 02/12/2020 5:02:14 PM By: Levan Hurst RN, BSN Signed: 02/12/2020 5:29:29 PM By: Linton Ham MD Entered By: Levan Hurst on 02/12/2020 16:53:42

## 2020-02-13 NOTE — Progress Notes (Signed)
BRYN PERKIN ([id number]) , Visit Report for 02/12/2020 Arrival Information Details Patient Name: Date of Service: Nicholas [name] DFO RD K. 02/12/2020 2:15 PM Medical Record Number: [id number] Patient Account Number: [id number] Date of Birth/Sex: Treating RN: [birth year] (74 y.o. Nicholas [name] Primary Care Jeanni Allshouse: [name] Other Clinician: Referring Kainoa Swoboda: Treating Nicholas [name]/Extender: [name] in Treatment: 5 Visit Information History Since Last Visit Added or deleted any medications: No Patient Arrived: Wheel Chair Any new allergies or adverse reactions: No Arrival Time: 14:43 Had a fall or experienced change in No Accompanied By: wife activities of daily living that may affect Transfer Assistance: EasyPivot Patient Lift risk of falls: Patient Identification Verified: Yes Signs or symptoms of abuse/neglect since last visito No Secondary Verification Process Completed: Yes Hospitalized since last visit: No Patient Requires Transmission-Based Precautions: No Implantable device outside of the clinic excluding No Patient Has Alerts: Yes cellular tissue based products placed in the center Patient Alerts: ABI: [place] 07/2018 since last visit: Has Dressing in Place as Prescribed: Yes Has Compression in Place as Prescribed: Yes Pain Present Now: No Electronic Signature(s) Signed: 02/12/2020 4:44:31 PM By: Kela Millin Entered By: Kela Millin on 02/12/2020 14:43:32 -------------------------------------------------------------------------------- Compression Therapy Details Patient Name: Date of Service: Nicholas [name] DFO RD K. 02/12/2020 2:15 PM Medical Record Number: [id number] Patient Account Number: [id number] Date of Birth/Sex: Treating RN: February 12, 1946 (73 y.o. Nicholas [name] Primary Care Nakaya Mishkin: [name] Other Clinician: Referring Cabella Kimm: Treating Breelynn Bankert/Extender: [name] in  Treatment: 5 Compression Therapy Performed for Wound Assessment: Wound #10 Right,Lateral Lower Leg Performed By: Clinician Levan Hurst, RN Compression Type: Four Layer Post Procedure Diagnosis Same as Pre-procedure Electronic Signature(s) Signed: 02/12/2020 5:02:14 PM By: Levan Hurst RN, BSN Entered By: Levan Hurst on 02/12/2020 15:39:39 -------------------------------------------------------------------------------- Compression Therapy Details Patient Name: Date of Service: Nicholas [name] DFO RD K. 02/12/2020 2:15 PM Medical Record Number: [id number] Patient Account Number: [id number] Date of Birth/Sex: Treating RN: [birth year] (74 y.o. Nicholas [name] Primary Care Atticus Lemberger: [name] Other Clinician: Referring Tucker Steedley: Treating Nicholas [name]/Extender: [name] in Treatment: 5 Compression Therapy Performed for Wound Assessment: Wound #8 Left,Proximal Lower Leg Performed By: Clinician Levan Hurst, RN Compression Type: Four Layer Post Procedure Diagnosis Same as Pre-procedure Electronic Signature(s) Signed: 02/12/2020 5:02:14 PM By: Levan Hurst RN, BSN Entered By: Levan Hurst on 02/12/2020 15:39:40 -------------------------------------------------------------------------------- Compression Therapy Details Patient Name: Date of Service: Nicholas [name] DFO RD K. 02/12/2020 2:15 PM Medical Record Number: [id number] Patient Account Number: [id number] Date of Birth/Sex: Treating RN: 04/30/1946 (74 y.o. Nicholas [name] Primary Care Jivan Symanski: [name] Other Clinician: Referring Bonnita Newby: Treating Nicholas [name]/Extender: [name] in Treatment: 5 Compression Therapy Performed for Wound Assessment: Wound #9 Right,Anterior Lower Leg Performed By: Clinician Levan Hurst, RN Compression Type: Four Layer Post Procedure Diagnosis Same as Pre-procedure Electronic Signature(s) Signed: 02/12/2020 5:02:14 PM By:  Levan Hurst RN, BSN Entered By: Levan Hurst on 02/12/2020 15:39:40 -------------------------------------------------------------------------------- Encounter Discharge Information Details Patient Name: Date of Service: Nicholas [name] DFO RD K. 02/12/2020 2:15 PM Medical Record Number: [id number] Patient Account Number: [id number] Date of Birth/Sex: Treating RN: [birth year] (74 y.o. Nicholas [name] Primary Care Maytte Jacot: [name] Other Clinician: Referring Arena Lindahl: Treating Nicholas [name]/Extender: [name] in Treatment: 5 Encounter Discharge Information Items Discharge Condition: Stable Ambulatory Status: Wheelchair Discharge Destination: Home Transportation: Private Auto Accompanied By: spouse Schedule Follow-up Appointment: Yes Clinical Summary of Care:  Patient Declined Electronic Signature(s) Signed: 02/13/2020 5:49:13 PM By: Baruch Gouty RN, BSN Entered By: Baruch Gouty on 02/12/2020 16:59:51 -------------------------------------------------------------------------------- Lower Extremity Assessment Details Patient Name: Date of Service: Nicholas [name] DFO RD K. 02/12/2020 2:15 PM Medical Record Number: [id number] Patient Account Number: [id number] Date of Birth/Sex: Treating RN: 12/29/1945 (74 y.o. Nicholas [name] Primary Care Nychelle Cassata: [name] Other Clinician: Referring Sheppard Luckenbach: Treating Nicholas [name]/Extender: [name] in Treatment: 5 Edema Assessment Assessed: Nicholas [name]: No] Nicholas [name]: No] Edema: [Left: Yes] [Right: Yes] Calf Left: Right: Point of Measurement: 40 cm From Medial Instep 50.5 cm 53 cm Ankle Left: Right: Point of Measurement: 19 cm From Medial Instep 43.5 cm 45 cm Vascular Assessment Pulses: Dorsalis Pedis Palpable: [Left:No] [Right:No] Electronic Signature(s) Signed: 02/12/2020 4:44:31 PM By: Kela Millin Entered By: Kela Millin on 02/12/2020  14:51:50 -------------------------------------------------------------------------------- Multi Wound Chart Details Patient Name: Date of Service: Nicholas [name] DFO RD K. 02/12/2020 2:15 PM Medical Record Number: [id number] Patient Account Number: [id number] Date of Birth/Sex: Treating RN: January 02, 1946 (74 y.o. Nicholas [name] Primary Care Nicholas [name]: [name] Other Clinician: Referring Yona Kosek: Treating Eletha Culbertson/Extender: [name] in Treatment: 5 Vital Signs Height(in): 85 Pulse(bpm): 70 Weight(lbs): 250 Blood Pressure(mmHg): 124/69 Body Mass Index(BMI): 39 Temperature(F): 97.9 Respiratory Rate(breaths/min): 19 Photos: [10:No Photos Right, Lateral Lower Leg] [5:No Photos Left, Medial Lower Leg] [7:No Photos Left, Proximal, Lateral Lower Leg] Wound Location: [10:Gradually Appeared] [5:Gradually Appeared] [7:Gradually Appeared] Wounding Event: [10:Lymphedema] [5:Lymphedema] [7:Lymphedema] Primary Etiology: [10:Lymphedema, Sleep Apnea,] [5:Lymphedema, Sleep Apnea,] [7:Lymphedema, Sleep Apnea,] Comorbid History: [10:Congestive Heart Failure, Hypertension, Peripheral Venous Disease, Received Chemotherapy 02/12/2020] [5:Congestive Heart Failure, Hypertension, Peripheral Venous Disease, Received Chemotherapy 12/03/2019] [7:Congestive Heart Failure,  Hypertension, Peripheral Venous Disease, Received Chemotherapy 12/03/2019] Date Acquired: [10:0] [5:5] [7:5] Weeks of Treatment: [10:Open] [5:Healed - Epithelialized] [7:Healed - Epithelialized] Wound Status: [10:2.5x3.5x0.1] [5:0x0x0] [7:0x0x0] Measurements L x W x D (cm) [10:6.872] [5:0] [7:0] A (cm) : rea [10:0.687] [5:0] [7:0] Volume (cm) : [10:N/A] [5:100.00%] [7:100.00%] % Reduction in Area: [10:N/A] [5:100.00%] [7:100.00%] % Reduction in Volume: [10:Full Thickness Without Exposed] [5:Full Thickness Without Exposed] [7:Full Thickness Without Exposed] Classification: [10:Support Structures Medium]  [5:Support Structures None Present] [7:Support Structures None Present] Exudate Amount: [10:Serosanguineous] [5:N/A] [7:N/A] Exudate Type: [10:red, brown] [5:N/A] [7:N/A] Exudate Color: [10:Distinct, outline attached] [5:Distinct, outline attached] [7:Distinct, outline attached] Wound Margin: [10:Large (67-100%)] [5:None Present (0%)] [7:None Present (0%)] Granulation Amount: [10:Red, Pink] [5:N/A] [7:N/A] Granulation Quality: [10:Small (1-33%)] [5:None Present (0%)] [7:None Present (0%)] Necrotic Amount: [10:Fat Layer (Subcutaneous Tissue)] [5:Fascia: No] [7:Fascia: No] Exposed Structures: [10:Exposed: Yes Fascia: No Tendon: No Muscle: No Joint: No Bone: No None] [5:Fat Layer (Subcutaneous Tissue) Exposed: No Tendon: No Muscle: No Joint: No Bone: No Large (67-100%)] [7:Fat Layer (Subcutaneous Tissue) Exposed: No Tendon: No Muscle: No  Joint: No Bone: No Large (67-100%)] Epithelialization: [10:Compression Therapy] [5:N/A] [7:N/A] Wound Number: 8 9 N/A Photos: No Photos No Photos N/A Left, Proximal Lower Leg Right, Anterior Lower Leg N/A Wound Location: Gradually Appeared Gradually Appeared N/A Wounding Event: Lymphedema Lymphedema N/A Primary Etiology: Lymphedema, Sleep Apnea, Lymphedema, Sleep Apnea, N/A Comorbid History: Congestive Heart Failure, Congestive Heart Failure, Hypertension, Peripheral Venous Hypertension, Peripheral Venous Disease, Received Chemotherapy Disease, Received Chemotherapy 02/12/2020 02/12/2020 N/A Date Acquired: 0 0 N/A Weeks of Treatment: Open Open N/A Wound Status: 1x0.6x0.1 5.9x4.9x0.1 N/A Measurements L x W x D (cm) 0.471 22.706 N/A A (cm) : rea 0.047 2.271 N/A Volume (cm) : N/A N/A N/A % Reduction in Area: N/A N/A N/A %  Reduction in Volume: Full Thickness Without Exposed Full Thickness Without Exposed N/A Classification: Support Structures Support Structures Small Large N/A Exudate Amount: Serosanguineous Serosanguineous N/A Exudate  Type: red, brown red, brown N/A Exudate Color: Distinct, outline attached Distinct, outline attached N/A Wound Margin: Large (67-100%) Large (67-100%) N/A Granulation Amount: Pink Red, Pink N/A Granulation Quality: None Present (0%) Small (1-33%) N/A Necrotic Amount: Fat Layer (Subcutaneous Tissue) Fat Layer (Subcutaneous Tissue) N/A Exposed Structures: Exposed: Yes Exposed: Yes Fascia: No Fascia: No Tendon: No Tendon: No Muscle: No Muscle: No Joint: No Joint: No Bone: No Bone: No None None N/A Epithelialization: Compression Therapy Compression Therapy N/A Procedures Performed: Treatment Notes Electronic Signature(s) Signed: 02/12/2020 5:02:14 PM By: Levan Hurst RN, BSN Signed: 02/12/2020 5:29:29 PM By: Linton Ham MD Entered By: Linton Ham on 02/12/2020 16:16:19 -------------------------------------------------------------------------------- Multi-Disciplinary Care Plan Details Patient Name: Date of Service: Nicholas [name] DFO RD K. 02/12/2020 2:15 PM Medical Record Number: [id number] Patient Account Number: [id number] Date of Birth/Sex: Treating RN: [birth year] (73 y.o. Nicholas [name] Primary Care Mearl Olver: [name] Other Clinician: Referring Anesha Hackert: Treating Jazae Gandolfi/Extender: [name] in Treatment: 5 Active Inactive Venous Leg Ulcer Nursing Diagnoses: Potential for venous Insuffiency (use before diagnosis confirmed) Goals: Patient will maintain optimal edema control Date Initiated: 01/08/2020 Target Resolution Date: 02/27/2020 Goal Status: Active Patient/caregiver will verbalize understanding of disease process and disease management Date Initiated: 01/08/2020 Date Inactivated: 02/05/2020 Target Resolution Date: 01/30/2020 Goal Status: Met Interventions: Assess peripheral edema status every visit. Notes: Wound/Skin Impairment Nursing Diagnoses: Knowledge deficit related to ulceration/compromised skin  integrity Goals: Patient/caregiver will verbalize understanding of skin care regimen Date Initiated: 01/08/2020 Target Resolution Date: 02/27/2020 Goal Status: Active Ulcer/skin breakdown will have a volume reduction of 30% by week 4 Date Initiated: 01/08/2020 Date Inactivated: 02/05/2020 Target Resolution Date: 01/30/2020 Goal Status: Met Interventions: Assess patient/caregiver ability to obtain necessary supplies Assess patient/caregiver ability to perform ulcer/skin care regimen upon admission and as needed Provide education on ulcer and skin care Treatment Activities: Skin care regimen initiated : 01/08/2020 Topical wound management initiated : 01/08/2020 Notes: Electronic Signature(s) Signed: 02/12/2020 5:02:14 PM By: Levan Hurst RN, BSN Entered By: Levan Hurst on 02/12/2020 16:52:50 -------------------------------------------------------------------------------- Pain Assessment Details Patient Name: Date of Service: Nicholas [name] DFO RD K. 02/12/2020 2:15 PM Medical Record Number: [id number] Patient Account Number: [id number] Date of Birth/Sex: Treating RN: 08-02-1945 (74 y.o. Nicholas [name] Primary Care Aislyn Hayse: [name] Other Clinician: Referring Alexandro Line: Treating Eligio Angert/Extender: [name] in Treatment: 5 Active Problems Location of Pain Severity and Description of Pain Patient Has Paino No Site Locations Pain Management and Medication Current Pain Management: Electronic Signature(s) Signed: 02/12/2020 4:44:31 PM By: Kela Millin Entered By: Kela Millin on 02/12/2020 14:45:11 -------------------------------------------------------------------------------- Patient/Caregiver Education Details Patient Name: Date of Service: Nicholas [name] DFO RD K. 7/22/2021andnbsp2:15 PM Medical Record Number: [id number] Patient Account Number: [id number] Date of Birth/Gender: Treating RN: [birth year] (73 y.o. Nicholas [name] Primary Care Physician: [name] Other Clinician: Referring Physician: Treating Physician/Extender: [name] in Treatment: 5 Education Assessment Education Provided To: Patient Education Topics Provided Wound/Skin Impairment: Methods: Explain/Verbal Responses: State content correctly Electronic Signature(s) Signed: 02/12/2020 5:02:14 PM By: Levan Hurst RN, BSN Entered By: Levan Hurst on 02/12/2020 16:53:05 -------------------------------------------------------------------------------- Wound Assessment Details Patient Name: Date of Service: Nicholas [name] DFO RD K. 02/12/2020 2:15 PM Medical Record Number: [id number] Patient Account Number: [id number] Date of Birth/Sex: Treating RN: 07-09-1946 (73 y.o.  Nicholas [name] Primary Care Stockton Nunley: [name] Other Clinician: Referring Ambrosia Wisnewski: Treating Joell Usman/Extender: [name] in Treatment: 5 Wound Status Wound Number: 10 Primary Lymphedema Etiology: Wound Location: Right, Lateral Lower Leg Wound Open Wounding Event: Gradually Appeared Status: Date Acquired: 02/12/2020 Comorbid Lymphedema, Sleep Apnea, Congestive Heart Failure, Weeks Of Treatment: 0 History: Hypertension, Peripheral Venous Disease, Received Chemotherapy Clustered Wound: No Photos Photo Uploaded By: Mikeal Hawthorne on 02/13/2020 13:53:15 Wound Measurements Length: (cm) 2.5 Width: (cm) 3.5 Depth: (cm) 0.1 Area: (cm) 6.872 Volume: (cm) 0.687 % Reduction in Area: % Reduction in Volume: Epithelialization: None Tunneling: No Undermining: No Wound Description Classification: Full Thickness Without Exposed Support Structu Wound Margin: Distinct, outline attached Exudate Amount: Medium Exudate Type: Serosanguineous Exudate Color: red, brown res Foul Odor After Cleansing: No Slough/Fibrino Yes Wound Bed Granulation Amount: Large (67-100%) Exposed  Structure Granulation Quality: Red, Pink Fascia Exposed: No Necrotic Amount: Small (1-33%) Fat Layer (Subcutaneous Tissue) Exposed: Yes Necrotic Quality: Adherent Slough Tendon Exposed: No Muscle Exposed: No Joint Exposed: No Bone Exposed: No Treatment Notes Wound #10 (Right, Lateral Lower Leg) 2. Periwound Care Barrier cream Moisturizing lotion TCA Cream 3. Primary Dressing Applied Calcium Alginate Ag 4. Secondary Dressing ABD Pad Other secondary dressing (specify in notes) 6. Support Layer Applied 4 layer compression wrap Notes zetuvit Electronic Signature(s) Signed: 02/12/2020 4:44:31 PM By: Kela Millin Entered By: Kela Millin on 02/12/2020 14:59:56 -------------------------------------------------------------------------------- Wound Assessment Details Patient Name: Date of Service: Nicholas [name] DFO RD K. 02/12/2020 2:15 PM Medical Record Number: [id number] Patient Account Number: [id number] Date of Birth/Sex: Treating RN: 06-Nov-1945 (74 y.o. Nicholas [name] Primary Care Teniqua Marron: [name] Other Clinician: Referring Nhyira Leano: Treating Ramir Malerba/Extender: [name] in Treatment: 5 Wound Status Wound Number: 5 Primary Lymphedema Etiology: Wound Location: Left, Medial Lower Leg Wound Healed - Epithelialized Wounding Event: Gradually Appeared Status: Date Acquired: 12/03/2019 Comorbid Lymphedema, Sleep Apnea, Congestive Heart Failure, Weeks Of Treatment: 5 History: Hypertension, Peripheral Venous Disease, Received Chemotherapy Clustered Wound: No Photos Photo Uploaded By: Mikeal Hawthorne on 02/13/2020 13:52:03 Wound Measurements Length: (cm) Width: (cm) Depth: (cm) Area: (cm) Volume: (cm) 0 % Reduction in Area: 100% 0 % Reduction in Volume: 100% 0 Epithelialization: Large (67-100%) 0 Tunneling: No 0 Undermining: No Wound Description Classification: Full Thickness Without Exposed Support Structures Wound  Margin: Distinct, outline attached Exudate Amount: None Present Foul Odor After Cleansing: No Slough/Fibrino No Wound Bed Granulation Amount: None Present (0%) Exposed Structure Necrotic Amount: None Present (0%) Fascia Exposed: No Fat Layer (Subcutaneous Tissue) Exposed: No Tendon Exposed: No Muscle Exposed: No Joint Exposed: No Bone Exposed: No Electronic Signature(s) Signed: 02/12/2020 4:44:31 PM By: Kela Millin Signed: 02/12/2020 4:44:31 PM By: Kela Millin Entered By: Kela Millin on 02/12/2020 14:52:47 -------------------------------------------------------------------------------- Wound Assessment Details Patient Name: Date of Service: Nicholas [name] DFO RD K. 02/12/2020 2:15 PM Medical Record Number: [id number] Patient Account Number: [id number] Date of Birth/Sex: Treating RN: [birth year] (74 y.o. Nicholas [name] Primary Care Arijana Narayan: [name] Other Clinician: Referring Mustaf Antonacci: Treating Lean Fayson/Extender: [name] in Treatment: 5 Wound Status Wound Number: 7 Primary Lymphedema Etiology: Wound Location: Left, Proximal, Lateral Lower Leg Wound Healed - Epithelialized Wounding Event: Gradually Appeared Status: Date Acquired: 12/03/2019 Comorbid Lymphedema, Sleep Apnea, Congestive Heart Failure, Weeks Of Treatment: 5 History: Hypertension, Peripheral Venous Disease, Received Chemotherapy Clustered Wound: No Photos Photo Uploaded By: Mikeal Hawthorne on 02/13/2020 13:52:17 Wound Measurements Length: (cm) Width: (cm) Depth: (cm) Area: (cm) Volume: (cm) 0 %  Reduction in Area: 100% 0 % Reduction in Volume: 100% 0 Epithelialization: Large (67-100%) 0 Tunneling: No 0 Undermining: No Wound Description Classification: Full Thickness Without Exposed Support Structures Wound Margin: Distinct, outline attached Exudate Amount: None Present Foul Odor After Cleansing: No Slough/Fibrino No Wound Bed Granulation  Amount: None Present (0%) Exposed Structure Necrotic Amount: None Present (0%) Fascia Exposed: No Fat Layer (Subcutaneous Tissue) Exposed: No Tendon Exposed: No Muscle Exposed: No Joint Exposed: No Bone Exposed: No Electronic Signature(s) Signed: 02/12/2020 4:44:31 PM By: Kela Millin Entered By: Kela Millin on 02/12/2020 14:53:01 -------------------------------------------------------------------------------- Wound Assessment Details Patient Name: Date of Service: Nicholas [name] DFO RD K. 02/12/2020 2:15 PM Medical Record Number: [id number] Patient Account Number: [id number] Date of Birth/Sex: Treating RN: May 02, 1946 (74 y.o. Nicholas [name] Primary Care Jaisean Monteforte: [name] Other Clinician: Referring Todd Argabright: Treating Scott Vanderveer/Extender: [name] in Treatment: 5 Wound Status Wound Number: 8 Primary Lymphedema Etiology: Wound Location: Left, Proximal Lower Leg Wound Open Wounding Event: Gradually Appeared Status: Date Acquired: 02/12/2020 Comorbid Lymphedema, Sleep Apnea, Congestive Heart Failure, Weeks Of Treatment: 0 History: Hypertension, Peripheral Venous Disease, Received Chemotherapy Clustered Wound: No Photos Photo Uploaded By: Mikeal Hawthorne on 02/13/2020 13:53:15 Wound Measurements Length: (cm) 1 Width: (cm) 0.6 Depth: (cm) 0.1 Area: (cm) 0.471 Volume: (cm) 0.047 % Reduction in Area: % Reduction in Volume: Epithelialization: None Tunneling: No Undermining: No Wound Description Classification: Full Thickness Without Exposed Support Structu Wound Margin: Distinct, outline attached Exudate Amount: Small Exudate Type: Serosanguineous Exudate Color: red, brown res Foul Odor After Cleansing: No Slough/Fibrino No Wound Bed Granulation Amount: Large (67-100%) Exposed Structure Granulation Quality: Pink Fascia Exposed: No Necrotic Amount: None Present (0%) Fat Layer (Subcutaneous Tissue) Exposed: Yes Tendon  Exposed: No Muscle Exposed: No Joint Exposed: No Bone Exposed: No Treatment Notes Wound #8 (Left, Proximal Lower Leg) 2. Periwound Care Barrier cream Moisturizing lotion TCA Cream 3. Primary Dressing Applied Calcium Alginate Ag 4. Secondary Dressing ABD Pad Other secondary dressing (specify in notes) 6. Support Layer Applied 4 layer compression wrap Notes zetuvit Electronic Signature(s) Signed: 02/12/2020 4:44:31 PM By: Kela Millin Entered By: Kela Millin on 02/12/2020 14:57:07 -------------------------------------------------------------------------------- Wound Assessment Details Patient Name: Date of Service: Nicholas [name] DFO RD K. 02/12/2020 2:15 PM Medical Record Number: [id number] Patient Account Number: [id number] Date of Birth/Sex: Treating RN: 01/22/1946 (74 y.o. Nicholas [name] Primary Care Quency Tober: [name] Other Clinician: Referring Felita Bump: Treating Tamula Morrical/Extender: [name] in Treatment: 5 Wound Status Wound Number: 9 Primary Lymphedema Etiology: Wound Location: Right, Anterior Lower Leg Wound Open Wounding Event: Gradually Appeared Status: Date Acquired: 02/12/2020 Comorbid Lymphedema, Sleep Apnea, Congestive Heart Failure, Weeks Of Treatment: 0 History: Hypertension, Peripheral Venous Disease, Received Chemotherapy Clustered Wound: No Photos Photo Uploaded By: Mikeal Hawthorne on 02/13/2020 13:52:39 Wound Measurements Length: (cm) 5.9 Width: (cm) 4.9 Depth: (cm) 0.1 Area: (cm) 22.706 Volume: (cm) 2.271 % Reduction in Area: % Reduction in Volume: Epithelialization: None Tunneling: No Undermining: No Wound Description Classification: Full Thickness Without Exposed Support Struct Wound Margin: Distinct, outline attached Exudate Amount: Large Exudate Type: Serosanguineous Exudate Color: red, brown ures Foul Odor After Cleansing: No Slough/Fibrino Yes Wound Bed Granulation Amount: Large  (67-100%) Exposed Structure Granulation Quality: Red, Pink Fascia Exposed: No Necrotic Amount: Small (1-33%) Fat Layer (Subcutaneous Tissue) Exposed: Yes Necrotic Quality: Adherent Slough Tendon Exposed: No Muscle Exposed: No Joint Exposed: No Bone Exposed: No Treatment Notes Wound #9 (Right, Anterior Lower Leg) 2. Periwound Care Barrier cream Moisturizing  lotion TCA Cream 3. Primary Dressing Applied Calcium Alginate Ag 4. Secondary Dressing ABD Pad Other secondary dressing (specify in notes) 6. Support Layer Applied 4 layer compression wrap Notes zetuvit Electronic Signature(s) Signed: 02/12/2020 4:44:31 PM By: Kela Millin Entered By: Kela Millin on 02/12/2020 14:58:46 -------------------------------------------------------------------------------- Vitals Details Patient Name: Date of Service: Nicholas [name] DFO RD K. 02/12/2020 2:15 PM Medical Record Number: [id number] Patient Account Number: [id number] Date of Birth/Sex: Treating RN: October 13, 1945 (74 y.o. Nicholas [name] Primary Care Eliott Amparan: [name] Other Clinician: Referring Cicilia Clinger: Treating Avaleigh Decuir/Extender: [name] in Treatment: 5 Vital Signs Time Taken: 14:40 Temperature (F): 97.9 Height (in): 67 Pulse (bpm): 82 Weight (lbs): 250 Respiratory Rate (breaths/min): 19 Body Mass Index (BMI): 39.2 Blood Pressure (mmHg): 124/69 Reference Range: 80 - 120 mg / dl Electronic Signature(s) Signed: 02/12/2020 4:44:31 PM By: Kela Millin Entered By: Kela Millin on 02/12/2020 14:45:01

## 2020-02-15 LAB — AEROBIC CULTURE W GRAM STAIN (SUPERFICIAL SPECIMEN): Gram Stain: NONE SEEN

## 2020-02-17 ENCOUNTER — Encounter (HOSPITAL_BASED_OUTPATIENT_CLINIC_OR_DEPARTMENT_OTHER): Payer: PPO | Admitting: Internal Medicine

## 2020-02-17 DIAGNOSIS — I890 Lymphedema, not elsewhere classified: Secondary | ICD-10-CM | POA: Diagnosis not present

## 2020-02-17 DIAGNOSIS — L97821 Non-pressure chronic ulcer of other part of left lower leg limited to breakdown of skin: Secondary | ICD-10-CM | POA: Diagnosis not present

## 2020-02-17 DIAGNOSIS — L97811 Non-pressure chronic ulcer of other part of right lower leg limited to breakdown of skin: Secondary | ICD-10-CM | POA: Diagnosis not present

## 2020-02-18 NOTE — Progress Notes (Signed)
Nicholas [name] ([id number]) , Visit Report for 02/17/2020 HPI Details Patient Name: Date of Service: Nicholas [name] DFO RD K. 02/17/2020 3:45 PM Medical Record Number: [id number] Patient Account Number: [id number] Date of Birth/Sex: Treating RN: Nov 23, 1945 (74 y.o. Nicholas [name]) Carlene Coria Primary Care Provider: [name] Other Clinician: Referring Provider: Treating Provider/Extender: [name] in Treatment: 5 History of Present Illness HPI Description: ADMISSION 07/04/2018 This is a 74 year old man who has bilateral lymphedema felt to be secondary to previous treatment for malignant melanoma on his left toes. 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 Malignant melanoma was treated with immunotherapy. He is not had radiation. Sometime after this he developed edema in the left leg that spread into the right leg. He has bilateral compression pumps at home and he uses them twice a day and claims to be quite compliant. He also has a juxta lite type stocking at home which is apparently 27-year-old. He states that he fell in early October dislocating and fracturing his left shoulder. He slept in a recliner with his legs dependent. He was seen and followed at the rehab clinic at [facility] and was having lymphedema wraps done by 1 of the PT who noted his open wounds last week and he has been referred here for management. The patient has 2 open wounds on the right s distal lower leg and one on the left anterior lower leg The patient is not a diabetic. 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 For one reason or another we could not get him set up with home health and he kept the wraps on all week which he generally seem to tolerate. He did not use his compression pumps over the top of the wraps which I told him he could do this week. He comes in with a new rash on both feet which looks like tinea 07/22/2018; much better edema control bilaterally. The wounds on the right lateral calf and left anterior lower leg both look a lot better. There is epithelialization over both surfaces but I think this is fragile and I am going to put him in our compression wraps for another week. He says he is been using his compression pumps at home. He has a constellation of lower extremity compression garments that he is going to bring in next week. In discussion with him I cannot really get a sense of everything he has. I think he was at one point prescribed 30-40 below-knee stockings but he points out he could not get them on. 07/29/2018; the patient arrives in his legs are healed. He has a constellation of stockings none of which I think are going to be that helpful except for the fact that he has external wraparound stockings of some description. He has above-knee stockings but I cannot imagine it would be possible to get these on READMISSION 01/08/2020 Patient is now a 74 year old man. We had him for a short period from December 2019 through January 2020. He has bilateral lower extremity lymphedema which he initially traces to treatment for malignant melanoma 7 years ago. He has been followed at the lymphedema clinic on  [address] and recently has compression pumps that go up to his mid abdomen. He recently developed superficial wounds bilaterally. They will not manage these in the lymphedema clinic where the services are run by therapy. He has not been using his compression pumps. His significant other cannot get the stockings on his legs and the patient is only limited ability to help because  of Parkinson's disease Past medical history is essentially unchanged. He has sleep apnea, Parkinson's disease at some point labeled his Parkinson's plus, bilateral lymphedema, history of malignant melanoma. He does not have an arterial issue that I am aware of although his previous arterial studies have been noncompressible 7/1; patient we admitted the clinic 2 weeks ago. He has severe bilateral lymphedema. He had wounds predominantly on the left leg but also superficially on the right. He has compression pumps which he says he is using once a day. 7/15; 2-week follow-up. He comes in for nurse visit. He has severe bilateral lymphedema. He has upper abdominal level compression pumps which she is using twice a day. We have been wrapping both his lower legs the areas on the right leg are healed. He still has an area of left upper lateral and right anterior medial. Anterior medial wound is small. He is tolerating this well. 7/22; still significant lymphedema even with 4-layer compression and external compression pump usage. He said he had some itching on the right anterior leg he comes in with a large superficial area of skin breakdown anteriorly and a smaller area laterally on the right leg. He also has a rash on the left dorsal ankle crease. Some of these look pustular. I wonder if this is an occlusion issue from foam we put down here. He also states the only thing different this week is the did a 5-hour car ride with his legs dependent. There was nothing open on the right leg last week but we wrapped it in any case 7/27; culture of the blisters on his left anterior leg grew staph aureus. I gave him doxycycline which he started yesterday. He is already appear cleaner and dry. His major wound is on the right anterior lower leg and the right lateral lower leg. There is nothing else open on the left leg. He has better edema control today. Electronic Signature(s) Signed: 02/18/2020 3:52:22 AM By: Linton Ham MD Entered By: Linton Ham on 02/17/2020 17:12:51 -------------------------------------------------------------------------------- Physical Exam Details Patient Name: Date of Service: Nicholas [name] DFO RD K. 02/17/2020 3:45 PM Medical Record Number: [id number] Patient Account Number: [id number] Date of Birth/Sex: Treating RN: Apr 18, 1946 (73 y.o. [name] Primary Care Provider: [name] Other Clinician: Referring Provider: Treating Provider/Extender: [name] in Treatment: 5 Constitutional Patient is hypertensive.. Pulse regular and within target range for patient.Marland Kitchen Respirations regular, non-labored and within target range.. Temperature is normal and within the target range for the patient.Marland Kitchen Appears in no distress. Cardiovascular Pedal pulses are palpable. Notes Wound exam; severe lymphedema bilaterally but better edema control and less inflammation. Large wound on the right anterior leg is epithelializing. Area on the lateral right leg also looks better. The blisters on the left ankle some of which were pustular last week grew staph aureus he is on doxycycline now they already look better. Electronic Signature(s) Signed: 02/18/2020 3:52:22 AM By: Linton Ham MD Entered By: Linton Ham on 02/17/2020 17:16:36 -------------------------------------------------------------------------------- Physician Orders Details Patient Name: Date of Service: Nicholas [name] DFO RD K. 02/17/2020 3:45 PM Medical Record  Number: [id number] Patient Account Number: [id number] Date of Birth/Sex: Treating RN: 05-29-1946 (74 y.o. Nicholas [name]) Carlene Coria Primary Care Provider: [name] Other Clinician: Referring Provider: Treating Provider/Extender: [name] in Treatment: 5 Verbal / Phone Orders: No Diagnosis Coding Follow-up Appointments Return Appointment in 2 weeks. Nurse Visit: - next week Dressing Change Frequency Do  not change entire dressing for one week. - all wounds Skin Barriers/Peri-Wound Care Moisturizing lotion TCA Cream or Ointment - mixed with lotion to both legs liberally Wound Cleansing May shower with protection. - with cast protectors. Primary Wound Dressing Wound #10 Right,Lateral Lower Leg Calcium Alginate with Silver Wound #8 Left,Proximal Lower Leg Calcium Alginate with Silver Wound #9 Right,Anterior Lower Leg Calcium Alginate with Silver Secondary Dressing Wound #10 Right,Lateral Lower Leg ABD pad Wound #8 Left,Proximal Lower Leg ABD pad Wound #9 Right,Anterior Lower Leg ABD pad Zetuvit or Kerramax Edema Control 4 layer compression - Bilateral Avoid standing for long periods of time Elevate legs to the level of the heart or above for 30 minutes daily and/or when sitting, a frequency of: - throughout the day. Exercise regularly Segmental Compressive Device. - use lymphedema pumps twice a day an hour each time. Once in the morning and once in the evening. Electronic Signature(s) Signed: 02/18/2020 3:52:22 AM By: Linton Ham MD Signed: 02/18/2020 6:04:59 PM By: Carlene Coria RN Entered By: Carlene Coria on 02/17/2020 16:56:56 -------------------------------------------------------------------------------- Problem List Details Patient Name: Date of Service: Nicholas [name] DFO RD K. 02/17/2020 3:45 PM Medical Record Number: [id number] Patient Account Number: [id number] Date of Birth/Sex: Treating RN: July 12, 1946 (73 y.o. 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 02/17/2020 3:45 PM Medical Record Number: [id number] Patient Account Number: [id number] Date of Birth/Sex: Treating RN: 1946-07-03 (73 y.o. [name] Primary Care Provider: [name] Other Clinician: Referring Provider: Treating Provider/Extender: [name] in Treatment: 5 Subjective History of Present Illness (HPI) ADMISSION 07/04/2018 This is a 74 year old man who has bilateral lymphedema felt to be secondary to previous treatment for malignant melanoma on his left toes. 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 Malignant melanoma was treated with immunotherapy. He is not had radiation. Sometime after this he developed edema in the left leg that spread into the right leg. He has bilateral compression pumps at home and he uses them twice a day and claims to be quite compliant. He also has a juxta lite type stocking at home which is apparently 3-year-old. He states that he fell in early October dislocating and fracturing his left shoulder. He slept in a recliner with his legs dependent. He was seen and followed at the rehab clinic at [facility] and was having lymphedema wraps done by 1 of the PT who noted his open wounds last week and he has been referred here for  management. The patient has 2 open wounds on the right s distal lower leg and one on the left anterior lower leg The patient is not a diabetic. 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 For one reason or another we could not get him set up with home health and he kept the wraps on all week which he generally seem to tolerate. He did not use his compression pumps over the top of the wraps which I told him he could do this week. He comes in with a new rash on both feet which looks like tinea 07/22/2018; much better edema control bilaterally. The wounds on the right lateral calf and left anterior lower leg both look a lot better. There is epithelialization over both surfaces but I think this is fragile and I am going to put him in our compression wraps for another week. He says he is been using his compression pumps at home. ooHe has a constellation of lower extremity compression garments that he is going to bring in next week. In discussion with him I cannot really get a sense of everything he has. I think he was at one point prescribed 30-40 below-knee stockings but he points out he could not get them on. 07/29/2018; the patient arrives in his legs are healed. He has a constellation of stockings none of which I think are going to be that helpful except for the fact that he has external wraparound stockings of some description. He has above-knee stockings but I cannot imagine it would be possible to get these on READMISSION 01/08/2020 Patient is now a 74 year old man. We had him for a short period from December 2019 through January 2020. He has bilateral lower extremity lymphedema which he initially traces to treatment for malignant melanoma 7 years ago. He has been followed at the lymphedema clinic on [facility] and recently has compression pumps that go up to his mid abdomen. He recently developed  superficial wounds bilaterally. They will not manage these in the lymphedema clinic where the services are run by therapy. He has not been using his compression pumps. His significant other cannot get the stockings on his legs and the patient is only limited ability to help because of Parkinson's disease Past medical history is essentially unchanged. He has sleep apnea, Parkinson's disease at some point labeled his Parkinson's plus, bilateral lymphedema, history of malignant melanoma. He does not have an arterial issue that I am aware of although his previous arterial studies have been noncompressible 7/1; patient we admitted the clinic 2 weeks ago. He has severe bilateral lymphedema. He had wounds predominantly on the left leg but also superficially on the right. He has compression pumps which he says he is using once a day. 7/15; 2-week follow-up. He comes in for nurse visit. He has severe bilateral lymphedema. He has upper abdominal level compression pumps which she is using twice a day. We have been wrapping both his lower legs the areas on the right leg are healed. He still has an area of left upper lateral and right anterior medial. Anterior medial wound is small. He is tolerating this well. 7/22; still significant lymphedema even with 4-layer compression and external  compression pump usage. He said he had some itching on the right anterior leg he comes in with a large superficial area of skin breakdown anteriorly and a smaller area laterally on the right leg. He also has a rash on the left dorsal ankle crease. Some of these look pustular. I wonder if this is an occlusion issue from foam we put down here. He also states the only thing different this week is the did a 5-hour car ride with his legs dependent. There was nothing open on the right leg last week but we wrapped it in any case 7/27; culture of the blisters on his left anterior leg grew staph aureus. I gave him doxycycline which he  started yesterday. He is already appear cleaner and dry. His major wound is on the right anterior lower leg and the right lateral lower leg. There is nothing else open on the left leg. He has better edema control today. Objective Constitutional Patient is hypertensive.. Pulse regular and within target range for patient.Marland Kitchen Respirations regular, non-labored and within target range.. Temperature is normal and within the target range for the patient.Marland Kitchen Appears in no distress. Vitals Time Taken: 4:00 PM, Height: 67 in, Source: Stated, Weight: 250 lbs, Source: Stated, BMI: 39.2, Temperature: 98 F, Pulse: 80 bpm, Respiratory Rate: 18 breaths/min, Blood Pressure: 149/90 mmHg. Cardiovascular Pedal pulses are palpable. General Notes: Wound exam; severe lymphedema bilaterally but better edema control and less inflammation. Large wound on the right anterior leg is epithelializing. Area on the lateral right leg also looks better. ooThe blisters on the left ankle some of which were pustular last week grew staph aureus he is on doxycycline now they already look better. Integumentary (Hair, Skin) Wound #10 status is Open. Original cause of wound was Gradually Appeared. The wound is located on the Right,Lateral Lower Leg. The wound measures 2.2cm length x 2.8cm width x 0.1cm depth; 4.838cm^2 area and 0.484cm^3 volume. There is Fat Layer (Subcutaneous Tissue) Exposed exposed. There is no tunneling or undermining noted. There is a medium amount of serous drainage noted. The wound margin is indistinct and nonvisible. There is large (67-100%) red granulation within the wound bed. There is no necrotic tissue within the wound bed. Wound #8 status is Open. Original cause of wound was Gradually Appeared. The wound is located on the Left,Proximal Lower Leg. The wound measures 0.6cm length x 0.8cm width x 0.1cm depth; 0.377cm^2 area and 0.038cm^3 volume. There is Fat Layer (Subcutaneous Tissue) Exposed exposed. There is  no tunneling or undermining noted. There is a small amount of serous drainage noted. The wound margin is flat and intact. There is large (67-100%) red granulation within the wound bed. There is no necrotic tissue within the wound bed. Wound #9 status is Open. Original cause of wound was Gradually Appeared. The wound is located on the Right,Anterior Lower Leg. The wound measures 2.7cm length x 0.6cm width x 0.1cm depth; 1.272cm^2 area and 0.127cm^3 volume. There is Fat Layer (Subcutaneous Tissue) Exposed exposed. There is no tunneling or undermining noted. There is a medium amount of serous drainage noted. The wound margin is indistinct and nonvisible. There is large (67-100%) red granulation within the wound bed. There is no necrotic tissue within the wound bed. 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 There was a Four Layer Compression Therapy Procedure by Carlene Coria, RN. Post procedure Diagnosis Wound #10: Same as Pre-Procedure Wound #8 Pre-procedure diagnosis of Wound #8 is a Lymphedema located on the Left,Proximal Lower Leg . There was a Four Layer Compression Therapy Procedure by Carlene Coria, RN. Post procedure Diagnosis Wound #8: Same as Pre-Procedure Wound #9 Pre-procedure diagnosis of Wound #9 is a Lymphedema located on the Right,Anterior Lower Leg . There was a Four Layer Compression Therapy Procedure by Carlene Coria, RN. Post procedure Diagnosis Wound #9: Same as Pre-Procedure Plan Follow-up Appointments: Return Appointment in 2 weeks. Nurse Visit: - next week Dressing Change Frequency: Do not change entire dressing for one week. - all wounds Skin  Barriers/Peri-Wound Care: Moisturizing lotion TCA Cream or Ointment - mixed with lotion to both legs liberally Wound Cleansing: May shower with protection. - with cast protectors. Primary Wound Dressing: Wound #10 Right,Lateral Lower Leg: Calcium Alginate with Silver Wound #8 Left,Proximal Lower Leg: Calcium Alginate with Silver Wound #9 Right,Anterior Lower Leg: Calcium Alginate with Silver Secondary Dressing: Wound #10 Right,Lateral Lower Leg: ABD pad Wound #8 Left,Proximal Lower Leg: ABD pad Wound #9 Right,Anterior Lower Leg: ABD pad Zetuvit or Kerramax Edema Control: 4 layer compression - Bilateral Avoid standing for long periods of time Elevate legs to the level of the heart or above for 30 minutes daily and/or when sitting, a frequency of: - throughout the day. Exercise regularly Segmental Compressive Device. - use lymphedema pumps twice a day an hour each time. Once in the morning and once in the evening. 1. TCA 2. Continue with silver alginate to the wounds predominantly on the right. 3. ABDs still under 4-layer compression 4. We've asked him to bring a external compression stocking for the left leg next week. He already has compression pumps Electronic Signature(s) Signed: 02/18/2020 3:52:22 AM By: Linton Ham MD Entered By: Linton Ham on 02/17/2020 17:18:30 -------------------------------------------------------------------------------- SuperBill Details Patient Name: Date of Service: Nicholas [name] DFO RD K. 02/17/2020 Medical Record Number: [id number] Patient Account Number: [id number] Date of Birth/Sex: Treating RN: [birth year] (74 y.o. 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 Modifier: Quantity: 1 Physician Procedures : CPT4 Code Description Modifier [id number] - WC PHYS LEVEL 3 - EST PT ICD-10 Diagnosis Description I89.0 Lymphedema, not elsewhere classified L97.811 Non-pressure chronic ulcer of other part of right lower leg limited to breakdown of skin L97.821  Non-pressure chronic ulcer of other part of left lower leg limited to breakdown of skin Quantity: 1 Electronic Signature(s) Signed: 02/18/2020 3:52:22 AM By: Linton Ham MD Entered By: Linton Ham on 02/17/2020 17:18:49

## 2020-02-20 NOTE — Progress Notes (Signed)
NICHOLIS STEPANEK ([id number]) , Visit Report for 02/17/2020 Arrival Information Details Patient Name: Date of Service: [name] 02/17/2020 3:45 PM Medical Record Number: [id number] Patient Account Number: [id number] Date of Birth/Sex: Treating RN: 01-02-1946 (74 y.o. [name], [name] Primary Care Markell Sciascia: [name] Other Clinician: Referring Chana Lindstrom: Treating Maraki Macquarrie/Extender: [name] in Treatment: 5 Visit Information History Since Last Visit Added or deleted any medications: Yes Patient Arrived: Wheel Chair Any new allergies or adverse reactions: No Arrival Time: 15:59 Had a fall or experienced change in No Accompanied By: spouse activities of daily living that may affect Transfer Assistance: None risk of falls: Patient Identification Verified: Yes Signs or symptoms of abuse/neglect since last visito No Secondary Verification Process Completed: Yes Hospitalized since last visit: No Patient Requires Transmission-Based Precautions: No Implantable device outside of the clinic excluding No Patient Has Alerts: Yes cellular tissue based products placed in the center Patient Alerts: ABI: [place] 07/2018 since last visit: Has Dressing in Place as Prescribed: Yes Has Compression in Place as Prescribed: Yes Pain Present Now: No Electronic Signature(s) Signed: 02/17/2020 5:47:23 PM By: Baruch Gouty RN, BSN Entered By: Baruch Gouty on 02/17/2020 16:16:11 -------------------------------------------------------------------------------- Compression Therapy Details Patient Name: Date of Service: [name] 02/17/2020 3:45 PM Medical Record Number: [id number] Patient Account Number: [id number] Date of Birth/Sex: Treating RN: [birth year] (73 y.o. [name] Primary Care Keo Schirmer: [name] Other Clinician: Referring Simi Briel: Treating Rozalyn Osland/Extender: [name] in Treatment:  5 Compression Therapy Performed for Wound Assessment: Wound #10 Right,Lateral Lower Leg Performed By: Clinician Carlene Coria, RN Compression Type: Four Layer Post Procedure Diagnosis Same as Pre-procedure Electronic Signature(s) Signed: 02/18/2020 6:04:59 PM By: Carlene Coria RN Entered By: Carlene Coria on 02/17/2020 17:08:07 -------------------------------------------------------------------------------- Compression Therapy Details Patient Name: Date of Service: [name] 02/17/2020 3:45 PM Medical Record Number: [id number] Patient Account Number: [id number] Date of Birth/Sex: Treating RN: 27-Nov-1945 (73 y.o. [name] Primary Care Khylie Larmore: [name] Other Clinician: Referring Earlie Schank: Treating Opel Lejeune/Extender: [name] in Treatment: 5 Compression Therapy Performed for Wound Assessment: Wound #8 Left,Proximal Lower Leg Performed By: Clinician Carlene Coria, RN Compression Type: Four Layer Post Procedure Diagnosis Same as Pre-procedure Electronic Signature(s) Signed: 02/18/2020 6:04:59 PM By: Carlene Coria RN Entered By: Carlene Coria on 02/17/2020 17:08:07 -------------------------------------------------------------------------------- Compression Therapy Details Patient Name: Date of Service: [name] 02/17/2020 3:45 PM Medical Record Number: [id number] Patient Account Number: [id number] Date of Birth/Sex: Treating RN: 1945/11/20 (73 y.o. [name] Primary Care Margeaux Swantek: [name] Other Clinician: Referring Mckenleigh Tarlton: Treating Canon Gola/Extender: [name] in Treatment: 5 Compression Therapy Performed for Wound Assessment: Wound #9 Right,Anterior Lower Leg Performed By: Clinician Carlene Coria, RN Compression Type: Four Layer Post Procedure Diagnosis Same as Pre-procedure Electronic Signature(s) Signed: 02/18/2020 6:04:59 PM By: Carlene Coria RN Entered By: Carlene Coria  on 02/17/2020 17:08:07 -------------------------------------------------------------------------------- Encounter Discharge Information Details Patient Name: Date of Service: [name] 02/17/2020 3:45 PM Medical Record Number: [id number] Patient Account Number: [id number] Date of Birth/Sex: Treating RN: 1946/01/06 (74 y.o. [name] Primary Care Celestia Duva: [name] Other Clinician: Referring Damarri Rampy: Treating Keshawna Dix/Extender: [name] in Treatment: 5 Encounter Discharge Information Items Discharge Condition: Stable Ambulatory Status: Walker Discharge Destination: Home Transportation: Private Auto Accompanied By: wife Schedule Follow-up Appointment: Yes Clinical Summary of Care: Patient Declined Electronic  Signature(s) Signed: 02/20/2020 5:02:39 PM By: Kela Millin Entered By: Kela Millin on 02/17/2020 17:40:27 -------------------------------------------------------------------------------- Lower Extremity Assessment Details Patient Name: Date of Service: [name] 02/17/2020 3:45 PM Medical Record Number: [id number] Patient Account Number: [id number] Date of Birth/Sex: Treating RN: July 23, 1946 (74 y.o. [name] Primary Care Raziyah Vanvleck: [name] Other Clinician: Referring Corrinne Benegas: Treating Garret Teale/Extender: [name] in Treatment: 5 Edema Assessment Assessed: [name]: No] [name]: No] Edema: [Left: Yes] [Right: Yes] Calf Left: Right: Point of Measurement: 40 cm From Medial Instep 46 cm 47 cm Ankle Left: Right: Point of Measurement: 19 cm From Medial Instep 34 cm 38.3 cm Vascular Assessment Pulses: Dorsalis Pedis Palpable: [Left:No] [Right:No] Electronic Signature(s) Signed: 02/17/2020 5:47:23 PM By: Baruch Gouty RN, BSN Entered By: Baruch Gouty on 02/17/2020  16:19:26 -------------------------------------------------------------------------------- Multi Wound Chart Details Patient Name: Date of Service: [name] 02/17/2020 3:45 PM Medical Record Number: [id number] Patient Account Number: [id number] Date of Birth/Sex: Treating RN: May 25, 1946 (73 y.o. [name]) Carlene Coria Primary Care Izaiha Lo: [name] Other Clinician: Referring Allyssa Abruzzese: Treating Nikan Ellingson/Extender: [name] in Treatment: 5 Vital Signs Height(in): 83 Pulse(bpm): 80 Weight(lbs): 250 Blood Pressure(mmHg): 149/90 Body Mass Index(BMI): 39 Temperature(F): 98 Respiratory Rate(breaths/min): 18 Photos: [10:No Photos Right, Lateral Lower Leg] [8:No Photos Left, Proximal Lower Leg] [9:No Photos Right, Anterior Lower Leg] Wound Location: [10:Gradually Appeared] [8:Gradually Appeared] [9:Gradually Appeared] Wounding Event: [10:Lymphedema] [8:Lymphedema] [9:Lymphedema] Primary Etiology: [10:Lymphedema, Sleep Apnea,] [8:Lymphedema, Sleep Apnea,] [9:Lymphedema, Sleep Apnea,] Comorbid History: [10:Congestive Heart Failure, Hypertension, Peripheral Venous Disease, Received Chemotherapy 02/12/2020] [8:Congestive Heart Failure, Hypertension, Peripheral Venous Disease, Received Chemotherapy 02/12/2020] [9:Congestive Heart Failure,  Hypertension, Peripheral Venous Disease, Received Chemotherapy 02/12/2020] Date Acquired: [10:0] [8:0] [9:0] Weeks of Treatment: [10:Open] [8:Open] [9:Open] Wound Status: [10:2.2x2.8x0.1] [8:0.6x0.8x0.1] [9:2.7x0.6x0.1] Measurements L x W x D (cm) [10:4.838] [8:0.377] [9:1.272] A (cm) : rea [10:0.484] [8:0.038] [9:0.127] Volume (cm) : [10:29.60%] [8:20.00%] [9:94.40%] % Reduction in Area: [10:29.50%] [8:19.10%] [9:94.40%] % Reduction in Volume: [10:Full Thickness Without Exposed] [8:Full Thickness Without Exposed] [9:Full Thickness Without Exposed] Classification: [10:Support Structures Medium] [8:Support Structures  Small] [9:Support Structures Medium] Exudate Amount: [10:Serous] [8:Serous] [9:Serous] Exudate Type: [10:amber] [8:amber] [9:amber] Exudate Color: [10:Indistinct, nonvisible] [8:Flat and Intact] [9:Indistinct, nonvisible] Wound Margin: [10:Large (67-100%)] [8:Large (67-100%)] [9:Large (67-100%)] Granulation Amount: [10:Red] [8:Red] [9:Red] Granulation Quality: [10:None Present (0%)] [8:None Present (0%)] [9:None Present (0%)] Necrotic Amount: [10:Fat Layer (Subcutaneous Tissue)] [8:Fat Layer (Subcutaneous Tissue)] [9:Fat Layer (Subcutaneous Tissue)] Exposed Structures: [10:Exposed: Yes Fascia: No Tendon: No Muscle: No Joint: No Bone: No Large (67-100%)] [8:Exposed: Yes Fascia: No Tendon: No Muscle: No Joint: No Bone: No Small (1-33%)] [9:Exposed: Yes Fascia: No Tendon: No Muscle: No Joint: No Bone: No Large (67-100%)] Epithelialization: [10:Compression Therapy] [8:Compression Therapy] [9:Compression Therapy] Treatment Notes Electronic Signature(s) Signed: 02/18/2020 3:52:22 AM By: Linton Ham MD Signed: 02/18/2020 6:04:59 PM By: Carlene Coria RN Entered By: Linton Ham on 02/17/2020 17:08:16 -------------------------------------------------------------------------------- Multi-Disciplinary Care Plan Details Patient Name: Date of Service: [name] 02/17/2020 3:45 PM Medical Record Number: [id number] Patient Account Number: [id number] Date of Birth/Sex: Treating RN: 1945/09/19 (73 y.o. [name] Primary Care Corby Villasenor: [name] Other Clinician: Referring Davaun Quintela: Treating Leta Bucklin/Extender: [name] in Treatment: 5 Active Inactive Venous Leg Ulcer Nursing Diagnoses: Potential for venous Insuffiency (use before diagnosis confirmed) Goals: Patient will maintain optimal edema control Date Initiated: 01/08/2020 Target Resolution Date: 02/27/2020 Goal Status: Active Patient/caregiver will verbalize understanding of disease  process  and disease management Date Initiated: 01/08/2020 Date Inactivated: 02/05/2020 Target Resolution Date: 01/30/2020 Goal Status: Met Interventions: Assess peripheral edema status every visit. Notes: Wound/Skin Impairment Nursing Diagnoses: Knowledge deficit related to ulceration/compromised skin integrity Goals: Patient/caregiver will verbalize understanding of skin care regimen Date Initiated: 01/08/2020 Target Resolution Date: 02/27/2020 Goal Status: Active Ulcer/skin breakdown will have a volume reduction of 30% by week 4 Date Initiated: 01/08/2020 Date Inactivated: 02/05/2020 Target Resolution Date: 01/30/2020 Goal Status: Met Interventions: Assess patient/caregiver ability to obtain necessary supplies Assess patient/caregiver ability to perform ulcer/skin care regimen upon admission and as needed Provide education on ulcer and skin care Treatment Activities: Skin care regimen initiated : 01/08/2020 Topical wound management initiated : 01/08/2020 Notes: Electronic Signature(s) Signed: 02/18/2020 6:04:59 PM By: Carlene Coria RN Entered By: Carlene Coria on 02/17/2020 15:55:21 -------------------------------------------------------------------------------- Pain Assessment Details Patient Name: Date of Service: [name] 02/17/2020 3:45 PM Medical Record Number: [id number] Patient Account Number: [id number] Date of Birth/Sex: Treating RN: Jun 09, 1946 (74 y.o. [name] Primary Care Dmiya Malphrus: [name] Other Clinician: Referring Wylie Coon: Treating Ayush Boulet/Extender: [name] in Treatment: 5 Active Problems Location of Pain Severity and Description of Pain Patient Has Paino No Site Locations Rate the pain. Current Pain Level: 0 Pain Management and Medication Current Pain Management: Electronic Signature(s) Signed: 02/17/2020 5:47:23 PM By: Baruch Gouty RN, BSN Entered By: Baruch Gouty on 02/17/2020  16:17:02 -------------------------------------------------------------------------------- Patient/Caregiver Education Details Patient Name: Date of Service: [name] 7/27/2021andnbsp3:45 PM Medical Record Number: [id number] Patient Account Number: [id number] Date of Birth/Gender: Treating RN: 10/30/1945 (73 y.o. [name]) Carlene Coria Primary Care Physician: [name] Other Clinician: Referring Physician: Treating Physician/Extender: [name] in Treatment: 5 Education Assessment Education Provided To: Patient Education Topics Provided Wound/Skin Impairment: Methods: Explain/Verbal Responses: State content correctly Electronic Signature(s) Signed: 02/18/2020 6:04:59 PM By: Carlene Coria RN Entered By: Carlene Coria on 02/17/2020 15:56:27 -------------------------------------------------------------------------------- Wound Assessment Details Patient Name: Date of Service: [name] 02/17/2020 3:45 PM Medical Record Number: [id number] Patient Account Number: [id number] Date of Birth/Sex: Treating RN: 1945/08/26 (73 y.o. [name] Primary Care Edona Schreffler: [name] Other Clinician: Referring Keevon Henney: Treating Torben Soloway/Extender: [name] in Treatment: 5 Wound Status Wound Number: 10 Primary Lymphedema Etiology: Wound Location: Right, Lateral Lower Leg Wound Open Wounding Event: Gradually Appeared Status: Date Acquired: 02/12/2020 Comorbid Lymphedema, Sleep Apnea, Congestive Heart Failure, Weeks Of Treatment: 0 History: Hypertension, Peripheral Venous Disease, Received Chemotherapy Clustered Wound: No Photos Photo Uploaded By: Mikeal Hawthorne on 02/18/2020 13:37:48 Wound Measurements Length: (cm) 2.2 Width: (cm) 2.8 Depth: (cm) 0.1 Area: (cm) 4.838 Volume: (cm) 0.484 % Reduction in Area: 29.6% % Reduction in Volume: 29.5% Epithelialization: Large (67-100%) Tunneling:  No Undermining: No Wound Description Classification: Full Thickness Without Exposed Support Struct Wound Margin: Indistinct, nonvisible Exudate Amount: Medium Exudate Type: Serous Exudate Color: amber ures Foul Odor After Cleansing: No Slough/Fibrino No Wound Bed Granulation Amount: Large (67-100%) Exposed Structure Granulation Quality: Red Fascia Exposed: No Necrotic Amount: None Present (0%) Fat Layer (Subcutaneous Tissue) Exposed: Yes Tendon Exposed: No Muscle Exposed: No Joint Exposed: No Bone Exposed: No Treatment Notes Wound #10 (Right, Lateral Lower Leg) 1. Cleanse With Wound Cleanser Soap and water 2. Periwound Care Moisturizing lotion TCA Cream 3. Primary Dressing Applied Calcium Alginate Ag 4. Secondary Dressing ABD Pad 6. Support Layer Applied 4 layer compression wrap Notes unna boot to anchor, Tax inspector  Signature(s) Signed: 02/17/2020 5:47:23 PM By: Baruch Gouty RN, BSN Entered By: Baruch Gouty on 02/17/2020 16:24:59 -------------------------------------------------------------------------------- Wound Assessment Details Patient Name: Date of Service: [name] 02/17/2020 3:45 PM Medical Record Number: [id number] Patient Account Number: [id number] Date of Birth/Sex: Treating RN: 1946/03/20 (73 y.o. [name] Primary Care Nazarene Bunning: Other Clinician: [name] Referring Kaleeah Gingerich: Treating Chessie Neuharth/Extender: [name] in Treatment: 5 Wound Status Wound Number: 8 Primary Lymphedema Etiology: Wound Location: Left, Proximal Lower Leg Wound Open Wounding Event: Gradually Appeared Status: Date Acquired: 02/12/2020 Comorbid Lymphedema, Sleep Apnea, Congestive Heart Failure, Weeks Of Treatment: 0 History: Hypertension, Peripheral Venous Disease, Received Chemotherapy Clustered Wound: No Photos Photo Uploaded By: Mikeal Hawthorne on 02/18/2020 13:37:49 Wound Measurements Length:  (cm) 0.6 Width: (cm) 0.8 Depth: (cm) 0.1 Area: (cm) 0.377 Volume: (cm) 0.038 % Reduction in Area: 20% % Reduction in Volume: 19.1% Epithelialization: Small (1-33%) Tunneling: No Undermining: No Wound Description Classification: Full Thickness Without Exposed Support Struct Wound Margin: Flat and Intact Exudate Amount: Small Exudate Type: Serous Exudate Color: amber ures Foul Odor After Cleansing: No Slough/Fibrino No Wound Bed Granulation Amount: Large (67-100%) Exposed Structure Granulation Quality: Red Fascia Exposed: No Necrotic Amount: None Present (0%) Fat Layer (Subcutaneous Tissue) Exposed: Yes Tendon Exposed: No Muscle Exposed: No Joint Exposed: No Bone Exposed: No Treatment Notes Wound #8 (Left, Proximal Lower Leg) 1. Cleanse With Wound Cleanser Soap and water 2. Periwound Care Moisturizing lotion TCA Cream 3. Primary Dressing Applied Calcium Alginate Ag 4. Secondary Dressing ABD Pad 6. Support Layer Applied 4 layer compression wrap Notes unna boot to anchor, Environmental health practitioner) Signed: 02/17/2020 5:47:23 PM By: Baruch Gouty RN, BSN Entered By: Baruch Gouty on 02/17/2020 16:25:33 -------------------------------------------------------------------------------- Wound Assessment Details Patient Name: Date of Service: [name] 02/17/2020 3:45 PM Medical Record Number: [id number] Patient Account Number: [id number] Date of Birth/Sex: Treating RN: 02-06-1946 (74 y.o. [name] Primary Care Amadou Katzenstein: [name] Other Clinician: Referring Roberta Angell: Treating Hamed Debella/Extender: [name] in Treatment: 5 Wound Status Wound Number: 9 Primary Lymphedema Etiology: Wound Location: Right, Anterior Lower Leg Wound Open Wounding Event: Gradually Appeared Status: Date Acquired: 02/12/2020 Comorbid Lymphedema, Sleep Apnea, Congestive Heart Failure, Weeks Of Treatment: 0 History:  Hypertension, Peripheral Venous Disease, Received Chemotherapy Clustered Wound: No Photos Photo Uploaded By: Mikeal Hawthorne on 02/18/2020 13:38:09 Wound Measurements Length: (cm) 2.7 Width: (cm) 0.6 Depth: (cm) 0.1 Area: (cm) 1.272 Volume: (cm) 0.127 % Reduction in Area: 94.4% % Reduction in Volume: 94.4% Epithelialization: Large (67-100%) Tunneling: No Undermining: No Wound Description Classification: Full Thickness Without Exposed Support Structures Wound Margin: Indistinct, nonvisible Exudate Amount: Medium Exudate Type: Serous Exudate Color: amber Foul Odor After Cleansing: No Slough/Fibrino Yes Wound Bed Granulation Amount: Large (67-100%) Exposed Structure Granulation Quality: Red Fascia Exposed: No Necrotic Amount: None Present (0%) Fat Layer (Subcutaneous Tissue) Exposed: Yes Tendon Exposed: No Muscle Exposed: No Joint Exposed: No Bone Exposed: No Treatment Notes Wound #9 (Right, Anterior Lower Leg) 1. Cleanse With Wound Cleanser Soap and water 2. Periwound Care Moisturizing lotion TCA Cream 3. Primary Dressing Applied Calcium Alginate Ag 4. Secondary Dressing ABD Pad 6. Support Layer Applied 4 layer compression wrap Notes unna boot to anchor, Environmental health practitioner) Signed: 02/17/2020 5:47:23 PM By: Baruch Gouty RN, BSN Entered By: Baruch Gouty on 02/17/2020 16:25:56 -------------------------------------------------------------------------------- Vitals Details Patient Name: Date of Service: [name] 02/17/2020 3:45 PM Medical Record Number: [id number] Patient  Account Number: [id number] Date of Birth/Sex: Treating RN: [birth year] (74 y.o. [name] Primary Care Mylani Gentry: [name] Other Clinician: Referring Izella Ybanez: Treating Mairlyn Tegtmeyer/Extender: [name] in Treatment: 5 Vital Signs Time Taken: 16:00 Temperature (F): 98 Height (in): 67 Pulse (bpm): 80 Source:  Stated Respiratory Rate (breaths/min): 18 Weight (lbs): 250 Blood Pressure (mmHg): 149/90 Source: Stated Reference Range: 80 - 120 mg / dl Body Mass Index (BMI): 39.2 Electronic Signature(s) Signed: 02/17/2020 5:47:23 PM By: Baruch Gouty RN, BSN Entered By: Baruch Gouty on 02/17/2020 16:16:44

## 2020-02-23 ENCOUNTER — Telehealth: Payer: Self-pay

## 2020-02-23 NOTE — Telephone Encounter (Signed)
Standard written order signed and faxed back for patient to get power wheelchair.   Through Andria Rhein at [facility]  Phone: [phone] Fax: [phone]

## 2020-02-25 ENCOUNTER — Encounter (HOSPITAL_BASED_OUTPATIENT_CLINIC_OR_DEPARTMENT_OTHER): Payer: PPO | Attending: Physician Assistant | Admitting: Physician Assistant

## 2020-02-25 DIAGNOSIS — E785 Hyperlipidemia, unspecified: Secondary | ICD-10-CM | POA: Insufficient documentation

## 2020-02-25 DIAGNOSIS — L97821 Non-pressure chronic ulcer of other part of left lower leg limited to breakdown of skin: Secondary | ICD-10-CM | POA: Diagnosis not present

## 2020-02-25 DIAGNOSIS — N400 Enlarged prostate without lower urinary tract symptoms: Secondary | ICD-10-CM | POA: Diagnosis not present

## 2020-02-25 DIAGNOSIS — I5032 Chronic diastolic (congestive) heart failure: Secondary | ICD-10-CM | POA: Insufficient documentation

## 2020-02-25 DIAGNOSIS — Z85820 Personal history of malignant melanoma of skin: Secondary | ICD-10-CM | POA: Insufficient documentation

## 2020-02-25 DIAGNOSIS — L97811 Non-pressure chronic ulcer of other part of right lower leg limited to breakdown of skin: Secondary | ICD-10-CM | POA: Diagnosis not present

## 2020-02-25 DIAGNOSIS — G4733 Obstructive sleep apnea (adult) (pediatric): Secondary | ICD-10-CM | POA: Insufficient documentation

## 2020-02-25 DIAGNOSIS — I890 Lymphedema, not elsewhere classified: Secondary | ICD-10-CM | POA: Diagnosis not present

## 2020-02-25 DIAGNOSIS — G20 Parkinson's disease: Secondary | ICD-10-CM | POA: Diagnosis not present

## 2020-02-25 DIAGNOSIS — I110 Hypertensive heart disease with heart failure: Secondary | ICD-10-CM | POA: Insufficient documentation

## 2020-02-25 DIAGNOSIS — Z89422 Acquired absence of other left toe(s): Secondary | ICD-10-CM | POA: Insufficient documentation

## 2020-02-25 NOTE — Progress Notes (Signed)
Nicholas [name] ([id number]) , Visit Report for 02/25/2020 SuperBill Details Patient Name: Date of Service: Nicholas [name] DFO RD K. 02/25/2020 Medical Record Number: [id number] Patient Account Number: [id number] Date of Birth/Sex: Treating RN: 1946-03-22 (74 y.o. 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 Electronic Signature(s) Signed: 02/25/2020 4:37:37 PM By: Kela Millin Signed: 02/25/2020 5:10:43 PM By: Worthy Keeler PA-C Entered By: Kela Millin on 02/25/2020 16:35:51

## 2020-02-25 NOTE — Progress Notes (Signed)
Nicholas [name] ([id number]) , Visit Report for 02/25/2020 Arrival Information Details Patient Name: Date of Service: Nicholas [name] DFO RD K. 02/25/2020 3:15 PM Medical Record Number: [id number] Patient Account Number: [id number] Date of Birth/Sex: Treating RN: [birth year] (73 y.o. Nicholas [name]) Carlene Coria Primary Care Camila Maita: [name] Other Clinician: Referring Carren Blakley: Treating Chun Sellen/Extender: Sandria Bales in Treatment: 6 Visit Information History Since Last Visit All ordered tests and consults were completed: No Patient Arrived: Wheel Chair Added or deleted any medications: No Arrival Time: 15:33 Any new allergies or adverse reactions: No Accompanied By: wife Had a fall or experienced change in No Transfer Assistance: None activities of daily living that may affect Patient Identification Verified: Yes risk of falls: Secondary Verification Process Completed: Yes Signs or symptoms of abuse/neglect since last visito No Patient Requires Transmission-Based Precautions: No Hospitalized since last visit: No Patient Has Alerts: Yes Implantable device outside of the clinic excluding No Patient Alerts: ABI: [place] 07/2018 cellular tissue based products placed in the center since last visit: Has Dressing in Place as Prescribed: Yes Has Compression in Place as Prescribed: Yes Pain Present Now: No Electronic Signature(s) Signed: 02/25/2020 4:35:51 PM By: Carlene Coria RN Entered By: Carlene Coria on 02/25/2020 15:36:46 -------------------------------------------------------------------------------- Compression Therapy Details Patient Name: Date of Service: Nicholas [name] DFO RD K. 02/25/2020 3:15 PM Medical Record Number: [id number] Patient Account Number: [id number] Date of Birth/Sex: Treating RN: 10/05/1945 (74 y.o. [name] Primary Care Tyreesha Maharaj: [name] Other Clinician: Referring Tabytha Gradillas: Treating Faten Frieson/Extender: Sandria Bales in Treatment: 6 Compression Therapy Performed for Wound Assessment: Wound #10 Right,Lateral Lower Leg Performed By: Clinician Kela Millin, RN Compression Type: Four Layer Electronic Signature(s) Signed: 02/25/2020 4:37:37 PM By: Kela Millin Entered By: Kela Millin on 02/25/2020 16:34:30 -------------------------------------------------------------------------------- Compression Therapy Details Patient Name: Date of Service: Nicholas [name] DFO RD K. 02/25/2020 3:15 PM Medical Record Number: [id number] Patient Account Number: [id number] Date of Birth/Sex: Treating RN: Jul 31, 1945 (74 y.o. [name] Primary Care Jozalyn Baglio: [name] Other Clinician: Referring Temperance Kelemen: Treating Shenicka Sunderlin/Extender: Sandria Bales in Treatment: 6 Compression Therapy Performed for Wound Assessment: Wound #8 Left,Proximal Lower Leg Performed By: Clinician Kela Millin, RN Compression Type: Four Layer Electronic Signature(s) Signed: 02/25/2020 4:37:37 PM By: Kela Millin Entered By: Kela Millin on 02/25/2020 16:34:30 -------------------------------------------------------------------------------- Compression Therapy Details Patient Name: Date of Service: Nicholas [name] DFO RD K. 02/25/2020 3:15 PM Medical Record Number: [id number] Patient Account Number: [id number] Date of Birth/Sex: Treating RN: 12-Sep-1945 (74 y.o. [name] Primary Care Jhon Mallozzi: [name] Other Clinician: Referring Shelby Anderle: Treating Yazeed Pryer/Extender: Sandria Bales in Treatment: 6 Compression Therapy Performed for Wound Assessment: Wound #9 Right,Anterior Lower Leg Performed By: Clinician Kela Millin, RN Compression Type: Four Layer Electronic Signature(s) Signed: 02/25/2020 4:37:37 PM By: Kela Millin Entered By: Kela Millin on 02/25/2020  16:34:33 -------------------------------------------------------------------------------- Encounter Discharge Information Details Patient Name: Date of Service: Nicholas [name] DFO RD K. 02/25/2020 3:15 PM Medical Record Number: [id number] Patient Account Number: [id number] Date of Birth/Sex: Treating RN: [birth year] (74 y.o. [name] Primary Care Abdias Hickam: [name] Other Clinician: Referring Shara Hartis: Treating Eeva Schlosser/Extender: Sandria Bales in Treatment: 6 Encounter Discharge Information Items Discharge Condition: Stable Ambulatory Status: Wheelchair Discharge Destination: Home Transportation: Private Auto Accompanied By: wife Schedule Follow-up Appointment: Yes Clinical Summary of Care: Patient Declined Electronic Signature(s) Signed: 02/25/2020 4:37:37 PM By: Kela Millin Entered  By: Kela Millin on 02/25/2020 16:35:33 -------------------------------------------------------------------------------- Patient/Caregiver Education Details Patient Name: Date of Service: Nicholas [name] DFO RD K. 8/4/2021andnbsp3:15 PM Medical Record Number: [id number] Patient Account Number: [id number] Date of Birth/Gender: Treating RN: Oct 08, 1945 (74 y.o. [name] Primary Care Physician: [name] Other Clinician: Referring Physician: Treating Physician/Extender: Sandria Bales in Treatment: 6 Education Assessment Education Provided To: Patient Education Topics Provided Wound/Skin Impairment: Handouts: Caring for Your Ulcer Methods: Explain/Verbal Responses: State content correctly Electronic Signature(s) Signed: 02/25/2020 4:37:37 PM By: Kela Millin Entered By: Kela Millin on 02/25/2020 16:35:18 -------------------------------------------------------------------------------- Wound Assessment Details Patient Name: Date of Service: Nicholas [name] DFO RD K. 02/25/2020 3:15 PM Medical Record  Number: [id number] Patient Account Number: [id number] Date of Birth/Sex: Treating RN: 08-Jul-1946 (73 y.o. Nicholas [name]) Carlene Coria Primary Care Olukemi Panchal: [name] Other Clinician: Referring Roniel Halloran: Treating Adrianna Dudas/Extender: Sandria Bales in Treatment: 6 Wound Status Wound Number: 10 Primary Etiology: Lymphedema Wound Location: Right, Lateral Lower Leg Wound Status: Open Wounding Event: Gradually Appeared Date Acquired: 02/12/2020 Weeks Of Treatment: 1 Clustered Wound: No Wound Measurements Length: (cm) 2.2 Width: (cm) 2.8 Depth: (cm) 0.1 Area: (cm) 4.838 Volume: (cm) 0.484 % Reduction in Area: 29.6% % Reduction in Volume: 29.5% Wound Description Classification: Full Thickness Without Exposed Support Structur es Treatment Notes Wound #10 (Right, Lateral Lower Leg) 1. Cleanse With Wound Cleanser Soap and water 2. Periwound Care Moisturizing lotion 3. Primary Dressing Applied Calcium Alginate Ag 4. Secondary Dressing ABD Pad 6. Support Layer Applied 4 layer compression wrap Electronic Signature(s) Signed: 02/25/2020 4:35:51 PM By: Carlene Coria RN Entered By: Carlene Coria on 02/25/2020 15:42:30 -------------------------------------------------------------------------------- Wound Assessment Details Patient Name: Date of Service: Nicholas [name] DFO RD K. 02/25/2020 3:15 PM Medical Record Number: [id number] Patient Account Number: [id number] Date of Birth/Sex: Treating RN: [birth year] (73 y.o. Nicholas [name]) Carlene Coria Primary Care Page Pucciarelli: [name] Other Clinician: Referring Japneet Staggs: Treating Tarus Briski/Extender: Sandria Bales in Treatment: 6 Wound Status Wound Number: 8 Primary Etiology: Lymphedema Wound Location: Left, Proximal Lower Leg Wound Status: Open Wounding Event: Gradually Appeared Date Acquired: 02/12/2020 Weeks Of Treatment: 1 Clustered Wound: No Wound Measurements Length: (cm) 0.6 Width: (cm)  0.8 Depth: (cm) 0.1 Area: (cm) 0.377 Volume: (cm) 0.038 % Reduction in Area: 20% % Reduction in Volume: 19.1% Wound Description Classification: Full Thickness Without Exposed Support Structur es Treatment Notes Wound #8 (Left, Proximal Lower Leg) 1. Cleanse With Wound Cleanser Soap and water 2. Periwound Care Moisturizing lotion 3. Primary Dressing Applied Calcium Alginate Ag 4. Secondary Dressing ABD Pad 6. Support Layer Applied 4 layer compression wrap Electronic Signature(s) Signed: 02/25/2020 4:35:51 PM By: Carlene Coria RN Entered By: Carlene Coria on 02/25/2020 15:42:31 -------------------------------------------------------------------------------- Wound Assessment Details Patient Name: Date of Service: Nicholas [name] DFO RD K. 02/25/2020 3:15 PM Medical Record Number: [id number] Patient Account Number: [id number] Date of Birth/Sex: Treating RN: 1945-08-19 (73 y.o. Nicholas [name]) Carlene Coria Primary Care Aivy Akter: [name] Other Clinician: Referring Kyrie Fludd: Treating Trellis Guirguis/Extender: Sandria Bales in Treatment: 6 Wound Status Wound Number: 9 Primary Etiology: Lymphedema Wound Location: Right, Anterior Lower Leg Wound Status: Open Wounding Event: Gradually Appeared Date Acquired: 02/12/2020 Weeks Of Treatment: 1 Clustered Wound: No Wound Measurements Length: (cm) 2.7 Width: (cm) 0.6 Depth: (cm) 0.1 Area: (cm) 1.272 Volume: (cm) 0.127 % Reduction in Area: 94.4% % Reduction in Volume: 94.4% Wound Description Classification: Full Thickness Without Exposed Support Structur es Treatment Notes Wound #  9 (Right, Anterior Lower Leg) 1. Cleanse With Wound Cleanser Soap and water 2. Periwound Care Moisturizing lotion 3. Primary Dressing Applied Calcium Alginate Ag 4. Secondary Dressing ABD Pad 6. Support Layer Applied 4 layer compression wrap Electronic Signature(s) Signed: 02/25/2020 4:35:51 PM By: Carlene Coria RN Entered By: Carlene Coria on 02/25/2020 15:42:31 -------------------------------------------------------------------------------- Vitals Details Patient Name: Date of Service: Nicholas [name] DFO RD K. 02/25/2020 3:15 PM Medical Record Number: [id number] Patient Account Number: [id number] Date of Birth/Sex: Treating RN: 07-28-1945 (73 y.o. Nicholas [name]) Carlene Coria Primary Care Danil Wedge: [name] Other Clinician: Referring Mauriana Dann: Treating Kiasha Bellin/Extender: Sandria Bales in Treatment: 6 Vital Signs Time Taken: 15:37 Temperature (F): 97.9 Height (in): 67 Pulse (bpm): 80 Weight (lbs): 250 Respiratory Rate (breaths/min): 18 Body Mass Index (BMI): 39.2 Blood Pressure (mmHg): 145/72 Reference Range: 80 - 120 mg / dl Electronic Signature(s) Signed: 02/25/2020 4:35:51 PM By: Carlene Coria RN Entered By: Carlene Coria on 02/25/2020 15:42:10

## 2020-03-01 ENCOUNTER — Other Ambulatory Visit: Payer: Self-pay | Admitting: Neurology

## 2020-03-02 ENCOUNTER — Encounter (HOSPITAL_BASED_OUTPATIENT_CLINIC_OR_DEPARTMENT_OTHER): Payer: PPO | Admitting: Internal Medicine

## 2020-03-04 ENCOUNTER — Encounter (HOSPITAL_BASED_OUTPATIENT_CLINIC_OR_DEPARTMENT_OTHER): Payer: PPO | Admitting: Internal Medicine

## 2020-03-04 DIAGNOSIS — L97821 Non-pressure chronic ulcer of other part of left lower leg limited to breakdown of skin: Secondary | ICD-10-CM | POA: Diagnosis not present

## 2020-03-04 DIAGNOSIS — L97811 Non-pressure chronic ulcer of other part of right lower leg limited to breakdown of skin: Secondary | ICD-10-CM | POA: Diagnosis not present

## 2020-03-04 DIAGNOSIS — I890 Lymphedema, not elsewhere classified: Secondary | ICD-10-CM | POA: Diagnosis not present

## 2020-03-05 NOTE — Progress Notes (Signed)
Nicholas [name] ([id number]) , Visit Report for 03/04/2020 HPI Details Patient Name: Date of Service: Nicholas [name] DFO RD K. 03/04/2020 3:00 PM Medical Record Number: [id number] Patient Account Number: [id number] Date of Birth/Sex: Treating RN: July 19, 1946 (74 y.o. Nicholas [name] Primary Care Provider: [name] Other Clinician: Referring Provider: Treating Provider/Extender: Dossie Der in Treatment: 8 History of Present Illness HPI Description: ADMISSION 07/04/2018 This is a 74 year old man who has bilateral lymphedema felt to be secondary to previous treatment for malignant melanoma on his left toes. 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 Malignant melanoma was treated with immunotherapy. He is not had radiation. Sometime after this he developed edema in the left leg that spread into the right leg. He has bilateral compression pumps at home and he uses them twice a day and claims to be quite compliant. He also has a juxta lite type stocking at home which is apparently 74-year-old. He states that he fell in early October dislocating and fracturing his left shoulder. He slept in a recliner with his legs dependent. He was seen and followed at the rehab clinic at [facility] and was having lymphedema wraps done by 1 of the PT who noted his open wounds last week and he has been referred here for management. The patient has 2 open wounds on the right s distal lower leg and one on the left anterior lower leg The patient is not a diabetic. 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 For one reason or another we could not get him set up with home health and he kept the wraps on all week which he generally seem to tolerate. He did not use his compression pumps over the top of the wraps which I told him he could do this week. He comes in with a new rash on both feet which looks like tinea 07/22/2018; much better edema control bilaterally. The wounds on the right lateral calf and left anterior lower leg both look a lot better. There is epithelialization over both surfaces but I think this is fragile and I am going to put him in our compression wraps for another week. He says he is been using his compression pumps at home. He has a constellation of lower extremity compression garments that he is going to bring in next week. In discussion with him I cannot really get a sense of everything he has. I think he was at one point prescribed 30-40 below-knee stockings but he points out he could not get them on. 07/29/2018; the patient arrives in his legs are healed. He has a constellation of stockings none of which I think are going to be that helpful except for the fact that he has external wraparound stockings of some description. He has above-knee stockings but I cannot imagine it would be possible to get these on READMISSION 01/08/2020 Patient is now a 74 year old man. We had him for a short period from December 2019 through January 2020. He has bilateral lower extremity lymphedema which he initially traces to treatment for malignant melanoma 7 years ago. He has been followed at the lymphedema clinic on  [place] and recently has compression pumps that go up to his mid abdomen. He recently developed superficial wounds bilaterally. They will not manage these in the lymphedema clinic where the services are run by therapy. He has not been using his compression pumps. His significant other cannot get the stockings on his legs and the patient is only limited ability to help because  of Parkinson's disease Past medical history is essentially unchanged. He has sleep apnea, Parkinson's disease at some point labeled his Parkinson's plus, bilateral lymphedema, history of malignant melanoma. He does not have an arterial issue that I am aware of although his previous arterial studies have been noncompressible 7/1; patient we admitted the clinic 2 weeks ago. He has severe bilateral lymphedema. He had wounds predominantly on the left leg but also superficially on the right. He has compression pumps which he says he is using once a day. 7/15; 2-week follow-up. He comes in for nurse visit. He has severe bilateral lymphedema. He has upper abdominal level compression pumps which she is using twice a day. We have been wrapping both his lower legs the areas on the right leg are healed. He still has an area of left upper lateral and right anterior medial. Anterior medial wound is small. He is tolerating this well. 7/22; still significant lymphedema even with 4-layer compression and external compression pump usage. He said he had some itching on the right anterior leg he comes in with a large superficial area of skin breakdown anteriorly and a smaller area laterally on the right leg. He also has a rash on the left dorsal ankle crease. Some of these look pustular. I wonder if this is an occlusion issue from foam we put down here. He also states the only thing different this week is the did a 5-hour car ride with his legs dependent. There was nothing open on the right leg last week but we wrapped it in any case 7/27; culture of the blisters on his left anterior leg grew staph aureus. I gave him doxycycline which he started yesterday. He is already appear cleaner and dry. His major wound is on the right anterior lower leg and the right lateral lower leg. There is nothing else open on the left leg. He has better edema control today. 03/04/20-Patient comes in with new areas after he had a fall, on his  left dorsal foot and right dorsal foot and left medial proximal leg, there is significant amount of drainage into his dressing that was removed today which is actually greenish in color. It looks like he is completed a course of doxycycline for staph that grew from his blisters however he might need antipseudomonal coverage at this point Electronic Signature(s) Signed: 03/04/2020 3:53:53 PM By: Tobi Bastos MD, MBA Entered By: Tobi Bastos on 03/04/2020 15:53:52 -------------------------------------------------------------------------------- Physical Exam Details Patient Name: Date of Service: Nicholas [name] DFO RD K. 03/04/2020 3:00 PM Medical Record Number: [id number] Patient Account Number: [id number] Date of Birth/Sex: Treating RN: 12-23-1945 (74 y.o. Nicholas [name] Primary Care Provider: [name] Other Clinician: Referring Provider: Treating Provider/Extender: Dossie Der in Treatment: 8 Constitutional alert and oriented x 3. sitting or standing blood pressure is within target range for patient.. supine blood pressure is within target range for patient.. pulse regular and within target range for patient.Marland Kitchen respirations regular, non-labored and within target range for patient.Marland Kitchen temperature within target range for patient.. . . Well- nourished and well-hydrated in no acute distress. Notes Dorsal left  foot and right foot surface abrasion wounds, skin depth wound in the left medial upper leg Electronic Signature(s) Signed: 03/04/2020 3:54:25 PM By: Tobi Bastos MD, MBA Entered By: Tobi Bastos on 03/04/2020 15:54:25 -------------------------------------------------------------------------------- Physician Orders Details Patient Name: Date of Service: Nicholas [name] DFO RD K. 03/04/2020 3:00 PM Medical Record Number: [id number] Patient Account Number: [id number] Date of Birth/Sex: Treating RN: 03-16-1946 (74 y.o. Nicholas [name] Primary Care  Provider: [name] Other Clinician: Referring Provider: Treating Provider/Extender: Dossie Der in Treatment: 8 Verbal / Phone Orders: No Diagnosis Coding ICD-10 Coding Code Description I89.0 Lymphedema, not elsewhere classified L97.811 Non-pressure chronic ulcer of other part of right lower leg limited to breakdown of skin L97.821 Non-pressure chronic ulcer of other part of left lower leg limited to breakdown of skin Follow-up Appointments Return Appointment in 1 week. Dressing Change Frequency Do not change entire dressing for one week. - all wounds Skin Barriers/Peri-Wound Care Moisturizing lotion TCA Cream or Ointment - mixed with lotion to both legs liberally Wound Cleansing May shower with protection. - with cast protectors. Primary Wound Dressing Wound #11 Left,Medial Lower Leg lginate with Silver - Gentamicin under alginate Calcium A Wound #12 Left,Dorsal Foot lginate with Silver - Gentamicin under alginate Calcium A Wound #13 Right Foot lginate with Silver - Gentamicin under alginate Calcium A Secondary Dressing Wound #11 Left,Medial Lower Leg ABD pad Zetuvit or Kerramax Wound #12 Left,Dorsal Foot ABD pad Zetuvit or Kerramax Wound #13 Right Foot ABD pad Zetuvit or Kerramax Edema Control 4 layer compression - Bilateral - unna boot at the foot and top of leg to anchor Avoid standing for long periods of time Elevate legs to the level of the heart or above for 30 minutes daily and/or when sitting, a frequency of: - throughout the day. Exercise regularly Segmental Compressive Device. - use lymphedema pumps twice a day an hour each time. Once in the morning and once in the evening. Electronic Signature(s) Signed: 03/04/2020 5:03:09 PM By: Tobi Bastos MD, MBA Signed: 03/05/2020 5:19:54 PM By: Levan Hurst RN, BSN Entered By: Levan Hurst on 03/04/2020  15:54:47 -------------------------------------------------------------------------------- Problem List Details Patient Name: Date of Service: Nicholas [name] DFO RD K. 03/04/2020 3:00 PM Medical Record Number: [id number] Patient Account Number: [id number] Date of Birth/Sex: Treating RN: 21-Sep-1945 (74 y.o. Nicholas [name] Primary Care Provider: [name] Other Clinician: Referring Provider: Treating Provider/Extender: Dossie Der in Treatment: 8 Active Problems ICD-10 Encounter Code Description Active Date MDM Diagnosis I89.0 Lymphedema, not elsewhere classified 01/08/2020 No Yes L97.811 Non-pressure chronic ulcer of other part of right lower leg limited to breakdown 01/08/2020 No Yes of skin L97.821 Non-pressure chronic ulcer of other part of left lower leg limited to breakdown 01/08/2020 No Yes of skin Inactive Problems Resolved Problems Electronic Signature(s) Signed: 03/04/2020 5:03:09 PM By: Tobi Bastos MD, MBA Signed: 03/05/2020 5:19:54 PM By: Levan Hurst RN, BSN Entered By: Levan Hurst on 03/04/2020 15:50:41 -------------------------------------------------------------------------------- Progress Note Details Patient Name: Date of Service: Nicholas [name] DFO RD K. 03/04/2020 3:00 PM Medical Record Number: [id number] Patient Account Number: [id number] Date of Birth/Sex: Treating RN: 12-Nov-1945 (74 y.o. Nicholas [name] Primary Care Provider: [name] Other Clinician: Referring Provider: Treating Provider/Extender: Dossie Der in Treatment: 8 Subjective History of Present Illness (HPI) ADMISSION 07/04/2018 This is a 74 year old man who has bilateral lymphedema felt to be secondary to previous treatment for malignant melanoma on his left toes. He has since had  amputations of the second and third toe of the left foot. Malignant melanoma was treated with immunotherapy. He is not had radiation. Sometime  after this he developed edema in the left leg that spread into the right leg. He has bilateral compression pumps at home and he uses them twice a day and claims to be quite compliant. He also has a juxta lite type stocking at home which is apparently 20-year-old. He states that he fell in early October dislocating and fracturing his left shoulder. He slept in a recliner with his legs dependent. He was seen and followed at the rehab clinic at [facility] and was having lymphedema wraps done by 1 of the PT who noted his open wounds last week and he has been referred here for management. The patient has 2 open wounds on the right s distal lower leg and one on the left anterior lower leg The patient is not a diabetic. 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 For one reason or another we could not get him set up with home health and he kept the wraps on all week which he generally seem to tolerate. He did not use his compression pumps over the top of the wraps which I told him he could do this week. He comes in with a new rash on both feet which looks like tinea 07/22/2018; much better edema control bilaterally. The wounds on the right lateral calf and left anterior lower leg both look a lot better. There is epithelialization over both surfaces but I think this is fragile and I am going to put him in our compression wraps for another week. He says he is been using his compression pumps at home. ooHe has a constellation of lower extremity compression garments that he is going to bring in next week. In discussion with him I cannot really get a sense  of everything he has. I think he was at one point prescribed 30-40 below-knee stockings but he points out he could not get them on. 07/29/2018; the patient arrives in his legs are healed. He has a constellation of stockings none of which I think are going to be that helpful except for the fact that he has external wraparound stockings of some description. He has above-knee stockings but I cannot imagine it would be possible to get these on READMISSION 01/08/2020 Patient is now a 74 year old man. We had him for a short period from December 2019 through January 2020. He has bilateral lower extremity lymphedema which he initially traces to treatment for malignant melanoma 7 years ago. He has been followed at the lymphedema clinic on [facility] and recently has compression pumps that go up to his mid abdomen. He recently developed superficial wounds bilaterally. They will not manage these in the lymphedema clinic where the services are run by therapy. He has not been using his compression pumps. His significant other cannot get the stockings on his legs and the patient is only limited ability to help because of Parkinson's disease Past medical history is essentially unchanged. He has sleep apnea, Parkinson's disease at some point labeled his Parkinson's plus, bilateral lymphedema, history of malignant melanoma. He does not have an arterial  issue that I am aware of although his previous arterial studies have been noncompressible 7/1; patient we admitted the clinic 2 weeks ago. He has severe bilateral lymphedema. He had wounds predominantly on the left leg but also superficially on the right. He has compression pumps which he says he is using once a day. 7/15; 2-week follow-up. He comes in for nurse visit. He has severe bilateral lymphedema. He has upper abdominal level compression pumps which she is using twice a day. We have been wrapping both his lower legs the areas on the right leg are healed. He  still has an area of left upper lateral and right anterior medial. Anterior medial wound is small. He is tolerating this well. 7/22; still significant lymphedema even with 4-layer compression and external compression pump usage. He said he had some itching on the right anterior leg he comes in with a large superficial area of skin breakdown anteriorly and a smaller area laterally on the right leg. He also has a rash on the left dorsal ankle crease. Some of these look pustular. I wonder if this is an occlusion issue from foam we put down here. He also states the only thing different this week is the did a 5-hour car ride with his legs dependent. There was nothing open on the right leg last week but we wrapped it in any case 7/27; culture of the blisters on his left anterior leg grew staph aureus. I gave him doxycycline which he started yesterday. He is already appear cleaner and dry. His major wound is on the right anterior lower leg and the right lateral lower leg. There is nothing else open on the left leg. He has better edema control today. 03/04/20-Patient comes in with new areas after he had a fall, on his left dorsal foot and right dorsal foot and left medial proximal leg, there is significant amount of drainage into his dressing that was removed today which is actually greenish in color. It looks like he is completed a course of doxycycline for staph that grew from his blisters however he might need antipseudomonal coverage at this point Objective Constitutional alert and oriented x 3. sitting or standing blood pressure is within target range for patient.. supine blood pressure is within target range for patient.. pulse regular and within target range for patient.Marland Kitchen respirations regular, non-labored and within target range for patient.Marland Kitchen temperature within target range for patient.. Well- nourished and well-hydrated in no acute distress. Vitals Time Taken: 3:14 PM, Height: 67 in, Weight: 250 lbs,  BMI: 39.2, Temperature: 97.8 F, Pulse: 90 bpm, Respiratory Rate: 18 breaths/min, Blood Pressure: 132/77 mmHg. General Notes: Dorsal left foot and right foot surface abrasion wounds, skin depth wound in the left medial upper leg Integumentary (Hair, Skin) Wound #10 status is Open. Original cause of wound was Gradually Appeared. The wound is located on the Right,Lateral Lower Leg. The wound measures 0cm length x 0cm width x 0cm depth; 0cm^2 area and 0cm^3 volume. There is no tunneling or undermining noted. There is a none present amount of drainage noted. There is no granulation within the wound bed. There is no necrotic tissue within the wound bed. Wound #11 status is Open. Original cause of wound was Blister. The wound is located on the Left,Medial Lower Leg. The wound measures 1.2cm length x 1.7cm width x 0.1cm depth; 1.602cm^2 area and 0.16cm^3 volume. There is Fat Layer (Subcutaneous Tissue) Exposed exposed. There is no tunneling or undermining noted. There is a medium amount of  serosanguineous drainage noted. There is medium (34-66%) pink granulation within the wound bed. There is a medium (34- 66%) amount of necrotic tissue within the wound bed including Adherent Slough. Wound #12 status is Open. Original cause of wound was Blister. The wound is located on the Left,Dorsal Foot. The wound measures 1.2cm length x 1.5cm width x 0.1cm depth; 1.414cm^2 area and 0.141cm^3 volume. There is Fat Layer (Subcutaneous Tissue) Exposed exposed. There is no tunneling or undermining noted. There is a medium amount of serosanguineous drainage noted. There is medium (34-66%) pink granulation within the wound bed. There is a medium (34- 66%) amount of necrotic tissue within the wound bed including Adherent Slough. Wound #13 status is Open. Original cause of wound was Blister. The wound is located on the Right Foot. The wound measures 3cm length x 7.5cm width x 0.1cm depth; 17.671cm^2 area and 1.767cm^3 volume.  There is Fat Layer (Subcutaneous Tissue) Exposed exposed. There is no tunneling or undermining noted. There is a medium amount of serosanguineous drainage noted. There is medium (34-66%) pink granulation within the wound bed. There is a medium (34- 66%) amount of necrotic tissue within the wound bed including Adherent Slough. Wound #8 status is Open. Original cause of wound was Gradually Appeared. The wound is located on the Left,Proximal Lower Leg. The wound measures 0cm length x 0cm width x 0cm depth; 0cm^2 area and 0cm^3 volume. There is no tunneling or undermining noted. There is a none present amount of drainage noted. There is no granulation within the wound bed. There is no necrotic tissue within the wound bed. Wound #9 status is Open. Original cause of wound was Gradually Appeared. The wound is located on the Right,Anterior Lower Leg. The wound measures 0cm length x 0cm width x 0cm depth; 0cm^2 area and 0cm^3 volume. There is no tunneling or undermining noted. There is a none present amount of drainage noted. There is no granulation within the wound bed. There is no necrotic tissue within the wound bed. 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 Dressing Change Frequency: Do not change entire dressing for one week. - all wounds Skin Barriers/Peri-Wound Care: Moisturizing lotion TCA Cream or Ointment - mixed with lotion to both legs liberally Wound Cleansing: May shower with protection. - with cast protectors. Primary Wound Dressing: Wound #11 Left,Medial Lower Leg: Calcium Alginate with Silver - Gentamicin under alginate Wound #12 Left,Dorsal Foot: Calcium Alginate with Silver - Gentamicin under alginate Wound #13 Right  Foot: Calcium Alginate with Silver - Gentamicin under alginate Secondary Dressing: Wound #11 Left,Medial Lower Leg: ABD pad Zetuvit or Kerramax Wound #12 Left,Dorsal Foot: ABD pad Zetuvit or Kerramax Wound #13 Right Foot: ABD pad Zetuvit or Kerramax Edema Control: 4 layer compression - Bilateral Avoid standing for long periods of time Elevate legs to the level of the heart or above for 30 minutes daily and/or when sitting, a frequency of: - throughout the day. Exercise regularly Segmental Compressive Device. - use lymphedema pumps twice a day an hour each time. Once in the morning and once in the evening. -We will continue silver alginate to the wounds especially the new ones, could use gentamicin on wound surfaces -4 layer compression -TCA to periwound -Return to clinic next week Electronic Signature(s) Signed: 03/04/2020 3:55:14 PM By:  Tobi Bastos MD, MBA Entered By: Tobi Bastos on 03/04/2020 15:55:14 -------------------------------------------------------------------------------- SuperBill Details Patient Name: Date of Service: Nicholas [name] DFO RD K. 03/04/2020 Medical Record Number: [id number] Patient Account Number: [id number] Date of Birth/Sex: Treating RN: June 13, 1946 (74 y.o. Nicholas [name] Primary Care Provider: [name] Other Clinician: Referring Provider: Treating Provider/Extender: Dossie Der in Treatment: 8 Diagnosis Coding ICD-10 Codes Code Description I89.0 Lymphedema, not elsewhere classified L97.811 Non-pressure chronic ulcer of other part of right lower leg limited to breakdown of skin L97.821 Non-pressure chronic ulcer of other part of left lower leg limited to breakdown of skin Facility Procedures CPT4: Code [id number] Description: 81 BILATERAL: Application of multi-layer venous compression system; leg (below knee), including ankle and t. Modifier: Quantity: 1 Physician Procedures : CPT4 Code  Description Modifier [id number] - WC PHYS LEVEL 3 - EST PT ICD-10 Diagnosis Description L97.811 Non-pressure chronic ulcer of other part of right lower leg limited to breakdown of skin Quantity: 1 Electronic Signature(s) Signed: 03/05/2020 9:39:52 AM By: Tobi Bastos MD, MBA Signed: 03/05/2020 5:19:54 PM By: Levan Hurst RN, BSN Previous Signature: 03/04/2020 3:55:33 PM Version By: Tobi Bastos MD, MBA Entered By: Levan Hurst on 03/04/2020 17:09:36

## 2020-03-05 NOTE — Progress Notes (Signed)
Nicholas [name] ([id number]) , Visit Report for 03/04/2020 Arrival Information Details Patient Name: Date of Service: Nicholas Junes DFO RD K. 03/04/2020 3:00 PM Medical Record Number: [id number] Patient Account Number: [id number] Date of Birth/Sex: Treating RN: 11-May-1946 (73 y.o. Nicholas [name]) Carlene Coria Primary Care Shreyan Hinz: [name] Other Clinician: Referring Lirio Bach: Treating Xochilth Standish/Extender: Dossie Der in Treatment: 8 Visit Information History Since Last Visit All ordered tests and consults were completed: No Patient Arrived: Wheel Chair Added or deleted any medications: No Arrival Time: 15:13 Any new allergies or adverse reactions: No Accompanied By: caregiver Had a fall or experienced change in No Transfer Assistance: None activities of daily living that may affect Patient Identification Verified: Yes risk of falls: Secondary Verification Process Completed: Yes Signs or symptoms of abuse/neglect since last visito No Patient Requires Transmission-Based Precautions: No Hospitalized since last visit: No Patient Has Alerts: Yes Implantable device outside of the clinic excluding No Patient Alerts: ABI: [place] 07/2018 cellular tissue based products placed in the center since last visit: Has Dressing in Place as Prescribed: Yes Has Compression in Place as Prescribed: Yes Pain Present Now: No Electronic Signature(s) Signed: 03/04/2020 5:07:35 PM By: Carlene Coria RN Entered By: Carlene Coria on 03/04/2020 15:14:29 -------------------------------------------------------------------------------- Compression Therapy Details Patient Name: Date of Service: Nicholas Junes DFO RD K. 03/04/2020 3:00 PM Medical Record Number: [id number] Patient Account Number: [id number] Date of Birth/Sex: Treating RN: [birth year] (74 y.o. Nicholas [name] Primary Care Clayvon Parlett: [name] Other Clinician: Referring Caedin Mogan: Treating Jentry Mcqueary/Extender: Dossie Der in Treatment: 8 Compression Therapy Performed for Wound Assessment: Wound #11 Left,Medial Lower Leg Performed By: Clinician Levan Hurst, RN Compression Type: Four Layer Post Procedure Diagnosis Same as Pre-procedure Electronic Signature(s) Signed: 03/05/2020 5:19:54 PM By: Levan Hurst RN, BSN Entered By: Levan Hurst on 03/04/2020 15:55:21 -------------------------------------------------------------------------------- Compression Therapy Details Patient Name: Date of Service: Nicholas Junes DFO RD K. 03/04/2020 3:00 PM Medical Record Number: [id number] Patient Account Number: [id number] Date of Birth/Sex: Treating RN: Mar 25, 1946 (74 y.o. Nicholas [name] Primary Care Hever Castilleja: [name] Other Clinician: Referring Baylynn Shifflett: Treating Nicholas [name]/Extender: Dossie Der in Treatment: 8 Compression Therapy Performed for Wound Assessment: Wound #12 Left,Dorsal Foot Performed By: Clinician Levan Hurst, RN Compression Type: Four Layer Post Procedure Diagnosis Same as Pre-procedure Electronic Signature(s) Signed: 03/05/2020 5:19:54 PM By: Levan Hurst RN, BSN Entered By: Levan Hurst on 03/04/2020 15:55:21 -------------------------------------------------------------------------------- Compression Therapy Details Patient Name: Date of Service: Nicholas Junes DFO RD K. 03/04/2020 3:00 PM Medical Record Number: [id number] Patient Account Number: [id number] Date of Birth/Sex: Treating RN: 06-10-1946 (74 y.o. Nicholas [name] Primary Care Tymika Grilli: [name] Other Clinician: Referring Payal Stanforth: Treating Khadim Lundberg/Extender: Dossie Der in Treatment: 8 Compression Therapy Performed for Wound Assessment: Wound #13 Right Foot Performed By: Clinician Levan Hurst, RN Compression Type: Four Layer Post Procedure Diagnosis Same as Pre-procedure Electronic Signature(s) Signed: 03/05/2020 5:19:54 PM By:  Levan Hurst RN, BSN Entered By: Levan Hurst on 03/04/2020 15:55:21 -------------------------------------------------------------------------------- Encounter Discharge Information Details Patient Name: Date of Service: Nicholas Junes DFO RD K. 03/04/2020 3:00 PM Medical Record Number: [id number] Patient Account Number: [id number] Date of Birth/Sex: Treating RN: 1946/07/18 (73 y.o. Nicholas [name] Primary Care Valencia Kassa: [name] Other Clinician: Referring Carreen Milius: Treating Rashi Giuliani/Extender: Dossie Der in Treatment: 8 Encounter Discharge Information Items Discharge Condition: Stable Ambulatory Status: Wheelchair Discharge Destination: Home Transportation: Private Auto Accompanied By: self Schedule Follow-up Appointment:  Yes Clinical Summary of Care: Patient Declined Electronic Signature(s) Signed: 03/04/2020 5:07:35 PM By: Carlene Coria RN Entered By: Carlene Coria on 03/04/2020 16:20:44 -------------------------------------------------------------------------------- Lower Extremity Assessment Details Patient Name: Date of Service: Nicholas Junes DFO RD K. 03/04/2020 3:00 PM Medical Record Number: [id number] Patient Account Number: [id number] Date of Birth/Sex: Treating RN: 06-10-1946 (73 y.o. Nicholas [name]) Carlene Coria Primary Care Draken Farrior: [name] Other Clinician: Referring Maiyah Goyne: Treating Nicholas [name]/Extender: Dossie Der in Treatment: 8 Edema Assessment Assessed: Nicholas [name]: No] Nicholas [name]: No] Edema: [Left: Yes] [Right: Yes] Calf Left: Right: Point of Measurement: 40 cm From Medial Instep 39 cm 42 cm Ankle Left: Right: Point of Measurement: 19 cm From Medial Instep 33 cm 36 cm Electronic Signature(s) Signed: 03/04/2020 5:07:35 PM By: Carlene Coria RN Entered By: Carlene Coria on 03/04/2020 15:22:26 -------------------------------------------------------------------------------- Multi-Disciplinary Care Plan  Details Patient Name: Date of Service: Nicholas Junes DFO RD K. 03/04/2020 3:00 PM Medical Record Number: [id number] Patient Account Number: [id number] Date of Birth/Sex: Treating RN: [birth year] (74 y.o. Nicholas [name] Primary Care Stepen Prins: [name] Other Clinician: Referring Lexxi Koslow: Treating Evany Schecter/Extender: Dossie Der in Treatment: 8 Active Inactive Venous Leg Ulcer Nursing Diagnoses: Potential for venous Insuffiency (use before diagnosis confirmed) Goals: Patient will maintain optimal edema control Date Initiated: 01/08/2020 Target Resolution Date: 04/02/2020 Goal Status: Active Patient/caregiver will verbalize understanding of disease process and disease management Date Initiated: 01/08/2020 Date Inactivated: 02/05/2020 Target Resolution Date: 01/30/2020 Goal Status: Met Interventions: Assess peripheral edema status every visit. Notes: Wound/Skin Impairment Nursing Diagnoses: Knowledge deficit related to ulceration/compromised skin integrity Goals: Patient/caregiver will verbalize understanding of skin care regimen Date Initiated: 01/08/2020 Target Resolution Date: 04/02/2020 Goal Status: Active Ulcer/skin breakdown will have a volume reduction of 30% by week 4 Date Initiated: 01/08/2020 Date Inactivated: 02/05/2020 Target Resolution Date: 01/30/2020 Goal Status: Met Interventions: Assess patient/caregiver ability to obtain necessary supplies Assess patient/caregiver ability to perform ulcer/skin care regimen upon admission and as needed Provide education on ulcer and skin care Treatment Activities: Skin care regimen initiated : 01/08/2020 Topical wound management initiated : 01/08/2020 Notes: Electronic Signature(s) Signed: 03/05/2020 5:19:54 PM By: Levan Hurst RN, BSN Entered By: Levan Hurst on 03/04/2020 17:09:06 -------------------------------------------------------------------------------- Pain Assessment Details Patient  Name: Date of Service: Nicholas Junes DFO RD K. 03/04/2020 3:00 PM Medical Record Number: [id number] Patient Account Number: [id number] Date of Birth/Sex: Treating RN: [birth year] (73 y.o. Nicholas [name] Primary Care Jan Walters: [name] Other Clinician: Referring Kerilyn Cortner: Treating Evamae Rowen/Extender: Dossie Der in Treatment: 8 Active Problems Location of Pain Severity and Description of Pain Patient Has Paino No Site Locations Pain Management and Medication Current Pain Management: Electronic Signature(s) Signed: 03/04/2020 5:07:35 PM By: Carlene Coria RN Entered By: Carlene Coria on 03/04/2020 15:15:11 -------------------------------------------------------------------------------- Patient/Caregiver Education Details Patient Name: Date of Service: Nicholas Junes DFO RD K. 8/12/2021andnbsp3:00 PM Medical Record Number: [id number] Patient Account Number: [id number] Date of Birth/Gender: Treating RN: 15-Oct-1945 (74 y.o. Nicholas [name] Primary Care Physician: [name] Other Clinician: Referring Physician: Treating Physician/Extender: Dossie Der in Treatment: 8 Education Assessment Education Provided To: Patient Education Topics Provided Wound/Skin Impairment: Methods: Explain/Verbal Responses: State content correctly [employer]) Signed: 03/05/2020 5:19:54 PM By: Levan Hurst RN, BSN Entered By: Levan Hurst on 03/04/2020 17:09:14 -------------------------------------------------------------------------------- Wound Assessment Details Patient Name: Date of Service: Nicholas Junes DFO RD K. 03/04/2020 3:00 PM Medical Record Number: [id number] Patient Account Number: [id number] Date of Birth/Sex: Treating RN:  1945/12/27 (73 y.o. Nicholas [name] Primary Care Britni Driscoll: [name] Other Clinician: Referring Yovanni Frenette: Treating Nicholas [name]/Extender: Dossie Der in Treatment:  8 Wound Status Wound Number: 10 Primary Lymphedema Etiology: Wound Location: Right, Lateral Lower Leg Wound Open Wounding Event: Gradually Appeared Status: Date Acquired: 02/12/2020 Comorbid Lymphedema, Sleep Apnea, Congestive Heart Failure, Weeks Of Treatment: 3 History: Hypertension, Peripheral Venous Disease, Received Clustered Wound: No Chemotherapy Wound Measurements Length: (cm) Width: (cm) Depth: (cm) Area: (cm) Volume: (cm) Wound Description Classification: Full Thickness Without Exposed Support Structures Exudate Amount: None Present Foul Odor After Cleansing: Slough/Fibrino 0 % Reduction in Area: 100% 0 % Reduction in Volume: 100% 0 Epithelialization: Large (67-100%) 0 Tunneling: No 0 Undermining: No No No Wound Bed Granulation Amount: None Present (0%) Exposed Structure Necrotic Amount: None Present (0%) Fascia Exposed: No Fat Layer (Subcutaneous Tissue) Exposed: No Tendon Exposed: No Muscle Exposed: No Joint Exposed: No Bone Exposed: No Electronic Signature(s) Signed: 03/04/2020 5:07:35 PM By: Carlene Coria RN Entered By: Carlene Coria on 03/04/2020 15:23:45 -------------------------------------------------------------------------------- Wound Assessment Details Patient Name: Date of Service: Nicholas Junes DFO RD K. 03/04/2020 3:00 PM Medical Record Number: [id number] Patient Account Number: [id number] Date of Birth/Sex: Treating RN: [birth year] (73 y.o. Nicholas [name]) Carlene Coria Primary Care Christin Moline: [name] Other Clinician: Referring Sofiah Lyne: Treating Brenlynn Fake/Extender: Dossie Der in Treatment: 8 Wound Status Wound Number: 11 Primary Lymphedema Etiology: Wound Location: Left, Medial Lower Leg Wound Open Wounding Event: Blister Status: Date Acquired: 03/01/2020 Comorbid Lymphedema, Sleep Apnea, Congestive Heart Failure, Weeks Of Treatment: 0 History: Hypertension, Peripheral Venous Disease, Received Clustered Wound: No  Chemotherapy Wound Measurements Length: (cm) 1.2 Width: (cm) 1.7 Depth: (cm) 0.1 Area: (cm) 1.602 Volume: (cm) 0.16 % Reduction in Area: % Reduction in Volume: Epithelialization: None Tunneling: No Undermining: No Wound Description Classification: Full Thickness Without Exposed Support Structures Exudate Amount: Medium Exudate Type: Serosanguineous Exudate Color: red, brown Foul Odor After Cleansing: No Slough/Fibrino Yes Wound Bed Granulation Amount: Medium (34-66%) Exposed Structure Granulation Quality: Pink Fascia Exposed: No Necrotic Amount: Medium (34-66%) Fat Layer (Subcutaneous Tissue) Exposed: Yes Necrotic Quality: Adherent Slough Tendon Exposed: No Muscle Exposed: No Joint Exposed: No Bone Exposed: No Treatment Notes Wound #11 (Left, Medial Lower Leg) 1. Cleanse With Wound Cleanser Soap and water 3. Primary Dressing Applied Calcium Alginate Ag 4. Secondary Dressing ABD Pad 6. Support Layer Applied 4 layer compression wrap Notes unna boot at top and toes for security of 4 Hydrologist) Signed: 03/04/2020 5:07:35 PM By: Carlene Coria RN Entered By: Carlene Coria on 03/04/2020 15:27:37 -------------------------------------------------------------------------------- Wound Assessment Details Patient Name: Date of Service: Nicholas Junes DFO RD K. 03/04/2020 3:00 PM Medical Record Number: [id number] Patient Account Number: [id number] Date of Birth/Sex: Treating RN: 1945/09/29 (73 y.o. Nicholas [name]) Carlene Coria Primary Care Marietta Sikkema: [name] Other Clinician: Referring Railee Bonillas: Treating Nicholas [name]/Extender: Dossie Der in Treatment: 8 Wound Status Wound Number: 12 Primary Lymphedema Etiology: Wound Location: Left, Dorsal Foot Wound Open Wounding Event: Blister Status: Date Acquired: 03/01/2020 Comorbid Lymphedema, Sleep Apnea, Congestive Heart Failure, Weeks Of Treatment: 0 History: Hypertension, Peripheral Venous  Disease, Received Clustered Wound: Yes Chemotherapy Wound Measurements Length: (cm) 1.2 Width: (cm) 1.5 Depth: (cm) 0.1 Area: (cm) 1.414 Volume: (cm) 0.141 % Reduction in Area: % Reduction in Volume: Epithelialization: None Tunneling: No Undermining: No Wound Description Classification: Full Thickness Without Exposed Support Structures Exudate Amount: Medium Exudate Type: Serosanguineous Exudate Color: red, brown Foul Odor After Cleansing: No Slough/Fibrino Yes Wound  Bed Granulation Amount: Medium (34-66%) Exposed Structure Granulation Quality: Pink Fascia Exposed: No Necrotic Amount: Medium (34-66%) Fat Layer (Subcutaneous Tissue) Exposed: Yes Necrotic Quality: Adherent Slough Tendon Exposed: No Muscle Exposed: No Joint Exposed: No Bone Exposed: No Treatment Notes Wound #12 (Left, Dorsal Foot) 1. Cleanse With Wound Cleanser Soap and water 3. Primary Dressing Applied Calcium Alginate Ag 4. Secondary Dressing ABD Pad 6. Support Layer Applied 4 layer compression wrap Notes unna boot at top and toes for security of 4 Hydrologist) Signed: 03/04/2020 5:07:35 PM By: Carlene Coria RN Entered By: Carlene Coria on 03/04/2020 15:29:00 -------------------------------------------------------------------------------- Wound Assessment Details Patient Name: Date of Service: Nicholas Junes DFO RD K. 03/04/2020 3:00 PM Medical Record Number: [id number] Patient Account Number: [id number] Date of Birth/Sex: Treating RN: Mar 07, 1946 (73 y.o. Nicholas [name]) Carlene Coria Primary Care Chetara Kropp: [name] Other Clinician: Referring Revella Shelton: Treating Nicholas [name]/Extender: Dossie Der in Treatment: 8 Wound Status Wound Number: 13 Primary Lymphedema Etiology: Wound Location: Right Foot Wound Open Wounding Event: Blister Status: Date Acquired: 03/01/2020 Comorbid Lymphedema, Sleep Apnea, Congestive Heart Failure, Weeks Of Treatment: 0 History:  Hypertension, Peripheral Venous Disease, Received Clustered Wound: No Chemotherapy Wound Measurements Length: (cm) 3 Width: (cm) 7.5 Depth: (cm) 0.1 Area: (cm) 17.671 Volume: (cm) 1.767 % Reduction in Area: % Reduction in Volume: Epithelialization: None Tunneling: No Undermining: No Wound Description Classification: Full Thickness Without Exposed Support Structures Exudate Amount: Medium Exudate Type: Serosanguineous Exudate Color: red, brown Foul Odor After Cleansing: No Slough/Fibrino Yes Wound Bed Granulation Amount: Medium (34-66%) Exposed Structure Granulation Quality: Pink Fascia Exposed: No Necrotic Amount: Medium (34-66%) Fat Layer (Subcutaneous Tissue) Exposed: Yes Necrotic Quality: Adherent Slough Tendon Exposed: No Muscle Exposed: No Joint Exposed: No Bone Exposed: No Treatment Notes Wound #13 (Right Foot) 1. Cleanse With Wound Cleanser Soap and water 3. Primary Dressing Applied Calcium Alginate Ag 4. Secondary Dressing ABD Pad 6. Support Layer Applied 4 layer compression wrap Notes unna boot at top and toes for security of 4 Hydrologist) Signed: 03/04/2020 5:07:35 PM By: Carlene Coria RN Entered By: Carlene Coria on 03/04/2020 15:30:39 -------------------------------------------------------------------------------- Wound Assessment Details Patient Name: Date of Service: Nicholas Junes DFO RD K. 03/04/2020 3:00 PM Medical Record Number: [id number] Patient Account Number: [id number] Date of Birth/Sex: Treating RN: 1945/11/17 (73 y.o. Nicholas [name]) Carlene Coria Primary Care Joseluis Alessio: [name] Other Clinician: Referring Sri Clegg: Treating Rickayla Wieland/Extender: Dossie Der in Treatment: 8 Wound Status Wound Number: 8 Primary Lymphedema Etiology: Wound Location: Left, Proximal Lower Leg Wound Open Wounding Event: Gradually Appeared Status: Date Acquired: 02/12/2020 Comorbid Lymphedema, Sleep Apnea, Congestive Heart  Failure, Weeks Of Treatment: 3 History: Hypertension, Peripheral Venous Disease, Received Clustered Wound: No Chemotherapy Wound Measurements Length: (cm) Width: (cm) Depth: (cm) Area: (cm) Volume: (cm) 0 % Reduction in Area: 100% 0 % Reduction in Volume: 100% 0 Tunneling: No 0 Undermining: No 0 Wound Description Classification: Full Thickness Without Exposed Support Structures Exudate Amount: None Present Foul Odor After Cleansing: No Slough/Fibrino No Wound Bed Granulation Amount: None Present (0%) Exposed Structure Necrotic Amount: None Present (0%) Fascia Exposed: No Fat Layer (Subcutaneous Tissue) Exposed: No Tendon Exposed: No Muscle Exposed: No Joint Exposed: No Bone Exposed: No Electronic Signature(s) Signed: 03/04/2020 5:07:35 PM By: Carlene Coria RN Entered By: Carlene Coria on 03/04/2020 15:25:06 -------------------------------------------------------------------------------- Wound Assessment Details Patient Name: Date of Service: Nicholas Junes DFO RD K. 03/04/2020 3:00 PM Medical Record Number: [id number] Patient Account Number: [id number] Date of Birth/Sex: Treating  RN: [birth year] (74 y.o. Nicholas [name]) Nicholas [name], Nicholas [name] Primary Care Marvelous Bouwens: [name] Other Clinician: Referring Tikita Mabee: Treating Kamarii Carton/Extender: Dossie Der in Treatment: 8 Wound Status Wound Number: 9 Primary Lymphedema Etiology: Wound Location: Right, Anterior Lower Leg Wound Open Wounding Event: Gradually Appeared Status: Date Acquired: 02/12/2020 Comorbid Lymphedema, Sleep Apnea, Congestive Heart Failure, Weeks Of Treatment: 3 History: Hypertension, Peripheral Venous Disease, Received Clustered Wound: No Chemotherapy Wound Measurements Length: (cm) Width: (cm) Depth: (cm) Area: (cm) Volume: (cm) 0 % Reduction in Area: 100% 0 % Reduction in Volume: 100% 0 Tunneling: No 0 Undermining: No 0 Wound Description Classification: Full Thickness Without Exposed  Support Structures Exudate Amount: None Present Foul Odor After Cleansing: No Wound Bed Granulation Amount: None Present (0%) Exposed Structure Necrotic Amount: None Present (0%) Fascia Exposed: No Fat Layer (Subcutaneous Tissue) Exposed: No Tendon Exposed: No Muscle Exposed: No Joint Exposed: No Bone Exposed: No Electronic Signature(s) Signed: 03/04/2020 5:07:35 PM By: Carlene Coria RN Entered By: Carlene Coria on 03/04/2020 15:25:27 -------------------------------------------------------------------------------- Vitals Details Patient Name: Date of Service: Nicholas Junes DFO RD K. 03/04/2020 3:00 PM Medical Record Number: [id number] Patient Account Number: [id number] Date of Birth/Sex: Treating RN: [birth year] (73 y.o. Nicholas [name]) Carlene Coria Primary Care Kaston Faughn: [name] Other Clinician: Referring Baby Stairs: Treating Jacquilyn Seldon/Extender: Dossie Der in Treatment: 8 Vital Signs Time Taken: 15:14 Temperature (F): 97.8 Height (in): 67 Pulse (bpm): 90 Weight (lbs): 250 Respiratory Rate (breaths/min): 18 Body Mass Index (BMI): 39.2 Blood Pressure (mmHg): 132/77 Reference Range: 80 - 120 mg / dl Electronic Signature(s) Signed: 03/04/2020 5:07:35 PM By: Carlene Coria RN Entered By: Carlene Coria on 03/04/2020 15:15:01

## 2020-03-08 ENCOUNTER — Encounter (HOSPITAL_BASED_OUTPATIENT_CLINIC_OR_DEPARTMENT_OTHER): Payer: PPO | Admitting: Internal Medicine

## 2020-03-08 ENCOUNTER — Other Ambulatory Visit: Payer: Self-pay

## 2020-03-08 DIAGNOSIS — I890 Lymphedema, not elsewhere classified: Secondary | ICD-10-CM | POA: Diagnosis not present

## 2020-03-08 DIAGNOSIS — L97812 Non-pressure chronic ulcer of other part of right lower leg with fat layer exposed: Secondary | ICD-10-CM | POA: Diagnosis not present

## 2020-03-08 NOTE — Progress Notes (Addendum)
Madej Casson K. ([id number]) , Visit Report for 03/08/2020 Arrival Information Details Patient Name: Date of Service: RO O T, BRA DFO RD K. 03/08/2020 3:30 PM Medical Record Number: [id number] Patient Account Number: [id number] Date of Birth/Sex: Treating RN: 06/18/1946 (73 y.o. M) Dwiggins, Shannon Primary Care : Hunter, Stephen Other Clinician: Referring : Treating /Extender: Robson, Michael Hunter, Stephen Weeks in Treatment: 8 Visit Information History Since Last Visit Added or deleted any medications: No Patient Arrived: Wheel Chair Any new allergies or adverse reactions: No Arrival Time: 16:05 Had a fall or experienced change in No Accompanied By: self activities of daily living that may affect Transfer Assistance: None risk of falls: Patient Identification Verified: Yes Signs or symptoms of abuse/neglect since last visito No Secondary Verification Process Completed: Yes Hospitalized since last visit: No Patient Requires Transmission-Based Precautions: No Implantable device outside of the clinic excluding No Patient Has Alerts: Yes cellular tissue based products placed in the center Patient Alerts: ABI:  07/2018 since last visit: Has Dressing in Place as Prescribed: Yes Has Compression in Place as Prescribed: Yes Pain Present Now: No Electronic Signature(s) Signed: 03/08/2020 5:24:06 PM By: Dwiggins, Shannon Entered By: Dwiggins, Shannon on 03/08/2020 16:07:05 -------------------------------------------------------------------------------- Compression Therapy Details Patient Name: Date of Service: RO O T, BRA DFO RD K. 03/08/2020 3:30 PM Medical Record Number: [id number] Patient Account Number: [id number] Date of Birth/Sex: Treating RN: 11/01/1945 (73 y.o. M) Lynch, Shatara Primary Care : Hunter, Stephen Other Clinician: Referring : Treating /Extender: Robson, Michael Hunter, Stephen Weeks in Treatment: 8 Compression  Therapy Performed for Wound Assessment: Wound #14 Right,Medial Lower Leg Performed By: Clinician Lynch, Shatara, RN Compression Type: Four Layer Post Procedure Diagnosis Same as Pre-procedure Electronic Signature(s) Signed: 03/08/2020 5:14:32 PM By: Lynch, Shatara RN, BSN Entered By: Lynch, Shatara on 03/08/2020 16:49:20 -------------------------------------------------------------------------------- Encounter Discharge Information Details Patient Name: Date of Service: RO O T, BRA DFO RD K. 03/08/2020 3:30 PM Medical Record Number: [id number] Patient Account Number: [id number] Date of Birth/Sex: Treating RN: 09/01/1945 (73 y.o. M) Dwiggins, Shannon Primary Care : Hunter, Stephen Other Clinician: Referring : Treating /Extender: Robson, Michael Hunter, Stephen Weeks in Treatment: 8 Encounter Discharge Information Items Discharge Condition: Stable Ambulatory Status: Wheelchair Discharge Destination: Home Transportation: Other Accompanied By: wife Schedule Follow-up Appointment: Yes Clinical Summary of Care: Patient Declined Electronic Signature(s) Signed: 03/08/2020 5:24:06 PM By: Dwiggins, Shannon Entered By: Dwiggins, Shannon on 03/08/2020 17:21:05 -------------------------------------------------------------------------------- Lower Extremity Assessment Details Patient Name: Date of Service: RO O T, BRA DFO RD K. 03/08/2020 3:30 PM Medical Record Number: [id number] Patient Account Number: [id number] Date of Birth/Sex: Treating RN: 09/09/1945 (73 y.o. M) Dwiggins, Shannon Primary Care : Hunter, Stephen Other Clinician: Referring : Treating /Extender: Robson, Michael Hunter, Stephen Weeks in Treatment: 8 Edema Assessment Assessed: [Left: No] [Right: No] Edema: [Left: Yes] [Right: Yes] Calf Left: Right: Point of Measurement: 40 cm From Medial Instep 39 cm 42 cm Ankle Left: Right: Point of Measurement: 19 cm From Medial  Instep 33 cm 36 cm Vascular Assessment Pulses: Dorsalis Pedis Palpable: [Left:No] [Right:No] Electronic Signature(s) Signed: 03/08/2020 5:24:06 PM By: Dwiggins, Shannon Entered By: Dwiggins, Shannon on 03/08/2020 16:19:28 -------------------------------------------------------------------------------- Multi Wound Chart Details Patient Name: Date of Service: RO O T, BRA DFO RD K. 03/08/2020 3:30 PM Medical Record Number: [id number] Patient Account Number: [id number] Date of Birth/Sex: Treating RN: 05/28/1946 (73 y.o. M) Lynch, Shatara Primary Care : Hunter, Stephen Other Clinician: Referring : Treating /Extender: Robson, Michael Hunter, Stephen Weeks in Treatment: 8 Vital Signs Height(in): 67 Pulse(bpm): 75 Weight(lbs):   250 Blood Pressure(mmHg): 128/76 Body Mass Index(BMI): 39 Temperature(°F): 97.9 Respiratory Rate(breaths/min): 19 Photos: [11:No Photos Left, Medial Lower Leg] [12:No Photos Left, Dorsal Foot] [13:No Photos Right Foot] Wound Location: [11:Blister] [12:Blister] [13:Blister] Wounding Event: [11:Lymphedema] [12:Lymphedema] [13:Lymphedema] Primary Etiology: [11:Lymphedema, Sleep Apnea,] [12:Lymphedema, Sleep Apnea,] [13:Lymphedema, Sleep Apnea,] Comorbid History: [11:Congestive Heart Failure, Hypertension, Peripheral Venous Disease, Received Chemotherapy 03/01/2020] [12:Congestive Heart Failure, Hypertension, Peripheral Venous Disease, Received Chemotherapy 03/01/2020] [13:Congestive Heart Failure,  Hypertension, Peripheral Venous Disease, Received Chemotherapy 03/01/2020] Date Acquired: [11:0] [12:0] [13:0] Weeks of Treatment: [11:Healed - Epithelialized] [12:Healed - Epithelialized] [13:Healed - Epithelialized] Wound Status: [11:No] [12:Yes] [13:No] Clustered Wound: [11:0x0x0] [12:0x0x0] [13:0x0x0] Measurements L x W x D (cm) [11:0] [12:0] [13:0] A (cm) : rea [11:0] [12:0] [13:0] Volume (cm) : [11:100.00%] [12:100.00%] [13:100.00%] %  Reduction in Area: [11:100.00%] [12:100.00%] [13:100.00%] % Reduction in Volume: [11:Full Thickness Without Exposed] [12:Full Thickness Without Exposed] [13:Full Thickness Without Exposed] Classification: [11:Support Structures None Present] [12:Support Structures None Present] [13:Support Structures None Present] Exudate Amount: [11:N/A] [12:N/A] [13:N/A] Exudate Type: [11:N/A] [12:N/A] [13:N/A] Exudate Color: [11:Distinct, outline attached] [12:Distinct, outline attached] [13:Distinct, outline attached] Wound Margin: [11:None Present (0%)] [12:None Present (0%)] [13:None Present (0%)] Granulation Amount: [11:N/A] [12:N/A] [13:N/A] Granulation Quality: [11:None Present (0%)] [12:None Present (0%)] [13:None Present (0%)] Necrotic Amount: [11:Fascia: No] [12:Fascia: No] [13:Fascia: No] Exposed Structures: [11:Fat Layer (Subcutaneous Tissue) Exposed: No Tendon: No Muscle: No Joint: No Bone: No Large (67-100%)] [12:Fat Layer (Subcutaneous Tissue) Exposed: No Tendon: No Muscle: No Joint: No Bone: No Large (67-100%)] [13:Fat Layer (Subcutaneous Tissue)  Exposed: No Tendon: No Muscle: No Joint: No Bone: No Large (67-100%)] Epithelialization: [11:N/A] [12:N/A] [13:N/A] Wound Number: 14 N/A N/A Photos: No Photos N/A N/A Right, Medial Lower Leg N/A N/A Wound Location: Gradually Appeared N/A N/A Wounding Event: Lymphedema N/A N/A Primary Etiology: Lymphedema, Sleep Apnea, N/A N/A Comorbid History: Congestive Heart Failure, Hypertension, Peripheral Venous Disease, Received Chemotherapy 03/08/2020 N/A N/A Date Acquired: 0 N/A N/A Weeks of Treatment: Open N/A N/A Wound Status: No N/A N/A Clustered Wound: 0.5x1x0.1 N/A N/A Measurements L x W x D (cm) 0.393 N/A N/A A (cm) : rea 0.039 N/A N/A Volume (cm) : N/A N/A N/A % Reduction in Area: N/A N/A N/A % Reduction in Volume: Full Thickness Without Exposed N/A N/A Classification: Support Structures Medium N/A N/A Exudate  Amount: Serous N/A N/A Exudate Type: amber N/A N/A Exudate Color: Distinct, outline attached N/A N/A Wound Margin: Large (67-100%) N/A N/A Granulation Amount: Red, Pink N/A N/A Granulation Quality: None Present (0%) N/A N/A Necrotic Amount: Fat Layer (Subcutaneous Tissue) N/A N/A Exposed Structures: Exposed: Yes Fascia: No Tendon: No Muscle: No Joint: No Bone: No None N/A N/A Epithelialization: Compression Therapy N/A N/A Procedures Performed: Treatment Notes Wound #14 (Right, Medial Lower Leg) 1. Cleanse With Wound Cleanser Soap and water 2. Periwound Care Moisturizing lotion 3. Primary Dressing Applied Calcium Alginate Ag 4. Secondary Dressing ABD Pad 6. Support Layer Applied 4 layer compression wrap Notes unna boot to foot and top of leg. stockinette Electronic Signature(s) Signed: 03/08/2020 5:53:04 PM By: Robson, Michael MD Signed: 03/11/2020 5:36:57 PM By: Lynch, Shatara RN, BSN Entered By: Robson, Michael on 03/08/2020 17:31:05 -------------------------------------------------------------------------------- Multi-Disciplinary Care Plan Details Patient Name: Date of Service: RO O T, BRA DFO RD K. 03/08/2020 3:30 PM Medical Record Number: [id number] Patient Account Number: [id number] Date of Birth/Sex: Treating RN: 06/27/1946 (73 y.o. M) Lynch, Shatara Primary Care : Hunter, Stephen Other Clinician: Referring : Treating /Extender: Robson, Michael Hunter, Stephen Weeks in Treatment: 8   Active Inactive Venous Leg Ulcer Nursing Diagnoses: Potential for venous Insuffiency (use before diagnosis confirmed) Goals: Patient will maintain optimal edema control Date Initiated: 01/08/2020 Target Resolution Date: 04/02/2020 Goal Status: Active Patient/caregiver will verbalize understanding of disease process and disease management Date Initiated: 01/08/2020 Date Inactivated: 02/05/2020 Target Resolution Date: 01/30/2020 Goal Status:  Met Interventions: Assess peripheral edema status every visit. Notes: Wound/Skin Impairment Nursing Diagnoses: Knowledge deficit related to ulceration/compromised skin integrity Goals: Patient/caregiver will verbalize understanding of skin care regimen Date Initiated: 01/08/2020 Target Resolution Date: 04/02/2020 Goal Status: Active Ulcer/skin breakdown will have a volume reduction of 30% by week 4 Date Initiated: 01/08/2020 Date Inactivated: 02/05/2020 Target Resolution Date: 01/30/2020 Goal Status: Met Interventions: Assess patient/caregiver ability to obtain necessary supplies Assess patient/caregiver ability to perform ulcer/skin care regimen upon admission and as needed Provide education on ulcer and skin care Treatment Activities: Skin care regimen initiated : 01/08/2020 Topical wound management initiated : 01/08/2020 Notes: Electronic Signature(s) Signed: 03/08/2020 5:14:32 PM By: Lynch, Shatara RN, BSN Entered By: Lynch, Shatara on 03/08/2020 16:47:07 -------------------------------------------------------------------------------- Pain Assessment Details Patient Name: Date of Service: RO O T, BRA DFO RD K. 03/08/2020 3:30 PM Medical Record Number: [id number] Patient Account Number: [id number] Date of Birth/Sex: Treating RN: 10/18/1945 (73 y.o. M) Dwiggins, Shannon Primary Care : Hunter, Stephen Other Clinician: Referring : Treating /Extender: Robson, Michael Hunter, Stephen Weeks in Treatment: 8 Active Problems Location of Pain Severity and Description of Pain Patient Has Paino No Site Locations Pain Management and Medication Current Pain Management: Electronic Signature(s) Signed: 03/08/2020 5:24:06 PM By: Dwiggins, Shannon Entered By: Dwiggins, Shannon on 03/08/2020 16:18:47 -------------------------------------------------------------------------------- Patient/Caregiver Education Details Patient Name: Date of Service: RO O T, BRA DFO RD  K. 8/16/2021andnbsp3:30 PM Medical Record Number: [id number] Patient Account Number: [id number] Date of Birth/Gender: Treating RN: 01/25/1946 (73 y.o. M) Lynch, Shatara Primary Care Physician: Hunter, Stephen Other Clinician: Referring Physician: Treating Physician/Extender: Robson, Michael Hunter, Stephen Weeks in Treatment: 8 Education Assessment Education Provided To: Patient Education Topics Provided Wound/Skin Impairment: Methods: Explain/Verbal Responses: State content correctly Electronic Signature(s) Signed: 03/08/2020 5:14:32 PM By: Lynch, Shatara RN, BSN Entered By: Lynch, Shatara on 03/08/2020 16:47:24 -------------------------------------------------------------------------------- Wound Assessment Details Patient Name: Date of Service: RO O T, BRA DFO RD K. 03/08/2020 3:30 PM Medical Record Number: [id number] Patient Account Number: [id number] Date of Birth/Sex: Treating RN: 05/11/1946 (73 y.o. M) Dwiggins, Shannon Primary Care : Hunter, Stephen Other Clinician: Referring : Treating /Extender: Robson, Michael Hunter, Stephen Weeks in Treatment: 8 Wound Status Wound Number: 11 Primary Lymphedema Etiology: Wound Location: Left, Medial Lower Leg Wound Healed - Epithelialized Wounding Event: Blister Status: Date Acquired: 03/01/2020 Comorbid Lymphedema, Sleep Apnea, Congestive Heart Failure, Weeks Of Treatment: 0 History: Hypertension, Peripheral Venous Disease, Received Chemotherapy Clustered Wound: No Photos Photo Uploaded By: Jones, Dedrick on 03/10/2020 14:59:57 Wound Measurements Length: (cm) Width: (cm) Depth: (cm) Area: (cm) Volume: (cm) 0 % Reduction in Area: 100% 0 % Reduction in Volume: 100% 0 Epithelialization: Large (67-100%) 0 Tunneling: No 0 Undermining: No Wound Description Classification: Full Thickness Without Exposed Support Structures Wound Margin: Distinct, outline attached Exudate Amount: None Present Foul  Odor After Cleansing: No Slough/Fibrino No Wound Bed Granulation Amount: None Present (0%) Exposed Structure Necrotic Amount: None Present (0%) Fascia Exposed: No Fat Layer (Subcutaneous Tissue) Exposed: No Tendon Exposed: No Muscle Exposed: No Joint Exposed: No Bone Exposed: No Electronic Signature(s) Signed: 03/08/2020 5:24:06 PM By: Dwiggins, Shannon Entered By: Dwiggins, Shannon on 03/08/2020 16:21:57 -------------------------------------------------------------------------------- Wound Assessment Details Patient Name: Date of Service:   RO Rosalio Macadamia, BRA DFO RD K. 03/08/2020 3:30 PM Medical Record Number: [id number] Patient Account Number: [id number] Date of Birth/Sex: Treating RN: Mar 31, 1946 (74 y.o. [name] Primary Care Hawke Villalpando: [name] Other Clinician: Referring Myla Mauriello: Treating Holy Battenfield/Extender: [name] in Treatment: 8 Wound Status Wound Number: 12 Primary Lymphedema Etiology: Wound Location: Left, Dorsal Foot Wound Healed - Epithelialized Wounding Event: Blister Status: Date Acquired: 03/01/2020 Comorbid Lymphedema, Sleep Apnea, Congestive Heart Failure, Weeks Of Treatment: 0 History: Hypertension, Peripheral Venous Disease, Received Chemotherapy Clustered Wound: Yes Photos Photo Uploaded By: Mikeal Hawthorne on 03/10/2020 14:59:57 Wound Measurements Length: (cm) Width: (cm) Depth: (cm) Area: (cm) Volume: (cm) 0 % Reduction in Area: 100% 0 % Reduction in Volume: 100% 0 Epithelialization: Large (67-100%) 0 Tunneling: No 0 Undermining: No Wound Description Classification: Full Thickness Without Exposed Support Structures Wound Margin: Distinct, outline attached Exudate Amount: None Present Foul Odor After Cleansing: No Slough/Fibrino No Wound Bed Granulation Amount: None Present (0%) Exposed Structure Necrotic Amount: None Present (0%) Fascia Exposed: No Fat Layer (Subcutaneous Tissue) Exposed: No Tendon  Exposed: No Muscle Exposed: No Joint Exposed: No Bone Exposed: No Electronic Signature(s) Signed: 03/08/2020 5:24:06 PM By: Kela Millin Entered By: Kela Millin on 03/08/2020 16:22:32 -------------------------------------------------------------------------------- Wound Assessment Details Patient Name: Date of Service: [name] 03/08/2020 3:30 PM Medical Record Number: [id number] Patient Account Number: [id number] Date of Birth/Sex: Treating RN: Nov 18, 1945 (74 y.o. [name] Primary Care Grettel Rames: [name] Other Clinician: Referring Estelle Greenleaf: Treating Felicity Penix/Extender: [name] in Treatment: 8 Wound Status Wound Number: 13 Primary Lymphedema Etiology: Wound Location: Right Foot Wound Healed - Epithelialized Wounding Event: Blister Status: Date Acquired: 03/01/2020 Comorbid Lymphedema, Sleep Apnea, Congestive Heart Failure, Weeks Of Treatment: 0 History: Hypertension, Peripheral Venous Disease, Received Chemotherapy Clustered Wound: No Photos Photo Uploaded By: Mikeal Hawthorne on 03/10/2020 16:06:41 Wound Measurements Length: (cm) Width: (cm) Depth: (cm) Area: (cm) Volume: (cm) 0 % Reduction in Area: 100% 0 % Reduction in Volume: 100% 0 Epithelialization: Large (67-100%) 0 Tunneling: No 0 Undermining: No Wound Description Classification: Full Thickness Without Exposed Support Structures Wound Margin: Distinct, outline attached Exudate Amount: None Present Foul Odor After Cleansing: No Slough/Fibrino No Wound Bed Granulation Amount: None Present (0%) Exposed Structure Necrotic Amount: None Present (0%) Fascia Exposed: No Fat Layer (Subcutaneous Tissue) Exposed: No Tendon Exposed: No Muscle Exposed: No Joint Exposed: No Bone Exposed: No Electronic Signature(s) Signed: 03/08/2020 5:24:06 PM By: Kela Millin Entered By: Kela Millin on 03/08/2020  16:20:01 -------------------------------------------------------------------------------- Wound Assessment Details Patient Name: Date of Service: [name] 03/08/2020 3:30 PM Medical Record Number: [id number] Patient Account Number: [id number] Date of Birth/Sex: Treating RN: Oct 10, 1945 (74 y.o. [name] Primary Care Jaskarn Schweer: [name] Other Clinician: Referring Maxwell Martorano: Treating Yailen Zemaitis/Extender: [name] in Treatment: 8 Wound Status Wound Number: 14 Primary Lymphedema Etiology: Wound Location: Right, Medial Lower Leg Wound Open Wounding Event: Gradually Appeared Status: Date Acquired: 03/08/2020 Comorbid Lymphedema, Sleep Apnea, Congestive Heart Failure, Weeks Of Treatment: 0 History: Hypertension, Peripheral Venous Disease, Received Chemotherapy Clustered Wound: No Photos Photo Uploaded By: Mikeal Hawthorne on 03/10/2020 16:06:41 Wound Measurements Length: (cm) 0.5 Width: (cm) 1 Depth: (cm) 0.1 Area: (cm) 0.393 Volume: (cm) 0.039 % Reduction in Area: % Reduction in Volume: Epithelialization: None Tunneling: No Undermining: No Wound Description Classification: Full Thickness Without Exposed Support Struc Wound Margin: Distinct, outline attached Exudate Amount: Medium Exudate Type: Serous Exudate Color: amber tures  Foul Odor After Cleansing: No Slough/Fibrino No Wound Bed Granulation Amount: Large (67-100%) Exposed Structure Granulation Quality: Red, Pink Fascia Exposed: No Necrotic Amount: None Present (0%) Fat Layer (Subcutaneous Tissue) Exposed: Yes Tendon Exposed: No Muscle Exposed: No Joint Exposed: No Bone Exposed: No Treatment Notes Wound #14 (Right, Medial Lower Leg) 1. Cleanse With Wound Cleanser Soap and water 2. Periwound Care Moisturizing lotion 3. Primary Dressing Applied Calcium Alginate Ag 4. Secondary Dressing ABD Pad 6. Support Layer Applied 4 layer compression  wrap Notes unna boot to foot and top of leg. stockinette Electronic Signature(s) Signed: 03/08/2020 5:24:06 PM By: Dwiggins, Shannon Entered By: Dwiggins, Shannon on 03/08/2020 16:25:51 -------------------------------------------------------------------------------- Vitals Details Patient Name: Date of Service: RO O T, BRA DFO RD K. 03/08/2020 3:30 PM Medical Record Number: [id number] Patient Account Number: [id number] Date of Birth/Sex: Treating RN: 11/12/1945 (73 y.o. M) Dwiggins, Shannon Primary Care : Hunter, Stephen Other Clinician: Referring : Treating /Extender: Robson, Michael Hunter, Stephen Weeks in Treatment: 8 Vital Signs Time Taken: 16:04 Temperature (°F): 97.9 Height (in): 67 Pulse (bpm): 75 Weight (lbs): 250 Respiratory Rate (breaths/min): 19 Body Mass Index (BMI): 39.2 Blood Pressure (mmHg): 128/76 Reference Range: 80 - 120 mg / dl Electronic Signature(s) Signed: 03/08/2020 5:24:06 PM By: Dwiggins, Shannon Entered By: Dwiggins, Shannon on 03/08/2020 16:18:40 

## 2020-03-08 NOTE — Progress Notes (Signed)
ROGUE RAFALSKI ([id number]) , Visit Report for 03/08/2020 HPI Details Patient Name: Date of Service: Nicholas [name] DFO RD K. 03/08/2020 3:30 PM Medical Record Number: [id number] Patient Account Number: [id number] Date of Birth/Sex: Treating RN: Sep 29, 1945 (74 y.o. [name] Primary Care Provider: [name] Other Clinician: Referring Provider: Treating Provider/Extender: [name] in Treatment: 8 History of Present Illness HPI Description: ADMISSION 07/04/2018 This is a 74 year old man who has bilateral lymphedema felt to be secondary to previous treatment for malignant melanoma on his left toes. 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 Malignant melanoma was treated with immunotherapy. He is not had radiation. Sometime after this he developed edema in the left leg that spread into the right leg. He has bilateral compression pumps at home and he uses them twice a day and claims to be quite compliant. He also has a juxta lite type stocking at home which is apparently 104-year-old. He states that he fell in early October dislocating and fracturing his left shoulder. He slept in a recliner with his legs dependent. He was seen and followed at the rehab clinic at [facility] and was having lymphedema wraps done by 1 of the PT who noted his open wounds last week and he has been referred here for management. The patient has 2 open wounds on the right s distal lower leg and one on the left anterior lower leg The patient is not a diabetic. 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 For one reason or another we could not get him set up with home health and he kept the wraps on all week which he generally seem to tolerate. He did not use his compression pumps over the top of the wraps which I told him he could do this week. He comes in with a new rash on both feet which looks like tinea 07/22/2018; much better edema control bilaterally. The wounds on the right lateral calf and left anterior lower leg both look a lot better. There is epithelialization over both surfaces but I think this is fragile and I am going to put him in our compression wraps for another week. He says he is been using his compression pumps at home. He has a constellation of lower extremity compression garments that he is going to bring in next week. In discussion with him I cannot really get a sense of everything he has. I think he was at one point prescribed 30-40 below-knee stockings but he points out he could not get them on. 07/29/2018; the patient arrives in his legs are healed. He has a constellation of stockings none of which I think are going to be that helpful except for the fact that he has external wraparound stockings of some description. He has above-knee stockings but I cannot imagine it would be possible to get these on READMISSION 01/08/2020 Patient is now a 74 year old man. We had him for a short period from December 2019 through January 2020. He has bilateral lower extremity lymphedema which he initially traces to treatment for malignant melanoma 7 years ago. He has been followed at the lymphedema clinic on  [place] and recently has compression pumps that go up to his mid abdomen. He recently developed superficial wounds bilaterally. They will not manage these in the lymphedema clinic where the services are run by therapy. He has not been using his compression pumps. His significant other cannot get the stockings on his legs and the patient is only limited ability to help because  of Parkinson's disease Past medical history is essentially unchanged. He has sleep apnea, Parkinson's disease at some point labeled his Parkinson's plus, bilateral lymphedema, history of malignant melanoma. He does not have an arterial issue that I am aware of although his previous arterial studies have been noncompressible 7/1; patient we admitted the clinic 2 weeks ago. He has severe bilateral lymphedema. He had wounds predominantly on the left leg but also superficially on the right. He has compression pumps which he says he is using once a day. 7/15; 2-week follow-up. He comes in for nurse visit. He has severe bilateral lymphedema. He has upper abdominal level compression pumps which she is using twice a day. We have been wrapping both his lower legs the areas on the right leg are healed. He still has an area of left upper lateral and right anterior medial. Anterior medial wound is small. He is tolerating this well. 7/22; still significant lymphedema even with 4-layer compression and external compression pump usage. He said he had some itching on the right anterior leg he comes in with a large superficial area of skin breakdown anteriorly and a smaller area laterally on the right leg. He also has a rash on the left dorsal ankle crease. Some of these look pustular. I wonder if this is an occlusion issue from foam we put down here. He also states the only thing different this week is the did a 5-hour car ride with his legs dependent. There was nothing open on the right leg last week but we wrapped it in any case 7/27; culture of the blisters on his left anterior leg grew staph aureus. I gave him doxycycline which he started yesterday. He is already appear cleaner and dry. His major wound is on the right anterior lower leg and the right lateral lower leg. There is nothing else open on the left leg. He has better edema control today. 03/04/20-Patient comes in with new areas after he had a fall, on his  left dorsal foot and right dorsal foot and left medial proximal leg, there is significant amount of drainage into his dressing that was removed today which is actually greenish in color. It looks like he is completed a course of doxycycline for staph that grew from his blisters however he might need antipseudomonal coverage at this point 8/16; apparently the wounds originally were all healed until his last visit when after a fall he had areas on the left dorsal foot, right dorsal foot and left medial proximal leg. T oday all of these have healed and he has a small area on the left medial ankle. The patient has severe bilateral lymphedema which is managed usually with compression stockings/external compression stockings as well his mid abdomen compression pumps. He is still using the latter twice a day. Electronic Signature(s) Signed: 03/08/2020 5:53:04 PM By: Linton Ham MD Entered By: Linton Ham on 03/08/2020 17:32:22 -------------------------------------------------------------------------------- Physical Exam Details Patient Name: Date of Service: Nicholas [name] DFO RD K. 03/08/2020 3:30 PM Medical Record Number: [id number] Patient Account Number: [id number] Date of Birth/Sex: Treating RN: 10-22-1945 (74 y.o. [name] Primary  Care Provider: [name] Other Clinician: Referring Provider: Treating Provider/Extender: [name] in Treatment: 8 Constitutional Sitting or standing Blood Pressure is within target range for patient.. Pulse regular and within target range for patient.Marland Kitchen Respirations regular, non-labored and within target range.. Temperature is normal and within the target range for the patient.Marland Kitchen Appears in no distress. Respiratory work of breathing is normal. Cardiovascular Pedal pulses are not palpable through the lymphedema although his feet are warm bilaterally. Notes Wound exam; dorsal left foot and right foot ulcers are closed  left medial upper lower leg is closed. He has a new area on the medial right ankle. 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 03/08/2020 3:30 PM Medical Record Number: [id number] Patient Account Number: [id number] Date of Birth/Sex: Treating RN: [birth year] (74 y.o. [name] Primary Care Provider: [name] Other Clinician: Referring Provider: Treating Provider/Extender: [name] in Treatment: 8 Verbal / Phone Orders: No Diagnosis Coding ICD-10 Coding Code Description I89.0 Lymphedema, not elsewhere classified L97.811 Non-pressure chronic ulcer of other part of right lower leg limited to breakdown of skin L97.821 Non-pressure chronic ulcer of other part of left lower leg limited to breakdown of skin Follow-up Appointments Return Appointment in 1 week. Dressing Change Frequency Wound #14 Right,Medial Lower Leg Do not change entire dressing for one week. Skin Barriers/Peri-Wound Care Moisturizing lotion TCA Cream or Ointment - mixed with lotion to both legs liberally Wound Cleansing May shower with protection. - with cast protectors. Primary Wound Dressing Wound #14 Right,Medial Lower Leg Calcium Alginate with Silver Secondary Dressing Wound #14 Right,Medial Lower Leg ABD pad Edema Control 4 layer compression - Right Lower Extremity - unna boot at the foot and top of leg to anchor Avoid standing for long periods of time Elevate legs to the level of the heart or above for 30 minutes daily and/or when sitting, a frequency of: - throughout the day. Exercise regularly Support Garment 30-40 mm/Hg pressure to: - Juxtalite to left leg daily Segmental  Compressive Device. - use lymphedema pumps twice a day an hour each time. Once in the morning and once in the evening. Electronic Signature(s) Signed: 03/08/2020 5:14:32 PM By: Levan Hurst RN, BSN Signed: 03/08/2020 5:53:04 PM By: Linton Ham MD Entered By: Levan Hurst on 03/08/2020 16:48:45 -------------------------------------------------------------------------------- Problem List Details Patient Name: Date of Service: Nicholas [name] DFO RD K. 03/08/2020 3:30 PM Medical Record Number: [id number] Patient Account Number: [id number] Date of Birth/Sex: Treating RN: [birth year] (74 y.o. [name] Primary Care Provider: [name] Other Clinician: Referring Provider: Treating Provider/Extender: [name] in Treatment: 8 Active Problems ICD-10 Encounter Code Description Active Date MDM Diagnosis I89.0 Lymphedema, not elsewhere classified 01/08/2020 No Yes L97.811 Non-pressure chronic ulcer of other part of right lower leg limited to breakdown 01/08/2020 No Yes of skin L97.821 Non-pressure chronic ulcer of other part of left lower leg limited to breakdown 01/08/2020 No Yes of skin Inactive Problems Resolved Problems Electronic Signature(s) Signed: 03/08/2020 5:53:04 PM By: Linton Ham MD Previous Signature: 03/08/2020 5:14:32 PM Version By: Levan Hurst RN, BSN Entered By: Linton Ham on 03/08/2020 17:30:50 -------------------------------------------------------------------------------- Progress Note Details Patient Name: Date of Service: Nicholas [name] DFO RD K. 03/08/2020 3:30 PM Medical Record Number: [id number] Patient Account Number: [id number] Date  of Birth/Sex: Treating RN: 08-23-1945 (74 y.o. [name] Primary Care Provider: [name] Other Clinician: Referring Provider: Treating Provider/Extender: [name] in Treatment: 8 Subjective History of Present Illness  (HPI) ADMISSION 07/04/2018 This is a 74 year old man who has bilateral lymphedema felt to be secondary to previous treatment for malignant melanoma on his left toes. 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 Malignant melanoma was treated with immunotherapy. He is not had radiation. Sometime after this he developed edema in the left leg that spread into the right leg. He has bilateral compression pumps at home and he uses them twice a day and claims to be quite compliant. He also has a juxta lite type stocking at home which is apparently 62-year-old. He states that he fell in early October dislocating and fracturing his left shoulder. He slept in a recliner with his legs dependent. He was seen and followed at the rehab clinic at [facility] and was having lymphedema wraps done by 1 of the PT who noted his open wounds last week and he has been referred here for management. The patient has 2 open wounds on the right s distal lower leg and one on the left anterior lower leg The patient is not a diabetic. 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 For one reason or another we could not get him set up with home health and he kept the wraps on all week which he generally seem to tolerate. He did not use his compression pumps over the top of the wraps which I told him he could do this week. He comes in with a new rash on both feet which looks like tinea 07/22/2018; much better edema control bilaterally. The wounds on the right lateral calf and left anterior lower leg both look a lot better. There is  epithelialization over both surfaces but I think this is fragile and I am going to put him in our compression wraps for another week. He says he is been using his compression pumps at home. ooHe has a constellation of lower extremity compression garments that he is going to bring in next week. In discussion with him I cannot really get a sense of everything he has. I think he was at one point prescribed 30-40 below-knee stockings but he points out he could not get them on. 07/29/2018; the patient arrives in his legs are healed. He has a constellation of stockings none of which I think are going to be that helpful except for the fact that he has external wraparound stockings of some description. He has above-knee stockings but I cannot imagine it would be possible to get these on READMISSION 01/08/2020 Patient is now a 74 year old man. We had him for a short period from December 2019 through January 2020. He has bilateral lower extremity lymphedema which he initially traces to treatment for malignant melanoma 7 years ago. He has been followed at the lymphedema clinic on [facility] and recently has compression pumps that go up to his mid abdomen. He recently developed superficial wounds bilaterally. They will not manage these in the lymphedema clinic where the services are run  by therapy. He has not been using his compression pumps. His significant other cannot get the stockings on his legs and the patient is only limited ability to help because of Parkinson's disease Past medical history is essentially unchanged. He has sleep apnea, Parkinson's disease at some point labeled his Parkinson's plus, bilateral lymphedema, history of malignant melanoma. He does not have an arterial issue that I am aware of although his previous arterial studies have been noncompressible 7/1; patient we admitted the clinic 2 weeks ago. He has severe bilateral lymphedema. He had wounds predominantly on the left leg but also  superficially on the right. He has compression pumps which he says he is using once a day. 7/15; 2-week follow-up. He comes in for nurse visit. He has severe bilateral lymphedema. He has upper abdominal level compression pumps which she is using twice a day. We have been wrapping both his lower legs the areas on the right leg are healed. He still has an area of left upper lateral and right anterior medial. Anterior medial wound is small. He is tolerating this well. 7/22; still significant lymphedema even with 4-layer compression and external compression pump usage. He said he had some itching on the right anterior leg he comes in with a large superficial area of skin breakdown anteriorly and a smaller area laterally on the right leg. He also has a rash on the left dorsal ankle crease. Some of these look pustular. I wonder if this is an occlusion issue from foam we put down here. He also states the only thing different this week is the did a 5-hour car ride with his legs dependent. There was nothing open on the right leg last week but we wrapped it in any case 7/27; culture of the blisters on his left anterior leg grew staph aureus. I gave him doxycycline which he started yesterday. He is already appear cleaner and dry. His major wound is on the right anterior lower leg and the right lateral lower leg. There is nothing else open on the left leg. He has better edema control today. 03/04/20-Patient comes in with new areas after he had a fall, on his left dorsal foot and right dorsal foot and left medial proximal leg, there is significant amount of drainage into his dressing that was removed today which is actually greenish in color. It looks like he is completed a course of doxycycline for staph that grew from his blisters however he might need antipseudomonal coverage at this point 8/16; apparently the wounds originally were all healed until his last visit when after a fall he had areas on the left  dorsal foot, right dorsal foot and left medial proximal leg. T oday all of these have healed and he has a small area on the left medial ankle. The patient has severe bilateral lymphedema which is managed usually with compression stockings/external compression stockings as well his mid abdomen compression pumps. He is still using the latter twice a day. Objective Constitutional Sitting or standing Blood Pressure is within target range for patient.. Pulse regular and within target range for patient.Marland Kitchen Respirations regular, non-labored and within target range.. Temperature is normal and within the target range for the patient.Marland Kitchen Appears in no distress. Vitals Time Taken: 4:04 PM, Height: 67 in, Weight: 250 lbs, BMI: 39.2, Temperature: 97.9 F, Pulse: 75 bpm, Respiratory Rate: 19 breaths/min, Blood Pressure: 128/76 mmHg. Respiratory work of breathing is normal. Cardiovascular Pedal pulses are not palpable through the lymphedema although his feet are warm  bilaterally. General Notes: Wound exam; dorsal left foot and right foot ulcers are closed left medial upper lower leg is closed. He has a new area on the medial right ankle. His edema control actually is quite good Integumentary (Hair, Skin) Wound #11 status is Healed - Epithelialized. Original cause of wound was Blister. The wound is located on the Left,Medial Lower Leg. The wound measures 0cm length x 0cm width x 0cm depth; 0cm^2 area and 0cm^3 volume. There is no tunneling or undermining noted. There is a none present amount of drainage noted. The wound margin is distinct with the outline attached to the wound base. There is no granulation within the wound bed. There is no necrotic tissue within the wound bed. Wound #12 status is Healed - Epithelialized. Original cause of wound was Blister. The wound is located on the Left,Dorsal Foot. The wound measures 0cm length x 0cm width x 0cm depth; 0cm^2 area and 0cm^3 volume. There is no tunneling or  undermining noted. There is a none present amount of drainage noted. The wound margin is distinct with the outline attached to the wound base. There is no granulation within the wound bed. There is no necrotic tissue within the wound bed. Wound #13 status is Healed - Epithelialized. Original cause of wound was Blister. The wound is located on the Right Foot. The wound measures 0cm length x 0cm width x 0cm depth; 0cm^2 area and 0cm^3 volume. There is no tunneling or undermining noted. There is a none present amount of drainage noted. The wound margin is distinct with the outline attached to the wound base. There is no granulation within the wound bed. There is no necrotic tissue within the wound bed. Wound #14 status is Open. Original cause of wound was Gradually Appeared. The wound is located on the Right,Medial Lower Leg. The wound measures 0.5cm length x 1cm width x 0.1cm depth; 0.393cm^2 area and 0.039cm^3 volume. There is Fat Layer (Subcutaneous Tissue) Exposed exposed. There is no tunneling or undermining noted. There is a medium amount of serous drainage noted. The wound margin is distinct with the outline attached to the wound base. There is large (67-100%) red, pink granulation within the wound bed. There is no necrotic tissue within the wound bed. 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 There was a Four Layer Compression Therapy Procedure by Levan Hurst, RN. Post procedure Diagnosis Wound #14: Same as Pre-Procedure Plan Follow-up Appointments: Return Appointment in 1 week. Dressing Change Frequency: Wound #14 Right,Medial Lower Leg: Do not change entire dressing  for one week. Skin Barriers/Peri-Wound Care: Moisturizing lotion TCA Cream or Ointment - mixed with lotion to both legs liberally Wound Cleansing: May shower with protection. - with cast protectors. Primary Wound Dressing: Wound #14 Right,Medial Lower Leg: Calcium Alginate with Silver Secondary Dressing: Wound #14 Right,Medial Lower Leg: ABD pad Edema Control: 4 layer compression - Right Lower Extremity - unna boot at the foot and top of leg to anchor Avoid standing for long periods of time Elevate legs to the level of the heart or above for 30 minutes daily and/or when sitting, a frequency of: - throughout the day. Exercise regularly Support Garment 30-40 mm/Hg pressure to: - Juxtalite to left leg  daily Segmental Compressive Device. - use lymphedema pumps twice a day an hour each time. Once in the morning and once in the evening. 1. Continue with silver alginate/ABDs under 4-layer compression on the right 2. I did not wrap his left leg as there is nothing open here he can go back to his stocking and compression pumps on the left leg. 3. Also continuing with compression pumps on the right leg. He showed [place] a picture of these and they are actually up to mid abdominal level 4. May be dischargeable next visit Electronic Signature(s) Signed: 03/08/2020 5:53:04 PM By: Linton Ham MD Entered By: Linton Ham on 03/08/2020 17:37:03 -------------------------------------------------------------------------------- SuperBill Details Patient Name: Date of Service: Nicholas [name] DFO RD K. 03/08/2020 Medical Record Number: [id number] Patient Account Number: [id number] Date of Birth/Sex: Treating RN: [birth year] (74 y.o. [name] Primary Care Provider: [name] Other Clinician: Referring Provider: Treating Provider/Extender: [name] in Treatment: 8 Diagnosis Coding ICD-10 Codes Code Description I89.0 Lymphedema, not elsewhere  classified L97.811 Non-pressure chronic ulcer of other part of right lower leg limited to breakdown of skin L97.821 Non-pressure chronic ulcer of other part of left lower leg limited to breakdown of skin Facility Procedures CPT4 Code: [id number] Description: (Facility Use Only) [id number] - APPLY MULTLAY COMPRS LWR RT LEG Modifier: Quantity: 1 Physician Procedures : CPT4 Code Description Modifier [id number] - WC PHYS LEVEL 3 - EST PT ICD-10 Diagnosis Description I89.0 Lymphedema, not elsewhere classified L97.821 Non-pressure chronic ulcer of other part of left lower leg limited to breakdown of skin L97.811  Non-pressure chronic ulcer of other part of right lower leg limited to breakdown of skin Quantity: 1 Electronic Signature(s) Signed: 03/08/2020 5:53:04 PM By: Linton Ham MD Previous Signature: 03/08/2020 5:14:32 PM Version By: Levan Hurst RN, BSN Entered By: Linton Ham on 03/08/2020 17:37:42

## 2020-03-15 ENCOUNTER — Encounter (HOSPITAL_BASED_OUTPATIENT_CLINIC_OR_DEPARTMENT_OTHER): Payer: PPO | Admitting: Internal Medicine

## 2020-03-15 DIAGNOSIS — L97811 Non-pressure chronic ulcer of other part of right lower leg limited to breakdown of skin: Secondary | ICD-10-CM | POA: Diagnosis not present

## 2020-03-15 DIAGNOSIS — I890 Lymphedema, not elsewhere classified: Secondary | ICD-10-CM | POA: Diagnosis not present

## 2020-03-15 DIAGNOSIS — L97821 Non-pressure chronic ulcer of other part of left lower leg limited to breakdown of skin: Secondary | ICD-10-CM | POA: Diagnosis not present

## 2020-03-16 NOTE — Progress Notes (Signed)
Nicholas [name] ([id number]) , Visit Report for 03/15/2020 Arrival Information Details Patient Name: Date of Service: Nicholas [name] DFO RD K. 03/15/2020 2:00 PM Medical Record Number: [id number] Patient Account Number: [id number] Date of Birth/Sex: Treating RN: 02-20-1946 (74 y.o. Nicholas [name] Primary Care Wetzel Meester: Nicholas [name] Other Clinician: Referring Ka Flammer: Treating Bethzy Hauck/Extender: [name] in Treatment: 9 Visit Information History Since Last Visit Added or deleted any medications: No Patient Arrived: Wheel Chair Any new allergies or adverse reactions: No Arrival Time: 14:13 Had a fall or experienced change in No Accompanied By: self activities of daily living that may affect Transfer Assistance: EasyPivot Patient Lift risk of falls: Patient Identification Verified: Yes Signs or symptoms of abuse/neglect since last visito No Secondary Verification Process Completed: Yes Hospitalized since last visit: No Patient Requires Transmission-Based Precautions: No Implantable device outside of the clinic excluding No Patient Has Alerts: Yes cellular tissue based products placed in the center Patient Alerts: ABI: Nicholas [name] 07/2018 since last visit: Has Dressing in Place as Prescribed: Yes Has Compression in Place as Prescribed: Yes Pain Present Now: No Electronic Signature(s) Signed: 03/15/2020 4:06:40 PM By: Kela Millin Entered By: Kela Millin on 03/15/2020 14:13:46 -------------------------------------------------------------------------------- Compression Therapy Details Patient Name: Date of Service: Nicholas [name] DFO RD K. 03/15/2020 2:00 PM Medical Record Number: [id number] Patient Account Number: [id number] Date of Birth/Sex: Treating RN: Jun 23, 1946 (74 y.o. Nicholas [name] Primary Care Dajsha Massaro: Nicholas [name] Other Clinician: Referring Sedra Morfin: Treating Capria Cartaya/Extender: [name] in  Treatment: 9 Compression Therapy Performed for Wound Assessment: Wound #14 Right,Medial Lower Leg Performed By: Clinician Nicholas Hurst, RN Compression Type: Four Layer Post Procedure Diagnosis Same as Pre-procedure Electronic Signature(s) Signed: 03/15/2020 4:21:43 PM By: Nicholas Hurst RN, BSN Entered By: Nicholas [name] on 03/15/2020 14:42:43 -------------------------------------------------------------------------------- Compression Therapy Details Patient Name: Date of Service: Nicholas [name] DFO RD K. 03/15/2020 2:00 PM Medical Record Number: [id number] Patient Account Number: [id number] Date of Birth/Sex: Treating RN: Oct 31, 1945 (74 y.o. Nicholas [name] Primary Care Devrin Monforte: Nicholas [name] Other Clinician: Referring Tylasia Fletchall: Treating Khristin Keleher/Extender: [name] in Treatment: 9 Compression Therapy Performed for Wound Assessment: Wound #15 Right,Proximal,Medial Lower Leg Performed By: Clinician Nicholas Hurst, RN Compression Type: Four Layer Post Procedure Diagnosis Same as Pre-procedure Electronic Signature(s) Signed: 03/15/2020 4:21:43 PM By: Nicholas Hurst RN, BSN Entered By: Nicholas [name] on 03/15/2020 14:42:43 -------------------------------------------------------------------------------- Lower Extremity Assessment Details Patient Name: Date of Service: Nicholas [name] DFO RD K. 03/15/2020 2:00 PM Medical Record Number: [id number] Patient Account Number: [id number] Date of Birth/Sex: Treating RN: 01-04-1946 (74 y.o. Nicholas [name] Primary Care Kaspian Muccio: Nicholas [name] Other Clinician: Referring Tramayne Sebesta: Treating Malorie Bigford/Extender: [name] in Treatment: 9 Edema Assessment Assessed: Nicholas [name]: No] Nicholas [name]: No] Edema: [Left: Ye] [Right: s] Calf Left: Right: Point of Measurement: 40 cm From Medial Instep cm 40 cm Ankle Left: Right: Point of Measurement: 19 cm From Medial Instep cm 37 cm Vascular  Assessment Pulses: Dorsalis Pedis Palpable: [Right:No] Electronic Signature(s) Signed: 03/15/2020 4:06:40 PM By: Kela Millin Entered By: Kela Millin on 03/15/2020 14:17:49 -------------------------------------------------------------------------------- Multi Wound Chart Details Patient Name: Date of Service: Nicholas [name] DFO RD K. 03/15/2020 2:00 PM Medical Record Number: [id number] Patient Account Number: [id number] Date of Birth/Sex: Treating RN: 08/11/1945 (74 y.o. Nicholas [name] Primary Care Ranger Petrich: Nicholas [name] Other Clinician: Referring Eustacia Urbanek: Treating Cimberly Stoffel/Extender: [name] in Treatment: 9 Vital Signs Height(in): 67 Pulse(bpm): 77 Weight(lbs): 250  Blood Pressure(mmHg): 128/71 Body Mass Index(BMI): 39 Temperature(F): 97.9 Respiratory Rate(breaths/min): 19 Photos: [14:No Photos Right, Medial Lower Leg] [15:No Photos Right, Proximal, Medial Lower Leg] [N/A:N/A N/A] Wound Location: [14:Gradually Appeared] [15:Gradually Appeared] [N/A:N/A] Wounding Event: [14:Lymphedema] [15:Lymphedema] [N/A:N/A] Primary Etiology: [14:Lymphedema, Sleep Apnea,] [15:Lymphedema, Sleep Apnea,] [N/A:N/A] Comorbid History: [14:Congestive Heart Failure, Hypertension, Peripheral Venous Disease, Received Chemotherapy 03/08/2020] [15:Congestive Heart Failure, Hypertension, Peripheral Venous Disease, Received Chemotherapy 03/15/2020] [N/A:N/A] Date Acquired: [14:1] [15:0] [N/A:N/A] Weeks of Treatment: [14:Open] [15:Open] [N/A:N/A] Wound Status: [14:0.2x0.2x0.1] [15:3.5x4.2x0.1] [N/A:N/A] Measurements L x W x D (cm) [14:0.031] [15:11.545] [N/A:N/A] A (cm) : rea [14:0.003] [15:1.155] [N/A:N/A] Volume (cm) : [14:92.10%] [15:N/A] [N/A:N/A] % Reduction in Area: [14:92.30%] [15:N/A] [N/A:N/A] % Reduction in Volume: [14:Full Thickness Without Exposed] [15:Full Thickness Without Exposed] [N/A:N/A] Classification: [14:Support Structures Medium]  [15:Support Structures Large] [N/A:N/A] Exudate Amount: [14:Serous] [15:Purulent] [N/A:N/A] Exudate Type: [14:amber] [15:yellow, brown, green] [N/A:N/A] Exudate Color: [14:Distinct, outline attached] [15:Distinct, outline attached] [N/A:N/A] Wound Margin: [14:Large (67-100%)] [15:Large (67-100%)] [N/A:N/A] Granulation Amount: [14:Red, Pink] [15:Red, Pink] [N/A:N/A] Granulation Quality: [14:None Present (0%)] [15:None Present (0%)] [N/A:N/A] Necrotic Amount: [14:Fat Layer (Subcutaneous Tissue): Yes Fat Layer (Subcutaneous Tissue): Yes N/A] Exposed Structures: [14:Fascia: No Tendon: No Muscle: No Joint: No Bone: No Large (67-100%)] [15:Fascia: No Tendon: No Muscle: No Joint: No Bone: No None] [N/A:N/A] Epithelialization: [14:Compression Therapy] [15:Compression Therapy] [N/A:N/A] Treatment Notes Electronic Signature(s) Signed: 03/15/2020 4:21:43 PM By: Nicholas Hurst RN, BSN Signed: 03/15/2020 4:33:08 PM By: Linton Ham MD Entered By: Linton Ham on 03/15/2020 14:56:28 -------------------------------------------------------------------------------- Multi-Disciplinary Care Plan Details Patient Name: Date of Service: Nicholas [name] DFO RD K. 03/15/2020 2:00 PM Medical Record Number: [id number] Patient Account Number: [id number] Date of Birth/Sex: Treating RN: December 27, 1945 (74 y.o. Nicholas [name] Primary Care Kayley Zeiders: Other Clinician: Garret [name] Referring Rachyl Wuebker: Treating Analese Sovine/Extender: [name] in Treatment: 9 Active Inactive Venous Leg Ulcer Nursing Diagnoses: Potential for venous Insuffiency (use before diagnosis confirmed) Goals: Patient will maintain optimal edema control Date Initiated: 01/08/2020 Target Resolution Date: 04/02/2020 Goal Status: Active Patient/caregiver will verbalize understanding of disease process and disease management Date Initiated: 01/08/2020 Date Inactivated: 02/05/2020 Target Resolution Date: 01/30/2020 Goal  Status: Met Interventions: Assess peripheral edema status every visit. Notes: Wound/Skin Impairment Nursing Diagnoses: Knowledge deficit related to ulceration/compromised skin integrity Goals: Patient/caregiver will verbalize understanding of skin care regimen Date Initiated: 01/08/2020 Target Resolution Date: 04/02/2020 Goal Status: Active Ulcer/skin breakdown will have a volume reduction of 30% by week 4 Date Initiated: 01/08/2020 Date Inactivated: 02/05/2020 Target Resolution Date: 01/30/2020 Goal Status: Met Interventions: Assess patient/caregiver ability to obtain necessary supplies Assess patient/caregiver ability to perform ulcer/skin care regimen upon admission and as needed Provide education on ulcer and skin care Treatment Activities: Skin care regimen initiated : 01/08/2020 Topical wound management initiated : 01/08/2020 Notes: Electronic Signature(s) Signed: 03/15/2020 4:21:43 PM By: Nicholas Hurst RN, BSN Entered By: Nicholas [name] on 03/15/2020 14:37:37 -------------------------------------------------------------------------------- Pain Assessment Details Patient Name: Date of Service: Nicholas [name] DFO RD K. 03/15/2020 2:00 PM Medical Record Number: [id number] Patient Account Number: [id number] Date of Birth/Sex: Treating RN: [birth year] (74 y.o. Nicholas [name] Primary Care Yuette Putnam: Nicholas [name] Other Clinician: Referring Geniva Lohnes: Treating Boe Deans/Extender: [name] in Treatment: 9 Active Problems Location of Pain Severity and Description of Pain Patient Has Paino No Patient Has Paino No Site Locations Pain Management and Medication Current Pain Management: Electronic Signature(s) Signed: 03/15/2020 4:06:40 PM By: Kela Millin Entered By: Kela Millin on 03/15/2020 14:14:19 -------------------------------------------------------------------------------- Patient/Caregiver Education Details Patient  Name: Date of  Service: Nicholas [name] DFO RD K. 8/23/2021andnbsp2:00 PM Medical Record Number: [id number] Patient Account Number: [id number] Date of Birth/Gender: Treating RN: May 22, 1946 (74 y.o. Nicholas [name] Primary Care Physician: Nicholas [name] Other Clinician: Referring Physician: Treating Physician/Extender: [name] in Treatment: 9 Education Assessment Education Provided To: Patient Education Topics Provided Wound/Skin Impairment: Methods: Explain/Verbal Responses: State content correctly [employer]) Signed: 03/15/2020 4:21:43 PM By: Nicholas Hurst RN, BSN Entered By: Nicholas [name] on 03/15/2020 14:37:48 -------------------------------------------------------------------------------- Wound Assessment Details Patient Name: Date of Service: Nicholas [name] DFO RD K. 03/15/2020 2:00 PM Medical Record Number: [id number] Patient Account Number: [id number] Date of Birth/Sex: Treating RN: 07-13-1946 (74 y.o. Nicholas [name] Primary Care Dawn Kiper: Nicholas [name] Other Clinician: Referring Kayleigh Broadwell: Treating Lilibeth Opie/Extender: [name] in Treatment: 9 Wound Status Wound Number: 14 Primary Lymphedema Etiology: Wound Location: Right, Medial Lower Leg Wound Open Wounding Event: Gradually Appeared Status: Date Acquired: 03/08/2020 Comorbid Lymphedema, Sleep Apnea, Congestive Heart Failure, Weeks Of Treatment: 1 History: Hypertension, Peripheral Venous Disease, Received Chemotherapy Clustered Wound: No Wound Measurements Length: (cm) 0.2 Width: (cm) 0.2 Depth: (cm) 0.1 Area: (cm) 0.031 Volume: (cm) 0.003 % Reduction in Area: 92.1% % Reduction in Volume: 92.3% Epithelialization: Large (67-100%) Tunneling: No Undermining: No Wound Description Classification: Full Thickness Without Exposed Support Structures Wound Margin: Distinct, outline attached Exudate Amount: Medium Exudate Type: Serous Exudate Color:  amber Foul Odor After Cleansing: No Slough/Fibrino No Wound Bed Granulation Amount: Large (67-100%) Exposed Structure Granulation Quality: Red, Pink Fascia Exposed: No Necrotic Amount: None Present (0%) Fat Layer (Subcutaneous Tissue) Exposed: Yes Tendon Exposed: No Muscle Exposed: No Joint Exposed: No Bone Exposed: No Electronic Signature(s) Signed: 03/15/2020 4:06:40 PM By: Kela Millin Entered By: Kela Millin on 03/15/2020 14:20:59 -------------------------------------------------------------------------------- Wound Assessment Details Patient Name: Date of Service: Nicholas [name] DFO RD K. 03/15/2020 2:00 PM Medical Record Number: [id number] Patient Account Number: [id number] Date of Birth/Sex: Treating RN: [birth year] (74 y.o. Nicholas [name] Primary Care Henrine Hayter: Nicholas [name] Other Clinician: Referring Deysi Soldo: Treating Oneal Schoenberger/Extender: [name] in Treatment: 9 Wound Status Wound Number: 15 Primary Lymphedema Etiology: Wound Location: Right, Proximal, Medial Lower Leg Wound Open Wounding Event: Gradually Appeared Status: Date Acquired: 03/15/2020 Comorbid Lymphedema, Sleep Apnea, Congestive Heart Failure, Weeks Of Treatment: 0 History: Hypertension, Peripheral Venous Disease, Received Chemotherapy Clustered Wound: No Wound Measurements Length: (cm) 3.5 Width: (cm) 4.2 Depth: (cm) 0.1 Area: (cm) 11.545 Volume: (cm) 1.155 % Reduction in Area: % Reduction in Volume: Epithelialization: None Tunneling: No Undermining: No Wound Description Classification: Full Thickness Without Exposed Support Structures Wound Margin: Distinct, outline attached Exudate Amount: Large Exudate Type: Purulent Exudate Color: yellow, brown, green Foul Odor After Cleansing: No Slough/Fibrino No Wound Bed Granulation Amount: Large (67-100%) Exposed Structure Granulation Quality: Red, Pink Fascia Exposed: No Necrotic Amount: None  Present (0%) Fat Layer (Subcutaneous Tissue) Exposed: Yes Tendon Exposed: No Muscle Exposed: No Joint Exposed: No Bone Exposed: No Electronic Signature(s) Signed: 03/15/2020 4:06:40 PM By: Kela Millin Entered By: Kela Millin on 03/15/2020 14:20:22 -------------------------------------------------------------------------------- Vitals Details Patient Name: Date of Service: Nicholas [name] DFO RD K. 03/15/2020 2:00 PM Medical Record Number: [id number] Patient Account Number: [id number] Date of Birth/Sex: Treating RN: 1946-03-01 (74 y.o. Nicholas [name] Primary Care Talayia Hjort: Nicholas [name] Other Clinician: Referring Eliane Hammersmith: Treating Oriyah Lamphear/Extender: [name] in Treatment: 9 Vital Signs Time Taken: 14:10 Temperature (F): 97.9 Height (in): 67 Pulse (bpm): 77 Weight (  lbs): 250 Respiratory Rate (breaths/min): 19 Body Mass Index (BMI): 39.2 Blood Pressure (mmHg): 128/71 Reference Range: 80 - 120 mg / dl Electronic Signature(s) Signed: 03/15/2020 4:06:40 PM By: Kela Millin Entered By: Kela Millin on 03/15/2020 14:14:10

## 2020-03-16 NOTE — Progress Notes (Signed)
CASSELL VOORHIES ([id number]) , Visit Report for 03/15/2020 HPI Details Patient Name: Date of Service: Nicholas [name] DFO RD K. 03/15/2020 2:00 PM Medical Record Number: [id number] Patient Account Number: [id number] Date of Birth/Sex: Treating RN: [birth year] (74 y.o. [name] Primary Care Provider: [name] Other Clinician: Referring Provider: Treating Provider/Extender: [name] in Treatment: 9 History of Present Illness HPI Description: ADMISSION 07/04/2018 This is a 74 year old man who has bilateral lymphedema felt to be secondary to previous treatment for malignant melanoma on his left toes. 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 Malignant melanoma was treated with immunotherapy. He is not had radiation. Sometime after this he developed edema in the left leg that spread into the right leg. He has bilateral compression pumps at home and he uses them twice a day and claims to be quite compliant. He also has a juxta lite type stocking at home which is apparently 39-year-old. He states that he fell in early October dislocating and fracturing his left shoulder. He slept in a recliner with his legs dependent. He was seen and followed at the rehab clinic at [facility] and was having lymphedema wraps done by 1 of the PT who noted his open wounds last week and he has been referred here for management. The patient has 2 open wounds on the right s distal lower leg and one on the left anterior lower leg The patient is not a diabetic. 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 For one reason or another we could not get him set up with home health and he kept the wraps on all week which he generally seem to tolerate. He did not use his compression pumps over the top of the wraps which I told him he could do this week. He comes in with a new rash on both feet which looks like tinea 07/22/2018; much better edema control bilaterally. The wounds on the right lateral calf and left anterior lower leg both look a lot better. There is epithelialization over both surfaces but I think this is fragile and I am going to put him in our compression wraps for another week. He says he is been using his compression pumps at home. He has a constellation of lower extremity compression garments that he is going to bring in next week. In discussion with him I cannot really get a sense of everything he has. I think he was at one point prescribed 30-40 below-knee stockings but he points out he could not get them on. 07/29/2018; the patient arrives in his legs are healed. He has a constellation of stockings none of which I think are going to be that helpful except for the fact that he has external wraparound stockings of some description. He has above-knee stockings but I cannot imagine it would be possible to get these on READMISSION 01/08/2020 Patient is now a 74 year old man. We had him for a short period from December 2019 through January 2020. He has bilateral lower extremity lymphedema which he initially traces to treatment for malignant melanoma 7 years ago. He has been followed at the lymphedema clinic on  [place] and recently has compression pumps that go up to his mid abdomen. He recently developed superficial wounds bilaterally. They will not manage these in the lymphedema clinic where the services are run by therapy. He has not been using his compression pumps. His significant other cannot get the stockings on his legs and the patient is only limited ability to help because  of Parkinson's disease Past medical history is essentially unchanged. He has sleep apnea, Parkinson's disease at some point labeled his Parkinson's plus, bilateral lymphedema, history of malignant melanoma. He does not have an arterial issue that I am aware of although his previous arterial studies have been noncompressible 7/1; patient we admitted the clinic 2 weeks ago. He has severe bilateral lymphedema. He had wounds predominantly on the left leg but also superficially on the right. He has compression pumps which he says he is using once a day. 7/15; 2-week follow-up. He comes in for nurse visit. He has severe bilateral lymphedema. He has upper abdominal level compression pumps which she is using twice a day. We have been wrapping both his lower legs the areas on the right leg are healed. He still has an area of left upper lateral and right anterior medial. Anterior medial wound is small. He is tolerating this well. 7/22; still significant lymphedema even with 4-layer compression and external compression pump usage. He said he had some itching on the right anterior leg he comes in with a large superficial area of skin breakdown anteriorly and a smaller area laterally on the right leg. He also has a rash on the left dorsal ankle crease. Some of these look pustular. I wonder if this is an occlusion issue from foam we put down here. He also states the only thing different this week is the did a 5-hour car ride with his legs dependent. There was nothing open on the right leg last week but we wrapped it in any case 7/27; culture of the blisters on his left anterior leg grew staph aureus. I gave him doxycycline which he started yesterday. He is already appear cleaner and dry. His major wound is on the right anterior lower leg and the right lateral lower leg. There is nothing else open on the left leg. He has better edema control today. 03/04/20-Patient comes in with new areas after he had a fall, on his  left dorsal foot and right dorsal foot and left medial proximal leg, there is significant amount of drainage into his dressing that was removed today which is actually greenish in color. It looks like he is completed a course of doxycycline for staph that grew from his blisters however he might need antipseudomonal coverage at this point 8/16; apparently the wounds originally were all healed until his last visit when after a fall he had areas on the left dorsal foot, right dorsal foot and left medial proximal leg. T oday all of these have healed and he has a small area on the left medial ankle. The patient has severe bilateral lymphedema which is managed usually with compression stockings/external compression stockings as well his mid abdomen compression pumps. He is still using the latter twice a day. 8/23; the wounds on his dorsal feet and medial ankle that he had from a fall last time of closed however he has a new wrap injury on the left medial upper leg and a smaller 1 anteriorly. He is using his compression pumps twice a day Electronic Signature(s) Signed: 03/15/2020 4:33:08 PM By: Linton Ham  MD Entered By: Linton Ham on 03/15/2020 14:57:17 -------------------------------------------------------------------------------- Physical Exam Details Patient Name: Date of Service: Nicholas [name] DFO RD K. 03/15/2020 2:00 PM Medical Record Number: [id number] Patient Account Number: [id number] Date of Birth/Sex: Treating RN: 1946-06-05 (74 y.o. [name] Primary Care Provider: [name] Other Clinician: Referring Provider: Treating Provider/Extender: [name] in Treatment: 9 Constitutional Sitting or standing Blood Pressure is within target range for patient.. Pulse regular and within target range for patient.Marland Kitchen Respirations regular, non-labored and within target range.. Temperature is normal and within the target range for the patient.Marland Kitchen Appears in no  distress. Cardiovascular I cannot feel his pulses but his foot is warm. Severe bilateral lymphedema. Nonpitting.. Notes Wound exam; the areas on his left and right dorsal foot are closed. Medial right ankle also closed. He has a large but superficial wound at the top of his wrap some erythema around this which I think is simply friction. He has another small wound anteriorly. There is no evidence of a cellulitis or DVT Electronic Signature(s) Signed: 03/15/2020 4:33:08 PM By: Linton Ham MD Entered By: Linton Ham on 03/15/2020 15:02:35 -------------------------------------------------------------------------------- Physician Orders Details Patient Name: Date of Service: Nicholas [name] DFO RD K. 03/15/2020 2:00 PM Medical Record Number: [id number] Patient Account Number: [id number] Date of Birth/Sex: Treating RN: January 15, 1946 (74 y.o. [name] Primary Care Provider: [name] Other Clinician: Referring Provider: Treating Provider/Extender: [name] in Treatment: 9 Verbal / Phone Orders: No Diagnosis Coding ICD-10 Coding Code Description I89.0 Lymphedema, not elsewhere classified L97.811 Non-pressure chronic ulcer of other part of right lower leg limited to breakdown of skin L97.821 Non-pressure chronic ulcer of other part of left lower leg limited to breakdown of skin Follow-up Appointments Return Appointment in 1 week. Dressing Change Frequency Wound #14 Right,Medial Lower Leg Do not change entire dressing for one week. Skin Barriers/Peri-Wound Care Moisturizing lotion TCA Cream or Ointment - mixed with lotion to both legs liberally Wound Cleansing May shower with protection. - with cast protectors. Primary Wound Dressing Wound #14 Right,Medial Lower Leg Calcium Alginate with Silver Secondary Dressing Wound #14 Right,Medial Lower Leg ABD pad Edema Control 4 layer compression - Right Lower Extremity - unna boot at the foot and  top of leg to anchor Avoid standing for long periods of time Elevate legs to the level of the heart or above for 30 minutes daily and/or when sitting, a frequency of: - throughout the day. Exercise regularly Support Garment 30-40 mm/Hg pressure to: - Farrow wrap to left leg daily Segmental Compressive Device. - use lymphedema pumps twice a day an hour each time. Once in the morning and once in the evening. Electronic Signature(s) Signed: 03/15/2020 4:21:43 PM By: Levan Hurst RN, BSN Signed: 03/15/2020 4:33:08 PM By: Linton Ham MD Entered By: Levan Hurst on 03/15/2020 14:41:03 -------------------------------------------------------------------------------- Problem List Details Patient Name: Date of Service: Nicholas [name] DFO RD K. 03/15/2020 2:00 PM Medical Record Number: [id number] Patient Account Number: [id number] Date of Birth/Sex: Treating RN: 02-17-1946 (74 y.o. [name] Primary Care Provider: [name] Other Clinician: Referring Provider: Treating Provider/Extender: [name] in Treatment: 9 Active Problems ICD-10 Encounter Code Description Active Date MDM Diagnosis I89.0 Lymphedema, not elsewhere classified 01/08/2020 No Yes L97.811 Non-pressure chronic ulcer of other part of right lower leg limited to breakdown 01/08/2020 No Yes of skin L97.821 Non-pressure chronic ulcer of other part of left lower leg limited to breakdown 01/08/2020 No  Yes of skin Inactive Problems Resolved Problems Electronic Signature(s) Signed: 03/15/2020 4:33:08 PM By: Linton Ham MD Entered By: Linton Ham on 03/15/2020 14:56:15 -------------------------------------------------------------------------------- Progress Note Details Patient Name: Date of Service: Nicholas [name] DFO RD K. 03/15/2020 2:00 PM Medical Record Number: [id number] Patient Account Number: [id number] Date of Birth/Sex: Treating RN: 1946-02-28 (74 y.o. [name] Primary Care Provider: [name] Other Clinician: Referring Provider: Treating Provider/Extender: [name] in Treatment: 9 Subjective History of Present Illness (HPI) ADMISSION 07/04/2018 This is a 74 year old man who has bilateral lymphedema felt to be secondary to previous treatment for malignant melanoma on his left toes. 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 Malignant melanoma was treated with immunotherapy. He is not had radiation. Sometime after this he developed edema in the left leg that spread into the right leg. He has bilateral compression pumps at home and he uses them twice a day and claims to be quite compliant. He also has a juxta lite type stocking at home which is apparently 66-year-old. He states that he fell in early October dislocating and fracturing his left shoulder. He slept in a recliner with his legs dependent. He was seen and followed at the rehab clinic at [facility] and was having lymphedema wraps done by 1 of the PT who noted his open wounds last week and he has been referred here for management. The patient has 2 open wounds on the right s distal lower leg and one on the left anterior lower leg The patient is not a diabetic. 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 For one reason or another we could not get him set up with home health and he kept the wraps on all week which he generally seem to tolerate. He did not use his compression pumps over the top of the wraps which I told him he could do this week. He comes in with a new  rash on both feet which looks like tinea 07/22/2018; much better edema control bilaterally. The wounds on the right lateral calf and left anterior lower leg both look a lot better. There is epithelialization over both surfaces but I think this is fragile and I am going to put him in our compression wraps for another week. He says he is been using his compression pumps at home. ooHe has a constellation of lower extremity compression garments that he is going to bring in next week. In discussion with him I cannot really get a sense of everything he has. I think he was at one point prescribed 30-40 below-knee stockings but he points out he could not get them on. 07/29/2018; the patient arrives in his legs are healed. He has a constellation of stockings none of which I think are going to be that helpful except for the fact that he has external wraparound stockings of some description. He has above-knee stockings but I cannot imagine it would be possible to get these on READMISSION 01/08/2020 Patient is now a 74 year old man. We had him for a short period from December 2019 through January 2020. He has bilateral lower extremity lymphedema which he initially  traces to treatment for malignant melanoma 7 years ago. He has been followed at the lymphedema clinic on [facility] and recently has compression pumps that go up to his mid abdomen. He recently developed superficial wounds bilaterally. They will not manage these in the lymphedema clinic where the services are run by therapy. He has not been using his compression pumps. His significant other cannot get the stockings on his legs and the patient is only limited ability to help because of Parkinson's disease Past medical history is essentially unchanged. He has sleep apnea, Parkinson's disease at some point labeled his Parkinson's plus, bilateral lymphedema, history of malignant melanoma. He does not have an arterial issue that I am aware of although his  previous arterial studies have been noncompressible 7/1; patient we admitted the clinic 2 weeks ago. He has severe bilateral lymphedema. He had wounds predominantly on the left leg but also superficially on the right. He has compression pumps which he says he is using once a day. 7/15; 2-week follow-up. He comes in for nurse visit. He has severe bilateral lymphedema. He has upper abdominal level compression pumps which she is using twice a day. We have been wrapping both his lower legs the areas on the right leg are healed. He still has an area of left upper lateral and right anterior medial. Anterior medial wound is small. He is tolerating this well. 7/22; still significant lymphedema even with 4-layer compression and external compression pump usage. He said he had some itching on the right anterior leg he comes in with a large superficial area of skin breakdown anteriorly and a smaller area laterally on the right leg. He also has a rash on the left dorsal ankle crease. Some of these look pustular. I wonder if this is an occlusion issue from foam we put down here. He also states the only thing different this week is the did a 5-hour car ride with his legs dependent. There was nothing open on the right leg last week but we wrapped it in any case 7/27; culture of the blisters on his left anterior leg grew staph aureus. I gave him doxycycline which he started yesterday. He is already appear cleaner and dry. His major wound is on the right anterior lower leg and the right lateral lower leg. There is nothing else open on the left leg. He has better edema control today. 03/04/20-Patient comes in with new areas after he had a fall, on his left dorsal foot and right dorsal foot and left medial proximal leg, there is significant amount of drainage into his dressing that was removed today which is actually greenish in color. It looks like he is completed a course of doxycycline for staph that grew from his  blisters however he might need antipseudomonal coverage at this point 8/16; apparently the wounds originally were all healed until his last visit when after a fall he had areas on the left dorsal foot, right dorsal foot and left medial proximal leg. T oday all of these have healed and he has a small area on the left medial ankle. The patient has severe bilateral lymphedema which is managed usually with compression stockings/external compression stockings as well his mid abdomen compression pumps. He is still using the latter twice a day. 8/23; the wounds on his dorsal feet and medial ankle that he had from a fall last time of closed however he has a new wrap injury on the left medial upper leg and a smaller 1 anteriorly.  He is using his compression pumps twice a day Objective Constitutional Sitting or standing Blood Pressure is within target range for patient.. Pulse regular and within target range for patient.Marland Kitchen Respirations regular, non-labored and within target range.. Temperature is normal and within the target range for the patient.Marland Kitchen Appears in no distress. Vitals Time Taken: 2:10 PM, Height: 67 in, Weight: 250 lbs, BMI: 39.2, Temperature: 97.9 F, Pulse: 77 bpm, Respiratory Rate: 19 breaths/min, Blood Pressure: 128/71 mmHg. Cardiovascular I cannot feel his pulses but his foot is warm. Severe bilateral lymphedema. Nonpitting.. General Notes: Wound exam; the areas on his left and right dorsal foot are closed. Medial right ankle also closed. He has a large but superficial wound at the top of his wrap some erythema around this which I think is simply friction. He has another small wound anteriorly. There is no evidence of a cellulitis or DVT Integumentary (Hair, Skin) Wound #14 status is Open. Original cause of wound was Gradually Appeared. The wound is located on the Right,Medial Lower Leg. The wound measures 0.2cm length x 0.2cm width x 0.1cm depth; 0.031cm^2 area and 0.003cm^3 volume. There is  Fat Layer (Subcutaneous Tissue) exposed. There is no tunneling or undermining noted. There is a medium amount of serous drainage noted. The wound margin is distinct with the outline attached to the wound base. There is large (67-100%) red, pink granulation within the wound bed. There is no necrotic tissue within the wound bed. Wound #15 status is Open. Original cause of wound was Gradually Appeared. The wound is located on the Right,Proximal,Medial Lower Leg. The wound measures 3.5cm length x 4.2cm width x 0.1cm depth; 11.545cm^2 area and 1.155cm^3 volume. There is Fat Layer (Subcutaneous Tissue) exposed. There is no tunneling or undermining noted. There is a large amount of purulent drainage noted. The wound margin is distinct with the outline attached to the wound base. There is large (67-100%) red, pink granulation within the wound bed. There is no necrotic tissue within the wound bed. 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 There was a Four Layer Compression Therapy Procedure by Levan Hurst, RN. Post procedure Diagnosis Wound #14: Same as Pre-Procedure Wound #15 Pre-procedure diagnosis of Wound #15 is a Lymphedema located on the Right,Proximal,Medial Lower Leg . There was a Four Layer Compression Therapy Procedure by Levan Hurst, RN. Post procedure Diagnosis Wound #15: Same as Pre-Procedure Plan Follow-up Appointments: Return Appointment in 1 week. Dressing Change Frequency: Wound #14 Right,Medial Lower Leg: Do not change entire dressing for one week. Skin Barriers/Peri-Wound Care: Moisturizing lotion TCA Cream or Ointment - mixed with lotion to both legs  liberally Wound Cleansing: May shower with protection. - with cast protectors. Primary Wound Dressing: Wound #14 Right,Medial Lower Leg: Calcium Alginate with Silver Secondary Dressing: Wound #14 Right,Medial Lower Leg: ABD pad Edema Control: 4 layer compression - Right Lower Extremity - unna boot at the foot and top of leg to anchor Avoid standing for long periods of time Elevate legs to the level of the heart or above for 30 minutes daily and/or when sitting, a frequency of: - throughout the day. Exercise regularly Support Garment 30-40 mm/Hg pressure to: - Farrow wrap to left leg daily Segmental Compressive Device. - use  lymphedema pumps twice a day an hour each time. Once in the morning and once in the evening. #1 to the new wounds on the right leg anteriorly and medially I am using silver alginate still under 4-layer compression will pad this area carefully and avoid any friction with the wraps 2. He is using his compression pumps for an hour twice a day these are abdominal level pumps. He may need to go to 3 times a day ultimately. 3. Follow-up in 1 week Electronic Signature(s) Signed: 03/15/2020 4:33:08 PM By: Linton Ham MD Entered By: Linton Ham on 03/15/2020 15:03:33 -------------------------------------------------------------------------------- SuperBill Details Patient Name: Date of Service: Nicholas [name] DFO RD K. 03/15/2020 Medical Record Number: [id number] Patient Account Number: [id number] Date of Birth/Sex: Treating RN: 1946-02-22 (74 y.o. [name] Primary Care Provider: [name] Other Clinician: Referring Provider: Treating Provider/Extender: [name] in Treatment: 9 Diagnosis Coding ICD-10 Codes Code Description I89.0 Lymphedema, not elsewhere classified L97.811 Non-pressure chronic ulcer of other part of right lower leg limited to breakdown of skin L97.821 Non-pressure chronic ulcer of other part of left  lower leg limited to breakdown of skin Facility Procedures CPT4 Code: [id number] Description: (Facility Use Only) [phone] - [place] [facility] LEG Modifier: Quantity: 1 Physician Procedures : CPT4 Code Description Modifier [id number] - WC PHYS LEVEL 3 - EST PT ICD-10 Diagnosis Description L97.811 Non-pressure chronic ulcer of other part of right lower leg limited to breakdown of skin I89.0 Lymphedema, not elsewhere classified Quantity: 1 Electronic Signature(s) Signed: 03/15/2020 4:21:43 PM By: Levan Hurst RN, BSN Signed: 03/15/2020 4:33:08 PM By: Linton Ham MD Entered By: Levan Hurst on 03/15/2020 15:42:48

## 2020-03-22 ENCOUNTER — Other Ambulatory Visit: Payer: Self-pay

## 2020-03-22 ENCOUNTER — Encounter (HOSPITAL_BASED_OUTPATIENT_CLINIC_OR_DEPARTMENT_OTHER): Payer: PPO | Admitting: Internal Medicine

## 2020-03-22 DIAGNOSIS — I890 Lymphedema, not elsewhere classified: Secondary | ICD-10-CM | POA: Diagnosis not present

## 2020-03-22 DIAGNOSIS — L97811 Non-pressure chronic ulcer of other part of right lower leg limited to breakdown of skin: Secondary | ICD-10-CM | POA: Diagnosis not present

## 2020-03-22 NOTE — Progress Notes (Signed)
ALASTOR KNEALE ([id number]) , Visit Report for 03/22/2020 HPI Details Patient Name: Date of Service: [name] 03/22/2020 2:00 PM Medical Record Number: [id number] Patient Account Number: [id number] Date of Birth/Sex: Treating RN: [birth year] (74 y.o. [name] Primary Care Provider: [name] Other Clinician: Referring Provider: Treating Provider/Extender: [name] in Treatment: 10 History of Present Illness HPI Description: ADMISSION 07/04/2018 This is a 74 year old man who has bilateral lymphedema felt to be secondary to previous treatment for malignant melanoma on his left toes. 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 Malignant melanoma was treated with immunotherapy. He is not had radiation. Sometime after this he developed edema in the left leg that spread into the right leg. He has bilateral compression pumps at home and he uses them twice a day and claims to be quite compliant. He also has a juxta lite type stocking at home which is apparently 8-year-old. He states that he fell in early October dislocating and fracturing his left shoulder. He slept in a recliner with his legs dependent. He was seen and followed at the rehab clinic at [facility] and was having lymphedema wraps done by 1 of the PT who noted his open wounds last week and he has been referred here for management. The patient has 2 open wounds on the right s distal lower leg and one on the left anterior lower leg The patient is not a diabetic. 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 For one reason or another we could not get him set up with home health and he kept the wraps on all week which he generally seem to tolerate. He did not use his compression pumps over the top of the wraps which I told him he could do this week. He comes in with a new rash on both feet which looks like tinea 07/22/2018; much better edema control bilaterally. The wounds on the right lateral calf and left anterior lower leg both look a lot better. There is epithelialization over both surfaces but I think this is fragile and I am going to put him in our compression wraps for another week. He says he is been using his compression pumps at home. He has a constellation of lower extremity compression garments that he is going to bring in next week. In discussion with him I cannot really get a sense of everything he has. I think he was at one point prescribed 30-40 below-knee stockings but he points out he could not get them on. 07/29/2018; the patient arrives in his legs are healed. He has a constellation of stockings none of which I think are going to be that helpful except for the fact that he has external wraparound stockings of some description. He has above-knee stockings but I cannot imagine it would be possible to get these on READMISSION 01/08/2020 Patient is now a 74 year old man. We had him for a short period from December 2019 through January 2020. He has bilateral lower extremity lymphedema which he initially traces to treatment for malignant melanoma 7 years ago. He has been followed at the lymphedema clinic on  [address] and recently has compression pumps that go up to his mid abdomen. He recently developed superficial wounds bilaterally. They will not manage these in the lymphedema clinic where the services are run by therapy. He has not been using his compression pumps. His significant other cannot get the stockings on his legs and the patient is only limited ability to help because  of Parkinson's disease Past medical history is essentially unchanged. He has sleep apnea, Parkinson's disease at some point labeled his Parkinson's plus, bilateral lymphedema, history of malignant melanoma. He does not have an arterial issue that I am aware of although his previous arterial studies have been noncompressible 7/1; patient we admitted the clinic 2 weeks ago. He has severe bilateral lymphedema. He had wounds predominantly on the left leg but also superficially on the right. He has compression pumps which he says he is using once a day. 7/15; 2-week follow-up. He comes in for nurse visit. He has severe bilateral lymphedema. He has upper abdominal level compression pumps which she is using twice a day. We have been wrapping both his lower legs the areas on the right leg are healed. He still has an area of left upper lateral and right anterior medial. Anterior medial wound is small. He is tolerating this well. 7/22; still significant lymphedema even with 4-layer compression and external compression pump usage. He said he had some itching on the right anterior leg he comes in with a large superficial area of skin breakdown anteriorly and a smaller area laterally on the right leg. He also has a rash on the left dorsal ankle crease. Some of these look pustular. I wonder if this is an occlusion issue from foam we put down here. He also states the only thing different this week is the did a 5-hour car ride with his legs dependent. There was nothing open on the right leg last week but we wrapped it in any case 7/27; culture of the blisters on his left anterior leg grew staph aureus. I gave him doxycycline which he started yesterday. He is already appear cleaner and dry. His major wound is on the right anterior lower leg and the right lateral lower leg. There is nothing else open on the left leg. He has better edema control today. 03/04/20-Patient comes in with new areas after he had a fall, on his  left dorsal foot and right dorsal foot and left medial proximal leg, there is significant amount of drainage into his dressing that was removed today which is actually greenish in color. It looks like he is completed a course of doxycycline for staph that grew from his blisters however he might need antipseudomonal coverage at this point 8/16; apparently the wounds originally were all healed until his last visit when after a fall he had areas on the left dorsal foot, right dorsal foot and left medial proximal leg. T oday all of these have healed and he has a small area on the left medial ankle. The patient has severe bilateral lymphedema which is managed usually with compression stockings/external compression stockings as well his mid abdomen compression pumps. He is still using the latter twice a day. 8/23; the wounds on his dorsal feet and medial ankle that he had from a fall last time of closed however he has a new wrap injury on the left medial upper leg and a smaller 1 anteriorly. He is using his compression pumps twice a day 8/30; nothing open on the left leg. He is  in his stocking. Using his compression pumps twice a day. From last week still a small opening in the medial upper leg on the right he has a new area in the upper mid tibia. He thinks this might have been a wrap injury looks more like the remanent of the blister Electronic Signature(s) Signed: 03/22/2020 4:22:50 PM By: Linton Ham MD Entered By: Linton Ham on 03/22/2020 15:37:32 -------------------------------------------------------------------------------- Physical Exam Details Patient Name: Date of Service: [name] 03/22/2020 2:00 PM Medical Record Number: [id number] Patient Account Number: [id number] Date of Birth/Sex: Treating RN: 1945-08-26 (74 y.o. [name] Primary Care Provider: [name] Other Clinician: Referring Provider: Treating Provider/Extender: [name] in Treatment: 10 Constitutional Sitting or standing Blood Pressure is within target range for patient.. Pulse regular and within target range for patient.Marland Kitchen Respirations regular, non-labored and within target range.. Temperature is normal and within the target range for the patient.Marland Kitchen Appears in no distress. Cardiovascular Difficult to feel pedal pulses through the edema although his foot is warm. His degree of lymphedema is improved. Notes Wound exam; nothing open on the right dorsal foot or anterior tibial area distally. He has a new open area more proximally over the tibia and a small area on the medial blistered area from last week in the upper medial calf Electronic Signature(s) Signed: 03/22/2020 4:22:50 PM By: Linton Ham MD Entered By: Linton Ham on 03/22/2020 15:40:51 -------------------------------------------------------------------------------- Physician Orders Details Patient Name: Date of Service: [name] 03/22/2020 2:00 PM Medical Record Number: [id number] Patient Account Number: [id number] Date of Birth/Sex: Treating RN: [birth year] (74 y.o. [name] Primary Care Provider: [name] Other Clinician: Referring Provider: Treating Provider/Extender: [name] in Treatment: 10 Verbal / Phone Orders: No Diagnosis Coding ICD-10 Coding Code Description I89.0 Lymphedema, not elsewhere classified L97.811 Non-pressure chronic ulcer of other part of right lower leg limited to breakdown of skin L97.821 Non-pressure chronic ulcer of other part of left lower leg limited to breakdown of skin Follow-up Appointments Return Appointment in 2 weeks. Nurse Visit: - 1 week for rewrap Dressing Change Frequency Do not change entire dressing for one week. Skin Barriers/Peri-Wound Care Moisturizing lotion TCA Cream or Ointment - mixed with lotion Wound Cleansing May shower with protection. - with cast  protectors. Primary Wound Dressing Wound #15 Right,Proximal,Medial Lower Leg Calcium Alginate with Silver Wound #16 Right,Anterior Lower Leg Calcium Alginate with Silver Secondary Dressing Wound #15 Right,Proximal,Medial Lower Leg ABD pad Wound #16 Right,Anterior Lower Leg ABD pad Edema Control 4 layer compression - Right Lower Extremity - unna boot at the foot and top of leg to anchor Avoid standing for long periods of time Elevate legs to the level of the heart or above for 30 minutes daily and/or when sitting, a frequency of: - throughout the day. Exercise regularly Support Garment 30-40 mm/Hg pressure to: - Farrow wrap to left leg daily Segmental Compressive Device. - use lymphedema pumps twice a day an hour each time. Once in the morning and once in the evening. Electronic Signature(s) Signed: 03/22/2020 4:22:50 PM By: Linton Ham MD Signed: 03/22/2020 4:24:12 PM By: Levan Hurst RN, BSN Entered By: Levan Hurst on 03/22/2020 15:11:25 -------------------------------------------------------------------------------- Problem List Details Patient Name: Date of Service: [name] 03/22/2020 2:00 PM Medical Record Number: [id number] Patient Account Number: [id number] Date of Birth/Sex: Treating RN: September 27, 1945 (74 y.o. [name] Primary Care Provider: [name] Other  Clinician: Referring Provider: Treating Provider/Extender: [name] in Treatment: 10 Active Problems ICD-10 Encounter Code Description Active Date MDM Diagnosis I89.0 Lymphedema, not elsewhere classified 01/08/2020 No Yes L97.811 Non-pressure chronic ulcer of other part of right lower leg limited to breakdown 01/08/2020 No Yes of skin L97.821 Non-pressure chronic ulcer of other part of left lower leg limited to breakdown 01/08/2020 No Yes of skin Inactive Problems Resolved Problems Electronic Signature(s) Signed: 03/22/2020 4:22:50 PM By: Linton Ham MD Entered By: Linton Ham on 03/22/2020 15:36:27 -------------------------------------------------------------------------------- Progress Note Details Patient Name: Date of Service: [name] 03/22/2020 2:00 PM Medical Record Number: [id number] Patient Account Number: [id number] Date of Birth/Sex: Treating RN: 06/09/1946 (74 y.o. [name] Primary Care Provider: [name] Other Clinician: Referring Provider: Treating Provider/Extender: [name] in Treatment: 10 Subjective History of Present Illness (HPI) ADMISSION 07/04/2018 This is a 74 year old man who has bilateral lymphedema felt to be secondary to previous treatment for malignant melanoma on his left toes. 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 Malignant melanoma was treated with immunotherapy. He is not had radiation. Sometime after this he developed edema in the left leg that spread into the right leg. He has bilateral compression pumps at home and he uses them twice a day and claims to be quite compliant. He also has a juxta lite type stocking at home which is apparently 5-year-old. He states that he fell in early October dislocating and fracturing his left shoulder. He slept in a recliner with his legs dependent. He was seen and followed at the rehab clinic at [facility] and was having lymphedema wraps done by 1 of the PT who noted his open wounds last week and he has been referred here for management. The patient has 2 open wounds on the right s distal lower leg and one on the left anterior lower leg The patient is not a diabetic. 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 For one reason or another we could not get him set up with home health and he kept the wraps on all week which he generally seem to tolerate. He did not use his compression pumps over the top of the wraps which I told him he could do this week. He comes in with a new rash on both feet which looks like tinea 07/22/2018; much better edema control bilaterally. The wounds on the right lateral calf and left anterior lower leg both look a lot better. There is epithelialization over both surfaces but I think this is fragile and I am going to put him in our compression wraps for another week. He says he is been using his compression pumps at home. ooHe has a constellation of lower extremity compression garments that he is going to bring in next week. In discussion with him I cannot really get a sense of everything he has. I think he was at one point prescribed 30-40 below-knee stockings but he points out he could not get them on. 07/29/2018; the patient arrives in his legs are healed. He has a constellation of stockings none of which I  think are going to be that helpful except for the fact that he has external wraparound stockings of some description. He has above-knee stockings but I cannot imagine it would be possible to get these on READMISSION 01/08/2020 Patient is now a 74 year old man. We had him for a short period from December 2019 through January 2020. He has bilateral lower extremity lymphedema which he initially traces to treatment for malignant melanoma 7 years ago. He has been followed at the lymphedema clinic on [facility] and recently has compression pumps that go up to his mid abdomen. He recently developed superficial wounds bilaterally. They will not manage these in the lymphedema clinic where the services are run by therapy. He has not been using his compression pumps. His  significant other cannot get the stockings on his legs and the patient is only limited ability to help because of Parkinson's disease Past medical history is essentially unchanged. He has sleep apnea, Parkinson's disease at some point labeled his Parkinson's plus, bilateral lymphedema, history of malignant melanoma. He does not have an arterial issue that I am aware of although his previous arterial studies have been noncompressible 7/1; patient we admitted the clinic 2 weeks ago. He has severe bilateral lymphedema. He had wounds predominantly on the left leg but also superficially on the right. He has compression pumps which he says he is using once a day. 7/15; 2-week follow-up. He comes in for nurse visit. He has severe bilateral lymphedema. He has upper abdominal level compression pumps which she is using twice a day. We have been wrapping both his lower legs the areas on the right leg are healed. He still has an area of left upper lateral and right anterior medial. Anterior medial wound is small. He is tolerating this well. 7/22; still significant lymphedema even with 4-layer compression and external compression pump usage. He said he had some itching on the right anterior leg he comes in with a large superficial area of skin breakdown anteriorly and a smaller area laterally on the right leg. He also has a rash on the left dorsal ankle crease. Some of these look pustular. I wonder if this is an occlusion issue from foam we put down here. He also states the only thing different this week is the did a 5-hour car ride with his legs dependent. There was nothing open on the right leg last week but we wrapped it in any case 7/27; culture of the blisters on his left anterior leg grew staph aureus. I gave him doxycycline which he started yesterday. He is already appear cleaner and dry. His major wound is on the right anterior lower leg and the right lateral lower leg. There is nothing else open on the  left leg. He has better edema control today. 03/04/20-Patient comes in with new areas after he had a fall, on his left dorsal foot and right dorsal foot and left medial proximal leg, there is significant amount of drainage into his dressing that was removed today which is actually greenish in color. It looks like he is completed a course of doxycycline for staph that grew from his blisters however he might need antipseudomonal coverage at this point 8/16; apparently the wounds originally were all healed until his last visit when after a fall he had areas on the left dorsal foot, right dorsal foot and left medial proximal leg. T oday all of these have healed and he has a small area on the left medial ankle. The patient has  severe bilateral lymphedema which is managed usually with compression stockings/external compression stockings as well his mid abdomen compression pumps. He is still using the latter twice a day. 8/23; the wounds on his dorsal feet and medial ankle that he had from a fall last time of closed however he has a new wrap injury on the left medial upper leg and a smaller 1 anteriorly. He is using his compression pumps twice a day 8/30; nothing open on the left leg. He is in his stocking. Using his compression pumps twice a day. From last week still a small opening in the medial upper leg on the right he has a new area in the upper mid tibia. He thinks this might have been a wrap injury looks more like the remanent of the blister Objective Constitutional Sitting or standing Blood Pressure is within target range for patient.. Pulse regular and within target range for patient.Marland Kitchen Respirations regular, non-labored and within target range.. Temperature is normal and within the target range for the patient.Marland Kitchen Appears in no distress. Vitals Time Taken: 2:09 PM, Height: 67 in, Weight: 250 lbs, BMI: 39.2, Temperature: 97.9 F, Pulse: 85 bpm, Respiratory Rate: 19 breaths/min, Blood Pressure: 119/75  mmHg. Cardiovascular Difficult to feel pedal pulses through the edema although his foot is warm. His degree of lymphedema is improved. General Notes: Wound exam; nothing open on the right dorsal foot or anterior tibial area distally. He has a new open area more proximally over the tibia and a small area on the medial blistered area from last week in the upper medial calf Integumentary (Hair, Skin) Wound #14 status is Healed - Epithelialized. Original cause of wound was Gradually Appeared. The wound is located on the Right,Medial Lower Leg. The wound measures 0cm length x 0cm width x 0cm depth; 0cm^2 area and 0cm^3 volume. There is no tunneling or undermining noted. There is a none present amount of drainage noted. The wound margin is distinct with the outline attached to the wound base. There is no granulation within the wound bed. There is no necrotic tissue within the wound bed. Wound #15 status is Open. Original cause of wound was Gradually Appeared. The wound is located on the Right,Proximal,Medial Lower Leg. The wound measures 0.5cm length x 0.5cm width x 0.1cm depth; 0.196cm^2 area and 0.02cm^3 volume. There is Fat Layer (Subcutaneous Tissue) exposed. There is no tunneling or undermining noted. There is a small amount of serous drainage noted. The wound margin is flat and intact. There is large (67-100%) pink granulation within the wound bed. There is no necrotic tissue within the wound bed. Wound #16 status is Open. Original cause of wound was Blister. The wound is located on the Right,Anterior Lower Leg. The wound measures 0.8cm length x 0.6cm width x 0.1cm depth; 0.377cm^2 area and 0.038cm^3 volume. 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 There was a Four Layer Compression Therapy Procedure by Levan Hurst, RN. Post procedure Diagnosis Wound #15: Same as Pre-Procedure Wound #16 Pre-procedure diagnosis of Wound #16 is a Lymphedema located on the Right,Anterior Lower Leg . There was a Four Layer Compression Therapy Procedure by Levan Hurst, RN. Post procedure Diagnosis Wound #16: Same as Pre-Procedure Plan Follow-up Appointments: Return Appointment  in 2 weeks. Nurse Visit: - 1 week for rewrap Dressing Change Frequency: Do not change entire dressing for one week. Skin Barriers/Peri-Wound Care: Moisturizing lotion TCA Cream or Ointment - mixed with lotion Wound Cleansing: May shower with protection. - with cast protectors. Primary Wound Dressing: Wound #15 Right,Proximal,Medial Lower Leg: Calcium Alginate with Silver Wound #16 Right,Anterior Lower Leg: Calcium Alginate with Silver Secondary Dressing: Wound #15 Right,Proximal,Medial Lower Leg: ABD pad Wound #16 Right,Anterior Lower Leg: ABD pad Edema Control: 4 layer compression - Right Lower Extremity - unna boot at the foot and top of leg to anchor Avoid standing for long periods of time Elevate legs to the level of the heart or above for 30 minutes daily and/or when sitting, a frequency of: - throughout the day. Exercise regularly Support Garment 30-40 mm/Hg pressure to: - Farrow wrap to left leg daily Segmental Compressive Device. - use lymphedema pumps twice a day an hour each time. Once in the morning and once in the evening. 1. I continue with silver alginate as the primary dressing under 4-layer compression on the right leg 2. He is wearing a Farrow wrap on the left and has 1 for the right hopefully we can apply next week 3. Compression pump usage to the mid abdomen 1 hour twice a day 4. Hopefully closed by next week Electronic Signature(s) Signed: 03/22/2020 4:22:50  PM By: Linton Ham MD Entered By: Linton Ham on 03/22/2020 15:41:55 -------------------------------------------------------------------------------- SuperBill Details Patient Name: Date of Service: [name] 03/22/2020 Medical Record Number: [id number] Patient Account Number: [id number] Date of Birth/Sex: Treating RN: [birth year] (74 y.o. [name] Primary Care Provider: [name] Other Clinician: Referring Provider: Treating Provider/Extender: [name] in Treatment: 10 Diagnosis Coding ICD-10 Codes Code Description I89.0 Lymphedema, not elsewhere classified L97.811 Non-pressure chronic ulcer of other part of right lower leg limited to breakdown of skin L97.821 Non-pressure chronic ulcer of other part of left lower leg limited to breakdown of skin Facility Procedures CPT4 Code: [id number] Description: (Facility Use Only) [phone] - APPLY MULTLAY COMPRS LWR RT LEG Modifier: Quantity: 1 Physician Procedures : CPT4 Code Description Modifier [id number] - WC PHYS LEVEL 3 - EST PT ICD-10 Diagnosis Description I89.0 Lymphedema, not elsewhere classified L97.811 Non-pressure chronic ulcer of other part of right lower leg limited to breakdown of skin L97.821  Non-pressure chronic ulcer of other part of left lower leg limited to breakdown of skin Quantity: 1 Electronic Signature(s) Signed: 03/22/2020 4:22:50 PM By: Linton Ham MD Entered By: Linton Ham on 03/22/2020 15:42:13

## 2020-03-24 NOTE — Progress Notes (Signed)
DEONE LEIFHEIT ([id number]) , Visit Report for 03/22/2020 Arrival Information Details Patient Name: Date of Service: Nicholas [name] DFO RD K. 03/22/2020 2:00 PM Medical Record Number: [id number] Patient Account Number: [id number] Date of Birth/Sex: Treating RN: 08/21/1945 (74 y.o. Jonette Eva, Briant Cedar Primary Care Dyann Goodspeed: [name] Other Clinician: Referring Carel Carrier: Treating Omer Puccinelli/Extender: [name] in Treatment: 10 Visit Information History Since Last Visit Added or deleted any medications: No Patient Arrived: Wheel Chair Any new allergies or adverse reactions: No Arrival Time: 14:06 Had a fall or experienced change in No Accompanied By: wife activities of daily living that may affect Transfer Assistance: Manual risk of falls: Patient Identification Verified: Yes Signs or symptoms of abuse/neglect since last visito No Secondary Verification Process Completed: Yes Hospitalized since last visit: No Patient Requires Transmission-Based Precautions: No Implantable device outside of the clinic excluding No Patient Has Alerts: Yes cellular tissue based products placed in the center Patient Alerts: ABI: Black 07/2018 since last visit: Has Dressing in Place as Prescribed: Yes Pain Present Now: No Electronic Signature(s) Signed: 03/24/2020 11:00:36 AM By: Sandre Kitty Entered By: Sandre Kitty on 03/22/2020 14:09:08 -------------------------------------------------------------------------------- Compression Therapy Details Patient Name: Date of Service: Nicholas [name] DFO RD K. 03/22/2020 2:00 PM Medical Record Number: [id number] Patient Account Number: [id number] Date of Birth/Sex: Treating RN: [birth year] (74 y.o. [name] Primary Care Coriann Brouhard: [name] Other Clinician: Referring Glynna Failla: Treating Elgin Carn/Extender: [name] in Treatment: 10 Compression Therapy Performed for Wound Assessment: Wound  #15 Right,Proximal,Medial Lower Leg Performed By: Clinician Levan Hurst, RN Compression Type: Four Layer Post Procedure Diagnosis Same as Pre-procedure Electronic Signature(s) Signed: 03/22/2020 4:24:12 PM By: Levan Hurst RN, BSN Entered By: Levan Hurst on 03/22/2020 15:12:08 -------------------------------------------------------------------------------- Compression Therapy Details Patient Name: Date of Service: Nicholas [name] DFO RD K. 03/22/2020 2:00 PM Medical Record Number: [id number] Patient Account Number: [id number] Date of Birth/Sex: Treating RN: [birth year] (74 y.o. [name] Primary Care Yoni Lobos: [name] Other Clinician: Referring Latron Ribas: Treating Jaidy Cottam/Extender: [name] in Treatment: 10 Compression Therapy Performed for Wound Assessment: Wound #16 Right,Anterior Lower Leg Performed By: Clinician Levan Hurst, RN Compression Type: Four Layer Post Procedure Diagnosis Same as Pre-procedure Electronic Signature(s) Signed: 03/22/2020 4:24:12 PM By: Levan Hurst RN, BSN Entered By: Levan Hurst on 03/22/2020 15:12:09 -------------------------------------------------------------------------------- Encounter Discharge Information Details Patient Name: Date of Service: Nicholas [name] DFO RD K. 03/22/2020 2:00 PM Medical Record Number: [id number] Patient Account Number: [id number] Date of Birth/Sex: Treating RN: [birth year] (74 y.o. [name] Primary Care Michell Kader: [name] Other Clinician: Referring Shakari Qazi: Treating Brenden Rudman/Extender: [name] in Treatment: 10 Encounter Discharge Information Items Discharge Condition: Stable Ambulatory Status: Wheelchair Discharge Destination: Home Transportation: Private Auto Accompanied By: wife Schedule Follow-up Appointment: Yes Clinical Summary of Care: Patient Declined Electronic Signature(s) Signed: 03/22/2020 4:26:23 PM  By: Kela Millin Entered By: Kela Millin on 03/22/2020 15:39:10 -------------------------------------------------------------------------------- Lower Extremity Assessment Details Patient Name: Date of Service: Nicholas [name] DFO RD K. 03/22/2020 2:00 PM Medical Record Number: [id number] Patient Account Number: [id number] Date of Birth/Sex: Treating RN: 1945/10/06 (74 y.o. [name] Primary Care Kingslee Mairena: [name] Other Clinician: Referring Phillipa Morden: Treating Kairen Hallinan/Extender: [name] in Treatment: 10 Edema Assessment Assessed: [name]: No] [name]: No] Edema: [Left: Ye] [Right: s] Calf Left: Right: Point of Measurement: 40 cm From Medial Instep cm 41.4 cm Ankle Left: Right: Point of Measurement: 19 cm From  Medial Instep cm 35.2 cm Vascular Assessment Pulses: Dorsalis Pedis Palpable: [Right:No] Electronic Signature(s) Signed: 03/22/2020 4:32:31 PM By: Baruch Gouty RN, BSN Entered By: Baruch Gouty on 03/22/2020 14:30:07 -------------------------------------------------------------------------------- Multi Wound Chart Details Patient Name: Date of Service: Nicholas [name] DFO RD K. 03/22/2020 2:00 PM Medical Record Number: [id number] Patient Account Number: [id number] Date of Birth/Sex: Treating RN: 03/23/1946 (73 y.o. [name] Primary Care Tineka Uriegas: [name] Other Clinician: Referring Gini Caputo: Treating Cj Edgell/Extender: [name] in Treatment: 10 Vital Signs Height(in): 67 Pulse(bpm): 85 Weight(lbs): 250 Blood Pressure(mmHg): 119/75 Body Mass Index(BMI): 39 Temperature(F): 97.9 Respiratory Rate(breaths/min): 19 Photos: [14:No Photos Right, Medial Lower Leg] [15:No Photos Right, Proximal, Medial Lower Leg] [16:No Photos Right, Anterior Lower Leg] Wound Location: [14:Gradually Appeared] [15:Gradually Appeared] [16:Blister] Wounding Event: [14:Lymphedema]  [15:Lymphedema] [16:Lymphedema] Primary Etiology: [14:Lymphedema, Sleep Apnea,] [15:Lymphedema, Sleep Apnea,] [16:N/A] Comorbid History: [14:Congestive Heart Failure, Hypertension, Peripheral Venous Disease, Received Chemotherapy 03/08/2020] [15:Congestive Heart Failure, Hypertension, Peripheral Venous Disease, Received Chemotherapy 03/15/2020] [16:03/22/2020] Date Acquired: [14:2] [15:1] [16:0] Weeks of Treatment: [14:Healed - Epithelialized] [15:Open] [16:Open] Wound Status: [14:0x0x0] [15:0.5x0.5x0.1] [16:0.8x0.6x0.1] Measurements L x W x D (cm) [14:0] [15:0.196] [16:0.377] A (cm) : rea [14:0] [15:0.02] [16:0.038] Volume (cm) : [14:100.00%] [15:98.30%] [16:N/A] % Reduction in Area: [14:100.00%] [15:98.30%] [16:N/A] % Reduction in Volume: [14:Full Thickness Without Exposed] [15:Full Thickness Without Exposed] [16:N/A] Classification: [14:Support Structures None Present] [15:Support Structures Small] [16:N/A] Exudate Amount: [14:N/A] [15:Serous] [16:N/A] Exudate Type: [14:N/A] [15:amber] [16:N/A] Exudate Color: [14:Distinct, outline attached] [15:Flat and Intact] [16:N/A] Wound Margin: [14:None Present (0%)] [15:Large (67-100%)] [16:N/A] Granulation Amount: [14:N/A] [15:Pink] [16:N/A] Granulation Quality: [14:None Present (0%)] [15:None Present (0%)] [16:N/A] Necrotic Amount: [14:Fascia: No] [15:Fat Layer (Subcutaneous Tissue): Yes N/A] Exposed Structures: [14:Fat Layer (Subcutaneous Tissue): No Tendon: No Muscle: No Joint: No Bone: No Large (67-100%)] [15:Fascia: No Tendon: No Muscle: No Joint: No Bone: No Medium (34-66%)] [16:N/A] Epithelialization: [14:N/A] [15:Compression Therapy] [16:Compression Therapy] Procedures Performed: Treatment Notes Electronic Signature(s) Signed: 03/22/2020 4:22:50 PM By: Linton Ham MD Signed: 03/22/2020 4:24:12 PM By: Levan Hurst RN, BSN Entered By: Linton Ham on 03/22/2020  15:36:32 -------------------------------------------------------------------------------- Multi-Disciplinary Care Plan Details Patient Name: Date of Service: Nicholas [name] DFO RD K. 03/22/2020 2:00 PM Medical Record Number: [id number] Patient Account Number: [id number] Date of Birth/Sex: Treating RN: [birth year] (74 y.o. [name] Primary Care Tzipporah Nagorski: [name] Other Clinician: Referring Ivianna Notch: Treating Ambry Dix/Extender: [name] in Treatment: 10 Active Inactive Venous Leg Ulcer Nursing Diagnoses: Potential for venous Insuffiency (use before diagnosis confirmed) Goals: Patient will maintain optimal edema control Date Initiated: 01/08/2020 Target Resolution Date: 04/02/2020 Goal Status: Active Patient/caregiver will verbalize understanding of disease process and disease management Date Initiated: 01/08/2020 Date Inactivated: 02/05/2020 Target Resolution Date: 01/30/2020 Goal Status: Met Interventions: Assess peripheral edema status every visit. Notes: Wound/Skin Impairment Nursing Diagnoses: Knowledge deficit related to ulceration/compromised skin integrity Goals: Patient/caregiver will verbalize understanding of skin care regimen Date Initiated: 01/08/2020 Target Resolution Date: 04/02/2020 Goal Status: Active Ulcer/skin breakdown will have a volume reduction of 30% by week 4 Date Initiated: 01/08/2020 Date Inactivated: 02/05/2020 Target Resolution Date: 01/30/2020 Goal Status: Met Interventions: Assess patient/caregiver ability to obtain necessary supplies Assess patient/caregiver ability to perform ulcer/skin care regimen upon admission and as needed Provide education on ulcer and skin care Treatment Activities: Skin care regimen initiated : 01/08/2020 Topical wound management initiated : 01/08/2020 Notes: Electronic Signature(s) Signed: 03/22/2020 4:24:12 PM By: Levan Hurst RN, BSN Entered By: Levan Hurst on 03/22/2020  15:29:27 --------------------------------------------------------------------------------  Pain Assessment Details Patient Name: Date of Service: Nicholas [name] DFO RD K. 03/22/2020 2:00 PM Medical Record Number: [id number] Patient Account Number: [id number] Date of Birth/Sex: Treating RN: 07-May-1946 (74 y.o. [name] Primary Care Kayle Correa: [name] Other Clinician: Referring Wallace Gappa: Treating Aurelie Dicenzo/Extender: [name] in Treatment: 10 Active Problems Location of Pain Severity and Description of Pain Patient Has Paino No Site Locations Pain Management and Medication Current Pain Management: Electronic Signature(s) Signed: 03/22/2020 4:24:12 PM By: Levan Hurst RN, BSN Signed: 03/24/2020 11:00:36 AM By: Sandre Kitty Entered By: Sandre Kitty on 03/22/2020 14:09:31 -------------------------------------------------------------------------------- Patient/Caregiver Education Details Patient Name: Date of Service: Nicholas [name] DFO RD K. 8/30/2021andnbsp2:00 PM Medical Record Number: [id number] Patient Account Number: [id number] Date of Birth/Gender: Treating RN: 09/29/1945 (74 y.o. [name] Primary Care Physician: [name] Other Clinician: Referring Physician: Treating Physician/Extender: [name] in Treatment: 10 Education Assessment Education Provided To: Patient Education Topics Provided Wound/Skin Impairment: Methods: Explain/Verbal Responses: State content correctly [employer]) Signed: 03/22/2020 4:24:12 PM By: Levan Hurst RN, BSN Entered By: Levan Hurst on 03/22/2020 15:29:37 -------------------------------------------------------------------------------- Wound Assessment Details Patient Name: Date of Service: Nicholas [name] DFO RD K. 03/22/2020 2:00 PM Medical Record Number: [id number] Patient Account Number: [id number] Date of Birth/Sex: Treating  RN: 04/07/1946 (73 y.o. [name] Primary Care Ladesha Pacini: [name] Other Clinician: Referring Jumanah Hynson: Treating Princella Jaskiewicz/Extender: [name] in Treatment: 10 Wound Status Wound Number: 14 Primary Lymphedema Etiology: Wound Location: Right, Medial Lower Leg Wound Healed - Epithelialized Wounding Event: Gradually Appeared Status: Date Acquired: 03/08/2020 Comorbid Lymphedema, Sleep Apnea, Congestive Heart Failure, Weeks Of Treatment: 2 History: Hypertension, Peripheral Venous Disease, Received Chemotherapy Clustered Wound: No Photos Photo Uploaded By: Mikeal Hawthorne on 03/23/2020 11:21:34 Wound Measurements Length: (cm) Width: (cm) Depth: (cm) Area: (cm) Volume: (cm) 0 % Reduction in Area: 100% 0 % Reduction in Volume: 100% 0 Epithelialization: Large (67-100%) 0 Tunneling: No 0 Undermining: No Wound Description Classification: Full Thickness Without Exposed Support Structures Wound Margin: Distinct, outline attached Exudate Amount: None Present Foul Odor After Cleansing: No Slough/Fibrino No Wound Bed Granulation Amount: None Present (0%) Exposed Structure Necrotic Amount: None Present (0%) Fascia Exposed: No Fat Layer (Subcutaneous Tissue) Exposed: No Tendon Exposed: No Muscle Exposed: No Joint Exposed: No Bone Exposed: No Electronic Signature(s) Signed: 03/22/2020 4:24:12 PM By: Levan Hurst RN, BSN Signed: 03/22/2020 4:32:31 PM By: Baruch Gouty RN, BSN Entered By: Baruch Gouty on 03/22/2020 14:31:41 -------------------------------------------------------------------------------- Wound Assessment Details Patient Name: Date of Service: Nicholas [name] DFO RD K. 03/22/2020 2:00 PM Medical Record Number: [id number] Patient Account Number: [id number] Date of Birth/Sex: Treating RN: April 08, 1946 (74 y.o. [name] Primary Care Charleston Hankin: [name] Other Clinician: Referring Malonie Tatum: Treating  Mischa Pollard/Extender: [name] in Treatment: 10 Wound Status Wound Number: 15 Primary Lymphedema Etiology: Wound Location: Right, Proximal, Medial Lower Leg Wound Open Wounding Event: Gradually Appeared Status: Date Acquired: 03/15/2020 Comorbid Lymphedema, Sleep Apnea, Congestive Heart Failure, Weeks Of Treatment: 1 History: Hypertension, Peripheral Venous Disease, Received Chemotherapy Clustered Wound: No Photos Photo Uploaded By: Mikeal Hawthorne on 03/23/2020 11:20:48 Wound Measurements Length: (cm) 0.5 Width: (cm) 0.5 Depth: (cm) 0.1 Area: (cm) 0.196 Volume: (cm) 0.02 % Reduction in Area: 98.3% % Reduction in Volume: 98.3% Epithelialization: Medium (34-66%) Tunneling: No Undermining: No Wound Description Classification: Full Thickness Without Exposed Support Structu Wound Margin: Flat and Intact Exudate Amount: Small Exudate Type: Serous Exudate Color: amber  res Foul Odor After Cleansing: No Slough/Fibrino No Wound Bed Granulation Amount: Large (67-100%) Exposed Structure Granulation Quality: Pink Fascia Exposed: No Necrotic Amount: None Present (0%) Fat Layer (Subcutaneous Tissue) Exposed: Yes Tendon Exposed: No Muscle Exposed: No Joint Exposed: No Bone Exposed: No Treatment Notes Wound #15 (Right, Proximal, Medial Lower Leg) 1. Cleanse With Wound Cleanser Soap and water 2. Periwound Care Moisturizing lotion 3. Primary Dressing Applied Calcium Alginate Ag 4. Secondary Dressing Dry Gauze 6. Support Layer Applied 4 layer compression wrap Notes unna boot at top to Educational psychologist) Signed: 03/22/2020 4:32:31 PM By: Baruch Gouty RN, BSN Entered By: Baruch Gouty on 03/22/2020 14:31:06 -------------------------------------------------------------------------------- Wound Assessment Details Patient Name: Date of Service: Nicholas [name] DFO RD K. 03/22/2020 2:00 PM Medical Record Number:  [id number] Patient Account Number: [id number] Date of Birth/Sex: Treating RN: 12-12-1945 (74 y.o. [name] Primary Care Guerry Covington: [name] Other Clinician: Referring Rodina Pinales: Treating Evian Derringer/Extender: [name] in Treatment: 10 Wound Status Wound Number: 16 Primary Etiology: Lymphedema Wound Location: Right, Anterior Lower Leg Wound Status: Open Wounding Event: Blister Date Acquired: 03/22/2020 Weeks Of Treatment: 0 Clustered Wound: No Photos Photo Uploaded By: Mikeal Hawthorne on 03/23/2020 11:20:31 Wound Measurements Length: (cm) Width: (cm) Depth: (cm) Area: (cm) Volume: (cm) 0.8 % Reduction in Area: 0.6 % Reduction in Volume: 0.1 0.377 0.038 Treatment Notes Wound #16 (Right, Anterior Lower Leg) 1. Cleanse With Wound Cleanser Soap and water 2. Periwound Care Moisturizing lotion 3. Primary Dressing Applied Calcium Alginate Ag 4. Secondary Dressing Dry Gauze 6. Support Layer Applied 4 layer compression wrap Notes unna boot at top to Educational psychologist) Signed: 03/22/2020 4:24:12 PM By: Levan Hurst RN, BSN Signed: 03/24/2020 11:00:36 AM By: Sandre Kitty Entered By: Sandre Kitty on 03/22/2020 14:16:11 -------------------------------------------------------------------------------- [place] Details Patient Name: Date of Service: Nicholas [name] DFO RD K. 03/22/2020 2:00 PM Medical Record Number: [id number] Patient Account Number: [id number] Date of Birth/Sex: Treating RN: 10-30-1945 (74 y.o. [name] Primary Care Saree Krogh: [name] Other Clinician: Referring Madeleyn Schwimmer: Treating Siyana Erney/Extender: [name] in Treatment: 10 Vital Signs Time Taken: 14:09 Temperature (F): 97.9 Height (in): 67 Pulse (bpm): 85 Weight (lbs): 250 Respiratory Rate (breaths/min): 19 Body Mass Index (BMI): 39.2 Blood Pressure (mmHg): 119/75 Reference Range: 80  - 120 mg / dl Electronic Signature(s) Signed: 03/24/2020 11:00:36 AM By: Sandre Kitty Entered By: Sandre Kitty on 03/22/2020 14:09:22

## 2020-03-30 ENCOUNTER — Other Ambulatory Visit: Payer: Self-pay

## 2020-03-30 ENCOUNTER — Encounter (HOSPITAL_BASED_OUTPATIENT_CLINIC_OR_DEPARTMENT_OTHER): Payer: PPO | Attending: Internal Medicine | Admitting: Internal Medicine

## 2020-03-30 DIAGNOSIS — Z85820 Personal history of malignant melanoma of skin: Secondary | ICD-10-CM | POA: Insufficient documentation

## 2020-03-30 DIAGNOSIS — I110 Hypertensive heart disease with heart failure: Secondary | ICD-10-CM | POA: Diagnosis not present

## 2020-03-30 DIAGNOSIS — L03115 Cellulitis of right lower limb: Secondary | ICD-10-CM | POA: Insufficient documentation

## 2020-03-30 DIAGNOSIS — E785 Hyperlipidemia, unspecified: Secondary | ICD-10-CM | POA: Diagnosis not present

## 2020-03-30 DIAGNOSIS — G20 Parkinson's disease: Secondary | ICD-10-CM | POA: Diagnosis not present

## 2020-03-30 DIAGNOSIS — I5032 Chronic diastolic (congestive) heart failure: Secondary | ICD-10-CM | POA: Insufficient documentation

## 2020-03-30 DIAGNOSIS — L97811 Non-pressure chronic ulcer of other part of right lower leg limited to breakdown of skin: Secondary | ICD-10-CM | POA: Diagnosis not present

## 2020-03-30 DIAGNOSIS — G4733 Obstructive sleep apnea (adult) (pediatric): Secondary | ICD-10-CM | POA: Insufficient documentation

## 2020-03-30 DIAGNOSIS — I890 Lymphedema, not elsewhere classified: Secondary | ICD-10-CM | POA: Insufficient documentation

## 2020-03-30 DIAGNOSIS — N400 Enlarged prostate without lower urinary tract symptoms: Secondary | ICD-10-CM | POA: Insufficient documentation

## 2020-03-30 NOTE — Progress Notes (Signed)
JAYTHAN HINELY ([id number]) , Visit Report for 03/30/2020 HPI Details Patient Name: Date of Service: Nicholas [name] DFO RD K. 03/30/2020 11:15 A M Medical Record Number: [id number] Patient Account Number: [id number] Date of Birth/Sex: Treating RN: 1946/05/21 (74 y.o. [name]) Carlene Coria Primary Care Provider: [name] Other Clinician: Referring Provider: Treating Provider/Extender: [name] in Treatment: 11 History of Present Illness HPI Description: ADMISSION 07/04/2018 This is a 74 year old man who has bilateral lymphedema felt to be secondary to previous treatment for malignant melanoma on his left toes. 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 Malignant melanoma was treated with immunotherapy. He is not had radiation. Sometime after this he developed edema in the left leg that spread into the right leg. He has bilateral compression pumps at home and he uses them twice a day and claims to be quite compliant. He also has a juxta lite type stocking at home which is apparently 52-year-old. He states that he fell in early October dislocating and fracturing his left shoulder. He slept in a recliner with his legs dependent. He was seen and followed at the rehab clinic at [facility] and was having lymphedema wraps done by 1 of the PT who noted his open wounds last week and he has been referred here for management. The patient has 2 open wounds on the right s distal lower leg and one on the left anterior lower leg The patient is not a diabetic. 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 For one reason or another we could not get him set up with home health and he kept the wraps on all week which he generally seem to tolerate. He did not use his compression pumps over the top of the wraps which I told him he could do this week. He comes in with a new rash on both feet which looks like tinea 07/22/2018; much better edema control bilaterally. The wounds on the right lateral calf and left anterior lower leg both look a lot better. There is epithelialization over both surfaces but I think this is fragile and I am going to put him in our compression wraps for another week. He says he is been using his compression pumps at home. He has a constellation of lower extremity compression garments that he is going to bring in next week. In discussion with him I cannot really get a sense of everything he has. I think he was at one point prescribed 30-40 below-knee stockings but he points out he could not get them on. 07/29/2018; the patient arrives in his legs are healed. He has a constellation of stockings none of which I think are going to be that helpful except for the fact that he has external wraparound stockings of some description. He has above-knee stockings but I cannot imagine it would be possible to get these on READMISSION 01/08/2020 Patient is now a 74 year old man We had him for a short period from December 2019 through January 2020. He has bilateral lower extremity lymphedema which he initially traces to treatment for malignant melanoma 7 years ago. He has been followed at the lymphedema clinic  on [employer] and recently has compression pumps that go up to his mid abdomen. He recently developed superficial wounds bilaterally. They will not manage these in the lymphedema clinic where the services are run by therapy. He has not been using his compression pumps. His significant other cannot get the stockings on his legs and the patient is only limited ability to help because  of Parkinson's disease Past medical history is essentially unchanged. He has sleep apnea, Parkinson's disease at some point labeled his Parkinson's plus, bilateral lymphedema, history of malignant melanoma. He does not have an arterial issue that I am aware of although his previous arterial studies have been noncompressible 7/1; patient we admitted the clinic 2 weeks ago. He has severe bilateral lymphedema. He had wounds predominantly on the left leg but also superficially on the right. He has compression pumps which he says he is using once a day. 7/15; 2-week follow-up. He comes in for nurse visit. He has severe bilateral lymphedema. He has upper abdominal level compression pumps which she is using twice a day. We have been wrapping both his lower legs the areas on the right leg are healed. He still has an area of left upper lateral and right anterior medial. Anterior medial wound is small. He is tolerating this well. 7/22; still significant lymphedema even with 4-layer compression and external compression pump usage. He said he had some itching on the right anterior leg he comes in with a large superficial area of skin breakdown anteriorly and a smaller area laterally on the right leg. He also has a rash on the left dorsal ankle crease. Some of these look pustular. I wonder if this is an occlusion issue from foam we put down here. He also states the only thing different this week is the did a 5-hour car ride with his legs dependent. There was nothing open on the right leg last week but we wrapped it in any case 7/27; culture of the blisters on his left anterior leg grew staph aureus. I gave him doxycycline which he started yesterday. He is already appear cleaner and dry. His major wound is on the right anterior lower leg and the right lateral lower leg. There is nothing else open on the left leg. He has better edema control today. 03/04/20-Patient comes in with new areas after he had a fall, on his  left dorsal foot and right dorsal foot and left medial proximal leg, there is significant amount of drainage into his dressing that was removed today which is actually greenish in color. It looks like he is completed a course of doxycycline for staph that grew from his blisters however he might need antipseudomonal coverage at this point 8/16; apparently the wounds originally were all healed until his last visit when after a fall he had areas on the left dorsal foot, right dorsal foot and left medial proximal leg. T oday all of these have healed and he has a small area on the left medial ankle. The patient has severe bilateral lymphedema which is managed usually with compression stockings/external compression stockings as well his mid abdomen compression pumps. He is still using the latter twice a day. 8/23; the wounds on his dorsal feet and medial ankle that he had from a fall last time of closed however he has a new wrap injury on the left medial upper leg and a smaller 1 anteriorly. He is using his compression pumps twice a day 8/30; nothing open on the left leg. He  is in his stocking. Using his compression pumps twice a day. From last week still a small opening in the medial upper leg on the right he has a new area in the upper mid tibia. He thinks this might have been a wrap injury looks more like the remanent of the blister 9/7; the areas on the right anterior and right medial lower leg have both closed over. He came in for a nurse visit today but I looked at the right leg and indeed it is healed. His edema control is moderate but certainly a lot better than when I first saw this. He is using his compression pumps twice a day over his juxta lite stockings Electronic Signature(s) Signed: 03/30/2020 5:12:55 PM By: Linton Ham MD Entered By: Linton Ham on 03/30/2020 13:20:57 -------------------------------------------------------------------------------- Physical Exam Details Patient  Name: Date of Service: Nicholas [name] DFO RD K. 03/30/2020 11:15 A M Medical Record Number: [id number] Patient Account Number: [id number] Date of Birth/Sex: Treating RN: [birth year] (74 y.o. [name] Primary Care Provider: [name] Other Clinician: Referring Provider: Treating Provider/Extender: [name] in Treatment: 11 Respiratory work of breathing is normal. Cardiovascular Lymphedema bilaterally. Edema control is moderate. Psychiatric appears at normal baseline. Notes Wound exam; nothing is open in the right anterior or right lateral lower leg or the dorsal foot. Everything is closed Electronic Signature(s) Signed: 03/30/2020 5:12:55 PM By: Linton Ham MD Entered By: Linton Ham on 03/30/2020 13:21:38 -------------------------------------------------------------------------------- Physician Orders Details Patient Name: Date of Service: Nicholas [name] DFO RD K. 03/30/2020 11:15 A M Medical Record Number: [id number] Patient Account Number: [id number] Date of Birth/Sex: Treating RN: 01/30/1946 (74 y.o. [name] Primary Care Provider: [name] Other Clinician: Referring Provider: Treating Provider/Extender: [name] in Treatment: 11 Verbal / Phone Orders: No Diagnosis Coding Discharge From [facility] Services Discharge from [place] Skin Barriers/Peri-Wound Care Moisturizing lotion - both legs daily Edema Control Avoid standing for long periods of time Elevate legs to the level of the heart or above for 30 minutes daily and/or when sitting, a frequency of: Exercise regularly Support Garment 30-40 mm/Hg pressure to: - farrow wrap to both legs daily Segmental Compressive Device. - Lymphedema pump twice a day for 1 hour each time Electronic Signature(s) Signed: 03/30/2020 5:12:55 PM By: Linton Ham MD Signed: 03/30/2020 5:32:29 PM By: Levan Hurst RN, BSN Entered By: Levan Hurst on  03/30/2020 12:30:39 -------------------------------------------------------------------------------- Problem List Details Patient Name: Date of Service: Nicholas [name] DFO RD K. 03/30/2020 11:15 A M Medical Record Number: [id number] Patient Account Number: [id number] Date of Birth/Sex: Treating RN: 1946-02-26 (74 y.o. 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 03/30/2020 11:15 A M Medical Record Number: [id number] Patient Account Number: [id number] Date of Birth/Sex: Treating RN: 1946/04/15 (74 y.o. [name] Primary Care Provider: [name] Other  Clinician: Referring Provider: Treating Provider/Extender: [name] in Treatment: 11 Subjective History of Present Illness (HPI) ADMISSION 07/04/2018 This is a 74 year old man who has bilateral lymphedema felt to be secondary to previous treatment for malignant melanoma on his left toes. 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 Malignant melanoma was treated with immunotherapy. He is not had radiation. Sometime after this he developed edema in the left leg that spread  into the right leg. He has bilateral compression pumps at home and he uses them twice a day and claims to be quite compliant. He also has a juxta lite type stocking at home which is apparently 26-year-old. He states that he fell in early October dislocating and fracturing his left shoulder. He slept in a recliner with his legs dependent. He was seen and followed at the rehab clinic at [facility] and was having lymphedema wraps done by 1 of the PT who noted his open wounds last week and he has been referred here for management. The patient has 2 open wounds on the right s distal lower leg and one on the left anterior lower leg The patient is not a diabetic. 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 For one reason or another we could not get him set up with home health and he kept the wraps on all week which he generally seem to tolerate. He did not use his compression pumps over the top of the wraps which I told him he could do this week. He comes in with a new rash on both feet which looks like tinea 07/22/2018; much better edema control bilaterally. The wounds on the right lateral calf and left anterior lower leg both look a lot better. There is epithelialization over both surfaces but I think this is fragile and I am going to put him in our compression wraps for another week. He says he is been using his compression pumps at home. ooHe has a constellation of lower extremity compression garments that he is going to bring in next week. In discussion with him I cannot really get a sense of everything he has. I think he was at one point  prescribed 30-40 below-knee stockings but he points out he could not get them on. 07/29/2018; the patient arrives in his legs are healed. He has a constellation of stockings none of which I think are going to be that helpful except for the fact that he has external wraparound stockings of some description. He has above-knee stockings but I cannot imagine it would be possible to get these on READMISSION 01/08/2020 Patient is now a 74 year old man. We had him for a short period from December 2019 through January 2020. He has bilateral lower extremity lymphedema which he initially traces to treatment for malignant melanoma 7 years ago. He has been followed at the lymphedema clinic on [facility] and recently has compression pumps that go up to his mid abdomen. He recently developed superficial wounds bilaterally. They will not manage these in the lymphedema clinic where the services are run by therapy. He has not been using his compression pumps. His significant other cannot get the  stockings on his legs and the patient is only limited ability to help because of Parkinson's disease Past medical history is essentially unchanged. He has sleep apnea, Parkinson's disease at some point labeled his Parkinson's plus, bilateral lymphedema, history of malignant melanoma. He does not have an arterial issue that I am aware of although his previous arterial studies have been noncompressible 7/1; patient we admitted the clinic 2 weeks ago. He has severe bilateral lymphedema. He had wounds predominantly on the left leg but also superficially on the right. He has compression pumps which he says he is using once a day. 7/15; 2-week follow-up. He comes in for nurse visit. He has severe bilateral lymphedema. He has upper abdominal level compression pumps which she is using twice a day. We have been wrapping both his lower legs the areas on the right leg are healed. He still has an area of left upper lateral and right  anterior medial. Anterior medial wound is small. He is tolerating this well. 7/22; still significant lymphedema even with 4-layer compression and external compression pump usage. He said he had some itching on the right anterior leg he comes in with a large superficial area of skin breakdown anteriorly and a smaller area laterally on the right leg. He also has a rash on the left dorsal ankle crease. Some of these look pustular. I wonder if this is an occlusion issue from foam we put down here. He also states the only thing different this week is the did a 5-hour car ride with his legs dependent. There was nothing open on the right leg last week but we wrapped it in any case 7/27; culture of the blisters on his left anterior leg grew staph aureus. I gave him doxycycline which he started yesterday. He is already appear cleaner and dry. His major wound is on the right anterior lower leg and the right lateral lower leg. There is nothing else open on the left leg. He has better edema control today. 03/04/20-Patient comes in with new areas after he had a fall, on his left dorsal foot and right dorsal foot and left medial proximal leg, there is significant amount of drainage into his dressing that was removed today which is actually greenish in color. It looks like he is completed a course of doxycycline for staph that grew from his blisters however he might need antipseudomonal coverage at this point 8/16; apparently the wounds originally were all healed until his last visit when after a fall he had areas on the left dorsal foot, right dorsal foot and left medial proximal leg. T oday all of these have healed and he has a small area on the left medial ankle. The patient has severe bilateral lymphedema which is managed usually with compression stockings/external compression stockings as well his mid abdomen compression pumps. He is still using the latter twice a day. 8/23; the wounds on his dorsal feet and  medial ankle that he had from a fall last time of closed however he has a new wrap injury on the left medial upper leg and a smaller 1 anteriorly. He is using his compression pumps twice a day 8/30; nothing open on the left leg. He is in his stocking. Using his compression pumps twice a day. From last week still a small opening in the medial upper leg on the right he has a new area in the upper mid tibia. He thinks this might have been a wrap injury looks more like the remanent of  the blister 9/7; the areas on the right anterior and right medial lower leg have both closed over. He came in for a nurse visit today but I looked at the right leg and indeed it is healed. His edema control is moderate but certainly a lot better than when I first saw this. He is using his compression pumps twice a day over his juxta lite stockings Objective Constitutional Vitals Time Taken: 11:49 AM, Height: 67 in, Weight: 250 lbs, BMI: 39.2, Temperature: 98.2 F, Pulse: 77 bpm, Respiratory Rate: 19 breaths/min, Blood Pressure: 125/79 mmHg. Respiratory work of breathing is normal. Cardiovascular Lymphedema bilaterally. Edema control is moderate. Psychiatric appears at normal baseline. General Notes: Wound exam; nothing is open in the right anterior or right lateral lower leg or the dorsal foot. Everything is closed Integumentary (Hair, Skin) Wound #15 status is Open. Original cause of wound was Gradually Appeared. The wound is located on the Right,Proximal,Medial Lower Leg. The wound measures 0cm length x 0cm width x 0cm depth; 0cm^2 area and 0cm^3 volume. There is no tunneling or undermining noted. There is a none present amount of drainage noted. The wound margin is flat and intact. There is no granulation within the wound bed. There is no necrotic tissue within the wound bed. Wound #16 status is Open. Original cause of wound was Blister. The wound is located on the Right,Anterior Lower Leg. The wound measures 0cm  length x 0cm width x 0cm depth; 0cm^2 area and 0cm^3 volume. There is no tunneling or undermining noted. There is a none present amount of drainage noted. There is no granulation within the wound bed. There is no necrotic tissue within the wound bed. 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 - Lymphedema pump twice a day for 1 hour each time 1. I think the patient can be discharged to his own juxta lite stockings. 2. Skin integrity really depends on the compliance with the external compression pumps which she is doing over his stockings twice a day. Anything less than this will result in reopening Electronic Signature(s) Signed: 03/30/2020 5:12:55 PM By: Linton Ham MD Entered By: Linton Ham on 03/30/2020 13:24:03 -------------------------------------------------------------------------------- SuperBill Details Patient Name: Date of Service: Nicholas [name] DFO RD K. 03/30/2020 Medical Record Number: [id number] Patient Account Number: [id number] Date of Birth/Sex: Treating RN: 16-Jun-1946 (74 y.o. [name] Primary Care Provider: [name] Other Clinician: Referring Provider: Treating Provider/Extender: [name] in Treatment: 11 Diagnosis Coding ICD-10 Codes Code Description I89.0 Lymphedema, not elsewhere  classified L97.811 Non-pressure chronic ulcer of other part of right lower leg limited to breakdown of skin L97.821 Non-pressure chronic ulcer of other part of left lower leg limited to breakdown of skin Facility Procedures CPT4 Code: [id number] Description: 99213 - WOUND CARE VISIT-LEV 3 EST PT Modifier: Quantity: 1 Physician Procedures : CPT4 Code Description Modifier [id number] - WC PHYS LEVEL 3 - EST PT 1 ICD-10 Diagnosis  Description I89.0 Lymphedema, not elsewhere classified L97.811 Non-pressure chronic ulcer of other part of right lower leg limited to breakdown of skin L97.821  Non-pressure chronic ulcer of other part of left lower leg limited to breakdown of skin Quantity: Electronic Signature(s) Signed: 03/30/2020 5:12:55 PM By: Linton Ham MD Entered By: Linton Ham on 03/30/2020 13:24:19

## 2020-03-31 NOTE — Progress Notes (Signed)
Nicholas [name] ([id number]) , Visit Report for 03/30/2020 Arrival Information Details Patient Name: Date of Service: Nicholas Junes DFO RD K. 03/30/2020 11:15 A M Medical Record Number: [id number] Patient Account Number: [id number] Date of Birth/Sex: Treating RN: 15-Aug-1945 (73 y.o. [name]) Dolores Lory, Morey Hummingbird Primary Care Shadoe Bethel: [name] Other Clinician: Referring Kieffer Blatz: Treating Jarod Bozzo/Extender: [name] in Treatment: 11 Visit Information History Since Last Visit Added or deleted any medications: No Patient Arrived: Wheel Chair Any new allergies or adverse reactions: No Arrival Time: 11:45 Had a fall or experienced change in No Accompanied By: caregiver activities of daily living that may affect Transfer Assistance: Manual risk of falls: Patient Identification Verified: Yes Signs or symptoms of abuse/neglect since last visito No Secondary Verification Process Completed: Yes Hospitalized since last visit: No Patient Requires Transmission-Based Precautions: No Implantable device outside of the clinic excluding No Patient Has Alerts: Yes cellular tissue based products placed in the center Patient Alerts: ABI: [place] 07/2018 since last visit: Has Dressing in Place as Prescribed: Yes Pain Present Now: No Electronic Signature(s) Signed: 03/30/2020 12:03:37 PM By: Sandre Kitty Entered By: Sandre Kitty on 03/30/2020 11:45:36 -------------------------------------------------------------------------------- Clinic Level of Care Assessment Details Patient Name: Date of Service: Nicholas Junes DFO RD K. 03/30/2020 11:15 A M Medical Record Number: [id number] Patient Account Number: [id number] Date of Birth/Sex: Treating RN: May 13, 1946 (74 y.o. [name] Primary Care Karlis Cregg: [name] Other Clinician: Referring Ragna Kramlich: Treating Caoilainn Sacks/Extender: [name] in Treatment: 11 Clinic Level of Care Assessment Items TOOL  4 Quantity Score X- 1 0 Use when only an EandM is performed on FOLLOW-UP visit ASSESSMENTS - Nursing Assessment / Reassessment X- 1 10 Reassessment of Co-morbidities (includes updates in patient status) X- 1 5 Reassessment of Adherence to Treatment Plan ASSESSMENTS - Wound and Skin A ssessment / Reassessment X - Simple Wound Assessment / Reassessment - one wound 1 5 []  - 0 Complex Wound Assessment / Reassessment - multiple wounds []  - 0 Dermatologic / Skin Assessment (not related to wound area) ASSESSMENTS - Focused Assessment []  - 0 Circumferential Edema Measurements - multi extremities []  - 0 Nutritional Assessment / Counseling / Intervention X- 1 5 Lower Extremity Assessment (monofilament, tuning fork, pulses) []  - 0 Peripheral Arterial Disease Assessment (using hand held doppler) ASSESSMENTS - Ostomy and/or Continence Assessment and Care []  - 0 Incontinence Assessment and Management []  - 0 Ostomy Care Assessment and Management (repouching, etc.) PROCESS - Coordination of Care X - Simple Patient / Family Education for ongoing care 1 15 []  - 0 Complex (extensive) Patient / Family Education for ongoing care X- 1 10 Staff obtains Programmer, systems, Records, T Results / Process Orders est []  - 0 Staff telephones HHA, Nursing Homes / Clarify orders / etc []  - 0 Routine Transfer to another Facility (non-emergent condition) []  - 0 Routine Hospital Admission (non-emergent condition) []  - 0 New Admissions / Biomedical engineer / Ordering NPWT Apligraf, etc. , []  - 0 Emergency Hospital Admission (emergent condition) X- 1 10 Simple Discharge Coordination []  - 0 Complex (extensive) Discharge Coordination PROCESS - Special Needs []  - 0 Pediatric / Minor Patient Management []  - 0 Isolation Patient Management []  - 0 Hearing / Language / Visual special needs []  - 0 Assessment of Community assistance (transportation, D/C planning, etc.) []  - 0 Additional assistance / Altered  mentation []  - 0 Support Surface(s) Assessment (bed, cushion, seat, etc.) INTERVENTIONS - Wound Cleansing / Measurement X - Simple Wound Cleansing -  one wound 1 5 []  - 0 Complex Wound Cleansing - multiple wounds X- 1 5 Wound Imaging (photographs - any number of wounds) []  - 0 Wound Tracing (instead of photographs) X- 1 5 Simple Wound Measurement - one wound []  - 0 Complex Wound Measurement - multiple wounds INTERVENTIONS - Wound Dressings []  - 0 Small Wound Dressing one or multiple wounds []  - 0 Medium Wound Dressing one or multiple wounds []  - 0 Large Wound Dressing one or multiple wounds []  - 0 Application of Medications - topical []  - 0 Application of Medications - injection INTERVENTIONS - Miscellaneous []  - 0 External ear exam []  - 0 Specimen Collection (cultures, biopsies, blood, body fluids, etc.) []  - 0 Specimen(s) / Culture(s) sent or taken to Lab for analysis []  - 0 Patient Transfer (multiple staff / Civil Service fast streamer / Similar devices) []  - 0 Simple Staple / Suture removal (25 or less) []  - 0 Complex Staple / Suture removal (26 or more) []  - 0 Hypo / Hyperglycemic Management (close monitor of Blood Glucose) []  - 0 Ankle / Brachial Index (ABI) - do not check if billed separately X- 1 5 Vital Signs Has the patient been seen at the hospital within the last three years: Yes Total Score: 80 Level Of Care: New/Established - Level 3 Electronic Signature(s) Signed: 03/30/2020 5:32:29 PM By: Levan Hurst RN, BSN Entered By: Levan Hurst on 03/30/2020 12:31:54 -------------------------------------------------------------------------------- Multi Wound Chart Details Patient Name: Date of Service: Nicholas Junes DFO RD K. 03/30/2020 11:15 A M Medical Record Number: [id number] Patient Account Number: [id number] Date of Birth/Sex: Treating RN: 06/25/1946 (73 y.o. [name]) Carlene Coria Primary Care Susa Bones: [name] Other Clinician: Referring Elliemae Braman: Treating  Travone Georg/Extender: [name] in Treatment: 11 Vital Signs Height(in): 67 Pulse(bpm): 77 Weight(lbs): 250 Blood Pressure(mmHg): 125/79 Body Mass Index(BMI): 39 Temperature(F): 98.2 Respiratory Rate(breaths/min): 19 Photos: [15:No Photos Right, Proximal, Medial Lower Leg] [16:No Photos Right, Anterior Lower Leg] [N/A:N/A N/A] Wound Location: [15:Gradually Appeared] [16:Blister] [N/A:N/A] Wounding Event: [15:Lymphedema] [16:Lymphedema] [N/A:N/A] Primary Etiology: [15:Lymphedema, Sleep Apnea,] [16:Lymphedema, Sleep Apnea,] [N/A:N/A] Comorbid History: [15:Congestive Heart Failure, Hypertension, Peripheral Venous Disease, Received Chemotherapy 03/15/2020] [16:Congestive Heart Failure, Hypertension, Peripheral Venous Disease, Received Chemotherapy 03/22/2020] [N/A:N/A] Date Acquired: [15:2] [16:1] [N/A:N/A] Weeks of Treatment: [15:Open] [16:Open] [N/A:N/A] Wound Status: [15:0x0x0] [16:0x0x0] [N/A:N/A] Measurements L x W x D (cm) [15:0] [16:0] [N/A:N/A] A (cm) : rea [15:0] [16:0] [N/A:N/A] Volume (cm) : [15:100.00%] [16:100.00%] [N/A:N/A] % Reduction in Area: [15:100.00%] [16:100.00%] [N/A:N/A] % Reduction in Volume: [15:Full Thickness Without Exposed] [16:Partial Thickness] [N/A:N/A] Classification: [15:Support Structures None Present] [16:None Present] [N/A:N/A] Exudate Amount: [15:Flat and Intact] [16:N/A] [N/A:N/A] Wound Margin: [15:None Present (0%)] [16:None Present (0%)] [N/A:N/A] Granulation Amount: [15:None Present (0%)] [16:None Present (0%)] [N/A:N/A] Necrotic Amount: [15:Fascia: No] [16:Fascia: No] [N/A:N/A] Exposed Structures: [15:Fat Layer (Subcutaneous Tissue): No Tendon: No Muscle: No Joint: No Bone: No Large (67-100%)] [16:Fat Layer (Subcutaneous Tissue): No Tendon: No Muscle: No Joint: No Bone: No Large (67-100%)] [N/A:N/A] Treatment Notes Electronic Signature(s) Signed: 03/30/2020 5:12:55 PM By: Linton Ham MD Signed: 03/31/2020 5:14:09 PM  By: Carlene Coria RN Entered By: Linton Ham on 03/30/2020 13:20:07 -------------------------------------------------------------------------------- Multi-Disciplinary Care Plan Details Patient Name: Date of Service: Nicholas Junes DFO RD K. 03/30/2020 11:15 A M Medical Record Number: [id number] Patient Account Number: [id number] Date of Birth/Sex: Treating RN: 06-18-1946 (74 y.o. [name] Primary Care Linda Grimmer: [name] Other Clinician: Referring Aprille Sawhney: Treating Jaciel Diem/Extender: [name] in Treatment: 11 Active Inactive  Electronic Signature(s) Signed: 03/30/2020 5:32:29 PM By: Levan Hurst RN, BSN Entered By: Levan Hurst on 03/30/2020 12:30:50 -------------------------------------------------------------------------------- Pain Assessment Details Patient Name: Date of Service: Nicholas Junes DFO RD K. 03/30/2020 11:15 A M Medical Record Number: [id number] Patient Account Number: [id number] Date of Birth/Sex: Treating RN: [birth year] (73 y.o. [name]) Carlene Coria Primary Care Rhylei Mcquaig: [name] Other Clinician: Referring Treacy Holcomb: Treating Nasean Zapf/Extender: [name] in Treatment: 11 Active Problems Location of Pain Severity and Description of Pain Patient Has Paino No Site Locations Pain Management and Medication Current Pain Management: Electronic Signature(s) Signed: 03/31/2020 1:03:45 PM By: Sandre Kitty Signed: 03/31/2020 5:14:09 PM By: Carlene Coria RN Entered By: Sandre Kitty on 03/30/2020 12:03:55 -------------------------------------------------------------------------------- Patient/Caregiver Education Details Patient Name: Date of Service: Nicholas Junes DFO RD K. 9/7/2021andnbsp11:15 A M Medical Record Number: [id number] Patient Account Number: [id number] Date of Birth/Gender: Treating RN: [birth year] (74 y.o. [name] Primary Care Physician: [name] Other  Clinician: Referring Physician: Treating Physician/Extender: [name] in Treatment: 11 Education Assessment Education Provided To: Patient Education Topics Provided Wound/Skin Impairment: Methods: Explain/Verbal Responses: State content correctly [employer]) Signed: 03/30/2020 5:32:29 PM By: Levan Hurst RN, BSN Entered By: Levan Hurst on 03/30/2020 12:31:01 -------------------------------------------------------------------------------- Wound Assessment Details Patient Name: Date of Service: Nicholas Junes DFO RD K. 03/30/2020 11:15 A M Medical Record Number: [id number] Patient Account Number: [id number] Date of Birth/Sex: Treating RN: [birth year] (73 y.o. [name] Primary Care Theus Espin: [name] Other Clinician: Referring Falana Clagg: Treating Cleatis Fandrich/Extender: [name] in Treatment: 11 Wound Status Wound Number: 15 Primary Lymphedema Etiology: Wound Location: Right, Proximal, Medial Lower Leg Wound Open Wounding Event: Gradually Appeared Status: Date Acquired: 03/15/2020 Comorbid Lymphedema, Sleep Apnea, Congestive Heart Failure, Weeks Of Treatment: 2 History: Hypertension, Peripheral Venous Disease, Received Chemotherapy Clustered Wound: No Wound Measurements Length: (cm) Width: (cm) Depth: (cm) Area: (cm) Volume: (cm) 0 % Reduction in Area: 100% 0 % Reduction in Volume: 100% 0 Epithelialization: Large (67-100%) 0 Tunneling: No 0 Undermining: No Wound Description Classification: Full Thickness Without Exposed Support Structures Wound Margin: Flat and Intact Exudate Amount: None Present Foul Odor After Cleansing: No Slough/Fibrino No Wound Bed Granulation Amount: None Present (0%) Exposed Structure Necrotic Amount: None Present (0%) Fascia Exposed: No Fat Layer (Subcutaneous Tissue) Exposed: No Tendon Exposed: No Muscle Exposed: No Joint Exposed: No Bone Exposed:  No Electronic Signature(s) Signed: 03/30/2020 5:32:29 PM By: Levan Hurst RN, BSN Previous Signature: 03/30/2020 12:03:37 PM Version By: Sandre Kitty Entered By: Levan Hurst on 03/30/2020 12:32:45 -------------------------------------------------------------------------------- Wound Assessment Details Patient Name: Date of Service: Nicholas Junes DFO RD K. 03/30/2020 11:15 A M Medical Record Number: [id number] Patient Account Number: [id number] Date of Birth/Sex: Treating RN: 1945/08/15 (74 y.o. [name] Primary Care Berdie Malter: [name] Other Clinician: Referring Emari Hreha: Treating Odin Mariani/Extender: [name] in Treatment: 11 Wound Status Wound Number: 16 Primary Lymphedema Etiology: Wound Location: Right, Anterior Lower Leg Wound Open Wounding Event: Blister Status: Date Acquired: 03/22/2020 Comorbid Lymphedema, Sleep Apnea, Congestive Heart Failure, Weeks Of Treatment: 1 History: Hypertension, Peripheral Venous Disease, Received Chemotherapy Clustered Wound: No Wound Measurements Length: (cm) Width: (cm) Depth: (cm) Area: (cm) Volume: (cm) 0 % Reduction in Area: 100% 0 % Reduction in Volume: 100% 0 Epithelialization: Large (67-100%) 0 Tunneling: No 0 Undermining: No Wound Description Classification: Partial Thickness Exudate Amount: None Present Foul Odor After Cleansing: No Slough/Fibrino No Wound Bed Granulation Amount: None Present (  0%) Exposed Structure Necrotic Amount: None Present (0%) Fascia Exposed: No Fat Layer (Subcutaneous Tissue) Exposed: No Tendon Exposed: No Muscle Exposed: No Joint Exposed: No Bone Exposed: No Electronic Signature(s) Signed: 03/30/2020 5:32:29 PM By: Levan Hurst RN, BSN Previous Signature: 03/30/2020 12:03:37 PM Version By: Sandre Kitty Entered By: Levan Hurst on 03/30/2020 12:33:07 -------------------------------------------------------------------------------- Vitals  Details Patient Name: Date of Service: Nicholas Junes DFO RD K. 03/30/2020 11:15 A M Medical Record Number: [id number] Patient Account Number: [id number] Date of Birth/Sex: Treating RN: 04-21-1946 (73 y.o. [name]) Carlene Coria Primary Care Cailan Antonucci: [name] Other Clinician: Referring Lya Holben: Treating Yalena Colon/Extender: [name] in Treatment: 11 Vital Signs Time Taken: 11:49 Temperature (F): 98.2 Height (in): 67 Pulse (bpm): 77 Weight (lbs): 250 Respiratory Rate (breaths/min): 19 Body Mass Index (BMI): 39.2 Blood Pressure (mmHg): 125/79 Reference Range: 80 - 120 mg / dl Electronic Signature(s) Signed: 03/30/2020 12:03:37 PM By: Sandre Kitty Entered By: Sandre Kitty on 03/30/2020 11:49:23

## 2020-04-01 ENCOUNTER — Encounter: Payer: Self-pay | Admitting: Family Medicine

## 2020-04-05 ENCOUNTER — Encounter (HOSPITAL_BASED_OUTPATIENT_CLINIC_OR_DEPARTMENT_OTHER): Payer: PPO | Admitting: Internal Medicine

## 2020-04-05 ENCOUNTER — Other Ambulatory Visit: Payer: Self-pay | Admitting: Family Medicine

## 2020-04-06 ENCOUNTER — Other Ambulatory Visit: Payer: Self-pay

## 2020-04-06 ENCOUNTER — Encounter (HOSPITAL_BASED_OUTPATIENT_CLINIC_OR_DEPARTMENT_OTHER): Payer: PPO | Admitting: Internal Medicine

## 2020-04-06 DIAGNOSIS — L97811 Non-pressure chronic ulcer of other part of right lower leg limited to breakdown of skin: Secondary | ICD-10-CM | POA: Diagnosis not present

## 2020-04-06 DIAGNOSIS — I890 Lymphedema, not elsewhere classified: Secondary | ICD-10-CM | POA: Diagnosis not present

## 2020-04-06 DIAGNOSIS — L97812 Non-pressure chronic ulcer of other part of right lower leg with fat layer exposed: Secondary | ICD-10-CM | POA: Diagnosis not present

## 2020-04-06 NOTE — Telephone Encounter (Signed)
[place], Please advise.

## 2020-04-07 ENCOUNTER — Other Ambulatory Visit: Payer: Self-pay

## 2020-04-07 DIAGNOSIS — G232 Striatonigral degeneration: Secondary | ICD-10-CM

## 2020-04-12 ENCOUNTER — Inpatient Hospital Stay (HOSPITAL_COMMUNITY)
Admission: EM | Admit: 2020-04-12 | Discharge: 2020-04-15 | DRG: 603 | Disposition: A | Discharge status: Home / Self Care | Payer: PPO | Source: Ambulatory Visit | Attending: Internal Medicine | Admitting: Internal Medicine

## 2020-04-12 ENCOUNTER — Emergency Department (HOSPITAL_COMMUNITY): Payer: PPO

## 2020-04-12 ENCOUNTER — Encounter (HOSPITAL_COMMUNITY): Payer: Self-pay

## 2020-04-12 ENCOUNTER — Other Ambulatory Visit: Payer: Self-pay

## 2020-04-12 ENCOUNTER — Encounter (HOSPITAL_BASED_OUTPATIENT_CLINIC_OR_DEPARTMENT_OTHER): Payer: PPO | Admitting: Internal Medicine

## 2020-04-12 DIAGNOSIS — L0390 Cellulitis, unspecified: Secondary | ICD-10-CM | POA: Diagnosis present

## 2020-04-12 DIAGNOSIS — Z823 Family history of stroke: Secondary | ICD-10-CM | POA: Diagnosis not present

## 2020-04-12 DIAGNOSIS — L97312 Non-pressure chronic ulcer of right ankle with fat layer exposed: Secondary | ICD-10-CM | POA: Diagnosis not present

## 2020-04-12 DIAGNOSIS — Z87891 Personal history of nicotine dependence: Secondary | ICD-10-CM | POA: Diagnosis not present

## 2020-04-12 DIAGNOSIS — Z89422 Acquired absence of other left toe(s): Secondary | ICD-10-CM | POA: Diagnosis not present

## 2020-04-12 DIAGNOSIS — E669 Obesity, unspecified: Secondary | ICD-10-CM | POA: Diagnosis present

## 2020-04-12 DIAGNOSIS — Z8249 Family history of ischemic heart disease and other diseases of the circulatory system: Secondary | ICD-10-CM

## 2020-04-12 DIAGNOSIS — L97812 Non-pressure chronic ulcer of other part of right lower leg with fat layer exposed: Secondary | ICD-10-CM | POA: Diagnosis not present

## 2020-04-12 DIAGNOSIS — G232 Striatonigral degeneration: Secondary | ICD-10-CM

## 2020-04-12 DIAGNOSIS — Z85820 Personal history of malignant melanoma of skin: Secondary | ICD-10-CM

## 2020-04-12 DIAGNOSIS — Z23 Encounter for immunization: Secondary | ICD-10-CM

## 2020-04-12 DIAGNOSIS — I10 Essential (primary) hypertension: Secondary | ICD-10-CM | POA: Diagnosis present

## 2020-04-12 DIAGNOSIS — E785 Hyperlipidemia, unspecified: Secondary | ICD-10-CM | POA: Diagnosis present

## 2020-04-12 DIAGNOSIS — Z20822 Contact with and (suspected) exposure to covid-19: Secondary | ICD-10-CM | POA: Diagnosis not present

## 2020-04-12 DIAGNOSIS — M7989 Other specified soft tissue disorders: Secondary | ICD-10-CM | POA: Diagnosis not present

## 2020-04-12 DIAGNOSIS — G20 Parkinson's disease: Secondary | ICD-10-CM | POA: Diagnosis present

## 2020-04-12 DIAGNOSIS — L03115 Cellulitis of right lower limb: Secondary | ICD-10-CM | POA: Diagnosis not present

## 2020-04-12 DIAGNOSIS — I890 Lymphedema, not elsewhere classified: Secondary | ICD-10-CM | POA: Diagnosis present

## 2020-04-12 DIAGNOSIS — Z6839 Body mass index (BMI) 39.0-39.9, adult: Secondary | ICD-10-CM

## 2020-04-12 DIAGNOSIS — L97919 Non-pressure chronic ulcer of unspecified part of right lower leg with unspecified severity: Secondary | ICD-10-CM | POA: Diagnosis not present

## 2020-04-12 DIAGNOSIS — Z66 Do not resuscitate: Secondary | ICD-10-CM | POA: Diagnosis not present

## 2020-04-12 DIAGNOSIS — L97512 Non-pressure chronic ulcer of other part of right foot with fat layer exposed: Secondary | ICD-10-CM | POA: Diagnosis not present

## 2020-04-12 LAB — URINALYSIS, ROUTINE W REFLEX MICROSCOPIC
Bilirubin Urine: NEGATIVE
Glucose, UA: NEGATIVE mg/dL
Hgb urine dipstick: NEGATIVE
Ketones, ur: 5 mg/dL — AB
Leukocytes,Ua: NEGATIVE
Nitrite: NEGATIVE
Protein, ur: NEGATIVE mg/dL
Specific Gravity, Urine: 1.035 — ABNORMAL HIGH (ref 1.005–1.030)
pH: 5 (ref 5.0–8.0)

## 2020-04-12 LAB — CBC WITH DIFFERENTIAL/PLATELET
Abs Immature Granulocytes: 0.03 10*3/uL (ref 0.00–0.07)
Basophils Absolute: 0 10*3/uL (ref 0.0–0.1)
Basophils Relative: 0 %
Eosinophils Absolute: 0.3 10*3/uL (ref 0.0–0.5)
Eosinophils Relative: 3 %
HCT: 47.5 % (ref 39.0–52.0)
Hemoglobin: 15.1 g/dL (ref 13.0–17.0)
Immature Granulocytes: 0 %
Lymphocytes Relative: 17 %
Lymphs Abs: 1.8 10*3/uL (ref 0.7–4.0)
MCH: 30.8 pg (ref 26.0–34.0)
MCHC: 31.8 g/dL (ref 30.0–36.0)
MCV: 96.7 fL (ref 80.0–100.0)
Monocytes Absolute: 0.8 10*3/uL (ref 0.1–1.0)
Monocytes Relative: 8 %
Neutro Abs: 7.6 10*3/uL (ref 1.7–7.7)
Neutrophils Relative %: 72 %
Platelets: 190 10*3/uL (ref 150–400)
RBC: 4.91 MIL/uL (ref 4.22–5.81)
RDW: 13.8 % (ref 11.5–15.5)
WBC: 10.6 10*3/uL — ABNORMAL HIGH (ref 4.0–10.5)
nRBC: 0 % (ref 0.0–0.2)

## 2020-04-12 LAB — COMPREHENSIVE METABOLIC PANEL
ALT: <5 U/L (ref 0–44)
AST: 13 U/L — ABNORMAL LOW (ref 15–41)
Albumin: 3.8 g/dL (ref 3.5–5.0)
Alkaline Phosphatase: 63 U/L (ref 38–126)
Anion gap: 13 (ref 5–15)
BUN: 26 mg/dL — ABNORMAL HIGH (ref 8–23)
CO2: 28 mmol/L (ref 22–32)
Calcium: 9.3 mg/dL (ref 8.9–10.3)
Chloride: 103 mmol/L (ref 98–111)
Creatinine, Ser: 0.83 mg/dL (ref 0.61–1.24)
GFR calc Af Amer: >60 mL/min (ref >60)
GFR calc non Af Amer: >60 mL/min (ref >60)
Glucose, Bld: 96 mg/dL (ref 70–99)
Potassium: 4.2 mmol/L (ref 3.5–5.1)
Sodium: 144 mmol/L (ref 135–145)
Total Bilirubin: 0.5 mg/dL (ref 0.3–1.2)
Total Protein: 7.4 g/dL (ref 6.5–8.1)

## 2020-04-12 LAB — LACTIC ACID, PLASMA: Lactic Acid, Venous: 1.3 mmol/L (ref 0.5–1.9)

## 2020-04-12 LAB — SARS CORONAVIRUS 2 BY RT PCR (HOSPITAL ORDER, PERFORMED IN ~~LOC~~ HOSPITAL LAB): SARS Coronavirus 2: NEGATIVE

## 2020-04-12 MED ORDER — CARBIDOPA-LEVODOPA 25-100 MG PO TABS
2.0000 | ORAL_TABLET | ORAL | Status: DC
  Administered 2020-04-12 – 2020-04-15 (×11): 2 via ORAL
  Filled 2020-04-12 (×11): qty 2

## 2020-04-12 MED ORDER — ENOXAPARIN SODIUM 60 MG/0.6ML ~~LOC~~ SOLN
55.0000 mg | SUBCUTANEOUS | Status: DC
  Administered 2020-04-12 – 2020-04-14 (×3): 55 mg via SUBCUTANEOUS
  Filled 2020-04-12 (×3): qty 0.6

## 2020-04-12 MED ORDER — VANCOMYCIN HCL 2000 MG/400ML IV SOLN
2000.0000 mg | Freq: Once | INTRAVENOUS | Status: AC
  Administered 2020-04-12: 2000 mg via INTRAVENOUS
  Filled 2020-04-12: qty 400

## 2020-04-12 MED ORDER — MORPHINE SULFATE (PF) 2 MG/ML IV SOLN
1.0000 mg | INTRAVENOUS | Status: DC | PRN
  Administered 2020-04-12 – 2020-04-14 (×3): 1 mg via INTRAVENOUS
  Filled 2020-04-12 (×4): qty 1

## 2020-04-12 MED ORDER — ROSUVASTATIN CALCIUM 10 MG PO TABS
10.0000 mg | ORAL_TABLET | Freq: Every day | ORAL | Status: DC
  Administered 2020-04-12 – 2020-04-15 (×4): 10 mg via ORAL
  Filled 2020-04-12 (×5): qty 1

## 2020-04-12 MED ORDER — INFLUENZA VAC A&B SA ADJ QUAD 0.5 ML IM PRSY
0.5000 mL | PREFILLED_SYRINGE | INTRAMUSCULAR | Status: AC
  Administered 2020-04-13: 0.5 mL via INTRAMUSCULAR
  Filled 2020-04-12: qty 0.5

## 2020-04-12 MED ORDER — SODIUM CHLORIDE 0.9 % IV SOLN
2.0000 g | Freq: Once | INTRAVENOUS | Status: AC
  Administered 2020-04-12: 2 g via INTRAVENOUS
  Filled 2020-04-12: qty 2

## 2020-04-12 MED ORDER — ACETAMINOPHEN 325 MG PO TABS
650.0000 mg | ORAL_TABLET | Freq: Four times a day (QID) | ORAL | Status: DC | PRN
  Administered 2020-04-12 – 2020-04-15 (×7): 650 mg via ORAL
  Filled 2020-04-12 (×7): qty 2

## 2020-04-12 MED ORDER — SODIUM CHLORIDE 0.9 % IV SOLN
INTRAVENOUS | Status: DC | PRN
  Administered 2020-04-12: 250 mL via INTRAVENOUS

## 2020-04-12 MED ORDER — ACETAMINOPHEN 325 MG PO TABS
650.0000 mg | ORAL_TABLET | Freq: Once | ORAL | Status: AC
  Administered 2020-04-12: 650 mg via ORAL
  Filled 2020-04-12: qty 2

## 2020-04-12 MED ORDER — SODIUM CHLORIDE 0.9 % IV SOLN
2.0000 g | Freq: Three times a day (TID) | INTRAVENOUS | Status: DC
  Administered 2020-04-13 – 2020-04-15 (×8): 2 g via INTRAVENOUS
  Filled 2020-04-12 (×10): qty 2

## 2020-04-12 NOTE — Progress Notes (Signed)
Nicholas [name] ([id number]) , Visit Report for 04/06/2020 Arrival Information Details Patient Name: Date of Service: Nicholas [name] DFO RD K. 04/06/2020 2:15 PM Medical Record Number: [id number] Patient Account Number: [id number] Date of Birth/Sex: Treating RN: 1945-08-05 (74 y.o. Nicholas [name], Nicholas [name] Primary Care Norvella Loscalzo: Nicholas [name] Other Clinician: Referring Nicholas [name]: Treating Nicholas [name]/Extender: Nicholas [name] in Treatment: 12 Visit Information History Since Last Visit Added or deleted any medications: No Patient Arrived: Wheel Chair Any new allergies or adverse reactions: No Arrival Time: 14:29 Had a fall or experienced change in No Accompanied By: caregiver activities of daily living that may affect Transfer Assistance: None risk of falls: Patient Identification Verified: Yes Signs or symptoms of abuse/neglect since last visito No Secondary Verification Process Completed: Yes Hospitalized since last visit: No Patient Requires Transmission-Based Precautions: No Implantable device outside of the clinic excluding No Patient Has Alerts: Yes cellular tissue based products placed in the center Patient Alerts: ABI: [place] 07/2018 since last visit: Has Dressing in Place as Prescribed: Yes Has Compression in Place as Prescribed: Yes Pain Present Now: Yes Electronic Signature(s) Signed: 04/06/2020 5:02:37 PM By: Baruch Gouty RN, BSN Entered By: Baruch Gouty on 04/06/2020 14:34:28 -------------------------------------------------------------------------------- Compression Therapy Details Patient Name: Date of Service: Nicholas [name] DFO RD K. 04/06/2020 2:15 PM Medical Record Number: [id number] Patient Account Number: [id number] Date of Birth/Sex: Treating RN: 03-12-1946 (74 y.o. Nicholas [name] Primary Care Nicholas [name]: Nicholas [name] Other Clinician: Referring Saryiah Bencosme: Treating Nicholas [name]/Extender: Nicholas [name] in Treatment:  12 Compression Therapy Performed for Wound Assessment: Wound #17 Right,Lateral Lower Leg Performed By: Clinician Nicholas Coria, RN Compression Type: Four Layer Post Procedure Diagnosis Same as Pre-procedure Electronic Signature(s) Signed: 04/12/2020 1:15:48 PM By: Nicholas Coria RN Entered By: Nicholas [name] on 04/06/2020 15:23:33 -------------------------------------------------------------------------------- Compression Therapy Details Patient Name: Date of Service: Nicholas [name] DFO RD K. 04/06/2020 2:15 PM Medical Record Number: [id number] Patient Account Number: [id number] Date of Birth/Sex: Treating RN: 09-Apr-1946 (74 y.o. Nicholas [name] Primary Care Ariston Grandison: Nicholas [name] Other Clinician: Referring Nicholas [name]: Treating Nicholas [name]/Extender: Nicholas [name] in Treatment: 12 Compression Therapy Performed for Wound Assessment: Wound #18 Right,Lateral Ankle Performed By: Clinician Nicholas Coria, RN Compression Type: Four Layer Post Procedure Diagnosis Same as Pre-procedure Electronic Signature(s) Signed: 04/12/2020 1:15:48 PM By: Nicholas Coria RN Entered By: Nicholas [name] on 04/06/2020 15:23:33 -------------------------------------------------------------------------------- Compression Therapy Details Patient Name: Date of Service: Nicholas [name] DFO RD K. 04/06/2020 2:15 PM Medical Record Number: [id number] Patient Account Number: [id number] Date of Birth/Sex: Treating RN: [birth year] (74 y.o. Nicholas [name] Primary Care Alva Kuenzel: Nicholas [name] Other Clinician: Referring Rees Santistevan: Treating Nicholas [name]/Extender: Nicholas [name] in Treatment: 12 Compression Therapy Performed for Wound Assessment: Wound #19 Right,Medial,Dorsal Foot Performed By: Clinician Nicholas Coria, RN Compression Type: Four Layer Post Procedure Diagnosis Same as Pre-procedure Electronic Signature(s) Signed: 04/12/2020 1:15:48 PM By: Nicholas Coria RN Entered By: Nicholas [name] on 04/06/2020 15:23:33 -------------------------------------------------------------------------------- Encounter Discharge Information Details Patient Name: Date of Service: Nicholas [name] DFO RD K. 04/06/2020 2:15 PM Medical Record Number: [id number] Patient Account Number: [id number] Date of Birth/Sex: Treating RN: 1945/09/22 (74 y.o. Nicholas [name] Primary Care Nicholas [name]: Nicholas [name] Other Clinician: Referring Nicholas [name]: Treating Nicholas [name]/Extender: Nicholas [name] in Treatment: 12 Encounter Discharge Information Items Discharge Condition: Stable Ambulatory Status: Wheelchair Discharge Destination: Home Transportation: Private Auto Accompanied By: caregiver Schedule Follow-up Appointment: Yes Clinical Summary of Care: Patient Declined Electronic Signature(s) Signed:  04/08/2020 4:34:39 PM By: Nicholas [name] Entered By: Nicholas [name] on 04/06/2020 16:05:12 -------------------------------------------------------------------------------- Lower Extremity Assessment Details Patient Name: Date of Service: Nicholas [name] DFO RD K. 04/06/2020 2:15 PM Medical Record Number: [id number] Patient Account Number: [id number] Date of Birth/Sex: Treating RN: 1946/01/14 (74 y.o. Nicholas [name] Primary Care Nicholas [name]: Nicholas [name] Other Clinician: Referring Nicholas [name]: Treating Nicholas [name]/Extender: Nicholas [name] in Treatment: 12 Edema Assessment Assessed: [name]: No] Nicholas [name]: No] Edema: [Left: Ye] [Right: s] Calf Left: Right: Point of Measurement: 40 cm From Medial Instep cm 52.5 cm Ankle Left: Right: Point of Measurement: 19 cm From Medial Instep cm 40 cm Vascular Assessment Pulses: Dorsalis Pedis Palpable: [Right:No] Electronic Signature(s) Signed: 04/06/2020 5:02:37 PM By: Baruch Gouty RN, BSN Entered By: Baruch Gouty on 04/06/2020  14:41:54 -------------------------------------------------------------------------------- Multi Wound Chart Details Patient Name: Date of Service: Nicholas [name] DFO RD K. 04/06/2020 2:15 PM Medical Record Number: [id number] Patient Account Number: [id number] Date of Birth/Sex: Treating RN: 07-Oct-1945 (74 y.o. Nicholas [name]) Nicholas [name] Primary Care Shaquinta Peruski: Nicholas [name] Other Clinician: Referring Jovonta Levit: Treating Syrah Daughtrey/Extender: Nicholas [name] in Treatment: 12 Vital Signs Height(in): 84 Pulse(bpm): 38 Weight(lbs): 250 Blood Pressure(mmHg): 138/67 Body Mass Index(BMI): 39 Temperature(F): 97.8 Respiratory Rate(breaths/min): 18 Photos: [17:No Photos Right, Lateral Lower Leg] [18:No Photos Right, Lateral Ankle] [19:No Photos Right, Medial, Dorsal Foot] Wound Location: [17:Gradually Appeared] [18:Gradually Appeared] [19:Blister] Wounding Event: [17:Lymphedema] [18:Lymphedema] [19:Lymphedema] Primary Etiology: [17:Lymphedema, Sleep Apnea,] [18:Lymphedema, Sleep Apnea,] [19:Lymphedema, Sleep Apnea,] Comorbid History: [17:Congestive Heart Failure, Hypertension, Peripheral Venous Disease, Received Chemotherapy 04/03/2020] [18:Congestive Heart Failure, Hypertension, Peripheral Venous Disease, Received Chemotherapy 04/03/2020] [19:Congestive Heart  Failure, Hypertension, Peripheral Venous Disease, Received Chemotherapy 04/03/2020] Date Acquired: [17:0] [18:0] [19:0] Weeks of Treatment: [17:Open] [18:Open] [19:Open] Wound Status: [17:5.5x5.5x0.1] [18:4x3.5x0.1] [19:0.5x2x0.1] Measurements L x W x D (cm) [17:23.758] [18:10.996] [19:0.785] A (cm) : rea [17:2.376] [18:1.1] [19:0.079] Volume (cm) : [17:0.00%] [18:N/A] [19:N/A] % Reduction in Area: [17:0.00%] [18:N/A] [19:N/A] % Reduction in Volume: [17:Full Thickness Without Exposed] [18:Partial Thickness] [19:Partial Thickness] Classification: [17:Support Structures Large] [18:Large] [19:Large] Exudate Amount: [17:Serous]  [18:Serous] [19:Serous] Exudate Type: [17:amber] [18:amber] [19:amber] Exudate Color: [17:Flat and Intact] [18:Indistinct, nonvisible] [19:Flat and Intact] Wound Margin: [17:Medium (34-66%)] [18:Small (1-33%)] [19:Small (1-33%)] Granulation Amount: [17:Red] [18:Pink] [19:Pink] Granulation Quality: [17:Medium (34-66%)] [18:None Present (0%)] [19:None Present (0%)] Necrotic Amount: [17:Fat Layer (Subcutaneous Tissue): Yes Fascia: No] [19:Fascia: No] Exposed Structures: [17:Fascia: No Tendon: No Muscle: No Joint: No Bone: No Small (1-33%)] [18:Fat Layer (Subcutaneous Tissue): No Tendon: No Muscle: No Joint: No Bone: No Limited to Skin Breakdown Small (1-33%)] [19:Fat Layer (Subcutaneous Tissue): No Tendon: No Muscle: No  Joint: No Bone: No Limited to Skin Breakdown Small (1-33%)] Epithelialization: [17:Compression Therapy] [18:Compression Therapy] [19:Compression Therapy] Treatment Notes Wound #17 (Right, Lateral Lower Leg) 1. Cleanse With Wound Cleanser Soap and water 3. Primary Dressing Applied Calcium Alginate Ag 4. Secondary Dressing ABD Pad Kerramax/Xtrasorb 6. Support Layer Applied 4 layer compression wrap Notes stockinette. unna boot to top and foot to anchor Wound #18 (Right, Lateral Ankle) 1. Cleanse With Wound Cleanser Soap and water 3. Primary Dressing Applied Calcium Alginate Ag 4. Secondary Dressing ABD Pad Kerramax/Xtrasorb 6. Support Layer Applied 4 layer compression wrap Notes stockinette. unna boot to top and foot to anchor Wound #19 (Right, Medial, Dorsal Foot) 1. Cleanse With Wound Cleanser Soap and water 3. Primary Dressing Applied Calcium Alginate Ag 4. Secondary Dressing ABD Pad Kerramax/Xtrasorb 6. Support Layer Applied 4 layer compression wrap Notes stockinette. unna boot to top  and foot to anchor Electronic Signature(s) Signed: 04/06/2020 5:12:45 PM By: Linton Ham MD Signed: 04/12/2020 1:15:48 PM By: Nicholas Coria RN Entered By: Linton Ham on 04/06/2020 17:05:07 -------------------------------------------------------------------------------- Multi-Disciplinary Care Plan Details Patient Name: Date of Service: Nicholas [name] DFO RD K. 04/06/2020 2:15 PM Medical Record Number: [id number] Patient Account Number: [id number] Date of Birth/Sex: Treating RN: [birth year] (74 y.o. Nicholas [name] Primary Care Jaggar Benko: Nicholas [name] Other Clinician: Referring Reka Wist: Treating Didier Brandenburg/Extender: Nicholas [name] in Treatment: 12 Active Inactive Wound/Skin Impairment Nursing Diagnoses: Knowledge deficit related to ulceration/compromised skin integrity Goals: Patient/caregiver will verbalize understanding of skin care regimen Date Initiated: 01/08/2020 Target Resolution Date: 05/03/2020 Goal Status: Active Ulcer/skin breakdown will have a volume reduction of 30% by week 4 Date Initiated: 01/08/2020 Date Inactivated: 02/05/2020 Target Resolution Date: 01/30/2020 Goal Status: Met Interventions: Assess patient/caregiver ability to obtain necessary supplies Assess patient/caregiver ability to perform ulcer/skin care regimen upon admission and as needed Provide education on ulcer and skin care Treatment Activities: Skin care regimen initiated : 01/08/2020 Topical wound management initiated : 01/08/2020 Notes: Electronic Signature(s) Signed: 04/12/2020 1:15:48 PM By: Nicholas Coria RN Entered By: Nicholas [name] on 04/06/2020 14:48:53 -------------------------------------------------------------------------------- Pain Assessment Details Patient Name: Date of Service: Nicholas [name] DFO RD K. 04/06/2020 2:15 PM Medical Record Number: [id number] Patient Account Number: [id number] Date of Birth/Sex: Treating RN: 1946/03/06 (74 y.o. Nicholas [name] Primary Care Benoit Meech: Nicholas [name] Other Clinician: Referring Jannine Abreu: Treating Aaro Meyers/Extender: Nicholas [name] in Treatment:  12 Active Problems Location of Pain Severity and Description of Pain Patient Has Paino No Site Locations With Dressing Change: Yes Duration of the Pain. Constant / Intermittento Intermittent Rate the pain. Current Pain Level: 0 Character of Pain Describe the Pain: Tender Pain Management and Medication Current Pain Management: Electronic Signature(s) Signed: 04/06/2020 5:02:37 PM By: Baruch Gouty RN, BSN Entered By: Baruch Gouty on 04/06/2020 14:35:00 -------------------------------------------------------------------------------- Patient/Caregiver Education Details Patient Name: Date of Service: Nicholas [name] DFO RD K. 9/14/2021andnbsp2:15 PM Medical Record Number: [id number] Patient Account Number: [id number] Date of Birth/Gender: Treating RN: 04/09/1946 (74 y.o. Nicholas [name] Primary Care Physician: Nicholas [name] Other Clinician: Referring Physician: Treating Physician/Extender: Nicholas [name] in Treatment: 12 Education Assessment Education Provided To: Patient Education Topics Provided Wound/Skin Impairment: Methods: Explain/Verbal Responses: State content correctly Electronic Signature(s) Signed: 04/12/2020 1:15:48 PM By: Nicholas Coria RN Entered By: Nicholas [name] on 04/06/2020 14:49:12 -------------------------------------------------------------------------------- Wound Assessment Details Patient Name: Date of Service: Nicholas [name] DFO RD K. 04/06/2020 2:15 PM Medical Record Number: [id number] Patient Account Number: [id number] Date of Birth/Sex: Treating RN: May 06, 1946 (74 y.o. Nicholas [name] Primary Care Jalia Zuniga: Nicholas [name] Other Clinician: Referring Kannan Proia: Treating Jarell Mcewen/Extender: Nicholas [name] in Treatment: 12 Wound Status Wound Number: 17 Primary Lymphedema Etiology: Wound Location: Right, Lateral Lower Leg Wound Open Wounding Event: Gradually Appeared Status: Date Acquired:  04/03/2020 Comorbid Lymphedema, Sleep Apnea, Congestive Heart Failure, Weeks Of Treatment: 0 History: Hypertension, Peripheral Venous Disease, Received Chemotherapy Clustered Wound: No Photos Photo Uploaded By: Mikeal Hawthorne on 04/08/2020 11:46:26 Wound Measurements Length: (cm) 5.5 Width: (cm) 5.5 Depth: (cm) 0.1 Area: (cm) 23.758 Volume: (cm) 2.376 % Reduction in Area: 0% % Reduction in Volume: 0% Epithelialization: Small (1-33%) Tunneling: No Undermining: No Wound Description Classification: Full Thickness Without Exposed Support Structures Wound Margin: Flat and Intact Exudate Amount: Large Exudate Type: Serous Exudate Color: amber Foul Odor After Cleansing: No Slough/Fibrino Yes Wound Bed Granulation Amount:  Medium (34-66%) Exposed Structure Granulation Quality: Red Fascia Exposed: No Necrotic Amount: Medium (34-66%) Fat Layer (Subcutaneous Tissue) Exposed: Yes Necrotic Quality: Adherent Slough Tendon Exposed: No Muscle Exposed: No Joint Exposed: No Bone Exposed: No Electronic Signature(s) Signed: 04/06/2020 5:02:37 PM By: Baruch Gouty RN, BSN Entered By: Baruch Gouty on 04/06/2020 14:43:38 -------------------------------------------------------------------------------- Wound Assessment Details Patient Name: Date of Service: Nicholas [name] DFO RD K. 04/06/2020 2:15 PM Medical Record Number: [id number] Patient Account Number: [id number] Date of Birth/Sex: Treating RN: Jan 26, 1946 (74 y.o. Nicholas [name] Primary Care Koryn Charlot: Nicholas [name] Other Clinician: Referring Teryl Mcconaghy: Treating Xylan Sheils/Extender: Nicholas [name] in Treatment: 12 Wound Status Wound Number: 18 Primary Lymphedema Etiology: Wound Location: Right, Lateral Ankle Wound Open Wounding Event: Gradually Appeared Status: Date Acquired: 04/03/2020 Comorbid Lymphedema, Sleep Apnea, Congestive Heart Failure, Weeks Of Treatment: 0 History: Hypertension, Peripheral  Venous Disease, Received Chemotherapy Clustered Wound: No Photos Photo Uploaded By: Mikeal Hawthorne on 04/08/2020 11:54:35 Wound Measurements Length: (cm) 4 Width: (cm) 3.5 Depth: (cm) 0.1 Area: (cm) 10.996 Volume: (cm) 1.1 % Reduction in Area: % Reduction in Volume: Epithelialization: Small (1-33%) Tunneling: No Undermining: No Wound Description Classification: Partial Thickness Wound Margin: Indistinct, nonvisible Exudate Amount: Large Exudate Type: Serous Exudate Color: amber Foul Odor After Cleansing: No Slough/Fibrino No Wound Bed Granulation Amount: Small (1-33%) Exposed Structure Granulation Quality: Pink Fascia Exposed: No Necrotic Amount: None Present (0%) Fat Layer (Subcutaneous Tissue) Exposed: No Tendon Exposed: No Muscle Exposed: No Joint Exposed: No Bone Exposed: No Limited to Skin Breakdown Electronic Signature(s) Signed: 04/06/2020 5:02:37 PM By: Baruch Gouty RN, BSN Entered By: Baruch Gouty on 04/06/2020 14:44:53 -------------------------------------------------------------------------------- Wound Assessment Details Patient Name: Date of Service: Nicholas [name] DFO RD K. 04/06/2020 2:15 PM Medical Record Number: [id number] Patient Account Number: [id number] Date of Birth/Sex: Treating RN: May 22, 1946 (74 y.o. Nicholas [name] Primary Care Mennie Spiller: Nicholas [name] Other Clinician: Referring Westlee Devita: Treating Octavis Sheeler/Extender: Nicholas [name] in Treatment: 12 Wound Status Wound Number: 19 Primary Lymphedema Etiology: Wound Location: Right, Medial, Dorsal Foot Wound Open Wounding Event: Blister Status: Date Acquired: 04/03/2020 Comorbid Lymphedema, Sleep Apnea, Congestive Heart Failure, Weeks Of Treatment: 0 History: Hypertension, Peripheral Venous Disease, Received Chemotherapy Clustered Wound: No Photos Photo Uploaded By: Mikeal Hawthorne on 04/08/2020 11:54:36 Wound Measurements Length: (cm) 0.5 Width: (cm)  2 Depth: (cm) 0.1 Area: (cm) 0.785 Volume: (cm) 0.079 % Reduction in Area: % Reduction in Volume: Epithelialization: Small (1-33%) Tunneling: No Undermining: No Wound Description Classification: Partial Thickness Wound Margin: Flat and Intact Exudate Amount: Large Exudate Type: Serous Exudate Color: amber Foul Odor After Cleansing: No Slough/Fibrino No Wound Bed Granulation Amount: Small (1-33%) Exposed Structure Granulation Quality: Pink Fascia Exposed: No Necrotic Amount: None Present (0%) Fat Layer (Subcutaneous Tissue) Exposed: No Tendon Exposed: No Muscle Exposed: No Joint Exposed: No Bone Exposed: No Limited to Skin Breakdown Electronic Signature(s) Signed: 04/06/2020 5:02:37 PM By: Baruch Gouty RN, BSN Entered By: Baruch Gouty on 04/06/2020 14:46:28 -------------------------------------------------------------------------------- Vitals Details Patient Name: Date of Service: Nicholas [name] DFO RD K. 04/06/2020 2:15 PM Medical Record Number: [id number] Patient Account Number: [id number] Date of Birth/Sex: Treating RN: 1946/05/16 (74 y.o. Nicholas [name] Primary Care Audwin Semper: Nicholas [name] Other Clinician: Referring Dondi Aime: Treating Keyondra Lagrand/Extender: Nicholas [name] in Treatment: 12 Vital Signs Time Taken: 14:35 Temperature (F): 97.8 Height (in): 67 Pulse (bpm): 74 Source: Stated Respiratory Rate (breaths/min): 18 Weight (lbs): 250 Blood Pressure (mmHg): 138/67 Source: Stated Reference Range: 80 - 120 mg /  dl Body Mass Index (BMI): 39.2 Electronic Signature(s) Signed: 04/06/2020 5:02:37 PM By: Baruch Gouty RN, BSN Entered By: Baruch Gouty on 04/06/2020 14:35:42

## 2020-04-12 NOTE — ED Provider Notes (Signed)
[facility] Emergency Department Provider Note MRN:  [id number]  Arrival date & time: 04/12/20     Chief Complaint   Leg Pain, Leg Swelling, and Cellulitis   History of Present Illness   Nicholas [name] is a 74 y.o. year-old male with a history of hypertension, lymphedema presenting to the ED with chief complaint of cellulitis.  Patient sent here by wound care doctor for admission and IV antibiotics.  Has been having worsening right leg redness and swelling for the past 3 weeks, trialed p.o. antibiotics 2 or 3 weeks ago but this did not help.  Much worse over the past 5 days, spreading redness.  Denies fever, no other complaints.  Review of Systems  A complete 10 system review of systems was obtained and all systems are negative except as noted in the HPI and PMH.   Patient's Health History    Past Medical History:  Diagnosis Date  . 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 Hyperlipidemia   . Hypertension   . Lymphedema     Past Surgical History:  Procedure Laterality Date  . 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 removal of melanoma of left foot/amputation of toes 1&25 Jul 2006  . WRIST FRACTURE SURGERY     74 years old-set    Family History  Problem Relation Age of Onset  . Hypertension Father   . CVA Father        age 52  . Hyperlipidemia Father   . Other Mother        Deceased  . Healthy Sister   . Healthy Brother     Social History   Socioeconomic History  . Marital status: Married    Spouse name: Not on file  . Number of children: Not on file  . Years of education: Not on file  . Highest education level: Not on file  Occupational History  . Occupation: retired    Comment: Engineer, maintenance (IT)  Tobacco Use  . Smoking status: Former Smoker    Packs/day: 1.00    Years: 16.00    Pack years: 16.00    Types: Cigarettes    Quit date: 12/22/1993    Years since quitting: 26.3  . Smokeless tobacco: Never Used    Vaping Use  . Vaping Use: Never used  Substance and Sexual Activity  . Alcohol use: No    Alcohol/week: 21.0 standard drinks    Types: 21 Standard drinks or equivalent per week  . Drug use: No  . Sexual activity: Not on file  Other Topics Concern  . Not on file  Social History Narrative   Married 1981. Wife has kids-1 with 1 adopted grandchild.       Retired Tax adviser      Highest level of education:  B.S.      Hobbies: antiques, former Air cabin crew      Exercise: none currently.    Social Determinants of Health   Financial Resource Strain:   . Difficulty of Paying Living Expenses: Not on file  Food Insecurity:   . Worried About Charity fundraiser in the Last Year: Not on file  . Ran Out of Food in the Last Year: Not on file  Transportation Needs:   . Lack of Transportation (Medical): Not on file  . Lack of Transportation (Non-Medical): Not on file  Physical Activity:   . Days of Exercise per Week: Not  on file  . Minutes of Exercise per Session: Not on file  Stress:   . Feeling of Stress : Not on file  Social Connections:   . Frequency of Communication with Friends and Family: Not on file  . Frequency of Social Gatherings with Friends and Family: Not on file  . Attends Religious Services: Not on file  . Active Member of Clubs or Organizations: Not on file  . Attends Archivist Meetings: Not on file  . Marital Status: Not on file  Intimate Partner Violence:   . Fear of Current or Ex-Partner: Not on file  . Emotionally Abused: Not on file  . Physically Abused: Not on file  . Sexually Abused: Not on file     Physical Exam   Vitals:   04/12/20 1402 04/12/20 1415  BP: (!) 178/96 (!) 184/86  Pulse:  94  Resp:  15  Temp:    SpO2:  98%    CONSTITUTIONAL: Well-appearing, NAD NEURO:  Alert and oriented x 3, no focal deficits EYES:  eyes equal and reactive ENT/NECK:  no LAD, no JVD CARDIO: Regular rate, well-perfused, normal S1 and S2 PULM:  CTAB no  wheezing or rhonchi GI/GU:  normal bowel sounds, non-distended, non-tender MSK/SPINE:  No gross deformities SKIN: Profound lymphedema to bilateral lower extremities with bright erythematous right lower extremity PSYCH:  Appropriate speech and behavior  *Additional and/or pertinent findings included in MDM below  Diagnostic and Interventional Summary    EKG Interpretation  Date/Time:    Ventricular Rate:    PR Interval:    QRS Duration:   QT Interval:    QTC Calculation:   R Axis:     Text Interpretation:        Labs Reviewed  COMPREHENSIVE METABOLIC PANEL - Abnormal; Notable for the following components:      Result Value   BUN 26 (*)    AST 13 (*)    All other components within normal limits  CBC WITH DIFFERENTIAL/PLATELET - Abnormal; Notable for the following components:   WBC 10.6 (*)    All other components within normal limits  SARS CORONAVIRUS 2 BY RT PCR (HOSPITAL ORDER, [facility])  LACTIC ACID, PLASMA  LACTIC ACID, PLASMA  URINALYSIS, ROUTINE W REFLEX MICROSCOPIC    DG Chest 2 View  Final Result      Medications  vancomycin (VANCOREADY) IVPB 2000 mg/400 mL (2,000 mg Intravenous New Bag/Given 04/12/20 1449)  acetaminophen (TYLENOL) tablet 650 mg (650 mg Oral Given 04/12/20 1413)     Procedures  /  Critical Care Procedures  ED Course and Medical Decision Making  I have reviewed the triage vital signs, the nursing notes, and pertinent available records from the EMR.  Listed above are laboratory and imaging tests that I personally ordered, reviewed, and interpreted and then considered in my medical decision making (see below for details).  Lymphedema with concern for cellulitis not responding to p.o. antibiotics, wound care doctor wanting admission and IV antibiotics.  No systemic signs of illness.  Will start antibiotics and admit.       Barth Kirks. Sedonia Small, [place] [email or web address]  Final Clinical Impressions(s) / ED Diagnoses     ICD-10-CM   1. Cellulitis of right lower extremity  L03.115     ED Discharge Orders    None       Discharge Instructions Discussed with and Provided to Patient:   Discharge Instructions  None       Maudie Flakes, MD 04/12/20 [phone]

## 2020-04-12 NOTE — Progress Notes (Signed)
A consult was received from an ED physician for vancomycin per pharmacy dosing.  The patient's profile has been reviewed for ht/wt/allergies/indication/available labs.   A one time order has been placed for vancomycin 2 gm.  Further antibiotics/pharmacy consults should be ordered by admitting physician if indicated.                       Thank you, Eudelia Bunch, Pharm.D 04/12/2020 2:05 PM

## 2020-04-12 NOTE — Progress Notes (Signed)
[name] ([id number]) , Visit Report for 04/06/2020 HPI Details Patient Name: Date of Service: Nicholas [name]. 04/06/2020 2:15 PM Medical Record Number: [id number] Patient Account Number: [id number] Date of Birth/Sex: Treating RN: 1945/07/28 (74 y.o. [name]) Carlene Coria Primary Care Provider: [name] Other Clinician: Referring Provider: Treating Provider/Extender: [name] in Treatment: 12 History of Present Illness HPI Description: ADMISSION 07/04/2018 This is a 74 year old man who has bilateral lymphedema felt to be secondary to previous treatment for malignant melanoma on his left toes. 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 Malignant melanoma was treated with immunotherapy. He is not had radiation. Sometime after this he developed edema in the left leg that spread into the right leg. He has bilateral compression pumps at home and he uses them twice a day and claims to be quite compliant. He also has a juxta lite type stocking at home which is apparently 74-year-old. He states that he fell in early October dislocating and fracturing his left shoulder. He slept in a recliner with his legs dependent. He was seen and followed at the rehab clinic at [facility] and was having lymphedema wraps done by 1 of the PT who noted his open wounds last week and he has been referred here for management. The patient has 2 open wounds on the right s distal lower leg and one on the left anterior lower leg The patient is not a diabetic. 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 For one reason or another we could not get him set up with home health and he kept the wraps on all week which he generally seem to tolerate. He did not use his compression pumps over the top of the wraps which I told him he could do this week. He comes in with a new rash on both feet which looks like tinea 07/22/2018; much better edema control bilaterally. The wounds on the right lateral calf and left anterior lower leg both look a lot better. There is epithelialization over both surfaces but I think this is fragile and I am going to put him in our compression wraps for another week. He says he is been using his compression pumps at home. He has a constellation of lower extremity compression garments that he is going to bring in next week. In discussion with him I cannot really get a sense of everything he has. I think he was at one point prescribed 30-40 below-knee stockings but he points out he could not get them on. 07/29/2018; the patient arrives in his legs are healed. He has a constellation of stockings none of which I think are going to be that helpful except for the fact that he has external wraparound stockings of some description. He has above-knee stockings but I cannot imagine it would be possible to get these on READMISSION 01/08/2020 Patient is now a 74 year old man. We had him for a short period from December 2019 through January 2020. He has bilateral lower extremity lymphedema which he initially traces to treatment for malignant melanoma 7 years ago. He has been followed at the lymphedema clinic on  [address] and recently has compression pumps that go up to his mid abdomen. He recently developed superficial wounds bilaterally. They will not manage these in the lymphedema clinic where the services are run by therapy. He has not been using his compression pumps. His significant other cannot get the stockings on his legs and the patient is only limited ability to help because  of Parkinson's disease Past medical history is essentially unchanged. He has sleep apnea, Parkinson's disease at some point labeled his Parkinson's plus, bilateral lymphedema, history of malignant melanoma. He does not have an arterial issue that I am aware of although his previous arterial studies have been noncompressible 7/1; patient we admitted the clinic 2 weeks ago. He has severe bilateral lymphedema. He had wounds predominantly on the left leg but also superficially on the right. He has compression pumps which he says he is using once a day. 7/15; 2-week follow-up. He comes in for nurse visit. He has severe bilateral lymphedema. He has upper abdominal level compression pumps which she is using twice a day. We have been wrapping both his lower legs the areas on the right leg are healed. He still has an area of left upper lateral and right anterior medial. Anterior medial wound is small. He is tolerating this well. 7/22; still significant lymphedema even with 4-layer compression and external compression pump usage. He said he had some itching on the right anterior leg he comes in with a large superficial area of skin breakdown anteriorly and a smaller area laterally on the right leg. He also has a rash on the left dorsal ankle crease. Some of these look pustular. I wonder if this is an occlusion issue from foam we put down here. He also states the only thing different this week is the did a 5-hour car ride with his legs dependent. There was nothing open on the right leg last week but we wrapped it in any case 7/27; culture of the blisters on his left anterior leg grew staph aureus. I gave him doxycycline which he started yesterday. He is already appear cleaner and dry. His major wound is on the right anterior lower leg and the right lateral lower leg. There is nothing else open on the left leg. He has better edema control today. 03/04/20-Patient comes in with new areas after he had a fall, on his  left dorsal foot and right dorsal foot and left medial proximal leg, there is significant amount of drainage into his dressing that was removed today which is actually greenish in color. It looks like he is completed a course of doxycycline for staph that grew from his blisters however he might need antipseudomonal coverage at this point 8/16; apparently the wounds originally were all healed until his last visit when after a fall he had areas on the left dorsal foot, right dorsal foot and left medial proximal leg. T oday all of these have healed and he has a small area on the left medial ankle. The patient has severe bilateral lymphedema which is managed usually with compression stockings/external compression stockings as well his mid abdomen compression pumps. He is still using the latter twice a day. 8/23; the wounds on his dorsal feet and medial ankle that he had from a fall last time of closed however he has a new wrap injury on the left medial upper leg and a smaller 1 anteriorly. He is using his compression pumps twice a day 8/30; nothing open on the left leg. He is  in his stocking. Using his compression pumps twice a day. From last week still a small opening in the medial upper leg on the right he has a new area in the upper mid tibia. He thinks this might have been a wrap injury looks more like the remanent of the blister 9/7; the areas on the right anterior and right medial lower leg have both closed over. He came in for a nurse visit today but I looked at the right leg and indeed it is healed. His edema control is moderate but certainly a lot better than when I first saw this. He is using his compression pumps twice a day over his juxta lite stockings 9/14; the area on the right anterior and right medial lower legs closed over last week and we discharged him into his own external compression stocking. This is in accompaniment of abdominal level external compression pumps that he uses for 45  minutes twice a day. He states that the control lasted till about Friday he started to have weeping edema and then by Saturday things look like they were starting to breakdown. He arrives in clinic today with a large erythematous area on the right lateral calf with weeping edema fluid also on the medial and anterior ankle. Electronic Signature(s) Signed: 04/06/2020 5:12:45 PM By: Linton Ham MD Entered By: Linton Ham on 04/06/2020 17:06:29 -------------------------------------------------------------------------------- Physical Exam Details Patient Name: Date of Service: Nicholas [name]. 04/06/2020 2:15 PM Medical Record Number: [id number] Patient Account Number: [id number] Date of Birth/Sex: Treating RN: 05/25/1946 (74 y.o. [name] Primary Care Provider: [name] Other Clinician: Referring Provider: Treating Provider/Extender: [name] in Treatment: 12 Constitutional Sitting or standing Blood Pressure is within target range for patient.. Pulse regular and within target range for patient.Marland Kitchen Respirations regular, non-labored and within target range.. Temperature is normal and within the target range for the patient.Marland Kitchen Appears in no distress. Respiratory work of breathing is normal. Bilateral breath sounds are clear and equal in all lobes with no wheezes, rales or rhonchi.. Cardiovascular Heart rhythm and rate regular, without murmur or gallop. No signs of CHF. Notes Wound exam; marked nonpitting edema in the right lower leg. This is at 52 cm which is 10 or 11 cm larger than the last time we measured this there is no evidence of acute DVT or cellulitis but extensive edema spreading into the forefoot. He has areas of weeping erythema on the right lateral calf right anterior ankle. He is an imminent risk of a severe breakdown in this area. Electronic Signature(s) Signed: 04/06/2020 5:12:45 PM By: Linton Ham MD Entered By: Linton Ham on 04/06/2020 17:07:37 -------------------------------------------------------------------------------- Physician Orders Details Patient Name: Date of Service: Nicholas [name]. 04/06/2020 2:15 PM Medical Record Number: [id number] Patient Account Number: [id number] Date of Birth/Sex: Treating RN: 1945/10/20 (74 y.o. [name] Primary Care Provider: [name] Other Clinician: Referring Provider: Treating Provider/Extender: [name] in Treatment: 12 Verbal / Phone Orders: No Diagnosis Coding ICD-10 Coding Code Description I89.0 Lymphedema, not elsewhere classified L97.811 Non-pressure chronic ulcer of other part of right lower leg limited to breakdown of skin L97.821 Non-pressure chronic ulcer of other part of left lower leg limited to breakdown of skin Follow-up Appointments Return Appointment in 2 weeks. Nurse Visit: - 1 week for rewrap Dressing Change Frequency Do not change entire dressing for one week. Skin Barriers/Peri-Wound Care TCA Cream or Ointment Wound Cleansing May shower  with protection. - with cast protectors. Primary Wound Dressing Wound #17 Right,Lateral Lower Leg Calcium Alginate with Silver Wound #18 Right,Lateral Ankle Calcium Alginate with Silver Wound #19 Right,Medial,Dorsal Foot Calcium Alginate with Silver Edema Control 4 layer compression - Right Lower Extremity - unna boot at the foot and top of leg to anchor Avoid standing for long periods of time Elevate legs to the level of the heart or above for 30 minutes daily and/or when sitting, a frequency of: - throughout the day. Exercise regularly Support Garment 30-40 mm/Hg pressure to: - Farrow wrap to left leg daily Segmental Compressive Device. - use lymphedema pumps twice a day an hour each time. Once in the morning and once in the evening. Electronic Signature(s) Signed: 04/06/2020 5:12:45 PM By: Linton Ham MD Signed: 04/12/2020 1:15:48 PM By:  Carlene Coria RN Entered By: Carlene Coria on 04/06/2020 15:22:41 -------------------------------------------------------------------------------- Problem List Details Patient Name: Date of Service: Nicholas [name]. 04/06/2020 2:15 PM Medical Record Number: [id number] Patient Account Number: [id number] Date of Birth/Sex: Treating RN: 1945-10-05 (74 y.o. 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 04/06/2020 2:15 PM Medical Record Number: [id number] Patient Account Number: [id number] Date of Birth/Sex: Treating RN: Sep 14, 1945 (74 y.o. [name] Primary Care Provider: [name] Other Clinician: Referring Provider: Treating Provider/Extender: [name] in Treatment: 12 Subjective History of Present Illness (HPI) ADMISSION 07/04/2018 This is a 74 year old man who has bilateral lymphedema felt to be secondary to previous treatment for malignant melanoma on his left toes. 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 Malignant melanoma was treated with  immunotherapy. He is not had radiation. Sometime after this he developed edema in the left leg that spread into the right leg. He has bilateral compression pumps at home and he uses them twice a day and claims to be quite compliant. He also has a juxta lite type stocking at home which is apparently 25-year-old. He states that he fell in early October dislocating and fracturing his left shoulder. He slept in a recliner with his legs dependent. He was seen and followed at the rehab clinic at [facility] and was having lymphedema wraps done by 1 of the PT who noted his open wounds last week and he has been referred here for management. The patient has 2 open wounds on the right s distal lower leg and one on the left anterior lower leg The patient is not a diabetic. 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 For one reason or another we could not get him set up with home health and he kept the wraps on all week which he generally seem to tolerate. He did not use his compression pumps over the top of the wraps which I told him he could do this week. He comes in with a new rash on both feet which looks like tinea 07/22/2018; much better edema control bilaterally. The wounds on the right lateral calf and left anterior lower leg both look a lot better. There is epithelialization over both surfaces but I think this is fragile and I am going to put him in our compression wraps for another week. He says he is been using his compression pumps at home. ooHe has a constellation of lower extremity compression garments that he is going to bring in next week. In  discussion with him I cannot really get a sense of everything he has. I think he was at one point prescribed 30-40 below-knee stockings but he points out he could not get them on. 07/29/2018; the patient arrives in his legs are healed. He has a constellation of stockings none of which I think are going to be that helpful except for the fact that he has external wraparound stockings of some description. He has above-knee stockings but I cannot imagine it would be possible to get these on READMISSION 01/08/2020 Patient is now a 74 year old man. We had him for a short period from December 2019 through January 2020. He has bilateral lower extremity lymphedema which he initially traces to treatment for malignant melanoma 7 years ago. He has been followed at the lymphedema clinic on [facility] and recently has compression pumps that go up to his mid abdomen. He recently developed superficial wounds bilaterally. They will not manage these in the lymphedema clinic where the services are run by therapy. He has not been using his compression pumps. His significant other cannot get the stockings on his legs and the patient is only limited ability to help because of Parkinson's disease Past medical history is essentially unchanged. He has sleep apnea, Parkinson's disease at some point labeled his Parkinson's plus, bilateral lymphedema, history of malignant melanoma. He does not have an arterial issue that I am aware of although his previous arterial studies have been noncompressible 7/1; patient we admitted the clinic 2 weeks ago. He has severe bilateral lymphedema. He had wounds predominantly on the left leg but also superficially on the right. He has compression pumps which he says he is using once a day. 7/15; 2-week follow-up. He comes in for nurse visit. He has severe bilateral lymphedema. He has upper abdominal level compression pumps which she is using twice a day. We have been wrapping both his lower  legs the areas on the right leg are healed. He still has an area of left upper lateral and right anterior medial. Anterior medial wound is small. He is tolerating this well. 7/22; still significant lymphedema even with 4-layer compression and external compression pump usage. He said he had some itching on the right anterior leg he comes in with a large superficial area of skin breakdown anteriorly and a smaller area laterally on the right leg. He also has a rash on the left dorsal ankle crease. Some of these look pustular. I wonder if this is an occlusion issue from foam we put down here. He also states the only thing different this week is the did a 5-hour car ride with his legs dependent.  There was nothing open on the right leg last week but we wrapped it in any case 7/27; culture of the blisters on his left anterior leg grew staph aureus. I gave him doxycycline which he started yesterday. He is already appear cleaner and dry. His major wound is on the right anterior lower leg and the right lateral lower leg. There is nothing else open on the left leg. He has better edema control today. 03/04/20-Patient comes in with new areas after he had a fall, on his left dorsal foot and right dorsal foot and left medial proximal leg, there is significant amount of drainage into his dressing that was removed today which is actually greenish in color. It looks like he is completed a course of doxycycline for staph that grew from his blisters however he might need antipseudomonal coverage at this point 8/16; apparently the wounds originally were all healed until his last visit when after a fall he had areas on the left dorsal foot, right dorsal foot and left medial proximal leg. T oday all of these have healed and he has a small area on the left medial ankle. The patient has severe bilateral lymphedema which is managed usually with compression stockings/external compression stockings as well his mid abdomen  compression pumps. He is still using the latter twice a day. 8/23; the wounds on his dorsal feet and medial ankle that he had from a fall last time of closed however he has a new wrap injury on the left medial upper leg and a smaller 1 anteriorly. He is using his compression pumps twice a day 8/30; nothing open on the left leg. He is in his stocking. Using his compression pumps twice a day. From last week still a small opening in the medial upper leg on the right he has a new area in the upper mid tibia. He thinks this might have been a wrap injury looks more like the remanent of the blister 9/7; the areas on the right anterior and right medial lower leg have both closed over. He came in for a nurse visit today but I looked at the right leg and indeed it is healed. His edema control is moderate but certainly a lot better than when I first saw this. He is using his compression pumps twice a day over his juxta lite stockings 9/14; the area on the right anterior and right medial lower legs closed over last week and we discharged him into his own external compression stocking. This is in accompaniment of abdominal level external compression pumps that he uses for 45 minutes twice a day. He states that the control lasted till about Friday he started to have weeping edema and then by Saturday things look like they were starting to breakdown. He arrives in clinic today with a large erythematous area on the right lateral calf with weeping edema fluid also on the medial and anterior ankle. Objective Constitutional Sitting or standing Blood Pressure is within target range for patient.. Pulse regular and within target range for patient.Marland Kitchen Respirations regular, non-labored and within target range.. Temperature is normal and within the target range for the patient.Marland Kitchen Appears in no distress. Vitals Time Taken: 2:35 PM, Height: 67 in, Source: Stated, Weight: 250 lbs, Source: Stated, BMI: 39.2, Temperature: 97.8 F,  Pulse: 74 bpm, Respiratory Rate: 18 breaths/min, Blood Pressure: 138/67 mmHg. Respiratory work of breathing is normal. Bilateral breath sounds are clear and equal in all lobes with no wheezes, rales or rhonchi.. Cardiovascular Heart rhythm  and rate regular, without murmur or gallop. No signs of CHF. General Notes: Wound exam; marked nonpitting edema in the right lower leg. This is at 52 cm which is 10 or 11 cm larger than the last time we measured this there is no evidence of acute DVT or cellulitis but extensive edema spreading into the forefoot. He has areas of weeping erythema on the right lateral calf right anterior ankle. He is an imminent risk of a severe breakdown in this area. Integumentary (Hair, Skin) Wound #17 status is Open. Original cause of wound was Gradually Appeared. The wound is located on the Right,Lateral Lower Leg. The wound measures 5.5cm length x 5.5cm width x 0.1cm depth; 23.758cm^2 area and 2.376cm^3 volume. There is Fat Layer (Subcutaneous Tissue) exposed. There is no tunneling or undermining noted. There is a large amount of serous drainage noted. The wound margin is flat and intact. There is medium (34-66%) red granulation within the wound bed. There is a medium (34-66%) amount of necrotic tissue within the wound bed including Adherent Slough. Wound #18 status is Open. Original cause of wound was Gradually Appeared. The wound is located on the Right,Lateral Ankle. The wound measures 4cm length x 3.5cm width x 0.1cm depth; 10.996cm^2 area and 1.1cm^3 volume. The wound is limited to skin breakdown. There is no tunneling or undermining noted. There is a large amount of serous drainage noted. The wound margin is indistinct and nonvisible. There is small (1-33%) pink granulation within the wound bed. There is no necrotic tissue within the wound bed. Wound #19 status is Open. Original cause of wound was Blister. The wound is located on the Right,Medial,Dorsal Foot. The wound  measures 0.5cm length x 2cm width x 0.1cm depth; 0.785cm^2 area and 0.079cm^3 volume. The wound is limited to skin breakdown. There is no tunneling or undermining noted. There is a large amount of serous drainage noted. The wound margin is flat and intact. There is small (1-33%) pink granulation within the wound bed. There is no necrotic tissue within the wound bed. Assessment Active Problems ICD-10 Lymphedema, not elsewhere classified Non-pressure chronic ulcer of other part of right lower leg limited to breakdown of skin Procedures Wound #17 Pre-procedure diagnosis of Wound #17 is a Lymphedema located on the Right,Lateral Lower Leg . There was a Four Layer Compression Therapy Procedure by Carlene Coria, RN. Post procedure Diagnosis Wound #17: Same as Pre-Procedure Wound #18 Pre-procedure diagnosis of Wound #18 is a Lymphedema located on the Right,Lateral Ankle . There was a Four Layer Compression Therapy Procedure by Carlene Coria, RN. Post procedure Diagnosis Wound #18: Same as Pre-Procedure Wound #19 Pre-procedure diagnosis of Wound #19 is a Lymphedema located on the Right,Medial,Dorsal Foot . There was a Four Layer Compression Therapy Procedure by Carlene Coria, RN. Post procedure Diagnosis Wound #19: Same as Pre-Procedure Plan Follow-up Appointments: Return Appointment in 2 weeks. Nurse Visit: - 1 week for rewrap Dressing Change Frequency: Do not change entire dressing for one week. Skin Barriers/Peri-Wound Care: TCA Cream or Ointment Wound Cleansing: May shower with protection. - with cast protectors. Primary Wound Dressing: Wound #17 Right,Lateral Lower Leg: Calcium Alginate with Silver Wound #18 Right,Lateral Ankle: Calcium Alginate with Silver Wound #19 Right,Medial,Dorsal Foot: Calcium Alginate with Silver Edema Control: 4 layer compression - Right Lower Extremity - unna boot at the foot and top of leg to anchor Avoid standing for long periods of time Elevate legs to  the level of the heart or above for 30 minutes daily and/or when sitting,  a frequency of: - throughout the day. Exercise regularly Support Garment 30-40 mm/Hg pressure to: - Farrow wrap to left leg daily Segmental Compressive Device. - use lymphedema pumps twice a day an hour each time. Once in the morning and once in the evening. 1. TCA 2. Silver alginate ABDs to any weeping areas 3. Back in 4 layer compression with Unna boot at the top of his leg to anchor 4. I am not sure how really to prevent this from happening. Clearly the stocking we put on him did not hold this leg although he has one on the other side apparently without any open wounds 5. He has the abdominal level compression pumps that he says are new he just got these in July but he seems to think that these cause more weeping edema. I wonder if were going to have to have these looked at as part of a last ditch attempt to try and keep this area from breaking down recurrently. I am a 1 unaware whether we have a lymphedema specialist anywhere in [place] at the moment I will have to look into this 6. No evidence of any additional confounding variables that I can see. He does not have edema in his thigh there is no evidence of CHF Electronic Signature(s) Signed: 04/06/2020 5:12:45 PM By: Linton Ham MD Entered By: Linton Ham on 04/06/2020 17:09:20 -------------------------------------------------------------------------------- SuperBill Details Patient Name: Date of Service: Nicholas [name]. 04/06/2020 Medical Record Number: [id number] Patient Account Number: [id number] Date of Birth/Sex: Treating RN: Jan 22, 1946 (74 y.o. [name] Primary Care Provider: [name] Other Clinician: Referring Provider: Treating Provider/Extender: [name] in Treatment: 12 Diagnosis Coding ICD-10 Codes Code Description I89.0 Lymphedema, not elsewhere classified L97.811 Non-pressure chronic  ulcer of other part of right lower leg limited to breakdown of skin L97.821 Non-pressure chronic ulcer of other part of left lower leg limited to breakdown of skin Facility Procedures Physician Procedures : CPT4 Code Description Modifier [id number] - WC PHYS LEVEL 4 - EST PT ICD-10 Diagnosis Description I89.0 Lymphedema, not elsewhere classified L97.811 Non-pressure chronic ulcer of other part of right lower leg limited to breakdown of skin Quantity: 1 Electronic Signature(s) Signed: 04/06/2020 5:12:45 PM By: Linton Ham MD Entered By: Linton Ham on 04/06/2020 17:09:38

## 2020-04-12 NOTE — ED Notes (Signed)
Patient transported to X-ray 

## 2020-04-12 NOTE — ED Notes (Signed)
Pt placed on male purewick at 50 mmHg.

## 2020-04-12 NOTE — ED Notes (Signed)
ED Provider at bedside. 

## 2020-04-12 NOTE — Progress Notes (Signed)
Pharmacy Antibiotic Note  Nicholas [name] is a 74 y.o. male sent from wound clinic admitted on 04/12/2020 with cellulitis of RLE.  Pharmacy has been consulted for cefepime dosing.  Plan: Cefepime 2g IV q8h No dose adjustments needed, Pharmacy will sign off  Height: 5\' 7"  (170.2 cm) Weight: 113.4 kg (250 lb) IBW/kg (Calculated) : 66.1  Temp (24hrs), Avg:97.7 F (36.5 C), Min:97.7 F (36.5 C), Max:97.7 F (36.5 C)  Recent Labs  Lab 04/12/20 1331  WBC 10.6*  CREATININE 0.83  LATICACIDVEN 1.3    Estimated Creatinine Clearance: 93.9 mL/min (by C-G formula based on SCr of 0.83 mg/dL).    Allergies  Allergen Reactions   Amoxicillin Swelling    Presumed angioedema of lips due to prednisone    Antimicrobials this admission:  9/20 Vanc 2g x 1 9/20 Cefepime >>  Dose adjustments this admission:  none  Microbiology results:  9/20 COVID:  Thank you for allowing pharmacy to be a part of this patients care.  Peggyann Juba, PharmD, BCPS Pharmacy: [phone] 04/12/2020 4:16 PM

## 2020-04-12 NOTE — ED Triage Notes (Signed)
Patient c/o bilateral leg swelling, redness, and pain x 10 weeks. Patient has been going to the wound care center for wounds on BLE. Patient sent to the ED for IV antibiotics.

## 2020-04-12 NOTE — H&P (Signed)
History and Physical        Hospital Admission Note Date: 04/12/2020  Patient name: Nicholas [name] Medical record number: [id number] Date of birth: 17-Sep-1945 Age: 74 y.o. Gender: male  PCP: Nicholas Olp, MD  Patient coming from: home Lives with: wife   Chief Complaint    Chief Complaint  Patient presents with  . Leg Pain  . Leg Swelling  . Cellulitis      HPI:   This is a 74 year old male with past medical history of hypertension, Parkinson's, melanoma s/p inguinal lymphadenectomy with subsequent chronic lymphedema who follows with the wound care clinic, hypertension who presented to the ED with complaint of worsening right leg redness and swelling for the past 3 weeks.  He was trialed on p.o. antibiotics 2 or 3 weeks ago without much improvement.  His symptoms have worsened over the past 5 days with increased redness.  He was seen by his wound care doctor today advised the patient to come to the ED for admission and IV antibiotics.  Denies any fever or any other complaints.  ED Course: Afebrile, hypertensive and otherwise stable on room air..  Notable labs: WBC 10.6.  CXR unremarkable.  He was given Tylenol and vancomycin in the ED.  Vitals:   04/12/20 1545 04/12/20 1600  BP: 107/75 (!) 141/86  Pulse: 93 96  Resp: 20 (!) 22  Temp:    SpO2: 99% 100%     Review of Systems:  Review of Systems  Constitutional: Negative for chills and fever.  Respiratory: Negative for shortness of breath.   Cardiovascular: Negative for chest pain and palpitations.  Gastrointestinal: Negative for nausea and vomiting.  Genitourinary: Negative.   Musculoskeletal:       Right hip pain  All other systems reviewed and are negative.   Medical/Social/Family History   Past Medical History: Past Medical History:  Diagnosis Date  . Cancer [facility]) Jan 2008   skin; hx of melanoma left  foot/ amputation of toes 1&2   . Hyperlipidemia   . Hypertension   . Lymphedema     Past Surgical History:  Procedure Laterality Date  . 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 removal of melanoma of left foot/amputation of toes 1&25 Jul 2006  . WRIST FRACTURE SURGERY     74 years old-set    Medications: Prior to Admission medications   Medication Sig Start Date End Date Taking? Authorizing Provider  carbidopa-levodopa (SINEMET IR) 25-100 MG tablet TAKE 2 TABLETS AT 7AM,11AM, 3PM, AND AT 7PM. Patient taking differently: Take 2 tablets by mouth 4 (four) times daily. TAKE 2 TABLETS AT 7AM,11AM, 3PM, AND AT 7PM. 03/01/20  Yes [name], [name], DO  Multiple Vitamins-Minerals (MULTIVITAMIN ADULTS 50+) TABS Take 1 tablet by mouth in the morning and at bedtime.   Yes [provider]  rosuvastatin (CRESTOR) 10 MG tablet TAKE 1 TABLET ONCE DAILY. 04/05/20  Yes Nicholas Olp, MD    Allergies:   Allergies  Allergen Reactions  . Amoxicillin Swelling    Presumed angioedema of lips due to prednisone    Social History:  reports that he quit smoking about 26 years ago. His smoking use included cigarettes. He  has a 16.00 pack-year smoking history. He has never used smokeless tobacco. He reports that he does not drink alcohol and does not use drugs.  Family History: Family History  Problem Relation Age of Onset  . Hypertension Father   . CVA Father        age 87  . Hyperlipidemia Father   . Other Mother        Deceased  . Healthy Sister   . Healthy Brother      Objective   Physical Exam: Blood pressure (!) 141/86, pulse 96, temperature 97.7 F (36.5 C), temperature source Oral, resp. rate (!) 22, height 5\' 7"  (1.702 m), weight 113.4 kg, SpO2 100 %.  Physical Exam Vitals and nursing note reviewed.  Constitutional:      Appearance: Normal appearance.  HENT:     Head: Normocephalic and atraumatic.  Eyes:     Conjunctiva/sclera: Conjunctivae normal.  Cardiovascular:      Rate and Rhythm: Normal rate and regular rhythm.  Pulmonary:     Effort: Pulmonary effort is normal.     Breath sounds: Normal breath sounds.  Abdominal:     General: Abdomen is flat.     Palpations: Abdomen is soft.  Musculoskeletal:        General: No swelling or tenderness.     Comments: Bilateral lower extremity lymphedema with compression stocking on left leg and right leg with wrap.  Right leg is erythematous and warm.  See pictures as below (pictures obtained from the patient's phone which were taken this afternoon in the clinic)  Right lateral hip with minimal point tenderness to palpation  Skin:    Coloration: Skin is not jaundiced or pale.  Neurological:     Mental Status: He is alert. Mental status is at baseline.  Psychiatric:        Mood and Affect: Mood normal. Affect is flat.        Behavior: Behavior normal.         LABS on Admission: I have personally reviewed all the labs and imaging below    Basic Metabolic Panel: Recent Labs  Lab 04/12/20 1331  NA 144  K 4.2  CL 103  CO2 28  GLUCOSE 96  BUN 26*  CREATININE 0.83  CALCIUM 9.3   Liver Function Tests: Recent Labs  Lab 04/12/20 1331  AST 13*  ALT <5  ALKPHOS 63  BILITOT 0.5  PROT 7.4  ALBUMIN 3.8   No results for input(s): LIPASE, AMYLASE in the last 168 hours. No results for input(s): AMMONIA in the last 168 hours. CBC: Recent Labs  Lab 04/12/20 1331  WBC 10.6*  NEUTROABS 7.6  HGB 15.1  HCT 47.5  MCV 96.7  PLT 190   Cardiac Enzymes: No results for input(s): CKTOTAL, CKMB, CKMBINDEX, TROPONINI in the last 168 hours. BNP: Invalid input(s): POCBNP CBG: No results for input(s): GLUCAP in the last 168 hours.  Radiological Exams on Admission:  DG Chest 2 View  Result Date: 04/12/2020 CLINICAL DATA:  Bilateral lower extremity swelling. EXAM: CHEST - 2 VIEW COMPARISON:  None. FINDINGS: The heart size and mediastinal contours are within normal limits. Both lungs are clear. The  visualized skeletal structures are unremarkable. IMPRESSION: No active cardiopulmonary disease. Electronically Signed   By: Marijo Conception M.D.   On: 04/12/2020 14:14      EKG: Independently reviewed.    A & P   Principal Problem:   Cellulitis Active Problems:   History of malignant melanoma.  left leg   Hyperlipidemia   Essential hypertension   Parkinson's plus syndrome ([place])   1. RLE nonpurulent cellulitis in the setting of chronic bilateral lower extremity lymphedema, failed outpatient therapy with unknown antibiotic a. Has a recent history of LLE wound infection with MSSA and Pseudomonas b. Currently afebrile, hemodynamically stable with mild leukocytosis c. Start cefepime d. WOCN  2. Parkinson's disease a. Continue home carbidopa levodopa  3. Hyperlipidemia a. Continue Crestor   DVT prophylaxis: Lovenox   Code Status: Not on file  Diet: Will consult report ended up being a phone consult   Family Communication: Admission, patients condition and plan of care including tests being ordered have been discussed with the patient who indicates understanding and agrees with the plan and Code Status. Patient's wife was updated  Disposition Plan: The appropriate patient status for this patient is INPATIENT. Inpatient status is judged to be reasonable and necessary in order to provide the required intensity of service to ensure the patient's safety. The patient's presenting symptoms, physical exam findings, and initial radiographic and laboratory data in the context of their chronic comorbidities is felt to place them at high risk for further clinical deterioration. Furthermore, it is not anticipated that the patient will be medically stable for discharge from the hospital within 2 midnights of admission. The following factors support the patient status of inpatient.   " The patient's presenting symptoms include right lower extremity warmth, swelling, lymphedema redness " The  worrisome physical exam findings include bilateral lymphedema with cellulitis " The initial radiographic and laboratory data are worrisome because of leukocytosis 10. " The chronic co-morbidities include chronic lymphedema and lower extremity wounds.   * I certify that at the point of admission it is my clinical judgment that the patient will require inpatient hospital care spanning beyond 2 midnights from the point of admission due to high intensity of service, high risk for further deterioration and high frequency of surveillance required.*  Consultants  WOCN  Procedures  . None  Time Spent on Admission: 65 minutes    Harold Hedge, DO [facility] Pager [phone] 04/12/2020, 4:46 PM

## 2020-04-13 DIAGNOSIS — E669 Obesity, unspecified: Secondary | ICD-10-CM

## 2020-04-13 DIAGNOSIS — I890 Lymphedema, not elsewhere classified: Secondary | ICD-10-CM

## 2020-04-13 DIAGNOSIS — L97919 Non-pressure chronic ulcer of unspecified part of right lower leg with unspecified severity: Secondary | ICD-10-CM

## 2020-04-13 LAB — BASIC METABOLIC PANEL
Anion gap: 8 (ref 5–15)
BUN: 23 mg/dL (ref 8–23)
CO2: 25 mmol/L (ref 22–32)
Calcium: 8.5 mg/dL — ABNORMAL LOW (ref 8.9–10.3)
Chloride: 106 mmol/L (ref 98–111)
Creatinine, Ser: 0.72 mg/dL (ref 0.61–1.24)
GFR calc Af Amer: >60 mL/min (ref >60)
GFR calc non Af Amer: >60 mL/min (ref >60)
Glucose, Bld: 97 mg/dL (ref 70–99)
Potassium: 4 mmol/L (ref 3.5–5.1)
Sodium: 139 mmol/L (ref 135–145)

## 2020-04-13 LAB — CBC
HCT: 40.8 % (ref 39.0–52.0)
Hemoglobin: 12.9 g/dL — ABNORMAL LOW (ref 13.0–17.0)
MCH: 30.6 pg (ref 26.0–34.0)
MCHC: 31.6 g/dL (ref 30.0–36.0)
MCV: 96.9 fL (ref 80.0–100.0)
Platelets: 163 10*3/uL (ref 150–400)
RBC: 4.21 MIL/uL — ABNORMAL LOW (ref 4.22–5.81)
RDW: 13.7 % (ref 11.5–15.5)
WBC: 7.6 10*3/uL (ref 4.0–10.5)
nRBC: 0 % (ref 0.0–0.2)

## 2020-04-13 NOTE — Progress Notes (Signed)
Nicholas [name] ([id number]) , Visit Report for 04/12/2020 Arrival Information Details Patient Name: Date of Service: Nicholas [name] DFO RD K. 04/12/2020 11:15 A M Medical Record Number: [id number] Patient Account Number: [id number] Date of Birth/Sex: Treating RN: [birth year] (74 y.o. Nicholas [name] Primary Care Nicholas [name]: Nicholas [name] Other Clinician: Referring Nicholas [name]: Treating Hesston Hitchens/Extender: [name] in Treatment: 53 Visit Information History Since Last Visit Added or deleted any medications: No Patient Arrived: Wheel Chair Any new allergies or adverse reactions: No Arrival Time: 11:19 Had a fall or experienced change in No Accompanied By: self activities of daily living that may affect Transfer Assistance: Manual risk of falls: Patient Identification Verified: Yes Signs or symptoms of abuse/neglect since last visito No Secondary Verification Process Completed: Yes Hospitalized since last visit: No Patient Requires Transmission-Based Precautions: No Implantable device outside of the clinic excluding No Patient Has Alerts: Yes cellular tissue based products placed in the center Patient Alerts: ABI:  07/2018 since last visit: Has Dressing in Place as Prescribed: Yes Has Compression in Place as Prescribed: Yes Pain Present Now: Yes Electronic Signature(s) Signed: 04/12/2020 1:33:30 PM By: Kela Millin Entered By: Kela Millin on 04/12/2020 11:45:43 -------------------------------------------------------------------------------- Clinic Level of Care Assessment Details Patient Name: Date of Service: Nicholas [name] DFO RD K. 04/12/2020 11:15 A M Medical Record Number: [id number] Patient Account Number: [id number] Date of Birth/Sex: Treating RN: [birth year] (75 y.o. Nicholas [name] Primary Care Nicholas [name]: Nicholas [name] Other Clinician: Referring Nicholas [name]: Treating Nicholas [name]/Extender: [name] in  Treatment: 13 Clinic Level of Care Assessment Items TOOL 4 Quantity Score X- 1 0 Use when only an EandM is performed on FOLLOW-UP visit ASSESSMENTS - Nursing Assessment / Reassessment X- 1 10 Reassessment of Co-morbidities (includes updates in patient status) X- 1 5 Reassessment of Adherence to Treatment Plan ASSESSMENTS - Wound and Skin A ssessment / Reassessment X - Simple Wound Assessment / Reassessment - one wound 1 5 '[]'  - 0 Complex Wound Assessment / Reassessment - multiple wounds '[]'  - 0 Dermatologic / Skin Assessment (not related to wound area) ASSESSMENTS - Focused Assessment '[]'  - 0 Circumferential Edema Measurements - multi extremities '[]'  - 0 Nutritional Assessment / Counseling / Intervention X- 1 5 Lower Extremity Assessment (monofilament, tuning fork, pulses) '[]'  - 0 Peripheral Arterial Disease Assessment (using hand held doppler) ASSESSMENTS - Ostomy and/or Continence Assessment and Care '[]'  - 0 Incontinence Assessment and Management '[]'  - 0 Ostomy Care Assessment and Management (repouching, etc.) PROCESS - Coordination of Care X - Simple Patient / Family Education for ongoing care 1 15 '[]'  - 0 Complex (extensive) Patient / Family Education for ongoing care X- 1 10 Staff obtains Programmer, systems, Records, T Results / Process Orders est '[]'  - 0 Staff telephones HHA, Nursing Homes / Clarify orders / etc '[]'  - 0 Routine Transfer to another Facility (non-emergent condition) X- 1 10 Routine Hospital Admission (non-emergent condition) '[]'  - 0 New Admissions / Biomedical engineer / Ordering NPWT Apligraf, etc. , '[]'  - 0 Emergency Hospital Admission (emergent condition) X- 1 10 Simple Discharge Coordination '[]'  - 0 Complex (extensive) Discharge Coordination PROCESS - Special Needs '[]'  - 0 Pediatric / Minor Patient Management '[]'  - 0 Isolation Patient Management '[]'  - 0 Hearing / Language / Visual special needs '[]'  - 0 Assessment of Community assistance (transportation,  D/C planning, etc.) '[]'  - 0 Additional assistance / Altered mentation '[]'  - 0 Support Surface(s) Assessment (bed, cushion, seat, etc.) INTERVENTIONS - Wound Cleansing /  Measurement X - Simple Wound Cleansing - one wound 1 5 '[]'  - 0 Complex Wound Cleansing - multiple wounds X- 1 5 Wound Imaging (photographs - any number of wounds) '[]'  - 0 Wound Tracing (instead of photographs) X- 1 5 Simple Wound Measurement - one wound '[]'  - 0 Complex Wound Measurement - multiple wounds INTERVENTIONS - Wound Dressings '[]'  - 0 Small Wound Dressing one or multiple wounds '[]'  - 0 Medium Wound Dressing one or multiple wounds X- 1 20 Large Wound Dressing one or multiple wounds X- 1 5 Application of Medications - topical '[]'  - 0 Application of Medications - injection INTERVENTIONS - Miscellaneous '[]'  - 0 External ear exam '[]'  - 0 Specimen Collection (cultures, biopsies, blood, body fluids, etc.) '[]'  - 0 Specimen(s) / Culture(s) sent or taken to Lab for analysis '[]'  - 0 Patient Transfer (multiple staff / Civil Service fast streamer / Similar devices) '[]'  - 0 Simple Staple / Suture removal (25 or less) '[]'  - 0 Complex Staple / Suture removal (26 or more) '[]'  - 0 Hypo / Hyperglycemic Management (close monitor of Blood Glucose) '[]'  - 0 Ankle / Brachial Index (ABI) - do not check if billed separately X- 1 5 Vital Signs Has the patient been seen at the hospital within the last three years: Yes Total Score: 115 Level Of Care: New/Established - Level 3 Electronic Signature(s) Signed: 04/12/2020 5:09:36 PM By: Levan Hurst RN, BSN Entered By: Levan Hurst on 04/12/2020 14:26:32 -------------------------------------------------------------------------------- Encounter Discharge Information Details Patient Name: Date of Service: Nicholas [name] DFO RD K. 04/12/2020 11:15 A M Medical Record Number: [id number] Patient Account Number: [id number] Date of Birth/Sex: Treating RN: 08/18/1945 (74 y.o. Nicholas [name] Primary Care  Nicholas [name]: Nicholas [name] Other Clinician: Referring Jalin Alicea: Treating Nicholas [name]/Extender: [name] in Treatment: 13 Encounter Discharge Information Items Discharge Condition: Stable Ambulatory Status: Wheelchair Discharge Destination: Emergency Room Telephoned: No Orders Sent: Yes Transportation: Private Auto Accompanied By: caregiver Schedule Follow-up Appointment: Yes Clinical Summary of Care: Electronic Signature(s) Signed: 04/12/2020 5:10:33 PM By: Deon Pilling Entered By: Deon Pilling on 04/12/2020 12:32:27 -------------------------------------------------------------------------------- Lower Extremity Assessment Details Patient Name: Date of Service: Nicholas [name] DFO RD K. 04/12/2020 11:15 A M Medical Record Number: [id number] Patient Account Number: [id number] Date of Birth/Sex: Treating RN: [birth year] (74 y.o. Nicholas [name] Primary Care Glade Strausser: Nicholas [name] Other Clinician: Referring Lynsee Wands: Treating Benelli Winther/Extender: [name] in Treatment: 13 Edema Assessment Assessed: [name]: No] [name]: No] Edema: [Left: Ye] [Right: s] Calf Left: Right: Point of Measurement: 40 cm From Medial Instep cm 54 cm Ankle Left: Right: Point of Measurement: 19 cm From Medial Instep cm 39 cm Vascular Assessment Pulses: Dorsalis Pedis Palpable: [Right:No] Electronic Signature(s) Signed: 04/12/2020 1:33:30 PM By: Kela Millin Entered By: Kela Millin on 04/12/2020 11:46:25 -------------------------------------------------------------------------------- Multi Wound Chart Details Patient Name: Date of Service: Nicholas [name] DFO RD K. 04/12/2020 11:15 A M Medical Record Number: [id number] Patient Account Number: [id number] Date of Birth/Sex: Treating RN: May 02, 1946 (74 y.o. Nicholas [name] Primary Care Jurnie Garritano: Nicholas [name] Other Clinician: Referring Tiffaney Heimann: Treating Carling Liberman/Extender: [name] in Treatment: 13 Vital Signs Height(in): 67 Pulse(bpm): 92 Weight(lbs): 250 Blood Pressure(mmHg): 130/79 Body Mass Index(BMI): 39 Temperature(F): 97.9 Respiratory Rate(breaths/min): 18 Photos: [17:No Photos Right, Lateral Lower Leg] [18:No Photos Right, Lateral Ankle] [19:No Photos Right, Medial, Dorsal Foot] Wound Location: [17:Gradually Appeared] [18:Gradually Appeared] [19:Blister] Wounding Event: [17:Lymphedema] [18:Lymphedema] [19:Lymphedema] Primary Etiology: [17:Lymphedema, Sleep Apnea,] [18:Lymphedema, Sleep Apnea,] [19:Lymphedema, Sleep  Apnea,] Comorbid History: [17:Congestive Heart Failure, Hypertension, Peripheral Venous Disease, Received Chemotherapy 04/03/2020] [18:Congestive Heart Failure, Hypertension, Peripheral Venous Disease, Received Chemotherapy 04/03/2020] [19:Congestive Heart  Failure, Hypertension, Peripheral Venous Disease, Received Chemotherapy 04/03/2020] Date Acquired: [17:0] [18:0] [19:0] Weeks of Treatment: [17:Converted] [18:Converted] [19:Converted] Wound Status: [17:5.5x5.5x0.1] [18:0x0x0] [19:0x0x0] Measurements L x W x D (cm) [17:23.758] [18:0] [19:0] A (cm) : rea [17:2.376] [18:0] [19:0] Volume (cm) : [17:0.00%] [18:100.00%] [19:100.00%] % Reduction in Area: [17:0.00%] [18:100.00%] [19:100.00%] % Reduction in Volume: [17:Full Thickness Without Exposed] [18:Partial Thickness] [19:Partial Thickness] Classification: [17:Support Structures Large] [18:Large] [19:Large] Exudate Amount: [17:Purulent] [18:Purulent] [19:Purulent] Exudate Type: [17:yellow, brown, green] [18:yellow, brown, green] [19:yellow, brown, green] Exudate Color: [17:No] [18:No] [19:No] Foul Odor A Cleansing: [17:fter N/A] [18:N/A] [19:N/A] Odor Anticipated Due to Product Use: [17:Flat and Intact] [18:Indistinct, nonvisible] [19:Flat and Intact] Wound Margin: [17:Medium (34-66%)] [18:Small (1-33%)] [19:Large (67-100%)] Granulation A mount: [17:Red]  [18:Pink] [19:Pink] Granulation Quality: [17:Medium (34-66%)] [18:Small (1-33%)] [19:None Present (0%)] Necrotic Amount: [17:Fat Layer (Subcutaneous Tissue): Yes Fat Layer (Subcutaneous Tissue): Yes Fat Layer (Subcutaneous Tissue): Yes] Exposed Structures: [17:Fascia: No Tendon: No Muscle: No Joint: No Bone: No None] [18:Fascia: No Tendon: No Muscle: No Joint: No Bone: No Small (1-33%)] [19:Fascia: No Tendon: No Muscle: No Joint: No Bone: No Small (1-33%)] Wound Number: 20 N/A N/A Photos: No Photos N/A N/A Right, Circumferential Lower Leg N/A N/A Wound Location: Gradually Appeared N/A N/A Wounding Event: Lymphedema N/A N/A Primary Etiology: Lymphedema, Sleep Apnea, N/A N/A Comorbid History: Congestive Heart Failure, Hypertension, Peripheral Venous Disease, Received Chemotherapy 04/08/2020 N/A N/A Date Acquired: 0 N/A N/A Weeks of Treatment: Open N/A N/A Wound Status: 15x44x0.1 N/A N/A Measurements L x W x D (cm) 518.363 N/A N/A A (cm) : rea 51.836 N/A N/A Volume (cm) : N/A N/A N/A % Reduction in Area: N/A N/A N/A % Reduction in Volume: Full Thickness Without Exposed N/A N/A Classification: Support Structures Large N/A N/A Exudate Amount: Purulent N/A N/A Exudate Type: yellow, brown, green N/A N/A Exudate Color: Yes N/A N/A Foul Odor A Cleansing: fter No N/A N/A Odor Anticipated Due to Product Use: Distinct, outline attached N/A N/A Wound Margin: Large (67-100%) N/A N/A Granulation A mount: Red, Pink N/A N/A Granulation Quality: Small (1-33%) N/A N/A Necrotic Amount: Fat Layer (Subcutaneous Tissue): Yes N/A N/A Exposed Structures: Fascia: No Tendon: No Muscle: No Joint: No Bone: No None N/A N/A Epithelialization: Treatment Notes Wound #20 (Right, Circumferential Lower Leg) 1. Cleanse With Wound Cleanser Soap and water 3. Primary Dressing Applied Xeroform Gauze 4. Secondary Dressing ABD Pad Roll Gauze 5. Secured With Tape Notes patient  being transported via wheelchair to ED by staff member. Electronic Signature(s) Signed: 04/12/2020 5:09:36 PM By: Levan Hurst RN, BSN Signed: 04/13/2020 4:19:57 PM By: Linton Ham MD Entered By: Linton Ham on 04/12/2020 13:05:28 -------------------------------------------------------------------------------- Multi-Disciplinary Care Plan Details Patient Name: Date of Service: Nicholas [name] DFO RD K. 04/12/2020 11:15 A M Medical Record Number: [id number] Patient Account Number: [id number] Date of Birth/Sex: Treating RN: 01-25-1946 (74 y.o. Nicholas [name] Primary Care Phat Dalton: Nicholas [name] Other Clinician: Referring Avira Tillison: Treating Howie Rufus/Extender: [name] in Treatment: 13 Active Inactive Wound/Skin Impairment Nursing Diagnoses: Knowledge deficit related to ulceration/compromised skin integrity Goals: Patient/caregiver will verbalize understanding of skin care regimen Date Initiated: 01/08/2020 Target Resolution Date: 05/03/2020 Goal Status: Active Ulcer/skin breakdown will have a volume reduction of 30% by week 4 Date Initiated: 01/08/2020 Date Inactivated: 02/05/2020 Target Resolution Date: 01/30/2020 Goal Status: Met Interventions: Assess patient/caregiver ability  to obtain necessary supplies Assess patient/caregiver ability to perform ulcer/skin care regimen upon admission and as needed Provide education on ulcer and skin care Treatment Activities: Skin care regimen initiated : 01/08/2020 Topical wound management initiated : 01/08/2020 Notes: Electronic Signature(s) Signed: 04/12/2020 5:09:36 PM By: Levan Hurst RN, BSN Entered By: Levan Hurst on 04/12/2020 14:25:38 -------------------------------------------------------------------------------- Pain Assessment Details Patient Name: Date of Service: Nicholas [name] DFO RD K. 04/12/2020 11:15 A M Medical Record Number: [id number] Patient Account Number: [id number] Date of  Birth/Sex: Treating RN: 1945-11-03 (74 y.o. Nicholas [name] Primary Care Onesti Bonfiglio: Nicholas [name] Other Clinician: Referring Aneudy Champlain: Treating Faithann Natal/Extender: [name] in Treatment: 13 Active Problems Location of Pain Severity and Description of Pain Patient Has Paino Yes Site Locations Pain Location: Pain in Ulcers With Dressing Change: Yes Duration of the Pain. Constant / Intermittento Constant Rate the pain. Current Pain Level: 4 Worst Pain Level: 8 Least Pain Level: 4 Tolerable Pain Level: 4 Character of Pain Describe the Pain: Aching, Burning Pain Management and Medication Current Pain Management: Electronic Signature(s) Signed: 04/12/2020 1:33:30 PM By: Kela Millin Entered By: Kela Millin on 04/12/2020 11:46:04 -------------------------------------------------------------------------------- Patient/Caregiver Education Details Patient Name: Date of Service: Nicholas [name] DFO RD K. 9/20/2021andnbsp11:15 A M Medical Record Number: [id number] Patient Account Number: [id number] Date of Birth/Gender: Treating RN: Jun 25, 1946 (74 y.o. Nicholas [name] Primary Care Physician: Nicholas [name] Other Clinician: Referring Physician: Treating Physician/Extender: [name] in Treatment: 13 Education Assessment Education Provided To: Patient Education Topics Provided Wound/Skin Impairment: Methods: Explain/Verbal Responses: State content correctly [employer]) Signed: 04/12/2020 5:09:36 PM By: Levan Hurst RN, BSN Entered By: Levan Hurst on 04/12/2020 14:25:49 -------------------------------------------------------------------------------- Wound Assessment Details Patient Name: Date of Service: Nicholas [name] DFO RD K. 04/12/2020 11:15 A M Medical Record Number: [id number] Patient Account Number: [id number] Date of Birth/Sex: Treating RN: Feb 08, 1946 (74 y.o. Nicholas [name] Primary Care Sheyla Zaffino: Nicholas [name] Other Clinician: Referring Jullianna Gabor: Treating Lorenza Shakir/Extender: [name] in Treatment: 13 Wound Status Wound Number: 17 Primary Lymphedema Etiology: Wound Location: Right, Lateral Lower Leg Wound Converted Wounding Event: Gradually Appeared Status: Date Acquired: 04/03/2020 Comorbid Lymphedema, Sleep Apnea, Congestive Heart Failure, Weeks Of Treatment: 0 History: Hypertension, Peripheral Venous Disease, Received Chemotherapy Clustered Wound: No Wound Measurements Length: (cm) 5.5 Width: (cm) 5.5 Depth: (cm) 0.1 Area: (cm) 23.758 Volume: (cm) 2.376 % Reduction in Area: 0% % Reduction in Volume: 0% Epithelialization: None Tunneling: No Undermining: No Wound Description Classification: Full Thickness Without Exposed Support Structures Wound Margin: Flat and Intact Exudate Amount: Large Exudate Type: Purulent Exudate Color: yellow, brown, green Foul Odor After Cleansing: No Slough/Fibrino Yes Wound Bed Granulation Amount: Medium (34-66%) Exposed Structure Granulation Quality: Red Fascia Exposed: No Necrotic Amount: Medium (34-66%) Fat Layer (Subcutaneous Tissue) Exposed: Yes Necrotic Quality: Adherent Slough Tendon Exposed: No Muscle Exposed: No Joint Exposed: No Bone Exposed: No Electronic Signature(s) Signed: 04/12/2020 1:33:30 PM By: Kela Millin Entered By: Kela Millin on 04/12/2020 11:49:00 -------------------------------------------------------------------------------- Wound Assessment Details Patient Name: Date of Service: Nicholas [name] DFO RD K. 04/12/2020 11:15 A M Medical Record Number: [id number] Patient Account Number: [id number] Date of Birth/Sex: Treating RN: 1945/12/28 (74 y.o. Nicholas [name] Primary Care Jerzy Crotteau: Nicholas [name] Other Clinician: Referring Harmony Sandell: Treating Jhordan Kinter/Extender: [name] in Treatment:  13 Wound Status Wound Number: 18 Primary Lymphedema Etiology: Wound Location: Right, Lateral Ankle Wound Converted Wounding Event: Gradually Appeared Status: Date Acquired: 04/03/2020 Comorbid Lymphedema,  Sleep Apnea, Congestive Heart Failure, Weeks Of Treatment: 0 History: Hypertension, Peripheral Venous Disease, Received Chemotherapy Clustered Wound: No Wound Measurements Length: (cm) Width: (cm) Depth: (cm) Area: (cm) Volume: (cm) 0 % Reduction in Area: 100% 0 % Reduction in Volume: 100% 0 Epithelialization: Small (1-33%) 0 Tunneling: No 0 Undermining: No Wound Description Classification: Partial Thickness Wound Margin: Indistinct, nonvisible Exudate Amount: Large Exudate Type: Purulent Exudate Color: yellow, brown, green Foul Odor After Cleansing: No Slough/Fibrino Yes Wound Bed Granulation Amount: Small (1-33%) Exposed Structure Granulation Quality: Pink Fascia Exposed: No Necrotic Amount: Small (1-33%) Fat Layer (Subcutaneous Tissue) Exposed: Yes Necrotic Quality: Adherent Slough Tendon Exposed: No Muscle Exposed: No Joint Exposed: No Bone Exposed: No Electronic Signature(s) Signed: 04/12/2020 1:33:30 PM By: Kela Millin Entered By: Kela Millin on 04/12/2020 11:47:06 -------------------------------------------------------------------------------- Wound Assessment Details Patient Name: Date of Service: Nicholas [name] DFO RD K. 04/12/2020 11:15 A M Medical Record Number: [id number] Patient Account Number: [id number] Date of Birth/Sex: Treating RN: [birth year] (74 y.o. Nicholas [name] Primary Care Irene Mitcham: Nicholas [name] Other Clinician: Referring Zylah Elsbernd: Treating Randle Shatzer/Extender: [name] in Treatment: 13 Wound Status Wound Number: 19 Primary Lymphedema Etiology: Wound Location: Right, Medial, Dorsal Foot Wound Converted Wounding Event: Blister Status: Date Acquired: 04/03/2020 Comorbid Lymphedema,  Sleep Apnea, Congestive Heart Failure, Weeks Of Treatment: 0 History: Hypertension, Peripheral Venous Disease, Received Chemotherapy Clustered Wound: No Wound Measurements Length: (cm) Width: (cm) Depth: (cm) Area: (cm) Volume: (cm) 0 % Reduction in Area: 100% 0 % Reduction in Volume: 100% 0 Epithelialization: Small (1-33%) 0 Tunneling: No 0 Undermining: No Wound Description Classification: Partial Thickness Wound Margin: Flat and Intact Exudate Amount: Large Exudate Type: Purulent Exudate Color: yellow, brown, green Foul Odor After Cleansing: No Slough/Fibrino No Wound Bed Granulation Amount: Large (67-100%) Exposed Structure Granulation Quality: Pink Fascia Exposed: No Necrotic Amount: None Present (0%) Fat Layer (Subcutaneous Tissue) Exposed: Yes Tendon Exposed: No Muscle Exposed: No Joint Exposed: No Bone Exposed: No Electronic Signature(s) Signed: 04/12/2020 1:33:30 PM By: Kela Millin Entered By: Kela Millin on 04/12/2020 11:47:29 -------------------------------------------------------------------------------- Wound Assessment Details Patient Name: Date of Service: Nicholas [name] DFO RD K. 04/12/2020 11:15 A M Medical Record Number: [id number] Patient Account Number: [id number] Date of Birth/Sex: Treating RN: 1946/02/24 (74 y.o. Nicholas [name] Primary Care Juanelle Trueheart: Nicholas [name] Other Clinician: Referring Esgar Barnick: Treating Godwin Tedesco/Extender: [name] in Treatment: 13 Wound Status Wound Number: 20 Primary Lymphedema Etiology: Wound Location: Right, Circumferential Lower Leg Wound Open Wounding Event: Gradually Appeared Status: Date Acquired: 04/08/2020 Comorbid Lymphedema, Sleep Apnea, Congestive Heart Failure, Weeks Of Treatment: 0 History: Hypertension, Peripheral Venous Disease, Received Clustered Wound: No Clustered Wound: No Chemotherapy Wound Measurements Length: (cm) 15 Width: (cm) 44 Depth:  (cm) 0.1 Area: (cm) 518.363 Volume: (cm) 51.836 % Reduction in Area: % Reduction in Volume: Epithelialization: None Tunneling: No Undermining: No Wound Description Classification: Full Thickness Without Exposed Support Structures Wound Margin: Distinct, outline attached Exudate Amount: Large Exudate Type: Purulent Exudate Color: yellow, brown, green Foul Odor After Cleansing: Yes Due to Product Use: No Slough/Fibrino Yes Wound Bed Granulation Amount: Large (67-100%) Exposed Structure Granulation Quality: Red, Pink Fascia Exposed: No Necrotic Amount: Small (1-33%) Fat Layer (Subcutaneous Tissue) Exposed: Yes Necrotic Quality: Adherent Slough Tendon Exposed: No Muscle Exposed: No Joint Exposed: No Bone Exposed: No Treatment Notes Wound #20 (Right, Circumferential Lower Leg) 1. Cleanse With Wound Cleanser Soap and water 3. Primary Dressing Applied Xeroform Gauze 4. Secondary Dressing ABD Pad Roll Gauze 5.  Secured With Tape Notes patient being transported via wheelchair to ED by staff member. Electronic Signature(s) Signed: 04/12/2020 1:33:30 PM By: Kela Millin Entered By: Kela Millin on 04/12/2020 11:51:00 -------------------------------------------------------------------------------- Vitals Details Patient Name: Date of Service: Nicholas [name] DFO RD K. 04/12/2020 11:15 A M Medical Record Number: [id number] Patient Account Number: [id number] Date of Birth/Sex: Treating RN: 25-Oct-1945 (74 y.o. Nicholas [name] Primary Care Makalia Bare: Nicholas [name] Other Clinician: Referring Mickey Esguerra: Treating Asir Bingley/Extender: [name] in Treatment: 13 Vital Signs Time Taken: 11:28 Temperature (F): 97.9 Height (in): 67 Pulse (bpm): 92 Weight (lbs): 250 Respiratory Rate (breaths/min): 18 Body Mass Index (BMI): 39.2 Blood Pressure (mmHg): 130/79 Reference Range: 80 - 120 mg / dl Electronic Signature(s) Signed: 04/12/2020  1:33:30 PM By: Kela Millin Entered By: Kela Millin on 04/12/2020 11:28:51

## 2020-04-13 NOTE — Consult Note (Addendum)
WOC Nurse Consult Note: Reason for Consult:Lymphedema to bilateral lower legs.  Cellulitis to right leg.  Left leg edematous and intact.  Scarring to LLE from previous ulcerations.  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  Patient states he cannot tolerate compression to right leg at this time.  Educated that antibiotics are treating the infection but that compression will be imperative to resolving open wounds.  Has Juxtalite for LLE.  Cleansed leg, applied stockingette and Juxtalite for compression.  Right lower leg:  Cleanse with NS and pat dry. Apply Aquacel Ag ([facility] # [id number])  to open wounds on RLE. Cover with ABD pad and kerlix. Change Tuesday and Friday.   Dressing procedure/placement/frequency: Will not follow at this time.  Please re-consult if needed.  Domenic Moras MSN, RN, FNP-BC [facility] Wound, Ostomy, Continence Nurse Pager [phone]

## 2020-04-13 NOTE — Progress Notes (Signed)
PROGRESS NOTE  OAK[place] FANT EHO:[id number] DOB: May 15, 1946   PCP: [name], MD  Patient is from: Home.  Uses walker at baseline.  Lives with his wife.  DOA: 04/12/2020 LOS: 1  Brief Narrative / Interim history: 74 year old male with history of Parkinson's disease, melanoma status post inguinal lymphadenectomy with subsequent chronic lymphedema and chronic wound followed at wound clinic presenting with worsening RLE redness and swelling for the past 3 weeks.  Reportedly failed p.o. antibiotics outpatient and sent to ED by wound care doctor.  No constitutional symptoms.  In ED, hemodynamically stable.  WBC 10.6.  Otherwise, CBC with differential and CMP without significant finding.  Lactic acid negative.  COVID-19 PCR negative.  Two view CXR negative.  Patient was admitted for RLE nonpurulent cellulitis and started on IV cefepime given history of Pseudomonas.   Subjective: Seen and examined earlier this morning.  No major events overnight of this morning.  Endorses some pain in his right groin laterally.  Worse with movement.  No numbness or tingling.  Denies fever, chills, GI or UTI symptoms.  Objective: Vitals:   04/13/20 0200 04/13/20 0618 04/13/20 0936 04/13/20 1351  BP: 124/63 137/69 127/76 125/74  Pulse: 94 82 89 83  Resp: 17 18 16 18   Temp: 99.2 F (37.3 C) 98.7 F (37.1 C) 98.3 F (36.8 C) 98.9 F (37.2 C)  TempSrc:  Oral Oral Oral  SpO2: 96% 96% 97% 99%  Weight:      Height:        Intake/Output Summary (Last 24 hours) at 04/13/2020 1434 Last data filed at 04/13/2020 1400 Gross per 24 hour  Intake 1880.4 ml  Output 1050 ml  Net 830.4 ml   Filed Weights   04/12/20 1249  Weight: 113.4 kg    Examination:  GENERAL: No apparent distress.  Nontoxic. HEENT: MMM.  Vision and hearing grossly intact.  NECK: Supple.  No apparent JVD.  RESP: On room air.  No IWOB.  Fair aeration bilaterally. CVS:  RRR. Heart sounds normal.  ABD/GI/GU: BS+. Abd soft, NTND.    MSK/EXT:  Moves extremities.  Lymphedema bilaterally.  No swelling, tenderness or erythema in his right groin.  Fair and painless range of motion in right hip. SKIN: Bilateral lymphedema.  Dressing over RLE DCI. NEURO: Awake, alert and oriented appropriately.  No apparent focal neuro deficit. PSYCH: Calm. Normal affect.  Procedures:  None  Microbiology summarized: COVID-19 PCR negative. Superficial wound culture ordered.  Assessment & Plan: RLE nonpurulent cellulitis due to chronic bilateral LE lymphedema: Failed outpatient treatment with p.o. antibiotics and sent to ED by his wound care doctor.  History of Pseudomonas -Continue IV cefepime -We will obtain superficial wound cultures-would help guiding antibiotic de-escalation -Appreciate WOCN input and help  History of Parkinson disease: Stable. -Continue Sinemet  Essential hypertension?  Normotensive.  Does not appear to be on meds. -Continue monitoring  Hyperlipidemia -Continue Crestor  Bilateral lower extremity lymphedema -RLE as below -LLE-Cleansed leg, applied stockingette and Juxtalite for compression.  Right lower extremity wound/ulceration: POA. -Appreciate WOCN input -Cleanse with NS and pat dry. Apply Aquacel Ag to open wounds on RLE. Cover with ABD pad & kerlix.  -Change Tuesday and Friday  Class II obesity Body mass index is 39.16 kg/m.         DVT prophylaxis:  Subcu Lovenox  Code Status: DNR/DNI Family Communication: Patient and/or RN.  Attempted to call patient's wife but no answer. Status is: Inpatient  Remains inpatient appropriate because:Unsafe d/c plan,  IV treatments appropriate due to intensity of illness or inability to take PO and Inpatient level of care appropriate due to severity of illness   Dispo: The patient is from: Home              Anticipated d/c is to: Home              Anticipated d/c date is: 2 days              Patient currently is not medically stable to  d/c.       Consultants:  WOCN   Sch Meds:  Scheduled Meds: . carbidopa-levodopa  2 tablet Oral 4 times per day  . enoxaparin (LOVENOX) injection  55 mg Subcutaneous Q24H  . rosuvastatin  10 mg Oral Daily   Continuous Infusions: . sodium chloride 250 mL (04/12/20 1733)  . ceFEPime (MAXIPIME) IV 2 g (04/13/20 1136)   PRN Meds:.sodium chloride, acetaminophen, morphine injection  Antimicrobials: Anti-infectives (From admission, onward)   Start     Dose/Rate Route Frequency Ordered Stop   04/13/20 0200  ceFEPIme (MAXIPIME) 2 g in sodium chloride 0.9 % 100 mL IVPB        2 g 200 mL/hr over 30 Minutes Intravenous Every 8 hours 04/12/20 1618     04/12/20 1615  ceFEPIme (MAXIPIME) 2 g in sodium chloride 0.9 % 100 mL IVPB        2 g 200 mL/hr over 30 Minutes Intravenous  Once 04/12/20 1614 04/12/20 1930   04/12/20 1415  vancomycin (VANCOREADY) IVPB 2000 mg/400 mL        2,000 mg 200 mL/hr over 120 Minutes Intravenous  Once 04/12/20 1357 04/12/20 1652       I have personally reviewed the following labs and images: CBC: Recent Labs  Lab 04/12/20 1331 04/13/20 0336  WBC 10.6* 7.6  NEUTROABS 7.6  --   HGB 15.1 12.9*  HCT 47.5 40.8  MCV 96.7 96.9  PLT 190 163   BMP &GFR Recent Labs  Lab 04/12/20 1331 04/13/20 0336  NA 144 139  K 4.2 4.0  CL 103 106  CO2 28 25  GLUCOSE 96 97  BUN 26* 23  CREATININE 0.83 0.72  CALCIUM 9.3 8.5*   Estimated Creatinine Clearance: 97.4 mL/min (by C-G formula based on SCr of 0.72 mg/dL). Liver & Pancreas: Recent Labs  Lab 04/12/20 1331  AST 13*  ALT <5  ALKPHOS 63  BILITOT 0.5  PROT 7.4  ALBUMIN 3.8   No results for input(s): LIPASE, AMYLASE in the last 168 hours. No results for input(s): AMMONIA in the last 168 hours. Diabetic: No results for input(s): HGBA1C in the last 72 hours. No results for input(s): GLUCAP in the last 168 hours. Cardiac Enzymes: No results for input(s): CKTOTAL, CKMB, CKMBINDEX, TROPONINI in the  last 168 hours. No results for input(s): PROBNP in the last 8760 hours. Coagulation Profile: No results for input(s): INR, PROTIME in the last 168 hours. Thyroid Function Tests: No results for input(s): TSH, T4TOTAL, FREET4, T3FREE, THYROIDAB in the last 72 hours. Lipid Profile: No results for input(s): CHOL, HDL, LDLCALC, TRIG, CHOLHDL, LDLDIRECT in the last 72 hours. Anemia Panel: No results for input(s): VITAMINB12, FOLATE, FERRITIN, TIBC, IRON, RETICCTPCT in the last 72 hours. Urine analysis:    Component Value Date/Time   COLORURINE YELLOW 04/12/2020 1536   APPEARANCEUR CLEAR 04/12/2020 1536   LABSPEC 1.035 (H) 04/12/2020 1536   PHURINE 5.0 04/12/2020 [place] 04/12/2020 1536  HGBUR NEGATIVE 04/12/2020 1536   BILIRUBINUR NEGATIVE 04/12/2020 1536   BILIRUBINUR n 02/09/2017 1214   KETONESUR 5 (A) 04/12/2020 1536   PROTEINUR NEGATIVE 04/12/2020 1536   UROBILINOGEN 0.2 02/09/2017 1214   NITRITE NEGATIVE 04/12/2020 [place] 04/12/2020 1536   Sepsis Labs: Invalid input(s): PROCALCITONIN, [place]  Microbiology: Recent Results (from the past 240 hour(s))  SARS Coronavirus 2 by RT PCR (hospital order, performed in [facility] hospital lab) Nasopharyngeal Nasopharyngeal Swab     Status: None   Collection Time: 04/12/20  3:00 PM   Specimen: Nasopharyngeal Swab  Result Value Ref Range Status   SARS Coronavirus 2 NEGATIVE NEGATIVE Final    Comment: (NOTE) SARS-CoV-2 target nucleic acids are NOT DETECTED.  The SARS-CoV-2 RNA is generally detectable in upper and lower respiratory specimens during the acute phase of infection. The lowest concentration of SARS-CoV-2 viral copies this assay can detect is 250 copies / mL. A negative result does not preclude SARS-CoV-2 infection and should not be used as the sole basis for treatment or other patient management decisions.  A negative result may occur with improper specimen collection / handling,  submission of specimen other than nasopharyngeal swab, presence of viral mutation(s) within the areas targeted by this assay, and inadequate number of viral copies (<250 copies / mL). A negative result must be combined with clinical observations, patient history, and epidemiological information.  Fact Sheet for Patients:   StrictlyIdeas.no  Fact Sheet for Healthcare Providers: BankingDealers.co.za  This test is not yet approved or  cleared by the [place] FDA and has been authorized for detection and/or diagnosis of SARS-CoV-2 by FDA under an Emergency Use Authorization (EUA).  This EUA will remain in effect (meaning this test can be used) for the duration of the COVID-19 declaration under Section 564(b)(1) of the Act, 21 U.S.C. section 360bbb-3(b)(1), unless the authorization is terminated or revoked sooner.  Performed at [facility], [place] [address], [place], [place] [address]     Radiology Studies: No results found.    Dorothy Landgrebe T. [place]  If 7PM-7AM, please contact night-coverage [email or web address] 04/13/2020, 2:34 PM

## 2020-04-13 NOTE — Progress Notes (Signed)
Materials was called to obtain dressing supplies for RLE, but the Kellie Simmering # was incorrect. At 1617, the [place] nurse was paged with no call back. Materials management were unable to send the correct aquacel ag. Thereasa Distance, CN notified.   Dressing change will need to be done on 9/21 (Wednesday) and WOC nurse paged again for correct information.

## 2020-04-13 NOTE — Plan of Care (Signed)
  Problem: Clinical Measurements: Goal: Respiratory complications will improve Outcome: Progressing   Problem: Clinical Measurements: Goal: Cardiovascular complication will be avoided Outcome: Progressing   Problem: Coping: Goal: Level of anxiety will decrease Outcome: Progressing   Problem: Elimination: Goal: Will not experience complications related to bowel motility Outcome: Progressing   Problem: Pain Managment: Goal: General experience of comfort will improve Outcome: Progressing   Problem: Safety: Goal: Ability to remain free from injury will improve Outcome: Progressing   Problem: Skin Integrity: Goal: Risk for impaired skin integrity will decrease Outcome: Progressing

## 2020-04-13 NOTE — Progress Notes (Signed)
Nicholas [name] ([id number]) , Visit Report for 04/12/2020 HPI Details Patient Name: Date of Service: Nicholas [name] DFO RD K. 04/12/2020 11:15 A M Medical Record Number: [id number] Patient Account Number: [id number] Date of Birth/Sex: Treating RN: 01-14-1946 (74 y.o. Nicholas [name] Primary Care Provider: Garret [name] Other Clinician: Referring Provider: Treating Provider/Extender: Nicholas [name] in Treatment: 13 History of Present Illness HPI Description: ADMISSION 07/04/2018 This is a 74 year old man who has bilateral lymphedema felt to be secondary to previous treatment for malignant melanoma on his left toes. 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 Malignant melanoma was treated with immunotherapy. He is not had radiation. Sometime after this he developed edema in the left leg that spread into the right leg. He has bilateral compression pumps at home and he uses them twice a day and claims to be quite compliant. He also has a juxta lite type stocking at home which is apparently 74-year-old. He states that he fell in early October dislocating and fracturing his left shoulder. He slept in a recliner with his legs dependent. He was seen and followed at the rehab clinic at [facility] and was having lymphedema wraps done by 1 of the PT who noted his open wounds last week and he has been referred here for management. The patient has 2 open wounds on the right s distal lower leg and one on the left anterior lower leg The patient is not a diabetic. 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 For one reason or another we could not get him set up with home health and he kept the wraps on all week which he generally seem to tolerate. He did not use his compression pumps over the top of the wraps which I told him he could do this week. He comes in with a new rash on both feet which looks like tinea 07/22/2018; much better edema control bilaterally. The wounds on the right lateral calf and left anterior lower leg both look a lot better. There is epithelialization over both surfaces but I think this is fragile and I am going to put him in our compression wraps for another week. He says he is been using his compression pumps at home. He has a constellation of lower extremity compression garments that he is going to bring in next week. In discussion with him I cannot really get a sense of everything he has. I think he was at one point prescribed 30-40 below-knee stockings but he points out he could not get them on. 07/29/2018; the patient arrives in his legs are healed. He has a constellation of stockings none of which I think are going to be that helpful except for the fact that he has external wraparound stockings of some description. He has above-knee stockings but I cannot imagine it would be possible to get these on READMISSION 01/08/2020 Patient is now a 74 year old man. We had him for a short period from December 2019 through January 2020. He has bilateral lower extremity lymphedema which he initially traces to treatment for malignant melanoma 7 years ago. He has been followed at the lymphedema clinic  on [employer] and recently has compression pumps that go up to his mid abdomen. He recently developed superficial wounds bilaterally. They will not manage these in the lymphedema clinic where the services are run by therapy. He has not been using his compression pumps. His significant other cannot get the stockings on his legs and the patient is only limited ability to help because  of Parkinson's disease Past medical history is essentially unchanged. He has sleep apnea, Parkinson's disease at some point labeled his Parkinson's plus, bilateral lymphedema, history of malignant melanoma. He does not have an arterial issue that I am aware of although his previous arterial studies have been noncompressible 7/1; patient we admitted the clinic 2 weeks ago. He has severe bilateral lymphedema. He had wounds predominantly on the left leg but also superficially on the right. He has compression pumps which he says he is using once a day. 7/15; 2-week follow-up. He comes in for nurse visit. He has severe bilateral lymphedema. He has upper abdominal level compression pumps which she is using twice a day. We have been wrapping both his lower legs the areas on the right leg are healed. He still has an area of left upper lateral and right anterior medial. Anterior medial wound is small. He is tolerating this well. 7/22; still significant lymphedema even with 4-layer compression and external compression pump usage. He said he had some itching on the right anterior leg he comes in with a large superficial area of skin breakdown anteriorly and a smaller area laterally on the right leg. He also has a rash on the left dorsal ankle crease. Some of these look pustular. I wonder if this is an occlusion issue from foam we put down here. He also states the only thing different this week is the did a 5-hour car ride with his legs dependent. There was nothing open on the right leg last week but we wrapped it in any case 7/27; culture of the blisters on his left anterior leg grew staph aureus. I gave him doxycycline which he started yesterday. He is already appear cleaner and dry. His major wound is on the right anterior lower leg and the right lateral lower leg. There is nothing else open on the left leg. He has better edema control today. 03/04/20-Patient comes in with new areas after he had a fall, on his  left dorsal foot and right dorsal foot and left medial proximal leg, there is significant amount of drainage into his dressing that was removed today which is actually greenish in color. It looks like he is completed a course of doxycycline for staph that grew from his blisters however he might need antipseudomonal coverage at this point 8/16; apparently the wounds originally were all healed until his last visit when after a fall he had areas on the left dorsal foot, right dorsal foot and left medial proximal leg. T oday all of these have healed and he has a small area on the left medial ankle. The patient has severe bilateral lymphedema which is managed usually with compression stockings/external compression stockings as well his mid abdomen compression pumps. He is still using the latter twice a day. 8/23; the wounds on his dorsal feet and medial ankle that he had from a fall last time of closed however he has a new wrap injury on the left medial upper leg and a smaller 1 anteriorly. He is using his compression pumps twice a day 8/30; nothing open on the left leg. He  is in his stocking. Using his compression pumps twice a day. From last week still a small opening in the medial upper leg on the right he has a new area in the upper mid tibia. He thinks this might have been a wrap injury looks more like the remanent of the blister 9/7; the areas on the right anterior and right medial lower leg have both closed over. He came in for a nurse visit today but I looked at the right leg and indeed it is healed. His edema control is moderate but certainly a lot better than when I first saw this. He is using his compression pumps twice a day over his juxta lite stockings 9/14; the area on the right anterior and right medial lower legs closed over last week and we discharged him into his own external compression stocking. This is in accompaniment of abdominal level external compression pumps that he uses for 45  minutes twice a day. He states that the control lasted till about Friday he started to have weeping edema and then by Saturday things look like they were starting to breakdown. He arrives in clinic today with a large erythematous area on the right lateral calf with weeping edema fluid also on the medial and anterior ankle. 9/21; this is a patient I actually discharged from clinic 2 weeks ago into his own stocking and external compression pumps. He arrived last week with uncontrolled edema and weeping fluid from the right leg we. We put him back in compression he has a juxta lite stocking I believe on the left leg which does not have any open wounds. He arrives in clinic today weeping open wounds on the right lateral calf. Apparently some purulent drainage when he came in. Most impressive for diffuse very tender erythema extending up to the level of his tibial tuberosity. Electronic Signature(s) Signed: 04/13/2020 4:19:57 PM By: Linton Ham MD Entered By: Linton Ham on 04/12/2020 13:06:54 -------------------------------------------------------------------------------- Physical Exam Details Patient Name: Date of Service: Nicholas [name] DFO RD K. 04/12/2020 11:15 A M Medical Record Number: [id number] Patient Account Number: [id number] Date of Birth/Sex: Treating RN: 1946/04/24 (74 y.o. Nicholas [name] Primary Care Provider: Garret [name] Other Clinician: Referring Provider: Treating Provider/Extender: Nicholas [name] in Treatment: 13 Constitutional Sitting or standing Blood Pressure is within target range for patient.. Pulse regular and within target range for patient.Marland Kitchen Respirations regular, non-labored and within target range.. Temperature is normal and within the target range for the patient.Marland Kitchen Appears in no distress. Respiratory work of breathing is normal. Notes Wound exam; he has better edema control than last week however substantial weeping angry area on  the right lateral lower leg/calf. More problematically than this diffuse erythema with marked tenderness circumferentially towards the upper lower leg. Electronic Signature(s) Signed: 04/13/2020 4:19:57 PM By: Linton Ham MD Entered By: Linton Ham on 04/12/2020 13:08:33 -------------------------------------------------------------------------------- Physician Orders Details Patient Name: Date of Service: Nicholas [name] DFO RD K. 04/12/2020 11:15 A M Medical Record Number: [id number] Patient Account Number: [id number] Date of Birth/Sex: Treating RN: [birth year] (74 y.o. Nicholas [name] Primary Care Provider: Garret [name] Other Clinician: Referring Provider: Treating Provider/Extender: Nicholas [name] in Treatment: 13 Verbal / Phone Orders: No Diagnosis Coding ICD-10 Coding Code Description I89.0 Lymphedema, not elsewhere classified L97.811 Non-pressure chronic ulcer of other part of right lower leg limited to breakdown of skin Follow-up Appointments Other: - Call to schedule appt after discharge from hospital or ER Dressing Change  Frequency Do not change entire dressing for one week. Skin Barriers/Peri-Wound Care TCA Cream or Ointment Wound Cleansing May shower with protection. - with cast protectors. Primary Wound Dressing Wound #20 Right,Circumferential Lower Leg Xeroform - just for today Secondary Dressing Wound #20 Right,Circumferential Lower Leg Kerlix/Rolled Gauze ABD pad Edema Control Avoid standing for long periods of time Elevate legs to the level of the heart or above for 30 minutes daily and/or when sitting, a frequency of: - throughout the day. Exercise regularly Support Garment 30-40 mm/Hg pressure to: - Farrow wrap to left leg daily Segmental Compressive Device. - use lymphedema pumps twice a day an hour each time. Once in the morning and once in the evening. Electronic Signature(s) Signed: 04/12/2020 5:09:36 PM By: Levan Hurst  RN, BSN Signed: 04/13/2020 4:19:57 PM By: Linton Ham MD Entered By: Levan Hurst on 04/12/2020 12:21:40 -------------------------------------------------------------------------------- Problem List Details Patient Name: Date of Service: Nicholas [name] DFO RD K. 04/12/2020 11:15 A M Medical Record Number: [id number] Patient Account Number: [id number] Date of Birth/Sex: Treating RN: 1946-06-20 (74 y.o. 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 04/12/2020 11:15 A M Medical Record Number: [id number] Patient Account Number: [id number] Date of Birth/Sex: Treating RN: [birth year] (74 y.o. Nicholas [name] Primary Care Provider: Garret [name] Other Clinician: Referring Provider: Treating Provider/Extender: Nicholas [name] in Treatment: 13 Subjective History of Present Illness (HPI) ADMISSION 07/04/2018 This is a 74 year old man who has bilateral lymphedema felt to be secondary to previous treatment for malignant melanoma on his left toes. He has  since had amputations of the second and third toe of the left foot. Malignant melanoma was treated with immunotherapy. He is not had radiation. Sometime after this he developed edema in the left leg that spread into the right leg. He has bilateral compression pumps at home and he uses them twice a day and claims to be quite compliant. He also has a juxta lite type stocking at home which is apparently 73-year-old. He states that he fell in early October dislocating and fracturing his left shoulder. He slept in a recliner with his legs dependent. He was seen and followed at the rehab clinic at [facility] and was having lymphedema wraps done by 1 of the PT who noted his open wounds last week and he has been referred here for management. The patient has 2 open wounds on the right s distal lower leg and one on the left anterior lower leg The patient is not a diabetic. 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 For one reason or another we could not get him set up with home health and he kept the wraps on all week which he generally seem to tolerate. He did not use his compression pumps over the top of the wraps which I told him he could do this week. He comes in with a new rash on both feet which looks like tinea 07/22/2018; much better edema control bilaterally. The wounds on the right lateral calf and left anterior lower leg both look a lot better. There is epithelialization over both surfaces but I think this is fragile and I am going to put him in our compression wraps for another week. He says he is been using his compression pumps at  home. ooHe has a constellation of lower extremity compression garments that he is going to bring in next week. In discussion with him I cannot really get a sense of everything he has. I think he was at one point prescribed 30-40 below-knee stockings but he points out he could not get them on. 07/29/2018; the patient arrives in his legs are healed. He has a constellation of stockings none of which I think are going to be that helpful except for the fact that he has external wraparound stockings of some description. He has above-knee stockings but I cannot imagine it would be possible to get these on READMISSION 01/08/2020 Patient is now a 74 year old man. We had him for a short period from December 2019 through January 2020. He has bilateral lower extremity lymphedema which he initially traces to treatment for malignant melanoma 7 years ago. He has been followed at the lymphedema clinic on [facility] and recently has compression pumps that go up to his mid abdomen. He recently developed superficial wounds bilaterally. They will not manage these in the lymphedema clinic where the services are run by therapy. He has not been using his compression pumps. His significant other cannot get the stockings on his legs and the patient is only limited ability to help because of Parkinson's disease Past medical history is essentially unchanged. He has sleep apnea, Parkinson's disease at some point labeled his Parkinson's plus, bilateral lymphedema, history of malignant melanoma. He does not have an arterial issue that I am aware of although his previous arterial studies have been noncompressible 7/1; patient we admitted the clinic 2 weeks ago. He has severe bilateral lymphedema. He had wounds predominantly on the left leg but also superficially on the right. He has compression pumps which he says he is using once a day. 7/15; 2-week follow-up. He comes in for nurse visit. He has severe bilateral lymphedema. He  has upper abdominal level compression pumps which she is using twice a day. We have been wrapping both his lower legs the areas on the right leg are healed. He still has an area of left upper lateral and right anterior medial. Anterior medial wound is small. He is tolerating this well. 7/22; still significant lymphedema even with 4-layer compression and external compression pump usage. He said he had some itching on the right anterior leg he comes in with a large superficial area of skin breakdown anteriorly and a smaller area laterally on the right leg. He also has a rash on the left dorsal ankle crease. Some of these look pustular. I wonder if this is an occlusion issue from foam we put down here. He also states the only thing different this week is the  did a 5-hour car ride with his legs dependent. There was nothing open on the right leg last week but we wrapped it in any case 7/27; culture of the blisters on his left anterior leg grew staph aureus. I gave him doxycycline which he started yesterday. He is already appear cleaner and dry. His major wound is on the right anterior lower leg and the right lateral lower leg. There is nothing else open on the left leg. He has better edema control today. 03/04/20-Patient comes in with new areas after he had a fall, on his left dorsal foot and right dorsal foot and left medial proximal leg, there is significant amount of drainage into his dressing that was removed today which is actually greenish in color. It looks like he is completed a course of doxycycline for staph that grew from his blisters however he might need antipseudomonal coverage at this point 8/16; apparently the wounds originally were all healed until his last visit when after a fall he had areas on the left dorsal foot, right dorsal foot and left medial proximal leg. T oday all of these have healed and he has a small area on the left medial ankle. The patient has severe bilateral lymphedema  which is managed usually with compression stockings/external compression stockings as well his mid abdomen compression pumps. He is still using the latter twice a day. 8/23; the wounds on his dorsal feet and medial ankle that he had from a fall last time of closed however he has a new wrap injury on the left medial upper leg and a smaller 1 anteriorly. He is using his compression pumps twice a day 8/30; nothing open on the left leg. He is in his stocking. Using his compression pumps twice a day. From last week still a small opening in the medial upper leg on the right he has a new area in the upper mid tibia. He thinks this might have been a wrap injury looks more like the remanent of the blister 9/7; the areas on the right anterior and right medial lower leg have both closed over. He came in for a nurse visit today but I looked at the right leg and indeed it is healed. His edema control is moderate but certainly a lot better than when I first saw this. He is using his compression pumps twice a day over his juxta lite stockings 9/14; the area on the right anterior and right medial lower legs closed over last week and we discharged him into his own external compression stocking. This is in accompaniment of abdominal level external compression pumps that he uses for 45 minutes twice a day. He states that the control lasted till about Friday he started to have weeping edema and then by Saturday things look like they were starting to breakdown. He arrives in clinic today with a large erythematous area on the right lateral calf with weeping edema fluid also on the medial and anterior ankle. 9/21; this is a patient I actually discharged from clinic 2 weeks ago into his own stocking and external compression pumps. He arrived last week with uncontrolled edema and weeping fluid from the right leg we. We put him back in compression he has a juxta lite stocking I believe on the left leg which does not have any  open wounds. ooHe arrives in clinic today weeping open wounds on the right lateral calf. Apparently some purulent drainage when he came in. Most impressive for diffuse very tender erythema extending  up to the level of his tibial tuberosity. Objective Constitutional Sitting or standing Blood Pressure is within target range for patient.. Pulse regular and within target range for patient.Marland Kitchen Respirations regular, non-labored and within target range.. Temperature is normal and within the target range for the patient.Marland Kitchen Appears in no distress. Vitals Time Taken: 11:28 AM, Height: 67 in, Weight: 250 lbs, BMI: 39.2, Temperature: 97.9 F, Pulse: 92 bpm, Respiratory Rate: 18 breaths/min, Blood Pressure: 130/79 mmHg. Respiratory work of breathing is normal. General Notes: Wound exam; he has better edema control than last week however substantial weeping angry area on the right lateral lower leg/calf. More problematically than this diffuse erythema with marked tenderness circumferentially towards the upper lower leg. Integumentary (Hair, Skin) Wound #17 status is Converted. Original cause of wound was Gradually Appeared. The wound is located on the Right,Lateral Lower Leg. The wound measures 5.5cm length x 5.5cm width x 0.1cm depth; 23.758cm^2 area and 2.376cm^3 volume. There is Fat Layer (Subcutaneous Tissue) exposed. There is no tunneling or undermining noted. There is a large amount of purulent drainage noted. The wound margin is flat and intact. There is medium (34-66%) red granulation within the wound bed. There is a medium (34-66%) amount of necrotic tissue within the wound bed including Adherent Slough. Wound #18 status is Converted. Original cause of wound was Gradually Appeared. The wound is located on the Right,Lateral Ankle. The wound measures 0cm length x 0cm width x 0cm depth; 0cm^2 area and 0cm^3 volume. There is Fat Layer (Subcutaneous Tissue) exposed. There is no tunneling or  undermining noted. There is a large amount of purulent drainage noted. The wound margin is indistinct and nonvisible. There is small (1-33%) pink granulation within the wound bed. There is a small (1-33%) amount of necrotic tissue within the wound bed including Adherent Slough. Wound #19 status is Converted. Original cause of wound was Blister. The wound is located on the Right,Medial,Dorsal Foot. The wound measures 0cm length x 0cm width x 0cm depth; 0cm^2 area and 0cm^3 volume. There is Fat Layer (Subcutaneous Tissue) exposed. There is no tunneling or undermining noted. There is a large amount of purulent drainage noted. The wound margin is flat and intact. There is large (67-100%) pink granulation within the wound bed. There is no necrotic tissue within the wound bed. Wound #20 status is Open. Original cause of wound was Gradually Appeared. The wound is located on the Right,Circumferential Lower Leg. The wound measures 15cm length x 44cm width x 0.1cm depth; 518.363cm^2 area and 51.836cm^3 volume. There is Fat Layer (Subcutaneous Tissue) exposed. There is no tunneling or undermining noted. There is a large amount of purulent drainage noted. Foul odor after cleansing was noted. The wound margin is distinct with the outline attached to the wound base. There is large (67-100%) red, pink granulation within the wound bed. There is a small (1-33%) amount of necrotic tissue within the wound bed including Adherent Slough. Assessment Active Problems ICD-10 Lymphedema, not elsewhere classified Non-pressure chronic ulcer of other part of right lower leg limited to breakdown of skin Cellulitis of right lower limb Plan Follow-up Appointments: Other: - Call to schedule appt after discharge from hospital or ER Dressing Change Frequency: Do not change entire dressing for one week. Skin Barriers/Peri-Wound Care: TCA Cream or Ointment Wound Cleansing: May shower with protection. - with cast  protectors. Primary Wound Dressing: Wound #20 Right,Circumferential Lower Leg: Xeroform - just for today Secondary Dressing: Wound #20 Right,Circumferential Lower Leg: Kerlix/Rolled Gauze ABD pad Edema Control: Avoid  standing for long periods of time Elevate legs to the level of the heart or above for 30 minutes daily and/or when sitting, a frequency of: - throughout the day. Exercise regularly Support Garment 30-40 mm/Hg pressure to: - Farrow wrap to left leg daily Segmental Compressive Device. - use lymphedema pumps twice a day an hour each time. Once in the morning and once in the evening. 1. I thought the patient had lower extremity cellulitis in the setting of lymphedema in the right leg. I felt this was too risky for outpatient management I have referred him to [facility], ER. He initially objected stating that he is moving into the independent part of the [place] complex however I told him that he needed a probably 2 or 3 days of IV antibiotics to get this under control. 2. I doubt this is a DVT far to tender Electronic Signature(s) Signed: 04/13/2020 4:19:57 PM By: Linton Ham MD Entered By: Linton Ham on 04/12/2020 13:09:45 -------------------------------------------------------------------------------- SuperBill Details Patient Name: Date of Service: Nicholas [name] DFO RD K. 04/12/2020 Medical Record Number: [id number] Patient Account Number: [id number] Date of Birth/Sex: Treating RN: [birth year] (74 y.o. Nicholas [name] Primary Care Provider: Garret [name] Other Clinician: Referring Provider: Treating Provider/Extender: Nicholas [name] in Treatment: 13 Diagnosis Coding ICD-10 Codes Code Description I89.0 Lymphedema, not elsewhere classified L97.811 Non-pressure chronic ulcer of other part of right lower leg limited to breakdown of skin L03.115 Cellulitis of right lower limb Facility Procedures CPT4 Code: [id number] Description: 26378 -  WOUND CARE VISIT-LEV 3 EST PT Modifier: Quantity: 1 Physician Procedures : CPT4 Code Description Modifier [id number] - WC PHYS LEVEL 4 - EST PT ICD-10 Diagnosis Description L97.811 Non-pressure chronic ulcer of other part of right lower leg limited to breakdown of skin I89.0 Lymphedema, not elsewhere classified L03.115  Cellulitis of right lower limb Quantity: 1 Electronic Signature(s) Signed: 04/12/2020 5:09:36 PM By: Levan Hurst RN, BSN Signed: 04/13/2020 4:19:57 PM By: Linton Ham MD Entered By: Levan Hurst on 04/12/2020 14:26:45

## 2020-04-14 NOTE — Progress Notes (Signed)
PROGRESS NOTE    Nicholas [name]  [id number]:[id number] DOB: 11-29-1945 DOA: 04/12/2020 PCP: [name], MD    Chief Complaint  Patient presents with   Leg Pain   Leg Swelling   Cellulitis    Brief Narrative:  74 year old gentleman prior history of Parkinson's disease, chronic lymphedema and chronic leg wounds followed by wound clinic presents with worsening right lower extremity redness and swelling for the past 3 weeks.  Patient failed outpatient oral antibiotics and was sent to ED by wound care physician. Marland Kitchen  He was started on broad-spectrum IV antibiotics and his symptoms have improved.  Assessment & Plan:   Principal Problem:   Cellulitis Active Problems:   History of malignant melanoma. left leg   Hyperlipidemia   Essential hypertension   Parkinson's plus syndrome (HCC)   Right lower extremity nonpurulent cellulitis secondary to bilateral lower extremity lymphedema Failed outpatient treatment with oral antibiotics Started on broad-spectrum IV antibiotics. Wound care consulted and appreciate recommendations. Appreciate WOCN input -Cleanse with NS and pat dry. Apply Aquacel Ag to open wounds on RLE. Cover with ABD pad & kerlix.  -Change Tuesday and Friday Continue to elevate right lower extremity.    History of Parkinson's disease Continue with Sinemet    Essential hypertension Blood pressure parameters are well controlled.    Bilateral lower extremity lymphedema Compression stockings on the left lower extremity.       DVT prophylaxis: lovenox.  Code Status: DNR  Family Communication: none atbedside.  Disposition:   Status is: Inpatient  Remains inpatient appropriate because:IV treatments appropriate due to intensity of illness or inability to take PO   Dispo: The patient is from: Home              Anticipated d/c is to: Home              Anticipated d/c date is: > 3 days              Patient currently is not medically stable to  d/c.       Consultants:   None.   Procedures: none.   Antimicrobials:  Antibiotics Given (last 72 hours)    Date/Time Action Medication Dose Rate   04/12/20 1449 New Bag/Given   vancomycin (VANCOREADY) IVPB 2000 mg/400 mL 2,000 mg 200 mL/hr   04/12/20 1900 New Bag/Given   ceFEPIme (MAXIPIME) 2 g in sodium chloride 0.9 % 100 mL IVPB 2 g 200 mL/hr   04/13/20 0235 New Bag/Given  [Pump would not register]   ceFEPIme (MAXIPIME) 2 g in sodium chloride 0.9 % 100 mL IVPB 2 g 200 mL/hr   04/13/20 1136 New Bag/Given   ceFEPIme (MAXIPIME) 2 g in sodium chloride 0.9 % 100 mL IVPB 2 g 200 mL/hr   04/13/20 1705 New Bag/Given   ceFEPIme (MAXIPIME) 2 g in sodium chloride 0.9 % 100 mL IVPB 2 g 200 mL/hr   04/14/20 0159 New Bag/Given   ceFEPIme (MAXIPIME) 2 g in sodium chloride 0.9 % 100 mL IVPB 2 g 200 mL/hr   04/14/20 1012 New Bag/Given   ceFEPIme (MAXIPIME) 2 g in sodium chloride 0.9 % 100 mL IVPB 2 g 200 mL/hr          Subjective: No complaints, reports his leg is improving.   Objective: Vitals:   04/13/20 1351 04/13/20 2050 04/14/20 0524 04/14/20 1354  BP: 125/74 (!) 144/70 (!) 145/77 (!) 149/73  Pulse: 83 92 91 87  Resp: 18 18 16 18   Temp:  98.9 F (37.2 C) 98.2 F (36.8 C) 98.7 F (37.1 C) 98.3 F (36.8 C)  TempSrc: Oral   Oral  SpO2: 99% 96% 96% 96%  Weight:      Height:        Intake/Output Summary (Last 24 hours) at 04/14/2020 1736 Last data filed at 04/14/2020 1418 Gross per 24 hour  Intake 1920.5 ml  Output 3800 ml  Net -1879.5 ml   Filed Weights   04/12/20 1249  Weight: 113.4 kg    Examination:  General exam: Appears calm and comfortable  Respiratory system: Clear to auscultation. Respiratory effort normal. Cardiovascular system: S1 & S2 heard, RRR. No JVD, . No pedal edema. Gastrointestinal system: Abdomen is nondistended, soft and nontender. Normal bowel sounds heard. Central nervous system: Alert and oriented. No focal neurological  deficits. Extremities: bialteral lower extremity edema >3, right lower extremity erythema and tenderness.  Skin: No rashes, lesions or ulcers Psychiatry: Mood & affect appropriate.     Data Reviewed: I have personally reviewed following labs and imaging studies  CBC: Recent Labs  Lab 04/12/20 1331 04/13/20 0336  WBC 10.6* 7.6  NEUTROABS 7.6  --   HGB 15.1 12.9*  HCT 47.5 40.8  MCV 96.7 96.9  PLT 190 952    Basic Metabolic Panel: Recent Labs  Lab 04/12/20 1331 04/13/20 0336  NA 144 139  K 4.2 4.0  CL 103 106  CO2 28 25  GLUCOSE 96 97  BUN 26* 23  CREATININE 0.83 0.72  CALCIUM 9.3 8.5*    GFR: Estimated Creatinine Clearance: 97.4 mL/min (by C-G formula based on SCr of 0.72 mg/dL).  Liver Function Tests: Recent Labs  Lab 04/12/20 1331  AST 13*  ALT <5  ALKPHOS 63  BILITOT 0.5  PROT 7.4  ALBUMIN 3.8    CBG: No results for input(s): GLUCAP in the last 168 hours.   Recent Results (from the past 240 hour(s))  SARS Coronavirus 2 by RT PCR (hospital order, performed in [facility] hospital lab) Nasopharyngeal Nasopharyngeal Swab     Status: None   Collection Time: 04/12/20  3:00 PM   Specimen: Nasopharyngeal Swab  Result Value Ref Range Status   SARS Coronavirus 2 NEGATIVE NEGATIVE Final    Comment: (NOTE) SARS-CoV-2 target nucleic acids are NOT DETECTED.  The SARS-CoV-2 RNA is generally detectable in upper and lower respiratory specimens during the acute phase of infection. The lowest concentration of SARS-CoV-2 viral copies this assay can detect is 250 copies / mL. A negative result does not preclude SARS-CoV-2 infection and should not be used as the sole basis for treatment or other patient management decisions.  A negative result may occur with improper specimen collection / handling, submission of specimen other than nasopharyngeal swab, presence of viral mutation(s) within the areas targeted by this assay, and inadequate number of viral  copies (<250 copies / mL). A negative result must be combined with clinical observations, patient history, and epidemiological information.  Fact Sheet for Patients:   StrictlyIdeas.no  Fact Sheet for Healthcare Providers: BankingDealers.co.za  This test is not yet approved or  cleared by the [place] FDA and has been authorized for detection and/or diagnosis of SARS-CoV-2 by FDA under an Emergency Use Authorization (EUA).  This EUA will remain in effect (meaning this test can be used) for the duration of the COVID-19 declaration under Section 564(b)(1) of the Act, 21 U.S.C. section 360bbb-3(b)(1), unless the authorization is terminated or revoked sooner.  Performed at [facility], [place] [address] [place], [place] [address]          Radiology Studies: No results found.      Scheduled Meds:  carbidopa-levodopa  2 tablet Oral 4 times per day   enoxaparin (LOVENOX) injection  55 mg Subcutaneous Q24H   rosuvastatin  10 mg Oral Daily   Continuous Infusions:  sodium chloride 250 mL (04/12/20 1733)   ceFEPime (MAXIPIME) IV 2 g (04/14/20 1012)     LOS: 2 days        Hosie Poisson, MD [facility]   To contact the attending provider between 7A-7P or the covering provider during after hours 7P-7A, please log into the web site [email or web address] and access using universal [place] password for that web site. If you do not have the password, please call the hospital operator.  04/14/2020, 5:36 PM

## 2020-04-14 NOTE — Plan of Care (Signed)

## 2020-04-15 ENCOUNTER — Inpatient Hospital Stay (HOSPITAL_COMMUNITY): Payer: PPO

## 2020-04-15 MED ORDER — CIPROFLOXACIN HCL 500 MG PO TABS: 500.0000 mg | ORAL_TABLET | Freq: Two times a day (BID) | ORAL | 0 refills | Status: AC

## 2020-04-15 MED ORDER — CEPHALEXIN 500 MG PO CAPS: 500.0000 mg | ORAL_CAPSULE | Freq: Two times a day (BID) | ORAL | 0 refills | Status: AC

## 2020-04-15 NOTE — Progress Notes (Signed)
Patient dc'd, verbalized understanding of instructions, wife expressed several times that she is comfortable and capable of taking care of patient and assisting to transfer him into the house. Took patient off unit via wheelchair and assisted into car.Patient states he is comfortable with leaving.  Deanna Artis, rn

## 2020-04-15 NOTE — Care Management Important Message (Signed)
Important Message  Patient Details IM Letter given to the Patient Name: Nicholas [name] MRN: [id number] Date of Birth: 10-30-1945   Medicare Important Message Given:  Yes     Kerin Salen 04/15/2020, 10:51 AM

## 2020-04-15 NOTE — Discharge Summary (Signed)
Physician Discharge Summary  Nicholas [name] QQP:[id number] DOB: Sep 19, 1945 DOA: 04/12/2020  PCP: [name], MD  Admit date: 04/12/2020 Discharge date: 04/15/2020  Admitted From: Home.  Disposition:  Home.   Recommendations for Outpatient Follow-up:  1. Follow up with PCP in 1-2 weeks 2. Please obtain BMP/CBC in one week Please follow up with wound care on the 27 th September.    Discharge Condition:Stable.  CODE STATUS:full code.  Diet recommendation: Heart Healthy  Brief/Interim Summary: 74 year old gentleman prior history of Parkinson's disease, chronic lymphedema and chronic leg wounds followed by wound clinic presents with worsening right lower extremity redness and swelling for the past 3 weeks.  Patient failed outpatient oral antibiotics and was sent to ED by wound care physician. Marland Kitchen  He was started on broad-spectrum IV antibiotics and his symptoms have improved.  Discharge Diagnoses:  Principal Problem:   Cellulitis Active Problems:   History of malignant melanoma. left leg   Hyperlipidemia   Essential hypertension   Parkinson's plus syndrome (HCC)  Right lower extremity nonpurulent cellulitis secondary to bilateral lower extremity lymphedema Failed outpatient treatment with oral antibiotics Started on broad-spectrum IV antibiotics, his right leg erythema and tenderness has much improved.  transitioned to keflex and ciprofloxacin for another 10 days to complete the course.  Wound care consulted and appreciate recommendations. Appreciate WOCN input -Cleanse with NS and pat dry. Apply Aquacel Ag to open wounds on RLE. Cover with ABD pad&kerlix.  -Change Tuesday and Friday Continue to elevate right lower extremity.    History of Parkinson's disease Continue with Sinemet    Essential hypertension Blood pressure parameters are well controlled.    Bilateral lower extremity lymphedema Compression stockings on the left lower  extremity.     Discharge Instructions  Discharge Instructions    Diet - low sodium heart healthy   Complete by: As directed    Discharge instructions   Complete by: As directed    Please follow up with wound care clinic as recommended.   Discharge wound care:   Complete by: As directed    Wound care  Daily      Comments: Cleanse left leg, applied stockingette and Juxtalite for compression. Remove and assess daily and re-apply.  Right lower leg:  Cleanse with NS and pat dry. Apply Aquacel Ag t(LAWSON # [id number]) to open wounds on RLE. Cover with ABD pad and kerlix. Change Tuesday and Friday. Bedside RN to perform.  04/13/20 1009     Allergies as of 04/15/2020      Reactions   Amoxicillin Swelling   Presumed angioedema of lips due to prednisone      Medication List    TAKE these medications   carbidopa-levodopa 25-100 MG tablet Commonly known as: SINEMET IR TAKE 2 TABLETS AT 7AM,11AM, 3PM, AND AT 7PM. What changed: See the new instructions.   cephALEXin 500 MG capsule Commonly known as: KEFLEX Take 1 capsule (500 mg total) by mouth 2 (two) times daily for 10 days.   ciprofloxacin 500 MG tablet Commonly known as: Cipro Take 1 tablet (500 mg total) by mouth 2 (two) times daily for 10 days.   Multivitamin Adults 50+ Tabs Take 1 tablet by mouth in the morning and at bedtime.   rosuvastatin 10 MG tablet Commonly known as: CRESTOR TAKE 1 TABLET ONCE DAILY.            Discharge Care Instructions  (From admission, onward)         Start  Ordered   04/15/20 0000  Discharge wound care:       Comments: Wound care  Daily      Comments: Cleanse left leg, applied stockingette and Juxtalite for compression. Remove and assess daily and re-apply.  Right lower leg:  Cleanse with NS and pat dry. Apply Aquacel Ag t(LAWSON # [id number]) to open wounds on RLE. Cover with ABD pad and kerlix. Change Tuesday and Friday. Bedside RN to perform.  04/13/20 1009   04/15/20 1231           Allergies  Allergen Reactions  . Amoxicillin Swelling    Presumed angioedema of lips due to prednisone    Consultations:  None.    Procedures/Studies: DG Chest 2 View  Result Date: 04/12/2020 CLINICAL DATA:  Bilateral lower extremity swelling. EXAM: CHEST - 2 VIEW COMPARISON:  None. FINDINGS: The heart size and mediastinal contours are within normal limits. Both lungs are clear. The visualized skeletal structures are unremarkable. IMPRESSION: No active cardiopulmonary disease. Electronically Signed   By: Marijo Conception M.D.   On: 04/12/2020 14:14   DG Tibia/Fibula Right  Result Date: 04/15/2020 CLINICAL DATA:  Cellulitis of right lower leg. EXAM: RIGHT TIBIA AND FIBULA - 2 VIEW COMPARISON:  None. FINDINGS: There is no evidence of fracture or other focal bone lesions. Soft tissues are unremarkable. IMPRESSION: Negative. Electronically Signed   By: Marijo Conception M.D.   On: 04/15/2020 12:12       Subjective: No new complaints, wants to go home.   Discharge Exam: Vitals:   04/15/20 0538 04/15/20 1228  BP: 134/75 (!) 150/84  Pulse: 81 82  Resp: 17 16  Temp: 97.7 F (36.5 C) 98.1 F (36.7 C)  SpO2: 96% 96%   Vitals:   04/14/20 1354 04/14/20 2135 04/15/20 0538 04/15/20 1228  BP: (!) 149/73 (!) 151/86 134/75 (!) 150/84  Pulse: 87 93 81 82  Resp: 18 17 17 16   Temp: 98.3 F (36.8 C) 98.2 F (36.8 C) 97.7 F (36.5 C) 98.1 F (36.7 C)  TempSrc: Oral Oral Oral Oral  SpO2: 96% 95% 96% 96%  Weight:      Height:        General: Pt is alert, awake, not in acute distress Cardiovascular: RRR, S1/S2 +, no rubs, no gallops Respiratory: CTA bilaterally, no wheezing, no rhonchi Abdominal: Soft, NT, ND, bowel sounds + Extremities:  Bilateral leg edema, improving with right leg erythema and tenderness.     The results of significant diagnostics from this hospitalization (including imaging, microbiology, ancillary and laboratory) are listed below for reference.      Microbiology: Recent Results (from the past 240 hour(s))  SARS Coronavirus 2 by RT PCR (hospital order, performed in [facility] hospital lab) Nasopharyngeal Nasopharyngeal Swab     Status: None   Collection Time: 04/12/20  3:00 PM   Specimen: Nasopharyngeal Swab  Result Value Ref Range Status   SARS Coronavirus 2 NEGATIVE NEGATIVE Final    Comment: (NOTE) SARS-CoV-2 target nucleic acids are NOT DETECTED.  The SARS-CoV-2 RNA is generally detectable in upper and lower respiratory specimens during the acute phase of infection. The lowest concentration of SARS-CoV-2 viral copies this assay can detect is 250 copies / mL. A negative result does not preclude SARS-CoV-2 infection and should not be used as the sole basis for treatment or other patient management decisions.  A negative result may occur with improper specimen collection / handling, submission of specimen other than nasopharyngeal swab, presence of  viral mutation(s) within the areas targeted by this assay, and inadequate number of viral copies (<250 copies / mL). A negative result must be combined with clinical observations, patient history, and epidemiological information.  Fact Sheet for Patients:   StrictlyIdeas.no  Fact Sheet for Healthcare Providers: BankingDealers.co.za  This test is not yet approved or  cleared by the [place] FDA and has been authorized for detection and/or diagnosis of SARS-CoV-2 by FDA under an Emergency Use Authorization (EUA).  This EUA will remain in effect (meaning this test can be used) for the duration of the COVID-19 declaration under Section 564(b)(1) of the Act, 21 U.S.C. section 360bbb-3(b)(1), unless the authorization is terminated or revoked sooner.  Performed at [facility], [place] [address], [place], [place] [address]      Labs: BNP (last 3 results) No results for input(s): BNP in the last 8760  hours. Basic Metabolic Panel: Recent Labs  Lab 04/12/20 1331 04/13/20 0336  NA 144 139  K 4.2 4.0  CL 103 106  CO2 28 25  GLUCOSE 96 97  BUN 26* 23  CREATININE 0.83 0.72  CALCIUM 9.3 8.5*   Liver Function Tests: Recent Labs  Lab 04/12/20 1331  AST 13*  ALT <5  ALKPHOS 63  BILITOT 0.5  PROT 7.4  ALBUMIN 3.8   No results for input(s): LIPASE, AMYLASE in the last 168 hours. No results for input(s): AMMONIA in the last 168 hours. CBC: Recent Labs  Lab 04/12/20 1331 04/13/20 0336  WBC 10.6* 7.6  NEUTROABS 7.6  --   HGB 15.1 12.9*  HCT 47.5 40.8  MCV 96.7 96.9  PLT 190 163   Cardiac Enzymes: No results for input(s): CKTOTAL, CKMB, CKMBINDEX, TROPONINI in the last 168 hours. BNP: Invalid input(s): POCBNP CBG: No results for input(s): GLUCAP in the last 168 hours. D-Dimer No results for input(s): DDIMER in the last 72 hours. Hgb A1c No results for input(s): HGBA1C in the last 72 hours. Lipid Profile No results for input(s): CHOL, HDL, LDLCALC, TRIG, CHOLHDL, LDLDIRECT in the last 72 hours. Thyroid function studies No results for input(s): TSH, T4TOTAL, T3FREE, THYROIDAB in the last 72 hours.  Invalid input(s): FREET3 Anemia work up No results for input(s): VITAMINB12, FOLATE, FERRITIN, TIBC, IRON, RETICCTPCT in the last 72 hours. Urinalysis    Component Value Date/Time   COLORURINE YELLOW 04/12/2020 1536   APPEARANCEUR CLEAR 04/12/2020 1536   LABSPEC 1.035 (H) 04/12/2020 1536   PHURINE 5.0 04/12/2020 1536   GLUCOSEU NEGATIVE 04/12/2020 1536   HGBUR NEGATIVE 04/12/2020 1536   BILIRUBINUR NEGATIVE 04/12/2020 1536   BILIRUBINUR n 02/09/2017 1214   KETONESUR 5 (A) 04/12/2020 1536   PROTEINUR NEGATIVE 04/12/2020 1536   UROBILINOGEN 0.2 02/09/2017 1214   NITRITE NEGATIVE 04/12/2020 1536   LEUKOCYTESUR NEGATIVE 04/12/2020 1536   Sepsis Labs Invalid input(s): PROCALCITONIN,  WBC,  LACTICIDVEN Microbiology Recent Results (from the past 240 hour(s))  SARS  Coronavirus 2 by RT PCR (hospital order, performed in [place] hospital lab) Nasopharyngeal Nasopharyngeal Swab     Status: None   Collection Time: 04/12/20  3:00 PM   Specimen: Nasopharyngeal Swab  Result Value Ref Range Status   SARS Coronavirus 2 NEGATIVE NEGATIVE Final    Comment: (NOTE) SARS-CoV-2 target nucleic acids are NOT DETECTED.  The SARS-CoV-2 RNA is generally detectable in upper and lower respiratory specimens during the acute phase of infection. The lowest concentration of SARS-CoV-2 viral copies this assay can detect is 250 copies / mL. A  negative result does not preclude SARS-CoV-2 infection and should not be used as the sole basis for treatment or other patient management decisions.  A negative result may occur with improper specimen collection / handling, submission of specimen other than nasopharyngeal swab, presence of viral mutation(s) within the areas targeted by this assay, and inadequate number of viral copies (<250 copies / mL). A negative result must be combined with clinical observations, patient history, and epidemiological information.  Fact Sheet for Patients:   StrictlyIdeas.no  Fact Sheet for Healthcare Providers: BankingDealers.co.za  This test is not yet approved or  cleared by the [place] FDA and has been authorized for detection and/or diagnosis of SARS-CoV-2 by FDA under an Emergency Use Authorization (EUA).  This EUA will remain in effect (meaning this test can be used) for the duration of the COVID-19 declaration under Section 564(b)(1) of the Act, 21 U.S.C. section 360bbb-3(b)(1), unless the authorization is terminated or revoked sooner.  Performed at [facility], [place] [address] [place], [place] [address]      Time coordinating discharge: 34 minutes.   SIGNED:   Hosie Poisson, MD  [facility] 04/15/2020, 8:17 PM

## 2020-04-15 NOTE — TOC Initial Note (Signed)
Transition of Care [facility]) - Initial/Assessment Note    Patient Details  Name: Nicholas [name] MRN: [id number] Date of Birth: [birth year]  Transition of Care [facility]) CM/SW Contact:    Lia Hopping, [place] Phone Number: 04/15/2020, 10:03 AM  Clinical Narrative:                 CSW met with the patient at bedside. CSW assessed patient for home needs. Patient reports he is active with [facility] for wound dressing changes. Patient is scheduled to see Dr. Dellia Nims on Mondays.  [facility] and spouse confirm the next scheduled appointment is Monday, Sept. 27th. Spouse reports the patient has a caregiver that assist the patient with bathing, dressing and "putting on compression socks" on Monday, Wednesday and Friday. Patient is ambulatory with a RW and WC.  Spouse will transport the patient home.    Expected Discharge Plan: Home/Self Care Barriers to Discharge: Barriers Resolved   Patient Goals and CMS Choice   CMS Medicare.gov Compare Post Acute Care list provided to:: Patient Choice offered to / list presented to : Patient  Expected Discharge Plan and Services Expected Discharge Plan: Home/Self Care In-house Referral: Clinical Social Work     Living arrangements for the past 2 months: Single Family Home                 DME Arranged: N/A                    Prior Living Arrangements/Services Living arrangements for the past 2 months: Single Family Home Lives with:: Spouse Patient language and need for interpreter reviewed:: No Do you feel safe going back to the place where you live?: Yes      Need for Family Participation in Patient Care: Yes (Comment) Care giver support system in place?: Yes (comment) Current home services: DME Criminal Activity/Legal Involvement Pertinent to Current Situation/Hospitalization: No - Comment as needed  Activities of Daily Living Home Assistive Devices/Equipment: Eyeglasses, Wheelchair, CPAP ADL Screening (condition at time of  admission) Patient's cognitive ability adequate to safely complete daily activities?: Yes Is the patient deaf or have difficulty hearing?: No Does the patient have difficulty seeing, even when wearing glasses/contacts?: No Does the patient have difficulty concentrating, remembering, or making decisions?: No Patient able to express need for assistance with ADLs?: Yes Does the patient have difficulty dressing or bathing?: No Independently performs ADLs?: No Communication: Independent Dressing (OT): Independent Grooming: Independent Feeding: Independent Bathing: Independent Toileting: Needs assistance Is this a change from baseline?: Pre-admission baseline In/Out Bed: Needs assistance Is this a change from baseline?: Pre-admission baseline Walks in Home: Independent with device (comment) Is this a change from baseline?: Pre-admission baseline Does the patient have difficulty walking or climbing stairs?: Yes Weakness of Legs: Both Weakness of Arms/Hands: None  Permission Sought/Granted Permission sought to share information with : Family Supports Permission granted to share information with : Yes, Verbal Permission Granted  Share Information with NAME: Saladin,[name]  Permission granted to share info w AGENCY: [facility].  Permission granted to share info w Relationship: Spouse  Permission granted to share info w Contact Information: [phone], [phone]  Emotional Assessment Appearance:: Appears stated age Attitude/Demeanor/Rapport: Engaged Affect (typically observed): Accepting Orientation: : Oriented to Self, Oriented to Place, Oriented to  Time, Oriented to Situation Alcohol / Substance Use: Not Applicable Psych Involvement: No (comment)  Admission diagnosis:  Cellulitis [L03.90] Cellulitis of right lower extremity [L03.115] Patient Active Problem List  Diagnosis Date Noted  . Cellulitis 04/12/2020  . Strain of right biceps 09/12/2017  .  Lymphedema 02/10/2017  . BPH associated with nocturia 05/16/2016  . Senile purpura ([place]) 05/03/2016  . OSA (obstructive sleep apnea) 12/28/2015  . Hypersomnia 11/15/2015  . Cough 11/15/2015  . 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 Dizziness 12/30/2014  . Diastolic dysfunction 54/00/8676  . Former smoker 04/29/2014  . Chronic venous insufficiency 02/20/2014  . Hyperglycemia 08/02/2012  . Morbid obesity ([place]) 07/13/2010  . History of malignant melanoma. left leg 03/09/2009  . Insomnia 10/03/2007  . Hyperlipidemia 12/13/2006  . Essential hypertension 12/13/2006   PCP:  [name], MD Pharmacy:   [facility] [address] [place] [address] Phone: [phone] Fax: [phone]     Social Determinants of Health (SDOH) Interventions    Readmission Risk Interventions No flowsheet data found.

## 2020-04-15 NOTE — Plan of Care (Signed)
Patient dc'd all careplans completed. Deanna Artis, RN

## 2020-04-19 ENCOUNTER — Encounter (HOSPITAL_BASED_OUTPATIENT_CLINIC_OR_DEPARTMENT_OTHER): Payer: PPO | Admitting: Internal Medicine

## 2020-04-19 DIAGNOSIS — N400 Enlarged prostate without lower urinary tract symptoms: Secondary | ICD-10-CM | POA: Diagnosis not present

## 2020-04-19 DIAGNOSIS — I5032 Chronic diastolic (congestive) heart failure: Secondary | ICD-10-CM | POA: Diagnosis not present

## 2020-04-19 DIAGNOSIS — L97812 Non-pressure chronic ulcer of other part of right lower leg with fat layer exposed: Secondary | ICD-10-CM | POA: Diagnosis not present

## 2020-04-19 DIAGNOSIS — G20 Parkinson's disease: Secondary | ICD-10-CM | POA: Diagnosis not present

## 2020-04-19 DIAGNOSIS — L03115 Cellulitis of right lower limb: Secondary | ICD-10-CM | POA: Diagnosis not present

## 2020-04-19 DIAGNOSIS — I110 Hypertensive heart disease with heart failure: Secondary | ICD-10-CM | POA: Diagnosis not present

## 2020-04-19 DIAGNOSIS — G4733 Obstructive sleep apnea (adult) (pediatric): Secondary | ICD-10-CM | POA: Diagnosis not present

## 2020-04-19 DIAGNOSIS — I890 Lymphedema, not elsewhere classified: Secondary | ICD-10-CM | POA: Diagnosis not present

## 2020-04-19 DIAGNOSIS — Z85820 Personal history of malignant melanoma of skin: Secondary | ICD-10-CM | POA: Diagnosis not present

## 2020-04-19 DIAGNOSIS — E785 Hyperlipidemia, unspecified: Secondary | ICD-10-CM | POA: Diagnosis not present

## 2020-04-19 DIAGNOSIS — L97811 Non-pressure chronic ulcer of other part of right lower leg limited to breakdown of skin: Secondary | ICD-10-CM | POA: Diagnosis not present

## 2020-04-20 NOTE — Progress Notes (Signed)
[name] ([id number]) , Visit Report for 04/19/2020 Arrival Information Details Patient Name: Date of Service: Nicholas [name] DFO RD K. 04/19/2020 1:00 PM Medical Record Number: [id number] Patient Account Number: [id number] Date of Birth/Sex: Treating RN: October 27, 1945 (74 y.o. [name] Primary Care Mellisa Arshad: [name] Other Clinician: Referring Phoenicia Pirie: Treating Kimbella Heisler/Extender: [name] in Treatment: 14 Visit Information History Since Last Visit Added or deleted any medications: No Patient Arrived: Wheel Chair Any new allergies or adverse reactions: No Arrival Time: 13:11 Had a fall or experienced change in No Accompanied By: wife activities of daily living that may affect Transfer Assistance: None risk of falls: Patient Identification Verified: Yes Signs or symptoms of abuse/neglect since last visito No Secondary Verification Process Completed: Yes Hospitalized since last visit: No Patient Requires Transmission-Based Precautions: No Implantable device outside of the clinic excluding No Patient Has Alerts: Yes cellular tissue based products placed in the center Patient Alerts: ABI: [place] 07/2018 since last visit: Pain Present Now: No Electronic Signature(s) Signed: 04/19/2020 5:24:24 PM By: Kela Millin Entered By: Kela Millin on 04/19/2020 13:12:10 -------------------------------------------------------------------------------- Compression Therapy Details Patient Name: Date of Service: Nicholas [name] DFO RD K. 04/19/2020 1:00 PM Medical Record Number: [id number] Patient Account Number: [id number] Date of Birth/Sex: Treating RN: March 19, 1946 (74 y.o. [name] Primary Care Toran Murch: [name] Other Clinician: Referring Ahsley Attwood: Treating Makisha Marrin/Extender: [name] in Treatment: 14 Compression Therapy Performed for Wound Assessment: Wound #20 Right,Circumferential Lower  Leg Performed By: Clinician Levan Hurst, RN Compression Type: Four Layer Post Procedure Diagnosis Same as Pre-procedure Electronic Signature(s) Signed: 04/19/2020 5:31:38 PM By: Levan Hurst RN, BSN Entered By: Levan Hurst on 04/19/2020 13:43:33 -------------------------------------------------------------------------------- Encounter Discharge Information Details Patient Name: Date of Service: Nicholas [name] DFO RD K. 04/19/2020 1:00 PM Medical Record Number: [id number] Patient Account Number: [id number] Date of Birth/Sex: Treating RN: 1946/07/04 (74 y.o. Nicholas [name] Primary Care Tomaz Janis: [name] Other Clinician: Referring Lorn Butcher: Treating Tanishia Lemaster/Extender: [name] in Treatment: 14 Encounter Discharge Information Items Discharge Condition: Stable Ambulatory Status: Wheelchair Discharge Destination: Home Transportation: Private Auto Accompanied By: self Schedule Follow-up Appointment: Yes Clinical Summary of Care: Electronic Signature(s) Signed: 04/19/2020 5:26:25 PM By: Deon Pilling Entered By: Deon Pilling on 04/19/2020 14:12:40 -------------------------------------------------------------------------------- Lower Extremity Assessment Details Patient Name: Date of Service: Nicholas [name] DFO RD K. 04/19/2020 1:00 PM Medical Record Number: [id number] Patient Account Number: [id number] Date of Birth/Sex: Treating RN: [birth year] (74 y.o. [name] Primary Care [name]-Ann Gal: [name] Other Clinician: Referring Jahred Tatar: Treating Shalandria Elsbernd/Extender: [name] in Treatment: 14 Edema Assessment Assessed: [name]: No] [name]: No] Edema: [Left: Ye] [Right: s] Calf Left: Right: Point of Measurement: 40 cm From Medial Instep 56 cm Ankle Left: Right: Point of Measurement: 19 cm From Medial Instep 38.5 cm Vascular Assessment Pulses: Dorsalis Pedis Palpable: [Right:No] Electronic  Signature(s) Signed: 04/19/2020 5:24:24 PM By: Kela Millin Entered By: Kela Millin on 04/19/2020 13:20:01 -------------------------------------------------------------------------------- Multi Wound Chart Details Patient Name: Date of Service: Nicholas [name] DFO RD K. 04/19/2020 1:00 PM Medical Record Number: [id number] Patient Account Number: [id number] Date of Birth/Sex: Treating RN: 04-11-1946 (74 y.o. [name] Primary Care Renton Berkley: [name] Other Clinician: Referring Meri Pelot: Treating Fabion Gatson/Extender: [name] in Treatment: 14 Vital Signs Height(in): 67 Pulse(bpm): 96 Weight(lbs): 250 Blood Pressure(mmHg): 126/78 Body Mass Index(BMI): 39 Temperature(F): 98.4 Respiratory Rate(breaths/min): 19 Photos: [20:No Photos Right, Circumferential Lower Leg] [  N/A:N/A N/A] Wound Location: [20:Gradually Appeared] [N/A:N/A] Wounding Event: [20:Lymphedema] [N/A:N/A] Primary Etiology: [20:Lymphedema, Sleep Apnea,] [N/A:N/A] Comorbid History: [20:Congestive Heart Failure, Hypertension, Peripheral Venous Disease, Received Chemotherapy 04/08/2020] [N/A:N/A] Date Acquired: [20:1] [N/A:N/A] Weeks of Treatment: [20:Open] [N/A:N/A] Wound Status: [20:1.3x0.5x0.1] [N/A:N/A] Measurements L x W x D (cm) [20:0.511] [N/A:N/A] A (cm) : rea [20:0.051] [N/A:N/A] Volume (cm) : [20:99.90%] [N/A:N/A] % Reduction in Area: [20:99.90%] [N/A:N/A] % Reduction in Volume: [20:Full Thickness Without Exposed] [N/A:N/A] Classification: [20:Support Structures Large] [N/A:N/A] Exudate Amount: [20:Serosanguineous] [N/A:N/A] Exudate Type: [20:red, brown] [N/A:N/A] Exudate Color: [20:Distinct, outline attached] [N/A:N/A] Wound Margin: [20:Large (67-100%)] [N/A:N/A] Granulation Amount: [20:Red, Pink] [N/A:N/A] Granulation Quality: [20:Small (1-33%)] [N/A:N/A] Necrotic Amount: [20:Fat Layer (Subcutaneous Tissue): Yes N/A] Exposed Structures: [20:Fascia: No  Tendon: No Muscle: No Joint: No Bone: No Small (1-33%)] [N/A:N/A] Epithelialization: [20:Compression Therapy] [N/A:N/A] Treatment Notes Electronic Signature(s) Signed: 04/19/2020 5:31:38 PM By: Levan Hurst RN, BSN Signed: 04/20/2020 7:37:15 AM By: Linton Ham MD Entered By: Linton Ham on 04/19/2020 13:56:29 -------------------------------------------------------------------------------- Multi-Disciplinary Care Plan Details Patient Name: Date of Service: Nicholas [name] DFO RD K. 04/19/2020 1:00 PM Medical Record Number: [id number] Patient Account Number: [id number] Date of Birth/Sex: Treating RN: May 31, 1946 (74 y.o. [name] Primary Care Hilliard Borges: [name] Other Clinician: Referring Laurella Tull: Treating Lynnette Pote/Extender: [name] in Treatment: 14 Active Inactive Wound/Skin Impairment Nursing Diagnoses: Knowledge deficit related to ulceration/compromised skin integrity Goals: Patient/caregiver will verbalize understanding of skin care regimen Date Initiated: 01/08/2020 Target Resolution Date: 05/03/2020 Goal Status: Active Ulcer/skin breakdown will have a volume reduction of 30% by week 4 Date Initiated: 01/08/2020 Date Inactivated: 02/05/2020 Target Resolution Date: 01/30/2020 Goal Status: Met Interventions: Assess patient/caregiver ability to obtain necessary supplies Assess patient/caregiver ability to perform ulcer/skin care regimen upon admission and as needed Provide education on ulcer and skin care Treatment Activities: Skin care regimen initiated : 01/08/2020 Topical wound management initiated : 01/08/2020 Notes: Electronic Signature(s) Signed: 04/19/2020 5:31:38 PM By: Levan Hurst RN, BSN Entered By: Levan Hurst on 04/19/2020 17:16:29 -------------------------------------------------------------------------------- Pain Assessment Details Patient Name: Date of Service: Nicholas [name] DFO RD K. 04/19/2020 1:00  PM Medical Record Number: [id number] Patient Account Number: [id number] Date of Birth/Sex: Treating RN: 12-18-1945 (74 y.o. [name] Primary Care Sadler Teschner: [name] Other Clinician: Referring Johnny Gorter: Treating Rheya Minogue/Extender: [name] in Treatment: 14 Active Problems Location of Pain Severity and Description of Pain Patient Has Paino No Site Locations Pain Management and Medication Current Pain Management: Electronic Signature(s) Signed: 04/19/2020 5:24:24 PM By: Kela Millin Signed: 04/19/2020 5:24:24 PM By: Kela Millin Entered By: Kela Millin on 04/19/2020 13:18:01 -------------------------------------------------------------------------------- Patient/Caregiver Education Details Patient Name: Date of Service: Nicholas [name] DFO RD K. 9/27/2021andnbsp1:00 PM Medical Record Number: [id number] Patient Account Number: [id number] Date of Birth/Gender: Treating RN: [birth year] (74 y.o. [name] Primary Care Physician: [name] Other Clinician: Referring Physician: Treating Physician/Extender: [name] in Treatment: 14 Education Assessment Education Provided To: Patient Education Topics Provided Wound/Skin Impairment: Methods: Explain/Verbal Responses: State content correctly [employer]) Signed: 04/19/2020 5:31:38 PM By: Levan Hurst RN, BSN Entered By: Levan Hurst on 04/19/2020 17:16:39 -------------------------------------------------------------------------------- Wound Assessment Details Patient Name: Date of Service: Nicholas [name] DFO RD K. 04/19/2020 1:00 PM Medical Record Number: [id number] Patient Account Number: [id number] Date of Birth/Sex: Treating RN: 07/10/1946 (74 y.o. [name] Primary Care Ashyia Schraeder: [name] Other Clinician: Referring Samiah Ricklefs: Treating Sharbel Sahagun/Extender: [name] in  Treatment: 14 Wound Status  Wound Number: 20 Primary Lymphedema Etiology: Wound Location: Right, Circumferential Lower Leg Wound Open Wounding Event: Gradually Appeared Status: Date Acquired: 04/08/2020 Comorbid Lymphedema, Sleep Apnea, Congestive Heart Failure, Weeks Of Treatment: 1 History: Hypertension, Peripheral Venous Disease, Received Clustered Wound: No Chemotherapy Wound Measurements Length: (cm) 1.3 Width: (cm) 0.5 Depth: (cm) 0.1 Area: (cm) 0.511 Volume: (cm) 0.051 % Reduction in Area: 99.9% % Reduction in Volume: 99.9% Epithelialization: Small (1-33%) Tunneling: No Undermining: No Wound Description Classification: Full Thickness Without Exposed Support Structures Wound Margin: Distinct, outline attached Exudate Amount: Large Exudate Type: Serosanguineous Exudate Color: red, brown Wound Bed Granulation Amount: Large (67-100%) Granulation Quality: Red, Pink Necrotic Amount: Small (1-33%) Necrotic Quality: Adherent Slough Foul Odor After Cleansing: No Slough/Fibrino Yes Exposed Structure Fascia Exposed: No Fat Layer (Subcutaneous Tissue) Exposed: Yes Tendon Exposed: No Muscle Exposed: No Joint Exposed: No Bone Exposed: No Treatment Notes Wound #20 (Right, Circumferential Lower Leg) 1. Cleanse With Wound Cleanser Soap and water 2. Periwound Care Moisturizing lotion TCA Cream 3. Primary Dressing Applied Hydrofera Blue 4. Secondary Dressing ABD Pad 6. Support Layer Applied 4 layer compression wrap Notes unna boot first layer applied to upper portion of lower leg. netting. Electronic Signature(s) Signed: 04/19/2020 5:24:24 PM By: Kela Millin Entered By: Kela Millin on 04/19/2020 13:23:37 -------------------------------------------------------------------------------- Vitals Details Patient Name: Date of Service: Nicholas [name] DFO RD K. 04/19/2020 1:00 PM Medical Record Number: [id number] Patient Account Number: [id number] Date of  Birth/Sex: Treating RN: 05-10-1946 (74 y.o. [name] Primary Care Rahmon Heigl: [name] Other Clinician: Referring Anahi Belmar: Treating Lashun Ramseyer/Extender: [name] in Treatment: 14 Vital Signs Time Taken: 13:00 Temperature (F): 98.4 Height (in): 67 Pulse (bpm): 96 Weight (lbs): 250 Respiratory Rate (breaths/min): 19 Body Mass Index (BMI): 39.2 Blood Pressure (mmHg): 126/78 Reference Range: 80 - 120 mg / dl Electronic Signature(s) Signed: 04/19/2020 5:24:24 PM By: Kela Millin Entered By: Kela Millin on 04/19/2020 13:17:55

## 2020-04-20 NOTE — Progress Notes (Signed)
Nicholas [name] ([id number]) , Visit Report for 04/19/2020 HPI Details Patient Name: Date of Service: Nicholas [name]. 04/19/2020 1:00 PM Medical Record Number: [id number] Patient Account Number: [id number] Date of Birth/Sex: Treating RN: 10-May-1946 (74 y.o. [name] Primary Care Provider: [name] Other Clinician: Referring Provider: Treating Provider/Extender: [name] in Treatment: 14 History of Present Illness HPI Description: ADMISSION 07/04/2018 This is a 74 year old man who has bilateral lymphedema felt to be secondary to previous treatment for malignant melanoma on his left toes. 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 Malignant melanoma was treated with immunotherapy. He is not had radiation. Sometime after this he developed edema in the left leg that spread into the right leg. He has bilateral compression pumps at home and he uses them twice a day and claims to be quite compliant. He also has a juxta lite type stocking at home which is apparently 72-year-old. He states that he fell in early October dislocating and fracturing his left shoulder. He slept in a recliner with his legs dependent. He was seen and followed at the rehab clinic at [facility] and was having lymphedema wraps done by 1 of the PT who noted his open wounds last week and he has been referred here for management. The patient has 2 open wounds on the right s distal lower leg and one on the left anterior lower leg The patient is not a diabetic. 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 For one reason or another we could not get him set up with home health and he kept the wraps on all week which he generally seem to tolerate. He did not use his compression pumps over the top of the wraps which I told him he could do this week. He comes in with a new rash on both feet which looks like tinea 07/22/2018; much better edema control bilaterally. The wounds on the right lateral calf and left anterior lower leg both look a lot better. There is epithelialization over both surfaces but I think this is fragile and I am going to put him in our compression wraps for another week. He says he is been using his compression pumps at home. He has a constellation of lower extremity compression garments that he is going to bring in next week. In discussion with him I cannot really get a sense of everything he has. I think he was at one point prescribed 30-40 below-knee stockings but he points out he could not get them on. 07/29/2018; the patient arrives in his legs are healed. He has a constellation of stockings none of which I think are going to be that helpful except for the fact that he has external wraparound stockings of some description. He has above-knee stockings but I cannot imagine it would be possible to get these on READMISSION 01/08/2020 Patient is now a 74 year old man. We had him for a short period from December 2019 through January 2020. He has bilateral lower extremity lymphedema which he initially traces to treatment for malignant melanoma 7 years ago. He has been followed at the lymphedema clinic on  [place] and recently has compression pumps that go up to his mid abdomen. He recently developed superficial wounds bilaterally. They will not manage these in the lymphedema clinic where the services are run by therapy. He has not been using his compression pumps. His significant other cannot get the stockings on his legs and the patient is only limited ability to help because  of Parkinson's disease Past medical history is essentially unchanged. He has sleep apnea, Parkinson's disease at some point labeled his Parkinson's plus, bilateral lymphedema, history of malignant melanoma. He does not have an arterial issue that I am aware of although his previous arterial studies have been noncompressible 7/1; patient we admitted the clinic 2 weeks ago. He has severe bilateral lymphedema. He had wounds predominantly on the left leg but also superficially on the right. He has compression pumps which he says he is using once a day. 7/15; 2-week follow-up. He comes in for nurse visit. He has severe bilateral lymphedema. He has upper abdominal level compression pumps which she is using twice a day. We have been wrapping both his lower legs the areas on the right leg are healed. He still has an area of left upper lateral and right anterior medial. Anterior medial wound is small. He is tolerating this well. 7/22; still significant lymphedema even with 4-layer compression and external compression pump usage. He said he had some itching on the right anterior leg he comes in with a large superficial area of skin breakdown anteriorly and a smaller area laterally on the right leg. He also has a rash on the left dorsal ankle crease. Some of these look pustular. I wonder if this is an occlusion issue from foam we put down here. He also states the only thing different this week is the did a 5-hour car ride with his legs dependent. There was nothing open on the right leg last week but we wrapped it in any case 7/27; culture of the blisters on his left anterior leg grew staph aureus. I gave him doxycycline which he started yesterday. He is already appear cleaner and dry. His major wound is on the right anterior lower leg and the right lateral lower leg. There is nothing else open on the left leg. He has better edema control today. 03/04/20-Patient comes in with new areas after he had a fall, on his  left dorsal foot and right dorsal foot and left medial proximal leg, there is significant amount of drainage into his dressing that was removed today which is actually greenish in color. It looks like he is completed a course of doxycycline for staph that grew from his blisters however he might need antipseudomonal coverage at this point 8/16; apparently the wounds originally were all healed until his last visit when after a fall he had areas on the left dorsal foot, right dorsal foot and left medial proximal leg. T oday all of these have healed and he has a small area on the left medial ankle. The patient has severe bilateral lymphedema which is managed usually with compression stockings/external compression stockings as well his mid abdomen compression pumps. He is still using the latter twice a day. 8/23; the wounds on his dorsal feet and medial ankle that he had from a fall last time of closed however he has a new wrap injury on the left medial upper leg and a smaller 1 anteriorly. He is using his compression pumps twice a day 8/30; nothing open on the left leg. He is  in his stocking. Using his compression pumps twice a day. From last week still a small opening in the medial upper leg on the right he has a new area in the upper mid tibia. He thinks this might have been a wrap injury looks more like the remanent of the blister 9/7; the areas on the right anterior and right medial lower leg have both closed over. He came in for a nurse visit today but I looked at the right leg and indeed it is healed. His edema control is moderate but certainly a lot better than when I first saw this. He is using his compression pumps twice a day over his juxta lite stockings 9/14; the area on the right anterior and right medial lower legs closed over last week and we discharged him into his own external compression stocking. This is in accompaniment of abdominal level external compression pumps that he uses for 45  minutes twice a day. He states that the control lasted till about Friday he started to have weeping edema and then by Saturday things look like they were starting to breakdown. He arrives in clinic today with a large erythematous area on the right lateral calf with weeping edema fluid also on the medial and anterior ankle. 9/21; this is a patient I actually discharged from clinic 2 weeks ago into his own stocking and external compression pumps. He arrived last week with uncontrolled edema and weeping fluid from the right leg we. We put him back in compression he has a juxta lite stocking I believe on the left leg which does not have any open wounds. He arrives in clinic today weeping open wounds on the right lateral calf. Apparently some purulent drainage when he came in. Most impressive for diffuse very tender erythema extending up to the level of his tibial tuberosity. 9/27; patient I sent to the hospital last week with an intense cellulitis of the right leg. He was admitted from 9/20 through 04/15/2020 he received broad-spectrum IV antibiotics and was discharged on Keflex and ciprofloxacin. His leg is a lot better he only has 1 open wound on the right lateral lower leg Electronic Signature(s) Signed: 04/20/2020 7:37:15 AM By: Linton Ham MD Entered By: Linton Ham on 04/19/2020 13:57:24 -------------------------------------------------------------------------------- Physical Exam Details Patient Name: Date of Service: Nicholas [name]. 04/19/2020 1:00 PM Medical Record Number: [id number] Patient Account Number: [id number] Date of Birth/Sex: Treating RN: 07-08-1946 (74 y.o. [name] Primary Care Provider: [name] Other Clinician: Referring Provider: Treating Provider/Extender: [name] in Treatment: 14 Constitutional Sitting or standing Blood Pressure is within target range for patient.. Pulse regular and within target range for  patient.Marland Kitchen Respirations regular, non-labored and within target range.. Temperature is normal and within the target range for the patient.Marland Kitchen Appears in no distress. Cardiovascular Lymphedema on the right is poorly controlled. Integumentary (Hair, Skin) Subcutaneous hemorrhagic erythema but nontender on the right lower leg. Notes Wound exam; he only has 1 small oval-shaped wound on the right lateral calf. A lot of erythema perhaps some subdermal tissue damage a lot of punctate small hemorrhages almost confluently in the lower leg Electronic Signature(s) Signed: 04/20/2020 7:37:15 AM By: Linton Ham MD Entered By: Linton Ham on 04/19/2020 14:00:14 -------------------------------------------------------------------------------- Physician Orders Details Patient Name: Date of Service: Nicholas [name]. 04/19/2020 1:00 PM Medical Record Number: [id number] Patient Account Number: [id number] Date of Birth/Sex: Treating RN: [birth year] (74 y.o. [name] Primary Care  Provider: [name] Other Clinician: Referring Provider: Treating Provider/Extender: [name] in Treatment: 14 Verbal / Phone Orders: No Diagnosis Coding ICD-10 Coding Code Description I89.0 Lymphedema, not elsewhere classified L97.811 Non-pressure chronic ulcer of other part of right lower leg limited to breakdown of skin L03.115 Cellulitis of right lower limb Follow-up Appointments Return Appointment in 1 week. Dressing Change Frequency Do not change entire dressing for one week. Skin Barriers/Peri-Wound Care Moisturizing lotion TCA Cream or Ointment - mixed with lotion Wound Cleansing May shower with protection. - with cast protectors Primary Wound Dressing Wound #20 Right,Circumferential Lower Leg Hydrofera Blue Secondary Dressing Wound #20 Right,Circumferential Lower Leg Dry Gauze ABD pad Edema Control 4 layer compression - Right Lower Extremity - unna boot top  of wrap to help anchor Avoid standing for long periods of time Elevate legs to the level of the heart or above for 30 minutes daily and/or when sitting, a frequency of: - throughout the day. Exercise regularly Support Garment 30-40 mm/Hg pressure to: - Farrow wrap to left leg daily Segmental Compressive Device. - use lymphedema pumps twice a day an hour each time. Once in the morning and once in the evening. Electronic Signature(s) Signed: 04/19/2020 5:31:38 PM By: Levan Hurst RN, BSN Signed: 04/20/2020 7:37:15 AM By: Linton Ham MD Entered By: Levan Hurst on 04/19/2020 13:42:27 -------------------------------------------------------------------------------- Problem List Details Patient Name: Date of Service: Nicholas [name]. 04/19/2020 1:00 PM Medical Record Number: [id number] Patient Account Number: [id number] Date of Birth/Sex: Treating RN: 03-Jul-1946 (74 y.o. 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 04/19/2020 1:00 PM Medical Record Number:  [id number] Patient Account Number: [id number] Date of Birth/Sex: Treating RN: 17-May-1946 (74 y.o. [name] Primary Care Provider: [name] Other Clinician: Referring Provider: Treating Provider/Extender: [name] in Treatment: 14 Subjective History of Present Illness (HPI) ADMISSION 07/04/2018 This is a 74 year old man who has bilateral lymphedema felt to be secondary to previous treatment for malignant melanoma on his left toes. 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 Malignant melanoma was treated with immunotherapy. He is not had radiation. Sometime after this he developed edema in the left leg that spread into the right leg. He has bilateral compression pumps at home and he uses them twice a day and claims to be quite compliant. He also has a juxta lite type stocking at home which is apparently 69-year-old. He states that he fell in early October dislocating and fracturing his left shoulder. He slept in a recliner with his legs dependent. He was seen and followed at the rehab clinic at [facility] and was having lymphedema wraps done by 1 of the PT who noted his open wounds last week and he  has been referred here for management. The patient has 2 open wounds on the right s distal lower leg and one on the left anterior lower leg The patient is not a diabetic. 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 For one reason or another we could not get him set up with home health and he kept the wraps on all week which he generally seem to tolerate. He did not use his  compression pumps over the top of the wraps which I told him he could do this week. He comes in with a new rash on both feet which looks like tinea 07/22/2018; much better edema control bilaterally. The wounds on the right lateral calf and left anterior lower leg both look a lot better. There is epithelialization over both surfaces but I think this is fragile and I am going to put him in our compression wraps for another week. He says he is been using his compression pumps at home. ooHe has a constellation of lower extremity compression garments that he is going to bring in next week. In discussion with him I cannot really get a sense of everything he has. I think he was at one point prescribed 30-40 below-knee stockings but he points out he could not get them on. 07/29/2018; the patient arrives in his legs are healed. He has a constellation of stockings none of which I think are going to be that helpful except for the fact that he has external wraparound stockings of some description. He has above-knee stockings but I cannot imagine it would be possible to get these on READMISSION 01/08/2020 Patient is now a 74 year old man. We had him for a short period from December 2019 through January 2020. He has bilateral lower extremity lymphedema which he initially traces to treatment for malignant melanoma 7 years ago. He has been followed at the lymphedema clinic on [facility] and recently has compression pumps that go up to his mid abdomen. He recently developed superficial wounds bilaterally. They will not manage these in the lymphedema clinic where the services are run by therapy. He has not been using his compression pumps. His significant other cannot get the stockings on his legs and the patient is only limited ability to help because of Parkinson's disease Past medical history is essentially unchanged. He has sleep apnea, Parkinson's disease at some point labeled his Parkinson's plus, bilateral  lymphedema, history of malignant melanoma. He does not have an arterial issue that I am aware of although his previous arterial studies have been noncompressible 7/1; patient we admitted the clinic 2 weeks ago. He has severe bilateral lymphedema. He had wounds predominantly on the left leg but also superficially on the right. He has compression pumps which he says he is using once a day. 7/15; 2-week follow-up. He comes in for nurse visit. He has severe bilateral lymphedema. He has upper abdominal level compression pumps which she is using twice a day. We have been wrapping both his lower legs the areas on the right leg are healed. He still has an area of left upper lateral and right anterior medial. Anterior medial wound is small. He is tolerating this well. 7/22; still significant lymphedema even  with 4-layer compression and external compression pump usage. He said he had some itching on the right anterior leg he comes in with a large superficial area of skin breakdown anteriorly and a smaller area laterally on the right leg. He also has a rash on the left dorsal ankle crease. Some of these look pustular. I wonder if this is an occlusion issue from foam we put down here. He also states the only thing different this week is the did a 5-hour car ride with his legs dependent. There was nothing open on the right leg last week but we wrapped it in any case 7/27; culture of the blisters on his left anterior leg grew staph aureus. I gave him doxycycline which he started yesterday. He is already appear cleaner and dry. His major wound is on the right anterior lower leg and the right lateral lower leg. There is nothing else open on the left leg. He has better edema control today. 03/04/20-Patient comes in with new areas after he had a fall, on his left dorsal foot and right dorsal foot and left medial proximal leg, there is significant amount of drainage into his dressing that was removed today which is  actually greenish in color. It looks like he is completed a course of doxycycline for staph that grew from his blisters however he might need antipseudomonal coverage at this point 8/16; apparently the wounds originally were all healed until his last visit when after a fall he had areas on the left dorsal foot, right dorsal foot and left medial proximal leg. T oday all of these have healed and he has a small area on the left medial ankle. The patient has severe bilateral lymphedema which is managed usually with compression stockings/external compression stockings as well his mid abdomen compression pumps. He is still using the latter twice a day. 8/23; the wounds on his dorsal feet and medial ankle that he had from a fall last time of closed however he has a new wrap injury on the left medial upper leg and a smaller 1 anteriorly. He is using his compression pumps twice a day 8/30; nothing open on the left leg. He is in his stocking. Using his compression pumps twice a day. From last week still a small opening in the medial upper leg on the right he has a new area in the upper mid tibia. He thinks this might have been a wrap injury looks more like the remanent of the blister 9/7; the areas on the right anterior and right medial lower leg have both closed over. He came in for a nurse visit today but I looked at the right leg and indeed it is healed. His edema control is moderate but certainly a lot better than when I first saw this. He is using his compression pumps twice a day over his juxta lite stockings 9/14; the area on the right anterior and right medial lower legs closed over last week and we discharged him into his own external compression stocking. This is in accompaniment of abdominal level external compression pumps that he uses for 45 minutes twice a day. He states that the control lasted till about Friday he started to have weeping edema and then by Saturday things look like they were  starting to breakdown. He arrives in clinic today with a large erythematous area on the right lateral calf with weeping edema fluid also on the medial and anterior ankle. 9/21; this is a patient I actually discharged from  clinic 2 weeks ago into his own stocking and external compression pumps. He arrived last week with uncontrolled edema and weeping fluid from the right leg we. We put him back in compression he has a juxta lite stocking I believe on the left leg which does not have any open wounds. ooHe arrives in clinic today weeping open wounds on the right lateral calf. Apparently some purulent drainage when he came in. Most impressive for diffuse very tender erythema extending up to the level of his tibial tuberosity. 9/27; patient I sent to the hospital last week with an intense cellulitis of the right leg. He was admitted from 9/20 through 04/15/2020 he received broad-spectrum IV antibiotics and was discharged on Keflex and ciprofloxacin. His leg is a lot better he only has 1 open wound on the right lateral lower leg Objective Constitutional Sitting or standing Blood Pressure is within target range for patient.. Pulse regular and within target range for patient.Marland Kitchen Respirations regular, non-labored and within target range.. Temperature is normal and within the target range for the patient.Marland Kitchen Appears in no distress. Vitals Time Taken: 1:00 PM, Height: 67 in, Weight: 250 lbs, BMI: 39.2, Temperature: 98.4 F, Pulse: 96 bpm, Respiratory Rate: 19 breaths/min, Blood Pressure: 126/78 mmHg. Cardiovascular Lymphedema on the right is poorly controlled. General Notes: Wound exam; he only has 1 small oval-shaped wound on the right lateral calf. A lot of erythema perhaps some subdermal tissue damage a lot of punctate small hemorrhages almost confluently in the lower leg Integumentary (Hair, Skin) Subcutaneous hemorrhagic erythema but nontender on the right lower leg. Wound #20 status is Open. Original  cause of wound was Gradually Appeared. The wound is located on the Right,Circumferential Lower Leg. The wound measures 1.3cm length x 0.5cm width x 0.1cm depth; 0.511cm^2 area and 0.051cm^3 volume. There is Fat Layer (Subcutaneous Tissue) exposed. There is no tunneling or undermining noted. There is a large amount of serosanguineous drainage noted. The wound margin is distinct with the outline attached to the wound base. There is large (67-100%) red, pink granulation within the wound bed. There is a small (1-33%) amount of necrotic tissue within the wound bed including Adherent Slough. Assessment Active Problems ICD-10 Lymphedema, not elsewhere classified Non-pressure chronic ulcer of other part of right lower leg limited to breakdown of skin Cellulitis of right lower limb Procedures Wound #20 Pre-procedure diagnosis of Wound #20 is a Lymphedema located on the Right,Circumferential Lower Leg . There was a Four Layer Compression Therapy Procedure by Levan Hurst, RN. Post procedure Diagnosis Wound #20: Same as Pre-Procedure Plan Follow-up Appointments: Return Appointment in 1 week. Dressing Change Frequency: Do not change entire dressing for one week. Skin Barriers/Peri-Wound Care: Moisturizing lotion TCA Cream or Ointment - mixed with lotion Wound Cleansing: May shower with protection. - with cast protectors Primary Wound Dressing: Wound #20 Right,Circumferential Lower Leg: Hydrofera Blue Secondary Dressing: Wound #20 Right,Circumferential Lower Leg: Dry Gauze ABD pad Edema Control: 4 layer compression - Right Lower Extremity - unna boot top of wrap to help anchor Avoid standing for long periods of time Elevate legs to the level of the heart or above for 30 minutes daily and/or when sitting, a frequency of: - throughout the day. Exercise regularly Support Garment 30-40 mm/Hg pressure to: - Farrow wrap to left leg daily Segmental Compressive Device. - use lymphedema pumps  twice a day an hour each time. Once in the morning and once in the evening. 1. I change the primary dressing to Hydrofera Blue to this  wound area/ABDs under 4-layer compression 2. I had some concern about the skin in his lower leg I think this is probably poorly controlled lymphedema but probably significant damage caused by the underlying cellulitis. I am hopeful under compression that this will not open into more wounded area Electronic Signature(s) Signed: 04/20/2020 7:37:15 AM By: Linton Ham MD Entered By: Linton Ham on 04/19/2020 14:01:28 -------------------------------------------------------------------------------- SuperBill Details Patient Name: Date of Service: Nicholas [name]. 04/19/2020 Medical Record Number: [id number] Patient Account Number: [id number] Date of Birth/Sex: Treating RN: Oct 18, 1945 (74 y.o. [name] Primary Care Provider: [name] Other Clinician: Referring Provider: Treating Provider/Extender: [name] in Treatment: 14 Diagnosis Coding ICD-10 Codes Code Description I89.0 Lymphedema, not elsewhere classified L97.811 Non-pressure chronic ulcer of other part of right lower leg limited to breakdown of skin L03.115 Cellulitis of right lower limb Facility Procedures CPT4 Code: [id number] Description: (Facility Use Only) [id number] - [place] LWR RT LEG Modifier: Quantity: 1 Physician Procedures Electronic Signature(s) Signed: 04/19/2020 5:31:38 PM By: Levan Hurst RN, BSN Signed: 04/20/2020 7:37:15 AM By: Linton Ham MD Entered By: Levan Hurst on 04/19/2020 17:16:57

## 2020-04-26 ENCOUNTER — Encounter (HOSPITAL_BASED_OUTPATIENT_CLINIC_OR_DEPARTMENT_OTHER): Payer: PPO | Attending: Internal Medicine | Admitting: Internal Medicine

## 2020-04-26 DIAGNOSIS — Z85820 Personal history of malignant melanoma of skin: Secondary | ICD-10-CM | POA: Diagnosis not present

## 2020-04-26 DIAGNOSIS — Z89422 Acquired absence of other left toe(s): Secondary | ICD-10-CM | POA: Diagnosis not present

## 2020-04-26 DIAGNOSIS — L97811 Non-pressure chronic ulcer of other part of right lower leg limited to breakdown of skin: Secondary | ICD-10-CM | POA: Diagnosis not present

## 2020-04-26 DIAGNOSIS — I890 Lymphedema, not elsewhere classified: Secondary | ICD-10-CM | POA: Diagnosis not present

## 2020-04-26 NOTE — Progress Notes (Signed)
WILLET SCHLEIFER ([id number]) , Visit Report for 04/26/2020 HPI Details Patient Name: Date of Service: Nicholas Junes DFO RD K. 04/26/2020 2:00 PM Medical Record Number: [id number] Patient Account Number: [id number] Date of Birth/Sex: Treating RN: [birth year] (74 y.o. [name] Primary Care Provider: [name] Other Clinician: Referring Provider: Treating Provider/Extender: [name] in Treatment: 15 History of Present Illness HPI Description: ADMISSION 07/04/2018 This is a 74 year old man who has bilateral lymphedema felt to be secondary to previous treatment for malignant melanoma on his left toes. 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 Malignant melanoma was treated with immunotherapy. He is not had radiation. Sometime after this he developed edema in the left leg that spread into the right leg. He has bilateral compression pumps at home and he uses them twice a day and claims to be quite compliant. He also has a juxta lite type stocking at home which is apparently 51-year-old. He states that he fell in early October dislocating and fracturing his left shoulder. He slept in a recliner with his legs dependent. He was seen and followed at the rehab clinic at [facility] and was having lymphedema wraps done by 1 of the PT who noted his open wounds last week and he has been referred here for management. The patient has 2 open wounds on the right s distal lower leg and one on the left anterior lower leg The patient is not a diabetic. 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 For one reason or another we could not get him set up with home health and he kept the wraps on all week which he generally seem to tolerate. He did not use his compression pumps over the top of the wraps which I told him he could do this week. He comes in with a new rash on both feet which looks like tinea 07/22/2018; much better edema control bilaterally. The wounds on the right lateral calf and left anterior lower leg both look a lot better. There is epithelialization over both surfaces but I think this is fragile and I am going to put him in our compression wraps for another week. He says he is been using his compression pumps at home. He has a constellation of lower extremity compression garments that he is going to bring in next week. In discussion with him I cannot really get a sense of everything he has. I think he was at one point prescribed 30-40 below-knee stockings but he points out he could not get them on. 07/29/2018; the patient arrives in his legs are healed. He has a constellation of stockings none of which I think are going to be that helpful except for the fact that he has external wraparound stockings of some description. He has above-knee stockings but I cannot imagine it would be possible to get these on READMISSION 01/08/2020 Patient is now a 74 year old man. We had him for a short period from December 2019 through January 2020. He has bilateral lower extremity lymphedema which he initially traces to treatment for malignant melanoma 7 years ago. He has been followed at the lymphedema clinic on  [place] and recently has compression pumps that go up to his mid abdomen. He recently developed superficial wounds bilaterally. They will not manage these in the lymphedema clinic where the services are run by therapy. He has not been using his compression pumps. His significant other cannot get the stockings on his legs and the patient is only limited ability to help because  of Parkinson's disease Past medical history is essentially unchanged. He has sleep apnea, Parkinson's disease at some point labeled his Parkinson's plus, bilateral lymphedema, history of malignant melanoma. He does not have an arterial issue that I am aware of although his previous arterial studies have been noncompressible 7/1; patient we admitted the clinic 2 weeks ago. He has severe bilateral lymphedema. He had wounds predominantly on the left leg but also superficially on the right. He has compression pumps which he says he is using once a day. 7/15; 2-week follow-up. He comes in for nurse visit. He has severe bilateral lymphedema. He has upper abdominal level compression pumps which she is using twice a day. We have been wrapping both his lower legs the areas on the right leg are healed. He still has an area of left upper lateral and right anterior medial. Anterior medial wound is small. He is tolerating this well. 7/22; still significant lymphedema even with 4-layer compression and external compression pump usage. He said he had some itching on the right anterior leg he comes in with a large superficial area of skin breakdown anteriorly and a smaller area laterally on the right leg. He also has a rash on the left dorsal ankle crease. Some of these look pustular. I wonder if this is an occlusion issue from foam we put down here. He also states the only thing different this week is the did a 5-hour car ride with his legs dependent. There was nothing open on the right leg last week but we wrapped it in any case 7/27; culture of the blisters on his left anterior leg grew staph aureus. I gave him doxycycline which he started yesterday. He is already appear cleaner and dry. His major wound is on the right anterior lower leg and the right lateral lower leg. There is nothing else open on the left leg. He has better edema control today. 03/04/20-Patient comes in with new areas after he had a fall, on his  left dorsal foot and right dorsal foot and left medial proximal leg, there is significant amount of drainage into his dressing that was removed today which is actually greenish in color. It looks like he is completed a course of doxycycline for staph that grew from his blisters however he might need antipseudomonal coverage at this point 8/16; apparently the wounds originally were all healed until his last visit when after a fall he had areas on the left dorsal foot, right dorsal foot and left medial proximal leg. T oday all of these have healed and he has a small area on the left medial ankle. The patient has severe bilateral lymphedema which is managed usually with compression stockings/external compression stockings as well his mid abdomen compression pumps. He is still using the latter twice a day. 8/23; the wounds on his dorsal feet and medial ankle that he had from a fall last time of closed however he has a new wrap injury on the left medial upper leg and a smaller 1 anteriorly. He is using his compression pumps twice a day 8/30; nothing open on the left leg. He is  in his stocking. Using his compression pumps twice a day. From last week still a small opening in the medial upper leg on the right he has a new area in the upper mid tibia. He thinks this might have been a wrap injury looks more like the remanent of the blister 9/7; the areas on the right anterior and right medial lower leg have both closed over. He came in for a nurse visit today but I looked at the right leg and indeed it is healed. His edema control is moderate but certainly a lot better than when I first saw this. He is using his compression pumps twice a day over his juxta lite stockings 9/14; the area on the right anterior and right medial lower legs closed over last week and we discharged him into his own external compression stocking. This is in accompaniment of abdominal level external compression pumps that he uses for 45  minutes twice a day. He states that the control lasted till about Friday he started to have weeping edema and then by Saturday things look like they were starting to breakdown. He arrives in clinic today with a large erythematous area on the right lateral calf with weeping edema fluid also on the medial and anterior ankle. 9/21; this is a patient I actually discharged from clinic 2 weeks ago into his own stocking and external compression pumps. He arrived last week with uncontrolled edema and weeping fluid from the right leg we. We put him back in compression he has a juxta lite stocking I believe on the left leg which does not have any open wounds. He arrives in clinic today weeping open wounds on the right lateral calf. Apparently some purulent drainage when he came in. Most impressive for diffuse very tender erythema extending up to the level of his tibial tuberosity. 9/27; patient I sent to the hospital last week with an intense cellulitis of the right leg. He was admitted from 9/20 through 04/15/2020 he received broad-spectrum IV antibiotics and was discharged on Keflex and ciprofloxacin. His leg is a lot better he only has 1 open wound on the right lateral lower leg 10/4; patient with severe bilateral lymphedema. He has recovered from the cellulitis of a few weeks ago at which time we had to send him to the emergency room.. We have also discharge this man in early September but he had a quick turnaround. He has abdominal level external compression pumps he uses these twice a day. He has Farrow wrap lower extremity stockings Electronic Signature(s) Signed: 04/26/2020 4:54:01 PM By: Linton Ham MD Entered By: Linton Ham on 04/26/2020 15:17:12 -------------------------------------------------------------------------------- Physical Exam Details Patient Name: Date of Service: Nicholas Junes DFO RD K. 04/26/2020 2:00 PM Medical Record Number: [id number] Patient Account Number: [id number] Date  of Birth/Sex: Treating RN: 09-12-1945 (74 y.o. [name] Primary Care Provider: [name] Other Clinician: Referring Provider: Treating Provider/Extender: [name] in Treatment: 15 Constitutional Sitting or standing Blood Pressure is within target range for patient.. Pulse regular and within target range for patient.Marland Kitchen Respirations regular, non-labored and within target range.. Temperature is normal and within the target range for the patient.Marland Kitchen Appears in no distress. Notes Wound exam; everything on the right lateral calf is closed. He still has erythema likely secondary to chronic stasis. His edema control is adequate certainly not tense Electronic Signature(s) Signed: 04/26/2020 4:54:01 PM By: Linton Ham MD Entered By: Linton Ham on 04/26/2020 15:19:33 -------------------------------------------------------------------------------- Physician Orders Details Patient  Name: Date of Service: Nicholas Junes DFO RD K. 04/26/2020 2:00 PM Medical Record Number: [id number] Patient Account Number: [id number] Date of Birth/Sex: Treating RN: 09-Oct-1945 (74 y.o. [name] Primary Care Provider: [name] Other Clinician: Referring Provider: Treating Provider/Extender: [name] in Treatment: 15 Verbal / Phone Orders: No Diagnosis Coding ICD-10 Coding Code Description I89.0 Lymphedema, not elsewhere classified L97.811 Non-pressure chronic ulcer of other part of right lower leg limited to breakdown of skin L03.115 Cellulitis of right lower limb Discharge From [facility] Services Discharge from [place] Moisturizing lotion - to legs daily Wound Cleansing May shower with protection. - with cast protectors Edema Control Avoid standing for long periods of time Elevate legs to the level of the heart or above for 30 minutes daily and/or when sitting, a frequency of: -  throughout the day. Exercise regularly Support Garment 30-40 mm/Hg pressure to: - Farrow wrap to both legs daily Segmental Compressive Device. - use lymphedema pumps twice a day an hour each time. Once in the morning and once in the evening. Electronic Signature(s) Signed: 04/26/2020 4:54:01 PM By: Linton Ham MD Signed: 04/26/2020 5:03:51 PM By: Baruch Gouty RN, BSN Entered By: Baruch Gouty on 04/26/2020 14:17:22 -------------------------------------------------------------------------------- Problem List Details Patient Name: Date of Service: Nicholas Junes DFO RD K. 04/26/2020 2:00 PM Medical Record Number: [id number] Patient Account Number: [id number] Date of Birth/Sex: Treating RN: May 03, 1946 (74 y.o. 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 04/26/2020 2:00 PM Medical Record Number: [id number] Patient Account Number: [id number] Date of Birth/Sex: Treating RN: 1946/01/21 (74 y.o. [name] Primary Care Provider: [name] Other  Clinician: Referring Provider: Treating Provider/Extender: [name] in Treatment: 15 Subjective History of Present Illness (HPI) ADMISSION 07/04/2018 This is a 75 year old man who has bilateral lymphedema felt to be secondary to previous treatment for malignant melanoma on his left toes. 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 Malignant melanoma was treated with immunotherapy. He is not had radiation. Sometime after this he developed edema in the left leg that spread into the right leg. He has bilateral compression pumps at home and he uses them twice a day and claims to be quite compliant. He also has a juxta lite type stocking at home which is apparently 109-year-old. He states that he fell in early October dislocating and fracturing his left shoulder. He slept in a recliner with his legs dependent. He was seen and followed at the rehab clinic at [facility] and was having lymphedema wraps done by 1 of the PT who noted his open wounds last week and he has been referred here for management. The patient has 2 open wounds on the right  s distal lower leg and one on the left anterior lower leg The patient is not a diabetic. 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 For one reason or another we could not get him set up with home health and he kept the wraps on all week which he generally seem to tolerate. He did not use his compression pumps over the top of the wraps which I told him he could do this week. He comes in with a new rash on both feet which looks like tinea 07/22/2018;  much better edema control bilaterally. The wounds on the right lateral calf and left anterior lower leg both look a lot better. There is epithelialization over both surfaces but I think this is fragile and I am going to put him in our compression wraps for another week. He says he is been using his compression pumps at home. ooHe has a constellation of lower extremity compression garments that he is going to bring in next week. In discussion with him I cannot really get a sense of everything he has. I think he was at one point prescribed 30-40 below-knee stockings but he points out he could not get them on. 07/29/2018; the patient arrives in his legs are healed. He has a constellation of stockings none of which I think are going to be that helpful except for the fact that he has external wraparound stockings of some description. He has above-knee stockings but I cannot imagine it would be possible to get these on READMISSION 01/08/2020 Patient is now a 74 year old man. We had him for a short period from December 2019 through January 2020. He has bilateral lower extremity lymphedema which he initially traces to treatment for malignant melanoma 7 years ago. He has been followed at the lymphedema clinic on [facility] and recently has compression pumps that go up to his mid abdomen. He recently developed superficial wounds bilaterally. They will not manage these in the lymphedema clinic where the services are run by therapy. He has not been using his compression pumps. His significant other cannot get the stockings on his legs and the patient is only limited ability to help because of Parkinson's disease Past medical history is essentially unchanged. He has sleep apnea, Parkinson's disease at some point labeled his Parkinson's plus, bilateral lymphedema, history of malignant melanoma. He does not have an arterial issue that I am aware of although his previous arterial studies have been  noncompressible 7/1; patient we admitted the clinic 2 weeks ago. He has severe bilateral lymphedema. He had wounds predominantly on the left leg but also superficially on the right. He has compression pumps which he says he is using once a day. 7/15; 2-week follow-up. He comes in for nurse visit. He has severe bilateral lymphedema. He has upper abdominal level compression pumps which she is using twice a day. We have been wrapping both his lower legs the areas on the right leg are healed. He still has an area of left upper lateral and right anterior medial. Anterior medial wound is small. He is tolerating this well. 7/22; still significant lymphedema even with 4-layer compression and external compression pump usage. He said he had some itching on  the right anterior leg he comes in with a large superficial area of skin breakdown anteriorly and a smaller area laterally on the right leg. He also has a rash on the left dorsal ankle crease. Some of these look pustular. I wonder if this is an occlusion issue from foam we put down here. He also states the only thing different this week is the did a 5-hour car ride with his legs dependent. There was nothing open on the right leg last week but we wrapped it in any case 7/27; culture of the blisters on his left anterior leg grew staph aureus. I gave him doxycycline which he started yesterday. He is already appear cleaner and dry. His major wound is on the right anterior lower leg and the right lateral lower leg. There is nothing else open on the left leg. He has better edema control today. 03/04/20-Patient comes in with new areas after he had a fall, on his left dorsal foot and right dorsal foot and left medial proximal leg, there is significant amount of drainage into his dressing that was removed today which is actually greenish in color. It looks like he is completed a course of doxycycline for staph that grew from his blisters however he might need  antipseudomonal coverage at this point 8/16; apparently the wounds originally were all healed until his last visit when after a fall he had areas on the left dorsal foot, right dorsal foot and left medial proximal leg. T oday all of these have healed and he has a small area on the left medial ankle. The patient has severe bilateral lymphedema which is managed usually with compression stockings/external compression stockings as well his mid abdomen compression pumps. He is still using the latter twice a day. 8/23; the wounds on his dorsal feet and medial ankle that he had from a fall last time of closed however he has a new wrap injury on the left medial upper leg and a smaller 1 anteriorly. He is using his compression pumps twice a day 8/30; nothing open on the left leg. He is in his stocking. Using his compression pumps twice a day. From last week still a small opening in the medial upper leg on the right he has a new area in the upper mid tibia. He thinks this might have been a wrap injury looks more like the remanent of the blister 9/7; the areas on the right anterior and right medial lower leg have both closed over. He came in for a nurse visit today but I looked at the right leg and indeed it is healed. His edema control is moderate but certainly a lot better than when I first saw this. He is using his compression pumps twice a day over his juxta lite stockings 9/14; the area on the right anterior and right medial lower legs closed over last week and we discharged him into his own external compression stocking. This is in accompaniment of abdominal level external compression pumps that he uses for 45 minutes twice a day. He states that the control lasted till about Friday he started to have weeping edema and then by Saturday things look like they were starting to breakdown. He arrives in clinic today with a large erythematous area on the right lateral calf with weeping edema fluid also on the  medial and anterior ankle. 9/21; this is a patient I actually discharged from clinic 2 weeks ago into his own stocking and external compression pumps. He arrived last  week with uncontrolled edema and weeping fluid from the right leg we. We put him back in compression he has a juxta lite stocking I believe on the left leg which does not have any open wounds. ooHe arrives in clinic today weeping open wounds on the right lateral calf. Apparently some purulent drainage when he came in. Most impressive for diffuse very tender erythema extending up to the level of his tibial tuberosity. 9/27; patient I sent to the hospital last week with an intense cellulitis of the right leg. He was admitted from 9/20 through 04/15/2020 he received broad-spectrum IV antibiotics and was discharged on Keflex and ciprofloxacin. His leg is a lot better he only has 1 open wound on the right lateral lower leg 10/4; patient with severe bilateral lymphedema. He has recovered from the cellulitis of a few weeks ago at which time we had to send him to the emergency room.. We have also discharge this man in early September but he had a quick turnaround. He has abdominal level external compression pumps he uses these twice a day. He has Farrow wrap lower extremity stockings Objective Constitutional Sitting or standing Blood Pressure is within target range for patient.. Pulse regular and within target range for patient.Marland Kitchen Respirations regular, non-labored and within target range.. Temperature is normal and within the target range for the patient.Marland Kitchen Appears in no distress. Vitals Time Taken: 1:53 PM, Height: 67 in, Weight: 250 lbs, BMI: 39.2, Temperature: 98.4 F, Pulse: 88 bpm, Respiratory Rate: 20 breaths/min, Blood Pressure: 118/77 mmHg. General Notes: Wound exam; everything on the right lateral calf is closed. He still has erythema likely secondary to chronic stasis. His edema control is adequate certainly not tense Integumentary  (Hair, Skin) Wound #20 status is Open. Original cause of wound was Gradually Appeared. The wound is located on the Right,Circumferential Lower Leg. The wound measures 0cm length x 0cm width x 0cm depth; 0cm^2 area and 0cm^3 volume. There is Fat Layer (Subcutaneous Tissue) exposed. There is no tunneling or undermining noted. There is a large amount of serosanguineous drainage noted. The wound margin is distinct with the outline attached to the wound base. There is large (67-100%) red, pink granulation within the wound bed. There is a small (1-33%) amount of necrotic tissue within the wound bed including Adherent Slough. Assessment Active Problems ICD-10 Lymphedema, not elsewhere classified Non-pressure chronic ulcer of other part of right lower leg limited to breakdown of skin Cellulitis of right lower limb Plan Discharge From [facility] Services: Discharge from [place] Skin Barriers/Peri-Wound Care: Moisturizing lotion - to legs daily Wound Cleansing: May shower with protection. - with cast protectors Edema Control: Avoid standing for long periods of time Elevate legs to the level of the heart or above for 30 minutes daily and/or when sitting, a frequency of: - throughout the day. Exercise regularly Support Garment 30-40 mm/Hg pressure to: - Farrow wrap to both legs daily Segmental Compressive Device. - use lymphedema pumps twice a day an hour each time. Once in the morning and once in the evening. 1. The patient can be discharged 2. He has his Farrow wraps stockings and 6 stomach level external compression pumps. 3. I have advised him to watch the swelling in his legs if this increases to the point that his skin is tight he may have to use the compression pumps 3 times a day. 4. I discharge this man about a month ago. The recidivism here has been difficult. Electronic Signature(s) Signed: 04/26/2020 4:54:01 PM By:  Linton Ham MD Entered By: Linton Ham on 04/26/2020  15:22:26 -------------------------------------------------------------------------------- SuperBill Details Patient Name: Date of Service: Nicholas Junes DFO RD K. 04/26/2020 Medical Record Number: [id number] Patient Account Number: [id number] Date of Birth/Sex: Treating RN: 10/06/1945 (74 y.o. [name] Primary Care Provider: [name] Other Clinician: Referring Provider: Treating Provider/Extender: [name] in Treatment: 15 Diagnosis Coding ICD-10 Codes Code Description I89.0 Lymphedema, not elsewhere classified L97.811 Non-pressure chronic ulcer of other part of right lower leg limited to breakdown of skin L03.115 Cellulitis of right lower limb Facility Procedures CPT4 Code: 77034035 Description: 24818 - WOUND CARE VISIT-LEV 3 EST PT Modifier: Quantity: 1 Physician Procedures : CPT4 Code Description Modifier [id number] 21624 - WC PHYS LEVEL 2 - EST PT ICD-10 Diagnosis Description I89.0 Lymphedema, not elsewhere classified L97.811 Non-pressure chronic ulcer of other part of right lower leg limited to breakdown of skin Quantity: 1 Electronic Signature(s) Signed: 04/26/2020 4:54:01 PM By: Linton Ham MD Entered By: Linton Ham on 04/26/2020 15:22:48

## 2020-04-26 NOTE — Progress Notes (Signed)
RAHMAN FERRALL ([id number]) , Visit Report for 04/26/2020 Arrival Information Details Patient Name: Date of Service: [name] 04/26/2020 2:00 PM Medical Record Number: [id number] Patient Account Number: [id number] Date of Birth/Sex: Treating RN: 15-Aug-1945 (74 y.o. [name]) Carlene Coria Primary Care Cebert Dettmann: [name] Other Clinician: Referring Jerre Vandrunen: Treating Ronnald Shedden/Extender: [name] in Treatment: 15 Visit Information History Since Last Visit All ordered tests and consults were completed: No Patient Arrived: Wheel Chair Added or deleted any medications: No Arrival Time: 13:51 Any new allergies or adverse reactions: No Accompanied By: self Had a fall or experienced change in No Transfer Assistance: None activities of daily living that may affect Patient Identification Verified: Yes risk of falls: Secondary Verification Process Completed: Yes Signs or symptoms of abuse/neglect since last visito No Patient Requires Transmission-Based Precautions: No Hospitalized since last visit: No Patient Has Alerts: Yes Implantable device outside of the clinic excluding No Patient Alerts: ABI: [place] 07/2018 cellular tissue based products placed in the center since last visit: Has Dressing in Place as Prescribed: Yes Has Compression in Place as Prescribed: Yes Pain Present Now: No Electronic Signature(s) Signed: 04/26/2020 5:20:03 PM By: Carlene Coria RN Entered By: Carlene Coria on 04/26/2020 13:53:03 -------------------------------------------------------------------------------- Clinic Level of Care Assessment Details Patient Name: Date of Service: [name] 04/26/2020 2:00 PM Medical Record Number: [id number] Patient Account Number: [id number] Date of Birth/Sex: Treating RN: [birth year] (74 y.o. [name] Primary Care Asante Blanda: [name] Other Clinician: Referring Kery Haltiwanger: Treating Perle Brickhouse/Extender: [name] in Treatment: 15 Clinic Level of Care Assessment Items TOOL 4 Quantity Score []  - 0 Use when only an EandM is performed on FOLLOW-UP visit ASSESSMENTS - Nursing Assessment / Reassessment X- 1 10 Reassessment of Co-morbidities (includes updates in patient status) X- 1 5 Reassessment of Adherence to Treatment Plan ASSESSMENTS - Wound and Skin A ssessment / Reassessment X - Simple Wound Assessment / Reassessment - one wound 1 5 []  - 0 Complex Wound Assessment / Reassessment - multiple wounds []  - 0 Dermatologic / Skin Assessment (not related to wound area) ASSESSMENTS - Focused Assessment X- 1 5 Circumferential Edema Measurements - multi extremities []  - 0 Nutritional Assessment / Counseling / Intervention X- 1 5 Lower Extremity Assessment (monofilament, tuning fork, pulses) []  - 0 Peripheral Arterial Disease Assessment (using hand held doppler) ASSESSMENTS - Ostomy and/or Continence Assessment and Care []  - 0 Incontinence Assessment and Management []  - 0 Ostomy Care Assessment and Management (repouching, etc.) PROCESS - Coordination of Care X - Simple Patient / Family Education for ongoing care 1 15 []  - 0 Complex (extensive) Patient / Family Education for ongoing care X- 1 10 Staff obtains Programmer, systems, Records, T Results / Process Orders est []  - 0 Staff telephones HHA, Nursing Homes / Clarify orders / etc []  - 0 Routine Transfer to another Facility (non-emergent condition) []  - 0 Routine Hospital Admission (non-emergent condition) []  - 0 New Admissions / Biomedical engineer / Ordering NPWT Apligraf, etc. , []  - 0 Emergency Hospital Admission (emergent condition) X- 1 10 Simple Discharge Coordination []  - 0 Complex (extensive) Discharge Coordination PROCESS - Special Needs []  - 0 Pediatric / Minor Patient Management []  - 0 Isolation Patient Management []  - 0 Hearing / Language / Visual special needs []  - 0 Assessment of  Community assistance (transportation, D/C planning, etc.) []  - 0 Additional assistance / Altered mentation []  - 0 Support Surface(s) Assessment (bed, cushion,  seat, etc.) INTERVENTIONS - Wound Cleansing / Measurement X - Simple Wound Cleansing - one wound 1 5 []  - 0 Complex Wound Cleansing - multiple wounds X- 1 5 Wound Imaging (photographs - any number of wounds) []  - 0 Wound Tracing (instead of photographs) []  - 0 Simple Wound Measurement - one wound []  - 0 Complex Wound Measurement - multiple wounds INTERVENTIONS - Wound Dressings []  - 0 Small Wound Dressing one or multiple wounds []  - 0 Medium Wound Dressing one or multiple wounds []  - 0 Large Wound Dressing one or multiple wounds []  - 0 Application of Medications - topical []  - 0 Application of Medications - injection INTERVENTIONS - Miscellaneous []  - 0 External ear exam []  - 0 Specimen Collection (cultures, biopsies, blood, body fluids, etc.) []  - 0 Specimen(s) / Culture(s) sent or taken to Lab for analysis []  - 0 Patient Transfer (multiple staff / Civil Service fast streamer / Similar devices) []  - 0 Simple Staple / Suture removal (25 or less) []  - 0 Complex Staple / Suture removal (26 or more) []  - 0 Hypo / Hyperglycemic Management (close monitor of Blood Glucose) []  - 0 Ankle / Brachial Index (ABI) - do not check if billed separately X- 1 5 Vital Signs Has the patient been seen at the hospital within the last three years: Yes Total Score: 80 Level Of Care: New/Established - Level 3 Electronic Signature(s) Signed: 04/26/2020 5:03:51 PM By: Baruch Gouty RN, BSN Entered By: Baruch Gouty on 04/26/2020 14:15:00 -------------------------------------------------------------------------------- Encounter Discharge Information Details Patient Name: Date of Service: [name] 04/26/2020 2:00 PM Medical Record Number: [id number] Patient Account Number: [id number] Date of Birth/Sex: Treating RN: Feb 08, 1946 (74  y.o. [name] Diener Primary Care Katalyn Matin: [name] Other Clinician: Referring Myles Tavella: Treating Cobain Morici/Extender: [name] in Treatment: 15 Encounter Discharge Information Items Discharge Condition: Stable Ambulatory Status: Wheelchair Discharge Destination: Home Transportation: Private Auto Accompanied By: self Schedule Follow-up Appointment: No Clinical Summary of Care: Notes farrow wrap 4000 applied to right leg. explained the orders at length. patient in agreement. Electronic Signature(s) Signed: 04/26/2020 4:56:48 PM By: Deon Pilling Entered By: Deon Pilling on 04/26/2020 14:39:08 -------------------------------------------------------------------------------- Lower Extremity Assessment Details Patient Name: Date of Service: [name] 04/26/2020 2:00 PM Medical Record Number: [id number] Patient Account Number: [id number] Date of Birth/Sex: Treating RN: [birth year] (74 y.o. [name]) Carlene Coria Primary Care Chava Dulac: [name] Other Clinician: Referring Brandley Aldrete: Treating Zachary Nole/Extender: [name] in Treatment: 15 Edema Assessment Assessed: [name]: No] [name]: No] Edema: [Left: Ye] [Right: s] Calf Left: Right: Point of Measurement: 40 cm From Medial Instep 50 cm Ankle Left: Right: Point of Measurement: 19 cm From Medial Instep 37 cm Electronic Signature(s) Signed: 04/26/2020 5:20:03 PM By: Carlene Coria RN Entered By: Carlene Coria on 04/26/2020 13:55:36 -------------------------------------------------------------------------------- Multi Wound Chart Details Patient Name: Date of Service: [name] 04/26/2020 2:00 PM Medical Record Number: [id number] Patient Account Number: [id number] Date of Birth/Sex: Treating RN: 08-22-1945 (74 y.o. [name] Primary Care Ravleen Ries: [name] Other Clinician: Referring Jud Fanguy: Treating Jaaziel Peatross/Extender: [name] in Treatment: 15 Vital Signs Height(in): 2 Pulse(bpm): 69 Weight(lbs): 250 Blood Pressure(mmHg): 118/77 Body Mass Index(BMI): 39 Temperature(F): 98.4 Respiratory Rate(breaths/min): 20 Photos: [20:No Photos Right, Circumferential Lower Leg] [N/A:N/A N/A] Wound Location: [20:Gradually Appeared] [N/A:N/A] Wounding Event: [20:Lymphedema] [N/A:N/A] Primary Etiology: [20:Lymphedema, Sleep Apnea,] [N/A:N/A] Comorbid History: [20:Congestive Heart Failure, Hypertension, Peripheral Venous Disease, Received  Chemotherapy 04/08/2020] [N/A:N/A] Date Acquired: [20:2] [N/A:N/A] Weeks of Treatment: [20:Open] [N/A:N/A] Wound Status: [20:0x0x0] [N/A:N/A] Measurements L x W x D (cm) [20:0] [N/A:N/A] A (cm) : rea [20:0] [N/A:N/A] Volume (cm) : [20:100.00%] [N/A:N/A] % Reduction in Area: [20:100.00%] [N/A:N/A] % Reduction in Volume: [20:Full Thickness Without Exposed] [N/A:N/A] Classification: [20:Support Structures Large] [N/A:N/A] Exudate Amount: [20:Serosanguineous] [N/A:N/A] Exudate Type: [20:red, brown] [N/A:N/A] Exudate Color: [20:Distinct, outline attached] [N/A:N/A] Wound Margin: [20:Large (67-100%)] [N/A:N/A] Granulation Amount: [20:Red, Pink] [N/A:N/A] Granulation Quality: [20:Small (1-33%)] [N/A:N/A] Necrotic Amount: [20:Fat Layer (Subcutaneous Tissue): Yes N/A] Exposed Structures: [20:Fascia: No Tendon: No Muscle: No Joint: No Bone: No Small (1-33%)] [N/A:N/A] Treatment Notes Electronic Signature(s) Signed: 04/26/2020 4:54:01 PM By: Linton Ham MD Signed: 04/26/2020 5:03:51 PM By: Baruch Gouty RN, BSN Entered By: Linton Ham on 04/26/2020 15:16:13 -------------------------------------------------------------------------------- Multi-Disciplinary Care Plan Details Patient Name: Date of Service: [name] 04/26/2020 2:00 PM Medical Record Number: [id number] Patient Account Number: [id number] Date of Birth/Sex: Treating  RN: 19-Mar-1946 (74 y.o. [name] Primary Care Braxtyn Bojarski: [name] Other Clinician: Referring Javad Salva: Treating Adrian Specht/Extender: [name] in Treatment: 15 Active Inactive Electronic Signature(s) Signed: 04/26/2020 5:03:51 PM By: Baruch Gouty RN, BSN Entered By: Baruch Gouty on 04/26/2020 14:10:57 -------------------------------------------------------------------------------- Pain Assessment Details Patient Name: Date of Service: [name] 04/26/2020 2:00 PM Medical Record Number: [id number] Patient Account Number: [id number] Date of Birth/Sex: Treating RN: [birth year] (74 y.o. [name] Primary Care Kassi Esteve: [name] Other Clinician: Referring Freddie Dymek: Treating Bardia Wangerin/Extender: [name] in Treatment: 15 Active Problems Location of Pain Severity and Description of Pain Patient Has Paino No Site Locations Pain Management and Medication Current Pain Management: Electronic Signature(s) Signed: 04/26/2020 5:20:03 PM By: Carlene Coria RN Entered By: Carlene Coria on 04/26/2020 13:53:46 -------------------------------------------------------------------------------- Patient/Caregiver Education Details Patient Name: Date of Service: [name] 10/4/2021andnbsp2:00 PM Medical Record Number: [id number] Patient Account Number: [id number] Date of Birth/Gender: Treating RN: 24-Apr-1946 (74 y.o. [name] Primary Care Physician: [name] Other Clinician: Referring Physician: Treating Physician/Extender: [name] in Treatment: 15 Education Assessment Education Provided To: Patient Education Topics Provided Venous: Methods: Explain/Verbal Responses: Reinforcements needed, State content correctly Wound/Skin Impairment: Methods: Explain/Verbal Responses: Reinforcements needed, State content correctly Electronic  Signature(s) Signed: 04/26/2020 5:03:51 PM By: Baruch Gouty RN, BSN Entered By: Baruch Gouty on 04/26/2020 14:11:22 -------------------------------------------------------------------------------- Wound Assessment Details Patient Name: Date of Service: [name] 04/26/2020 2:00 PM Medical Record Number: [id number] Patient Account Number: [id number] Date of Birth/Sex: Treating RN: Jun 01, 1946 (74 y.o. [name]) Carlene Coria Primary Care Julio Storr: [name] Other Clinician: Referring Dezi Brauner: Treating Taela Charbonneau/Extender: [name] in Treatment: 15 Wound Status Wound Number: 20 Primary Lymphedema Etiology: Wound Location: Right, Circumferential Lower Leg Wound Open Wounding Event: Gradually Appeared Status: Date Acquired: 04/08/2020 Comorbid Lymphedema, Sleep Apnea, Congestive Heart Failure, Weeks Of Treatment: 2 History: Hypertension, Peripheral Venous Disease, Received Clustered Wound: No Chemotherapy Wound Measurements Length: (cm) Width: (cm) Depth: (cm) Area: (cm) Volume: (cm) 0 % Reduction in Area: 100% 0 % Reduction in Volume: 100% 0 Epithelialization: Small (1-33%) 0 Tunneling: No 0 Undermining: No Wound Description Classification: Full Thickness Without Exposed Support Structures Wound Margin: Distinct, outline attached Exudate Amount: Large Exudate Type: Serosanguineous Exudate Color: red, brown Wound Bed Granulation Amount: Large (67-100%) Granulation Quality: Red, Pink Necrotic Amount: Small (1-33%) Necrotic Quality: Adherent Slough Foul Odor After Cleansing: No Slough/Fibrino  Yes Exposed Structure Fascia Exposed: No Fat Layer (Subcutaneous Tissue) Exposed: Yes Tendon Exposed: No Muscle Exposed: No Joint Exposed: No Bone Exposed: No Electronic Signature(s) Signed: 04/26/2020 5:20:03 PM By: Carlene Coria RN Entered By: Carlene Coria on 04/26/2020  13:57:45 -------------------------------------------------------------------------------- [place] Details Patient Name: Date of Service: [name] 04/26/2020 2:00 PM Medical Record Number: [id number] Patient Account Number: [id number] Date of Birth/Sex: Treating RN: 22-Apr-1946 (74 y.o. [name]) Carlene Coria Primary Care Veralyn Lopp: [name] Other Clinician: Referring Sherrita Riederer: Treating Ambree Frances/Extender: [name] in Treatment: 15 Vital Signs Time Taken: 13:53 Temperature (F): 98.4 Height (in): 67 Pulse (bpm): 88 Weight (lbs): 250 Respiratory Rate (breaths/min): 20 Body Mass Index (BMI): 39.2 Blood Pressure (mmHg): 118/77 Reference Range: 80 - 120 mg / dl Electronic Signature(s) Signed: 04/26/2020 5:20:03 PM By: Carlene Coria RN Entered By: Carlene Coria on 04/26/2020 13:53:39

## 2020-04-28 ENCOUNTER — Encounter (HOSPITAL_BASED_OUTPATIENT_CLINIC_OR_DEPARTMENT_OTHER): Payer: PPO | Admitting: Physician Assistant

## 2020-04-28 NOTE — Telephone Encounter (Signed)
Lattie Haw can you look into this for me? Thanks TP

## 2020-05-14 ENCOUNTER — Ambulatory Visit (INDEPENDENT_AMBULATORY_CARE_PROVIDER_SITE_OTHER): Payer: PPO

## 2020-05-14 ENCOUNTER — Other Ambulatory Visit: Payer: Self-pay

## 2020-05-14 DIAGNOSIS — Z0000 Encounter for general adult medical examination without abnormal findings: Secondary | ICD-10-CM

## 2020-05-14 NOTE — Patient Instructions (Addendum)
[name] , Thank you for taking time to come for your Medicare Wellness Visit. I appreciate your ongoing commitment to your health goals. Please review the following plan we discussed and let me know if I can assist you in the future.   Screening recommendations/referrals: Colonoscopy: Done 08/26/19 Recommended yearly ophthalmology/optometry visit for glaucoma screening and checkup Recommended yearly dental visit for hygiene and checkup  Vaccinations: Influenza vaccine: Done 04/13/20 Pneumococcal vaccine: Up to date Tdap vaccine: Up to date Shingles vaccine: Shingrix discussed. Please contact your pharmacy for coverage information. Completed    Covid-19: Completed 08/14/19 & 09/04/19  Advanced directives: Please bring a copy of your health care power of attorney and living will to the office at your convenience.  Conditions/risks identified: None at this time Next appointment: Follow up in one year for your annual wellness visit.   Preventive Care 6 Years and Older, Male Preventive care refers to lifestyle choices and visits with your health care provider that can promote health and wellness. What does preventive care include?  A yearly physical exam. This is also called an annual well check.  Dental exams once or twice a year.  Routine eye exams. Ask your health care provider how often you should have your eyes checked.  Personal lifestyle choices, including:  Daily care of your teeth and gums.  Regular physical activity.  Eating a healthy diet.  Avoiding tobacco and drug use.  Limiting alcohol use.  Practicing safe sex.  Taking low doses of aspirin every day.  Taking vitamin and mineral supplements as recommended by your health care provider. What happens during an annual well check? The services and screenings done by your health care provider during your annual well check will depend on your age, overall health, lifestyle risk factors, and family history of  disease. Counseling  Your health care provider may ask you questions about your:  Alcohol use.  Tobacco use.  Drug use.  Emotional well-being.  Home and relationship well-being.  Sexual activity.  Eating habits.  History of falls.  Memory and ability to understand (cognition).  Work and work Statistician. Screening  You may have the following tests or measurements:  Height, weight, and BMI.  Blood pressure.  Lipid and cholesterol levels. These may be checked every 5 years, or more frequently if you are over 28 years old.  Skin check.  Lung cancer screening. You may have this screening every year starting at age 27 if you have a 30-pack-year history of smoking and currently smoke or have quit within the past 15 years.  Fecal occult blood test (FOBT) of the stool. You may have this test every year starting at age 5.  Flexible sigmoidoscopy or colonoscopy. You may have a sigmoidoscopy every 5 years or a colonoscopy every 10 years starting at age 80.  Prostate cancer screening. Recommendations will vary depending on your family history and other risks.  Hepatitis C blood test.  Hepatitis B blood test.  Sexually transmitted disease (STD) testing.  Diabetes screening. This is done by checking your blood sugar (glucose) after you have not eaten for a while (fasting). You may have this done every 1-3 years.  Abdominal aortic aneurysm (AAA) screening. You may need this if you are a current or former smoker.  Osteoporosis. You may be screened starting at age 9 if you are at high risk. Talk with your health care provider about your test results, treatment options, and if necessary, the need for more tests. Vaccines  Your health  care provider may recommend certain vaccines, such as:  Influenza vaccine. This is recommended every year.  Tetanus, diphtheria, and acellular pertussis (Tdap, Td) vaccine. You may need a Td booster every 10 years.  Zoster vaccine. You may  need this after age 61.  Pneumococcal 13-valent conjugate (PCV13) vaccine. One dose is recommended after age 59.  Pneumococcal polysaccharide (PPSV23) vaccine. One dose is recommended after age 8. Talk to your health care provider about which screenings and vaccines you need and how often you need them. This information is not intended to replace advice given to you by your health care provider. Make sure you discuss any questions you have with your health care provider. Document Released: 08/06/2015 Document Revised: 03/29/2016 Document Reviewed: 05/11/2015 Elsevier Interactive Patient Education  2017 [employer] Prevention in the Home Falls can cause injuries. They can happen to people of all ages. There are many things you can do to make your home safe and to help prevent falls. What can I do on the outside of my home?  Regularly fix the edges of walkways and driveways and fix any cracks.  Remove anything that might make you trip as you walk through a door, such as a raised step or threshold.  Trim any bushes or trees on the path to your home.  Use bright outdoor lighting.  Clear any walking paths of anything that might make someone trip, such as rocks or tools.  Regularly check to see if handrails are loose or broken. Make sure that both sides of any steps have handrails.  Any raised decks and porches should have guardrails on the edges.  Have any leaves, snow, or ice cleared regularly.  Use sand or salt on walking paths during winter.  Clean up any spills in your garage right away. This includes oil or grease spills. What can I do in the bathroom?  Use night lights.  Install grab bars by the toilet and in the tub and shower. Do not use towel bars as grab bars.  Use non-skid mats or decals in the tub or shower.  If you need to sit down in the shower, use a plastic, non-slip stool.  Keep the floor dry. Clean up any water that spills on the floor as soon as it  happens.  Remove soap buildup in the tub or shower regularly.  Attach bath mats securely with double-sided non-slip rug tape.  Do not have throw rugs and other things on the floor that can make you trip. What can I do in the bedroom?  Use night lights.  Make sure that you have a light by your bed that is easy to reach.  Do not use any sheets or blankets that are too big for your bed. They should not hang down onto the floor.  Have a firm chair that has side arms. You can use this for support while you get dressed.  Do not have throw rugs and other things on the floor that can make you trip. What can I do in the kitchen?  Clean up any spills right away.  Avoid walking on wet floors.  Keep items that you use a lot in easy-to-reach places.  If you need to reach something above you, use a strong step stool that has a grab bar.  Keep electrical cords out of the way.  Do not use floor polish or wax that makes floors slippery. If you must use wax, use non-skid floor wax.  Do not have  throw rugs and other things on the floor that can make you trip. What can I do with my stairs?  Do not leave any items on the stairs.  Make sure that there are handrails on both sides of the stairs and use them. Fix handrails that are broken or loose. Make sure that handrails are as long as the stairways.  Check any carpeting to make sure that it is firmly attached to the stairs. Fix any carpet that is loose or worn.  Avoid having throw rugs at the top or bottom of the stairs. If you do have throw rugs, attach them to the floor with carpet tape.  Make sure that you have a light switch at the top of the stairs and the bottom of the stairs. If you do not have them, ask someone to add them for you. What else can I do to help prevent falls?  Wear shoes that:  Do not have high heels.  Have rubber bottoms.  Are comfortable and fit you well.  Are closed at the toe. Do not wear sandals.  If you  use a stepladder:  Make sure that it is fully opened. Do not climb a closed stepladder.  Make sure that both sides of the stepladder are locked into place.  Ask someone to hold it for you, if possible.  Clearly mark and make sure that you can see:  Any grab bars or handrails.  First and last steps.  Where the edge of each step is.  Use tools that help you move around (mobility aids) if they are needed. These include:  Canes.  Walkers.  Scooters.  Crutches.  Turn on the lights when you go into a dark area. Replace any light bulbs as soon as they burn out.  Set up your furniture so you have a clear path. Avoid moving your furniture around.  If any of your floors are uneven, fix them.  If there are any pets around you, be aware of where they are.  Review your medicines with your doctor. Some medicines can make you feel dizzy. This can increase your chance of falling. Ask your doctor what other things that you can do to help prevent falls. This information is not intended to replace advice given to you by your health care provider. Make sure you discuss any questions you have with your health care provider. Document Released: 05/06/2009 Document Revised: 12/16/2015 Document Reviewed: 08/14/2014 Elsevier Interactive Patient Education  2017 [employer].

## 2020-05-14 NOTE — Progress Notes (Signed)
Virtual Visit via Telephone Note  I connected with  Saburo Luger Kuhar on 05/14/20 at  2:30 PM EDT by telephone and verified that I am speaking with the correct person using two identifiers.  Medicare Annual Wellness visit completed telephonically due to Covid-19 pandemic.   Persons participating in this call: This Health Coach and this patient.   Location: Patient: Home Provider: Office   I discussed the limitations, risks, security and privacy concerns of performing an evaluation and management service by telephone and the availability of in person appointments. The patient expressed understanding and agreed to proceed.  Unable to perform video visit due to video visit attempted and failed and/or patient does not have video capability.   Some vital signs may be absent or patient reported.   Willette Brace, LPN    Subjective:   ZEVEN KOCAK is a 74 y.o. male who presents for Medicare Annual/Subsequent preventive examination.  Review of Systems     Cardiac Risk Factors include: hypertension;male gender;obesity (BMI >30kg/m2);dyslipidemia     Objective:    There were no vitals filed for this visit. There is no height or weight on file to calculate BMI.  Advanced Directives 05/14/2020 04/12/2020 04/12/2020 12/12/2019 05/14/2019 04/14/2019 11/28/2018  Does Patient Have a Medical Advance Directive? Yes Yes Yes Yes Yes Yes Yes  Type of Paramedic of [place];Living will Healthcare Power of [place];Living will [place];Living will Healthcare Power of [place] of [place];Living will  Does patient want to make changes to medical advance directive? - No - Patient declined - - No - Patient declined No - Patient declined -  Copy of [place] in Chart? No - copy requested No - copy requested - - No - copy requested No - copy requested -  Would patient  like information on creating a medical advance directive? - - - - - - -    Current Medications (verified) Outpatient Encounter Medications as of 05/14/2020  Medication Sig  . carbidopa-levodopa (SINEMET IR) 25-100 MG tablet TAKE 2 TABLETS AT 7AM,11AM, 3PM, AND AT 7PM. (Patient taking differently: Take 2 tablets by mouth 4 (four) times daily. TAKE 2 TABLETS AT 7AM,11AM, 3PM, AND AT 7PM.)  . rosuvastatin (CRESTOR) 10 MG tablet TAKE 1 TABLET ONCE DAILY.  . [DISCONTINUED] Multiple Vitamins-Minerals (MULTIVITAMIN ADULTS 50+) TABS Take 1 tablet by mouth in the morning and at bedtime. (Patient not taking: Reported on 05/14/2020)   No facility-administered encounter medications on file as of 05/14/2020.    Allergies (verified) Amoxicillin   History: Past Medical History:  Diagnosis Date  . Cancer 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 Hyperlipidemia   . Hypertension   . Lymphedema    Past Surgical History:  Procedure Laterality Date  . 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 removal of melanoma of left foot/amputation of toes 1&25 Jul 2006  . WRIST FRACTURE SURGERY     74 years old-set   Family History  Problem Relation Age of Onset  . Hypertension Father   . CVA Father        age 22  . Hyperlipidemia Father   . Other Mother        Deceased  . Healthy Sister   . Healthy Brother    Social History   Socioeconomic History  . Marital status: Married  Spouse name: Not on file  . Number of children: Not on file  . Years of education: Not on file  . Highest education level: Not on file  Occupational History  . Occupation: retired    Comment: Engineer, maintenance (IT)  Tobacco Use  . Smoking status: Former Smoker    Packs/day: 1.00    Years: 16.00    Pack years: 16.00    Types: Cigarettes    Quit date: 12/22/1993    Years since quitting: 26.4  . Smokeless tobacco: Never Used  Vaping Use  . Vaping Use: Never used  Substance and Sexual Activity  . Alcohol  use: No    Alcohol/week: 21.0 standard drinks    Types: 21 Standard drinks or equivalent per week  . Drug use: No  . Sexual activity: Not on file  Other Topics Concern  . Not on file  Social History Narrative   Married 1981. Wife has kids-1 with 1 adopted grandchild.       Retired Tax adviser      Highest level of education:  B.S.      Hobbies: antiques, former Air cabin crew      Exercise: none currently.    Social Determinants of Health   Financial Resource Strain: Low Risk   . Difficulty of Paying Living Expenses: Not hard at all  Food Insecurity: No Food Insecurity  . Worried About Charity fundraiser in the Last Year: Never true  . Ran Out of Food in the Last Year: Never true  Transportation Needs: No Transportation Needs  . Lack of Transportation (Medical): No  . Lack of Transportation (Non-Medical): No  Physical Activity: Inactive  . Days of Exercise per Week: 0 days  . Minutes of Exercise per Session: 0 min  Stress: No Stress Concern Present  . Feeling of Stress : Not at all  Social Connections: Moderately Integrated  . Frequency of Communication with Friends and Family: More than three times a week  . Frequency of Social Gatherings with Friends and Family: More than three times a week  . Attends Religious Services: Never  . Active Member of Clubs or Organizations: Yes  . Attends Archivist Meetings: 1 to 4 times per year  . Marital Status: Married    Tobacco Counseling Counseling given: Not Answered   Clinical Intake:  Pre-visit preparation completed: Yes        BMI - recorded: 39.15 Nutritional Status: BMI > 30  Obese Nutritional Risks: None Diabetes: No  How often do you need to have someone help you when you read instructions, pamphlets, or other written materials from your doctor or pharmacy?: 1 - Never  Diabetic?No  Interpreter Needed?: No  Information entered by :: Charlott Rakes, LPN   Activities of Daily Living In your present  state of health, do you have any difficulty performing the following activities: 05/14/2020 04/12/2020  Hearing? N N  Vision? N N  Difficulty concentrating or making decisions? N N  Walking or climbing stairs? - Y  Dressing or bathing? N N  Doing errands, shopping? N -  Preparing Food and eating ? N -  Using the Toilet? N -  In the past six months, have you accidently leaked urine? Y -  Comment wears a brief -  Do you have problems with loss of bowel control? Y -  Comment Wears a brief -  Managing your Medications? N -  Managing your Finances? N -  Housekeeping or managing your Housekeeping? N -  Some recent  data might be hidden    Patient Care Team: [name], MD as PCP - General (Family Medicine) [name], MD as Consulting Physician (Dermatology) [name], MD as Consulting Physician (Ophthalmology) [name], MD as Consulting Physician (Orthopedic Surgery) [name], DDS as Referring Physician (Dentistry) [name], PT as Physical Therapist (Physical Therapy) [name], [name], DO as Consulting Physician (Neurology) [name] as Consulting Physician (Optometry) [facility] [place], MD as Attending Physician Kathie Rhodes, MD as Consulting Physician (Urology)  Indicate any recent Medical Services you may have received from other than [facility] providers in the past year (date may be approximate).     Assessment:   This is a routine wellness examination for [facility].  Hearing/Vision screen  Hearing Screening   125Hz  250Hz  500Hz  1000Hz  2000Hz  3000Hz  4000Hz  6000Hz  8000Hz   Right ear:           Left ear:           Comments: No difficulty hearing at this time  Vision Screening Comments: Pt follows up annually with Dr Alois Cliche  Dietary issues and exercise activities discussed: Current Exercise Habits: The patient does not participate in regular exercise at present, Exercise limited by: Other - see comments (uses walker, cane and wheel  chair)  Goals    . Maintain current health status.     . Patient Stated     None at this time      Depression Screen PHQ 2/9 Scores 05/14/2020 12/12/2019 10/03/2019 04/14/2019 01/07/2019 06/19/2018 02/07/2017  PHQ - 2 Score 0 0 0 0 0 0 0  PHQ- 9 Score - - 0 - 0 - -    Fall Risk Fall Risk  05/14/2020 01/01/2020 12/12/2019 04/14/2019 10/25/2018  Falls in the past year? 0 0 0 1 1  Number falls in past yr: 1 0 0 1 0  Injury with Fall? 0 0 0 0 1  Risk Factor Category  - - - - -  Risk for fall due to : History of fall(s);Impaired mobility;Impaired balance/gait - - Impaired mobility;Impaired balance/gait -  Follow up Falls prevention discussed - - Education provided;Falls prevention discussed;Falls evaluation completed Falls evaluation completed    Any stairs in or around the home? No  If so, are there any without handrails? No  Home free of loose throw rugs in walkways, pet beds, electrical cords, etc? Yes  Adequate lighting in your home to reduce risk of falls? Yes   ASSISTIVE DEVICES UTILIZED TO PREVENT FALLS:  Life alert? Yes  cord on wall for emergency Use of a cane, walker or w/c? Yes  Grab bars in the bathroom? Yes  Shower chair or bench in shower? Yes  Elevated toilet seat or a handicapped toilet? Yes   TIMED UP AND GO:  Was the test performed? No .    Cognitive Function:     6CIT Screen 05/14/2020 04/14/2019  What Year? 0 points 0 points  What month? 0 points 0 points  What time? - 0 points  Count back from 20 0 points 0 points  Months in reverse 0 points 0 points  Repeat phrase 0 points 0 points  Total Score - 0    Immunizations Immunization History  Administered Date(s) Administered  . Fluad Quad(high Dose 65+) 04/14/2019, 04/13/2020  . Influenza Split 04/24/2011, 04/24/2012  . Influenza Whole 05/19/2008, 04/15/2010  . Influenza, High Dose Seasonal PF 05/19/2013, 05/03/2016, 05/07/2017, 05/07/2018  . Influenza,inj,Quad PF,6+ Mos 04/29/2014, 05/04/2015  . PFIZER  SARS-COV-2 Vaccination  08/14/2019, 09/04/2019  . Pneumococcal Conjugate-13 12/30/2014  . Pneumococcal Polysaccharide-23 04/24/2011  . Td 07/24/2001  . Tdap 08/02/2012  . Zoster 04/24/2011    TDAP status: Up to date Flu Vaccine status: Up to date Pneumococcal vaccine status: Up to date Covid-19 vaccine status: Completed vaccines  Qualifies for Shingles Vaccine? Yes   Zostavax completed No   Shingrix Completed?: No.    Education has been provided regarding the importance of this vaccine. Patient has been advised to call insurance company to determine out of pocket expense if they have not yet received this vaccine. Advised may also receive vaccine at local pharmacy or Health Dept. Verbalized acceptance and understanding.  Screening Tests Health Maintenance  Topic Date Due  . Hepatitis C Screening  07/21/2098 (Originally May 31, 1946)  . TETANUS/TDAP  08/02/2022  . Fecal DNA (Cologuard)  08/25/2022  . INFLUENZA VACCINE  Completed  . COVID-19 Vaccine  Completed  . PNA vac Low Risk Adult  Completed    Health Maintenance  There are no preventive care reminders to display for this patient.  Colorectal cancer screening 08/26/19 next 3 years 08/25/22    Additional Screening:  Hepatitis C Screening: does qualify;   Vision Screening: Recommended annual ophthalmology exams for early detection of glaucoma and other disorders of the eye. Is the patient up to date with their annual eye exam?  Yes  Who is the provider or what is the name of the office in which the patient attends annual eye exams? Dr Alois Cliche If pt is not established with a provider, would they like to be referred to a provider to establish care?   Dental Screening: Recommended annual dental exams for proper oral hygiene  Community Resource Referral / Chronic Care Management: CRR required this visit?  No   CCM required this visit?  No      Plan:     I have personally reviewed and noted the following in the  patient's chart:   . Medical and social history . Use of alcohol, tobacco or illicit drugs  . Current medications and supplements . Functional ability and status . Nutritional status . Physical activity . Advanced directives . List of other physicians . Hospitalizations, surgeries, and ER visits in previous 12 months . Vitals . Screenings to include cognitive, depression, and falls . Referrals and appointments  In addition, I have reviewed and discussed with patient certain preventive protocols, quality metrics, and best practice recommendations. A written personalized care plan for preventive services as well as general preventive health recommendations were provided to patient.     Willette Brace, LPN   [id number]   Nurse Notes: None

## 2020-05-21 ENCOUNTER — Ambulatory Visit: Payer: PPO | Attending: Family Medicine | Admitting: Physical Therapy

## 2020-05-21 ENCOUNTER — Other Ambulatory Visit: Payer: Self-pay

## 2020-05-21 ENCOUNTER — Encounter: Payer: Self-pay | Admitting: Physical Therapy

## 2020-05-21 ENCOUNTER — Telehealth: Payer: Self-pay | Admitting: Physical Therapy

## 2020-05-21 DIAGNOSIS — R29818 Other symptoms and signs involving the nervous system: Secondary | ICD-10-CM | POA: Diagnosis not present

## 2020-05-21 DIAGNOSIS — M6281 Muscle weakness (generalized): Secondary | ICD-10-CM | POA: Insufficient documentation

## 2020-05-21 DIAGNOSIS — R2681 Unsteadiness on feet: Secondary | ICD-10-CM | POA: Insufficient documentation

## 2020-05-21 DIAGNOSIS — R2689 Other abnormalities of gait and mobility: Secondary | ICD-10-CM | POA: Insufficient documentation

## 2020-05-21 NOTE — Therapy (Signed)
[facility] [address] [place], [place], [address] Phone: [phone]   Fax:  [phone]  Physical Therapy Evaluation  Patient Details  Name: Nicholas [name] MRN: [id number] Date of Birth: 18-Apr-1946 Referring Provider (PT): [name], DO   Encounter Date: 05/21/2020   PT End of Session - 05/21/20 1805    Visit Number 1    Number of Visits 9    Date for PT Re-Evaluation 07/20/20    Authorization Type HTA, VL:  [place] No auth    Progress Note Due on Visit 10    PT Start Time 1102    PT Stop Time 1147    PT Time Calculation (min) 45 min    Equipment Utilized During Treatment Gait belt    Activity Tolerance Patient tolerated treatment well    Behavior During Therapy [facility] for tasks assessed/performed           Past Medical History:  Diagnosis Date  . 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 Hyperlipidemia   . Hypertension   . Lymphedema     Past Surgical History:  Procedure Laterality Date  . 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 removal of melanoma of left foot/amputation of toes 1&25 Jul 2006  . WRIST FRACTURE SURGERY     74 years old-set    There were no vitals filed for this visit.    Subjective Assessment - 05/21/20 1110    Subjective Pt reports moving to [place] in the past month.  Lymphedema has turned to cellulitis and being followed by wound center; using the compression machine every day, twice a day.  Have a power w/c coming soon.  Would like to help with getting back into pool.  Can walk with the walker-not by myself for short distance.  Has a lift chair at home.  From w/c, caregiver assists 25% to stand.    Patient is accompained by: --   caregiver, Gerald Stabs   Pertinent History PMH melanoma s/p amputation toes on L foot, HTN, hyperlipidemia, lymphedema    Limitations Walking;Standing;House hold activities    Patient Stated Goals Pt's goals for  therapy are to get back in the pool for exercise    Currently in Pain? No/denies              [facility] PT Assessment - 05/21/20 1120      Assessment   Medical Diagnosis Parkinson's Plus Syndrome    Referring Provider (PT) [name], DO    Onset Date/Surgical Date 04/07/20   PT referral   Hand Dominance Right      Precautions   Precautions Fall    Precaution Comments Wearing bandages for lymphedema/cellulitis (being followed by wound center), uses compression machine      Balance Screen   Has the patient fallen in the past 6 months Yes    How many times? at least 6-7    Has the patient had a decrease in activity level because of a fear of falling?  Yes    Is the patient reluctant to leave their home because of a fear of falling?  No      Home Environment   Living Environment Private residence    Living Arrangements Spouse/significant other    Available Help at Discharge Family;Personal care attendant   3x/wk   Type of [place] living facility   Recent move to cottage at [employer]  Home Access Level entry    Home Layout One level    Home Equipment Wheelchair - manual;Walker - 4 wheels;Tub bench;Grab bars - toilet;Grab bars - tub/shower    Additional Comments Power w/c assessment completed at [facility] facility earlier this year, pt still awaiting power w/c      Prior Function   Level of Independence Needs assistance with ADLs;Needs assistance with gait;Needs assistance with transfers    Comments Enjoys being out, in the community      Observation/Other Assessments   Focus on Therapeutic Outcomes (FOTO)  NA      Posture/Postural Control   Posture/Postural Control Postural limitations    Postural Limitations Forward head;Rounded Shoulders    Posture Comments Leans head to the right       ROM / Strength   AROM / PROM / Strength Strength;AROM      AROM   Overall AROM  Deficits    Overall AROM Comments Difficulty with full ROM due to lower extremity  lymphedema      Strength   Overall Strength Deficits    Strength Assessment Site Hip;Knee;Ankle    Right/Left Hip Right;Left    Right Hip Flexion 3+/5    Left Hip Flexion 4/5    Right/Left Knee Right;Left    Right Knee Flexion 4/5    Right Knee Extension 3/5    Left Knee Flexion 3/5    Left Knee Extension 4/5    Right/Left Ankle Right;Left    Right Ankle Dorsiflexion 3+/5    Left Ankle Dorsiflexion 3+/5      Transfers   Transfers Sit to Stand;Stand to Sit    Sit to Stand 4: Min assist;With upper extremity assist;From chair/3-in-1    Sit to Stand Details Verbal cues for sequencing;Verbal cues for technique   Cues for hand placement   Sit to Stand Details (indicate cue type and reason) Pt tends to try to push up from rollator/counter.  Has posterior lean with transfer    Stand to Sit 4: Min assist;With upper extremity assist;To chair/3-in-1      Ambulation/Gait   Ambulation/Gait Yes    Ambulation/Gait Assistance 4: Min assist    Ambulation/Gait Assistance Details Pt slow to negotiation doorway and turning to change directions, with episodes of festination.  On straight path with gait, pt hastens, speeds up gait, needing assist to keep walker at safe, close distance.    Ambulation Distance (Feet) 30 Feet   then 60   Assistive device Rollator    Gait Pattern Step-through pattern;Decreased step length - right;Decreased step length - left;Decreased dorsiflexion - right;Decreased dorsiflexion - left;Right foot flat;Left foot flat;Lateral trunk lean to right;Lateral trunk lean to left;Festinating;Shuffle    Ambulation Surface Level;Indoor    Gait velocity 16.74 sec over 20 ft = 1.19 ft/sec                      Objective measurements completed on examination: See above findings.               PT Education - 05/21/20 1804    Education Details PT eval results, POC    Person(s) Educated Patient;Caregiver(s)    Methods Explanation    Comprehension Verbalized  understanding            PT Short Term Goals - 05/21/20 1816      PT SHORT TERM GOAL #1   Title Pt will be HEP with caregiver/family assist for improved strength, balance, transfers and gait.  TARGET 06/18/2020    Time 4    Period Weeks    Status New      PT SHORT TERM GOAL #2   Title Pt will perform 4 of 5 transfers sit<>stand with min guard assist and no posterior lean, for improved ease and safety of trasnfers.    Baseline posterior lean, min assist and extra time    Time 4    Period Weeks    Status New      PT SHORT TERM GOAL #3   Title Pt and caregiver will verbalize understanding of tips to reduce freezing and festination with short distance household gait, including doorways and turning.    Time 4    Period Weeks    Status New      PT SHORT TERM GOAL #4   Title TUG score to be assessed and goal to be written as approrpiate.    Time 4    Period Weeks    Status New             PT Long Term Goals - 05/21/20 1819      PT LONG TERM GOAL #1   Title Pt will perform updated HEP to address strength, balance, transfers, gait for improved mobility and decreased fall risk.  TARGET 07/16/2020    Time 8    Period Weeks      PT LONG TERM GOAL #2   Title Pt will perform 8 of 10 reps of sit<>stand transfers with supervision, no posterior lean or LOB, for improved ease and safety with transfers.    Time 8    Period Weeks    Status New      PT LONG TERM GOAL #3   Title Pt will improve gait velocity to at least 1.6 ft/sec for decreased fall risk with household gait.    Time 8    Period Weeks    Status New      PT LONG TERM GOAL #4   Title Pt will verbalize plans for ongoing community fitness, including possible aquatic exercise as able to tolerate iwth lymphedema/wound hx, to maximize gains made in therapy.    Time 8    Period Weeks                  Plan - 05/21/20 1807    Clinical Impression Statement Pt is a 74 year old male who presents to OPPT with hx  of Parkinson's plus syndrome, at least 6-7 falls in past 6 months.  He desires therapy for exercise and possible return to aquatic therapy exercise, as it has helped him in the past.  He notes overall functional decline since beginning of COVID, with continued falls due to freezing and fesintation of giat.  He presents with decreased strength, decreased balance, decreased independence with gait, bradykinesia, postrual instability with transfers (posterior lean noted), decreased independence with transfers, decreased safety awareness.  He is at fall risk per gait velocity measures.  He is not a current candidate for aquatic therapy, as he has open wounds from cellulitis/lymphedema on RLE, being managed by wound center.  However, he is not currently performing exercises, he has had a recent move, and he has a new caregiver.  Pt and caregiver/family would bneeift from skilled PT to address the above stated deficits, with particular education to patient and caregiver on transfer training, HEP and ways to prevent/reduce future falls.    Personal Factors and Comorbidities Comorbidity 3+    Comorbidities PMH melanoma s/p  amputation toes on L foot, HTN, hyperlipidemia, lymphedema    Examination-Activity Limitations Locomotion Level;Transfers;Stand    Examination-Participation Restrictions Community Activity;Other   Community exercise (PD spin class)   Stability/Clinical Decision Making Evolving/Moderate complexity    Clinical Decision Making Moderate    Rehab Potential Good    PT Frequency 1x / week   may need to recert/increase frequency when/if pt becomes candidate for aquatic therapy   PT Duration 8 weeks   plus eval   PT Treatment/Interventions ADLs/Self Care Home Management;Aquatic Therapy;Gait training;Functional mobility training;Therapeutic activities;Therapeutic exercise;Balance training;Neuromuscular re-education;Patient/family education    PT Next Visit Plan Complete TUG and write goal; Initiate  HEP-seated exercises for lower extremity flexibility, strength; transfer training with safe, optimal technique for transfers; tips to reduce freezing with gait; can pt perform standing march/kicks at locked rollator to work on foot clearance (pt did not wear shoes due to lower extremity bandages, so I used shoe covers for his feet in clinic)    Recommended Other Services OT and speech (fine motor/handwriting/voice volume)    Consulted and Agree with Plan of Care Patient;Family member/caregiver    Family Member [employer], caregiver           Patient will benefit from skilled therapeutic intervention in order to improve the following deficits and impairments:  Abnormal gait, Difficulty walking, Decreased safety awareness, Decreased balance, Decreased mobility, Decreased strength, Postural dysfunction  Visit Diagnosis: Unsteadiness on feet  Other abnormalities of gait and mobility  Muscle weakness (generalized)  Other symptoms and signs involving the nervous system     Problem List Patient Active Problem List   Diagnosis Date Noted  . Cellulitis 04/12/2020  . Strain of right biceps 09/12/2017  . Lymphedema 02/10/2017  . BPH associated with nocturia 05/16/2016  . Senile purpura ([place]) 05/03/2016  . OSA (obstructive sleep apnea) 12/28/2015  . Hypersomnia 11/15/2015  . Cough 11/15/2015  . Parkinson's plus syndrome ([place]) 06/22/2015  . Dizziness 12/30/2014  . Diastolic dysfunction 09/81/1914  . Former smoker 04/29/2014  . Chronic venous insufficiency 02/20/2014  . Hyperglycemia 08/02/2012  . Morbid obesity ([place]) 07/13/2010  . History of malignant melanoma. left leg 03/09/2009  . Insomnia 10/03/2007  . Hyperlipidemia 12/13/2006  . Essential hypertension 12/13/2006    [name]. 05/21/2020, 6:24 PM [name]., PT  [facility] [address] [place], [place], [address] Phone: [phone]   Fax:   [phone]  Name: TOREN TUCHOLSKI MRN: [id number] Date of Birth: 05/11/1946

## 2020-05-21 NOTE — Telephone Encounter (Signed)
Are these home health requests? Or ambulatory? I have not previously referred to outpatient OT and SLP- do you know how to order?  [name]

## 2020-05-21 NOTE — Telephone Encounter (Signed)
Hello,  I evaluated [name] today for physical therapy.  He reports difficulty with voice volume and increased difficulty with fine motor control with handwriting/slowed ADLs.  He is agreeable to referral for speech therapy and occupational therapy.  If you agree, could you please send orders for speech therapy and OT via Epic?  Once received, we will schedule those evaluations with the patient.  Thank you.  [name], PT 05/21/20 6:29 PM Phone: [phone] Fax: [phone]

## 2020-05-24 ENCOUNTER — Other Ambulatory Visit: Payer: Self-pay

## 2020-05-24 ENCOUNTER — Encounter (HOSPITAL_BASED_OUTPATIENT_CLINIC_OR_DEPARTMENT_OTHER): Payer: PPO | Attending: Internal Medicine | Admitting: Internal Medicine

## 2020-05-24 DIAGNOSIS — L97811 Non-pressure chronic ulcer of other part of right lower leg limited to breakdown of skin: Secondary | ICD-10-CM | POA: Insufficient documentation

## 2020-05-24 DIAGNOSIS — Z85820 Personal history of malignant melanoma of skin: Secondary | ICD-10-CM | POA: Insufficient documentation

## 2020-05-24 DIAGNOSIS — Z89422 Acquired absence of other left toe(s): Secondary | ICD-10-CM | POA: Diagnosis not present

## 2020-05-24 DIAGNOSIS — G20 Parkinson's disease: Secondary | ICD-10-CM | POA: Insufficient documentation

## 2020-05-24 DIAGNOSIS — I890 Lymphedema, not elsewhere classified: Secondary | ICD-10-CM | POA: Insufficient documentation

## 2020-05-24 NOTE — Telephone Encounter (Signed)
Yes, that would be an ambulatory referral to [facility] as well, for occupational therapy and speech therapy.  Thank you.  [name], PT 05/24/20 7:22 AM Phone: [phone] Fax: [phone]

## 2020-05-25 ENCOUNTER — Other Ambulatory Visit: Payer: Self-pay

## 2020-05-25 DIAGNOSIS — G232 Striatonigral degeneration: Secondary | ICD-10-CM

## 2020-05-25 NOTE — Telephone Encounter (Signed)
Referral has been placed. 

## 2020-05-25 NOTE — Telephone Encounter (Signed)
Team you may refer under parkinsons plus syndrome to OT and SLP as per below

## 2020-05-25 NOTE — Progress Notes (Signed)
[name] ([id number]) , Visit Report for 05/24/2020 HPI Details Patient Name: Date of Service: Nicholas Junes DFO RD K. 05/24/2020 3:30 PM Medical Record Number: [id number] Patient Account Number: [id number] Date of Birth/Sex: Treating RN: 1946/04/10 (74 y.o. [name] Primary Care Provider: [name] Other Clinician: Referring Provider: Treating Provider/Extender: [name] in Treatment: 19 History of Present Illness HPI Description: ADMISSION 07/04/2018 This is a 74 year old man who has bilateral lymphedema felt to be secondary to previous treatment for malignant melanoma on his left toes. 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 Malignant melanoma was treated with immunotherapy. He is not had radiation. Sometime after this he developed edema in the left leg that spread into the right leg. He has bilateral compression pumps at home and he uses them twice a day and claims to be quite compliant. He also has a juxta lite type stocking at home which is apparently 63-year-old. He states that he fell in early October dislocating and fracturing his left shoulder. He slept in a recliner with his legs dependent. He was seen and followed at the rehab clinic at [facility] and was having lymphedema wraps done by 1 of the PT who noted his open wounds last week and he has been referred here for management. The patient has 2 open wounds on the right s distal lower leg and one on the left anterior lower leg The patient is not a diabetic. 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 For one reason or another we could not get him set up with home health and he kept the wraps on all week which he generally seem to tolerate. He did not use his compression pumps over the top of the wraps which I told him he could do this week. He comes in with a new rash on both feet which looks like tinea 07/22/2018; much better edema control bilaterally. The wounds on the right lateral calf and left anterior lower leg both look a lot better. There is epithelialization over both surfaces but I think this is fragile and I am going to put him in our compression wraps for another week. He says he is been using his compression pumps at home. He has a constellation of lower extremity compression garments that he is going to bring in next week. In discussion with him I cannot really get a sense of everything he has. I think he was at one point prescribed 30-40 below-knee stockings but he points out he could not get them on. 07/29/2018; the patient arrives in his legs are healed. He has a constellation of stockings none of which I think are going to be that helpful except for the fact that he has external wraparound stockings of some description. He has above-knee stockings but I cannot imagine it would be possible to get these on READMISSION 01/08/2020 Patient is now a 74 year old man. We had him for a short period from December 2019 through January 2020. He has bilateral lower extremity lymphedema which he initially traces to treatment for malignant melanoma 7 years ago. He has been followed at the lymphedema clinic on  [address] and recently has compression pumps that go up to his mid abdomen. He recently developed superficial wounds bilaterally. They will not manage these in the lymphedema clinic where the services are run by therapy. He has not been using his compression pumps. His significant other cannot get the stockings on his legs and the patient is only limited ability to help because  of Parkinson's disease Past medical history is essentially unchanged. He has sleep apnea, Parkinson's disease at some point labeled his Parkinson's plus, bilateral lymphedema, history of malignant melanoma. He does not have an arterial issue that I am aware of although his previous arterial studies have been noncompressible 7/1; patient we admitted the clinic 2 weeks ago. He has severe bilateral lymphedema. He had wounds predominantly on the left leg but also superficially on the right. He has compression pumps which he says he is using once a day. 7/15; 2-week follow-up. He comes in for nurse visit. He has severe bilateral lymphedema. He has upper abdominal level compression pumps which she is using twice a day. We have been wrapping both his lower legs the areas on the right leg are healed. He still has an area of left upper lateral and right anterior medial. Anterior medial wound is small. He is tolerating this well. 7/22; still significant lymphedema even with 4-layer compression and external compression pump usage. He said he had some itching on the right anterior leg he comes in with a large superficial area of skin breakdown anteriorly and a smaller area laterally on the right leg. He also has a rash on the left dorsal ankle crease. Some of these look pustular. I wonder if this is an occlusion issue from foam we put down here. He also states the only thing different this week is the did a 5-hour car ride with his legs dependent. There was nothing open on the right leg last week but we wrapped it in any case 7/27; culture of the blisters on his left anterior leg grew staph aureus. I gave him doxycycline which he started yesterday. He is already appear cleaner and dry. His major wound is on the right anterior lower leg and the right lateral lower leg. There is nothing else open on the left leg. He has better edema control today. 03/04/20-Patient comes in with new areas after he had a fall, on his  left dorsal foot and right dorsal foot and left medial proximal leg, there is significant amount of drainage into his dressing that was removed today which is actually greenish in color. It looks like he is completed a course of doxycycline for staph that grew from his blisters however he might need antipseudomonal coverage at this point 8/16; apparently the wounds originally were all healed until his last visit when after a fall he had areas on the left dorsal foot, right dorsal foot and left medial proximal leg. T oday all of these have healed and he has a small area on the left medial ankle. The patient has severe bilateral lymphedema which is managed usually with compression stockings/external compression stockings as well his mid abdomen compression pumps. He is still using the latter twice a day. 8/23; the wounds on his dorsal feet and medial ankle that he had from a fall last time of closed however he has a new wrap injury on the left medial upper leg and a smaller 1 anteriorly. He is using his compression pumps twice a day 8/30; nothing open on the left leg. He is  in his stocking. Using his compression pumps twice a day. From last week still a small opening in the medial upper leg on the right he has a new area in the upper mid tibia. He thinks this might have been a wrap injury looks more like the remanent of the blister 9/7; the areas on the right anterior and right medial lower leg have both closed over. He came in for a nurse visit today but I looked at the right leg and indeed it is healed. His edema control is moderate but certainly a lot better than when I first saw this. He is using his compression pumps twice a day over his juxta lite stockings 9/14; the area on the right anterior and right medial lower legs closed over last week and we discharged him into his own external compression stocking. This is in accompaniment of abdominal level external compression pumps that he uses for 45  minutes twice a day. He states that the control lasted till about Friday he started to have weeping edema and then by Saturday things look like they were starting to breakdown. He arrives in clinic today with a large erythematous area on the right lateral calf with weeping edema fluid also on the medial and anterior ankle. 9/21; this is a patient I actually discharged from clinic 2 weeks ago into his own stocking and external compression pumps. He arrived last week with uncontrolled edema and weeping fluid from the right leg we. We put him back in compression he has a juxta lite stocking I believe on the left leg which does not have any open wounds. He arrives in clinic today weeping open wounds on the right lateral calf. Apparently some purulent drainage when he came in. Most impressive for diffuse very tender erythema extending up to the level of his tibial tuberosity. 9/27; patient I sent to the hospital last week with an intense cellulitis of the right leg. He was admitted from 9/20 through 04/15/2020 he received broad-spectrum IV antibiotics and was discharged on Keflex and ciprofloxacin. His leg is a lot better he only has 1 open wound on the right lateral lower leg 10/4; patient with severe bilateral lymphedema. He has recovered from the cellulitis of a few weeks ago at which time we had to send him to the emergency room.. We have also discharge this man in early September but he had a quick turnaround. He has abdominal level external compression pumps he uses these twice a day. He has Farrow wrap lower extremity stockings 11/1; this is a patient we discharged 3 weeks ago. He tells [place] that roughly 2 weeks ago he developed wounds on his right lower extremity 1 proximally and 1 distally. Not sure if these started as blisters. He relates he is now in the independent part of the [place] complex not sure if that had anything to do with this. He has his Farrow wraps and his abdominal level external  compression pumps which he says he is using twice a day. He comes in today with the left leg stocking on properly Electronic Signature(s) Signed: 05/24/2020 5:37:06 PM By: Linton Ham MD Entered By: Linton Ham on 05/24/2020 17:17:21 -------------------------------------------------------------------------------- Physical Exam Details Patient Name: Date of Service: Nicholas Junes DFO RD K. 05/24/2020 3:30 PM Medical Record Number: [id number] Patient Account Number: [id number] Date of Birth/Sex: Treating RN: [birth year] (74 y.o. [name] Primary Care Provider: [name] Other Clinician: Referring Provider: Treating Provider/Extender: [name] in Treatment:  53 Constitutional Patient is hypertensive.. Pulse regular and within target range for patient.Marland Kitchen Respirations regular, non-labored and within target range.. Temperature is normal and within the target range for the patient.Marland Kitchen Appears in no distress. Cardiovascular Needle pulses are palpable on the right. Integumentary (Hair, Skin) There is no evidence of infection in any wound area. Notes Wound exam; again right anterior just above the ankle and a right anterior proximal. Both of these are superficial wounds there is some epithelialization on them already. He has lymphedema 3+ although this is not nearly as bad as I have seen this man previously. Electronic Signature(s) Signed: 05/24/2020 5:37:06 PM By: Linton Ham MD Entered By: Linton Ham on 05/24/2020 17:18:28 -------------------------------------------------------------------------------- Physician Orders Details Patient Name: Date of Service: Nicholas Junes DFO RD K. 05/24/2020 3:30 PM Medical Record Number: [id number] Patient Account Number: [id number] Date of Birth/Sex: Treating RN: Dec 18, 1945 (74 y.o. [name] Primary Care Provider: [name] Other Clinician: Referring Provider: Treating Provider/Extender:  [name] in Treatment: 19 Verbal / Phone Orders: No Diagnosis Coding ICD-10 Coding Code Description I89.0 Lymphedema, not elsewhere classified L97.811 Non-pressure chronic ulcer of other part of right lower leg limited to breakdown of skin L03.115 Cellulitis of right lower limb Follow-up Appointments Return Appointment in 1 week. Dressing Change Frequency Do not change entire dressing for one week. Skin Barriers/Peri-Wound Care Moisturizing lotion Wound Cleansing May shower with protection. - with cast protector Primary Wound Dressing Wound #21 Right,Lateral Lower Leg Calcium Alginate with Silver Wound #22 Right,Distal,Lateral Lower Leg Calcium Alginate with Silver Secondary Dressing Wound #21 Right,Lateral Lower Leg Dry Gauze ABD pad Wound #22 Right,Distal,Lateral Lower Leg Dry Gauze ABD pad Edema Control 4 layer compression - Right Lower Extremity - unna boot top of wrap to help anchor Avoid standing for long periods of time Elevate legs to the level of the heart or above for 30 minutes daily and/or when sitting, a frequency of: - throughout the day. Exercise regularly Support Garment 30-40 mm/Hg pressure to: - Farrow wrap to left leg daily Segmental Compressive Device. - use lymphedema pumps twice a day an hour each time. Once in the morning and once in the evening. Electronic Signature(s) Signed: 05/24/2020 5:37:06 PM By: Linton Ham MD Signed: 05/24/2020 5:57:05 PM By: Levan Hurst RN, BSN Entered By: Levan Hurst on 05/24/2020 16:47:02 -------------------------------------------------------------------------------- Problem List Details Patient Name: Date of Service: Nicholas Junes DFO RD K. 05/24/2020 3:30 PM Medical Record Number: [id number] Patient Account Number: [id number] Date of Birth/Sex: Treating RN: 07-Mar-1946 (74 y.o. 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 05/24/2020 3:30 PM Medical Record Number: [id number] Patient Account Number: [id number] Date of Birth/Sex: Treating RN: 1946-07-05 (74 y.o. [name] Primary Care Provider: [name] Other Clinician: Referring Provider: Treating Provider/Extender:  [name] in Treatment: 19 Subjective History of Present Illness (HPI) ADMISSION 07/04/2018 This is a 74 year old man who has bilateral lymphedema felt to be secondary to previous treatment for malignant melanoma on his left toes. 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 Malignant melanoma was treated with immunotherapy. He is not had radiation. Sometime after this he developed edema in the left leg that spread into the right leg. He has bilateral compression pumps at home and he uses them twice a day and claims to be quite compliant. He also has a juxta lite type stocking at home which is apparently 66-year-old. He states that he fell in early October dislocating and fracturing his left shoulder. He slept in a  recliner with his legs dependent. He was seen and followed at the rehab clinic at [facility] and was having lymphedema wraps done by 1 of the PT who noted his open wounds last week and he has been referred here for management. The patient has 2 open wounds on the right s distal lower leg and one on the left anterior lower leg The patient is not a diabetic. 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 For one reason or another we could not get him set up with home health and he kept the wraps on all week which he generally seem to tolerate. He did not use his compression pumps over the top of the wraps which I told him he could do this week. He comes in with a new rash on both feet which looks like tinea 07/22/2018; much better edema control bilaterally. The wounds on the right lateral calf and left anterior lower leg both look a lot better. There is epithelialization over both surfaces but I think this is fragile and I am going to put him in our compression wraps for another week. He says he is been using his compression pumps at home. ooHe has a constellation of lower extremity compression garments that he is going to bring in next week. In discussion with him I cannot really get a sense of everything he has. I think he was at one point prescribed 30-40 below-knee stockings but he points out he could not get them on. 07/29/2018; the patient arrives in his legs are healed. He has a constellation of stockings none of which I think are going to be that helpful except for the fact that he has external wraparound stockings of some description. He has  above-knee stockings but I cannot imagine it would be possible to get these on READMISSION 01/08/2020 Patient is now a 74 year old man. We had him for a short period from December 2019 through January 2020. He has bilateral lower extremity lymphedema which he initially traces to treatment for malignant melanoma 7 years ago. He has been followed at the lymphedema clinic on [facility] and recently has compression pumps that go up to his mid abdomen. He recently developed superficial wounds bilaterally. They will not manage these in the lymphedema clinic where the services are run by therapy. He has not been using his compression pumps. His significant other cannot get the stockings on his legs and  the patient is only limited ability to help because of Parkinson's disease Past medical history is essentially unchanged. He has sleep apnea, Parkinson's disease at some point labeled his Parkinson's plus, bilateral lymphedema, history of malignant melanoma. He does not have an arterial issue that I am aware of although his previous arterial studies have been noncompressible 7/1; patient we admitted the clinic 2 weeks ago. He has severe bilateral lymphedema. He had wounds predominantly on the left leg but also superficially on the right. He has compression pumps which he says he is using once a day. 7/15; 2-week follow-up. He comes in for nurse visit. He has severe bilateral lymphedema. He has upper abdominal level compression pumps which she is using twice a day. We have been wrapping both his lower legs the areas on the right leg are healed. He still has an area of left upper lateral and right anterior medial. Anterior medial wound is small. He is tolerating this well. 7/22; still significant lymphedema even with 4-layer compression and external compression pump usage. He said he had some itching on the right anterior leg he comes in with a large superficial area of skin breakdown anteriorly and a  smaller area laterally on the right leg. He also has a rash on the left dorsal ankle crease. Some of these look pustular. I wonder if this is an occlusion issue from foam we put down here. He also states the only thing different this week is the did a 5-hour car ride with his legs dependent. There was nothing open on the right leg last week but we wrapped it in any case 7/27; culture of the blisters on his left anterior leg grew staph aureus. I gave him doxycycline which he started yesterday. He is already appear cleaner and dry. His major wound is on the right anterior lower leg and the right lateral lower leg. There is nothing else open on the left leg. He has better edema control today. 03/04/20-Patient comes in with new areas after he had a fall, on his left dorsal foot and right dorsal foot and left medial proximal leg, there is significant amount of drainage into his dressing that was removed today which is actually greenish in color. It looks like he is completed a course of doxycycline for staph that grew from his blisters however he might need antipseudomonal coverage at this point 8/16; apparently the wounds originally were all healed until his last visit when after a fall he had areas on the left dorsal foot, right dorsal foot and left medial proximal leg. T oday all of these have healed and he has a small area on the left medial ankle. The patient has severe bilateral lymphedema which is managed usually with compression stockings/external compression stockings as well his mid abdomen compression pumps. He is still using the latter twice a day. 8/23; the wounds on his dorsal feet and medial ankle that he had from a fall last time of closed however he has a new wrap injury on the left medial upper leg and a smaller 1 anteriorly. He is using his compression pumps twice a day 8/30; nothing open on the left leg. He is in his stocking. Using his compression pumps twice a day. From last week still  a small opening in the medial upper leg on the right he has a new area in the upper mid tibia. He thinks this might have been a wrap injury looks more like the remanent of the blister 9/7; the areas  on the right anterior and right medial lower leg have both closed over. He came in for a nurse visit today but I looked at the right leg and indeed it is healed. His edema control is moderate but certainly a lot better than when I first saw this. He is using his compression pumps twice a day over his juxta lite stockings 9/14; the area on the right anterior and right medial lower legs closed over last week and we discharged him into his own external compression stocking. This is in accompaniment of abdominal level external compression pumps that he uses for 45 minutes twice a day. He states that the control lasted till about Friday he started to have weeping edema and then by Saturday things look like they were starting to breakdown. He arrives in clinic today with a large erythematous area on the right lateral calf with weeping edema fluid also on the medial and anterior ankle. 9/21; this is a patient I actually discharged from clinic 2 weeks ago into his own stocking and external compression pumps. He arrived last week with uncontrolled edema and weeping fluid from the right leg we. We put him back in compression he has a juxta lite stocking I believe on the left leg which does not have any open wounds. ooHe arrives in clinic today weeping open wounds on the right lateral calf. Apparently some purulent drainage when he came in. Most impressive for diffuse very tender erythema extending up to the level of his tibial tuberosity. 9/27; patient I sent to the hospital last week with an intense cellulitis of the right leg. He was admitted from 9/20 through 04/15/2020 he received broad-spectrum IV antibiotics and was discharged on Keflex and ciprofloxacin. His leg is a lot better he only has 1 open wound on the  right lateral lower leg 10/4; patient with severe bilateral lymphedema. He has recovered from the cellulitis of a few weeks ago at which time we had to send him to the emergency room.. We have also discharge this man in early September but he had a quick turnaround. He has abdominal level external compression pumps he uses these twice a day. He has Farrow wrap lower extremity stockings 11/1; this is a patient we discharged 3 weeks ago. He tells [place] that roughly 2 weeks ago he developed wounds on his right lower extremity 1 proximally and 1 distally. Not sure if these started as blisters. He relates he is now in the independent part of the [place] complex not sure if that had anything to do with this. He has his Farrow wraps and his abdominal level external compression pumps which he says he is using twice a day. He comes in today with the left leg stocking on properly Objective Constitutional Patient is hypertensive.. Pulse regular and within target range for patient.Marland Kitchen Respirations regular, non-labored and within target range.. Temperature is normal and within the target range for the patient.Marland Kitchen Appears in no distress. Vitals Time Taken: 4:05 PM, Height: 67 in, Weight: 250 lbs, BMI: 39.2, Temperature: 97.4 F, Pulse: 93 bpm, Respiratory Rate: 18 breaths/min, Blood Pressure: 156/93 mmHg. Cardiovascular Needle pulses are palpable on the right. General Notes: Wound exam; again right anterior just above the ankle and a right anterior proximal. Both of these are superficial wounds there is some epithelialization on them already. He has lymphedema 3+ although this is not nearly as bad as I have seen this man previously. Integumentary (Hair, Skin) There is no evidence of infection in any wound  area. Wound #21 status is Open. Original cause of wound was Gradually Appeared. The wound is located on the Right,Lateral Lower Leg. The wound measures 5.6cm length x 4.3cm width x 0.1cm depth; 18.912cm^2 area  and 1.891cm^3 volume. There is Fat Layer (Subcutaneous Tissue) exposed. There is no tunneling or undermining noted. There is a medium amount of serosanguineous drainage noted. There is medium (34-66%) pink, pale granulation within the wound bed. There is a medium (34-66%) amount of necrotic tissue within the wound bed including Adherent Slough. Wound #22 status is Open. Original cause of wound was Gradually Appeared. The wound is located on the Right,Distal,Lateral Lower Leg. The wound measures 3.5cm length x 7cm width x 0.1cm depth; 19.242cm^2 area and 1.924cm^3 volume. There is Fat Layer (Subcutaneous Tissue) exposed. There is no tunneling or undermining noted. There is a medium amount of serosanguineous drainage noted. There is medium (34-66%) pink granulation within the wound bed. There is a medium (34-66%) amount of necrotic tissue within the wound bed including Adherent Slough. Assessment Active Problems ICD-10 Lymphedema, not elsewhere classified Non-pressure chronic ulcer of other part of right lower leg limited to breakdown of skin Procedures Wound #21 Pre-procedure diagnosis of Wound #21 is a Lymphedema located on the Right,Lateral Lower Leg . There was a Four Layer Compression Therapy Procedure by Levan Hurst, RN. Post procedure Diagnosis Wound #21: Same as Pre-Procedure Wound #22 Pre-procedure diagnosis of Wound #22 is a Lymphedema located on the Right,Distal,Lateral Lower Leg . There was a Four Layer Compression Therapy Procedure by Levan Hurst, RN. Post procedure Diagnosis Wound #22: Same as Pre-Procedure Plan Follow-up Appointments: Return Appointment in 1 week. Dressing Change Frequency: Do not change entire dressing for one week. Skin Barriers/Peri-Wound Care: Moisturizing lotion Wound Cleansing: May shower with protection. - with cast protector Primary Wound Dressing: Wound #21 Right,Lateral Lower Leg: Calcium Alginate with Silver Wound #22  Right,Distal,Lateral Lower Leg: Calcium Alginate with Silver Secondary Dressing: Wound #21 Right,Lateral Lower Leg: Dry Gauze ABD pad Wound #22 Right,Distal,Lateral Lower Leg: Dry Gauze ABD pad Edema Control: 4 layer compression - Right Lower Extremity - unna boot top of wrap to help anchor Avoid standing for long periods of time Elevate legs to the level of the heart or above for 30 minutes daily and/or when sitting, a frequency of: - throughout the day. Exercise regularly Support Garment 30-40 mm/Hg pressure to: - Farrow wrap to left leg daily Segmental Compressive Device. - use lymphedema pumps twice a day an hour each time. Once in the morning and once in the evening. 1. Silver alginate to the wounds ABDs under 4-layer compression 2. I have asked him to continue to use his external compression pumps and to keep his leg elevated when he can 3. I have not been able to maintain skin integrity in this man this is the second time I have discharged him in each time he is back in less than a month. May need to try to increase the external compression pumps to 3 times a day Electronic Signature(s) Signed: 05/24/2020 5:37:06 PM By: Linton Ham MD Entered By: Linton Ham on 05/24/2020 17:19:32 -------------------------------------------------------------------------------- SuperBill Details Patient Name: Date of Service: Nicholas Junes DFO RD K. 05/24/2020 Medical Record Number: [id number] Patient Account Number: [id number] Date of Birth/Sex: Treating RN: October 24, 1945 (74 y.o. [name] Primary Care Provider: [name] Other Clinician: Referring Provider: Treating Provider/Extender: [name] in Treatment: 19 Diagnosis Coding ICD-10 Codes Code Description I89.0 Lymphedema, not elsewhere classified L97.811 Non-pressure  chronic ulcer of other part of right lower leg limited to breakdown of skin Facility Procedures CPT4 Code:  [id number] Description: (Facility Use Only) [phone] - [place] [facility] RT LEG Modifier: Quantity: 1 Physician Procedures : CPT4 Code Description Modifier [id number] - WC PHYS LEVEL 3 - EST PT ICD-10 Diagnosis Description I89.0 Lymphedema, not elsewhere classified L97.811 Non-pressure chronic ulcer of other part of right lower leg limited to breakdown of skin Quantity: 1 Electronic Signature(s) Signed: 05/24/2020 5:57:05 PM By: Levan Hurst RN, BSN Signed: 05/25/2020 4:57:00 PM By: Linton Ham MD Previous Signature: 05/24/2020 5:37:06 PM Version By: Linton Ham MD Entered By: Levan Hurst on 05/24/2020 17:37:51

## 2020-05-26 NOTE — Progress Notes (Signed)
Nicholas [name] ([id number]) , Visit Report for 05/24/2020 Arrival Information Details Patient Name: Date of Service: Nicholas [name] DFO RD K. 05/24/2020 3:30 PM Medical Record Number: [id number] Patient Account Number: [id number] Date of Birth/Sex: Treating RN: 1946/01/03 (74 y.o. [name]) Carlene Coria Primary Care Clotilda Hafer: [name] Other Clinician: Referring Olamae Ferrara: Treating Liliani Bobo/Extender: [name] in Treatment: 40 Visit Information History Since Last Visit All ordered tests and consults were completed: No Patient Arrived: Wheel Chair Added or deleted any medications: No Arrival Time: 16:00 Any new allergies or adverse reactions: No Accompanied By: self Had a fall or experienced change in No Transfer Assistance: Manual activities of daily living that may affect Patient Identification Verified: Yes risk of falls: Secondary Verification Process Completed: Yes Signs or symptoms of abuse/neglect since last visito No Patient Requires Transmission-Based Precautions: No Hospitalized since last visit: No Patient Has Alerts: Yes Implantable device outside of the clinic excluding No Patient Alerts: ABI: [place] 07/2018 cellular tissue based products placed in the center since last visit: Has Dressing in Place as Prescribed: Yes Pain Present Now: No Electronic Signature(s) Signed: 05/26/2020 9:08:43 AM By: Carlene Coria RN Entered By: Carlene Coria on 05/24/2020 16:05:00 -------------------------------------------------------------------------------- Compression Therapy Details Patient Name: Date of Service: Nicholas [name] DFO RD K. 05/24/2020 3:30 PM Medical Record Number: [id number] Patient Account Number: [id number] Date of Birth/Sex: Treating RN: [birth year] (74 y.o. [name] Primary Care Medha Pippen: [name] Other Clinician: Referring Nikitta Sobiech: Treating Chanz Cahall/Extender: [name] in Treatment: 19 Compression  Therapy Performed for Wound Assessment: Wound #21 Right,Lateral Lower Leg Performed By: Clinician Levan Hurst, RN Compression Type: Four Layer Post Procedure Diagnosis Same as Pre-procedure Electronic Signature(s) Signed: 05/24/2020 5:57:05 PM By: Levan Hurst RN, BSN Entered By: Levan Hurst on 05/24/2020 16:47:47 -------------------------------------------------------------------------------- Compression Therapy Details Patient Name: Date of Service: Nicholas [name] DFO RD K. 05/24/2020 3:30 PM Medical Record Number: [id number] Patient Account Number: [id number] Date of Birth/Sex: Treating RN: 1945/12/01 (74 y.o. [name] Primary Care Novella Abraha: [name] Other Clinician: Referring Mark Hassey: Treating Karmen Altamirano/Extender: [name] in Treatment: 19 Compression Therapy Performed for Wound Assessment: Wound #22 Right,Distal,Lateral Lower Leg Performed By: Clinician Levan Hurst, RN Compression Type: Four Layer Post Procedure Diagnosis Same as Pre-procedure Electronic Signature(s) Signed: 05/24/2020 5:57:05 PM By: Levan Hurst RN, BSN Entered By: Levan Hurst on 05/24/2020 16:47:48 -------------------------------------------------------------------------------- Encounter Discharge Information Details Patient Name: Date of Service: Nicholas [name] DFO RD K. 05/24/2020 3:30 PM Medical Record Number: [id number] Patient Account Number: [id number] Date of Birth/Sex: Treating RN: August 12, 1945 (74 y.o. [name] Primary Care Guled Gahan: [name] Other Clinician: Referring Ryane Konieczny: Treating Meah Jiron/Extender: [name] in Treatment: 19 Encounter Discharge Information Items Discharge Condition: Stable Ambulatory Status: Wheelchair Discharge Destination: Home Transportation: Private Auto Accompanied By: caregiver Schedule Follow-up Appointment: Yes Clinical Summary of Care: Electronic  Signature(s) Signed: 05/24/2020 6:07:40 PM By: Deon Pilling Entered By: Deon Pilling on 05/24/2020 18:06:10 -------------------------------------------------------------------------------- Lower Extremity Assessment Details Patient Name: Date of Service: Nicholas [name] DFO RD K. 05/24/2020 3:30 PM Medical Record Number: [id number] Patient Account Number: [id number] Date of Birth/Sex: Treating RN: April 28, 1946 (74 y.o. [name] Primary Care Florita Nitsch: [name] Other Clinician: Referring [name]: Treating [name]/Extender: [name] in Treatment: 19 Edema Assessment Assessed: [name]: No] [name]: No] Edema: [Left: Ye] [Right: s] Calf Left: Right: Point of Measurement: 40 cm From Medial Instep 48 cm Ankle Left: Right:  Point of Measurement: 19 cm From Medial Instep 38 cm Electronic Signature(s) Signed: 05/26/2020 9:08:43 AM By: Carlene Coria RN Entered By: Carlene Coria on 05/24/2020 16:10:00 -------------------------------------------------------------------------------- Multi Wound Chart Details Patient Name: Date of Service: Nicholas [name] DFO RD K. 05/24/2020 3:30 PM Medical Record Number: [id number] Patient Account Number: [id number] Date of Birth/Sex: Treating RN: 01/04/1946 (74 y.o. [name] Primary Care [name]: [name] Other Clinician: Referring [name]: Treating [name]/Extender: [name] in Treatment: 19 Vital Signs Height(in): 67 Pulse(bpm): 93 Weight(lbs): 250 Blood Pressure(mmHg): 156/93 Body Mass Index(BMI): 39 Temperature(F): 97.4 Respiratory Rate(breaths/min): 18 Photos: [21:No Photos Right Lower Leg] [22:No Photos Right, Distal Lower Leg] [N/A:N/A N/A] Wound Location: [21:Gradually Appeared] [22:Gradually Appeared] [N/A:N/A] Wounding Event: [21:Lymphedema] [22:Lymphedema] [N/A:N/A] Primary Etiology: [21:Lymphedema, Sleep Apnea,] [22:Lymphedema, Sleep Apnea,]  [N/A:N/A] Comorbid History: [21:Congestive Heart Failure, Hypertension, Peripheral Venous Disease, Received Chemotherapy 05/19/2020] [22:Congestive Heart Failure, Hypertension, Peripheral Venous Disease, Received Chemotherapy 05/19/2020] [N/A:N/A] Date Acquired: [21:0] [22:0] [N/A:N/A] Weeks of Treatment: [21:Open] [22:Open] [N/A:N/A] Wound Status: [21:5.6x4.3x0.1] [22:3.5x7x0.1] [N/A:N/A] Measurements L x W x D (cm) [21:18.912] [22:19.242] [N/A:N/A] A (cm) : rea [21:1.891] [22:1.924] [N/A:N/A] Volume (cm) : [21:Full Thickness Without Exposed] [22:Full Thickness Without Exposed] [N/A:N/A] Classification: [21:Support Structures Medium] [22:Support Structures Medium] [N/A:N/A] Exudate Amount: [21:Serosanguineous] [22:Serosanguineous] [N/A:N/A] Exudate Type: [21:red, brown] [22:red, brown] [N/A:N/A] Exudate Color: [21:Medium (34-66%)] [22:Medium (34-66%)] [N/A:N/A] Granulation Amount: [21:Pink, Pale] [22:Pink] [N/A:N/A] Granulation Quality: [21:Medium (34-66%)] [22:Medium (34-66%)] [N/A:N/A] Necrotic Amount: [21:Fat Layer (Subcutaneous Tissue): Yes Fat Layer (Subcutaneous Tissue): Yes N/A] Exposed Structures: [21:Fascia: No Tendon: No Muscle: No Joint: No Bone: No None] [22:Fascia: No Tendon: No Muscle: No Joint: No Bone: No None] [N/A:N/A] Epithelialization: [21:Compression Therapy] [22:Compression Therapy] [N/A:N/A] Treatment Notes Electronic Signature(s) Signed: 05/24/2020 5:37:06 PM By: Linton Ham MD Signed: 05/24/2020 5:57:05 PM By: Levan Hurst RN, BSN Entered By: Linton Ham on 05/24/2020 17:14:06 -------------------------------------------------------------------------------- Multi-Disciplinary Care Plan Details Patient Name: Date of Service: Nicholas [name] DFO RD K. 05/24/2020 3:30 PM Medical Record Number: [id number] Patient Account Number: [id number] Date of Birth/Sex: Treating RN: Jan 16, 1946 (74 y.o. [name] Primary Care Jakyron Fabro: [name] Other  Clinician: Referring Calum Cormier: Treating Jani Ploeger/Extender: [name] in Treatment: 19 Active Inactive Wound/Skin Impairment Nursing Diagnoses: Knowledge deficit related to ulceration/compromised skin integrity Goals: Patient/caregiver will verbalize understanding of skin care regimen Date Initiated: 01/08/2020 Target Resolution Date: 06/25/2020 Goal Status: Active Ulcer/skin breakdown will have a volume reduction of 30% by week 4 Date Initiated: 01/08/2020 Date Inactivated: 02/05/2020 Target Resolution Date: 01/30/2020 Goal Status: Met Interventions: Assess patient/caregiver ability to obtain necessary supplies Assess patient/caregiver ability to perform ulcer/skin care regimen upon admission and as needed Provide education on ulcer and skin care Treatment Activities: Skin care regimen initiated : 01/08/2020 Topical wound management initiated : 01/08/2020 Notes: Electronic Signature(s) Signed: 05/24/2020 5:57:05 PM By: Levan Hurst RN, BSN Entered By: Levan Hurst on 05/24/2020 17:37:21 -------------------------------------------------------------------------------- Pain Assessment Details Patient Name: Date of Service: Nicholas [name] DFO RD K. 05/24/2020 3:30 PM Medical Record Number: [id number] Patient Account Number: [id number] Date of Birth/Sex: Treating RN: 1945/08/27 (74 y.o. [name] Primary Care Jeet Shough: [name] Other Clinician: Referring Floyed Masoud: Treating Bertram Haddix/Extender: [name] in Treatment: 19 Active Problems Location of Pain Severity and Description of Pain Patient Has Paino No Patient Has Paino No Site Locations Pain Management and Medication Current Pain Management: Electronic Signature(s) Signed: 05/26/2020 9:08:43 AM By: Carlene Coria RN Entered By: Carlene Coria on 05/24/2020 16:05:29 --------------------------------------------------------------------------------  Patient/Caregiver  Education Details Patient Name: Date of Service: Nicholas [name] DFO RD K. 11/1/2021andnbsp3:30 PM Medical Record Number: [id number] Patient Account Number: [id number] Date of Birth/Gender: Treating RN: [birth year] (74 y.o. [name] Primary Care Physician: [name] Other Clinician: Referring Physician: Treating Physician/Extender: [name] in Treatment: 19 Education Assessment Education Provided To: Patient Education Topics Provided Wound/Skin Impairment: Methods: Explain/Verbal Responses: State content correctly [employer]) Signed: 05/24/2020 5:57:05 PM By: Levan Hurst RN, BSN Entered By: Levan Hurst on 05/24/2020 17:37:33 -------------------------------------------------------------------------------- Wound Assessment Details Patient Name: Date of Service: Nicholas [name] DFO RD K. 05/24/2020 3:30 PM Medical Record Number: [id number] Patient Account Number: [id number] Date of Birth/Sex: Treating RN: [birth year] (74 y.o. [name]) Carlene Coria Primary Care Croix Presley: [name] Other Clinician: Referring Christan Defranco: Treating Arisa Congleton/Extender: [name] in Treatment: 19 Wound Status Wound Number: 21 Primary Lymphedema Etiology: Wound Location: Right Lower Leg Wound Open Wounding Event: Gradually Appeared Status: Date Acquired: 05/19/2020 Comorbid Lymphedema, Sleep Apnea, Congestive Heart Failure, Weeks Of Treatment: 0 History: Hypertension, Peripheral Venous Disease, Received Chemotherapy Clustered Wound: No Wound Measurements Length: (cm) 5.6 Width: (cm) 4.3 Depth: (cm) 0.1 Area: (cm) 18.912 Volume: (cm) 1.891 % Reduction in Area: % Reduction in Volume: Epithelialization: None Tunneling: No Undermining: No Wound Description Classification: Full Thickness Without Exposed Support Structures Exudate Amount: Medium Exudate Type: Serosanguineous Exudate Color: red, brown Foul Odor  After Cleansing: No Slough/Fibrino Yes Wound Bed Granulation Amount: Medium (34-66%) Exposed Structure Granulation Quality: Pink, Pale Fascia Exposed: No Necrotic Amount: Medium (34-66%) Fat Layer (Subcutaneous Tissue) Exposed: Yes Necrotic Quality: Adherent Slough Tendon Exposed: No Muscle Exposed: No Joint Exposed: No Bone Exposed: No Electronic Signature(s) Signed: 05/26/2020 9:08:43 AM By: Carlene Coria RN Entered By: Carlene Coria on 05/24/2020 16:20:17 -------------------------------------------------------------------------------- Wound Assessment Details Patient Name: Date of Service: Nicholas [name] DFO RD K. 05/24/2020 3:30 PM Medical Record Number: [id number] Patient Account Number: [id number] Date of Birth/Sex: Treating RN: December 27, 1945 (74 y.o. [name]) Carlene Coria Primary Care Dow Blahnik: [name] Other Clinician: Referring Preet Perrier: Treating Andrej Spagnoli/Extender: [name] in Treatment: 19 Wound Status Wound Number: 22 Primary Lymphedema Etiology: Wound Location: Right, Distal Lower Leg Wound Open Wounding Event: Gradually Appeared Status: Date Acquired: 05/19/2020 Comorbid Lymphedema, Sleep Apnea, Congestive Heart Failure, Weeks Of Treatment: 0 History: Hypertension, Peripheral Venous Disease, Received Chemotherapy Clustered Wound: No Wound Measurements Length: (cm) 3.5 Width: (cm) 7 Depth: (cm) 0.1 Area: (cm) 19.242 Volume: (cm) 1.924 % Reduction in Area: % Reduction in Volume: Epithelialization: None Tunneling: No Undermining: No Wound Description Classification: Full Thickness Without Exposed Support Structures Exudate Amount: Medium Exudate Type: Serosanguineous Exudate Color: red, brown Foul Odor After Cleansing: No Slough/Fibrino Yes Wound Bed Granulation Amount: Medium (34-66%) Exposed Structure Granulation Quality: Pink Fascia Exposed: No Necrotic Amount: Medium (34-66%) Fat Layer (Subcutaneous Tissue) Exposed:  Yes Necrotic Quality: Adherent Slough Tendon Exposed: No Muscle Exposed: No Joint Exposed: No Bone Exposed: No Electronic Signature(s) Signed: 05/26/2020 9:08:43 AM By: Carlene Coria RN Entered By: Carlene Coria on 05/24/2020 16:22:09 -------------------------------------------------------------------------------- Vitals Details Patient Name: Date of Service: Nicholas [name] DFO RD K. 05/24/2020 3:30 PM Medical Record Number: [id number] Patient Account Number: [id number] Date of Birth/Sex: Treating RN: 02-09-1946 (74 y.o. [name] Primary Care Thoma Paulsen: [name] Other Clinician: Referring Anaeli Cornwall: Treating Caelan Atchley/Extender: [name] in Treatment: 19 Vital Signs Time Taken: 16:05 Temperature (F): 97.4 Height (in): 67 Pulse (bpm): 93 Weight (lbs): 250 Respiratory Rate (  breaths/min): 18 Body Mass Index (BMI): 39.2 Blood Pressure (mmHg): 156/93 Reference Range: 80 - 120 mg / dl Electronic Signature(s) Signed: 05/26/2020 9:08:43 AM By: Carlene Coria RN Entered By: Carlene Coria on 05/24/2020 16:05:23

## 2020-05-28 ENCOUNTER — Other Ambulatory Visit: Payer: Self-pay

## 2020-05-28 ENCOUNTER — Other Ambulatory Visit: Payer: Self-pay | Admitting: Neurology

## 2020-05-28 ENCOUNTER — Encounter: Payer: Self-pay | Admitting: Physical Therapy

## 2020-05-28 ENCOUNTER — Ambulatory Visit: Payer: PPO | Attending: Family Medicine | Admitting: Physical Therapy

## 2020-05-28 VITALS — BP 136/76 | HR 72

## 2020-05-28 DIAGNOSIS — R2681 Unsteadiness on feet: Secondary | ICD-10-CM | POA: Insufficient documentation

## 2020-05-28 DIAGNOSIS — R4701 Aphasia: Secondary | ICD-10-CM | POA: Diagnosis not present

## 2020-05-28 DIAGNOSIS — R471 Dysarthria and anarthria: Secondary | ICD-10-CM | POA: Insufficient documentation

## 2020-05-28 DIAGNOSIS — R29818 Other symptoms and signs involving the nervous system: Secondary | ICD-10-CM | POA: Insufficient documentation

## 2020-05-28 DIAGNOSIS — R2689 Other abnormalities of gait and mobility: Secondary | ICD-10-CM

## 2020-05-28 DIAGNOSIS — R41841 Cognitive communication deficit: Secondary | ICD-10-CM | POA: Diagnosis not present

## 2020-05-28 DIAGNOSIS — M6281 Muscle weakness (generalized): Secondary | ICD-10-CM | POA: Diagnosis not present

## 2020-05-28 NOTE — Therapy (Addendum)
[facility] [address] [place], [place], [address] Phone: [phone]   Fax:  [phone]  Physical Therapy Treatment  Patient Details  Name: Nicholas [name] MRN: [id number] Date of Birth: 12-Feb-1946 Referring Provider (PT): [name], DO   Encounter Date: 05/28/2020   PT End of Session - 05/28/20 1637    Visit Number 2    Number of Visits 9    Date for PT Re-Evaluation 07/20/20    Authorization Type HTA, VL:  [place] No auth    Progress Note Due on Visit 10    PT Start Time 1533    PT Stop Time 1615    PT Time Calculation (min) 42 min    Equipment Utilized During Treatment Gait belt    Activity Tolerance Patient tolerated treatment well    Behavior During Therapy WFL for tasks assessed/performed           Past Medical History:  Diagnosis Date  . Cancer 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 Hyperlipidemia   . Hypertension   . Lymphedema     Past Surgical History:  Procedure Laterality Date  . 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 removal of melanoma of left foot/amputation of toes 1&25 Jul 2006  . WRIST FRACTURE SURGERY     74 years old-set    Vitals:   05/28/20 1605  BP: 136/76  Pulse: 72     Subjective Assessment - 05/28/20 1540    Subjective Fell one time in the past week - tripped over a rug. Had to call EMS to help him get up off the ground. Has a big bruise on right upper arm. Feeling better now.    Patient is accompained by: --   caregiver, Gerald Stabs   Pertinent History PMH melanoma s/p amputation toes on L foot, HTN, hyperlipidemia, lymphedema    Limitations Walking;Standing;House hold activities    Patient Stated Goals Pt's goals for therapy are to get back in the pool for exercise    Currently in Pain? No/denies              [facility] PT Assessment - 05/28/20 1548      Standardized Balance Assessment   Standardized Balance Assessment Timed Up and  Go Test      Timed Up and Go Test   Normal TUG (seconds) --   2 minutes and 34 seconds   TUG Comments with rollator             Access Code: [email or web address] URL: [email or web address] Date: 05/28/2020 Prepared by: Janann August  Initiated HEP for BLE strengthening: see Medbridge for additional details.   Exercises Seated March - 2 x daily - 5 x weekly - 2 sets - 10 reps Seated Long Arc Quad - 2 x daily - 5 x weekly - 1 sets - 10 reps Seated Active Hip Flexion - 2 x daily - 5 x weekly - 2 sets - 10 reps Seated Heel Toe Raises - 2 x daily - 5 x weekly - 2 sets - 10 reps Seated Hip Adduction Isometrics with Ball - 2 x daily - 5 x weekly - 2 sets - 5 reps   Pt reporting feeling hot while performing seated hip ADD - assessed pt's BP and WFL        [facility]  Lymphedema Life Impact Scale Total Score 14.71 %  05/30/20 0001  Transfers  Transfers Sit to Stand;Stand to Sit  Sit to Stand 4: Min assist;With upper extremity assist;From chair/3-in-1  Sit to Stand Details Verbal cues for sequencing;Verbal cues for technique  Sit to Stand Details (indicate cue type and reason) during TUG pt reaching out with his L arm to grab caregiver's arm to help come up to stand, reviewed proper sit <> stand technique (performed in manual clinic w/c), with cues to scoot out to the edge first, proper UE placement and nose over toes for incr forward lean - performed 3 total reps    Stand to Sit 4: Min assist;With upper extremity assist;To chair/3-in-1  Ambulation/Gait  Ambulation/Gait Yes  Ambulation/Gait Assistance 4: Min assist  Ambulation/Gait Assistance Details pt ambulating into clinic today with rollator, pt with incr freezing episodes, needing verbal and manual cues to stop and reset and weight shifting before taking a big step, pt with longer freezing episode when trying to go through doorway, once pt  gets through doorway and walks down the hallway, pt with short steps and speeds up gait, with cues to stay close to rollator and min A for safety. pt needing to sit in manual clinic w/c to be wheeled back to therapy gym due to fatigue   Ambulation Distance (Feet) 15 Feet  Assistive device Rollator  Gait Pattern Step-through pattern;Decreased step length - right;Decreased step length - left;Decreased dorsiflexion - right;Decreased dorsiflexion - left;Right foot flat;Left foot flat;Lateral trunk lean to right;Lateral trunk lean to left;Festinating;Shuffle  Ambulation Surface Level;Indoor  Gait Comments pt performs turns with festination/shuffled steps. verbally reviewed techniques to decr festination/freezing during gait               PT Education - 05/28/20 1637    Education Details initiated HEP, sit <> stand training    Person(s) Educated Patient;Caregiver(s)    Methods Explanation;Demonstration;Handout    Comprehension Verbalized understanding;Returned demonstration;Need further instruction            PT Short Term Goals - 05/21/20 1816      PT SHORT TERM GOAL #1   Title Pt will be HEP with caregiver/family assist for improved strength, balance, transfers and gait.  TARGET 06/18/2020    Time 4    Period Weeks    Status New      PT SHORT TERM GOAL #2   Title Pt will perform 4 of 5 transfers sit<>stand with min guard assist and no posterior lean, for improved ease and safety of trasnfers.    Baseline posterior lean, min assist and extra time    Time 4    Period Weeks    Status New      PT SHORT TERM GOAL #3   Title Pt and caregiver will verbalize understanding of tips to reduce freezing and festination with short distance household gait, including doorways and turning.    Time 4    Period Weeks    Status New      PT SHORT TERM GOAL #4   Title TUG score to be assessed and goal to be written as approrpiate.    Time 4    Period Weeks    Status New              PT Long Term Goals - 05/21/20 1819      PT LONG TERM GOAL #1   Title Pt will perform updated HEP to address strength, balance, transfers, gait for improved mobility and decreased fall risk.  TARGET 07/16/2020  Time 8    Period Weeks      PT LONG TERM GOAL #2   Title Pt will perform 8 of 10 reps of sit<>stand transfers with supervision, no posterior lean or LOB, for improved ease and safety with transfers.    Time 8    Period Weeks    Status New      PT LONG TERM GOAL #3   Title Pt will improve gait velocity to at least 1.6 ft/sec for decreased fall risk with household gait.    Time 8    Period Weeks    Status New      PT LONG TERM GOAL #4   Title Pt will verbalize plans for ongoing community fitness, including possible aquatic exercise as able to tolerate iwth lymphedema/wound hx, to maximize gains made in therapy.    Time 8    Period Weeks               05/30/20 1537  Plan  Clinical Impression Statement Pt ambulated into clinic with rollator - pt with a couple episodes of freezing, esp when going through a doorway - needing manual, verbal cues for proper techniques to stop and weight shift and to take a big step before continuing with gait. Pt unable to walk the distance back into the clinic and needing to be pushed back in manual clinic w/c. Performed the TUG today with pt performing in 2 minutes and 34 seconds, putting pt at a significant risk for falls. Initiated HEP today for seated BLE strengthening. Will continue to progress towards LTGs.  Personal Factors and Comorbidities Comorbidity 3+  Comorbidities PMH melanoma s/p amputation toes on L foot, HTN, hyperlipidemia, lymphedema  Examination-Activity Limitations Locomotion Level;Transfers;Stand  Examination-Participation Restrictions Community Activity;Other (Community exercise (PD spin class))  Pt will benefit from skilled therapeutic intervention in order to improve on the following deficits Abnormal gait;Difficulty  walking;Decreased safety awareness;Decreased balance;Decreased mobility;Decreased strength;Postural dysfunction  Stability/Clinical Decision Making Evolving/Moderate complexity  Rehab Potential Good  PT Frequency 1x / week (may need to recert/increase frequency when/if pt becomes candidate for aquatic therapy)  PT Duration 8 weeks (plus eval)  PT Treatment/Interventions ADLs/Self Care Home Management;Aquatic Therapy;Gait training;Functional mobility training;Therapeutic activities;Therapeutic exercise;Balance training;Neuromuscular re-education;Patient/family education  PT Next Visit Plan how was initial HEP? did pt get scheduled for OT/ST?; transfer training with safe, optimal technique for transfers; tips to reduce freezing with gait; can pt perform standing march/kicks at locked rollator to work on foot clearance (pt did not wear shoes due to lower extremity bandages, so I used shoe covers for his feet in clinic)  Consulted and Agree with Plan of Care Patient;Family member/caregiver  Family Member [employer], caregiver        Patient will benefit from skilled therapeutic intervention in order to improve the following deficits and impairments:     Visit Diagnosis: Other abnormalities of gait and mobility  Unsteadiness on feet  Muscle weakness (generalized)  Other symptoms and signs involving the nervous system     Problem List Patient Active Problem List   Diagnosis Date Noted  . Cellulitis 04/12/2020  . Strain of right biceps 09/12/2017  . Lymphedema 02/10/2017  . BPH associated with nocturia 05/16/2016  . Senile purpura ([place]) 05/03/2016  . OSA (obstructive sleep apnea) 12/28/2015  . Hypersomnia 11/15/2015  . Cough 11/15/2015  . 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 Dizziness 12/30/2014  . Diastolic dysfunction 41/93/7902  . Former smoker 04/29/2014  . Chronic venous insufficiency 02/20/2014  .  Hyperglycemia 08/02/2012  . Morbid obesity ([place]) 07/13/2010   . History of malignant melanoma. left leg 03/09/2009  . Insomnia 10/03/2007  . Hyperlipidemia 12/13/2006  . Essential hypertension 12/13/2006    Arliss Journey, PT, DPT  05/28/2020, 4:39 PM  [facility] [address] [place], [place], [address] Phone: [phone]   Fax:  [phone]  Name: DAEVION NAVARETTE MRN: [id number] Date of Birth: 1945/10/02

## 2020-05-28 NOTE — Patient Instructions (Signed)
Access Code: [email or web address] URL: [email or web address] Date: 05/28/2020 Prepared by: Janann August  Exercises Seated March - 2 x daily - 5 x weekly - 2 sets - 10 reps Seated Long Arc Quad - 2 x daily - 5 x weekly - 1 sets - 10 reps Seated Active Hip Flexion - 2 x daily - 5 x weekly - 2 sets - 10 reps Seated Heel Toe Raises - 2 x daily - 5 x weekly - 2 sets - 10 reps Seated Hip Adduction Isometrics with Ball - 2 x daily - 5 x weekly - 2 sets - 5 reps

## 2020-05-31 ENCOUNTER — Encounter (HOSPITAL_BASED_OUTPATIENT_CLINIC_OR_DEPARTMENT_OTHER): Payer: PPO | Admitting: Internal Medicine

## 2020-05-31 ENCOUNTER — Other Ambulatory Visit: Payer: Self-pay

## 2020-05-31 DIAGNOSIS — I890 Lymphedema, not elsewhere classified: Secondary | ICD-10-CM | POA: Diagnosis not present

## 2020-05-31 DIAGNOSIS — L97811 Non-pressure chronic ulcer of other part of right lower leg limited to breakdown of skin: Secondary | ICD-10-CM | POA: Diagnosis not present

## 2020-06-01 NOTE — Progress Notes (Signed)
Nicholas [name] ([id number]) , Visit Report for 05/31/2020 HPI Details Patient Name: Date of Service: Nicholas [name] DFO RD K. 05/31/2020 2:45 PM Medical Record Number: [id number] Patient Account Number: [id number] Date of Birth/Sex: Treating RN: 07-21-1946 (74 y.o. Nicholas [name] Primary Care Provider: [name] Other Clinician: Referring Provider: Treating Provider/Extender: [name] in Treatment: 20 History of Present Illness HPI Description: ADMISSION 07/04/2018 This is a 74 year old man who has bilateral lymphedema felt to be secondary to previous treatment for malignant melanoma on his left toes. 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 Malignant melanoma was treated with immunotherapy. He is not had radiation. Sometime after this he developed edema in the left leg that spread into the right leg. He has bilateral compression pumps at home and he uses them twice a day and claims to be quite compliant. He also has a juxta lite type stocking at home which is apparently 59-year-old. He states that he fell in early October dislocating and fracturing his left shoulder. He slept in a recliner with his legs dependent. He was seen and followed at the rehab clinic at [facility] and was having lymphedema wraps done by 1 of the PT who noted his open wounds last week and he has been referred here for management. The patient has 2 open wounds on the right s distal lower leg and one on the left anterior lower leg The patient is not a diabetic. 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 For one reason or another we could not get him set up with home health and he kept the wraps on all week which he generally seem to tolerate. He did not use his compression pumps over the top of the wraps which I told him he could do this week. He comes in with a new rash on both feet which looks like tinea 07/22/2018; much better edema control bilaterally. The wounds on the right lateral calf and left anterior lower leg both look a lot better. There is epithelialization over both surfaces but I think this is fragile and I am going to put him in our compression wraps for another week. He says he is been using his compression pumps at home. He has a constellation of lower extremity compression garments that he is going to bring in next week. In discussion with him I cannot really get a sense of everything he has. I think he was at one point prescribed 30-40 below-knee stockings but he points out he could not get them on. 07/29/2018; the patient arrives in his legs are healed. He has a constellation of stockings none of which I think are going to be that helpful except for the fact that he has external wraparound stockings of some description. He has above-knee stockings but I cannot imagine it would be possible to get these on READMISSION 01/08/2020 Patient is now a 74 year old man. We had him for a short period from December 2019 through January 2020. He has bilateral lower extremity lymphedema which he initially traces to treatment for malignant melanoma 7 years ago. He has been followed at the lymphedema clinic on  [address] and recently has compression pumps that go up to his mid abdomen. He recently developed superficial wounds bilaterally. They will not manage these in the lymphedema clinic where the services are run by therapy. He has not been using his compression pumps. His significant other cannot get the stockings on his legs and the patient is only limited ability to help because  of Parkinson's disease Past medical history is essentially unchanged. He has sleep apnea, Parkinson's disease at some point labeled his Parkinson's plus, bilateral lymphedema, history of malignant melanoma. He does not have an arterial issue that I am aware of although his previous arterial studies have been noncompressible 7/1; patient we admitted the clinic 2 weeks ago. He has severe bilateral lymphedema. He had wounds predominantly on the left leg but also superficially on the right. He has compression pumps which he says he is using once a day. 7/15; 2-week follow-up. He comes in for nurse visit. He has severe bilateral lymphedema. He has upper abdominal level compression pumps which she is using twice a day. We have been wrapping both his lower legs the areas on the right leg are healed. He still has an area of left upper lateral and right anterior medial. Anterior medial wound is small. He is tolerating this well. 7/22; still significant lymphedema even with 4-layer compression and external compression pump usage. He said he had some itching on the right anterior leg he comes in with a large superficial area of skin breakdown anteriorly and a smaller area laterally on the right leg. He also has a rash on the left dorsal ankle crease. Some of these look pustular. I wonder if this is an occlusion issue from foam we put down here. He also states the only thing different this week is the did a 5-hour car ride with his legs dependent. There was nothing open on the right leg last week but we wrapped it in any case 7/27; culture of the blisters on his left anterior leg grew staph aureus. I gave him doxycycline which he started yesterday. He is already appear cleaner and dry. His major wound is on the right anterior lower leg and the right lateral lower leg. There is nothing else open on the left leg. He has better edema control today. 03/04/20-Patient comes in with new areas after he had a fall, on his  left dorsal foot and right dorsal foot and left medial proximal leg, there is significant amount of drainage into his dressing that was removed today which is actually greenish in color. It looks like he is completed a course of doxycycline for staph that grew from his blisters however he might need antipseudomonal coverage at this point 8/16; apparently the wounds originally were all healed until his last visit when after a fall he had areas on the left dorsal foot, right dorsal foot and left medial proximal leg. T oday all of these have healed and he has a small area on the left medial ankle. The patient has severe bilateral lymphedema which is managed usually with compression stockings/external compression stockings as well his mid abdomen compression pumps. He is still using the latter twice a day. 8/23; the wounds on his dorsal feet and medial ankle that he had from a fall last time of closed however he has a new wrap injury on the left medial upper leg and a smaller 1 anteriorly. He is using his compression pumps twice a day 8/30; nothing open on the left leg. He is  in his stocking. Using his compression pumps twice a day. From last week still a small opening in the medial upper leg on the right he has a new area in the upper mid tibia. He thinks this might have been a wrap injury looks more like the remanent of the blister 9/7; the areas on the right anterior and right medial lower leg have both closed over. He came in for a nurse visit today but I looked at the right leg and indeed it is healed. His edema control is moderate but certainly a lot better than when I first saw this. He is using his compression pumps twice a day over his juxta lite stockings 9/14; the area on the right anterior and right medial lower legs closed over last week and we discharged him into his own external compression stocking. This is in accompaniment of abdominal level external compression pumps that he uses for 45  minutes twice a day. He states that the control lasted till about Friday he started to have weeping edema and then by Saturday things look like they were starting to breakdown. He arrives in clinic today with a large erythematous area on the right lateral calf with weeping edema fluid also on the medial and anterior ankle. 9/21; this is a patient I actually discharged from clinic 2 weeks ago into his own stocking and external compression pumps. He arrived last week with uncontrolled edema and weeping fluid from the right leg we. We put him back in compression he has a juxta lite stocking I believe on the left leg which does not have any open wounds. He arrives in clinic today weeping open wounds on the right lateral calf. Apparently some purulent drainage when he came in. Most impressive for diffuse very tender erythema extending up to the level of his tibial tuberosity. 9/27; patient I sent to the hospital last week with an intense cellulitis of the right leg. He was admitted from 9/20 through 04/15/2020 he received broad-spectrum IV antibiotics and was discharged on Keflex and ciprofloxacin. His leg is a lot better he only has 1 open wound on the right lateral lower leg 10/4; patient with severe bilateral lymphedema. He has recovered from the cellulitis of a few weeks ago at which time we had to send him to the emergency room.. We have also discharge this man in early September but he had a quick turnaround. He has abdominal level external compression pumps he uses these twice a day. He has Farrow wrap lower extremity stockings 11/1; this is a patient we discharged 3 weeks ago. He tells [place] that roughly 2 weeks ago he developed wounds on his right lower extremity 1 proximally and 1 distally. Not sure if these started as blisters. He relates he is now in the independent part of the [place] complex not sure if that had anything to do with this. He has his Farrow wraps and his abdominal level external  compression pumps which he says he is using twice a day. He comes in today with the left leg stocking on properly 11/8; patient developed blisters on his right leg which opened and I saw him last week. Again with compression these heal fairly easily. He is using his abdominal level compression pumps twice a day for 45 minutes and he has a juxta lite stocking. I do not know that we can do too much better than this unless he is willing to increase either the time or frequency of his compression pump usage.  Electronic Signature(s) Signed: 06/01/2020 3:11:24 PM By: Linton Ham MD Entered By: Linton Ham on 05/31/2020 17:35:10 -------------------------------------------------------------------------------- Physical Exam Details Patient Name: Date of Service: Nicholas [name] DFO RD K. 05/31/2020 2:45 PM Medical Record Number: [id number] Patient Account Number: [id number] Date of Birth/Sex: Treating RN: 1946/01/17 (74 y.o. Nicholas [name] Primary Care Provider: [name] Other Clinician: Referring Provider: Treating Provider/Extender: [name] in Treatment: 38 Constitutional Patient is hypertensive.. Pulse regular and within target range for patient.Marland Kitchen Respirations regular, non-labored and within target range.. Temperature is normal and within the target range for the patient.Marland Kitchen Appears in no distress. Notes Wound exam; the areas on the right anterior lower leg just above the ankle have both healed. He still has moderate amounts of lymphedema 3+. Degrees of chronic venous inflammation but there is no open wound. Electronic Signature(s) Signed: 06/01/2020 3:11:24 PM By: Linton Ham MD Entered By: Linton Ham on 05/31/2020 17:36:48 -------------------------------------------------------------------------------- Physician Orders Details Patient Name: Date of Service: Nicholas [name] DFO RD K. 05/31/2020 2:45 PM Medical Record Number: [id number] Patient  Account Number: [id number] Date of Birth/Sex: Treating RN: 1945-08-12 (74 y.o. [name] Primary Care Provider: [name] Other Clinician: Referring Provider: Treating Provider/Extender: [name] in Treatment: 20 Verbal / Phone Orders: No Diagnosis Coding Discharge From [facility] Discharge from [facility] - call if any future wound care needs. Skin Barriers/Peri-Wound Care Moisturizing lotion - daily Edema Control Avoid standing for long periods of time Elevate legs to the level of the heart or above for 30 minutes daily and/or when sitting, a frequency of: - throughout the day. Exercise regularly Support Garment 20-30 mm/Hg pressure to: - compression garments farrow wrap 4000 both legs. apply in the morning and remove at night. Segmental Compressive Device. - twice a day for an hour each time. Electronic Signature(s) Signed: 05/31/2020 5:46:03 PM By: Deon Pilling Signed: 06/01/2020 3:11:24 PM By: Linton Ham MD Entered By: Deon Pilling on 05/31/2020 16:10:28 -------------------------------------------------------------------------------- Problem List Details Patient Name: Date of Service: Nicholas [name] DFO RD K. 05/31/2020 2:45 PM Medical Record Number: [id number] Patient Account Number: [id number] Date of Birth/Sex: Treating RN: [birth year] (74 y.o. 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 05/31/2020 2:45 PM Medical Record Number: [id number] Patient Account Number: [id number] Date of Birth/Sex: Treating RN: [birth year] (74 y.o. Nicholas [name] Primary Care Provider: [name] Other Clinician: Referring Provider: Treating Provider/Extender: [name] in Treatment: 20 Subjective History of Present Illness (HPI) ADMISSION 07/04/2018 This is a 74 year old man who has bilateral lymphedema felt to be secondary to previous treatment for malignant melanoma on his left toes. 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 Malignant melanoma was treated  with immunotherapy. He is not had radiation. Sometime after this he developed edema in the left leg that spread into the right leg. He has bilateral compression pumps at home and he uses them twice a day and claims to be quite compliant. He also has a juxta lite type stocking at home which is apparently 81-year-old. He states that he fell in early October dislocating and fracturing his left shoulder. He slept in a recliner with his legs dependent. He was seen and followed at the rehab clinic at [facility] and was having lymphedema wraps done by 1 of the PT who noted his open wounds last week and he has been referred here for management. The patient has 2 open wounds on the right s distal lower leg and one on the left anterior lower leg The patient is not a diabetic. 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 For one reason or another we could not get him set up with home health and he kept the wraps on all week which he generally seem to tolerate. He did not use his compression pumps over the top of the wraps which I told him he could do this week. He comes in with a new rash on both feet which looks like tinea 07/22/2018; much better edema control bilaterally. The wounds on the right lateral calf and left anterior lower leg both look a lot better. There is epithelialization over both surfaces but I think this is fragile and I am going to put him in our compression wraps for another week. He says he is been using his compression pumps at home. ooHe has a constellation of lower extremity compression garments that he is going to bring in next week. In discussion with him I cannot really get a sense of everything he has. I think he was at one point prescribed 30-40 below-knee stockings but he points out he could not get them on. 07/29/2018; the patient arrives in his legs are healed. He has a constellation of stockings none of which I think are going to be that helpful except for the fact that he has external wraparound stockings of some description. He has above-knee stockings but I cannot imagine it would be possible to get these on READMISSION 01/08/2020 Patient is now a 74 year old man. We had him for a short period from December 2019 through January 2020. He has bilateral lower extremity lymphedema which he initially traces to treatment for malignant melanoma 7 years ago. He has been followed at the lymphedema clinic on [facility] and recently has compression pumps that go up to his mid abdomen. He recently developed  superficial wounds bilaterally. They will not manage these in the lymphedema clinic where the services are run by therapy. He has not been using his compression pumps. His significant other cannot get the stockings on his legs and the patient is only limited ability to help because of Parkinson's disease Past medical history is essentially unchanged. He has sleep apnea, Parkinson's disease at some point labeled his Parkinson's plus, bilateral lymphedema, history of malignant melanoma. He does not have an arterial issue that I am aware of although his previous arterial studies have been noncompressible 7/1;  patient we admitted the clinic 2 weeks ago. He has severe bilateral lymphedema. He had wounds predominantly on the left leg but also superficially on the right. He has compression pumps which he says he is using once a day. 7/15; 2-week follow-up. He comes in for nurse visit. He has severe bilateral lymphedema. He has upper abdominal level compression pumps which she is using twice a day. We have been wrapping both his lower legs the areas on the right leg are healed. He still has an area of left upper lateral and right anterior medial. Anterior medial wound is small. He is tolerating this well. 7/22; still significant lymphedema even with 4-layer compression and external compression pump usage. He said he had some itching on the right anterior leg he comes in with a large superficial area of skin breakdown anteriorly and a smaller area laterally on the right leg. He also has a rash on the left dorsal ankle crease. Some of these look pustular. I wonder if this is an occlusion issue from foam we put down here. He also states the only thing different this week is the did a 5-hour car ride with his legs dependent. There was nothing open on the right leg last week but we wrapped it in any case 7/27; culture of the blisters on his left anterior leg grew staph aureus. I gave him doxycycline which he  started yesterday. He is already appear cleaner and dry. His major wound is on the right anterior lower leg and the right lateral lower leg. There is nothing else open on the left leg. He has better edema control today. 03/04/20-Patient comes in with new areas after he had a fall, on his left dorsal foot and right dorsal foot and left medial proximal leg, there is significant amount of drainage into his dressing that was removed today which is actually greenish in color. It looks like he is completed a course of doxycycline for staph that grew from his blisters however he might need antipseudomonal coverage at this point 8/16; apparently the wounds originally were all healed until his last visit when after a fall he had areas on the left dorsal foot, right dorsal foot and left medial proximal leg. T oday all of these have healed and he has a small area on the left medial ankle. The patient has severe bilateral lymphedema which is managed usually with compression stockings/external compression stockings as well his mid abdomen compression pumps. He is still using the latter twice a day. 8/23; the wounds on his dorsal feet and medial ankle that he had from a fall last time of closed however he has a new wrap injury on the left medial upper leg and a smaller 1 anteriorly. He is using his compression pumps twice a day 8/30; nothing open on the left leg. He is in his stocking. Using his compression pumps twice a day. From last week still a small opening in the medial upper leg on the right he has a new area in the upper mid tibia. He thinks this might have been a wrap injury looks more like the remanent of the blister 9/7; the areas on the right anterior and right medial lower leg have both closed over. He came in for a nurse visit today but I looked at the right leg and indeed it is healed. His edema control is moderate but certainly a lot better than when I first saw this. He is using his compression pumps  twice a day  over his juxta lite stockings 9/14; the area on the right anterior and right medial lower legs closed over last week and we discharged him into his own external compression stocking. This is in accompaniment of abdominal level external compression pumps that he uses for 45 minutes twice a day. He states that the control lasted till about Friday he started to have weeping edema and then by Saturday things look like they were starting to breakdown. He arrives in clinic today with a large erythematous area on the right lateral calf with weeping edema fluid also on the medial and anterior ankle. 9/21; this is a patient I actually discharged from clinic 2 weeks ago into his own stocking and external compression pumps. He arrived last week with uncontrolled edema and weeping fluid from the right leg we. We put him back in compression he has a juxta lite stocking I believe on the left leg which does not have any open wounds. ooHe arrives in clinic today weeping open wounds on the right lateral calf. Apparently some purulent drainage when he came in. Most impressive for diffuse very tender erythema extending up to the level of his tibial tuberosity. 9/27; patient I sent to the hospital last week with an intense cellulitis of the right leg. He was admitted from 9/20 through 04/15/2020 he received broad-spectrum IV antibiotics and was discharged on Keflex and ciprofloxacin. His leg is a lot better he only has 1 open wound on the right lateral lower leg 10/4; patient with severe bilateral lymphedema. He has recovered from the cellulitis of a few weeks ago at which time we had to send him to the emergency room.. We have also discharge this man in early September but he had a quick turnaround. He has abdominal level external compression pumps he uses these twice a day. He has Farrow wrap lower extremity stockings 11/1; this is a patient we discharged 3 weeks ago. He tells [place] that roughly 2 weeks ago  he developed wounds on his right lower extremity 1 proximally and 1 distally. Not sure if these started as blisters. He relates he is now in the independent part of the [place] complex not sure if that had anything to do with this. He has his Farrow wraps and his abdominal level external compression pumps which he says he is using twice a day. He comes in today with the left leg stocking on properly 11/8; patient developed blisters on his right leg which opened and I saw him last week. Again with compression these heal fairly easily. He is using his abdominal level compression pumps twice a day for 45 minutes and he has a juxta lite stocking. I do not know that we can do too much better than this unless he is willing to increase either the time or frequency of his compression pump usage. Objective Constitutional Patient is hypertensive.. Pulse regular and within target range for patient.Marland Kitchen Respirations regular, non-labored and within target range.. Temperature is normal and within the target range for the patient.Marland Kitchen Appears in no distress. Vitals Time Taken: 3:35 PM, Height: 67 in, Weight: 250 lbs, BMI: 39.2, Temperature: 97.7 F, Pulse: 86 bpm, Respiratory Rate: 20 breaths/min, Blood Pressure: 157/88 mmHg. General Notes: Wound exam; the areas on the right anterior lower leg just above the ankle have both healed. He still has moderate amounts of lymphedema 3+. Degrees of chronic venous inflammation but there is no open wound. Integumentary (Hair, Skin) Wound #21 status is Healed - Epithelialized. Original cause of wound  was Gradually Appeared. The wound is located on the Right,Lateral Lower Leg. The wound measures 0cm length x 0cm width x 0cm depth; 0cm^2 area and 0cm^3 volume. Wound #22 status is Healed - Epithelialized. Original cause of wound was Gradually Appeared. The wound is located on the Right,Distal,Lateral Lower Leg. The wound measures 0cm length x 0cm width x 0cm depth; 0cm^2 area  and 0cm^3 volume. Assessment Active Problems ICD-10 Lymphedema, not elsewhere classified Non-pressure chronic ulcer of other part of right lower leg limited to breakdown of skin Plan Discharge From [facility] Services: Discharge from [place] - call if any future wound care needs. Skin Barriers/Peri-Wound Care: Moisturizing lotion - daily Edema Control: Avoid standing for long periods of time Elevate legs to the level of the heart or above for 30 minutes daily and/or when sitting, a frequency of: - throughout the day. Exercise regularly Support Garment 20-30 mm/Hg pressure to: - compression garments farrow wrap 4000 both legs. apply in the morning and remove at night. Segmental Compressive Device. - twice a day for an hour each time. 1. The external compression pumps and juxta lite stockings are really as good as we can do with for this man. 2. If he does not control the swelling in these legs the skin is going to open over and over again. 3. At this point there does not seem to be a lymphedema clinic and we can return the patient to Electronic Signature(s) Signed: 06/01/2020 3:11:24 PM By: Linton Ham MD Signed: 06/01/2020 3:11:24 PM By: Linton Ham MD Entered By: Linton Ham on 05/31/2020 17:37:36 -------------------------------------------------------------------------------- SuperBill Details Patient Name: Date of Service: Nicholas [name] DFO RD K. 05/31/2020 Medical Record Number: [id number] Patient Account Number: [id number] Date of Birth/Sex: Treating RN: May 08, 1946 (74 y.o. [name] Primary Care Provider: [name] Other Clinician: Referring Provider: Treating Provider/Extender: [name] in Treatment: 20 Diagnosis Coding ICD-10 Codes Code Description I89.0 Lymphedema, not elsewhere classified L97.811 Non-pressure chronic ulcer of other part of right lower leg limited to breakdown of skin Facility Procedures CPT4  Code: 40981191 Description: 99214 - WOUND CARE VISIT-LEV 4 EST PT Modifier: Quantity: 1 Physician Procedures : CPT4 Code Description Modifier [id number] - WC PHYS LEVEL 3 - EST PT ICD-10 Diagnosis Description I89.0 Lymphedema, not elsewhere classified L97.811 Non-pressure chronic ulcer of other part of right lower leg limited to breakdown of skin Quantity: 1 Electronic Signature(s) Signed: 06/01/2020 3:11:24 PM By: Linton Ham MD Entered By: Linton Ham on 05/31/2020 17:37:49

## 2020-06-01 NOTE — Progress Notes (Signed)
[name] ([id number]) , Visit Report for 05/31/2020 Arrival Information Details Patient Name: Date of Service: Nicholas [name]. 05/31/2020 2:45 PM Medical Record Number: [id number] Patient Account Number: [id number] Date of Birth/Sex: Treating RN: [birth year] (74 y.o. Nicholas [name], Meta.Reding Primary Care Irena Gaydos: [name] Other Clinician: Referring Shaleena Crusoe: Treating Envy Meno/Extender: [name] in Treatment: 24 Visit Information History Since Last Visit Added or deleted any medications: No Patient Arrived: Wheel Chair Any new allergies or adverse reactions: No Arrival Time: 15:35 Had a fall or experienced change in No Accompanied By: wife activities of daily living that may affect Transfer Assistance: None risk of falls: Patient Identification Verified: Yes Signs or symptoms of abuse/neglect since last visito No Secondary Verification Process Completed: Yes Hospitalized since last visit: No Patient Requires Transmission-Based Precautions: No Implantable device outside of the clinic excluding No Patient Has Alerts: Yes cellular tissue based products placed in the center Patient Alerts: ABI: [place] 07/2018 since last visit: Has Dressing in Place as Prescribed: Yes Has Compression in Place as Prescribed: Yes Pain Present Now: No Electronic Signature(s) Signed: 05/31/2020 5:46:03 PM By: Deon Pilling Entered By: Deon Pilling on 05/31/2020 15:42:41 -------------------------------------------------------------------------------- Clinic Level of Care Assessment Details Patient Name: Date of Service: Nicholas [name]. 05/31/2020 2:45 PM Medical Record Number: [id number] Patient Account Number: [id number] Date of Birth/Sex: Treating RN: 12-22-1945 (74 y.o. Nicholas [name], Meta.Reding Primary Care Whit Bruni: [name] Other Clinician: Referring Jennaya Pogue: Treating Jimmie Dattilio/Extender: [name] in Treatment: 20 Clinic Level  of Care Assessment Items TOOL 4 Quantity Score X- 1 0 Use when only an EandM is performed on FOLLOW-UP visit ASSESSMENTS - Nursing Assessment / Reassessment X- 1 10 Reassessment of Co-morbidities (includes updates in patient status) X- 1 5 Reassessment of Adherence to Treatment Plan ASSESSMENTS - Wound and Skin A ssessment / Reassessment []  - 0 Simple Wound Assessment / Reassessment - one wound X- 2 5 Complex Wound Assessment / Reassessment - multiple wounds X- 1 10 Dermatologic / Skin Assessment (not related to wound area) ASSESSMENTS - Focused Assessment []  - 0 Circumferential Edema Measurements - multi extremities X- 1 10 Nutritional Assessment / Counseling / Intervention []  - 0 Lower Extremity Assessment (monofilament, tuning fork, pulses) []  - 0 Peripheral Arterial Disease Assessment (using hand held doppler) ASSESSMENTS - Ostomy and/or Continence Assessment and Care []  - 0 Incontinence Assessment and Management []  - 0 Ostomy Care Assessment and Management (repouching, etc.) PROCESS - Coordination of Care []  - 0 Simple Patient / Family Education for ongoing care X- 1 20 Complex (extensive) Patient / Family Education for ongoing care X- 1 10 Staff obtains Programmer, systems, Records, T Results / Process Orders est []  - 0 Staff telephones HHA, Nursing Homes / Clarify orders / etc []  - 0 Routine Transfer to another Facility (non-emergent condition) []  - 0 Routine Hospital Admission (non-emergent condition) []  - 0 New Admissions / Biomedical engineer / Ordering NPWT Apligraf, etc. , []  - 0 Emergency Hospital Admission (emergent condition) []  - 0 Simple Discharge Coordination X- 1 15 Complex (extensive) Discharge Coordination PROCESS - Special Needs []  - 0 Pediatric / Minor Patient Management []  - 0 Isolation Patient Management []  - 0 Hearing / Language / Visual special needs []  - 0 Assessment of Community assistance (transportation, D/C planning, etc.) []  -  0 Additional assistance / Altered mentation []  - 0 Support Surface(s) Assessment (bed, cushion, seat, etc.) INTERVENTIONS - Wound Cleansing / Measurement []  - 0  Simple Wound Cleansing - one wound X- 2 5 Complex Wound Cleansing - multiple wounds X- 1 5 Wound Imaging (photographs - any number of wounds) []  - 0 Wound Tracing (instead of photographs) []  - 0 Simple Wound Measurement - one wound X- 2 5 Complex Wound Measurement - multiple wounds INTERVENTIONS - Wound Dressings []  - 0 Small Wound Dressing one or multiple wounds []  - 0 Medium Wound Dressing one or multiple wounds []  - 0 Large Wound Dressing one or multiple wounds []  - 0 Application of Medications - topical []  - 0 Application of Medications - injection INTERVENTIONS - Miscellaneous []  - 0 External ear exam []  - 0 Specimen Collection (cultures, biopsies, blood, body fluids, etc.) []  - 0 Specimen(s) / Culture(s) sent or taken to Lab for analysis []  - 0 Patient Transfer (multiple staff / Civil Service fast streamer / Similar devices) []  - 0 Simple Staple / Suture removal (25 or less) []  - 0 Complex Staple / Suture removal (26 or more) []  - 0 Hypo / Hyperglycemic Management (close monitor of Blood Glucose) []  - 0 Ankle / Brachial Index (ABI) - do not check if billed separately X- 1 5 Vital Signs Has the patient been seen at the hospital within the last three years: Yes Total Score: 120 Level Of Care: New/Established - Level 4 Electronic Signature(s) Signed: 05/31/2020 5:46:03 PM By: Deon Pilling Entered By: Deon Pilling on 05/31/2020 16:29:43 -------------------------------------------------------------------------------- Encounter Discharge Information Details Patient Name: Date of Service: Nicholas [name]. 05/31/2020 2:45 PM Medical Record Number: [id number] Patient Account Number: [id number] Date of Birth/Sex: Treating RN: 02-16-1946 (74 y.o. [name] Primary Care Elbia Paro: [name] Other  Clinician: Referring Makaleigh Reinard: Treating Caitlyn Buchanan/Extender: [name] in Treatment: 20 Encounter Discharge Information Items Discharge Condition: Stable Ambulatory Status: Wheelchair Discharge Destination: Home Transportation: Private Auto Accompanied By: wife Schedule Follow-up Appointment: No Clinical Summary of Care: Electronic Signature(s) Signed: 05/31/2020 5:46:03 PM By: Deon Pilling Entered By: Deon Pilling on 05/31/2020 16:30:30 -------------------------------------------------------------------------------- Multi Wound Chart Details Patient Name: Date of Service: Nicholas [name]. 05/31/2020 2:45 PM Medical Record Number: [id number] Patient Account Number: [id number] Date of Birth/Sex: Treating RN: 06-Nov-1945 (74 y.o. [name] Primary Care Marijah Larranaga: [name] Other Clinician: Referring Mayleigh Tetrault: Treating Heinrich Fertig/Extender: [name] in Treatment: 20 Vital Signs Height(in): 67 Pulse(bpm): 86 Weight(lbs): 250 Blood Pressure(mmHg): 157/88 Body Mass Index(BMI): 39 Temperature(F): 97.7 Respiratory Rate(breaths/min): 20 Photos: [21:No Photos Right, Lateral Lower Leg] [22:No Photos Right, Distal, Lateral Lower Leg] [N/A:N/A N/A] Wound Location: [21:Gradually Appeared] [22:Gradually Appeared] [N/A:N/A] Wounding Event: [21:Lymphedema] [22:Lymphedema] [N/A:N/A] Primary Etiology: [21:05/19/2020] [22:05/19/2020] [N/A:N/A] Date Acquired: [21:1] [22:1] [N/A:N/A] Weeks of Treatment: [21:Healed - Epithelialized] [22:Healed - Epithelialized] [N/A:N/A] Wound Status: [21:0x0x0] [22:0x0x0] [N/A:N/A] Measurements L x W x D (cm) [21:0] [22:0] [N/A:N/A] A (cm) : rea [21:0] [22:0] [N/A:N/A] Volume (cm) : [21:100.00%] [22:100.00%] [N/A:N/A] % Reduction in Area: [21:100.00%] [22:100.00%] [N/A:N/A] % Reduction in Volume: [21:Full Thickness Without Exposed] [22:Full Thickness Without Exposed]  [N/A:N/A] Classification: [21:Support Structures] [22:Support Structures] Treatment Notes Electronic Signature(s) Signed: 05/31/2020 6:16:40 PM By: Levan Hurst RN, BSN Signed: 06/01/2020 3:11:24 PM By: Linton Ham MD Entered By: Linton Ham on 05/31/2020 17:34:08 -------------------------------------------------------------------------------- Multi-Disciplinary Care Plan Details Patient Name: Date of Service: Nicholas [name]. 05/31/2020 2:45 PM Medical Record Number: [id number] Patient Account Number: [id number] Date of Birth/Sex: Treating RN: 1945/12/20 (74 y.o. [name] Primary Care Seven Dollens: [name] Other Clinician: Referring Rheda Kassab:  Treating Montrez Marietta/Extender: [name] in Treatment: 20 Active Inactive Electronic Signature(s) Signed: 05/31/2020 5:46:03 PM By: Deon Pilling Entered By: Deon Pilling on 05/31/2020 16:28:35 -------------------------------------------------------------------------------- Pain Assessment Details Patient Name: Date of Service: Nicholas [name]. 05/31/2020 2:45 PM Medical Record Number: [id number] Patient Account Number: [id number] Date of Birth/Sex: Treating RN: [birth year] (74 y.o. [name] Primary Care Jamelyn Bovard: [name] Other Clinician: Referring Sharif Rendell: Treating Nakeem Murnane/Extender: [name] in Treatment: 20 Active Problems Location of Pain Severity and Description of Pain Patient Has Paino No Site Locations Rate the pain. Rate the pain. Current Pain Level: 0 Pain Management and Medication Current Pain Management: Medication: No Cold Application: No Rest: No Massage: No Activity: No T.E.N.S.: No Heat Application: No Leg drop or elevation: No Is the Current Pain Management Adequate: Adequate How does your wound impact your activities of daily livingo Sleep: No Bathing: No Appetite: No Relationship With Others: No Bladder  Continence: No Emotions: No Bowel Continence: No Work: No Toileting: No Drive: No Dressing: No Hobbies: No Electronic Signature(s) Signed: 05/31/2020 5:46:03 PM By: Deon Pilling Entered By: Deon Pilling on 05/31/2020 16:09:05 -------------------------------------------------------------------------------- Patient/Caregiver Education Details Patient Name: Date of Service: Nicholas [name]. 11/8/2021andnbsp2:45 PM Medical Record Number: [id number] Patient Account Number: [id number] Date of Birth/Gender: Treating RN: [birth year] (74 y.o. [name] Primary Care Physician: [name] Other Clinician: Referring Physician: Treating Physician/Extender: [name] in Treatment: 20 Education Assessment Education Provided To: Patient and Caregiver Education Topics Provided Venous: Handouts: Controlling Swelling with Compression Stockings Methods: Explain/Verbal Responses: Reinforcements needed Electronic Signature(s) Signed: 05/31/2020 5:46:03 PM By: Deon Pilling Entered By: Deon Pilling on 05/31/2020 16:28:51 -------------------------------------------------------------------------------- Wound Assessment Details Patient Name: Date of Service: Nicholas [name]. 05/31/2020 2:45 PM Medical Record Number: [id number] Patient Account Number: [id number] Date of Birth/Sex: Treating RN: 06-24-1946 (74 y.o. [name] Primary Care Fiona Coto: [name] Other Clinician: Referring Zakir Henner: Treating Emmersen Garraway/Extender: [name] in Treatment: 20 Wound Status Wound Number: 21 Primary Etiology: Lymphedema Wound Location: Right, Lateral Lower Leg Wound Status: Healed - Epithelialized Wounding Event: Gradually Appeared Date Acquired: 05/19/2020 Weeks Of Treatment: 1 Clustered Wound: No Photos Photo Uploaded By: Mikeal Hawthorne on 06/01/2020 11:27:42 Wound Measurements Length: (cm) Width:  (cm) Depth: (cm) Area: (cm) Volume: (cm) 0 % Reduction in Area: 100% 0 % Reduction in Volume: 100% 0 0 0 Wound Description Classification: Full Thickness Without Exposed Support Structur es Electronic Signature(s) Signed: 05/31/2020 5:46:03 PM By: Deon Pilling Entered By: Deon Pilling on 05/31/2020 15:43:29 -------------------------------------------------------------------------------- Wound Assessment Details Patient Name: Date of Service: Nicholas [name]. 05/31/2020 2:45 PM Medical Record Number: [id number] Patient Account Number: [id number] Date of Birth/Sex: Treating RN: [birth year] (74 y.o. [name] Primary Care Timika Muench: [name] Other Clinician: Referring Jovan Schickling: Treating Jerimie Mancuso/Extender: [name] in Treatment: 20 Wound Status Wound Number: 22 Primary Etiology: Lymphedema Wound Location: Right, Distal, Lateral Lower Leg Wound Status: Healed - Epithelialized Wounding Event: Gradually Appeared Date Acquired: 05/19/2020 Weeks Of Treatment: 1 Clustered Wound: No Photos Photo Uploaded By: Mikeal Hawthorne on 06/01/2020 11:27:42 Wound Measurements Length: (cm) Width: (cm) Depth: (cm) Area: (cm) Volume: (cm) 0 % Reduction in Area: 100% 0 % Reduction in Volume: 100% 0 0 0 Wound Description Classification: Full Thickness Without Exposed Support Structur es Electronic Signature(s) Signed: 05/31/2020 5:46:03 PM By: Deon Pilling Entered By: Deon Pilling on  05/31/2020 15:43:30 -------------------------------------------------------------------------------- Vitals Details Patient Name: Date of Service: Nicholas [name]. 05/31/2020 2:45 PM Medical Record Number: [id number] Patient Account Number: [id number] Date of Birth/Sex: Treating RN: January 16, 1946 (74 y.o. [name] Primary Care Shelagh Rayman: [name] Other Clinician: Referring Arneta Mahmood: Treating Kinda Pottle/Extender: [name] in Treatment: 20 Vital Signs Time Taken: 15:35 Temperature (F): 97.7 Height (in): 67 Pulse (bpm): 86 Weight (lbs): 250 Respiratory Rate (breaths/min): 20 Body Mass Index (BMI): 39.2 Blood Pressure (mmHg): 157/88 Reference Range: 80 - 120 mg / dl Electronic Signature(s) Signed: 05/31/2020 5:46:03 PM By: Deon Pilling Entered By: Deon Pilling on 05/31/2020 15:42:55

## 2020-06-02 ENCOUNTER — Ambulatory Visit: Payer: PPO | Admitting: Physical Therapy

## 2020-06-02 ENCOUNTER — Other Ambulatory Visit: Payer: Self-pay

## 2020-06-02 DIAGNOSIS — R2689 Other abnormalities of gait and mobility: Secondary | ICD-10-CM | POA: Diagnosis not present

## 2020-06-02 DIAGNOSIS — M6281 Muscle weakness (generalized): Secondary | ICD-10-CM

## 2020-06-02 DIAGNOSIS — R29818 Other symptoms and signs involving the nervous system: Secondary | ICD-10-CM

## 2020-06-02 DIAGNOSIS — R2681 Unsteadiness on feet: Secondary | ICD-10-CM

## 2020-06-02 NOTE — Patient Instructions (Signed)

## 2020-06-02 NOTE — Therapy (Addendum)
[facility] [address] [place], [place], [address] Phone: [phone]   Fax:  [phone]  Physical Therapy Treatment  Patient Details  Name: Nicholas [name] MRN: [id number] Date of Birth: Feb 28, 1946 Referring Provider (PT): [name], DO   Encounter Date: 06/02/2020   PT End of Session - 06/02/20 1513    Visit Number 3    Number of Visits 9    Date for PT Re-Evaluation 07/20/20    Authorization Type HTA, VL:  [place] No auth    Progress Note Due on Visit 10    PT Start Time 8416   pt arrived late   PT Stop Time 1405    PT Time Calculation (min) 43 min    Equipment Utilized During Treatment Gait belt    Activity Tolerance Patient tolerated treatment well    Behavior During Therapy WFL for tasks assessed/performed           Past Medical History:  Diagnosis Date  . Cancer [facility]) Jan 2008   skin; hx of melanoma left foot/ amputation of toes 1&2   . Hyperlipidemia   . Hypertension   . Lymphedema     Past Surgical History:  Procedure Laterality Date  . 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 removal of melanoma of left foot/amputation of toes 1&25 Jul 2006  . WRIST FRACTURE SURGERY     74 years old-set    There were no vitals filed for this visit.   Subjective Assessment - 06/02/20 1329    Subjective No falls. Has been doing his exercises. Feels good about them.    Patient is accompained by: --   caregiver, Gerald Stabs   Pertinent History PMH melanoma s/p amputation toes on L foot, HTN, hyperlipidemia, lymphedema    Limitations Walking;Standing;House hold activities    Patient Stated Goals Pt's goals for therapy are to get back in the pool for exercise    Currently in Pain? No/denies                       Outpatient [facility]02/[facility]  Lymphedema Life Impact Scale Total Score 14.71 %            [facility] Adult PT Treatment/Exercise -  06/02/20 1521      Transfers   Transfers Sit to Stand;Stand to Sit    Sit to Stand 4: Min assist;With upper extremity assist;From chair/3-in-1    Sit to Stand Details Verbal cues for sequencing;Verbal cues for technique;Tactile cues for posture;Tactile cues for placement;Verbal cues for safe use of DME/AE    Sit to Stand Details (indicate cue type and reason) from mat table, cues to use one hand on rollator and one hand on mat table, cues to scoot out towards edge and get feet under patient. cues for incr forward lean with pt at times taking incr time to stand, cues to use momentum at times to come to stand  and counting to 3, but pt having decr forward lean performing 5 reps and additional reps throughout session     Stand to Sit 4: Min assist;With upper extremity assist;To chair/3-in-1    Stand to Sit Details cues to back up and feel table and reach behind before sitting down      Ambulation/Gait   Ambulation/Gait Yes    Ambulation/Gait Assistance 4: Min assist    Ambulation/Gait Assistance Details pt ambulating into clinic with rollator today, with  freezing/festinating episodes during gait. Needing manual, verbal, and tactile cues to STOP and stand tall and weight shift before taking a larger step. Pt able to maintain larger steps for a couple of steps before reverting back to more of a freezing/festinating pattern. Pt needing consistent cues to STOP when he would get into this pattern with gait.    Ambulation Distance (Feet) 80 Feet   x1, 115 x1 and walking out of clinic   Assistive device Rollator    Gait Pattern Step-through pattern;Decreased step length - right;Decreased step length - left;Decreased dorsiflexion - right;Decreased dorsiflexion - left;Right foot flat;Left foot flat;Lateral trunk lean to right;Lateral trunk lean to left;Festinating;Shuffle    Ambulation Surface Level;Indoor      Therapeutic Activites    Therapeutic Activities Other Therapeutic Activities    Other Therapeutic  Activities discussed bringing pt's rollator to [employer] (where it was purchased) to have brakes tightened. educated pt and caregiver and provided handout on tips and strategies for freezing episodes to incr safety and decr risk of falls      Neuro Re-ed    Neuro Re-ed Details  standing at locked rollator (needing to use clinic rollator due to pt's rollator not having proper brakes) performing alternating marching x10 reps each leg (cues for breathing) and standing weight shifting x10 reps B                  PT Education - 06/02/20 1513    Education Details tips to reduce freezing episodes and to decr fall risk    Person(s) Educated Patient;Caregiver(s)    Methods Explanation;Demonstration;Tactile cues;Verbal cues;Handout    Comprehension Verbalized understanding;Returned demonstration;Verbal cues required;Tactile cues required;Need further instruction            PT Short Term Goals - 05/21/20 1816      PT SHORT TERM GOAL #1   Title Pt will be HEP with caregiver/family assist for improved strength, balance, transfers and gait.  TARGET 06/18/2020    Time 4    Period Weeks    Status New      PT SHORT TERM GOAL #2   Title Pt will perform 4 of 5 transfers sit<>stand with min guard assist and no posterior lean, for improved ease and safety of trasnfers.    Baseline posterior lean, min assist and extra time    Time 4    Period Weeks    Status New      PT SHORT TERM GOAL #3   Title Pt and caregiver will verbalize understanding of tips to reduce freezing and festination with short distance household gait, including doorways and turning.    Time 4    Period Weeks    Status New      PT SHORT TERM GOAL #4   Title TUG score to be assessed and goal to be written as approrpiate.    Time 4    Period Weeks    Status New             PT Long Term Goals - 05/21/20 1819      PT LONG TERM GOAL #1   Title Pt will perform updated HEP to address strength,  balance, transfers, gait for improved mobility and decreased fall risk.  TARGET 07/16/2020    Time 8    Period Weeks      PT LONG TERM GOAL #2   Title Pt will perform 8 of 10 reps of sit<>stand transfers with supervision, no posterior lean or  LOB, for improved ease and safety with transfers.    Time 8    Period Weeks    Status New      PT LONG TERM GOAL #3   Title Pt will improve gait velocity to at least 1.6 ft/sec for decreased fall risk with household gait.    Time 8    Period Weeks    Status New      PT LONG TERM GOAL #4   Title Pt will verbalize plans for ongoing community fitness, including possible aquatic exercise as able to tolerate iwth lymphedema/wound hx, to maximize gains made in therapy.    Time 8    Period Weeks                 Plan - 06/02/20 1517    Clinical Impression Statement Pt ambulated into clinic again today with rollator - pt has episodes of freezing/festination of gait, putting pt at an increased risk for falls. Provided handout with pt and reviewed with caregiver techniques to help with episodes and to decr fall risk. During gait, pt needing verbal, manual, and tactile cues in order to reset and weight shift before taking a big step. PT needing to cue pt multiple times to stop when festinating/freezing. Pt and caregiver will need additional practice to incr safety during gait and decr fall risk. Will continue to progress towards LTGs.    Personal Factors and Comorbidities Comorbidity 3+    Comorbidities PMH melanoma s/p amputation toes on L foot, HTN, hyperlipidemia, lymphedema    Examination-Activity Limitations Locomotion Level;Transfers;Stand    Examination-Participation Restrictions Community Activity;Other   Community exercise (PD spin class)   Stability/Clinical Decision Making Evolving/Moderate complexity    Rehab Potential Good    PT Frequency 1x / week   may need to recert/increase frequency when/if pt becomes candidate for aquatic therapy    PT Duration 8 weeks   plus eval   PT Treatment/Interventions ADLs/Self Care Home Management;Aquatic Therapy;Gait training;Functional mobility training;Therapeutic activities;Therapeutic exercise;Balance training;Neuromuscular re-education;Patient/family education    PT Next Visit Plan transfer training with safe, optimal technique for transfers; tips to reduce freezing with gait; can pt perform standing march/kicks at locked rollator to work on foot clearance (pt did not wear shoes due to lower extremity bandages, so I used shoe covers for his feet in clinic)    Consulted and Agree with Plan of Care Patient;Family member/caregiver    Family Member [employer], caregiver           Patient will benefit from skilled therapeutic intervention in order to improve the following deficits and impairments:  Abnormal gait, Difficulty walking, Decreased safety awareness, Decreased balance, Decreased mobility, Decreased strength, Postural dysfunction  Visit Diagnosis: Other abnormalities of gait and mobility  Muscle weakness (generalized)  Unsteadiness on feet  Other symptoms and signs involving the nervous system     Problem List Patient Active Problem List   Diagnosis Date Noted  . Cellulitis 04/12/2020  . Strain of right biceps 09/12/2017  . Lymphedema 02/10/2017  . BPH associated with nocturia 05/16/2016  . Senile purpura ([place]) 05/03/2016  . OSA (obstructive sleep apnea) 12/28/2015  . Hypersomnia 11/15/2015  . Cough 11/15/2015  . 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 Dizziness 12/30/2014  . Diastolic dysfunction 15/40/0867  . Former smoker 04/29/2014  . Chronic venous insufficiency 02/20/2014  . Hyperglycemia 08/02/2012  . Morbid obesity ([place]) 07/13/2010  . History of malignant melanoma. left leg 03/09/2009  . Insomnia 10/03/2007  . Hyperlipidemia 12/13/2006  .  Essential hypertension 12/13/2006    Arliss Journey, PT, DPT  06/02/2020, 3:28 PM  [facility] [address] [place], [place], [address] Phone: [phone]   Fax:  [phone]  Name: Nicholas [name] MRN: [id number] Date of Birth: 1945/10/25

## 2020-06-08 ENCOUNTER — Other Ambulatory Visit: Payer: Self-pay | Admitting: Family Medicine

## 2020-06-09 ENCOUNTER — Other Ambulatory Visit: Payer: Self-pay

## 2020-06-09 ENCOUNTER — Ambulatory Visit: Payer: PPO

## 2020-06-09 ENCOUNTER — Encounter: Payer: Self-pay | Admitting: Physical Therapy

## 2020-06-09 ENCOUNTER — Ambulatory Visit: Payer: PPO | Admitting: Physical Therapy

## 2020-06-09 DIAGNOSIS — R2689 Other abnormalities of gait and mobility: Secondary | ICD-10-CM | POA: Diagnosis not present

## 2020-06-09 DIAGNOSIS — R471 Dysarthria and anarthria: Secondary | ICD-10-CM

## 2020-06-09 DIAGNOSIS — R29818 Other symptoms and signs involving the nervous system: Secondary | ICD-10-CM

## 2020-06-09 DIAGNOSIS — R4701 Aphasia: Secondary | ICD-10-CM

## 2020-06-09 DIAGNOSIS — R2681 Unsteadiness on feet: Secondary | ICD-10-CM

## 2020-06-09 DIAGNOSIS — R41841 Cognitive communication deficit: Secondary | ICD-10-CM

## 2020-06-09 DIAGNOSIS — M6281 Muscle weakness (generalized): Secondary | ICD-10-CM

## 2020-06-09 NOTE — Therapy (Signed)
[facility] [address] [place], [place], [address] Phone: [phone]   Fax:  [phone]  Speech Language Pathology Evaluation  Patient Details  Name: Nicholas [name] MRN: [id number] Date of Birth: [birth year] Referring Provider (SLP): Alonza Bogus, DO   Encounter Date: 06/09/2020   End of Session - 06/09/20 1357    Visit Number 1    Number of Visits 17    Date for SLP Re-Evaluation 09/07/20    SLP Start Time 1149    SLP Stop Time  1228    SLP Time Calculation (min) 39 min           Past Medical History:  Diagnosis Date  . 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 Hyperlipidemia   . Hypertension   . Lymphedema     Past Surgical History:  Procedure Laterality Date  . 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 removal of melanoma of left foot/amputation of toes 1&25 Jul 2006  . WRIST FRACTURE SURGERY     74 years old-set    There were no vitals filed for this visit.   Subjective Assessment - 06/09/20 1317    Subjective "Everyone's going deaf."    Currently in Pain? No/denies              SLP Evaluation [facility] - 06/09/20 1317      SLP Visit Information   SLP Received On 06/09/20    Referring Provider (SLP) [name], REbecca, DO    Onset Date 12/18/17    Medical Diagnosis Parkinson's/ PSP      Subjective   Patient/Family Stated Goal Incr loudness of conversation      General Information   HPI hx of PD/Parkinson's Plus Syndrone; malignant melanoma, hyperlipidemia, HTN, insomnia, obesity, hyperglycemia, dizziness, OSA, urinary urgency, lymphedema. Known to ST from prior courses, most recently July - September 2019 when pt stopped tx due to lt shoulder sx.      Balance Screen   Has the patient fallen in the past 6 months --   PT eval today     Prior Functional Status   Cognitive/Linguistic Baseline Baseline deficits    Baseline deficit details problem  solving/sequencing, attention, memory    Type of Home House     Lives With Spouse    Vocation Retired      Associate Professor   Overall Cognitive Status History of cognitive impairments - at baseline    Area of Impairment Attention;Problem solving;Memory    Current Attention Level Selective    Attention Comments vs. processing delay    Problem Solving Difficulty sequencing;Slow processing      Auditory Comprehension   Overall Auditory Comprehension Appears within functional limits for tasks assessed      Verbal Expression   Overall Verbal Expression Impaired    Naming Impairment    Other Naming Comments anomia rarely noted in conversation today; will assess if clinically indicated    Other Verbal Expression Comments pt rare anomia did not affect pt's message today      Oral Motor/Sensory Function   Overall Oral Motor/Sensory Function Impaired    Labial ROM Reduced right;Reduced left   better with visual cues   Labial Symmetry Within Functional Limits    Labial Strength Reduced   better with cues   Lingual ROM Reduced right;Reduced left   better with visual cues   Lingual Symmetry Within Functional Limits  Lingual Strength Reduced   better with verbal cues   Facial ROM Reduced right;Reduced left      Motor Speech   Overall Motor Speech Impaired    Respiration Impaired    Level of Impairment Word    Phonation Low vocal intensity   /a/ (normal loudness) mid 60s dB   Resonance Within functional limits    Articulation Impaired    Level of Impairment Phrase    Intelligibility Intelligibility reduced    Word 75-100% accurate    Phrase 75-100% accurate    Sentence 75-100% accurate    Conversation 75-100% accurate   9% in quiet environment; decr to 80% with noise   Effective Techniques Increased vocal intensity   usual mod cues upper60s low70s dB-3 Qs and As;"SHOUT!"   Phonation --   loud /a/ average los 90s with rare min A   Volume Decibel Level;Soft   low to mid 60sdB in 5 minutes  simple conversation                          SLP Education - 06/09/20 1354    Education Details SHOUT! with everything, rationale for softer speech and need for loud "ah" x5 BID, loud /a/    Person(s) Educated Patient;Caregiver(s)    Methods Explanation;Demonstration;Verbal cues    Comprehension Verbalized understanding;Returned demonstration;Verbal cues required;Need further instruction            SLP Short Term Goals - 06/09/20 1401      SLP [place]itzer #1   Title pt will sustain loud /a/ at >90 dB at 30 cm over 3 consecutive sessions    Time 4    Period Weeks    Status New      SLP SHORT TERM GOAL #2   Title pt will exhibit volume of 70dB in sentence responses 90% over 3 sessions    Time 4    Period Weeks    Status New      SLP SHORT TERM GOAL #3   Title In 5 minutes simple conversation pt will achieve average speech volume of low 70s dB over two sessions    Time 4    Status New      SLP SHORT TERM GOAL #4   Title pt will undergo assessment of anomia if clinically indicated    Time 4    Period Weeks    Status New                  SLP Long Term Goals - 06/09/20 1402      SLP LONG TERM GOAL #1   Title pt will sustain loud /a/ at average >90dB at 30 cm over 5 consecutive sessions    Time 8    Period Weeks   or 17 total sessions - for all LTGs   Status New      SLP LONG TERM GOAL #2   Title pt will generate speech in 8 minutes simple to mod complex conversation with average >70dB over three sessions    Time 8    Period Weeks    Status New      SLP LONG TERM GOAL #3   Title pt to undergo assessment of cognitive linguistics PRN and goals added PRN    Time 6    Period Weeks    Status New            Plan - 06/09/20 1359    Clinical  Impression Statement Patient presents with hypokinetic dysarthria characterized by reduced vocal intensity (average low to mid 60s dB in 5 min conversation) and decreased respiratory support for speech  secondary to Parkinson's/PSP. Pt also reports wordfinding difficulties and these were noted rarely today. He reports he is not able to communicate with aid [name] or his wife as well as he could previously, due to his volume. Pt stimulable for increased vocal intensity in simple question and answer tasks, with SLP A. I recommend skilled ST to address dysarthria, cognitive-communication and dysphagia. SLP to monitor diet tolerance; will request MBS orders and schedule objective testing if warranted.    Speech Therapy Frequency 2x / week    Duration --   8 weeks or 17 total visits   Treatment/Interventions Aspiration precaution training;Diet toleration management by SLP;Trials of upgraded texture/liquids;Cognitive reorganization;Multimodal communcation approach;Compensatory strategies;Pharyngeal strengthening exercises;Language facilitation;Compensatory techniques;Cueing hierarchy;Internal/external aids;Functional tasks;SLP instruction and feedback;Patient/family education;Other (comment)   MBS   Potential to Achieve Goals Good    SLP Home Exercise Plan loud /a/    Consulted and Agree with Plan of Care Patient           Patient will benefit from skilled therapeutic intervention in order to improve the following deficits and impairments:   Dysarthria and anarthria  Aphasia  Cognitive communication deficit    Problem List Patient Active Problem List   Diagnosis Date Noted  . Cellulitis 04/12/2020  . Strain of right biceps 09/12/2017  . Lymphedema 02/10/2017  . BPH associated with nocturia 05/16/2016  . Senile purpura ([place]) 05/03/2016  . OSA (obstructive sleep apnea) 12/28/2015  . Hypersomnia 11/15/2015  . Cough 11/15/2015  . 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 Dizziness 12/30/2014  . Diastolic dysfunction 94/01/6807  . Former smoker 04/29/2014  . Chronic venous insufficiency 02/20/2014  . Hyperglycemia 08/02/2012  . Morbid obesity ([place]) 07/13/2010  . History of malignant  melanoma. left leg 03/09/2009  . Insomnia 10/03/2007  . Hyperlipidemia 12/13/2006  . Essential hypertension 12/13/2006    [facility] ,[place], [place]  06/09/2020, 2:06 PM  [facility] [address] [place], [place], [address] Phone: [phone]   Fax:  [phone]  Name: Nicholas [name] MRN: [id number] Date of Birth: 10/12/1945

## 2020-06-09 NOTE — Therapy (Signed)
[facility] [address] [place], [place], [address] Phone: [phone]   Fax:  [phone]  Physical Therapy Treatment  Patient Details  Name: Nicholas [name] MRN: [id number] Date of Birth: 01/25/1946 Referring Provider (PT): [name], DO   Encounter Date: 06/09/2020   PT End of Session - 06/09/20 1433    Visit Number 4    Number of Visits 9    Date for PT Re-Evaluation 07/20/20    Authorization Type HTA, VL:  [place] No auth    Progress Note Due on Visit 10    PT Start Time 1103    PT Stop Time 1145    PT Time Calculation (min) 42 min    Equipment Utilized During Treatment Gait belt    Activity Tolerance Patient tolerated treatment well    Behavior During Therapy WFL for tasks assessed/performed           Past Medical History:  Diagnosis Date  . Cancer [facility]) Jan 2008   skin; hx of melanoma left foot/ amputation of toes 1&2   . Hyperlipidemia   . Hypertension   . Lymphedema     Past Surgical History:  Procedure Laterality Date  . 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 removal of melanoma of left foot/amputation of toes 1&25 Jul 2006  . WRIST FRACTURE SURGERY     74 years old-set    There were no vitals filed for this visit.   Subjective Assessment - 06/09/20 1112    Subjective Wounds are all healed. No longer is being followed up with them. No falls. Gently crossed his legs for the first time the other day when laying down.    Patient is accompained by: --   caregiver, Gerald Stabs   Pertinent History PMH melanoma s/p amputation toes on L foot, HTN, hyperlipidemia, lymphedema    Limitations Walking;Standing;House hold activities    Patient Stated Goals Pt's goals for therapy are to get back in the pool for exercise    Currently in Pain? No/denies                       Outpatient Re[facility]/02/[facility]  Lymphedema Life Impact Scale Total  Score 14.71 %          Therapeutic Activity: Pt seated in rollator seat in lobby waiting room. Pt states he wants to be wheeled back in rollator. Discussed with pt that this would not be safe. Got clinic wheelchair. Needed pt's caregiver to stand behind rollator to prevent it from rolling backwards when pt would stand. Therapist stood with pt with pt having his LUE on therapist, pt unable to take any side steps to step to the manual w/c. Pt's caregiver had to bring manual w/c behind pt while PT stood with pt. Cues for slow descent into chair and then pt being able to be wheeled back to treatment room.   [facility] Adult PT Treatment/Exercise - 06/09/20 0001      Transfers   Transfers Sit to Stand;Stand to Sit    Sit to Stand 4: Min assist;With upper extremity assist;From chair/3-in-1    Sit to Stand Details Verbal cues for sequencing;Verbal cues for technique;Tactile cues for posture;Tactile cues for placement;Verbal cues for safe use of DME/AE    Sit to Stand Details (indicate cue type and reason) from manual w/c, x5 reps during session, cues initially to scoot out to edge, and for proper  UE placement, and using nose over toes/anterior lean for incr ease of coming to stand    Stand to Sit 4: Min assist;With upper extremity assist;To chair/3-in-1    Stand to Sit Details cues to back up fully to w/c before reaching and sitting back down      Ambulation/Gait   Ambulation/Gait Yes    Ambulation/Gait Assistance 4: Min assist    Ambulation/Gait Assistance Details with rollator only able to take a total of 1 step today - attempted gait with 4 different attempts, pt with significant freezing today and unable to perform any walking. utilized initially in standing weight shifting laterally 2 x 10 reps with therapist assisting with manual weight shift through hips, also tried A/P weight shifting to help with weight shifting before taking a big step, however pt unable to. also attempted auditory cues (counting  and using metronome) and cues to imagine stepping over an object. also attempted having pt marching in place, pt only able to perform 1 rep each leg. cues at times to stand tall and reset before attempting to take a large step     Assistive device Rollator      Exercises   Exercises Other Exercises    Other Exercises  seated alternating marching 2 x 10 reps with visual cue from therapist on how high to lift leg. then attempted performing standing marching at locked rollator for incr foot clearance- pt only able to perform 1 rep each leg                    PT Short Term Goals - 05/21/20 1816      PT SHORT TERM GOAL #1   Title Pt will be HEP with caregiver/family assist for improved strength, balance, transfers and gait.  TARGET 06/18/2020    Time 4    Period Weeks    Status New      PT SHORT TERM GOAL #2   Title Pt will perform 4 of 5 transfers sit<>stand with min guard assist and no posterior lean, for improved ease and safety of trasnfers.    Baseline posterior lean, min assist and extra time    Time 4    Period Weeks    Status New      PT SHORT TERM GOAL #3   Title Pt and caregiver will verbalize understanding of tips to reduce freezing and festination with short distance household gait, including doorways and turning.    Time 4    Period Weeks    Status New      PT SHORT TERM GOAL #4   Title TUG score to be assessed and goal to be written as approrpiate.    Time 4    Period Weeks    Status New             PT Long Term Goals - 05/21/20 1819      PT LONG TERM GOAL #1   Title Pt will perform updated HEP to address strength, balance, transfers, gait for improved mobility and decreased fall risk.  TARGET 07/16/2020    Time 8    Period Weeks      PT LONG TERM GOAL #2   Title Pt will perform 8 of 10 reps of sit<>stand transfers with supervision, no posterior lean or LOB, for improved ease and safety with transfers.    Time 8    Period Weeks    Status New        PT LONG TERM  GOAL #3   Title Pt will improve gait velocity to at least 1.6 ft/sec for decreased fall risk with household gait.    Time 8    Period Weeks    Status New      PT LONG TERM GOAL #4   Title Pt will verbalize plans for ongoing community fitness, including possible aquatic exercise as able to tolerate iwth lymphedema/wound hx, to maximize gains made in therapy.    Time 8    Period Weeks                 Plan - 06/09/20 1434    Clinical Impression Statement Pt with significant freezing of gait today - pt only able to take a total of 1 step with rollator. Attempted multiple techniques  - weight shifting with feet wide, marching in place, counting out loud to 3/using a metronome, and visual cues (imagine stepping over a big obstacle). However, pt unable to ambulate today. Therapist needing to provide assist at pt's hips for proper weight shifting. Will continue to progress towards LTGs.    Personal Factors and Comorbidities Comorbidity 3+    Comorbidities PMH melanoma s/p amputation toes on L foot, HTN, hyperlipidemia, lymphedema    Examination-Activity Limitations Locomotion Level;Transfers;Stand    Examination-Participation Restrictions Community Activity;Other   Community exercise (PD spin class)   Stability/Clinical Decision Making Evolving/Moderate complexity    Rehab Potential Good    PT Frequency 1x / week   may need to recert/increase frequency when/if pt becomes candidate for aquatic therapy   PT Duration 8 weeks   plus eval   PT Treatment/Interventions ADLs/Self Care Home Management;Aquatic Therapy;Gait training;Functional mobility training;Therapeutic activities;Therapeutic exercise;Balance training;Neuromuscular re-education;Patient/family education    PT Next Visit Plan gait training. transfer training with safe, optimal technique for transfers; tips to reduce freezing with gait; can pt perform standing march/kicks at locked rollator to work on foot clearance (pt  did not wear shoes due to lower extremity bandages, so I used shoe covers for his feet in clinic)    Consulted and Agree with Plan of Care Patient;Family member/caregiver    Family Member [employer], caregiver           Patient will benefit from skilled therapeutic intervention in order to improve the following deficits and impairments:  Abnormal gait, Difficulty walking, Decreased safety awareness, Decreased balance, Decreased mobility, Decreased strength, Postural dysfunction  Visit Diagnosis: Other abnormalities of gait and mobility  Unsteadiness on feet  Muscle weakness (generalized)  Other symptoms and signs involving the nervous system     Problem List Patient Active Problem List   Diagnosis Date Noted  . Cellulitis 04/12/2020  . Strain of right biceps 09/12/2017  . Lymphedema 02/10/2017  . BPH associated with nocturia 05/16/2016  . Senile purpura ([place]) 05/03/2016  . OSA (obstructive sleep apnea) 12/28/2015  . Hypersomnia 11/15/2015  . Cough 11/15/2015  . 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 Dizziness 12/30/2014  . Diastolic dysfunction 44/09/4740  . Former smoker 04/29/2014  . Chronic venous insufficiency 02/20/2014  . Hyperglycemia 08/02/2012  . Morbid obesity ([place]) 07/13/2010  . History of malignant melanoma. left leg 03/09/2009  . Insomnia 10/03/2007  . Hyperlipidemia 12/13/2006  . Essential hypertension 12/13/2006    Arliss Journey, PT, DPT  06/09/2020, 2:42 PM  [facility] [address] [place], [place], [address] Phone: [phone]   Fax:  [phone]  Name: Nicholas [name] MRN: [id number] Date of Birth: 28-May-1946

## 2020-06-09 NOTE — Patient Instructions (Signed)
   5 loud "ah" twice a day - use your alarms to help you remember  Bring 10 sentences you say every day to next session  SHOUT with everything you say

## 2020-06-11 ENCOUNTER — Ambulatory Visit: Payer: PPO | Admitting: Physical Therapy

## 2020-06-16 ENCOUNTER — Ambulatory Visit: Payer: PPO | Admitting: Physical Therapy

## 2020-06-21 ENCOUNTER — Telehealth: Payer: Self-pay | Admitting: Neurology

## 2020-06-21 DIAGNOSIS — G20 Parkinson's disease: Secondary | ICD-10-CM

## 2020-06-21 NOTE — Telephone Encounter (Signed)
Patient called and requested a letter to be sent to: [facility]. He said, "The letter needs to say I need a lift chair."  [place] [place], [place] [address]  [phone]

## 2020-06-21 NOTE — Telephone Encounter (Signed)
Tee, its printed and on my desk

## 2020-06-22 ENCOUNTER — Telehealth: Payer: Self-pay | Admitting: Neurology

## 2020-06-22 NOTE — Telephone Encounter (Signed)
Patient left message with accessnurse regarding prior closed phone note. He states he needs to talk to the office regarding rx to be faxed over, fax # [phone]

## 2020-06-22 NOTE — Telephone Encounter (Signed)
Contacted [employer]; no answer left a message advising them to contact office with fax number.   Spoke with patient and made him aware that once I hear back from [facility] then I will fax over his rx and he should hear from them soon. He voiced understanding.

## 2020-06-22 NOTE — Telephone Encounter (Signed)
Spoke with patient and informed him that the rx has been faxed. He voiced understanding.

## 2020-06-23 ENCOUNTER — Encounter: Payer: PPO | Admitting: Occupational Therapy

## 2020-06-23 ENCOUNTER — Ambulatory Visit: Payer: PPO | Admitting: Physical Therapy

## 2020-06-24 ENCOUNTER — Ambulatory Visit: Payer: PPO | Admitting: Physical Therapy

## 2020-06-24 ENCOUNTER — Ambulatory Visit: Payer: PPO | Admitting: Occupational Therapy

## 2020-06-25 ENCOUNTER — Ambulatory Visit: Payer: PPO | Admitting: Physical Therapy

## 2020-06-29 ENCOUNTER — Ambulatory Visit: Payer: PPO | Admitting: Occupational Therapy

## 2020-06-29 ENCOUNTER — Ambulatory Visit: Payer: PPO | Admitting: Physical Therapy

## 2020-06-29 ENCOUNTER — Ambulatory Visit: Payer: PPO

## 2020-07-02 ENCOUNTER — Ambulatory Visit: Payer: PPO | Admitting: Physical Therapy

## 2020-07-04 ENCOUNTER — Other Ambulatory Visit: Payer: Self-pay | Admitting: Neurology

## 2020-07-05 ENCOUNTER — Ambulatory Visit: Payer: PPO | Attending: Family Medicine

## 2020-07-05 ENCOUNTER — Encounter: Payer: Self-pay | Admitting: Occupational Therapy

## 2020-07-05 ENCOUNTER — Other Ambulatory Visit: Payer: Self-pay

## 2020-07-05 ENCOUNTER — Other Ambulatory Visit: Payer: Self-pay | Admitting: Neurology

## 2020-07-05 ENCOUNTER — Ambulatory Visit: Payer: PPO | Admitting: Occupational Therapy

## 2020-07-05 DIAGNOSIS — R2681 Unsteadiness on feet: Secondary | ICD-10-CM

## 2020-07-05 DIAGNOSIS — R278 Other lack of coordination: Secondary | ICD-10-CM | POA: Diagnosis not present

## 2020-07-05 DIAGNOSIS — R41841 Cognitive communication deficit: Secondary | ICD-10-CM | POA: Diagnosis not present

## 2020-07-05 DIAGNOSIS — R4701 Aphasia: Secondary | ICD-10-CM | POA: Diagnosis not present

## 2020-07-05 DIAGNOSIS — R2689 Other abnormalities of gait and mobility: Secondary | ICD-10-CM | POA: Insufficient documentation

## 2020-07-05 DIAGNOSIS — R471 Dysarthria and anarthria: Secondary | ICD-10-CM | POA: Diagnosis not present

## 2020-07-05 DIAGNOSIS — M6281 Muscle weakness (generalized): Secondary | ICD-10-CM | POA: Insufficient documentation

## 2020-07-05 DIAGNOSIS — R29898 Other symptoms and signs involving the musculoskeletal system: Secondary | ICD-10-CM | POA: Insufficient documentation

## 2020-07-05 DIAGNOSIS — R29818 Other symptoms and signs involving the nervous system: Secondary | ICD-10-CM

## 2020-07-05 DIAGNOSIS — R293 Abnormal posture: Secondary | ICD-10-CM

## 2020-07-05 NOTE — Therapy (Signed)
[facility] [address] [place], [place], [address] Phone: [phone]   Fax:  [phone]  Speech Language Pathology Treatment  Patient Details  Name: Nicholas [name] MRN: [id number] Date of Birth: 1945/09/05 Referring Provider (SLP): Alonza Bogus, DO   Encounter Date: 07/05/2020   End of Session - 07/05/20 1132    Visit Number 2    Number of Visits 17    Date for SLP Re-Evaluation 09/07/20    SLP Start Time 1023   6 minutes late   SLP Stop Time  1100    SLP Time Calculation (min) 37 min    Activity Tolerance Patient tolerated treatment well           Past Medical History:  Diagnosis Date  . 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 Hyperlipidemia   . Hypertension   . Lymphedema     Past Surgical History:  Procedure Laterality Date  . 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 removal of melanoma of left foot/amputation of toes 1&25 Jul 2006  . WRIST FRACTURE SURGERY     74 years old-set    There were no vitals filed for this visit.   Subjective Assessment - 07/05/20 1030    Subjective "My electric wheelchair comes today - tomorrow." Pt 6 minutes late.    Currently in Pain? No/denies                 ADULT SLP TREATMENT - 07/05/20 1032      General Information   Behavior/Cognition Alert;Cooperative;Pleasant mood      Treatment Provided   Treatment provided Cognitive-Linquistic      Cognitive-Linquistic Treatment   Treatment focused on Cognition;Dysarthria    Skilled Treatment SLP reviewed goals with pt today and he agreed to these. Pt did not bring in sentences - SLP guided pt to think of 4 sentences; pt is to think of 6 more by next session.In order to practice louder speech in automatic task, when SLP reviewed goals pt said "yes" or "no" in WNL loudness 75% of the time. Initially,usual mod cues were necessary - faded to min A rarely for WNL  volume.In sentnece responses (strucuted tasks) pt req'd mod cues occasionally for WNL loundess. SLP strongly reiterated to pt necessity of copmleting all homework and BID "ah"s and everyday sentences.      Assessment / Recommendations / Plan   Plan Continue with current plan of care      Progression Toward Goals   Progression toward goals Progressing toward goals            SLP Education - 07/05/20 1131    Education Details homework must be completed to have success in ST, rationale for everyday sentences    Person(s) Educated Patient;Caregiver(s)    Methods Explanation;Handout    Comprehension Verbalized understanding            SLP Short Term Goals - 07/05/20 1046      SLP SHORT TERM GOAL #1   Title pt will sustain loud /a/ at >90 dB at 30 cm over 3 consecutive sessions    Time 4    Period Weeks    Status On-going      SLP SHORT TERM GOAL #2   Title pt will exhibit volume of 70dB in sentence responses 90% over 3 sessions    Time 4    Period Weeks  Status On-going      SLP SHORT TERM GOAL #3   Title In 5 minutes simple conversation pt will achieve average speech volume of low 70s dB over two sessions    Time 4    Status On-going      SLP SHORT TERM GOAL #4   Title pt will undergo assessment of anomia if clinically indicated    Time 4    Period Weeks    Status On-going      SLP SHORT TERM GOAL #5   Title --            SLP Long Term Goals - 07/05/20 1048      SLP LONG TERM GOAL #1   Title pt will sustain loud /a/ at average >90dB at 30 cm over 5 consecutive sessions    Time 8    Period Weeks   or 17 total sessions - for all LTGs   Status On-going      SLP LONG TERM GOAL #2   Title pt will generate speech in 8 minutes simple to mod complex conversation with average >70dB over three sessions    Time 8    Period Weeks    Status On-going      SLP LONG TERM GOAL #3   Title pt to undergo assessment of cognitive linguistics PRN and goals added PRN     Time 6    Period Weeks    Status On-going             Patient will benefit from skilled therapeutic intervention in order to improve the following deficits and impairments:   Dysarthria and anarthria  Aphasia  Cognitive communication deficit    Problem List Patient Active Problem List   Diagnosis Date Noted  . Cellulitis 04/12/2020  . Strain of right biceps 09/12/2017  . Lymphedema 02/10/2017  . BPH associated with nocturia 05/16/2016  . Senile purpura ([place]) 05/03/2016  . OSA (obstructive sleep apnea) 12/28/2015  . Hypersomnia 11/15/2015  . Cough 11/15/2015  . 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 Dizziness 12/30/2014  . Diastolic dysfunction 55/73/2202  . Former smoker 04/29/2014  . Chronic venous insufficiency 02/20/2014  . Hyperglycemia 08/02/2012  . Morbid obesity ([place]) 07/13/2010  . History of malignant melanoma. left leg 03/09/2009  . Insomnia 10/03/2007  . Hyperlipidemia 12/13/2006  . Essential hypertension 12/13/2006    [facility] ,[place], [place]  07/05/2020, 11:33 AM  [facility] [address] [place], [place], [address] Phone: [phone]   Fax:  [phone]   Name: Nicholas [name] MRN: [id number] Date of Birth: Sep 19, 1945

## 2020-07-05 NOTE — Patient Instructions (Signed)
° °  Say LOUD "ah" 5 times, twice a day Say your everyday sentences (below) in a LOUD, STRONG voice, twice a day  1) [name] have you gotten the mail?  2) [name] what's for dinner?  3) [place] get down!  4) Have you heard from [place] today?  5)  6)  7)  8)  9)  10)

## 2020-07-05 NOTE — Therapy (Signed)
[facility] [address] [place], [place], [address] Phone: [phone]   Fax:  [phone]  Occupational Therapy Evaluation  Patient Details  Name: Nicholas [name] MRN: [id number] Date of Birth: 1946-06-30 Referring Provider (OT): Dr. [name]   Encounter Date: 07/05/2020   OT End of Session - 07/05/20 1531    Visit Number 1    Number of Visits 13    Date for OT Re-Evaluation 09/03/20    Authorization Type HTA 2021, copay per day, no auth needed    Authorization - Visit Number 1    Authorization - Number of Visits 10    Progress Note Due on Visit 10    OT Start Time 1105    OT Stop Time 1145    OT Time Calculation (min) 40 min    Activity Tolerance Patient tolerated treatment well    Behavior During Therapy [facility] for tasks assessed/performed           Past Medical History:  Diagnosis Date  . 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 Hyperlipidemia   . Hypertension   . Lymphedema     Past Surgical History:  Procedure Laterality Date  . 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 removal of melanoma of left foot/amputation of toes 1&25 Jul 2006  . WRIST FRACTURE SURGERY     74 years old-set    There were no vitals filed for this visit.   Subjective Assessment - 07/05/20 1108    Subjective  Pt reports that he has now moved to independent living and has aide    Patient is accompanied by: --   aide   Pertinent History Parkinson's Plus Syndrome.   PMH: hx of malignant melanoma, hyperlipidemia, HTN, insomnia, obesity, hyperglycemia, dizziness, OSA, urinary urgency, lymphedema,chronic venous insufficiency, distolic dysfunction, hypersommnia, hx of L shoulder dislocation 2019, hx/hospitalization for cellulitis 03/2020    Patient Stated Goals be able to write (a check)    Currently in Pain? No/denies             [facility] OT Assessment - 07/05/20 1109      Assessment    Medical Diagnosis Parkinson's Plus Syndrome    Referring Provider (OT) Dr. [name]    Onset Date/Surgical Date 05/25/20   OT referral   Hand Dominance Right    Prior Therapy previous therapy at this center; last OT 03/2018      Precautions   Precautions Fall    Precaution Comments Wearing bandages for lymphedema/cellulitis (being followed by wound center), uses compression machine (BLE)      Balance Screen   Has the patient fallen in the past 6 months Yes    How many times? 6-7   ambulation with rollator     Home  Environment   Family/patient expects to be discharged to: Private residence    Living Arrangements Spouse/significant other    Type of [place] With Significant other   aide 3 days/week     Prior Function   Level of Independence Needs assistance with ADLs;Needs assistance with gait;Needs assistance with transfers    Comments enjoys being out in the community, still consulting Electrical engineer), watching sports      ADL   Eating/Feeding --   frequent drops, eats with spoon or stabs, difficulty turning utensil, assist for cutting meat at times  Grooming --   mod I, touch ups by aide, difficulty turning razor/toothbrush   Upper Body Bathing Modified independent    Lower Body Bathing Modified independent    Upper Body Dressing --   mod I   Lower Body Dressing Minimal assistance   dependent for compression stockings   Toilet Transfer Modified independent    Toileting - Clothing Manipulation Modified independent    Toileting -  Hygiene Modified Independent    Tub/Shower Transfer Moderate assistance    Tub/Shower Transfer Equipment Walk in shower;Grab bars   with seat   Transfers/Ambulation Related to ADL's sleeps in lift chair      IADL   Meal Prep --   can retrieve a drink, but typically gets help because it takes so long   Prior Level of Function Community Mobility independent    Community Mobility Relies on family or friends for  transportation   stopped driving ?last year   Medication Management Is responsible for taking medication in correct dosages at correct time    Physiological scientist financial matters independently (budgets, writes checks, pays rent, bills goes to bank), collects and keeps track of income      Mobility   Mobility Status Comments uses rollator to walk to restroom, w/c in the home with assist to propel, power w/c coming tomorrow.   difficulty/incr time/supervision for sit>stand     Written Expression   Dominant Hand Right    Handwriting --   mild-mod micrographia, 99% legibility     Vision - History   Baseline Vision Wears glasses all the time      Vision Assessment   Tracking/Visual Pursuits --   WFL   Convergence Within functional limits    Diplopia Assessment --   denies     Activity Tolerance   Activity Tolerance Comments pt appeared to tire quickly with exertion (donning/doffing jacket or standing)      Cognition   Overall Cognitive Status Cognition to be further assessed in functional context PRN   also see ST notes   Area of Impairment Memory    Memory Comments pt reports difficulty ("my wife says I have trouble")      Observation/Other Assessments   Standing Functional Reach Test NT, recommend close supervision for standing    Other Surveys  Select    Physical Performance Test   Yes    Simulated Eating Time (seconds) 13.91    Simulated Eating Comments holds spoon at the end    Donning Doffing Jacket Time (seconds) 40.07sec    Donning Doffing Jacket Comments decr balance (CGA and significant effort for sit>stand and stand>sit)      Posture/Postural Control   Posture/Postural Control Postural limitations    Postural Limitations Forward head;Rounded Shoulders      Coordination   9 Hole Peg Test Right;Left    Right 9 Hole Peg Test 32.28    Left 9 Hole Peg Test 43.44    Box and Blocks R-39blocks, L-37blocks      Tone   Assessment Location Right Upper  Extremity;Left Upper Extremity      AROM   Overall AROM  Deficits    Overall AROM Comments BUEs grossly WFL, but mild decr L shoulder ROM noted (pt reports hx of L shoulder dislocation 2019)      RUE Tone   RUE Tone Moderate   elbow, mild shoulder     LUE Tone   LUE Tone Moderate   elbow, mild shoulder  OT Short Term Goals - 07/05/20 1542      OT SHORT TERM GOAL #1   Title Pt will be independent with updated HEP.--check STGs 08/05/20    Time 4    Period Weeks    Status New      OT SHORT TERM GOAL #2   Title Pt will be able to write short paragraph and sign name with 100% legibility and only minimal decr in size use AE/strategies.    Time 4    Period Weeks    Status New      OT SHORT TERM GOAL #3   Title Pt will verbalize understanding of ways to decr risk of future complications related to PD.    Baseline --    Time 4    Period Weeks    Status New      OT SHORT TERM GOAL #4   Title --             OT Long Term Goals - 07/05/20 1543      OT LONG TERM GOAL #1   Title Pt will verbalize understanding of updated AE/strategies to incr safety, ease, and independence with ADLs/IADLs (including eating and grooming).-09/05/20    Time 8    Period Weeks    Status New      OT LONG TERM GOAL #2   Title Pt will improve coordination for ADLs as shown by improving time on 9-hole peg test by at least 5sec with L hand.    Baseline L-43.44sec    Time 8    Period Weeks    Status New      OT LONG TERM GOAL #3   Title Pt will improve ability to dress/balance as shown by improving time on PPT#4 by at least 5sec.    Baseline 40.07sec once standing    Time 8    Period Weeks    Status New      OT LONG TERM GOAL #4   Title --      OT LONG TERM GOAL #5   Title --                 Plan - 07/05/20 1533    Clinical Impression Statement Pt is a 74 y.o. 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  Pt is  familiar to this therapist from previous occupational therapy at this site.  Last discharged by OT 2019.  Pt presents with decline with ADLs/IADLs, he no longer participates in community exercise classes, has stopped driving, moved to independent living, and now has aide assist 3x/week.  Pt also reports multiple falls in last 6 months.  Pt with PMH that includes:hx of malignant melanoma, hyperlipidemia, HTN, insomnia, obesity, hyperglycemia, dizziness, OSA, urinary urgency, lymphedema, chronic venous insufficiency, distolic dysfunction, hypersommnia, hx of L shoulder dislocation 2019, hx/hospitalization for cellulitis 03/2020.  Pt presents today with hypokinesia/bradykinesia, rigidity, decr posture, decr coordination, decr balance/functional mobility, cognitive deficits affecting ADLs/IADL performance.  Pt would benefit from occupational therapy to address these deficits, update HEP and incr ease/safety with ADLs/IADLs.    OT Occupational Profile and History Detailed Assessment- Review of Records and additional review of physical, cognitive, psychosocial history related to current functional performance    Occupational performance deficits (Please refer to evaluation for details): ADL's;IADL's;Leisure;Work;Social Participation    Body Structure / Function / Physical Skills ADL;Decreased knowledge of use of DME;Tone;Dexterity;Balance;Edema;IADL;Endurance;Improper spinal/pelvic alignment;Coordination;Mobility;FMC;Decreased knowledge of precautions    Cognitive Skills Memory    Rehab Potential  Good    Clinical Decision Making Several treatment options, min-mod task modification necessary    Comorbidities Affecting Occupational Performance: Presence of comorbidities impacting occupational performance    Comorbidities impacting occupational performance description: lymphedema, obesity    Modification or Assistance to Complete Evaluation  Min-Moderate modification of tasks or assist with assess necessary to  complete eval    OT Frequency 2x / week    OT Duration 4 weeks   followed by 1x wk for 4 weeks +eval (may be modified due to scheduling concerns)   OT Treatment/Interventions Self-care/ADL training;Moist Heat;DME and/or AE instruction;Therapeutic activities;Aquatic Therapy;Cognitive remediation/compensation;Therapeutic exercise;Cryotherapy;Neuromuscular education;Functional Mobility Training;Passive range of motion;Patient/family education;Manual Therapy;Energy conservation    Plan update coordination HEP, writing strategies    Consulted and Agree with Plan of Care Patient           Patient will benefit from skilled therapeutic intervention in order to improve the following deficits and impairments:   Body Structure / Function / Physical Skills: ADL,Decreased knowledge of use of DME,Tone,Dexterity,Balance,Edema,IADL,Endurance,Improper spinal/pelvic alignment,Coordination,Mobility,FMC,Decreased knowledge of precautions Cognitive Skills: Memory     Visit Diagnosis: Other symptoms and signs involving the nervous system  Other symptoms and signs involving the musculoskeletal system  Abnormal posture  Other lack of coordination  Unsteadiness on feet  Other abnormalities of gait and mobility    Problem List Patient Active Problem List   Diagnosis Date Noted  . Cellulitis 04/12/2020  . Strain of right biceps 09/12/2017  . Lymphedema 02/10/2017  . BPH associated with nocturia 05/16/2016  . Senile purpura ([place]) 05/03/2016  . OSA (obstructive sleep apnea) 12/28/2015  . Hypersomnia 11/15/2015  . Cough 11/15/2015  . Parkinson's plus syndrome ([place]) 06/22/2015  . Dizziness 12/30/2014  . Diastolic dysfunction 09/32/6712  . Former smoker 04/29/2014  . Chronic venous insufficiency 02/20/2014  . Hyperglycemia 08/02/2012  . Morbid obesity ([place]) 07/13/2010  . History of malignant melanoma. left leg 03/09/2009  . Insomnia 10/03/2007  . Hyperlipidemia 12/13/2006  . Essential  hypertension 12/13/2006    [facility] 07/05/2020, 3:48 PM  [facility] [address] [place], [place], [address] Phone: [phone]   Fax:  [phone]  Name: TIANT PEIXOTO MRN: [id number] Date of Birth: [birth year]   Vianne Bulls, OTR/L [facility] [address] [place], [place]  [address] [phone] phone [phone] 07/05/20 3:48 PM

## 2020-07-06 DIAGNOSIS — G232 Striatonigral degeneration: Secondary | ICD-10-CM | POA: Diagnosis not present

## 2020-07-06 DIAGNOSIS — R42 Dizziness and giddiness: Secondary | ICD-10-CM | POA: Diagnosis not present

## 2020-07-06 DIAGNOSIS — I890 Lymphedema, not elsewhere classified: Secondary | ICD-10-CM | POA: Diagnosis not present

## 2020-07-06 DIAGNOSIS — S46211A Strain of muscle, fascia and tendon of other parts of biceps, right arm, initial encounter: Secondary | ICD-10-CM | POA: Diagnosis not present

## 2020-07-07 ENCOUNTER — Ambulatory Visit: Payer: PPO | Admitting: Occupational Therapy

## 2020-07-09 ENCOUNTER — Ambulatory Visit: Payer: PPO | Admitting: Physical Therapy

## 2020-07-09 ENCOUNTER — Ambulatory Visit: Payer: PPO

## 2020-07-09 ENCOUNTER — Encounter: Payer: PPO | Admitting: Occupational Therapy

## 2020-07-09 NOTE — Progress Notes (Signed)
Assessment/Plan:   1.  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  History of melanoma  -Follows with dermatology.  Understands that parkinsonism does increase risk for melanoma as well.  3.  Lymphedema  -Follows with wound care. Subjective:   Nicholas [name] was seen today in follow up for parkinsonism.  My previous records were reviewed prior to todays visit as well as outside records available to me.  Last visit, we were working on getting the patient a motorized wheelchair.  Today, he reports that he just got it on Tuesday.  Lift chair prescription has also been provided since last visit.  Medical records reviewed.  Patient has been to wound care as well as physical and speech therapy since last visit.  Moved to [place] 10/8.  He did fall over an area rug since that move - it bunched up on him and he fell.  Had a few prior to moving as well.  Does have a caregiver m-w-Friday, 3 hours per day  Current prescribed movement disorder medications: Carbidopa/levodopa 25/100, 2 tablets 4 times per day    ALLERGIES:   Allergies  Allergen Reactions  . Amoxicillin Swelling    Presumed angioedema of lips due to prednisone    CURRENT MEDICATIONS:  Outpatient Encounter Medications as of 07/13/2020  Medication Sig  . carbidopa-levodopa (SINEMET IR) 25-100 MG tablet TAKE 2 TABLETS AT 7AM,11AM, 3PM, AND AT 7PM.  . rosuvastatin (CRESTOR) 10 MG tablet TAKE 1 TABLET ONCE DAILY.   No facility-administered encounter medications on file as of 07/13/2020.    Objective:   PHYSICAL EXAMINATION:    VITALS:   Vitals:   07/13/20 1441  BP: 140/84  Pulse: (!) 101  SpO2: 98%  Weight: 277 lb (125.6 kg)  Height: 5\' 6"  (1.676 m)    GEN:  The patient appears stated age and is in NAD. HEENT:  Normocephalic, atraumatic.  The mucous membranes are moist.  The superficial temporal arteries are without ropiness or tenderness. CV:  Tachy.  regular Lungs:  CTAB Neck/HEME:  There are no carotid bruits bilaterally.  Neurological examination:  Orientation: The patient is alert and oriented x3. Cranial nerves: There is good facial symmetry with facial hypomimia. EOMI with the exception of upgaze paresis.  There is square wave jerks.  The speech is fluent and clear. Soft palate rises symmetrically and there is no tongue deviation. Hearing is intact to conversational tone. Sensation: Sensation is intact to light touch throughout Motor: Strength is at least antigravity x4.  Movement examination: Tone: There is mild increased tone in the rue Abnormal movements: none Coordination:  There is mild decremation with RAM's, with hand opening and closing and finger taps bilaterally Gait and Station: Not tested today due to instability/wheelchair-bound  I have reviewed and interpreted the following labs independently    Chemistry      Component Value Date/Time   NA 139 04/13/2020 0336   K 4.0 04/13/2020 0336   CL 106 04/13/2020 0336   CO2 25 04/13/2020 0336   BUN 23 04/13/2020 0336   CREATININE 0.72 04/13/2020 0336   CREATININE 1.01 10/03/2019 1517      Component Value Date/Time   CALCIUM 8.5 (L) 04/13/2020 0336   ALKPHOS 63 04/12/2020 1331   AST 13 (L) 04/12/2020 1331   ALT <5 04/12/2020 1331  BILITOT 0.5 04/12/2020 1331       Lab Results  Component Value Date   WBC 7.6 04/13/2020   HGB 12.9 (L) 04/13/2020   HCT 40.8 04/13/2020   MCV 96.9 04/13/2020   PLT 163 04/13/2020    Lab Results  Component Value Date   TSH 1.42 06/19/2018     Total time spent on today's visit was 25 minutes, including both face-to-face time and nonface-to-face time.  Time included that spent on review of records (prior notes available to me/labs/imaging if pertinent), discussing treatment and goals, answering patient's questions and coordinating  care.  Cc:  [name], MD

## 2020-07-12 ENCOUNTER — Ambulatory Visit: Payer: PPO | Admitting: Occupational Therapy

## 2020-07-12 ENCOUNTER — Other Ambulatory Visit: Payer: Self-pay

## 2020-07-12 DIAGNOSIS — R2689 Other abnormalities of gait and mobility: Secondary | ICD-10-CM

## 2020-07-12 DIAGNOSIS — R293 Abnormal posture: Secondary | ICD-10-CM

## 2020-07-12 DIAGNOSIS — R278 Other lack of coordination: Secondary | ICD-10-CM

## 2020-07-12 DIAGNOSIS — R2681 Unsteadiness on feet: Secondary | ICD-10-CM

## 2020-07-12 DIAGNOSIS — R471 Dysarthria and anarthria: Secondary | ICD-10-CM | POA: Diagnosis not present

## 2020-07-12 DIAGNOSIS — R29818 Other symptoms and signs involving the nervous system: Secondary | ICD-10-CM

## 2020-07-12 DIAGNOSIS — R29898 Other symptoms and signs involving the musculoskeletal system: Secondary | ICD-10-CM

## 2020-07-12 NOTE — Patient Instructions (Signed)
Coordination Exercises  Perform the following exercises for 20 minutes 1 times per day. Perform with both hand(s). Perform using big movements.   Flipping Cards: Place deck of cards on the table. Flip cards over by opening your hand big to grasp and then turn your palm up big.  Deal cards: Hold 1/2 or whole deck in your hand. Use thumb to push card off top of deck with one big push.  Rotate ball with fingertips: Pick up with fingers/thumb and move as much as you can with each turn/movement (clockwise and counter-clockwise).  Pick up 5-10 coins one at a time and hold in palm. Then, move coins from palm to fingertips one at time and place in coin bank/container.  Pick up 5-10 coins one at a time and hold in palm. Then, move coins from palm to fingertips one at a time to stack.  Practice writing: Slow down, write big, and focus on forming each letter.  PWR! Hands:  Start with elbows bent and hands closed  Push hands out BIG. Elbows straight, wrists up, fingers open and spread apart BIG.  PWR! Step: Touch index finger to thumb while keeping other fingers straight. Flick fingers out BIG (thumb out/straighten fingers). Repeat with other fingers. (Step your thumb to each finger). With arms stretched out in front of you (elbows straight), PWR! Rock:  Move wrists up and down BIG With arms stretched out in front of you (elbows straight)PWR! Twist: Twist palms up and down BIG

## 2020-07-12 NOTE — Therapy (Signed)
[facility] [address] [place], [place], [address] Phone: [phone]   Fax:  [phone]  Occupational Therapy Treatment  Patient Details  Name: Nicholas [name] MRN: [id number] Date of Birth: 1946/04/23 Referring Provider (OT): Dr. [name]   Encounter Date: 07/12/2020   OT End of Session - 07/12/20 1110    Visit Number 2    Number of Visits 13    Date for OT Re-Evaluation 09/03/20    Authorization Type HTA 2021, copay per day, no auth needed    Authorization - Visit Number 2    Authorization - Number of Visits 10    Progress Note Due on Visit 10    OT Start Time 1105    OT Stop Time 1145    OT Time Calculation (min) 40 min    Activity Tolerance Patient tolerated treatment well    Behavior During Therapy [facility] for tasks assessed/performed           Past Medical History:  Diagnosis Date  . 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 Hyperlipidemia   . Hypertension   . Lymphedema     Past Surgical History:  Procedure Laterality Date  . 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 removal of melanoma of left foot/amputation of toes 1&25 Jul 2006  . WRIST FRACTURE SURGERY     74 years old-set    There were no vitals filed for this visit.   Subjective Assessment - 07/12/20 1108    Subjective  Got power wheelchair.  Hit a chair and broke it.    Patient is accompanied by: --   aide   Pertinent History Parkinson's Plus Syndrome.   PMH: hx of malignant melanoma, hyperlipidemia, HTN, insomnia, obesity, hyperglycemia, dizziness, OSA, urinary urgency, lymphedema,chronic venous insufficiency, distolic dysfunction, hypersommnia, hx of L shoulder dislocation 2019, hx/hospitalization for cellulitis 03/2020    Patient Stated Goals be able to write (a check)    Currently in Pain? No/denies                  OT Education - 07/12/20 1157    Education Details PWR! hands  (basic 4); Coordination HEP--see pt instructions    Person(s) Educated Patient    Methods Explanation;Demonstration;Verbal cues;Handout;Tactile cues    Comprehension Verbalized understanding;Returned demonstration;Verbal cues required            OT Short Term Goals - 07/05/20 1542      OT SHORT TERM GOAL #1   Title Pt will be independent with updated HEP.--check STGs 08/05/20    Time 4    Period Weeks    Status New      OT SHORT TERM GOAL #2   Title Pt will be able to write short paragraph and sign name with 100% legibility and only minimal decr in size use AE/strategies.    Time 4    Period Weeks    Status New      OT SHORT TERM GOAL #3   Title Pt will verbalize understanding of ways to decr risk of future complications related to PD.    Baseline --    Time 4    Period Weeks    Status New      OT SHORT TERM GOAL #4   Title --             OT Long Term Goals - 07/05/20 1543  OT LONG TERM GOAL #1   Title Pt will verbalize understanding of updated AE/strategies to incr safety, ease, and independence with ADLs/IADLs (including eating and grooming).-09/05/20    Time 8    Period Weeks    Status New      OT LONG TERM GOAL #2   Title Pt will improve coordination for ADLs as shown by improving time on 9-hole peg test by at least 5sec with L hand.    Baseline L-43.44sec    Time 8    Period Weeks    Status New      OT LONG TERM GOAL #3   Title Pt will improve ability to dress/balance as shown by improving time on PPT#4 by at least 5sec.    Baseline 40.07sec once standing    Time 8    Period Weeks    Status New      OT LONG TERM GOAL #4   Title --      OT LONG TERM GOAL #5   Title --                 Plan - 07/12/20 1110    Clinical Impression Statement Pt returned demo updated coordination HEP, but needs cueing for incr movement amplitude.  Pt resumes small movement patterns with repetition or frustration.    OT Occupational Profile and History  Detailed Assessment- Review of Records and additional review of physical, cognitive, psychosocial history related to current functional performance    Occupational performance deficits (Please refer to evaluation for details): ADL's;IADL's;Leisure;Work;Social Participation    Body Structure / Function / Physical Skills ADL;Decreased knowledge of use of DME;Tone;Dexterity;Balance;Edema;IADL;Endurance;Improper spinal/pelvic alignment;Coordination;Mobility;FMC;Decreased knowledge of precautions    Cognitive Skills Memory    Rehab Potential Good    Clinical Decision Making Several treatment options, min-mod task modification necessary    Comorbidities Affecting Occupational Performance: Presence of comorbidities impacting occupational performance    Comorbidities impacting occupational performance description: lymphedema, obesity    Modification or Assistance to Complete Evaluation  Min-Moderate modification of tasks or assist with assess necessary to complete eval    OT Frequency 2x / week    OT Duration 4 weeks   followed by 1x wk for 4 weeks +eval (may be modified due to scheduling concerns)   OT Treatment/Interventions Self-care/ADL training;Moist Heat;DME and/or AE instruction;Therapeutic activities;Aquatic Therapy;Cognitive remediation/compensation;Therapeutic exercise;Cryotherapy;Neuromuscular education;Functional Mobility Training;Passive range of motion;Patient/family education;Manual Therapy;Energy conservation    Plan review coordination HEP, writing strategies    Consulted and Agree with Plan of Care Patient           Patient will benefit from skilled therapeutic intervention in order to improve the following deficits and impairments:   Body Structure / Function / Physical Skills: ADL,Decreased knowledge of use of DME,Tone,Dexterity,Balance,Edema,IADL,Endurance,Improper spinal/pelvic alignment,Coordination,Mobility,FMC,Decreased knowledge of precautions Cognitive Skills: Memory      Visit Diagnosis: Other symptoms and signs involving the nervous system  Other symptoms and signs involving the musculoskeletal system  Abnormal posture  Other lack of coordination  Unsteadiness on feet  Other abnormalities of gait and mobility    Problem List Patient Active Problem List   Diagnosis Date Noted  . Cellulitis 04/12/2020  . Strain of right biceps 09/12/2017  . Lymphedema 02/10/2017  . BPH associated with nocturia 05/16/2016  . Senile purpura ([place]) 05/03/2016  . OSA (obstructive sleep apnea) 12/28/2015  . Hypersomnia 11/15/2015  . Cough 11/15/2015  . Parkinson's plus syndrome ([place]) 06/22/2015  . Dizziness 12/30/2014  . Diastolic dysfunction 25/36/6440  .  Former smoker 04/29/2014  . Chronic venous insufficiency 02/20/2014  . Hyperglycemia 08/02/2012  . Morbid obesity ([place]) 07/13/2010  . History of malignant melanoma. left leg 03/09/2009  . Insomnia 10/03/2007  . Hyperlipidemia 12/13/2006  . Essential hypertension 12/13/2006    [name] 07/12/2020, 12:28 PM  [facility] [address] [place], [place], [address] Phone: [phone]   Fax:  [phone]  Name: NATE COMMON MRN: [id number] Date of Birth: [birth year]    Vianne Bulls, OTR/L [facility] [address] [place], [place]  [phone] phone [phone] 07/12/20 12:28 PM

## 2020-07-13 ENCOUNTER — Encounter: Payer: Self-pay | Admitting: Neurology

## 2020-07-13 ENCOUNTER — Ambulatory Visit (INDEPENDENT_AMBULATORY_CARE_PROVIDER_SITE_OTHER): Payer: PPO | Admitting: Neurology

## 2020-07-13 ENCOUNTER — Other Ambulatory Visit: Payer: Self-pay

## 2020-07-13 VITALS — BP 140/84 | HR 101 | Ht 66.0 in | Wt 277.0 lb

## 2020-07-13 DIAGNOSIS — G20 Parkinson's disease: Secondary | ICD-10-CM

## 2020-07-13 MED ORDER — CARBIDOPA-LEVODOPA 25-100 MG PO TABS
ORAL_TABLET | ORAL | 1 refills | Status: DC
Start: 2020-07-13 — End: 2021-01-11

## 2020-07-13 NOTE — Patient Instructions (Signed)
The physicians and staff at  [facility] are committed to providing excellent care. You may receive a survey requesting feedback about your experience at our office. We strive to receive "very good" responses to the survey questions. If you feel that your experience would prevent you from giving the office a "very good " response, please contact our office to try to remedy the situation. We may be reached at [phone]. Thank you for taking the time out of your busy day to complete the survey.  

## 2020-07-15 ENCOUNTER — Other Ambulatory Visit: Payer: Self-pay

## 2020-07-15 ENCOUNTER — Encounter: Payer: Self-pay | Admitting: Occupational Therapy

## 2020-07-15 ENCOUNTER — Ambulatory Visit: Payer: PPO

## 2020-07-15 ENCOUNTER — Ambulatory Visit: Payer: PPO | Admitting: Occupational Therapy

## 2020-07-15 DIAGNOSIS — R278 Other lack of coordination: Secondary | ICD-10-CM

## 2020-07-15 DIAGNOSIS — R4701 Aphasia: Secondary | ICD-10-CM

## 2020-07-15 DIAGNOSIS — R293 Abnormal posture: Secondary | ICD-10-CM

## 2020-07-15 DIAGNOSIS — R29898 Other symptoms and signs involving the musculoskeletal system: Secondary | ICD-10-CM

## 2020-07-15 DIAGNOSIS — R471 Dysarthria and anarthria: Secondary | ICD-10-CM | POA: Diagnosis not present

## 2020-07-15 DIAGNOSIS — R2681 Unsteadiness on feet: Secondary | ICD-10-CM

## 2020-07-15 DIAGNOSIS — R29818 Other symptoms and signs involving the nervous system: Secondary | ICD-10-CM

## 2020-07-15 DIAGNOSIS — R41841 Cognitive communication deficit: Secondary | ICD-10-CM

## 2020-07-15 NOTE — Patient Instructions (Signed)
Continue loud "ah" and your sentences every day, twice daily.  Continue to work through [employer] I gave you last time with a LOUD voice!

## 2020-07-15 NOTE — Therapy (Signed)
[facility] [address] [place], [place], [address] Phone: [phone]   Fax:  [phone]  Speech Language Pathology Treatment  Patient Details  Name: Nicholas [name] MRN: [id number] Date of Birth: 1945-11-25 Referring Provider (SLP): Alonza Bogus, DO   Encounter Date: 07/15/2020   End of Session - 07/15/20 1136    Visit Number 3    Number of Visits 17    Date for SLP Re-Evaluation 09/07/20    SLP Start Time 1027   pt 10 minutes late   SLP Stop Time  1100    SLP Time Calculation (min) 33 min    Activity Tolerance Patient tolerated treatment well           Past Medical History:  Diagnosis Date  . Cancer [facility]) Jan 2008   skin; hx of melanoma left foot/ amputation of toes 1&2   . Hyperlipidemia   . Hypertension   . Lymphedema     Past Surgical History:  Procedure Laterality Date  . 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 removal of melanoma of left foot/amputation of toes 1&25 Jul 2006  . WRIST FRACTURE SURGERY     74 years old-set    There were no vitals filed for this visit.   Subjective Assessment - 07/15/20 1033    Subjective Stepson here today - he states pt is louder, generally, than last time he was here (greater than a month ago). Pt 10 minutes late.    Patient is accompained by: Family member   stepson [name]   Currently in Pain? No/denies                 ADULT SLP TREATMENT - 07/15/20 1051      General Information   Behavior/Cognition Alert;Cooperative;Pleasant mood      Treatment Provided   Treatment provided Cognitive-Linquistic      Cognitive-Linquistic Treatment   Treatment focused on Cognition;Dysarthria    Skilled Treatment Pt loud /a/ average lower 90s dB. Pt recited 10 sentences with initial cues for loudness at average lower 80s dB. Pt with explanation of how city has changed with WNL volume - 2-3 minute monologue. 1-2 sentence (mod complex) responses - pt req'd min-mod  cues for loudness, as his fatigue increased.      Assessment / Recommendations / Plan   Plan Continue with current plan of care      Progression Toward Goals   Progression toward goals Progressing toward goals              SLP Short Term Goals - 07/15/20 1139      SLP SHORT TERM GOAL #1   Title pt will sustain loud /a/ at >90 dB at 30 cm over 3 consecutive sessions    Time 3    Period Weeks    Status On-going      SLP SHORT TERM GOAL #2   Title pt will exhibit volume of 70dB in sentence responses 90% over 3 sessions    Time 3    Period Weeks    Status On-going      SLP SHORT TERM GOAL #3   Title In 5 minutes simple conversation pt will achieve average speech volume of low 70s dB over two sessions    Time 3    Status On-going      SLP SHORT TERM GOAL #4   Title pt will undergo assessment of anomia if clinically indicated    Time 3  Period Weeks    Status On-going            SLP Long Term Goals - 07/15/20 1139      SLP LONG TERM GOAL #1   Title pt will sustain loud /a/ at average >90dB at 30 cm over 5 consecutive sessions    Time 7    Period Weeks   or 17 total sessions - for all LTGs   Status On-going      SLP LONG TERM GOAL #2   Title pt will generate speech in 8 minutes simple to mod complex conversation with average >70dB over three sessions    Time 7    Period Weeks    Status On-going      SLP LONG TERM GOAL #3   Title pt to undergo assessment of cognitive linguistics PRN and goals added PRN    Time 5    Period Weeks    Status On-going            Plan - 07/15/20 1137    Clinical Impression Statement Romona Curls "[place]" 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 Pt also reports wordfinding difficulties and these were noted rarely today. He reports he is not able to communicate with aid [name] or his wife as well as he could previously, due  to his volume. Pt arrived talking in louder speech than previously seen however fatigue negatively impacted pt and by 15 minutes into session pt volume had decr'd to mid 60s dB. See "skilled intervention" for more details. I recommend skilled ST to address dysarthria, cognitive-communication and dysphagia. SLP to monitor diet tolerance; will request MBS orders and schedule objective testing if warranted.    Speech Therapy Frequency 2x / week    Duration --   8 weeks or 17 total visits   Treatment/Interventions Aspiration precaution training;Diet toleration management by SLP;Trials of upgraded texture/liquids;Cognitive reorganization;Multimodal communcation approach;Compensatory strategies;Pharyngeal strengthening exercises;Language facilitation;Compensatory techniques;Cueing hierarchy;Internal/external aids;Functional tasks;SLP instruction and feedback;Patient/family education;Other (comment)   MBS   Potential to Achieve Goals Good    SLP Home Exercise Plan loud /a/    Consulted and Agree with Plan of Care Patient           Patient will benefit from skilled therapeutic intervention in order to improve the following deficits and impairments:   Dysarthria and anarthria  Cognitive communication deficit  Aphasia    Problem List Patient Active Problem List   Diagnosis Date Noted  . Cellulitis 04/12/2020  . Strain of right biceps 09/12/2017  . Lymphedema 02/10/2017  . BPH associated with nocturia 05/16/2016  . Senile purpura ([place]) 05/03/2016  . OSA (obstructive sleep apnea) 12/28/2015  . Hypersomnia 11/15/2015  . Cough 11/15/2015  . 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 Dizziness 12/30/2014  . Diastolic dysfunction [id number]  . Former smoker 04/29/2014  . Chronic venous insufficiency 02/20/2014  . Hyperglycemia 08/02/2012  . Morbid obesity ([place]) 07/13/2010  . History of malignant melanoma. left leg 03/09/2009  . Insomnia 10/03/2007  . Hyperlipidemia 12/13/2006  .  Essential hypertension 12/13/2006    [facility] ,MS, [place]  07/15/2020, 11:40 AM  [facility] [address] [place], [place], [address] Phone: [phone]   Fax:  [phone]   Name: BILLY FORCE MRN: [id number] Date of Birth: 30-Mar-1946

## 2020-07-15 NOTE — Therapy (Signed)
[facility] [address] [place], [place], [address] Phone: [phone]   Fax:  [phone]  Occupational Therapy Treatment  Patient Details  Name: Nicholas [name] MRN: [id number] Date of Birth: 11-02-1945 Referring Provider (OT): Dr. Tana Conch   Encounter Date: 07/15/2020   OT End of Session - 07/15/20 1122    Visit Number 3    Number of Visits 13    Date for OT Re-Evaluation 09/03/20    Authorization Type HTA 2021, copay per day, no auth needed    Authorization - Visit Number 3    Authorization - Number of Visits 10    OT Start Time 1104    OT Stop Time 1145    OT Time Calculation (min) 41 min    Activity Tolerance Patient tolerated treatment well    Behavior During Therapy [facility] for tasks assessed/performed           Past Medical History:  Diagnosis Date  . Cancer [facility]) Jan 2008   skin; hx of melanoma left foot/ amputation of toes 1&2   . Hyperlipidemia   . Hypertension   . Lymphedema     Past Surgical History:  Procedure Laterality Date  . 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 removal of melanoma of left foot/amputation of toes 1&25 Jul 2006  . WRIST FRACTURE SURGERY     74 years old-set    There were no vitals filed for this visit.                         OT Treatment/ Education - 07/15/20 1132    Education Details Pt was educated regarding adapted stratgies for eating and cutting food, use of foam grips,  reveiwed cards and coin tasks from HEP, min- mod v.c for large amplitude movements, handwriting strategies/ with practice, mod v.c , foam grip issued, pt does better printing .    Person(s) Educated Patient    Methods Explanation;Demonstration;Verbal cues;Handout;Tactile cues    Comprehension Verbalized understanding;Returned demonstration;Verbal cues required            OT Short Term Goals - 07/05/20 1542      OT SHORT TERM GOAL #1   Title Pt will be  independent with updated HEP.--check STGs 08/05/20    Time 4    Period Weeks    Status New      OT SHORT TERM GOAL #2   Title Pt will be able to write short paragraph and sign name with 100% legibility and only minimal decr in size use AE/strategies.    Time 4    Period Weeks    Status New      OT SHORT TERM GOAL #3   Title Pt will verbalize understanding of ways to decr risk of future complications related to PD.    Baseline --    Time 4    Period Weeks    Status New      OT SHORT TERM GOAL #4   Title --             OT Long Term Goals - 07/05/20 1543      OT LONG TERM GOAL #1   Title Pt will verbalize understanding of updated AE/strategies to incr safety, ease, and independence with ADLs/IADLs (including eating and grooming).-09/05/20    Time 8    Period Weeks    Status New      OT LONG TERM GOAL #  2   Title Pt will improve coordination for ADLs as shown by improving time on 9-hole peg test by at least 5sec with L hand.    Baseline L-43.44sec    Time 8    Period Weeks    Status New      OT LONG TERM GOAL #3   Title Pt will improve ability to dress/balance as shown by improving time on PPT#4 by at least 5sec.    Baseline 40.07sec once standing    Time 8    Period Weeks    Status New      OT LONG TERM GOAL #4   Title --      OT LONG TERM GOAL #5   Title --                 Plan - 07/15/20 1128    Clinical Impression Statement Pt is progressing towards goals. He demonstrates understanding of coordination HEP, however requires continued v.c for technique.    OT Occupational Profile and History Detailed Assessment- Review of Records and additional review of physical, cognitive, psychosocial history related to current functional performance    Occupational performance deficits (Please refer to evaluation for details): ADL's;IADL's;Leisure;Work;Social Participation    Body Structure / Function / Physical Skills ADL;Decreased knowledge of use of  DME;Tone;Dexterity;Balance;Edema;IADL;Endurance;Improper spinal/pelvic alignment;Coordination;Mobility;FMC;Decreased knowledge of precautions    Cognitive Skills Memory    Rehab Potential Good    Clinical Decision Making Several treatment options, min-mod task modification necessary    Comorbidities Affecting Occupational Performance: Presence of comorbidities impacting occupational performance    Comorbidities impacting occupational performance description: lymphedema, obesity    Modification or Assistance to Complete Evaluation  Min-Moderate modification of tasks or assist with assess necessary to complete eval    OT Frequency 2x / week    OT Duration 4 weeks   followed by 1x wk for 4 weeks +eval (may be modified due to scheduling concerns)   OT Treatment/Interventions Self-care/ADL training;Moist Heat;DME and/or AE instruction;Therapeutic activities;Aquatic Therapy;Cognitive remediation/compensation;Therapeutic exercise;Cryotherapy;Neuromuscular education;Functional Mobility Training;Passive range of motion;Patient/family education;Manual Therapy;Energy conservation    Plan ADL strategies, work towards goals    Consulted and Agree with Plan of Care Patient           Patient will benefit from skilled therapeutic intervention in order to improve the following deficits and impairments:   Body Structure / Function / Physical Skills: ADL,Decreased knowledge of use of DME,Tone,Dexterity,Balance,Edema,IADL,Endurance,Improper spinal/pelvic alignment,Coordination,Mobility,FMC,Decreased knowledge of precautions Cognitive Skills: Memory     Visit Diagnosis: Other symptoms and signs involving the nervous system  Other symptoms and signs involving the musculoskeletal system  Abnormal posture  Other lack of coordination  Unsteadiness on feet    Problem List Patient Active Problem List   Diagnosis Date Noted  . Cellulitis 04/12/2020  . Strain of right biceps 09/12/2017  . Lymphedema  02/10/2017  . BPH associated with nocturia 05/16/2016  . Senile purpura ([place]) 05/03/2016  . OSA (obstructive sleep apnea) 12/28/2015  . Hypersomnia 11/15/2015  . Cough 11/15/2015  . 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 Dizziness 12/30/2014  . Diastolic dysfunction 63/07/6008  . Former smoker 04/29/2014  . Chronic venous insufficiency 02/20/2014  . Hyperglycemia 08/02/2012  . Morbid obesity ([place]) 07/13/2010  . History of malignant melanoma. left leg 03/09/2009  . Insomnia 10/03/2007  . Hyperlipidemia 12/13/2006  . Essential hypertension 12/13/2006    Hector Venne 07/15/2020, 1:08 PM  [facility] [address] [place], [place], [address] Phone:  [phone]   Fax:  [phone]  Name: Nicholas [name] MRN: [id number] Date of Birth: [birth year]

## 2020-07-19 ENCOUNTER — Other Ambulatory Visit: Payer: Self-pay

## 2020-07-19 ENCOUNTER — Ambulatory Visit: Payer: PPO

## 2020-07-19 ENCOUNTER — Ambulatory Visit: Payer: PPO | Admitting: Occupational Therapy

## 2020-07-19 VITALS — BP 140/81 | HR 75

## 2020-07-19 DIAGNOSIS — R293 Abnormal posture: Secondary | ICD-10-CM

## 2020-07-19 DIAGNOSIS — R29898 Other symptoms and signs involving the musculoskeletal system: Secondary | ICD-10-CM

## 2020-07-19 DIAGNOSIS — R471 Dysarthria and anarthria: Secondary | ICD-10-CM

## 2020-07-19 DIAGNOSIS — R29818 Other symptoms and signs involving the nervous system: Secondary | ICD-10-CM

## 2020-07-19 DIAGNOSIS — R278 Other lack of coordination: Secondary | ICD-10-CM

## 2020-07-19 DIAGNOSIS — R4701 Aphasia: Secondary | ICD-10-CM

## 2020-07-19 DIAGNOSIS — R41841 Cognitive communication deficit: Secondary | ICD-10-CM

## 2020-07-19 DIAGNOSIS — R2681 Unsteadiness on feet: Secondary | ICD-10-CM

## 2020-07-19 DIAGNOSIS — R2689 Other abnormalities of gait and mobility: Secondary | ICD-10-CM

## 2020-07-19 NOTE — Therapy (Signed)
[facility] [address] [place], [place], [address] Phone: [phone]   Fax:  [phone]  Occupational Therapy Treatment  Patient Details  Name: DUSHAUN OKEY MRN: [id number] Date of Birth: [birth year] Referring Provider (OT): Dr. Tana Conch   Encounter Date: 07/19/2020   OT End of Session - 07/19/20 1109    Visit Number 4    Number of Visits 13    Date for OT Re-Evaluation 09/03/20    Authorization Type HTA 2021, copay per day, no auth needed    Authorization - Visit Number 4    Authorization - Number of Visits 10    Progress Note Due on Visit 10    OT Start Time 1105    OT Stop Time 1145    OT Time Calculation (min) 40 min    Activity Tolerance Patient tolerated treatment well    Behavior During Therapy [facility] for tasks assessed/performed           Past Medical History:  Diagnosis Date  . 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 Hyperlipidemia   . Hypertension   . Lymphedema     Past Surgical History:  Procedure Laterality Date  . 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 removal of melanoma of left foot/amputation of toes 1&25 Jul 2006  . WRIST FRACTURE SURGERY     74 years old-set    There were no vitals filed for this visit.   Subjective Assessment - 07/19/20 1107    Subjective  Pt reports that his fingertips are blue today, just noticed this morning.  Pt reports that foam built-up grips help with writing and eating.    Patient is accompanied by: --   aide   Pertinent History Parkinson's Plus Syndrome.   PMH: hx of malignant melanoma, hyperlipidemia, HTN, insomnia, obesity, hyperglycemia, dizziness, OSA, urinary urgency, lymphedema,chronic venous insufficiency, distolic dysfunction, hypersommnia, hx of L shoulder dislocation 2019, hx/hospitalization for cellulitis 03/2020    Patient Stated Goals be able to write (a check)    Currently in Pain? No/denies             Pt presents today with blue discoloration to fingertips/fingernails, particularly with R hand.  ST had monitored oxygen saturation and BP during session just prior to OT and vitals were WNL--see speech therapy note for details.  Assisted pt in taking pictures and recommended that pt contact MD and send pictures through my chart due to hx of lymphedema and sleep apnea.  Pt denies other symptoms.  Pt send message to my charts MD during session.  Writing Activities:  Practiced writing (printed) with min cueing for size and to slow down using foam grip.  Good size and legibility for address.  Pt demo min-mod decr in size for sentences and min decr in name.  Also practiced zig-zags, "O," vertical lines, and alphabet with mod cueing for using top and bottom lines as visual target, moving paper, and to slow down.  PWR! Hands (basic 4) with min cueing for large amplitude and for timing.  Fingertip coloration improved by end of the session after hand functional use.         OT Short Term Goals - 07/05/20 1542      OT SHORT TERM GOAL #1   Title Pt will be independent with updated HEP.--check STGs 08/05/20    Time 4    Period Weeks  Status New      OT SHORT TERM GOAL #2   Title Pt will be able to write short paragraph and sign name with 100% legibility and only minimal decr in size use AE/strategies.    Time 4    Period Weeks    Status New      OT SHORT TERM GOAL #3   Title Pt will verbalize understanding of ways to decr risk of future complications related to PD.    Baseline --    Time 4    Period Weeks    Status New      OT SHORT TERM GOAL #4   Title --             OT Long Term Goals - 07/05/20 1543      OT LONG TERM GOAL #1   Title Pt will verbalize understanding of updated AE/strategies to incr safety, ease, and independence with ADLs/IADLs (including eating and grooming).-09/05/20    Time 8    Period Weeks    Status New      OT LONG TERM GOAL #2   Title  Pt will improve coordination for ADLs as shown by improving time on 9-hole peg test by at least 5sec with L hand.    Baseline L-43.44sec    Time 8    Period Weeks    Status New      OT LONG TERM GOAL #3   Title Pt will improve ability to dress/balance as shown by improving time on PPT#4 by at least 5sec.    Baseline 40.07sec once standing    Time 8    Period Weeks    Status New      OT LONG TERM GOAL #4   Title --      OT LONG TERM GOAL #5   Title --                 Plan - 07/19/20 1109    Clinical Impression Statement Pt is progressing towards goals. He reports improved eating and writing with AE/strategies.    OT Occupational Profile and History Detailed Assessment- Review of Records and additional review of physical, cognitive, psychosocial history related to current functional performance    Occupational performance deficits (Please refer to evaluation for details): ADL's;IADL's;Leisure;Work;Social Participation    Body Structure / Function / Physical Skills ADL;Decreased knowledge of use of DME;Tone;Dexterity;Balance;Edema;IADL;Endurance;Improper spinal/pelvic alignment;Coordination;Mobility;FMC;Decreased knowledge of precautions    Cognitive Skills Memory    Rehab Potential Good    Clinical Decision Making Several treatment options, min-mod task modification necessary    Comorbidities Affecting Occupational Performance: Presence of comorbidities impacting occupational performance    Comorbidities impacting occupational performance description: lymphedema, obesity    Modification or Assistance to Complete Evaluation  Min-Moderate modification of tasks or assist with assess necessary to complete eval    OT Frequency 2x / week    OT Duration 4 weeks   followed by 1x wk for 4 weeks +eval (may be modified due to scheduling concerns)   OT Treatment/Interventions Self-care/ADL training;Moist Heat;DME and/or AE instruction;Therapeutic activities;Aquatic Therapy;Cognitive  remediation/compensation;Therapeutic exercise;Cryotherapy;Neuromuscular education;Functional Mobility Training;Passive range of motion;Patient/family education;Manual Therapy;Energy conservation    Plan ADL strategies, work towards goals    Consulted and Agree with Plan of Care Patient           Patient will benefit from skilled therapeutic intervention in order to improve the following deficits and impairments:   Body Structure / Function / Physical Skills: ADL,Decreased knowledge of  use of DME,Tone,Dexterity,Balance,Edema,IADL,Endurance,Improper spinal/pelvic alignment,Coordination,Mobility,FMC,Decreased knowledge of precautions Cognitive Skills: Memory     Visit Diagnosis: Other symptoms and signs involving the nervous system  Other symptoms and signs involving the musculoskeletal system  Abnormal posture  Other lack of coordination  Unsteadiness on feet  Other abnormalities of gait and mobility    Problem List Patient Active Problem List   Diagnosis Date Noted  . Cellulitis 04/12/2020  . Strain of right biceps 09/12/2017  . Lymphedema 02/10/2017  . BPH associated with nocturia 05/16/2016  . Senile purpura ([place]) 05/03/2016  . OSA (obstructive sleep apnea) 12/28/2015  . Hypersomnia 11/15/2015  . Cough 11/15/2015  . 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 Dizziness 12/30/2014  . Diastolic dysfunction 123456  . Former smoker 04/29/2014  . Chronic venous insufficiency 02/20/2014  . Hyperglycemia 08/02/2012  . Morbid obesity ([place]) 07/13/2010  . History of malignant melanoma. left leg 03/09/2009  . Insomnia 10/03/2007  . Hyperlipidemia 12/13/2006  . Essential hypertension 12/13/2006    [facility] 07/19/2020, 12:21 PM  [place] [address] [place], [place], [address] Phone: [phone]   Fax:  [phone]  Name: ACHYUTH WILBOURNE MRN: [id number] Date of Birth: 17-Apr-1946   Vianne Bulls, OTR/L [facility] [address] [place], [place]  [address] [phone] phone [phone] 07/19/20 12:21 PM

## 2020-07-19 NOTE — Therapy (Signed)
[place] [address] [place], [place], [address] Phone: [phone]   Fax:  [phone]  Speech Language Pathology Treatment  Patient Details  Name: Nicholas [name] MRN: [id number] Date of Birth: Feb 07, 1946 Referring Provider (SLP): Nicholas [name] Guiles, DO   Encounter Date: 07/19/2020   End of Session - 07/19/20 1214    Visit Number 4    Number of Visits 17    Date for SLP Re-Evaluation 09/07/20    SLP Start Time 24    SLP Stop Time  1100    SLP Time Calculation (min) 40 min    Activity Tolerance Patient tolerated treatment well           Past Medical History:  Diagnosis Date   Cancer [facility]) Jan 2008   skin; hx of melanoma left foot/ amputation of toes 1&2    Hyperlipidemia    Hypertension    Lymphedema     Past Surgical History:  Procedure Laterality Date   4th toe- 2nd primary melanoma  2009   removal of melanoma of left foot/amputation of toes 1&25 Jul 2006   WRIST FRACTURE SURGERY     74 years old-set    Vitals:   07/19/20 1048  BP: 140/81  Pulse: 75  SpO2: 100%     Subjective Assessment - 07/19/20 1030    Subjective "I did the loud "ah"s."    Currently in Pain? No/denies                 ADULT SLP TREATMENT - 07/19/20 1032      General Information   Behavior/Cognition Alert;Cooperative;Pleasant mood      Treatment Provided   Treatment provided Cognitive-Linquistic      Cognitive-Linquistic Treatment   Treatment focused on Cognition;Dysarthria    Skilled Treatment Pt loud /a/ average lower 90s dB. Pt fingernails turning blue after loud /a/, so SLP took pt's BP and SPO2 which were all WNL. (readings are located in vital signs) [name] did not bring everyday sentences today. Pt with explanation of idioms/expressions with average volume mid-upper 60s dB. After performing loud /a/ again pt's volume incr'd to upper 60s dB.      Assessment / Recommendations / Plan   Plan Continue  with current plan of care      Progression Toward Goals   Progression toward goals Progressing toward goals              SLP Short Term Goals - 07/19/20 1216      SLP SHORT TERM GOAL #1   Title pt will sustain loud /a/ at >90 dB at 30 cm over 3 consecutive sessions    Time 2    Period Weeks    Status On-going      SLP SHORT TERM GOAL #2   Title pt will exhibit volume of 70dB in sentence responses 90% over 3 sessions    Time 2    Period Weeks    Status On-going      SLP SHORT TERM GOAL #3   Title In 5 minutes simple conversation pt will achieve average speech volume of low 70s dB over two sessions    Time 2    Status On-going      SLP SHORT TERM GOAL #4   Title pt will undergo assessment of anomia if clinically indicated    Time 2    Period Weeks    Status On-going  SLP Long Term Goals - 07/19/20 1216      SLP LONG TERM GOAL #1   Title pt will sustain loud /a/ at average >90dB at 30 cm over 5 consecutive sessions    Time 6    Period Weeks   or 17 total sessions - for all LTGs   Status On-going      SLP LONG TERM GOAL #2   Title pt will generate speech in 8 minutes simple to mod complex conversation with average >70dB over three sessions    Time 6    Period Weeks    Status On-going      SLP LONG TERM GOAL #3   Title pt to undergo assessment of cognitive linguistics PRN and goals added PRN    Time 4    Period Weeks    Status On-going            Plan - 07/19/20 1215    Clinical Impression Statement Nicholas Curls "[place]" 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 Today, pt reported his fingernails turning blue after loud /a/. SLP took pt's SPO2 and BP and both were reported WNL for pt (see vital signs for more details). He reports he is not able to communicate with aid [name] or his wife as well as he could previously, due to his volume. See  "skilled intervention" for more details. I recommend skilled ST to address dysarthria, cognitive-communication and dysphagia. SLP to monitor diet tolerance; will request MBS orders and schedule objective testing if warranted.    Speech Therapy Frequency 2x / week    Duration --   8 weeks or 17 total visits   Treatment/Interventions Aspiration precaution training;Diet toleration management by SLP;Trials of upgraded texture/liquids;Cognitive reorganization;Multimodal communcation approach;Compensatory strategies;Pharyngeal strengthening exercises;Language facilitation;Compensatory techniques;Cueing hierarchy;Internal/external aids;Functional tasks;SLP instruction and feedback;Patient/family education;Other (comment)   MBS   Potential to Achieve Goals Good    SLP Home Exercise Plan loud /a/    Consulted and Agree with Plan of Care Patient           Patient will benefit from skilled therapeutic intervention in order to improve the following deficits and impairments:   Dysarthria and anarthria  Aphasia  Cognitive communication deficit    Problem List Patient Active Problem List   Diagnosis Date Noted   Cellulitis 04/12/2020   Strain of right biceps 09/12/2017   Lymphedema 02/10/2017   BPH associated with nocturia 05/16/2016   Senile purpura ([place]) 05/03/2016   OSA (obstructive sleep apnea) 12/28/2015   Hypersomnia 11/15/2015   Cough 11/15/2015   Parkinson's plus syndrome ([place]) 06/22/2015   Dizziness 95/62/1308   Diastolic dysfunction 65/78/4696   Former smoker 04/29/2014   Chronic venous insufficiency 02/20/2014   Hyperglycemia 08/02/2012   Morbid obesity ([place]) 07/13/2010   History of malignant melanoma. left leg 03/09/2009   Insomnia 10/03/2007   Hyperlipidemia 12/13/2006   Essential hypertension 12/13/2006    [facility] ,MS, CCC-SLP  07/19/2020, 12:17 PM  [place] [address] [place], [place], [address] Phone: [phone]   Fax:  [phone]   Name: Nicholas [name] MRN: [id number] Date of Birth: November 15, 1945

## 2020-07-21 ENCOUNTER — Other Ambulatory Visit: Payer: Self-pay

## 2020-07-21 ENCOUNTER — Encounter: Payer: PPO | Admitting: Occupational Therapy

## 2020-07-21 ENCOUNTER — Encounter: Payer: Self-pay | Admitting: Physical Therapy

## 2020-07-21 ENCOUNTER — Ambulatory Visit: Payer: PPO

## 2020-07-21 ENCOUNTER — Ambulatory Visit: Payer: PPO | Admitting: Physical Therapy

## 2020-07-21 DIAGNOSIS — R41841 Cognitive communication deficit: Secondary | ICD-10-CM

## 2020-07-21 DIAGNOSIS — R4701 Aphasia: Secondary | ICD-10-CM

## 2020-07-21 DIAGNOSIS — R2689 Other abnormalities of gait and mobility: Secondary | ICD-10-CM

## 2020-07-21 DIAGNOSIS — R471 Dysarthria and anarthria: Secondary | ICD-10-CM

## 2020-07-21 DIAGNOSIS — R2681 Unsteadiness on feet: Secondary | ICD-10-CM

## 2020-07-21 DIAGNOSIS — R29818 Other symptoms and signs involving the nervous system: Secondary | ICD-10-CM

## 2020-07-21 DIAGNOSIS — M6281 Muscle weakness (generalized): Secondary | ICD-10-CM

## 2020-07-21 NOTE — Therapy (Signed)
[facility] [address] [place], [place], [address] Phone: [phone]   Fax:  [phone]  Speech Language Pathology Treatment  Patient Details  Name: Nicholas [name] MRN: [id number] Date of Birth: 11/13/1945 Referring Provider (SLP): Kerin Salen, DO   Encounter Date: 07/21/2020   End of Session - 07/21/20 1218    Visit Number 5    Number of Visits 17    Date for SLP Re-Evaluation 09/07/20    SLP Start Time 1103    SLP Stop Time  1145    SLP Time Calculation (min) 42 min    Activity Tolerance Patient tolerated treatment well           Past Medical History:  Diagnosis Date  . Cancer [facility]) Jan 2008   skin; hx of melanoma left foot/ amputation of toes 1&2   . Hyperlipidemia   . Hypertension   . Lymphedema     Past Surgical History:  Procedure Laterality Date  . 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 removal of melanoma of left foot/amputation of toes 1&25 Jul 2006  . WRIST FRACTURE SURGERY     74 years old-set    There were no vitals filed for this visit.   Subjective Assessment - 07/21/20 1111    Subjective Gaylyn Rong states she has to aske pt to repeat at same frequency as prior to ST.    Patient is accompained by: --   Aid - Kris   Currently in Pain? No/denies                 ADULT SLP TREATMENT - 07/21/20 1112      General Information   Behavior/Cognition Alert;Cooperative;Pleasant mood      Treatment Provided   Treatment provided Cognitive-Linquistic      Cognitive-Linquistic Treatment   Treatment focused on Dysarthria;Cognition    Skilled Treatment "I'm wheezing a lot." Pt denies other overt s/sx aspiration PNA. Nida Boatman was somnolent usually during session today, requiring cool water to drink and verbal cues to maintain wakefulness. "I walked 12 feet in a minute 10 seconds. I wore me out!" Loud /a/ performed with mid 80s dB with usual mod A for full breath between productions. Pt forgot his  sentences at home so he recited 4 from memory with average upper 70s low 80s dB with usual min mod A for loudness. SLP assisted pt filling out the Speech related QOL measure adn pt provided WNL volume response (word response) 60% of the time, without awareness of lower thatn ANL volume. SLP told pt if he notices his volume is too low, he should not wait for the listener to request- he should just repeat. SLP provided pt with change combinations and pt told SLP change total 90% success with 80% of pt's 1-2 word responses with WNLvolume. SLP reiterated  strongly that fot pt to make progress he will need to consistently perform HEP and homework. SLP provided worksheets for pt to practice louder speech at word and phrase levels.      Assessment / Recommendations / Plan   Plan Continue with current plan of care      Progression Toward Goals   Progression toward goals Not progressing toward goals (comment)   ? pt's consistency with homework; somnolence           SLP Education - 07/21/20 1217    Education Details daily homework essential for progress, rationale for everyday sentences    Person(s) Educated Patient;Caregiver(s)  Methods Explanation    Comprehension Verbalized understanding            SLP Short Term Goals - 07/21/20 1221      SLP SHORT TERM GOAL #1   Title pt will sustain loud /a/ at >90 dB at 30 cm over 3 consecutive sessions    Time 2    Period Weeks    Status On-going      SLP SHORT TERM GOAL #2   Title pt will exhibit volume of 70dB in sentence responses 90% over 3 sessions    Time 2    Period Weeks    Status On-going      SLP SHORT TERM GOAL #3   Title In 5 minutes simple conversation pt will achieve average speech volume of low 70s dB over two sessions    Time 2    Status On-going      SLP SHORT TERM GOAL #4   Title pt will undergo assessment of anomia if clinically indicated    Time 2    Period Weeks    Status On-going            SLP Long Term Goals  - 07/21/20 1221      SLP LONG TERM GOAL #1   Title pt will sustain loud /a/ at average >90dB at 30 cm over 5 consecutive sessions    Time 6    Period Weeks   or 17 total sessions - for all LTGs   Status On-going      SLP LONG TERM GOAL #2   Title pt will generate speech in 8 minutes simple to mod complex conversation with average >70dB over three sessions    Time 6    Period Weeks    Status On-going      SLP LONG TERM GOAL #3   Title pt to undergo assessment of cognitive linguistics PRN and goals added PRN    Time 4    Period Weeks    Status On-going            Plan - 07/21/20 1218    Clinical Impression Statement Romona Curls "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 Pt was usually somnolent today (closed eyelids >5 seconds x10 doday's session) and SLP req'd to use verbal cues to wake pt x4 during session and also used cool water for pt to drink - pt awoke at other times between 4-10 seconds. Pt again forgot everyday sentences and told SLP he has not performed these as prescribed. See "skilled intervention" for more details. I recommend skilled ST to address dysarthria, cognitive-communication and dysphagia. SLP to monitor diet tolerance; will request MBS orders and schedule objective testing if warranted.    Speech Therapy Frequency 2x / week    Duration --   8 weeks or 17 total visits   Treatment/Interventions Aspiration precaution training;Diet toleration management by SLP;Trials of upgraded texture/liquids;Cognitive reorganization;Multimodal communcation approach;Compensatory strategies;Pharyngeal strengthening exercises;Language facilitation;Compensatory techniques;Cueing hierarchy;Internal/external aids;Functional tasks;SLP instruction and feedback;Patient/family education;Other (comment)   MBS   Potential to Achieve Goals Good    SLP Home Exercise Plan loud  /a/    Consulted and Agree with Plan of Care Patient           Patient will benefit from skilled therapeutic intervention in order to improve the following deficits and impairments:   Dysarthria and anarthria  Cognitive communication deficit  Aphasia    Problem  List Patient Active Problem List   Diagnosis Date Noted  . Cellulitis 04/12/2020  . Strain of right biceps 09/12/2017  . Lymphedema 02/10/2017  . BPH associated with nocturia 05/16/2016  . Senile purpura ([place]) 05/03/2016  . OSA (obstructive sleep apnea) 12/28/2015  . Hypersomnia 11/15/2015  . Cough 11/15/2015  . Parkinson's plus syndrome ([place]) 06/22/2015  . Dizziness 12/30/2014  . Diastolic dysfunction 123456  . Former smoker 04/29/2014  . Chronic venous insufficiency 02/20/2014  . Hyperglycemia 08/02/2012  . Morbid obesity ([place]) 07/13/2010  . History of malignant melanoma. left leg 03/09/2009  . Insomnia 10/03/2007  . Hyperlipidemia 12/13/2006  . Essential hypertension 12/13/2006    [facility], [place]  07/21/2020, 12:21 PM  [place] [address] [place], [place], [address] Phone: [phone]   Fax:  [phone]   Name: NAADIR HOKAMA MRN: [id number] Date of Birth: 1946-07-12

## 2020-07-21 NOTE — Patient Instructions (Signed)
   Do 5 loud "ah" twice daily, and your sentences daily.  Do 15 minutes of worksheets each day in a LOUD voice.

## 2020-07-21 NOTE — Patient Instructions (Signed)
Access Code: [email or web address] URL: [email or web address] Date: 07/21/2020 Prepared by: Lonia Blood  Exercises Seated March - 2 x daily - 5 x weekly - 2 sets - 10 reps Seated Long Arc Quad - 2 x daily - 5 x weekly - 1 sets - 10 reps Seated Active Hip Flexion - 2 x daily - 5 x weekly - 2 sets - 10 reps Seated Heel Toe Raises - 2 x daily - 5 x weekly - 2 sets - 10 reps Seated Hip Adduction Isometrics with Ball - 2 x daily - 5 x weekly - 2 sets - 5 reps

## 2020-07-21 NOTE — Therapy (Addendum)
[facility] [address] [place], [place], [address] Phone: [phone]   Fax:  [phone]   Discharge Note  PHYSICAL THERAPY DISCHARGE SUMMARY  Visits from Start of Care: 5  Current functional level related to goals / functional outcomes: Unable to assess due to unplanned discharge    Remaining deficits: Unable to assess due to unplanned discharge    Education / Equipment: Unable to assess due to unplanned discharge    Patient agrees to discharge. Patient goals were not met. Patient is being discharged due to not returning since the last visit.   Signing therapist has never treated or seen patient, but is discharging patient on behalf of last treating therapist secondary to long standing open episode that needs closing.  2:10 PM, 08/01/21 Jerene Pitch, DPT Physical Therapy with [facility]  [phone] office     Physical Therapy Treatment/PROGRESS NOTE/RECERT  Patient Details  Name: Nicholas [name] MRN: [id number] Date of Birth: January 29, 1946 Referring Provider (PT): [name], DO   Encounter Date: 07/21/2020   PT End of Session - 07/21/20 1238     Visit Number 5    Number of Visits 12    Date for PT Re-Evaluation 00/34/91   90 day cert; 8-wk POC including 07/21/2020 visit   Authorization Type HTA, VL:  [place] No auth    Progress Note Due on Visit --   PN completed at visit 5, 07/21/2020   PT Start Time 1018    PT Stop Time 1101    PT Time Calculation (min) 43 min    Equipment Utilized During Treatment Gait belt   used clinic bariatric walker, shoe covers for feet   Activity Tolerance Patient tolerated treatment well    Behavior During Therapy [facility] for tasks assessed/performed             Past Medical History:  Diagnosis Date   Cancer [facility]) Jan 2008   skin; hx of melanoma left foot/ amputation of toes 1&2    Hyperlipidemia    Hypertension    Lymphedema      Past Surgical History:  Procedure Laterality Date   4th toe- 2nd primary melanoma  2009   removal of melanoma of left foot/amputation of toes 1&25 Jul 2006   WRIST FRACTURE SURGERY     74 years old-set    There were no vitals filed for this visit.   Subjective Assessment - 07/21/20 1021     Subjective Got the power wheelchair and is getting really good at driving it.  No falls.  Still a few wounds, one on each leg-not fully open, just weeping.    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 Working on transfers    Currently in Pain? No/denies                               [facility] Adult PT Treatment/Exercise - 07/21/20 0001       Transfers   Transfers Sit to Stand;Stand to Sit    Sit to Stand 4: Min assist;With upper extremity assist;From chair/3-in-1    Sit to Stand Details Verbal  cues for sequencing;Verbal cues for technique;Tactile cues for posture;Tactile cues for placement;Verbal cues for safe use of DME/AE    Sit to Stand Details (indicate cue type and reason) From manual w/c x 3 reps during session.  Cues for forward lean, lift bottom to scoot forward/back in chair.    Stand to Sit 4: Min assist;With upper extremity assist;To chair/3-in-1    Stand to Sit Details (indicate cue type and reason) Tactile cues for initiation;Tactile cues for sequencing;Verbal cues for sequencing;Verbal cues for technique    Stand to Sit Details Cues to stick out buttocks and squat slowly to sit for improved positioning upon sitting in w/c.      Ambulation/Gait   Ambulation/Gait Yes    Ambulation/Gait Assistance 4: Min assist    Ambulation Distance (Feet) 15 Feet    Assistive device  Rollator    Gait Pattern Step-through pattern;Decreased step length - right;Decreased step length - left;Decreased dorsiflexion - right;Decreased dorsiflexion - left;Right foot flat;Left foot flat;Lateral trunk lean to right;Lateral trunk lean to left;Festinating;Shuffle;Step-to pattern;Poor foot clearance - left;Poor foot clearance - right    Ambulation Surface Level;Indoor    Gait Comments PT cues patient to shift weight laterally to one side, then take larger step.  Cues especially for larger step on LLE.  Pt takes 1 min, 15 sec to complete 180 degree turn; he requires cueing for 2 standing rest breaks when he begins to festinate.  PT provides cues to caregiver for SLOWED, deliberate weigthshifting to allow for improved foot clearance and improved step length.      Self-Care   Self-Care Other Self-Care Comments    Other Self-Care Comments  Discussed POC and progress towards goals; discussed pt's attendance and importance for consistent attendance for gains in therapy (pt has cancelled multiple visits and was last seen 06/09/2020).  Discussed pt's goals of getting back to aquatic therapy (cannot currently do this due to seeping wounds on BLEs) and caregiver's goals to help with upper body strength for transfers.             Therapeutic Exercise   Access Code: [email or web address] URL: [email or web address] Date: 05/28/2020 Prepared by: Janann August   Reviewed Exercises as part of HEP:  pt return demo understanding, but PT provides cues for slowed, deliberate performance.  Pt has several episodes of holding his breath during exercises, and PT provides cues to inhale on easy portion of exercises and to exhale during hard work of exercise.  Seated March - 2 x daily - 5 x weekly - 2 sets - 10 reps Seated Long Arc Quad - 2 x daily - 5 x weekly - 1 sets - 10 reps Seated Active Hip Flexion - 2 x daily - 5 x weekly - 2 sets - 10 reps Seated Heel Toe Raises - 2 x daily - 5 x weekly - 2 sets -  10 reps Seated Hip Adduction Isometrics with Ball - 2 x daily - 5 x weekly - 2 sets - 5 reps   PT provides cues throughout for sequencing breathing.  Assessed O2 sats several times through exercises, 95%, 97%.       PT Education - 07/21/20 1237     Education Details Reprinted HEP, with cues written in for breathing technique with exercises and technique for scooting to edge of w/c (lean forward, lift buttocks from chair and come forward with buttocks unweighted)    Person(s) Educated Patient;Caregiver(s)    Methods Explanation;Demonstration;Handout    Comprehension Verbalized understanding;Returned demonstration;Verbal cues required;Need  further instruction              PT Short Term Goals - 07/21/20 1242       PT SHORT TERM GOAL #1   Title Pt will be HEP with caregiver/family assist for improved strength, balance, transfers and gait.  TARGET 06/18/2020    Time 4    Period Weeks    Status Achieved      PT SHORT TERM GOAL #2   Title Pt will perform 4 of 5 transfers sit<>stand with min guard assist and no posterior lean, for improved ease and safety of trasnfers.    Baseline posterior lean, min assist and extra time    Time 4    Period Weeks    Status Not Met      PT SHORT TERM GOAL #3   Title Pt and caregiver will verbalize understanding of tips to reduce freezing and festination with short distance household gait, including doorways and turning.    Baseline able to verbalize tips, but still has difficulty with freezing and festination    Time 4    Period Weeks    Status Achieved      PT SHORT TERM GOAL #4   Title TUG score to be assessed and goal to be written as approrpiate.    Time 4    Period Weeks    Status New               PT Long Term Goals - 05/21/20 1819       PT LONG TERM GOAL #1   Title Pt will perform updated HEP to address strength, balance, transfers, gait for improved mobility and decreased fall risk.  TARGET 07/16/2020    Time 8     Period Weeks      PT LONG TERM GOAL #2   Title Pt will perform 8 of 10 reps of sit<>stand transfers with supervision, no posterior lean or LOB, for improved ease and safety with transfers.    Time 8    Period Weeks    Status New      PT LONG TERM GOAL #3   Title Pt will improve gait velocity to at least 1.6 ft/sec for decreased fall risk with household gait.    Time 8    Period Weeks    Status New      PT LONG TERM GOAL #4   Title Pt will verbalize plans for ongoing community fitness, including possible aquatic exercise as able to tolerate iwth lymphedema/wound hx, to maximize gains made in therapy.    Time 8    Period Weeks             Updated goals for recert:   PT Short Term Goals - 07/21/20 1252       PT SHORT TERM GOAL #1   Title Pt will perform progression of HEP with caregiver/family assist for improved strength, balance, transfers and gait.  TARGET 08/20/2020    Time 4    Period Weeks    Status Revised      PT SHORT TERM GOAL #2   Title Pt will perform 4 of 5 transfers sit<>stand with min guard assist and no posterior lean, for improved ease and safety of trasnfers.    Baseline posterior lean, min assist and extra time    Time 4    Period Weeks    Status On-going      PT SHORT TERM GOAL #3   Title Pt and caregiver  will verbalize and demo understanding of tips to reduce freezing and festination with short distance household gait, including doorways and turning.    Baseline able to verbalize tips, but still has difficulty with freezing and festination    Time 4    Period Weeks    Status Revised      PT SHORT TERM GOAL #4   Title Caregiver will report improved ease of scooting by 25% for improved initiation of sit>stand transfers.    Time 4    Period Weeks    Status New             PT Long Term Goals - 07/21/20 1254       PT LONG TERM GOAL #1   Title Pt will perform final HEP to address strength, balance, transfers, gait for improved mobility and  decreased fall risk.  TARGET 09/17/2020    Time 8    Period Weeks    Status Revised      PT LONG TERM GOAL #2   Title Pt will perform 180 degree turn with short distance gait, using rollator, in less than 1 minute,for improved ease of transfers and bathroom mobility.    Baseline 180 degree turn 1 min 15 sec    Time 8    Period Weeks    Status New      PT LONG TERM GOAL #3   Title Pt will improve gait velocity to at least 1.6 ft/sec for decreased fall risk with household gait.    Time 8    Period Weeks    Status On-going      PT LONG TERM GOAL #4   Title Pt will verbalize plans for ongoing community fitness, including possible aquatic exercise as able to tolerate iwth lymphedema/wound hx, to maximize gains made in therapy.    Time 8    Period Weeks    Status On-going                  Plan - 07/21/20 1243     Clinical Impression Statement PROGRESS NOTE/RECERT completed today, covering dates 05/21/2020-07/21/2020.  Pt has cancelled multiple PT appointments (due to work, not feeling well, caregiver not available), and has not been seen for PT since 06/09/2020.  Assessed STGs this visit,w tih pt meeting 2 of 3 goals.  He has met STG 1 for HEP and STG 3 for verbalizing techniques to reduce freezing.  However, cues given today for breathing techniques for ways to slow exercise for improved efficiency, and pt continues to have singificant festination/freezing episodes at times, especially with turns.  Pt took 1 min 15 seconds to turn 180 degrees from walking turn position with rollator.  He requires min assist for sit>stand and still has some posterior lean.  LTGs not assessed today, and all goals modified as needed.  Pt will continue to benefit from skilled PT to address strength, transfers, standing, and short distance gait for safety in home mobility.    Personal Factors and Comorbidities Comorbidity 3+    Comorbidities PMH melanoma s/p amputation toes on L foot, HTN, hyperlipidemia,  lymphedema    Examination-Activity Limitations Locomotion Level;Transfers;Stand    Examination-Participation Restrictions Community Activity;Other   Community exercise (PD spin class)   Stability/Clinical Decision Making Evolving/Moderate complexity    Rehab Potential Good    PT Frequency 1x / week   may need to recert/increase frequency when/if pt becomes candidate for aquatic therapy   PT Duration 8 weeks   including recert week  07/21/2020   PT Treatment/Interventions ADLs/Self Care Home Management;Aquatic Therapy;Gait training;Functional mobility training;Therapeutic activities;Therapeutic exercise;Balance training;Neuromuscular re-education;Patient/family education;Wheelchair mobility training    PT Next Visit Plan Review breathing techniques with his seated exercises; work on efficiency of scooting to edge of w/c and safety/technique with sit<>stand.  Practice short distance gait and turns (for transfers and bathroom mobility at home).  In standing, work on lateral weightshifting, marching; forward kicks    Consulted and Agree with Plan of Care Patient;Family member/caregiver    Family Member Consulted Gerald Stabs, caregiver             Patient will benefit from skilled therapeutic intervention in order to improve the following deficits and impairments:  Abnormal gait,Difficulty walking,Decreased safety awareness,Decreased balance,Decreased mobility,Decreased strength,Postural dysfunction  Visit Diagnosis: Other abnormalities of gait and mobility  Unsteadiness on feet  Muscle weakness (generalized)  Other symptoms and signs involving the nervous system     Problem List Patient Active Problem List   Diagnosis Date Noted   Cellulitis 04/12/2020   Strain of right biceps 09/12/2017   Lymphedema 02/10/2017   BPH associated with nocturia 05/16/2016   Senile purpura ([place]) 05/03/2016   OSA (obstructive sleep apnea) 12/28/2015   Hypersomnia 11/15/2015   Cough 11/15/2015   Parkinson's  plus syndrome ([place]) 06/22/2015   Dizziness 27/06/9289   Diastolic dysfunction 90/30/1499   Former smoker 04/29/2014   Chronic venous insufficiency 02/20/2014   Hyperglycemia 08/02/2012   Morbid obesity ([place]) 07/13/2010   History of malignant melanoma. left leg 03/09/2009   Insomnia 10/03/2007   Hyperlipidemia 12/13/2006   Essential hypertension 12/13/2006    Bernette Seeman W. 07/21/2020, 12:51 PM  [name]., PT  [facility] [address] [place], [place], [address] Phone: [phone]   Fax:  [phone]  Name: Nicholas [name] MRN: [id number] Date of Birth: Nov 24, 1945

## 2020-07-22 ENCOUNTER — Ambulatory Visit: Payer: PPO | Admitting: Occupational Therapy

## 2020-07-26 ENCOUNTER — Ambulatory Visit: Payer: PPO | Admitting: Occupational Therapy

## 2020-07-26 ENCOUNTER — Ambulatory Visit: Payer: PPO | Admitting: Physical Therapy

## 2020-07-26 ENCOUNTER — Ambulatory Visit: Payer: PPO | Admitting: Speech Pathology

## 2020-07-30 ENCOUNTER — Ambulatory Visit: Payer: PPO | Attending: Family Medicine

## 2020-07-30 ENCOUNTER — Other Ambulatory Visit: Payer: Self-pay

## 2020-07-30 DIAGNOSIS — R471 Dysarthria and anarthria: Secondary | ICD-10-CM | POA: Diagnosis not present

## 2020-07-30 NOTE — Patient Instructions (Signed)
  You need to practice at home 20 minutes a day in order to eventually carry over louder speech into conversation

## 2020-07-30 NOTE — Therapy (Signed)
[facility] [address] [place], [place], [address] Phone: [phone]   Fax:  [phone]  Speech Language Pathology Treatment  Patient Details  Name: Nicholas [name] MRN: [id number] Date of Birth: 23-Feb-1946 Referring Provider (SLP): [name], Wells Guiles, DO   Encounter Date: 07/30/2020   End of Session - 07/30/20 1340    Visit Number 6    Number of Visits 17    Date for SLP Re-Evaluation 09/07/20    SLP Start Time 1152    SLP Stop Time  1231    SLP Time Calculation (min) 39 min    Activity Tolerance Patient tolerated treatment well           Past Medical History:  Diagnosis Date  . Cancer [facility]) Jan 2008   skin; hx of melanoma left foot/ amputation of toes 1&2   . Hyperlipidemia   . Hypertension   . Lymphedema     Past Surgical History:  Procedure Laterality Date  . 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 removal of melanoma of left foot/amputation of toes 1&25 Jul 2006  . WRIST FRACTURE SURGERY     75 years old-set    There were no vitals filed for this visit.   Subjective Assessment - 07/30/20 1153    Subjective "I forgot my homework again." (everyday sentences)    Patient is accompained by: --   aid Vania Rea   Currently in Pain? No/denies                 ADULT SLP TREATMENT - 07/30/20 1154      General Information   Behavior/Cognition Alert;Cooperative;Pleasant mood      Treatment Provided   Treatment provided Cognitive-Linquistic      Cognitive-Linquistic Treatment   Treatment focused on Cognition;Dysarthria    Skilled Treatment Pt has struggled with consistency of loudness practice with sentence level responses at home so SLP explained rationale of daily work at home - necessary for carryover. Pt arrived speaking at a louder volume than at evaluation. SLP measured this conversation at upper 60s dB. After 60 seconds pt volume decreased to mid-upper 60s dB. He told SLP he was been faithful with  doing the loud /a/, however has not worked with everyday sentences. SLP reminded pt of idea to copy 2-3 times and place in other areas of the house so that on e will be closer than if he had just one copy. SLP again told pt rationale for everyday sentences. Pt loudeness was maintained after 3-4 reps of sentence responses with consistent SLP mod cues.      Assessment / Recommendations / Plan   Plan Continue with current plan of care      Progression Toward Goals   Progression toward goals Not progressing toward goals (comment)   SLP ?s pt's ability to complete tasks at home           SLP Education - 07/30/20 1340    Education Details rationale for everyday sentences, rationale for homework daily    Person(s) Educated Patient;Caregiver(s)    Methods Explanation    Comprehension Verbalized understanding            SLP Short Term Goals - 07/30/20 1341      SLP SHORT TERM GOAL #1   Title pt will sustain loud /a/ at >90 dB at 30 cm over 3 consecutive sessions    Period Weeks    Status Not Met  SLP SHORT TERM GOAL #2   Title pt will exhibit volume of 70dB in sentence responses 90% over 3 sessions    Time 1    Period Weeks    Status Not Met      SLP SHORT TERM GOAL #3   Title In 5 minutes simple conversation pt will achieve average speech volume of low 70s dB over two sessions    Status Not Met      SLP SHORT TERM GOAL #4   Title pt will undergo assessment of anomia if clinically indicated    Status Deferred            SLP Long Term Goals - 07/30/20 1344      SLP LONG TERM GOAL #1   Title pt will sustain loud /a/ at average >90dB at 30 cm over 5 consecutive sessions5    Time 5    Period Weeks   or 17 total sessions - for all LTGs   Status On-going      SLP LONG TERM GOAL #2   Title pt will generate speech in 8 minutes simple to mod complex conversation with average >70dB over three sessions    Time 5    Period Weeks    Status On-going      SLP LONG TERM GOAL  #3   Title pt to undergo assessment of cognitive linguistics PRN and goals added PRN    Time 5    Period Weeks    Status On-going            Plan - 07/30/20 1341    Clinical Impression Statement Romona Curls "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 Pt was usually somnolent today (closed eyelids >5 seconds x10 doday's session) and SLP req'd to use verbal cues to wake pt x4 during session and also used cool water for pt to drink - pt awoke at other times between 4-10 seconds. Pt again forgot everyday sentences and told SLP he has not performed these as prescribed. See "skilled intervention" for more details. I recommend skilled ST to address dysarthria, cognitive-communication and dysphagia. SLP to monitor diet tolerance; will request MBS orders and schedule objective testing if warranted.    Speech Therapy Frequency 2x / week    Duration --   8 weeks or 17 total visits   Treatment/Interventions Aspiration precaution training;Diet toleration management by SLP;Trials of upgraded texture/liquids;Cognitive reorganization;Multimodal communcation approach;Compensatory strategies;Pharyngeal strengthening exercises;Language facilitation;Compensatory techniques;Cueing hierarchy;Internal/external aids;Functional tasks;SLP instruction and feedback;Patient/family education;Other (comment)   MBS   Potential to Achieve Goals Good    SLP Home Exercise Plan loud /a/    Consulted and Agree with Plan of Care Patient           Patient will benefit from skilled therapeutic intervention in order to improve the following deficits and impairments:   Dysarthria and anarthria    Problem List Patient Active Problem List   Diagnosis Date Noted  . Cellulitis 04/12/2020  . Strain of right biceps 09/12/2017  . Lymphedema 02/10/2017  . BPH associated with nocturia 05/16/2016  . Senile purpura  ([place]) 05/03/2016  . OSA (obstructive sleep apnea) 12/28/2015  . Hypersomnia 11/15/2015  . Cough 11/15/2015  . Parkinson's plus syndrome ([place]) 06/22/2015  . Dizziness 12/30/2014  . Diastolic dysfunction 67/20/9470  . Former smoker 04/29/2014  . Chronic venous insufficiency 02/20/2014  . Hyperglycemia 08/02/2012  . Morbid obesity ([place]) 07/13/2010  . History  of malignant melanoma. left leg 03/09/2009  . Insomnia 10/03/2007  . Hyperlipidemia 12/13/2006  . Essential hypertension 12/13/2006    [facility] ,[place], [place]  07/30/2020, 1:45 PM  [facility] [address] [place], [place], [address] Phone: [phone]   Fax:  [phone]   Name: MAXTON NOREEN MRN: [id number] Date of Birth: [birth year]

## 2020-08-02 ENCOUNTER — Ambulatory Visit: Payer: PPO

## 2020-08-02 ENCOUNTER — Ambulatory Visit: Payer: PPO | Admitting: Occupational Therapy

## 2020-08-02 ENCOUNTER — Ambulatory Visit: Payer: PPO | Admitting: Physical Therapy

## 2020-08-04 ENCOUNTER — Ambulatory Visit: Payer: PPO

## 2020-08-06 DIAGNOSIS — R42 Dizziness and giddiness: Secondary | ICD-10-CM | POA: Diagnosis not present

## 2020-08-06 DIAGNOSIS — I890 Lymphedema, not elsewhere classified: Secondary | ICD-10-CM | POA: Diagnosis not present

## 2020-08-06 DIAGNOSIS — G232 Striatonigral degeneration: Secondary | ICD-10-CM | POA: Diagnosis not present

## 2020-08-06 DIAGNOSIS — S46211A Strain of muscle, fascia and tendon of other parts of biceps, right arm, initial encounter: Secondary | ICD-10-CM | POA: Diagnosis not present

## 2020-08-10 ENCOUNTER — Ambulatory Visit: Payer: PPO | Admitting: Physical Therapy

## 2020-08-10 ENCOUNTER — Ambulatory Visit: Payer: PPO

## 2020-08-10 ENCOUNTER — Ambulatory Visit: Payer: PPO | Admitting: Occupational Therapy

## 2020-08-11 ENCOUNTER — Telehealth (INDEPENDENT_AMBULATORY_CARE_PROVIDER_SITE_OTHER): Payer: PPO | Admitting: Family Medicine

## 2020-08-11 ENCOUNTER — Encounter: Payer: Self-pay | Admitting: Family Medicine

## 2020-08-11 VITALS — Wt 270.0 lb

## 2020-08-11 DIAGNOSIS — G232 Striatonigral degeneration: Secondary | ICD-10-CM | POA: Diagnosis not present

## 2020-08-11 DIAGNOSIS — E785 Hyperlipidemia, unspecified: Secondary | ICD-10-CM | POA: Diagnosis not present

## 2020-08-11 DIAGNOSIS — D692 Other nonthrombocytopenic purpura: Secondary | ICD-10-CM | POA: Diagnosis not present

## 2020-08-11 DIAGNOSIS — R739 Hyperglycemia, unspecified: Secondary | ICD-10-CM

## 2020-08-11 DIAGNOSIS — I890 Lymphedema, not elsewhere classified: Secondary | ICD-10-CM

## 2020-08-11 DIAGNOSIS — G20C Parkinsonism, unspecified: Secondary | ICD-10-CM

## 2020-08-11 NOTE — Patient Instructions (Addendum)
There are no preventive care reminders to display for this patient.  Recommended follow up: No follow-ups on file. 

## 2020-08-11 NOTE — Progress Notes (Addendum)
Phone [phone] Virtual visit via Video note   Subjective:  Chief complaint: Chief Complaint  Patient presents with  . Hyperlipidemia  . Hypertension    This visit type was conducted due to national recommendations for restrictions regarding the COVID-19 Pandemic (e.g. social distancing).  This format is felt to be most appropriate for this patient at this time balancing risks to patient and risks to population by having him in for in person visit.  No physical exam was performed (except for noted visual exam or audio findings with Telehealth visits).    Our team/I connected with Nicholas [name] at 11:00 AM EST by a video enabled telemedicine application (doxy.me or caregility through epic) and verified that I am speaking with the correct person using two identifiers.  Location patient: [place] Location provider: [facility], office Persons participating in the virtual visit:  patient  Our team/I discussed the limitations of evaluation and management by telemedicine and the availability of in person appointments. In light of current covid-19 pandemic, patient also understands that we are trying to protect them by minimizing in office contact if at all possible.  The patient expressed consent for telemedicine visit and agreed to proceed. Patient understands insurance will be billed.   Past Medical History-  Patient Active Problem List   Diagnosis Date Noted  . Lymphedema 02/10/2017    Priority: High  . 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 History of malignant melanoma. left leg 03/09/2009    Priority: High  . BPH associated with nocturia 05/16/2016    Priority: Medium  . OSA (obstructive sleep apnea) 12/28/2015    Priority: Medium  . Diastolic dysfunction 64/33/2951    Priority: Medium  . Chronic venous insufficiency 02/20/2014    Priority: Medium  . Hyperglycemia 08/02/2012    Priority: Medium  . Hyperlipidemia 12/13/2006    Priority: Medium  .  Essential hypertension 12/13/2006    Priority: Medium  . Senile purpura ([place]) 05/03/2016    Priority: Low  . Hypersomnia 11/15/2015    Priority: Low  . Former smoker 04/29/2014    Priority: Low  . Morbid obesity ([place]) 07/13/2010    Priority: Low  . Insomnia 10/03/2007    Priority: Low  . Cellulitis 04/12/2020  . Strain of right biceps 09/12/2017  . Cough 11/15/2015  . Dizziness 12/30/2014    Medications- reviewed and updated Current Outpatient Medications  Medication Sig Dispense Refill  . carbidopa-levodopa (SINEMET IR) 25-100 MG tablet TAKE 2 TABLETS AT 7AM,11AM, 3PM, AND AT 7PM. 720 tablet 1  . rosuvastatin (CRESTOR) 10 MG tablet TAKE 1 TABLET ONCE DAILY. 90 tablet 0   No current facility-administered medications for this visit.     Objective:  Wt 270 lb (122.5 kg)   BMI 43.58 kg/m  self reported vitals Gen: NAD, resting comfortably in his motorized wheelchair Lungs: nonlabored, normal respiratory rate  Skin: appears dry, no obvious rash     Assessment and Plan   #Parkinson's plus syndrome-followed by Dr. Carles Collet of [place] neurology S: Medication: Carbidopa levodopa 25-100 mg-patient takes 2 tablets at 7 AM, 11 AM, 3 PM and 7 PM  Patient has caregiver Monday Wednesday Friday.  Wife helps him the other days.  Is able to stand and walk short distances with walker but most of the time is in motorized wheelchair.  Patient reports he has been working with [facility] urological I believe but states very hard to get there.  He also has been seeing speech,  occupational therapy, physical therapy as well-this is all really helped him but hard to get there  Patient also has some incontinence issues A/P: Parkinson's overall stable-with difficulty getting to therapy sessions-we will try to get this set up at [place] for physical, educational, speech therapy ordered today  #Lymphedema with history of malignant melanoma left leg S: Severe lymphedema after history of  malignant melanoma in the left leg.  Has required treatment at lymphedema clinic with [place] in the past.  He is trying to secure similar treatment at [facility] due to difficulty of travel  A/P: Persistent issues-refer to physical therapy at [facility] wonder if he will ultimately have to go back to physical therapy with [facility] but I understand traveling issues   #hyperlipidemia S: Medication: Crestor 10 mg A/P: Well-controlled last March-we will recheck next visit   # Hyperglycemia/insulin resistance/prediabetes-peak A1c 5.29 September 2019 S:  Medication: None Exercise and diet-exercise limited with Parkinson's A/P: We discussed importance of healthy eating/exercise as able-recheck next visit with A1c  #hypertension S: medication: None at this point-has been taken off medication Home readings #s: No recent checks  BP Readings from Last 3 Encounters:  07/19/20 140/81  07/13/20 140/84  05/28/20 136/76    A/P: Blood pressure has been high on last few checks-asked patient to do some home monitoring-he will have to find his old cuff since his move and if over 140/90 he should follow-up with [place] prior to 51-month visit   #Senile purpura-easy bruising on forearms-we will plan on updated CBC at least once yearly.  Patient reports stable   Recommended follow up: Sent patient a MyChart message to suggest 22-month follow-up Future Appointments  Date Time Provider [place]  01/11/2021  3:30 PM [name], [name], DO [facility] None  05/16/2021  2:30 PM [facility] HEALTH COACH [facility]    Lab/Order associations:   ICD-10-CM   1. 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 Lymphedema  I89.0 Ambulatory referral to Physical Therapy  3. Hyperlipidemia, unspecified hyperlipidemia type  E78.5   4. Hyperglycemia   R73.9   5. Senile purpura ([place])  D69.2       Return precautions advised.  [name], MD

## 2020-08-13 ENCOUNTER — Ambulatory Visit: Payer: PPO

## 2020-08-16 ENCOUNTER — Ambulatory Visit: Payer: PPO

## 2020-08-16 ENCOUNTER — Ambulatory Visit: Payer: PPO | Admitting: Physical Therapy

## 2020-08-16 ENCOUNTER — Ambulatory Visit: Payer: PPO | Admitting: Occupational Therapy

## 2020-08-30 ENCOUNTER — Telehealth: Payer: Self-pay

## 2020-08-30 DIAGNOSIS — I872 Venous insufficiency (chronic) (peripheral): Secondary | ICD-10-CM | POA: Diagnosis not present

## 2020-08-30 DIAGNOSIS — G4733 Obstructive sleep apnea (adult) (pediatric): Secondary | ICD-10-CM | POA: Diagnosis not present

## 2020-08-30 DIAGNOSIS — R739 Hyperglycemia, unspecified: Secondary | ICD-10-CM | POA: Diagnosis not present

## 2020-08-30 DIAGNOSIS — R7303 Prediabetes: Secondary | ICD-10-CM | POA: Diagnosis not present

## 2020-08-30 DIAGNOSIS — D692 Other nonthrombocytopenic purpura: Secondary | ICD-10-CM | POA: Diagnosis not present

## 2020-08-30 DIAGNOSIS — I890 Lymphedema, not elsewhere classified: Secondary | ICD-10-CM | POA: Diagnosis not present

## 2020-08-30 DIAGNOSIS — I10 Essential (primary) hypertension: Secondary | ICD-10-CM | POA: Diagnosis not present

## 2020-08-30 DIAGNOSIS — N400 Enlarged prostate without lower urinary tract symptoms: Secondary | ICD-10-CM | POA: Diagnosis not present

## 2020-08-30 DIAGNOSIS — E785 Hyperlipidemia, unspecified: Secondary | ICD-10-CM | POA: Diagnosis not present

## 2020-08-30 DIAGNOSIS — Z6841 Body Mass Index (BMI) 40.0 and over, adult: Secondary | ICD-10-CM | POA: Diagnosis not present

## 2020-08-30 DIAGNOSIS — Z87891 Personal history of nicotine dependence: Secondary | ICD-10-CM | POA: Diagnosis not present

## 2020-08-30 DIAGNOSIS — Z791 Long term (current) use of non-steroidal anti-inflammatories (NSAID): Secondary | ICD-10-CM | POA: Diagnosis not present

## 2020-08-30 DIAGNOSIS — G20 Parkinson's disease: Secondary | ICD-10-CM | POA: Diagnosis not present

## 2020-08-30 DIAGNOSIS — G4710 Hypersomnia, unspecified: Secondary | ICD-10-CM | POA: Diagnosis not present

## 2020-08-30 NOTE — Telephone Encounter (Signed)
Home Health Verbal Orders  Agency: [facility]    Requesting OT/ PT/ Skilled nursing/ Social Work/ Speech:  Speech   Frequency:  Once a week for 4 weeks.

## 2020-08-31 NOTE — Telephone Encounter (Signed)
Called and spoke with Nicholas [name] and VO given.

## 2020-09-02 ENCOUNTER — Telehealth: Payer: Self-pay

## 2020-09-02 NOTE — Telephone Encounter (Signed)
Home Health Verbal Orders  Agency:  [facility]   Requesting:  [place] and PT   Frequency:  Once a week for one week, twice a week for four weeks then once a week for four weeks.

## 2020-09-02 NOTE — Telephone Encounter (Signed)
Called and lm on [place]acie vm with VO.

## 2020-09-04 NOTE — Progress Notes (Signed)
Phone [phone] Virtual visit via Video note   Subjective:  Chief complaint: Chief Complaint  Patient presents with  . Wheezing    This has been going on for a couples of weeks now, States that nothing makes it better or worst but in the morning is when the wheezing is worst.     This visit type was conducted due to national recommendations for restrictions regarding the COVID-19 Pandemic (e.g. social distancing).  This format is felt to be most appropriate for this patient at this time balancing risks to patient and risks to population by having him in for in person visit.  No physical exam was performed (except for noted visual exam or audio findings with Telehealth visits).    Our team/I connected with Nicholas [name] at  3:00 PM EST by a video enabled telemedicine application (doxy.me or caregility through epic) and verified that I am speaking with the correct person using two identifiers.  Location patient: Home-[place]2 Location provider: [facility], office Persons participating in the virtual visit:  patient  Our team/I discussed the limitations of evaluation and management by telemedicine and the availability of in person appointments. In light of current covid-19 pandemic, patient also understands that we are trying to protect them by minimizing in office contact if at all possible.  The patient expressed consent for telemedicine visit and agreed to proceed. Patient understands insurance will be billed.   Past Medical History-  Patient Active Problem List   Diagnosis Date Noted  . Lymphedema 02/10/2017    Priority: High  . 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 History of malignant melanoma. left leg 03/09/2009    Priority: High  . BPH associated with nocturia 05/16/2016    Priority: Medium  . OSA (obstructive sleep apnea) 12/28/2015    Priority: Medium  . Diastolic dysfunction 24/58/0998    Priority: Medium  . Chronic venous insufficiency  02/20/2014    Priority: Medium  . Hyperglycemia 08/02/2012    Priority: Medium  . Hyperlipidemia 12/13/2006    Priority: Medium  . Essential hypertension 12/13/2006    Priority: Medium  . Senile purpura ([place]) 05/03/2016    Priority: Low  . Hypersomnia 11/15/2015    Priority: Low  . Former smoker 04/29/2014    Priority: Low  . Morbid obesity ([place]) 07/13/2010    Priority: Low  . Insomnia 10/03/2007    Priority: Low  . Cellulitis 04/12/2020  . Strain of right biceps 09/12/2017  . Cough 11/15/2015  . Dizziness 12/30/2014   Medications- reviewed and updated Current Outpatient Medications  Medication Sig Dispense Refill  . albuterol (VENTOLIN HFA) 108 (90 Base) MCG/ACT inhaler Inhale 2 puffs into the lungs every 6 (six) hours as needed for wheezing or shortness of breath. 1 each 1  . carbidopa-levodopa (SINEMET IR) 25-100 MG tablet TAKE 2 TABLETS AT 7AM,11AM, 3PM, AND AT 7PM. 720 tablet 1  . rosuvastatin (CRESTOR) 10 MG tablet TAKE 1 TABLET ONCE DAILY. 90 tablet 0   No current facility-administered medications for this visit.     Objective:  Ht 5\' 6"  (1.676 m)   Wt 270 lb (122.5 kg)   BMI 43.58 kg/m  self reported vitals Gen: NAD, resting comfortably Lungs: nonlabored, normal respiratory rate  Skin: appears dry, no obvious rash    Assessment and Plan   Wheezing S:no history of asthma. Quit smoking in 1990s and under 20 pack years- do not strongly suspect COPD.    A few weeks  of symptoms- worse in the morning. Also some in the day as well if exercising. Feeling some winded. Lymphedema is about the same.   Has not had any congestion or cough on a regular basis. Did cough up some phlegm a few days a go but not large volume and not recurrent.   After eating nose runs - ongoing last year  No trouble swallowing/chewing A/P: New onset wheezing for several weeks and 75 year old male with Parkinson's -Parkinson's can be associated with respiratory issues -No asthma history.   No known COPD history but quit smoking in 1990s-we will trial albuterol -If fails to improve within a week we will get chest x-ray -No increased edema-do not strongly suspect CHF but does have history of diastolic dysfunction -With shortness of breath would not suspect GERD alone would cause this -No history of aspiration-do not strongly suspect aspiration affects  Recommended follow up: As needed if symptoms fail to improve Future Appointments  Date Time Provider [place]  01/11/2021  3:30 PM [name], [name], DO [facility] None  05/16/2021  2:30 PM [facility] HEALTH COACH [facility]    Lab/Order associations:   ICD-10-CM   1. Wheezing  R06.2     Meds ordered this encounter  Medications  . albuterol (VENTOLIN HFA) 108 (90 Base) MCG/ACT inhaler    Sig: Inhale 2 puffs into the lungs every 6 (six) hours as needed for wheezing or shortness of breath.    Dispense:  1 each    Refill:  1   Return precautions advised.  [name], MD

## 2020-09-04 NOTE — Patient Instructions (Addendum)
  Depression screen [facility] 2/9 09/06/2020 05/14/2020 12/12/2019  Decreased Interest 0 0 0  Down, Depressed, Hopeless 0 0 0  PHQ - 2 Score 0 0 0  Altered sleeping - - -  Tired, decreased energy - - -  Change in appetite - - -  Feeling bad or failure about yourself  - - -  Trouble concentrating - - -  Moving slowly or fidgety/restless - - -  Suicidal thoughts - - -  PHQ-9 Score - - -  Difficult doing work/chores - - -  Some recent data might be hidden    Recommended follow up: No follow-ups on file.

## 2020-09-06 ENCOUNTER — Other Ambulatory Visit: Payer: Self-pay | Admitting: Family Medicine

## 2020-09-06 ENCOUNTER — Telehealth (INDEPENDENT_AMBULATORY_CARE_PROVIDER_SITE_OTHER): Payer: PPO | Admitting: Family Medicine

## 2020-09-06 ENCOUNTER — Encounter: Payer: Self-pay | Admitting: Family Medicine

## 2020-09-06 VITALS — Ht 66.0 in | Wt 270.0 lb

## 2020-09-06 DIAGNOSIS — I5189 Other ill-defined heart diseases: Secondary | ICD-10-CM

## 2020-09-06 DIAGNOSIS — R42 Dizziness and giddiness: Secondary | ICD-10-CM | POA: Diagnosis not present

## 2020-09-06 DIAGNOSIS — G232 Striatonigral degeneration: Secondary | ICD-10-CM | POA: Diagnosis not present

## 2020-09-06 DIAGNOSIS — S46211A Strain of muscle, fascia and tendon of other parts of biceps, right arm, initial encounter: Secondary | ICD-10-CM | POA: Diagnosis not present

## 2020-09-06 DIAGNOSIS — R062 Wheezing: Secondary | ICD-10-CM | POA: Diagnosis not present

## 2020-09-06 DIAGNOSIS — Z87891 Personal history of nicotine dependence: Secondary | ICD-10-CM | POA: Diagnosis not present

## 2020-09-06 DIAGNOSIS — I890 Lymphedema, not elsewhere classified: Secondary | ICD-10-CM | POA: Diagnosis not present

## 2020-09-06 MED ORDER — ALBUTEROL SULFATE HFA 108 (90 BASE) MCG/ACT IN AERS: 2.0000 | INHALATION_SPRAY | Freq: Four times a day (QID) | RESPIRATORY_TRACT | 1 refills | Status: DC | PRN

## 2020-09-09 ENCOUNTER — Telehealth: Payer: Self-pay

## 2020-09-09 NOTE — Telephone Encounter (Signed)
Called and spoke with [facility] and orders provided.

## 2020-09-09 NOTE — Telephone Encounter (Signed)
[facility] is requesting an order for nursing. Pt has open wounds from not taking care of his Lymphedema   [phone]- Maggie

## 2020-09-13 ENCOUNTER — Telehealth: Payer: Self-pay

## 2020-09-13 DIAGNOSIS — S81802A Unspecified open wound, left lower leg, initial encounter: Secondary | ICD-10-CM

## 2020-09-13 NOTE — Telephone Encounter (Signed)
Is it okay for me to give there verbal orders?

## 2020-09-13 NOTE — Telephone Encounter (Signed)
[facility] is requesting verbal orders for pt for  Wound care  1x a week for 1 week 2x a week for 2 weeks 1x a week for 5 weeks 1 PRN   Has pt had abi within last 6 months?   The wounds on pt legs, are they Stasis Ulcers?  They are going to do opticell AG to treat pt.'s legs.   [place]

## 2020-09-13 NOTE — Telephone Encounter (Signed)
I ordered ABIs- We will call you within two weeks about your referral for these. If you do not hear within 2 weeks, give [place] a call. Team double check with referral folks to make sure this will process  Can provide verbal orders

## 2020-09-13 NOTE — Addendum Note (Signed)
Addended by: [name] on: 09/13/2020 07:46 PM   Modules accepted: Orders

## 2020-09-16 NOTE — Telephone Encounter (Signed)
Has this processed?

## 2020-09-16 NOTE — Telephone Encounter (Signed)
Looks like they reached out to the patient today to schedule the ABIs. They were unable to get in touch with the patient and left a voicemail.

## 2020-09-17 ENCOUNTER — Other Ambulatory Visit: Payer: Self-pay

## 2020-09-17 ENCOUNTER — Ambulatory Visit (HOSPITAL_COMMUNITY)
Admission: RE | Admit: 2020-09-17 | Discharge: 2020-09-17 | Disposition: A | Discharge status: Home / Self Care | Payer: PPO | Source: Ambulatory Visit | Attending: Family Medicine | Admitting: Family Medicine

## 2020-09-17 DIAGNOSIS — S81802A Unspecified open wound, left lower leg, initial encounter: Secondary | ICD-10-CM | POA: Diagnosis not present

## 2020-09-20 ENCOUNTER — Telehealth: Payer: Self-pay

## 2020-09-20 NOTE — Telephone Encounter (Signed)
Patient states he missed a call from two people and states no VM was left.

## 2020-09-27 ENCOUNTER — Telehealth: Payer: Self-pay

## 2020-09-27 NOTE — Telephone Encounter (Signed)
[facility] previously [facility] is requesting  ABI results ( index) be called to them   [name]  Also. Pt fell last night without injury   Ok to leave voicemail

## 2020-09-28 NOTE — Telephone Encounter (Signed)
Glad no injury  Look up ABIs from 09/17/20 I believe and give results to them if ok with patient

## 2020-09-28 NOTE — Telephone Encounter (Signed)
Patient is okay with me giving the results to them. Faxed over a copy.

## 2020-09-28 NOTE — Telephone Encounter (Signed)
FYI, see below.

## 2020-10-04 DIAGNOSIS — G232 Striatonigral degeneration: Secondary | ICD-10-CM | POA: Diagnosis not present

## 2020-10-04 DIAGNOSIS — S46211A Strain of muscle, fascia and tendon of other parts of biceps, right arm, initial encounter: Secondary | ICD-10-CM | POA: Diagnosis not present

## 2020-10-04 DIAGNOSIS — I890 Lymphedema, not elsewhere classified: Secondary | ICD-10-CM | POA: Diagnosis not present

## 2020-10-04 DIAGNOSIS — R42 Dizziness and giddiness: Secondary | ICD-10-CM | POA: Diagnosis not present

## 2020-10-08 ENCOUNTER — Telehealth: Payer: Self-pay

## 2020-10-08 NOTE — Telephone Encounter (Signed)
[facility] is requesting verbal orders to-  Move lymphedema eval to next week, as the pt was not available this week when the OT could go.   [phone]. Ok to leave voicemail

## 2020-10-08 NOTE — Telephone Encounter (Signed)
Called and lm with VO.

## 2020-10-20 ENCOUNTER — Encounter (HOSPITAL_COMMUNITY): Payer: Self-pay

## 2020-10-20 ENCOUNTER — Other Ambulatory Visit: Payer: Self-pay

## 2020-10-20 ENCOUNTER — Emergency Department (HOSPITAL_COMMUNITY): Payer: PPO

## 2020-10-20 ENCOUNTER — Emergency Department (HOSPITAL_COMMUNITY)
Admission: EM | Admit: 2020-10-20 | Discharge: 2020-10-20 | Disposition: A | Discharge status: Home / Self Care | Payer: PPO | Attending: Emergency Medicine | Admitting: Emergency Medicine

## 2020-10-20 DIAGNOSIS — S91111A Laceration without foreign body of right great toe without damage to nail, initial encounter: Secondary | ICD-10-CM | POA: Insufficient documentation

## 2020-10-20 DIAGNOSIS — S81811A Laceration without foreign body, right lower leg, initial encounter: Secondary | ICD-10-CM | POA: Diagnosis not present

## 2020-10-20 DIAGNOSIS — Z79899 Other long term (current) drug therapy: Secondary | ICD-10-CM | POA: Insufficient documentation

## 2020-10-20 DIAGNOSIS — R609 Edema, unspecified: Secondary | ICD-10-CM | POA: Insufficient documentation

## 2020-10-20 DIAGNOSIS — S91011A Laceration without foreign body, right ankle, initial encounter: Secondary | ICD-10-CM | POA: Insufficient documentation

## 2020-10-20 DIAGNOSIS — Z87891 Personal history of nicotine dependence: Secondary | ICD-10-CM | POA: Diagnosis not present

## 2020-10-20 DIAGNOSIS — Z85828 Personal history of other malignant neoplasm of skin: Secondary | ICD-10-CM | POA: Insufficient documentation

## 2020-10-20 DIAGNOSIS — S8991XA Unspecified injury of right lower leg, initial encounter: Secondary | ICD-10-CM | POA: Diagnosis not present

## 2020-10-20 DIAGNOSIS — Y92129 Unspecified place in nursing home as the place of occurrence of the external cause: Secondary | ICD-10-CM | POA: Diagnosis not present

## 2020-10-20 DIAGNOSIS — M79671 Pain in right foot: Secondary | ICD-10-CM | POA: Diagnosis not present

## 2020-10-20 DIAGNOSIS — I10 Essential (primary) hypertension: Secondary | ICD-10-CM | POA: Diagnosis not present

## 2020-10-20 DIAGNOSIS — W228XXA Striking against or struck by other objects, initial encounter: Secondary | ICD-10-CM | POA: Diagnosis not present

## 2020-10-20 DIAGNOSIS — R58 Hemorrhage, not elsewhere classified: Secondary | ICD-10-CM | POA: Diagnosis not present

## 2020-10-20 DIAGNOSIS — M7989 Other specified soft tissue disorders: Secondary | ICD-10-CM | POA: Diagnosis not present

## 2020-10-20 DIAGNOSIS — Z23 Encounter for immunization: Secondary | ICD-10-CM | POA: Diagnosis not present

## 2020-10-20 LAB — CBC
HCT: 43.8 % (ref 39.0–52.0)
Hemoglobin: 13.8 g/dL (ref 13.0–17.0)
MCH: 30.1 pg (ref 26.0–34.0)
MCHC: 31.5 g/dL (ref 30.0–36.0)
MCV: 95.4 fL (ref 80.0–100.0)
Platelets: 160 10*3/uL (ref 150–400)
RBC: 4.59 MIL/uL (ref 4.22–5.81)
RDW: 13.5 % (ref 11.5–15.5)
WBC: 6.2 10*3/uL (ref 4.0–10.5)
nRBC: 0 % (ref 0.0–0.2)

## 2020-10-20 MED ORDER — TETANUS-DIPHTH-ACELL PERTUSSIS 5-2.5-18.5 LF-MCG/0.5 IM SUSY
0.5000 mL | PREFILLED_SYRINGE | Freq: Once | INTRAMUSCULAR | Status: AC
  Administered 2020-10-20: 0.5 mL via INTRAMUSCULAR
  Filled 2020-10-20: qty 0.5

## 2020-10-20 MED ORDER — CEPHALEXIN 500 MG PO CAPS: 500.0000 mg | ORAL_CAPSULE | Freq: Two times a day (BID) | ORAL | 0 refills | Status: DC

## 2020-10-20 MED ORDER — LIDOCAINE HCL (PF) 1 % IJ SOLN
30.0000 mL | Freq: Once | INTRAMUSCULAR | Status: AC
  Administered 2020-10-20: 30 mL
  Filled 2020-10-20: qty 30

## 2020-10-20 NOTE — ED Provider Notes (Signed)
[facility] EMERGENCY DEPARTMENT Provider Note   CSN: [id number] Arrival date & time: 10/20/20  1404     History No chief complaint on file.   Nicholas [name] is a 75 y.o. male with history of Parkinson's disease, chronic lymphedema and chronic leg wounds followed by wound clinic septum to the emergency department today for trauma to his left leg.  Patient brought in by EMS from nursing home, was using his motorized wheelchair, accidentally pressed the wrong button and caught his leg on the wall, has 3 lacerations to his left lower leg and one laceration to the toe.  Is not any blood thinners.  Patient states that he is had chronic lymphedema for over 10 years.  Nothing is changed in regards to his lymphedema.  Denies any numbness or tingling or weakness into his leg.  States that he was in his normal health yesterday.  No other injuries.  HPI     Past Medical History:  Diagnosis Date  . Cancer [facility]) Jan 2008   skin; hx of melanoma left foot/ amputation of toes 1&2   . Hyperlipidemia   . Hypertension   . Lymphedema     Patient Active Problem List   Diagnosis Date Noted  . Cellulitis 04/12/2020  . Strain of right biceps 09/12/2017  . Lymphedema 02/10/2017  . BPH associated with nocturia 05/16/2016  . Senile purpura ([place]) 05/03/2016  . OSA (obstructive sleep apnea) 12/28/2015  . Hypersomnia 11/15/2015  . Cough 11/15/2015  . 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 Dizziness 12/30/2014  . Diastolic dysfunction 74/25/9563  . Former smoker 04/29/2014  . Chronic venous insufficiency 02/20/2014  . Hyperglycemia 08/02/2012  . Morbid obesity ([place]) 07/13/2010  . History of malignant melanoma. left leg 03/09/2009  . Insomnia 10/03/2007  . Hyperlipidemia 12/13/2006  . Essential hypertension 12/13/2006    Past Surgical History:  Procedure Laterality Date  . 4th toe- 2nd primary melanoma  2009  . removal of melanoma of left foot/amputation of toes 1&25 Jul 2006  . WRIST FRACTURE SURGERY     75 years old-set       Family History  Problem Relation Age of Onset  . Hypertension Father   . CVA Father        age 43  . Hyperlipidemia Father   . Other Mother        Deceased  . Healthy Sister   . Healthy Brother     Social History   Tobacco Use  . Smoking status: Former Smoker    Packs/day: 1.00    Years: 16.00    Pack years: 16.00    Types: Cigarettes    Quit date: 12/22/1993    Years since quitting: 26.8  . Smokeless tobacco: Never Used  Vaping Use  . Vaping Use: Never used  Substance Use Topics  . Alcohol use: No    Alcohol/week: 21.0 standard drinks    Types: 21 Standard drinks or equivalent per week  . Drug use: No    Home Medications Prior to Admission medications   Medication Sig Start Date End Date Taking? Authorizing Provider  cephALEXin (KEFLEX) 500 MG capsule Take 1 capsule (500 mg total) by mouth 2 (two) times daily. 10/20/20  Yes Alfredia Client, PA-C  albuterol (VENTOLIN HFA) 108 (90 Base) MCG/ACT inhaler Inhale 2 puffs into the lungs every 6 (six) hours as needed for wheezing or shortness of breath. 09/06/20   [name], MD  carbidopa-levodopa (SINEMET IR) 25-100  MG tablet TAKE 2 TABLETS AT 7AM,11AM, 3PM, AND AT 7PM. 07/13/20   [name], Rebecca S, DO  rosuvastatin (CRESTOR) 10 MG tablet TAKE 1 TABLET ONCE DAILY. 09/06/20   [name], MD    Allergies    Amoxicillin  Review of Systems   Review of Systems  Constitutional: Negative for chills, diaphoresis, fatigue and fever.  HENT: Negative for congestion, sore throat and trouble swallowing.   Eyes: Negative for pain and visual disturbance.  Respiratory: Negative for cough, shortness of breath and wheezing.   Cardiovascular: Negative for chest pain, palpitations and leg swelling.  Gastrointestinal: Negative for abdominal distention, abdominal pain, diarrhea, nausea and vomiting.  Genitourinary: Negative for difficulty urinating.  Musculoskeletal:  Negative for back pain, neck pain and neck stiffness.  Skin: Positive for wound. Negative for pallor.  Neurological: Negative for dizziness, speech difficulty, weakness and headaches.  Psychiatric/Behavioral: Negative for confusion.    Physical Exam Updated Vital Signs BP 123/66 (BP Location: Right Arm)   Pulse 81   Temp 97.6 F (36.4 C) (Oral)   Resp 15   SpO2 100%   Physical Exam Constitutional:      General: He is not in acute distress.    Appearance: Normal appearance. He is not ill-appearing, toxic-appearing or diaphoretic.  HENT:     Mouth/Throat:     Mouth: Mucous membranes are moist.     Pharynx: Oropharynx is clear.  Eyes:     General: No scleral icterus.    Extraocular Movements: Extraocular movements intact.     Pupils: Pupils are equal, round, and reactive to light.  Cardiovascular:     Rate and Rhythm: Normal rate and regular rhythm.     Pulses: Normal pulses.     Heart sounds: Normal heart sounds.  Pulmonary:     Effort: Pulmonary effort is normal. No respiratory distress.     Breath sounds: Normal breath sounds. No stridor. No wheezing, rhonchi or rales.  Chest:     Chest wall: No tenderness.  Abdominal:     General: Abdomen is flat. There is no distension.     Palpations: Abdomen is soft.     Tenderness: There is no abdominal tenderness. There is no guarding or rebound.  Musculoskeletal:        General: No swelling or tenderness. Normal range of motion.     Cervical back: Normal range of motion and neck supple. No rigidity.     Right lower leg: Edema present.     Left lower leg: Edema present.  Skin:    General: Skin is warm and dry.     Capillary Refill: Capillary refill takes less than 2 seconds.     Coloration: Skin is not pale.     Comments: Right lower extremity:  Two 10 cm lacerations to posterior mid calf.  One 8 cm laceration to posterior ankle.  See pictures.  One 5 cm laceration to great toe.  Patient is able to move toe in all  directions. Normal sensation. Good color.   Neurological:     General: No focal deficit present.     Mental Status: He is alert and oriented to person, place, and time.  Psychiatric:        Mood and Affect: Mood normal.        Behavior: Behavior normal.          ED Results / Procedures / Treatments   Labs (all labs ordered are listed, but only abnormal results are displayed) Labs Reviewed  CBC    EKG None  Radiology DG Tibia/Fibula Right  Result Date: 10/20/2020 CLINICAL DATA:  Pain following trauma EXAM: RIGHT TIBIA AND FIBULA - 2 VIEW COMPARISON:  April 15, 2020 FINDINGS: Frontal and lateral views obtained. There is generalized soft tissue swelling. No fracture or dislocation. No abnormal periosteal reaction. Joint spaces appear unremarkable. No radiopaque foreign body. IMPRESSION: Soft tissue swelling. No fracture or dislocation. No appreciable arthropathy. No radiopaque foreign body. Electronically Signed   By: Lowella Grip III M.D.   On: 10/20/2020 15:09   DG Foot Complete Right  Result Date: 10/20/2020 CLINICAL DATA:  Pain following trauma EXAM: RIGHT FOOT COMPLETE - 3+ VIEW COMPARISON:  None FINDINGS: Frontal, oblique, and lateral views were obtained. There is marked soft tissue swelling dorsally. No fracture or dislocation. Flexion of multiple distal joints. No appreciable joint space narrowing or erosion. No radiopaque foreign body. IMPRESSION: Marked soft tissue swelling dorsally. No fracture or dislocation. No appreciable joint space narrowing or erosion. Electronically Signed   By: Lowella Grip III M.D.   On: 10/20/2020 15:10    Procedures .Marland KitchenLaceration Repair  Date/Time: 10/20/2020 6:19 PM Performed by: Alfredia Client, PA-C Authorized by: Alfredia Client, PA-C   Consent:    Consent obtained:  Verbal   Consent given by:  Patient   Risks discussed:  Infection, need for additional repair, pain, poor cosmetic result and poor wound healing   Alternatives  discussed:  No treatment and delayed treatment Universal protocol:    Procedure explained and questions answered to patient or proxy's satisfaction: yes     Relevant documents present and verified: yes     Test results available: yes     Imaging studies available: yes     Required blood products, implants, devices, and special equipment available: yes     Site/side marked: yes     Immediately prior to procedure, a time out was called: yes     Patient identity confirmed:  Verbally with patient Anesthesia:    Anesthesia method:  Local infiltration   Local anesthetic:  Lidocaine 1% w/o epi Laceration details:    Location:  Leg   Leg location:  R lower leg   Length (cm):  10   Depth (mm):  3 Exploration:    Hemostasis achieved with:  Direct pressure   Imaging obtained: x-ray     Imaging outcome: foreign body not noted     Wound exploration: wound explored through full range of motion     Contaminated: no   Treatment:    Area cleansed with:  Povidone-iodine and saline Skin repair:    Repair method:  Sutures   Suture size:  4-0   Suture material:  Prolene   Suture technique:  Simple interrupted   Number of sutures:  9 Approximation:    Approximation:  Close Repair type:    Repair type:  Simple Post-procedure details:    Dressing:  Sterile dressing   Procedure completion:  Tolerated well, no immediate complications .Marland KitchenLaceration Repair  Date/Time: 10/20/2020 6:21 PM Performed by: Alfredia Client, PA-C Authorized by: Alfredia Client, PA-C   Consent:    Consent obtained:  Verbal   Consent given by:  Patient   Risks discussed:  Infection, need for additional repair, pain, poor cosmetic result and poor wound healing   Alternatives discussed:  No treatment and delayed treatment Universal protocol:    Procedure explained and questions answered to patient or proxy's satisfaction: yes     Relevant documents present and verified:  yes     Test results available: yes     Imaging studies  available: yes     Required blood products, implants, devices, and special equipment available: yes     Site/side marked: yes     Immediately prior to procedure, a time out was called: yes     Patient identity confirmed:  Verbally with patient Anesthesia:    Anesthesia method:  Local infiltration   Local anesthetic:  Lidocaine 1% w/o epi Laceration details:    Location:  Leg   Leg location:  R lower leg   Length (cm):  10   Depth (mm):  3 Exploration:    Hemostasis achieved with:  Direct pressure   Imaging obtained: x-ray     Imaging outcome: foreign body not noted   Treatment:    Area cleansed with:  Povidone-iodine and saline Skin repair:    Repair method:  Sutures   Suture size:  4-0   Suture material:  Prolene   Suture technique:  Simple interrupted   Number of sutures:  6 Repair type:    Repair type:  Simple Post-procedure details:    Dressing:  Non-adherent dressing .Marland KitchenLaceration Repair  Date/Time: 10/20/2020 6:22 PM Performed by: Alfredia Client, PA-C Authorized by: Alfredia Client, PA-C   Consent:    Consent obtained:  Verbal   Consent given by:  Patient   Risks discussed:  Infection, need for additional repair, pain, poor cosmetic result and poor wound healing   Alternatives discussed:  No treatment and delayed treatment Universal protocol:    Procedure explained and questions answered to patient or proxy's satisfaction: yes     Relevant documents present and verified: yes     Test results available: yes     Imaging studies available: yes     Required blood products, implants, devices, and special equipment available: yes     Site/side marked: yes     Immediately prior to procedure, a time out was called: yes     Patient identity confirmed:  Verbally with patient Anesthesia:    Anesthesia method:  Local infiltration   Local anesthetic:  Lidocaine 1% w/o epi Laceration details:    Location:  Foot   Foot location:  R ankle   Length (cm):  6   Depth (mm):   4 Pre-procedure details:    Preparation:  Patient was prepped and draped in usual sterile fashion Exploration:    Imaging obtained: x-ray     Imaging outcome: foreign body not noted   Treatment:    Area cleansed with:  Povidone-iodine and saline Skin repair:    Repair method:  Sutures   Suture size:  4-0   Suture material:  Prolene   Suture technique:  Running   Number of sutures:  1 Approximation:    Approximation:  Close Repair type:    Repair type:  Simple Post-procedure details:    Dressing:  Non-adherent dressing .Marland KitchenLaceration Repair  Date/Time: 10/20/2020 6:23 PM Performed by: Alfredia Client, PA-C Authorized by: Alfredia Client, PA-C   Consent:    Consent obtained:  Verbal   Consent given by:  Patient   Risks discussed:  Infection, need for additional repair, pain, poor cosmetic result and poor wound healing   Alternatives discussed:  No treatment and delayed treatment Universal protocol:    Procedure explained and questions answered to patient or proxy's satisfaction: yes     Relevant documents present and verified: yes     Test results available: yes  Imaging studies available: yes     Required blood products, implants, devices, and special equipment available: yes     Site/side marked: yes     Immediately prior to procedure, a time out was called: yes     Patient identity confirmed:  Verbally with patient Anesthesia:    Anesthesia method:  Local infiltration   Local anesthetic:  Lidocaine 1% w/o epi Laceration details:    Location:  Toe   Toe location:  R big toe   Length (cm):  5   Depth (mm):  4 Treatment:    Area cleansed with:  Povidone-iodine and saline Skin repair:    Repair method:  Sutures   Suture size:  4-0   Suture technique:  Simple interrupted   Number of sutures:  6 Post-procedure details:    Dressing:  Non-adherent dressing     Medications Ordered in ED Medications  Tdap (BOOSTRIX) injection 0.5 mL (0.5 mLs Intramuscular Given  10/20/20 1628)  lidocaine (PF) (XYLOCAINE) 1 % injection 30 mL (30 mLs Infiltration Given 10/20/20 1628)    ED Course  I have reviewed the triage vital signs and the nursing notes.  Pertinent labs & imaging results that were available during my care of the patient were reviewed by me and considered in my medical decision making (see chart for details).    MDM Rules/Calculators/A&P                          Nicholas [name] is a 75 y.o. male with history of Parkinson's disease, chronic lymphedema and chronic leg wounds followed by wound clinic septum to the emergency department today for trauma to his left leg.  4 lacerations total, plain films negative.  Lacerations complicated by the amount of lymphedema, was able to suture them to the best my ability.  Patient is distally vastly intact to baseline.   Suture notes in, will put  on prophylactic antibiotics.  Discussed symptomatic treatment to patient including wound care and dressing changes.  Patient and wife agreeable.  Hemoglobin stable.  Patient be discharged.  Doubt need for further emergent work up at this time. I explained the diagnosis and have given explicit precautions to return to the ER including for any other new or worsening symptoms. The patient understands and accepts the medical plan as it's been dictated and I have answered their questions. Discharge instructions concerning home care and prescriptions have been given. The patient is STABLE and is discharged to home in good condition.  I discussed this case with my attending physician who cosigned this note including patient's presenting symptoms, physical exam, and planned diagnostics and interventions. Attending physician stated agreement with plan or made changes to plan which were implemented.   Attending physician assessed patient at bedside.  Final Clinical Impression(s) / ED Diagnoses Final diagnoses:  Laceration of multiple sites of right lower extremity, initial  encounter    Rx / DC Orders ED Discharge Orders         Ordered    cephALEXin (KEFLEX) 500 MG capsule  2 times daily        10/20/20 1825           Alfredia Client, PA-C 10/20/20 1854    Margette Fast, MD 10/20/20 [phone]

## 2020-10-20 NOTE — ED Triage Notes (Signed)
Pt BIB [facility] from [facility] c/o of right lower leg lac. Pt uses a motorized wheelchair to get around and was headed into the rehab room and caught his leg on the wall. Pt has 4 lacs to the lower leg. Combat gauze applied.

## 2020-10-20 NOTE — Discharge Instructions (Addendum)
You are seen today for multiple lacerations.  Please use the attached instructions.  I want you to have your wound dressings changed twice a day, as instructed.  Continue to go to your wound care nurse, schedule appointment with them as soon as you can at the nursing home.  I want you to take the prophylactic antibiotics.  Please have your sutures removed in the next 7 days, you can either go to your primary care about this or come back here.  I want you to have your wound checked in the next 2 days.  If you have any new or worsening concerning symptoms please come back to the emergency department.

## 2020-10-20 NOTE — ED Notes (Signed)
Wound care preformed on patient at this time. Clean abd pads, gauze, and kerlex placed. Dressing clean, dry, and intact. Patient given discharge paperwork and instructions. Verbalized understanding of teaching. No IV access. Wheeled to exit in NAD.

## 2020-10-22 ENCOUNTER — Telehealth: Payer: Self-pay

## 2020-10-22 ENCOUNTER — Other Ambulatory Visit: Payer: Self-pay | Admitting: Family Medicine

## 2020-10-22 NOTE — Telephone Encounter (Signed)
Nicholas [name] from [facility] called reporting pt fell on Wednesday out of his wheelchair. Pt had multiple severe lacerations and was taken to ED. ED sutured lacerations but pt is needing dressing changes. Nurse is requesting order for dressing changes 2 x weekly. Will fax over order for approval. Please advise.

## 2020-10-22 NOTE — Telephone Encounter (Signed)
Yes thanks-you may submit orders

## 2020-10-22 NOTE — Telephone Encounter (Signed)
Returned [place] call and provided VO.

## 2020-10-22 NOTE — Telephone Encounter (Signed)
FYI

## 2020-10-26 DIAGNOSIS — E785 Hyperlipidemia, unspecified: Secondary | ICD-10-CM | POA: Diagnosis not present

## 2020-10-26 DIAGNOSIS — G4733 Obstructive sleep apnea (adult) (pediatric): Secondary | ICD-10-CM | POA: Diagnosis not present

## 2020-10-26 DIAGNOSIS — Z6841 Body Mass Index (BMI) 40.0 and over, adult: Secondary | ICD-10-CM | POA: Diagnosis not present

## 2020-10-26 DIAGNOSIS — G4710 Hypersomnia, unspecified: Secondary | ICD-10-CM | POA: Diagnosis not present

## 2020-10-26 DIAGNOSIS — Z87891 Personal history of nicotine dependence: Secondary | ICD-10-CM | POA: Diagnosis not present

## 2020-10-26 DIAGNOSIS — R739 Hyperglycemia, unspecified: Secondary | ICD-10-CM | POA: Diagnosis not present

## 2020-10-26 DIAGNOSIS — Z791 Long term (current) use of non-steroidal anti-inflammatories (NSAID): Secondary | ICD-10-CM | POA: Diagnosis not present

## 2020-10-26 DIAGNOSIS — G20 Parkinson's disease: Secondary | ICD-10-CM | POA: Diagnosis not present

## 2020-10-26 DIAGNOSIS — I890 Lymphedema, not elsewhere classified: Secondary | ICD-10-CM | POA: Diagnosis not present

## 2020-10-26 DIAGNOSIS — R7303 Prediabetes: Secondary | ICD-10-CM | POA: Diagnosis not present

## 2020-10-26 DIAGNOSIS — D692 Other nonthrombocytopenic purpura: Secondary | ICD-10-CM | POA: Diagnosis not present

## 2020-10-26 DIAGNOSIS — I872 Venous insufficiency (chronic) (peripheral): Secondary | ICD-10-CM | POA: Diagnosis not present

## 2020-10-26 DIAGNOSIS — N400 Enlarged prostate without lower urinary tract symptoms: Secondary | ICD-10-CM | POA: Diagnosis not present

## 2020-10-26 DIAGNOSIS — I10 Essential (primary) hypertension: Secondary | ICD-10-CM | POA: Diagnosis not present

## 2020-10-29 ENCOUNTER — Ambulatory Visit (INDEPENDENT_AMBULATORY_CARE_PROVIDER_SITE_OTHER): Payer: PPO | Admitting: Family Medicine

## 2020-10-29 ENCOUNTER — Encounter: Payer: Self-pay | Admitting: Family Medicine

## 2020-10-29 VITALS — BP 133/82 | HR 83 | Ht 66.0 in

## 2020-10-29 DIAGNOSIS — I890 Lymphedema, not elsewhere classified: Secondary | ICD-10-CM | POA: Diagnosis not present

## 2020-10-29 DIAGNOSIS — S81802A Unspecified open wound, left lower leg, initial encounter: Secondary | ICD-10-CM

## 2020-10-29 NOTE — Progress Notes (Signed)
   Nicholas [name] is a 75 y.o. male who presents today for an office visit.  Assessment/Plan:  Laceration Poor wound healing likely secondary to significant lymphedema.  No signs of infection or erythema today.  We successfully removed his sutures today after some initial difficulty-see below procedure note.  Recommended referral to wound care clinic however he declined.  He would like to continue wound care with his caregiver at his nursing home.  His wounds will need to heal via secondary intention over the next few weeks.  Discussed warning signs for infection and reasons to return to care.    Subjective:  HPI:  Patient here today for ED follow-up.  Was seen in the ED on 10/20/2020 with multiple lacerations.  He was in his nursing home when he caught his leg on the wall wall in his motorized wheelchair.  Had several lacerations to left lower leg and left toe.  In the ED he had 4 lacerations repaired. A total of 21 simple interrupted sutures and one running suture was placed.       Objective:  Physical Exam: BP 133/82   Pulse 83   Ht 5\' 6"  (1.676 m)   BMI 43.58 kg/m   Gen: No acute distress, resting comfortably Skin: Significant lymphedema in bilateral lower extremities.  Lacerations on posterior right lower extremity without approximated edges.  Granulation tissue present.  No signs of surrounding erythema.  Weeping noted. Neuro: Grossly normal, moves all extremities Psych: Normal affect and thought content  Patient's lacerations were difficult to be visualized due to his lymphedema and poor mobility.  He was not able to stand on his own and was not able to transfer to our standard exam room bed.  He was transferred into procedure room with mechanical bed. Myself and our RMA helped patient transfer. We were then able to visualize his lacerations after positioning on the mechanical bed. Discussed with patient that his wounds were not yet well approximated and that they will need to  finish healing via secondary intention.    Patient's sutures were removed without complication.  Time Spent: 60 minutes of total time was spent on the date of the encounter performing the following actions: chart review prior to seeing the patient including his recent ED visit, obtaining history, performing a medically necessary exam, removing sutures.  Counseling on the treatment plan, placing orders, and documenting in our EHR.        Algis Greenhouse. Jerline Pain, MD 10/29/2020 4:36 PM

## 2020-10-29 NOTE — Patient Instructions (Signed)
It was very nice to see you today!  We removed your stitches today.  We will take several weeks for this to fully heal.  Please let us know if you notice any signs of infection.  Take care, Dr Jerline Pain  PLEASE NOTE:  If you had any lab tests please let us know if you have not heard back within a few days. You may see your results on mychart before we have a chance to review them but we will give you a call once they are reviewed by [place]. If we ordered any referrals today, please let us know if you have not heard from their office within the next week.   Please try these tips to maintain a healthy lifestyle:   Eat at least 3 REAL meals and 1-2 snacks per day.  Aim for no more than 5 hours between eating.  If you eat breakfast, please do so within one hour of getting up.    Each meal should contain half fruits/vegetables, one quarter protein, and one quarter carbs (no bigger than a computer mouse)   Cut down on sweet beverages. This includes juice, soda, and sweet tea.     Drink at least 1 glass of water with each meal and aim for at least 8 glasses per day   Exercise at least 150 minutes every week.

## 2020-11-02 DIAGNOSIS — G4733 Obstructive sleep apnea (adult) (pediatric): Secondary | ICD-10-CM | POA: Diagnosis not present

## 2020-11-02 DIAGNOSIS — Z791 Long term (current) use of non-steroidal anti-inflammatories (NSAID): Secondary | ICD-10-CM | POA: Diagnosis not present

## 2020-11-02 DIAGNOSIS — R739 Hyperglycemia, unspecified: Secondary | ICD-10-CM | POA: Diagnosis not present

## 2020-11-02 DIAGNOSIS — D692 Other nonthrombocytopenic purpura: Secondary | ICD-10-CM | POA: Diagnosis not present

## 2020-11-02 DIAGNOSIS — G20 Parkinson's disease: Secondary | ICD-10-CM | POA: Diagnosis not present

## 2020-11-02 DIAGNOSIS — I10 Essential (primary) hypertension: Secondary | ICD-10-CM | POA: Diagnosis not present

## 2020-11-02 DIAGNOSIS — I890 Lymphedema, not elsewhere classified: Secondary | ICD-10-CM | POA: Diagnosis not present

## 2020-11-02 DIAGNOSIS — G4710 Hypersomnia, unspecified: Secondary | ICD-10-CM | POA: Diagnosis not present

## 2020-11-02 DIAGNOSIS — R7303 Prediabetes: Secondary | ICD-10-CM | POA: Diagnosis not present

## 2020-11-02 DIAGNOSIS — I872 Venous insufficiency (chronic) (peripheral): Secondary | ICD-10-CM | POA: Diagnosis not present

## 2020-11-02 DIAGNOSIS — Z87891 Personal history of nicotine dependence: Secondary | ICD-10-CM | POA: Diagnosis not present

## 2020-11-02 DIAGNOSIS — E785 Hyperlipidemia, unspecified: Secondary | ICD-10-CM | POA: Diagnosis not present

## 2020-11-02 DIAGNOSIS — N400 Enlarged prostate without lower urinary tract symptoms: Secondary | ICD-10-CM | POA: Diagnosis not present

## 2020-11-02 DIAGNOSIS — Z6841 Body Mass Index (BMI) 40.0 and over, adult: Secondary | ICD-10-CM | POA: Diagnosis not present

## 2020-11-04 ENCOUNTER — Emergency Department (HOSPITAL_COMMUNITY): Payer: PPO

## 2020-11-04 ENCOUNTER — Inpatient Hospital Stay (HOSPITAL_COMMUNITY)
Admission: EM | Admit: 2020-11-04 | Discharge: 2020-11-08 | DRG: 872 | Disposition: A | Discharge status: Home Health | Payer: PPO | Attending: Internal Medicine | Admitting: Internal Medicine

## 2020-11-04 ENCOUNTER — Encounter (HOSPITAL_COMMUNITY): Payer: Self-pay

## 2020-11-04 ENCOUNTER — Other Ambulatory Visit: Payer: Self-pay

## 2020-11-04 ENCOUNTER — Telehealth: Payer: Self-pay

## 2020-11-04 ENCOUNTER — Inpatient Hospital Stay (HOSPITAL_COMMUNITY): Payer: PPO

## 2020-11-04 DIAGNOSIS — Z20822 Contact with and (suspected) exposure to covid-19: Secondary | ICD-10-CM | POA: Diagnosis not present

## 2020-11-04 DIAGNOSIS — L0291 Cutaneous abscess, unspecified: Secondary | ICD-10-CM

## 2020-11-04 DIAGNOSIS — Z6841 Body Mass Index (BMI) 40.0 and over, adult: Secondary | ICD-10-CM

## 2020-11-04 DIAGNOSIS — S46211A Strain of muscle, fascia and tendon of other parts of biceps, right arm, initial encounter: Secondary | ICD-10-CM | POA: Diagnosis not present

## 2020-11-04 DIAGNOSIS — E785 Hyperlipidemia, unspecified: Secondary | ICD-10-CM | POA: Diagnosis not present

## 2020-11-04 DIAGNOSIS — R509 Fever, unspecified: Secondary | ICD-10-CM | POA: Diagnosis not present

## 2020-11-04 DIAGNOSIS — R6520 Severe sepsis without septic shock: Secondary | ICD-10-CM | POA: Diagnosis not present

## 2020-11-04 DIAGNOSIS — A419 Sepsis, unspecified organism: Principal | ICD-10-CM

## 2020-11-04 DIAGNOSIS — Z8639 Personal history of other endocrine, nutritional and metabolic disease: Secondary | ICD-10-CM

## 2020-11-04 DIAGNOSIS — Z79899 Other long term (current) drug therapy: Secondary | ICD-10-CM | POA: Diagnosis not present

## 2020-11-04 DIAGNOSIS — L0390 Cellulitis, unspecified: Secondary | ICD-10-CM | POA: Diagnosis not present

## 2020-11-04 DIAGNOSIS — I10 Essential (primary) hypertension: Secondary | ICD-10-CM | POA: Diagnosis present

## 2020-11-04 DIAGNOSIS — Z87891 Personal history of nicotine dependence: Secondary | ICD-10-CM | POA: Diagnosis not present

## 2020-11-04 DIAGNOSIS — Z823 Family history of stroke: Secondary | ICD-10-CM | POA: Diagnosis not present

## 2020-11-04 DIAGNOSIS — Z743 Need for continuous supervision: Secondary | ICD-10-CM | POA: Diagnosis not present

## 2020-11-04 DIAGNOSIS — Z880 Allergy status to penicillin: Secondary | ICD-10-CM

## 2020-11-04 DIAGNOSIS — I890 Lymphedema, not elsewhere classified: Secondary | ICD-10-CM | POA: Diagnosis not present

## 2020-11-04 DIAGNOSIS — Z85820 Personal history of malignant melanoma of skin: Secondary | ICD-10-CM

## 2020-11-04 DIAGNOSIS — Z872 Personal history of diseases of the skin and subcutaneous tissue: Secondary | ICD-10-CM | POA: Diagnosis not present

## 2020-11-04 DIAGNOSIS — L03115 Cellulitis of right lower limb: Secondary | ICD-10-CM | POA: Diagnosis present

## 2020-11-04 DIAGNOSIS — M79661 Pain in right lower leg: Secondary | ICD-10-CM | POA: Diagnosis not present

## 2020-11-04 DIAGNOSIS — R062 Wheezing: Secondary | ICD-10-CM | POA: Diagnosis not present

## 2020-11-04 DIAGNOSIS — Z8249 Family history of ischemic heart disease and other diseases of the circulatory system: Secondary | ICD-10-CM

## 2020-11-04 DIAGNOSIS — G20 Parkinson's disease: Secondary | ICD-10-CM | POA: Diagnosis not present

## 2020-11-04 DIAGNOSIS — R279 Unspecified lack of coordination: Secondary | ICD-10-CM | POA: Diagnosis not present

## 2020-11-04 DIAGNOSIS — E669 Obesity, unspecified: Secondary | ICD-10-CM | POA: Diagnosis present

## 2020-11-04 DIAGNOSIS — R0602 Shortness of breath: Secondary | ICD-10-CM | POA: Diagnosis not present

## 2020-11-04 DIAGNOSIS — R42 Dizziness and giddiness: Secondary | ICD-10-CM | POA: Diagnosis not present

## 2020-11-04 DIAGNOSIS — Z66 Do not resuscitate: Secondary | ICD-10-CM | POA: Diagnosis present

## 2020-11-04 DIAGNOSIS — G232 Striatonigral degeneration: Secondary | ICD-10-CM | POA: Diagnosis not present

## 2020-11-04 DIAGNOSIS — R600 Localized edema: Secondary | ICD-10-CM | POA: Diagnosis not present

## 2020-11-04 DIAGNOSIS — R000 Tachycardia, unspecified: Secondary | ICD-10-CM | POA: Diagnosis not present

## 2020-11-04 LAB — RESP PANEL BY RT-PCR (FLU A&B, COVID) ARPGX2
Influenza A by PCR: NEGATIVE
Influenza B by PCR: NEGATIVE
SARS Coronavirus 2 by RT PCR: NEGATIVE

## 2020-11-04 LAB — COMPREHENSIVE METABOLIC PANEL
ALT: 8 U/L (ref 0–44)
AST: 33 U/L (ref 15–41)
Albumin: 3.8 g/dL (ref 3.5–5.0)
Alkaline Phosphatase: 69 U/L (ref 38–126)
Anion gap: 7 (ref 5–15)
BUN: 24 mg/dL — ABNORMAL HIGH (ref 8–23)
CO2: 26 mmol/L (ref 22–32)
Calcium: 8.5 mg/dL — ABNORMAL LOW (ref 8.9–10.3)
Chloride: 103 mmol/L (ref 98–111)
Creatinine, Ser: 0.95 mg/dL (ref 0.61–1.24)
GFR, Estimated: >60 mL/min (ref >60)
Glucose, Bld: 107 mg/dL — ABNORMAL HIGH (ref 70–99)
Potassium: 4.7 mmol/L (ref 3.5–5.1)
Sodium: 136 mmol/L (ref 135–145)
Total Bilirubin: 0.6 mg/dL (ref 0.3–1.2)
Total Protein: 7 g/dL (ref 6.5–8.1)

## 2020-11-04 LAB — CBC WITH DIFFERENTIAL/PLATELET
Abs Immature Granulocytes: 0.17 10*3/uL — ABNORMAL HIGH (ref 0.00–0.07)
Basophils Absolute: 0 10*3/uL (ref 0.0–0.1)
Basophils Relative: 0 %
Eosinophils Absolute: 0 10*3/uL (ref 0.0–0.5)
Eosinophils Relative: 0 %
HCT: 41.2 % (ref 39.0–52.0)
Hemoglobin: 13.2 g/dL (ref 13.0–17.0)
Immature Granulocytes: 1 %
Lymphocytes Relative: 4 %
Lymphs Abs: 0.8 10*3/uL (ref 0.7–4.0)
MCH: 30 pg (ref 26.0–34.0)
MCHC: 32 g/dL (ref 30.0–36.0)
MCV: 93.6 fL (ref 80.0–100.0)
Monocytes Absolute: 1 10*3/uL (ref 0.1–1.0)
Monocytes Relative: 5 %
Neutro Abs: 20.2 10*3/uL — ABNORMAL HIGH (ref 1.7–7.7)
Neutrophils Relative %: 90 %
Platelets: 222 10*3/uL (ref 150–400)
RBC: 4.4 MIL/uL (ref 4.22–5.81)
RDW: 13.5 % (ref 11.5–15.5)
WBC: 22.2 10*3/uL — ABNORMAL HIGH (ref 4.0–10.5)
nRBC: 0 % (ref 0.0–0.2)

## 2020-11-04 LAB — URINALYSIS, ROUTINE W REFLEX MICROSCOPIC
Bilirubin Urine: NEGATIVE
Glucose, UA: NEGATIVE mg/dL
Hgb urine dipstick: NEGATIVE
Ketones, ur: 5 mg/dL — AB
Leukocytes,Ua: NEGATIVE
Nitrite: NEGATIVE
Protein, ur: NEGATIVE mg/dL
Specific Gravity, Urine: 1.025 (ref 1.005–1.030)
pH: 7 (ref 5.0–8.0)

## 2020-11-04 LAB — LACTIC ACID, PLASMA
Lactic Acid, Venous: 2.5 mmol/L (ref 0.5–1.9)
Lactic Acid, Venous: 1.2 mmol/L (ref 0.5–1.9)
Lactic Acid, Venous: 0.9 mmol/L (ref 0.5–1.9)

## 2020-11-04 LAB — PROTIME-INR
INR: 1.2 (ref 0.8–1.2)
Prothrombin Time: 15.3 seconds — ABNORMAL HIGH (ref 11.4–15.2)

## 2020-11-04 LAB — APTT: aPTT: 33 seconds (ref 24–36)

## 2020-11-04 MED ORDER — ACETAMINOPHEN 650 MG RE SUPP: 650.0000 mg | Freq: Four times a day (QID) | RECTAL | Status: DC | PRN

## 2020-11-04 MED ORDER — ROSUVASTATIN CALCIUM 10 MG PO TABS
10.0000 mg | ORAL_TABLET | Freq: Every day | ORAL | Status: DC
  Administered 2020-11-04 – 2020-11-08 (×5): 10 mg via ORAL
  Filled 2020-11-04 (×5): qty 1

## 2020-11-04 MED ORDER — ACETAMINOPHEN 325 MG PO TABS
650.0000 mg | ORAL_TABLET | Freq: Four times a day (QID) | ORAL | Status: DC | PRN
  Administered 2020-11-04 – 2020-11-08 (×9): 650 mg via ORAL
  Filled 2020-11-04 (×9): qty 2

## 2020-11-04 MED ORDER — ACETAMINOPHEN 500 MG PO TABS: 1000.0000 mg | ORAL_TABLET | Freq: Once | ORAL | Status: DC

## 2020-11-04 MED ORDER — LACTATED RINGERS IV BOLUS (SEPSIS)
1000.0000 mL | Freq: Once | INTRAVENOUS | Status: AC
  Administered 2020-11-04: 1000 mL via INTRAVENOUS

## 2020-11-04 MED ORDER — VANCOMYCIN HCL IN DEXTROSE 1-5 GM/200ML-% IV SOLN: 1000.0000 mg | Freq: Once | INTRAVENOUS | Status: DC

## 2020-11-04 MED ORDER — VANCOMYCIN HCL 1750 MG/350ML IV SOLN
1750.0000 mg | INTRAVENOUS | Status: DC
  Administered 2020-11-05 – 2020-11-07 (×3): 1750 mg via INTRAVENOUS
  Filled 2020-11-04 (×4): qty 350

## 2020-11-04 MED ORDER — ENOXAPARIN SODIUM 60 MG/0.6ML ~~LOC~~ SOLN
60.0000 mg | SUBCUTANEOUS | Status: DC
  Administered 2020-11-04 – 2020-11-08 (×5): 60 mg via SUBCUTANEOUS
  Filled 2020-11-04 (×5): qty 0.6

## 2020-11-04 MED ORDER — VANCOMYCIN HCL 2000 MG/400ML IV SOLN
2000.0000 mg | Freq: Once | INTRAVENOUS | Status: AC
  Administered 2020-11-04: 2000 mg via INTRAVENOUS
  Filled 2020-11-04: qty 400

## 2020-11-04 MED ORDER — ALBUTEROL SULFATE HFA 108 (90 BASE) MCG/ACT IN AERS
2.0000 | INHALATION_SPRAY | Freq: Four times a day (QID) | RESPIRATORY_TRACT | Status: DC | PRN
  Filled 2020-11-04: qty 6.7

## 2020-11-04 MED ORDER — OXYCODONE HCL 5 MG PO TABS: 5.0000 mg | ORAL_TABLET | ORAL | Status: DC | PRN

## 2020-11-04 MED ORDER — SODIUM CHLORIDE 0.9 % IV SOLN
2.0000 g | Freq: Once | INTRAVENOUS | Status: AC
  Administered 2020-11-04: 2 g via INTRAVENOUS
  Filled 2020-11-04: qty 20

## 2020-11-04 MED ORDER — CARBIDOPA-LEVODOPA 25-100 MG PO TABS
2.0000 | ORAL_TABLET | Freq: Four times a day (QID) | ORAL | Status: DC
  Administered 2020-11-04 – 2020-11-08 (×17): 2 via ORAL
  Filled 2020-11-04 (×17): qty 2

## 2020-11-04 MED ORDER — ONDANSETRON HCL 4 MG/2ML IJ SOLN: 4.0000 mg | Freq: Four times a day (QID) | INTRAMUSCULAR | Status: DC | PRN

## 2020-11-04 MED ORDER — SODIUM CHLORIDE 0.9 % IV BOLUS (SEPSIS)
1000.0000 mL | Freq: Once | INTRAVENOUS | Status: AC
  Administered 2020-11-04: 1000 mL via INTRAVENOUS

## 2020-11-04 MED ORDER — SODIUM CHLORIDE 0.9 % IV SOLN
2.0000 g | INTRAVENOUS | Status: DC
  Administered 2020-11-05 – 2020-11-07 (×3): 2 g via INTRAVENOUS
  Filled 2020-11-04: qty 20
  Filled 2020-11-04: qty 2
  Filled 2020-11-04: qty 20
  Filled 2020-11-04: qty 2

## 2020-11-04 MED ORDER — ONDANSETRON HCL 4 MG PO TABS: 4.0000 mg | ORAL_TABLET | Freq: Four times a day (QID) | ORAL | Status: DC | PRN

## 2020-11-04 NOTE — H&P (Signed)
History and Physical    Nicholas [name] FBP:[id number] DOB: 11/10/1945 DOA: 11/04/2020  PCP: [name], MD  Patient coming from: Home  Chief Complaint: fever  HPI: Nicholas [name] is a 75 y.o. male with medical history significant of Parkinson's disease, HLD, HTN. Presenting with fevers. History from wife. She reports that the patient was in his normal state of health until this morning. She noticed that he had a fever. It was also noted that his right leg lymphedema seemed worse today. He complained of nausea as well as a general malaise. She became concerned and called EMS to bring him to the ED. They deny any other aggravating or alleviating factors.    ED Course: He was found to be septic. He was started on vanc/rocephin w/ the source presumed to be his RLE. TRH was called for admission.   Review of Systems:  Denies CP, dyspnea, palpitations, V/D, sick contacts. Review of systems is otherwise negative for all not mentioned in HPI.   PMHx Past Medical History:  Diagnosis Date  . 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 Hyperlipidemia   . Hypertension   . Lymphedema     PSHx Past Surgical History:  Procedure Laterality Date  . 4th toe- 2nd primary melanoma  2009  . removal of melanoma of left foot/amputation of toes 1&25 Jul 2006  . WRIST FRACTURE SURGERY     75 years old-set    SocHx  reports that he quit smoking about 26 years ago. His smoking use included cigarettes. He has a 16.00 pack-year smoking history. He has never used smokeless tobacco. He reports that he does not drink alcohol and does not use drugs.  Allergies  Allergen Reactions  . Amoxicillin Swelling    Presumed angioedema of lips due to prednisone    FamHx Family History  Problem Relation Age of Onset  . Hypertension Father   . CVA Father        age 35  . Hyperlipidemia Father   . Other Mother        Deceased  . Healthy Sister   . Healthy Brother      Prior to Admission medications   Medication Sig Start Date End Date Taking? Authorizing Provider  albuterol (VENTOLIN HFA) 108 (90 Base) MCG/ACT inhaler Inhale 2 puffs into the lungs every 6 (six) hours as needed for wheezing or shortness of breath. 10/25/20   [name], MD  carbidopa-levodopa (SINEMET IR) 25-100 MG tablet TAKE 2 TABLETS AT 7AM,11AM, 3PM, AND AT 7PM. 07/13/20   [name], [name], DO  cephALEXin (KEFLEX) 500 MG capsule Take 1 capsule (500 mg total) by mouth 2 (two) times daily. 10/20/20   Alfredia Client, PA-C  rosuvastatin (CRESTOR) 10 MG tablet TAKE 1 TABLET ONCE DAILY. 09/06/20   [name], MD    Physical Exam: Vitals:   11/04/20 1502 11/04/20 1530 11/04/20 1531 11/04/20 1546  BP: 136/73 (!) 145/65  136/66  Pulse: (!) 122 (!) 123  (!) 121  Resp: 19 19  16   Temp:   (!) 102.8 F (39.3 C) (!) 102.8 F (39.3 C)  TempSrc:   Rectal Oral  SpO2: 96% 95%  95%  Weight:      Height:        General: 75 y.o. male resting in bed in NAD Eyes: PERRL, normal sclera ENMT: Nares patent w/o discharge, orophaynx clear, dentition normal, ears w/o  discharge/lesions/ulcers Neck: Supple, trachea midline Cardiovascular: tachy, +S1, S2, no m/g/r, equal pulses throughout Respiratory: CTABL, no w/r/r, normal WOB GI: BS+, NDNT, no masses noted, no organomegaly noted MSK: No c/c; BLE edema R > L; healing lacerations on R calf non purulent, looks to be healing of secondary intent; small scab noted at base of right lateral toes Skin: No rashes, bruises, ulcerations noted Neuro: A&O x 3, no focal deficits Psyc: Appropriate interaction and affect, calm/cooperative  Labs on Admission: I have personally reviewed following labs and imaging studies  CBC: Recent Labs  Lab 11/04/20 1337  WBC 22.2*  NEUTROABS 20.2*  HGB 13.2  HCT 41.2  MCV 93.6  PLT 270   Basic Metabolic Panel: Recent Labs  Lab 11/04/20 1337  NA 136  K 4.7  CL 103  CO2 26  GLUCOSE 107*  BUN 24*   CREATININE 0.95  CALCIUM 8.5*   GFR: Estimated Creatinine Clearance: 84.2 mL/min (by C-G formula based on SCr of 0.95 mg/dL). Liver Function Tests: Recent Labs  Lab 11/04/20 1337  AST 33  ALT 8  ALKPHOS 69  BILITOT 0.6  PROT 7.0  ALBUMIN 3.8   No results for input(s): LIPASE, AMYLASE in the last 168 hours. No results for input(s): AMMONIA in the last 168 hours. Coagulation Profile: No results for input(s): INR, PROTIME in the last 168 hours. Cardiac Enzymes: No results for input(s): CKTOTAL, CKMB, CKMBINDEX, TROPONINI in the last 168 hours. BNP (last 3 results) No results for input(s): PROBNP in the last 8760 hours. HbA1C: No results for input(s): HGBA1C in the last 72 hours. CBG: No results for input(s): GLUCAP in the last 168 hours. Lipid Profile: No results for input(s): CHOL, HDL, LDLCALC, TRIG, CHOLHDL, LDLDIRECT in the last 72 hours. Thyroid Function Tests: No results for input(s): TSH, T4TOTAL, FREET4, T3FREE, THYROIDAB in the last 72 hours. Anemia Panel: No results for input(s): VITAMINB12, FOLATE, FERRITIN, TIBC, IRON, RETICCTPCT in the last 72 hours. Urine analysis:    Component Value Date/Time   COLORURINE YELLOW 04/12/2020 1536   APPEARANCEUR CLEAR 04/12/2020 1536   LABSPEC 1.035 (H) 04/12/2020 1536   PHURINE 5.0 04/12/2020 1536   GLUCOSEU NEGATIVE 04/12/2020 1536   HGBUR NEGATIVE 04/12/2020 1536   BILIRUBINUR NEGATIVE 04/12/2020 1536   BILIRUBINUR n 02/09/2017 1214   KETONESUR 5 (A) 04/12/2020 1536   PROTEINUR NEGATIVE 04/12/2020 1536   UROBILINOGEN 0.2 02/09/2017 1214   NITRITE NEGATIVE 04/12/2020 1536   LEUKOCYTESUR NEGATIVE 04/12/2020 1536    Radiological Exams on Admission: DG Chest Port 1 View  Result Date: 11/04/2020 CLINICAL DATA:  Questionable sepsis, short of breath EXAM: PORTABLE CHEST 1 VIEW COMPARISON:  04/12/2020 FINDINGS: The heart size and mediastinal contours are within normal limits. Both lungs are clear. The visualized skeletal  structures are unremarkable. IMPRESSION: No active disease. Electronically Signed   By: Randa Ngo M.D.   On: 11/04/2020 15:05   DG Tibia/Fibula Right Port  Result Date: 11/04/2020 CLINICAL DATA:  Right lower leg pain, cellulitis EXAM: PORTABLE RIGHT TIBIA AND FIBULA - 2 VIEW COMPARISON:  10/20/2020 FINDINGS: Frontal and lateral views of the right tibia and fibula are obtained. There is diffuse lower extremity subcutaneous edema, without significant change since prior study. No subcutaneous gas. There are no acute or destructive bony lesions. Alignment remains anatomic. IMPRESSION: 1. Stable diffuse soft tissue edema. No acute or destructive bony lesion. Electronically Signed   By: Randa Ngo M.D.   On: 11/04/2020 15:06    EKG: Independently reviewed. Sinus  tach, no st elevations  Assessment/Plan Sepsis secondary to RLE cellulitis     - admit to inpt, tele     - continue vanc, rocephin     - fluids, follow lactic acid     - sepsis: fever, tachycardia, elevated WBCs, elevated lactic acid, source: cellulitis     - he has a recent laceration wound on right calf and small scab at base of right lateral toes; the laceration on right calf does not appear purulent, but will check with [place] for abscess  Parkinson's disease     - continue home regimen  HLD     - continue statin  Chronic BLE lymphedema     - LLE is at baseline; it is erythematous, but not warm     - RLE is inflamed and hot; will treat for cellulitis   DVT prophylaxis: lovenox  Code Status: DNR  Family Communication: w/ wife at bedside  Consults called: None   Status is: Inpatient  Remains inpatient appropriate because:Inpatient level of care appropriate due to severity of illness   Dispo: The patient is from: Home              Anticipated d/c is to: Home              Patient currently is not medically stable to d/c.   Difficult to place patient No   Time spent coordinating admission: 75 minutes  [facility]  If 7PM-7AM, please contact night-coverage [email or web address]  11/04/2020, 4:06 PM

## 2020-11-04 NOTE — Telephone Encounter (Signed)
FYI

## 2020-11-04 NOTE — Telephone Encounter (Signed)
Magda Paganini is calling in stating they did a check on patient when arriving they noticed patient looked very hot and was having labored breathing. When taking his temp. It was 101.8, his BP was 172/86, HR: 116. They took a look at his right leg and noticed it was hot to the touch, knows he has a history of cellulitis, called EMS and was awaiting the arrival for them.

## 2020-11-04 NOTE — Progress Notes (Signed)
Pharmacy Antibiotic Note  Nicholas [name] is a 75 y.o. male admitted on 11/04/2020 with sepsis from cellulitis of RLE.  Pharmacy has been consulted for vancomycin and ceftriaxone dosing.  Plan: Ceftriaxone 2g IV q24h Vanc 2g x 1, then 1750mg  q24h (AUC 516, Cmin 11.5, Vd 0.5, SCr 0.95) Follow up renal function & cultures  Height: 5\' 6"  (167.6 cm) Weight: 122.5 kg (270 lb) IBW/kg (Calculated) : 63.8  Temp (24hrs), Avg:102.1 F (38.9 C), Min:101.8 F (38.8 C), Max:102.8 F (39.3 C)  Recent Labs  Lab 11/04/20 1337  WBC 22.2*  CREATININE 0.95  LATICACIDVEN 2.5*    Estimated Creatinine Clearance: 84.2 mL/min (by C-G formula based on SCr of 0.95 mg/dL).    Allergies  Allergen Reactions  . Amoxicillin Swelling    Presumed angioedema of lips due to prednisone    Antimicrobials this admission:  4/14 CTX >> 4/14 Vanc >>  Dose adjustments this admission:   Microbiology results:  4/14 BCx: 4/14 UCx:  Thank you for allowing pharmacy to be a part of this patient's care.  Peggyann Juba, PharmD, BCPS Pharmacy: [phone] 11/04/2020 5:38 PM

## 2020-11-04 NOTE — ED Notes (Signed)
Family at bedside. 

## 2020-11-04 NOTE — Progress Notes (Signed)
A consult was received from an ED physician for vancomycin per pharmacy dosing.  The patient's profile has been reviewed for ht/wt/allergies/indication/available labs.   A one time order has been placed for vancomycin 2g.  Further antibiotics/pharmacy consults should be ordered by admitting physician if indicated.                       Thank you, Peggyann Juba, PharmD, BCPS 11/04/2020  1:52 PM

## 2020-11-04 NOTE — ED Triage Notes (Signed)
Ems brings pt in from home today for bilateral lower leg swelling. States the home health nurse reports pt having a fever today.

## 2020-11-04 NOTE — ED Provider Notes (Signed)
[facility] DEPT Provider Note   CSN: [id number] Arrival date & time: 11/04/20  1305     History Chief Complaint  Patient presents with  . Leg Pain    Nicholas [name] is a 75 y.o. male.  HPI 75 year old male presents with a fever. He developed the fever today and the home health nurse checked on him and thus 911 was called. Has chronic bilateral leg swelling. A few weeks ago he had multiple leg lacerations that were repaired. To him, his legs don't look much different. He has had some wheezing/cough for about 3 weeks.  He was given tylenol just prior to going with EMS.   Past Medical History:  Diagnosis Date  . 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 Hyperlipidemia   . Hypertension   . Lymphedema     Patient Active Problem List   Diagnosis Date Noted  . Cellulitis 04/12/2020  . Strain of right biceps 09/12/2017  . Lymphedema 02/10/2017  . BPH associated with nocturia 05/16/2016  . Senile purpura ([place]) 05/03/2016  . OSA (obstructive sleep apnea) 12/28/2015  . Hypersomnia 11/15/2015  . Cough 11/15/2015  . 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 Dizziness 12/30/2014  . Diastolic dysfunction 64/40/3474  . Former smoker 04/29/2014  . Chronic venous insufficiency 02/20/2014  . Hyperglycemia 08/02/2012  . Morbid obesity ([place]) 07/13/2010  . History of malignant melanoma. left leg 03/09/2009  . Insomnia 10/03/2007  . Hyperlipidemia 12/13/2006  . Essential hypertension 12/13/2006    Past Surgical History:  Procedure Laterality Date  . 4th toe- 2nd primary melanoma  2009  . removal of melanoma of left foot/amputation of toes 1&25 Jul 2006  . WRIST FRACTURE SURGERY     75 years old-set       Family History  Problem Relation Age of Onset  . Hypertension Father   . CVA Father        age 77  . Hyperlipidemia Father   . Other Mother        Deceased  . Healthy Sister   . Healthy Brother      Social History   Tobacco Use  . Smoking status: Former Smoker    Packs/day: 1.00    Years: 16.00    Pack years: 16.00    Types: Cigarettes    Quit date: 12/22/1993    Years since quitting: 26.8  . Smokeless tobacco: Never Used  Vaping Use  . Vaping Use: Never used  Substance Use Topics  . Alcohol use: No    Alcohol/week: 21.0 standard drinks    Types: 21 Standard drinks or equivalent per week  . Drug use: No    Home Medications Prior to Admission medications   Medication Sig Start Date End Date Taking? Authorizing Provider  albuterol (VENTOLIN HFA) 108 (90 Base) MCG/ACT inhaler Inhale 2 puffs into the lungs every 6 (six) hours as needed for wheezing or shortness of breath. 10/25/20   [name], MD  carbidopa-levodopa (SINEMET IR) 25-100 MG tablet TAKE 2 TABLETS AT 7AM,11AM, 3PM, AND AT 7PM. 07/13/20   [name], [name], DO  cephALEXin (KEFLEX) 500 MG capsule Take 1 capsule (500 mg total) by mouth 2 (two) times daily. 10/20/20   Alfredia Client, PA-C  rosuvastatin (CRESTOR) 10 MG tablet TAKE 1 TABLET ONCE DAILY. 09/06/20   [name], MD    Allergies    Amoxicillin  Review  of Systems   Review of Systems  Constitutional: Positive for fever.  Respiratory: Positive for cough and wheezing.   Cardiovascular: Positive for leg swelling.  Gastrointestinal: Negative for abdominal pain.  Skin: Positive for color change and wound.  All other systems reviewed and are negative.   Physical Exam Updated Vital Signs BP (!) 164/90   Pulse (!) 124   Temp (!) 101.8 F (38.8 C) (Rectal)   Resp (!) 22   Ht 5\' 6"  (1.676 m)   Wt 122.5 kg   SpO2 94%   BMI 43.58 kg/m   Physical Exam Vitals and nursing note reviewed.  Constitutional:      Appearance: He is well-developed. He is obese.  HENT:     Head: Normocephalic and atraumatic.     Right Ear: External ear normal.     Left Ear: External ear normal.     Nose: Nose normal.  Eyes:     General:        Right eye: No  discharge.        Left eye: No discharge.  Cardiovascular:     Rate and Rhythm: Regular rhythm. Tachycardia present.     Heart sounds: Normal heart sounds.  Pulmonary:     Effort: Pulmonary effort is normal.     Breath sounds: Wheezing present.  Abdominal:     Palpations: Abdomen is soft.     Tenderness: There is no abdominal tenderness.  Musculoskeletal:     Cervical back: Neck supple.     Comments: Bilateral lower extremity lymphedema.  The right leg seems to be more erythematous and warm compared to the left.  The right calf also has multiple wounds but no purulent drainage.  No significant tenderness throughout foot or leg. Left leg is mildly erythematous  Skin:    General: Skin is warm and dry.  Neurological:     Mental Status: He is alert.  Psychiatric:        Mood and Affect: Mood is not anxious.     ED Results / Procedures / Treatments   Labs (all labs ordered are listed, but only abnormal results are displayed) Labs Reviewed  RESP PANEL BY RT-PCR (FLU A&B, COVID) ARPGX2  CULTURE, BLOOD (ROUTINE X 2)  CULTURE, BLOOD (ROUTINE X 2)  URINE CULTURE  LACTIC ACID, PLASMA  LACTIC ACID, PLASMA  COMPREHENSIVE METABOLIC PANEL  CBC WITH DIFFERENTIAL/PLATELET  PROTIME-INR  APTT  URINALYSIS, ROUTINE W REFLEX MICROSCOPIC    EKG None  Radiology No results found.  Procedures .Critical Care Performed by: Sherwood Gambler, MD Authorized by: Sherwood Gambler, MD   Critical care provider statement:    Critical care time (minutes):  35   Critical care time was exclusive of:  Separately billable procedures and treating other patients   Critical care was necessary to treat or prevent imminent or life-threatening deterioration of the following conditions:  Sepsis   Critical care was time spent personally by me on the following activities:  Discussions with consultants, evaluation of patient's response to treatment, examination of patient, ordering and performing treatments and  interventions, ordering and review of laboratory studies, ordering and review of radiographic studies, pulse oximetry, re-evaluation of patient's condition, obtaining history from patient or surrogate and review of old charts     Medications Ordered in ED Medications  sodium chloride 0.9 % bolus 1,000 mL (has no administration in time range)  vancomycin (VANCOCIN) IVPB 1000 mg/200 mL premix (has no administration in time range)  cefTRIAXone (ROCEPHIN) 2 g  in sodium chloride 0.9 % 100 mL IVPB (has no administration in time range)  acetaminophen (TYLENOL) tablet 1,000 mg (has no administration in time range)    ED Course  I have reviewed the triage vital signs and the nursing notes.  Pertinent labs & imaging results that were available during my care of the patient were reviewed by me and considered in my medical decision making (see chart for details).    MDM Rules/Calculators/A&P                          Patient meets criteria for sepsis with his white blood cell count, tachycardia, fever and lactate.  No signs of shock.  He appears to have a cellulitis as the cause for his symptoms.  However no significant pain, pain out of proportion to suggest deep space infection.  No obvious abscess on exam.  Discussed with Dr. Marylyn Ishihara for admission. Final Clinical Impression(s) / ED Diagnoses Final diagnoses:  Sepsis [place] [facility])    Rx / DC Orders ED Discharge Orders    None       Sherwood Gambler, MD 11/04/20 1511

## 2020-11-04 NOTE — Progress Notes (Signed)
elink monitoring for sepsis protocol 

## 2020-11-05 DIAGNOSIS — A419 Sepsis, unspecified organism: Principal | ICD-10-CM

## 2020-11-05 DIAGNOSIS — L03115 Cellulitis of right lower limb: Secondary | ICD-10-CM | POA: Diagnosis not present

## 2020-11-05 LAB — COMPREHENSIVE METABOLIC PANEL
ALT: 7 U/L (ref 0–44)
AST: 19 U/L (ref 15–41)
Albumin: 3 g/dL — ABNORMAL LOW (ref 3.5–5.0)
Alkaline Phosphatase: 51 U/L (ref 38–126)
Anion gap: 6 (ref 5–15)
BUN: 20 mg/dL (ref 8–23)
CO2: 23 mmol/L (ref 22–32)
Calcium: 7.9 mg/dL — ABNORMAL LOW (ref 8.9–10.3)
Chloride: 106 mmol/L (ref 98–111)
Creatinine, Ser: 0.75 mg/dL (ref 0.61–1.24)
GFR, Estimated: >60 mL/min (ref >60)
Glucose, Bld: 93 mg/dL (ref 70–99)
Potassium: 4.1 mmol/L (ref 3.5–5.1)
Sodium: 135 mmol/L (ref 135–145)
Total Bilirubin: 0.7 mg/dL (ref 0.3–1.2)
Total Protein: 5.5 g/dL — ABNORMAL LOW (ref 6.5–8.1)

## 2020-11-05 LAB — CBC
HCT: 35.8 % — ABNORMAL LOW (ref 39.0–52.0)
Hemoglobin: 11.2 g/dL — ABNORMAL LOW (ref 13.0–17.0)
MCH: 29.6 pg (ref 26.0–34.0)
MCHC: 31.3 g/dL (ref 30.0–36.0)
MCV: 94.7 fL (ref 80.0–100.0)
Platelets: 180 10*3/uL (ref 150–400)
RBC: 3.78 MIL/uL — ABNORMAL LOW (ref 4.22–5.81)
RDW: 13.9 % (ref 11.5–15.5)
WBC: 11.8 10*3/uL — ABNORMAL HIGH (ref 4.0–10.5)
nRBC: 0 % (ref 0.0–0.2)

## 2020-11-05 LAB — CORTISOL-AM, BLOOD: Cortisol - AM: 16.8 ug/dL (ref 6.7–22.6)

## 2020-11-05 LAB — PROCALCITONIN: Procalcitonin: 0.23 ng/mL

## 2020-11-05 LAB — PROTIME-INR
INR: 1.2 (ref 0.8–1.2)
Prothrombin Time: 15.6 seconds — ABNORMAL HIGH (ref 11.4–15.2)

## 2020-11-05 NOTE — Progress Notes (Signed)
PROGRESS NOTE    KURTISS WENCE  YCX:[id number] DOB: [birth year] DOA: 11/04/2020 PCP: [name], MD  Outpatient Specialists:    Brief Narrative:  As per H&P done on admission: "HAGEN BOHORQUEZ is a 75 y.o. male with medical history significant of Parkinson's disease, HLD, HTN. Presenting with fevers. History from wife. She reports that the patient was in his normal state of health until this morning. She noticed that he had a fever. It was also noted that his right leg lymphedema seemed worse today. He complained of nausea as well as a general malaise. She became concerned and called EMS to bring him to the ED. They deny any other aggravating or alleviating factors.    ED Course: He was found to be septic. He was started on vanc/rocephin w/ the source presumed to be his RLE. TRH was called for admission".   11/05/2020: Patient seen alongside patient's wife.  Right lower extremity cellulitis is improving.  Patient feels better.   Assessment & Plan:   Active Problems:   Cellulitis  Sepsis secondary to RLE cellulitis: -Sepsis physiology has resolved. -Cellulitis-right lower extremity is improving. -Continue IV vancomycin and Rocephin for now. -Ultrasound of right lower extremity was negative for abscess.     Parkinson's disease     - continue home regimen  HLD     - continue statin  Chronic BLE lymphedema     - LLE is at baseline; it is erythematous, but not warm  DVT prophylaxis: Lovenox Code Status: DO NOT RESUSCITATE Family Communication: Wife was at bedside Disposition Plan: Home eventually.     Consultants:   None  Procedures:   None.  Antimicrobials:   IV vancomycin.  IV Rocephin   Subjective: No new complaints. Right lower extremity cellulitis is improving.  Objective: Vitals:   11/05/20 0522 11/05/20 0523 11/05/20 0700 11/05/20 1504  BP:  (!) 116/58  112/61  Pulse: 85 89  80  Resp: 17   16  Temp: 98.4 F (36.9 C)   99.1 F (37.3 C)   TempSrc: Oral   Oral  SpO2: 94%   96%  Weight:   120.8 kg   Height:        Intake/Output Summary (Last 24 hours) at 11/05/2020 1621 Last data filed at 11/05/2020 0900 Gross per 24 hour  Intake 240 ml  Output 1500 ml  Net -1260 ml   Filed Weights   11/04/20 1315 11/04/20 1833 11/05/20 0700  Weight: 122.5 kg 120.8 kg 120.8 kg    Examination:  General exam: Appears calm and comfortable.  Patient is obese. Respiratory system: Clear to auscultation. Respiratory effort normal. Cardiovascular system: S1 & S2  Gastrointestinal system: Abdomen is obese, soft and nontender. No organomegaly or masses felt. Normal bowel sounds heard. Central nervous system: Alert and oriented.  Patient moves all extremities. Extremities: Bilateral chronic lymphedema of the lower extremities, with cellulitis of right lower extremity that is improving.  Data Reviewed: I have personally reviewed following labs and imaging studies  CBC: Recent Labs  Lab 11/04/20 1337 11/05/20 0540  WBC 22.2* 11.8*  NEUTROABS 20.2*  --   HGB 13.2 11.2*  HCT 41.2 35.8*  MCV 93.6 94.7  PLT 222 497   Basic Metabolic Panel: Recent Labs  Lab 11/04/20 1337 11/05/20 0540  NA 136 135  K 4.7 4.1  CL 103 106  CO2 26 23  GLUCOSE 107* 93  BUN 24* 20  CREATININE 0.95 0.75  CALCIUM 8.5* 7.9*   GFR:  Estimated Creatinine Clearance: 100.8 mL/min (by C-G formula based on SCr of 0.75 mg/dL). Liver Function Tests: Recent Labs  Lab 11/04/20 1337 11/05/20 0540  AST 33 19  ALT 8 7  ALKPHOS 69 51  BILITOT 0.6 0.7  PROT 7.0 5.5*  ALBUMIN 3.8 3.0*   No results for input(s): LIPASE, AMYLASE in the last 168 hours. No results for input(s): AMMONIA in the last 168 hours. Coagulation Profile: Recent Labs  Lab 11/04/20 1804 11/05/20 0540  INR 1.2 1.2   Cardiac Enzymes: No results for input(s): CKTOTAL, CKMB, CKMBINDEX, TROPONINI in the last 168 hours. BNP (last 3 results) No results for input(s): PROBNP in the last  8760 hours. HbA1C: No results for input(s): HGBA1C in the last 72 hours. CBG: No results for input(s): GLUCAP in the last 168 hours. Lipid Profile: No results for input(s): CHOL, HDL, LDLCALC, TRIG, CHOLHDL, LDLDIRECT in the last 72 hours. Thyroid Function Tests: No results for input(s): TSH, T4TOTAL, FREET4, T3FREE, THYROIDAB in the last 72 hours. Anemia Panel: No results for input(s): VITAMINB12, FOLATE, FERRITIN, TIBC, IRON, RETICCTPCT in the last 72 hours. Urine analysis:    Component Value Date/Time   COLORURINE YELLOW 11/04/2020 1630   APPEARANCEUR CLEAR 11/04/2020 1630   LABSPEC 1.025 11/04/2020 1630   PHURINE 7.0 11/04/2020 1630   GLUCOSEU NEGATIVE 11/04/2020 1630   HGBUR NEGATIVE 11/04/2020 1630   BILIRUBINUR NEGATIVE 11/04/2020 1630   BILIRUBINUR n 02/09/2017 1214   KETONESUR 5 (A) 11/04/2020 1630   PROTEINUR NEGATIVE 11/04/2020 1630   UROBILINOGEN 0.2 02/09/2017 1214   NITRITE NEGATIVE 11/04/2020 1630   LEUKOCYTESUR NEGATIVE 11/04/2020 1630   Sepsis Labs: @LABRCNTIP (procalcitonin:4,lacticidven:4)  ) Recent Results (from the past 240 hour(s))  Blood Culture (routine x 2)     Status: None (Preliminary result)   Collection Time: 11/04/20  2:09 PM   Specimen: Site Not Specified; Blood  Result Value Ref Range Status   Specimen Description   Final    SITE NOT SPECIFIED Performed at [facility], [place] [address], [place], [place] [address]    Special Requests   Final    BOTTLES DRAWN AEROBIC AND ANAEROBIC Blood Culture adequate volume Performed at [facility] [address], [place], [place] [address]    Culture   Final    NO GROWTH < 24 HOURS Performed at [facility] [address] [place], [place] [address]    Report Status PENDING  Incomplete  Blood Culture (routine x 2)     Status: None (Preliminary result)   Collection Time: 11/04/20  2:13 PM   Specimen: Site Not Specified; Blood  Result Value Ref Range  Status   Specimen Description   Final    SITE NOT SPECIFIED Performed at [facility] [address] [place], [place] [address]    Special Requests   Final    BOTTLES DRAWN AEROBIC AND ANAEROBIC Blood Culture adequate volume Performed at [facility] [address] [place] [address] [place], [place] [address]    Culture   Final    NO GROWTH < 24 HOURS Performed at [facility] [address], [place],  [address]    Report Status PENDING  Incomplete  Resp Panel by RT-PCR (Flu A&B, Covid) Nasopharyngeal Swab     Status: None   Collection Time: 11/04/20  2:26 PM   Specimen: Nasopharyngeal Swab; Nasopharyngeal(NP) swabs in vial transport medium  Result Value Ref Range Status   SARS Coronavirus 2 by  RT PCR NEGATIVE NEGATIVE Final    Comment: (NOTE) SARS-CoV-2 target nucleic acids are NOT DETECTED.  The SARS-CoV-2 RNA is generally detectable in upper respiratory specimens during the acute phase of infection. The lowest concentration of SARS-CoV-2 viral copies this assay can detect is 138 copies/mL. A negative result does not preclude SARS-Cov-2 infection and should not be used as the sole basis for treatment or other patient management decisions. A negative result may occur with  improper specimen collection/handling, submission of specimen other than nasopharyngeal swab, presence of viral mutation(s) within the areas targeted by this assay, and inadequate number of viral copies(<138 copies/mL). A negative result must be combined with clinical observations, patient history, and epidemiological information. The expected result is Negative.  Fact Sheet for Patients:  EntrepreneurPulse.com.au  Fact Sheet for Healthcare Providers:  IncredibleEmployment.be  This test is no t yet approved or cleared by the [place] FDA and  has been authorized for detection and/or diagnosis of SARS-CoV-2 by FDA under an  Emergency Use Authorization (EUA). This EUA will remain  in effect (meaning this test can be used) for the duration of the COVID-19 declaration under Section 564(b)(1) of the Act, 21 U.S.C.section 360bbb-3(b)(1), unless the authorization is terminated  or revoked sooner.       Influenza A by PCR NEGATIVE NEGATIVE Final   Influenza B by PCR NEGATIVE NEGATIVE Final    Comment: (NOTE) The Xpert Xpress SARS-CoV-2/FLU/RSV plus assay is intended as an aid in the diagnosis of influenza from Nasopharyngeal swab specimens and should not be used as a sole basis for treatment. Nasal washings and aspirates are unacceptable for Xpert Xpress SARS-CoV-2/FLU/RSV testing.  Fact Sheet for Patients: EntrepreneurPulse.com.au  Fact Sheet for Healthcare Providers: IncredibleEmployment.be  This test is not yet approved or cleared by the [place] FDA and has been authorized for detection and/or diagnosis of SARS-CoV-2 by FDA under an Emergency Use Authorization (EUA). This EUA will remain in effect (meaning this test can be used) for the duration of the COVID-19 declaration under Section 564(b)(1) of the Act, 21 U.S.C. section 360bbb-3(b)(1), unless the authorization is terminated or revoked.  Performed at [facility], [place] [address], [place],  [address]          Radiology Studies: [facility] Chest Port 1 View  Result Date: 11/04/2020 CLINICAL DATA:  Questionable sepsis, short of breath EXAM: PORTABLE CHEST 1 VIEW COMPARISON:  04/12/2020 FINDINGS: The heart size and mediastinal contours are within normal limits. Both lungs are clear. The visualized skeletal structures are unremarkable. IMPRESSION: No active disease. Electronically Signed   By: Randa Ngo M.D.   On: 11/04/2020 15:05   DG Tibia/Fibula Right Port  Result Date: 11/04/2020 CLINICAL DATA:  Right lower leg pain, cellulitis EXAM: PORTABLE RIGHT TIBIA AND FIBULA - 2 VIEW  COMPARISON:  10/20/2020 FINDINGS: Frontal and lateral views of the right tibia and fibula are obtained. There is diffuse lower extremity subcutaneous edema, without significant change since prior study. No subcutaneous gas. There are no acute or destructive bony lesions. Alignment remains anatomic. IMPRESSION: 1. Stable diffuse soft tissue edema. No acute or destructive bony lesion. Electronically Signed   By: Randa Ngo M.D.   On: 11/04/2020 15:06   [place] RT LOWER EXTREM LTD SOFT TISSUE NON VASCULAR  Result Date: 11/04/2020 CLINICAL DATA:  Right lower leg pain, cellulitis EXAM: ULTRASOUND RIGHT LOWER EXTREMITY LIMITED TECHNIQUE: Ultrasound examination of the lower extremity soft tissues was performed in the area of clinical concern. COMPARISON:  11/04/2020  FINDINGS: Sonographic evaluation of the right calf and foot was performed. There is diffuse interstitial edema without fluid collection or abscess. Increased vascularity consistent with history of cellulitis. IMPRESSION: 1. Diffuse subcutaneous edema consistent with cellulitis. No fluid collection or abscess. Electronically Signed   By: Randa Ngo M.D.   On: 11/04/2020 19:02        Scheduled Meds: . carbidopa-levodopa  2 tablet Oral QID  . enoxaparin (LOVENOX) injection  60 mg Subcutaneous Q24H  . rosuvastatin  10 mg Oral Daily   Continuous Infusions: . cefTRIAXone (ROCEPHIN)  IV 2 g (11/05/20 1518)  . vancomycin       LOS: 1 day    Time spent: 35 minutes.   Dana Allan, MD  [facility] Pager #: [phone] 7PM-7AM contact night coverage as above

## 2020-11-06 DIAGNOSIS — L03115 Cellulitis of right lower limb: Secondary | ICD-10-CM | POA: Diagnosis not present

## 2020-11-06 DIAGNOSIS — A419 Sepsis, unspecified organism: Secondary | ICD-10-CM

## 2020-11-06 NOTE — Progress Notes (Signed)
PROGRESS NOTE    Nicholas [name]  NKN:[id number] DOB: 1946-05-29 DOA: 11/04/2020 PCP: [name], MD  Outpatient Specialists:    Brief Narrative:  As per H&P done on admission: "Nicholas [name] is a 75 y.o. male with medical history significant of Parkinson's disease, HLD, HTN. Presenting with fevers. History from wife. She reports that the patient was in his normal state of health until this morning. She noticed that he had a fever. It was also noted that his right leg lymphedema seemed worse today. He complained of nausea as well as a general malaise. She became concerned and called EMS to bring him to the ED. They deny any other aggravating or alleviating factors.    ED Course: He was found to be septic. He was started on vanc/rocephin w/ the source presumed to be his RLE. TRH was called for admission".   11/05/2020: Patient seen alongside patient's wife.  Right lower extremity cellulitis is improving.  Patient feels better. 11/06/2020: Patient seen.  Right lower extremity cellulitis is slowly improving.  We will continue IV antibiotics for now.   Assessment & Plan:   Active Problems:   Cellulitis   Sepsis without acute organ dysfunction ([place])  Sepsis secondary to RLE cellulitis: -Sepsis physiology has resolved. -Cellulitis-right lower extremity is slowly improving. -Continue IV vancomycin and Rocephin for now. -Ultrasound of right lower extremity was negative for abscess.     Parkinson's disease     - continue home regimen  HLD     - continue statin  Chronic BLE lymphedema     - LLE is at baseline; it is erythematous, but not warm  DVT prophylaxis: Lovenox Code Status: DO NOT RESUSCITATE Family Communication: Wife was at bedside Disposition Plan: Home eventually.     Consultants:   None  Procedures:   None.  Antimicrobials:   IV vancomycin.  IV Rocephin   Subjective: No new complaints. Right lower extremity cellulitis is slowly  improving.  Objective: Vitals:   11/05/20 2244 11/06/20 0459 11/06/20 0500 11/06/20 1400  BP: 133/66 132/70  111/63  Pulse: 82 81  70  Resp: 18 20  16   Temp: 98.5 F (36.9 C) 98 F (36.7 C)  98 F (36.7 C)  TempSrc: Oral Oral  Oral  SpO2: 95% 94%  96%  Weight:   124.6 kg   Height:        Intake/Output Summary (Last 24 hours) at 11/06/2020 1603 Last data filed at 11/06/2020 1401 Gross per 24 hour  Intake 480 ml  Output 2650 ml  Net -2170 ml   Filed Weights   11/04/20 1833 11/05/20 0700 11/06/20 0500  Weight: 120.8 kg 120.8 kg 124.6 kg    Examination:  General exam: Appears calm and comfortable.  Patient is obese. Respiratory system: Clear to auscultation. Respiratory effort normal. Cardiovascular system: S1 & S2  Gastrointestinal system: Abdomen is obese, soft and nontender. No organomegaly or masses felt. Normal bowel sounds heard. Central nervous system: Alert and oriented.  Patient moves all extremities. Extremities: Bilateral chronic lymphedema of the lower extremities, with cellulitis of right lower extremity that is improving.  Data Reviewed: I have personally reviewed following labs and imaging studies  CBC: Recent Labs  Lab 11/04/20 1337 11/05/20 0540  WBC 22.2* 11.8*  NEUTROABS 20.2*  --   HGB 13.2 11.2*  HCT 41.2 35.8*  MCV 93.6 94.7  PLT 222 379   Basic Metabolic Panel: Recent Labs  Lab 11/04/20 1337 11/05/20 0540  NA 136 135  K 4.7 4.1  CL 103 106  CO2 26 23  GLUCOSE 107* 93  BUN 24* 20  CREATININE 0.95 0.75  CALCIUM 8.5* 7.9*   GFR: Estimated Creatinine Clearance: 102.6 mL/min (by C-G formula based on SCr of 0.75 mg/dL). Liver Function Tests: Recent Labs  Lab 11/04/20 1337 11/05/20 0540  AST 33 19  ALT 8 7  ALKPHOS 69 51  BILITOT 0.6 0.7  PROT 7.0 5.5*  ALBUMIN 3.8 3.0*   No results for input(s): LIPASE, AMYLASE in the last 168 hours. No results for input(s): AMMONIA in the last 168 hours. Coagulation Profile: Recent Labs   Lab 11/04/20 1804 11/05/20 0540  INR 1.2 1.2   Cardiac Enzymes: No results for input(s): CKTOTAL, CKMB, CKMBINDEX, TROPONINI in the last 168 hours. BNP (last 3 results) No results for input(s): PROBNP in the last 8760 hours. HbA1C: No results for input(s): HGBA1C in the last 72 hours. CBG: No results for input(s): GLUCAP in the last 168 hours. Lipid Profile: No results for input(s): CHOL, HDL, LDLCALC, TRIG, CHOLHDL, LDLDIRECT in the last 72 hours. Thyroid Function Tests: No results for input(s): TSH, T4TOTAL, FREET4, T3FREE, THYROIDAB in the last 72 hours. Anemia Panel: No results for input(s): VITAMINB12, FOLATE, FERRITIN, TIBC, IRON, RETICCTPCT in the last 72 hours. Urine analysis:    Component Value Date/Time   COLORURINE YELLOW 11/04/2020 1630   APPEARANCEUR CLEAR 11/04/2020 1630   LABSPEC 1.025 11/04/2020 1630   PHURINE 7.0 11/04/2020 1630   GLUCOSEU NEGATIVE 11/04/2020 1630   HGBUR NEGATIVE 11/04/2020 1630   BILIRUBINUR NEGATIVE 11/04/2020 1630   BILIRUBINUR n 02/09/2017 1214   KETONESUR 5 (A) 11/04/2020 1630   PROTEINUR NEGATIVE 11/04/2020 1630   UROBILINOGEN 0.2 02/09/2017 1214   NITRITE NEGATIVE 11/04/2020 1630   LEUKOCYTESUR NEGATIVE 11/04/2020 1630   Sepsis Labs: @LABRCNTIP (procalcitonin:4,lacticidven:4)  ) Recent Results (from the past 240 hour(s))  Blood Culture (routine x 2)     Status: None (Preliminary result)   Collection Time: 11/04/20  2:09 PM   Specimen: Site Not Specified; Blood  Result Value Ref Range Status   Specimen Description   Final    SITE NOT SPECIFIED Performed at [facility], [place] [address] [place], [place] [address]    Special Requests   Final    BOTTLES DRAWN AEROBIC AND ANAEROBIC Blood Culture adequate volume Performed at [place] [address], [place], [place] [address]    Culture   Final    NO GROWTH 2 DAYS Performed at [place] [address] [place], [place]  [address]    Report Status PENDING  Incomplete  Blood Culture (routine x 2)     Status: None (Preliminary result)   Collection Time: 11/04/20  2:13 PM   Specimen: Site Not Specified; Blood  Result Value Ref Range Status   Specimen Description   Final    SITE NOT SPECIFIED Performed at [facility] [place] [address] [place], [place] [address]    Special Requests   Final    BOTTLES DRAWN AEROBIC AND ANAEROBIC Blood Culture adequate volume Performed at [place] [address] [place], [place] [address]    Culture   Final    NO GROWTH 2 DAYS Performed at [place] [address] [place], [place] [address]    Report Status PENDING  Incomplete  Resp Panel by RT-PCR (Flu A&B, Covid) Nasopharyngeal Swab     Status: None   Collection  Time: 11/04/20  2:26 PM   Specimen: Nasopharyngeal Swab; Nasopharyngeal(NP) swabs in vial transport medium  Result Value Ref Range Status   SARS Coronavirus 2 by RT PCR NEGATIVE NEGATIVE Final    Comment: (NOTE) SARS-CoV-2 target nucleic acids are NOT DETECTED.  The SARS-CoV-2 RNA is generally detectable in upper respiratory specimens during the acute phase of infection. The lowest concentration of SARS-CoV-2 viral copies this assay can detect is 138 copies/mL. A negative result does not preclude SARS-Cov-2 infection and should not be used as the sole basis for treatment or other patient management decisions. A negative result may occur with  improper specimen collection/handling, submission of specimen other than nasopharyngeal swab, presence of viral mutation(s) within the areas targeted by this assay, and inadequate number of viral copies(<138 copies/mL). A negative result must be combined with clinical observations, patient history, and epidemiological information. The expected result is Negative.  Fact Sheet for Patients:  EntrepreneurPulse.com.au  Fact Sheet for Healthcare  Providers:  IncredibleEmployment.be  This test is no t yet approved or cleared by the [place] FDA and  has been authorized for detection and/or diagnosis of SARS-CoV-2 by FDA under an Emergency Use Authorization (EUA). This EUA will remain  in effect (meaning this test can be used) for the duration of the COVID-19 declaration under Section 564(b)(1) of the Act, 21 U.S.C.section 360bbb-3(b)(1), unless the authorization is terminated  or revoked sooner.       Influenza A by PCR NEGATIVE NEGATIVE Final   Influenza B by PCR NEGATIVE NEGATIVE Final    Comment: (NOTE) The Xpert Xpress SARS-CoV-2/FLU/RSV plus assay is intended as an aid in the diagnosis of influenza from Nasopharyngeal swab specimens and should not be used as a sole basis for treatment. Nasal washings and aspirates are unacceptable for Xpert Xpress SARS-CoV-2/FLU/RSV testing.  Fact Sheet for Patients: EntrepreneurPulse.com.au  Fact Sheet for Healthcare Providers: IncredibleEmployment.be  This test is not yet approved or cleared by the [place] FDA and has been authorized for detection and/or diagnosis of SARS-CoV-2 by FDA under an Emergency Use Authorization (EUA). This EUA will remain in effect (meaning this test can be used) for the duration of the COVID-19 declaration under Section 564(b)(1) of the Act, 21 U.S.C. section 360bbb-3(b)(1), unless the authorization is terminated or revoked.  Performed at [facility], [place] [address] [place], [place] [address]   Urine culture     Status: Abnormal (Preliminary result)   Collection Time: 11/04/20  4:30 PM   Specimen: In/Out Cath Urine  Result Value Ref Range Status   Specimen Description   Final    IN/OUT CATH URINE Performed at [facility] [address] [place], [place] [address]    Special Requests   Final    NONE Performed at [facility], [place] [address] [place], [place] [address]    Culture (A)  Final    5,000 COLONIES/mL PSEUDOMONAS AERUGINOSA SUSCEPTIBILITIES TO FOLLOW Performed at [facility], [place] [address], [place], [place] [address]    Report Status PENDING  Incomplete         Radiology Studies: [place] RT LOWER EXTREM LTD SOFT TISSUE NON VASCULAR  Result Date: 11/04/2020 CLINICAL DATA:  Right lower leg pain, cellulitis EXAM: ULTRASOUND RIGHT LOWER EXTREMITY LIMITED TECHNIQUE: Ultrasound examination of the lower extremity soft tissues was performed in the area of clinical concern. COMPARISON:  11/04/2020 FINDINGS: Sonographic evaluation of the right calf and foot was performed. There is diffuse interstitial  edema without fluid collection or abscess. Increased vascularity consistent with history of cellulitis. IMPRESSION: 1. Diffuse subcutaneous edema consistent with cellulitis. No fluid collection or abscess. Electronically Signed   By: Randa Ngo M.D.   On: 11/04/2020 19:02        Scheduled Meds: . carbidopa-levodopa  2 tablet Oral QID  . enoxaparin (LOVENOX) injection  60 mg Subcutaneous Q24H  . rosuvastatin  10 mg Oral Daily   Continuous Infusions: . cefTRIAXone (ROCEPHIN)  IV 2 g (11/06/20 1414)  . vancomycin 1,750 mg (11/05/20 2250)     LOS: 2 days    Time spent: 25 minutes.   Dana Allan, MD  [facility] Pager #: [phone] 7PM-7AM contact night coverage as above

## 2020-11-07 DIAGNOSIS — L03115 Cellulitis of right lower limb: Secondary | ICD-10-CM | POA: Diagnosis not present

## 2020-11-07 LAB — URINE CULTURE: Culture: 5000 — AB

## 2020-11-07 NOTE — Progress Notes (Signed)
PROGRESS NOTE    Nicholas [name]  NLZ:[id number] DOB: 12/07/1945 DOA: 11/04/2020 PCP: [name], MD  Outpatient Specialists:    Brief Narrative:  As per H&P done on admission: "Nicholas [name] is a 75 y.o. male with medical history significant of Parkinson's disease, HLD, HTN. Presenting with fevers. History from wife. She reports that the patient was in his normal state of health until this morning. She noticed that he had a fever. It was also noted that his right leg lymphedema seemed worse today. He complained of nausea as well as a general malaise. She became concerned and called EMS to bring him to the ED. They deny any other aggravating or alleviating factors.    ED Course: He was found to be septic. He was started on vanc/rocephin w/ the source presumed to be his RLE. TRH was called for admission".   11/05/2020: Patient seen alongside patient's wife.  Right lower extremity cellulitis is improving.  Patient feels better. 11/06/2020: Patient seen.  Right lower extremity cellulitis is slowly improving.  We will continue IV antibiotics for now. 11/07/2020: Patient seen alongside patient's wife.  Right lower extremity cellulitis is improving.  Likely discharge tomorrow.   Assessment & Plan:   Active Problems:   Cellulitis   Sepsis without acute organ dysfunction ([place])  Sepsis secondary to RLE cellulitis: -Sepsis physiology has resolved. -Cellulitis-right lower extremity is slowly improving. -Continue IV vancomycin and Rocephin for now. -Ultrasound of right lower extremity was negative for abscess. 11/07/2020: Sepsis has resolved.  Right lower extremity cellulitis has improved.  Likely discharge in the morning.     Parkinson's disease     - continue home regimen  HLD     - continue statin  Chronic BLE lymphedema     - LLE is at baseline; it is erythematous, but not warm  DVT prophylaxis: Lovenox Code Status: DO NOT RESUSCITATE Family Communication: Wife was at  bedside Disposition Plan: Home eventually.     Consultants:   None  Procedures:   None.  Antimicrobials:   IV vancomycin.  IV Rocephin   Subjective: No new complaints. Right lower extremity cellulitis has improved.   Objective: Vitals:   11/06/20 1400 11/06/20 2018 11/07/20 0615 11/07/20 1455  BP: 111/63 129/66 139/78 127/71  Pulse: 70 84 79 77  Resp: 16  20 16   Temp: 98 F (36.7 C) 98.2 F (36.8 C) 98.7 F (37.1 C) 98.8 F (37.1 C)  TempSrc: Oral Oral Oral Oral  SpO2: 96% 97% 95% 96%  Weight:      Height:        Intake/Output Summary (Last 24 hours) at 11/07/2020 1616 Last data filed at 11/07/2020 1456 Gross per 24 hour  Intake 360 ml  Output 1900 ml  Net -1540 ml   Filed Weights   11/04/20 1833 11/05/20 0700 11/06/20 0500  Weight: 120.8 kg 120.8 kg 124.6 kg    Examination:  General exam: Appears calm and comfortable.  Patient is obese. Respiratory system: Clear to auscultation. Respiratory effort normal. Cardiovascular system: S1 & S2  Gastrointestinal system: Abdomen is obese, soft and nontender. No organomegaly or masses felt. Normal bowel sounds heard. Central nervous system: Alert and oriented.  Patient moves all extremities. Extremities: Bilateral chronic lymphedema of the lower extremities, with cellulitis of right lower extremity that is improving.  Data Reviewed: I have personally reviewed following labs and imaging studies  CBC: Recent Labs  Lab 11/04/20 1337 11/05/20 0540  WBC 22.2* 11.8*  NEUTROABS 20.2*  --  HGB 13.2 11.2*  HCT 41.2 35.8*  MCV 93.6 94.7  PLT 222 371   Basic Metabolic Panel: Recent Labs  Lab 11/04/20 1337 11/05/20 0540  NA 136 135  K 4.7 4.1  CL 103 106  CO2 26 23  GLUCOSE 107* 93  BUN 24* 20  CREATININE 0.95 0.75  CALCIUM 8.5* 7.9*   GFR: Estimated Creatinine Clearance: 102.6 mL/min (by C-G formula based on SCr of 0.75 mg/dL). Liver Function Tests: Recent Labs  Lab 11/04/20 1337 11/05/20 0540   AST 33 19  ALT 8 7  ALKPHOS 69 51  BILITOT 0.6 0.7  PROT 7.0 5.5*  ALBUMIN 3.8 3.0*   No results for input(s): LIPASE, AMYLASE in the last 168 hours. No results for input(s): AMMONIA in the last 168 hours. Coagulation Profile: Recent Labs  Lab 11/04/20 1804 11/05/20 0540  INR 1.2 1.2   Cardiac Enzymes: No results for input(s): CKTOTAL, CKMB, CKMBINDEX, TROPONINI in the last 168 hours. BNP (last 3 results) No results for input(s): PROBNP in the last 8760 hours. HbA1C: No results for input(s): HGBA1C in the last 72 hours. CBG: No results for input(s): GLUCAP in the last 168 hours. Lipid Profile: No results for input(s): CHOL, HDL, LDLCALC, TRIG, CHOLHDL, LDLDIRECT in the last 72 hours. Thyroid Function Tests: No results for input(s): TSH, T4TOTAL, FREET4, T3FREE, THYROIDAB in the last 72 hours. Anemia Panel: No results for input(s): VITAMINB12, FOLATE, FERRITIN, TIBC, IRON, RETICCTPCT in the last 72 hours. Urine analysis:    Component Value Date/Time   COLORURINE YELLOW 11/04/2020 1630   APPEARANCEUR CLEAR 11/04/2020 1630   LABSPEC 1.025 11/04/2020 1630   PHURINE 7.0 11/04/2020 1630   GLUCOSEU NEGATIVE 11/04/2020 1630   HGBUR NEGATIVE 11/04/2020 1630   BILIRUBINUR NEGATIVE 11/04/2020 1630   BILIRUBINUR n 02/09/2017 1214   KETONESUR 5 (A) 11/04/2020 1630   PROTEINUR NEGATIVE 11/04/2020 1630   UROBILINOGEN 0.2 02/09/2017 1214   NITRITE NEGATIVE 11/04/2020 1630   LEUKOCYTESUR NEGATIVE 11/04/2020 1630   Sepsis Labs: @LABRCNTIP (procalcitonin:4,lacticidven:4)  ) Recent Results (from the past 240 hour(s))  Blood Culture (routine x 2)     Status: None (Preliminary result)   Collection Time: 11/04/20  2:09 PM   Specimen: Site Not Specified; Blood  Result Value Ref Range Status   Specimen Description   Final    SITE NOT SPECIFIED Performed at [facility], [place] [address], [place], [place] [address]    Special Requests   Final    BOTTLES DRAWN  AEROBIC AND ANAEROBIC Blood Culture adequate volume Performed at [facility] [address], [place], [place] [address]    Culture   Final    NO GROWTH 2 DAYS Performed at [facility] [address], [place], [place] [address]    Report Status PENDING  Incomplete  Blood Culture (routine x 2)     Status: None (Preliminary result)   Collection Time: 11/04/20  2:13 PM   Specimen: Site Not Specified; Blood  Result Value Ref Range Status   Specimen Description   Final    SITE NOT SPECIFIED Performed at [facility] [place] [address], [place], [place] [address]    Special Requests   Final    BOTTLES DRAWN AEROBIC AND ANAEROBIC Blood Culture adequate volume Performed at [facility] [address] [place], [place] [address]    Culture   Final    NO GROWTH 2 DAYS Performed at [facility], [address] [place], [place] [address]    Report Status PENDING  Incomplete  Resp Panel by RT-PCR (Flu A&B, Covid) Nasopharyngeal Swab     Status: None   Collection Time: 11/04/20  2:26 PM   Specimen: Nasopharyngeal Swab; Nasopharyngeal(NP) swabs in vial transport medium  Result Value Ref Range Status   SARS Coronavirus 2 by RT PCR NEGATIVE NEGATIVE Final    Comment: (NOTE) SARS-CoV-2 target nucleic acids are NOT DETECTED.  The SARS-CoV-2 RNA is generally detectable in upper respiratory specimens during the acute phase of infection. The lowest concentration of SARS-CoV-2 viral copies this assay can detect is 138 copies/mL. A negative result does not preclude SARS-Cov-2 infection and should not be used as the sole basis for treatment or other patient management decisions. A negative result may occur with  improper specimen collection/handling, submission of specimen other than nasopharyngeal swab, presence of viral mutation(s) within the areas targeted by this assay, and inadequate number of viral copies(<138 copies/mL). A  negative result must be combined with clinical observations, patient history, and epidemiological information. The expected result is Negative.  Fact Sheet for Patients:  EntrepreneurPulse.com.au  Fact Sheet for Healthcare Providers:  IncredibleEmployment.be  This test is no t yet approved or cleared by the [place] FDA and  has been authorized for detection and/or diagnosis of SARS-CoV-2 by FDA under an Emergency Use Authorization (EUA). This EUA will remain  in effect (meaning this test can be used) for the duration of the COVID-19 declaration under Section 564(b)(1) of the Act, 21 U.S.C.section 360bbb-3(b)(1), unless the authorization is terminated  or revoked sooner.       Influenza A by PCR NEGATIVE NEGATIVE Final   Influenza B by PCR NEGATIVE NEGATIVE Final    Comment: (NOTE) The Xpert Xpress SARS-CoV-2/FLU/RSV plus assay is intended as an aid in the diagnosis of influenza from Nasopharyngeal swab specimens and should not be used as a sole basis for treatment. Nasal washings and aspirates are unacceptable for Xpert Xpress SARS-CoV-2/FLU/RSV testing.  Fact Sheet for Patients: EntrepreneurPulse.com.au  Fact Sheet for Healthcare Providers: IncredibleEmployment.be  This test is not yet approved or cleared by the [place] FDA and has been authorized for detection and/or diagnosis of SARS-CoV-2 by FDA under an Emergency Use Authorization (EUA). This EUA will remain in effect (meaning this test can be used) for the duration of the COVID-19 declaration under Section 564(b)(1) of the Act, 21 U.S.C. section 360bbb-3(b)(1), unless the authorization is terminated or revoked.  Performed at [facility], [place] [address], [place], [place] [address]   Urine culture     Status: Abnormal   Collection Time: 11/04/20  4:30 PM   Specimen: In/Out Cath Urine  Result Value Ref Range Status    Specimen Description   Final    IN/OUT CATH URINE Performed at [facility] [address], [place], [place] [address]    Special Requests   Final    NONE Performed at [facility], [place] [address] [place], [place] [address]    Culture 5,000 COLONIES/mL PSEUDOMONAS AERUGINOSA (A)  Final   Report Status 11/07/2020 FINAL  Final   Organism ID, Bacteria PSEUDOMONAS AERUGINOSA (A)  Final      Susceptibility   Pseudomonas aeruginosa - MIC*    CEFTAZIDIME 4 SENSITIVE Sensitive     CIPROFLOXACIN <=0.25 SENSITIVE Sensitive     GENTAMICIN <=1 SENSITIVE Sensitive     IMIPENEM 1 SENSITIVE Sensitive     PIP/TAZO 8 SENSITIVE  Sensitive     CEFEPIME 2 SENSITIVE Sensitive     * 5,000 COLONIES/mL PSEUDOMONAS AERUGINOSA         Radiology Studies: No results found.      Scheduled Meds: . carbidopa-levodopa  2 tablet Oral QID  . enoxaparin (LOVENOX) injection  60 mg Subcutaneous Q24H  . rosuvastatin  10 mg Oral Daily   Continuous Infusions: . cefTRIAXone (ROCEPHIN)  IV 2 g (11/07/20 1310)  . vancomycin 1,750 mg (11/06/20 2207)     LOS: 3 days    Time spent: 25 minutes.   Dana Allan, MD  [facility] Pager #: [phone] 7PM-7AM contact night coverage as above

## 2020-11-08 DIAGNOSIS — L03115 Cellulitis of right lower limb: Secondary | ICD-10-CM | POA: Diagnosis not present

## 2020-11-08 DIAGNOSIS — A419 Sepsis, unspecified organism: Secondary | ICD-10-CM | POA: Diagnosis not present

## 2020-11-08 LAB — CBC WITH DIFFERENTIAL/PLATELET
Abs Immature Granulocytes: 0.04 10*3/uL (ref 0.00–0.07)
Basophils Absolute: 0 10*3/uL (ref 0.0–0.1)
Basophils Relative: 0 %
Eosinophils Absolute: 0.4 10*3/uL (ref 0.0–0.5)
Eosinophils Relative: 6 %
HCT: 37.5 % — ABNORMAL LOW (ref 39.0–52.0)
Hemoglobin: 12 g/dL — ABNORMAL LOW (ref 13.0–17.0)
Immature Granulocytes: 1 %
Lymphocytes Relative: 17 %
Lymphs Abs: 1.2 10*3/uL (ref 0.7–4.0)
MCH: 29.9 pg (ref 26.0–34.0)
MCHC: 32 g/dL (ref 30.0–36.0)
MCV: 93.5 fL (ref 80.0–100.0)
Monocytes Absolute: 0.5 10*3/uL (ref 0.1–1.0)
Monocytes Relative: 8 %
Neutro Abs: 4.6 10*3/uL (ref 1.7–7.7)
Neutrophils Relative %: 68 %
Platelets: 183 10*3/uL (ref 150–400)
RBC: 4.01 MIL/uL — ABNORMAL LOW (ref 4.22–5.81)
RDW: 13.3 % (ref 11.5–15.5)
WBC: 6.7 10*3/uL (ref 4.0–10.5)
nRBC: 0 % (ref 0.0–0.2)

## 2020-11-08 LAB — RENAL FUNCTION PANEL
Albumin: 2.9 g/dL — ABNORMAL LOW (ref 3.5–5.0)
Anion gap: 8 (ref 5–15)
BUN: 17 mg/dL (ref 8–23)
CO2: 24 mmol/L (ref 22–32)
Calcium: 8.4 mg/dL — ABNORMAL LOW (ref 8.9–10.3)
Chloride: 105 mmol/L (ref 98–111)
Creatinine, Ser: 0.78 mg/dL (ref 0.61–1.24)
GFR, Estimated: >60 mL/min (ref >60)
Glucose, Bld: 101 mg/dL — ABNORMAL HIGH (ref 70–99)
Phosphorus: 3.1 mg/dL (ref 2.5–4.6)
Potassium: 4.3 mmol/L (ref 3.5–5.1)
Sodium: 137 mmol/L (ref 135–145)

## 2020-11-08 MED ORDER — CEPHALEXIN 500 MG PO CAPS: 500.0000 mg | ORAL_CAPSULE | Freq: Four times a day (QID) | ORAL | 0 refills | Status: AC

## 2020-11-08 MED ORDER — DOXYCYCLINE HYCLATE 100 MG PO TABS: 100.0000 mg | ORAL_TABLET | Freq: Two times a day (BID) | ORAL | Status: AC

## 2020-11-08 NOTE — TOC Progression Note (Signed)
Transition of Care [facility]) - Progression Note    Patient Details  Name: Nicholas [name] MRN: [id number] Date of Birth: [birth year]  [facility]) CM/SW Contact  Joaquin Courts, RN Phone Number: 11/08/2020, 3:45 PM  Clinical Narrative:    PTAR transport arranged.    Expected Discharge Plan: [place] Barriers to Discharge: No Barriers Identified  Expected Discharge Plan and Services Expected Discharge Plan: [place]   Discharge Planning Services: CM Consult Post Acute Care Choice: [place] arrangements for the past 2 months: Single Family Home Expected Discharge Date: 11/08/20                         HH Arranged: PT,OT,RN [place]: [facility] Date [place]: 11/08/20 Time [place]: 1401 Representative spoke with at [place]: [place] ([place]) Interventions    Readmission Risk Interventions No flowsheet data found.

## 2020-11-08 NOTE — Consult Note (Signed)
WOC consulted for lymphedema compression wraps. Notified MD that this is performed as an outpatient per OT.  I will add resource list to chart for outpatient referral.    Lymphedema  Resources (updated 2020) Each site requires a referral from your primary care MD [place], [place]  [phone] (Upper extremities)  [place], [place] [phone] (Lower extremities)  [place] [address] [place], [place] [address] [phone] [facility] [place], [place] [address] [phone] [place], [address] Medical Office Building [place], [place] [phone]  [facility] [place], [place] [address] [phone]  [facility]  (only treatment for lymphedema related to cancer diagnosis) [place], [place] [address] [phone]    [facility] [address] [place], [place] [address] [phone]  [facility] [address] [place], [place] [address] [phone] [facility] [address] [place],  [address] [phone]    Discussed POC with patient and bedside nurse.  Re consult if needed, will not follow at this time. Thanks  Libbie Bartley [employer], RN,CWOCN, CNS, [place] [phone])

## 2020-11-08 NOTE — TOC Initial Note (Addendum)
Transition of Care [facility]) - Initial/Assessment Note    Patient Details  Name: Nicholas [name] MRN: [id number] Date of Birth: 1946/02/22  Transition of [facility]) CM/SW Contact:    Nicholas Courts, RN Phone Number: 11/08/2020, 2:02 PM  Clinical Narrative:                 CM received notification from MD stating patient is medically stable for dc home but spouse is hesitant about his return today.  CM spoke with spouse who reports they have a caregiver who comes Monday Wednesday and Friday, reports it would be more ideal for patient to remain another night at the hospital and return home on Tuesday.  CM explained that at this time MD states patient is medically stable and does not have a medical reason to remain in the hospital.  Spouse brings up the concerns that patient has not been up since being hospitalized, CM discussed options, including requesting PT/OT eval here, waiting for pt to be seen and then retuning home after this visit by PTAR vs returning home by PTAR now with HHPT/OT services.  Spouse is concerned that waiting for an evaluation would delay PTAR pick up and pt would be home late which is not ideal.  Spouse states he has had HHPT/OT in the past.  [facility] with [facility] that they can provided HHPT/OT/RN for patient.  CM also spoke with spouse about the possibility of caregiver coming on Tuesday to assist as well, spouse says she will look into this.  Spouse reports patient will need ambulance transport home, she has no safe way to transport him from a car into the home and into his bed.  Awaiting PTAR transport approval from HTA.  Expected Discharge Plan: [place] Barriers to Discharge: No Barriers Identified   Patient Goals and CMS Choice Patient states their goals for this hospitalization and ongoing recovery are:: to go home CMS Medicare.gov Compare Post Acute Care list provided to:: Patient Represenative (must comment) Choice offered to / list  presented to : Spouse  Expected Discharge Plan and Services Expected Discharge Plan: [place]   Discharge Planning Services: CM Consult Post Acute Care Choice: [place] arrangements for the past 2 months: Single Family Home Expected Discharge Date: 11/08/20                         HH Arranged: PT,OT,RN [place] Agency: [facility] Date [place]: 11/08/20 Time [place]: 1401 Representative spoke with at [place]: Nicholas [name] Arrangements/Services Living arrangements for the past 2 months: [place] Lives with:: Spouse   Do you feel safe going back to the place where you live?: Yes               Activities of Daily Nicholas [name] Devices/Equipment: Child psychotherapist (specify type) (4 wheeled walker) ADL Screening (condition at time of admission) Patient's cognitive ability adequate to safely complete daily activities?: Yes Is the patient deaf or have difficulty hearing?: No Does the patient have difficulty seeing, even when wearing glasses/contacts?: No Does the patient have difficulty concentrating, remembering, or making decisions?: No Patient able to express need for assistance with ADLs?: Yes Does the patient have difficulty dressing or bathing?: Yes Independently performs ADLs?: No Communication: Independent Dressing (OT): Dependent Is this a change from baseline?: Change from baseline, expected to last >3 days Grooming:  Independent Feeding: Independent Bathing: Dependent Is this a change from baseline?: Change from baseline, expected to last >3 days Toileting: Dependent Is this a change from baseline?: Change from baseline, expected to last >3days In/Out Bed: Dependent Is this a change from baseline?: Change from baseline, expected to last >3 days Walks in Home: Dependent Is this a change from baseline?: Change from baseline, expected to last  >3 days Does the patient have difficulty walking or climbing stairs?: Yes (secondary to weakness and parkinsons) Weakness of Legs: Both Weakness of Arms/Hands: None  Permission Sought/Granted                  Emotional Assessment           Psych Involvement: No (comment)  Admission diagnosis:  Cellulitis [L03.90] Sepsis ([place]) [A41.9] Severe sepsis ([place]) [A41.9, R65.20] Patient Active Problem List   Diagnosis Date Noted  . Sepsis without acute organ dysfunction ([place])   . Cellulitis 04/12/2020  . Strain of right biceps 09/12/2017  . Lymphedema 02/10/2017  . BPH associated with nocturia 05/16/2016  . Senile purpura ([place]) 05/03/2016  . OSA (obstructive sleep apnea) 12/28/2015  . Hypersomnia 11/15/2015  . Cough 11/15/2015  . 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 Dizziness 12/30/2014  . Diastolic dysfunction 15/40/0867  . Former smoker 04/29/2014  . Chronic venous insufficiency 02/20/2014  . Hyperglycemia 08/02/2012  . Morbid obesity ([place]) 07/13/2010  . History of malignant melanoma. left leg 03/09/2009  . Insomnia 10/03/2007  . Hyperlipidemia 12/13/2006  . Essential hypertension 12/13/2006   PCP:  Nicholas Olp, MD Pharmacy:   [place], [place] [address] Phone: [phone] Fax: [phone]     Social Determinants of Health (SDOH) Interventions    Readmission Risk Interventions No flowsheet data found.

## 2020-11-08 NOTE — Discharge Summary (Incomplete)
Physician Discharge Summary  Patient ID: Nicholas [name] MRN: [id number] DOB/AGE: Jan 02, 1946 75 y.o.  Admit date: 11/04/2020 Discharge date: 11/08/2020  Admission Diagnoses:  Discharge Diagnoses:  Active Problems:   Cellulitis   Sepsis without acute organ dysfunction [facility])   Discharged Condition: {condition:18240}  Hospital Course: ***  Consults: {consultation:18241}  Significant Diagnostic Studies: {diagnostics:18242}  Treatments: {Tx:18249}  Discharge Exam: Blood pressure 140/77, pulse 74, temperature 98.3 F (36.8 C), temperature source Oral, resp. rate 16, height 5\' 7"  (1.702 m), weight 124.6 kg, SpO2 95 %. {physical HQRF:7588325}  Disposition: Discharge disposition: 01-Home or Self Care       Discharge Instructions    Diet - low sodium heart healthy   Complete by: As directed    Increase activity slowly   Complete by: As directed      Allergies as of 11/08/2020      Reactions   Amoxicillin Swelling   Presumed angioedema of lips due to prednisone      Medication List    TAKE these medications   albuterol 108 (90 Base) MCG/ACT inhaler Commonly known as: VENTOLIN HFA Inhale 2 puffs into the lungs every 6 (six) hours as needed for wheezing or shortness of breath.   carbidopa-levodopa 25-100 MG tablet Commonly known as: SINEMET IR TAKE 2 TABLETS AT 7AM,11AM, 3PM, AND AT 7PM.   cephALEXin 500 MG capsule Commonly known as: KEFLEX Take 1 capsule (500 mg total) by mouth 4 (four) times daily for 7 days.   rosuvastatin 10 MG tablet Commonly known as: CRESTOR TAKE 1 TABLET ONCE DAILY.        SignedBonnell Public 11/08/2020, 1:46 PM

## 2020-11-08 NOTE — Care Management Important Message (Signed)
Important Message  Patient Details IM Letter given to the Patient. Name: Nicholas [name] MRN: [id number] Date of Birth: 1946-03-26   Medicare Important Message Given:  Yes     Kerin Salen 11/08/2020, 12:41 PM

## 2020-11-08 NOTE — Telephone Encounter (Signed)
Hospitalized for sepsis- taking him in was the right call

## 2020-11-09 LAB — CULTURE, BLOOD (ROUTINE X 2)
Culture: NO GROWTH
Culture: NO GROWTH
Special Requests: ADEQUATE
Special Requests: ADEQUATE

## 2020-11-10 ENCOUNTER — Telehealth: Payer: Self-pay

## 2020-11-10 NOTE — Telephone Encounter (Cosign Needed)
Transition Care Management Follow-up Telephone Call  Date of discharge and from where: [facility] 11[facility]08[facility]  How have you been since you were released from the hospital? ok  Any questions or concerns? No  Items Reviewed:  Did the pt receive and understand the discharge instructions provided? No   It was a cluster of information , pt declined to go over information with me stating not at this time. He did start new med given (Keflex)  Medications obtained and verified? Yes   Other? No   Any new allergies since your discharge? No   Dietary orders reviewed? No  Do you have support at home? Yes   Home Care and Equipment/Supplies: Were home health services ordered? not applicable If so, what is the name of the agency?   Has the agency set up a time to come to the patient's home? not applicable Were any new equipment or medical supplies ordered?  No What is the name of the medical supply agency?  Were you able to get the supplies/equipment? not applicable Do you have any questions related to the use of the equipment or supplies? No  Functional Questionnaire: (I = Independent and D = Dependent) ADLs: I  Bathing/Dressing- I  Meal Prep- I  Eating- I  Maintaining continence- I  Transferring/Ambulation- I  Managing Meds- I  Follow up appointments reviewed:   PCP Hospital f/u appt confirmed? No  P stated he wanted to schedule at a later time  [facility] [facility] f/u appt confirmed? No.  Are transportation arrangements needed? No   If their condition worsens, is the pt aware to call PCP or go to the Emergency Dept.? Yes  Was the patient provided with contact information for the PCP's office or ED? Yes  Was to pt encouraged to call back with questions or concerns? Yes

## 2020-11-10 NOTE — Telephone Encounter (Signed)
I am available if needed if patient changes his mind

## 2020-11-11 DIAGNOSIS — E785 Hyperlipidemia, unspecified: Secondary | ICD-10-CM | POA: Diagnosis not present

## 2020-11-11 DIAGNOSIS — I10 Essential (primary) hypertension: Secondary | ICD-10-CM | POA: Diagnosis not present

## 2020-11-11 DIAGNOSIS — R739 Hyperglycemia, unspecified: Secondary | ICD-10-CM | POA: Diagnosis not present

## 2020-11-11 DIAGNOSIS — G4733 Obstructive sleep apnea (adult) (pediatric): Secondary | ICD-10-CM | POA: Diagnosis not present

## 2020-11-11 DIAGNOSIS — Z87891 Personal history of nicotine dependence: Secondary | ICD-10-CM | POA: Diagnosis not present

## 2020-11-11 DIAGNOSIS — D692 Other nonthrombocytopenic purpura: Secondary | ICD-10-CM | POA: Diagnosis not present

## 2020-11-11 DIAGNOSIS — G4710 Hypersomnia, unspecified: Secondary | ICD-10-CM | POA: Diagnosis not present

## 2020-11-11 DIAGNOSIS — Z791 Long term (current) use of non-steroidal anti-inflammatories (NSAID): Secondary | ICD-10-CM | POA: Diagnosis not present

## 2020-11-11 DIAGNOSIS — I872 Venous insufficiency (chronic) (peripheral): Secondary | ICD-10-CM | POA: Diagnosis not present

## 2020-11-11 DIAGNOSIS — R7303 Prediabetes: Secondary | ICD-10-CM | POA: Diagnosis not present

## 2020-11-11 DIAGNOSIS — I890 Lymphedema, not elsewhere classified: Secondary | ICD-10-CM | POA: Diagnosis not present

## 2020-11-11 DIAGNOSIS — Z6841 Body Mass Index (BMI) 40.0 and over, adult: Secondary | ICD-10-CM | POA: Diagnosis not present

## 2020-11-11 DIAGNOSIS — G20 Parkinson's disease: Secondary | ICD-10-CM | POA: Diagnosis not present

## 2020-11-11 DIAGNOSIS — N400 Enlarged prostate without lower urinary tract symptoms: Secondary | ICD-10-CM | POA: Diagnosis not present

## 2020-11-16 ENCOUNTER — Telehealth: Payer: Self-pay

## 2020-11-16 NOTE — Telephone Encounter (Signed)
Home Health Verbal Orders  Requesting continuation of OT  Frequency:  Once a week for 8 weeks.

## 2020-11-16 NOTE — Telephone Encounter (Signed)
Returned the call and VO provided.

## 2020-11-17 ENCOUNTER — Telehealth: Payer: Self-pay

## 2020-11-17 NOTE — Telephone Encounter (Signed)
Returned [place] call and VO provided.

## 2020-11-17 NOTE — Telephone Encounter (Signed)
Home Health Verbal Orders  Agency:  [facility]   Requesting: PT   Frequency:  Once a week for 4 weeks

## 2020-11-24 DIAGNOSIS — N400 Enlarged prostate without lower urinary tract symptoms: Secondary | ICD-10-CM | POA: Diagnosis not present

## 2020-11-24 DIAGNOSIS — E785 Hyperlipidemia, unspecified: Secondary | ICD-10-CM | POA: Diagnosis not present

## 2020-11-24 DIAGNOSIS — R7303 Prediabetes: Secondary | ICD-10-CM | POA: Diagnosis not present

## 2020-11-24 DIAGNOSIS — Z87891 Personal history of nicotine dependence: Secondary | ICD-10-CM | POA: Diagnosis not present

## 2020-11-24 DIAGNOSIS — I872 Venous insufficiency (chronic) (peripheral): Secondary | ICD-10-CM | POA: Diagnosis not present

## 2020-11-24 DIAGNOSIS — I10 Essential (primary) hypertension: Secondary | ICD-10-CM | POA: Diagnosis not present

## 2020-11-24 DIAGNOSIS — G4710 Hypersomnia, unspecified: Secondary | ICD-10-CM | POA: Diagnosis not present

## 2020-11-24 DIAGNOSIS — D692 Other nonthrombocytopenic purpura: Secondary | ICD-10-CM | POA: Diagnosis not present

## 2020-11-24 DIAGNOSIS — G20 Parkinson's disease: Secondary | ICD-10-CM | POA: Diagnosis not present

## 2020-11-24 DIAGNOSIS — R739 Hyperglycemia, unspecified: Secondary | ICD-10-CM | POA: Diagnosis not present

## 2020-11-24 DIAGNOSIS — Z6841 Body Mass Index (BMI) 40.0 and over, adult: Secondary | ICD-10-CM | POA: Diagnosis not present

## 2020-11-24 DIAGNOSIS — I890 Lymphedema, not elsewhere classified: Secondary | ICD-10-CM | POA: Diagnosis not present

## 2020-11-24 DIAGNOSIS — G4733 Obstructive sleep apnea (adult) (pediatric): Secondary | ICD-10-CM | POA: Diagnosis not present

## 2020-11-24 DIAGNOSIS — Z791 Long term (current) use of non-steroidal anti-inflammatories (NSAID): Secondary | ICD-10-CM | POA: Diagnosis not present

## 2020-11-24 NOTE — Progress Notes (Signed)
Phone [phone] In person visit   Subjective:   Nicholas [name] is a 75 y.o. year old very pleasant male patient who presents for/with See problem oriented charting Chief Complaint  Patient presents with  . Wound Check    Patient is having very bad swelling in his legs. He has a cut on his right leg due to a wheel chair accident.    This visit occurred during the SARS-CoV-2 public health emergency.  Safety protocols were in place, including screening questions prior to the visit, additional usage of staff PPE, and extensive cleaning of exam room while observing appropriate contact time as indicated for disinfecting solutions.   Past Medical History-  Patient Active Problem List   Diagnosis Date Noted  . Lymphedema 02/10/2017    Priority: High  . 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 History of malignant melanoma. left leg 03/09/2009    Priority: High  . BPH associated with nocturia 05/16/2016    Priority: Medium  . OSA (obstructive sleep apnea) 12/28/2015    Priority: Medium  . Diastolic dysfunction 71/12/2692    Priority: Medium  . Chronic venous insufficiency 02/20/2014    Priority: Medium  . Hyperglycemia 08/02/2012    Priority: Medium  . Hyperlipidemia 12/13/2006    Priority: Medium  . Essential hypertension 12/13/2006    Priority: Medium  . Senile purpura ([place]) 05/03/2016    Priority: Low  . Hypersomnia 11/15/2015    Priority: Low  . Former smoker 04/29/2014    Priority: Low  . Morbid obesity ([place]) 07/13/2010    Priority: Low  . Insomnia 10/03/2007    Priority: Low  . Sepsis without acute organ dysfunction ([place])   . Cellulitis 04/12/2020  . Strain of right biceps 09/12/2017  . Cough 11/15/2015  . Dizziness 12/30/2014    Medications- reviewed and updated Current Outpatient Medications  Medication Sig Dispense Refill  . albuterol (VENTOLIN HFA) 108 (90 Base) MCG/ACT inhaler Inhale 2 puffs into the lungs every 6 (six) hours as  needed for wheezing or shortness of breath. 18 g 1  . carbidopa-levodopa (SINEMET IR) 25-100 MG tablet TAKE 2 TABLETS AT 7AM,11AM, 3PM, AND AT 7PM. 720 tablet 1  . rosuvastatin (CRESTOR) 10 MG tablet TAKE 1 TABLET ONCE DAILY. 90 tablet 0   No current facility-administered medications for this visit.     Objective:  BP 136/76   Pulse 92   Temp 99 F (37.2 C) (Temporal)   Ht 5\' 6"  (1.676 m)   SpO2 94%   BMI 44.34 kg/m  Gen: NAD, resting comfortably CV: RRR no murmurs rubs or gallops Lungs: CTAB no crackles, wheeze, rhonchi Ext: Profound lymphedema bilaterally Skin: warm, dry, right lower leg erythematous but similar to baseline-swelling increased without compression stockings or lymphedema treatment-on the back of the calf there are 2 lacerations at least 8 cm in length-patient had dressing changed this morning and is already soaked through an ABD pad-area was rewrapped with materials we had available Neuro: Wheelchair-bound, with a member of our staff and patient's wife were required to get him into his car    Assessment and Plan   #Sepsis related to right lower extremity cellulitis Hospital follow-up #Generalized weakness-worsened by hospitalization #Lymphedema S: Patient developed sepsis for H&P on November 04, 2020 related to cellulitis on right leg after sustaining laceration of lower extremity in a wheelchair accident.  Patient with baseline lymphedema.  Patient with 4-day hospital stay it appears with discharge on April 18-discharge  summary is still pending.  Patient was treated with vancomycin and Rocephin.  Sepsis physiology resolved.  Cellulitis was slowly improving.  Ultrasound was negative for abscess.  He has not been having worsening erythema, tenderness, erythema around the wounds  In regards to wounds they do not appear to be healing to patient or his wife-the leg ends up rubbing along the chair where he is able to sleep and it seems to prevent the wound from  healing.  Reflecting back on hospitalization- there were notes from case management which expressed concern about patient being able to be cared for at home.  Apparently patient was not gotten out of bed during a 4-day stay and he developed worsening weakness even above his baseline.  Patient is making some progress with home health physical therapy-is able to use the restroom during the daytime but with his weakness has not been able to get up quickly have to use the restroom at night-[facility] appears  Patient has had 2 falls since getting home and reports the last 1 was last Monday- strength does seem to be improving some.  His falls are primarily sliding out of his chair and then requires assistance from ambulance to get back up-denies issues during the daytime getting to the restroom or bedside commode A/P: Patient cellulitis appears to have resolved which was the source of sepsis-sepsis obviously also resolved.  On the other hand patient does have nonhealing wound with 2 lacerations on the back of his leg as well as a laceration on his toe.  Patient with profound lymphedema and this is over 3 weeks out from original laceration without any significant improvement in the wound- it would be difficult for patient to get regular wound care visits.  We are trying to set him up with more regular home health nursing visits (at least 2-week if not more) for wound care but even then I am not sure this will heal-I honestly think a skilled nursing facility/rehab would be a better option for him.  Patient also with generalized weakness due to lack of mobility during hospitalization-I think he would benefit from PT/OT at a rehab facility.  Due to continence issues patient requests potential condom catheter at nighttime- we will see if we can get this through home health and if not can order at a local medical supply store -Patient also requests referral back to  urology   Last fall last Monday- slides out of chair and needs assist  Recommended follow up: No follow-ups on file. Future Appointments  Date Time Provider [place]  01/11/2021  9:45 AM [name], [name], DO [facility] None  05/16/2021  2:30 PM [facility] HEALTH COACH [facility]    Lab/Order associations:   ICD-10-CM   1. BPH associated with nocturia  N40.1 Ambulatory referral to Urology   R35.1   2. Urinary incontinence, unspecified type  R32 Ambulatory referral to Urology  3. Wound of left lower extremity, initial encounter  S81.802A AMB Referral to [place] Management    Ambulatory referral to [place]  4. Lymphedema  I89.0 AMB Referral to [place] Management    Ambulatory referral to Home Health  5. General weakness  R53.1 AMB Referral to [place] Management    Ambulatory referral to Home Health    Time Spent: 41 minutes of total time (3: 00 PM- 3:41 PM) was spent on the date of the encounter performing the following actions: chart review prior to  seeing the patient, obtaining history, performing a medically necessary exam, counseling on the treatment plan and attempting to create a plan together to move forward and get wound healed and patient stronger, placing orders, and documenting in our EHR.   Return precautions advised.  [name], MD

## 2020-11-24 NOTE — Patient Instructions (Addendum)
We are going to place a [facility] consult to see if we can get you into rehab/skilled nursing facility.  We are going to do a referral to home health to see if we can increase nursing visits to twice a week for help with wound care until that time comes for rehab visits.  We will see if home health can provide condom catheters for overnight.  He also requested a referral To urology for the incontinence and BPH-I will place a referral for that

## 2020-11-25 ENCOUNTER — Ambulatory Visit (INDEPENDENT_AMBULATORY_CARE_PROVIDER_SITE_OTHER): Payer: PPO | Admitting: Family Medicine

## 2020-11-25 ENCOUNTER — Other Ambulatory Visit: Payer: Self-pay

## 2020-11-25 ENCOUNTER — Encounter: Payer: Self-pay | Admitting: Family Medicine

## 2020-11-25 VITALS — BP 136/76 | HR 92 | Temp 99.0°F | Ht 66.0 in

## 2020-11-25 DIAGNOSIS — N401 Enlarged prostate with lower urinary tract symptoms: Secondary | ICD-10-CM

## 2020-11-25 DIAGNOSIS — R531 Weakness: Secondary | ICD-10-CM | POA: Diagnosis not present

## 2020-11-25 DIAGNOSIS — S81802A Unspecified open wound, left lower leg, initial encounter: Secondary | ICD-10-CM | POA: Diagnosis not present

## 2020-11-25 DIAGNOSIS — I890 Lymphedema, not elsewhere classified: Secondary | ICD-10-CM | POA: Diagnosis not present

## 2020-11-25 DIAGNOSIS — R351 Nocturia: Secondary | ICD-10-CM | POA: Diagnosis not present

## 2020-11-25 DIAGNOSIS — R32 Unspecified urinary incontinence: Secondary | ICD-10-CM

## 2020-11-29 ENCOUNTER — Telehealth: Payer: Self-pay

## 2020-11-29 NOTE — Telephone Encounter (Signed)
Returned Leslie's call and VO given for increased visit to care for wound.

## 2020-11-29 NOTE — Telephone Encounter (Signed)
Called and lm for [employer].

## 2020-11-29 NOTE — Telephone Encounter (Signed)
Magda Paganini is calling in from [place], she would like to increase his visits due to the wound on the leg has opened up.

## 2020-11-29 NOTE — Telephone Encounter (Signed)
Magda Paganini is returning Nicholas [name]'s call.

## 2020-12-03 ENCOUNTER — Telehealth: Payer: Self-pay

## 2020-12-03 NOTE — Telephone Encounter (Signed)
Patient is requesting a referral to skilled nursing facility [facility] if they don't have any avalaibility for him   [facility] [place]

## 2020-12-03 NOTE — Telephone Encounter (Signed)
Team please reach out to [facility]/social worker with [facility] and see if they can assist with transition

## 2020-12-03 NOTE — Telephone Encounter (Signed)
Please advise 

## 2020-12-04 DIAGNOSIS — S46211A Strain of muscle, fascia and tendon of other parts of biceps, right arm, initial encounter: Secondary | ICD-10-CM | POA: Diagnosis not present

## 2020-12-04 DIAGNOSIS — G232 Striatonigral degeneration: Secondary | ICD-10-CM | POA: Diagnosis not present

## 2020-12-04 DIAGNOSIS — R42 Dizziness and giddiness: Secondary | ICD-10-CM | POA: Diagnosis not present

## 2020-12-04 DIAGNOSIS — I890 Lymphedema, not elsewhere classified: Secondary | ICD-10-CM | POA: Diagnosis not present

## 2020-12-06 DIAGNOSIS — R351 Nocturia: Secondary | ICD-10-CM | POA: Diagnosis not present

## 2020-12-06 DIAGNOSIS — R350 Frequency of micturition: Secondary | ICD-10-CM | POA: Diagnosis not present

## 2020-12-06 DIAGNOSIS — R3915 Urgency of urination: Secondary | ICD-10-CM | POA: Diagnosis not present

## 2020-12-06 DIAGNOSIS — N3941 Urge incontinence: Secondary | ICD-10-CM | POA: Diagnosis not present

## 2020-12-07 DIAGNOSIS — G20 Parkinson's disease: Secondary | ICD-10-CM | POA: Diagnosis not present

## 2020-12-07 DIAGNOSIS — G4710 Hypersomnia, unspecified: Secondary | ICD-10-CM | POA: Diagnosis not present

## 2020-12-07 DIAGNOSIS — I10 Essential (primary) hypertension: Secondary | ICD-10-CM | POA: Diagnosis not present

## 2020-12-07 DIAGNOSIS — R739 Hyperglycemia, unspecified: Secondary | ICD-10-CM | POA: Diagnosis not present

## 2020-12-07 DIAGNOSIS — D692 Other nonthrombocytopenic purpura: Secondary | ICD-10-CM | POA: Diagnosis not present

## 2020-12-07 DIAGNOSIS — I890 Lymphedema, not elsewhere classified: Secondary | ICD-10-CM | POA: Diagnosis not present

## 2020-12-07 DIAGNOSIS — R7303 Prediabetes: Secondary | ICD-10-CM | POA: Diagnosis not present

## 2020-12-07 DIAGNOSIS — Z6841 Body Mass Index (BMI) 40.0 and over, adult: Secondary | ICD-10-CM | POA: Diagnosis not present

## 2020-12-07 DIAGNOSIS — Z87891 Personal history of nicotine dependence: Secondary | ICD-10-CM | POA: Diagnosis not present

## 2020-12-07 DIAGNOSIS — Z791 Long term (current) use of non-steroidal anti-inflammatories (NSAID): Secondary | ICD-10-CM | POA: Diagnosis not present

## 2020-12-07 DIAGNOSIS — E785 Hyperlipidemia, unspecified: Secondary | ICD-10-CM | POA: Diagnosis not present

## 2020-12-07 DIAGNOSIS — G4733 Obstructive sleep apnea (adult) (pediatric): Secondary | ICD-10-CM | POA: Diagnosis not present

## 2020-12-07 DIAGNOSIS — N400 Enlarged prostate without lower urinary tract symptoms: Secondary | ICD-10-CM | POA: Diagnosis not present

## 2020-12-07 DIAGNOSIS — I872 Venous insufficiency (chronic) (peripheral): Secondary | ICD-10-CM | POA: Diagnosis not present

## 2020-12-07 NOTE — Telephone Encounter (Signed)
Patient called back to speak to a CMA after hours.

## 2020-12-07 NOTE — Telephone Encounter (Signed)
Patient has a home health referral entered for skill nursing.  Due to patients insurance, Lattie Haw is unable to find a [facility] agency other than the previous agency ([facility]) who will be able to take patient on.  Lattie Haw, is waiting on call back from patient.  However, if we can find inpatient care we would not need the [facility] for right now.    Please enter referral for In patient Skilled nursing facility.  Lattie Haw will take over assisting the family.      Patient is requesting to have skilled nursing at either [facility], [facility] or [facility] (3rd opt).    I have reached out to [facility].  [facility] currently does not have a bed open.  We can check back with them.  PA would need to be processed.  Would need to send any recent [facility] notes and OV notes along with referral to be faxed to [facility] at [phone].  Loree Fee can be reached at [phone].  I have left a VM for [facility].  It might not be a bad idea to reach back out to them.  I have also left a VM for patient and spouse to reach out as well to these facilities.    If [facility] calls back, I will add to this note.

## 2020-12-07 NOTE — Telephone Encounter (Signed)
I have received a call back from [facility].  As of right now they do not accept patients insurance.

## 2020-12-09 ENCOUNTER — Telehealth: Payer: Self-pay

## 2020-12-09 NOTE — Telephone Encounter (Signed)
Home Health Verbal Orders  Agency: [facility]   Requesting Social Work  Reason for Request: Patient is wanting to go to a rehab facility, and needing someone to go over the information and help assist patient.

## 2020-12-09 NOTE — Telephone Encounter (Signed)
Called and lm for pt tcb. 

## 2020-12-09 NOTE — Telephone Encounter (Signed)
Called and lm for [employer].

## 2020-12-10 NOTE — Telephone Encounter (Signed)
Returned Gracie's call and provided VO.

## 2020-12-10 NOTE — Telephone Encounter (Signed)
[name] returned [name]'s call. Please advise.

## 2020-12-14 ENCOUNTER — Telehealth: Payer: Self-pay

## 2020-12-14 NOTE — Telephone Encounter (Signed)
[facility] called and stated they need a FL2 form filled out for the patient. Plus he needs something sent to his insurance to show that he needs skilled nursing so his insurance will pay for him to stay at [facility].  If any questions [phone] Luellen Pucker

## 2020-12-14 NOTE — Telephone Encounter (Signed)
Called Nicholas [name] at the number below and was sent to vm, lm tcb.

## 2020-12-14 NOTE — Discharge Summary (Signed)
Physician Discharge Summary  Patient ID: Nicholas [name] MRN: [id number] DOB/AGE: 11/07/1945 75 y.o.  Admit date: 11/04/2020 Discharge date: 11/08/2020  Admission Diagnoses:  Discharge Diagnoses:  Active Problems:   Cellulitis   Sepsis without acute organ dysfunction [facility])   Discharged Condition: stable  Hospital Course:  Patient is a 75 year old malewith past medical history significant forParkinson's disease, hyperlipidemia and hypertension.Patient was admitted fever, cellulitis and sepsis.  It was also noted that the right leg lymphedema seemed to be worsening.  Patient was admitted for further assessment and management.  Ultrasound of right lower extremity was suggestive of cellulitis.  Patient was admitted and treated with antibiotics (Vancomycin/Rocephin).  Sepsis secondary to RLE cellulitis: -With antibiotics, sepsis physiology resolved and, cellulitis resolved significantly. -Ultrasound of right lower extremity was negative for abscess.  Parkinson's disease - continue home regimen  Hyperlipidemia: - continue statin  Chronic BLE lymphedema - LLE is at baseline; it is erythematous, but not warm  Consults: None  Significant Diagnostic Studies:  Ultrasound of right lower extremity revealed: -Diffuse subcutaneous edema consistent with cellulitis. No fluid collection or abscess  Discharge Exam: Blood pressure 140/77, pulse 74, temperature 98.3 F (36.8 C), temperature source Oral, resp. rate 16, height 5\' 7"  (1.702 m), weight 124.6 kg, SpO2 95 %.  Disposition: Discharge disposition: 01-Home or Self Care  Discharge Instructions    Diet - low sodium heart healthy   Complete by: As directed    Increase activity slowly   Complete by: As directed      Allergies as of 11/08/2020      Reactions   Amoxicillin Swelling   Presumed angioedema of lips due to prednisone      Medication List    TAKE these medications   albuterol 108 (90 Base)  MCG/ACT inhaler Commonly known as: VENTOLIN HFA Inhale 2 puffs into the lungs every 6 (six) hours as needed for wheezing or shortness of breath.   carbidopa-levodopa 25-100 MG tablet Commonly known as: SINEMET IR TAKE 2 TABLETS AT 7AM,11AM, 3PM, AND AT 7PM.   rosuvastatin 10 MG tablet Commonly known as: CRESTOR TAKE 1 TABLET ONCE DAILY.     ASK your doctor about these medications   cephALEXin 500 MG capsule Commonly known as: KEFLEX Take 1 capsule (500 mg total) by mouth 4 (four) times daily for 7 days. Ask about: Should I take this medication?        SignedBonnell Public 12/14/2020, 2:01 PM

## 2020-12-14 NOTE — Telephone Encounter (Signed)
Try to get me both forms and I will look and see. May need visit but I also recently saw him and encouraged transition in care so I may be able to use last visit

## 2020-12-14 NOTE — Telephone Encounter (Signed)
OV required for this correct?

## 2020-12-15 NOTE — Telephone Encounter (Signed)
Form has been placed into Dr. Ansel Bong basket.

## 2020-12-17 NOTE — Telephone Encounter (Signed)
Called audrey to get fax # for [facility] form ( form has been faxed) to [facility] but also [place] stated we need to call his primary insurance to get a code we agree upon to get patient seen there at facility. Can we please call audrey back to get more information or call insurance company to get code. [phone]

## 2020-12-21 ENCOUNTER — Other Ambulatory Visit: Payer: Self-pay | Admitting: Family Medicine

## 2020-12-21 NOTE — Telephone Encounter (Signed)
Called and lm for [employer].

## 2020-12-23 ENCOUNTER — Telehealth: Payer: Self-pay

## 2020-12-23 DIAGNOSIS — Z993 Dependence on wheelchair: Secondary | ICD-10-CM | POA: Diagnosis not present

## 2020-12-23 DIAGNOSIS — R7303 Prediabetes: Secondary | ICD-10-CM | POA: Diagnosis not present

## 2020-12-23 DIAGNOSIS — I119 Hypertensive heart disease without heart failure: Secondary | ICD-10-CM | POA: Diagnosis not present

## 2020-12-23 DIAGNOSIS — S81811D Laceration without foreign body, right lower leg, subsequent encounter: Secondary | ICD-10-CM | POA: Diagnosis not present

## 2020-12-23 DIAGNOSIS — G20 Parkinson's disease: Secondary | ICD-10-CM | POA: Diagnosis not present

## 2020-12-23 DIAGNOSIS — S91011D Laceration without foreign body, right ankle, subsequent encounter: Secondary | ICD-10-CM | POA: Diagnosis not present

## 2020-12-23 DIAGNOSIS — Z87820 Personal history of traumatic brain injury: Secondary | ICD-10-CM | POA: Diagnosis not present

## 2020-12-23 DIAGNOSIS — Z6841 Body Mass Index (BMI) 40.0 and over, adult: Secondary | ICD-10-CM | POA: Diagnosis not present

## 2020-12-23 DIAGNOSIS — S91111D Laceration without foreign body of right great toe without damage to nail, subsequent encounter: Secondary | ICD-10-CM | POA: Diagnosis not present

## 2020-12-23 DIAGNOSIS — Z87891 Personal history of nicotine dependence: Secondary | ICD-10-CM | POA: Diagnosis not present

## 2020-12-23 DIAGNOSIS — R351 Nocturia: Secondary | ICD-10-CM | POA: Diagnosis not present

## 2020-12-23 DIAGNOSIS — G4710 Hypersomnia, unspecified: Secondary | ICD-10-CM | POA: Diagnosis not present

## 2020-12-23 DIAGNOSIS — N401 Enlarged prostate with lower urinary tract symptoms: Secondary | ICD-10-CM | POA: Diagnosis not present

## 2020-12-23 DIAGNOSIS — I872 Venous insufficiency (chronic) (peripheral): Secondary | ICD-10-CM | POA: Diagnosis not present

## 2020-12-23 DIAGNOSIS — G4733 Obstructive sleep apnea (adult) (pediatric): Secondary | ICD-10-CM | POA: Diagnosis not present

## 2020-12-23 DIAGNOSIS — E785 Hyperlipidemia, unspecified: Secondary | ICD-10-CM | POA: Diagnosis not present

## 2020-12-23 DIAGNOSIS — L03115 Cellulitis of right lower limb: Secondary | ICD-10-CM | POA: Diagnosis not present

## 2020-12-23 DIAGNOSIS — Z9181 History of falling: Secondary | ICD-10-CM | POA: Diagnosis not present

## 2020-12-23 DIAGNOSIS — I890 Lymphedema, not elsewhere classified: Secondary | ICD-10-CM | POA: Diagnosis not present

## 2020-12-23 DIAGNOSIS — D692 Other nonthrombocytopenic purpura: Secondary | ICD-10-CM | POA: Diagnosis not present

## 2020-12-23 NOTE — Telephone Encounter (Signed)
Unable to reach Nicholas [name], I left a message for her to give [place] a call back, I was making sure she didn't need any verbal orders. But here an [place]

## 2020-12-23 NOTE — Telephone Encounter (Signed)
Okay noted. Thanks for the update.

## 2020-12-23 NOTE — Telephone Encounter (Signed)
If needs additional treatments I am agreeable

## 2020-12-23 NOTE — Telephone Encounter (Signed)
Leslie from [facility] called and said patients right leg is slowly getting better and she still will be going x2 week for wound care.

## 2020-12-23 NOTE — Telephone Encounter (Signed)
Unable to reach [place], [facility]

## 2020-12-23 NOTE — Telephone Encounter (Signed)
Webb Silversmith a Education officer, museum from [facility]  [place] - wife of patient would like to put social work apt on hold and states she would call if needed any help

## 2020-12-27 ENCOUNTER — Telehealth: Payer: Self-pay

## 2020-12-27 NOTE — Telephone Encounter (Signed)
Called and spoke with Magda Paganini and VO given.

## 2020-12-27 NOTE — Telephone Encounter (Signed)
Home Health Verbal Orders  Agency:  [facility]  Requesting:Skilled Nursing   Reason:  Silver agnate // dressing change  Frequency:  Nursing once a week // and dressing will be daily

## 2020-12-28 ENCOUNTER — Telehealth: Payer: Self-pay

## 2020-12-28 NOTE — Telephone Encounter (Signed)
Will have Jazz refax. 

## 2020-12-28 NOTE — Telephone Encounter (Signed)
Received a call from [facility], she states the bottom part of the FL2 form was cut off and is needing it to be re-faxed.

## 2020-12-28 NOTE — Telephone Encounter (Signed)
Is requesting Skilled level of Care FL2 to be completed and faxed to [phone].  Please follow up once faxed.

## 2020-12-28 NOTE — Telephone Encounter (Signed)
Please fax once complete.

## 2020-12-29 NOTE — Telephone Encounter (Signed)
FL2 form has been re faxed successfully.

## 2020-12-30 NOTE — Telephone Encounter (Signed)
Called and spoke with [facility] while I was faxing the form again.

## 2020-12-30 NOTE — Telephone Encounter (Signed)
States did not receive this fax.  Is requesting to be faxed to [phone]  Please call Corey Harold at [facility] at [phone] once faxed.

## 2021-01-02 ENCOUNTER — Encounter: Payer: Self-pay | Admitting: Family Medicine

## 2021-01-03 NOTE — Telephone Encounter (Signed)
Patient is calling in stating insurance denied his request for skilled nursing due to lack of information. Wondering if someone is able to re-fax the paperwork ASAP. Advised that Dr.Hunter is out all week.

## 2021-01-03 NOTE — Telephone Encounter (Signed)
Have forwarded this to Rosanne Sack so she can refax.

## 2021-01-04 DIAGNOSIS — R42 Dizziness and giddiness: Secondary | ICD-10-CM | POA: Diagnosis not present

## 2021-01-04 DIAGNOSIS — I890 Lymphedema, not elsewhere classified: Secondary | ICD-10-CM | POA: Diagnosis not present

## 2021-01-04 DIAGNOSIS — G232 Striatonigral degeneration: Secondary | ICD-10-CM | POA: Diagnosis not present

## 2021-01-04 DIAGNOSIS — S46211A Strain of muscle, fascia and tendon of other parts of biceps, right arm, initial encounter: Secondary | ICD-10-CM | POA: Diagnosis not present

## 2021-01-04 NOTE — Telephone Encounter (Signed)
[facility] returned call regarding Nicholas [name] Please call [phone] EXT [phone] KAREN  She has a meeting at 10 so call before10 or after 11

## 2021-01-06 DIAGNOSIS — Z9181 History of falling: Secondary | ICD-10-CM | POA: Diagnosis not present

## 2021-01-06 DIAGNOSIS — I119 Hypertensive heart disease without heart failure: Secondary | ICD-10-CM | POA: Diagnosis not present

## 2021-01-06 DIAGNOSIS — Z87820 Personal history of traumatic brain injury: Secondary | ICD-10-CM | POA: Diagnosis not present

## 2021-01-06 DIAGNOSIS — E785 Hyperlipidemia, unspecified: Secondary | ICD-10-CM | POA: Diagnosis not present

## 2021-01-06 DIAGNOSIS — S91011D Laceration without foreign body, right ankle, subsequent encounter: Secondary | ICD-10-CM | POA: Diagnosis not present

## 2021-01-06 DIAGNOSIS — Z6841 Body Mass Index (BMI) 40.0 and over, adult: Secondary | ICD-10-CM | POA: Diagnosis not present

## 2021-01-06 DIAGNOSIS — S81811D Laceration without foreign body, right lower leg, subsequent encounter: Secondary | ICD-10-CM | POA: Diagnosis not present

## 2021-01-06 DIAGNOSIS — I872 Venous insufficiency (chronic) (peripheral): Secondary | ICD-10-CM | POA: Diagnosis not present

## 2021-01-06 DIAGNOSIS — G4733 Obstructive sleep apnea (adult) (pediatric): Secondary | ICD-10-CM | POA: Diagnosis not present

## 2021-01-06 DIAGNOSIS — G20 Parkinson's disease: Secondary | ICD-10-CM | POA: Diagnosis not present

## 2021-01-06 DIAGNOSIS — L03115 Cellulitis of right lower limb: Secondary | ICD-10-CM | POA: Diagnosis not present

## 2021-01-06 DIAGNOSIS — I890 Lymphedema, not elsewhere classified: Secondary | ICD-10-CM | POA: Diagnosis not present

## 2021-01-06 DIAGNOSIS — N401 Enlarged prostate with lower urinary tract symptoms: Secondary | ICD-10-CM | POA: Diagnosis not present

## 2021-01-06 DIAGNOSIS — R351 Nocturia: Secondary | ICD-10-CM | POA: Diagnosis not present

## 2021-01-06 DIAGNOSIS — R7303 Prediabetes: Secondary | ICD-10-CM | POA: Diagnosis not present

## 2021-01-06 DIAGNOSIS — G4710 Hypersomnia, unspecified: Secondary | ICD-10-CM | POA: Diagnosis not present

## 2021-01-06 DIAGNOSIS — S91111D Laceration without foreign body of right great toe without damage to nail, subsequent encounter: Secondary | ICD-10-CM | POA: Diagnosis not present

## 2021-01-06 DIAGNOSIS — Z87891 Personal history of nicotine dependence: Secondary | ICD-10-CM | POA: Diagnosis not present

## 2021-01-06 DIAGNOSIS — D692 Other nonthrombocytopenic purpura: Secondary | ICD-10-CM | POA: Diagnosis not present

## 2021-01-06 DIAGNOSIS — Z993 Dependence on wheelchair: Secondary | ICD-10-CM | POA: Diagnosis not present

## 2021-01-07 NOTE — Progress Notes (Signed)
Assessment/Plan:   1.  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  Dr. Yong Channel is appealing that.  Pt states that pre-covid he was able to walk but now not able to walk much (just to transfer and even that is hard).  I do think that SNF rehab would help.     2.  History of melanoma  -Follows with dermatology.  Understands that parkinsonism does increase risk for melanoma as well.  Has an upcoming appt  3.  Lymphedema  -Follows with wound care.  4.  Sialorrhea  -This is commonly associated with PD.  We talked about treatments.  The patient is not a candidate for oral anticholinergic therapy because of increased risk of confusion and falls.  We discussed Botox (type A and B) and 1% atropine drops.  We discusssed that candy like lemon drops can help by stimulating mm of the oropharynx to induce swallowing.  Ultimately patient decided to hold on that for now.  Subjective:   Nicholas [name] was seen today in follow up for parkinsonism.  My previous records were reviewed prior to todays visit as well as outside records available to me.  Patient with wife who supplements the history.  Patient does have a caregiver coming into the home 6 days a week (from 3 days a week), 3 hours/day.  He has a lift chair and motorized wheelchair within the home.  He was actually in the emergency room in March because of an accident with the wheelchair.  He accidentally pressed the wrong button on it and caught his leg on the wall and lacerated the leg.  Sutures were placed.  He brings pics of the leg today at the time of the incident.  States that it has healed. He was in the emergency room and admitted not long after that with sepsis due to cellulitis of that leg.  He had a few falls since last visit, primarily sliding out of his chair.  No diplopia.  Having drooling and  listing to the right.  Current prescribed movement disorder medications: Carbidopa/levodopa 25/100, 2 tablets 4 times per day    ALLERGIES:   Allergies  Allergen Reactions   Amoxicillin Swelling    Presumed angioedema of lips due to prednisone    CURRENT MEDICATIONS:  Outpatient Encounter Medications as of 01/11/2021  Medication Sig   albuterol (VENTOLIN HFA) 108 (90 Base) MCG/ACT inhaler Inhale 2 puffs into the lungs every 6 (six) hours as needed for wheezing or shortness of breath.   carbidopa-levodopa (SINEMET IR) 25-100 MG tablet TAKE 2 TABLETS AT 7AM,11AM, 3PM, AND AT 7PM.   GEMTESA 75 MG TABS Take 1 tablet by mouth daily.   rosuvastatin (CRESTOR) 10 MG tablet TAKE 1 TABLET ONCE DAILY.   No facility-administered encounter medications on file as of 01/11/2021.    Objective:   PHYSICAL EXAMINATION:    VITALS:   Vitals:   01/11/21 0931  BP: 124/84  Pulse: 79  SpO2: 99%  Weight: 250 lb (113.4 kg)  Height: 5\' 7"  (1.702 m)     GEN:  The patient appears stated age and is in NAD. HEENT:  Normocephalic, atraumatic.  The mucous membranes are moist. The superficial temporal arteries are without ropiness or tenderness. CV:  RRR Lungs:  CTAB Neck/HEME:  There are no carotid bruits bilaterally.  Neurological examination:  Orientation:  The patient is alert and oriented x3. Cranial nerves: There is good facial symmetry with facial hypomimia. EOMI with the exception of upgaze paresis.  There is square wave jerks.  The speech is fluent and clear but occ has sig hyphonia and then returns to normal. Soft palate rises symmetrically and there is no tongue deviation. Hearing is intact to conversational tone. Sensation: Sensation is intact to light touch throughout Motor: Strength is at least antigravity x4.  Movement examination: Tone: There is mild increased tone in the [place] (same as previous) Abnormal movements: none Coordination:  There is no  decremation with RAM's, with hand  opening and closing and finger taps bilaterally Gait and Station: Not tested today due to instability/wheelchair-bound.  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  I have reviewed and interpreted the following labs independently    Chemistry      Component Value Date/Time   NA 137 11/08/2020 0534   K 4.3 11/08/2020 0534   CL 105 11/08/2020 0534   CO2 24 11/08/2020 0534   BUN 17 11/08/2020 0534   CREATININE 0.78 11/08/2020 0534   CREATININE 1.01 10/03/2019 1517      Component Value Date/Time   CALCIUM 8.4 (L) 11/08/2020 0534   ALKPHOS 51 11/05/2020 0540   AST 19 11/05/2020 0540   ALT 7 11/05/2020 0540   BILITOT 0.7 11/05/2020 0540       Lab Results  Component Value Date   WBC 6.7 11/08/2020   HGB 12.0 (L) 11/08/2020   HCT 37.5 (L) 11/08/2020   MCV 93.5 11/08/2020   PLT 183 11/08/2020    Lab Results  Component Value Date   TSH 1.42 06/19/2018     Total time spent on today's visit was 20 minutes, including both face-to-face time and nonface-to-face time.  Time included that spent on review of records (prior notes available to me/labs/imaging if pertinent), discussing treatment and goals, answering patient's questions and coordinating care.  Cc:  [name], MD

## 2021-01-11 ENCOUNTER — Encounter: Payer: Self-pay | Admitting: Neurology

## 2021-01-11 ENCOUNTER — Ambulatory Visit: Payer: PPO | Admitting: Neurology

## 2021-01-11 ENCOUNTER — Other Ambulatory Visit: Payer: Self-pay

## 2021-01-11 VITALS — BP 124/84 | HR 79 | Ht 67.0 in | Wt 250.0 lb

## 2021-01-11 DIAGNOSIS — F444 Conversion disorder with motor symptom or deficit: Secondary | ICD-10-CM

## 2021-01-11 DIAGNOSIS — K117 Disturbances of salivary secretion: Secondary | ICD-10-CM | POA: Diagnosis not present

## 2021-01-11 DIAGNOSIS — G20A1 Parkinson's disease without dyskinesia, without mention of fluctuations: Secondary | ICD-10-CM | POA: Insufficient documentation

## 2021-01-11 DIAGNOSIS — G20 Parkinson's disease: Secondary | ICD-10-CM | POA: Diagnosis not present

## 2021-01-11 MED ORDER — CARBIDOPA-LEVODOPA 25-100 MG PO TABS: ORAL_TABLET | ORAL | 1 refills | Status: DC

## 2021-01-11 NOTE — Patient Instructions (Signed)
Let me know if you want to do the botox and we can schedule that.    The physicians and staff at [facility] are committed to providing excellent care. You may receive a survey requesting feedback about your experience at our office. We strive to receive "very good" responses to the survey questions. If you feel that your experience would prevent you from giving the office a "very good " response, please contact our office to try to remedy the situation. We may be reached at [phone]. Thank you for taking the time out of your busy day to complete the survey.

## 2021-01-13 ENCOUNTER — Encounter: Payer: Self-pay | Admitting: Family Medicine

## 2021-01-13 ENCOUNTER — Ambulatory Visit (INDEPENDENT_AMBULATORY_CARE_PROVIDER_SITE_OTHER): Payer: PPO | Admitting: Family Medicine

## 2021-01-13 ENCOUNTER — Other Ambulatory Visit: Payer: Self-pay

## 2021-01-13 VITALS — BP 116/72 | HR 96 | Temp 98.1°F | Ht 66.0 in

## 2021-01-13 DIAGNOSIS — R531 Weakness: Secondary | ICD-10-CM | POA: Diagnosis not present

## 2021-01-13 DIAGNOSIS — Z7409 Other reduced mobility: Secondary | ICD-10-CM | POA: Diagnosis not present

## 2021-01-13 DIAGNOSIS — I10 Essential (primary) hypertension: Secondary | ICD-10-CM | POA: Diagnosis not present

## 2021-01-13 DIAGNOSIS — E785 Hyperlipidemia, unspecified: Secondary | ICD-10-CM

## 2021-01-13 DIAGNOSIS — G232 Striatonigral degeneration: Secondary | ICD-10-CM | POA: Diagnosis not present

## 2021-01-13 DIAGNOSIS — I890 Lymphedema, not elsewhere classified: Secondary | ICD-10-CM

## 2021-01-13 NOTE — Patient Instructions (Addendum)
Health Maintenance Due  Topic Date Due   Zoster Vaccines- Shingrix (1 of 2)   Please check with your pharmacy to see if they have the shingrix vaccine. If they do- please get this immunization and update [place] by phone call or mychart with dates you receive the vaccine  Never done   COVID-19 Vaccine (4 - Booster for [employer] series) Will schedule in the fall.  10/30/2020   [facility]  was disccused.  I am going to call to advocate for SNF visit for at least 2 weeks to see if we can help you make progress    Recommended follow up: Return in about 3 months (around 04/15/2021).

## 2021-01-13 NOTE — Progress Notes (Addendum)
Phone [phone] In person visit   Subjective:   Nicholas [name] is a 75 y.o. year old very pleasant male patient who presents for/with See problem oriented charting Chief Complaint  Patient presents with   Skilled Nursing     Discussion    This visit occurred during the SARS-CoV-2 public health emergency.  Safety protocols were in place, including screening questions prior to the visit, additional usage of staff PPE, and extensive cleaning of exam room while observing appropriate contact time as indicated for disinfecting solutions.   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 left leg 03/09/2009    Priority: High   BPH associated with nocturia 05/16/2016    Priority: Medium   OSA (obstructive sleep apnea) 12/28/2015    Priority: Medium   Diastolic dysfunction 57/32/2025    Priority: Medium   Chronic venous insufficiency 02/20/2014    Priority: Medium   Hyperglycemia 08/02/2012    Priority: Medium   Hyperlipidemia 12/13/2006    Priority: Medium   Essential hypertension 12/13/2006    Priority: Medium   Senile purpura ([place]) 05/03/2016    Priority: Low   Hypersomnia 11/15/2015    Priority: Low   Former smoker 04/29/2014    Priority: Low   Morbid obesity ([place]) 07/13/2010    Priority: Low   Insomnia 10/03/2007    Priority: Low   Parkinson's disease ([place]) 01/11/2021   Sepsis without acute organ dysfunction ([place])    Cellulitis 04/12/2020   Strain of right biceps 09/12/2017   Cough 11/15/2015   Dizziness 12/30/2014    Medications- reviewed and updated Current Outpatient Medications  Medication Sig Dispense Refill   albuterol (VENTOLIN HFA) 108 (90 Base) MCG/ACT inhaler Inhale 2 puffs into the lungs every 6 (six) hours as needed for wheezing or shortness of breath. 18 g 1   carbidopa-levodopa  (SINEMET IR) 25-100 MG tablet TAKE 2 TABLETS AT 7AM,11AM, 3PM, AND AT 7PM. 720 tablet 1   GEMTESA 75 MG TABS Take 1 tablet by mouth daily.     rosuvastatin (CRESTOR) 10 MG tablet TAKE 1 TABLET ONCE DAILY. 90 tablet 0   No current facility-administered medications for this visit.     Objective:  BP 116/72   Pulse 96   Temp 98.1 F (36.7 C) (Temporal)   Ht 5\' 6"  (1.676 m)   SpO2 97%   BMI 40.35 kg/m  Gen: NAD, resting comfortably CV: RRR no murmurs rubs or gallops Lungs: CTAB no crackles, wheeze, rhonchi Abdomen: soft/nontender/nondistended/normal bowel sounds. No rebound or guarding.  Ext: profound lymphedema bilaterally Skin: warm, dry Neuro: Wheelchair-bound.  Even with my attempt an effort to help pull patient up in chair very difficult-leaning towards right side at baseline.- took extremely heavy assist to help patient stand to reposition in his wheelchair    Assessment and Plan    # profound lymphedema/worsening weakness generalized after laceration on leg in late march 2022 S: Patient with profound lymphedema-previously went to the lymphedema clinic on [facility] but is no longer physically able to get there once or twice a week-the burn is simply too heavy between his decreased mobility and his wife's abilities to get him into the clinic at that frequency.  It was extremely challenging today to get patient into the clinic due to decreased mobility.  He is no  longer able to stand on his own. 2 Prior to cutting his leg late April he was able to stand on his own and even walk to the bathroom with walker. He has not been able to do so since the lacerations occurred. With less mobility, lymphedema has worsened which has been another barrier for his mobility.  - Dr. Carles Collet his neurologist concurs that SNF would be helpful to regain strength  Wife was given  some instructions for home care for lymphedema but this has been beyond her Physical abilities bending over to apply these. She  does not have strength for the massage. She has instructions but unable to complete these.   Patient was denied by insurance SNF for rehab. They have a caregiver coming Monday through Saturday for 3 hours each morning- they get him ready for the day bathing, dressing, breakfast so wife can do things she needs to do and then be able to help the rest of day. He now needs assist even for going ot the bathroom since can no longer walk  Has motorized wheelchair - does not have set up for this to get into vehicle yet- family is looking at getting into a [facility]  PT not currently coming ot home  A/P: With profound lymphedema and worsening generalized weakness/decreased mobility as a result of setback from laceration to the back of his right leg- I disagree with insurances opinion that patient does not have skilled nursing needs.  Patient would benefit from physical therapy and Occupational Therapy to improve his strength and help with transfers.  He would also benefit from skilled nursing to apply lymphedema wraps on a regular basis.  If his lymphedema improves then his mobility could potentially improve as well.  The family is not currently equipped to get him to the lymphedema clinic on a regular basis or apply needed treatment at home.  We are going to reach out to [facility] to advocate for the patient.   #Parkinson's plus syndrome-followed by Dr. Carles Collet of [place] neurology S: Medication: Carbidopa levodopa 25-100 mg-patient takes 2 tablets at 7 AM, 11 AM, 3 PM and 7 PM  Patient has caregiver Monday Wednesday Friday previously now up to 6 days a week.   Patient also has some incontinence issues- he is on gemtesa now A/P: worsening weakness after recent set back- PT/OT on regular basis to improve strenght  #hypertension S: medication: None at this point-has been taken off medication BP Readings from Last 3 Encounters:  01/13/21 116/72  01/11/21 124/84  11/25/20 136/76  A/P: controlled  without meds- continue current meds  #hyperlipidemia S: Medication: Crestor 10 mg daily Lab Results  Component Value Date   CHOL 105 10/03/2019   HDL 38 (L) 10/03/2019   LDLCALC 45 10/03/2019   LDLDIRECT 50.0 05/03/2016   TRIG 135 10/03/2019   CHOLHDL 2.8 10/03/2019  A/P: Stable on last check. Continue current medications.   Recommended follow up: Return in about 3 months (around 04/15/2021). Future Appointments  Date Time Provider [place]  05/16/2021  2:30 PM [facility] HEALTH COACH [facility]  07/26/2021  3:00 PM [name], [name], DO [facility] None    Lab/Order associations:   ICD-10-CM   1. Generalized weakness  R53.1     2. Mobility impaired  Z74.09     3. Lymphedema  I89.0     4. 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 Hyperlipidemia, unspecified hyperlipidemia type  E78.5     6. Essential  hypertension  I10      I,Harris Phan,acting as a scribe for [name], MD.,have documented all relevant documentation on the behalf of [name], MD,as directed by  [name], MD while in the presence of [name], MD.   I, [name], MD, have reviewed all documentation for this visit. The documentation on 01/13/21 for the exam, diagnosis, procedures, and orders are all accurate and complete.   Return precautions advised.  [name], MD

## 2021-01-14 DIAGNOSIS — N3941 Urge incontinence: Secondary | ICD-10-CM | POA: Diagnosis not present

## 2021-01-14 DIAGNOSIS — R351 Nocturia: Secondary | ICD-10-CM | POA: Diagnosis not present

## 2021-01-14 DIAGNOSIS — R350 Frequency of micturition: Secondary | ICD-10-CM | POA: Diagnosis not present

## 2021-02-02 ENCOUNTER — Telehealth: Payer: Self-pay

## 2021-02-02 NOTE — Telephone Encounter (Signed)
Patient is needing a RX for a Lift chair for [employer] discount  please call Nira Conn at [employer] if you have any questions or patients wife ( pauletta Repsher )  [phone]

## 2021-02-02 NOTE — Telephone Encounter (Signed)
Can you handle and fax this please?

## 2021-02-03 DIAGNOSIS — I890 Lymphedema, not elsewhere classified: Secondary | ICD-10-CM | POA: Diagnosis not present

## 2021-02-03 DIAGNOSIS — G232 Striatonigral degeneration: Secondary | ICD-10-CM | POA: Diagnosis not present

## 2021-02-03 DIAGNOSIS — S46211A Strain of muscle, fascia and tendon of other parts of biceps, right arm, initial encounter: Secondary | ICD-10-CM | POA: Diagnosis not present

## 2021-02-03 DIAGNOSIS — R42 Dizziness and giddiness: Secondary | ICD-10-CM | POA: Diagnosis not present

## 2021-02-10 NOTE — Telephone Encounter (Signed)
Patient already has his lift chair.

## 2021-02-11 ENCOUNTER — Telehealth: Payer: Self-pay

## 2021-02-11 NOTE — Telephone Encounter (Signed)
Patient called stating he was not able to gain access to a skilled nursing facility.   Is now requesting a referral to [facility] for lymphedema management.  Patients last OV was 01/13/21.   Please advise if patient needs appointment scheduled.

## 2021-02-11 NOTE — Telephone Encounter (Signed)
Can I place this referral since we just seen him ?

## 2021-02-13 NOTE — Telephone Encounter (Signed)
May refer under lymphedema

## 2021-02-15 ENCOUNTER — Other Ambulatory Visit: Payer: Self-pay

## 2021-02-15 DIAGNOSIS — I890 Lymphedema, not elsewhere classified: Secondary | ICD-10-CM

## 2021-02-15 NOTE — Telephone Encounter (Signed)
Patient is calling in requesting an update.

## 2021-02-15 NOTE — Telephone Encounter (Signed)
Referral has been placed, patient is aware and gave a verbal understanding.

## 2021-02-24 ENCOUNTER — Ambulatory Visit: Payer: PPO

## 2021-02-24 ENCOUNTER — Telehealth: Payer: Self-pay | Admitting: Physical Therapy

## 2021-02-24 NOTE — Telephone Encounter (Signed)
Phoned pt this morning after speaking with physical therapist he was scheduled to see. PT was concerned about the pt's ability to follow through with lymphedema treatment at home, ability to come into the clinic, and compliance. Spoke with pt as he is known to [place] from previous treatment. He agreed that he does not have someone at home willing to do compression bandaging and that he has significant difficulty leaving his house - and that his function has declined since he was last treated with [place]. I told him I would cancel his appointment, contact his physician, and research other options such as a home health lymphedema therapist. His wife called back asking to speak to me. I phoned her back and after getting permission from Mr. Flatley, spoke to her briefly but she requested I call back at 2pm. Phoned pt's wife back at 2pm and had a lengthy conversation about concerns with him doing outpatient PT and other options. She was very tearful on the phone explaining that she can't care for him anymore. We talked briefly about the possibility of getting additional care for him, a rehab hospital, or a nursing home. She agreed that she could not provide follow through with lymphedema treatment and that having him do outpatient PT again was probably not the best option. She said she is seeing her PCP tomorrow (which is the same PCP as her husband's) and she will talk with him about other possible options.  I told her I would continue looking into home health lymphedema treatment and we would regroup on 02/28/2021. Annia Friendly, [place] 02/24/21 2:43 PM

## 2021-03-02 ENCOUNTER — Ambulatory Visit: Payer: PPO | Admitting: Physician Assistant

## 2021-03-02 ENCOUNTER — Telehealth: Payer: Self-pay

## 2021-03-02 DIAGNOSIS — Z20828 Contact with and (suspected) exposure to other viral communicable diseases: Secondary | ICD-10-CM | POA: Diagnosis not present

## 2021-03-02 NOTE — Telephone Encounter (Signed)
Manuela Schwartz called in because Mr. Rozzi Right leg wound is opening back up and she is very concerned about the wound becoming infected. He is living in a senior living facility and his wife is not able to do the compressions that he needs. One calf is 21 inches around and the other calf is 23 inches around. The wound on his wife leg is 4 fingers in length and 3 fingers in width. So she's requesting him to go to a "sniff" to have his wound treated correctly. The wife is struggling to get him to outpatient therapy.

## 2021-03-03 DIAGNOSIS — I872 Venous insufficiency (chronic) (peripheral): Secondary | ICD-10-CM | POA: Diagnosis not present

## 2021-03-03 DIAGNOSIS — I890 Lymphedema, not elsewhere classified: Secondary | ICD-10-CM | POA: Diagnosis not present

## 2021-03-03 DIAGNOSIS — G20 Parkinson's disease: Secondary | ICD-10-CM | POA: Diagnosis not present

## 2021-03-03 DIAGNOSIS — E785 Hyperlipidemia, unspecified: Secondary | ICD-10-CM | POA: Diagnosis not present

## 2021-03-03 DIAGNOSIS — I5189 Other ill-defined heart diseases: Secondary | ICD-10-CM | POA: Diagnosis not present

## 2021-03-03 DIAGNOSIS — I10 Essential (primary) hypertension: Secondary | ICD-10-CM | POA: Diagnosis not present

## 2021-03-03 DIAGNOSIS — N3281 Overactive bladder: Secondary | ICD-10-CM | POA: Diagnosis not present

## 2021-03-03 DIAGNOSIS — G4733 Obstructive sleep apnea (adult) (pediatric): Secondary | ICD-10-CM | POA: Diagnosis not present

## 2021-03-06 DIAGNOSIS — S46211A Strain of muscle, fascia and tendon of other parts of biceps, right arm, initial encounter: Secondary | ICD-10-CM | POA: Diagnosis not present

## 2021-03-06 DIAGNOSIS — I890 Lymphedema, not elsewhere classified: Secondary | ICD-10-CM | POA: Diagnosis not present

## 2021-03-06 DIAGNOSIS — R42 Dizziness and giddiness: Secondary | ICD-10-CM | POA: Diagnosis not present

## 2021-03-06 DIAGNOSIS — G232 Striatonigral degeneration: Secondary | ICD-10-CM | POA: Diagnosis not present

## 2021-03-07 ENCOUNTER — Ambulatory Visit: Payer: PPO | Admitting: Physician Assistant

## 2021-03-07 NOTE — Telephone Encounter (Signed)
Lasandra calling from [facility] wanted to speak to a CMA for Dr. Yong Channel. Call back number is [phone].

## 2021-03-08 ENCOUNTER — Ambulatory Visit: Payer: PPO | Admitting: Physician Assistant

## 2021-03-08 NOTE — Telephone Encounter (Signed)
Patient is scheduled on 8/31

## 2021-03-08 NOTE — Telephone Encounter (Signed)
Nicholas [name] returned my call to make [place] aware that a NP from [facility] went out to see pt last week and they are willing to approve inpatient skilled therapy for pt and after speaking with pt wife they are wanting to move to pt to a long term care facility in the future and they still wish to have this done at  [name]. They are needing an updated FL2 from [facility] and once they have received the updated FL2 faxed to [phone] then they can/will approve pt for these services.   Jazz once the [facility] has been completed can you fax it to the number above please?

## 2021-03-08 NOTE — Telephone Encounter (Signed)
Can you please schedule pt for OV for Fl2 form completion soon.

## 2021-03-08 NOTE — Telephone Encounter (Signed)
Called and lm for [employer].

## 2021-03-09 DIAGNOSIS — Z20828 Contact with and (suspected) exposure to other viral communicable diseases: Secondary | ICD-10-CM | POA: Diagnosis not present

## 2021-03-09 NOTE — Telephone Encounter (Signed)
I filled out FL 2-patient can cancel visit on 8/31-I gave this to [facility] submit this

## 2021-03-10 NOTE — Telephone Encounter (Signed)
Pt appointment has been canceled

## 2021-03-11 DIAGNOSIS — I872 Venous insufficiency (chronic) (peripheral): Secondary | ICD-10-CM | POA: Diagnosis not present

## 2021-03-11 DIAGNOSIS — I890 Lymphedema, not elsewhere classified: Secondary | ICD-10-CM | POA: Diagnosis not present

## 2021-03-11 DIAGNOSIS — N3281 Overactive bladder: Secondary | ICD-10-CM | POA: Diagnosis not present

## 2021-03-11 DIAGNOSIS — G20 Parkinson's disease: Secondary | ICD-10-CM | POA: Diagnosis not present

## 2021-03-11 NOTE — Telephone Encounter (Signed)
FL2 form has been faxed!

## 2021-03-14 ENCOUNTER — Telehealth: Payer: Self-pay

## 2021-03-14 NOTE — Telephone Encounter (Signed)
Pt called stating that [facility] is able to do at home lymphedema wraps. Pt stated that they need a prescription written by Dr Yong Channel so that they can start doing the wraps. He stated to call [phone] with any questions for their office. Please Advise.

## 2021-03-14 NOTE — Telephone Encounter (Signed)
Can you write what pt is needing on a Rx pad and have Dr. Yong Channel sign it and fax it to the company in the below message please?

## 2021-03-16 DIAGNOSIS — Z20828 Contact with and (suspected) exposure to other viral communicable diseases: Secondary | ICD-10-CM | POA: Diagnosis not present

## 2021-03-17 NOTE — Telephone Encounter (Signed)
Script has been RE WRITTEN with the correct DX code on it and successfully faxed back.

## 2021-03-21 ENCOUNTER — Telehealth: Payer: Self-pay

## 2021-03-21 NOTE — Telephone Encounter (Signed)
They need most recent OV note faxed to [phone] made to ATTN: Debby Freiberg can you fax this please?

## 2021-03-21 NOTE — Telephone Encounter (Signed)
Spoke with Ray, Successfully faxed over Mr. Rudell Most recent Office Visit

## 2021-03-21 NOTE — Telephone Encounter (Signed)
Ray Banks from [facility] called to get some information and orders before she sees [employer]. She would like a call back at [phone]. Please Advise.

## 2021-03-23 ENCOUNTER — Other Ambulatory Visit: Payer: Self-pay | Admitting: Family Medicine

## 2021-03-23 ENCOUNTER — Ambulatory Visit: Payer: PPO | Admitting: Family Medicine

## 2021-03-23 DIAGNOSIS — Z20828 Contact with and (suspected) exposure to other viral communicable diseases: Secondary | ICD-10-CM | POA: Diagnosis not present

## 2021-03-24 ENCOUNTER — Telehealth: Payer: Self-pay | Admitting: *Deleted

## 2021-03-24 ENCOUNTER — Telehealth: Payer: Self-pay

## 2021-03-24 DIAGNOSIS — F0390 Unspecified dementia without behavioral disturbance: Secondary | ICD-10-CM | POA: Diagnosis not present

## 2021-03-24 DIAGNOSIS — G232 Striatonigral degeneration: Secondary | ICD-10-CM | POA: Diagnosis not present

## 2021-03-24 DIAGNOSIS — R059 Cough, unspecified: Secondary | ICD-10-CM | POA: Diagnosis not present

## 2021-03-24 DIAGNOSIS — G20 Parkinson's disease: Secondary | ICD-10-CM | POA: Diagnosis not present

## 2021-03-24 DIAGNOSIS — J989 Respiratory disorder, unspecified: Secondary | ICD-10-CM | POA: Diagnosis not present

## 2021-03-24 DIAGNOSIS — Z741 Need for assistance with personal care: Secondary | ICD-10-CM | POA: Diagnosis not present

## 2021-03-24 DIAGNOSIS — I10 Essential (primary) hypertension: Secondary | ICD-10-CM | POA: Diagnosis not present

## 2021-03-24 DIAGNOSIS — R41841 Cognitive communication deficit: Secondary | ICD-10-CM | POA: Diagnosis not present

## 2021-03-24 DIAGNOSIS — I5032 Chronic diastolic (congestive) heart failure: Secondary | ICD-10-CM | POA: Diagnosis not present

## 2021-03-24 DIAGNOSIS — D692 Other nonthrombocytopenic purpura: Secondary | ICD-10-CM | POA: Diagnosis not present

## 2021-03-24 DIAGNOSIS — R269 Unspecified abnormalities of gait and mobility: Secondary | ICD-10-CM | POA: Diagnosis not present

## 2021-03-24 DIAGNOSIS — M6281 Muscle weakness (generalized): Secondary | ICD-10-CM | POA: Diagnosis not present

## 2021-03-24 DIAGNOSIS — S46211A Strain of muscle, fascia and tendon of other parts of biceps, right arm, initial encounter: Secondary | ICD-10-CM | POA: Diagnosis not present

## 2021-03-24 DIAGNOSIS — E785 Hyperlipidemia, unspecified: Secondary | ICD-10-CM | POA: Diagnosis not present

## 2021-03-24 DIAGNOSIS — L97219 Non-pressure chronic ulcer of right calf with unspecified severity: Secondary | ICD-10-CM | POA: Diagnosis not present

## 2021-03-24 DIAGNOSIS — R262 Difficulty in walking, not elsewhere classified: Secondary | ICD-10-CM | POA: Diagnosis not present

## 2021-03-24 DIAGNOSIS — I890 Lymphedema, not elsewhere classified: Secondary | ICD-10-CM | POA: Diagnosis not present

## 2021-03-24 DIAGNOSIS — R42 Dizziness and giddiness: Secondary | ICD-10-CM | POA: Diagnosis not present

## 2021-03-24 DIAGNOSIS — R29898 Other symptoms and signs involving the musculoskeletal system: Secondary | ICD-10-CM | POA: Diagnosis not present

## 2021-03-24 DIAGNOSIS — I872 Venous insufficiency (chronic) (peripheral): Secondary | ICD-10-CM | POA: Diagnosis not present

## 2021-03-24 DIAGNOSIS — R2681 Unsteadiness on feet: Secondary | ICD-10-CM | POA: Diagnosis not present

## 2021-03-24 DIAGNOSIS — N3281 Overactive bladder: Secondary | ICD-10-CM | POA: Diagnosis not present

## 2021-03-24 DIAGNOSIS — M1990 Unspecified osteoarthritis, unspecified site: Secondary | ICD-10-CM | POA: Diagnosis not present

## 2021-03-24 NOTE — Telephone Encounter (Signed)
Patient wife is calling in stating that there is a form that needs to be signed by Dr.Hunter in order to be admitted into a home. Advised that we received the forms yesterday after Dr.Hunter was gone for the day, and that it is in the folder waiting for a signature from [place]. Nevin Bloodgood said that the forms need to be completed today or else he will not be able to admitted.

## 2021-03-24 NOTE — Telephone Encounter (Signed)
Form faxed today

## 2021-03-24 NOTE — Telephone Encounter (Signed)
Placed form on Stella's desk- was not available for me to sign prior to my departure yesterday

## 2021-03-24 NOTE — Telephone Encounter (Signed)
Nicholas [name] form Archivist [phone] Call to informed patient PCP  Patient been accepted to nurse facility [name]  Patient will be admitted today or tomorrow

## 2021-03-24 NOTE — Telephone Encounter (Signed)
Form faxed  Cephus Richer notified

## 2021-03-25 ENCOUNTER — Telehealth: Payer: Self-pay

## 2021-03-25 DIAGNOSIS — R269 Unspecified abnormalities of gait and mobility: Secondary | ICD-10-CM | POA: Diagnosis not present

## 2021-03-25 DIAGNOSIS — F0390 Unspecified dementia without behavioral disturbance: Secondary | ICD-10-CM | POA: Diagnosis not present

## 2021-03-25 DIAGNOSIS — I890 Lymphedema, not elsewhere classified: Secondary | ICD-10-CM | POA: Diagnosis not present

## 2021-03-25 DIAGNOSIS — G20 Parkinson's disease: Secondary | ICD-10-CM | POA: Diagnosis not present

## 2021-03-25 NOTE — Telephone Encounter (Signed)
Is he at [facility] now? I would assume since being treated for lymphedema that needs to be rewrapped- can the lymphedema nurse weigh in?

## 2021-03-25 NOTE — Telephone Encounter (Signed)
LM for Deanne tcb.

## 2021-03-25 NOTE — Telephone Encounter (Signed)
See below

## 2021-03-25 NOTE — Telephone Encounter (Addendum)
Received a call from [place], patient had an accident and the bandages on his legs ended up getting wet. They had to take bandages off, and need to know if they are okay to be open or need to dressed. Requesting that we fax the information to [place] [phone].

## 2021-03-29 DIAGNOSIS — G20 Parkinson's disease: Secondary | ICD-10-CM | POA: Diagnosis not present

## 2021-03-29 DIAGNOSIS — N3281 Overactive bladder: Secondary | ICD-10-CM | POA: Diagnosis not present

## 2021-03-29 DIAGNOSIS — I10 Essential (primary) hypertension: Secondary | ICD-10-CM | POA: Diagnosis not present

## 2021-03-29 DIAGNOSIS — M1990 Unspecified osteoarthritis, unspecified site: Secondary | ICD-10-CM | POA: Diagnosis not present

## 2021-03-29 DIAGNOSIS — I5032 Chronic diastolic (congestive) heart failure: Secondary | ICD-10-CM | POA: Diagnosis not present

## 2021-03-29 DIAGNOSIS — R29898 Other symptoms and signs involving the musculoskeletal system: Secondary | ICD-10-CM | POA: Diagnosis not present

## 2021-03-29 DIAGNOSIS — E785 Hyperlipidemia, unspecified: Secondary | ICD-10-CM | POA: Diagnosis not present

## 2021-03-29 DIAGNOSIS — I890 Lymphedema, not elsewhere classified: Secondary | ICD-10-CM | POA: Diagnosis not present

## 2021-03-29 DIAGNOSIS — J989 Respiratory disorder, unspecified: Secondary | ICD-10-CM | POA: Diagnosis not present

## 2021-03-29 DIAGNOSIS — D692 Other nonthrombocytopenic purpura: Secondary | ICD-10-CM | POA: Diagnosis not present

## 2021-03-29 DIAGNOSIS — I872 Venous insufficiency (chronic) (peripheral): Secondary | ICD-10-CM | POA: Diagnosis not present

## 2021-03-31 DIAGNOSIS — L97219 Non-pressure chronic ulcer of right calf with unspecified severity: Secondary | ICD-10-CM | POA: Diagnosis not present

## 2021-04-01 DIAGNOSIS — R059 Cough, unspecified: Secondary | ICD-10-CM | POA: Diagnosis not present

## 2021-04-06 DIAGNOSIS — G20 Parkinson's disease: Secondary | ICD-10-CM | POA: Diagnosis not present

## 2021-04-06 DIAGNOSIS — S46211A Strain of muscle, fascia and tendon of other parts of biceps, right arm, initial encounter: Secondary | ICD-10-CM | POA: Diagnosis not present

## 2021-04-06 DIAGNOSIS — G232 Striatonigral degeneration: Secondary | ICD-10-CM | POA: Diagnosis not present

## 2021-04-06 DIAGNOSIS — I890 Lymphedema, not elsewhere classified: Secondary | ICD-10-CM | POA: Diagnosis not present

## 2021-04-06 DIAGNOSIS — I5032 Chronic diastolic (congestive) heart failure: Secondary | ICD-10-CM | POA: Diagnosis not present

## 2021-04-06 DIAGNOSIS — R42 Dizziness and giddiness: Secondary | ICD-10-CM | POA: Diagnosis not present

## 2021-04-06 DIAGNOSIS — J989 Respiratory disorder, unspecified: Secondary | ICD-10-CM | POA: Diagnosis not present

## 2021-04-07 DIAGNOSIS — L97219 Non-pressure chronic ulcer of right calf with unspecified severity: Secondary | ICD-10-CM | POA: Diagnosis not present

## 2021-04-12 DIAGNOSIS — L97219 Non-pressure chronic ulcer of right calf with unspecified severity: Secondary | ICD-10-CM | POA: Diagnosis not present

## 2021-04-15 ENCOUNTER — Ambulatory Visit: Payer: PPO | Admitting: Family Medicine

## 2021-04-15 DIAGNOSIS — I10 Essential (primary) hypertension: Secondary | ICD-10-CM | POA: Diagnosis not present

## 2021-04-15 DIAGNOSIS — I872 Venous insufficiency (chronic) (peripheral): Secondary | ICD-10-CM | POA: Diagnosis not present

## 2021-04-15 DIAGNOSIS — I5032 Chronic diastolic (congestive) heart failure: Secondary | ICD-10-CM | POA: Diagnosis not present

## 2021-04-15 DIAGNOSIS — I890 Lymphedema, not elsewhere classified: Secondary | ICD-10-CM | POA: Diagnosis not present

## 2021-04-20 DIAGNOSIS — Z20828 Contact with and (suspected) exposure to other viral communicable diseases: Secondary | ICD-10-CM | POA: Diagnosis not present

## 2021-04-27 DIAGNOSIS — Z8616 Personal history of COVID-19: Secondary | ICD-10-CM | POA: Diagnosis not present

## 2021-04-28 ENCOUNTER — Ambulatory Visit: Payer: PPO | Admitting: Family Medicine

## 2021-05-04 DIAGNOSIS — Z20828 Contact with and (suspected) exposure to other viral communicable diseases: Secondary | ICD-10-CM | POA: Diagnosis not present

## 2021-05-06 DIAGNOSIS — R42 Dizziness and giddiness: Secondary | ICD-10-CM | POA: Diagnosis not present

## 2021-05-06 DIAGNOSIS — S46211A Strain of muscle, fascia and tendon of other parts of biceps, right arm, initial encounter: Secondary | ICD-10-CM | POA: Diagnosis not present

## 2021-05-06 DIAGNOSIS — G232 Striatonigral degeneration: Secondary | ICD-10-CM | POA: Diagnosis not present

## 2021-05-06 DIAGNOSIS — I890 Lymphedema, not elsewhere classified: Secondary | ICD-10-CM | POA: Diagnosis not present

## 2021-05-06 NOTE — Progress Notes (Incomplete)
Phone [phone] In person visit   Subjective:   Nicholas [name] is a 75 y.o. year old very pleasant male patient who presents for/with See problem oriented charting No chief complaint on file.   This visit occurred during the SARS-CoV-2 public health emergency.  Safety protocols were in place, including screening questions prior to the visit, additional usage of staff PPE, and extensive cleaning of exam room while observing appropriate contact time as indicated for disinfecting solutions.   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 left leg 03/09/2009   Insomnia 10/03/2007   Hyperlipidemia 12/13/2006   Essential hypertension 12/13/2006    Medications- reviewed and updated Current Outpatient Medications  Medication Sig Dispense Refill   albuterol (VENTOLIN HFA) 108 (90 Base) MCG/ACT inhaler Inhale 2 puffs into the lungs every 6 (six) hours as needed for wheezing or shortness of breath. 18 g 1   carbidopa-levodopa (SINEMET IR) 25-100 MG tablet TAKE 2 TABLETS AT 7AM,11AM, 3PM, AND AT 7PM. 720 tablet 1   GEMTESA 75 MG TABS Take 1 tablet by mouth daily.     rosuvastatin (CRESTOR) 10 MG tablet TAKE ONE TABLET BY MOUTH DAILY 90 tablet 0   No current facility-administered medications for this visit.     Objective:  There were no vitals taken for  this visit. Gen: NAD, resting comfortably CV: RRR no murmurs rubs or gallops Lungs: CTAB no crackles, wheeze, rhonchi Abdomen: soft/nontender/nondistended/normal bowel sounds. No rebound or guarding.  Ext: no edema Skin: warm, dry Neuro: grossly normal, moves all extremities  ***    Assessment and Plan   # profound lymphedema/worsened weakness generalized after laceration on leg in late march 2022 S: Patient with profound lymphedema-previously went to the lymphedema clinic on [facility] but was no longer physically able to get there once or twice a week-the burn is simply too heavy between his decreased mobility and his wife's abilities to get him into the clinic at that frequency.  It was extremely challenging to get patient into the clinic due to decreased mobility.  He was no longer able to stand on his own. 2 Prior to cutting his leg late April he was able to stand on his own and even walk to the bathroom with walker. He had not been able to do so since the lacerations occurred. With less mobility, lymphedema had worsened which had been another barrier for his mobility.  - Dr. Carles Collet his neurologist concurred that SNF would be helpful to regain strength   Wife was given some instructions for home care for lymphedema but this has been beyond her Physical abilities bending over to apply these. She does not have strength for the massage. She had instructions but unable to complete these.    Patient was denied by insurance SNF for rehab. They have a caregiver coming Monday through Saturday for 3 hours each morning- they  get him ready for the day bathing, dressing, breakfast so wife can do things she needed to do and then be able to help the rest of day. He needed assist for going ot the bathroom since can no longer walk   Had motorized wheelchair - does not have set up for this to get into vehicle yet- family was looking at getting into a [facility]   PT not currently coming of home   A/P: ***      #Parkinson's plus syndrome-followed by Dr. Carles Collet of [place] neurology S: Medication: Carbidopa levodopa 25-100 mg-patient takes 2 tablets at 7 AM, 11 AM, 3 PM and 7 PM  Patient had caregiver Monday Wednesday Friday previously but recently switched to 6 days a week.   Patient also had some incontinence issues- he was on gemtesa  A/P: ***  #hypertension S: medication: None.Had been taken off medication Home readings #s: *** BP Readings from Last 3 Encounters:  01/13/21 116/72  01/11/21 124/84  11/25/20 136/76  A/P: ***  #hyperlipidemia S: Medication: Crestor 10 mg daily Lab Results  Component Value Date   CHOL 105 10/03/2019   HDL 38 (L) 10/03/2019   LDLCALC 45 10/03/2019   LDLDIRECT 50.0 05/03/2016   TRIG 135 10/03/2019   CHOLHDL 2.8 10/03/2019   A/P: ***  Health Maintenance Due  Topic Date Due   Zoster Vaccines- Shingrix (1 of 2) Never done   COVID-19 Vaccine (4 - Booster for Pfizer series) 10/30/2020   INFLUENZA VACCINE  02/21/2021   Recommended follow up: No follow-ups on file. Future Appointments  Date Time Provider [place]  05/13/2021  2:20 PM [name], MD [facility]  05/16/2021  2:30 PM [facility] HEALTH COACH [facility]  07/26/2021  3:00 PM [name], [name], DO [facility] None    Lab/Order associations: No diagnosis found.  No orders of the defined types were placed in this encounter.   I,Jada Eulalio,acting as a scribe for [name], MD.,have documented all relevant documentation on the behalf of [name], MD,as directed by  [name], MD while in the presence of [name], MD.  *** Return precautions advised.  Burnett Corrente

## 2021-05-11 DIAGNOSIS — G20 Parkinson's disease: Secondary | ICD-10-CM | POA: Diagnosis not present

## 2021-05-11 DIAGNOSIS — Z20828 Contact with and (suspected) exposure to other viral communicable diseases: Secondary | ICD-10-CM | POA: Diagnosis not present

## 2021-05-11 DIAGNOSIS — R2689 Other abnormalities of gait and mobility: Secondary | ICD-10-CM | POA: Diagnosis not present

## 2021-05-11 DIAGNOSIS — R278 Other lack of coordination: Secondary | ICD-10-CM | POA: Diagnosis not present

## 2021-05-11 DIAGNOSIS — R2681 Unsteadiness on feet: Secondary | ICD-10-CM | POA: Diagnosis not present

## 2021-05-13 ENCOUNTER — Ambulatory Visit: Payer: PPO | Admitting: Family Medicine

## 2021-05-13 DIAGNOSIS — Z23 Encounter for immunization: Secondary | ICD-10-CM

## 2021-05-13 DIAGNOSIS — E785 Hyperlipidemia, unspecified: Secondary | ICD-10-CM

## 2021-05-13 DIAGNOSIS — I10 Essential (primary) hypertension: Secondary | ICD-10-CM

## 2021-05-13 DIAGNOSIS — I890 Lymphedema, not elsewhere classified: Secondary | ICD-10-CM

## 2021-05-13 DIAGNOSIS — G232 Striatonigral degeneration: Secondary | ICD-10-CM

## 2021-05-13 DIAGNOSIS — R531 Weakness: Secondary | ICD-10-CM

## 2021-05-16 ENCOUNTER — Ambulatory Visit (INDEPENDENT_AMBULATORY_CARE_PROVIDER_SITE_OTHER): Payer: PPO

## 2021-05-16 ENCOUNTER — Other Ambulatory Visit: Payer: Self-pay

## 2021-05-16 DIAGNOSIS — Z0000 Encounter for general adult medical examination without abnormal findings: Secondary | ICD-10-CM | POA: Diagnosis not present

## 2021-05-16 NOTE — Patient Instructions (Signed)
Nicholas [name] , Thank you for taking time to come for your Medicare Wellness Visit. I appreciate your ongoing commitment to your health goals. Please review the following plan we discussed and let me know if I can assist you in the future.   Screening recommendations/referrals: Colonoscopy: Done 08/26/19 repeat every 3 years cologuard  Recommended yearly ophthalmology/optometry visit for glaucoma screening and checkup Recommended yearly dental visit for hygiene and checkup  Vaccinations: Influenza vaccine: Due and discussed  Pneumococcal vaccine: Up to date Tdap vaccine: Done 10/20/20 repeat every 10 years  Shingles vaccine: Shingrix discussed. Please contact your pharmacy for coverage information.    Covid-19: Completed 1/21, 2/21, 07/01/20  Advanced directives: Please bring a copy of your health care power of attorney and living will to the office at your convenience.  Conditions/risks identified: None at this time   Next appointment: Follow up in one year for your annual wellness visit.   Preventive Care 33 Years and Older, Male Preventive care refers to lifestyle choices and visits with your health care provider that can promote health and wellness. What does preventive care include? A yearly physical exam. This is also called an annual well check. Dental exams once or twice a year. Routine eye exams. Ask your health care provider how often you should have your eyes checked. Personal lifestyle choices, including: Daily care of your teeth and gums. Regular physical activity. Eating a healthy diet. Avoiding tobacco and drug use. Limiting alcohol use. Practicing safe sex. Taking low doses of aspirin every day. Taking vitamin and mineral supplements as recommended by your health care provider. What happens during an annual well check? The services and screenings done by your health care provider during your annual well check will depend on your age, overall health, lifestyle risk factors,  and family history of disease. Counseling  Your health care provider may ask you questions about your: Alcohol use. Tobacco use. Drug use. Emotional well-being. Home and relationship well-being. Sexual activity. Eating habits. History of falls. Memory and ability to understand (cognition). Work and work Statistician. Screening  You may have the following tests or measurements: Height, weight, and BMI. Blood pressure. Lipid and cholesterol levels. These may be checked every 5 years, or more frequently if you are over 31 years old. Skin check. Lung cancer screening. You may have this screening every year starting at age 2 if you have a 30-pack-year history of smoking and currently smoke or have quit within the past 15 years. Fecal occult blood test (FOBT) of the stool. You may have this test every year starting at age 18. Flexible sigmoidoscopy or colonoscopy. You may have a sigmoidoscopy every 5 years or a colonoscopy every 10 years starting at age 71. Prostate cancer screening. Recommendations will vary depending on your family history and other risks. Hepatitis C blood test. Hepatitis B blood test. Sexually transmitted disease (STD) testing. Diabetes screening. This is done by checking your blood sugar (glucose) after you have not eaten for a while (fasting). You may have this done every 1-3 years. Abdominal aortic aneurysm (AAA) screening. You may need this if you are a current or former smoker. Osteoporosis. You may be screened starting at age 30 if you are at high risk. Talk with your health care provider about your test results, treatment options, and if necessary, the need for more tests. Vaccines  Your health care provider may recommend certain vaccines, such as: Influenza vaccine. This is recommended every year. Tetanus, diphtheria, and acellular pertussis (Tdap, Td) vaccine.  You may need a Td booster every 10 years. Zoster vaccine. You may need this after age  9. Pneumococcal 13-valent conjugate (PCV13) vaccine. One dose is recommended after age 39. Pneumococcal polysaccharide (PPSV23) vaccine. One dose is recommended after age 40. Talk to your health care provider about which screenings and vaccines you need and how often you need them. This information is not intended to replace advice given to you by your health care provider. Make sure you discuss any questions you have with your health care provider. Document Released: 08/06/2015 Document Revised: 03/29/2016 Document Reviewed: 05/11/2015 Elsevier Interactive Patient Education  2017 [place] Prevention in the Home Falls can cause injuries. They can happen to people of all ages. There are many things you can do to make your home safe and to help prevent falls. What can I do on the outside of my home? Regularly fix the edges of walkways and driveways and fix any cracks. Remove anything that might make you trip as you walk through a door, such as a raised step or threshold. Trim any bushes or trees on the path to your home. Use bright outdoor lighting. Clear any walking paths of anything that might make someone trip, such as rocks or tools. Regularly check to see if handrails are loose or broken. Make sure that both sides of any steps have handrails. Any raised decks and porches should have guardrails on the edges. Have any leaves, snow, or ice cleared regularly. Use sand or salt on walking paths during winter. Clean up any spills in your garage right away. This includes oil or grease spills. What can I do in the bathroom? Use night lights. Install grab bars by the toilet and in the tub and shower. Do not use towel bars as grab bars. Use non-skid mats or decals in the tub or shower. If you need to sit down in the shower, use a plastic, non-slip stool. Keep the floor dry. Clean up any water that spills on the floor as soon as it happens. Remove soap buildup in the tub or shower  regularly. Attach bath mats securely with double-sided non-slip rug tape. Do not have throw rugs and other things on the floor that can make you trip. What can I do in the bedroom? Use night lights. Make sure that you have a light by your bed that is easy to reach. Do not use any sheets or blankets that are too big for your bed. They should not hang down onto the floor. Have a firm chair that has side arms. You can use this for support while you get dressed. Do not have throw rugs and other things on the floor that can make you trip. What can I do in the kitchen? Clean up any spills right away. Avoid walking on wet floors. Keep items that you use a lot in easy-to-reach places. If you need to reach something above you, use a strong step stool that has a grab bar. Keep electrical cords out of the way. Do not use floor polish or wax that makes floors slippery. If you must use wax, use non-skid floor wax. Do not have throw rugs and other things on the floor that can make you trip. What can I do with my stairs? Do not leave any items on the stairs. Make sure that there are handrails on both sides of the stairs and use them. Fix handrails that are broken or loose. Make sure that handrails are as long as  the stairways. Check any carpeting to make sure that it is firmly attached to the stairs. Fix any carpet that is loose or worn. Avoid having throw rugs at the top or bottom of the stairs. If you do have throw rugs, attach them to the floor with carpet tape. Make sure that you have a light switch at the top of the stairs and the bottom of the stairs. If you do not have them, ask someone to add them for you. What else can I do to help prevent falls? Wear shoes that: Do not have high heels. Have rubber bottoms. Are comfortable and fit you well. Are closed at the toe. Do not wear sandals. If you use a stepladder: Make sure that it is fully opened. Do not climb a closed stepladder. Make sure that  both sides of the stepladder are locked into place. Ask someone to hold it for you, if possible. Clearly mark and make sure that you can see: Any grab bars or handrails. First and last steps. Where the edge of each step is. Use tools that help you move around (mobility aids) if they are needed. These include: Canes. Walkers. Scooters. Crutches. Turn on the lights when you go into a dark area. Replace any light bulbs as soon as they burn out. Set up your furniture so you have a clear path. Avoid moving your furniture around. If any of your floors are uneven, fix them. If there are any pets around you, be aware of where they are. Review your medicines with your doctor. Some medicines can make you feel dizzy. This can increase your chance of falling. Ask your doctor what other things that you can do to help prevent falls. This information is not intended to replace advice given to you by your health care provider. Make sure you discuss any questions you have with your health care provider. Document Released: 05/06/2009 Document Revised: 12/16/2015 Document Reviewed: 08/14/2014 Elsevier Interactive Patient Education  2017 [employer].

## 2021-05-16 NOTE — Progress Notes (Addendum)
Virtual Visit via Telephone Note  I connected with  Nicholas [name] on 05/16/21 at  2:30 PM EDT by telephone and verified that I am speaking with the correct person using two identifiers.  Medicare Annual Wellness visit completed telephonically due to Covid-19 pandemic.   Persons participating in this call: This Health Coach and this patient.   Location: Patient: home Provider: office   I discussed the limitations, risks, security and privacy concerns of performing an evaluation and management service by telephone and the availability of in person appointments. The patient expressed understanding and agreed to proceed.  Unable to perform video visit due to video visit attempted and failed and/or patient does not have video capability.   Some vital signs may be absent or patient reported.   Willette Brace, LPN   Subjective:   Nicholas [name] is a 75 y.o. male who presents for Medicare Annual/Subsequent preventive examination.  Review of Systems     Cardiac Risk Factors include: advanced age (>40men, >1 women);hypertension;dyslipidemia;male gender;obesity (BMI >30kg/m2)     Objective:    There were no vitals filed for this visit. There is no height or weight on file to calculate BMI.  Advanced Directives 05/16/2021 01/11/2021 11/04/2020 07/13/2020 05/21/2020 05/14/2020 04/12/2020  Does Patient Have a Medical Advance Directive? Yes Yes Yes Yes Yes Yes Yes  Type of Paramedic of [place];Living will;Out of facility DNR (pink MOST or yellow form) [place];Living will [place];Living will [place];Living will [place];Living will [place]  Does patient want to make changes to medical advance directive? - - No - Patient declined - No - Patient declined - No - Patient declined  Copy of [place] in Chart? No - copy  requested - No - copy requested - - No - copy requested No - copy requested  Would patient like information on creating a medical advance directive? - - - - - - -    Current Medications (verified) Outpatient Encounter Medications as of 05/16/2021  Medication Sig   albuterol (VENTOLIN HFA) 108 (90 Base) MCG/ACT inhaler Inhale 2 puffs into the lungs every 6 (six) hours as needed for wheezing or shortness of breath.   carbidopa-levodopa (SINEMET IR) 25-100 MG tablet TAKE 2 TABLETS AT 7AM,11AM, 3PM, AND AT 7PM.   rosuvastatin (CRESTOR) 10 MG tablet TAKE ONE TABLET BY MOUTH DAILY   Infant Care Products (DERMACLOUD) OINT Apply topically.   [DISCONTINUED] GEMTESA 75 MG TABS Take 1 tablet by mouth daily. (Patient not taking: Reported on 05/16/2021)   No facility-administered encounter medications on file as of 05/16/2021.    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    Hyperlipidemia    Hypertension    Lymphedema    Past Surgical History:  Procedure Laterality Date   4th toe- 2nd primary melanoma  2009   removal of melanoma of left foot/amputation of toes 1&25 Jul 2006   WRIST FRACTURE SURGERY     75 years old-set   Family History  Problem Relation Age of Onset   Hypertension Father    CVA Father        age 91   Hyperlipidemia Father    Other Mother        Deceased   Healthy  Sister    Healthy Brother    Social History   Socioeconomic History   Marital status: Married    Spouse name: Not on file   Number of children: Not on file   Years of education: Not on file   Highest education level: Not on file  Occupational History   Occupation: retired    Comment: Engineer, maintenance (IT)  Tobacco Use   Smoking status: Former    Packs/day: 1.00    Years: 16.00    Pack years: 16.00    Types: Cigarettes    Quit date: 12/22/1993    Years since quitting: 27.4   Smokeless  tobacco: Never  Vaping Use   Vaping Use: Never used  Substance and Sexual Activity   Alcohol use: No    Alcohol/week: 21.0 standard drinks    Types: 21 Standard drinks or equivalent per week   Drug use: No   Sexual activity: Not on file  Other Topics Concern   Not on file  Social History Narrative   Married 1981. Wife has kids-1 with 1 adopted grandchild.       Retired Tax adviser      Highest level of education:  B.S.      Hobbies: antiques, former Air cabin crew      Exercise: none currently.    Social Determinants of Health   Financial Resource Strain: Low Risk    Difficulty of Paying Living Expenses: Not hard at all  Food Insecurity: No Food Insecurity   Worried About Charity fundraiser in the Last Year: Never true   [place] in the Last Year: Never true  Transportation Needs: No Transportation Needs   Lack of Transportation (Medical): No   Lack of Transportation (Non-Medical): No  Physical Activity: Insufficiently Active   Days of Exercise per Week: 2 days   Minutes of Exercise per Session: 60 min  Stress: Not on file  Social Connections: Moderately Isolated   Frequency of Communication with Friends and Family: More than three times a week   Frequency of Social Gatherings with Friends and Family: Once a week   Attends Religious Services: Never   Marine scientist or Organizations: No   Attends Music therapist: Never   Marital Status: Married    Tobacco Counseling Counseling given: Not Answered   Clinical Intake:  Pre-visit preparation completed: Yes  Pain : No/denies pain     BMI - recorded: 40.35 Nutritional Status: BMI > 30  Obese Nutritional Risks: None Diabetes: No  How often do you need to have someone help you when you read instructions, pamphlets, or other written materials from your doctor or pharmacy?: 1 - Never  Diabetic?No  Interpreter Needed?: No  Information entered by :: Charlott Rakes, LPN   Activities of  Daily Living In your present state of health, do you have any difficulty performing the following activities: 05/16/2021 11/04/2020  Hearing? N N  Vision? N N  Difficulty concentrating or making decisions? N N  Walking or climbing stairs? Y Y  Comment - secondary to weakness and parkinsons  Dressing or bathing? N Y  Comment has assist -  Doing errands, shopping? N Y  Conservation officer, nature and eating ? N -  Using the Toilet? N -  In the past six months, have you accidently leaked urine? Y -  Comment wears a brief -  Do you have problems with loss of bowel control? Y -  Comment pt wears briefs -  Managing your Medications?  N -  Managing your Finances? N -  Housekeeping or managing your Housekeeping? N -  Some recent data might be hidden    Patient Care Team: [name], MD as PCP - General (Family Medicine) [name], MD as Consulting Physician (Dermatology) [name], MD as Consulting Physician (Ophthalmology) [name], MD as Consulting Physician (Orthopedic Surgery) [name], DDS as Referring Physician (Dentistry) [name], PT as Physical Therapist (Physical Therapy) [name], [name], DO as Consulting Physician (Neurology) [name] as Consulting Physician (Optometry) [facility] [place], MD as Attending Physician Kathie Rhodes, MD (Inactive) as Consulting Physician (Urology)  Indicate any recent Medical Services you may have received from other than [facility] providers in the past year (date may be approximate).     Assessment:   This is a routine wellness examination for [facility].  Hearing/Vision screen Hearing Screening - Comments:: Pt denies any hearing issues  Vision Screening - Comments:: Pt follows up with Dr Alois Cliche for annual eye exams   Dietary issues and exercise activities discussed: Current Exercise Habits: Home exercise routine, Type of exercise: Other - see comments (PT twice a week and other days 15 min), Time (Minutes): 60,  Frequency (Times/Week): 2, Weekly Exercise (Minutes/Week): 120   Goals Addressed             This Visit's Progress    Patient Stated       None at this time        Depression Screen PHQ 2/9 Scores 05/16/2021 01/13/2021 09/06/2020 05/14/2020 12/12/2019 10/03/2019 04/14/2019  PHQ - 2 Score 0 0 0 0 0 0 0  PHQ- 9 Score - - - - - 0 -    Fall Risk Fall Risk  05/16/2021 01/11/2021 11/25/2020 07/13/2020 05/14/2020  Falls in the past year? 0 1 0 1 0  Number falls in past yr: 0 1 0 1 1  Injury with Fall? 0 1 0 0 0  Risk Factor Category  - - - - -  Risk for fall due to : Impaired vision;Impaired balance/gait;Impaired mobility - - - History of fall(s);Impaired mobility;Impaired balance/gait  Follow up Falls prevention discussed - - - Falls prevention discussed    FALL RISK PREVENTION PERTAINING TO THE HOME:  Any stairs in or around the home? No  If so, are there any without handrails? No  Home free of loose throw rugs in walkways, pet beds, electrical cords, etc? Yes  Adequate lighting in your home to reduce risk of falls? Yes   ASSISTIVE DEVICES UTILIZED TO PREVENT FALLS:  Life alert? Yes  Use of a cane, walker or w/c? Yes  Grab bars in the bathroom? Yes  Shower chair or bench in shower? Yes  Elevated toilet seat or a handicapped toilet? Yes   TIMED UP AND GO:  Was the test performed? No .   Cognitive Function:     6CIT Screen 05/16/2021 05/14/2020 04/14/2019  What Year? 0 points 0 points 0 points  What month? 0 points 0 points 0 points  What time? 0 points - 0 points  Count back from 20 0 points 0 points 0 points  Months in reverse 0 points 0 points 0 points  Repeat phrase 0 points 0 points 0 points  Total Score 0 - 0    Immunizations Immunization History  Administered Date(s) Administered   Fluad Quad(high Dose 65+) 04/14/2019, 04/13/2020   Influenza Split 04/24/2011, 04/24/2012   Influenza Whole 05/19/2008, 04/15/2010   Influenza, High Dose Seasonal PF  05/19/2013,  05/03/2016, 05/07/2017, 05/07/2018   Influenza,inj,Quad PF,6+ Mos 04/29/2014, 05/04/2015   Moderna Sars-Covid-2 Vaccination 07/01/2020   PFIZER(Purple Top)SARS-COV-2 Vaccination 08/14/2019, 09/04/2019   Pneumococcal Conjugate-13 12/30/2014   Pneumococcal Polysaccharide-23 04/24/2011   Td 07/24/2001   Tdap 08/02/2012, 10/20/2020   Zoster, Live 04/24/2011    TDAP status: Up to date  Flu Vaccine status: Due, Education has been provided regarding the importance of this vaccine. Advised may receive this vaccine at local pharmacy or Health Dept. Aware to provide a copy of the vaccination record if obtained from local pharmacy or Health Dept. Verbalized acceptance and understanding.  Pneumococcal vaccine status: Up to date  Covid-19 vaccine status: Completed vaccines  Qualifies for Shingles Vaccine? Yes   Zostavax completed No   Shingrix Completed?: No.    Education has been provided regarding the importance of this vaccine. Patient has been advised to call insurance company to determine out of pocket expense if they have not yet received this vaccine. Advised may also receive vaccine at local pharmacy or Health Dept. Verbalized acceptance and understanding.  Screening Tests Health Maintenance  Topic Date Due   Zoster Vaccines- Shingrix (1 of 2) Never done   COVID-19 Vaccine (4 - Booster for Pfizer series) 08/26/2020   INFLUENZA VACCINE  02/21/2021   Hepatitis C Screening  07/21/2098 (Originally 04/02/1964)   Fecal DNA (Cologuard)  08/25/2022   TETANUS/TDAP  10/21/2030   Pneumonia Vaccine 100+ Years old  Completed   HPV VACCINES  Aged Out    Health Maintenance  Health Maintenance Due  Topic Date Due   Zoster Vaccines- Shingrix (1 of 2) Never done   COVID-19 Vaccine (4 - Booster for Pfizer series) 08/26/2020   INFLUENZA VACCINE  02/21/2021    Colorectal cancer screening: Type of screening: Cologuard. Completed 08/26/19. Repeat every 3 years   Additional Screening:  Hepatitis C  Screening: does qualify;   Vision Screening: Recommended annual ophthalmology exams for early detection of glaucoma and other disorders of the eye. Is the patient up to date with their annual eye exam?  Yes  Who is the provider or what is the name of the office in which the patient attends annual eye exams? Dr Alois Cliche If pt is not established with a provider, would they like to be referred to a provider to establish care? No .   Dental Screening: Recommended annual dental exams for proper oral hygiene  Community Resource Referral / Chronic Care Management: CRR required this visit?  No   CCM required this visit?  No      Plan:     I have personally reviewed and noted the following in the patient's chart:   Medical and social history Use of alcohol, tobacco or illicit drugs  Current medications and supplements including opioid prescriptions. Patient is not currently taking opioid prescriptions. Functional ability and status Nutritional status Physical activity Advanced directives List of other physicians Hospitalizations, surgeries, and ER visits in previous 12 months Vitals Screenings to include cognitive, depression, and falls Referrals and appointments  In addition, I have reviewed and discussed with patient certain preventive protocols, quality metrics, and best practice recommendations. A written personalized care plan for preventive services as well as general preventive health recommendations were provided to patient.     Willette Brace, LPN   [id number]   Nurse Notes: None

## 2021-05-17 ENCOUNTER — Ambulatory Visit (INDEPENDENT_AMBULATORY_CARE_PROVIDER_SITE_OTHER): Payer: PPO | Admitting: Family Medicine

## 2021-05-17 DIAGNOSIS — E785 Hyperlipidemia, unspecified: Secondary | ICD-10-CM

## 2021-05-17 DIAGNOSIS — R2681 Unsteadiness on feet: Secondary | ICD-10-CM | POA: Diagnosis not present

## 2021-05-17 DIAGNOSIS — R739 Hyperglycemia, unspecified: Secondary | ICD-10-CM

## 2021-05-17 DIAGNOSIS — R278 Other lack of coordination: Secondary | ICD-10-CM | POA: Diagnosis not present

## 2021-05-17 DIAGNOSIS — R351 Nocturia: Secondary | ICD-10-CM

## 2021-05-17 DIAGNOSIS — G4733 Obstructive sleep apnea (adult) (pediatric): Secondary | ICD-10-CM

## 2021-05-17 DIAGNOSIS — G20 Parkinson's disease: Secondary | ICD-10-CM | POA: Diagnosis not present

## 2021-05-17 DIAGNOSIS — N401 Enlarged prostate with lower urinary tract symptoms: Secondary | ICD-10-CM

## 2021-05-17 DIAGNOSIS — I10 Essential (primary) hypertension: Secondary | ICD-10-CM

## 2021-05-17 DIAGNOSIS — G232 Striatonigral degeneration: Secondary | ICD-10-CM

## 2021-05-17 DIAGNOSIS — R2689 Other abnormalities of gait and mobility: Secondary | ICD-10-CM | POA: Diagnosis not present

## 2021-05-17 NOTE — Progress Notes (Signed)
Patient was not able to make it in for visit-his wife was seen in this place per their request.  No no charge fee should be charged

## 2021-05-17 NOTE — Patient Instructions (Signed)
Health Maintenance Due  Topic Date Due   Zoster Vaccines- Shingrix (1 of 2) - Discuss with patient. Never done   COVID-19 Vaccine (4 - Booster for [employer] series) - Recommend getting Omicron/Bivalent booster only at your local pharmacy! Please let us know when you have received this vaccination.  08/26/2020   INFLUENZA VACCINE  - High dose flu shot 02/21/2021    Recommended follow up: No follow-ups on file.

## 2021-05-19 DIAGNOSIS — R2681 Unsteadiness on feet: Secondary | ICD-10-CM | POA: Diagnosis not present

## 2021-05-19 DIAGNOSIS — R278 Other lack of coordination: Secondary | ICD-10-CM | POA: Diagnosis not present

## 2021-05-19 DIAGNOSIS — R2689 Other abnormalities of gait and mobility: Secondary | ICD-10-CM | POA: Diagnosis not present

## 2021-05-19 DIAGNOSIS — G20 Parkinson's disease: Secondary | ICD-10-CM | POA: Diagnosis not present

## 2021-05-20 NOTE — Progress Notes (Signed)
Phone [phone] In person visit   Subjective:   Nicholas [name] is a 75 y.o. year old very pleasant male patient who presents for/with See problem oriented charting Chief Complaint  Patient presents with   Cellulitis   This visit occurred during the SARS-CoV-2 public health emergency.  Safety protocols were in place, including screening questions prior to the visit, additional usage of staff PPE, and extensive cleaning of exam room while observing appropriate contact time as indicated for disinfecting solutions.   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 left leg 03/09/2009    Priority: High   BPH associated with nocturia 05/16/2016    Priority: Medium    OSA (obstructive sleep apnea) 12/28/2015    Priority: Medium    Diastolic dysfunction 32/44/0102    Priority: Medium    Chronic venous insufficiency 02/20/2014    Priority: Medium    Hyperglycemia 08/02/2012    Priority: Medium    Hyperlipidemia 12/13/2006    Priority: Medium    Essential hypertension 12/13/2006    Priority: Medium    Senile purpura ([place]) 05/03/2016    Priority: Low   Hypersomnia 11/15/2015    Priority: Low   Former smoker 04/29/2014    Priority: Low   Morbid obesity ([place]) 07/13/2010    Priority: Low   Insomnia 10/03/2007    Priority: Low   Parkinson's disease ([place]) 01/11/2021   Sepsis without acute organ dysfunction ([place])    Cellulitis 04/12/2020   Strain of right biceps 09/12/2017   Cough 11/15/2015   Dizziness 12/30/2014    Medications- reviewed and updated Current Outpatient Medications  Medication Sig Dispense Refill   albuterol (VENTOLIN HFA) 108 (90 Base) MCG/ACT inhaler Inhale 2 puffs into the lungs every 6 (six) hours as needed for wheezing or shortness of breath. 18 g 1   carbidopa-levodopa (SINEMET IR)  25-100 MG tablet TAKE 2 TABLETS AT 7AM,11AM, 3PM, AND AT 7PM. 720 tablet 1   Infant Care Products (DERMACLOUD) OINT Apply topically.     rosuvastatin (CRESTOR) 10 MG tablet TAKE ONE TABLET BY MOUTH DAILY 90 tablet 0   No current facility-administered medications for this visit.     Objective:  BP 120/72 (BP Location: Left Arm, Patient Position: Sitting, Cuff Size: Large)   Pulse 95   Temp 98.7 F (37.1 C) (Temporal)   Ht 5\' 6"  (1.676 m)   Wt 233 lb 6.1 oz (105.9 kg)   SpO2 96%   BMI 37.67 kg/m  Gen: NAD, resting comfortably CV: RRR no murmurs rubs or gallops Lungs: CTAB no crackles, wheeze, rhonchi Abdomen: soft/nontender/nondistended/normal bowel sounds. No rebound or guarding.  Ext: Lymphedema status noted but much improved-trace pitting on top of still profound lymphedema but once again much improved from baseline Patient is in wheelchair today but reports able to walk without the assistance of another human at this point    Assessment and Plan    #social update- we fought very hard for patient to get coverage for SNF.  They were able to make significant enough progress with lymphedema and patient was able to get significant weight off-since being home he has been able to work with the lymphedema specialist through [facility].  Swelling is down substantially.  Working with PT and with less swelling in his legs he is able  to walk again!  In fact last night his wife did not have to help him get up out of bed.  Prior leg lacerations have finally healed.  Patient is down nearly 50 pounds and reports much of this is from loss of fluid-also did not enjoy the food in SNF/[facility] and believes he lost some weight there as well  # concern for cellulitis in patient with lymphedema S: left leg slightly improving when the wrap was removed yesterday.  Started getting more red last week- nurse was concerned for infection- may have started after a wrinkle in compression stocking last  week.   Right leg slightly red/shiny A/P: I do not see clear evidence of cellulitis but if he has new or worsening symptoms should certainly let us know-no warmth, tenderness.  Has mild redness from venous stasis changes    #Parkinson's plus syndrome-followed by Dr. Carles Collet of [facility] S: Medication: Carbidopa levodopa 25-100 mg-patient takes 2 tablets at 7 AM, 11 AM, 3 PM and 7 PM  Patient had caregiver  6 days a week up from 3 days a week.   Patient also had some incontinence issues- he was on gemtesa around the tim of last visit- off now- was not as helpful- doing condom catheter but issues with keeping it on. Prefers to avoid indwelling catheter  Has a device that is able to help him stand- looking to get one for left side as well- on right at present.   Doing very well with PT and is able to stand on his own now! Large improvement A/P: I am thrilled with patient's progress in being able to get up without assist at this point-obviously if he were to have falls that could be a big setback-would still be great for him to have a cyst close to him.  Has more potential to remain at home now that he is able to get up without wife's assistance-plus there was some caregiver burden there which is clearly improving given his improvement  #hypertension S: medication: None at this point-has been taken off medication BP Readings from Last 3 Encounters:  05/24/21 120/72  01/13/21 116/72  01/11/21 124/84  A/P: controlled without meds- continue to monitor  #hyperlipidemia S: Medication: Crestor 10 mg daily Lab Results  Component Value Date   CHOL 105 10/03/2019   HDL 38 (L) 10/03/2019   LDLCALC 45 10/03/2019   LDLDIRECT 50.0 05/03/2016   TRIG 135 10/03/2019   CHOLHDL 2.8 10/03/2019   A/P: hopefully stable- update lipid panel today. Continue current meds for now  # encouraged bivalent vaccination from covid. Flu shot today high dose  #Morbid obesity-patient with hyperlipidemia and  hypertension and BMI over 35-congratulated him on weight loss though some of this could have been due to improvements in lymphedema  Recommended follow up: Return in about 6 months (around 11/21/2021) for physical or sooner if needed. Future Appointments  Date Time Provider [place]  07/26/2021  3:00 PM [name], [name], DO [facility] None  06/05/2022  2:30 PM [facility] HEALTH COACH [facility]   Lab/Order associations:   ICD-10-CM   1. Lymphedema  I89.0     2. 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 Essential hypertension  I10     4. Hyperlipidemia, unspecified hyperlipidemia type  E78.5 CBC with Differential/Platelet    Comprehensive metabolic panel    Lipid panel    5. Need for immunization against influenza  Z23     6. Morbid obesity ([place])  Chronic E66.01     7. Senile purpura ([facility]) Chronic-patient with easy bruising and bleeding noted in general-thankfully wounds healed on the back of his leg.  Monitor-check CBC as well D69.2        I,Jada Terrion,acting as a scribe for [name], MD.,have documented all relevant documentation on the behalf of [name], MD,as directed by  [name], MD while in the presence of [name], MD.  I, [name], MD, have reviewed all documentation for this visit. The documentation on 05/24/21 for the exam, diagnosis, procedures, and orders are all accurate and complete.  Return precautions advised.  [name], MD

## 2021-05-23 DIAGNOSIS — R2681 Unsteadiness on feet: Secondary | ICD-10-CM | POA: Diagnosis not present

## 2021-05-23 DIAGNOSIS — R2689 Other abnormalities of gait and mobility: Secondary | ICD-10-CM | POA: Diagnosis not present

## 2021-05-23 DIAGNOSIS — R278 Other lack of coordination: Secondary | ICD-10-CM | POA: Diagnosis not present

## 2021-05-23 DIAGNOSIS — G20 Parkinson's disease: Secondary | ICD-10-CM | POA: Diagnosis not present

## 2021-05-24 ENCOUNTER — Ambulatory Visit (INDEPENDENT_AMBULATORY_CARE_PROVIDER_SITE_OTHER): Payer: PPO | Admitting: Family Medicine

## 2021-05-24 ENCOUNTER — Other Ambulatory Visit: Payer: Self-pay

## 2021-05-24 ENCOUNTER — Encounter: Payer: Self-pay | Admitting: Family Medicine

## 2021-05-24 VITALS — BP 120/72 | HR 95 | Temp 98.7°F | Ht 66.0 in | Wt 233.4 lb

## 2021-05-24 DIAGNOSIS — E785 Hyperlipidemia, unspecified: Secondary | ICD-10-CM | POA: Diagnosis not present

## 2021-05-24 DIAGNOSIS — Z23 Encounter for immunization: Secondary | ICD-10-CM | POA: Diagnosis not present

## 2021-05-24 DIAGNOSIS — D692 Other nonthrombocytopenic purpura: Secondary | ICD-10-CM

## 2021-05-24 DIAGNOSIS — G232 Striatonigral degeneration: Secondary | ICD-10-CM

## 2021-05-24 DIAGNOSIS — I890 Lymphedema, not elsewhere classified: Secondary | ICD-10-CM

## 2021-05-24 DIAGNOSIS — I10 Essential (primary) hypertension: Secondary | ICD-10-CM

## 2021-05-24 LAB — COMPREHENSIVE METABOLIC PANEL
ALT: 3 U/L (ref 0–53)
AST: 9 U/L (ref 0–37)
Albumin: 4.1 g/dL (ref 3.5–5.2)
Alkaline Phosphatase: 82 U/L (ref 39–117)
BUN: 22 mg/dL (ref 6–23)
CO2: 28 mEq/L (ref 19–32)
Calcium: 9 mg/dL (ref 8.4–10.5)
Chloride: 106 mEq/L (ref 96–112)
Creatinine, Ser: 0.87 mg/dL (ref 0.40–1.50)
GFR: 84.62 mL/min (ref >60.00)
Glucose, Bld: 87 mg/dL (ref 70–99)
Potassium: 4.2 mEq/L (ref 3.5–5.1)
Sodium: 141 mEq/L (ref 135–145)
Total Bilirubin: 0.4 mg/dL (ref 0.2–1.2)
Total Protein: 6.6 g/dL (ref 6.0–8.3)

## 2021-05-24 LAB — CBC WITH DIFFERENTIAL/PLATELET
Basophils Absolute: 0 10*3/uL (ref 0.0–0.1)
Basophils Relative: 0.4 % (ref 0.0–3.0)
Eosinophils Absolute: 0.3 10*3/uL (ref 0.0–0.7)
Eosinophils Relative: 3.3 % (ref 0.0–5.0)
HCT: 43.2 % (ref 39.0–52.0)
Hemoglobin: 14 g/dL (ref 13.0–17.0)
Lymphocytes Relative: 20.8 % (ref 12.0–46.0)
Lymphs Abs: 1.7 10*3/uL (ref 0.7–4.0)
MCHC: 32.4 g/dL (ref 30.0–36.0)
MCV: 88.6 fl (ref 78.0–100.0)
Monocytes Absolute: 0.6 10*3/uL (ref 0.1–1.0)
Monocytes Relative: 7.9 % (ref 3.0–12.0)
Neutro Abs: 5.5 10*3/uL (ref 1.4–7.7)
Neutrophils Relative %: 67.6 % (ref 43.0–77.0)
Platelets: 188 10*3/uL (ref 150.0–400.0)
RBC: 4.87 Mil/uL (ref 4.22–5.81)
RDW: 15.2 % (ref 11.5–15.5)
WBC: 8.1 10*3/uL (ref 4.0–10.5)

## 2021-05-24 LAB — LIPID PANEL
Cholesterol: 119 mg/dL (ref 0–200)
HDL: 35 mg/dL — ABNORMAL LOW (ref >39.00)
LDL Cholesterol: 45 mg/dL (ref 0–99)
NonHDL: 83.61
Total CHOL/HDL Ratio: 3
Triglycerides: 192 mg/dL — ABNORMAL HIGH (ref 0.0–149.0)
VLDL: 38.4 mg/dL (ref 0.0–40.0)

## 2021-05-24 NOTE — Patient Instructions (Addendum)
Health Maintenance Due  Topic Date Due   COVID-19 Vaccine (4 - Booster for [employer] series)- Please schedule a appointment for your bivalent booster shot at your local pharmacy and please send [place] the date you received so we can put it in our system.  08/26/2020   INFLUENZA VACCINE - High dose flu shot today in office.  02/21/2021   Please stop by lab before you go If you have mychart- we will send your results within 3 business days of [place] receiving them.  If you do not have mychart- we will call you about results within 5 business days of [place] receiving them.  *please also note that you will see labs on mychart as soon as they post. I will later go in and write notes on them- will say "notes from Dr. Yong Channel"  Recommended follow up: Return in about 6 months (around 11/21/2021) for physical or sooner if needed.

## 2021-05-25 DIAGNOSIS — Z20822 Contact with and (suspected) exposure to covid-19: Secondary | ICD-10-CM | POA: Diagnosis not present

## 2021-05-26 DIAGNOSIS — R278 Other lack of coordination: Secondary | ICD-10-CM | POA: Diagnosis not present

## 2021-05-26 DIAGNOSIS — R2689 Other abnormalities of gait and mobility: Secondary | ICD-10-CM | POA: Diagnosis not present

## 2021-05-26 DIAGNOSIS — G20 Parkinson's disease: Secondary | ICD-10-CM | POA: Diagnosis not present

## 2021-05-26 DIAGNOSIS — R2681 Unsteadiness on feet: Secondary | ICD-10-CM | POA: Diagnosis not present

## 2021-05-27 ENCOUNTER — Telehealth: Payer: Self-pay

## 2021-05-27 NOTE — Telephone Encounter (Signed)
Attempted to contact patient to schedule a Palliative Care consult appointment. No answer left a message to return call.  

## 2021-05-27 NOTE — Telephone Encounter (Signed)
Spoke with patient's wife Nicholas [name] and scheduled an in-person Palliative Consult for 06/17/21 @ [facility] screening was negative. One dog in the home will crate. Patient lives with wife.    Consent obtained; updated Outlook/Netsmart/Team List and Epic.   Family is aware they may be receiving a call from provider the day before or day of to confirm appointment.

## 2021-05-31 DIAGNOSIS — R278 Other lack of coordination: Secondary | ICD-10-CM | POA: Diagnosis not present

## 2021-05-31 DIAGNOSIS — R2681 Unsteadiness on feet: Secondary | ICD-10-CM | POA: Diagnosis not present

## 2021-05-31 DIAGNOSIS — R2689 Other abnormalities of gait and mobility: Secondary | ICD-10-CM | POA: Diagnosis not present

## 2021-05-31 DIAGNOSIS — G20 Parkinson's disease: Secondary | ICD-10-CM | POA: Diagnosis not present

## 2021-06-01 DIAGNOSIS — G20 Parkinson's disease: Secondary | ICD-10-CM | POA: Diagnosis not present

## 2021-06-01 DIAGNOSIS — R2689 Other abnormalities of gait and mobility: Secondary | ICD-10-CM | POA: Diagnosis not present

## 2021-06-01 DIAGNOSIS — R278 Other lack of coordination: Secondary | ICD-10-CM | POA: Diagnosis not present

## 2021-06-01 DIAGNOSIS — R2681 Unsteadiness on feet: Secondary | ICD-10-CM | POA: Diagnosis not present

## 2021-06-01 DIAGNOSIS — Z20828 Contact with and (suspected) exposure to other viral communicable diseases: Secondary | ICD-10-CM | POA: Diagnosis not present

## 2021-06-03 DIAGNOSIS — R2681 Unsteadiness on feet: Secondary | ICD-10-CM | POA: Diagnosis not present

## 2021-06-03 DIAGNOSIS — R278 Other lack of coordination: Secondary | ICD-10-CM | POA: Diagnosis not present

## 2021-06-03 DIAGNOSIS — G20 Parkinson's disease: Secondary | ICD-10-CM | POA: Diagnosis not present

## 2021-06-03 DIAGNOSIS — R2689 Other abnormalities of gait and mobility: Secondary | ICD-10-CM | POA: Diagnosis not present

## 2021-06-07 DIAGNOSIS — R278 Other lack of coordination: Secondary | ICD-10-CM | POA: Diagnosis not present

## 2021-06-07 DIAGNOSIS — R2681 Unsteadiness on feet: Secondary | ICD-10-CM | POA: Diagnosis not present

## 2021-06-07 DIAGNOSIS — G20 Parkinson's disease: Secondary | ICD-10-CM | POA: Diagnosis not present

## 2021-06-07 DIAGNOSIS — R2689 Other abnormalities of gait and mobility: Secondary | ICD-10-CM | POA: Diagnosis not present

## 2021-06-08 DIAGNOSIS — R2689 Other abnormalities of gait and mobility: Secondary | ICD-10-CM | POA: Diagnosis not present

## 2021-06-08 DIAGNOSIS — Z8616 Personal history of COVID-19: Secondary | ICD-10-CM | POA: Diagnosis not present

## 2021-06-08 DIAGNOSIS — G20 Parkinson's disease: Secondary | ICD-10-CM | POA: Diagnosis not present

## 2021-06-08 DIAGNOSIS — R278 Other lack of coordination: Secondary | ICD-10-CM | POA: Diagnosis not present

## 2021-06-08 DIAGNOSIS — R2681 Unsteadiness on feet: Secondary | ICD-10-CM | POA: Diagnosis not present

## 2021-06-10 DIAGNOSIS — G20 Parkinson's disease: Secondary | ICD-10-CM | POA: Diagnosis not present

## 2021-06-10 DIAGNOSIS — R278 Other lack of coordination: Secondary | ICD-10-CM | POA: Diagnosis not present

## 2021-06-10 DIAGNOSIS — R2689 Other abnormalities of gait and mobility: Secondary | ICD-10-CM | POA: Diagnosis not present

## 2021-06-10 DIAGNOSIS — R2681 Unsteadiness on feet: Secondary | ICD-10-CM | POA: Diagnosis not present

## 2021-06-14 DIAGNOSIS — R2681 Unsteadiness on feet: Secondary | ICD-10-CM | POA: Diagnosis not present

## 2021-06-14 DIAGNOSIS — R278 Other lack of coordination: Secondary | ICD-10-CM | POA: Diagnosis not present

## 2021-06-14 DIAGNOSIS — G20 Parkinson's disease: Secondary | ICD-10-CM | POA: Diagnosis not present

## 2021-06-14 DIAGNOSIS — R2689 Other abnormalities of gait and mobility: Secondary | ICD-10-CM | POA: Diagnosis not present

## 2021-06-15 DIAGNOSIS — Z20828 Contact with and (suspected) exposure to other viral communicable diseases: Secondary | ICD-10-CM | POA: Diagnosis not present

## 2021-06-15 DIAGNOSIS — G20 Parkinson's disease: Secondary | ICD-10-CM | POA: Diagnosis not present

## 2021-06-15 DIAGNOSIS — R278 Other lack of coordination: Secondary | ICD-10-CM | POA: Diagnosis not present

## 2021-06-15 DIAGNOSIS — R2681 Unsteadiness on feet: Secondary | ICD-10-CM | POA: Diagnosis not present

## 2021-06-15 DIAGNOSIS — R2689 Other abnormalities of gait and mobility: Secondary | ICD-10-CM | POA: Diagnosis not present

## 2021-06-17 ENCOUNTER — Other Ambulatory Visit: Payer: Self-pay

## 2021-06-17 ENCOUNTER — Other Ambulatory Visit: Payer: PPO | Admitting: Hospice

## 2021-06-17 DIAGNOSIS — G20 Parkinson's disease: Secondary | ICD-10-CM | POA: Diagnosis not present

## 2021-06-17 DIAGNOSIS — I890 Lymphedema, not elsewhere classified: Secondary | ICD-10-CM | POA: Diagnosis not present

## 2021-06-17 DIAGNOSIS — Z515 Encounter for palliative care: Secondary | ICD-10-CM | POA: Diagnosis not present

## 2021-06-17 NOTE — Progress Notes (Signed)
[facility] Palliative Care Consult Note Telephone: [phone]  Fax: [phone]  PATIENT NAME: Nicholas [name] [id number] (home)  DOB: 1946/04/20 MRN: [id number]  PRIMARY CARE PROVIDER:    [name], MD,  [place] [address] [phone]  REFERRING PROVIDER:   Marin [name], [address] [place],  [place] [address] [phone]  RESPONSIBLE PARTY:   Self/Spouse [phone] - Nicholas [name] - best number to call Contact Information     Name Relation Home Work Mobile   Selle,Nicholas [name] Spouse [phone]  [phone]        I met face to face with patient and family at home. Palliative Care was asked to follow this patient by consultation request of  Nicholas Olp, MD to address advance care planning, complex medical decision making and goals of care clarification. Nicholas [name] is at home with patient during visit. This is the initial visit.    ASSESSMENT AND / RECOMMENDATIONS:   Advance Care Planning: Our advance care planning conversation included a discussion about:    The value and importance of advance care planning  Difference between Hospice and Palliative care Exploration of goals of care in the event of a sudden injury or illness  Identification and preparation of a healthcare agent  Review and updating or creation of an  advance directive document . Decision not to resuscitate or to de-escalate disease focused treatments due to poor prognosis.  CODE STATUS:Patient is a Do Not Resuscitate. NP signed DNR form for patient to keep at home.   Goals of Care: Goals include to maximize quality of life and symptom management.  Patient expressed frustration with his health condition but said his love for his wife keeps him going. Therapeutic listening provided; ample emotional support provided.   I spent 25 minutes providing this initial consultation. More than  50% of the time in this consultation was spent on counseling patient and coordinating communication. --------------------------------------------------------------------------------------------------------------------------------------  Symptom Management/Plan: Parkinson Disease: Followed by a Neurologist - Nicholas [name]. Continue Sinemet and scheduled appointments with Neurologist. Continue ongoing physical therapy.  Lymphedema: related to Melanoma on his left feet. Ray - Lymphatic Specialist with [facility] follows patient at home twice a week to massage and wrap his bil lower extremities.   Follow up: Palliative care will continue to follow for complex medical decision making, advance care planning, and clarification of goals. Return 6 weeks or prn.Encouraged to call provider sooner with any concerns.   Family /Caregiver/Community Supports: Patient lives at home with spouse. He has caregivers that come to help patient in ADLs.   HOSPICE ELIGIBILITY/DIAGNOSIS: TBD  Chief Complaint: Initial Palliative care visit  HISTORY OF PRESENT ILLNESS:  Nicholas [name] is a 75 y.o. 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 It is worse when trying to initiate movement . He said ongoing physical therapy is helpful.  Patient denies pain/discomfort. History of Melanoma, Lymphedema.  History obtained from review of EMR, discussion with primary team, caregiver, family and/or [name].  Review and summarization of Epic records shows history from other than patient. Rest of 10 point ROS asked and negative.  I reviewed, as needed, available labs, patient records, imaging, studies and related documents from the EMR.  Latest Reference Range & Units 05/24/21 13:38  Sodium 135 - 145  mEq/L 141  Potassium 3.5 - 5.1 mEq/L 4.2  Chloride 96 - 112  mEq/L 106  CO2 19 - 32 mEq/L 28  Glucose 70 - 99 mg/dL 87  BUN 6 - 23 mg/dL 22  Creatinine 0.40 - 1.50 mg/dL 0.87  Calcium 8.4 - 10.5 mg/dL 9.0  Alkaline Phosphatase 39 - 117 U/L 82  Albumin 3.5 - 5.2 g/dL 4.1   ROS General: NAD EYES: denies vision changes ENMT: denies dysphagia Cardiovascular: denies chest pain/discomfort Pulmonary: denies cough, denies SOB Abdomen: endorses good appetite, denies constipation/diarrhea GU: denies dysuria, urinary frequency MSK:  endorses weakness,  no falls reported, Skin: denies rashes or wounds, endorses lymphedema in BLE Neurological: denies pain, denies insomnia Psych: Endorses positive mood Heme/lymph/immuno: denies bruises, abnormal bleeding  Physical Exam:  Constitutional: NAD General: Well groomed, cooperative EYES: anicteric sclera, lids intact, no discharge  ENMT: Moist mucous membrane CV: S1 S2, RRR Pulmonary: LCTA, no increased work of breathing, no cough, Abdomen: active BS + 4 quadrants, soft and non tender GU: no suprapubic tenderness MSK: weakness, limited ROM, lymphedema in BL extremities, Velcro wrap in place.  Skin: warm and dry, no rashes or wounds on visible skin Neuro:  weakness, otherwise non focal, oriented x 3 Psych: non-anxious affect Hem/lymph/immuno: no widespread bruising   PAST MEDICAL HISTORY:  Active Ambulatory Problems    Diagnosis Date Noted   History of malignant melanoma. 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   Hypersomnia 11/15/2015   Cough 11/15/2015   OSA (obstructive sleep apnea) 12/28/2015   Senile purpura ([place]) 05/03/2016   BPH associated with nocturia 05/16/2016   Lymphedema 02/10/2017    Strain of right biceps 09/12/2017   Cellulitis 04/12/2020   Sepsis without acute organ dysfunction ([place])    Parkinson's disease ([place]) 01/11/2021   Resolved Ambulatory Problems    Diagnosis Date Noted   Chronic venous insufficiency 11/20/2014   Past Medical History:  Diagnosis Date   Cancer [facility]) Jan 2008   Hypertension     SOCIAL HX:  Social History   Tobacco Use   Smoking status: Former    Packs/day: 1.00    Years: 16.00    Pack years: 16.00    Types: Cigarettes    Quit date: 12/22/1993    Years since quitting: 27.5   Smokeless tobacco: Never  Substance Use Topics   Alcohol use: No    Alcohol/week: 21.0 standard drinks    Types: 21 Standard drinks or equivalent per week     FAMILY HX:  Family History  Problem Relation Age of Onset   Hypertension Father    CVA Father        age 54   Hyperlipidemia Father    Other Mother        Deceased   Healthy Sister    Healthy Brother       ALLERGIES:  Allergies  Allergen Reactions   Amoxicillin Swelling    Presumed angioedema of lips due to prednisone      PERTINENT MEDICATIONS:  Outpatient Encounter Medications as of 06/17/2021  Medication Sig   albuterol (VENTOLIN HFA) 108 (90 Base) MCG/ACT inhaler Inhale 2 puffs into the lungs every 6 (six) hours as needed for wheezing or shortness of breath.  carbidopa-levodopa (SINEMET IR) 25-100 MG tablet TAKE 2 TABLETS AT 7AM,11AM, 3PM, AND AT 7PM.   Infant Care Products (DERMACLOUD) OINT Apply topically.   rosuvastatin (CRESTOR) 10 MG tablet TAKE ONE TABLET BY MOUTH DAILY   No facility-administered encounter medications on file as of 06/17/2021.     Thank you for the opportunity to participate in the care of Mr. Carmen.  The palliative care team will continue to follow. Please call our office at [phone] if we can be of additional assistance.   Note: Portions of this note were generated with [employer]. Dictation errors may occur despite best attempts  at proofreading.  Teodoro Spray, NP

## 2021-06-20 DIAGNOSIS — R278 Other lack of coordination: Secondary | ICD-10-CM | POA: Diagnosis not present

## 2021-06-20 DIAGNOSIS — G20 Parkinson's disease: Secondary | ICD-10-CM | POA: Diagnosis not present

## 2021-06-20 DIAGNOSIS — R2689 Other abnormalities of gait and mobility: Secondary | ICD-10-CM | POA: Diagnosis not present

## 2021-06-20 DIAGNOSIS — R2681 Unsteadiness on feet: Secondary | ICD-10-CM | POA: Diagnosis not present

## 2021-06-22 DIAGNOSIS — G20 Parkinson's disease: Secondary | ICD-10-CM | POA: Diagnosis not present

## 2021-06-22 DIAGNOSIS — R2681 Unsteadiness on feet: Secondary | ICD-10-CM | POA: Diagnosis not present

## 2021-06-22 DIAGNOSIS — R278 Other lack of coordination: Secondary | ICD-10-CM | POA: Diagnosis not present

## 2021-06-22 DIAGNOSIS — R2689 Other abnormalities of gait and mobility: Secondary | ICD-10-CM | POA: Diagnosis not present

## 2021-06-29 DIAGNOSIS — R278 Other lack of coordination: Secondary | ICD-10-CM | POA: Diagnosis not present

## 2021-06-29 DIAGNOSIS — R2681 Unsteadiness on feet: Secondary | ICD-10-CM | POA: Diagnosis not present

## 2021-06-29 DIAGNOSIS — G20 Parkinson's disease: Secondary | ICD-10-CM | POA: Diagnosis not present

## 2021-06-29 DIAGNOSIS — R2689 Other abnormalities of gait and mobility: Secondary | ICD-10-CM | POA: Diagnosis not present

## 2021-07-01 DIAGNOSIS — R2681 Unsteadiness on feet: Secondary | ICD-10-CM | POA: Diagnosis not present

## 2021-07-01 DIAGNOSIS — R278 Other lack of coordination: Secondary | ICD-10-CM | POA: Diagnosis not present

## 2021-07-01 DIAGNOSIS — R2689 Other abnormalities of gait and mobility: Secondary | ICD-10-CM | POA: Diagnosis not present

## 2021-07-01 DIAGNOSIS — G20 Parkinson's disease: Secondary | ICD-10-CM | POA: Diagnosis not present

## 2021-07-04 ENCOUNTER — Telehealth: Payer: Self-pay

## 2021-07-04 NOTE — Telephone Encounter (Signed)
Please schedule virtual for this to be discussed with Dr. Yong Channel.

## 2021-07-04 NOTE — Telephone Encounter (Signed)
See below, wife has a virtual today.

## 2021-07-04 NOTE — Telephone Encounter (Signed)
Basically try to isolate different rooms as much as possible. If they are together safest way would be n95 mask or kn95 for both. Honest truth is they have been together for several days and he has a decent chance of getting covid- should test if he develops sympoms- and if negative ok to retest 24-48 hours later

## 2021-07-04 NOTE — Telephone Encounter (Signed)
Patient declined an appointment, he is just curious on if he needs to be in quarantine and what to do to avoid getting covid.

## 2021-07-04 NOTE — Telephone Encounter (Signed)
Nurse Assessment Nurse: Verita Schneiders, RN, April Date/Time Eilene Ghazi Time): 07/03/2021 9:01:57 AM Confirm and document reason for call. If symptomatic, describe symptoms. ---Caller states she has COVID and wants to know if there is something her or husband can take. States he is\ at risk too. He has hx of Parkinson's. He does not have any symptoms currently  Does the patient have any new or worsening symptoms? ---Yes Will a triage be completed? ---Yes Related visit to physician within the last 2 weeks? ---No Does the PT have any chronic conditions? (i.e. diabetes, asthma, this includes High risk factors for pregnancy, etc.) ---Yes List chronic conditions. ---Parkinson's, edema in legs Is this a behavioral health or substance abuse call? ---No  COVID-19 - Exposure [1] COVID-19 EXPOSURE within last 14 days  [2]weak immune system (e.g., HIV positive,cancer chemo, splenectomy, organ transplant, chronic steroids)  [3] NO symptoms Beams, RN, April 07/03/2021 9:04:40 AM  07/03/2021 7:58:37 AM FINAL ATTEMPT MADE - message left Glynis Smiles 07/03/2021 7:58:55 AM Send to RN Final Attempt Elenora Gamma, RN, Mickel Baas 07/03/2021 9:08:21 AM Call PCP within 24 Hours Yes Beams, RN, April

## 2021-07-05 NOTE — Telephone Encounter (Signed)
Called and spoke with pt and gave below message. Pt states he did test yesterday and he was negative and he will call us if anything changes.

## 2021-07-06 ENCOUNTER — Telehealth (INDEPENDENT_AMBULATORY_CARE_PROVIDER_SITE_OTHER): Payer: PPO | Admitting: Family Medicine

## 2021-07-06 ENCOUNTER — Encounter: Payer: Self-pay | Admitting: Family Medicine

## 2021-07-06 VITALS — Ht 66.0 in | Wt 230.0 lb

## 2021-07-06 DIAGNOSIS — U071 COVID-19: Secondary | ICD-10-CM | POA: Diagnosis not present

## 2021-07-06 DIAGNOSIS — I10 Essential (primary) hypertension: Secondary | ICD-10-CM | POA: Diagnosis not present

## 2021-07-06 MED ORDER — MOLNUPIRAVIR EUA 200MG CAPSULE: 4.0000 | ORAL_CAPSULE | Freq: Two times a day (BID) | ORAL | 0 refills | Status: AC

## 2021-07-06 MED ORDER — MOLNUPIRAVIR EUA 200MG CAPSULE: 4.0000 | ORAL_CAPSULE | Freq: Two times a day (BID) | ORAL | 0 refills | Status: DC

## 2021-07-06 NOTE — Progress Notes (Signed)
Phone [phone] Virtual visit via phonenote   Subjective:   Chief Complaint  Patient presents with   covid positivie    Pt states he tested positive yesterday and has a " goopy cough" with brown tinge. Pt denies fever and body aches.    This visit type was conducted due to national recommendations for restrictions regarding the COVID-19 Pandemic (e.g. social distancing).  This format is felt to be most appropriate for this patient at this time balancing risks to patient and risks to population by having him in for in person visit.  All issues noted in this document were discussed and addressed.  No physical exam was performed (except for noted visual exam or audio findings with Telehealth visits).  The patient has consented to conduct a Telehealth visit and understands insurance will be billed.   Our team/I connected with Romona Curls K Peppard at 12:00 PM EST by phone (patient did not have equipment for webex) and verified that I am speaking with the correct person using two identifiers.  Location patient: [place] Location provider: [facility], office Persons participating in the virtual visit:  patient  Time on phone: 13 minutes  Our team/I discussed the limitations of evaluation and management by telemedicine and the availability of in person appointments. In light of current covid-19 pandemic, patient also understands that we are trying to protect them by minimizing in office contact if at all possible.  The patient expressed consent for telemedicine visit and agreed to proceed. Patient understands insurance will be billed.   Past Medical History-  Patient Active Problem List   Diagnosis Date Noted   Lymphedema 02/10/2017    Priority: High   Parkinson's plus syndrome ([place]) 06/22/2015    Priority: High   History of malignant melanoma. left leg 03/09/2009    Priority: High   BPH associated with nocturia 05/16/2016    Priority: Medium    OSA (obstructive sleep apnea) 12/28/2015     Priority: Medium    Diastolic dysfunction 66/12/3014    Priority: Medium    Chronic venous insufficiency 02/20/2014    Priority: Medium    Hyperglycemia 08/02/2012    Priority: Medium    Hyperlipidemia 12/13/2006    Priority: Medium    Essential hypertension 12/13/2006    Priority: Medium    Senile purpura ([place]) 05/03/2016    Priority: Low   Hypersomnia 11/15/2015    Priority: Low   Former smoker 04/29/2014    Priority: Low   Morbid obesity ([place]) 07/13/2010    Priority: Low   Insomnia 10/03/2007    Priority: Low   Parkinson's disease ([place]) 01/11/2021   Sepsis without acute organ dysfunction ([place])    Cellulitis 04/12/2020   Strain of right biceps 09/12/2017   Cough 11/15/2015   Dizziness 12/30/2014    Medications- reviewed and updated Current Outpatient Medications  Medication Sig Dispense Refill   albuterol (VENTOLIN HFA) 108 (90 Base) MCG/ACT inhaler Inhale 2 puffs into the lungs every 6 (six) hours as needed for wheezing or shortness of breath. 18 g 1   carbidopa-levodopa (SINEMET IR) 25-100 MG tablet TAKE 2 TABLETS AT 7AM,11AM, 3PM, AND AT 7PM. 720 tablet 1   Infant Care Products (DERMACLOUD) OINT Apply topically.     rosuvastatin (CRESTOR) 10 MG tablet TAKE ONE TABLET BY MOUTH DAILY 90 tablet 0   molnupiravir EUA (LAGEVRIO) 200 mg CAPS capsule Take 4 capsules (800 mg total) by mouth 2 (two) times daily for 5 days. 40 capsule 0   No current  facility-administered medications for this visit.     Objective:  Ht 5\' 6"  (1.676 m)    Wt 230 lb (104.3 kg)    BMI 37.12 kg/m  self reported vitals  Nonlabored voice, normal speech       Assessment and Plan   # COVID +  S:patients wife had covid. He tested yesterday and was positive. Started coughing on Saturday. Brown tinge to sputum with no blood. No fever or body aches. No headaches. Slight nasal congestion but saline solution helps. No chills. Some runny nose. Some fatigue. Diarrhea last night. No sob. Appetite  normal A/P: Patient with testing confirming covid 19 with first day of covid 19 symptoms saturday the 10th. Wife had been ill with covid Vaccination status:no vaccination yet this year- has not had bivalent   Therefore: - recommended patient watch closely for shortness of breath or confusion or worsening symptoms and if those occur patient should contact us immediately or seek care in the emergency department -advised to stay well hydrated -recommended patient consider purchasing pulse oximeter and if levels 94% or below persistently- seek care at the hospital - Patient needs to self isolate  for at least 5 days since first symptom AND at least 24 hours fever free without fever reducing medications AND have improvement in respiratory symptoms . After 5 days can end self isolation but still needs to wear mask for additional 5 days .  -Patient should inform close contacts about exposure (anyone patient been around unmasked for more than 15 minutes)   If High risk for complications-we discussed outpatient therapeutic options including paxlovid, molnupiravir(and risk of rebound with both and both EUA authorization - patient opted molnupiravir as preferred not to hold his statin plus wife is tolerating same med for her covid   Recommended follow up:  Future Appointments  Date Time Provider [place]  07/26/2021  3:00 PM [name], [name], DO [facility] None  11/22/2021  1:40 PM [name], MD [facility]  06/05/2022  2:30 PM [facility] HEALTH COACH [facility]    Lab/Order associations:   ICD-10-CM   1. COVID  U07.1     2. Essential hypertension  I10       Meds ordered this encounter  Medications   DISCONTD: molnupiravir EUA (LAGEVRIO) 200 mg CAPS capsule    Sig: Take 4 capsules (800 mg total) by mouth 2 (two) times daily for 5 days.    Dispense:  40 capsule    Refill:  0   molnupiravir EUA (LAGEVRIO) 200 mg CAPS capsule    Sig: Take 4 capsules (800 mg total) by mouth 2 (two)  times daily for 5 days.    Dispense:  40 capsule    Refill:  0   I,Jada Dominion,acting as a scribe for [name], MD.,have documented all relevant documentation on the behalf of [name], MD,as directed by  [name], MD while in the presence of [name], MD.  I, [name], MD, have reviewed all documentation for this visit. The documentation on 07/06/21 for the exam, diagnosis, procedures, and orders are all accurate and complete.  Return precautions advised.  [name], MD

## 2021-07-13 NOTE — Progress Notes (Signed)
Assessment/Plan:   1.  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  Discussed ST. he was agreeable to referral.  -Really should not be transferring at all by himself.  The falls have occurred in the transfers.  He has no [employer] fast streamer.  May need more caregivers in the home.   2.  History of melanoma  -Follows with dermatology.  Understands that parkinsonism does increase risk for melanoma as well.  Has appt in Feb  3.  Lymphedema  -Follows with wound care.  His leg, particularly the right leg, is markedly improved compared to previous.  4.  Sialorrhea  -pt not ready for botox  Subjective:   Nicholas [name] was seen today in follow up for parkinsonism.  My previous records were reviewed prior to todays visit as well as outside records available to me.  Patient with wife who supplements the history.  Patient went to [facility] since our last visit for rehab.  This was primarily for lymphedema treatment.  Records from Dr. Yong Channel indicate that patient was down nearly 50 pounds post treatment at [facility].  Following treatment at [facility], patient also did much better in terms of his physical status and being able to get up and down.  Patient had someone come in to the house from [facility] and was really very happy with the treatment that he got.  He does have trouble transferring in the home and has fallen because of transfers.  He has a caregiver in the day but not in the evening/night.  The fire department has had to come and help him get up.  Patient did have COVID last month.  Saw Dr. Yong Channel for it on December 14.  Does c/o drooling but not ready for botox.  C/o leaning to the right.  Current prescribed movement disorder medications: Carbidopa/levodopa 25/100, 2 tablets 4 times per day    ALLERGIES:   Allergies  Allergen Reactions   Amoxicillin Swelling    Presumed  angioedema of lips due to prednisone    CURRENT MEDICATIONS:  Outpatient Encounter Medications as of 07/26/2021  Medication Sig   albuterol (VENTOLIN HFA) 108 (90 Base) MCG/ACT inhaler Inhale 2 puffs into the lungs every 6 (six) hours as needed for wheezing or shortness of breath.   carbidopa-levodopa (SINEMET IR) 25-100 MG tablet TAKE 2 TABLETS AT 7AM,11AM, 3PM, AND AT 7PM.   Infant Care Products (DERMACLOUD) OINT Apply topically.   rosuvastatin (CRESTOR) 10 MG tablet TAKE ONE TABLET BY MOUTH DAILY   [DISCONTINUED] carbidopa-levodopa (SINEMET IR) 25-100 MG tablet TAKE 2 TABLETS AT 7AM,11AM, 3PM, AND AT 7PM.   No facility-administered encounter medications on file as of 07/26/2021.    Objective:   PHYSICAL EXAMINATION:    VITALS:   Vitals:   07/26/21 1456  BP: 126/78  Pulse: 76  SpO2: 95%  Weight: 230 lb (104.3 kg)  Height: 5\' 7"  (1.702 m)      GEN:  The patient appears stated age and is in NAD. HEENT:  Normocephalic, atraumatic.  The mucous membranes are moist. The superficial temporal arteries are without ropiness or tenderness. CV:  RRR Lungs:  CTAB Neck/HEME:  There are no carotid bruits bilaterally.  Neurological examination:  Orientation: The patient is alert and oriented x3. Cranial nerves: There is good facial symmetry with facial hypomimia. EOMI with the exception of upgaze paresis.  There is square wave jerks.  The speech  is fluent and clear but has fairly significant hypophonia.  Soft palate rises symmetrically and there is no tongue deviation. Hearing is intact to conversational tone. Sensation: Sensation is intact to light touch throughout Motor: Strength is at least antigravity x4.  Movement examination: Tone: There is mild increased tone in the [place] (same as previous) Abnormal movements: none Coordination:  There is no  decremation with RAM's, with hand opening and closing and finger taps bilaterally Gait and Station: Not tested today due to  instability/wheelchair-bound.  He is leaned back in his electric wheelchair  I have reviewed and interpreted the following labs independently    Chemistry      Component Value Date/Time   NA 141 05/24/2021 1338   K 4.2 05/24/2021 1338   CL 106 05/24/2021 1338   CO2 28 05/24/2021 1338   BUN 22 05/24/2021 1338   CREATININE 0.87 05/24/2021 1338   CREATININE 1.01 10/03/2019 1517      Component Value Date/Time   CALCIUM 9.0 05/24/2021 1338   ALKPHOS 82 05/24/2021 1338   AST 9 05/24/2021 1338   ALT 3 05/24/2021 1338   BILITOT 0.4 05/24/2021 1338       Lab Results  Component Value Date   WBC 8.1 05/24/2021   HGB 14.0 05/24/2021   HCT 43.2 05/24/2021   MCV 88.6 05/24/2021   PLT 188.0 05/24/2021    Lab Results  Component Value Date   TSH 1.42 06/19/2018     Total time spent on today's visit was 34 minutes, including both face-to-face time and nonface-to-face time.  Time included that spent on review of records (prior notes available to me/labs/imaging if pertinent), discussing treatment and goals, answering patient's questions and coordinating care.  Cc:  [name], MD

## 2021-07-14 ENCOUNTER — Telehealth (INDEPENDENT_AMBULATORY_CARE_PROVIDER_SITE_OTHER): Payer: PPO | Admitting: Physician Assistant

## 2021-07-14 DIAGNOSIS — R0981 Nasal congestion: Secondary | ICD-10-CM | POA: Diagnosis not present

## 2021-07-14 DIAGNOSIS — R051 Acute cough: Secondary | ICD-10-CM | POA: Diagnosis not present

## 2021-07-14 DIAGNOSIS — U071 COVID-19: Secondary | ICD-10-CM

## 2021-07-14 MED ORDER — PREDNISONE 20 MG PO TABS: 20.0000 mg | ORAL_TABLET | Freq: Two times a day (BID) | ORAL | 0 refills | Status: AC

## 2021-07-14 NOTE — Progress Notes (Signed)
Virtual Visit via Video Note  I connected with  Nicholas [name]  on 07/14/21 at  1:30 PM EST by a video enabled telemedicine application and verified that I am speaking with the correct person using two identifiers.  Location: Patient: home Provider: Therapist, music at [place] present: Patient, his wife, and myself   I discussed the limitations of evaluation and management by telemedicine and the availability of in person appointments. The patient expressed understanding and agreed to proceed.   History of Present Illness:  Chief complaint: Post-COVID-19 cough and congestion Symptom onset: 07/02/21 Pertinent positives: Nasal congestion, productive cough yellow phlegm Pertinent negatives: SOB, ST, CP, headache, chest tightness, fever, body aches  Treatments tried: Molnupiravir, Tylenol     Observations/Objective:   Gen: Awake, alert, no acute distress Resp: Breathing is even and non-labored Psych: calm/pleasant demeanor Neuro: Alert and Oriented x 3, + facial symmetry, speech is clear.   Assessment and Plan:  1. COVID-19 2. Acute cough 3. Nasal congestion -Majority of history provided by patient's wife today  -Pt has persistent cough and congestion s/p COVID-19; no SOB, fever, chest tightness, change in sputum, indicating need for CXR at this time  -May benefit from burst of prednisone at this time, Rx sent in for prednisone 20 mg BID x 5 days  -Encouraged to push fluids, run humidifier in bedroom at night -He will continue to keep [place] updated and f/up in person as needed   Follow Up Instructions:    I discussed the assessment and treatment plan with the patient. The patient was provided an opportunity to ask questions and all were answered. The patient agreed with the plan and demonstrated an understanding of the instructions.   The patient was advised to call back or seek an in-person evaluation if the symptoms worsen or if the condition fails  to improve as anticipated.  Video connection was lost at >50% of the duration of the visit, at which time the remainder of the visit was completed via audio only.  Total time spent on the phone was 12 minutes and 52 seconds.  Vaniya Augspurger M Claris Pech, PA-C

## 2021-07-18 ENCOUNTER — Other Ambulatory Visit: Payer: Self-pay | Admitting: Neurology

## 2021-07-18 DIAGNOSIS — G20 Parkinson's disease: Secondary | ICD-10-CM

## 2021-07-19 ENCOUNTER — Other Ambulatory Visit: Payer: Self-pay

## 2021-07-20 ENCOUNTER — Encounter: Payer: Self-pay | Admitting: Physician Assistant

## 2021-07-20 ENCOUNTER — Telehealth (INDEPENDENT_AMBULATORY_CARE_PROVIDER_SITE_OTHER): Payer: PPO | Admitting: Physician Assistant

## 2021-07-20 VITALS — Ht 66.0 in | Wt 230.0 lb

## 2021-07-20 DIAGNOSIS — R278 Other lack of coordination: Secondary | ICD-10-CM | POA: Diagnosis not present

## 2021-07-20 DIAGNOSIS — R2689 Other abnormalities of gait and mobility: Secondary | ICD-10-CM | POA: Diagnosis not present

## 2021-07-20 DIAGNOSIS — R052 Subacute cough: Secondary | ICD-10-CM | POA: Diagnosis not present

## 2021-07-20 DIAGNOSIS — G20 Parkinson's disease: Secondary | ICD-10-CM | POA: Diagnosis not present

## 2021-07-20 DIAGNOSIS — R2681 Unsteadiness on feet: Secondary | ICD-10-CM | POA: Diagnosis not present

## 2021-07-20 NOTE — Progress Notes (Signed)
Virtual Visit via Video Note   I, Nicholas [name], connected with  Nicholas [name]  ([id number], 06/27/1946) on 07/20/21 at  2:00 PM EST by a video-enabled telemedicine application and verified that I am speaking with the correct person using two identifiers.  Location: Patient: Virtual Visit Location Patient: Home Provider: Virtual Visit Location Provider: Office/Clinic   I discussed the limitations of evaluation and management by telemedicine and the availability of in person appointments. The patient expressed understanding and agreed to proceed.    History of Present Illness: [name] is a 75 y.o. who identifies as a male who was assigned male at birth, and is being seen today for cough.  Cough/congestion Patient tested positive for COVID-19 on 07/12/21. He took molnupiravir as instructed by his PCP  He did a follow-up virtual visit with our office on 07/14/21 for post-COVID-19 cough and congestion. He had significant productive cough and was given prednisone 20 mg BID x 5 days.   When patient lays down he has significant coughing. Denies: chest pain. Has SOB when coughing. Using Mucinex lozenges. Denies further fever, chills. Appetite is ok and staying dehydrated. Biggest issue is getting sleep at night due to cough up white phlegm.   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 left leg 03/09/2009   Insomnia 10/03/2007   Hyperlipidemia 12/13/2006   Essential hypertension 12/13/2006    Allergies:  Allergies  Allergen Reactions   Amoxicillin Swelling    Presumed angioedema of lips due to prednisone   Medications:  Current Outpatient Medications:    albuterol (VENTOLIN HFA) 108 (90 Base) MCG/ACT inhaler, Inhale 2 puffs into the lungs every 6 (six) hours as needed for wheezing or shortness of breath., Disp: 18 g, Rfl: 1   carbidopa-levodopa (SINEMET IR) 25-100 MG tablet, TAKE 2 TABLETS AT 7AM,11AM, 3PM, AND AT 7PM., Disp: 720 tablet, Rfl: 0   Infant Care Products (DERMACLOUD) OINT, Apply topically., Disp: , Rfl:    rosuvastatin (CRESTOR) 10 MG tablet, TAKE ONE TABLET BY MOUTH DAILY, Disp: 90 tablet, Rfl: 0  Observations/Objective: Patient is well-developed, well-nourished in no acute distress.  Resting comfortably  at home.  Head is normocephalic, atraumatic.  No labored breathing.  Speech is clear and coherent with logical content.  Patient is alert and oriented at baseline.    Assessment and Plan: 1. Subacute cough No red flags on discussion Suspect post-viral cough Patient appears well Will trial OTC DM and/or mucinex Follow-up in office with me on Friday (appt scheduled) for in office exam and vitals May consider abx at that time if indicated Worsening precautions discussed in the interim  Follow Up Instructions: I discussed the assessment and treatment plan with the patient. The patient was provided an opportunity to ask questions and all were answered. The patient agreed with the plan and demonstrated an understanding of the instructions.  A copy of instructions  were sent to the patient via MyChart unless otherwise noted below.   The patient was advised to call back or seek an in-person evaluation if the symptoms worsen or if the condition fails to improve as anticipated.   Nicholas [name], [place]

## 2021-07-21 DIAGNOSIS — G20 Parkinson's disease: Secondary | ICD-10-CM | POA: Diagnosis not present

## 2021-07-21 DIAGNOSIS — R278 Other lack of coordination: Secondary | ICD-10-CM | POA: Diagnosis not present

## 2021-07-21 DIAGNOSIS — R2689 Other abnormalities of gait and mobility: Secondary | ICD-10-CM | POA: Diagnosis not present

## 2021-07-21 DIAGNOSIS — R2681 Unsteadiness on feet: Secondary | ICD-10-CM | POA: Diagnosis not present

## 2021-07-22 ENCOUNTER — Telehealth: Payer: PPO | Admitting: Physician Assistant

## 2021-07-22 NOTE — Progress Notes (Incomplete)
Nicholas [name] is a 75 y.o. male here for a follow up of congestion.   SCRIBE STATEMENT  History of Present Illness:   No chief complaint on file.   HPI Congestion On 07/05/21 pt tested positive for covid 19 after experiencing a "goopy cough" with brown tinge. States that his wife had been ill with covid for a couple of days prior to his onset of sx. Due to positive result, he followed up with Dr. Yong Channel, his PCP via video visit and was prescribed molnupiravir 800 mg twice daily x 5 days.   During his follow up visit on 07/14/21 with Alyssa Allwardt,PA via video visit, he reported he had been compliant with his medication despite still testing positive on 07/12/21. Although he completed the medication, he stated he was still experiencing a persistent cough with sputum and nasal congestion. Due to the nature of his sx during this visit, he was prescribed prednisone 20 mg BID x 5 days as well as encouraged to push fluids and use a humidifier at night.   As time went on, pt began feeling as though his sx were worsening although he was compliant with the medication given. During our virtual visit on 07/20/21, he stated he felt SOB when coughing and could not lay down without experiencing significant coughing. In an effort to manage his sx, he had been using mucinex lozenges which provided minor relief. Following this visit, I recommended use of OTC Dextromethorphan such as Delsym and/or mucinex.   Currently he is ***.    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    Hyperlipidemia    Hypertension    Lymphedema      Social History   Tobacco Use   Smoking status: Former    Packs/day: 1.00    Years: 16.00    Pack years: 16.00    Types: Cigarettes    Quit date: 12/22/1993    Years since quitting: 27.6   Smokeless tobacco: Never  Vaping Use   Vaping Use: Never used  Substance Use Topics   Alcohol use: No     Alcohol/week: 21.0 standard drinks    Types: 21 Standard drinks or equivalent per week   Drug use: No    Past Surgical History:  Procedure Laterality Date   4th toe- 2nd primary melanoma  2009   removal of melanoma of left foot/amputation of toes 1&25 Jul 2006   WRIST FRACTURE SURGERY     75 years old-set    Family History  Problem Relation Age of Onset   Hypertension Father    CVA Father        age 90   Hyperlipidemia Father    Other Mother        Deceased   Healthy Sister    Healthy Brother     Allergies  Allergen Reactions   Amoxicillin Swelling    Presumed angioedema of lips due to prednisone    Current Medications:   Current Outpatient Medications:    albuterol (VENTOLIN HFA) 108 (90 Base) MCG/ACT inhaler, Inhale 2 puffs into the lungs every 6 (six) hours as needed for wheezing or shortness of breath., Disp: 18 g, Rfl: 1   carbidopa-levodopa (SINEMET IR) 25-100 MG tablet, TAKE 2 TABLETS AT 7AM,11AM, 3PM, AND AT 7PM., Disp: 720 tablet, Rfl: 0   Infant Care Products (DERMACLOUD) OINT, Apply topically., Disp: , Rfl:  rosuvastatin (CRESTOR) 10 MG tablet, TAKE ONE TABLET BY MOUTH DAILY, Disp: 90 tablet, Rfl: 0   Review of Systems:   ROS  Vitals:   There were no vitals filed for this visit.   There is no height or weight on file to calculate BMI.  Physical Exam:   Physical Exam  Assessment and Plan:       ***  Inda Coke, PA-C

## 2021-07-25 DIAGNOSIS — Z20822 Contact with and (suspected) exposure to covid-19: Secondary | ICD-10-CM | POA: Diagnosis not present

## 2021-07-26 ENCOUNTER — Ambulatory Visit: Payer: PPO | Admitting: Neurology

## 2021-07-26 ENCOUNTER — Other Ambulatory Visit: Payer: Self-pay

## 2021-07-26 ENCOUNTER — Encounter: Payer: Self-pay | Admitting: Neurology

## 2021-07-26 VITALS — BP 126/78 | HR 76 | Ht 67.0 in | Wt 230.0 lb

## 2021-07-26 DIAGNOSIS — G20 Parkinson's disease: Secondary | ICD-10-CM

## 2021-07-28 DIAGNOSIS — R278 Other lack of coordination: Secondary | ICD-10-CM | POA: Diagnosis not present

## 2021-07-28 DIAGNOSIS — R2689 Other abnormalities of gait and mobility: Secondary | ICD-10-CM | POA: Diagnosis not present

## 2021-07-28 DIAGNOSIS — R2681 Unsteadiness on feet: Secondary | ICD-10-CM | POA: Diagnosis not present

## 2021-07-28 DIAGNOSIS — G20 Parkinson's disease: Secondary | ICD-10-CM | POA: Diagnosis not present

## 2021-07-29 ENCOUNTER — Telehealth: Payer: Self-pay | Admitting: Family Medicine

## 2021-07-29 DIAGNOSIS — G20 Parkinson's disease: Secondary | ICD-10-CM | POA: Diagnosis not present

## 2021-07-29 DIAGNOSIS — R2689 Other abnormalities of gait and mobility: Secondary | ICD-10-CM | POA: Diagnosis not present

## 2021-07-29 DIAGNOSIS — R2681 Unsteadiness on feet: Secondary | ICD-10-CM | POA: Diagnosis not present

## 2021-07-29 DIAGNOSIS — R278 Other lack of coordination: Secondary | ICD-10-CM | POA: Diagnosis not present

## 2021-07-29 NOTE — Telephone Encounter (Signed)
[facility] been seeing for PT/OT. 3 new wounds for buttocks- sending nurse out for eval- im ok with verbal orders for [facility].   If he needs home health face to face for this will need to be scheduled.

## 2021-08-01 DIAGNOSIS — Z20822 Contact with and (suspected) exposure to covid-19: Secondary | ICD-10-CM | POA: Diagnosis not present

## 2021-08-04 DIAGNOSIS — G4710 Hypersomnia, unspecified: Secondary | ICD-10-CM | POA: Diagnosis not present

## 2021-08-04 DIAGNOSIS — Z87891 Personal history of nicotine dependence: Secondary | ICD-10-CM | POA: Diagnosis not present

## 2021-08-04 DIAGNOSIS — N39498 Other specified urinary incontinence: Secondary | ICD-10-CM | POA: Diagnosis not present

## 2021-08-04 DIAGNOSIS — I872 Venous insufficiency (chronic) (peripheral): Secondary | ICD-10-CM | POA: Diagnosis not present

## 2021-08-04 DIAGNOSIS — L89312 Pressure ulcer of right buttock, stage 2: Secondary | ICD-10-CM | POA: Diagnosis not present

## 2021-08-04 DIAGNOSIS — N401 Enlarged prostate with lower urinary tract symptoms: Secondary | ICD-10-CM | POA: Diagnosis not present

## 2021-08-04 DIAGNOSIS — Z9181 History of falling: Secondary | ICD-10-CM | POA: Diagnosis not present

## 2021-08-04 DIAGNOSIS — G20 Parkinson's disease: Secondary | ICD-10-CM | POA: Diagnosis not present

## 2021-08-04 DIAGNOSIS — E785 Hyperlipidemia, unspecified: Secondary | ICD-10-CM | POA: Diagnosis not present

## 2021-08-04 DIAGNOSIS — R351 Nocturia: Secondary | ICD-10-CM | POA: Diagnosis not present

## 2021-08-04 DIAGNOSIS — D692 Other nonthrombocytopenic purpura: Secondary | ICD-10-CM | POA: Diagnosis not present

## 2021-08-04 DIAGNOSIS — K117 Disturbances of salivary secretion: Secondary | ICD-10-CM | POA: Diagnosis not present

## 2021-08-04 DIAGNOSIS — I10 Essential (primary) hypertension: Secondary | ICD-10-CM | POA: Diagnosis not present

## 2021-08-04 DIAGNOSIS — Z85828 Personal history of other malignant neoplasm of skin: Secondary | ICD-10-CM | POA: Diagnosis not present

## 2021-08-04 DIAGNOSIS — I890 Lymphedema, not elsewhere classified: Secondary | ICD-10-CM | POA: Diagnosis not present

## 2021-08-04 DIAGNOSIS — G4700 Insomnia, unspecified: Secondary | ICD-10-CM | POA: Diagnosis not present

## 2021-08-04 DIAGNOSIS — Z6841 Body Mass Index (BMI) 40.0 and over, adult: Secondary | ICD-10-CM | POA: Diagnosis not present

## 2021-08-04 DIAGNOSIS — G4733 Obstructive sleep apnea (adult) (pediatric): Secondary | ICD-10-CM | POA: Diagnosis not present

## 2021-08-04 DIAGNOSIS — L89322 Pressure ulcer of left buttock, stage 2: Secondary | ICD-10-CM | POA: Diagnosis not present

## 2021-08-04 DIAGNOSIS — Z8616 Personal history of COVID-19: Secondary | ICD-10-CM | POA: Diagnosis not present

## 2021-08-05 ENCOUNTER — Other Ambulatory Visit: Payer: PPO | Admitting: Hospice

## 2021-08-05 ENCOUNTER — Other Ambulatory Visit: Payer: Self-pay

## 2021-08-05 DIAGNOSIS — G20 Parkinson's disease: Secondary | ICD-10-CM | POA: Diagnosis not present

## 2021-08-05 DIAGNOSIS — I890 Lymphedema, not elsewhere classified: Secondary | ICD-10-CM | POA: Diagnosis not present

## 2021-08-05 DIAGNOSIS — Z515 Encounter for palliative care: Secondary | ICD-10-CM

## 2021-08-05 NOTE — Progress Notes (Signed)
[facility] Palliative Care Consult Note Telephone: [phone]  Fax: [phone]  PATIENT NAME: [place] [id number] [phone] (home)  DOB: [birth year] MRN: [id number]  PRIMARY CARE PROVIDER:    [name], MD,  Nicholas [name] [address] [phone]  REFERRING PROVIDER:   Marin [name], [place],  [place] [address] [phone]  RESPONSIBLE PARTY:   Self/Spouse [phone] - [name] - best number to call [name] - caregiver Pet is [place]     Name Relation Home Work Beaver Spouse [phone]  [phone]       I met face to face with patient and family at home. Palliative Care was asked to follow this patient by consultation request of  Nicholas Olp, MD to address advance care planning, complex medical decision making and goals of care clarification. [name] is at home with patient during visit. Caregiver - [name] is with patient during visit. This is a follow up visit.    ASSESSMENT AND / RECOMMENDATIONS:   CODE STATUS:Patient is a Do Not Resuscitate.   Goals of Care: Goals include to maximize quality of life and symptom management.  Symptom Management/Plan: Parkinson Disease: Visit with Neurologist 07/26/21. No changes to medications. ST consult put in by Dr [name] for patient's voice beginning to be hypophonic. Continue Sinemet and scheduled appointments with Neurologist.  Continue physical activity as tolerated; pt uses stand lift to help with standing and transitioning to wheelchair. Fall precautions discussed. Lymphedema: related to Melanoma on his left feet; improving. Ray - Lymphatic Specialist with [facility] follows patient at home to massage and wrap his bil lower extremities.   Follow up: Palliative care will continue to follow for complex medical decision making, advance care planning, and clarification  of goals. Return 6 weeks or prn.Encouraged to call provider sooner with any concerns.   Family /Caregiver/Community Supports: Patient lives at home with spouse. He has caregivers that come to help patient in ADLs.   HOSPICE ELIGIBILITY/DIAGNOSIS: TBD  Chief Complaint: Follow up visit  HISTORY OF PRESENT ILLNESS:  Nicholas [name] is a 76 y.o. 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 Patient denies pain/discomfort, in no acute distress; no hospitalization since last visit. History of Melanoma, Lymphedema.  History obtained from review of EMR, discussion with primary team, caregiver, family and/or Nicholas [name].  Review and summarization of Epic records shows history from other than patient. Rest of 10 point ROS asked and negative.  I reviewed, as needed, available labs, patient records, imaging, studies and related documents from the EMR.  ROS General: NAD EYES: denies vision changes ENMT: denies dysphagia Cardiovascular: denies chest pain/discomfort Pulmonary: denies cough, denies SOB Abdomen: endorses good appetite, denies constipation/diarrhea GU: denies dysuria, urinary frequency MSK:  endorses weakness,  no falls reported, Skin: denies rashes or wounds, endorses lymphedema in BLE Neurological: denies pain, denies insomnia Psych: Endorses positive mood Heme/lymph/immuno: denies bruises, abnormal bleeding  Physical Exam:  Constitutional: NAD General: Well groomed, cooperative EYES: anicteric sclera, lids intact, no discharge  ENMT: Moist mucous membrane CV: S1 S2, RRR Pulmonary: LCTA, no increased work of breathing, no cough, Abdomen: active BS + 4 quadrants, soft and non tender GU: no suprapubic tenderness MSK: weakness, limited ROM, lymphedema in  BL extremities  improving, Velcro wrap in place.  Skin: warm and dry, no rashes or wounds on visible skin Neuro:  weakness, otherwise non focal, oriented x 3 Psych: non-anxious affect Hem/lymph/immuno: no widespread bruising   PAST MEDICAL HISTORY:  Active Ambulatory Problems    Diagnosis Date Noted   History of malignant melanoma. 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   Hypersomnia 11/15/2015   Cough 11/15/2015   OSA (obstructive sleep apnea) 12/28/2015   Senile purpura ([place]) 05/03/2016   BPH associated with nocturia 05/16/2016   Lymphedema 02/10/2017   Strain of right biceps 09/12/2017   Cellulitis 04/12/2020   Sepsis without acute organ dysfunction ([place])    Parkinson's disease ([place]) 01/11/2021   Resolved Ambulatory Problems    Diagnosis Date Noted   Chronic venous insufficiency 11/20/2014   Past Medical History:  Diagnosis Date   Cancer [facility]) Jan 2008   Hypertension     SOCIAL HX:  Social History   Tobacco Use   Smoking status: Former    Packs/day: 1.00    Years: 16.00    Pack years: 16.00    Types: Cigarettes    Quit date: 12/22/1993    Years since quitting: 27.6   Smokeless tobacco: Never  Substance Use Topics   Alcohol use: No    Alcohol/week: 21.0 standard drinks    Types: 21 Standard drinks or equivalent per week     FAMILY HX:  Family History  Problem Relation Age of Onset   Hypertension Father    CVA Father        age 15   Hyperlipidemia Father    Other Mother        Deceased   Healthy Sister    Healthy Brother       ALLERGIES:  Allergies  Allergen Reactions   Amoxicillin Swelling    Presumed angioedema of lips due to prednisone      PERTINENT  MEDICATIONS:  Outpatient Encounter Medications as of 08/05/2021  Medication Sig   albuterol (VENTOLIN HFA) 108 (90 Base) MCG/ACT inhaler Inhale 2 puffs into the lungs every 6 (six) hours as needed for wheezing or shortness of breath.   carbidopa-levodopa (SINEMET IR) 25-100 MG tablet TAKE 2 TABLETS AT 7AM,11AM, 3PM, AND AT 7PM.   Infant Care Products (DERMACLOUD) OINT Apply topically.   rosuvastatin (CRESTOR) 10 MG tablet TAKE ONE TABLET BY MOUTH DAILY   No facility-administered encounter medications on file as of 08/05/2021.  I spent 40 minutes providing this consultation; time includes spent with patient/family, chart review and documentation. More than 50% of the time in this consultation was spent on care coordination Thank you for the opportunity to participate in the care of Nicholas [name].  The palliative care team will continue to follow. Please call our office at [phone] if we can be of additional assistance.   Note: Portions of this note were generated with Lobbyist. Dictation errors may occur despite best attempts at proofreading.  Teodoro Spray, NP

## 2021-08-08 DIAGNOSIS — Z20822 Contact with and (suspected) exposure to covid-19: Secondary | ICD-10-CM | POA: Diagnosis not present

## 2021-08-09 ENCOUNTER — Telehealth: Payer: Self-pay

## 2021-08-09 DIAGNOSIS — G4710 Hypersomnia, unspecified: Secondary | ICD-10-CM | POA: Diagnosis not present

## 2021-08-09 DIAGNOSIS — L89312 Pressure ulcer of right buttock, stage 2: Secondary | ICD-10-CM | POA: Diagnosis not present

## 2021-08-09 DIAGNOSIS — L89322 Pressure ulcer of left buttock, stage 2: Secondary | ICD-10-CM | POA: Diagnosis not present

## 2021-08-09 DIAGNOSIS — Z8616 Personal history of COVID-19: Secondary | ICD-10-CM | POA: Diagnosis not present

## 2021-08-09 DIAGNOSIS — G4733 Obstructive sleep apnea (adult) (pediatric): Secondary | ICD-10-CM | POA: Diagnosis not present

## 2021-08-09 DIAGNOSIS — N39498 Other specified urinary incontinence: Secondary | ICD-10-CM | POA: Diagnosis not present

## 2021-08-09 DIAGNOSIS — I890 Lymphedema, not elsewhere classified: Secondary | ICD-10-CM | POA: Diagnosis not present

## 2021-08-09 DIAGNOSIS — I10 Essential (primary) hypertension: Secondary | ICD-10-CM | POA: Diagnosis not present

## 2021-08-09 DIAGNOSIS — Z87891 Personal history of nicotine dependence: Secondary | ICD-10-CM | POA: Diagnosis not present

## 2021-08-09 DIAGNOSIS — K117 Disturbances of salivary secretion: Secondary | ICD-10-CM | POA: Diagnosis not present

## 2021-08-09 DIAGNOSIS — R351 Nocturia: Secondary | ICD-10-CM | POA: Diagnosis not present

## 2021-08-09 DIAGNOSIS — Z9181 History of falling: Secondary | ICD-10-CM | POA: Diagnosis not present

## 2021-08-09 DIAGNOSIS — G4700 Insomnia, unspecified: Secondary | ICD-10-CM | POA: Diagnosis not present

## 2021-08-09 DIAGNOSIS — I872 Venous insufficiency (chronic) (peripheral): Secondary | ICD-10-CM | POA: Diagnosis not present

## 2021-08-09 DIAGNOSIS — N401 Enlarged prostate with lower urinary tract symptoms: Secondary | ICD-10-CM | POA: Diagnosis not present

## 2021-08-09 DIAGNOSIS — D692 Other nonthrombocytopenic purpura: Secondary | ICD-10-CM | POA: Diagnosis not present

## 2021-08-09 DIAGNOSIS — E785 Hyperlipidemia, unspecified: Secondary | ICD-10-CM | POA: Diagnosis not present

## 2021-08-09 DIAGNOSIS — G20 Parkinson's disease: Secondary | ICD-10-CM | POA: Diagnosis not present

## 2021-08-09 DIAGNOSIS — Z6841 Body Mass Index (BMI) 40.0 and over, adult: Secondary | ICD-10-CM | POA: Diagnosis not present

## 2021-08-09 DIAGNOSIS — Z85828 Personal history of other malignant neoplasm of skin: Secondary | ICD-10-CM | POA: Diagnosis not present

## 2021-08-09 NOTE — Telephone Encounter (Signed)
Patient scheduled 11/9

## 2021-08-09 NOTE — Telephone Encounter (Signed)
Home Health Verbal Orders  Agency: [facility] Call back number [phone]  Contact and title  Requesting OT/ PT/ Skilled nursing/ Social Work/ Speech:  PT and Speech therapy eval  Frequency:  1 time a week for 1 week 2 times a week for 4 weeks  1 time a week for 3 weeks  HH needs F2F w/in last 30 days

## 2021-08-09 NOTE — Telephone Encounter (Signed)
Please schedule pt face to face so that the home health orders can be signed and physical therapy can be approved/continued.

## 2021-08-11 ENCOUNTER — Encounter: Payer: Self-pay | Admitting: Family Medicine

## 2021-08-11 ENCOUNTER — Other Ambulatory Visit: Payer: Self-pay

## 2021-08-11 ENCOUNTER — Telehealth (INDEPENDENT_AMBULATORY_CARE_PROVIDER_SITE_OTHER): Payer: PPO | Admitting: Family Medicine

## 2021-08-11 VITALS — Ht 67.0 in | Wt 230.0 lb

## 2021-08-11 DIAGNOSIS — I890 Lymphedema, not elsewhere classified: Secondary | ICD-10-CM

## 2021-08-11 DIAGNOSIS — N401 Enlarged prostate with lower urinary tract symptoms: Secondary | ICD-10-CM

## 2021-08-11 DIAGNOSIS — L8940 Pressure ulcer of contiguous site of back, buttock and hip, unspecified stage: Secondary | ICD-10-CM | POA: Diagnosis not present

## 2021-08-11 DIAGNOSIS — E785 Hyperlipidemia, unspecified: Secondary | ICD-10-CM

## 2021-08-11 DIAGNOSIS — R351 Nocturia: Secondary | ICD-10-CM | POA: Diagnosis not present

## 2021-08-11 DIAGNOSIS — D692 Other nonthrombocytopenic purpura: Secondary | ICD-10-CM

## 2021-08-11 DIAGNOSIS — G232 Striatonigral degeneration: Secondary | ICD-10-CM | POA: Diagnosis not present

## 2021-08-11 DIAGNOSIS — I10 Essential (primary) hypertension: Secondary | ICD-10-CM | POA: Diagnosis not present

## 2021-08-11 NOTE — Patient Instructions (Signed)
Health Maintenance Due  Topic Date Due   COVID-19 Vaccine (4 - Booster for Pfizer series) 08/26/2020    Recommended follow up: No follow-ups on file.

## 2021-08-11 NOTE — Progress Notes (Signed)
Phone [phone] Virtual visit via Video note   Subjective:  Chief complaint: Chief Complaint  Patient presents with   Follow-up    [name] and buttocks wound   This visit type was conducted due to national recommendations for restrictions regarding the COVID-19 Pandemic (e.g. social distancing).  This format is felt to be most appropriate for this patient at this time balancing risks to patient and risks to population by having him in for in person visit.  No physical exam was performed (except for noted visual exam or audio findings with Telehealth visits).    Our team/I connected with Nicholas [name] at  3:40 PM EST by a video enabled telemedicine application (doxy.me or caregility through epic) and verified that I am speaking with the correct person using two identifiers.  Location patient: Home-[facility] Location provider: [facility], office Persons participating in the virtual visit:  patient  Our team/I discussed the limitations of evaluation and management by telemedicine and the availability of in person appointments. In light of current covid-19 pandemic, patient also understands that we are trying to protect them by minimizing in office contact if at all possible.  The patient expressed consent for telemedicine visit and agreed to proceed. Patient understands insurance will be billed.   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 left leg 03/09/2009    Priority: High   BPH associated with nocturia 05/16/2016    Priority: Medium    OSA (obstructive sleep apnea) 12/28/2015    Priority: Medium    Diastolic dysfunction 56/81/2751    Priority: Medium    Chronic venous insufficiency 02/20/2014    Priority: Medium    Hyperglycemia 08/02/2012    Priority: Medium    Hyperlipidemia 12/13/2006    Priority:  Medium    Essential hypertension 12/13/2006    Priority: Medium    Senile purpura ([place]) 05/03/2016    Priority: Low   Hypersomnia 11/15/2015    Priority: Low   Former smoker 04/29/2014    Priority: Low   Morbid obesity ([place]) 07/13/2010    Priority: Low   Insomnia 10/03/2007    Priority: Low   Parkinson's disease ([place]) 01/11/2021   Sepsis without acute organ dysfunction ([place])    Cellulitis 04/12/2020   Strain of right biceps 09/12/2017   Cough 11/15/2015   Dizziness 12/30/2014    Medications- reviewed and updated Current Outpatient Medications  Medication Sig Dispense Refill   albuterol (VENTOLIN HFA) 108 (90 Base) MCG/ACT inhaler Inhale 2 puffs into the lungs every 6 (six) hours as needed for wheezing or shortness of breath. 18 g 1   carbidopa-levodopa (SINEMET IR) 25-100 MG tablet TAKE 2 TABLETS AT 7AM,11AM, 3PM, AND AT 7PM. 720 tablet 0   Infant Care Products (DERMACLOUD) OINT Apply topically.     rosuvastatin (CRESTOR) 10 MG tablet TAKE ONE TABLET BY MOUTH DAILY 90 tablet 0   No current facility-administered medications for this visit.     Objective:  Ht 5\' 7"  (1.702 m)    Wt 230 lb (104.3 kg)    BMI 36.02 kg/m  self reported vitals Gen: NAD, resting comfortably Lungs: nonlabored, normal respiratory rate  Skin: appears dry, no obvious rash Wheelchair-bound    Assessment and Plan   #Parkinson's plus syndrome-followed by Dr. Carles Collet of [facility] neurology -Now with decubitus ulcer on  buttocks bilaterally S:medication: Compliant with carbidopa levodopa 25-100 mg-patient takes 2 tablets at 7 AM, 11 AM, 3 PM and 7 PM    Patient has caregiver daily including weekends and also at night.  Previously was able to stand and walk short distances with walker.  Now primarily depending on motorized wheelchair-she has had a rather significant setback recently.  Sedentary activity as well as incontinence issues have contributed to decubitus ulcers.  Patient had issues with wound on  buttocks back to September- recently home health has been helping to try to resolve this. Using desitin reports break in skin. A few small areas. Areas are improving with their help- oozing and bleeding still. Working with Building surveyor  A/P: Unfortunately Parkinson plus syndrome has made patient progressively less mobile-now requiring significant assist at home.  Much less mobile/more sedentary and has developed decubitus ulcers-incontinence contributing as well.  We will send out home health nursing to help treat wounds that have developed-sound to be at least stage I  #Lymphedema with history of malignant melanoma left leg S: Severe lymphedema after history of malignant melanoma in the left leg. Has required treatment at lymphedema clinic with [facility] in the past.  He also had received therapy in the last 6 months-reportsLegs back to normal. Pressure machine and pressure socks helpful  A/P: Lymphedema improved-apparently his compression stockings can now be covered by Medicare-he is going to submit information and we can send this in for him  #hyperlipidemia S: Medication:Compliant with rosuvastatin 10 mg daily Lab Results  Component Value Date   CHOL 119 05/24/2021   HDL 35.00 (L) 05/24/2021   LDLCALC 45 05/24/2021   LDLDIRECT 50.0 05/03/2016   TRIG 192.0 (H) 05/24/2021   CHOLHDL 3 05/24/2021   A/P: Has been well controlled-continue current medication  #hypertension S: medication: None at present- has been taken off medication Home readings #s: 118/70 on last check at home BP Readings from Last 3 Encounters:  07/26/21 126/78  05/24/21 120/72  01/13/21 116/72  A/P:  Controlled. Continue current medications.    # Hyperglycemia/insulin resistance/prediabetes-peak A1c 5.29 September 2019 S: Medication: None at present Lab Results  Component Value Date   HGBA1C 5.8 (H) 10/03/2019   HGBA1C 5.6 02/09/2017   HGBA1C 5.7 05/03/2016  A/P: We will plan to check this at follow-up  #BPH  associated with nocturia/OAB-in past no Rx as he had orthostasis with Flomax-i believe gemtesa was too expensive  #senile purpura- noted status-appears Stable. Check cbc at least annually-lites checked in November  #Senile purpura-easy bruising on forearms-we will plan on updated CBC at least once yearly. Patient reports stable  Recommended follow up: Keep May visit Future Appointments  Date Time Provider [place]  11/22/2021  1:40 PM [name], MD [facility]  03/28/2022  3:30 PM [name], [name], DO [facility] None  06/05/2022  2:30 PM [facility] HEALTH COACH [facility]   Lab/Order associations:   ICD-10-CM   1. Pressure injury of skin of contiguous region involving back, buttock, and hip, unspecified injury stage, unspecified laterality  L89.40 Ambulatory referral to [facility]    2. 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 Lymphedema  I89.0     4. Hyperlipidemia, unspecified hyperlipidemia type  E78.5     5. Essential hypertension  I10     6. BPH associated with nocturia  N40.1    R35.1     7. Senile purpura ([place])  D69.2      Return precautions advised.  [name], MD

## 2021-08-17 ENCOUNTER — Telehealth: Payer: Self-pay

## 2021-08-17 DIAGNOSIS — R351 Nocturia: Secondary | ICD-10-CM | POA: Diagnosis not present

## 2021-08-17 DIAGNOSIS — Z87891 Personal history of nicotine dependence: Secondary | ICD-10-CM | POA: Diagnosis not present

## 2021-08-17 DIAGNOSIS — Z6841 Body Mass Index (BMI) 40.0 and over, adult: Secondary | ICD-10-CM | POA: Diagnosis not present

## 2021-08-17 DIAGNOSIS — E785 Hyperlipidemia, unspecified: Secondary | ICD-10-CM | POA: Diagnosis not present

## 2021-08-17 DIAGNOSIS — I872 Venous insufficiency (chronic) (peripheral): Secondary | ICD-10-CM | POA: Diagnosis not present

## 2021-08-17 DIAGNOSIS — N401 Enlarged prostate with lower urinary tract symptoms: Secondary | ICD-10-CM | POA: Diagnosis not present

## 2021-08-17 DIAGNOSIS — G4710 Hypersomnia, unspecified: Secondary | ICD-10-CM | POA: Diagnosis not present

## 2021-08-17 DIAGNOSIS — L89322 Pressure ulcer of left buttock, stage 2: Secondary | ICD-10-CM | POA: Diagnosis not present

## 2021-08-17 DIAGNOSIS — I10 Essential (primary) hypertension: Secondary | ICD-10-CM | POA: Diagnosis not present

## 2021-08-17 DIAGNOSIS — Z8616 Personal history of COVID-19: Secondary | ICD-10-CM | POA: Diagnosis not present

## 2021-08-17 DIAGNOSIS — K117 Disturbances of salivary secretion: Secondary | ICD-10-CM | POA: Diagnosis not present

## 2021-08-17 DIAGNOSIS — D692 Other nonthrombocytopenic purpura: Secondary | ICD-10-CM | POA: Diagnosis not present

## 2021-08-17 DIAGNOSIS — Z9181 History of falling: Secondary | ICD-10-CM | POA: Diagnosis not present

## 2021-08-17 DIAGNOSIS — L89312 Pressure ulcer of right buttock, stage 2: Secondary | ICD-10-CM | POA: Diagnosis not present

## 2021-08-17 DIAGNOSIS — G4700 Insomnia, unspecified: Secondary | ICD-10-CM | POA: Diagnosis not present

## 2021-08-17 DIAGNOSIS — N39498 Other specified urinary incontinence: Secondary | ICD-10-CM | POA: Diagnosis not present

## 2021-08-17 DIAGNOSIS — I890 Lymphedema, not elsewhere classified: Secondary | ICD-10-CM | POA: Diagnosis not present

## 2021-08-17 DIAGNOSIS — Z85828 Personal history of other malignant neoplasm of skin: Secondary | ICD-10-CM | POA: Diagnosis not present

## 2021-08-17 DIAGNOSIS — G20 Parkinson's disease: Secondary | ICD-10-CM | POA: Diagnosis not present

## 2021-08-17 DIAGNOSIS — G4733 Obstructive sleep apnea (adult) (pediatric): Secondary | ICD-10-CM | POA: Diagnosis not present

## 2021-08-17 NOTE — Telephone Encounter (Signed)
Spoke to Echo at [facility], gave verbal orders as requested.

## 2021-08-17 NOTE — Telephone Encounter (Signed)
Team you may call and provide verbal order

## 2021-08-17 NOTE — Telephone Encounter (Signed)
Please advise 

## 2021-08-17 NOTE — Telephone Encounter (Signed)
..  Home Health Verbal Orders  Agency:  [facility]   Caller: [place]  Contact and title  Requesting OT/ PT/ Skilled nursing/ Social Work/ Speech:  Skilled nursing   Reason for Request:  dressing   Frequency:  superficial areas to left and right buttox.  Needs to change dressing every other day as needed.    FYI:  Does not have any open areas on legs.  States patient does wear compression socks.    HH needs F2F w/in last 30 days

## 2021-08-24 ENCOUNTER — Telehealth: Payer: Self-pay

## 2021-08-24 DIAGNOSIS — N401 Enlarged prostate with lower urinary tract symptoms: Secondary | ICD-10-CM | POA: Diagnosis not present

## 2021-08-24 DIAGNOSIS — G4700 Insomnia, unspecified: Secondary | ICD-10-CM | POA: Diagnosis not present

## 2021-08-24 DIAGNOSIS — Z9181 History of falling: Secondary | ICD-10-CM | POA: Diagnosis not present

## 2021-08-24 DIAGNOSIS — Z85828 Personal history of other malignant neoplasm of skin: Secondary | ICD-10-CM | POA: Diagnosis not present

## 2021-08-24 DIAGNOSIS — G4733 Obstructive sleep apnea (adult) (pediatric): Secondary | ICD-10-CM | POA: Diagnosis not present

## 2021-08-24 DIAGNOSIS — E785 Hyperlipidemia, unspecified: Secondary | ICD-10-CM | POA: Diagnosis not present

## 2021-08-24 DIAGNOSIS — L89322 Pressure ulcer of left buttock, stage 2: Secondary | ICD-10-CM | POA: Diagnosis not present

## 2021-08-24 DIAGNOSIS — I890 Lymphedema, not elsewhere classified: Secondary | ICD-10-CM | POA: Diagnosis not present

## 2021-08-24 DIAGNOSIS — G4710 Hypersomnia, unspecified: Secondary | ICD-10-CM | POA: Diagnosis not present

## 2021-08-24 DIAGNOSIS — R351 Nocturia: Secondary | ICD-10-CM | POA: Diagnosis not present

## 2021-08-24 DIAGNOSIS — I872 Venous insufficiency (chronic) (peripheral): Secondary | ICD-10-CM | POA: Diagnosis not present

## 2021-08-24 DIAGNOSIS — D692 Other nonthrombocytopenic purpura: Secondary | ICD-10-CM | POA: Diagnosis not present

## 2021-08-24 DIAGNOSIS — G20 Parkinson's disease: Secondary | ICD-10-CM | POA: Diagnosis not present

## 2021-08-24 DIAGNOSIS — I10 Essential (primary) hypertension: Secondary | ICD-10-CM | POA: Diagnosis not present

## 2021-08-24 DIAGNOSIS — L89312 Pressure ulcer of right buttock, stage 2: Secondary | ICD-10-CM | POA: Diagnosis not present

## 2021-08-24 DIAGNOSIS — N39498 Other specified urinary incontinence: Secondary | ICD-10-CM | POA: Diagnosis not present

## 2021-08-24 DIAGNOSIS — K117 Disturbances of salivary secretion: Secondary | ICD-10-CM | POA: Diagnosis not present

## 2021-08-24 DIAGNOSIS — Z87891 Personal history of nicotine dependence: Secondary | ICD-10-CM | POA: Diagnosis not present

## 2021-08-24 DIAGNOSIS — Z8616 Personal history of COVID-19: Secondary | ICD-10-CM | POA: Diagnosis not present

## 2021-08-24 DIAGNOSIS — Z6841 Body Mass Index (BMI) 40.0 and over, adult: Secondary | ICD-10-CM | POA: Diagnosis not present

## 2021-08-24 NOTE — Telephone Encounter (Signed)
Laqueta Linden from [facility] skilled nursing called advised that patients spouse would like to increase the frequency of nursing from once per week to twice per week. Please call Laqueta Linden back at [phone]

## 2021-08-24 NOTE — Telephone Encounter (Signed)
Called and lm on [place] vm with VO.

## 2021-08-27 DIAGNOSIS — L89312 Pressure ulcer of right buttock, stage 2: Secondary | ICD-10-CM | POA: Diagnosis not present

## 2021-08-27 DIAGNOSIS — Z85828 Personal history of other malignant neoplasm of skin: Secondary | ICD-10-CM | POA: Diagnosis not present

## 2021-08-27 DIAGNOSIS — N39498 Other specified urinary incontinence: Secondary | ICD-10-CM | POA: Diagnosis not present

## 2021-08-27 DIAGNOSIS — N401 Enlarged prostate with lower urinary tract symptoms: Secondary | ICD-10-CM | POA: Diagnosis not present

## 2021-08-27 DIAGNOSIS — I890 Lymphedema, not elsewhere classified: Secondary | ICD-10-CM | POA: Diagnosis not present

## 2021-08-27 DIAGNOSIS — E785 Hyperlipidemia, unspecified: Secondary | ICD-10-CM | POA: Diagnosis not present

## 2021-08-27 DIAGNOSIS — I872 Venous insufficiency (chronic) (peripheral): Secondary | ICD-10-CM | POA: Diagnosis not present

## 2021-08-27 DIAGNOSIS — R351 Nocturia: Secondary | ICD-10-CM | POA: Diagnosis not present

## 2021-08-27 DIAGNOSIS — G4710 Hypersomnia, unspecified: Secondary | ICD-10-CM | POA: Diagnosis not present

## 2021-08-27 DIAGNOSIS — Z8616 Personal history of COVID-19: Secondary | ICD-10-CM | POA: Diagnosis not present

## 2021-08-27 DIAGNOSIS — D692 Other nonthrombocytopenic purpura: Secondary | ICD-10-CM | POA: Diagnosis not present

## 2021-08-27 DIAGNOSIS — Z9181 History of falling: Secondary | ICD-10-CM | POA: Diagnosis not present

## 2021-08-27 DIAGNOSIS — L89322 Pressure ulcer of left buttock, stage 2: Secondary | ICD-10-CM | POA: Diagnosis not present

## 2021-08-27 DIAGNOSIS — K117 Disturbances of salivary secretion: Secondary | ICD-10-CM | POA: Diagnosis not present

## 2021-08-27 DIAGNOSIS — I10 Essential (primary) hypertension: Secondary | ICD-10-CM | POA: Diagnosis not present

## 2021-08-27 DIAGNOSIS — G4733 Obstructive sleep apnea (adult) (pediatric): Secondary | ICD-10-CM | POA: Diagnosis not present

## 2021-08-27 DIAGNOSIS — G20 Parkinson's disease: Secondary | ICD-10-CM | POA: Diagnosis not present

## 2021-08-27 DIAGNOSIS — G4700 Insomnia, unspecified: Secondary | ICD-10-CM | POA: Diagnosis not present

## 2021-08-27 DIAGNOSIS — Z6841 Body Mass Index (BMI) 40.0 and over, adult: Secondary | ICD-10-CM | POA: Diagnosis not present

## 2021-08-27 DIAGNOSIS — Z87891 Personal history of nicotine dependence: Secondary | ICD-10-CM | POA: Diagnosis not present

## 2021-08-29 DIAGNOSIS — Z85820 Personal history of malignant melanoma of skin: Secondary | ICD-10-CM | POA: Diagnosis not present

## 2021-08-29 DIAGNOSIS — D225 Melanocytic nevi of trunk: Secondary | ICD-10-CM | POA: Diagnosis not present

## 2021-08-29 DIAGNOSIS — D2239 Melanocytic nevi of other parts of face: Secondary | ICD-10-CM | POA: Diagnosis not present

## 2021-08-29 DIAGNOSIS — Z85828 Personal history of other malignant neoplasm of skin: Secondary | ICD-10-CM | POA: Diagnosis not present

## 2021-08-29 DIAGNOSIS — L821 Other seborrheic keratosis: Secondary | ICD-10-CM | POA: Diagnosis not present

## 2021-08-29 DIAGNOSIS — R21 Rash and other nonspecific skin eruption: Secondary | ICD-10-CM | POA: Diagnosis not present

## 2021-08-29 DIAGNOSIS — D2271 Melanocytic nevi of right lower limb, including hip: Secondary | ICD-10-CM | POA: Diagnosis not present

## 2021-09-02 DIAGNOSIS — R351 Nocturia: Secondary | ICD-10-CM | POA: Diagnosis not present

## 2021-09-02 DIAGNOSIS — N3941 Urge incontinence: Secondary | ICD-10-CM | POA: Diagnosis not present

## 2021-09-02 DIAGNOSIS — R350 Frequency of micturition: Secondary | ICD-10-CM | POA: Diagnosis not present

## 2021-09-09 DIAGNOSIS — N401 Enlarged prostate with lower urinary tract symptoms: Secondary | ICD-10-CM | POA: Diagnosis not present

## 2021-09-09 DIAGNOSIS — Z87891 Personal history of nicotine dependence: Secondary | ICD-10-CM | POA: Diagnosis not present

## 2021-09-09 DIAGNOSIS — I872 Venous insufficiency (chronic) (peripheral): Secondary | ICD-10-CM | POA: Diagnosis not present

## 2021-09-09 DIAGNOSIS — Z6841 Body Mass Index (BMI) 40.0 and over, adult: Secondary | ICD-10-CM | POA: Diagnosis not present

## 2021-09-09 DIAGNOSIS — N39498 Other specified urinary incontinence: Secondary | ICD-10-CM | POA: Diagnosis not present

## 2021-09-09 DIAGNOSIS — Z9181 History of falling: Secondary | ICD-10-CM | POA: Diagnosis not present

## 2021-09-09 DIAGNOSIS — G4700 Insomnia, unspecified: Secondary | ICD-10-CM | POA: Diagnosis not present

## 2021-09-09 DIAGNOSIS — L89312 Pressure ulcer of right buttock, stage 2: Secondary | ICD-10-CM | POA: Diagnosis not present

## 2021-09-09 DIAGNOSIS — G20 Parkinson's disease: Secondary | ICD-10-CM | POA: Diagnosis not present

## 2021-09-09 DIAGNOSIS — Z85828 Personal history of other malignant neoplasm of skin: Secondary | ICD-10-CM | POA: Diagnosis not present

## 2021-09-09 DIAGNOSIS — G4733 Obstructive sleep apnea (adult) (pediatric): Secondary | ICD-10-CM | POA: Diagnosis not present

## 2021-09-09 DIAGNOSIS — L89322 Pressure ulcer of left buttock, stage 2: Secondary | ICD-10-CM | POA: Diagnosis not present

## 2021-09-09 DIAGNOSIS — K117 Disturbances of salivary secretion: Secondary | ICD-10-CM | POA: Diagnosis not present

## 2021-09-09 DIAGNOSIS — Z8616 Personal history of COVID-19: Secondary | ICD-10-CM | POA: Diagnosis not present

## 2021-09-09 DIAGNOSIS — I10 Essential (primary) hypertension: Secondary | ICD-10-CM | POA: Diagnosis not present

## 2021-09-09 DIAGNOSIS — R351 Nocturia: Secondary | ICD-10-CM | POA: Diagnosis not present

## 2021-09-09 DIAGNOSIS — E785 Hyperlipidemia, unspecified: Secondary | ICD-10-CM | POA: Diagnosis not present

## 2021-09-09 DIAGNOSIS — D692 Other nonthrombocytopenic purpura: Secondary | ICD-10-CM | POA: Diagnosis not present

## 2021-09-09 DIAGNOSIS — I890 Lymphedema, not elsewhere classified: Secondary | ICD-10-CM | POA: Diagnosis not present

## 2021-09-09 DIAGNOSIS — G4710 Hypersomnia, unspecified: Secondary | ICD-10-CM | POA: Diagnosis not present

## 2021-09-17 ENCOUNTER — Other Ambulatory Visit: Payer: Self-pay | Admitting: Family Medicine

## 2021-09-23 DIAGNOSIS — D692 Other nonthrombocytopenic purpura: Secondary | ICD-10-CM | POA: Diagnosis not present

## 2021-09-23 DIAGNOSIS — L89312 Pressure ulcer of right buttock, stage 2: Secondary | ICD-10-CM | POA: Diagnosis not present

## 2021-09-23 DIAGNOSIS — N39498 Other specified urinary incontinence: Secondary | ICD-10-CM | POA: Diagnosis not present

## 2021-09-23 DIAGNOSIS — R351 Nocturia: Secondary | ICD-10-CM | POA: Diagnosis not present

## 2021-09-23 DIAGNOSIS — K117 Disturbances of salivary secretion: Secondary | ICD-10-CM | POA: Diagnosis not present

## 2021-09-23 DIAGNOSIS — Z6841 Body Mass Index (BMI) 40.0 and over, adult: Secondary | ICD-10-CM | POA: Diagnosis not present

## 2021-09-23 DIAGNOSIS — G20 Parkinson's disease: Secondary | ICD-10-CM | POA: Diagnosis not present

## 2021-09-23 DIAGNOSIS — Z85828 Personal history of other malignant neoplasm of skin: Secondary | ICD-10-CM | POA: Diagnosis not present

## 2021-09-23 DIAGNOSIS — I872 Venous insufficiency (chronic) (peripheral): Secondary | ICD-10-CM | POA: Diagnosis not present

## 2021-09-23 DIAGNOSIS — G4710 Hypersomnia, unspecified: Secondary | ICD-10-CM | POA: Diagnosis not present

## 2021-09-23 DIAGNOSIS — I890 Lymphedema, not elsewhere classified: Secondary | ICD-10-CM | POA: Diagnosis not present

## 2021-09-23 DIAGNOSIS — G4700 Insomnia, unspecified: Secondary | ICD-10-CM | POA: Diagnosis not present

## 2021-09-23 DIAGNOSIS — E785 Hyperlipidemia, unspecified: Secondary | ICD-10-CM | POA: Diagnosis not present

## 2021-09-23 DIAGNOSIS — G4733 Obstructive sleep apnea (adult) (pediatric): Secondary | ICD-10-CM | POA: Diagnosis not present

## 2021-09-23 DIAGNOSIS — Z87891 Personal history of nicotine dependence: Secondary | ICD-10-CM | POA: Diagnosis not present

## 2021-09-23 DIAGNOSIS — N401 Enlarged prostate with lower urinary tract symptoms: Secondary | ICD-10-CM | POA: Diagnosis not present

## 2021-09-23 DIAGNOSIS — L89322 Pressure ulcer of left buttock, stage 2: Secondary | ICD-10-CM | POA: Diagnosis not present

## 2021-09-23 DIAGNOSIS — Z8616 Personal history of COVID-19: Secondary | ICD-10-CM | POA: Diagnosis not present

## 2021-09-23 DIAGNOSIS — I10 Essential (primary) hypertension: Secondary | ICD-10-CM | POA: Diagnosis not present

## 2021-09-23 DIAGNOSIS — Z9181 History of falling: Secondary | ICD-10-CM | POA: Diagnosis not present

## 2021-09-27 ENCOUNTER — Telehealth: Payer: Self-pay | Admitting: Family Medicine

## 2021-09-27 NOTE — Telephone Encounter (Signed)
Pt states he is needing an order for compression socks ?

## 2021-09-28 ENCOUNTER — Telehealth: Payer: Self-pay | Admitting: Family Medicine

## 2021-09-28 ENCOUNTER — Telehealth: Payer: Self-pay | Admitting: Neurology

## 2021-09-28 NOTE — Telephone Encounter (Signed)
Patient called in regards to not being able to feel his hand or fingers. Sent to triage nurse. ?

## 2021-09-28 NOTE — Telephone Encounter (Signed)
Spoke with patient.  States both hands are numb and tingling.  I offered in office appts for tomorrow.  Patient could not come into the office.  Patient did ask about virtual but stated he has also called Dr. Arturo Morton office and is waiting on a response.      Patient Name: Nicholas [name] Gender: Male DOB: 01/20/1946 Age: 76 Y 36 M 26 D Return Phone Number: [phone] (Primary), [phone] (Secondary) Address: City/ State/ Zip:  [place]  [address] Client [place] [facility] [place] [facility] [place] [facility] [place] [facility] Provider [name]- MD Contact Type Call Who Is Calling Patient / Member / Family / Caregiver Call Type Triage / Clinical Caller Name Alexanderia from office Relationship To Patient Other Return Phone Number [phone] (Primary) Chief Complaint NUMBNESS/TINGLING- sudden on one side of the body or face Reason for Call Symptomatic / Request for [name] states pt can not feel his fingers or hands. Translation No Nurse Assessment Nurse: Adrian Blackwater, RN, Claiborne Billings Date/Time Eilene Ghazi Time): 09/28/2021 12:34:03 PM Confirm and document reason for call. If symptomatic, describe symptoms. ---Caller states he has had numbness/tingling in his Rt hand for several days, without edema or erythema. Does the patient have any new or worsening symptoms? ---Yes Will a triage be completed? ---Yes Related visit to physician within the last 2 weeks? ---No Does the PT have any chronic conditions? (i.e. diabetes, asthma, this includes High risk factors for pregnancy, etc.) ---Yes List chronic conditions. ---[name], Lymphedema, Hx Melanoma 10 years ago Is this a behavioral health or substance abuse call? ---No Guidelines Guideline Title Affirmed Question Affirmed Notes Nurse Date/Time (Eastern Time) Neurologic Deficit [1] Weakness of the face, arm / hand, or leg / foot on one side of the body AND  [2] gradual onset (e.g., days to weeks) AN  Disp. Time Eilene Ghazi Time) Disposition Final User 09/28/2021 12:31:49 PM Send to Urgent Queue Paulita Cradle 09/28/2021 12:41:01 PM See HCP within 4 Hours (or PCP triage) Yes Adrian Blackwater, RN, [place] Barb Disagree/Comply Comply Caller Understands Yes PreDisposition Call Doctor Care Advice Given Per Guideline SEE HCP (OR PCP TRIAGE) WITHIN 4 HOURS: * IF OFFICE WILL BE OPEN: You need to be seen within the next 3 or 4 hours. Call your doctor (or NP/PA) now or as soon as the office opens. CALL BACK IF: * You become worse Comments User: Delana Meyer, RN Date/Time (Eastern Time): 09/28/2021 12:43:18 PM Pt will call back after lunch for possible appt within 4 Hrs today. Also advised him to reach out to his [name] or movement disorder Dr and he agreed to do so. If unable to be seen, advised that possibly someone from the Neurologists office could get him in there. Referrals REFERRED TO PCP OFFICE

## 2021-09-28 NOTE — Telephone Encounter (Signed)
Just having the tingling in both his hands been going on a few days has trouble gripping. No slurred speech no facial drooping,  ?

## 2021-09-28 NOTE — Telephone Encounter (Signed)
Patients states he is having numbness and tingling in his right hand ?

## 2021-09-29 NOTE — Telephone Encounter (Signed)
Pt stated that it is a constant tingling day and night its no worse or better anytime of the day.  ?

## 2021-09-29 NOTE — Telephone Encounter (Signed)
Script for wrist splint faxed to [facility] for pt, pt called an informed that we were going to fax it for him, pt was given the number to [employer] to call before going. Pt advised that if he has any changes in symptoms he needs to go to the ER for evaluation  ?

## 2021-09-29 NOTE — Telephone Encounter (Signed)
FYI, see below.

## 2021-09-29 NOTE — Telephone Encounter (Signed)
Appears addressed by neurology- possible carpal tunnel ?

## 2021-09-30 ENCOUNTER — Telehealth: Payer: Self-pay | Admitting: Neurology

## 2021-09-30 NOTE — Telephone Encounter (Signed)
Pt called an informed that he can go to the drug store to get a temporary split while he is waiting on his  ?

## 2021-09-30 NOTE — Telephone Encounter (Signed)
Pt called back in stating the place we sent the referral for his splint says they won't have it ready for 5 weeks. He wants to know if that is ok? ?

## 2021-10-03 ENCOUNTER — Ambulatory Visit: Payer: PPO | Attending: Neurology

## 2021-10-03 ENCOUNTER — Other Ambulatory Visit: Payer: Self-pay

## 2021-10-03 DIAGNOSIS — R471 Dysarthria and anarthria: Secondary | ICD-10-CM

## 2021-10-03 DIAGNOSIS — R1310 Dysphagia, unspecified: Secondary | ICD-10-CM

## 2021-10-03 NOTE — Therapy (Signed)
[facility] ?[facility] ?[address], [address] ?[place], [place], [address] ?Phone: [phone]   Fax:  [phone] ? ?Patient Details  ?Name: Nicholas [name] ?MRN: [id number] ?Date of Birth: 12/05/1945 ?Referring Provider: Alonza Bogus, DO ? ?Encounter Date: 10/03/2021 ? ?SPEECH THERAPY DISCHARGE SUMMARY ? ?Visits from Start of Care: 6 ? ?Current functional level related to goals / functional outcomes: ?The following are goals/assessment from pt's last ST visit in January 2021. ?SLP Short Term Goals - 07/30/20 1341   ?    ?     ?  SLP SHORT TERM GOAL #1  ?  Title pt will sustain loud /a/ at >90 dB at 30 cm over 3 consecutive sessions   ?  Period Weeks   ?  Status Not Met   ?     ?  SLP SHORT TERM GOAL #2  ?  Title pt will exhibit volume of 70dB in sentence responses 90% over 3 sessions   ?  Time 1   ?  Period Weeks   ?  Status Not Met   ?     ?  SLP SHORT TERM GOAL #3  ?  Title In 5 minutes simple conversation pt will achieve average speech volume of low 70s dB over two sessions   ?  Status Not Met   ?     ?  SLP SHORT TERM GOAL #4  ?  Title pt will undergo assessment of anomia if clinically indicated   ?  Status Deferred   ?   ?  ?   ?  ?  ?      ?SLP Long Term Goals - 07/30/20 1344   ?    ?     ?  SLP LONG TERM GOAL #1  ?  Title pt will sustain loud /a/ at average >90dB at 30 cm over 5 consecutive sessions5   ?  Time 5   ?  Period Weeks   or 17 total sessions - for all LTGs  ?  Status On-going   ?     ?  SLP LONG TERM GOAL #2  ?  Title pt will generate speech in 8 minutes simple to mod complex conversation with average >70dB over three sessions   ?  Time 5   ?  Period Weeks   ?  Status On-going   ?     ?  SLP LONG TERM GOAL #3  ?  Title pt to undergo assessment of cognitive linguistics PRN and goals added PRN   ?  Time 5   ?  Period Weeks   ?  Status On-going   ?   ?  ?   ?  ?  ?      ?Plan - 07/30/20 1341   ?  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 Pt was usually somnolent today (closed eyelids >5 seconds x10 doday's session) and SLP req'd to use verbal cues to wake pt x4 during session and also used cool water for pt to drink - pt awoke at other times between 4-10 seconds. Pt again forgot everyday sentences and told SLP he has not performed these as prescribed. See "skilled intervention" for more details. I recommend skilled ST to address dysarthria, cognitive-communication and dysphagia. SLP to monitor diet tolerance; will request MBS orders and schedule objective testing if warranted.   ?  Speech Therapy Frequency 2x / week   ?  Duration --   8 weeks or 17 total visits  ?  Treatment/Interventions Aspiration precaution training;Diet toleration management by SLP;Trials of upgraded texture/liquids;Cognitive reorganization;Multimodal communcation approach;Compensatory strategies;Pharyngeal strengthening exercises;Language facilitation;Compensatory techniques;Cueing hierarchy;Internal/external aids;Functional tasks;SLP instruction and feedback;Patient/family education;Other (comment)   MBS  ?  Potential to Achieve Goals Good   ?  ? ?  ?Remaining deficits: ?All deficits remained. ?  ?Education / Equipment: ?Compensations for softer voice. Homework necessary for progress with ST  ? ?Patient agrees to discharge. Patient goals were not met. Patient is being discharged due to not returning since the last visit.Marland Kitchen Pt rec'd home health therapies. ? ? ? ? ? ?Manasseh Pittsley, CCC-SLP ?10/03/2021, 11:24 PM ? ? ?[facility] ?[address], [address] ?[place], [place], [address] ?Phone: [phone]   Fax:  [phone] ?

## 2021-10-03 NOTE — Telephone Encounter (Signed)
Compression sock order has been faxed to [facility].  ?

## 2021-10-03 NOTE — Therapy (Signed)
[facility] [address], [place], [place], [address] Phone: [phone]   Fax:  [phone]  Speech Language Pathology Evaluation  Patient Details  Name: Nicholas [name] MRN: [id number] Date of Birth: March 10, 1946 Referring Provider (SLP): Alonza Bogus, DO   Encounter Date: 10/03/2021   End of Session - 10/03/21 2306     Visit Number 1    Number of Visits 17    Date for SLP Re-Evaluation 12/02/21    SLP Start Time 1406    SLP Stop Time  1856    SLP Time Calculation (min) 43 min    Activity Tolerance Patient tolerated treatment well             Past Medical History:  Diagnosis Date   Cancer [facility]) Jan 2008   skin; hx of melanoma left foot/ amputation of toes 1&2    Hyperlipidemia    Hypertension    Lymphedema     Past Surgical History:  Procedure Laterality Date   4th toe- 2nd primary melanoma  2009   removal of melanoma of left foot/amputation of toes 1&25 Jul 2006   WRIST FRACTURE SURGERY     76 years old-set    There were no vitals filed for this visit.   Subjective Assessment - 10/03/21 1542     Subjective Pt indicates his wife often tells him to be louder and he indicates he is giving the effort he has for loudness. (wife) "I need you to fix him."    Currently in Pain? No/denies                SLP Evaluation [facility] - 10/03/21 1543       SLP Visit Information   SLP Received On 10/03/21    Referring Provider (SLP) Alonza Bogus, DO    Onset Date 12/18/17    Medical Diagnosis Parkinson's/ PSP      Subjective   Subjective "I wanted to keep home health but speech was $75 copay."      General Information   HPI hx of PD/Parkinson's Plus Syndrone; malignant melanoma, hyperlipidemia, HTN, insomnia, obesity, hyperglycemia, dizziness, OSA, urinary urgency, lymphedema. Known to ST from prior courses, most recently November-December 2021 for 6 sessions ST and pt did not return to ST due to needing home health  therapies. Pt has health aide 9am-12pm and one hour at night x7 days/week.    Mobility Status arrived in motorozed wheelchair      Balance Screen   Has the patient fallen in the past 6 months No      Prior Functional Status   Cognitive/Linguistic Baseline Within functional limits    Baseline deficit details --    Type of Home House     Lives With Spouse    Available Support Personal care attendant;Family    Vocation Retired      Associate Professor   Overall Cognitive Status History of cognitive impairments - at baseline   however appears [facility] today during evaluation; Pt recalled last therapy course and items he was reviewing in home health PT and OT. He may require formal cognitive assessment during this therapy course.   Area of Impairment --    Current Attention Level --      Auditory Comprehension   Overall Auditory Comprehension Appears within functional limits for tasks assessed      Verbal Expression   Overall Verbal Expression Appears within functional limits for tasks assessed      Oral  Motor/Sensory Function   Overall Oral Motor/Sensory Function Impaired    Labial ROM Reduced right;Reduced left   as reps progressed   Labial Symmetry Within Functional Limits    Labial Strength Reduced    Labial Coordination Reduced    Lingual ROM Reduced right;Reduced left   better with visual cues   Lingual Strength Reduced   visual and verbal cues improved productions   Lingual Coordination Reduced    Facial ROM Reduced right;Reduced left      Motor Speech   Respiration Impaired   speaking on residual volume, mod cues for full breath for speech/vocalization   Level of Impairment Word    Phonation Low vocal intensity   low 60s dB average in 5 minutes conversation   Intelligibility Intelligibility reduced   in quiet environment pt intelligible except for 8% of utterances that were aphonic due to reduced inspiratory volume; in min noisy environment pt would not be heard   Effective Techniques  Increased vocal intensity   /a/ at low 90s dB with min A. He req'd occasional mod cues for full breath and occasional mod cues for loudness and increased loudness in 1-2 sentence responses in semi-structured tasks to mid-upper 60s dB average                            SLP Education - 10/03/21 2255     Education Details everyday sentences and rationale, loud /a/ and rationale, why daily homework is important    Person(s) Educated Patient    Methods Explanation;Demonstration;Handout    Comprehension Verbalized understanding;Returned demonstration;Need further instruction;Verbal cues required              SLP Short Term Goals - 10/03/21 2311       SLP SHORT TERM GOAL #1   Title pt will sustain loud /a/ at >90 dB average at 30 cm over 3  sessions    Time 4    Period Weeks    Status New    Target Date 11/04/21      SLP SHORT TERM GOAL #2   Title pt will exhibit volume of 70dB in sentence responses 90% with rare min A over 3 sessions    Time 4    Period Weeks    Status New    Target Date 11/04/21      SLP SHORT TERM GOAL #3   Title In 3 minutes simple conversation pt will achieve average speech volume of upper 60s-low 70s dB with occasional min cues over two sessions    Time 4    Period Weeks    Status New    Target Date 11/04/21      SLP SHORT TERM GOAL #4   Title pt will undergo instrumental swallow eval if clinically indicated    Time 4    Period Weeks    Status New    Target Date 11/04/21      SLP SHORT TERM GOAL #5   Title pt will use breath appropriate for 3 minutes simple conversation 90% of the time in 3 sessions    Time 4    Period Weeks    Status New    Target Date 11/04/21              SLP Long Term Goals - 10/03/21 2317       SLP LONG TERM GOAL #1   Title pt will sustain loud /a/ at average >90dB at 30 cm  over 6  sessions    Time 8    Period Weeks   or 17 total sessions - for all LTGs   Status New    Target Date 12/02/21       SLP LONG TERM GOAL #2   Title pt will generate speech in 6 minutes simple to mod complex conversation with average upper 60s-low 70s dB average over three sessions    Time 8    Period Weeks    Status New    Target Date 12/02/21      SLP LONG TERM GOAL #3   Title pt will use breath appropriate for 6 minutes simple-mod complex conversation 80% of the time in 3 sessions    Time 6    Period Weeks    Status New    Target Date 12/02/21      SLP LONG TERM GOAL #4   Title pt wife will demo appropriate cues for pt to maximize loudness in 5 mintues conversation with her in 2 sessions    Time 8    Period Weeks    Status New    Target Date 12/02/21      SLP LONG TERM GOAL #5   Title pt will report a better outcome with communication than in the first week of therapy, on a PROM taken in one of the last 2 sessions    Time 8    Period Weeks    Status New    Target Date 12/03/21              Plan - 10/03/21 2306     Clinical Impression Statement Romona Curls "[name]" 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 Pt also reports wordfinding difficulties but not as frequent as previous therapy course. Leroy Sea tells SLP that his lower volume makes it very difficult to communicate with his wife, as she asks him to repeat multiple times a day and he tells her he is trying to be louder. SLP to inquire about pt's swallowing on next visit. His weight has been stable. Pt stimulable for increased vocal intensity in semi-structured speech tasks with occasional mod A (mid-upper 60s dB) for loudness and for deeper breaths for speech. I recommend skilled ST to address dysarthria, and possibly cognitive-communication skills and dysphagia. SLP to monitor diet tolerance; will request MBS orders and schedule objective testing if  warranted.    Speech Therapy Frequency 2x / week    Duration 8 weeks    Treatment/Interventions Aspiration precaution training;Diet toleration management by SLP;Trials of upgraded texture/liquids;Cognitive reorganization;Multimodal communcation approach;Compensatory strategies;Pharyngeal strengthening exercises;Language facilitation;Compensatory techniques;Cueing hierarchy;Internal/external aids;Functional tasks;SLP instruction and feedback;Patient/family education;Other (comment)   MBS   Potential to Achieve Goals Good    SLP Home Exercise Plan loud /a/    Consulted and Agree with Plan of Care Patient             Patient will benefit from skilled therapeutic intervention in order to improve the following deficits and impairments:   Dysarthria and anarthria  Dysphagia, unspecified type    Problem List Patient Active Problem List   Diagnosis Date Noted   Parkinson's disease ([place]) 01/11/2021   Sepsis without acute organ dysfunction ([place])    Cellulitis 04/12/2020   Strain of right biceps 09/12/2017   Lymphedema 02/10/2017   BPH associated with nocturia 05/16/2016   Senile purpura ([place]) 05/03/2016  OSA (obstructive sleep apnea) 12/28/2015   Hypersomnia 11/15/2015   Cough 11/15/2015   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 left leg 03/09/2009   Insomnia 10/03/2007   Hyperlipidemia 12/13/2006   Essential hypertension 12/13/2006    Shigeko Manard, [place] 10/03/2021, 11:22 PM  [facility] [place]. [address], [place], [place], [address] Phone: [phone]   Fax:  [phone]  Name: WELFORD CHRISTMAS MRN: [id number] Date of Birth: [birth year]

## 2021-10-03 NOTE — Patient Instructions (Signed)
?  HOMEWORK - TWICE A DAY: ? ? 5 Loud "ahhhhhhhhhhhhh"  -BIG BREATH! ?Read the 10 "everyday sentences" (which you have generated) - BIG BREATH! ? ?- and do the other handout/s provided by me for 15-20 minutes ?

## 2021-10-05 NOTE — Progress Notes (Signed)
? ?Phone [phone] ?In person visit ?  ?Subjective:  ? ?Nicholas [name] is a 76 y.o. year old very pleasant male patient who presents for/with See problem oriented charting ?Chief Complaint  ?Patient presents with  ? Follow-up  ?  Pt states he has something in his right ear. Pt also c/o tingling in hands and fingers.   ? Hypertension  ? Hyperlipidemia  ? Hyperglycemia  ? ? ?Past Medical History-  ?Patient Active Problem List  ? Diagnosis Date Noted  ? Lymphedema 02/10/2017  ?  Priority: High  ? 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  Priority: High  ? History of malignant melanoma. left leg 03/09/2009  ?  Priority: High  ? BPH associated with nocturia 05/16/2016  ?  Priority: Medium   ? OSA (obstructive sleep apnea) 12/28/2015  ?  Priority: Medium   ? Diastolic dysfunction 35/46/5681  ?  Priority: Medium   ? Chronic venous insufficiency 02/20/2014  ?  Priority: Medium   ? Hyperglycemia 08/02/2012  ?  Priority: Medium   ? Hyperlipidemia 12/13/2006  ?  Priority: Medium   ? Essential hypertension 12/13/2006  ?  Priority: Medium   ? Senile purpura ([place]) 05/03/2016  ?  Priority: Low  ? Hypersomnia 11/15/2015  ?  Priority: Low  ? Former smoker 04/29/2014  ?  Priority: Low  ? Morbid obesity ([place]) 07/13/2010  ?  Priority: Low  ? Insomnia 10/03/2007  ?  Priority: Low  ? Parkinson's disease ([place]) 01/11/2021  ? Sepsis without acute organ dysfunction ([facility])   ? Cellulitis 04/12/2020  ? Strain of right biceps 09/12/2017  ? Cough 11/15/2015  ? Dizziness 12/30/2014  ? ? ?Medications- reviewed and updated ?Current Outpatient Medications  ?Medication Sig Dispense Refill  ? albuterol (VENTOLIN HFA) 108 (90 Base) MCG/ACT inhaler Inhale 2 puffs into the lungs every 6 (six) hours as needed for wheezing or shortness of breath. 18 g 1  ? carbidopa-levodopa (SINEMET IR) 25-100 MG tablet TAKE 2 TABLETS AT 7AM,11AM, 3PM, AND AT 7PM. 720 tablet 0  ? [facility] [employer] [facility]) OINT Apply topically.    ? rosuvastatin (CRESTOR)  10 MG tablet TAKE ONE TABLET BY MOUTH DAILY 90 tablet 0  ? Vibegron (GEMTESA PO) Take by mouth. Through urology    ? ?No current facility-administered medications for this visit.  ? ?  ?Objective:  ?BP 130/70   Pulse 78   Temp 98 ?F (36.7 ?C)   Ht '5\' 7"'$  (1.702 m)   Wt 220 lb (99.8 kg)   SpO2 96%   BMI 34.46 kg/m?  ?Gen: NAD, resting comfortably ?CV: RRR no murmurs rubs or gallops ?Lungs: CTAB no crackles, wheeze, rhonchi ?Ext: no edema ?Skin: warm, dry ?  ? ?Assessment and Plan  ? ?#Left ear-patient complains of feeling like something is in his right ear ? ?#Paresthesias ?S: Complains of tingling in his hands and fingers on both hands. Decreased sensation in both hands- feels like there is a layer of glue on the fingertips. Has dropped pills which is new. Cant click toothbrush . Strength is ok. Hurting in right upper arm. No pain in left upper arm. No pain in neck. Never had anything like this before. Constant in all positions. Includes pinky and actually started there ?- no weakness or paresthesias in the legs.  ? ?- may have been going on longer than this because reached out ot Dr. Carles Collet on 09/28/21 - advised cock up wrist splints- he had struggled with wearing these due  to discomfort- minimal help ?Lab Results  ?Component Value Date  ? [id number] 310 04/09/2015  ? ?Lab Results  ?Component Value Date  ? TSH 1.42 06/19/2018  ?A/P: Paresthesias of unknown origin-no significant neck or shoulder pain but does have some right upper arm pain-we will check some basic neuropathy labs including B12, TSH, A1c.  We also discussed possible x-rays of the neck but he would like to hold off on that for now-I did even consider doing an MRI of the cervical spine to evaluate further. Lack of improvement with cock up wrist splint and fact includes the pinky points away from carpal tunnel to me.  ?- has follow up with Dr. Carles Collet in September- I would also consider sports medicine consult  ? ?#Parkinson's plus syndrome-followed by Dr.  Carles Collet of [facility] [facility] neurology ?S: Medication: Carbidopa levodopa 25-100 mg-patient takes 2 tablets at 7 AM, 11 AM, 3 PM and 7 PM. Caregiver 7  days a week now. Primarily  ?moved with motorized wheelchair-significant setback early 2023/late 2022-was able to recover short-term but has progressed back to primarily wheelchair dependent again as of January visit at least. Is able ot stand with heavy assist with Clarise Cruz stedy ? ?Patient also had some incontinence issues- had some pressure ulcers last visit- home health came out nad was helpful.  ? ?Also doing speech therapy now for dysarthria related to parkinsons- has found this helpful.  ?A/P: Parkinson's Plus- continue to follow up with neurology. Certainly needs Denna Haggard device (DME written for this for reimbursement) to assist with transfers to toilet or chair to chair or into bed etc.  Continue current medications.  ? ?#Lymphedema with history of malignant melanoma left leg ?S: Severe lymphedema after history of malignant melanoma in the left leg. Had required treatment at lymphedema clinic with [facility] in the past. Compression stockings have been helpful but needs updated pair  ?A/P: lymphedema with ongoing needs for compression stockings single net 20-30 mmhg- will give DME rx today  ? ?#hypertension ?S: medication: None at this point-had been taken off medication ?BP Readings from Last 3 Encounters:  ?11/22/21 130/70  ?07/26/21 126/78  ?05/24/21 120/72  ?A/P: Blood pressures controlled without medication-continue current medicine ? ?#hyperlipidemia ?S: Medication: Crestor 10 mg ?Lab Results  ?Component Value Date  ? CHOL 119 05/24/2021  ? HDL 35.00 (L) 05/24/2021  ? LDLCALC 45 05/24/2021  ? LDLDIRECT 50.0 05/03/2016  ? TRIG 192.0 (H) 05/24/2021  ? CHOLHDL 3 05/24/2021  ? A/P: Lipids been well controlled-continue current medication ? ?#OSA-advised to be compliant with CPAP- but essentially sits up with sleep and not snoring  ? ?# Hyperglycemia/insulin  resistance/prediabetes-peak A1c 5.29 September 2019 ?S: Medication: None ?Lab Results  ?Component Value Date  ? HGBA1C 5.8 (H) 10/03/2019  ? HGBA1C 5.6 02/09/2017  ? HGBA1C 5.7 05/03/2016  ? A/P: Patient overdue for A1c repeat-offered to check this today especially with paresthesias  ? ?#BPH associated with nocturia/OAB- no Rx as he had orthostasis with Flomax - now on gemtessa- states still on this but with cost he may stop ? ?#senile purpura- noted on arms. Stable. Check cbc at least annually ? ?Recommended follow up: Return in about 4 months (around 03/25/2022) for physical or sooner if needed.Schedule b4 you leave. ?Future Appointments  ?Date Time Provider [place]  ?11/23/2021  3:30 PM Sharen Counter, CCC-SLP [facility]  ?03/28/2022  3:30 PM [name], [name], DO [facility] None  ?06/05/2022  2:30 PM [facility] HEALTH COACH [facility]  ? ? ?  Lab/Order associations: ?  ICD-10-CM   ?1. Essential hypertension  I10 CBC with Differential/Platelet  ?  Comprehensive metabolic panel  ?  Lipid panel  ?  ?2. Hyperlipidemia, unspecified hyperlipidemia type  E78.5 CBC with Differential/Platelet  ?  Comprehensive metabolic panel  ?  TSH  ?  Lipid panel  ?  ?3. 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  ?4. Lymphedema  I89.0 For home use only DME Other see comment  ?  ?5. Senile purpura (HCC) Chronic D69.2   ?  ?6. Hyperglycemia  R73.9 Hemoglobin A1c  ?  ?7. Paresthesias  R20.2 Vitamin B12  ?  ? ? ?No orders of the defined types were placed in this encounter. ? ? ?Return precautions advised.  ?[name], MD ? ? ?

## 2021-10-11 ENCOUNTER — Other Ambulatory Visit: Payer: Self-pay

## 2021-10-11 ENCOUNTER — Ambulatory Visit: Payer: PPO

## 2021-10-11 DIAGNOSIS — R471 Dysarthria and anarthria: Secondary | ICD-10-CM

## 2021-10-11 DIAGNOSIS — R1310 Dysphagia, unspecified: Secondary | ICD-10-CM

## 2021-10-12 NOTE — Therapy (Signed)
[place] ?[facility] ?[address] ?[place], [place], [address] ?Phone: [phone]   Fax:  [phone] ? ?Speech Language Pathology Treatment ? ?Patient Details  ?Name: Nicholas [name] ?MRN: [id number] ?Date of Birth: Jun 27, 1946 ?Referring Provider (SLP): [name], Wells Guiles, DO ? ? ?Encounter Date: 10/11/2021 ? ? End of Session - 10/12/21 0008   ? ? Visit Number 2   ? Number of Visits 17   ? Date for SLP Re-Evaluation 12/02/21   ? SLP Start Time 1450   ? SLP Stop Time  1530   ? SLP Time Calculation (min) 40 min   ? Activity Tolerance Patient tolerated treatment well   ? ?  ?  ? ?  ? ? ?Past Medical History:  ?Diagnosis Date  ? Cancer [facility]) Jan 2008  ? 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 Hyperlipidemia   ? Hypertension   ? Lymphedema   ? ? ?Past Surgical History:  ?Procedure Laterality Date  ? 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 removal of melanoma of left foot/amputation of toes 1&25 Jul 2006  ? WRIST FRACTURE SURGERY    ? 76 years old-set  ? ? ?There were no vitals filed for this visit. ? ? ? ? ? ? ? ? ? ADULT SLP TREATMENT - 10/11/21 2339   ? ?  ? General Information  ? Behavior/Cognition Alert;Cooperative;Pleasant mood   ?  ? Treatment Provided  ? Treatment provided Cognitive-Linquistic   ?  ? Cognitive-Linquistic Treatment  ? Treatment focused on Dysarthria   ? Skilled Treatment Loud /a/ targeted to habitualize louder conversational speech - SLP req'd to provide mod-max cues consistently for full breath. SLP will need to continue to monitor. [name] brought 10 sentences but 3 were not said by him every day so SLP omitted these and asked pt to think of 1-3 more sentences. SLP req'd to provide max cues with word/sentence responses for volume and for full breath. Modeling was necessary.   ?  ? Assessment / Recommendations / Plan  ? Plan Continue with current plan of care   ?  ? Progression Toward Goals  ? Progression toward goals Progressing toward goals   ? ?   ?  ? ?  ? ? ? SLP Education - 10/12/21 0003   ? ? Education Details loud /a/ loudness and breathing, everyday sentences loudness and breathing, rationale for loud /a/, and rationale for everyday sentences   ? Person(s) Educated Patient   ? Methods Explanation;Demonstration;Verbal cues   ? Comprehension Returned demonstration;Verbalized understanding;Need further instruction;Verbal cues required   ? ?  ?  ? ?  ? ? ? SLP Short Term Goals - 10/12/21 0010   ? ?  ? SLP SHORT TERM GOAL #1  ? Title pt will sustain loud /a/ at >90 dB average at 30 cm over 3  sessions   ? Time 4   ? Period Weeks   ? Status On-going   ? Target Date 11/04/21   ?  ? SLP SHORT TERM GOAL #2  ? Title pt will exhibit volume of 70dB in sentence responses 90% with rare min A over 3 sessions   ? Time 4   ? Period Weeks   ? Status On-going   ? Target Date 11/04/21   ?  ? SLP SHORT TERM GOAL #3  ? Title In 3 minutes simple conversation pt will achieve average speech volume of upper 60s-low  70s dB with occasional min cues over two sessions   ? Time 4   ? Period Weeks   ? Status On-going   ? Target Date 11/04/21   ?  ? SLP SHORT TERM GOAL #4  ? Title pt will undergo instrumental swallow eval if clinically indicated   ? Time 4   ? Period Weeks   ? Status On-going   ? Target Date 11/04/21   ?  ? SLP SHORT TERM GOAL #5  ? Title pt will use breath appropriate for 3 minutes simple conversation 90% of the time in 3 sessions   ? Time 4   ? Period Weeks   ? Status On-going   ? Target Date 11/04/21   ? ?  ?  ? ?  ? ? ? SLP Long Term Goals - 10/12/21 0010   ? ?  ? SLP LONG TERM GOAL #1  ? Title pt will sustain loud /a/ at average >90dB at 30 cm over 6  sessions   ? Time 8   ? Period Weeks   or 17 total sessions - for all LTGs  ? Status On-going   ? Target Date 12/02/21   ?  ? SLP LONG TERM GOAL #2  ? Title pt will generate speech in 6 minutes simple to mod complex conversation with average upper 60s-low 70s dB average over three sessions   ? Time 8   ? Period  Weeks   ? Status On-going   ? Target Date 12/02/21   ?  ? SLP LONG TERM GOAL #3  ? Title pt will use breath appropriate for 6 minutes simple-mod complex conversation 80% of the time in 3 sessions   ? Time 6   ? Period Weeks   ? Status On-going   ? Target Date 12/02/21   ?  ? SLP LONG TERM GOAL #4  ? Title pt wife will demo appropriate cues for pt to maximize loudness in 5 mintues conversation with her in 2 sessions   ? Time 8   ? Period Weeks   ? Status On-going   ? Target Date 12/02/21   ?  ? SLP LONG TERM GOAL #5  ? Title pt will report a better outcome with communication than in the first week of therapy, on a PROM taken in one of the last 2 sessions   ? Time 8   ? Period Weeks   ? Status On-going   ? Target Date 12/02/21   ? ?  ?  ? ?  ? ? ? Plan - 10/12/21 0009   ? ? 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 Pt also reports wordfinding difficulties but not as frequent as previous therapy course. SLP to inquire about pt's swallowing on next visit. His weight has been stable. I recommend skilled ST to address dysarthria, and possibly cognitive-communication skills and dysphagia. SLP to monitor diet tolerance; will request MBS orders and schedule objective testing if warranted.   ? Speech Therapy Frequency 2x / week   ? Duration 8 weeks   ? Treatment/Interventions Aspiration precaution training;Diet toleration management by SLP;Trials of upgraded texture/liquids;Cognitive reorganization;Multimodal communcation approach;Compensatory strategies;Pharyngeal strengthening exercises;Language facilitation;Compensatory techniques;Cueing hierarchy;Internal/external aids;Functional tasks;SLP instruction and feedback;Patient/family education;Other (comment)   MBS  ? Potential to Achieve Goals Good   ? SLP Home Exercise Plan loud /a/   ?  Consulted and  Agree with Plan of Care Patient   ? ?  ?  ? ?  ? ? ?Patient will benefit from skilled therapeutic intervention in order to improve the following deficits and impairments:   ?Dysarthria and anarthria ? ?Dysphagia, unspecified type ? ? ? ?Problem List ?Patient Active Problem List  ? Diagnosis Date Noted  ? Parkinson's disease ([place]) 01/11/2021  ? Sepsis without acute organ dysfunction (HCC)   ? Cellulitis 04/12/2020  ? Strain of right biceps 09/12/2017  ? Lymphedema 02/10/2017  ? BPH associated with nocturia 05/16/2016  ? Senile purpura ([place]) 05/03/2016  ? OSA (obstructive sleep apnea) 12/28/2015  ? Hypersomnia 11/15/2015  ? Cough 11/15/2015  ? 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 Dizziness 12/30/2014  ? Diastolic dysfunction 17/51/0258  ? Former smoker 04/29/2014  ? Chronic venous insufficiency 02/20/2014  ? Hyperglycemia 08/02/2012  ? Morbid obesity ([place]) 07/13/2010  ? History of malignant melanoma. left leg 03/09/2009  ? Insomnia 10/03/2007  ? Hyperlipidemia 12/13/2006  ? Essential hypertension 12/13/2006  ? ? ?[place], [place] ?10/12/2021, 12:11 AM ? ?[place] ?[facility] ?[address], [address] ?[place], [place], [address] ?Phone: [phone]   Fax:  [phone] ? ? ?Name: CARDALE DORER ?MRN: [id number] ?Date of Birth: 1946/06/19 ? ?

## 2021-10-13 ENCOUNTER — Other Ambulatory Visit: Payer: Self-pay

## 2021-10-13 ENCOUNTER — Ambulatory Visit: Payer: PPO

## 2021-10-13 DIAGNOSIS — R1310 Dysphagia, unspecified: Secondary | ICD-10-CM

## 2021-10-13 DIAGNOSIS — R471 Dysarthria and anarthria: Secondary | ICD-10-CM | POA: Diagnosis not present

## 2021-10-13 NOTE — Therapy (Signed)
[place] ?[facility] ?[address], [address] ?[place], [place], [address] ?Phone: [phone]   Fax:  [phone] ? ?Speech Language Pathology Treatment ? ?Patient Details  ?Name: Nicholas [name] ?MRN: [id number] ?Date of Birth: 05/31/1946 ?Referring Provider (SLP): [name], Wells Guiles, DO ? ? ?Encounter Date: 10/13/2021 ? ? End of Session - 10/13/21 1542   ? ? Visit Number 3   ? Number of Visits 17   ? Date for SLP Re-Evaluation 12/02/21   ? SLP Start Time 1450   ? SLP Stop Time  1530   ? SLP Time Calculation (min) 40 min   ? Activity Tolerance Patient tolerated treatment well   ? ?  ?  ? ?  ? ? ?Past Medical History:  ?Diagnosis Date  ? Cancer [facility]) Jan 2008  ? 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 Hyperlipidemia   ? Hypertension   ? Lymphedema   ? ? ?Past Surgical History:  ?Procedure Laterality Date  ? 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 removal of melanoma of left foot/amputation of toes 1&25 Jul 2006  ? WRIST FRACTURE SURGERY    ? 76 years old-set  ? ? ?There were no vitals filed for this visit. ? ? Subjective Assessment - 10/13/21 1452   ? ? Subjective "HAd a bad day yesterday- got frozen and couldn't move. Carried over to this morning."   ? Currently in Pain? No/denies   ? ?  ?  ? ?  ? ? ? ? ? ? ? ? ADULT SLP TREATMENT - 10/13/21 1453   ? ?  ? General Information  ? Behavior/Cognition Alert;Cooperative   ?  ? Treatment Provided  ? Treatment provided Cognitive-Linquistic   ?  ? Cognitive-Linquistic Treatment  ? Treatment focused on Dysarthria   ? Skilled Treatment "I did all the "ah"s." Loud /a/ targeted to habitualize louder conversational speech - SLP req'd to provide written/picture cues consistently for full breath a- average low 90s dB.SLP cued pt to then do his everyday sentences, which req'd SLP to provide consistent mod-max A for full breath and loudness- modeling necessary. Nicholas [name] did not have additional sentences added like SLP asked him to do last session.  He then read pharase level information with mod-max cues usually for full breath and loudness, average mid 60s dB. Usual modeling necessary. SLP encouraged pt to restate utterances he speaks on residual volume, and he spontaneously restated utterance he spoke on residual volume x1.   ?  ? Assessment / Recommendations / Plan  ? Plan Continue with current plan of care   ?  ? Progression Toward Goals  ? Progression toward goals Progressing toward goals   SLOW progress  ? ?  ?  ? ?  ? ? ? ? ? SLP Short Term Goals - 10/13/21 1543   ? ?  ? SLP SHORT TERM GOAL #1  ? Title pt will sustain loud /a/ at >90 dB average at 30 cm over 3  sessions   ? Baseline 10-13-21   ? Time 4   ? Period Weeks   ? Status On-going   ? Target Date 11/04/21   ?  ? SLP SHORT TERM GOAL #2  ? Title pt will exhibit volume of 70dB in sentence responses 90% with rare min A over 3 sessions   ? Time 4   ? Period Weeks   ? Status On-going   ? Target Date 11/04/21   ?  ?  SLP SHORT TERM GOAL #3  ? Title In 3 minutes simple conversation pt will achieve average speech volume of upper 60s-low 70s dB with occasional min cues over two sessions   ? Time 4   ? Period Weeks   ? Status On-going   ? Target Date 11/04/21   ?  ? SLP SHORT TERM GOAL #4  ? Title pt will undergo instrumental swallow eval if clinically indicated   ? Time 4   ? Period Weeks   ? Status On-going   ? Target Date 11/04/21   ?  ? SLP SHORT TERM GOAL #5  ? Title pt will use breath appropriate for 3 minutes simple conversation 90% of the time in 3 sessions   ? Time 4   ? Period Weeks   ? Status On-going   ? Target Date 11/04/21   ? ?  ?  ? ?  ? ? ? SLP Long Term Goals - 10/13/21 1544   ? ?  ? SLP LONG TERM GOAL #1  ? Title pt will sustain loud /a/ at average >90dB at 30 cm over 6  sessions   ? Time 8   ? Period Weeks   or 17 total sessions - for all LTGs  ? Status On-going   ? Target Date 12/02/21   ?  ? SLP LONG TERM GOAL #2  ? Title pt will generate speech in 6 minutes simple to mod complex  conversation with average upper 60s-low 70s dB average over three sessions   ? Time 8   ? Period Weeks   ? Status On-going   ? Target Date 12/02/21   ?  ? SLP LONG TERM GOAL #3  ? Title pt will use breath appropriate for 6 minutes simple-mod complex conversation 80% of the time in 3 sessions   ? Time 6   ? Period Weeks   ? Status On-going   ? Target Date 12/02/21   ?  ? SLP LONG TERM GOAL #4  ? Title pt wife will demo appropriate cues for pt to maximize loudness in 5 mintues conversation with her in 2 sessions   ? Time 8   ? Period Weeks   ? Status On-going   ? Target Date 12/02/21   ?  ? SLP LONG TERM GOAL #5  ? Title pt will report a better outcome with communication than in the first week of therapy, on a PROM taken in one of the last 2 sessions   ? Time 8   ? Period Weeks   ? Status On-going   ? Target Date 12/02/21   ? ?  ?  ? ?  ? ? ? Plan - 10/13/21 1542   ? ? 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 Pt also reports wordfinding difficulties but not as frequent as previous therapy course. SLP to inquire about pt's swallowing on his next visit. His weight has been stable. I recommend skilled ST to address dysarthria, and possibly cognitive-communication skills and dysphagia. SLP to monitor diet tolerance; will request MBS orders and schedule objective testing if warranted.   ? Speech Therapy Frequency 2x / week   ? Duration 8 weeks   ? Treatment/Interventions Aspiration precaution training;Diet toleration management by SLP;Trials of upgraded texture/liquids;Cognitive reorganization;Multimodal communcation approach;Compensatory strategies;Pharyngeal strengthening exercises;Language facilitation;Compensatory techniques;Cueing hierarchy;Internal/external aids;Functional tasks;SLP instruction and feedback;Patient/family  education;Other (comment)  MBS  ? Potential to Achieve Goals Good   ? SLP Home Exercise Plan loud /a/   ? Consulted and Agree with Plan of Care Patient   ? ?  ?  ? ?  ? ? ?Patient will benefit from skilled therapeutic intervention in order to improve the following deficits and impairments:   ?Dysarthria and anarthria ? ?Dysphagia, unspecified type ? ? ? ?Problem List ?Patient Active Problem List  ? Diagnosis Date Noted  ? Parkinson's disease ([place]) 01/11/2021  ? Sepsis without acute organ dysfunction (HCC)   ? Cellulitis 04/12/2020  ? Strain of right biceps 09/12/2017  ? Lymphedema 02/10/2017  ? BPH associated with nocturia 05/16/2016  ? Senile purpura ([place]) 05/03/2016  ? OSA (obstructive sleep apnea) 12/28/2015  ? Hypersomnia 11/15/2015  ? Cough 11/15/2015  ? 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 Dizziness 12/30/2014  ? Diastolic dysfunction 02/33/4356  ? Former smoker 04/29/2014  ? Chronic venous insufficiency 02/20/2014  ? Hyperglycemia 08/02/2012  ? Morbid obesity ([place]) 07/13/2010  ? History of malignant melanoma. left leg 03/09/2009  ? Insomnia 10/03/2007  ? Hyperlipidemia 12/13/2006  ? Essential hypertension 12/13/2006  ? ? ?[place], [place] ?10/13/2021, 3:44 PM ? ?[place] ?[facility] ?[address] ?[place], [place], [address] ?Phone: [phone]   Fax:  [phone] ? ? ?Name: Nicholas [name] ?MRN: [id number] ?Date of Birth: 1946/06/03 ? ?

## 2021-10-13 NOTE — Patient Instructions (Signed)
?  Handout of [place] football schedule for pt to practice opponents, dates, and locations with full breath and loud speech ?

## 2021-10-18 ENCOUNTER — Ambulatory Visit: Payer: PPO

## 2021-10-20 ENCOUNTER — Ambulatory Visit: Payer: PPO

## 2021-10-20 DIAGNOSIS — R471 Dysarthria and anarthria: Secondary | ICD-10-CM

## 2021-10-20 DIAGNOSIS — R1310 Dysphagia, unspecified: Secondary | ICD-10-CM

## 2021-10-20 NOTE — Patient Instructions (Signed)
? ? ? ?  EVERYDAY SENTENCES: ? ?Nicholas [name] where are you? ? ?Nicholas [name] what's for dinner? ? ?Nicholas [name] where are you? ? ?Nicholas [name] be quiet! ? ?Nicholas [name] wake up! ? ?Have you been to the bank? ? ?What's on TV? ? ?Put it on [place]. ? ?Nicholas [name]! ? ?Nicholas [name] I need to go to the brown chair. ?

## 2021-10-20 NOTE — Therapy (Signed)
[place] ?[facility] ?[address], [address] ?[place], [place], [address] ?Phone: [phone]   Fax:  [phone] ? ?Speech Language Pathology Treatment ? ?Patient Details  ?Name: Nicholas [name] ?MRN: [id number] ?Date of Birth: 1946/05/11 ?Referring Provider (SLP): [name], Wells Guiles, DO ? ? ?Encounter Date: 10/20/2021 ? ? End of Session - 10/20/21 1650   ? ? Visit Number 4   ? Number of Visits 17   ? Date for SLP Re-Evaluation 12/02/21   ? SLP Start Time 6962   ? SLP Stop Time  1530   ? SLP Time Calculation (min) 41 min   ? Activity Tolerance Patient tolerated treatment well   ? ?  ?  ? ?  ? ? ?Past Medical History:  ?Diagnosis Date  ? Cancer [facility]) Jan 2008  ? 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 Hyperlipidemia   ? Hypertension   ? Lymphedema   ? ? ?Past Surgical History:  ?Procedure Laterality Date  ? 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 removal of melanoma of left foot/amputation of toes 1&25 Jul 2006  ? WRIST FRACTURE SURGERY    ? 76 years old-set  ? ? ?There were no vitals filed for this visit. ? ? Subjective Assessment - 10/20/21 1642   ? ? Subjective "No difference" (with wife asking him to repeat)   ? Currently in Pain? No/denies   ? ?  ?  ? ?  ? ? ? ? ? ? ? ? ADULT SLP TREATMENT - 10/20/21 1642   ? ?  ? General Information  ? Behavior/Cognition Alert;Cooperative;Pleasant mood   ?  ? Treatment Provided  ? Treatment provided Cognitive-Linquistic   ?  ? Cognitive-Linquistic Treatment  ? Treatment focused on Dysarthria   ? Skilled Treatment SLP observed pt drink two cups of water (three sips were with meds, whole) during session today without any overt s/sx oral dysphagia, nor pharyngeal dysphagia. "I memorized the [place] football schedule" - recited to SLP with extra time and loudness from low to upper 60s dB with average 65-66 dB. SLP directed pt to complete loud /a/ in which average was low-mid 80s dB, with mod cues occasionally for full breath. Everyday  sentences were read with consistent mod-max cues for full breath and for loudness. SLP engaged pt/educated pt again about the need for full breath and rationale for why he is not taking full breaths. SLP had pt read sentences again and usual mod cues were necessary for full breath and min A rarely for loudness. SLP highlighted pt's loudness improved when pt took more air. SLP challenged pt to use "Pam!" sentence with a full breath, in the shower when pt has to call caregiver to assist - he said usually takes three trials before she hears him - SLP "bet" pt it would be less than three if he took adequate breath prior to shouting "Pam!"   ?  ? Assessment / Recommendations / Plan  ? Plan Continue with current plan of care   ?  ? Progression Toward Goals  ? Progression toward goals Progressing toward goals   ? ?  ?  ? ?  ? ? ? SLP Education - 10/20/21 1650   ? ? Education Details full breath, full breath and interplay with louder speech   ? Person(s) Educated Patient   ? Methods Explanation;Demonstration;Verbal cues   ? Comprehension Verbalized understanding;Returned demonstration;Verbal cues required;Need further instruction   ? ?  ?  ? ?  ? ? ?  SLP Short Term Goals - 10/20/21 1651   ? ?  ? SLP SHORT TERM GOAL #1  ? Title pt will sustain loud /a/ at >90 dB average at 30 cm over 3  sessions   ? Baseline 10-13-21   ? Time 4   ? Period Weeks   ? Status On-going   ? Target Date 11/04/21   ?  ? SLP SHORT TERM GOAL #2  ? Title pt will exhibit volume of 70dB in sentence responses 90% with rare min A over 3 sessions   ? Time 4   ? Period Weeks   ? Status On-going   ? Target Date 11/04/21   ?  ? SLP SHORT TERM GOAL #3  ? Title In 3 minutes simple conversation pt will achieve average speech volume of upper 60s-low 70s dB with occasional min cues over two sessions   ? Time 4   ? Period Weeks   ? Status On-going   ? Target Date 11/04/21   ?  ? SLP SHORT TERM GOAL #4  ? Title pt will undergo instrumental swallow eval if clinically  indicated   ? Time 4   ? Period Weeks   ? Status On-going   ? Target Date 11/04/21   ?  ? SLP SHORT TERM GOAL #5  ? Title pt will use breath appropriate for 3 minutes simple conversation 90% of the time in 3 sessions   ? Time 4   ? Period Weeks   ? Status On-going   ? Target Date 11/04/21   ? ?  ?  ? ?  ? ? ? SLP Long Term Goals - 10/20/21 1651   ? ?  ? SLP LONG TERM GOAL #1  ? Title pt will sustain loud /a/ at average >90dB at 30 cm over 6  sessions   ? Time 8   ? Period Weeks   or 17 total sessions - for all LTGs  ? Status On-going   ? Target Date 12/02/21   ?  ? SLP LONG TERM GOAL #2  ? Title pt will generate speech in 6 minutes simple to mod complex conversation with average upper 60s-low 70s dB average over three sessions   ? Time 8   ? Period Weeks   ? Status On-going   ? Target Date 12/02/21   ?  ? SLP LONG TERM GOAL #3  ? Title pt will use breath appropriate for 6 minutes simple-mod complex conversation 80% of the time in 3 sessions   ? Time 6   ? Period Weeks   ? Status On-going   ? Target Date 12/02/21   ?  ? SLP LONG TERM GOAL #4  ? Title pt wife will demo appropriate cues for pt to maximize loudness in 5 mintues conversation with her in 2 sessions   ? Time 8   ? Period Weeks   ? Status On-going   ? Target Date 12/02/21   ?  ? SLP LONG TERM GOAL #5  ? Title pt will report a better outcome with communication than in the first week of therapy, on a PROM taken in one of the last 2 sessions   ? Time 8   ? Period Weeks   ? Status On-going   ? Target Date 12/02/21   ? ?  ?  ? ?  ? ? ? Plan - 10/20/21 1650   ? ? Clinical Impression Statement Nicholas [name] presents with hypokinetic dysarthria characterized by  reduced vocal intensity and decreased respiratory support for speech resulting in rare aphonia, secondary to Parkinson's/PSP. Pt also reports wordfinding difficulties but not as frequent as previous therapy course. SLP to inquire about pt's swallowing on his next visit. His weight has been stable. I  recommend skilled ST to address dysarthria, and possibly cognitive-communication skills and dysphagia. SLP to monitor diet tolerance; will request MBS orders and schedule objective testing if warranted.   ? Speech Therapy Frequency 2x / week   ? Duration 8 weeks   ? Treatment/Interventions Aspiration precaution training;Diet toleration management by SLP;Trials of upgraded texture/liquids;Cognitive reorganization;Multimodal communcation approach;Compensatory strategies;Pharyngeal strengthening exercises;Language facilitation;Compensatory techniques;Cueing hierarchy;Internal/external aids;Functional tasks;SLP instruction and feedback;Patient/family education;Other (comment)   MBS  ? Potential to Achieve Goals Good   ? SLP Home Exercise Plan loud /a/   ? Consulted and Agree with Plan of Care Patient   ? ?  ?  ? ?  ? ? ?Patient will benefit from skilled therapeutic intervention in order to improve the following deficits and impairments:   ?Dysarthria and anarthria ? ?Dysphagia, unspecified type ? ? ? ?Problem List ?Patient Active Problem List  ? Diagnosis Date Noted  ? Parkinson's disease ([place]) 01/11/2021  ? Sepsis without acute organ dysfunction (HCC)   ? Cellulitis 04/12/2020  ? Strain of right biceps 09/12/2017  ? Lymphedema 02/10/2017  ? BPH associated with nocturia 05/16/2016  ? Senile purpura ([place]) 05/03/2016  ? OSA (obstructive sleep apnea) 12/28/2015  ? Hypersomnia 11/15/2015  ? Cough 11/15/2015  ? 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 Dizziness 12/30/2014  ? Diastolic dysfunction 17/49/4496  ? Former smoker 04/29/2014  ? Chronic venous insufficiency 02/20/2014  ? Hyperglycemia 08/02/2012  ? Morbid obesity ([place]) 07/13/2010  ? History of malignant melanoma. left leg 03/09/2009  ? Insomnia 10/03/2007  ? Hyperlipidemia 12/13/2006  ? Essential hypertension 12/13/2006  ? ? ?[place], [place] ?10/20/2021, 4:52 PM ? ?[place] ?[facility] ?[address], [address] ?[place],  [place], [address] ?Phone: [phone]   Fax:  [phone] ? ? ?Name: Nicholas [name] ?MRN: [id number] ?Date of Birth: 1945/11/13 ? ?

## 2021-10-25 ENCOUNTER — Ambulatory Visit: Payer: PPO | Attending: Neurology

## 2021-10-25 DIAGNOSIS — R41841 Cognitive communication deficit: Secondary | ICD-10-CM | POA: Insufficient documentation

## 2021-10-25 DIAGNOSIS — R1310 Dysphagia, unspecified: Secondary | ICD-10-CM | POA: Insufficient documentation

## 2021-10-25 DIAGNOSIS — R471 Dysarthria and anarthria: Secondary | ICD-10-CM | POA: Diagnosis not present

## 2021-10-25 DIAGNOSIS — R4701 Aphasia: Secondary | ICD-10-CM | POA: Diagnosis not present

## 2021-10-25 NOTE — Therapy (Signed)
[place] ?[facility] ?[address], [address] ?[place], [place], [address] ?Phone: [phone]   Fax:  [phone] ? ?Speech Language Pathology Treatment ? ?Patient Details  ?Name: Nicholas [name] ?MRN: [id number] ?Date of Birth: 02-07-1946 ?Referring Provider (SLP): [name], Wells Guiles, DO ? ? ?Encounter Date: 10/25/2021 ? ? End of Session - 10/25/21 1636   ? ? Visit Number 5   ? Number of Visits 17   ? Date for SLP Re-Evaluation 12/02/21   ? SLP Start Time 1458   13 minutes late  ? SLP Stop Time  1530   ? SLP Time Calculation (min) 32 min   ? Activity Tolerance Patient tolerated treatment well   ? ?  ?  ? ?  ? ? ?Past Medical History:  ?Diagnosis Date  ? Cancer [facility]) Jan 2008  ? 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 Hyperlipidemia   ? Hypertension   ? Lymphedema   ? ? ?Past Surgical History:  ?Procedure Laterality Date  ? 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 removal of melanoma of left foot/amputation of toes 1&25 Jul 2006  ? WRIST FRACTURE SURGERY    ? 76 years old-set  ? ? ?There were no vitals filed for this visit. ? ? Subjective Assessment - 10/25/21 1459   ? ? Subjective "You'll be surprised. I yelled for Pam and she came - first time."   ? Currently in Pain? No/denies   ? ?  ?  ? ?  ? ? ? ? ? ? ? ? ADULT SLP TREATMENT - 10/25/21 1500   ? ?  ? General Information  ? Behavior/Cognition Alert;Cooperative;Pleasant mood   ?  ? Treatment Provided  ? Treatment provided Cognitive-Linquistic   ?  ? Cognitive-Linquistic Treatment  ? Treatment focused on Dysarthria   ? Skilled Treatment SLP directed pt to complete loud /a/ in which average was mid 80s dB with mod cues occasionally for full breath. Everyday sentences were read with consistent mod cues for full breath and usual mod cues for loudness. PT told SLP he was playing poker tonight and SLP had pt practice 3 phrases he would use tonight "Check" "make it 2" and "I'll call" - pt with success at WNL volume 75% of the time;  mod A rarely for loudness and mod A occasionally for full breath.   ?  ? Assessment / Recommendations / Plan  ? Plan Continue with current plan of care   ?  ? Progression Toward Goals  ? Progression toward goals Progressing toward goals   ? ?  ?  ? ?  ? ? ? ? ? SLP Short Term Goals - 10/25/21 1638   ? ?  ? SLP SHORT TERM GOAL #1  ? Title pt will sustain loud /a/ at >90 dB average at 30 cm over 3  sessions   ? Baseline 10-13-21   ? Time 4   ? Period Weeks   ? Status On-going   ? Target Date 11/04/21   ?  ? SLP SHORT TERM GOAL #2  ? Title pt will exhibit volume of 70dB in sentence responses 90% with rare min A over 3 sessions   ? Time 4   ? Period Weeks   ? Status On-going   ? Target Date 11/04/21   ?  ? SLP SHORT TERM GOAL #3  ? Title In 3 minutes simple conversation pt will achieve average speech volume  of upper 60s-low 70s dB with occasional min cues over two sessions   ? Time 4   ? Period Weeks   ? Status On-going   ? Target Date 11/04/21   ?  ? SLP SHORT TERM GOAL #4  ? Title pt will undergo instrumental swallow eval if clinically indicated   ? Time 4   ? Period Weeks   ? Status On-going   ? Target Date 11/04/21   ?  ? SLP SHORT TERM GOAL #5  ? Title pt will use breath appropriate for 3 minutes simple conversation 90% of the time in 3 sessions   ? Time 4   ? Period Weeks   ? Status On-going   ? Target Date 11/04/21   ? ?  ?  ? ?  ? ? ? SLP Long Term Goals - 10/25/21 1638   ? ?  ? SLP LONG TERM GOAL #1  ? Title pt will sustain loud /a/ at average >90dB at 30 cm over 6  sessions   ? Time 8   ? Period Weeks   or 17 total sessions - for all LTGs  ? Status On-going   ? Target Date 12/02/21   ?  ? SLP LONG TERM GOAL #2  ? Title pt will generate speech in 6 minutes simple to mod complex conversation with average upper 60s-low 70s dB average over three sessions   ? Time 8   ? Period Weeks   ? Status On-going   ? Target Date 12/02/21   ?  ? SLP LONG TERM GOAL #3  ? Title pt will use breath appropriate for 6 minutes  simple-mod complex conversation 80% of the time in 3 sessions   ? Time 6   ? Period Weeks   ? Status On-going   ? Target Date 12/02/21   ?  ? SLP LONG TERM GOAL #4  ? Title pt wife will demo appropriate cues for pt to maximize loudness in 5 mintues conversation with her in 2 sessions   ? Time 8   ? Period Weeks   ? Status On-going   ? Target Date 12/02/21   ?  ? SLP LONG TERM GOAL #5  ? Title pt will report a better outcome with communication than in the first week of therapy, on a PROM taken in one of the last 2 sessions   ? Time 8   ? Period Weeks   ? Status On-going   ? Target Date 12/02/21   ? ?  ?  ? ?  ? ? ? Plan - 10/25/21 1637   ? ? 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 SLP has not noted word finding difficulties to date, in conversation with SLP. SLP to inquire about pt's swallowing on his next visit. His weight has been stable. I recommend skilled ST to address dysarthria, and possibly cognitive-communication skills and dysphagia. SLP to monitor diet tolerance; will request MBS orders and schedule objective testing if warranted.   ? Speech Therapy Frequency 2x / week   ? Duration 8 weeks   ? Treatment/Interventions Aspiration precaution training;Diet toleration management by SLP;Trials of upgraded texture/liquids;Cognitive reorganization;Multimodal communcation approach;Compensatory strategies;Pharyngeal strengthening exercises;Language facilitation;Compensatory techniques;Cueing hierarchy;Internal/external aids;Functional tasks;SLP instruction and feedback;Patient/family education;Other (comment)   MBS  ? Potential to Achieve Goals Good   ? SLP Home Exercise Plan loud /a/   ?  Consulted and Agree with Plan of Care Patient   ? ?  ?  ? ?  ? ? ?Patient will benefit from skilled therapeutic intervention in order to  improve the following deficits and impairments:   ?Dysarthria and anarthria ? ?Dysphagia, unspecified type ? ? ? ?Problem List ?Patient Active Problem List  ? Diagnosis Date Noted  ? Parkinson's disease ([place]) 01/11/2021  ? Sepsis without acute organ dysfunction (HCC)   ? Cellulitis 04/12/2020  ? Strain of right biceps 09/12/2017  ? Lymphedema 02/10/2017  ? BPH associated with nocturia 05/16/2016  ? Senile purpura ([place]) 05/03/2016  ? OSA (obstructive sleep apnea) 12/28/2015  ? Hypersomnia 11/15/2015  ? Cough 11/15/2015  ? 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 Dizziness 12/30/2014  ? Diastolic dysfunction 07/12/7587  ? Former smoker 04/29/2014  ? Chronic venous insufficiency 02/20/2014  ? Hyperglycemia 08/02/2012  ? Morbid obesity ([place]) 07/13/2010  ? History of malignant melanoma. left leg 03/09/2009  ? Insomnia 10/03/2007  ? Hyperlipidemia 12/13/2006  ? Essential hypertension 12/13/2006  ? ? ?[place], [place] ?10/25/2021, 4:39 PM ? ?[place] ?[facility] ?[address], [address] ?[place], [place], [address] ?Phone: [phone]   Fax:  [phone] ? ? ?Name: AURYN PAIGE ?MRN: [id number] ?Date of Birth: 08/16/1945 ? ?

## 2021-10-27 ENCOUNTER — Ambulatory Visit: Payer: PPO

## 2021-11-01 ENCOUNTER — Ambulatory Visit: Payer: PPO

## 2021-11-01 DIAGNOSIS — R4701 Aphasia: Secondary | ICD-10-CM

## 2021-11-01 DIAGNOSIS — R471 Dysarthria and anarthria: Secondary | ICD-10-CM | POA: Diagnosis not present

## 2021-11-01 DIAGNOSIS — R41841 Cognitive communication deficit: Secondary | ICD-10-CM

## 2021-11-02 NOTE — Therapy (Signed)
[place] ?[facility] ?[address], [address] ?[place], [place], [address] ?Phone: [phone]   Fax:  [phone] ? ?Speech Language Pathology Treatment ? ?Patient Details  ?Name: Nicholas [name] ?MRN: [id number] ?Date of Birth: [birth year] ?Referring Provider (SLP): [name], Wells Guiles, DO ? ? ?Encounter Date: 11/01/2021 ? ? End of Session - 11/02/21 0016   ? ? Visit Number 6   ? Number of Visits 17   ? Date for SLP Re-Evaluation 12/02/21   ? SLP Start Time 1452   ? SLP Stop Time  1531   ? SLP Time Calculation (min) 39 min   ? Activity Tolerance Patient tolerated treatment well   ? ?  ?  ? ?  ? ? ?Past Medical History:  ?Diagnosis Date  ? Cancer [facility]) Jan 2008  ? 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 Hyperlipidemia   ? Hypertension   ? Lymphedema   ? ? ?Past Surgical History:  ?Procedure Laterality Date  ? 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 removal of melanoma of left foot/amputation of toes 1&25 Jul 2006  ? WRIST FRACTURE SURGERY    ? 76 years old-set  ? ? ?There were no vitals filed for this visit. ? ? Subjective Assessment - 11/01/21 1454   ? ? Subjective Pt tells SLP he is having much better success calling caregiver at end of his shower now than before ST.   ? Currently in Pain? No/denies   ? ?  ?  ? ?  ? ? ? ? ? ? ? ? ADULT SLP TREATMENT - 11/02/21 0001   ? ?  ? General Information  ? Behavior/Cognition Alert;Cooperative;Pleasant mood   ?  ? Treatment Provided  ? Treatment provided Cognitive-Linquistic   ?  ? Cognitive-Linquistic Treatment  ? Treatment focused on Dysarthria   ? Skilled Treatment SLP used loud /a/ to habitualize adequate breath support adn loudness and pt's average was mid-upper 80s dB with mod cues rarey for full breath. Everyday sentences were read with usual mod cues for full breath and rare min-mod cues for loudness. In having pt read about the stock market, he req'd mod A occasionally for full breath and min-mod occasionally for loudness. In 10  minutes simple to mod complex conversation, pt req'd min A occasionally for full breath and min A occasionally for loudness. He did not talk on residual volume at all during this 10 minute conversation which he stated was better than usual for him.   ?  ? Assessment / Recommendations / Plan  ? Plan Continue with current plan of care   ?  ? Progression Toward Goals  ? Progression toward goals Progressing toward goals   ? ?  ?  ? ?  ? ? ? SLP Education - 11/02/21 0015   ? ? Education Details breathing is extremely important for him to be louder   ? Person(s) Educated Patient   ? Methods Explanation;Demonstration;Verbal cues   ? Comprehension Verbalized understanding;Returned demonstration;Verbal cues required;Need further instruction   ? ?  ?  ? ?  ? ? ? SLP Short Term Goals - 11/02/21 0017   ? ?  ? SLP SHORT TERM GOAL #1  ? Title pt will sustain loud /a/ at >90 dB average at 30 cm over 3  sessions   ? Baseline 10-13-21   ? Time 4   ? Period Weeks   ? Status Partially Met   ?  Target Date 11/04/21   ?  ? SLP SHORT TERM GOAL #2  ? Title pt will exhibit volume of 70dB in sentence responses 90% with rare min A over 3 sessions   ? Time 4   ? Period Weeks   ? Status Not Met   ? Target Date 11/04/21   ?  ? SLP SHORT TERM GOAL #3  ? Title In 3 minutes simple conversation pt will achieve average speech volume of upper 60s-low 70s dB with occasional min cues over two sessions   ? Time 4   ? Period Weeks   ? Status Not Met   ? Target Date 11/04/21   ?  ? SLP SHORT TERM GOAL #4  ? Title pt will undergo instrumental swallow eval if clinically indicated   ? Time 4   ? Period Weeks   ? Status Deferred   ? Target Date 11/04/21   ?  ? SLP SHORT TERM GOAL #5  ? Title pt will use breath appropriate for 3 minutes simple conversation 90% of the time in 3 sessions   ? Time 4   ? Period Weeks   ? Status Not Met   ? Target Date 11/04/21   ? ?  ?  ? ?  ? ? ? SLP Long Term Goals - 11/02/21 0018   ? ?  ? SLP LONG TERM GOAL #1  ? Title pt will  sustain loud /a/ at average >90dB at 30 cm over 6  sessions   ? Time 8   ? Period Weeks   or 17 total sessions - for all LTGs  ? Status On-going   ? Target Date 12/02/21   ?  ? SLP LONG TERM GOAL #2  ? Title pt will generate speech in 6 minutes simple to mod complex conversation with average upper 60s-low 70s dB average over three sessions   ? Time 8   ? Period Weeks   ? Status On-going   ? Target Date 12/02/21   ?  ? SLP LONG TERM GOAL #3  ? Title pt will use breath appropriate for 6 minutes simple-mod complex conversation 80% of the time in 3 sessions   ? Time 6   ? Period Weeks   ? Status On-going   ?  ? SLP LONG TERM GOAL #4  ? Title pt wife will demo appropriate cues for pt to maximize loudness in 5 mintues conversation with her in 2 sessions   ? Time 8   ? Period Weeks   ? Status On-going   ? Target Date 12/02/21   ?  ? SLP LONG TERM GOAL #5  ? Title pt will report a better outcome with communication than in the first week of therapy, on a PROM taken in one of the last 2 sessions   ? Time 8   ? Period Weeks   ? Status On-going   ? Target Date 12/02/21   ? ?  ?  ? ?  ? ? ? Plan - 11/02/21 0016   ? ? 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 SLP has not noted word finding difficulties to date, in conversation with SLP. SLP to inquire about pt's swallowing on his next visit. His weight has been stable. I recommend cont'd skilled ST to address dysarthria, and possibly cognitive-communication skills and dysphagia. SLP to monitor  diet tolerance; will request MBS orders and schedule objective testing if warranted.   ? Speech Therapy Frequency 2x / week   ? Duration 8 weeks   ? Treatment/Interventions Aspiration precaution training;Diet toleration management by SLP;Trials of upgraded texture/liquids;Cognitive  reorganization;Multimodal communcation approach;Compensatory strategies;Pharyngeal strengthening exercises;Language facilitation;Compensatory techniques;Cueing hierarchy;Internal/external aids;Functional tasks;SLP instruction and feedback;Patient/family education;Other (comment)   MBS  ? Potential to Achieve Goals Good   ? SLP Home Exercise Plan loud /a/   ? Consulted and Agree with Plan of Care Patient   ? ?  ?  ? ?  ? ? ?Patient will benefit from skilled therapeutic intervention in order to improve the following deficits and impairments:   ?Dysarthria and anarthria ? ?Cognitive communication deficit ? ?Aphasia ? ? ? ?Problem List ?Patient Active Problem List  ? Diagnosis Date Noted  ? Parkinson's disease ([place]) 01/11/2021  ? Sepsis without acute organ dysfunction (HCC)   ? Cellulitis 04/12/2020  ? Strain of right biceps 09/12/2017  ? Lymphedema 02/10/2017  ? BPH associated with nocturia 05/16/2016  ? Senile purpura ([place]) 05/03/2016  ? OSA (obstructive sleep apnea) 12/28/2015  ? Hypersomnia 11/15/2015  ? Cough 11/15/2015  ? 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 Dizziness 12/30/2014  ? Diastolic dysfunction 92/23/0097  ? Former smoker 04/29/2014  ? Chronic venous insufficiency 02/20/2014  ? Hyperglycemia 08/02/2012  ? Morbid obesity ([place]) 07/13/2010  ? History of malignant melanoma. left leg 03/09/2009  ? Insomnia 10/03/2007  ? Hyperlipidemia 12/13/2006  ? Essential hypertension 12/13/2006  ? ? ?[place], [place] ?11/02/2021, 12:19 AM ? ?[place] ?[facility] ?[address], [address] ?[place], [place], [address] ?Phone: [phone]   Fax:  [phone] ? ? ?Name: Nicholas [name] ?MRN: [id number] ?Date of Birth: 1946/01/28 ? ?

## 2021-11-03 ENCOUNTER — Ambulatory Visit: Payer: PPO

## 2021-11-03 DIAGNOSIS — R4701 Aphasia: Secondary | ICD-10-CM

## 2021-11-03 DIAGNOSIS — R471 Dysarthria and anarthria: Secondary | ICD-10-CM

## 2021-11-03 DIAGNOSIS — R1310 Dysphagia, unspecified: Secondary | ICD-10-CM

## 2021-11-03 NOTE — Therapy (Signed)
[place] ?[facility] ?[address], [address] ?[place], [place], [address] ?Phone: [phone]   Fax:  [phone] ? ?Speech Language Pathology Treatment ? ?Patient Details  ?Name: Nicholas [name] ?MRN: [id number] ?Date of Birth: 09-25-1945 ?Referring Provider (SLP): [name], Wells Guiles, DO ? ? ?Encounter Date: 11/03/2021 ? ? End of Session - 11/03/21 1552   ? ? Visit Number 7   ? Number of Visits 17   ? Date for SLP Re-Evaluation 12/02/21   ? SLP Start Time 1450   ? SLP Stop Time  1533   ? SLP Time Calculation (min) 43 min   ? Activity Tolerance Patient tolerated treatment well   ? ?  ?  ? ?  ? ? ?Past Medical History:  ?Diagnosis Date  ? Cancer [facility]) Jan 2008  ? 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 Hyperlipidemia   ? Hypertension   ? Lymphedema   ? ? ?Past Surgical History:  ?Procedure Laterality Date  ? 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 removal of melanoma of left foot/amputation of toes 1&25 Jul 2006  ? WRIST FRACTURE SURGERY    ? 76 years old-set  ? ? ?There were no vitals filed for this visit. ? ? Subjective Assessment - 11/03/21 1457   ? ? Subjective "last night was shitty - I didn't sleep at all. A lot of how I do the next day is due to how the previous night was."   ? Currently in Pain? Yes   ? Pain Score 4    ? Pain Location Arm   ? Pain Orientation Right   ? Pain Descriptors / Indicators Sore   ? Pain Type Acute pain   ? Pain Onset 1 to 4 weeks ago   ? Pain Frequency Intermittent   ? Aggravating Factors  movememt   ? Pain Relieving Factors keeping it still   ? Effect of Pain on Daily Activities more difficult to use the rt arm   ? ?  ?  ? ?  ? ? ? ? ? ? ? ? ADULT SLP TREATMENT - 11/03/21 1502   ? ?  ? General Information  ? Behavior/Cognition Alert;Cooperative;Pleasant mood   ?  ? Treatment Provided  ? Treatment provided Cognitive-Linquistic   ?  ? Cognitive-Linquistic Treatment  ? Treatment focused on Dysarthria   ? Skilled Treatment SLP used loud /a/ to  habitualize adequate breath support adn loudness and pt's average was initially low 90s but average settled in the mid-upper 80s dB with mod cues rarely for full breath. Everyday sentences were read with occasional min-mod cues for full breath and rare min-mod cues for loudness. In short sentence responses, he req'd mod A usually for full breath and min-mod occasionally for loudness. Nicholas [name] said he did not have as many frequent requests for repeats at poker last Tuesday. He demonstrated talking on residual volume x5 times during this task.SLP saw pt take full breath 2 time sduring session adn educated pt that was the type of breath he wanted in order to ensure loud speech. He agreed but was unable to achieve that volume of air again during session.   ?  ? Assessment / Recommendations / Plan  ? Plan Continue with current plan of care   ?  ? Progression Toward Goals  ? Progression toward goals Progressing toward goals   ? ?  ?  ? ?  ? ? ? SLP  Education - 11/03/21 1552   ? ? Education Details Full breath important for louder speech in his case   ? Person(s) Educated Patient   ? Methods Explanation   ? Comprehension Verbalized understanding   ? ?  ?  ? ?  ? ? ? SLP Short Term Goals - 11/03/21 1554   ? ?  ? SLP SHORT TERM GOAL #1  ? Title pt will sustain loud /a/ at >90 dB average at 30 cm over 3  sessions   ? Baseline 10-13-21   ? Status Partially Met   ?  ? SLP SHORT TERM GOAL #2  ? Title pt will exhibit volume of 70dB in sentence responses 90% with rare min A over 3 sessions   ? Time 4   ? Period Weeks   ? Status Not Met   ? Target Date 11/04/21   ?  ? SLP SHORT TERM GOAL #3  ? Title In 3 minutes simple conversation pt will achieve average speech volume of upper 60s-low 70s dB with occasional min cues over two sessions   ? Time 4   ? Period Weeks   ? Status Not Met   ? Target Date 11/04/21   ?  ? SLP SHORT TERM GOAL #4  ? Title pt will undergo instrumental swallow eval if clinically indicated   ? Time 4   ? Period Weeks    ? Status Deferred   ? Target Date 11/04/21   ?  ? SLP SHORT TERM GOAL #5  ? Title pt will use breath appropriate for 3 minutes simple conversation 90% of the time in 3 sessions   ? Time 4   ? Period Weeks   ? Status Not Met   ? Target Date 11/04/21   ? ?  ?  ? ?  ? ? ? SLP Long Term Goals - 11/03/21 1554   ? ?  ? SLP LONG TERM GOAL #1  ? Title pt will sustain loud /a/ at average >90dB at 30 cm over 4  sessions   ? Time 8   ? Period Weeks   or 17 total sessions - for all LTGs  ? Status Revised   ? Target Date 12/02/21   ?  ? SLP LONG TERM GOAL #2  ? Title pt will generate speech in 6 minutes simple to mod complex conversation with average upper 60s-low 70s dB average over three sessions   ? Time 8   ? Period Weeks   ? Status On-going   ? Target Date 12/02/21   ?  ? SLP LONG TERM GOAL #3  ? Title pt will use breath appropriate for 6 minutes simple-mod complex conversation 80% of the time in 3 sessions   ? Time 6   ? Period Weeks   ? Status On-going   ? Target Date 12/02/21   ?  ? SLP LONG TERM GOAL #4  ? Title pt wife will demo appropriate cues for pt to maximize loudness in 5 mintues conversation with her in 2 sessions   ? Time 8   ? Period Weeks   ? Status On-going   ? Target Date 12/02/21   ?  ? SLP LONG TERM GOAL #5  ? Title pt will report a better outcome with communication than in the first week of therapy, on a PROM taken in one of the last 2 sessions   ? Time 8   ? Period Weeks   ? Status On-going   ?  Target Date 12/02/21   ? ?  ?  ? ?  ? ? ? Plan - 11/03/21 1553   ? ? 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 SLP has not noted word finding difficulties to date, in conversation with SLP. SLP to inquire about pt's swallowing on his next visit. His weight has been stable. I recommend cont'd skilled ST to address  dysarthria, and possibly cognitive-communication skills and dysphagia. SLP to monitor diet tolerance; will request MBS orders and schedule objective testing if warranted.   ? Speech Therapy Frequency 2x / week   ? Duration 8 weeks   ? Treatment/Interventions Aspiration precaution training;Diet toleration management by SLP;Trials of upgraded texture/liquids;Cognitive reorganization;Multimodal communcation approach;Compensatory strategies;Pharyngeal strengthening exercises;Language facilitation;Compensatory techniques;Cueing hierarchy;Internal/external aids;Functional tasks;SLP instruction and feedback;Patient/family education;Other (comment)   MBS  ? Potential to Achieve Goals Good   ? SLP Home Exercise Plan loud /a/   ? Consulted and Agree with Plan of Care Patient   ? ?  ?  ? ?  ? ? ?Patient will benefit from skilled therapeutic intervention in order to improve the following deficits and impairments:   ?Dysarthria and anarthria ? ?Aphasia ? ?Dysphagia, unspecified type ? ? ? ?Problem List ?Patient Active Problem List  ? Diagnosis Date Noted  ? Parkinson's disease ([place]) 01/11/2021  ? Sepsis without acute organ dysfunction (HCC)   ? Cellulitis 04/12/2020  ? Strain of right biceps 09/12/2017  ? Lymphedema 02/10/2017  ? BPH associated with nocturia 05/16/2016  ? Senile purpura ([place]) 05/03/2016  ? OSA (obstructive sleep apnea) 12/28/2015  ? Hypersomnia 11/15/2015  ? Cough 11/15/2015  ? 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 Dizziness 12/30/2014  ? Diastolic dysfunction 16/04/9603  ? Former smoker 04/29/2014  ? Chronic venous insufficiency 02/20/2014  ? Hyperglycemia 08/02/2012  ? Morbid obesity ([place]) 07/13/2010  ? History of malignant melanoma. left leg 03/09/2009  ? Insomnia 10/03/2007  ? Hyperlipidemia 12/13/2006  ? Essential hypertension 12/13/2006  ? ? ?[place], [place] ?11/03/2021, 3:55 PM ? ?[place] ?[facility] ?[address], [address] ?[place], [place], [address] ?Phone:  [phone]   Fax:  [phone] ? ? ?Name: Nicholas [name] ?MRN: [id number] ?Date of Birth: 28-Sep-1945 ? ?

## 2021-11-08 ENCOUNTER — Ambulatory Visit: Payer: PPO

## 2021-11-08 DIAGNOSIS — R471 Dysarthria and anarthria: Secondary | ICD-10-CM | POA: Diagnosis not present

## 2021-11-08 DIAGNOSIS — R4701 Aphasia: Secondary | ICD-10-CM

## 2021-11-08 DIAGNOSIS — R1310 Dysphagia, unspecified: Secondary | ICD-10-CM

## 2021-11-08 NOTE — Therapy (Signed)
[place] ?[facility] ?[address], [address] ?[place], [place], [address] ?Phone: [phone]   Fax:  [phone] ? ?Speech Language Pathology Treatment ? ?Patient Details  ?Name: Nicholas [name] ?MRN: [id number] ?Date of Birth: 02-27-1946 ?Referring Provider (SLP): [name], Wells Guiles, DO ? ? ?Encounter Date: 11/08/2021 ? ? End of Session - 11/08/21 1653   ? ? Visit Number 8   ? Number of Visits 17   ? Date for SLP Re-Evaluation 12/02/21   ? SLP Start Time 1450   ? SLP Stop Time  1530   ? SLP Time Calculation (min) 40 min   ? Activity Tolerance Patient tolerated treatment well   ? ?  ?  ? ?  ? ? ?Past Medical History:  ?Diagnosis Date  ? Cancer [facility]) Jan 2008  ? 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 Hyperlipidemia   ? Hypertension   ? Lymphedema   ? ? ?Past Surgical History:  ?Procedure Laterality Date  ? 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 removal of melanoma of left foot/amputation of toes 1&25 Jul 2006  ? WRIST FRACTURE SURGERY    ? 76 years old-set  ? ? ?There were no vitals filed for this visit. ? ? Subjective Assessment - 11/08/21 1647   ? ? Subjective "It's not working wiht [name] to come in front of me - she won't do it."   ? Currently in Pain? Yes   ? Pain Score 3    ? Pain Location Arm   ? Pain Orientation Right   ? Pain Descriptors / Indicators Sore   ? Pain Type Acute pain   ? ?  ?  ? ?  ? ? ? ? ? ? ? ? ADULT SLP TREATMENT - 11/08/21 1647   ? ?  ? General Information  ? Behavior/Cognition Alert;Cooperative;Pleasant mood   ?  ? Treatment Provided  ? Treatment provided Cognitive-Linquistic   ?  ? Cognitive-Linquistic Treatment  ? Treatment focused on Dysarthria   ? Skilled Treatment REgarding pt's "s" statement - talked a bit with him about explaining to wife that this is the most efficient way they have to communicate as he will be softer at this time, adn that to communicate that he and SLP are working on decreasing frequency of communication challenges  between him and wife. Pt's loud /a/ used today to habitualize louder speech volume and full breath - pt average in low 90s dB, rare min A for breath. Everyday sentences produced in low to mid 80s dB, with occasional mod cues for breath. For practice for adequate breath support, pt provided phrase responses spontaneously with rare min A increased to usual min-mod A the last 1/3 of pt responses. Pt endorsed fatigue played a role in his decr'd performance. Pt took water with meds today wihtout any overt s/s oral or pharyngeal deficits.   ?  ? Assessment / Recommendations / Plan  ? Plan Continue with current plan of care   ?  ? Progression Toward Goals  ? Progression toward goals Progressing toward goals   ? ?  ?  ? ?  ? ? ? ? ? SLP Short Term Goals - 11/03/21 1554   ? ?  ? SLP SHORT TERM GOAL #1  ? Title pt will sustain loud /a/ at >90 dB average at 30 cm over 3  sessions   ? Baseline 10-13-21   ? Status Partially Met   ?  ?  SLP SHORT TERM GOAL #2  ? Title pt will exhibit volume of 70dB in sentence responses 90% with rare min A over 3 sessions   ? Time 4   ? Period Weeks   ? Status Not Met   ? Target Date 11/04/21   ?  ? SLP SHORT TERM GOAL #3  ? Title In 3 minutes simple conversation pt will achieve average speech volume of upper 60s-low 70s dB with occasional min cues over two sessions   ? Time 4   ? Period Weeks   ? Status Not Met   ? Target Date 11/04/21   ?  ? SLP SHORT TERM GOAL #4  ? Title pt will undergo instrumental swallow eval if clinically indicated   ? Time 4   ? Period Weeks   ? Status Deferred   ? Target Date 11/04/21   ?  ? SLP SHORT TERM GOAL #5  ? Title pt will use breath appropriate for 3 minutes simple conversation 90% of the time in 3 sessions   ? Time 4   ? Period Weeks   ? Status Not Met   ? Target Date 11/04/21   ? ?  ?  ? ?  ? ? ? SLP Long Term Goals - 11/08/21 1656   ? ?  ? SLP LONG TERM GOAL #1  ? Title pt will sustain loud /a/ at average >90dB at 30 cm over 4  sessions   ? Time 8   ? Period  Weeks   or 17 total sessions - for all LTGs  ? Status On-going   ? Target Date 12/02/21   ?  ? SLP LONG TERM GOAL #2  ? Title pt will generate speech in 6 minutes simple to mod complex conversation with average upper 60s-low 70s dB average over three sessions   ? Time 8   ? Period Weeks   ? Status On-going   ? Target Date 12/02/21   ?  ? SLP LONG TERM GOAL #3  ? Title pt will use breath appropriate for 6 minutes simple-mod complex conversation 80% of the time in 3 sessions   ? Time 6   ? Period Weeks   ? Status On-going   ? Target Date 12/02/21   ?  ? SLP LONG TERM GOAL #4  ? Title pt wife will demo appropriate cues for pt to maximize loudness in 5 mintues conversation with her in 2 sessions   ? Time 8   ? Period Weeks   ? Status On-going   ? Target Date 12/02/21   ?  ? SLP LONG TERM GOAL #5  ? Title pt will report a better outcome with communication than in the first week of therapy, on a PROM taken in one of the last 2 sessions   ? Time 8   ? Period Weeks   ? Status On-going   ? Target Date 12/02/21   ? ?  ?  ? ?  ? ? ? Plan - 11/08/21 1653   ? ? 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 SLP has not noted word finding difficulties to date, in conversation with SLP.  His weight has been stable; pt observed to take water with meds (two drinks) without overt s/sx oral or pharyngeal deficits. I recommend cont'd skilled ST to address dysarthria, and possibly cognitive-communication skills  and dysphagia. SLP to monitor diet tolerance; will request MBS orders and schedule objective testing if warranted.   ? Speech Therapy Frequency 2x / week   ? Duration 8 weeks   ? Treatment/Interventions Aspiration precaution training;Diet toleration management by SLP;Trials of upgraded texture/liquids;Cognitive reorganization;Multimodal  communcation approach;Compensatory strategies;Pharyngeal strengthening exercises;Language facilitation;Compensatory techniques;Cueing hierarchy;Internal/external aids;Functional tasks;SLP instruction and feedback;Patient/family education;Other (comment)   MBS  ? Potential to Achieve Goals Good   ? SLP Home Exercise Plan loud /a/   ? Consulted and Agree with Plan of Care Patient   ? ?  ?  ? ?  ? ? ?Patient will benefit from skilled therapeutic intervention in order to improve the following deficits and impairments:   ?Dysarthria and anarthria ? ?Aphasia ? ?Dysphagia, unspecified type ? ? ? ?Problem List ?Patient Active Problem List  ? Diagnosis Date Noted  ? Parkinson's disease ([place]) 01/11/2021  ? Sepsis without acute organ dysfunction (HCC)   ? Cellulitis 04/12/2020  ? Strain of right biceps 09/12/2017  ? Lymphedema 02/10/2017  ? BPH associated with nocturia 05/16/2016  ? Senile purpura ([place]) 05/03/2016  ? OSA (obstructive sleep apnea) 12/28/2015  ? Hypersomnia 11/15/2015  ? Cough 11/15/2015  ? 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 Dizziness 12/30/2014  ? Diastolic dysfunction 52/01/4096  ? Former smoker 04/29/2014  ? Chronic venous insufficiency 02/20/2014  ? Hyperglycemia 08/02/2012  ? Morbid obesity ([place]) 07/13/2010  ? History of malignant melanoma. left leg 03/09/2009  ? Insomnia 10/03/2007  ? Hyperlipidemia 12/13/2006  ? Essential hypertension 12/13/2006  ? ? ?[place], [place] ?11/08/2021, 5:09 PM ? ?[place] ?[facility] ?[address], [address] ?[place], [place], [address] ?Phone: [phone]   Fax:  [phone] ? ? ?Name: Nicholas [name] ?MRN: [id number] ?Date of Birth: [birth year] ? ?

## 2021-11-10 ENCOUNTER — Ambulatory Visit: Payer: PPO

## 2021-11-10 DIAGNOSIS — R471 Dysarthria and anarthria: Secondary | ICD-10-CM

## 2021-11-10 NOTE — Therapy (Signed)
[place] ?[facility] ?[address], [address] ?[place], [place], [address] ?Phone: [phone]   Fax:  [phone] ? ?Speech Language Pathology Treatment ? ?Patient Details  ?Name: Nicholas [name] ?MRN: [id number] ?Date of Birth: 1945/08/26 ?Referring Provider (SLP): [name], Wells Guiles, DO ? ? ?Encounter Date: 11/10/2021 ? ? End of Session - 11/10/21 1559   ? ? Visit Number 9   ? Number of Visits 17   ? Date for SLP Re-Evaluation 12/02/21   ? SLP Start Time 1446   ? SLP Stop Time  1530   ? SLP Time Calculation (min) 44 min   ? Activity Tolerance Patient tolerated treatment well   ? ?  ?  ? ?  ? ? ?Past Medical History:  ?Diagnosis Date  ? Cancer [facility]) Jan 2008  ? 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 Hyperlipidemia   ? Hypertension   ? Lymphedema   ? ? ?Past Surgical History:  ?Procedure Laterality Date  ? 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 removal of melanoma of left foot/amputation of toes 1&25 Jul 2006  ? WRIST FRACTURE SURGERY    ? 76 years old-set  ? ? ?There were no vitals filed for this visit. ? ? Subjective Assessment - 11/10/21 1451   ? ? Subjective "(Poker) went pretty well."   ? Currently in Pain? Yes   ? Pain Score 4    ? Pain Location Arm   ? Pain Orientation Right   ? Pain Descriptors / Indicators Dull   ? Pain Type Acute pain   ? ?  ?  ? ?  ? ? ? ? ? ? ? ? ADULT SLP TREATMENT - 11/10/21 1452   ? ?  ? General Information  ? Behavior/Cognition Alert;Cooperative;Pleasant mood   ?  ? Treatment Provided  ? Treatment provided Cognitive-Linquistic   ?  ? Cognitive-Linquistic Treatment  ? Treatment focused on Dysarthria   ? Skilled Treatment Pt reports today that he feels he is taking breath prior to speaking more often now than prior to ST. One example of this was poker Tuesday night - where pt took deeper breaths more frequently and pt did not need to repeat as frequently due to louder speech. Still somewhat challenging to communicate with Nicholas [name], however, when  they are not facing each other. Nicholas [name] relates that Nicholas [name] is not always willing to come to him face him and thus make for more efficient communication. Pt spoke on residual air only once in 10 minutes and another 10 minute conversation.   ?  ? Assessment / Recommendations / Plan  ? Plan Continue with current plan of care   ?  ? Progression Toward Goals  ? Progression toward goals Progressing toward goals   ? ?  ?  ? ?  ? ? ? ? ? SLP Short Term Goals - 11/10/21 1600   ? ?  ? SLP SHORT TERM GOAL #1  ? Title pt will sustain loud /a/ at >90 dB average at 30 cm over 3  sessions   ? Baseline 10-13-21   ? Status Partially Met   ?  ? SLP SHORT TERM GOAL #2  ? Title pt will exhibit volume of 70dB in sentence responses 90% with rare min A over 3 sessions   ? Time 4   ? Period Weeks   ? Status Not Met   ? Target Date 11/04/21   ?  ?  SLP SHORT TERM GOAL #3  ? Title In 3 minutes simple conversation pt will achieve average speech volume of upper 60s-low 70s dB with occasional min cues over two sessions   ? Time 4   ? Period Weeks   ? Status Not Met   ? Target Date 11/04/21   ?  ? SLP SHORT TERM GOAL #4  ? Title pt will undergo instrumental swallow eval if clinically indicated   ? Time 4   ? Period Weeks   ? Status Deferred   ? Target Date 11/04/21   ?  ? SLP SHORT TERM GOAL #5  ? Title pt will use breath appropriate for 3 minutes simple conversation 90% of the time in 3 sessions   ? Time 4   ? Period Weeks   ? Status Not Met   ? Target Date 11/04/21   ? ?  ?  ? ?  ? ? ? SLP Long Term Goals - 11/10/21 1601   ? ?  ? SLP LONG TERM GOAL #1  ? Title pt will sustain loud /a/ at average >90dB at 30 cm over 4  sessions   ? Time 8   ? Period Weeks   or 17 total sessions - for all LTGs  ? Status On-going   ? Target Date 12/02/21   ?  ? SLP LONG TERM GOAL #2  ? Title pt will generate speech in 6 minutes simple to mod complex conversation with average upper 60s-low 70s dB average over three sessions   ? Time 8   ? Period Weeks   ? Status  On-going   ? Target Date 12/02/21   ?  ? SLP LONG TERM GOAL #3  ? Title pt will use breath appropriate for 6 minutes simple-mod complex conversation 80% of the time in 3 sessions   ? Baseline 11-10-21   ? Time 6   ? Period Weeks   ? Status On-going   ? Target Date 12/02/21   ?  ? SLP LONG TERM GOAL #4  ? Title pt wife will demo appropriate cues for pt to maximize loudness in 5 mintues conversation with her in 2 sessions   ? Time 8   ? Period Weeks   ? Status On-going   ? Target Date 12/02/21   ?  ? SLP LONG TERM GOAL #5  ? Title pt will report a better outcome with communication than in the first week of therapy, on a PROM taken in one of the last 2 sessions   ? Time 8   ? Period Weeks   ? Status On-going   ? Target Date 12/02/21   ? ?  ?  ? ?  ? ? ? Plan - 11/10/21 1559   ? ? 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 SLP has not noted word finding difficulties to date, in conversation with SLP.  Today he told SLP he notes he is more frequently using improved breath support/more respiratory volume for speech which is resulting in more frequent louder speech - example was poker Tuesday night. I recommend cont'd skilled ST to address dysarthria, and possibly cognitive-communication skills and dysphagia. SLP to monitor diet tolerance; will request MBS orders and schedule objective testing if warranted.   ? Speech Therapy Frequency 2x / week   ? Duration 8 weeks   ? Treatment/Interventions  Aspiration precaution training;Diet toleration management by SLP;Trials of upgraded texture/liquids;Cognitive reorganization;Multimodal communcation approach;Compensatory strategies;Pharyngeal strengthening exercises;Language facilitation;Compensatory techniques;Cueing hierarchy;Internal/external aids;Functional tasks;SLP instruction and  feedback;Patient/family education;Other (comment)   MBS  ? Potential to Achieve Goals Good   ? SLP Home Exercise Plan loud /a/   ? Consulted and Agree with Plan of Care Patient   ? ?  ?  ? ?  ? ? ?Patient will benefit from skilled therapeutic intervention in order to improve the following deficits and impairments:   ?Dysarthria and anarthria ? ? ? ?Problem List ?Patient Active Problem List  ? Diagnosis Date Noted  ? Parkinson's disease ([place]) 01/11/2021  ? Sepsis without acute organ dysfunction (HCC)   ? Cellulitis 04/12/2020  ? Strain of right biceps 09/12/2017  ? Lymphedema 02/10/2017  ? BPH associated with nocturia 05/16/2016  ? Senile purpura ([place]) 05/03/2016  ? OSA (obstructive sleep apnea) 12/28/2015  ? Hypersomnia 11/15/2015  ? Cough 11/15/2015  ? 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 Dizziness 12/30/2014  ? Diastolic dysfunction 16/04/9603  ? Former smoker 04/29/2014  ? Chronic venous insufficiency 02/20/2014  ? Hyperglycemia 08/02/2012  ? Morbid obesity ([place]) 07/13/2010  ? History of malignant melanoma. left leg 03/09/2009  ? Insomnia 10/03/2007  ? Hyperlipidemia 12/13/2006  ? Essential hypertension 12/13/2006  ? ? ?[place], [place] ?11/10/2021, 4:02 PM ? ?[place] ?[facility] ?[address], [address] ?[place], [place], [address] ?Phone: [phone]   Fax:  [phone] ? ? ?Name: Nicholas [name] ?MRN: [id number] ?Date of Birth: Jun 09, 1946 ? ?

## 2021-11-11 ENCOUNTER — Other Ambulatory Visit: Payer: PPO | Admitting: Hospice

## 2021-11-11 DIAGNOSIS — Z515 Encounter for palliative care: Secondary | ICD-10-CM | POA: Diagnosis not present

## 2021-11-11 DIAGNOSIS — G20 Parkinson's disease: Secondary | ICD-10-CM | POA: Diagnosis not present

## 2021-11-11 DIAGNOSIS — R471 Dysarthria and anarthria: Secondary | ICD-10-CM

## 2021-11-11 DIAGNOSIS — I890 Lymphedema, not elsewhere classified: Secondary | ICD-10-CM

## 2021-11-11 DIAGNOSIS — G20A1 Parkinson's disease without dyskinesia, without mention of fluctuations: Secondary | ICD-10-CM

## 2021-11-11 NOTE — Progress Notes (Signed)
? ? ?[facility] ?Community Palliative Care Consult Note ?Telephone: [phone]  ?Fax: [phone] ? ?PATIENT NAME: Nicholas [name] ?[address] ?[place] [address] ?[phone] (home)  ?DOB: 02-Aug-1945 ?MRN: [id number] ? ?PRIMARY CARE PROVIDER:    ?Nicholas Olp, MD,  ?[place] [address] ?[phone] ? ?REFERRING PROVIDER:   ?Nicholas Olp, MD ?[address] ?[place],  [place] [address] ?[phone] ? ?RESPONSIBLE PARTY:   Self/Spouse ?[place] - best number to call ?Nicholas [name] - caregiver ?[place] ?Contact Information   ? ? Name Relation Home Work Mobile  ? [place] Spouse [phone]  [phone]  ? ?  ? ? ?I met face to face with patient and family at home. Palliative Care was asked to follow this patient by consultation request of  Nicholas Olp, MD to address advance care planning, complex medical decision making and goals of care clarification. Nicholas [name] is at home with patient during visit. Caregiver - Nicholas [name] are home with patient during visit. This is a follow up visit. ? ?  ASSESSMENT AND / RECOMMENDATIONS:  ? ?CODE STATUS:Patient is a Do Not Resuscitate.  ? ?Goals of Care: Goals include to maximize quality of life and symptom management. ? ?Symptom Management/Plan: ?Dysarthria: hypophonic related to worsening Parkinson's disease. SLP following. Goes twice a week at [name] clinic.  ?Parkinson Disease: with hypokinetic rigid syndrome; currently no ambulatory. Last visit with Neurologist 07/26/21. Next follow in June '23. No changes to medications. Continue Sinemet and scheduled appointments with Neurologist. Recently completed physical activity.  Pt uses stand lift to help with standing and transitioning to wheelchair. Plan is to get patient fitted a special glove to help with stiffness and neuropathy in his fingers.  Fall precautions discussed. ?Lymphedema: related to Melanoma on his left feet;  improving. Continue with Ray - Lymphatic Specialist with [facility]. Elevate BLE.  ?Follow up: Palliative care will continue to follow for complex medical decision making, advance care planning, and clarification of goals. Return 6 weeks or prn.Encouraged to call provider sooner with any concerns.  ? ?Family /Caregiver/Community Supports: Patient lives at home with spouse. He has caregivers that come to help patient in ADLs.  ? ?HOSPICE ELIGIBILITY/DIAGNOSIS: TBD ? ?Chief Complaint: Follow up visit ? ?HISTORY OF PRESENT ILLNESS:  Nicholas [name] is a 76 y.o. year old male  with multiple medical conditions including Parkinson disease which started Dec 2017, worsening with hypokinetic rigid syndrome making it difficult to move/walk around as before, impairing his independence; followed by SLP for dyarthria with hypophonic voice. Patient denies pain/discomfort, in no acute distress; no hospitalization since last visit. History of Melanoma, Lymphedema.  ?History obtained from review of EMR, discussion with primary team, caregiver, family and/or Nicholas [name].  ?Review and summarization of Epic records shows history from other than patient. Rest of 10 point ROS asked and negative.  ?I reviewed, as needed, available labs, patient records, imaging, studies and related documents from the EMR. ? ?ROS ?General: NAD ?EYES: denies vision changes ?ENMT: denies dysphagia ?Cardiovascular: denies chest pain/discomfort ?Pulmonary: denies cough, denies SOB ?Abdomen: endorses good appetite, denies constipation/diarrhea ?GU: denies dysuria, urinary frequency ?MSK:  endorses weakness,  no falls reported, ?Skin: denies rashes or wounds, endorses lymphedema in BLE ?Neurological: denies pain, denies insomnia ?Psych: Endorses positive mood ?Heme/lymph/immuno: denies bruises, abnormal bleeding ? ?Physical Exam: ? ?Constitutional: NAD ?General: Well groomed, cooperative ?EYES: anicteric sclera, lids intact, no discharge  ?ENMT: Moist mucous  membrane ?CV: S1 S2, RRR ?Pulmonary: LCTA, no increased work of breathing, no cough, ?Abdomen: active BS + 4 quadrants, soft and non tender ?GU: no suprapubic tenderness ?MSK: weakness, limited ROM, lymphedema in BL extremities improving, Velcro wrap in place.  ?Skin: warm and dry, no rashes or wounds on visible skin ?Neuro:  weakness, otherwise non focal, oriented x 3 ?Psych: non-anxious affect ?Hem/lymph/immuno: no widespread bruising ? ? ?PAST MEDICAL HISTORY:  ?Active Ambulatory Problems  ?  Diagnosis Date Noted  ? History of malignant melanoma. left leg 03/09/2009  ? Hyperlipidemia 12/13/2006  ? Essential hypertension 12/13/2006  ? Insomnia 10/03/2007  ? Morbid obesity ([employer]) 07/13/2010  ? Hyperglycemia 08/02/2012  ? Chronic venous insufficiency 02/20/2014  ? Former smoker 04/29/2014  ? Diastolic dysfunction 37/16/9678  ? Dizziness 12/30/2014  ? 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 Hypersomnia 11/15/2015  ? Cough 11/15/2015  ? OSA (obstructive sleep apnea) 12/28/2015  ? Senile purpura ([place]) 05/03/2016  ? BPH associated with nocturia 05/16/2016  ? Lymphedema 02/10/2017  ? Strain of right biceps 09/12/2017  ? Cellulitis 04/12/2020  ? Sepsis without acute organ dysfunction (HCC)   ? Parkinson's disease ([place]) 01/11/2021  ? ?Resolved Ambulatory Problems  ?  Diagnosis Date Noted  ? Chronic venous insufficiency 11/20/2014  ? ?Past Medical History:  ?Diagnosis Date  ? Cancer [facility]) Jan 2008  ? Hypertension   ? ? ?SOCIAL HX:  ?Social History  ? ?Tobacco Use  ? Smoking status: Former  ?  Packs/day: 1.00  ?  Years: 16.00  ?  Pack years: 16.00  ?  Types: Cigarettes  ?  Quit date: 12/22/1993  ?  Years since quitting: 27.9  ? Smokeless tobacco: Never  ?Substance Use Topics  ? Alcohol use: No  ?  Alcohol/week: 21.0 standard drinks  ?  Types: 21 Standard drinks or equivalent per week  ? ?  ?FAMILY HX:  ?Family History  ?Problem Relation Age of Onset  ? Hypertension Father   ? CVA Father   ?     age 3  ? Hyperlipidemia  Father   ? Other Mother   ?     Deceased  ? Healthy Sister   ? Healthy Brother   ?   ? ?ALLERGIES:  ?Allergies  ?Allergen Reactions  ? Amoxicillin Swelling  ?  Presumed angioedema of lips due to prednisone  ?   ? ?PERTINENT MEDICATIONS:  ?Outpatient Encounter Medications as of 11/11/2021  ?Medication Sig  ? albuterol (VENTOLIN HFA) 108 (90 Base) MCG/ACT inhaler Inhale 2 puffs into the lungs every 6 (six) hours as needed for wheezing or shortness of breath.  ? carbidopa-levodopa (SINEMET IR) 25-100 MG tablet TAKE 2 TABLETS AT 7AM,11AM, 3PM, AND AT 7PM.  ? [facility]) OINT Apply topically.  ? rosuvastatin (CRESTOR) 10 MG tablet TAKE ONE TABLET BY MOUTH DAILY  ? ?No facility-administered encounter medications on file as of 11/11/2021.  ?I spent 60 minutes providing this consultation; time includes spent with patient/family, chart review and documentation. More than 50% of the time in this consultation was spent on care coordination ?Thank you for the opportunity to participate in the care of Mr. Clemon.  The palliative care team will continue to follow. Please call our office at [phone] if we can be of additional assistance.  ? ?Note: Portions of this note were generated with [employer]. Dictation errors may occur despite best attempts at proofreading. ? ?Teodoro Spray, NP  ? ?  ?

## 2021-11-15 ENCOUNTER — Ambulatory Visit: Payer: PPO

## 2021-11-15 DIAGNOSIS — R41841 Cognitive communication deficit: Secondary | ICD-10-CM

## 2021-11-15 DIAGNOSIS — R471 Dysarthria and anarthria: Secondary | ICD-10-CM | POA: Diagnosis not present

## 2021-11-15 DIAGNOSIS — R4701 Aphasia: Secondary | ICD-10-CM

## 2021-11-15 NOTE — Therapy (Signed)
[place] ?[facility] ?[address], [address] ?[place], [place], [address] ?Phone: [phone]   Fax:  [phone] ? ?Speech Language Pathology Treatment ? ?Patient Details  ?Name: Nicholas [name] ?MRN: [id number] ?Date of Birth: [birth year] ?Referring Provider (SLP): [name], Wells Guiles, DO ? ? ?Encounter Date: 11/15/2021 ? ? End of Session - 11/15/21 1535   ? ? Visit Number 10   ? Number of Visits 17   ? Date for SLP Re-Evaluation 12/02/21   ? SLP Start Time 1447   ? SLP Stop Time  1530   ? SLP Time Calculation (min) 43 min   ? Activity Tolerance Patient tolerated treatment well   ? ?  ?  ? ?  ? ? ?Past Medical History:  ?Diagnosis Date  ? Cancer [facility]) Jan 2008  ? 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 Hyperlipidemia   ? Hypertension   ? Lymphedema   ? ? ?Past Surgical History:  ?Procedure Laterality Date  ? 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 removal of melanoma of left foot/amputation of toes 1&25 Jul 2006  ? WRIST FRACTURE SURGERY    ? 76 years old-set  ? ? ?There were no vitals filed for this visit. ? ? Subjective Assessment - 11/15/21 1509   ? ? Subjective Pt reports poor sleep last 1-2 nights. "(aphonic) First and third." AFter SLP cue for full breath "First and Third" with upper 60s dB volume.   ? Currently in Pain? Yes   ? Pain Score 6    ? Pain Location Arm   ? Pain Orientation Right   ? Pain Descriptors / Indicators Sore   ? Pain Type Acute pain   ? Pain Onset In the past 7 days   ? ?  ?  ? ?  ? ? ? ? ? ? ? ? ADULT SLP TREATMENT - 11/15/21 1511   ? ?  ? General Information  ? Behavior/Cognition Alert;Cooperative;Pleasant mood   ?  ? Treatment Provided  ? Treatment provided Cognitive-Linquistic   ?  ? Cognitive-Linquistic Treatment  ? Treatment focused on Dysarthria   ? Skilled Treatment Pt denies overt s/sx of oral and pharyngeal dysphagia. Pt upon entering ST room, demonstrated taking more -WNL size breath prior to speaking than previous sessions, prior to  session beginning with loud /a/. Loud /a/ targeted today to habitualize full breath prior to speaking and using louder speech in conversation - pt averaged in upper 80s-low 90s dB. WIth sentences pt averaged low-mid 80s dB. SLP needed to cue pt for louder speech and full breath (min-mod cues, occasionally). In sentence responses, usual mod cues necessary for full breath and for volume to incr to upper 60s dB. By end of session no carryover seen to spontaneous conversation, likely due to pt fatigue.   ?  ? Assessment / Recommendations / Plan  ? Plan Continue with current plan of care   likely d/c next session  ?  ? Progression Toward Goals  ? Progression toward goals Progressing toward goals   fatigued easily today  ? ?  ?  ? ?  ? ? ? ? ? SLP Short Term Goals - 11/15/21 1536   ? ?  ? SLP SHORT TERM GOAL #1  ? Title pt will sustain loud /a/ at >90 dB average at 30 cm over 3  sessions   ? Baseline 10-13-21   ? Status Partially Met   ?  ?  SLP SHORT TERM GOAL #2  ? Title pt will exhibit volume of 70dB in sentence responses 90% with rare min A over 3 sessions   ? Time 4   ? Period Weeks   ? Status Not Met   ? Target Date 11/04/21   ?  ? SLP SHORT TERM GOAL #3  ? Title In 3 minutes simple conversation pt will achieve average speech volume of upper 60s-low 70s dB with occasional min cues over two sessions   ? Time 4   ? Period Weeks   ? Status Not Met   ? Target Date 11/04/21   ?  ? SLP SHORT TERM GOAL #4  ? Title pt will undergo instrumental swallow eval if clinically indicated   ? Time 4   ? Period Weeks   ? Status Deferred   ? Target Date 11/04/21   ?  ? SLP SHORT TERM GOAL #5  ? Title pt will use breath appropriate for 3 minutes simple conversation 90% of the time in 3 sessions   ? Time 4   ? Period Weeks   ? Status Not Met   ? Target Date 11/04/21   ? ?  ?  ? ?  ? ? ? SLP Long Term Goals - 11/15/21 1536   ? ?  ? SLP LONG TERM GOAL #1  ? Title pt will sustain loud /a/ at average >90dB at 30 cm over 4  sessions   ? Time 8    ? Period Weeks   or 17 total sessions - for all LTGs  ? Status On-going   ? Target Date 12/02/21   ?  ? SLP LONG TERM GOAL #2  ? Title pt will generate speech in 6 minutes simple to mod complex conversation with average upper 60s-low 70s dB average over three sessions   ? Time 8   ? Period Weeks   ? Status On-going   ? Target Date 12/02/21   ?  ? SLP LONG TERM GOAL #3  ? Title pt will use breath appropriate for 6 minutes simple-mod complex conversation 80% of the time in 3 sessions   ? Baseline 11-10-21   ? Time 6   ? Period Weeks   ? Status On-going   ? Target Date 12/02/21   ?  ? SLP LONG TERM GOAL #4  ? Title pt wife will demo appropriate cues for pt to maximize loudness in 5 mintues conversation with her in 2 sessions   ? Time 8   ? Period Weeks   ? Status On-going   ? Target Date 12/02/21   ?  ? SLP LONG TERM GOAL #5  ? Title pt will report a better outcome with communication than in the first week of therapy, on a PROM taken in one of the last 2 sessions   ? Time 8   ? Period Weeks   ? Status On-going   ? Target Date 12/02/21   ? ?  ?  ? ?  ? ? ? Plan - 11/15/21 1535   ? ? 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 SLP has not noted word finding difficulties to date, in conversation with SLP.  Today he told SLP he notes he is more frequently using improved breath support/more respiratory volume for speech which is resulting in more frequent louder speech - example  was poker Tuesday night. I recommend cont'd skilled ST to address dysarthria, and possibly cognitive-communication skills and dysphagia. SLP to monitor diet tolerance; will request MBS orders and schedule objective testing if warranted.   ? Speech Therapy Frequency 2x / week   ? Duration 8 weeks   ? Treatment/Interventions Aspiration precaution training;Diet  toleration management by SLP;Trials of upgraded texture/liquids;Cognitive reorganization;Multimodal communcation approach;Compensatory strategies;Pharyngeal strengthening exercises;Language facilitation;Compensatory techniques;Cueing hierarchy;Internal/external aids;Functional tasks;SLP instruction and feedback;Patient/family education;Other (comment)   MBS  ? Potential to Achieve Goals Good   ? SLP Home Exercise Plan loud /a/   ? Consulted and Agree with Plan of Care Patient   ? ?  ?  ? ?  ? ? ?Patient will benefit from skilled therapeutic intervention in order to improve the following deficits and impairments:   ?Dysarthria and anarthria ? ?Aphasia ? ?Cognitive communication deficit ? ? ? ?Problem List ?Patient Active Problem List  ? Diagnosis Date Noted  ? Parkinson's disease ([place]) 01/11/2021  ? Sepsis without acute organ dysfunction (HCC)   ? Cellulitis 04/12/2020  ? Strain of right biceps 09/12/2017  ? Lymphedema 02/10/2017  ? BPH associated with nocturia 05/16/2016  ? Senile purpura ([place]) 05/03/2016  ? OSA (obstructive sleep apnea) 12/28/2015  ? Hypersomnia 11/15/2015  ? Cough 11/15/2015  ? 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 Dizziness 12/30/2014  ? Diastolic dysfunction 15/37/9432  ? Former smoker 04/29/2014  ? Chronic venous insufficiency 02/20/2014  ? Hyperglycemia 08/02/2012  ? Morbid obesity ([place]) 07/13/2010  ? History of malignant melanoma. left leg 03/09/2009  ? Insomnia 10/03/2007  ? Hyperlipidemia 12/13/2006  ? Essential hypertension 12/13/2006  ? ? ?[place], [place] ?11/15/2021, 3:38 PM ? ?[place] ?[facility] ?[address], [address] ?[place], [place], [address] ?Phone: [phone]   Fax:  [phone] ? ? ?Name: Nicholas [name] ?MRN: [id number] ?Date of Birth: 1946/03/21 ? ?

## 2021-11-17 ENCOUNTER — Ambulatory Visit: Payer: PPO

## 2021-11-22 ENCOUNTER — Ambulatory Visit (INDEPENDENT_AMBULATORY_CARE_PROVIDER_SITE_OTHER): Payer: PPO | Admitting: Family Medicine

## 2021-11-22 ENCOUNTER — Encounter: Payer: Self-pay | Admitting: Family Medicine

## 2021-11-22 VITALS — BP 130/70 | HR 78 | Temp 98.0°F | Ht 67.0 in | Wt 220.0 lb

## 2021-11-22 DIAGNOSIS — G20C Parkinsonism, unspecified: Secondary | ICD-10-CM

## 2021-11-22 DIAGNOSIS — I890 Lymphedema, not elsewhere classified: Secondary | ICD-10-CM

## 2021-11-22 DIAGNOSIS — R202 Paresthesia of skin: Secondary | ICD-10-CM

## 2021-11-22 DIAGNOSIS — R739 Hyperglycemia, unspecified: Secondary | ICD-10-CM

## 2021-11-22 DIAGNOSIS — I10 Essential (primary) hypertension: Secondary | ICD-10-CM

## 2021-11-22 DIAGNOSIS — D692 Other nonthrombocytopenic purpura: Secondary | ICD-10-CM | POA: Diagnosis not present

## 2021-11-22 DIAGNOSIS — G232 Striatonigral degeneration: Secondary | ICD-10-CM

## 2021-11-22 DIAGNOSIS — G20 Parkinson's disease: Secondary | ICD-10-CM | POA: Diagnosis not present

## 2021-11-22 DIAGNOSIS — E785 Hyperlipidemia, unspecified: Secondary | ICD-10-CM

## 2021-11-22 NOTE — Patient Instructions (Addendum)
Let us know when you get your [facility] shot at the pharmacy.  ? ?Please stop by lab before you go ?If you have mychart- we will send your results within 3 business days of [place] receiving them.  ?If you do not have mychart- we will call you about results within 5 business days of [place] receiving them.  ?*please also note that you will see labs on mychart as soon as they post. I will later go in and write notes on them- will say "notes from Dr. Yong Channel"  ? ?Nicholas [name]- please irrigate his left ear for wax ? ?Mineral oil for ear full of wax ?Purchase mineral oil from laxative aisle ?Lay down on your side with ear that is bothering you facing up ?Use 3-4 drops with a dropper and place in ear for 30 seconds ?Place cotton swab outside of ear ?Turn to other side and allow this to drain ?Repeat 3-4 x a day ?Return to see [place] if not improving within a few days ? ? ?Recommended follow up: Return in about 4 months (around 03/25/2022) for physical or sooner if needed.Schedule b4 you leave.  ?

## 2021-11-23 ENCOUNTER — Ambulatory Visit: Payer: PPO | Attending: Neurology

## 2021-11-23 DIAGNOSIS — R41841 Cognitive communication deficit: Secondary | ICD-10-CM | POA: Insufficient documentation

## 2021-11-23 DIAGNOSIS — R4701 Aphasia: Secondary | ICD-10-CM | POA: Diagnosis not present

## 2021-11-23 DIAGNOSIS — R471 Dysarthria and anarthria: Secondary | ICD-10-CM | POA: Diagnosis not present

## 2021-11-23 DIAGNOSIS — R1310 Dysphagia, unspecified: Secondary | ICD-10-CM | POA: Insufficient documentation

## 2021-11-23 NOTE — Therapy (Signed)
?[facility] ?[address], [address] ?[place], [place], [address] ?Phone: [phone]   Fax:  [phone] ? ?Speech Language Pathology Treatment ? ?Patient Details  ?Name: Nicholas [name] ?MRN: [id number] ?Date of Birth: [birth year] ?Referring Provider (SLP): Nicholas [name] Guiles, DO ? ? ?Encounter Date: 11/23/2021 ? ? End of Session - 11/23/21 1631   ? ? Visit Number 11   ? Number of Visits 17   ? Date for SLP Re-Evaluation 12/02/21   ? SLP Start Time 1534   ? SLP Stop Time  1620   ? SLP Time Calculation (min) 46 min   ? Activity Tolerance Patient tolerated treatment well   ? ?  ?  ? ?  ? ? ?Past Medical History:  ?Diagnosis Date  ? Cancer [facility]) Jan 2008  ? 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 Hyperlipidemia   ? Hypertension   ? Lymphedema   ? ? ?Past Surgical History:  ?Procedure Laterality Date  ? 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 removal of melanoma of left foot/amputation of toes 1&25 Jul 2006  ? WRIST FRACTURE SURGERY    ? 76 years old-set  ? ? ?There were no vitals filed for this visit. ? ? Subjective Assessment - 11/23/21 1547   ? ? Currently in Pain? Yes   ? Pain Score 5    ? Pain Location Arm   ? Pain Orientation Right   ? Pain Descriptors / Indicators Sore   ? Pain Type Acute pain   ? Pain Onset 1 to 4 weeks ago   ? Pain Frequency Intermittent   ? ?  ?  ? ?  ? ? ? ? ? ? ? ? ADULT SLP TREATMENT - 11/23/21 1601   ? ?  ? General Information  ? Behavior/Cognition Alert;Cooperative;Pleasant mood   ?  ? Treatment Provided  ? Treatment provided Cognitive-Linquistic   ?  ? Cognitive-Linquistic Treatment  ? Treatment focused on Dysarthria   ? Skilled Treatment Pt continues to deny overt s/sx of oral and pharyngeal dysphagia with POs. Upon entering Nicholas [name] demonstrated less than WNL size breath. Nicholas [name] joins pt today. Loud /a/ targeted today to habitualize full breath prior to speaking and using louder speech in conversation - pt averaged in upper low 90s  dB with rare min-mod A for full breath. Everyday sentences pt averaged low-mid 80s dB with rare min cues pt for louder speech and full breath. In sentence responses, occasional mod cues necessary for full breath and for volume to incr to upper 60s dB. Extensive education provided to [facility] today about expectations for pt frequency of louder speech > 5pm, how best to cue pt verbally (short "full breath") and nonverbally (gesturing a deep breath), need for wife to face pt for most efficient communication. Unfortunately wife and pt spend the majority of time together after 6pm daily so SLP made sure Nicholas [name] knew this was the most difficult time for pt to sustain loudness at WNL/more-WNL levels, due to fatigue. Wife demonstrated understanding by verbal acknowledgement ("Thank you for having me join you today, I learned so much!"). Pt would like to cont once/week in order to ensure [name]. SLP agreed.   ?  ? Assessment / Recommendations / Plan  ? Plan --   once a week, per pt request  ?  ? Progression Toward Goals  ? Progression toward goals Progressing toward goals   ? ?  ?  ? ?  ? ? ?  SLP Education - 11/23/21 1631   ? ? Education Details see daily note   ? Person(s) Educated Patient;Spouse   ? Methods Explanation;Demonstration   ? Comprehension Verbalized understanding;Verbal cues required;Returned demonstration   ? ?  ?  ? ?  ? ? ? SLP Short Term Goals - 11/15/21 1536   ? ?  ? SLP SHORT TERM GOAL #1  ? Title pt will sustain loud /a/ at >90 dB average at 30 cm over 3  sessions   ? Baseline 10-13-21   ? Status Partially Met   ?  ? SLP SHORT TERM GOAL #2  ? Title pt will exhibit volume of 70dB in sentence responses 90% with rare min A over 3 sessions   ? Time 4   ? Period Weeks   ? Status Not Met   ? Target Date 11/04/21   ?  ? SLP SHORT TERM GOAL #3  ? Title In 3 minutes simple conversation pt will achieve average speech volume of upper 60s-low 70s dB with occasional min cues over two sessions   ? Time 4   ? Period  Weeks   ? Status Not Met   ? Target Date 11/04/21   ?  ? SLP SHORT TERM GOAL #4  ? Title pt will undergo instrumental swallow eval if clinically indicated   ? Time 4   ? Period Weeks   ? Status Deferred   ? Target Date 11/04/21   ?  ? SLP SHORT TERM GOAL #5  ? Title pt will use breath appropriate for 3 minutes simple conversation 90% of the time in 3 sessions   ? Time 4   ? Period Weeks   ? Status Not Met   ? Target Date 11/04/21   ? ?  ?  ? ?  ? ? ? SLP Long Term Goals - 11/23/21 1632   ? ?  ? SLP LONG TERM GOAL #1  ? Title pt will sustain loud /a/ at average >90dB at 30 cm over 4  sessions   ? Baseline 11-23-21   ? Time 8   ? Period Weeks   or 17 total sessions - for all LTGs  ? Status On-going   ? Target Date 12/02/21   ?  ? SLP LONG TERM GOAL #2  ? Title pt will generate speech in 6 minutes simple conversation with average upper 60s dB average over three sessions   ? Time 8   ? Period Weeks   ? Status Revised   ? Target Date 12/02/21   ?  ? SLP LONG TERM GOAL #3  ? Title pt will use breath appropriate for 6 minutes simple-mod complex conversation 80% of the time in 3 sessions   ? Baseline 11-10-21, 11-23-21   ? Time 6   ? Period Weeks   ? Status On-going   ? Target Date 12/02/21   ?  ? SLP LONG TERM GOAL #4  ? Title pt wife will demo appropriate cues for pt to maximize loudness in 5 mintues conversation with her in 2 sessions   ? Baseline 11-23-21   ? Time 8   ? Period Weeks   ? Status On-going   ? Target Date 12/02/21   ?  ? SLP LONG TERM GOAL #5  ? Title pt will report a better outcome with communication than in the first week of therapy, on a PROM taken in one of the last 2 sessions   ? Time 8   ?  Period Weeks   ? Status On-going   ? Target Date 12/02/21   ? ?  ?  ? ?  ? ? ? Plan - 11/23/21 1631   ? ? 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 SLP has not noted word finding difficulties to date, in conversation with SLP. Wife joined pt today and SLP provided extensive education (see "skilled intervention" for details). Decr in frequency to once/week x2 more visits requested by pt to ensure progress. SLP agreed. I recommend cont'd skilled ST to address dysarthria, and possibly cognitive-communication skills and dysphagia. SLP to monitor diet tolerance; will request MBS orders and schedule objective testing if warranted.   ? Speech Therapy Frequency 1x /week   ? Duration 8 weeks   ? Treatment/Interventions Aspiration precaution training;Diet toleration management by SLP;Trials of upgraded texture/liquids;Cognitive reorganization;Multimodal communcation approach;Compensatory strategies;Pharyngeal strengthening exercises;Language facilitation;Compensatory techniques;Cueing hierarchy;Internal/external aids;Functional tasks;SLP instruction and feedback;Patient/family education;Other (comment)   MBS  ? Potential to Achieve Goals Good   ? SLP Home Exercise Plan loud /a/   ? Consulted and Agree with Plan of Care Patient   ? ?  ?  ? ?  ? ? ?Patient will benefit from skilled therapeutic intervention in order to improve the following deficits and impairments:   ?Dysarthria and anarthria ? ?Aphasia ? ? ? ?Problem List ?Patient Active Problem List  ? Diagnosis Date Noted  ? Parkinson's disease ([place]) 01/11/2021  ? Sepsis without acute organ dysfunction (HCC)   ? Cellulitis 04/12/2020  ? Strain of right biceps 09/12/2017  ? Lymphedema 02/10/2017  ? BPH associated with nocturia 05/16/2016  ? Senile purpura ([place]) 05/03/2016  ? OSA (obstructive sleep apnea) 12/28/2015  ? Hypersomnia 11/15/2015  ? Cough 11/15/2015  ? 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 Dizziness 12/30/2014  ? Diastolic dysfunction 57/97/2820  ? Former smoker 04/29/2014  ? Chronic venous insufficiency 02/20/2014  ? Hyperglycemia 08/02/2012  ? Morbid obesity ([place]) 07/13/2010  ?  History of malignant melanoma. left leg 03/09/2009  ? Insomnia 10/03/2007  ? Hyperlipidemia 12/13/2006  ? Essential hypertension 12/13/2006  ? ? ?[place], [place] ?11/23/2021, 4:34 PM ? ?[place] ?[place]

## 2021-12-01 ENCOUNTER — Other Ambulatory Visit (INDEPENDENT_AMBULATORY_CARE_PROVIDER_SITE_OTHER): Payer: PPO

## 2021-12-01 ENCOUNTER — Other Ambulatory Visit: Payer: Self-pay

## 2021-12-01 DIAGNOSIS — R202 Paresthesia of skin: Secondary | ICD-10-CM

## 2021-12-01 DIAGNOSIS — G20 Parkinson's disease: Secondary | ICD-10-CM

## 2021-12-01 DIAGNOSIS — R739 Hyperglycemia, unspecified: Secondary | ICD-10-CM

## 2021-12-01 DIAGNOSIS — E785 Hyperlipidemia, unspecified: Secondary | ICD-10-CM

## 2021-12-01 DIAGNOSIS — I10 Essential (primary) hypertension: Secondary | ICD-10-CM

## 2021-12-01 DIAGNOSIS — G20C Parkinsonism, unspecified: Secondary | ICD-10-CM

## 2021-12-02 LAB — HEMOGLOBIN A1C: Hgb A1c MFr Bld: 5.9 % (ref 4.6–6.5)

## 2021-12-02 LAB — LIPID PANEL
Cholesterol: 100 mg/dL (ref 0–200)
HDL: 34.2 mg/dL — ABNORMAL LOW (ref >39.00)
LDL Cholesterol: 41 mg/dL (ref 0–99)
NonHDL: 66.02
Total CHOL/HDL Ratio: 3
Triglycerides: 126 mg/dL (ref 0.0–149.0)
VLDL: 25.2 mg/dL (ref 0.0–40.0)

## 2021-12-02 LAB — CBC WITH DIFFERENTIAL/PLATELET
Basophils Absolute: 0 10*3/uL (ref 0.0–0.1)
Basophils Relative: 0.4 % (ref 0.0–3.0)
Eosinophils Absolute: 0.4 10*3/uL (ref 0.0–0.7)
Eosinophils Relative: 5.3 % — ABNORMAL HIGH (ref 0.0–5.0)
HCT: 44.5 % (ref 39.0–52.0)
Hemoglobin: 14.6 g/dL (ref 13.0–17.0)
Lymphocytes Relative: 20.8 % (ref 12.0–46.0)
Lymphs Abs: 1.5 10*3/uL (ref 0.7–4.0)
MCHC: 32.8 g/dL (ref 30.0–36.0)
MCV: 88.6 fl (ref 78.0–100.0)
Monocytes Absolute: 0.5 10*3/uL (ref 0.1–1.0)
Monocytes Relative: 7.2 % (ref 3.0–12.0)
Neutro Abs: 4.7 10*3/uL (ref 1.4–7.7)
Neutrophils Relative %: 66.3 % (ref 43.0–77.0)
Platelets: 181 10*3/uL (ref 150.0–400.0)
RBC: 5.02 Mil/uL (ref 4.22–5.81)
RDW: 14.5 % (ref 11.5–15.5)
WBC: 7.1 10*3/uL (ref 4.0–10.5)

## 2021-12-02 LAB — COMPREHENSIVE METABOLIC PANEL
ALT: 2 U/L (ref 0–53)
AST: 6 U/L (ref 0–37)
Albumin: 4.2 g/dL (ref 3.5–5.2)
Alkaline Phosphatase: 74 U/L (ref 39–117)
BUN: 25 mg/dL — ABNORMAL HIGH (ref 6–23)
CO2: 24 mEq/L (ref 19–32)
Calcium: 9 mg/dL (ref 8.4–10.5)
Chloride: 105 mEq/L (ref 96–112)
Creatinine, Ser: 0.79 mg/dL (ref 0.40–1.50)
GFR: 86.8 mL/min (ref >60.00)
Glucose, Bld: 95 mg/dL (ref 70–99)
Potassium: 4 mEq/L (ref 3.5–5.1)
Sodium: 139 mEq/L (ref 135–145)
Total Bilirubin: 0.4 mg/dL (ref 0.2–1.2)
Total Protein: 6.3 g/dL (ref 6.0–8.3)

## 2021-12-02 LAB — VITAMIN B12: Vitamin B-12: 229 pg/mL (ref 211–911)

## 2021-12-02 LAB — TSH: TSH: 1.56 u[IU]/mL (ref 0.35–5.50)

## 2021-12-03 ENCOUNTER — Other Ambulatory Visit: Payer: Self-pay | Admitting: Neurology

## 2021-12-03 DIAGNOSIS — G20 Parkinson's disease: Secondary | ICD-10-CM

## 2021-12-05 ENCOUNTER — Other Ambulatory Visit: Payer: Self-pay

## 2021-12-06 ENCOUNTER — Ambulatory Visit: Payer: PPO

## 2021-12-08 ENCOUNTER — Ambulatory Visit: Payer: PPO

## 2021-12-13 ENCOUNTER — Ambulatory Visit: Payer: PPO

## 2021-12-13 DIAGNOSIS — R471 Dysarthria and anarthria: Secondary | ICD-10-CM | POA: Diagnosis not present

## 2021-12-13 DIAGNOSIS — R4701 Aphasia: Secondary | ICD-10-CM

## 2021-12-13 DIAGNOSIS — R1310 Dysphagia, unspecified: Secondary | ICD-10-CM

## 2021-12-13 DIAGNOSIS — R41841 Cognitive communication deficit: Secondary | ICD-10-CM

## 2021-12-14 ENCOUNTER — Telehealth: Payer: Self-pay | Admitting: Family Medicine

## 2021-12-14 ENCOUNTER — Other Ambulatory Visit: Payer: Self-pay | Admitting: Family Medicine

## 2021-12-14 DIAGNOSIS — G20 Parkinson's disease: Secondary | ICD-10-CM

## 2021-12-14 NOTE — Therapy (Signed)
[facility] [address] [place], [place], [address] Phone: [phone]   Fax:  [phone]  Speech Language Pathology Treatment/Renewal Summary  Patient Details  Name: Nicholas [name] MRN: [id number] Date of Birth: 12/25/1945 Referring Provider (SLP): Nicholas Bogus, DO   Encounter Date: 12/13/2021   End of Session - 12/14/21 1351     Visit Number 12    Number of Visits 17    Date for SLP Re-Evaluation 12/29/21    SLP Start Time 1532    SLP Stop Time  1614    SLP Time Calculation (min) 42 min    Activity Tolerance Patient tolerated treatment well             Past Medical History:  Diagnosis Date   Cancer [facility]) Jan 2008   skin; hx of melanoma left foot/ amputation of toes 1&2    Hyperlipidemia    Hypertension    Lymphedema     Past Surgical History:  Procedure Laterality Date   4th toe- 2nd primary melanoma  2009   removal of melanoma of left foot/amputation of toes 1&25 Jul 2006   WRIST FRACTURE SURGERY     76 years old-set    There were no vitals filed for this visit.   Subjective Assessment - 12/14/21 1345     Subjective "No poker tonight." (with voicing)    Currently in Pain? Yes    Pain Score 4     Pain Location Arm    Pain Orientation Right    Pain Descriptors / Indicators Sore    Pain Type Acute pain    Pain Onset 1 to 4 weeks ago                   ADULT SLP TREATMENT - 12/14/21 1346       General Information   Behavior/Cognition Alert;Cooperative;Pleasant mood      Treatment Provided   Treatment provided Cognitive-Linquistic      Cognitive-Linquistic Treatment   Treatment focused on Dysarthria    Skilled Treatment Upon entering ST room Nicholas demonstrated less than WNL voluime. Loud /a/ targeted today to habitualize full breath prior to speaking and using louder speech in conversation - pt averaged in low 90s dB with rare min A for full breath. Pt reports he has completed these almost  every day since previous session. Everyday sentences pt averaged low-mid 80s dB with occasional min-mod cues pt for louder speech and full breath. In short ocnversational segments pt generated average mid 60s with occasional upper 60s dB. Pt indicated that it was appropriate for next session to be his last session.      Assessment / Recommendations / Plan   Plan Continue with current plan of care   d/c very likely next session     Progression Toward Goals   Progression toward goals Progressing toward goals                SLP Short Term Goals - 11/15/21 1536       SLP SHORT TERM GOAL #1   Title pt will sustain loud /a/ at >90 dB average at 30 cm over 3  sessions    Baseline 10-13-21    Status Partially Met      SLP SHORT TERM GOAL #2   Title pt will exhibit volume of 70dB in sentence responses 90% with rare min A over 3 sessions    Time 4    Period  Weeks    Status Not Met    Target Date 11/04/21      SLP SHORT TERM GOAL #3   Title In 3 minutes simple conversation pt will achieve average speech volume of upper 60s-low 70s dB with occasional min cues over two sessions    Time 4    Period Weeks    Status Not Met    Target Date 11/04/21      SLP SHORT TERM GOAL #4   Title pt will undergo instrumental swallow eval if clinically indicated    Time 4    Period Weeks    Status Deferred    Target Date 11/04/21      SLP SHORT TERM GOAL #5   Title pt will use breath appropriate for 3 minutes simple conversation 90% of the time in 3 sessions    Time 4    Period Weeks    Status Not Met    Target Date 11/04/21              SLP Long Term Goals - 12/14/21 1354       SLP LONG TERM GOAL #1   Title pt will sustain loud /a/ at average >90dB at 30 cm over 4  sessions    Baseline 11-23-21, 12-13-21    Time 8    Period Weeks   or 17 total sessions - for all LTGs   Status On-going    Target Date 12/29/21      SLP LONG TERM GOAL #2   Title pt will generate speech in 6 minutes  simple conversation with average upper 60s dB average over three sessions    Time 8    Period Weeks    Status On-going    Target Date 12/29/21      SLP LONG TERM GOAL #3   Title pt will use breath appropriate for 6 minutes simple-mod complex conversation 80% of the time in 3 sessions    Baseline 11-10-21, 11-23-21    Status Achieved    Target Date 12/29/21      SLP LONG TERM GOAL #4   Title pt wife will demo appropriate cues for pt to maximize loudness in 5 mintues conversation with her in 2 sessions    Baseline 11-23-21    Time 8    Period Weeks    Status On-going    Target Date 12/29/21      SLP LONG TERM GOAL #5   Title pt will report a better outcome with communication than in the first week of therapy, on a PROM taken in one of the last 2 sessions    Time 8    Period Weeks    Status On-going    Target Date 12/29/21              Plan - 12/14/21 1352     Clinical Impression Statement Nicholas [name] "Nicholas" [name] 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 Today no aphonia was heard.  SLP has not noted word finding difficulties to date. I recommend cont'd skilled ST to address dysarthria, and possibly cognitive-communication skills and dysphagia. SLP to monitor diet tolerance; will request MBS orders and schedule objective testing if warranted. Discharge very likely next session per pt request.    Speech Therapy Frequency 1x /week    Duration 8 weeks    Treatment/Interventions Aspiration precaution training;Diet toleration management by SLP;Trials of upgraded texture/liquids;Cognitive reorganization;Multimodal communcation approach;Compensatory  strategies;Pharyngeal strengthening exercises;Language facilitation;Compensatory techniques;Cueing hierarchy;Internal/external aids;Functional tasks;SLP instruction and feedback;Patient/family education;Other (comment)   MBS   Potential to  Achieve Goals Good    SLP Home Exercise Plan loud /a/    Consulted and Agree with Plan of Care Patient             Patient will benefit from skilled therapeutic intervention in order to improve the following deficits and impairments:   Dysarthria and anarthria  Cognitive communication deficit  Aphasia  Dysphagia, unspecified type    Problem List Patient Active Problem List   Diagnosis Date Noted   Parkinson's disease ([place]) 01/11/2021   Sepsis without acute organ dysfunction ([place])    Cellulitis 04/12/2020   Strain of right biceps 09/12/2017   Lymphedema 02/10/2017   BPH associated with nocturia 05/16/2016   Senile purpura ([place]) 05/03/2016   OSA (obstructive sleep apnea) 12/28/2015   Hypersomnia 11/15/2015   Cough 11/15/2015   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 left leg 03/09/2009   Insomnia 10/03/2007   Hyperlipidemia 12/13/2006   Essential hypertension 12/13/2006    Yarexi Pawlicki, [place] 12/14/2021, 1:55 PM  [facility] [address] [place], [place], [address] Phone: [phone]   Fax:  [phone]   Name: Nicholas [name] MRN: [id number] Date of Birth: [birth year]

## 2021-12-14 NOTE — Telephone Encounter (Signed)
Patient called stating he would like Dr Yong Channel to refer him to OT therapy-

## 2021-12-14 NOTE — Telephone Encounter (Signed)
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

## 2021-12-14 NOTE — Telephone Encounter (Signed)
Ok to refer? If so, what diagnosis?

## 2021-12-14 NOTE — Telephone Encounter (Signed)
Called and lm for pt tcb. 

## 2021-12-15 NOTE — Telephone Encounter (Signed)
Referral has been edited.

## 2021-12-15 NOTE — Telephone Encounter (Signed)
Pt called back and stated the OT referral needs to be sent to [facility] [phone]

## 2021-12-15 NOTE — Addendum Note (Signed)
Addended by: Clyde Lundborg A on: 12/15/2021 04:15 PM   Modules accepted: Orders

## 2021-12-15 NOTE — Telephone Encounter (Signed)
Referral has been placed, will edited if needed when pt returns call.

## 2021-12-16 ENCOUNTER — Ambulatory Visit: Payer: PPO

## 2021-12-20 ENCOUNTER — Ambulatory Visit: Payer: PPO

## 2022-01-03 ENCOUNTER — Ambulatory Visit: Payer: PPO | Attending: Neurology

## 2022-01-03 DIAGNOSIS — R471 Dysarthria and anarthria: Secondary | ICD-10-CM | POA: Insufficient documentation

## 2022-01-03 NOTE — Patient Instructions (Signed)
  Continue your loud "ah"s every day, along with your sentences.  Whenever you open your mouth you need to have a breath like the loud "ah"s.  I'm glad [name] learned some things from her session here with [place].   I'm here if/when you need me again!

## 2022-01-03 NOTE — Therapy (Signed)
[facility] [address], [place], [place], [address] Phone: [phone]   Fax:  [phone]  Speech Language Pathology Treatment/Discharge summary  Patient Details  Name: Nicholas [name] MRN: [id number] Date of Birth: 1946-04-08 Referring Provider (SLP): Alonza Bogus, DO   Encounter Date: 01/03/2022   End of Session - 01/03/22 1450     Visit Number 13    Number of Visits 17    Date for SLP Re-Evaluation 01/03/22    SLP Start Time 2233    SLP Stop Time  6122    SLP Time Calculation (min) 40 min    Activity Tolerance Patient tolerated treatment well             Past Medical History:  Diagnosis Date   Cancer [facility]) Jan 2008   skin; hx of melanoma left foot/ amputation of toes 1&2    Hyperlipidemia    Hypertension    Lymphedema     Past Surgical History:  Procedure Laterality Date   4th toe- 2nd primary melanoma  2009   removal of melanoma of left foot/amputation of toes 1&25 Jul 2006   WRIST FRACTURE SURGERY     76 years old-set    SPEECH THERAPY DISCHARGE SUMMARY  Visits from Start of Care: 13  Current functional level related to goals / functional outcomes: See below. Pt stated he was pleased with progress during this plan of care, especially with education with his wife about expectations and hints to make communication easier for them at home.   Remaining deficits: Dysarthria persists.   Education / Equipment: Easing communication interactions at home, necessity of maintaining loud /a/ and everyday sentences at home, importance of using breath identical to loud /a/ when speaking.   Patient agrees to discharge. Patient goals were partially met. Patient is being discharged due to being pleased with the current functional level..  ]   There were no vitals filed for this visit.   Subjective Assessment - 01/03/22 1415     Subjective "It's quite a bit better." (pt, re: communication with wife since  initiation of ST)    Currently in Pain? Yes    Pain Score 3     Pain Location Arm    Pain Orientation Upper    Pain Descriptors / Indicators Nagging    Pain Onset More than a month ago    Pain Frequency Intermittent    Aggravating Factors  movement    Effect of Pain on Daily Activities more difficult to use rt arm                   ADULT SLP TREATMENT - 01/03/22 1412       General Information   Behavior/Cognition Alert;Cooperative;Pleasant mood      Treatment Provided   Treatment provided Cognitive-Linquistic      Cognitive-Linquistic Treatment   Treatment focused on Dysarthria    Skilled Treatment Upon entering [place] demonstrated less than WNL volume. [name] said the session with [name] in the [place] room was helpful to have her manage expectations - biggest help has been her learning in that session that the most effective communication with Leroy Sea occurs face to face at this time. Loud /a/ targeted today to habitualize full breath prior to speaking and using louder speech in conversation - pt averaged in low 90s dB with occasional min A for full breath. Pt reports he has completed these almost every day since previous session. Everyday  sentences pt averaged low-mid 80s dB with occasional min-mod cues pt for louder speech and full breath. In short conversational segments pt again generated average mid - upper 60s dB. Pt has remained stable since his previous session and states he is pleased with progress.      Assessment / Recommendations / Plan   Plan Other (Comment)   d/c - pt satisfied with progress     Progression Toward Goals   Progression toward goals --   d/c day - see goals             SLP Education - 01/03/22 1450     Education Details need to cont loud /a/ and everyday sentences, need to have same breath with /a/ as you do when you talk    Person(s) Educated Patient    Methods Explanation    Comprehension Verbalized understanding               SLP Short Term Goals - 11/15/21 1536       SLP SHORT TERM GOAL #1   Title pt will sustain loud /a/ at >90 dB average at 30 cm over 3  sessions    Baseline 10-13-21    Status Partially Met      SLP SHORT TERM GOAL #2   Title pt will exhibit volume of 70dB in sentence responses 90% with rare min A over 3 sessions    Time 4    Period Weeks    Status Not Met    Target Date 11/04/21      SLP SHORT TERM GOAL #3   Title In 3 minutes simple conversation pt will achieve average speech volume of upper 60s-low 70s dB with occasional min cues over two sessions    Time 4    Period Weeks    Status Not Met    Target Date 11/04/21      SLP SHORT TERM GOAL #4   Title pt will undergo instrumental swallow eval if clinically indicated    Time 4    Period Weeks    Status Deferred    Target Date 11/04/21      SLP SHORT TERM GOAL #5   Title pt will use breath appropriate for 3 minutes simple conversation 90% of the time in 3 sessions    Time 4    Period Weeks    Status Not Met    Target Date 11/04/21              SLP Long Term Goals - 01/03/22 1740       SLP LONG TERM GOAL #1   Title pt will sustain loud /a/ at average >90dB at 30 cm over 4  sessions    Baseline 11-23-21, 12-13-21, 01-03-22    Time --    Period --   or 17 total sessions - for all LTGs   Status Partially Met      SLP LONG TERM GOAL #2   Title pt will generate speech in 6 minutes simple conversation with average upper 60s dB average over three sessions    Time --    Period --    Status Not Met      SLP LONG TERM GOAL #3   Title pt will use breath appropriate for 6 minutes simple-mod complex conversation 80% of the time in 3 sessions    Baseline 11-10-21, 11-23-21    Status Achieved      SLP LONG TERM GOAL #4   Title pt  wife will demo appropriate cues for pt to maximize loudness in 5 mintues conversation with her in 2 sessions    Baseline 11-23-21    Time --    Period --    Status Partially Met   wife arrived to one  session in pt's [place] #5   Title pt will report a better outcome with communication than in the first week of therapy, on a PROM taken in one of the last 2 sessions    Time --    Period --    Status Achieved              Plan - 01/03/22 1450     Clinical Impression Statement Romona Curls "[name]" 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 Today aphonia was heard x3; pt responded positively when asked x4  to repeat, taking larger breath and with louder phonation, improving intelligibility.  SLP has not noted word finding difficulties during the therapy course.Leroy Sea is pleased with progress and indicated discharge was appropriate today.    Treatment/Interventions Aspiration precaution training;Diet toleration management by SLP;Trials of upgraded texture/liquids;Cognitive reorganization;Multimodal communcation approach;Compensatory strategies;Pharyngeal strengthening exercises;Language facilitation;Compensatory techniques;Cueing hierarchy;Internal/external aids;Functional tasks;SLP instruction and feedback;Patient/family education;Other (comment)   MBS   Potential to Achieve Goals Good    SLP Home Exercise Plan loud /a/    Consulted and Agree with Plan of Care Patient             Patient will benefit from skilled therapeutic intervention in order to improve the following deficits and impairments:   Dysarthria and anarthria    Problem List Patient Active Problem List   Diagnosis Date Noted   Parkinson's disease ([place]) 01/11/2021   Sepsis without acute organ dysfunction ([place])    Cellulitis 04/12/2020   Strain of right biceps 09/12/2017   Lymphedema 02/10/2017   BPH associated with nocturia 05/16/2016   Senile purpura ([place]) 05/03/2016   OSA (obstructive sleep apnea) 12/28/2015   Hypersomnia 11/15/2015   Cough 11/15/2015   Parkinson's plus syndrome  ([place]) 06/22/2015   Dizziness 44/31/5400   Diastolic dysfunction 86/76/1950   Former smoker 04/29/2014   Chronic venous insufficiency 02/20/2014   Hyperglycemia 08/02/2012   Morbid obesity ([place]) 07/13/2010   History of malignant melanoma. left leg 03/09/2009   Insomnia 10/03/2007   Hyperlipidemia 12/13/2006   Essential hypertension 12/13/2006    [place], [place] 01/03/2022, 5:43 PM  [facility] [address], [place], [place], [address] Phone: [phone]   Fax:  [phone]   Name: JOSPH NORFLEET MRN: [id number] Date of Birth: 1946-02-09

## 2022-01-16 DIAGNOSIS — G20 Parkinson's disease: Secondary | ICD-10-CM | POA: Diagnosis not present

## 2022-01-16 DIAGNOSIS — M6281 Muscle weakness (generalized): Secondary | ICD-10-CM | POA: Diagnosis not present

## 2022-01-16 DIAGNOSIS — R278 Other lack of coordination: Secondary | ICD-10-CM | POA: Diagnosis not present

## 2022-01-19 DIAGNOSIS — G20 Parkinson's disease: Secondary | ICD-10-CM | POA: Diagnosis not present

## 2022-01-19 DIAGNOSIS — R278 Other lack of coordination: Secondary | ICD-10-CM | POA: Diagnosis not present

## 2022-01-19 DIAGNOSIS — M6281 Muscle weakness (generalized): Secondary | ICD-10-CM | POA: Diagnosis not present

## 2022-01-23 ENCOUNTER — Telehealth: Payer: Self-pay | Admitting: Family Medicine

## 2022-01-23 NOTE — Telephone Encounter (Signed)
He can but likely will just pee out the extra and not sure will give additional benefit

## 2022-01-23 NOTE — Telephone Encounter (Signed)
See below

## 2022-01-23 NOTE — Telephone Encounter (Signed)
Pt states B12 supplements have been helping with tingling in hand.  Pt requests approval to increase dosage from one to two.  Pt requests a call back.  Pt understands that PCP team may not be available until Wednesday at the soonest.

## 2022-01-25 DIAGNOSIS — G20 Parkinson's disease: Secondary | ICD-10-CM | POA: Diagnosis not present

## 2022-01-25 DIAGNOSIS — M6281 Muscle weakness (generalized): Secondary | ICD-10-CM | POA: Diagnosis not present

## 2022-01-25 DIAGNOSIS — R278 Other lack of coordination: Secondary | ICD-10-CM | POA: Diagnosis not present

## 2022-01-25 NOTE — Telephone Encounter (Signed)
Called and left below message on pt vm. 

## 2022-01-26 DIAGNOSIS — R278 Other lack of coordination: Secondary | ICD-10-CM | POA: Diagnosis not present

## 2022-01-26 DIAGNOSIS — M6281 Muscle weakness (generalized): Secondary | ICD-10-CM | POA: Diagnosis not present

## 2022-01-26 DIAGNOSIS — G20 Parkinson's disease: Secondary | ICD-10-CM | POA: Diagnosis not present

## 2022-01-30 DIAGNOSIS — G20 Parkinson's disease: Secondary | ICD-10-CM | POA: Diagnosis not present

## 2022-01-30 DIAGNOSIS — M6281 Muscle weakness (generalized): Secondary | ICD-10-CM | POA: Diagnosis not present

## 2022-01-30 DIAGNOSIS — R278 Other lack of coordination: Secondary | ICD-10-CM | POA: Diagnosis not present

## 2022-02-01 DIAGNOSIS — G20 Parkinson's disease: Secondary | ICD-10-CM | POA: Diagnosis not present

## 2022-02-01 DIAGNOSIS — M6281 Muscle weakness (generalized): Secondary | ICD-10-CM | POA: Diagnosis not present

## 2022-02-01 DIAGNOSIS — R278 Other lack of coordination: Secondary | ICD-10-CM | POA: Diagnosis not present

## 2022-02-08 DIAGNOSIS — M6281 Muscle weakness (generalized): Secondary | ICD-10-CM | POA: Diagnosis not present

## 2022-02-08 DIAGNOSIS — R278 Other lack of coordination: Secondary | ICD-10-CM | POA: Diagnosis not present

## 2022-02-08 DIAGNOSIS — G20 Parkinson's disease: Secondary | ICD-10-CM | POA: Diagnosis not present

## 2022-02-13 ENCOUNTER — Other Ambulatory Visit: Payer: Self-pay | Admitting: Family Medicine

## 2022-02-13 DIAGNOSIS — R278 Other lack of coordination: Secondary | ICD-10-CM | POA: Diagnosis not present

## 2022-02-13 DIAGNOSIS — G20 Parkinson's disease: Secondary | ICD-10-CM | POA: Diagnosis not present

## 2022-02-13 DIAGNOSIS — M6281 Muscle weakness (generalized): Secondary | ICD-10-CM | POA: Diagnosis not present

## 2022-02-15 DIAGNOSIS — M6281 Muscle weakness (generalized): Secondary | ICD-10-CM | POA: Diagnosis not present

## 2022-02-15 DIAGNOSIS — G20 Parkinson's disease: Secondary | ICD-10-CM | POA: Diagnosis not present

## 2022-02-15 DIAGNOSIS — R278 Other lack of coordination: Secondary | ICD-10-CM | POA: Diagnosis not present

## 2022-02-22 DIAGNOSIS — G20 Parkinson's disease: Secondary | ICD-10-CM | POA: Diagnosis not present

## 2022-02-22 DIAGNOSIS — M6281 Muscle weakness (generalized): Secondary | ICD-10-CM | POA: Diagnosis not present

## 2022-02-22 DIAGNOSIS — R278 Other lack of coordination: Secondary | ICD-10-CM | POA: Diagnosis not present

## 2022-02-23 ENCOUNTER — Other Ambulatory Visit: Payer: Self-pay

## 2022-02-23 ENCOUNTER — Emergency Department (HOSPITAL_COMMUNITY): Payer: PPO

## 2022-02-23 ENCOUNTER — Inpatient Hospital Stay (HOSPITAL_COMMUNITY)
Admission: EM | Admit: 2022-02-23 | Discharge: 2022-03-02 | DRG: 640 | Disposition: A | Discharge status: Home / Self Care | Payer: PPO | Attending: Internal Medicine | Admitting: Internal Medicine

## 2022-02-23 ENCOUNTER — Encounter (HOSPITAL_COMMUNITY): Payer: Self-pay

## 2022-02-23 DIAGNOSIS — L89326 Pressure-induced deep tissue damage of left buttock: Secondary | ICD-10-CM | POA: Diagnosis present

## 2022-02-23 DIAGNOSIS — R062 Wheezing: Secondary | ICD-10-CM | POA: Diagnosis present

## 2022-02-23 DIAGNOSIS — I110 Hypertensive heart disease with heart failure: Secondary | ICD-10-CM | POA: Diagnosis not present

## 2022-02-23 DIAGNOSIS — L89302 Pressure ulcer of unspecified buttock, stage 2: Secondary | ICD-10-CM | POA: Diagnosis not present

## 2022-02-23 DIAGNOSIS — Z85820 Personal history of malignant melanoma of skin: Secondary | ICD-10-CM | POA: Diagnosis not present

## 2022-02-23 DIAGNOSIS — Z6835 Body mass index (BMI) 35.0-35.9, adult: Secondary | ICD-10-CM | POA: Diagnosis not present

## 2022-02-23 DIAGNOSIS — Z66 Do not resuscitate: Secondary | ICD-10-CM | POA: Diagnosis not present

## 2022-02-23 DIAGNOSIS — E785 Hyperlipidemia, unspecified: Secondary | ICD-10-CM | POA: Diagnosis present

## 2022-02-23 DIAGNOSIS — R7303 Prediabetes: Secondary | ICD-10-CM | POA: Diagnosis not present

## 2022-02-23 DIAGNOSIS — N401 Enlarged prostate with lower urinary tract symptoms: Secondary | ICD-10-CM | POA: Diagnosis not present

## 2022-02-23 DIAGNOSIS — Z8249 Family history of ischemic heart disease and other diseases of the circulatory system: Secondary | ICD-10-CM

## 2022-02-23 DIAGNOSIS — Z79899 Other long term (current) drug therapy: Secondary | ICD-10-CM | POA: Diagnosis not present

## 2022-02-23 DIAGNOSIS — G20 Parkinson's disease: Secondary | ICD-10-CM | POA: Diagnosis present

## 2022-02-23 DIAGNOSIS — Z5320 Procedure and treatment not carried out because of patient's decision for unspecified reasons: Secondary | ICD-10-CM | POA: Diagnosis not present

## 2022-02-23 DIAGNOSIS — I10 Essential (primary) hypertension: Secondary | ICD-10-CM | POA: Diagnosis present

## 2022-02-23 DIAGNOSIS — I890 Lymphedema, not elsewhere classified: Secondary | ICD-10-CM | POA: Diagnosis present

## 2022-02-23 DIAGNOSIS — E669 Obesity, unspecified: Secondary | ICD-10-CM | POA: Diagnosis not present

## 2022-02-23 DIAGNOSIS — R531 Weakness: Secondary | ICD-10-CM | POA: Diagnosis not present

## 2022-02-23 DIAGNOSIS — E872 Acidosis, unspecified: Secondary | ICD-10-CM | POA: Diagnosis not present

## 2022-02-23 DIAGNOSIS — G4733 Obstructive sleep apnea (adult) (pediatric): Secondary | ICD-10-CM | POA: Diagnosis present

## 2022-02-23 DIAGNOSIS — R351 Nocturia: Secondary | ICD-10-CM | POA: Diagnosis present

## 2022-02-23 DIAGNOSIS — Z823 Family history of stroke: Secondary | ICD-10-CM | POA: Diagnosis not present

## 2022-02-23 DIAGNOSIS — Z20822 Contact with and (suspected) exposure to covid-19: Secondary | ICD-10-CM | POA: Diagnosis not present

## 2022-02-23 DIAGNOSIS — Z87891 Personal history of nicotine dependence: Secondary | ICD-10-CM

## 2022-02-23 DIAGNOSIS — I5031 Acute diastolic (congestive) heart failure: Secondary | ICD-10-CM | POA: Diagnosis not present

## 2022-02-23 DIAGNOSIS — R6510 Systemic inflammatory response syndrome (SIRS) of non-infectious origin without acute organ dysfunction: Secondary | ICD-10-CM | POA: Diagnosis not present

## 2022-02-23 DIAGNOSIS — R000 Tachycardia, unspecified: Secondary | ICD-10-CM | POA: Diagnosis not present

## 2022-02-23 DIAGNOSIS — R739 Hyperglycemia, unspecified: Secondary | ICD-10-CM | POA: Diagnosis present

## 2022-02-23 DIAGNOSIS — E7800 Pure hypercholesterolemia, unspecified: Secondary | ICD-10-CM | POA: Diagnosis not present

## 2022-02-23 DIAGNOSIS — A419 Sepsis, unspecified organism: Secondary | ICD-10-CM | POA: Diagnosis present

## 2022-02-23 DIAGNOSIS — I959 Hypotension, unspecified: Secondary | ICD-10-CM | POA: Diagnosis not present

## 2022-02-23 DIAGNOSIS — Z993 Dependence on wheelchair: Secondary | ICD-10-CM

## 2022-02-23 DIAGNOSIS — N3281 Overactive bladder: Secondary | ICD-10-CM | POA: Diagnosis not present

## 2022-02-23 DIAGNOSIS — I5033 Acute on chronic diastolic (congestive) heart failure: Secondary | ICD-10-CM | POA: Diagnosis not present

## 2022-02-23 DIAGNOSIS — R279 Unspecified lack of coordination: Secondary | ICD-10-CM | POA: Diagnosis not present

## 2022-02-23 DIAGNOSIS — Z880 Allergy status to penicillin: Secondary | ICD-10-CM

## 2022-02-23 DIAGNOSIS — I509 Heart failure, unspecified: Secondary | ICD-10-CM | POA: Diagnosis not present

## 2022-02-23 DIAGNOSIS — Z8349 Family history of other endocrine, nutritional and metabolic diseases: Secondary | ICD-10-CM

## 2022-02-23 DIAGNOSIS — R69 Illness, unspecified: Secondary | ICD-10-CM

## 2022-02-23 DIAGNOSIS — Z743 Need for continuous supervision: Secondary | ICD-10-CM | POA: Diagnosis not present

## 2022-02-23 LAB — PROTIME-INR
INR: 1.3 — ABNORMAL HIGH (ref 0.8–1.2)
Prothrombin Time: 15.7 seconds — ABNORMAL HIGH (ref 11.4–15.2)

## 2022-02-23 LAB — URINALYSIS, ROUTINE W REFLEX MICROSCOPIC
Bilirubin Urine: NEGATIVE
Glucose, UA: NEGATIVE mg/dL
Hgb urine dipstick: NEGATIVE
Ketones, ur: 5 mg/dL — AB
Leukocytes,Ua: NEGATIVE
Nitrite: NEGATIVE
Protein, ur: NEGATIVE mg/dL
Specific Gravity, Urine: 1.014 (ref 1.005–1.030)
pH: 5 (ref 5.0–8.0)

## 2022-02-23 LAB — COMPREHENSIVE METABOLIC PANEL
ALT: 6 U/L (ref 0–44)
AST: 13 U/L — ABNORMAL LOW (ref 15–41)
Albumin: 3.8 g/dL (ref 3.5–5.0)
Alkaline Phosphatase: 59 U/L (ref 38–126)
Anion gap: 7 (ref 5–15)
BUN: 33 mg/dL — ABNORMAL HIGH (ref 8–23)
CO2: 20 mmol/L — ABNORMAL LOW (ref 22–32)
Calcium: 8.5 mg/dL — ABNORMAL LOW (ref 8.9–10.3)
Chloride: 110 mmol/L (ref 98–111)
Creatinine, Ser: 1.16 mg/dL (ref 0.61–1.24)
GFR, Estimated: >60 mL/min (ref >60)
Glucose, Bld: 127 mg/dL — ABNORMAL HIGH (ref 70–99)
Potassium: 4.2 mmol/L (ref 3.5–5.1)
Sodium: 137 mmol/L (ref 135–145)
Total Bilirubin: 0.8 mg/dL (ref 0.3–1.2)
Total Protein: 6.5 g/dL (ref 6.5–8.1)

## 2022-02-23 LAB — CBC WITH DIFFERENTIAL/PLATELET
Abs Immature Granulocytes: 0.25 10*3/uL — ABNORMAL HIGH (ref 0.00–0.07)
Basophils Absolute: 0 10*3/uL (ref 0.0–0.1)
Basophils Relative: 0 %
Eosinophils Absolute: 0 10*3/uL (ref 0.0–0.5)
Eosinophils Relative: 0 %
HCT: 44.9 % (ref 39.0–52.0)
Hemoglobin: 14.6 g/dL (ref 13.0–17.0)
Immature Granulocytes: 1 %
Lymphocytes Relative: 2 %
Lymphs Abs: 0.6 10*3/uL — ABNORMAL LOW (ref 0.7–4.0)
MCH: 29.7 pg (ref 26.0–34.0)
MCHC: 32.5 g/dL (ref 30.0–36.0)
MCV: 91.3 fL (ref 80.0–100.0)
Monocytes Absolute: 1.2 10*3/uL — ABNORMAL HIGH (ref 0.1–1.0)
Monocytes Relative: 5 %
Neutro Abs: 22.3 10*3/uL — ABNORMAL HIGH (ref 1.7–7.7)
Neutrophils Relative %: 92 %
Platelets: 194 10*3/uL (ref 150–400)
RBC: 4.92 MIL/uL (ref 4.22–5.81)
RDW: 14.9 % (ref 11.5–15.5)
WBC: 24.3 10*3/uL — ABNORMAL HIGH (ref 4.0–10.5)
nRBC: 0 % (ref 0.0–0.2)

## 2022-02-23 LAB — LACTIC ACID, PLASMA
Lactic Acid, Venous: 2.5 mmol/L (ref 0.5–1.9)
Lactic Acid, Venous: 6 mmol/L (ref 0.5–1.9)
Lactic Acid, Venous: 1.5 mmol/L (ref 0.5–1.9)
Lactic Acid, Venous: 1.1 mmol/L (ref 0.5–1.9)

## 2022-02-23 LAB — RESP PANEL BY RT-PCR (FLU A&B, COVID) ARPGX2
Influenza A by PCR: NEGATIVE
Influenza B by PCR: NEGATIVE
SARS Coronavirus 2 by RT PCR: NEGATIVE

## 2022-02-23 LAB — MRSA NEXT GEN BY PCR, NASAL: MRSA by PCR Next Gen: NOT DETECTED

## 2022-02-23 MED ORDER — CARBIDOPA-LEVODOPA 25-100 MG PO TABS
2.0000 | ORAL_TABLET | Freq: Four times a day (QID) | ORAL | Status: DC
  Administered 2022-02-23 – 2022-03-02 (×28): 2 via ORAL
  Filled 2022-02-23 (×28): qty 2

## 2022-02-23 MED ORDER — LACTATED RINGERS IV SOLN: INTRAVENOUS | Status: AC

## 2022-02-23 MED ORDER — LACTATED RINGERS IV BOLUS
1000.0000 mL | Freq: Once | INTRAVENOUS | Status: AC
  Administered 2022-02-23: 1000 mL via INTRAVENOUS

## 2022-02-23 MED ORDER — ALBUTEROL SULFATE (2.5 MG/3ML) 0.083% IN NEBU: 2.5000 mg | INHALATION_SOLUTION | Freq: Four times a day (QID) | RESPIRATORY_TRACT | Status: DC | PRN

## 2022-02-23 MED ORDER — ACETAMINOPHEN 650 MG RE SUPP: 650.0000 mg | Freq: Four times a day (QID) | RECTAL | Status: DC | PRN

## 2022-02-23 MED ORDER — VANCOMYCIN HCL 2000 MG/400ML IV SOLN
2000.0000 mg | Freq: Once | INTRAVENOUS | Status: AC
Start: 2022-02-23 — End: 2022-02-23
  Administered 2022-02-23: 2000 mg via INTRAVENOUS
  Filled 2022-02-23: qty 400

## 2022-02-23 MED ORDER — ENOXAPARIN SODIUM 40 MG/0.4ML IJ SOSY
40.0000 mg | PREFILLED_SYRINGE | INTRAMUSCULAR | Status: DC
  Administered 2022-02-23 – 2022-03-01 (×7): 40 mg via SUBCUTANEOUS
  Filled 2022-02-23 (×7): qty 0.4

## 2022-02-23 MED ORDER — METRONIDAZOLE 500 MG/100ML IV SOLN
500.0000 mg | Freq: Once | INTRAVENOUS | Status: AC
  Administered 2022-02-23: 500 mg via INTRAVENOUS
  Filled 2022-02-23: qty 100

## 2022-02-23 MED ORDER — ACETAMINOPHEN 500 MG PO TABS
1000.0000 mg | ORAL_TABLET | Freq: Once | ORAL | Status: AC
  Administered 2022-02-23: 1000 mg via ORAL
  Filled 2022-02-23: qty 2

## 2022-02-23 MED ORDER — CHLORHEXIDINE GLUCONATE CLOTH 2 % EX PADS
6.0000 | MEDICATED_PAD | Freq: Every day | CUTANEOUS | Status: DC
Start: 2022-02-23 — End: 2022-03-02
  Administered 2022-02-23 – 2022-03-01 (×7): 6 via TOPICAL

## 2022-02-23 MED ORDER — ROSUVASTATIN CALCIUM 10 MG PO TABS
10.0000 mg | ORAL_TABLET | Freq: Every day | ORAL | Status: DC
  Administered 2022-02-23 – 2022-03-01 (×7): 10 mg via ORAL
  Filled 2022-02-23 (×7): qty 1

## 2022-02-23 MED ORDER — ONDANSETRON HCL 4 MG PO TABS: 4.0000 mg | ORAL_TABLET | Freq: Four times a day (QID) | ORAL | Status: DC | PRN

## 2022-02-23 MED ORDER — LACTATED RINGERS IV BOLUS (SEPSIS)
1000.0000 mL | Freq: Once | INTRAVENOUS | Status: AC
  Administered 2022-02-23: 1000 mL via INTRAVENOUS

## 2022-02-23 MED ORDER — ONDANSETRON HCL 4 MG/2ML IJ SOLN: 4.0000 mg | Freq: Four times a day (QID) | INTRAMUSCULAR | Status: DC | PRN

## 2022-02-23 MED ORDER — IPRATROPIUM-ALBUTEROL 0.5-2.5 (3) MG/3ML IN SOLN
3.0000 mL | Freq: Once | RESPIRATORY_TRACT | Status: AC
  Administered 2022-02-23: 3 mL via RESPIRATORY_TRACT
  Filled 2022-02-23: qty 3

## 2022-02-23 MED ORDER — SODIUM CHLORIDE 0.9 % IV SOLN
2.0000 g | Freq: Three times a day (TID) | INTRAVENOUS | Status: AC
  Administered 2022-02-23 – 2022-03-02 (×20): 2 g via INTRAVENOUS
  Filled 2022-02-23 (×20): qty 12.5

## 2022-02-23 MED ORDER — HYDROCODONE-ACETAMINOPHEN 5-325 MG PO TABS
1.0000 | ORAL_TABLET | ORAL | Status: DC | PRN
  Filled 2022-02-23: qty 2

## 2022-02-23 MED ORDER — VANCOMYCIN HCL IN DEXTROSE 1-5 GM/200ML-% IV SOLN: 1000.0000 mg | INTRAVENOUS | Status: DC

## 2022-02-23 MED ORDER — SODIUM CHLORIDE 0.9 % IV SOLN
2.0000 g | Freq: Once | INTRAVENOUS | Status: AC
  Administered 2022-02-23: 2 g via INTRAVENOUS
  Filled 2022-02-23: qty 12.5

## 2022-02-23 MED ORDER — ACETAMINOPHEN 325 MG PO TABS
650.0000 mg | ORAL_TABLET | Freq: Four times a day (QID) | ORAL | Status: DC | PRN
  Administered 2022-02-24 – 2022-03-02 (×2): 650 mg via ORAL
  Filled 2022-02-23 (×2): qty 2

## 2022-02-23 MED ORDER — ORAL CARE MOUTH RINSE: 15.0000 mL | OROMUCOSAL | Status: DC | PRN

## 2022-02-23 MED ORDER — VANCOMYCIN HCL IN DEXTROSE 1-5 GM/200ML-% IV SOLN: 1000.0000 mg | Freq: Once | INTRAVENOUS | Status: DC

## 2022-02-23 MED ORDER — METRONIDAZOLE 500 MG/100ML IV SOLN
500.0000 mg | Freq: Two times a day (BID) | INTRAVENOUS | Status: DC
  Administered 2022-02-23 – 2022-03-02 (×14): 500 mg via INTRAVENOUS
  Filled 2022-02-23 (×14): qty 100

## 2022-02-23 NOTE — ED Provider Notes (Signed)
[name] DEPT Provider Note   CSN: [id number] Arrival date & time: 02/23/22  1045     History  Chief Complaint  Patient presents with   Hypotension    Nicholas [name] is a 76 y.o. male.  HPI Patient has history of Parkinson's disease.  He is predominantly wheelchair-bound and uses transfer assist to shower.  He got into his shower bench today and was too weak to get back up and transfer back to the wheelchair.  EMS was called.  Patient was found to be febrile and hypotensive.  Patient denies any focus of pain or known or suspected infection.  He denies chest pain or abdominal pain.  He denies he is felt increased shortness of breath from baseline.  First blood pressure by EMS was 90 systolic with heart rate of 120.  Patient was given a 500 cc bolus of fluids and blood pressure improved to 140s but then back down to 90 systolic.  Patient reports he has lymphedema and pain in his feet chronically.    Home Medications Prior to Admission medications   Medication Sig Start Date End Date Taking? Authorizing Provider  carbidopa-levodopa (SINEMET IR) 25-100 MG tablet TAKE 2 TABLETS AT 7AM,11AM, 3PM, AND AT 7PM. 12/05/21   [name], [name], DO  albuterol (VENTOLIN HFA) 108 (90 Base) MCG/ACT inhaler Inhale 2 puffs into the lungs every 6 (six) hours as needed for wheezing or shortness of breath. 02/13/22   [name], MD  [facility]) OINT Apply topically. 04/01/21   [provider]  rosuvastatin (CRESTOR) 10 MG tablet TAKE ONE TABLET BY MOUTH DAILY 12/14/21   [name], MD  Vibegron (GEMTESA PO) Take by mouth. Through urology    [provider]      Allergies    Amoxicillin    Review of Systems   Review of Systems 10 Systems reviewed negative except as per HPI Physical Exam Updated Vital Signs BP 126/76   Pulse 95   Temp (!) 101.4 F (38.6 C) (Rectal)   Resp 18   Ht '5\' 7"'$  (1.702 m)   Wt 99.8 kg   SpO2  96%   BMI 34.46 kg/m  Physical Exam Constitutional:      Comments: Patient is physically deconditioned.  He is supine in the bed.  No respiratory distress at rest.  HENT:     Head: Normocephalic and atraumatic.     Mouth/Throat:     Pharynx: Oropharynx is clear.  Eyes:     Extraocular Movements: Extraocular movements intact.  Cardiovascular:     Comments: Borderline tachycardia.  Distant heart sounds.  No gross rub murmur gallop Pulmonary:     Comments: Anterior auscultation, breath sounds diminished on the right.  Left breath sounds grossly clear.  Patient has some upper airway forced expiratory wheeze with breathing.  He is examined in completely supine position his labs are being drawn.  He does have central obesity.  Abdominal:     Comments: Abdomen is completely soft.  Patient does not Dors any pain or discomfort to deep palpation.  Genitourinary:    Comments: Penis and genital region normal in appearance.  No intertriginous rashes or cellulitic changes or redness or swelling of scrotum or groin region. Musculoskeletal:     Comments: Patient has about 2+ edema appearing chronic of both lower extremities.  Lymphedema and more chronic appearing skin changes of diffuse ready erythema and irregular skin.  See attached images.  There are  no open wounds on the heels or posterior legs.  Skin:    General: Skin is warm and dry.  Neurological:     Comments: Patient is awake and he is alert.  He does answer questions.  He is slow to form his responses but responses are situationally appropriate.  He is physically very deconditioned and has neurologic dysfunction presumably from Parkinson's.     ED Results / Procedures / Treatments   Labs (all labs ordered are listed, but only abnormal results are displayed) Labs Reviewed  COMPREHENSIVE METABOLIC PANEL - Abnormal; Notable for the following components:      Result Value   CO2 20 (*)    Glucose, Bld 127 (*)    BUN 33 (*)    Calcium 8.5  (*)    AST 13 (*)    All other components within normal limits  LACTIC ACID, PLASMA - Abnormal; Notable for the following components:   Lactic Acid, Venous 2.5 (*)    All other components within normal limits  CBC WITH DIFFERENTIAL/PLATELET - Abnormal; Notable for the following components:   WBC 24.3 (*)    Neutro Abs 22.3 (*)    Lymphs Abs 0.6 (*)    Monocytes Absolute 1.2 (*)    Abs Immature Granulocytes 0.25 (*)    All other components within normal limits  PROTIME-INR - Abnormal; Notable for the following components:   Prothrombin Time 15.7 (*)    INR 1.3 (*)    All other components within normal limits  CULTURE, BLOOD (ROUTINE X 2)  CULTURE, BLOOD (ROUTINE X 2)  RESP PANEL BY RT-PCR (FLU A&B, COVID) ARPGX2  LACTIC ACID, PLASMA  URINALYSIS, ROUTINE W REFLEX MICROSCOPIC    EKG EKG Interpretation  Date/Time:  Thursday February 23 2022 10:58:02 EDT Ventricular Rate:  97 PR Interval:  160 QRS Duration: 104 QT Interval:  337 QTC Calculation: 428 R Axis:   73 Text Interpretation: Sinus rhythm Borderline T abnormalities, anterior leads no sig change from previous but rate better controlled Confirmed by Charlesetta Shanks [phone]) on 02/23/2022 12:53:21 PM  Radiology DG Chest Portable 1 View  Result Date: 02/23/2022 CLINICAL DATA:  Wheezing EXAM: PORTABLE CHEST 1 VIEW COMPARISON:  11/04/2020 FINDINGS: Cardiomegaly. Low volume examination. Both lungs are clear. The visualized skeletal structures are unremarkable. IMPRESSION: Cardiomegaly without acute abnormality of the lungs in low volume AP portable projection. Electronically Signed   By: Delanna Ahmadi M.D.   On: 02/23/2022 11:34    Procedures Procedures   CRITICAL CARE Performed by: Charlesetta Shanks   Total critical care time: 30 minutes  Critical care time was exclusive of separately billable procedures and treating other patients.  Critical care was necessary to treat or prevent imminent or life-threatening  deterioration.  Critical care was time spent personally by me on the following activities: development of treatment plan with patient and/or surrogate as well as nursing, discussions with consultants, evaluation of patient's response to treatment, examination of patient, obtaining history from patient or surrogate, ordering and performing treatments and interventions, ordering and review of laboratory studies, ordering and review of radiographic studies, pulse oximetry and re-evaluation of patient's condition.  Medications Ordered in ED Medications  lactated ringers infusion ( Intravenous New Bag/Given 02/23/22 1207)  metroNIDAZOLE (FLAGYL) IVPB 500 mg (500 mg Intravenous New Bag/Given 02/23/22 1205)  vancomycin (VANCOREADY) IVPB 2000 mg/400 mL (has no administration in time range)  lactated ringers bolus 1,000 mL (1,000 mLs Intravenous New Bag/Given 02/23/22 1151)    And  lactated  ringers bolus 1,000 mL (1,000 mLs Intravenous New Bag/Given 02/23/22 1152)    And  lactated ringers bolus 1,000 mL (1,000 mLs Intravenous New Bag/Given 02/23/22 1152)  ceFEPIme (MAXIPIME) 2 g in sodium chloride 0.9 % 100 mL IVPB (2 g Intravenous New Bag/Given 02/23/22 1152)  acetaminophen (TYLENOL) tablet 1,000 mg (1,000 mg Oral Given 02/23/22 1152)  ipratropium-albuterol (DUONEB) 0.5-2.5 (3) MG/3ML nebulizer solution 3 mL (3 mLs Nebulization Given 02/23/22 1152)    ED Course/ Medical Decision Making/ A&P                           Medical Decision Making Amount and/or Complexity of Data Reviewed Labs: ordered. Radiology: ordered.  Risk OTC drugs. Prescription drug management. Decision regarding hospitalization.   Patient presents with fever, low blood pressure and tachycardia.  Patient has comorbid conditions for sepsis.  Patient has poor baseline mobility and Parkinson's disease.  Cognitively patient is alert and interactive with good cognitive function.  He has significant physical compromise for mobility and chronic  lymphedema and redness of the feet.  He does not identify any specific symptoms recently from baseline.  At this time he presents with sepsis criteria will initiate broad-spectrum evaluation for source, IV fluid resuscitation with 30 cc/kg, Tylenol and broad-spectrum antibiotic with cefepime, Vancomycin and Flagyl.   Patient has had positive response to fluid bolus.  Blood pressures are normotensive at this time.  Heart rate is trending down to the 90s.  Patient's mental status remains good.  Patient had some wheezing on arrival but chest x-ray does not show focal consolidation.  DuoNeb ordered for the patient.  At baseline he does use albuterol as needed.  Review of diagnostic results show significant leukocytosis.  Urinalysis is still pending.  I have checked on the patient he has gotten fluids but has not urinated yet.  GFR is normal.  No significant electrolyte derangement.  COVID testing negative.  Lactic acid mildly elevated 2.5.  Patient is abdomen is nontender to deep palpation, I have low suspicion of intra-abdominal source at this time.  Currently not proceeding with empiric CT scanning.  Will await urinalysis as a probable source of infection.  Patient remained stable.  12: 45 reviewed with [facility] hospitalist Dr. Marylyn Ishihara for admission.        Final Clinical Impression(s) / ED Diagnoses Final diagnoses:  Sepsis, due to unspecified organism, unspecified whether acute organ dysfunction present [facility])  Severe comorbid illness    Rx / DC Orders ED Discharge Orders     None         Charlesetta Shanks, MD 02/23/22 1258

## 2022-02-23 NOTE — ED Triage Notes (Signed)
Took a shower and afterwards didn't have strength to get off bench.  Initially 90 SBP HR 120s.  Got him to bed pressure improved.  NS 500cc  20g RH.  BP initially rose to 140's and now is 90 SBP.  Only complaints is feet pain

## 2022-02-23 NOTE — Progress Notes (Signed)
PHARMACY -  BRIEF ANTIBIOTIC NOTE   Pharmacy has received consult(s) for vancomycin and cefepime from an ED provider.  The patient's profile has been reviewed for ht/wt/allergies/indication/available labs.    One time order(s) placed for vancomycin 2 g + cefepime 2 g  Further antibiotics/pharmacy consults should be ordered by admitting physician if indicated.                       Thank you,  Tawnya Crook, PharmD, BCPS Clinical Pharmacist 02/23/2022 11:28 AM

## 2022-02-23 NOTE — Sepsis Progress Note (Signed)
Notified bedside nurse of need to draw repeat lactic acid. 

## 2022-02-23 NOTE — Sepsis Progress Note (Signed)
eLink monitoring code sepsis.  

## 2022-02-23 NOTE — Progress Notes (Signed)
Pt stated he has not worn a cpap at home in two years and does not want to wear one tonight.  Pt encouraged to call / let his nurse know should he change his mind.

## 2022-02-23 NOTE — H&P (Signed)
History and Physical    Patient: Nicholas [name] VQM:[id number] DOB: Jan 10, 1946 DOA: 02/23/2022 DOS: the patient was seen and examined on 02/23/2022 PCP: [name], MD  Patient coming from: Home  Chief Complaint:  Generalized weakness  HPI: Nicholas [name] is a 76 y.o. male with medical history significant of HTN, HLD, Parkinson's Plus syndrome, pre-diabetes. Presenting with weakness. History is from wife at bedside. She reports that the patient was in his normal state of health until last night. She noticed that he was wheezy and appeared weak. He was having difficulty steadying himself in a shower bench and was unable to get out. She called EMS. They assisted the patient out and helped him to bed. This morning, she noticed that her husband was even more weak. He was having difficulty moving from bed and he generally seemed out of it. She called EMS. When they arrived it was noted that he was hypotensive and tachy. So he was brought to the ED for evaluation. He denies any N/V/D or sick contacts. He had subjective fever at home this morning. He denies any other aggravating or alleviating factors.    Review of Systems: As mentioned in the history of present illness. All other systems reviewed and are negative. Past Medical History:  Diagnosis Date   Cancer [facility]) Jan 2008   skin; hx of melanoma left foot/ amputation of toes 1&2    Hyperlipidemia    Hypertension    Lymphedema    Past Surgical History:  Procedure Laterality Date   4th toe- 2nd primary melanoma  2009   removal of melanoma of left foot/amputation of toes 1&25 Jul 2006   WRIST FRACTURE SURGERY     76 years old-set   Social History:  reports that he quit smoking about 28 years ago. His smoking use included cigarettes. He has a 16.00 pack-year smoking history. He has never used smokeless tobacco. He reports that he does not drink alcohol and does not use drugs.  Allergies  Allergen Reactions   Amoxicillin Swelling     Presumed angioedema of lips due to prednisone    Family History  Problem Relation Age of Onset   Hypertension Father    CVA Father        age 25   Hyperlipidemia Father    Other Mother        Deceased   Healthy Sister    Healthy Brother     Prior to Admission medications   Medication Sig Start Date End Date Taking? Authorizing Provider  carbidopa-levodopa (SINEMET IR) 25-100 MG tablet TAKE 2 TABLETS AT 7AM,11AM, 3PM, AND AT 7PM. 12/05/21   [name], [name], DO  albuterol (VENTOLIN HFA) 108 (90 Base) MCG/ACT inhaler Inhale 2 puffs into the lungs every 6 (six) hours as needed for wheezing or shortness of breath. 02/13/22   [name], MD  [facility]) OINT Apply topically. 04/01/21   [provider]  rosuvastatin (CRESTOR) 10 MG tablet TAKE ONE TABLET BY MOUTH DAILY 12/14/21   [name], MD  Vibegron (GEMTESA PO) Take by mouth. Through urology    [provider]    Physical Exam: Vitals:   02/23/22 1120 02/23/22 1130 02/23/22 1200 02/23/22 1230  BP: 115/69 105/68 121/76 126/76  Pulse: 93 87 95 95  Resp: 14 16 (!) 26 18  Temp:      TempSrc:      SpO2: 96% 96% 96% 96%  Weight:  Height:       General: 76 y.o. male resting in bed in NAD Eyes: PERRL, normal sclera ENMT: Nares patent w/o discharge, orophaynx clear, dentition normal, ears w/o discharge/lesions/ulcers Neck: Supple, trachea midline Cardiovascular: RRR, +S1, S2, no m/g/r, equal pulses throughout Respiratory: upper airway transmission, normal WOB GI: BS+, NDNT, no masses noted, no organomegaly noted MSK: No c/c; chronic BLE edema 2-3+, amputation of left 2nd/3rd toes Skin: No rashes, bruises, ulcerations noted Neuro: A&O x 3, general overall weakness w/o focality, no cogwheel rigidity noted, he is slow and soft on responses Psyc: Appropriate interaction but flat affect, calm/cooperative  Data Reviewed:  Lab Results  Component Value Date   NA 137 02/23/2022   K  4.2 02/23/2022   CO2 20 (L) 02/23/2022   GLUCOSE 127 (H) 02/23/2022   BUN 33 (H) 02/23/2022   CREATININE 1.16 02/23/2022   CALCIUM 8.5 (L) 02/23/2022   GFRNONAA >60 02/23/2022   Lab Results  Component Value Date   WBC 24.3 (H) 02/23/2022   HGB 14.6 02/23/2022   HCT 44.9 02/23/2022   MCV 91.3 02/23/2022   PLT 194 02/23/2022   Lactic acid: 2.5 COVID/flu negative  CXR: Cardiomegaly without acute abnormality of the lungs in low volume AP portable projection.  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     - wife reports that he legs -- from an edema standpoint -- look very good; she notes that his erythema comes and goes; not sure that this is a cellulitis component, but the above abx would be covering it  HTN     - normotensive now; BP soft at presentation     - hold BP meds for now; reassess in AM to resume  HLD     - continue home regimen when confirmed  OSA     - CPAP qHS  BPH     - continue home regimen when confirmed  Hyperglycemia Prediabetes     - last A1c 5.9 (5.11.23); follow up outpt  Advance Care Planning:   Code Status: DNR confirmed with wife at bedside  Consults: None  Family Communication: w/ wife at bedside  Severity of Illness: The appropriate patient status for this patient is INPATIENT. Inpatient status is judged to be reasonable and necessary in order to provide the required intensity of service to ensure the patient's safety. The patient's presenting symptoms, physical exam findings, and initial radiographic and laboratory data in the context of their  chronic comorbidities is felt to place them at high risk for further clinical deterioration. Furthermore, it is not anticipated that the patient will be medically stable for discharge from the hospital within 2 midnights of admission.   * I certify that at the point of admission it is my clinical judgment that the patient will require inpatient hospital care spanning beyond 2 midnights from the point of admission due to high intensity of service, high risk for further deterioration and high frequency  of surveillance required.*  Author: Jonnie Finner, DO 02/23/2022 12:54 PM  For on call review [email or web address].

## 2022-02-23 NOTE — Progress Notes (Signed)
Pharmacy Antibiotic Note  Nicholas [name] is a 76 y.o. male admitted on 02/23/2022 with sepsis from unknown source. Patient with chronic lymphedema. Pharmacy has been consulted for vancomycin and cefepime dosing.  Plan: -Vancomycin 2 g IV then 1 g IV q24h -Cefepime 2 g IV q8h -Continue to follow renal function, cultures and clinical progress for antibiotic dose adjustments or de-escalation as indicated  Height: '5\' 7"'$  (902.4 cm) Weight: 99.8 kg (220 lb) IBW/kg (Calculated) : 66.1  Temp (24hrs), Avg:100.3 F (37.9 C), Min:99.1 F (37.3 C), Max:101.4 F (38.6 C)  Recent Labs  Lab 02/23/22 1105  WBC 24.3*  CREATININE 1.16  LATICACIDVEN 2.5*    Estimated Creatinine Clearance: 61.9 mL/min (by C-G formula based on SCr of 1.16 mg/dL).    Allergies  Allergen Reactions   Amoxicillin Swelling    Presumed angioedema of lips due to prednisone    Antimicrobials this admission: Cefepime 8/3 >> Vancomycin 8/3 >> Metronidazole 8/3 x 1  Dose adjustments this admission: NA  Microbiology results: 8/3 BCx: pending   Thank you for allowing pharmacy to be a part of this patient's care.  Tawnya Crook, PharmD, BCPS Clinical Pharmacist 02/23/2022 1:43 PM

## 2022-02-24 DIAGNOSIS — I10 Essential (primary) hypertension: Secondary | ICD-10-CM | POA: Diagnosis not present

## 2022-02-24 DIAGNOSIS — L89302 Pressure ulcer of unspecified buttock, stage 2: Secondary | ICD-10-CM | POA: Diagnosis present

## 2022-02-24 DIAGNOSIS — N401 Enlarged prostate with lower urinary tract symptoms: Secondary | ICD-10-CM | POA: Diagnosis not present

## 2022-02-24 DIAGNOSIS — Z85820 Personal history of malignant melanoma of skin: Secondary | ICD-10-CM

## 2022-02-24 DIAGNOSIS — G4733 Obstructive sleep apnea (adult) (pediatric): Secondary | ICD-10-CM

## 2022-02-24 DIAGNOSIS — R6520 Severe sepsis without septic shock: Secondary | ICD-10-CM | POA: Diagnosis present

## 2022-02-24 DIAGNOSIS — A419 Sepsis, unspecified organism: Secondary | ICD-10-CM | POA: Diagnosis not present

## 2022-02-24 DIAGNOSIS — E669 Obesity, unspecified: Secondary | ICD-10-CM

## 2022-02-24 DIAGNOSIS — G20 Parkinson's disease: Secondary | ICD-10-CM

## 2022-02-24 DIAGNOSIS — R7303 Prediabetes: Secondary | ICD-10-CM

## 2022-02-24 DIAGNOSIS — E7800 Pure hypercholesterolemia, unspecified: Secondary | ICD-10-CM

## 2022-02-24 DIAGNOSIS — I890 Lymphedema, not elsewhere classified: Secondary | ICD-10-CM

## 2022-02-24 DIAGNOSIS — R351 Nocturia: Secondary | ICD-10-CM

## 2022-02-24 LAB — CBC
HCT: 39.1 % (ref 39.0–52.0)
Hemoglobin: 12.4 g/dL — ABNORMAL LOW (ref 13.0–17.0)
MCH: 29.3 pg (ref 26.0–34.0)
MCHC: 31.7 g/dL (ref 30.0–36.0)
MCV: 92.4 fL (ref 80.0–100.0)
Platelets: 140 10*3/uL — ABNORMAL LOW (ref 150–400)
RBC: 4.23 MIL/uL (ref 4.22–5.81)
RDW: 15.4 % (ref 11.5–15.5)
WBC: 12.5 10*3/uL — ABNORMAL HIGH (ref 4.0–10.5)
nRBC: 0 % (ref 0.0–0.2)

## 2022-02-24 LAB — COMPREHENSIVE METABOLIC PANEL
ALT: 5 U/L (ref 0–44)
AST: 15 U/L (ref 15–41)
Albumin: 3.2 g/dL — ABNORMAL LOW (ref 3.5–5.0)
Alkaline Phosphatase: 47 U/L (ref 38–126)
Anion gap: 6 (ref 5–15)
BUN: 23 mg/dL (ref 8–23)
CO2: 25 mmol/L (ref 22–32)
Calcium: 8.1 mg/dL — ABNORMAL LOW (ref 8.9–10.3)
Chloride: 107 mmol/L (ref 98–111)
Creatinine, Ser: 0.67 mg/dL (ref 0.61–1.24)
GFR, Estimated: >60 mL/min (ref >60)
Glucose, Bld: 94 mg/dL (ref 70–99)
Potassium: 3.9 mmol/L (ref 3.5–5.1)
Sodium: 138 mmol/L (ref 135–145)
Total Bilirubin: 0.7 mg/dL (ref 0.3–1.2)
Total Protein: 5.6 g/dL — ABNORMAL LOW (ref 6.5–8.1)

## 2022-02-24 LAB — PROCALCITONIN
Procalcitonin: 1.3 ng/mL
Procalcitonin: 0.8 ng/mL

## 2022-02-24 LAB — PROTIME-INR
INR: 1.3 — ABNORMAL HIGH (ref 0.8–1.2)
Prothrombin Time: 15.7 seconds — ABNORMAL HIGH (ref 11.4–15.2)

## 2022-02-24 LAB — CORTISOL-AM, BLOOD: Cortisol - AM: 13.2 ug/dL (ref 6.7–22.6)

## 2022-02-24 MED ORDER — VANCOMYCIN HCL 1250 MG/250ML IV SOLN
1250.0000 mg | INTRAVENOUS | Status: DC
  Administered 2022-02-24 – 2022-02-26 (×3): 1250 mg via INTRAVENOUS
  Filled 2022-02-24 (×4): qty 250

## 2022-02-24 MED ORDER — MIRABEGRON ER 25 MG PO TB24
25.0000 mg | ORAL_TABLET | Freq: Every day | ORAL | Status: DC
  Administered 2022-02-24 – 2022-03-01 (×6): 25 mg via ORAL
  Filled 2022-02-24 (×7): qty 1

## 2022-02-24 NOTE — Consult Note (Signed)
[place] Nurse Consult Note: Patient receiving care in [facility] ICU 1241. Primary RN, Merry Proud assisting with turning patient. Reason for Consult: Patient admitted to floor, DTI charted on admission, transferred to ICU/SD-apears to be "purple butt"? Wound type: blanchable purple hued discoloration across bilateral buttocks. Consistent with Chronic Tissue Injury, not DTPI. Pressure Injury POA: Yes/No/NA Measurement: na Wound bed: as described Drainage (amount, consistency, odor) none Periwound: intact Dressing procedure/placement/frequency: Sacral foam dressing in use, can continue as prophylactic measure. Off loading to buttocks by turning and repositioning very important to prevent PI development.  [place] nurse will not follow at this time.  Please re-consult the [place] team if needed.  Val Riles, RN, MSN, CWOCN, CNS-BC, pager [phone]

## 2022-02-24 NOTE — Progress Notes (Signed)
PROGRESS NOTE    Nicholas [name]  PNT:[id number] DOB: 07-20-1946 DOA: 02/23/2022 PCP: [name], MD     Brief Narrative:  Nicholas [name] is a 76 y.o. WM PMHx  HTN, HLD, Parkinson's Plus syndrome, pre-diabetes.   Presenting with weakness. History is from wife at bedside. She reports that the patient was in his normal state of health until last night. She noticed that he was wheezy and appeared weak. He was having difficulty steadying himself in a shower bench and was unable to get out. She called EMS. They assisted the patient out and helped him to bed. This morning, she noticed that her husband was even more weak. He was having difficulty moving from bed and he generally seemed out of it. She called EMS. When they arrived it was noted that he was hypotensive and tachy. So he was brought to the ED for evaluation. He denies any N/V/D or sick contacts. He had subjective fever at home this morning. He denies any other aggravating or alleviating factors.     Subjective: Tmax overnight 38.6 C.  A/O x4, patient states negative LOC however was just unable to ambulate without help.  States his bilateral lower extremities constantly swollen (baseline).  Currently feels improved   Assessment & Plan: Covid vaccination;   Principal Problem:   Sepsis ([place]) Active Problems:   History of malignant melanoma. left leg   Hyperlipidemia   Essential hypertension   Parkinson's plus syndrome (HCC)   OSA (obstructive sleep apnea)   BPH associated with nocturia   Lymphedema   Prediabetes   Severe sepsis (HCC)   Obesity (BMI 35.0-39.9 without comorbidity)   Pressure injury of buttock, stage 2 (HCC)  Severe sepsis -On admission make criteria for SIRS RR> 20, WBC> 12 K, temp> 38 C.  Lactic acid> 2 - Cultures pending - Continue current antibiotics - 8/4 trend lactic acid/procalcitonin  Parkinson's plus syndrome - Sinemet 25-100 mg 2 tablets QID    Chronic lymphedema -Per patient wears  compression stockings at home.  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 -Per EMR wife reports that he legs -- from an edema standpoint -- look very good; she notes that his erythema comes and goes; not sure that this is a cellulitis component, but the above abx would be covering it -8/4 Per patient stable   Essential HTN -Currently normotensive continue to hold BP medication     HLD - Lipid panel pending - Crestor 10 mg daily   OSA -CPAP per respiratory qhs   BPH -Not on any home medication.   Overactive bladder - Gemtesa 75 mg qhs   Hyperglycemia/Prediabetes -5/11 hemoglobin A1c= 5.9: Meets criteria for prediabetes - 8/4 prior to discharge will discuss with patient starting metformin      LEFT buttocks pressure injury Pressure Injury 02/23/22 Buttocks Left Deep Tissue Pressure Injury - Purple or maroon localized area of discolored intact skin or blood-filled blister due to damage of underlying soft tissue from pressure and/or shear. (Active)  02/23/22 1521  Location: Buttocks  Location Orientation: Left  Staging: Deep Tissue Pressure Injury - Purple or maroon localized area of discolored intact skin or blood-filled blister due to damage of underlying soft tissue from pressure and/or shear.  Wound Description (Comments):   Present on Admission: Yes  Dressing Type Foam - Lift dressing to assess  site every shift 02/24/22 0900     Pressure Injury 02/23/22 Buttocks Right Deep Tissue Pressure Injury - Purple or maroon localized area of discolored intact skin or blood-filled blister due to damage of underlying soft tissue from pressure and/or shear. (Active)  02/23/22 1522  Location: Buttocks  Location Orientation: Right  Staging: Deep Tissue Pressure Injury - Purple or maroon localized area of discolored intact skin or blood-filled blister  due to damage of underlying soft tissue from pressure and/or shear.  Wound Description (Comments):   Present on Admission: Yes  Dressing Type Foam - Lift dressing to assess site every shift 02/24/22 0900  -8/4 wound care consult  Obesity (BMI36.46 kg/m.) -     Mobility Assessment (last 72 hours)     Mobility Assessment   No documentation.                  DVT prophylaxis: Lovenox Code Status: DNR Family Communication:  Status is: Inpatient    Dispo: The patient is from: Home              Anticipated d/c is to: Home              Anticipated d/c date is: > 3 days              Patient currently is not medically stable to d/c.      Consultants:    Procedures/Significant Events:    I have personally reviewed and interpreted all radiology studies and my findings are as above.  VENTILATOR SETTINGS:    Cultures   Antimicrobials: Anti-infectives (From admission, onward)    Start     Dose/Rate Route Frequency Ordered Stop   02/24/22 1400  vancomycin (VANCOCIN) IVPB 1000 mg/200 mL premix  Status:  Discontinued        1,000 mg 200 mL/hr over 60 Minutes Intravenous Every 24 hours 02/23/22 1338 02/24/22 1004   02/24/22 1400  vancomycin (VANCOREADY) IVPB 1250 mg/250 mL        1,250 mg 166.7 mL/hr over 90 Minutes Intravenous Every 24 hours 02/24/22 1004 03/02/22 1359   02/23/22 2200  metroNIDAZOLE (FLAGYL) IVPB 500 mg        500 mg 100 mL/hr over 60 Minutes Intravenous Every 12 hours 02/23/22 1524 03/02/22 2159   02/23/22 2000  ceFEPIme (MAXIPIME) 2 g in sodium chloride 0.9 % 100 mL IVPB        2 g 200 mL/hr over 30 Minutes Intravenous Every 8 hours 02/23/22 1338 03/02/22 1359   02/23/22 1130  ceFEPIme (MAXIPIME) 2 g in sodium chloride 0.9 % 100 mL IVPB        2 g 200 mL/hr over 30 Minutes Intravenous  Once 02/23/22 1121 02/23/22 1427   02/23/22 1130  metroNIDAZOLE (FLAGYL) IVPB 500 mg        500 mg 100 mL/hr over 60 Minutes Intravenous  Once 02/23/22  1121 02/23/22 1336   02/23/22 1130  vancomycin (VANCOCIN) IVPB 1000 mg/200 mL premix  Status:  Discontinued        1,000 mg 200 mL/hr over 60 Minutes Intravenous  Once 02/23/22 1121 02/23/22 1127   02/23/22 1130  vancomycin (VANCOREADY) IVPB 2000 mg/400 mL        2,000 mg 200 mL/hr over 120 Minutes Intravenous  Once 02/23/22 1127 02/23/22 1444         Devices    LINES / TUBES:      Continuous Infusions:  ceFEPime (MAXIPIME) IV 200 mL/hr at  02/24/22 1711   metronidazole Stopped (02/24/22 1019)   vancomycin Stopped (02/24/22 1638)     Objective: Vitals:   02/24/22 0700 02/24/22 1100 02/24/22 1225 02/24/22 1450  BP: 121/77  127/66   Pulse: 83  68   Resp: (!) 21  17   Temp:  98.1 F (36.7 C)  98.1 F (36.7 C)  TempSrc:  Oral  Oral  SpO2: 92%  96%   Weight:      Height:        Intake/Output Summary (Last 24 hours) at 02/24/2022 1749 Last data filed at 02/24/2022 1711 Gross per 24 hour  Intake 2599.97 ml  Output 1450 ml  Net 1149.97 ml   Filed Weights   02/23/22 1050 02/23/22 1600  Weight: 99.8 kg 105.6 kg    Examination:  General:/A/O x4, No acute respiratory distress Eyes: negative scleral hemorrhage, negative anisocoria, negative icterus ENT: Negative Runny nose, negative gingival bleeding, Neck:  Negative scars, masses, torticollis, lymphadenopathy, JVD Lungs: Clear to auscultation bilaterally without wheezes or crackles Cardiovascular: Regular rate and rhythm without murmur gallop or rub normal S1 and S2 Abdomen: negative abdominal pain, nondistended, positive soft, bowel sounds, no rebound, no ascites, no appreciable mass Extremities: bilateral lower extremity edema 4+ to mid thigh Skin: Negative rashes, lesions, ulcers Psychiatric:  Negative depression, negative anxiety, negative fatigue, negative mania  Central nervous system:  Cranial nerves II through XII intact, tongue/uvula midline, all extremities muscle strength 5/5, sensation intact throughout,  negative dysarthria, negative expressive aphasia, negative receptive aphasia.  .     Data Reviewed: Care during the described time interval was provided by me .  I have reviewed this patient's available data, including medical history, events of note, physical examination, and all test results as part of my evaluation.  CBC: Recent Labs  Lab 02/23/22 1105 02/24/22 0229  WBC 24.3* 12.5*  NEUTROABS 22.3*  --   HGB 14.6 12.4*  HCT 44.9 39.1  MCV 91.3 92.4  PLT 194 347*   Basic Metabolic Panel: Recent Labs  Lab 02/23/22 1105 02/24/22 0229  NA 137 138  K 4.2 3.9  CL 110 107  CO2 20* 25  GLUCOSE 127* 94  BUN 33* 23  CREATININE 1.16 0.67  CALCIUM 8.5* 8.1*   GFR: Estimated Creatinine Clearance: 92.4 mL/min (by C-G formula based on SCr of 0.67 mg/dL). Liver Function Tests: Recent Labs  Lab 02/23/22 1105 02/24/22 0229  AST 13* 15  ALT 6 5  ALKPHOS 59 47  BILITOT 0.8 0.7  PROT 6.5 5.6*  ALBUMIN 3.8 3.2*   No results for input(s): "LIPASE", "AMYLASE" in the last 168 hours. No results for input(s): "AMMONIA" in the last 168 hours. Coagulation Profile: Recent Labs  Lab 02/23/22 1200 02/24/22 0229  INR 1.3* 1.3*   Cardiac Enzymes: No results for input(s): "CKTOTAL", "CKMB", "CKMBINDEX", "TROPONINI" in the last 168 hours. BNP (last 3 results) No results for input(s): "PROBNP" in the last 8760 hours. HbA1C: No results for input(s): "HGBA1C" in the last 72 hours. CBG: No results for input(s): "GLUCAP" in the last 168 hours. Lipid Profile: No results for input(s): "CHOL", "HDL", "LDLCALC", "TRIG", "CHOLHDL", "LDLDIRECT" in the last 72 hours. Thyroid Function Tests: No results for input(s): "TSH", "T4TOTAL", "FREET4", "T3FREE", "THYROIDAB" in the last 72 hours. Anemia Panel: No results for input(s): "VITAMINB12", "FOLATE", "FERRITIN", "TIBC", "IRON", "RETICCTPCT" in the last 72 hours. Sepsis Labs: Recent Labs  Lab 02/23/22 1105 02/23/22 1334 02/23/22 1543  02/23/22 1841 02/24/22 0229 02/24/22 1349  PROCALCITON  --   --   --   --  1.30 0.80  LATICACIDVEN 2.5* 6.0* 1.5 1.1  --   --     Recent Results (from the past 240 hour(s))  Culture, blood (Routine x 2)     Status: None (Preliminary result)   Collection Time: 02/23/22 11:05 AM   Specimen: BLOOD  Result Value Ref Range Status   Specimen Description   Final    BLOOD BLOOD LEFT WRIST Performed at [facility] [address], [place], [place] [address]    Special Requests   Final    BOTTLES DRAWN AEROBIC AND ANAEROBIC Blood Culture adequate volume Performed at [facility] [address], [place], [place] [address]    Culture   Final    NO GROWTH < 24 HOURS Performed at [facility] [address] [place], [place] [address]    Report Status PENDING  Incomplete  Culture, blood (Routine x 2)     Status: None (Preliminary result)   Collection Time: 02/23/22 11:10 AM   Specimen: BLOOD  Result Value Ref Range Status   Specimen Description   Final    BLOOD BLOOD RIGHT FOREARM Performed at [facility] [address], [place], [place] [address]    Special Requests   Final    BOTTLES DRAWN AEROBIC AND ANAEROBIC Blood Culture results may not be optimal due to an inadequate volume of blood received in culture bottles Performed at [facility] [address] [place], [place] [address]    Culture   Final    NO GROWTH < 24 HOURS Performed at [facility] [address], [place], [place] [address]    Report Status PENDING  Incomplete  Resp Panel by RT-PCR (Flu A&B, Covid) Anterior Nasal Swab     Status: None   Collection Time: 02/23/22 12:08 PM   Specimen: Anterior Nasal Swab  Result Value Ref Range Status   SARS Coronavirus 2 by RT PCR NEGATIVE NEGATIVE Final    Comment: (NOTE) SARS-CoV-2 target nucleic acids are NOT DETECTED.  The SARS-CoV-2 RNA is generally detectable in upper  respiratory specimens during the acute phase of infection. The lowest concentration of SARS-CoV-2 viral copies this assay can detect is 138 copies/mL. A negative result does not preclude SARS-Cov-2 infection and should not be used as the sole basis for treatment or other patient management decisions. A negative result may occur with  improper specimen collection/handling, submission of specimen other than nasopharyngeal swab, presence of viral mutation(s) within the areas targeted by this assay, and inadequate number of viral copies(<138 copies/mL). A negative result must be combined with clinical observations, patient history, and epidemiological information. The expected result is Negative.  Fact Sheet for Patients:  EntrepreneurPulse.com.au  Fact Sheet for Healthcare Providers:  IncredibleEmployment.be  This test is no t yet approved or cleared by the [place] FDA and  has been authorized for detection and/or diagnosis of SARS-CoV-2 by FDA under an Emergency Use Authorization (EUA). This EUA will remain  in effect (meaning this test can be used) for the duration of the COVID-19 declaration under Section 564(b)(1) of the Act, 21 U.S.C.section 360bbb-3(b)(1), unless the authorization is terminated  or revoked sooner.       Influenza A by PCR NEGATIVE NEGATIVE Final   Influenza B by PCR NEGATIVE NEGATIVE Final    Comment: (NOTE) The Xpert Xpress SARS-CoV-2/FLU/RSV plus assay is intended as an aid in the diagnosis of influenza from Nasopharyngeal swab specimens and should not be used  as a sole basis for treatment. Nasal washings and aspirates are unacceptable for Xpert Xpress SARS-CoV-2/FLU/RSV testing.  Fact Sheet for Patients: EntrepreneurPulse.com.au  Fact Sheet for Healthcare Providers: IncredibleEmployment.be  This test is not yet approved or cleared by the [place] FDA and has been  authorized for detection and/or diagnosis of SARS-CoV-2 by FDA under an Emergency Use Authorization (EUA). This EUA will remain in effect (meaning this test can be used) for the duration of the COVID-19 declaration under Section 564(b)(1) of the Act, 21 U.S.C. section 360bbb-3(b)(1), unless the authorization is terminated or revoked.  Performed at [facility], [place] [address] [place], [place] [address]   MRSA Next Gen by PCR, Nasal     Status: None   Collection Time: 02/23/22  4:49 PM   Specimen: Nasal Mucosa; Nasal Swab  Result Value Ref Range Status   MRSA by PCR Next Gen NOT DETECTED NOT DETECTED Final    Comment: (NOTE) The GeneXpert MRSA Assay (FDA approved for NASAL specimens only), is one component of a comprehensive MRSA colonization surveillance program. It is not intended to diagnose MRSA infection nor to guide or monitor treatment for MRSA infections. Test performance is not FDA approved in patients less than 13 years old. Performed at [facility], [place] [address], [place], [place] [address]          Radiology Studies: DG Chest Portable 1 View  Result Date: 02/23/2022 CLINICAL DATA:  Wheezing EXAM: PORTABLE CHEST 1 VIEW COMPARISON:  11/04/2020 FINDINGS: Cardiomegaly. Low volume examination. Both lungs are clear. The visualized skeletal structures are unremarkable. IMPRESSION: Cardiomegaly without acute abnormality of the lungs in low volume AP portable projection. Electronically Signed   By: Delanna Ahmadi M.D.   On: 02/23/2022 11:34        Scheduled Meds:  carbidopa-levodopa  2 tablet Oral QID   Chlorhexidine Gluconate Cloth  6 each Topical Daily   enoxaparin (LOVENOX) injection  40 mg Subcutaneous Q24H   mirabegron ER  25 mg Oral QHS   rosuvastatin  10 mg Oral QHS   Continuous Infusions:  ceFEPime (MAXIPIME) IV 200 mL/hr at 02/24/22 1711   metronidazole Stopped (02/24/22 1019)   vancomycin Stopped (02/24/22 1638)      LOS: 1 day    Time spent:40 min    Yaacov Koziol, Geraldo Docker, MD [facility]   If 7PM-7AM, please contact night-coverage 02/24/2022, 5:49 PM

## 2022-02-24 NOTE — Progress Notes (Signed)
Pt refused cpap again tonight.

## 2022-02-24 NOTE — Progress Notes (Signed)
Pharmacy Antibiotic Note  ITZAEL LIPTAK is a 76 y.o. male admitted on 02/23/2022 with sepsis from unknown source. Patient with chronic lymphedema. Pharmacy has been consulted for vancomycin and cefepime dosing.  Today, 02/24/22 -SCr improved -PCT 1.3 -WBC improving  Plan: -Continue cefepime 2 g IV q8h + metronidazole 500 mg q12 -Increase vancomycin dose to 1250 mg IV q24h for estimated AUC of 442  Goal vancomycin AUC 400-550. Check levels at steady state as needed.  Monitor renal function, culture data.  Height: '5\' 7"'$  (170.2 cm) Weight: 105.6 kg (232 lb 12.9 oz) IBW/kg (Calculated) : 66.1  Temp (24hrs), Avg:99 F (37.2 C), Min:98.3 F (36.8 C), Max:101.4 F (38.6 C)  Recent Labs  Lab 02/23/22 1105 02/23/22 1334 02/23/22 1543 02/23/22 1841 02/24/22 0229  WBC 24.3*  --   --   --  12.5*  CREATININE 1.16  --   --   --  0.67  LATICACIDVEN 2.5* 6.0* 1.5 1.1  --      Estimated Creatinine Clearance: 92.4 mL/min (by C-G formula based on SCr of 0.67 mg/dL).    Allergies  Allergen Reactions   Amoxicillin Swelling    Presumed angioedema of lips due to prednisone    Antimicrobials this admission: Cefepime 8/3 >> Vancomycin 8/3 >> Metronidazole 8/3 >>  Dose adjustments this admission:   Microbiology results: 8/3 BCx: pending 8/3 MRSA PCR: Negative  Lenis Noon, PharmD, BCPS Clinical Pharmacist 02/24/2022 10:04 AM

## 2022-02-25 ENCOUNTER — Inpatient Hospital Stay (HOSPITAL_COMMUNITY): Payer: PPO

## 2022-02-25 DIAGNOSIS — N401 Enlarged prostate with lower urinary tract symptoms: Secondary | ICD-10-CM | POA: Diagnosis not present

## 2022-02-25 DIAGNOSIS — A419 Sepsis, unspecified organism: Secondary | ICD-10-CM | POA: Diagnosis not present

## 2022-02-25 DIAGNOSIS — I10 Essential (primary) hypertension: Secondary | ICD-10-CM | POA: Diagnosis not present

## 2022-02-25 DIAGNOSIS — Z85820 Personal history of malignant melanoma of skin: Secondary | ICD-10-CM | POA: Diagnosis not present

## 2022-02-25 DIAGNOSIS — I509 Heart failure, unspecified: Secondary | ICD-10-CM

## 2022-02-25 LAB — CBC WITH DIFFERENTIAL/PLATELET
Abs Immature Granulocytes: 0.03 10*3/uL (ref 0.00–0.07)
Basophils Absolute: 0 10*3/uL (ref 0.0–0.1)
Basophils Relative: 0 %
Eosinophils Absolute: 0.4 10*3/uL (ref 0.0–0.5)
Eosinophils Relative: 4 %
HCT: 40.2 % (ref 39.0–52.0)
Hemoglobin: 12.7 g/dL — ABNORMAL LOW (ref 13.0–17.0)
Immature Granulocytes: 0 %
Lymphocytes Relative: 17 %
Lymphs Abs: 1.5 10*3/uL (ref 0.7–4.0)
MCH: 29.3 pg (ref 26.0–34.0)
MCHC: 31.6 g/dL (ref 30.0–36.0)
MCV: 92.8 fL (ref 80.0–100.0)
Monocytes Absolute: 1 10*3/uL (ref 0.1–1.0)
Monocytes Relative: 11 %
Neutro Abs: 6.1 10*3/uL (ref 1.7–7.7)
Neutrophils Relative %: 68 %
Platelets: 142 10*3/uL — ABNORMAL LOW (ref 150–400)
RBC: 4.33 MIL/uL (ref 4.22–5.81)
RDW: 14.9 % (ref 11.5–15.5)
WBC: 8.9 10*3/uL (ref 4.0–10.5)
nRBC: 0 % (ref 0.0–0.2)

## 2022-02-25 LAB — GLUCOSE, CAPILLARY
Glucose-Capillary: 102 mg/dL — ABNORMAL HIGH (ref 70–99)
Glucose-Capillary: 117 mg/dL — ABNORMAL HIGH (ref 70–99)
Glucose-Capillary: 113 mg/dL — ABNORMAL HIGH (ref 70–99)

## 2022-02-25 LAB — COMPREHENSIVE METABOLIC PANEL
ALT: 7 U/L (ref 0–44)
AST: 14 U/L — ABNORMAL LOW (ref 15–41)
Albumin: 3.3 g/dL — ABNORMAL LOW (ref 3.5–5.0)
Alkaline Phosphatase: 49 U/L (ref 38–126)
Anion gap: 8 (ref 5–15)
BUN: 18 mg/dL (ref 8–23)
CO2: 24 mmol/L (ref 22–32)
Calcium: 8.6 mg/dL — ABNORMAL LOW (ref 8.9–10.3)
Chloride: 107 mmol/L (ref 98–111)
Creatinine, Ser: 0.73 mg/dL (ref 0.61–1.24)
GFR, Estimated: >60 mL/min (ref >60)
Glucose, Bld: 92 mg/dL (ref 70–99)
Potassium: 4 mmol/L (ref 3.5–5.1)
Sodium: 139 mmol/L (ref 135–145)
Total Bilirubin: 0.6 mg/dL (ref 0.3–1.2)
Total Protein: 6 g/dL — ABNORMAL LOW (ref 6.5–8.1)

## 2022-02-25 LAB — ECHOCARDIOGRAM COMPLETE
AR max vel: 2.55 cm2
AV Peak grad: 6.3 mmHg
Ao pk vel: 1.26 m/s
Area-P 1/2: 4.39 cm2
Height: 67 in
S' Lateral: 2.2 cm
Weight: 3724.89 oz

## 2022-02-25 LAB — PHOSPHORUS: Phosphorus: 2.5 mg/dL (ref 2.5–4.6)

## 2022-02-25 LAB — PROCALCITONIN: Procalcitonin: 0.66 ng/mL

## 2022-02-25 LAB — MAGNESIUM: Magnesium: 2.1 mg/dL (ref 1.7–2.4)

## 2022-02-25 MED ORDER — LISINOPRIL 5 MG PO TABS
2.5000 mg | ORAL_TABLET | Freq: Every day | ORAL | Status: DC
  Administered 2022-02-25 – 2022-03-02 (×6): 2.5 mg via ORAL
  Filled 2022-02-25 (×6): qty 1

## 2022-02-25 MED ORDER — FUROSEMIDE 10 MG/ML IJ SOLN
80.0000 mg | Freq: Once | INTRAMUSCULAR | Status: AC
  Administered 2022-02-25: 80 mg via INTRAVENOUS
  Filled 2022-02-25: qty 8

## 2022-02-25 MED ORDER — INSULIN ASPART 100 UNIT/ML IJ SOLN: 0.0000 [IU] | INTRAMUSCULAR | Status: DC

## 2022-02-25 NOTE — Progress Notes (Signed)
Refused cpap tonight.

## 2022-02-25 NOTE — Progress Notes (Signed)
PROGRESS NOTE    Nicholas [name]  VXB:[id number] DOB: 02/26/1946 DOA: 02/23/2022 PCP: [name], MD     Brief Narrative:  Nicholas [name] is a 76 y.o. WM PMHx  HTN, HLD, Parkinson's Plus syndrome, pre-diabetes.   Presenting with weakness. History is from wife at bedside. She reports that the patient was in his normal state of health until last night. She noticed that he was wheezy and appeared weak. He was having difficulty steadying himself in a shower bench and was unable to get out. She called EMS. They assisted the patient out and helped him to bed. This morning, she noticed that her husband was even more weak. He was having difficulty moving from bed and he generally seemed out of it. She called EMS. When they arrived it was noted that he was hypotensive and tachy. So he was brought to the ED for evaluation. He denies any N/V/D or sick contacts. He had subjective fever at home this morning. He denies any other aggravating or alleviating factors.     Subjective: 8/5 afebrile overnight.  Patient again refused CPAP.  Patient states he had been on CPAP at home but has not used in 2 years secondary to sleeping in a chair.   Assessment & Plan: Covid vaccination;   Principal Problem:   Sepsis ([place]) Active Problems:   History of malignant melanoma. left leg   Hyperlipidemia   Essential hypertension   Parkinson's plus syndrome (HCC)   OSA (obstructive sleep apnea)   BPH associated with nocturia   Lymphedema   Prediabetes   Severe sepsis (HCC)   Obesity (BMI 35.0-39.9 without comorbidity)   Pressure injury of buttock, stage 2 (HCC)  Severe sepsis -On admission make criteria for SIRS RR> 20, WBC> 12 K, temp> 38 C.  Lactic acid> 2 - Cultures pending - Continue current antibiotics - 8/4 trend lactic acid/procalcitonin  Latest Reference Range & Units 02/23/22 11:05 02/23/22 13:34 02/23/22 15:43 02/23/22 18:41  Lactic Acid, Venous 0.5 - 1.9 mmol/L 2.5 (HH) 6.0 (HH) 1.5 1.1   ([place]): Data is critically high  Latest Reference Range & Units 02/24/22 02:29 02/24/22 13:49 02/25/22 03:02  Procalcitonin ng/mL 1.30 0.80 0.66    Parkinson's plus syndrome - Sinemet 25-100 mg 2 tablets QID    Chronic lymphedema -Per patient wears compression stockings at home.  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  Patient agreed - 8/5 counseled that even though he is sleeping in a chair now upon discharge should arrange for outpatient sleep study.   BPH -Not on any home medication.   Overactive bladder - Gemtesa 75 mg qhs   Hyperglycemia/Prediabetes -5/11 Hemoglobin A1c= 5.9: Meets criteria for prediabetes - 8/4 prior to discharge  will discuss with patient starting Metformin  -8/5 sensitive SSI  LEFT buttocks pressure injury Pressure Injury 02/23/22 Buttocks Left Deep Tissue Pressure Injury - Purple or maroon localized area of discolored intact skin or blood-filled blister due to damage of underlying soft tissue from pressure and/or shear. (Active)  02/23/22 1521  Location: Buttocks  Location Orientation: Left  Staging: Deep Tissue Pressure Injury - Purple or maroon localized area of discolored intact skin or blood-filled blister due to damage of underlying soft tissue from  pressure and/or shear.  Wound Description (Comments):   Present on Admission: Yes  Dressing Type Foam - Lift dressing to assess site every shift 02/24/22 2000     Pressure Injury 02/23/22 Buttocks Right Deep Tissue Pressure Injury - Purple or maroon localized area of discolored intact skin or blood-filled blister due to damage of underlying soft tissue from pressure and/or shear. (Active)  02/23/22 1522  Location: Buttocks  Location Orientation: Right  Staging: Deep Tissue Pressure Injury - Purple or maroon localized area of discolored intact skin or blood-filled blister due to damage of underlying soft tissue from pressure and/or shear.  Wound Description (Comments):   Present on Admission: Yes  Dressing Type Foam - Lift dressing to assess site every shift 02/24/22 2000  -8/4 wound care consult  Obesity (BMI36.46 kg/m.) -     Mobility Assessment (last 72 hours)     Mobility Assessment   No documentation.                  DVT prophylaxis: Lovenox Code Status: DNR Family Communication:  Status is: Inpatient    Dispo: The patient is from: Home              Anticipated d/c is to: Home              Anticipated d/c date is: > 3 days              Patient currently is not medically stable to d/c.      Consultants:    Procedures/Significant Events:    I have personally reviewed and interpreted all radiology studies and my findings are as above.  VENTILATOR SETTINGS:    Cultures   Antimicrobials: Anti-infectives (From admission, onward)    Start     Ordered Stop   02/24/22 1400  vancomycin (VANCOCIN) IVPB 1000 mg/200 mL premix  Status:  Discontinued        02/23/22 1338 02/24/22 1004   02/24/22 1400  vancomycin (VANCOREADY) IVPB 1250 mg/250 mL        02/24/22 1004 03/02/22 1359   02/23/22 2200  metroNIDAZOLE (FLAGYL) IVPB 500 mg        02/23/22 1524 03/02/22 2159   02/23/22 2000  ceFEPIme (MAXIPIME) 2 g in sodium chloride 0.9 % 100 mL IVPB         02/23/22 1338 03/02/22 1359   02/23/22 1130  ceFEPIme (MAXIPIME) 2 g in sodium chloride 0.9 % 100 mL IVPB        02/23/22 1121 02/23/22 1427   02/23/22 1130  metroNIDAZOLE (FLAGYL) IVPB 500 mg        02/23/22 1121 02/23/22 1336   02/23/22 1130  vancomycin (VANCOCIN) IVPB 1000 mg/200 mL premix  Status:  Discontinued        02/23/22 1121 02/23/22 1127   02/23/22 1130  vancomycin (VANCOREADY) IVPB 2000 mg/400 mL        02/23/22 1127 02/23/22 1444  Devices    LINES / TUBES:      Continuous Infusions:  ceFEPime (MAXIPIME) IV Stopped (02/25/22 0557)   metronidazole Stopped (02/24/22 2327)   vancomycin Stopped (02/24/22 1638)     Objective: Vitals:   02/25/22 0100 02/25/22 0430 02/25/22 0500 02/25/22 0600  BP:  (!) 156/72 (!) 151/79 (!) 184/80  Pulse:  79 72 71  Resp:  16    Temp: 99 F (37.2 C)     TempSrc: Oral     SpO2:  96% 98% 94%  Weight:      Height:        Intake/Output Summary (Last 24 hours) at 02/25/2022 0908 Last data filed at 02/25/2022 0715 Gross per 24 hour  Intake 999.89 ml  Output 3500 ml  Net -2500.11 ml    Filed Weights   02/23/22 1050 02/23/22 1600  Weight: 99.8 kg 105.6 kg    Examination:  General:/A/O x4, No acute respiratory distress Eyes: negative scleral hemorrhage, negative anisocoria, negative icterus ENT: Negative Runny nose, negative gingival bleeding, Neck:  Negative scars, masses, torticollis, lymphadenopathy, JVD Lungs: Clear to auscultation bilaterally without wheezes or crackles Cardiovascular: Regular rate and rhythm without murmur gallop or rub normal S1 and S2 Abdomen: negative abdominal pain, nondistended, positive soft, bowel sounds, no rebound, no ascites, no appreciable mass Extremities: bilateral lower extremity edema 4+ to mid thigh Skin: Negative rashes, lesions, ulcers Psychiatric:  Negative depression, negative anxiety, negative fatigue, negative mania  Central nervous system:  Cranial nerves II through XII  intact, tongue/uvula midline, all extremities muscle strength 5/5, sensation intact throughout, negative dysarthria, negative expressive aphasia, negative receptive aphasia.  .     Data Reviewed: Care during the described time interval was provided by me .  I have reviewed this patient's available data, including medical history, events of note, physical examination, and all test results as part of my evaluation.  CBC: Recent Labs  Lab 02/23/22 1105 02/24/22 0229 02/25/22 0302  WBC 24.3* 12.5* 8.9  NEUTROABS 22.3*  --  6.1  HGB 14.6 12.4* 12.7*  HCT 44.9 39.1 40.2  MCV 91.3 92.4 92.8  PLT 194 140* 142*    Basic Metabolic Panel: Recent Labs  Lab 02/23/22 1105 02/24/22 0229 02/25/22 0302  NA 137 138 139  K 4.2 3.9 4.0  CL 110 107 107  CO2 20* 25 24  GLUCOSE 127* 94 92  BUN 33* 23 18  CREATININE 1.16 0.67 0.73  CALCIUM 8.5* 8.1* 8.6*  MG  --   --  2.1  PHOS  --   --  2.5    GFR: Estimated Creatinine Clearance: 92.4 mL/min (by C-G formula based on SCr of 0.73 mg/dL). Liver Function Tests: Recent Labs  Lab 02/23/22 1105 02/24/22 0229 02/25/22 0302  AST 13* 15 14*  ALT '6 5 7  '$ ALKPHOS 59 47 49  BILITOT 0.8 0.7 0.6  PROT 6.5 5.6* 6.0*  ALBUMIN 3.8 3.2* 3.3*    No results for input(s): "LIPASE", "AMYLASE" in the last 168 hours. No results for input(s): "AMMONIA" in the last 168 hours. Coagulation Profile: Recent Labs  Lab 02/23/22 1200 02/24/22 0229  INR 1.3* 1.3*    Cardiac Enzymes: No results for input(s): "CKTOTAL", "CKMB", "CKMBINDEX", "TROPONINI" in the last 168 hours. BNP (last 3 results) No results for input(s): "PROBNP" in the last 8760 hours. HbA1C: No results for input(s): "HGBA1C" in the last 72 hours. CBG: No results for input(s): "GLUCAP" in the last 168 hours. Lipid Profile: No results for  input(s): "CHOL", "HDL", "LDLCALC", "TRIG", "CHOLHDL", "LDLDIRECT" in the last 72 hours. Thyroid Function Tests: No results for input(s): "TSH",  "T4TOTAL", "FREET4", "T3FREE", "THYROIDAB" in the last 72 hours. Anemia Panel: No results for input(s): "VITAMINB12", "FOLATE", "FERRITIN", "TIBC", "IRON", "RETICCTPCT" in the last 72 hours. Sepsis Labs: Recent Labs  Lab 02/23/22 1105 02/23/22 1334 02/23/22 1543 02/23/22 1841 02/24/22 0229 02/24/22 1349 02/25/22 0302  PROCALCITON  --   --   --   --  1.30 0.80 0.66  LATICACIDVEN 2.5* 6.0* 1.5 1.1  --   --   --      Recent Results (from the past 240 hour(s))  Culture, blood (Routine x 2)     Status: None (Preliminary result)   Collection Time: 02/23/22 11:05 AM   Specimen: BLOOD  Result Value Ref Range Status   Specimen Description   Final    BLOOD BLOOD LEFT WRIST Performed at [facility] [address], [place], [place] [address]    Special Requests   Final    BOTTLES DRAWN AEROBIC AND ANAEROBIC Blood Culture adequate volume Performed at [facility] [address], [place], [place] [address]    Culture   Final    NO GROWTH 2 DAYS Performed at [facility] [address], [place], [place] [address]    Report Status PENDING  Incomplete  Culture, blood (Routine x 2)     Status: None (Preliminary result)   Collection Time: 02/23/22 11:10 AM   Specimen: BLOOD  Result Value Ref Range Status   Specimen Description   Final    BLOOD BLOOD RIGHT FOREARM Performed at [facility] [address] [place], [place] [address]    Special Requests   Final    BOTTLES DRAWN AEROBIC AND ANAEROBIC Blood Culture results may not be optimal due to an inadequate volume of blood received in culture bottles Performed at [facility] [address], [place], [place] [address]    Culture   Final    NO GROWTH 2 DAYS Performed at [facility] [address] [place], [place] [address]    Report Status PENDING  Incomplete  Resp Panel by RT-PCR (Flu A&B, Covid) Anterior Nasal Swab     Status: None    Collection Time: 02/23/22 12:08 PM   Specimen: Anterior Nasal Swab  Result Value Ref Range Status   SARS Coronavirus 2 by RT PCR NEGATIVE NEGATIVE Final    Comment: (NOTE) SARS-CoV-2 target nucleic acids are NOT DETECTED.  The SARS-CoV-2 RNA is generally detectable in upper respiratory specimens during the acute phase of infection. The lowest concentration of SARS-CoV-2 viral copies this assay can detect is 138 copies/mL. A negative result does not preclude SARS-Cov-2 infection and should not be used as the sole basis for treatment or other patient management decisions. A negative result may occur with  improper specimen collection/handling, submission of specimen other than nasopharyngeal swab, presence of viral mutation(s) within the areas targeted by this assay, and inadequate number of viral copies(<138 copies/mL). A negative result must be combined with clinical observations, patient history, and epidemiological information. The expected result is Negative.  Fact Sheet for Patients:  EntrepreneurPulse.com.au  Fact Sheet for Healthcare Providers:  IncredibleEmployment.be  This test is no t yet approved or cleared by the [place] FDA and  has been authorized for detection and/or diagnosis of SARS-CoV-2 by FDA under an Emergency Use Authorization (EUA). This EUA will remain  in  effect (meaning this test can be used) for the duration of the COVID-19 declaration under Section 564(b)(1) of the Act, 21 U.S.C.section 360bbb-3(b)(1), unless the authorization is terminated  or revoked sooner.       Influenza A by PCR NEGATIVE NEGATIVE Final   Influenza B by PCR NEGATIVE NEGATIVE Final    Comment: (NOTE) The Xpert Xpress SARS-CoV-2/FLU/RSV plus assay is intended as an aid in the diagnosis of influenza from Nasopharyngeal swab specimens and should not be used as a sole basis for treatment. Nasal washings and aspirates are unacceptable for  Xpert Xpress SARS-CoV-2/FLU/RSV testing.  Fact Sheet for Patients: EntrepreneurPulse.com.au  Fact Sheet for Healthcare Providers: IncredibleEmployment.be  This test is not yet approved or cleared by the [place] FDA and has been authorized for detection and/or diagnosis of SARS-CoV-2 by FDA under an Emergency Use Authorization (EUA). This EUA will remain in effect (meaning this test can be used) for the duration of the COVID-19 declaration under Section 564(b)(1) of the Act, 21 U.S.C. section 360bbb-3(b)(1), unless the authorization is terminated or revoked.  Performed at [facility], [place] [address] [place], [place] [address]   MRSA Next Gen by PCR, Nasal     Status: None   Collection Time: 02/23/22  4:49 PM   Specimen: Nasal Mucosa; Nasal Swab  Result Value Ref Range Status   MRSA by PCR Next Gen NOT DETECTED NOT DETECTED Final    Comment: (NOTE) The GeneXpert MRSA Assay (FDA approved for NASAL specimens only), is one component of a comprehensive MRSA colonization surveillance program. It is not intended to diagnose MRSA infection nor to guide or monitor treatment for MRSA infections. Test performance is not FDA approved in patients less than 64 years old. Performed at [facility], [place] [address] [place],  [address]          Radiology Studies: DG Chest Portable 1 View  Result Date: 02/23/2022 CLINICAL DATA:  Wheezing EXAM: PORTABLE CHEST 1 VIEW COMPARISON:  11/04/2020 FINDINGS: Cardiomegaly. Low volume examination. Both lungs are clear. The visualized skeletal structures are unremarkable. IMPRESSION: Cardiomegaly without acute abnormality of the lungs in low volume AP portable projection. Electronically Signed   By: Delanna Ahmadi M.D.   On: 02/23/2022 11:34        Scheduled Meds:  carbidopa-levodopa  2 tablet Oral QID   Chlorhexidine Gluconate Cloth  6 each Topical Daily    enoxaparin (LOVENOX) injection  40 mg Subcutaneous Q24H   mirabegron ER  25 mg Oral QHS   rosuvastatin  10 mg Oral QHS   Continuous Infusions:  ceFEPime (MAXIPIME) IV Stopped (02/25/22 0557)   metronidazole Stopped (02/24/22 2327)   vancomycin Stopped (02/24/22 1638)     LOS: 2 days    Time spent:40 min    Ayden Apodaca, Geraldo Docker, MD [facility]   If 7PM-7AM, please contact night-coverage 02/25/2022, 9:08 AM

## 2022-02-26 DIAGNOSIS — I10 Essential (primary) hypertension: Secondary | ICD-10-CM | POA: Diagnosis not present

## 2022-02-26 DIAGNOSIS — I5031 Acute diastolic (congestive) heart failure: Secondary | ICD-10-CM

## 2022-02-26 DIAGNOSIS — N401 Enlarged prostate with lower urinary tract symptoms: Secondary | ICD-10-CM | POA: Diagnosis not present

## 2022-02-26 DIAGNOSIS — Z85820 Personal history of malignant melanoma of skin: Secondary | ICD-10-CM | POA: Diagnosis not present

## 2022-02-26 DIAGNOSIS — A419 Sepsis, unspecified organism: Secondary | ICD-10-CM | POA: Diagnosis not present

## 2022-02-26 LAB — COMPREHENSIVE METABOLIC PANEL
ALT: <5 U/L (ref 0–44)
AST: 12 U/L — ABNORMAL LOW (ref 15–41)
Albumin: 3.5 g/dL (ref 3.5–5.0)
Alkaline Phosphatase: 53 U/L (ref 38–126)
Anion gap: 9 (ref 5–15)
BUN: 22 mg/dL (ref 8–23)
CO2: 25 mmol/L (ref 22–32)
Calcium: 8.9 mg/dL (ref 8.9–10.3)
Chloride: 106 mmol/L (ref 98–111)
Creatinine, Ser: 0.76 mg/dL (ref 0.61–1.24)
GFR, Estimated: >60 mL/min (ref >60)
Glucose, Bld: 98 mg/dL (ref 70–99)
Potassium: 3.6 mmol/L (ref 3.5–5.1)
Sodium: 140 mmol/L (ref 135–145)
Total Bilirubin: 0.6 mg/dL (ref 0.3–1.2)
Total Protein: 6.3 g/dL — ABNORMAL LOW (ref 6.5–8.1)

## 2022-02-26 LAB — GLUCOSE, CAPILLARY
Glucose-Capillary: 107 mg/dL — ABNORMAL HIGH (ref 70–99)
Glucose-Capillary: 111 mg/dL — ABNORMAL HIGH (ref 70–99)
Glucose-Capillary: 124 mg/dL — ABNORMAL HIGH (ref 70–99)
Glucose-Capillary: 132 mg/dL — ABNORMAL HIGH (ref 70–99)
Glucose-Capillary: 93 mg/dL (ref 70–99)
Glucose-Capillary: 98 mg/dL (ref 70–99)
Glucose-Capillary: 98 mg/dL (ref 70–99)

## 2022-02-26 LAB — LIPID PANEL
Cholesterol: 96 mg/dL (ref 0–200)
HDL: 33 mg/dL — ABNORMAL LOW (ref >40)
LDL Cholesterol: 45 mg/dL (ref 0–99)
Total CHOL/HDL Ratio: 2.9 RATIO
Triglycerides: 91 mg/dL (ref <150)
VLDL: 18 mg/dL (ref 0–40)

## 2022-02-26 LAB — CBC WITH DIFFERENTIAL/PLATELET
Abs Immature Granulocytes: 0.03 10*3/uL (ref 0.00–0.07)
Basophils Absolute: 0 10*3/uL (ref 0.0–0.1)
Basophils Relative: 0 %
Eosinophils Absolute: 0.3 10*3/uL (ref 0.0–0.5)
Eosinophils Relative: 5 %
HCT: 42.4 % (ref 39.0–52.0)
Hemoglobin: 13.7 g/dL (ref 13.0–17.0)
Immature Granulocytes: 0 %
Lymphocytes Relative: 19 %
Lymphs Abs: 1.4 10*3/uL (ref 0.7–4.0)
MCH: 29.6 pg (ref 26.0–34.0)
MCHC: 32.3 g/dL (ref 30.0–36.0)
MCV: 91.6 fL (ref 80.0–100.0)
Monocytes Absolute: 0.6 10*3/uL (ref 0.1–1.0)
Monocytes Relative: 9 %
Neutro Abs: 4.7 10*3/uL (ref 1.7–7.7)
Neutrophils Relative %: 67 %
Platelets: 158 10*3/uL (ref 150–400)
RBC: 4.63 MIL/uL (ref 4.22–5.81)
RDW: 14.7 % (ref 11.5–15.5)
WBC: 7.1 10*3/uL (ref 4.0–10.5)
nRBC: 0 % (ref 0.0–0.2)

## 2022-02-26 LAB — URINE CULTURE: Culture: NO GROWTH

## 2022-02-26 LAB — MAGNESIUM: Magnesium: 2.1 mg/dL (ref 1.7–2.4)

## 2022-02-26 LAB — PHOSPHORUS: Phosphorus: 3.6 mg/dL (ref 2.5–4.6)

## 2022-02-26 LAB — PROCALCITONIN: Procalcitonin: 0.38 ng/mL

## 2022-02-26 MED ORDER — FUROSEMIDE 10 MG/ML IJ SOLN
80.0000 mg | Freq: Every day | INTRAMUSCULAR | Status: DC
  Administered 2022-02-27 – 2022-02-28 (×2): 80 mg via INTRAVENOUS
  Filled 2022-02-26 (×2): qty 8

## 2022-02-26 MED ORDER — POLYETHYLENE GLYCOL 3350 17 G PO PACK
17.0000 g | PACK | Freq: Every day | ORAL | Status: DC | PRN
  Administered 2022-02-26: 17 g via ORAL

## 2022-02-26 MED ORDER — INSULIN ASPART 100 UNIT/ML IJ SOLN
0.0000 [IU] | INTRAMUSCULAR | Status: DC
  Administered 2022-02-27: 2 [IU] via SUBCUTANEOUS
  Administered 2022-02-28 – 2022-03-01 (×2): 1 [IU] via SUBCUTANEOUS

## 2022-02-26 MED ORDER — MAGNESIUM HYDROXIDE 400 MG/5ML PO SUSP
400.0000 mL | Freq: Once | ORAL | Status: DC
  Filled 2022-02-26: qty 120

## 2022-02-26 MED ORDER — FUROSEMIDE 10 MG/ML IJ SOLN
80.0000 mg | Freq: Once | INTRAMUSCULAR | Status: AC
Start: 2022-02-26 — End: 2022-02-26
  Administered 2022-02-26: 80 mg via INTRAVENOUS
  Filled 2022-02-26: qty 8

## 2022-02-26 NOTE — Progress Notes (Signed)
RT placed patient on CPAP. Patient wore cpap for about an hour and complained that he could not breath. RN took patient off cpap per RT request.

## 2022-02-26 NOTE — Progress Notes (Signed)
PROGRESS NOTE    Nicholas [name]  IEP:[id number] DOB: 04-28-1946 DOA: 02/23/2022 PCP: [name], MD     Brief Narrative:  Nicholas [name] is a 76 y.o. WM PMHx  HTN, HLD, Parkinson's Plus syndrome, pre-diabetes.   Presenting with weakness. History is from wife at bedside. She reports that the patient was in his normal state of health until last night. She noticed that he was wheezy and appeared weak. He was having difficulty steadying himself in a shower bench and was unable to get out. She called EMS. They assisted the patient out and helped him to bed. This morning, she noticed that her husband was even more weak. He was having difficulty moving from bed and he generally seemed out of it. She called EMS. When they arrived it was noted that he was hypotensive and tachy. So he was brought to the ED for evaluation. He denies any N/V/D or sick contacts. He had subjective fever at home this morning. He denies any other aggravating or alleviating factors.     Subjective: 8/6 afebrile overnight Patient again refused CPAP.  Patient states he was never offered CPAP, however charted that CPAP was offered   Assessment & Plan: Covid vaccination;   Principal Problem:   Sepsis ([place]) Active Problems:   History of malignant melanoma. left leg   Hyperlipidemia   Essential hypertension   Parkinson's plus syndrome (HCC)   OSA (obstructive sleep apnea)   BPH associated with nocturia   Lymphedema   Prediabetes   Severe sepsis (HCC)   Obesity (BMI 35.0-39.9 without comorbidity)   Pressure injury of buttock, stage 2 (HCC)  Severe sepsis -On admission make criteria for SIRS RR> 20, WBC> 12 K, temp> 38 C.  Lactic acid> 2 - Cultures pending - Continue current antibiotics - 8/4 trend lactic acid/procalcitonin  Latest Reference Range & Units 02/23/22 11:05 02/23/22 13:34 02/23/22 15:43 02/23/22 18:41  Lactic Acid, Venous 0.5 - 1.9 mmol/L 2.5 (HH) 6.0 (HH) 1.5 1.1  ([place]): Data is critically  high  Latest Reference Range & Units 02/24/22 02:29 02/24/22 13:49 02/25/22 03:02  Procalcitonin ng/mL 1.30 0.80 0.66    Parkinson's plus syndrome - Sinemet 25-100 mg 2 tablets QID    Chronic lymphedema -Per patient wears compression stockings at home.  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 -Per EMR wife reports that he legs -- from an edema standpoint -- look very good; she notes that his erythema comes and goes; not sure that this is a cellulitis component, but the above abx would be covering it -8/4 Per patient stable  Acute diastolic CHF - 8/6 echocardiogram consistent with diastolic CHF see below -Strict in and out -3.3 L - Daily weight Filed Weights   02/23/22 1050 02/23/22 1600  Weight: 99.8 kg 105.6 kg  -8/5 Lasix IV 80 mg -8/5 Lisinopril 2.5 mg daily -8/6 Lasix IV 80 mg daily   Essential HTN - See CHF     HLD - 8/6 LDL= 45: Within goal - Crestor 10 mg daily   OSA -CPAP per respiratory qhs -8/5 counseled patient on the importance of using CPAP while hospitalized.  Patient agreed - 8/5 counseled that even though he is sleeping in a chair now upon discharge should arrange for outpatient sleep study. -8/6 patient again agreed to use CPAP at night.   BPH -  Not on any home medication.   Overactive bladder - Gemtesa 75 mg qhs   Hyperglycemia/Prediabetes -5/11 Hemoglobin A1c= 5.9: Meets criteria for prediabetes - 8/4 prior to discharge will discuss with patient starting Metformin  -8/6 change to Very Sensitive SSI  LEFT buttocks pressure injury Pressure Injury 02/23/22 Buttocks Left Deep Tissue Pressure Injury - Purple or maroon localized area of discolored intact skin or blood-filled blister due to damage of underlying soft tissue from pressure and/or shear. (Active)  02/23/22 1521  Location: Buttocks  Location  Orientation: Left  Staging: Deep Tissue Pressure Injury - Purple or maroon localized area of discolored intact skin or blood-filled blister due to damage of underlying soft tissue from pressure and/or shear.  Wound Description (Comments):   Present on Admission: Yes  Dressing Type Foam - Lift dressing to assess site every shift 02/25/22 2000     Pressure Injury 02/23/22 Buttocks Right Deep Tissue Pressure Injury - Purple or maroon localized area of discolored intact skin or blood-filled blister due to damage of underlying soft tissue from pressure and/or shear. (Active)  02/23/22 1522  Location: Buttocks  Location Orientation: Right  Staging: Deep Tissue Pressure Injury - Purple or maroon localized area of discolored intact skin or blood-filled blister due to damage of underlying soft tissue from pressure and/or shear.  Wound Description (Comments):   Present on Admission: Yes  Dressing Type Foam - Lift dressing to assess site every shift 02/25/22 2000  -8/4 wound care consult  Obesity (BMI36.46 kg/m.) -     Mobility Assessment (last 72 hours)     Mobility Assessment   No documentation.                  DVT prophylaxis: Lovenox Code Status: DNR Family Communication:  Status is: Inpatient    Dispo: The patient is from: Home              Anticipated d/c is to: Home              Anticipated d/c date is: > 3 days              Patient currently is not medically stable to d/c.      Consultants:    Procedures/Significant Events:  8/5 Echocardiogram; Left Ventricle: LVEF= 60 to 65%.   Left ventricular diastolic parameters are consistent with Grade I diastolic dysfunction (impaired  relaxation).    I have personally reviewed and interpreted all radiology studies and my findings are as above.  VENTILATOR SETTINGS:    Cultures   Antimicrobials: Anti-infectives (From admission, onward)    Start     Ordered Stop   02/24/22 1400  vancomycin (VANCOCIN) IVPB  1000 mg/200 mL premix  Status:  Discontinued        02/23/22 1338 02/24/22 1004   02/24/22 1400  vancomycin (VANCOREADY) IVPB 1250 mg/250 mL        02/24/22 1004 03/02/22 1359   02/23/22 2200  metroNIDAZOLE (FLAGYL) IVPB 500 mg        02/23/22 1524 03/02/22 2159   02/23/22 2000  ceFEPIme (MAXIPIME) 2 g in sodium chloride 0.9 % 100 mL IVPB        02/23/22 1338 03/02/22 1359   02/23/22 1130  ceFEPIme (MAXIPIME) 2 g in sodium chloride 0.9 % 100 mL IVPB        02/23/22 1121 02/23/22 1427   02/23/22 1130  metroNIDAZOLE (FLAGYL) IVPB 500 mg        02/23/22 1121 02/23/22  1336   02/23/22 1130  vancomycin (VANCOCIN) IVPB 1000 mg/200 mL premix  Status:  Discontinued        02/23/22 1121 02/23/22 1127   02/23/22 1130  vancomycin (VANCOREADY) IVPB 2000 mg/400 mL        02/23/22 1127 02/23/22 1444         Devices    LINES / TUBES:      Continuous Infusions:  ceFEPime (MAXIPIME) IV 2 g (02/26/22 0646)   metronidazole Stopped (02/26/22 0019)   vancomycin Stopped (02/25/22 1516)     Objective: Vitals:   02/26/22 0100 02/26/22 0200 02/26/22 0300 02/26/22 0400  BP: 134/62 (!) 149/53 (!) 147/79 (!) 134/56  Pulse: 77 86 80 70  Resp: '15 20 17 16  '$ Temp:    98.9 F (37.2 C)  TempSrc:    Oral  SpO2: 92% 92% 96% 95%  Weight:      Height:        Intake/Output Summary (Last 24 hours) at 02/26/2022 0813 Last data filed at 02/26/2022 0230 Gross per 24 hour  Intake 643.71 ml  Output 4700 ml  Net -4056.29 ml    Filed Weights   02/23/22 1050 02/23/22 1600  Weight: 99.8 kg 105.6 kg    Examination:  General:/A/O x4, No acute respiratory distress Eyes: negative scleral hemorrhage, negative anisocoria, negative icterus ENT: Negative Runny nose, negative gingival bleeding, Neck:  Negative scars, masses, torticollis, lymphadenopathy, JVD Lungs: Clear to auscultation bilaterally without wheezes or crackles Cardiovascular: Regular rate and rhythm without murmur gallop or rub normal S1 and  S2 Abdomen: negative abdominal pain, nondistended, positive soft, bowel sounds, no rebound, no ascites, no appreciable mass Extremities: bilateral lower extremity edema 2-3+ to mid thigh (improving) Skin: Negative rashes, lesions, ulcers Psychiatric:  Negative depression, negative anxiety, negative fatigue, negative mania  Central nervous system:  Cranial nerves II through XII intact, tongue/uvula midline, all extremities muscle strength 5/5, sensation intact throughout, negative dysarthria, negative expressive aphasia, negative receptive aphasia.  .     Data Reviewed: Care during the described time interval was provided by me .  I have reviewed this patient's available data, including medical history, events of note, physical examination, and all test results as part of my evaluation.  CBC: Recent Labs  Lab 02/23/22 1105 02/24/22 0229 02/25/22 0302 02/26/22 0305  WBC 24.3* 12.5* 8.9 7.1  NEUTROABS 22.3*  --  6.1 4.7  HGB 14.6 12.4* 12.7* 13.7  HCT 44.9 39.1 40.2 42.4  MCV 91.3 92.4 92.8 91.6  PLT 194 140* 142* 841    Basic Metabolic Panel: Recent Labs  Lab 02/23/22 1105 02/24/22 0229 02/25/22 0302 02/26/22 0305  NA 137 138 139 140  K 4.2 3.9 4.0 3.6  CL 110 107 107 106  CO2 20* '25 24 25  '$ GLUCOSE 127* 94 92 98  BUN 33* '23 18 22  '$ CREATININE 1.16 0.67 0.73 0.76  CALCIUM 8.5* 8.1* 8.6* 8.9  MG  --   --  2.1 2.1  PHOS  --   --  2.5 3.6    GFR: Estimated Creatinine Clearance: 92.4 mL/min (by C-G formula based on SCr of 0.76 mg/dL). Liver Function Tests: Recent Labs  Lab 02/23/22 1105 02/24/22 0229 02/25/22 0302 02/26/22 0305  AST 13* 15 14* 12*  ALT '6 5 7 '$ <5  ALKPHOS 59 47 49 53  BILITOT 0.8 0.7 0.6 0.6  PROT 6.5 5.6* 6.0* 6.3*  ALBUMIN 3.8 3.2* 3.3* 3.5    No results for input(s): "LIPASE", "AMYLASE"  in the last 168 hours. No results for input(s): "AMMONIA" in the last 168 hours. Coagulation Profile: Recent Labs  Lab 02/23/22 1200 02/24/22 0229  INR  1.3* 1.3*    Cardiac Enzymes: No results for input(s): "CKTOTAL", "CKMB", "CKMBINDEX", "TROPONINI" in the last 168 hours. BNP (last 3 results) No results for input(s): "PROBNP" in the last 8760 hours. HbA1C: No results for input(s): "HGBA1C" in the last 72 hours. CBG: Recent Labs  Lab 02/25/22 1208 02/25/22 1558 02/25/22 2129 02/26/22 0010 02/26/22 0416  GLUCAP 102* 117* 113* 93 98   Lipid Profile: Recent Labs    02/26/22 0305  CHOL 96  HDL 33*  LDLCALC 45  TRIG 91  CHOLHDL 2.9   Thyroid Function Tests: No results for input(s): "TSH", "T4TOTAL", "FREET4", "T3FREE", "THYROIDAB" in the last 72 hours. Anemia Panel: No results for input(s): "VITAMINB12", "FOLATE", "FERRITIN", "TIBC", "IRON", "RETICCTPCT" in the last 72 hours. Sepsis Labs: Recent Labs  Lab 02/23/22 1105 02/23/22 1334 02/23/22 1543 02/23/22 1841 02/24/22 0229 02/24/22 1349 02/25/22 0302 02/26/22 0305  PROCALCITON  --   --   --   --  1.30 0.80 0.66 0.38  LATICACIDVEN 2.5* 6.0* 1.5 1.1  --   --   --   --      Recent Results (from the past 240 hour(s))  Culture, blood (Routine x 2)     Status: None (Preliminary result)   Collection Time: 02/23/22 11:05 AM   Specimen: BLOOD  Result Value Ref Range Status   Specimen Description   Final    BLOOD BLOOD LEFT WRIST Performed at [facility] [address] [place], [place] [address]    Special Requests   Final    BOTTLES DRAWN AEROBIC AND ANAEROBIC Blood Culture adequate volume Performed at [facility] [address], [place], [place] [address]    Culture   Final    NO GROWTH 2 DAYS Performed at [facility] [address], [place], [place] [address]    Report Status PENDING  Incomplete  Culture, blood (Routine x 2)     Status: None (Preliminary result)   Collection Time: 02/23/22 11:10 AM   Specimen: BLOOD  Result Value Ref Range Status   Specimen Description   Final    BLOOD BLOOD RIGHT  FOREARM Performed at [facility] [address] [place], [place] [address]    Special Requests   Final    BOTTLES DRAWN AEROBIC AND ANAEROBIC Blood Culture results may not be optimal due to an inadequate volume of blood received in culture bottles Performed at [facility] [address], [place], [place] [address]    Culture   Final    NO GROWTH 2 DAYS Performed at [name] [address], [place], [place] [address]    Report Status PENDING  Incomplete  Resp Panel by RT-PCR (Flu A&B, Covid) Anterior Nasal Swab     Status: None   Collection Time: 02/23/22 12:08 PM   Specimen: Anterior Nasal Swab  Result Value Ref Range Status   SARS Coronavirus 2 by RT PCR NEGATIVE NEGATIVE Final    Comment: (NOTE) SARS-CoV-2 target nucleic acids are NOT DETECTED.  The SARS-CoV-2 RNA is generally detectable in upper respiratory specimens during the acute phase of infection. The lowest concentration of SARS-CoV-2 viral copies this assay can detect is 138 copies/mL. A negative result does not preclude SARS-Cov-2 infection and should not be used as the sole basis  for treatment or other patient management decisions. A negative result may occur with  improper specimen collection/handling, submission of specimen other than nasopharyngeal swab, presence of viral mutation(s) within the areas targeted by this assay, and inadequate number of viral copies(<138 copies/mL). A negative result must be combined with clinical observations, patient history, and epidemiological information. The expected result is Negative.  Fact Sheet for Patients:  EntrepreneurPulse.com.au  Fact Sheet for Healthcare Providers:  IncredibleEmployment.be  This test is no t yet approved or cleared by the [place] FDA and  has been authorized for detection and/or diagnosis of SARS-CoV-2 by FDA under an Emergency Use Authorization (EUA).  This EUA will remain  in effect (meaning this test can be used) for the duration of the COVID-19 declaration under Section 564(b)(1) of the Act, 21 U.S.C.section 360bbb-3(b)(1), unless the authorization is terminated  or revoked sooner.       Influenza A by PCR NEGATIVE NEGATIVE Final   Influenza B by PCR NEGATIVE NEGATIVE Final    Comment: (NOTE) The Xpert Xpress SARS-CoV-2/FLU/RSV plus assay is intended as an aid in the diagnosis of influenza from Nasopharyngeal swab specimens and should not be used as a sole basis for treatment. Nasal washings and aspirates are unacceptable for Xpert Xpress SARS-CoV-2/FLU/RSV testing.  Fact Sheet for Patients: EntrepreneurPulse.com.au  Fact Sheet for Healthcare Providers: IncredibleEmployment.be  This test is not yet approved or cleared by the [place] FDA and has been authorized for detection and/or diagnosis of SARS-CoV-2 by FDA under an Emergency Use Authorization (EUA). This EUA will remain in effect (meaning this test can be used) for the duration of the COVID-19 declaration under Section 564(b)(1) of the Act, 21 U.S.C. section 360bbb-3(b)(1), unless the authorization is terminated or revoked.  Performed at [facility], [place] [address] [place], [place] [address]   MRSA Next Gen by PCR, Nasal     Status: None   Collection Time: 02/23/22  4:49 PM   Specimen: Nasal Mucosa; Nasal Swab  Result Value Ref Range Status   MRSA by PCR Next Gen NOT DETECTED NOT DETECTED Final    Comment: (NOTE) The GeneXpert MRSA Assay (FDA approved for NASAL specimens only), is one component of a comprehensive MRSA colonization surveillance program. It is not intended to diagnose MRSA infection nor to guide or monitor treatment for MRSA infections. Test performance is not FDA approved in patients less than 61 years old. Performed at [facility], [place] [address], [place],  [address]          Radiology Studies: ECHOCARDIOGRAM COMPLETE  Result Date: 02/25/2022    ECHOCARDIOGRAM REPORT   Patient Name:   SIPRIANO FENDLEY Date of Exam: 02/25/2022 Medical Rec #:  [id number]       Height:       67.0 in Accession #:    [id number]      Weight:       232.8 lb Date of Birth:  08/29/1945       BSA:          2.157 m Patient Age:    34 years        BP:           151/76 mmHg Patient Gender: M               HR:           80 bpm. Exam Location:  Inpatient Procedure: 2D Echo, Cardiac Doppler and Color Doppler Indications:  CHF  History:        Patient has prior history of Echocardiogram examinations, most                 recent 02/26/2014. Risk Factors:Hypertension and Dyslipidemia.  Sonographer:    Jefferey Pica Referring Phys: [id number] [place]  1. Left ventricular ejection fraction, by estimation, is 60 to 65%. The left ventricle has normal function. The left ventricle has no regional wall motion abnormalities. There is mild concentric left ventricular hypertrophy. Left ventricular diastolic parameters are consistent with Grade I diastolic dysfunction (impaired relaxation).  2. Right ventricular systolic function is normal. The right ventricular size is normal.  3. The mitral valve is normal in structure. No evidence of mitral valve regurgitation. No evidence of mitral stenosis.  4. The aortic valve is tricuspid. Aortic valve regurgitation is not visualized. No aortic stenosis is present.  5. The inferior vena cava is dilated in size with >50% respiratory variability, suggesting right atrial pressure of 8 mmHg. Comparison(s): Prior images unable to be directly viewed, comparison made by report only. FINDINGS  Left Ventricle: Left ventricular ejection fraction, by estimation, is 60 to 65%. The left ventricle has normal function. The left ventricle has no regional wall motion abnormalities. The left ventricular internal cavity size was normal in size. There  is  mild concentric left ventricular hypertrophy. Left ventricular diastolic parameters are consistent with Grade I diastolic dysfunction (impaired relaxation). Right Ventricle: The right ventricular size is normal. Right ventricular systolic function is normal. Left Atrium: Left atrial size was normal in size. Right Atrium: Right atrial size was normal in size. Pericardium: There is no evidence of pericardial effusion. Mitral Valve: The mitral valve is normal in structure. No evidence of mitral valve regurgitation. No evidence of mitral valve stenosis. Tricuspid Valve: The tricuspid valve is normal in structure. Tricuspid valve regurgitation is trivial. No evidence of tricuspid stenosis. Aortic Valve: The aortic valve is tricuspid. Aortic valve regurgitation is not visualized. No aortic stenosis is present. Aortic valve peak gradient measures 6.3 mmHg. Pulmonic Valve: The pulmonic valve was normal in structure. Pulmonic valve regurgitation is not visualized. No evidence of pulmonic stenosis. Aorta: The aortic Marcoe is normal in size and structure. Venous: The inferior vena cava is dilated in size with greater than 50% respiratory variability, suggesting right atrial pressure of 8 mmHg. IAS/Shunts: No atrial level shunt detected by color flow Doppler.  LEFT VENTRICLE PLAX 2D LVIDd:         3.20 cm   Diastology LVIDs:         2.20 cm   LV e' medial:    6.62 cm/s LV PW:         1.40 cm   LV E/e' medial:  9.3 LV IVS:        1.30 cm   LV e' lateral:   7.75 cm/s LVOT diam:     2.00 cm   LV E/e' lateral: 7.9 LV SV:         61 LV SV Index:   28 LVOT Area:     3.14 cm  RIGHT VENTRICLE             IVC RV Basal diam:  3.00 cm     IVC diam: 2.30 cm RV S prime:     15.60 cm/s TAPSE (M-mode): 2.3 cm LEFT ATRIUM             Index        RIGHT ATRIUM  Index LA diam:        3.50 cm 1.62 cm/m   RA Area:     13.00 cm LA Vol (A2C):   50.9 ml 23.60 ml/m  RA Volume:   29.20 ml  13.54 ml/m LA Vol (A4C):   38.5 ml 17.85 ml/m  LA Biplane Vol: 48.4 ml 22.44 ml/m  AORTIC VALVE                 PULMONIC VALVE AV Area (Vmax): 2.55 cm     PV Vmax:       1.06 m/s AV Vmax:        125.50 cm/s  PV Peak grad:  4.5 mmHg AV Peak Grad:   6.3 mmHg LVOT Vmax:      102.00 cm/s LVOT Vmean:     57.000 cm/s LVOT VTI:       0.194 m  AORTA Ao Marti diam: 3.80 cm Ao Asc diam:  3.70 cm MITRAL VALVE MV Area (PHT): 4.39 cm    SHUNTS MV Decel Time: 173 msec    Systemic VTI:  0.19 m MV E velocity: 61.60 cm/s  Systemic Diam: 2.00 cm MV A velocity: 85.40 cm/s MV E/A ratio:  0.72 Kirk Ruths MD Electronically signed by Kirk Ruths MD Signature Date/Time: 02/25/2022/11:14:14 AM    Final         Scheduled Meds:  carbidopa-levodopa  2 tablet Oral QID   Chlorhexidine Gluconate Cloth  6 each Topical Daily   enoxaparin (LOVENOX) injection  40 mg Subcutaneous Q24H   insulin aspart  0-9 Units Subcutaneous Q4H   lisinopril  2.5 mg Oral Daily   mirabegron ER  25 mg Oral QHS   rosuvastatin  10 mg Oral QHS   Continuous Infusions:  ceFEPime (MAXIPIME) IV 2 g (02/26/22 0646)   metronidazole Stopped (02/26/22 0019)   vancomycin Stopped (02/25/22 1516)     LOS: 3 days    Time spent:40 min    Zyann Mabry, Geraldo Docker, MD [facility]   If 7PM-7AM, please contact night-coverage 02/26/2022, 8:13 AM

## 2022-02-27 DIAGNOSIS — A419 Sepsis, unspecified organism: Secondary | ICD-10-CM | POA: Diagnosis not present

## 2022-02-27 DIAGNOSIS — I10 Essential (primary) hypertension: Secondary | ICD-10-CM | POA: Diagnosis not present

## 2022-02-27 DIAGNOSIS — Z85820 Personal history of malignant melanoma of skin: Secondary | ICD-10-CM | POA: Diagnosis not present

## 2022-02-27 DIAGNOSIS — N401 Enlarged prostate with lower urinary tract symptoms: Secondary | ICD-10-CM | POA: Diagnosis not present

## 2022-02-27 LAB — CBC WITH DIFFERENTIAL/PLATELET
Abs Immature Granulocytes: 0.05 10*3/uL (ref 0.00–0.07)
Basophils Absolute: 0 10*3/uL (ref 0.0–0.1)
Basophils Relative: 0 %
Eosinophils Absolute: 0.4 10*3/uL (ref 0.0–0.5)
Eosinophils Relative: 5 %
HCT: 43.9 % (ref 39.0–52.0)
Hemoglobin: 14.3 g/dL (ref 13.0–17.0)
Immature Granulocytes: 1 %
Lymphocytes Relative: 16 %
Lymphs Abs: 1.2 10*3/uL (ref 0.7–4.0)
MCH: 29.5 pg (ref 26.0–34.0)
MCHC: 32.6 g/dL (ref 30.0–36.0)
MCV: 90.5 fL (ref 80.0–100.0)
Monocytes Absolute: 0.7 10*3/uL (ref 0.1–1.0)
Monocytes Relative: 9 %
Neutro Abs: 5.3 10*3/uL (ref 1.7–7.7)
Neutrophils Relative %: 69 %
Platelets: 198 10*3/uL (ref 150–400)
RBC: 4.85 MIL/uL (ref 4.22–5.81)
RDW: 14.6 % (ref 11.5–15.5)
WBC: 7.6 10*3/uL (ref 4.0–10.5)
nRBC: 0 % (ref 0.0–0.2)

## 2022-02-27 LAB — COMPREHENSIVE METABOLIC PANEL
ALT: 6 U/L (ref 0–44)
AST: 13 U/L — ABNORMAL LOW (ref 15–41)
Albumin: 3.6 g/dL (ref 3.5–5.0)
Alkaline Phosphatase: 54 U/L (ref 38–126)
Anion gap: 8 (ref 5–15)
BUN: 26 mg/dL — ABNORMAL HIGH (ref 8–23)
CO2: 28 mmol/L (ref 22–32)
Calcium: 8.9 mg/dL (ref 8.9–10.3)
Chloride: 103 mmol/L (ref 98–111)
Creatinine, Ser: 0.88 mg/dL (ref 0.61–1.24)
GFR, Estimated: >60 mL/min (ref >60)
Glucose, Bld: 101 mg/dL — ABNORMAL HIGH (ref 70–99)
Potassium: 3.5 mmol/L (ref 3.5–5.1)
Sodium: 139 mmol/L (ref 135–145)
Total Bilirubin: 0.7 mg/dL (ref 0.3–1.2)
Total Protein: 6.6 g/dL (ref 6.5–8.1)

## 2022-02-27 LAB — MAGNESIUM: Magnesium: 2.1 mg/dL (ref 1.7–2.4)

## 2022-02-27 LAB — GLUCOSE, CAPILLARY
Glucose-Capillary: 101 mg/dL — ABNORMAL HIGH (ref 70–99)
Glucose-Capillary: 103 mg/dL — ABNORMAL HIGH (ref 70–99)
Glucose-Capillary: 216 mg/dL — ABNORMAL HIGH (ref 70–99)
Glucose-Capillary: 92 mg/dL (ref 70–99)
Glucose-Capillary: 96 mg/dL (ref 70–99)
Glucose-Capillary: 98 mg/dL (ref 70–99)

## 2022-02-27 LAB — PHOSPHORUS: Phosphorus: 3.4 mg/dL (ref 2.5–4.6)

## 2022-02-27 NOTE — Care Management Important Message (Signed)
Important Message  Patient Details IM Letter placed in Patients room. Name: Nicholas [name] MRN: [id number] Date of Birth: [birth year]   Medicare Important Message Given:  Yes     Kerin Salen 02/27/2022, 3:14 PM

## 2022-02-27 NOTE — Progress Notes (Signed)
PROGRESS NOTE    Nicholas [name]  PPI:[id number] DOB: June 12, 1946 DOA: 02/23/2022 PCP: Nicholas Olp, MD     Brief Narrative:  Nicholas [name] is a 76 y.o. WM PMHx  HTN, HLD, Parkinson's Plus syndrome, pre-diabetes.   Presenting with weakness. History is from wife at bedside. She reports that the patient was in his normal state of health until last night. She noticed that he was wheezy and appeared weak. He was having difficulty steadying himself in a shower bench and was unable to get out. She called EMS. They assisted the patient out and helped him to bed. This morning, she noticed that her husband was even more weak. He was having difficulty moving from bed and he generally seemed out of it. She called EMS. When they arrived it was noted that he was hypotensive and tachy. So he was brought to the ED for evaluation. He denies any N/V/D or sick contacts. He had subjective fever at home this morning. He denies any other aggravating or alleviating factors.     Subjective: 8/7 afebrile overnight, A/O x 4,   Assessment & Plan: Covid vaccination;   Principal Problem:   Sepsis ([place]) Active Problems:   History of malignant melanoma. left leg   Hyperlipidemia   Essential hypertension   Parkinson's plus syndrome (HCC)   OSA (obstructive sleep apnea)   BPH associated with nocturia   Lymphedema   Prediabetes   Severe sepsis (HCC)   Obesity (BMI 35.0-39.9 without comorbidity)   Pressure injury of buttock, stage 2 (HCC)  Severe sepsis -On admission make criteria for SIRS RR> 20, WBC> 12 K, temp> 38 C.  Lactic acid> 2 - Cultures pending - Continue current antibiotics - 8/4 trend lactic acid/procalcitonin  Latest Reference Range & Units 02/23/22 11:05 02/23/22 13:34 02/23/22 15:43 02/23/22 18:41  Lactic Acid, Venous 0.5 - 1.9 mmol/L 2.5 (HH) 6.0 (HH) 1.5 1.1  ([place]): Data is critically high  Latest Reference Range & Units 02/24/22 02:29 02/24/22 13:49 02/25/22 03:02  Procalcitonin  ng/mL 1.30 0.80 0.66  -8/7 complete 7-day course antibiotics sepsis unknown cause.  Parkinson's plus syndrome - Sinemet 25-100 mg 2 tablets QID    Chronic lymphedema -Per patient wears compression stockings at home.  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 -Per EMR wife reports that he legs -- from an edema standpoint -- look very good; she notes that his erythema comes and goes; not sure that this is a cellulitis component, but the above abx would be covering it -8/4 Per patient stable  Acute diastolic CHF - 8/6 echocardiogram consistent with diastolic CHF see below -Strict in and out -5.0 L - Daily weight Filed Weights   02/23/22 1050 02/23/22 1600  Weight: 99.8 kg 105.6 kg  -8/5 Lasix IV 80 mg -8/5 Lisinopril 2.5 mg daily -8/6 Lasix IV 80 mg daily   Essential HTN - See CHF     HLD - 8/6 LDL= 45: Within goal - Crestor 10 mg daily   OSA -CPAP per respiratory qhs -8/5 counseled patient on the importance of using CPAP while hospitalized.  Patient agreed - 8/5 counseled that even though he is sleeping in a chair now upon discharge should arrange for outpatient sleep study. -8/6 patient again agreed to use CPAP at night.   BPH -Not on any home medication.   Overactive bladder - [name]  75 mg qhs   Hyperglycemia/Prediabetes -5/11 Hemoglobin A1c= 5.9: Meets criteria for prediabetes - 8/4 prior to discharge will discuss with patient starting Metformin  -8/6 change to Very Sensitive SSI  LEFT buttocks pressure injury Pressure Injury 02/23/22 Buttocks Left Deep Tissue Pressure Injury - Purple or maroon localized area of discolored intact skin or blood-filled blister due to damage of underlying soft tissue from pressure and/or shear. (Active)  02/23/22 1521  Location: Buttocks  Location Orientation: Left  Staging: Deep Tissue Pressure Injury - Purple or maroon  localized area of discolored intact skin or blood-filled blister due to damage of underlying soft tissue from pressure and/or shear.  Wound Description (Comments):   Present on Admission: Yes  Dressing Type Foam - Lift dressing to assess site every shift 02/26/22 1450     Pressure Injury 02/23/22 Buttocks Right Deep Tissue Pressure Injury - Purple or maroon localized area of discolored intact skin or blood-filled blister due to damage of underlying soft tissue from pressure and/or shear. (Active)  02/23/22 1522  Location: Buttocks  Location Orientation: Right  Staging: Deep Tissue Pressure Injury - Purple or maroon localized area of discolored intact skin or blood-filled blister due to damage of underlying soft tissue from pressure and/or shear.  Wound Description (Comments):   Present on Admission: Yes  Dressing Type Foam - Lift dressing to assess site every shift 02/26/22 1450  -8/4 wound care: Per wound care Sacral foam dressing in use, can continue as prophylactic measure. Off loading to buttocks by turning and repositioning very important to prevent PI development  Obesity (BMI36.46 kg/m.) -  Goals of care - 8/7 PT/OT consult: Patient with CHF and Parkinson's.  Requires CIR or SNF.  Evaluate for CIR or SNF     Mobility Assessment (last 72 hours)     Mobility Assessment     Row Name 02/26/22 2027           Does patient have an order for bedrest or is patient medically unstable No - Continue assessment       What is the highest level of mobility based on the progressive mobility assessment? Level 2 ([place]) - Balance while sitting on edge of bed and cannot stand       Is the above level different from baseline mobility prior to current illness? No - Consider discontinuing PT/OT                       DVT prophylaxis: Lovenox Code Status: DNR Family Communication: 8/7 spoke with wife discussed plan of care all questions answered Status is: Inpatient    Dispo:  The patient is from: Home              Anticipated d/c is to: Home              Anticipated d/c date is: > 3 days              Patient currently is not medically stable to d/c.      Consultants:    Procedures/Significant Events:  8/5 Echocardiogram; Left Ventricle: LVEF= 60 to 65%.   Left ventricular diastolic parameters are consistent with Grade I diastolic dysfunction (impaired  relaxation).    I have personally reviewed and interpreted all radiology studies and my findings are as above.  VENTILATOR SETTINGS:    Cultures 8/3 urine negative 8/3 blood left wrist NGTD 8/3 RIGHT forearm NGTD 8/3 MRSA by PCR negative    Antimicrobials: Anti-infectives (From  admission, onward)    Start     Ordered Stop   02/24/22 1400  vancomycin (VANCOCIN) IVPB 1000 mg/200 mL premix  Status:  Discontinued        02/23/22 1338 02/24/22 1004   02/24/22 1400  vancomycin (VANCOREADY) IVPB 1250 mg/250 mL        02/24/22 1004 03/02/22 1359   02/23/22 2200  metroNIDAZOLE (FLAGYL) IVPB 500 mg        02/23/22 1524 03/02/22 2159   02/23/22 2000  ceFEPIme (MAXIPIME) 2 g in sodium chloride 0.9 % 100 mL IVPB        02/23/22 1338 03/02/22 1359   02/23/22 1130  ceFEPIme (MAXIPIME) 2 g in sodium chloride 0.9 % 100 mL IVPB        02/23/22 1121 02/23/22 1427   02/23/22 1130  metroNIDAZOLE (FLAGYL) IVPB 500 mg        02/23/22 1121 02/23/22 1336   02/23/22 1130  vancomycin (VANCOCIN) IVPB 1000 mg/200 mL premix  Status:  Discontinued        02/23/22 1121 02/23/22 1127   02/23/22 1130  vancomycin (VANCOREADY) IVPB 2000 mg/400 mL        02/23/22 1127 02/23/22 1444         Devices    LINES / TUBES:      Continuous Infusions:  ceFEPime (MAXIPIME) IV 2 g (02/27/22 0555)   metronidazole 500 mg (02/27/22 1009)   vancomycin Stopped (02/26/22 1548)     Objective: Vitals:   02/26/22 1732 02/26/22 2147 02/27/22 0444 02/27/22 0901  BP: 124/69 131/66 133/78 137/83  Pulse: 80 84 85 80  Resp: '17  18 16 18  '$ Temp: (!) 97.5 F (36.4 C) 98.6 F (37 C) 99 F (37.2 C)   TempSrc: Oral Oral Oral   SpO2: 96% 95% 94% 96%  Weight:      Height:        Intake/Output Summary (Last 24 hours) at 02/27/2022 1258 Last data filed at 02/27/2022 1000 Gross per 24 hour  Intake 1505.7 ml  Output 2000 ml  Net -494.3 ml    Filed Weights   02/23/22 1050 02/23/22 1600  Weight: 99.8 kg 105.6 kg    Examination:  General:/A/O x4, No acute respiratory distress Eyes: negative scleral hemorrhage, negative anisocoria, negative icterus ENT: Negative Runny nose, negative gingival bleeding, Neck:  Negative scars, masses, torticollis, lymphadenopathy, JVD Lungs: Clear to auscultation bilaterally without wheezes or crackles Cardiovascular: Regular rate and rhythm without murmur gallop or rub normal S1 and S2 Abdomen: negative abdominal pain, nondistended, positive soft, bowel sounds, no rebound, no ascites, no appreciable mass Extremities: bilateral lower extremity edema 2+ to mid thigh (improving) Skin: Negative rashes, lesions, ulcers Psychiatric:  Negative depression, negative anxiety, negative fatigue, negative mania  Central nervous system:  Cranial nerves II through XII intact, tongue/uvula midline, all extremities muscle strength 5/5, sensation intact throughout, negative dysarthria, negative expressive aphasia, negative receptive aphasia.  .     Data Reviewed: Care during the described time interval was provided by me .  I have reviewed this patient's available data, including medical history, events of note, physical examination, and all test results as part of my evaluation.  CBC: Recent Labs  Lab 02/23/22 1105 02/24/22 0229 02/25/22 0302 02/26/22 0305 02/27/22 0341  WBC 24.3* 12.5* 8.9 7.1 7.6  NEUTROABS 22.3*  --  6.1 4.7 5.3  HGB 14.6 12.4* 12.7* 13.7 14.3  HCT 44.9 39.1 40.2 42.4 43.9  MCV 91.3 92.4 92.8 91.6 90.5  PLT 194 140* 142* 158 277    Basic Metabolic Panel: Recent  Labs  Lab 02/23/22 1105 02/24/22 0229 02/25/22 0302 02/26/22 0305 02/27/22 0341  NA 137 138 139 140 139  K 4.2 3.9 4.0 3.6 3.5  CL 110 107 107 106 103  CO2 20* '25 24 25 28  '$ GLUCOSE 127* 94 92 98 101*  BUN 33* '23 18 22 '$ 26*  CREATININE 1.16 0.67 0.73 0.76 0.88  CALCIUM 8.5* 8.1* 8.6* 8.9 8.9  MG  --   --  2.1 2.1 2.1  PHOS  --   --  2.5 3.6 3.4    GFR: Estimated Creatinine Clearance: 84 mL/min (by C-G formula based on SCr of 0.88 mg/dL). Liver Function Tests: Recent Labs  Lab 02/23/22 1105 02/24/22 0229 02/25/22 0302 02/26/22 0305 02/27/22 0341  AST 13* 15 14* 12* 13*  ALT '6 5 7 '$ <5 6  ALKPHOS 59 47 49 53 54  BILITOT 0.8 0.7 0.6 0.6 0.7  PROT 6.5 5.6* 6.0* 6.3* 6.6  ALBUMIN 3.8 3.2* 3.3* 3.5 3.6    No results for input(s): "LIPASE", "AMYLASE" in the last 168 hours. No results for input(s): "AMMONIA" in the last 168 hours. Coagulation Profile: Recent Labs  Lab 02/23/22 1200 02/24/22 0229  INR 1.3* 1.3*    Cardiac Enzymes: No results for input(s): "CKTOTAL", "CKMB", "CKMBINDEX", "TROPONINI" in the last 168 hours. BNP (last 3 results) No results for input(s): "PROBNP" in the last 8760 hours. HbA1C: No results for input(s): "HGBA1C" in the last 72 hours. CBG: Recent Labs  Lab 02/26/22 2004 02/26/22 2356 02/27/22 0447 02/27/22 0724 02/27/22 1154  GLUCAP 132* 124* 92 96 98    Lipid Profile: Recent Labs    02/26/22 0305  CHOL 96  HDL 33*  LDLCALC 45  TRIG 91  CHOLHDL 2.9    Thyroid Function Tests: No results for input(s): "TSH", "T4TOTAL", "FREET4", "T3FREE", "THYROIDAB" in the last 72 hours. Anemia Panel: No results for input(s): "VITAMINB12", "FOLATE", "FERRITIN", "TIBC", "IRON", "RETICCTPCT" in the last 72 hours. Sepsis Labs: Recent Labs  Lab 02/23/22 1105 02/23/22 1334 02/23/22 1543 02/23/22 1841 02/24/22 0229 02/24/22 1349 02/25/22 0302 02/26/22 0305  PROCALCITON  --   --   --   --  1.30 0.80 0.66 0.38  LATICACIDVEN 2.5* 6.0* 1.5  1.1  --   --   --   --      Recent Results (from the past 240 hour(s))  Urine Culture     Status: None   Collection Time: 02/23/22 10:58 AM   Specimen: Urine, Clean Catch  Result Value Ref Range Status   Specimen Description   Final    URINE, CLEAN CATCH Performed at [facility], [name] [address], [place], [place] [address]    Special Requests   Final    NONE Performed at [facility], [place] [address] [place], [place] [address]    Culture   Final    NO GROWTH Performed at [facility], [place] [address] [place], [place] [address]    Report Status 02/26/2022 FINAL  Final  Culture, blood (Routine x 2)     Status: None (Preliminary result)   Collection Time: 02/23/22 11:05 AM   Specimen: BLOOD  Result Value Ref Range Status   Specimen Description   Final    BLOOD BLOOD LEFT WRIST Performed at [place] [address], [place],  [address]    Special Requests   Final  BOTTLES DRAWN AEROBIC AND ANAEROBIC Blood Culture adequate volume Performed at [place] [address], [place], [place] [address]    Culture   Final    NO GROWTH 4 DAYS Performed at [facility], [place] [address], [place], [place] [address]    Report Status PENDING  Incomplete  Culture, blood (Routine x 2)     Status: None (Preliminary result)   Collection Time: 02/23/22 11:10 AM   Specimen: BLOOD  Result Value Ref Range Status   Specimen Description   Final    BLOOD BLOOD RIGHT FOREARM Performed at [place] [address] [place], [place] [address]    Special Requests   Final    BOTTLES DRAWN AEROBIC AND ANAEROBIC Blood Culture results may not be optimal due to an inadequate volume of blood received in culture bottles Performed at [place] [address], [place], [place] [address]    Culture   Final    NO GROWTH 4 DAYS Performed at [place], [place] [address] [place], [place] [address]    Report Status PENDING  Incomplete  Resp Panel by RT-PCR (Flu A&B, Covid) Anterior Nasal Swab     Status: None   Collection Time: 02/23/22 12:08 PM   Specimen: Anterior Nasal Swab  Result Value Ref Range Status   SARS Coronavirus 2 by RT PCR NEGATIVE NEGATIVE Final    Comment: (NOTE) SARS-CoV-2 target nucleic acids are NOT DETECTED.  The SARS-CoV-2 RNA is generally detectable in upper respiratory specimens during the acute phase of infection. The lowest concentration of SARS-CoV-2 viral copies this assay can detect is 138 copies/mL. A negative result does not preclude SARS-Cov-2 infection and should not be used as the sole basis for treatment or other patient management decisions. A negative result may occur with  improper specimen collection/handling, submission of specimen other than nasopharyngeal swab, presence of viral mutation(s) within the areas targeted by this assay, and inadequate number of viral copies(<138 copies/mL). A negative result must be combined with clinical observations, patient history, and epidemiological information. The expected result is Negative.  Fact Sheet for Patients:  EntrepreneurPulse.com.au  Fact Sheet for Healthcare Providers:  IncredibleEmployment.be  This test is no t yet approved or cleared by the [place] FDA and  has been authorized for detection and/or diagnosis of SARS-CoV-2 by FDA under an Emergency Use Authorization (EUA). This EUA will remain  in effect (meaning this test can be used) for the duration of the COVID-19 declaration under Section 564(b)(1) of the Act, 21 U.S.C.section 360bbb-3(b)(1), unless the authorization is terminated  or revoked sooner.       Influenza A by PCR NEGATIVE NEGATIVE Final   Influenza B by PCR NEGATIVE NEGATIVE Final    Comment: (NOTE) The Xpert Xpress SARS-CoV-2/FLU/RSV plus assay is intended as an aid in the  diagnosis of influenza from Nasopharyngeal swab specimens and should not be used as a sole basis for treatment. Nasal washings and aspirates are unacceptable for Xpert Xpress SARS-CoV-2/FLU/RSV testing.  Fact Sheet for Patients: EntrepreneurPulse.com.au  Fact Sheet for Healthcare Providers: IncredibleEmployment.be  This test is not yet approved or cleared by the [place] FDA and has been authorized for detection and/or diagnosis of SARS-CoV-2 by FDA under an Emergency Use Authorization (EUA). This EUA will remain in effect (meaning this test can be used) for the duration of the COVID-19 declaration under Section 564(b)(1) of the Act, 21 U.S.C. section 360bbb-3(b)(1), unless the authorization  is terminated or revoked.  Performed at [facility], [place] [address], [place], [place] [address]   MRSA Next Gen by PCR, Nasal     Status: None   Collection Time: 02/23/22  4:49 PM   Specimen: Nasal Mucosa; Nasal Swab  Result Value Ref Range Status   MRSA by PCR Next Gen NOT DETECTED NOT DETECTED Final    Comment: (NOTE) The GeneXpert MRSA Assay (FDA approved for NASAL specimens only), is one component of a comprehensive MRSA colonization surveillance program. It is not intended to diagnose MRSA infection nor to guide or monitor treatment for MRSA infections. Test performance is not FDA approved in patients less than 13 years old. Performed at [facility], [place] [address], [place], [place] [address]          Radiology Studies: No results found.      Scheduled Meds:  carbidopa-levodopa  2 tablet Oral QID   Chlorhexidine Gluconate Cloth  6 each Topical Daily   enoxaparin (LOVENOX) injection  40 mg Subcutaneous Q24H   furosemide  80 mg Intravenous Daily   insulin aspart  0-6 Units Subcutaneous Q4H   lisinopril  2.5 mg Oral Daily   mirabegron ER  25 mg Oral QHS   rosuvastatin  10 mg Oral QHS    sorbitol, milk of mag, mineral oil, glycerin (SMOG) enema  400 mL Rectal Once   Continuous Infusions:  ceFEPime (MAXIPIME) IV 2 g (02/27/22 0555)   metronidazole 500 mg (02/27/22 1009)   vancomycin Stopped (02/26/22 1548)     LOS: 4 days    Time spent:40 min    Duaine Radin, Geraldo Docker, MD [facility]   If 7PM-7AM, please contact night-coverage 02/27/2022, 12:58 PM

## 2022-02-27 NOTE — Consult Note (Signed)
WOC consulted for buttock wounds at the time of this admission, please see consult note 02/24/22. Orders updated.   [place], [place], [place]

## 2022-02-27 NOTE — Progress Notes (Signed)
Transition of Care (TOC) Screening Note  Patient Details  Name: DEARIUS HOFFMANN Date of Birth: Jan 08, 1946  Transition of Care [facility]) CM/SW Contact:    Sherie Don, LCSW Phone Number: 02/27/2022, 9:35 AM  Transition of Care Department [facility]) has reviewed patient and no TOC needs have been identified at this time. We will continue to monitor patient advancement through interdisciplinary progression rounds. If new patient transition needs arise, please place a TOC consult.

## 2022-02-28 DIAGNOSIS — I10 Essential (primary) hypertension: Secondary | ICD-10-CM | POA: Diagnosis not present

## 2022-02-28 DIAGNOSIS — A419 Sepsis, unspecified organism: Secondary | ICD-10-CM | POA: Diagnosis not present

## 2022-02-28 DIAGNOSIS — N401 Enlarged prostate with lower urinary tract symptoms: Secondary | ICD-10-CM | POA: Diagnosis not present

## 2022-02-28 DIAGNOSIS — Z85820 Personal history of malignant melanoma of skin: Secondary | ICD-10-CM | POA: Diagnosis not present

## 2022-02-28 DIAGNOSIS — R69 Illness, unspecified: Secondary | ICD-10-CM

## 2022-02-28 LAB — CBC WITH DIFFERENTIAL/PLATELET
Abs Immature Granulocytes: 0.07 10*3/uL (ref 0.00–0.07)
Basophils Absolute: 0 10*3/uL (ref 0.0–0.1)
Basophils Relative: 0 %
Eosinophils Absolute: 0.5 10*3/uL (ref 0.0–0.5)
Eosinophils Relative: 5 %
HCT: 46.3 % (ref 39.0–52.0)
Hemoglobin: 14.8 g/dL (ref 13.0–17.0)
Immature Granulocytes: 1 %
Lymphocytes Relative: 15 %
Lymphs Abs: 1.5 10*3/uL (ref 0.7–4.0)
MCH: 29.1 pg (ref 26.0–34.0)
MCHC: 32 g/dL (ref 30.0–36.0)
MCV: 91.1 fL (ref 80.0–100.0)
Monocytes Absolute: 0.8 10*3/uL (ref 0.1–1.0)
Monocytes Relative: 8 %
Neutro Abs: 7 10*3/uL (ref 1.7–7.7)
Neutrophils Relative %: 71 %
Platelets: 195 10*3/uL (ref 150–400)
RBC: 5.08 MIL/uL (ref 4.22–5.81)
RDW: 14.4 % (ref 11.5–15.5)
WBC: 9.8 10*3/uL (ref 4.0–10.5)
nRBC: 0 % (ref 0.0–0.2)

## 2022-02-28 LAB — MAGNESIUM: Magnesium: 2.4 mg/dL (ref 1.7–2.4)

## 2022-02-28 LAB — COMPREHENSIVE METABOLIC PANEL
ALT: 7 U/L (ref 0–44)
AST: 21 U/L (ref 15–41)
Albumin: 3.9 g/dL (ref 3.5–5.0)
Alkaline Phosphatase: 58 U/L (ref 38–126)
Anion gap: 11 (ref 5–15)
BUN: 28 mg/dL — ABNORMAL HIGH (ref 8–23)
CO2: 28 mmol/L (ref 22–32)
Calcium: 9.1 mg/dL (ref 8.9–10.3)
Chloride: 103 mmol/L (ref 98–111)
Creatinine, Ser: 0.96 mg/dL (ref 0.61–1.24)
GFR, Estimated: >60 mL/min (ref >60)
Glucose, Bld: 104 mg/dL — ABNORMAL HIGH (ref 70–99)
Potassium: 3.9 mmol/L (ref 3.5–5.1)
Sodium: 142 mmol/L (ref 135–145)
Total Bilirubin: 0.8 mg/dL (ref 0.3–1.2)
Total Protein: 6.9 g/dL (ref 6.5–8.1)

## 2022-02-28 LAB — GLUCOSE, CAPILLARY
Glucose-Capillary: 107 mg/dL — ABNORMAL HIGH (ref 70–99)
Glucose-Capillary: 117 mg/dL — ABNORMAL HIGH (ref 70–99)
Glucose-Capillary: 123 mg/dL — ABNORMAL HIGH (ref 70–99)
Glucose-Capillary: 128 mg/dL — ABNORMAL HIGH (ref 70–99)
Glucose-Capillary: 157 mg/dL — ABNORMAL HIGH (ref 70–99)
Glucose-Capillary: 86 mg/dL (ref 70–99)

## 2022-02-28 LAB — PHOSPHORUS: Phosphorus: 3.1 mg/dL (ref 2.5–4.6)

## 2022-02-28 LAB — CULTURE, BLOOD (ROUTINE X 2)
Culture: NO GROWTH
Culture: NO GROWTH
Special Requests: ADEQUATE

## 2022-02-28 MED ORDER — METOLAZONE 5 MG PO TABS
5.0000 mg | ORAL_TABLET | Freq: Every day | ORAL | Status: AC
  Administered 2022-02-28 – 2022-03-01 (×2): 5 mg via ORAL
  Filled 2022-02-28 (×2): qty 1

## 2022-02-28 NOTE — NC FL2 (Signed)
[place] MEDICAID FL2 LEVEL OF CARE SCREENING TOOL     IDENTIFICATION  Patient Name: Nicholas [name] Birthdate: 1945/12/04 Sex: male Admission Date (Current Location): 02/23/2022  [facility] and [place] Number:  Herbalist and Address:  [facility],  [place], [place]      Provider Number: [id number]  Attending Physician Name and Address:  Allie Bossier, MD  Relative Name and Phone Number:  Scot Shiraishi (wife) Ph: [phone]    Current Level of Care: Hospital Recommended Level of Care: [place] Prior Approval Number:    Date Approved/Denied:   PASRR Number: [id number]  Discharge Plan: SNF    Current Diagnoses: Patient Active Problem List   Diagnosis Date Noted   Severe sepsis ([place]) 02/24/2022   Obesity (BMI 35.0-39.9 without comorbidity) 02/24/2022   Pressure injury of buttock, stage 2 ([place]) 02/24/2022   Sepsis ([place]) 02/23/2022   Prediabetes 02/23/2022   Parkinson's disease ([place]) 01/11/2021   Sepsis without acute organ dysfunction ([place])    Cellulitis 04/12/2020   Strain of right biceps 09/12/2017   Lymphedema 02/10/2017   BPH associated with nocturia 05/16/2016   Senile purpura ([place]) 05/03/2016   OSA (obstructive sleep apnea) 12/28/2015   Hypersomnia 11/15/2015   Cough 11/15/2015   Parkinson's plus syndrome ([place]) 06/22/2015   Dizziness 62/95/2841   Diastolic dysfunction 32/44/0102   Former smoker 04/29/2014   Chronic venous insufficiency 02/20/2014   Hyperglycemia 08/02/2012   Morbid obesity ([place]) 07/13/2010   History of malignant melanoma. left leg 03/09/2009   Insomnia 10/03/2007   Hyperlipidemia 12/13/2006   Essential hypertension 12/13/2006    Orientation RESPIRATION BLADDER Height & Weight     Self, Time, Situation, Place  O2 Incontinent Weight: 219 lb 5.7 oz (99.5 kg) Height:  '5\' 7"'$  (170.2 cm)  BEHAVIORAL SYMPTOMS/MOOD NEUROLOGICAL BOWEL NUTRITION STATUS   (N/A)  (N/A) Continent Diet  (Heart healthy)  AMBULATORY STATUS COMMUNICATION OF NEEDS Skin   Total Care Verbally Other (Comment) (Erythema: buttocks, bilateral legs)                       Personal Care Assistance Level of Assistance  Bathing, Feeding, Dressing Bathing Assistance: Maximum assistance Feeding assistance: Limited assistance Dressing Assistance: Maximum assistance     Functional Limitations Info  Sight, Hearing, Speech Sight Info: Impaired Hearing Info: Adequate Speech Info: Adequate    SPECIAL CARE FACTORS FREQUENCY  PT (By licensed PT), OT (By licensed OT)     PT Frequency: 5x's/week OT Frequency: 5x's/week            Contractures Contractures Info: Not present    Additional Factors Info  Code Status, Allergies, Insulin Sliding Scale Code Status Info: DNR Allergies Info: Amoxicillin   Insulin Sliding Scale Info: See discharge summary       Current Medications (02/28/2022):  This is the current hospital active medication list Current Facility-Administered Medications  Medication Dose Route Frequency Provider Last Rate Last Admin   acetaminophen (TYLENOL) tablet 650 mg  650 mg Oral Q6H PRN Marylyn Ishihara, [name] A, DO   650 mg at 02/24/22 0541   Or   acetaminophen (TYLENOL) suppository 650 mg  650 mg Rectal Q6H PRN Marylyn Ishihara, [name] A, DO       albuterol (PROVENTIL) (2.5 MG/3ML) 0.083% nebulizer solution 2.5 mg  2.5 mg Inhalation Q6H PRN Marylyn Ishihara, [name] A, DO       carbidopa-levodopa (SINEMET IR) 25-100 MG per tablet immediate release 2 tablet  2 tablet Oral QID Marylyn Ishihara, [name] A, DO   2 tablet at 02/28/22 1126   ceFEPIme (MAXIPIME) 2 g in sodium chloride 0.9 % 100 mL IVPB  2 g Intravenous Q8H [place], Abby K, RPH 200 mL/hr at 02/28/22 0616 2 g at 02/28/22 1030   Chlorhexidine Gluconate Cloth 2 % PADS 6 each  6 each Topical Daily Marylyn Ishihara, [name] A, DO   6 each at 02/28/22 1000   enoxaparin (LOVENOX) injection 40 mg  40 mg Subcutaneous Q24H Kyle, [name] A, DO   40 mg at 02/27/22 2202   furosemide  (LASIX) injection 80 mg  80 mg Intravenous Daily Allie Bossier, MD   80 mg at 02/28/22 0933   HYDROcodone-acetaminophen (NORCO/VICODIN) 5-325 MG per tablet 1-2 tablet  1-2 tablet Oral Q4H PRN Marylyn Ishihara, [name] A, DO       insulin aspart (novoLOG) injection 0-6 Units  0-6 Units Subcutaneous Q4H Allie Bossier, MD   2 Units at 02/27/22 1952   lisinopril (ZESTRIL) tablet 2.5 mg  2.5 mg Oral Daily Allie Bossier, MD   2.5 mg at 02/28/22 0932   metroNIDAZOLE (FLAGYL) IVPB 500 mg  500 mg Intravenous Q12H Kyle, [name] A, DO 100 mL/hr at 02/28/22 0942 500 mg at 02/28/22 0942   mirabegron ER (MYRBETRIQ) tablet 25 mg  25 mg Oral QHS Allie Bossier, MD   25 mg at 02/27/22 2201   ondansetron (ZOFRAN) tablet 4 mg  4 mg Oral Q6H PRN Cherylann Ratel A, DO       Or   ondansetron (ZOFRAN) injection 4 mg  4 mg Intravenous Q6H PRN Cherylann Ratel A, DO       Oral care mouth rinse  15 mL Mouth Rinse PRN Marylyn Ishihara, [name] A, DO       polyethylene glycol (MIRALAX / GLYCOLAX) packet 17 g  17 g Oral Daily PRN Allie Bossier, MD   17 g at 02/26/22 1130   rosuvastatin (CRESTOR) tablet 10 mg  10 mg Oral QHS Kyle, [name] A, DO   10 mg at 02/27/22 2201     Discharge Medications: Please see discharge summary for a list of discharge medications.  Relevant Imaging Results:  Relevant Lab Results:   Additional Information SSN: [id number]  Sherie Don, LCSW

## 2022-02-28 NOTE — TOC Initial Note (Signed)
Transition of Care [facility]) - Initial/Assessment Note   Patient Details  Name: Nicholas [name] MRN: [id number] Date of Birth: 05-Sep-1945  Transition of Care [facility]) CM/SW Contact:    Sherie Don, LCSW Phone Number: 02/28/2022, 2:12 PM  Clinical Narrative: PT and OT evaluations recommended SNF. CSW spoke with patient's wife regarding recommendations. CSW explained to wife that patient will be faxed out and insurance authorization started today, but there is a possibility that insurance may not approve the patient for SNF as patient is non-ambulatory at baseline.  FL2 done; PASRR confirmed. Initial referral faxed out. CSW called [facility] and spoke with Marlowe Kays to start insurance authorization. TOC awaiting bed offers and insurance approval.  Expected Discharge Plan: Skilled Nursing Facility Barriers to Discharge: SNF Pending bed offer, Insurance Authorization  Patient Goals and CMS Choice Patient states their goals for this hospitalization and ongoing recovery are:: Go to rehab if approved CMS Medicare.gov Compare Post Acute Care list provided to:: Patient Represenative (must comment) Choice offered to / list presented to : Spouse  Expected Discharge Plan and Services Expected Discharge Plan: [place] In-house Referral: Clinical Social Work Post Acute Care Choice: [place] Living arrangements for the past 2 months: [place]             DME Arranged: N/A DME Agency: NA  Prior Living Arrangements/Services Living arrangements for the past 2 months: Single Family Home Lives with:: Spouse Patient language and need for interpreter reviewed:: Yes Do you feel safe going back to the place where you live?: Yes      Need for Family Participation in Patient Care: Yes (Comment) Care giver support system in place?: Yes (comment) Criminal Activity/Legal Involvement Pertinent to Current Situation/Hospitalization: No - Comment as needed  Activities of Daily  Living Home Assistive Devices/Equipment: Wheelchair, Environmental consultant (specify type), Cane (specify quad or straight), Eyeglasses (electric wheel chair, steady) ADL Screening (condition at time of admission) Patient's cognitive ability adequate to safely complete daily activities?: Yes Is the patient deaf or have difficulty hearing?: No Does the patient have difficulty seeing, even when wearing glasses/contacts?: No Does the patient have difficulty concentrating, remembering, or making decisions?: No Patient able to express need for assistance with ADLs?: Yes Does the patient have difficulty dressing or bathing?: Yes Independently performs ADLs?: No Communication: Independent Dressing (OT): Needs assistance Is this a change from baseline?: Pre-admission baseline Grooming: Independent Feeding: Independent Bathing: Needs assistance Is this a change from baseline?: Pre-admission baseline Toileting: Needs assistance Is this a change from baseline?: Pre-admission baseline In/Out Bed: Needs assistance Is this a change from baseline?: Pre-admission baseline Walks in Home: Dependent Is this a change from baseline?: Pre-admission baseline Does the patient have difficulty walking or climbing stairs?: Yes Weakness of Legs: Both Weakness of Arms/Hands: Both  Permission Sought/Granted Permission sought to share information with : Chartered certified accountant granted to share information with : Yes, Verbal Permission Granted Permission granted to share info w AGENCY: SNFs  Emotional Assessment Orientation: : Oriented to Self, Oriented to Place, Oriented to  Time, Oriented to Situation Alcohol / Substance Use: Not Applicable Psych Involvement: No (comment)  Admission diagnosis:  Sepsis ([place]) [A41.9] Severe comorbid illness [R69] Sepsis, due to unspecified organism, unspecified whether acute organ dysfunction present [facility]) [A41.9] Patient Active Problem List   Diagnosis Date Noted    Severe sepsis ([place]) 02/24/2022   Obesity (BMI 35.0-39.9 without comorbidity) 02/24/2022   Pressure injury of buttock, stage 2 ([place]) 02/24/2022   Sepsis (  [place]) 02/23/2022   Prediabetes 02/23/2022   Parkinson's disease ([place]) 01/11/2021   Sepsis without acute organ dysfunction ([place])    Cellulitis 04/12/2020   Strain of right biceps 09/12/2017   Lymphedema 02/10/2017   BPH associated with nocturia 05/16/2016   Senile purpura ([place]) 05/03/2016   OSA (obstructive sleep apnea) 12/28/2015   Hypersomnia 11/15/2015   Cough 11/15/2015   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 left leg 03/09/2009   Insomnia 10/03/2007   Hyperlipidemia 12/13/2006   Essential hypertension 12/13/2006   PCP:  [name], MD Pharmacy:   [place], [place] [address] Phone: [phone] Fax: [phone]  [id number] [place], [address] AT [address] [place] [address] Phone: [phone] Fax: [phone]  Readmission Risk Interventions     No data to display

## 2022-02-28 NOTE — Progress Notes (Signed)
Placed pt on cpap 

## 2022-02-28 NOTE — Evaluation (Signed)
Occupational Therapy Evaluation Patient Details Name: Nicholas [name] MRN: [id number] DOB: 09-18-1945 Today's Date: 02/28/2022   History of Present Illness 76 yo male admitted with sepsis, weakness, CHF. Parkinson's plus syndrome, lymphedema, obesity, chf, melanoma s/p toe amp   Clinical Impression   Patient is currently requiring assistance with ADLs including up to total assist with Lower body ADLs at EOB or bed level, up to moderate assist with Upper body ADLs,  as well as  moderate assist +2 people with bed mobility and up to Max assist assist with functional transfers to toilet due to pt with increased difficulty initiating stand to sit motion from Nicholas [name] which pt reports is a baseline issue for him.  Current level of function may close to patient's typical baseline.  During this evaluation, patient was limited by generalized weakness, impaired activity tolerance with wheezing on light exertion, and morbid obesity, all of which has the potential to impact patient's safety and independence during functional mobility, as well as performance for ADLs.  Patient lives with his spouse, who along with 7x/wk caregivers for 4-5 hours at a time is able to provide 24/7 supervision and some level of assistance, however pt reports that his wife would not be able to sufficiently care for him with his current level of function.  Patient demonstrates fair rehab potential, and should benefit from continued skilled occupational therapy services while in acute care to maximize safety, independence and quality of life at home.  Continued occupational therapy services in a SNF setting prior to return home is recommended.     Recommendations for follow up therapy are one component of a multi-disciplinary discharge planning process, led by the attending physician.  Recommendations may be updated based on patient status, additional functional criteria and insurance authorization.   Follow Up Recommendations  Skilled  nursing-short term rehab (<3 hours/day)    Assistance Recommended at Discharge Frequent or constant Supervision/Assistance  Patient can return home with the following Two people to help with walking and/or transfers;Assistance with cooking/housework;Assist for transportation;Help with stairs or ramp for entrance;Assistance with feeding;Two people to help with bathing/dressing/bathroom;A lot of help with walking and/or transfers;A lot of help with bathing/dressing/bathroom;Direct supervision/assist for medications management;Direct supervision/assist for financial management    Functional Status Assessment  Patient has had a recent decline in their functional status and demonstrates the ability to make significant improvements in function in a reasonable and predictable amount of time.  Equipment Recommendations   (TBD)    Recommendations for Other Services       Precautions / Restrictions Precautions Precautions: Fall Precaution Comments: Freezes up with stand->Sit transitions basline Restrictions Weight Bearing Restrictions: No      Mobility Bed Mobility Overal bed mobility: Needs Assistance Bed Mobility: Supine to Sit     Supine to sit: Mod assist, +2 for physical assistance, +2 for safety/equipment, HOB elevated     General bed mobility comments: Assist for trunk and bil LEs. Utilized bedpad for scooting, positioning. Increased time. At baseline, pt does not sleep in a bed-sleeps in a lift chair    Transfers                          Balance Overall balance assessment: Needs assistance Sitting-balance support: Bilateral upper extremity supported, Feet supported Sitting balance-Nicholas [name] Scale: Fair     Standing balance support: Bilateral upper extremity supported Standing balance-Nicholas [name] Scale: Fair Standing balance comment: with use of STEDY  ADL either performed or assessed with clinical judgement   ADL Overall ADL's :  Needs assistance/impaired Eating/Feeding: Minimal assistance;Sitting   Grooming: Minimal assistance;Bed level   Upper Body Bathing: Moderate assistance;Sitting   Lower Body Bathing: Total assistance;Bed level;+2 for physical assistance   Upper Body Dressing : Moderate assistance;Sitting   Lower Body Dressing: Total assistance Lower Body Dressing Details (indicate cue type and reason): Total Assist to don socks at EOB. Pt able to kick feet forward to assist. Toilet Transfer: Min guard;Moderate assistance;Rolling walker (2 wheels) Toilet Transfer Details (indicate cue type and reason): To recliner: Pt stood from elevated EOB to Nicholas [name] with Min guard assist. Nicholas [name] used to tranfer pt to recliner. Pt cued to initiate sitting but pt unable to initiate movement for several minutes. Physical therapist present and performed gentle mobility at hips to facilitate hip flexion for sitting. Pt required Mod-Max As of 2 people to complete stand to sit. Total Assist of 2 for posterior scooting in recliner. Toileting- Clothing Manipulation and Hygiene: Total assistance;Bed level       Functional mobility during ADLs: Min guard;Moderate assistance;+2 for physical assistance;Maximal assistance General ADL Comments: Nicholas [name]     Vision Baseline Vision/History: 1 Wears glasses Ability to See in Adequate Light: 1 Impaired Additional Comments: Able to read clock on wall     Perception     Praxis      Pertinent Vitals/Pain Pain Assessment Pain Assessment: No/denies pain     Hand Dominance Right   Extremity/Trunk Assessment Upper Extremity Assessment Upper Extremity Assessment: Generalized weakness   Lower Extremity Assessment Lower Extremity Assessment: Generalized weakness   Cervical / Trunk Assessment Cervical / Trunk Assessment: Normal   Communication Communication Communication: No difficulties (Voice is soft)   Cognition Arousal/Alertness: Awake/alert Behavior During Therapy:  WFL for tasks assessed/performed Overall Cognitive Status: Within Functional Limits for tasks assessed                                 General Comments: Ox4     General Comments       Exercises     Shoulder Instructions      Home Living Family/patient expects to be discharged to:: Private residence Living Arrangements: Spouse/significant other Available Help at Discharge: Available 24 hours/day (Wife cannot assist due to poor health but can supervise) Type of Home: House Home Access: Level entry     Home Layout: One level     Bathroom Shower/Tub: Occupational psychologist: Handicapped height Bathroom Accessibility: Yes How Accessible: Accessible via wheelchair Home Equipment: Wheelchair - power;Grab bars - tub/shower;Grab bars - toilet;Shower seat;Hand held shower head   Additional Comments: Nicholas [name], Risk analyst.      Prior Functioning/Environment Prior Level of Function : Needs assist;History of Falls (last six months) (Endorse multiple falls from sliding out of lift recliner.)       Physical Assist : ADLs (physical)   ADLs (physical): IADLs;Bathing;Dressing;Toileting Mobility Comments: Non-ambulatory. Uses Stedy for all transfers. Has WC accessible Lucianne Lei and spouse drives. ADLs Comments: Has two caregivers who assist with bathing, dressing, transfers to bathroom and all IADLs. CGs assist with post-toilet hygiene or wife if CGs not available. Incontinent of bladder and weard Depends.  CGs everyday for 4-5 hours a day.        OT Problem List: Cardiopulmonary status limiting activity;Decreased strength;Impaired sensation;Decreased activity tolerance;Impaired tone;Impaired balance (sitting and/or standing);Decreased knowledge of use of DME  or AE;Obesity      OT Treatment/Interventions: Self-care/ADL training;Therapeutic exercise;Therapeutic activities;Neuromuscular education;Cognitive remediation/compensation;Energy conservation;Patient/family  education;DME and/or AE instruction;Balance training    OT Goals(Current goals can be found in the care plan section) Acute Rehab OT Goals Patient Stated Goal: To go to Rehab as pt reports that he needs to get stronger to go home with help that is available. OT Goal Formulation: With patient Time For Goal Achievement: 03/14/22 Potential to Achieve Goals: Fair ADL Goals Pt Will Perform Grooming: sitting;with min guard assist;with set-up (EOB with good balance) Pt Will Perform Upper Body Bathing: sitting;with set-up;with supervision Pt Will Transfer to Toilet: with min assist;bedside commode (Use of Stedy) Pt/caregiver will Perform Home Exercise Program: Both right and left upper extremity;Increased strength;With Supervision Additional ADL Goal #1: Pt will engage in 15 min functional activities without loss of sitting balance, in order to demonstrate improved activity tolerance and balance needed to perform ADLs safely at home.  OT Frequency: Min 2X/week    Co-evaluation              AM-PAC OT "6 Clicks" Daily Activity     Outcome Measure Help from another person eating meals?: A Little Help from another person taking care of personal grooming?: A Little Help from another person toileting, which includes using toliet, bedpan, or urinal?: Total Help from another person bathing (including washing, rinsing, drying)?: A Lot Help from another person to put on and taking off regular upper body clothing?: A Lot Help from another person to put on and taking off regular lower body clothing?: Total 6 Click Score: 12   End of Session Equipment Utilized During Treatment: Gait belt;Other (comment) Nurse Communication: Mobility status;Other (comment) (Use of Stedy. Difficulty with stand to sit)  Activity Tolerance: Patient tolerated treatment well Patient left: in chair;with call bell/phone within reach;with chair alarm set  OT Visit Diagnosis: Unsteadiness on feet (R26.81);Muscle weakness  (generalized) (M62.81);History of falling (Z91.81);Repeated falls (R29.6);Other symptoms and signs involving the nervous system (R29.898)                Time: 9518-8416 OT Time Calculation (min): 35 min Charges:  OT General Charges $OT Visit: 1 Visit OT Evaluation $OT Eval Moderate Complexity: 1 Mod  Alailah Safley, OT Acute Rehab Services Office: [phone] 02/28/2022  Julien Girt 02/28/2022, 1:30 PM

## 2022-02-28 NOTE — Progress Notes (Signed)
PROGRESS NOTE    Nicholas [name]  YIR:[id number] DOB: 1946/03/08 DOA: 02/23/2022 PCP: [name], MD     Brief Narrative:  Nicholas [name] is a 76 y.o. WM PMHx  HTN, HLD, Parkinson's Plus syndrome, pre-diabetes.   Presenting with weakness. History is from wife at bedside. She reports that the patient was in his normal state of health until last night. She noticed that he was wheezy and appeared weak. He was having difficulty steadying himself in a shower bench and was unable to get out. She called EMS. They assisted the patient out and helped him to bed. This morning, she noticed that her husband was even more weak. He was having difficulty moving from bed and he generally seemed out of it. She called EMS. When they arrived it was noted that he was hypotensive and tachy. So he was brought to the ED for evaluation. He denies any N/V/D or sick contacts. He had subjective fever at home this morning. He denies any other aggravating or alleviating factors.     Subjective: 8/8 afebrile overnight, ,   Assessment & Plan: Covid vaccination;   Principal Problem:   Sepsis ([place]) Active Problems:   History of malignant melanoma. left leg   Hyperlipidemia   Essential hypertension   Parkinson's plus syndrome (HCC)   OSA (obstructive sleep apnea)   BPH associated with nocturia   Lymphedema   Prediabetes   Severe sepsis (HCC)   Obesity (BMI 35.0-39.9 without comorbidity)   Pressure injury of buttock, stage 2 (HCC)  Severe sepsis -On admission make criteria for SIRS RR> 20, WBC> 12 K, temp> 38 C.  Lactic acid> 2 - Cultures pending - Continue current antibiotics - 8/4 trend lactic acid/procalcitonin  Latest Reference Range & Units 02/23/22 11:05 02/23/22 13:34 02/23/22 15:43 02/23/22 18:41  Lactic Acid, Venous 0.5 - 1.9 mmol/L 2.5 (HH) 6.0 (HH) 1.5 1.1  ([place]): Data is critically high  Latest Reference Range & Units 02/24/22 02:29 02/24/22 13:49 02/25/22 03:02  Procalcitonin ng/mL 1.30  0.80 0.66  -8/7 complete 7-day course antibiotics sepsis unknown cause.  Parkinson's plus syndrome - Sinemet 25-100 mg 2 tablets QID    Chronic lymphedema -Per patient wears compression stockings at home.  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 -Per EMR wife reports that he legs -- from an edema standpoint -- look very good; she notes that his erythema comes and goes; not sure that this is a cellulitis component, but the above abx would be covering it -8/4 Per patient stable  Acute diastolic CHF - 8/6 echocardiogram consistent with diastolic CHF see below -Strict in and out -6.0 L - Daily weight Filed Weights   02/23/22 1050 02/23/22 1600 02/28/22 0436  Weight: 99.8 kg 105.6 kg 99.5 kg  -8/5 Lasix IV 80 mg -8/5 Lisinopril 2.5 mg daily -8/6 Lasix IV 80 mg daily -8/8 Zaroxolyn 5 mg daily x 2 days   Essential HTN - See CHF     HLD - 8/6 LDL= 45: Within goal - Crestor 10 mg daily   OSA -CPAP per respiratory qhs -8/5 counseled patient on the importance of using CPAP while hospitalized.  Patient agreed - 8/5 counseled that even though he is sleeping in a chair now upon discharge should arrange for outpatient sleep study. -8/6 patient again agreed to use CPAP at night.   BPH -Not  on any home medication.   Overactive bladder - Gemtesa 75 mg qhs   Hyperglycemia/Prediabetes -5/11 Hemoglobin A1c= 5.9: Meets criteria for prediabetes - 8/4 prior to discharge will discuss with patient starting Metformin  -8/6 change to Very Sensitive SSI  LEFT buttocks pressure injury Pressure Injury 02/23/22 Buttocks Left Deep Tissue Pressure Injury - Purple or maroon localized area of discolored intact skin or blood-filled blister due to damage of underlying soft tissue from pressure and/or shear. (Active)  02/23/22 1521  Location: Buttocks  Location Orientation: Left  Staging:  Deep Tissue Pressure Injury - Purple or maroon localized area of discolored intact skin or blood-filled blister due to damage of underlying soft tissue from pressure and/or shear.  Wound Description (Comments):   Present on Admission: Yes  Dressing Type Foam - Lift dressing to assess site every shift 02/28/22 0914     Pressure Injury 02/23/22 Buttocks Right Deep Tissue Pressure Injury - Purple or maroon localized area of discolored intact skin or blood-filled blister due to damage of underlying soft tissue from pressure and/or shear. (Active)  02/23/22 1522  Location: Buttocks  Location Orientation: Right  Staging: Deep Tissue Pressure Injury - Purple or maroon localized area of discolored intact skin or blood-filled blister due to damage of underlying soft tissue from pressure and/or shear.  Wound Description (Comments):   Present on Admission: Yes  Dressing Type Foam - Lift dressing to assess site every shift 02/28/22 0914  -8/4 wound care: Per wound care Sacral foam dressing in use, can continue as prophylactic measure. Off loading to buttocks by turning and repositioning very important to prevent PI development  Obesity (BMI36.46 kg/m.) -  Goals of care - 8/7 PT/OT consult: Patient with CHF and Parkinson's.  PT/OT recommending SNF - 8/8 TOC consult.   Evaluate for SNF     Mobility Assessment (last 72 hours)     Mobility Assessment     Row Name 02/28/22 1327 02/28/22 1319 02/28/22 0914 02/27/22 1934 02/27/22 1932   Does patient have an order for bedrest or is patient medically unstable -- -- No - Continue assessment No - Continue assessment No - Continue assessment   What is the highest level of mobility based on the progressive mobility assessment? Level 3 (Stands with assist) - Balance while standing  and cannot march in place  with STEDY Level 3 (Stands with assist) - Balance while standing  and cannot march in place  with STEDY Level 2 (Chairfast) - Balance while sitting on edge  of bed and cannot stand Level 2 (Chairfast) - Balance while sitting on edge of bed and cannot stand Level 2 (Chairfast) - Balance while sitting on edge of bed and cannot stand   Is the above level different from baseline mobility prior to current illness? -- -- No - Consider discontinuing PT/OT No - Consider discontinuing PT/OT No - Consider discontinuing PT/OT    Row Name 02/27/22 0830 02/26/22 2027         Does patient have an order for bedrest or is patient medically unstable No - Continue assessment No - Continue assessment      What is the highest level of mobility based on the progressive mobility assessment? Level 2 (Chairfast) - Balance while sitting on edge of bed and cannot stand Level 2 (Chairfast) - Balance while sitting on edge of bed and cannot stand      Is the above level different from baseline mobility prior to current illness? -- No - Consider discontinuing PT/OT  DVT prophylaxis: Lovenox Code Status: DNR Family Communication: 8/7 spoke with wife discussed plan of care all questions answered Status is: Inpatient    Dispo: The patient is from: Home              Anticipated d/c is to: Home              Anticipated d/c date is: > 3 days              Patient currently is not medically stable to d/c.      Consultants:    Procedures/Significant Events:  8/5 Echocardiogram; Left Ventricle: LVEF= 60 to 65%.   Left ventricular diastolic parameters are consistent with Grade I diastolic dysfunction (impaired  relaxation).    I have personally reviewed and interpreted all radiology studies and my findings are as above.  VENTILATOR SETTINGS:    Cultures 8/3 urine negative 8/3 blood left wrist NGTD 8/3 RIGHT forearm NGTD 8/3 MRSA by PCR negative    Antimicrobials: Anti-infectives (From admission, onward)    Start     Ordered Stop   02/24/22 1400  vancomycin (VANCOCIN) IVPB 1000 mg/200 mL premix  Status:  Discontinued         02/23/22 1338 02/24/22 1004   02/24/22 1400  vancomycin (VANCOREADY) IVPB 1250 mg/250 mL        02/24/22 1004 03/02/22 1359   02/23/22 2200  metroNIDAZOLE (FLAGYL) IVPB 500 mg        02/23/22 1524 03/02/22 2159   02/23/22 2000  ceFEPIme (MAXIPIME) 2 g in sodium chloride 0.9 % 100 mL IVPB        02/23/22 1338 03/02/22 1359   02/23/22 1130  ceFEPIme (MAXIPIME) 2 g in sodium chloride 0.9 % 100 mL IVPB        02/23/22 1121 02/23/22 1427   02/23/22 1130  metroNIDAZOLE (FLAGYL) IVPB 500 mg        02/23/22 1121 02/23/22 1336   02/23/22 1130  vancomycin (VANCOCIN) IVPB 1000 mg/200 mL premix  Status:  Discontinued        02/23/22 1121 02/23/22 1127   02/23/22 1130  vancomycin (VANCOREADY) IVPB 2000 mg/400 mL        02/23/22 1127 02/23/22 1444         Devices    LINES / TUBES:      Continuous Infusions:  ceFEPime (MAXIPIME) IV 2 g (02/28/22 0616)   metronidazole 500 mg (02/28/22 9449)     Objective: Vitals:   02/27/22 2053 02/28/22 0433 02/28/22 0436 02/28/22 1324  BP: 128/70 122/61  103/67  Pulse: 87 92  80  Resp: '16 14  18  '$ Temp: 98.5 F (36.9 C) 98.3 F (36.8 C)    TempSrc: Oral Oral    SpO2: 95% 93%  100%  Weight:   99.5 kg   Height:        Intake/Output Summary (Last 24 hours) at 02/28/2022 1340 Last data filed at 02/28/2022 1000 Gross per 24 hour  Intake 1010 ml  Output 1950 ml  Net -940 ml    Filed Weights   02/23/22 1050 02/23/22 1600 02/28/22 0436  Weight: 99.8 kg 105.6 kg 99.5 kg    Examination:  General:/A/O x4, No acute respiratory distress Eyes: negative scleral hemorrhage, negative anisocoria, negative icterus ENT: Negative Runny nose, negative gingival bleeding, Neck:  Negative scars, masses, torticollis, lymphadenopathy, JVD Lungs: Clear to auscultation bilaterally without wheezes or crackles Cardiovascular: Regular rate and rhythm without murmur gallop or rub normal S1  and S2 Abdomen: negative abdominal pain, nondistended, positive soft, bowel  sounds, no rebound, no ascites, no appreciable mass Extremities: bilateral lower extremity edema 2+ to mid thigh (improving) Skin: Negative rashes, lesions, ulcers Psychiatric:  Negative depression, negative anxiety, negative fatigue, negative mania  Central nervous system:  Cranial nerves II through XII intact, tongue/uvula midline, all extremities muscle strength 5/5, sensation intact throughout, negative dysarthria, negative expressive aphasia, negative receptive aphasia.  .     Data Reviewed: Care during the described time interval was provided by me .  I have reviewed this patient's available data, including medical history, events of note, physical examination, and all test results as part of my evaluation.  CBC: Recent Labs  Lab 02/23/22 1105 02/24/22 0229 02/25/22 0302 02/26/22 0305 02/27/22 0341 02/28/22 0419  WBC 24.3* 12.5* 8.9 7.1 7.6 9.8  NEUTROABS 22.3*  --  6.1 4.7 5.3 7.0  HGB 14.6 12.4* 12.7* 13.7 14.3 14.8  HCT 44.9 39.1 40.2 42.4 43.9 46.3  MCV 91.3 92.4 92.8 91.6 90.5 91.1  PLT 194 140* 142* 158 198 562    Basic Metabolic Panel: Recent Labs  Lab 02/24/22 0229 02/25/22 0302 02/26/22 0305 02/27/22 0341 02/28/22 0419  NA 138 139 140 139 142  K 3.9 4.0 3.6 3.5 3.9  CL 107 107 106 103 103  CO2 '25 24 25 28 28  '$ GLUCOSE 94 92 98 101* 104*  BUN '23 18 22 '$ 26* 28*  CREATININE 0.67 0.73 0.76 0.88 0.96  CALCIUM 8.1* 8.6* 8.9 8.9 9.1  MG  --  2.1 2.1 2.1 2.4  PHOS  --  2.5 3.6 3.4 3.1    GFR: Estimated Creatinine Clearance: 74.8 mL/min (by C-G formula based on SCr of 0.96 mg/dL). Liver Function Tests: Recent Labs  Lab 02/24/22 0229 02/25/22 0302 02/26/22 0305 02/27/22 0341 02/28/22 0419  AST 15 14* 12* 13* 21  ALT 5 7 '5 6 7  '$ ALKPHOS 47 49 53 54 58  BILITOT 0.7 0.6 0.6 0.7 0.8  PROT 5.6* 6.0* 6.3* 6.6 6.9  ALBUMIN 3.2* 3.3* 3.5 3.6 3.9    No results for input(s): "LIPASE", "AMYLASE" in the last 168 hours. No results for input(s): "AMMONIA" in  the last 168 hours. Coagulation Profile: Recent Labs  Lab 02/23/22 1200 02/24/22 0229  INR 1.3* 1.3*    Cardiac Enzymes: No results for input(s): "CKTOTAL", "CKMB", "CKMBINDEX", "TROPONINI" in the last 168 hours. BNP (last 3 results) No results for input(s): "PROBNP" in the last 8760 hours. HbA1C: No results for input(s): "HGBA1C" in the last 72 hours. CBG: Recent Labs  Lab 02/27/22 1941 02/27/22 2337 02/28/22 0428 02/28/22 0731 02/28/22 1158  GLUCAP 216* 101* 128* 117* 123*    Lipid Profile: Recent Labs    02/26/22 0305  CHOL 96  HDL 33*  LDLCALC 45  TRIG 91  CHOLHDL 2.9    Thyroid Function Tests: No results for input(s): "TSH", "T4TOTAL", "FREET4", "T3FREE", "THYROIDAB" in the last 72 hours. Anemia Panel: No results for input(s): "VITAMINB12", "FOLATE", "FERRITIN", "TIBC", "IRON", "RETICCTPCT" in the last 72 hours. Sepsis Labs: Recent Labs  Lab 02/23/22 1105 02/23/22 1334 02/23/22 1543 02/23/22 1841 02/24/22 0229 02/24/22 1349 02/25/22 0302 02/26/22 0305  PROCALCITON  --   --   --   --  1.30 0.80 0.66 0.38  LATICACIDVEN 2.5* 6.0* 1.5 1.1  --   --   --   --      Recent Results (from the past 240 hour(s))  Urine Culture  Status: None   Collection Time: 02/23/22 10:58 AM   Specimen: Urine, Clean Catch  Result Value Ref Range Status   Specimen Description   Final    URINE, CLEAN CATCH Performed at [facility], [place] [address], [place], [place] [address]    Special Requests   Final    NONE Performed at [facility], [place] [address], [place], [place] [address]    Culture   Final    NO GROWTH Performed at [facility], [place] [address] [place], [place] [address]    Report Status 02/26/2022 FINAL  Final  Culture, blood (Routine x 2)     Status: None   Collection Time: 02/23/22 11:05 AM   Specimen: BLOOD  Result Value Ref Range Status   Specimen Description   Final    BLOOD BLOOD LEFT  WRIST Performed at [facility] [address], [place], [place] [address]    Special Requests   Final    BOTTLES DRAWN AEROBIC AND ANAEROBIC Blood Culture adequate volume Performed at [facility] [address], [place], [place] [address]    Culture   Final    NO GROWTH 5 DAYS Performed at [facility], [place] [address], [place], [place] [address]    Report Status 02/28/2022 FINAL  Final  Culture, blood (Routine x 2)     Status: None   Collection Time: 02/23/22 11:10 AM   Specimen: BLOOD  Result Value Ref Range Status   Specimen Description   Final    BLOOD BLOOD RIGHT FOREARM Performed at [facility] [address], [place], [place] [address]    Special Requests   Final    BOTTLES DRAWN AEROBIC AND ANAEROBIC Blood Culture results may not be optimal due to an inadequate volume of blood received in culture bottles Performed at [facility] [address] [place], [place] [address]    Culture   Final    NO GROWTH 5 DAYS Performed at [facility], [place] [address] [place], [place] [address]    Report Status 02/28/2022 FINAL  Final  Resp Panel by RT-PCR (Flu A&B, Covid) Anterior Nasal Swab     Status: None   Collection Time: 02/23/22 12:08 PM   Specimen: Anterior Nasal Swab  Result Value Ref Range Status   SARS Coronavirus 2 by RT PCR NEGATIVE NEGATIVE Final    Comment: (NOTE) SARS-CoV-2 target nucleic acids are NOT DETECTED.  The SARS-CoV-2 RNA is generally detectable in upper respiratory specimens during the acute phase of infection. The lowest concentration of SARS-CoV-2 viral copies this assay can detect is 138 copies/mL. A negative result does not preclude SARS-Cov-2 infection and should not be used as the sole basis for treatment or other patient management decisions. A negative result may occur with  improper specimen collection/handling, submission of specimen other than  nasopharyngeal swab, presence of viral mutation(s) within the areas targeted by this assay, and inadequate number of viral copies(<138 copies/mL). A negative result must be combined with clinical observations, patient history, and epidemiological information. The expected result is Negative.  Fact Sheet for Patients:  EntrepreneurPulse.com.au  Fact Sheet for Healthcare Providers:  IncredibleEmployment.be  This test is no t yet approved or cleared by the [place] FDA and  has been authorized for detection and/or diagnosis of SARS-CoV-2 by FDA under an Emergency Use Authorization (EUA). This EUA will remain  in effect (meaning this test can  be used) for the duration of the COVID-19 declaration under Section 564(b)(1) of the Act, 21 U.S.C.section 360bbb-3(b)(1), unless the authorization is terminated  or revoked sooner.       Influenza A by PCR NEGATIVE NEGATIVE Final   Influenza B by PCR NEGATIVE NEGATIVE Final    Comment: (NOTE) The Xpert Xpress SARS-CoV-2/FLU/RSV plus assay is intended as an aid in the diagnosis of influenza from Nasopharyngeal swab specimens and should not be used as a sole basis for treatment. Nasal washings and aspirates are unacceptable for Xpert Xpress SARS-CoV-2/FLU/RSV testing.  Fact Sheet for Patients: EntrepreneurPulse.com.au  Fact Sheet for Healthcare Providers: IncredibleEmployment.be  This test is not yet approved or cleared by the [place] FDA and has been authorized for detection and/or diagnosis of SARS-CoV-2 by FDA under an Emergency Use Authorization (EUA). This EUA will remain in effect (meaning this test can be used) for the duration of the COVID-19 declaration under Section 564(b)(1) of the Act, 21 U.S.C. section 360bbb-3(b)(1), unless the authorization is terminated or revoked.  Performed at [facility], [place] [address], [place], [place] [address]   MRSA Next Gen by PCR, Nasal     Status: None   Collection Time: 02/23/22  4:49 PM   Specimen: Nasal Mucosa; Nasal Swab  Result Value Ref Range Status   MRSA by PCR Next Gen NOT DETECTED NOT DETECTED Final    Comment: (NOTE) The GeneXpert MRSA Assay (FDA approved for NASAL specimens only), is one component of a comprehensive MRSA colonization surveillance program. It is not intended to diagnose MRSA infection nor to guide or monitor treatment for MRSA infections. Test performance is not FDA approved in patients less than 81 years old. Performed at [facility] [place] [address] [place],  [address]          Radiology Studies: No results found.      Scheduled Meds:  carbidopa-levodopa  2 tablet Oral QID   Chlorhexidine Gluconate Cloth  6 each Topical Daily   enoxaparin (LOVENOX) injection  40 mg Subcutaneous Q24H   furosemide  80 mg Intravenous Daily   insulin aspart  0-6 Units Subcutaneous Q4H   lisinopril  2.5 mg Oral Daily   mirabegron ER  25 mg Oral QHS   rosuvastatin  10 mg Oral QHS   Continuous Infusions:  ceFEPime (MAXIPIME) IV 2 g (02/28/22 0616)   metronidazole 500 mg (02/28/22 0942)     LOS: 5 days    Time spent:40 min    Walta [place], Geraldo Docker, MD [facility]   If 7PM-7AM, please contact night-coverage 02/28/2022, 1:40 PM

## 2022-02-28 NOTE — Evaluation (Signed)
Physical Therapy Evaluation Patient Details Name: Nicholas [name] MRN: [id number] DOB: 05-10-1946 Today's Date: 02/28/2022  History of Present Illness  76 yo male admitted with sepsis, weakness, CHF. Parkinson's plus syndrome, lymphedema, obesity, chf, melanoma s/p toe amp  Clinical Impression  On eval, pt required Mod A +2 for bed mobility and Min A to stand using STEDY. Increased time to complete all mobility tasks. Pt easily becomes short of breath and wheezes with activity. Per pt report, he uses STEDY (with assistance from caregivers and/or his wife) to stand and transfer at baseline. However, pt is stating that he wishes to go to SNF for ST rehab. No family was present during this session. Will plan to follow pt during this hospital stay.      Recommendations for follow up therapy are one component of a multi-disciplinary discharge planning process, led by the attending physician.  Recommendations may be updated based on patient status, additional functional criteria and insurance authorization.  Follow Up Recommendations Skilled nursing-short term rehab (<3 hours/day) Can patient physically be transported by private vehicle: No    Assistance Recommended at Discharge Frequent or constant Supervision/Assistance  Patient can return home with the following  Two people to help with walking and/or transfers;Two people to help with bathing/dressing/bathroom;Assist for transportation;Help with stairs or ramp for entrance;Assistance with cooking/housework    Equipment Recommendations None recommended by PT  Recommendations for Other Services  OT consult    Functional Status Assessment Patient has had a recent decline in their functional status and/or demonstrates limited ability to make significant improvements in function in a reasonable and predictable amount of time     Precautions / Restrictions Precautions Precautions: Fall Precaution Comments: "Freezes up" with stand >Sit transitions  @ baseline Restrictions Weight Bearing Restrictions: No      Mobility  Bed Mobility Overal bed mobility: Needs Assistance Bed Mobility: Supine to Sit     Supine to sit: Mod assist, +2 for physical assistance, +2 for safety/equipment, HOB elevated     General bed mobility comments: Assist for trunk and bil LEs. Utilized bedpad for scooting, positioning. Increased time. At baseline, pt does not sleep in a bed-sleeps in a lift chair    Transfers Overall transfer level: Needs assistance   Transfers: Sit to/from Stand, Bed to chair/wheelchair/BSC Sit to Stand: Min assist, From elevated surface           General transfer comment: Used STEDY for sit to stand and transfer to recliner. Pt has the most difficulty with stand to sit-required external assist to facilitate hip flexion. Increased time. Cues provided. Transfer via Lift Equipment: Stedy  Ambulation/Gait               General Gait Details: NT-pt is nonambulatory at baseline  Science writer    Modified Rankin (Stroke Patients Only)       Balance Overall balance assessment: Needs assistance Sitting-balance support: Bilateral upper extremity supported, Feet supported Sitting balance-Leahy Scale: Fair     Standing balance support: Bilateral upper extremity supported Standing balance-Leahy Scale: Fair Standing balance comment: with use of STEDY                             Pertinent Vitals/Pain Pain Assessment Pain Assessment: No/denies pain    Home Living Family/patient expects to be discharged to:: Private residence Living Arrangements: Spouse/significant other Available Help at Discharge: Available  24 hours/day (Wife cannot assist due to poor health but can supervise) Type of Home: House Home Access: Level entry       Home Layout: One level Home Equipment: Wheelchair - power;Grab bars - tub/shower;Grab bars - toilet;Shower seat;Hand held shower  head Additional Comments: Denna Haggard, Risk analyst.    Prior Function Prior Level of Function : Needs assist;History of Falls (last six months) (Endorse multiple falls from sliding out of lift recliner.)       Physical Assist : ADLs (physical)   ADLs (physical): IADLs;Bathing;Dressing;Toileting Mobility Comments: Non-ambulatory. Uses Stedy for all transfers. Has WC accessible Lucianne Lei and spouse drives. ADLs Comments: Has two caregivers who assist with bathing, dressing, transfers to bathroom and all IADLs. CGs assist with post-toilet hygiene or wife if CGs not available. Incontinent of bladder and weard Depends.  CGs everyday for 4-5 hours a day.     Hand Dominance   Dominant Hand: Right    Extremity/Trunk Assessment   Upper Extremity Assessment Upper Extremity Assessment: Generalized weakness    Lower Extremity Assessment Lower Extremity Assessment: Generalized weakness    Cervical / Trunk Assessment Cervical / Trunk Assessment: Normal  Communication   Communication: No difficulties (Voice is soft)  Cognition Arousal/Alertness: Awake/alert Behavior During Therapy: WFL for tasks assessed/performed Overall Cognitive Status: Within Functional Limits for tasks assessed                                          General Comments      Exercises     Assessment/Plan    PT Assessment Patient needs continued PT services  PT Problem List Decreased strength;Decreased mobility;Decreased activity tolerance;Decreased balance;Decreased knowledge of use of DME       PT Treatment Interventions DME instruction;Gait training;Therapeutic activities;Therapeutic exercise;Patient/family education;Balance training;Functional mobility training    PT Goals (Current goals can be found in the Care Plan section)  Acute Rehab PT Goals Patient Stated Goal: rehab PT Goal Formulation: With patient Time For Goal Achievement: 03/14/22 Potential to Achieve Goals: Poor     Frequency Min 2X/week     Co-evaluation               AM-PAC PT "6 Clicks" Mobility  Outcome Measure Help needed turning from your back to your side while in a flat bed without using bedrails?: A Lot Help needed moving from lying on your back to sitting on the side of a flat bed without using bedrails?: A Lot Help needed moving to and from a bed to a chair (including a wheelchair)?: Total Help needed standing up from a chair using your arms (e.g., wheelchair or bedside chair)?: A Little Help needed to walk in hospital room?: Total Help needed climbing 3-5 steps with a railing? : Total 6 Click Score: 10    End of Session Equipment Utilized During Treatment: Gait belt Activity Tolerance: Patient tolerated treatment well Patient left: in chair;with call bell/phone within reach   PT Visit Diagnosis: Muscle weakness (generalized) (M62.81);Other symptoms and signs involving the nervous system (R29.898)    Time: 3149-7026 PT Time Calculation (min) (ACUTE ONLY): 36 min   Charges:   PT Evaluation $PT Eval Moderate Complexity: 1 Mod           Doreatha Massed, PT Acute Rehabilitation  Office: [phone] Pager: [phone]

## 2022-03-01 DIAGNOSIS — A419 Sepsis, unspecified organism: Secondary | ICD-10-CM | POA: Diagnosis not present

## 2022-03-01 DIAGNOSIS — R6510 Systemic inflammatory response syndrome (SIRS) of non-infectious origin without acute organ dysfunction: Secondary | ICD-10-CM

## 2022-03-01 LAB — CBC WITH DIFFERENTIAL/PLATELET
Abs Immature Granulocytes: 0.06 10*3/uL (ref 0.00–0.07)
Basophils Absolute: 0 10*3/uL (ref 0.0–0.1)
Basophils Relative: 0 %
Eosinophils Absolute: 0.5 10*3/uL (ref 0.0–0.5)
Eosinophils Relative: 5 %
HCT: 46 % (ref 39.0–52.0)
Hemoglobin: 14.7 g/dL (ref 13.0–17.0)
Immature Granulocytes: 1 %
Lymphocytes Relative: 15 %
Lymphs Abs: 1.4 10*3/uL (ref 0.7–4.0)
MCH: 28.9 pg (ref 26.0–34.0)
MCHC: 32 g/dL (ref 30.0–36.0)
MCV: 90.4 fL (ref 80.0–100.0)
Monocytes Absolute: 0.8 10*3/uL (ref 0.1–1.0)
Monocytes Relative: 8 %
Neutro Abs: 6.7 10*3/uL (ref 1.7–7.7)
Neutrophils Relative %: 71 %
Platelets: 193 10*3/uL (ref 150–400)
RBC: 5.09 MIL/uL (ref 4.22–5.81)
RDW: 14.2 % (ref 11.5–15.5)
WBC: 9.4 10*3/uL (ref 4.0–10.5)
nRBC: 0 % (ref 0.0–0.2)

## 2022-03-01 LAB — COMPREHENSIVE METABOLIC PANEL
ALT: 15 U/L (ref 0–44)
AST: 30 U/L (ref 15–41)
Albumin: 4 g/dL (ref 3.5–5.0)
Alkaline Phosphatase: 58 U/L (ref 38–126)
Anion gap: 12 (ref 5–15)
BUN: 27 mg/dL — ABNORMAL HIGH (ref 8–23)
CO2: 26 mmol/L (ref 22–32)
Calcium: 9.3 mg/dL (ref 8.9–10.3)
Chloride: 103 mmol/L (ref 98–111)
Creatinine, Ser: 0.92 mg/dL (ref 0.61–1.24)
GFR, Estimated: >60 mL/min (ref >60)
Glucose, Bld: 98 mg/dL (ref 70–99)
Potassium: 3.5 mmol/L (ref 3.5–5.1)
Sodium: 141 mmol/L (ref 135–145)
Total Bilirubin: 0.9 mg/dL (ref 0.3–1.2)
Total Protein: 6.6 g/dL (ref 6.5–8.1)

## 2022-03-01 LAB — GLUCOSE, CAPILLARY
Glucose-Capillary: 99 mg/dL (ref 70–99)
Glucose-Capillary: 107 mg/dL — ABNORMAL HIGH (ref 70–99)
Glucose-Capillary: 89 mg/dL (ref 70–99)
Glucose-Capillary: 139 mg/dL — ABNORMAL HIGH (ref 70–99)
Glucose-Capillary: 166 mg/dL — ABNORMAL HIGH (ref 70–99)

## 2022-03-01 LAB — MAGNESIUM: Magnesium: 2.4 mg/dL (ref 1.7–2.4)

## 2022-03-01 LAB — PHOSPHORUS: Phosphorus: 2.7 mg/dL (ref 2.5–4.6)

## 2022-03-01 MED ORDER — FUROSEMIDE 40 MG PO TABS
40.0000 mg | ORAL_TABLET | Freq: Every day | ORAL | Status: DC
  Administered 2022-03-01 – 2022-03-02 (×2): 40 mg via ORAL
  Filled 2022-03-01 (×2): qty 1

## 2022-03-01 MED ORDER — POTASSIUM CHLORIDE CRYS ER 20 MEQ PO TBCR
40.0000 meq | EXTENDED_RELEASE_TABLET | Freq: Once | ORAL | Status: AC
  Administered 2022-03-01: 40 meq via ORAL
  Filled 2022-03-01: qty 2

## 2022-03-01 NOTE — Progress Notes (Addendum)
PROGRESS NOTE    Nicholas [name]  OZH:[id number] DOB: [birth year] DOA: 02/23/2022 PCP: [name], MD   Brief Narrative: 76 year old with past medical history significant for hypertension, hyperlipidemia, Parkinson's plus syndrome prediabetes presented with weakness.  Patient was noted to be more weak and having wheezing.  He was having difficulty steadying  himself in a shower bench and was unable to get out.  EMS was called and on arrival patient was found to be hypotensive and tachycardic. He was found to have SIRS criteria.  He was treated with IV antibiotics for unknown etiology.  Has also been treated for acute diastolic heart failure exacerbation with IV Lasix. PT is recommending SNF, but this has been declined by insurance.   Assessment & Plan:   Principal Problem:   SIRS (systemic inflammatory response syndrome) (HCC) Active Problems:   History of malignant melanoma. left leg   Hyperlipidemia   Essential hypertension   Parkinson's plus syndrome (HCC)   OSA (obstructive sleep apnea)   BPH associated with nocturia   Lymphedema   Prediabetes   Obesity (BMI 35.0-39.9 without comorbidity)   Pressure injury of buttock, stage 2 ([place])   1-SIRS: Severe sepsis, unable to rule in.  No source for infection was identified. He was treated with IV antibiotics and he will complete 7 days course. He presented with lactic acidosis. Blood Cultures no growth today. UA negative.  Chest x ray negative for PNA.   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  Acute on chronic diastolic heart failure exacerbation. Currently -6 L Treated IV Lasix.  Will transition to oral Lasix today.  Hyperlipidemia: Continue with Crestor  OSA continue with CPAP  Overreactive bladder continue with  Gemtesa See below wound pressure documentation Pressure Injury 02/23/22 Buttocks Left Deep Tissue Pressure Injury - Purple or maroon localized area of discolored intact skin or blood-filled blister due to damage of underlying soft tissue from pressure and/or shear. (Active)  02/23/22 1521  Location: Buttocks  Location Orientation: Left  Staging: Deep Tissue Pressure Injury - Purple or maroon localized area of discolored intact skin or blood-filled blister due to damage of underlying soft tissue from pressure and/or shear.  Wound Description (Comments):   Present on Admission: Yes  Dressing Type Foam - Lift dressing to assess site every shift 03/01/22 0813     Pressure Injury 02/23/22 Buttocks Right Deep Tissue Pressure Injury - Purple or maroon localized area of discolored intact skin or blood-filled blister due to damage of underlying soft tissue from pressure and/or shear. (Active)  02/23/22 1522  Location: Buttocks  Location Orientation: Right  Staging: Deep Tissue Pressure Injury - Purple or maroon localized area of discolored intact skin or blood-filled blister due to damage of underlying soft tissue from pressure and/or shear.  Wound Description (Comments):   Present on Admission: Yes  Dressing Type Foam - Lift dressing to assess site every shift 03/01/22 0813      Estimated body mass index is 34.32 kg/m as calculated from the following:   Height as of this encounter: '5\' 7"'$  (1.702 m).   Weight as of this encounter: 99.4 kg.   DVT prophylaxis: Lovenox Code Status: DNR Family Communication: no family at bedside.  Disposition Plan:  Status is: Inpatient Remains inpatient appropriate because: awaiting safe discharge    Consultants:  None Procedures:  None  Antimicrobials:    Subjective: He is feeling ok. He uses stedy at home for mobility   Objective: Vitals:   02/28/22 2133 03/01/22 0403 03/01/22 0408 03/01/22 1316  BP: 120/62  125/71 122/70  Pulse: 85  89 75   Resp: '16  16 18  '$ Temp: 97.9 F (36.6 C)  97.9 F (36.6 C) (!) 97.5 F (36.4 C)  TempSrc: Oral  Oral Oral  SpO2: 94%  96% 98%  Weight:  99.4 kg    Height:        Intake/Output Summary (Last 24 hours) at 03/01/2022 1643 Last data filed at 03/01/2022 1400 Gross per 24 hour  Intake 704.63 ml  Output 1950 ml  Net -1245.37 ml   Filed Weights   02/23/22 1600 02/28/22 0436 03/01/22 0403  Weight: 105.6 kg 99.5 kg 99.4 kg    Examination:  General exam: Appears calm and comfortable  Respiratory system: Clear to auscultation. Respiratory effort normal. Cardiovascular system: S1 & S2 heard, RRR.  Gastrointestinal system: Abdomen is nondistended, soft and nontender. No organomegaly or masses felt. Normal bowel sounds heard. Central nervous system: Alert and oriented. N Extremities: Symmetric 5 x 5 power.  Data Reviewed: I have personally reviewed following labs and imaging studies  CBC: Recent Labs  Lab 02/25/22 0302 02/26/22 0305 02/27/22 0341 02/28/22 0419 03/01/22 0422  WBC 8.9 7.1 7.6 9.8 9.4  NEUTROABS 6.1 4.7 5.3 7.0 6.7  HGB 12.7* 13.7 14.3 14.8 14.7  HCT 40.2 42.4 43.9 46.3 46.0  MCV 92.8 91.6 90.5 91.1 90.4  PLT 142* 158 198 195 010   Basic Metabolic Panel: Recent Labs  Lab 02/25/22 0302 02/26/22 0305 02/27/22 0341 02/28/22 0419 03/01/22 0422  NA 139 140 139 142 141  K 4.0 3.6 3.5 3.9 3.5  CL 107 106 103 103 103  CO2 '24 25 28 28 26  '$ GLUCOSE 92 98 101* 104* 98  BUN 18 22 26* 28* 27*  CREATININE 0.73 0.76 0.88 0.96 0.92  CALCIUM 8.6* 8.9 8.9 9.1 9.3  MG 2.1 2.1 2.1 2.4 2.4  PHOS 2.5 3.6 3.4 3.1 2.7   GFR: Estimated Creatinine Clearance: 77.9 mL/min (by C-G formula based on SCr of 0.92 mg/dL). Liver Function Tests: Recent Labs  Lab 02/25/22 0302 02/26/22 0305 02/27/22 0341 02/28/22 0419 03/01/22 0422  AST 14* 12* 13* 21 30  ALT 7 '5 6 7 15  '$ ALKPHOS 49 53 54 58 58  BILITOT 0.6 0.6 0.7 0.8 0.9  PROT 6.0* 6.3* 6.6 6.9 6.6  ALBUMIN 3.3* 3.5 3.6 3.9  4.0   No results for input(s): "LIPASE", "AMYLASE" in the last 168 hours. No results for input(s): "AMMONIA" in the last 168 hours. Coagulation Profile: Recent Labs  Lab 02/23/22 1200 02/24/22 0229  INR 1.3* 1.3*   Cardiac Enzymes: No results for input(s): "CKTOTAL", "CKMB", "CKMBINDEX", "TROPONINI" in the last 168 hours. BNP (last 3 results) No results for input(s): "PROBNP" in the last 8760 hours. HbA1C: No results for input(s): "HGBA1C" in the last 72 hours. CBG: Recent Labs  Lab 02/28/22 2346 03/01/22 0357 03/01/22 0730 03/01/22 1136 03/01/22 1600  GLUCAP 107* 99 107* 89 139*   Lipid Profile: No results for input(s): "CHOL", "HDL", "LDLCALC", "TRIG", "CHOLHDL", "LDLDIRECT" in the last 72 hours. Thyroid Function Tests: No results for input(s): "TSH", "T4TOTAL", "FREET4", "T3FREE", "THYROIDAB" in the last 72 hours. Anemia Panel: No results for input(s): "VITAMINB12", "FOLATE", "FERRITIN", "TIBC", "IRON", "RETICCTPCT" in the last 72 hours. Sepsis Labs: Recent Labs  Lab 02/23/22 1105 02/23/22 1334 02/23/22 1543 02/23/22 1841 02/24/22 0229 02/24/22 1349 02/25/22 0302 02/26/22 0305  PROCALCITON  --   --   --   --  1.30 0.80 0.66 0.38  LATICACIDVEN 2.5* 6.0* 1.5 1.1  --   --   --   --     Recent Results (from the past 240 hour(s))  Urine Culture     Status: None   Collection Time: 02/23/22 10:58 AM   Specimen: Urine, Clean Catch  Result Value Ref Range Status   Specimen Description   Final    URINE, CLEAN CATCH Performed at [facility], [place] [address], [place], [place] [address]    Special Requests   Final    NONE Performed at [facility], [place] [address], [place], [place] [address]    Culture   Final    NO GROWTH Performed at [facility], [place] [address], [place], [place] [address]    Report Status 02/26/2022 FINAL  Final  Culture, blood (Routine x 2)     Status: None   Collection Time: 02/23/22 11:05 AM    Specimen: BLOOD  Result Value Ref Range Status   Specimen Description   Final    BLOOD BLOOD LEFT WRIST Performed at [place] [address] [place], [place] [address]    Special Requests   Final    BOTTLES DRAWN AEROBIC AND ANAEROBIC Blood Culture adequate volume Performed at [place] [address] [place], [place] [address]    Culture   Final    NO GROWTH 5 DAYS Performed at [facility], [place] [address], [place], [place] [address]    Report Status 02/28/2022 FINAL  Final  Culture, blood (Routine x 2)     Status: None   Collection Time: 02/23/22 11:10 AM   Specimen: BLOOD  Result Value Ref Range Status   Specimen Description   Final    BLOOD BLOOD RIGHT FOREARM Performed at [place] [address] [place], [place] [address]    Special Requests   Final    BOTTLES DRAWN AEROBIC AND ANAEROBIC Blood Culture results may not be optimal due to an inadequate volume of blood received in culture bottles Performed at [place] [address], [place], [place] [address]    Culture   Final    NO GROWTH 5 DAYS Performed at [facility], [place] [address] [place],  [address]    Report Status 02/28/2022 FINAL  Final  Resp Panel by RT-PCR (Flu A&B, Covid) Anterior Nasal Swab     Status: None   Collection Time: 02/23/22 12:08 PM   Specimen: Anterior Nasal Swab  Result Value Ref Range Status   SARS Coronavirus 2 by RT PCR NEGATIVE NEGATIVE Final    Comment: (NOTE) SARS-CoV-2 target nucleic acids are NOT DETECTED.  The SARS-CoV-2 RNA is generally detectable in upper respiratory specimens during the acute phase of infection. The lowest concentration of SARS-CoV-2 viral copies this assay can detect is 138 copies/mL. A negative result does not preclude SARS-Cov-2 infection and should not be used as the sole basis for treatment or other patient management decisions. A negative  result may occur with  improper specimen collection/handling, submission of specimen other than nasopharyngeal swab, presence of viral mutation(s) within the areas targeted by this assay, and inadequate number of viral copies(<138 copies/mL). A negative result must be combined with clinical observations,  patient history, and epidemiological information. The expected result is Negative.  Fact Sheet for Patients:  EntrepreneurPulse.com.au  Fact Sheet for Healthcare Providers:  IncredibleEmployment.be  This test is no t yet approved or cleared by the [place] FDA and  has been authorized for detection and/or diagnosis of SARS-CoV-2 by FDA under an Emergency Use Authorization (EUA). This EUA will remain  in effect (meaning this test can be used) for the duration of the COVID-19 declaration under Section 564(b)(1) of the Act, 21 U.S.C.section 360bbb-3(b)(1), unless the authorization is terminated  or revoked sooner.       Influenza A by PCR NEGATIVE NEGATIVE Final   Influenza B by PCR NEGATIVE NEGATIVE Final    Comment: (NOTE) The Xpert Xpress SARS-CoV-2/FLU/RSV plus assay is intended as an aid in the diagnosis of influenza from Nasopharyngeal swab specimens and should not be used as a sole basis for treatment. Nasal washings and aspirates are unacceptable for Xpert Xpress SARS-CoV-2/FLU/RSV testing.  Fact Sheet for Patients: EntrepreneurPulse.com.au  Fact Sheet for Healthcare Providers: IncredibleEmployment.be  This test is not yet approved or cleared by the [place] FDA and has been authorized for detection and/or diagnosis of SARS-CoV-2 by FDA under an Emergency Use Authorization (EUA). This EUA will remain in effect (meaning this test can be used) for the duration of the COVID-19 declaration under Section 564(b)(1) of the Act, 21 U.S.C. section 360bbb-3(b)(1), unless the authorization is  terminated or revoked.  Performed at [facility], [place] [address] [place], [place] [address]   MRSA Next Gen by PCR, Nasal     Status: None   Collection Time: 02/23/22  4:49 PM   Specimen: Nasal Mucosa; Nasal Swab  Result Value Ref Range Status   MRSA by PCR Next Gen NOT DETECTED NOT DETECTED Final    Comment: (NOTE) The GeneXpert MRSA Assay (FDA approved for NASAL specimens only), is one component of a comprehensive MRSA colonization surveillance program. It is not intended to diagnose MRSA infection nor to guide or monitor treatment for MRSA infections. Test performance is not FDA approved in patients less than 33 years old. Performed at [facility], [place] [address], [place], [place] [address]          Radiology Studies: No results found.      Scheduled Meds:  carbidopa-levodopa  2 tablet Oral QID   Chlorhexidine Gluconate Cloth  6 each Topical Daily   enoxaparin (LOVENOX) injection  40 mg Subcutaneous Q24H   furosemide  40 mg Oral Daily   insulin aspart  0-6 Units Subcutaneous Q4H   lisinopril  2.5 mg Oral Daily   mirabegron ER  25 mg Oral QHS   rosuvastatin  10 mg Oral QHS   Continuous Infusions:  ceFEPime (MAXIPIME) IV 2 g (03/01/22 1403)   metronidazole 500 mg (03/01/22 1046)     LOS: 6 days    Time spent: 35 minutes.     Elmarie Shiley, MD [facility]   If 7PM-7AM, please contact night-coverage [email or web address]  03/01/2022, 4:43 PM

## 2022-03-01 NOTE — TOC Progression Note (Signed)
Transition of Care [facility]) - Progression Note   Patient Details  Name: Nicholas [name] MRN: [id number] Date of Birth: 10-29-1945  Transition of Care [facility]) CM/SW [place], LCSW Phone Number: 03/01/2022, 11:22 AM  Clinical Narrative: CSW notified by [facility] that patient has been approved for [facility] transport (auth # [id number]), but was not approved for SNF. CSW notified hospitalist that a peer-to-peer can be done with Dr. Amalia Hailey with [facility] that is due by tomorrow at [place] to follow.  Expected Discharge Plan: Skilled Nursing Facility Barriers to Discharge: SNF Pending bed offer, Insurance Authorization  Expected Discharge Plan and Services Expected Discharge Plan: [place] In-house Referral: Clinical Social Work Post Acute Care Choice: [place] Living arrangements for the past 2 months: Single Family Home            DME Arranged: N/A DME Agency: NA  Readmission Risk Interventions     No data to display

## 2022-03-02 DIAGNOSIS — R6510 Systemic inflammatory response syndrome (SIRS) of non-infectious origin without acute organ dysfunction: Secondary | ICD-10-CM | POA: Diagnosis not present

## 2022-03-02 LAB — CBC WITH DIFFERENTIAL/PLATELET
Abs Immature Granulocytes: 0.08 10*3/uL — ABNORMAL HIGH (ref 0.00–0.07)
Basophils Absolute: 0 10*3/uL (ref 0.0–0.1)
Basophils Relative: 0 %
Eosinophils Absolute: 0.5 10*3/uL (ref 0.0–0.5)
Eosinophils Relative: 5 %
HCT: 47.7 % (ref 39.0–52.0)
Hemoglobin: 15.3 g/dL (ref 13.0–17.0)
Immature Granulocytes: 1 %
Lymphocytes Relative: 18 %
Lymphs Abs: 1.8 10*3/uL (ref 0.7–4.0)
MCH: 29.1 pg (ref 26.0–34.0)
MCHC: 32.1 g/dL (ref 30.0–36.0)
MCV: 90.9 fL (ref 80.0–100.0)
Monocytes Absolute: 0.8 10*3/uL (ref 0.1–1.0)
Monocytes Relative: 8 %
Neutro Abs: 6.9 10*3/uL (ref 1.7–7.7)
Neutrophils Relative %: 68 %
Platelets: 214 10*3/uL (ref 150–400)
RBC: 5.25 MIL/uL (ref 4.22–5.81)
RDW: 14.1 % (ref 11.5–15.5)
WBC: 10.1 10*3/uL (ref 4.0–10.5)
nRBC: 0 % (ref 0.0–0.2)

## 2022-03-02 LAB — COMPREHENSIVE METABOLIC PANEL
ALT: 17 U/L (ref 0–44)
AST: 31 U/L (ref 15–41)
Albumin: 4 g/dL (ref 3.5–5.0)
Alkaline Phosphatase: 54 U/L (ref 38–126)
Anion gap: 15 (ref 5–15)
BUN: 30 mg/dL — ABNORMAL HIGH (ref 8–23)
CO2: 24 mmol/L (ref 22–32)
Calcium: 9 mg/dL (ref 8.9–10.3)
Chloride: 98 mmol/L (ref 98–111)
Creatinine, Ser: 1.07 mg/dL (ref 0.61–1.24)
GFR, Estimated: >60 mL/min (ref >60)
Glucose, Bld: 106 mg/dL — ABNORMAL HIGH (ref 70–99)
Potassium: 3.9 mmol/L (ref 3.5–5.1)
Sodium: 137 mmol/L (ref 135–145)
Total Bilirubin: 0.8 mg/dL (ref 0.3–1.2)
Total Protein: 6.9 g/dL (ref 6.5–8.1)

## 2022-03-02 LAB — GLUCOSE, CAPILLARY
Glucose-Capillary: 102 mg/dL — ABNORMAL HIGH (ref 70–99)
Glucose-Capillary: 106 mg/dL — ABNORMAL HIGH (ref 70–99)
Glucose-Capillary: 107 mg/dL — ABNORMAL HIGH (ref 70–99)
Glucose-Capillary: 107 mg/dL — ABNORMAL HIGH (ref 70–99)
Glucose-Capillary: 134 mg/dL — ABNORMAL HIGH (ref 70–99)

## 2022-03-02 LAB — PHOSPHORUS: Phosphorus: 2.9 mg/dL (ref 2.5–4.6)

## 2022-03-02 LAB — MAGNESIUM: Magnesium: 2.3 mg/dL (ref 1.7–2.4)

## 2022-03-02 MED ORDER — POLYETHYLENE GLYCOL 3350 17 G PO PACK: 17.0000 g | PACK | Freq: Every day | ORAL | 0 refills | Status: DC | PRN

## 2022-03-02 MED ORDER — FUROSEMIDE 40 MG PO TABS: 40.0000 mg | ORAL_TABLET | Freq: Every day | ORAL | 0 refills | Status: DC

## 2022-03-02 MED ORDER — LISINOPRIL 2.5 MG PO TABS: 2.5000 mg | ORAL_TABLET | Freq: Every day | ORAL | 0 refills | Status: DC

## 2022-03-02 NOTE — TOC Transition Note (Signed)
Transition of Care [facility]) - CM/SW Discharge Note  Patient Details  Name: Nicholas [name] MRN: [id number] Date of Birth: 06/04/1946  [facility]) CM/SW Contact:  Sherie Don, LCSW Phone Number: 03/02/2022, 2:45 PM  Clinical Narrative: CSW notified by [facility] that SNF was denied. CSW provided denial letter to patient and patient is aware SNF was not approved. CSW made multiple attempts and left voicemails for wife regarding denial of rehab. Discharge packet for patient to go home via PTAR left with nurses station. TOC signing off.  Final next level of care: Home/Self Care Barriers to Discharge: Barriers Resolved  Patient Goals and CMS Choice Patient states their goals for this hospitalization and ongoing recovery are:: Go to rehab if approved CMS Medicare.gov Compare Post Acute Care list provided to:: Patient Represenative (must comment) Choice offered to / list presented to : Spouse  Discharge Plan and Services In-house Referral: Clinical Social Work Post Acute Care Choice: [place]          DME Arranged: N/A DME Agency: NA  Readmission Risk Interventions     No data to display

## 2022-03-02 NOTE — Progress Notes (Signed)
Patient is intermittently refusing his CPAP. He will place it on and then remove it periodically, saying "I can't breathe with it on." or "I don't want to wear it."  Patient has been educated and he still did not want his CPAP on. Will ask him once more later in the shift.

## 2022-03-02 NOTE — Progress Notes (Signed)
Assessment unchanged. AVS reviewed with wife earlier with verbal understanding. PT[facility] here to transport pt to home via ambulance.

## 2022-03-02 NOTE — Progress Notes (Signed)
Wife came by and ready to received pt at home. PTAR called.

## 2022-03-02 NOTE — Discharge Summary (Signed)
Physician Discharge Summary   Patient: Nicholas [name] MRN: [id number] DOB: Nov 26, 1945  Admit date:     02/23/2022  Discharge date: 03/02/22  Discharge Physician: Elmarie Shiley   PCP: [name], MD   Recommendations at discharge:    Continue with PT>  Monitor renal function.   Discharge Diagnoses: Principal Problem:   SIRS (systemic inflammatory response syndrome) (HCC) Active Problems:   History of malignant melanoma. left leg   Hyperlipidemia   Essential hypertension   Parkinson's plus syndrome (HCC)   OSA (obstructive sleep apnea)   BPH associated with nocturia   Lymphedema   Prediabetes   Obesity (BMI 35.0-39.9 without comorbidity)   Pressure injury of buttock, stage 2 (HCC)  Resolved Problems:   Sepsis ([place])   Severe sepsis [facility])  Hospital Course: 76 year old with past medical history significant for hypertension, hyperlipidemia, Parkinson's plus syndrome prediabetes presented with weakness.  Patient was noted to be more weak and having wheezing.  He was having difficulty steadying  himself in a shower bench and was unable to get out.  EMS was called and on arrival patient was found to be hypotensive and tachycardic. He was found to have SIRS criteria.  He was treated with IV antibiotics for unknown etiology.  Has also been treated for acute diastolic heart failure exacerbation with IV Lasix. PT is recommending SNF, but this has been declined by insurance.  Assessment and Plan: 1-SIRS: Severe sepsis, unable to rule in.  No source for infection was identified. He was treated with IV antibiotics and he Completed 7 days course antibiotics. Marland Kitchen He presented with lactic acidosis. Blood Cultures no growth  UA negative.  Chest x ray negative for PNA.  Stable for discharge.   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   Acute on chronic diastolic heart failure exacerbation. Currently -8 L Treated IV Lasix.  transition to oral Lasix 8/09  Started on low dose lisinopril.   Hyperlipidemia: Continue with Crestor   OSA continue with CPAP Functional deconditioning; Insurance denied rehab. He will be discharge [facility] with [facility].   Overreactive bladder continue with Gemtesa See below wound pressure documentation      Consultants: None Procedures performed: None Disposition: Home Diet recommendation:  Discharge Diet Orders (From admission, onward)     Start     Ordered   03/02/22 0000  Diet - low sodium heart healthy        03/02/22 1030           Cardiac diet DISCHARGE MEDICATION: Allergies as of 03/02/2022       Reactions   Amoxicillin Swelling   Presumed angioedema of lips         Medication List     TAKE these medications    acetaminophen 500 MG tablet Commonly known as: TYLENOL Take 1,000 mg by mouth every 6 (six) hours as needed for mild pain.   albuterol 108 (90 Base) MCG/ACT inhaler Commonly known as: VENTOLIN HFA Inhale 2 puffs into the lungs every 6 (six) hours as needed for wheezing or shortness of breath.   carbidopa-levodopa 25-100 MG tablet Commonly known as: SINEMET IR TAKE 2 TABLETS AT 7AM,11AM, 3PM, AND AT 7PM. What changed: See the new instructions.   furosemide 40 MG tablet Commonly known as: LASIX Take 1  tablet (40 mg total) by mouth daily. Start taking on: March 03, 2022   GEMTESA PO Take 75 mg by mouth at bedtime. Through urology   lisinopril 2.5 MG tablet Commonly known as: ZESTRIL Take 1 tablet (2.5 mg total) by mouth daily. Start taking on: March 03, 2022   polyethylene glycol 17 g packet Commonly known as: MIRALAX / GLYCOLAX Take 17 g by mouth daily as needed for moderate constipation.   rosuvastatin 10 MG tablet Commonly known as: CRESTOR TAKE ONE TABLET BY MOUTH DAILY What  changed: when to take this               Discharge Care Instructions  (From admission, onward)           Start     Ordered   03/02/22 0000  Discharge wound care:       Comments: See above   03/02/22 1030            Discharge Exam: Filed Weights   02/28/22 0436 03/01/22 0403 03/02/22 0403  Weight: 99.5 kg 99.4 kg 99 kg   General; NAD  Condition at discharge: stable  The results of significant diagnostics from this hospitalization (including imaging, microbiology, ancillary and laboratory) are listed below for reference.   Imaging Studies: ECHOCARDIOGRAM COMPLETE  Result Date: 02/25/2022    ECHOCARDIOGRAM REPORT   Patient Name:   [name] Date of Exam: 02/25/2022 Medical Rec #:  [id number]       Height:       67.0 in Accession #:    [id number]      Weight:       232.8 lb Date of Birth:  May 28, 1946       BSA:          2.157 m Patient Age:    76 years        BP:           151/76 mmHg Patient Gender: M               HR:           80 bpm. Exam Location:  Inpatient Procedure: 2D Echo, Cardiac Doppler and Color Doppler Indications:    CHF  History:        Patient has prior history of Echocardiogram examinations, most                 recent 02/26/2014. Risk Factors:Hypertension and Dyslipidemia.  Sonographer:    Jefferey Pica Referring Phys: [id number] [place]  1. Left ventricular ejection fraction, by estimation, is 60 to 65%. The left ventricle has normal function. The left ventricle has no regional wall motion abnormalities. There is mild concentric left ventricular hypertrophy. Left ventricular diastolic parameters are consistent with Grade I diastolic dysfunction (impaired relaxation).  2. Right ventricular systolic function is normal. The right ventricular size is normal.  3. The mitral valve is normal in structure. No evidence of mitral valve regurgitation. No evidence of mitral stenosis.  4. The aortic valve is tricuspid. Aortic valve regurgitation is not  visualized. No aortic stenosis is present.  5. The inferior vena cava is dilated in size with >50% respiratory variability, suggesting right atrial pressure of 8 mmHg. Comparison(s): Prior images unable to be directly viewed, comparison made by report only. FINDINGS  Left Ventricle: Left ventricular ejection fraction, by estimation, is 60 to 65%. The left ventricle has normal function. The left ventricle has no regional wall motion abnormalities. The left ventricular  internal cavity size was normal in size. There is  mild concentric left ventricular hypertrophy. Left ventricular diastolic parameters are consistent with Grade I diastolic dysfunction (impaired relaxation). Right Ventricle: The right ventricular size is normal. Right ventricular systolic function is normal. Left Atrium: Left atrial size was normal in size. Right Atrium: Right atrial size was normal in size. Pericardium: There is no evidence of pericardial effusion. Mitral Valve: The mitral valve is normal in structure. No evidence of mitral valve regurgitation. No evidence of mitral valve stenosis. Tricuspid Valve: The tricuspid valve is normal in structure. Tricuspid valve regurgitation is trivial. No evidence of tricuspid stenosis. Aortic Valve: The aortic valve is tricuspid. Aortic valve regurgitation is not visualized. No aortic stenosis is present. Aortic valve peak gradient measures 6.3 mmHg. Pulmonic Valve: The pulmonic valve was normal in structure. Pulmonic valve regurgitation is not visualized. No evidence of pulmonic stenosis. Aorta: The aortic [place] is normal in size and structure. Venous: The inferior vena cava is dilated in size with greater than 50% respiratory variability, suggesting right atrial pressure of 8 mmHg. IAS/Shunts: No atrial level shunt detected by color flow Doppler.  LEFT VENTRICLE PLAX 2D LVIDd:         3.20 cm   Diastology LVIDs:         2.20 cm   LV e' medial:    6.62 cm/s LV PW:         1.40 cm   LV E/e' medial:  9.3  LV IVS:        1.30 cm   LV e' lateral:   7.75 cm/s LVOT diam:     2.00 cm   LV E/e' lateral: 7.9 LV SV:         61 LV SV Index:   28 LVOT Area:     3.14 cm  RIGHT VENTRICLE             IVC RV Basal diam:  3.00 cm     IVC diam: 2.30 cm RV S prime:     15.60 cm/s TAPSE (M-mode): 2.3 cm LEFT ATRIUM             Index        RIGHT ATRIUM           Index LA diam:        3.50 cm 1.62 cm/m   RA Area:     13.00 cm LA Vol (A2C):   50.9 ml 23.60 ml/m  RA Volume:   29.20 ml  13.54 ml/m LA Vol (A4C):   38.5 ml 17.85 ml/m LA Biplane Vol: 48.4 ml 22.44 ml/m  AORTIC VALVE                 PULMONIC VALVE AV Area (Vmax): 2.55 cm     PV Vmax:       1.06 m/s AV Vmax:        125.50 cm/s  PV Peak grad:  4.5 mmHg AV Peak Grad:   6.3 mmHg LVOT Vmax:      102.00 cm/s LVOT Vmean:     57.000 cm/s LVOT VTI:       0.194 m  AORTA Ao [place] diam: 3.80 cm Ao Asc diam:  3.70 cm MITRAL VALVE MV Area (PHT): 4.39 cm    SHUNTS MV Decel Time: 173 msec    Systemic VTI:  0.19 m MV E velocity: 61.60 cm/s  Systemic Diam: 2.00 cm MV A velocity: 85.40 cm/s MV E/A ratio:  0.72 Aaron Edelman  Crenshaw MD Electronically signed by Kirk Ruths MD Signature Date/Time: 02/25/2022/11:14:14 AM    Final    DG Chest Portable 1 View  Result Date: 02/23/2022 CLINICAL DATA:  Wheezing EXAM: PORTABLE CHEST 1 VIEW COMPARISON:  11/04/2020 FINDINGS: Cardiomegaly. Low volume examination. Both lungs are clear. The visualized skeletal structures are unremarkable. IMPRESSION: Cardiomegaly without acute abnormality of the lungs in low volume AP portable projection. Electronically Signed   By: Delanna Ahmadi M.D.   On: 02/23/2022 11:34    Microbiology: Results for orders placed or performed during the hospital encounter of 02/23/22  Urine Culture     Status: None   Collection Time: 02/23/22 10:58 AM   Specimen: Urine, Clean Catch  Result Value Ref Range Status   Specimen Description   Final    URINE, CLEAN CATCH Performed at [facility], [place] [address] [place], [place] [address]    Special Requests   Final    NONE Performed at [facility], [place] [address] [place], [place] [address]    Culture   Final    NO GROWTH Performed at [facility], [place] [address] [place], [place] [address]    Report Status 02/26/2022 FINAL  Final  Culture, blood (Routine x 2)     Status: None   Collection Time: 02/23/22 11:05 AM   Specimen: BLOOD  Result Value Ref Range Status   Specimen Description   Final    BLOOD BLOOD LEFT WRIST Performed at [place] [address], [place], [place] [address]    Special Requests   Final    BOTTLES DRAWN AEROBIC AND ANAEROBIC Blood Culture adequate volume Performed at [place] [address], [place], [place] [address]    Culture   Final    NO GROWTH 5 DAYS Performed at [facility], [place] [address] [place], [place] [address]    Report Status 02/28/2022 FINAL  Final  Culture, blood (Routine x 2)     Status: None   Collection Time: 02/23/22 11:10 AM   Specimen: BLOOD  Result Value Ref Range Status   Specimen Description   Final    BLOOD BLOOD RIGHT FOREARM Performed at [place] [address], [place], [place] [address]    Special Requests   Final    BOTTLES DRAWN AEROBIC AND ANAEROBIC Blood Culture results may not be optimal due to an inadequate volume of blood received in culture bottles Performed at [place] [address] [place], [place] [address]    Culture   Final    NO GROWTH 5 DAYS Performed at [facility], [place] [address], [place], [place] [address]    Report Status 02/28/2022 FINAL  Final  Resp Panel by RT-PCR (Flu A&B, Covid) Anterior Nasal Swab     Status: None   Collection Time: 02/23/22 12:08 PM   Specimen: Anterior Nasal Swab  Result Value Ref Range Status   SARS Coronavirus 2 by RT PCR NEGATIVE NEGATIVE Final    Comment: (NOTE) SARS-CoV-2 target nucleic  acids are NOT DETECTED.  The SARS-CoV-2 RNA is generally detectable in upper respiratory specimens during the acute phase of infection. The lowest concentration of SARS-CoV-2 viral copies this assay can detect is 138 copies/mL. A negative result does not preclude SARS-Cov-2 infection and should not be used as the sole basis for treatment or other patient management decisions. A negative result may occur with  improper  specimen collection/handling, submission of specimen other than nasopharyngeal swab, presence of viral mutation(s) within the areas targeted by this assay, and inadequate number of viral copies(<138 copies/mL). A negative result must be combined with clinical observations, patient history, and epidemiological information. The expected result is Negative.  Fact Sheet for Patients:  EntrepreneurPulse.com.au  Fact Sheet for Healthcare Providers:  IncredibleEmployment.be  This test is no t yet approved or cleared by the [place] FDA and  has been authorized for detection and/or diagnosis of SARS-CoV-2 by FDA under an Emergency Use Authorization (EUA). This EUA will remain  in effect (meaning this test can be used) for the duration of the COVID-19 declaration under Section 564(b)(1) of the Act, 21 U.S.C.section 360bbb-3(b)(1), unless the authorization is terminated  or revoked sooner.       Influenza A by PCR NEGATIVE NEGATIVE Final   Influenza B by PCR NEGATIVE NEGATIVE Final    Comment: (NOTE) The Xpert Xpress SARS-CoV-2/FLU/RSV plus assay is intended as an aid in the diagnosis of influenza from Nasopharyngeal swab specimens and should not be used as a sole basis for treatment. Nasal washings and aspirates are unacceptable for Xpert Xpress SARS-CoV-2/FLU/RSV testing.  Fact Sheet for Patients: EntrepreneurPulse.com.au  Fact Sheet for Healthcare Providers: IncredibleEmployment.be  This  test is not yet approved or cleared by the [place] FDA and has been authorized for detection and/or diagnosis of SARS-CoV-2 by FDA under an Emergency Use Authorization (EUA). This EUA will remain in effect (meaning this test can be used) for the duration of the COVID-19 declaration under Section 564(b)(1) of the Act, 21 U.S.C. section 360bbb-3(b)(1), unless the authorization is terminated or revoked.  Performed at [facility], [place] [address] [place], [place] [address]   MRSA Next Gen by PCR, Nasal     Status: None   Collection Time: 02/23/22  4:49 PM   Specimen: Nasal Mucosa; Nasal Swab  Result Value Ref Range Status   MRSA by PCR Next Gen NOT DETECTED NOT DETECTED Final    Comment: (NOTE) The GeneXpert MRSA Assay (FDA approved for NASAL specimens only), is one component of a comprehensive MRSA colonization surveillance program. It is not intended to diagnose MRSA infection nor to guide or monitor treatment for MRSA infections. Test performance is not FDA approved in patients less than 50 years old. Performed at [facility], [place] [address] [place], [place] [address]     Labs: CBC: Recent Labs  Lab 02/26/22 0305 02/27/22 0341 02/28/22 0419 03/01/22 0422 03/02/22 0426  WBC 7.1 7.6 9.8 9.4 10.1  NEUTROABS 4.7 5.3 7.0 6.7 6.9  HGB 13.7 14.3 14.8 14.7 15.3  HCT 42.4 43.9 46.3 46.0 47.7  MCV 91.6 90.5 91.1 90.4 90.9  PLT 158 198 195 193 094   Basic Metabolic Panel: Recent Labs  Lab 02/26/22 0305 02/27/22 0341 02/28/22 0419 03/01/22 0422 03/02/22 0426  NA 140 139 142 141 137  K 3.6 3.5 3.9 3.5 3.9  CL 106 103 103 103 98  CO2 '25 28 28 26 24  '$ GLUCOSE 98 101* 104* 98 106*  BUN 22 26* 28* 27* 30*  CREATININE 0.76 0.88 0.96 0.92 1.07  CALCIUM 8.9 8.9 9.1 9.3 9.0  MG 2.1 2.1 2.4 2.4 2.3  PHOS 3.6 3.4 3.1 2.7 2.9   Liver Function Tests: Recent Labs  Lab 02/26/22 0305 02/27/22 0341 02/28/22 0419 03/01/22 0422  03/02/22 0426  AST 12* 13* '21 30 31  '$ ALT '5 6 7 15 17  '$ ALKPHOS  53 54 58 58 54  BILITOT 0.6 0.7 0.8 0.9 0.8  PROT 6.3* 6.6 6.9 6.6 6.9  ALBUMIN 3.5 3.6 3.9 4.0 4.0   CBG: Recent Labs  Lab 03/01/22 1600 03/01/22 1918 03/02/22 0003 03/02/22 0400 03/02/22 0731  GLUCAP 139* 166* 106* 107* 102*    Discharge time spent: greater than 30 minutes.  Signed: Elmarie Shiley, MD [facility] 03/02/2022

## 2022-03-03 ENCOUNTER — Other Ambulatory Visit: Payer: Self-pay

## 2022-03-03 ENCOUNTER — Emergency Department (HOSPITAL_COMMUNITY): Payer: PPO

## 2022-03-03 ENCOUNTER — Encounter (HOSPITAL_COMMUNITY): Payer: Self-pay

## 2022-03-03 ENCOUNTER — Emergency Department (HOSPITAL_COMMUNITY)
Admission: EM | Admit: 2022-03-03 | Discharge: 2022-03-03 | Disposition: A | Discharge status: Home / Self Care | Payer: PPO | Attending: Emergency Medicine | Admitting: Emergency Medicine

## 2022-03-03 DIAGNOSIS — R55 Syncope and collapse: Secondary | ICD-10-CM | POA: Insufficient documentation

## 2022-03-03 DIAGNOSIS — I469 Cardiac arrest, cause unspecified: Secondary | ICD-10-CM | POA: Diagnosis not present

## 2022-03-03 DIAGNOSIS — K5730 Diverticulosis of large intestine without perforation or abscess without bleeding: Secondary | ICD-10-CM | POA: Diagnosis not present

## 2022-03-03 DIAGNOSIS — R531 Weakness: Secondary | ICD-10-CM | POA: Diagnosis not present

## 2022-03-03 DIAGNOSIS — Z79899 Other long term (current) drug therapy: Secondary | ICD-10-CM | POA: Diagnosis not present

## 2022-03-03 DIAGNOSIS — R4182 Altered mental status, unspecified: Secondary | ICD-10-CM | POA: Diagnosis not present

## 2022-03-03 DIAGNOSIS — E860 Dehydration: Secondary | ICD-10-CM | POA: Insufficient documentation

## 2022-03-03 DIAGNOSIS — R600 Localized edema: Secondary | ICD-10-CM | POA: Insufficient documentation

## 2022-03-03 DIAGNOSIS — G20 Parkinson's disease: Secondary | ICD-10-CM | POA: Diagnosis not present

## 2022-03-03 DIAGNOSIS — A419 Sepsis, unspecified organism: Secondary | ICD-10-CM | POA: Diagnosis not present

## 2022-03-03 DIAGNOSIS — R109 Unspecified abdominal pain: Secondary | ICD-10-CM | POA: Diagnosis not present

## 2022-03-03 DIAGNOSIS — I10 Essential (primary) hypertension: Secondary | ICD-10-CM | POA: Insufficient documentation

## 2022-03-03 DIAGNOSIS — J9811 Atelectasis: Secondary | ICD-10-CM | POA: Diagnosis not present

## 2022-03-03 DIAGNOSIS — K449 Diaphragmatic hernia without obstruction or gangrene: Secondary | ICD-10-CM | POA: Diagnosis not present

## 2022-03-03 LAB — CBC WITH DIFFERENTIAL/PLATELET
Abs Immature Granulocytes: 0.07 10*3/uL (ref 0.00–0.07)
Basophils Absolute: 0 10*3/uL (ref 0.0–0.1)
Basophils Relative: 0 %
Eosinophils Absolute: 0.4 10*3/uL (ref 0.0–0.5)
Eosinophils Relative: 4 %
HCT: 47.2 % (ref 39.0–52.0)
Hemoglobin: 15.4 g/dL (ref 13.0–17.0)
Immature Granulocytes: 1 %
Lymphocytes Relative: 22 %
Lymphs Abs: 2.1 10*3/uL (ref 0.7–4.0)
MCH: 29.2 pg (ref 26.0–34.0)
MCHC: 32.6 g/dL (ref 30.0–36.0)
MCV: 89.6 fL (ref 80.0–100.0)
Monocytes Absolute: 0.9 10*3/uL (ref 0.1–1.0)
Monocytes Relative: 10 %
Neutro Abs: 6.1 10*3/uL (ref 1.7–7.7)
Neutrophils Relative %: 63 %
Platelets: 228 10*3/uL (ref 150–400)
RBC: 5.27 MIL/uL (ref 4.22–5.81)
RDW: 14.1 % (ref 11.5–15.5)
WBC: 9.6 10*3/uL (ref 4.0–10.5)
nRBC: 0 % (ref 0.0–0.2)

## 2022-03-03 LAB — COMPREHENSIVE METABOLIC PANEL
ALT: 13 U/L (ref 0–44)
AST: 25 U/L (ref 15–41)
Albumin: 4.2 g/dL (ref 3.5–5.0)
Alkaline Phosphatase: 63 U/L (ref 38–126)
Anion gap: 10 (ref 5–15)
BUN: 41 mg/dL — ABNORMAL HIGH (ref 8–23)
CO2: 28 mmol/L (ref 22–32)
Calcium: 9.2 mg/dL (ref 8.9–10.3)
Chloride: 100 mmol/L (ref 98–111)
Creatinine, Ser: 1.42 mg/dL — ABNORMAL HIGH (ref 0.61–1.24)
GFR, Estimated: 52 mL/min — ABNORMAL LOW (ref >60)
Glucose, Bld: 102 mg/dL — ABNORMAL HIGH (ref 70–99)
Potassium: 3.8 mmol/L (ref 3.5–5.1)
Sodium: 138 mmol/L (ref 135–145)
Total Bilirubin: 0.5 mg/dL (ref 0.3–1.2)
Total Protein: 7.1 g/dL (ref 6.5–8.1)

## 2022-03-03 LAB — URINALYSIS, ROUTINE W REFLEX MICROSCOPIC
Bilirubin Urine: NEGATIVE
Glucose, UA: NEGATIVE mg/dL
Hgb urine dipstick: NEGATIVE
Ketones, ur: 5 mg/dL — AB
Leukocytes,Ua: NEGATIVE
Nitrite: NEGATIVE
Protein, ur: NEGATIVE mg/dL
Specific Gravity, Urine: 1.025 (ref 1.005–1.030)
pH: 5 (ref 5.0–8.0)

## 2022-03-03 LAB — CBG MONITORING, ED: Glucose-Capillary: 110 mg/dL — ABNORMAL HIGH (ref 70–99)

## 2022-03-03 LAB — TROPONIN I (HIGH SENSITIVITY)
Troponin I (High Sensitivity): 4 ng/L (ref <18)
Troponin I (High Sensitivity): 3 ng/L (ref <18)

## 2022-03-03 LAB — LACTIC ACID, PLASMA: Lactic Acid, Venous: 1.2 mmol/L (ref 0.5–1.9)

## 2022-03-03 LAB — MAGNESIUM: Magnesium: 2.5 mg/dL — ABNORMAL HIGH (ref 1.7–2.4)

## 2022-03-03 LAB — BRAIN NATRIURETIC PEPTIDE: B Natriuretic Peptide: 10.2 pg/mL (ref 0.0–100.0)

## 2022-03-03 LAB — D-DIMER, QUANTITATIVE: D-Dimer, Quant: 1.46 ug/mL-FEU — ABNORMAL HIGH (ref 0.00–0.50)

## 2022-03-03 MED ORDER — IOHEXOL 350 MG/ML SOLN
100.0000 mL | Freq: Once | INTRAVENOUS | Status: AC | PRN
  Administered 2022-03-03: 100 mL via INTRAVENOUS

## 2022-03-03 MED ORDER — SODIUM CHLORIDE 0.9 % IV BOLUS
500.0000 mL | Freq: Once | INTRAVENOUS | Status: AC
  Administered 2022-03-03: 500 mL via INTRAVENOUS

## 2022-03-03 MED ORDER — ACETAMINOPHEN 325 MG PO TABS
650.0000 mg | ORAL_TABLET | Freq: Once | ORAL | Status: AC
  Administered 2022-03-03: 650 mg via ORAL
  Filled 2022-03-03: qty 2

## 2022-03-03 MED ORDER — ALBUTEROL SULFATE (2.5 MG/3ML) 0.083% IN NEBU
2.5000 mg | INHALATION_SOLUTION | Freq: Once | RESPIRATORY_TRACT | Status: AC
  Administered 2022-03-03: 2.5 mg via RESPIRATORY_TRACT
  Filled 2022-03-03: qty 3

## 2022-03-03 MED ORDER — SODIUM CHLORIDE (PF) 0.9 % IJ SOLN
INTRAMUSCULAR | Status: AC
  Filled 2022-03-03: qty 50

## 2022-03-03 NOTE — ED Notes (Signed)
[facility] called for transport.  

## 2022-03-03 NOTE — ED Triage Notes (Signed)
Pt was just discharged today - had Dx of Sepsis. Wife called EMS again tonight d/t more weakness than normal. BSG 112 mg/dL

## 2022-03-03 NOTE — Discharge Instructions (Signed)
Nicholas [name]'s labs showed that he was dehydrated today.  Please have him get his kidney function rechecked by Monday.  Do not give him the Lasix/furosemide for the next 2 days.

## 2022-03-03 NOTE — ED Provider Notes (Signed)
[facility] DEPT Provider Note   CSN: [id number] Arrival date & time: 03/03/22  0055     History  Chief Complaint  Patient presents with   Weakness    Nicholas [name] is a 76 y.o. male.  The history is provided by the patient, medical records and the EMS personnel.  Weakness Nicholas [name] is a 76 y.o. male who presents to the Emergency Department complaining of altered mental status and unresponsive.  He presents to the emergency department by EMS from home for evaluation following an unresponsive episode at home.  He was discharged from the hospital earlier today following admission for sepsis.  Patient denies any complaints.  At baseline he is in a wheelchair.  No history obtained by patient's wife after his initial ED arrival.  She states that he was laying in his recliner, which is where he normally stays and she went to speak to him and he would not respond.  She lifted his hand up and it fell back down limp.  This lasted for about 5 minutes and she called 911.  When asked about this episode patient states he could hear her but was unable to answer or respond.  No reports of chest pain, shortness of breath, abdominal pain, nausea, vomiting.  He was admitted to the hospital recently for sepsis with fever and treated empirically with antibiotics, source of sepsis was not identified.  Rehab facility was recommended at time of discharge but patient did not qualify based on insurance.  He has a history of Parkinson's, melanoma status postresection 10 years ago, lymphedema of bilateral lower extremities, hypertension.  Home Medications Prior to Admission medications   Medication Sig Start Date End Date Taking? Authorizing Provider  acetaminophen (TYLENOL) 500 MG tablet Take 1,000 mg by mouth every 6 (six) hours as needed for mild pain.   Yes [provider]  albuterol (VENTOLIN HFA) 108 (90 Base) MCG/ACT inhaler Inhale 2 puffs into the lungs every 6  (six) hours as needed for wheezing or shortness of breath. 02/13/22  Yes [name], MD  carbidopa-levodopa (SINEMET IR) 25-100 MG tablet TAKE 2 TABLETS AT 7AM,11AM, 3PM, AND AT 7PM. Patient taking differently: Take 2 tablets by mouth 4 (four) times daily. TAKE 2 TABLETS AT 7AM,11AM, 3PM, AND AT 7PM. 12/05/21  Yes [name], [name], DO  furosemide (LASIX) 40 MG tablet Take 1 tablet (40 mg total) by mouth daily. 03/03/22  Yes Regalado, Belkys A, MD  lisinopril (ZESTRIL) 2.5 MG tablet Take 1 tablet (2.5 mg total) by mouth daily. 03/03/22  Yes Regalado, Belkys A, MD  polyethylene glycol (MIRALAX / GLYCOLAX) 17 g packet Take 17 g by mouth daily as needed for moderate constipation. 03/02/22  Yes Regalado, Belkys A, MD  rosuvastatin (CRESTOR) 10 MG tablet TAKE ONE TABLET BY MOUTH DAILY Patient taking differently: Take 10 mg by mouth at bedtime. 12/14/21  Yes [name], MD  Vibegron (GEMTESA PO) Take 75 mg by mouth at bedtime. Through urology   Yes [provider]      Allergies    Amoxicillin    Review of Systems   Review of Systems  Neurological:  Positive for weakness.  All other systems reviewed and are negative.   Physical Exam Updated Vital Signs BP (!) 149/131   Pulse 83   Temp 99 F (37.2 C)   Resp 15   Ht '5\' 7"'$  (1.702 m)   Wt 99 kg   SpO2 94%  BMI 34.18 kg/m  Physical Exam Vitals and nursing note reviewed.  Constitutional:      Appearance: He is well-developed.     Comments: Drowsy, prefers to keep eyes closed.  HENT:     Head: Normocephalic and atraumatic.  Cardiovascular:     Rate and Rhythm: Normal rate and regular rhythm.     Heart sounds: No murmur heard. Pulmonary:     Effort: Pulmonary effort is normal. No respiratory distress.     Comments: Wheezing in the bases Abdominal:     Palpations: Abdomen is soft.     Tenderness: There is no abdominal tenderness. There is no guarding or rebound.  Genitourinary:    Comments: Sacral pressure injury see  photo Musculoskeletal:        General: No tenderness.     Comments: Compression stockings to bilateral lower extremities.  1+ DP pulses bilaterally there is trace pitting edema to all 4 extremities  Skin:    General: Skin is warm and dry.  Neurological:     Mental Status: He is oriented to person, place, and time.     Comments: 4+ out of 5 strength in bilateral upper extremities.  3 out of 5 strength in bilateral lower extremities.  Psychiatric:        Behavior: Behavior normal.      ED Results / Procedures / Treatments   Labs (all labs ordered are listed, but only abnormal results are displayed) Labs Reviewed  COMPREHENSIVE METABOLIC PANEL - Abnormal; Notable for the following components:      Result Value   Glucose, Bld 102 (*)    BUN 41 (*)    Creatinine, Ser 1.42 (*)    GFR, Estimated 52 (*)    All other components within normal limits  URINALYSIS, ROUTINE W REFLEX MICROSCOPIC - Abnormal; Notable for the following components:   Ketones, ur 5 (*)    All other components within normal limits  MAGNESIUM - Abnormal; Notable for the following components:   Magnesium 2.5 (*)    All other components within normal limits  D-DIMER, QUANTITATIVE - Abnormal; Notable for the following components:   D-Dimer, Quant 1.46 (*)    All other components within normal limits  CBG MONITORING, ED - Abnormal; Notable for the following components:   Glucose-Capillary 110 (*)    All other components within normal limits  CULTURE, BLOOD (ROUTINE X 2)  CULTURE, BLOOD (ROUTINE X 2)  URINE CULTURE  LACTIC ACID, PLASMA  CBC WITH DIFFERENTIAL/PLATELET  BRAIN NATRIURETIC PEPTIDE  TROPONIN I (HIGH SENSITIVITY)  TROPONIN I (HIGH SENSITIVITY)    EKG EKG Interpretation  Date/Time:  Friday March 03 2022 01:05:00 EDT Ventricular Rate:  84 PR Interval:  168 QRS Duration: 93 QT Interval:  369 QTC Calculation: 437 R Axis:   78 Text Interpretation: Sinus rhythm Consider left atrial enlargement  Borderline T abnormalities, anterior leads Confirmed by Quintella Reichert [phone]) on 03/03/2022 5:06:23 AM  Radiology CT Angio Chest PE W/Cm &/Or Wo Cm  Result Date: 03/03/2022 CLINICAL DATA:  Recent admission for sepsis.  Return for weakness EXAM: CT ANGIOGRAPHY CHEST CT ABDOMEN AND PELVIS WITH CONTRAST TECHNIQUE: Multidetector CT imaging of the chest was performed using the standard protocol during bolus administration of intravenous contrast. Multiplanar CT image reconstructions and MIPs were obtained to evaluate the vascular anatomy. Multidetector CT imaging of the abdomen and pelvis was performed using the standard protocol during bolus administration of intravenous contrast. RADIATION DOSE REDUCTION: This exam was performed according to the  departmental dose-optimization program which includes automated exposure control, adjustment of the mA and/or kV according to patient size and/or use of iterative reconstruction technique. CONTRAST:  149m OMNIPAQUE IOHEXOL 350 MG/ML SOLN COMPARISON:  None Available. FINDINGS: CTA CHEST FINDINGS Cardiovascular: Normal heart size. No pericardial effusion. There is a triangular area of low-density at the right main pulmonary artery bifurcation at the level of extensive streak artifact. Similar appearing mixing artifact is seen the left main bifurcation. Very limited assessment of lobar and segmental branches due to interruption of contrast with bolus dispersion and extensive breathing artifact. Atheromatous calcification of the aorta. No acute aortic finding Mediastinum/Nodes: Negative for adenopathy or mass. Lungs/Pleura: Limited by motion. There is no edema, consolidation, effusion, or pneumothorax. Generalized airway thickening. Mild atelectasis in the left lower lobe. Musculoskeletal: No acute finding Review of the MIP images confirms the above findings. CT ABDOMEN and PELVIS FINDINGS Hepatobiliary: No focal liver abnormality.No evidence of biliary obstruction or stone.  Pancreas: Unremarkable. Spleen: Unremarkable. Adrenals/Urinary Tract: Negative adrenals. No hydronephrosis or stone. Unremarkable bladder. Stomach/Bowel: No obstruction. No appendicitis. Small sliding hiatal hernia. Tortuous colon with moderate stool. Distal colonic diverticulosis. Vascular/Lymphatic: No acute vascular abnormality. Scattered atheromatous calcification no mass or adenopathy. Reproductive:No acute findings. Other: No ascites or pneumoperitoneum. Musculoskeletal: No acute abnormalities. Remote L1 superior endplate fracture. Review of the MIP images confirms the above findings. IMPRESSION: Chest CTA: 1. Significantly degraded by motion and bolus dispersion. No gross pulmonary embolism. 2. Airway thickening and left lower lobe atelectasis. Negative for pneumonia. Abdominal CT: 1.  No acute finding. 2. Atherosclerosis and colonic diverticulosis. Electronically Signed   By: JJorje GuildM.D.   On: 03/03/2022 04:52   CT Abdomen Pelvis W Contrast  Result Date: 03/03/2022 CLINICAL DATA:  Recent admission for sepsis.  Return for weakness EXAM: CT ANGIOGRAPHY CHEST CT ABDOMEN AND PELVIS WITH CONTRAST TECHNIQUE: Multidetector CT imaging of the chest was performed using the standard protocol during bolus administration of intravenous contrast. Multiplanar CT image reconstructions and MIPs were obtained to evaluate the vascular anatomy. Multidetector CT imaging of the abdomen and pelvis was performed using the standard protocol during bolus administration of intravenous contrast. RADIATION DOSE REDUCTION: This exam was performed according to the departmental dose-optimization program which includes automated exposure control, adjustment of the mA and/or kV according to patient size and/or use of iterative reconstruction technique. CONTRAST:  1040mOMNIPAQUE IOHEXOL 350 MG/ML SOLN COMPARISON:  None Available. FINDINGS: CTA CHEST FINDINGS Cardiovascular: Normal heart size. No pericardial effusion. There is a  triangular area of low-density at the right main pulmonary artery bifurcation at the level of extensive streak artifact. Similar appearing mixing artifact is seen the left main bifurcation. Very limited assessment of lobar and segmental branches due to interruption of contrast with bolus dispersion and extensive breathing artifact. Atheromatous calcification of the aorta. No acute aortic finding Mediastinum/Nodes: Negative for adenopathy or mass. Lungs/Pleura: Limited by motion. There is no edema, consolidation, effusion, or pneumothorax. Generalized airway thickening. Mild atelectasis in the left lower lobe. Musculoskeletal: No acute finding Review of the MIP images confirms the above findings. CT ABDOMEN and PELVIS FINDINGS Hepatobiliary: No focal liver abnormality.No evidence of biliary obstruction or stone. Pancreas: Unremarkable. Spleen: Unremarkable. Adrenals/Urinary Tract: Negative adrenals. No hydronephrosis or stone. Unremarkable bladder. Stomach/Bowel: No obstruction. No appendicitis. Small sliding hiatal hernia. Tortuous colon with moderate stool. Distal colonic diverticulosis. Vascular/Lymphatic: No acute vascular abnormality. Scattered atheromatous calcification no mass or adenopathy. Reproductive:No acute findings. Other: No ascites or pneumoperitoneum. Musculoskeletal: No acute abnormalities.  Remote L1 superior endplate fracture. Review of the MIP images confirms the above findings. IMPRESSION: Chest CTA: 1. Significantly degraded by motion and bolus dispersion. No gross pulmonary embolism. 2. Airway thickening and left lower lobe atelectasis. Negative for pneumonia. Abdominal CT: 1.  No acute finding. 2. Atherosclerosis and colonic diverticulosis. Electronically Signed   By: Jorje Guild M.D.   On: 03/03/2022 04:52   CT Head Wo Contrast  Result Date: 03/03/2022 CLINICAL DATA:  Mental status change, unknown cause EXAM: CT HEAD WITHOUT CONTRAST TECHNIQUE: Contiguous axial images were obtained  from the base of the skull through the vertex without intravenous contrast. RADIATION DOSE REDUCTION: This exam was performed according to the departmental dose-optimization program which includes automated exposure control, adjustment of the mA and/or kV according to patient size and/or use of iterative reconstruction technique. COMPARISON:  None Available. FINDINGS: Brain: Normal anatomic configuration. Parenchymal volume loss is commensurate with the patient's age. Mild periventricular white matter changes are present likely reflecting the sequela of small vessel ischemia. No abnormal intra or extra-axial mass lesion or fluid collection. No abnormal mass effect or midline shift. No evidence of acute intracranial hemorrhage or infarct. Ventricular size is normal. Cerebellum unremarkable. Vascular: No asymmetric hyperdense vasculature at the skull base. Skull: Intact Sinuses/Orbits: Paranasal sinuses are clear. Orbits are unremarkable. Other: Mastoid air cells and middle ear cavities are clear. IMPRESSION: No acute intracranial abnormality. Mild senescent change. Electronically Signed   By: Fidela Salisbury M.D.   On: 03/03/2022 02:24   DG Chest Port 1 View  Result Date: 03/03/2022 CLINICAL DATA:  Altered mental status. Weakness. Discharged from hospital earlier today. EXAM: PORTABLE CHEST 1 VIEW COMPARISON:  Radiograph 02/23/2022 FINDINGS: Again seen elevation of right hemidiaphragm. Stable heart size and mediastinal contours. Aortic atherosclerosis. No focal airspace disease, pulmonary edema, or pleural effusion. No pneumothorax. Stable osseous structures. IMPRESSION: Stable exam.  No acute chest findings. Electronically Signed   By: Keith Rake M.D.   On: 03/03/2022 02:07    Procedures Procedures    Medications Ordered in ED Medications  sodium chloride (PF) 0.9 % injection (has no administration in time range)  acetaminophen (TYLENOL) tablet 650 mg (650 mg Oral Given 03/03/22 0250)  sodium  chloride 0.9 % bolus 500 mL (0 mLs Intravenous Stopped 03/03/22 0532)  iohexol (OMNIPAQUE) 350 MG/ML injection 100 mL (100 mLs Intravenous Contrast Given 03/03/22 0426)  albuterol (PROVENTIL) (2.5 MG/3ML) 0.083% nebulizer solution 2.5 mg (2.5 mg Nebulization Given 03/03/22 0532)    ED Course/ Medical Decision Making/ A&P                           Medical Decision Making Amount and/or Complexity of Data Reviewed Labs: ordered. Radiology: ordered.  Risk OTC drugs. Prescription drug management.   Patient with history of Parkinson's, recent admission for sepsis here for evaluation following unresponsive episode.  Patient states that he was cognizant and aware during the episode but could not communicate.  He has a temperature to 99 rectal at time of ED arrival but no acute complaints.  He does have wheezing without respiratory distress.  Labs significant for mild elevation in BUN and creatinine compared to hospital discharge.  No leukocytosis.  Given his recent hospitalization a D-dimer was obtained, which is elevated and a CTA PE study was obtained.  CT is negative for PE or pneumonia.  Given recent sepsis admission of unclear etiology CT abdomen pelvis was obtained.  No evidence of intra-abdominal infection.  On multiple evaluations during his ED stay patient without complaints.  He does have sacral pressure injury with no skin breakdown.  No current evidence of infectious nidus.  Feel patient is stable for discharge home with ongoing outpatient follow-up.  Given his mild elevation in creatinine recommend that his Lasix be held for the next 2 days until he can follow-up with his family doctor.,        Final Clinical Impression(s) / ED Diagnoses Final diagnoses:  Syncope, unspecified syncope type  Dehydration    Rx / DC Orders ED Discharge Orders     None         Quintella Reichert, MD 03/03/22 [phone]

## 2022-03-04 LAB — URINE CULTURE: Culture: NO GROWTH

## 2022-03-08 LAB — CULTURE, BLOOD (ROUTINE X 2)
Culture: NO GROWTH
Culture: NO GROWTH
Special Requests: ADEQUATE
Special Requests: ADEQUATE

## 2022-03-14 ENCOUNTER — Other Ambulatory Visit: Payer: Self-pay | Admitting: Neurology

## 2022-03-14 DIAGNOSIS — G20 Parkinson's disease: Secondary | ICD-10-CM

## 2022-03-23 NOTE — Progress Notes (Signed)
Virtual Visit Via Video       Consent was obtained for video visit:  Yes.   Answered questions that patient had about telehealth interaction:  Yes.   I discussed the limitations, risks, security and privacy concerns of performing an evaluation and management service by telemedicine. I also discussed with the patient that there may be a patient responsible charge related to this service. The patient expressed understanding and agreed to proceed.  Pt location: Home Physician Location: office Name of referring provider:  [name], MD I connected with Nicholas [name] at patients initiation/request on 03/28/2022 at  3:30 PM EDT by video enabled telemedicine application and verified that I am speaking with the correct person using two identifiers. Pt MRN:  [id number] Pt DOB:  08-Jul-1946 Video Participants:  Nicholas [name];  wife  Assessment/Plan:   1.  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  The falls have occurred in the transfers.  He has no [employer] fast streamer.  May need more caregivers in the home.  -discussed skin biopsies but didn't recommend given issues with skin integrity in the legs.  These do help to distinguish alpha synucleinopathies versus tauopathies, but do not think that benefits outweigh risks.   2.  History of melanoma  -Follows with dermatology.  Understands that parkinsonism does increase risk for melanoma as well.   3.  Lymphedema  -Follows with wound care.  Doing well right now.    4.  Sialorrhea  -pt not ready for botox  5.  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  I wonder if he is having trouble intermittently using the hand because he has sat in the wheelchair and pushed on the arm and gotten some type of neuropathy ( ? Radial), although I did not really see that on  examination today.  That being said, I was not able to get a good examination as he was on video.  Patient declined any diagnostics, including EMG.  Subjective:   Nicholas [name] was seen today in follow up for parkinsonism.  My previous records were reviewed prior to todays visit as well as outside records available to me.  Patient with wife who supplements the history.  Patient in hospital in aug for SIRS of unknown etiology for infection.  He was discharged on August 10 and was back in the emergency room on August 11 with mental status change.  Patient was apparently in the recliner and patients wife went to speak with him (he had fallen asleep)  and he would not respond.  ER records indicate she lifted up his hand and it fell back down.  Patient indicates that he was aware but could not respond.  Emergency room evaluation was unremarkable.  His Lasix was held for 2 days and he was told to follow-up with primary care.  I do not see that he has had a follow-up appointment with his primary care physician.  He is doing better with increasing fluids.  He is having more trouble with transfers.  He is using a Denna Haggard device.  His insurance didn't help with that.  He has a caregiver from 9am-noon on weekend and on weekday she comes at 8am-11am.  He sleeps in the chair/recliner.  Drooling is "bad" but doesn't want botox.  Legs are doing well and "  look normal."  Pt with paresthesias in the hands.  He did not try the cock-up wrist splints, but did try some that he bought over-the-counter for short period of time.  B12 injections have helped the hand paresthesias some.  They state that he has trouble raising L hand/arm, but also states that it is intermittent..    Current prescribed movement disorder medications: Carbidopa/levodopa 25/100, 2 tablets 4 times per day    ALLERGIES:   Allergies  Allergen Reactions   Amoxicillin Swelling    Presumed angioedema of lips     CURRENT MEDICATIONS:  Outpatient  Encounter Medications as of 03/28/2022  Medication Sig   carbidopa-levodopa (SINEMET IR) 25-100 MG tablet TAKE 2 TABLETS AT 7AM,11AM, 3PM, AND AT 7PM.   acetaminophen (TYLENOL) 500 MG tablet Take 1,000 mg by mouth every 6 (six) hours as needed for mild pain.   albuterol (VENTOLIN HFA) 108 (90 Base) MCG/ACT inhaler Inhale 2 puffs into the lungs every 6 (six) hours as needed for wheezing or shortness of breath.   furosemide (LASIX) 40 MG tablet Take 1 tablet (40 mg total) by mouth daily.   lisinopril (ZESTRIL) 2.5 MG tablet Take 1 tablet (2.5 mg total) by mouth daily.   polyethylene glycol (MIRALAX / GLYCOLAX) 17 g packet Take 17 g by mouth daily as needed for moderate constipation.   rosuvastatin (CRESTOR) 10 MG tablet TAKE ONE TABLET BY MOUTH DAILY (Patient taking differently: Take 10 mg by mouth at bedtime.)   Vibegron (GEMTESA PO) Take 75 mg by mouth at bedtime. Through urology   No facility-administered encounter medications on file as of 03/28/2022.    Objective:   PHYSICAL EXAMINATION:    VITALS:   There were no vitals filed for this visit.     GEN:  The patient appears stated age and is in NAD. HEENT:  Normocephalic, atraumatic.  There is profuse sialorrhea.    Neurological examination:  Orientation: The patient is alert and oriented x3. Cranial nerves: There is good facial symmetry with facial hypomimia.  There is facial dyskinesia/dystonia.  EOMI with the exception of upgaze paresis.  There is square wave jerks.  The speech is fluent and clear but has fairly significant hypophonia.  Hearing is intact to conversational tone. Sensation: unable Motor: Strength is at least antigravity x4.  Although they stated that he cannot lift the left arm, he lifts it and waves it in the air for me and then his wife states "of course he can do it now"  Movement examination: Tone: unable Abnormal movements: none Coordination:  There is no  decremation with RAM's, with hand opening and closing  and finger taps bilaterally Gait and Station: Unable.  He did stand up, but did not walk.  I have reviewed and interpreted the following labs independently    Chemistry      Component Value Date/Time   NA 138 03/03/2022 0127   K 3.8 03/03/2022 0127   CL 100 03/03/2022 0127   CO2 28 03/03/2022 0127   BUN 41 (H) 03/03/2022 0127   CREATININE 1.42 (H) 03/03/2022 0127   CREATININE 1.01 10/03/2019 1517      Component Value Date/Time   CALCIUM 9.2 03/03/2022 0127   ALKPHOS 63 03/03/2022 0127   AST 25 03/03/2022 0127   ALT 13 03/03/2022 0127   BILITOT 0.5 03/03/2022 0127       Lab Results  Component Value Date   WBC 9.6 03/03/2022   HGB 15.4 03/03/2022   HCT 47.2 03/03/2022  MCV 89.6 03/03/2022   PLT 228 03/03/2022    Lab Results  Component Value Date   TSH 1.56 12/01/2021     Follow up Instructions      -I discussed the assessment and treatment plan with the patient. The patient was provided an opportunity to ask questions and all were answered. The patient agreed with the plan and demonstrated an understanding of the instructions.   The patient was advised to call back or seek an in-person evaluation if the symptoms worsen or if the condition fails to improve as anticipated.     Alonza Bogus, DO   Cc:  [name], MD

## 2022-03-28 ENCOUNTER — Telehealth (INDEPENDENT_AMBULATORY_CARE_PROVIDER_SITE_OTHER): Payer: PPO | Admitting: Neurology

## 2022-03-28 ENCOUNTER — Encounter: Payer: Self-pay | Admitting: Neurology

## 2022-03-28 VITALS — Wt 218.0 lb

## 2022-03-28 DIAGNOSIS — G20 Parkinson's disease: Secondary | ICD-10-CM

## 2022-03-29 ENCOUNTER — Telehealth: Payer: Self-pay | Admitting: Family Medicine

## 2022-03-29 NOTE — Telephone Encounter (Signed)
See below

## 2022-03-29 NOTE — Telephone Encounter (Signed)
Please call and see if pt is willing to do a vv this pm at 4:20pm.

## 2022-03-29 NOTE — Telephone Encounter (Signed)
Medication is not used for prophylaxis-if he is having symptoms-I need to know the first date of symptoms-can also offer a virtual visit at 4:20 AM happy to review with him-if he wants me to send this in I need him to review the note from December 2022 where we treated him with molnupiravir for instructions about monitoring in regards to COVID

## 2022-03-29 NOTE — Telephone Encounter (Signed)
I have left patient vm to call our office back to get a Virtual Visit scheduled with a provider.

## 2022-03-29 NOTE — Telephone Encounter (Signed)
Caller States: -Her (wife) at home COVID-19 test is positive 09/06, symptoms started 09/05. -Pt's wife has 09/06 vv with SW at [facility] to get medication. -Previously, pt was given medicine to reduce possible contraction of virus.   Caller requests: -Same medication prescribed in December for pt.   Preferred Pharmacy: [facility], [place], [place] [address]  Phone:  [phone]  Fax:  [phone]

## 2022-04-02 ENCOUNTER — Other Ambulatory Visit: Payer: Self-pay | Admitting: Family Medicine

## 2022-04-12 ENCOUNTER — Other Ambulatory Visit: Payer: Self-pay | Admitting: Family Medicine

## 2022-04-17 ENCOUNTER — Encounter: Payer: Self-pay | Admitting: *Deleted

## 2022-04-28 DIAGNOSIS — N3941 Urge incontinence: Secondary | ICD-10-CM | POA: Diagnosis not present

## 2022-04-28 DIAGNOSIS — R32 Unspecified urinary incontinence: Secondary | ICD-10-CM | POA: Diagnosis not present

## 2022-05-13 DIAGNOSIS — E669 Obesity, unspecified: Secondary | ICD-10-CM | POA: Diagnosis not present

## 2022-05-13 DIAGNOSIS — I10 Essential (primary) hypertension: Secondary | ICD-10-CM | POA: Diagnosis not present

## 2022-05-13 DIAGNOSIS — Z89422 Acquired absence of other left toe(s): Secondary | ICD-10-CM | POA: Diagnosis not present

## 2022-05-13 DIAGNOSIS — G20A1 Parkinson's disease without dyskinesia, without mention of fluctuations: Secondary | ICD-10-CM | POA: Diagnosis not present

## 2022-05-13 DIAGNOSIS — Z87891 Personal history of nicotine dependence: Secondary | ICD-10-CM | POA: Diagnosis not present

## 2022-05-13 DIAGNOSIS — I739 Peripheral vascular disease, unspecified: Secondary | ICD-10-CM | POA: Diagnosis not present

## 2022-05-13 DIAGNOSIS — R32 Unspecified urinary incontinence: Secondary | ICD-10-CM | POA: Diagnosis not present

## 2022-05-13 DIAGNOSIS — I872 Venous insufficiency (chronic) (peripheral): Secondary | ICD-10-CM | POA: Diagnosis not present

## 2022-05-13 DIAGNOSIS — E785 Hyperlipidemia, unspecified: Secondary | ICD-10-CM | POA: Diagnosis not present

## 2022-05-23 DIAGNOSIS — R32 Unspecified urinary incontinence: Secondary | ICD-10-CM | POA: Diagnosis not present

## 2022-06-05 ENCOUNTER — Ambulatory Visit (INDEPENDENT_AMBULATORY_CARE_PROVIDER_SITE_OTHER): Payer: PPO

## 2022-06-05 VITALS — Wt 218.0 lb

## 2022-06-05 DIAGNOSIS — Z0000 Encounter for general adult medical examination without abnormal findings: Secondary | ICD-10-CM | POA: Diagnosis not present

## 2022-06-05 NOTE — Progress Notes (Signed)
I connected with  Nicholas [name] on 06/05/22 by a audio enabled telemedicine application and verified that I am speaking with the correct person using two identifiers.  Patient Location: Home  Provider Location: Office/Clinic  I discussed the limitations of evaluation and management by telemedicine. The patient expressed understanding and agreed to proceed.   Subjective:   Nicholas [name] is a 76 y.o. male who presents for Medicare Annual/Subsequent preventive examination.  Review of Systems     Cardiac Risk Factors include: advanced age (>9mn, >>68women);male gender;obesity (BMI >30kg/m2);sedentary lifestyle;dyslipidemia;hypertension     Objective:    Today's Vitals   06/05/22 1440  Weight: 218 lb (98.9 kg)   Body mass index is 34.14 kg/m.     06/05/2022    2:45 PM 03/28/2022    1:21 PM 02/23/2022    2:32 PM 02/23/2022   10:52 AM 10/03/2021   11:05 PM 07/26/2021    2:57 PM 05/16/2021    2:33 PM  Advanced Directives  Does Patient Have a Medical Advance Directive? Yes Yes Yes No Yes Yes Yes  Type of AParamedicof AHatterasLiving will HVillalbaLiving will;Out of facility DNR (pink MOST or yellow form) Out of facility DNR (pink MOST or yellow form);HHardyLiving will  Healthcare Power of [place] Does patient want to make changes to medical advance directive? No - Patient declined  No - Patient declined  No - Patient declined    Copy of [place] Chart? Yes - validated most recent copy scanned in chart (See row information)  No - copy requested  No - copy requested  No - copy requested  Would patient like information on creating a medical advance directive?    No - Patient declined       Current Medications (verified) Outpatient Encounter Medications as of 06/05/2022  Medication Sig   acetaminophen (TYLENOL) 500 MG tablet Take 1,000 mg by mouth every 6  (six) hours as needed for mild pain.   albuterol (VENTOLIN HFA) 108 (90 Base) MCG/ACT inhaler Inhale 2 puffs into the lungs every 6 (six) hours as needed for wheezing or shortness of breath.   carbidopa-levodopa (SINEMET IR) 25-100 MG tablet TAKE 2 TABLETS AT 7AM,11AM, 3PM, AND AT 7PM.   polyethylene glycol (MIRALAX / GLYCOLAX) 17 g packet Take 17 g by mouth daily as needed for moderate constipation.   rosuvastatin (CRESTOR) 10 MG tablet TAKE ONE TABLET BY MOUTH DAILY   Vibegron (GEMTESA PO) Take 75 mg by mouth at bedtime. Through urology (Patient not taking: Reported on 06/05/2022)   [DISCONTINUED] furosemide (LASIX) 40 MG tablet Take 1 tablet (40 mg total) by mouth daily.   [DISCONTINUED] lisinopril (ZESTRIL) 2.5 MG tablet Take 1 tablet (2.5 mg total) by mouth daily.   No facility-administered encounter medications on file as of 06/05/2022.    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  2009   removal of melanoma of left foot/amputation of toes 1&25 Jul 2006   WRIST FRACTURE SURGERY     76years old-set   Family History  Problem Relation Age of Onset  Hypertension Father    CVA Father        age 104   Hyperlipidemia Father    Other Mother        Deceased   Healthy Sister    Healthy Brother    Social History   Socioeconomic History   Marital status: Married    Spouse name: Not on file   Number of children: Not on file   Years of education: Not on file   Highest education level: Not on file  Occupational History   Occupation: retired    Comment: Engineer, maintenance (IT)  Tobacco Use   Smoking status: Former    Packs/day: 1.00    Years: 16.00    Total pack years: 16.00    Types: Cigarettes    Quit date: 12/22/1993    Years since  quitting: 28.4   Smokeless tobacco: Never  Vaping Use   Vaping Use: Never used  Substance and Sexual Activity   Alcohol use: No    Alcohol/week: 21.0 standard drinks of alcohol    Types: 21 Standard drinks or equivalent per week   Drug use: No   Sexual activity: Not on file  Other Topics Concern   Not on file  Social History Narrative   Married 1981. Wife has kids-1 with 1 adopted grandchild.       Retired Tax adviser      Highest level of education:  B.S.      Hobbies: antiques, former Air cabin crew      Exercise: none currently.    Social Determinants of Health   Financial Resource Strain: Low Risk  (06/05/2022)   Overall Financial Resource Strain (CARDIA)    Difficulty of Paying Living Expenses: Not hard at all  Food Insecurity: No Food Insecurity (06/05/2022)   Hunger Vital Sign    Worried About Running Out of Food in the Last Year: Never true    Ran Out of Food in the Last Year: Never true  Transportation Needs: No Transportation Needs (06/05/2022)   PRAPARE - Hydrologist (Medical): No    Lack of Transportation (Non-Medical): No  Physical Activity: Inactive (06/05/2022)   Exercise Vital Sign    Days of Exercise per Week: 0 days    Minutes of Exercise per Session: 0 min  Stress: No Stress Concern Present (06/05/2022)   [place]    Feeling of Stress : Not at all  Social Connections: Moderately Isolated (06/05/2022)   Social Connection and Isolation Panel [NHANES]    Frequency of Communication with Friends and Family: More than three times a week    Frequency of Social Gatherings with Friends and Family: Once a week    Attends Religious Services: Never    Marine scientist or Organizations: No    Attends Music therapist: Never    Marital Status: Married    Tobacco Counseling Counseling given: Not Answered   Clinical Intake:  Pre-visit preparation  completed: Yes  Pain : No/denies pain     BMI - recorded: 34.14 Nutritional Status: BMI > 30  Obese Nutritional Risks: None Diabetes: No  How often do you need to have someone help you when you read instructions, pamphlets, or other written materials from your doctor or pharmacy?: 1 - Never  Diabetic?no  Interpreter Needed?: No  Information entered by :: Charlott Rakes, LPN   Activities of Daily Living    06/05/2022    2:45 PM 02/23/2022  2:35 PM  In your present state of health, do you have any difficulty performing the following activities:  Hearing? 0   Vision? 0   Difficulty concentrating or making decisions? 0   Walking or climbing stairs? 0   Dressing or bathing? 0   Doing errands, shopping? 0 1  Preparing Food and eating ? N   Using the Toilet? N   In the past six months, have you accidently leaked urine? N   Do you have problems with loss of bowel control? N   Managing your Medications? N   Managing your Finances? N   Housekeeping or managing your Housekeeping? N     Patient Care Team: [name], MD as PCP - General (Family Medicine) [name], MD as Consulting Physician (Dermatology) [name], MD as Consulting Physician (Ophthalmology) [name], MD as Consulting Physician (Orthopedic Surgery) [name], DDS as Referring Physician (Dentistry) [name], PT as Physical Therapist (Physical Therapy) [name], [name], DO as Consulting Physician (Neurology) [name] as Consulting Physician (Optometry) [facility], [place], MD as Attending Physician Kathie Rhodes, MD (Inactive) as Consulting Physician (Urology)  Indicate any recent Medical Services you may have received from other than [facility] providers in the past year (date may be approximate).     Assessment:   This is a routine wellness examination for [facility].  Hearing/Vision screen Hearing Screening - Comments:: Pt denies any hearing issues   Dietary issues  and exercise activities discussed: Current Exercise Habits: The patient does not participate in regular exercise at present   Goals Addressed             This Visit's Progress    Patient Stated       Stay healthy        Depression Screen    06/05/2022    2:43 PM 05/16/2021    2:32 PM 01/13/2021    9:42 AM 09/06/2020    3:28 PM 05/14/2020    2:35 PM 12/12/2019   11:00 AM 10/03/2019    2:06 PM  PHQ 2/9 Scores  PHQ - 2 Score 0 0 0 0 0 0 0  PHQ- 9 Score       0    Fall Risk    06/05/2022    2:45 PM 03/28/2022    1:21 PM 07/26/2021    2:57 PM 05/16/2021    2:34 PM 01/11/2021    9:36 AM  Fall Risk   Falls in the past year? 0 0 1 0 1  Number falls in past yr: 0 0 0 0 1  Injury with Fall? 0 0 1 0 1  Risk for fall due to : No Fall Risks   Impaired vision;Impaired balance/gait;Impaired mobility   Follow up Falls prevention discussed   Falls prevention discussed     FALL RISK PREVENTION PERTAINING TO THE HOME:  Any stairs in or around the home? No  If so, are there any without handrails? No  Home free of loose throw rugs in walkways, pet beds, electrical cords, etc? Yes  Adequate lighting in your home to reduce risk of falls? Yes   ASSISTIVE DEVICES UTILIZED TO PREVENT FALLS:  Life alert? No  Use of a cane, walker or w/c? Yes  Grab bars in the bathroom? Yes  Shower chair or bench in shower? Yes  Elevated toilet seat or a handicapped toilet? Yes   TIMED UP AND GO:  Was the test performed? No .    Cognitive Function:  06/05/2022    2:47 PM 05/16/2021    2:37 PM 05/14/2020    2:41 PM 04/14/2019    3:29 PM  6CIT Screen  What Year? 0 points 0 points 0 points 0 points  What month? 0 points 0 points 0 points 0 points  What time? 0 points 0 points  0 points  Count back from 20 0 points 0 points 0 points 0 points  Months in reverse 0 points 0 points 0 points 0 points  Repeat phrase 0 points 0 points 0 points 0 points  Total Score 0 points 0 points  0 points     Immunizations Immunization History  Administered Date(s) Administered   Fluad Quad(high Dose 65+) 04/14/2019, 04/13/2020, 05/24/2021   Influenza Split 04/24/2011, 04/24/2012   Influenza Whole 05/19/2008, 04/15/2010   Influenza, High Dose Seasonal PF 05/19/2013, 05/03/2016, 05/07/2017, 05/07/2018   Influenza,inj,Quad PF,6+ Mos 04/29/2014, 05/04/2015   Moderna Sars-Covid-2 Vaccination 07/01/2020   PFIZER(Purple Top)SARS-COV-2 Vaccination 08/14/2019, 09/04/2019   Pneumococcal Conjugate-13 12/30/2014   Pneumococcal Polysaccharide-23 04/24/2011   Td 07/24/2001   Tdap 08/02/2012, 10/20/2020   Zoster, Live 04/24/2011    TDAP status: Up to date  Flu Vaccine status: Due, Education has been provided regarding the importance of this vaccine. Advised may receive this vaccine at local pharmacy or Health Dept. Aware to provide a copy of the vaccination record if obtained from local pharmacy or Health Dept. Verbalized acceptance and understanding.  Pneumococcal vaccine status: Up to date  Covid-19 vaccine status: Completed vaccines  Qualifies for Shingles Vaccine? Yes   Zostavax completed No   Shingrix Completed?: No.    Education has been provided regarding the importance of this vaccine. Patient has been advised to call insurance company to determine out of pocket expense if they have not yet received this vaccine. Advised may also receive vaccine at local pharmacy or Health Dept. Verbalized acceptance and understanding.  Screening Tests Health Maintenance  Topic Date Due   Zoster Vaccines- Shingrix (1 of 2) Never done   COVID-19 Vaccine (4 - Pfizer series) 08/26/2020   INFLUENZA VACCINE  02/21/2022   Hepatitis C Screening  07/21/2098 (Originally 04/02/1964)   Medicare Annual Wellness (AWV)  06/06/2023   TETANUS/TDAP  10/21/2030   Pneumonia Vaccine 81+ Years old  Completed   HPV VACCINES  Aged Out   COLONOSCOPY (Pts 45-35yr Insurance coverage will need to be confirmed)  Discontinued    Fecal DNA (Cologuard)  Discontinued    Health Maintenance  Health Maintenance Due  Topic Date Due   Zoster Vaccines- Shingrix (1 of 2) Never done   COVID-19 Vaccine (4 - Pfizer series) 08/26/2020   INFLUENZA VACCINE  02/21/2022    Colorectal cancer screening: Type of screening: Colonoscopy. Completed 04/09/09 pt staed he completed a colofuard this year . Repeat every per Provider  years   Additional Screening:  Hepatitis C Screening: does qualify;  Vision Screening: Recommended annual ophthalmology exams for early detection of glaucoma and other disorders of the eye. Is the patient up to date with their annual eye exam?  Yes  Who is the provider or what is the name of the office in which the patient attends annual eye exams? Dr DIdolina Primer If pt is not established with a provider, would they like to be referred to a provider to establish care? No .   Dental Screening: Recommended annual dental exams for proper oral hygiene  Community Resource Referral / Chronic Care Management: CRR required this visit?  No  CCM required this visit?  No      Plan:     I have personally reviewed and noted the following in the patient's chart:   Medical and social history Use of alcohol, tobacco or illicit drugs  Current medications and supplements including opioid prescriptions. Patient is not currently taking opioid prescriptions. Functional ability and status Nutritional status Physical activity Advanced directives List of other physicians Hospitalizations, surgeries, and ER visits in previous 12 months Vitals Screenings to include cognitive, depression, and falls Referrals and appointments  In addition, I have reviewed and discussed with patient certain preventive protocols, quality metrics, and best practice recommendations. A written personalized care plan for preventive services as well as general preventive health recommendations were provided to patient.     Willette Brace,  LPN   [id number]   Nurse Notes: none

## 2022-06-05 NOTE — Patient Instructions (Signed)
Nicholas [name] , Thank you for taking time to come for your Medicare Wellness Visit. I appreciate your ongoing commitment to your health goals. Please review the following plan we discussed and let me know if I can assist you in the future.   These are the goals we discussed:  Goals      Maintain current health status.      Patient Stated     None at this time     Patient Stated     None at this time         This is a list of the screening recommended for you and due dates:  Health Maintenance  Topic Date Due   Zoster (Shingles) Vaccine (1 of 2) Never done   COVID-19 Vaccine (4 - Pfizer series) 08/26/2020   Flu Shot  02/21/2022   Medicare Annual Wellness Visit  05/16/2022   Hepatitis C Screening: USPSTF Recommendation to screen - Ages 18-79 yo.  07/21/2098*   Tetanus Vaccine  10/21/2030   Pneumonia Vaccine  Completed   HPV Vaccine  Aged Out   Colon Cancer Screening  Discontinued   Cologuard (Stool DNA test)  Discontinued  *Topic was postponed. The date shown is not the original due date.    Advanced directives: copies in chart   Conditions/risks identified: stay healthy   Next appointment: Follow up in one year for your annual wellness visit.   Preventive Care 76 Years and Older, Male  Preventive care refers to lifestyle choices and visits with your health care provider that can promote health and wellness. What does preventive care include? A yearly physical exam. This is also called an annual well check. Dental exams once or twice a year. Routine eye exams. Ask your health care provider how often you should have your eyes checked. Personal lifestyle choices, including: Daily care of your teeth and gums. Regular physical activity. Eating a healthy diet. Avoiding tobacco and drug use. Limiting alcohol use. Practicing safe sex. Taking low doses of aspirin every day. Taking vitamin and mineral supplements as recommended by your health care provider. What happens during  an annual well check? The services and screenings done by your health care provider during your annual well check will depend on your age, overall health, lifestyle risk factors, and family history of disease. Counseling  Your health care provider may ask you questions about your: Alcohol use. Tobacco use. Drug use. Emotional well-being. Home and relationship well-being. Sexual activity. Eating habits. History of falls. Memory and ability to understand (cognition). Work and work Statistician. Screening  You may have the following tests or measurements: Height, weight, and BMI. Blood pressure. Lipid and cholesterol levels. These may be checked every 5 years, or more frequently if you are over 35 years old. Skin check. Lung cancer screening. You may have this screening every year starting at age 24 if you have a 30-pack-year history of smoking and currently smoke or have quit within the past 15 years. Fecal occult blood test (FOBT) of the stool. You may have this test every year starting at age 54. Flexible sigmoidoscopy or colonoscopy. You may have a sigmoidoscopy every 5 years or a colonoscopy every 10 years starting at age 40. Prostate cancer screening. Recommendations will vary depending on your family history and other risks. Hepatitis C blood test. Hepatitis B blood test. Sexually transmitted disease (STD) testing. Diabetes screening. This is done by checking your blood sugar (glucose) after you have not eaten for a while (fasting). You may  have this done every 1-3 years. Abdominal aortic aneurysm (AAA) screening. You may need this if you are a current or former smoker. Osteoporosis. You may be screened starting at age 19 if you are at high risk. Talk with your health care provider about your test results, treatment options, and if necessary, the need for more tests. Vaccines  Your health care provider may recommend certain vaccines, such as: Influenza vaccine. This is recommended  every year. Tetanus, diphtheria, and acellular pertussis (Tdap, Td) vaccine. You may need a Td booster every 10 years. Zoster vaccine. You may need this after age 21. Pneumococcal 13-valent conjugate (PCV13) vaccine. One dose is recommended after age 26. Pneumococcal polysaccharide (PPSV23) vaccine. One dose is recommended after age 17. Talk to your health care provider about which screenings and vaccines you need and how often you need them. This information is not intended to replace advice given to you by your health care provider. Make sure you discuss any questions you have with your health care provider. Document Released: 08/06/2015 Document Revised: 03/29/2016 Document Reviewed: 05/11/2015 Elsevier Interactive Patient Education  2017 [place] Prevention in the Home Falls can cause injuries. They can happen to people of all ages. There are many things you can do to make your home safe and to help prevent falls. What can I do on the outside of my home? Regularly fix the edges of walkways and driveways and fix any cracks. Remove anything that might make you trip as you walk through a door, such as a raised step or threshold. Trim any bushes or trees on the path to your home. Use bright outdoor lighting. Clear any walking paths of anything that might make someone trip, such as rocks or tools. Regularly check to see if handrails are loose or broken. Make sure that both sides of any steps have handrails. Any raised decks and porches should have guardrails on the edges. Have any leaves, snow, or ice cleared regularly. Use sand or salt on walking paths during winter. Clean up any spills in your garage right away. This includes oil or grease spills. What can I do in the bathroom? Use night lights. Install grab bars by the toilet and in the tub and shower. Do not use towel bars as grab bars. Use non-skid mats or decals in the tub or shower. If you need to sit down in the shower,  use a plastic, non-slip stool. Keep the floor dry. Clean up any water that spills on the floor as soon as it happens. Remove soap buildup in the tub or shower regularly. Attach bath mats securely with double-sided non-slip rug tape. Do not have throw rugs and other things on the floor that can make you trip. What can I do in the bedroom? Use night lights. Make sure that you have a light by your bed that is easy to reach. Do not use any sheets or blankets that are too big for your bed. They should not hang down onto the floor. Have a firm chair that has side arms. You can use this for support while you get dressed. Do not have throw rugs and other things on the floor that can make you trip. What can I do in the kitchen? Clean up any spills right away. Avoid walking on wet floors. Keep items that you use a lot in easy-to-reach places. If you need to reach something above you, use a strong step stool that has a grab bar. Keep electrical cords  out of the way. Do not use floor polish or wax that makes floors slippery. If you must use wax, use non-skid floor wax. Do not have throw rugs and other things on the floor that can make you trip. What can I do with my stairs? Do not leave any items on the stairs. Make sure that there are handrails on both sides of the stairs and use them. Fix handrails that are broken or loose. Make sure that handrails are as long as the stairways. Check any carpeting to make sure that it is firmly attached to the stairs. Fix any carpet that is loose or worn. Avoid having throw rugs at the top or bottom of the stairs. If you do have throw rugs, attach them to the floor with carpet tape. Make sure that you have a light switch at the top of the stairs and the bottom of the stairs. If you do not have them, ask someone to add them for you. What else can I do to help prevent falls? Wear shoes that: Do not have high heels. Have rubber bottoms. Are comfortable and fit you  well. Are closed at the toe. Do not wear sandals. If you use a stepladder: Make sure that it is fully opened. Do not climb a closed stepladder. Make sure that both sides of the stepladder are locked into place. Ask someone to hold it for you, if possible. Clearly mark and make sure that you can see: Any grab bars or handrails. First and last steps. Where the edge of each step is. Use tools that help you move around (mobility aids) if they are needed. These include: Canes. Walkers. Scooters. Crutches. Turn on the lights when you go into a dark area. Replace any light bulbs as soon as they burn out. Set up your furniture so you have a clear path. Avoid moving your furniture around. If any of your floors are uneven, fix them. If there are any pets around you, be aware of where they are. Review your medicines with your doctor. Some medicines can make you feel dizzy. This can increase your chance of falling. Ask your doctor what other things that you can do to help prevent falls. This information is not intended to replace advice given to you by your health care provider. Make sure you discuss any questions you have with your health care provider. Document Released: 05/06/2009 Document Revised: 12/16/2015 Document Reviewed: 08/14/2014 Elsevier Interactive Patient Education  2017 [employer].

## 2022-06-14 ENCOUNTER — Other Ambulatory Visit: Payer: Self-pay | Admitting: Neurology

## 2022-06-14 DIAGNOSIS — G20A1 Parkinson's disease without dyskinesia, without mention of fluctuations: Secondary | ICD-10-CM

## 2022-06-19 DIAGNOSIS — R3915 Urgency of urination: Secondary | ICD-10-CM | POA: Diagnosis not present

## 2022-06-19 DIAGNOSIS — R350 Frequency of micturition: Secondary | ICD-10-CM | POA: Diagnosis not present

## 2022-06-19 DIAGNOSIS — N3941 Urge incontinence: Secondary | ICD-10-CM | POA: Diagnosis not present

## 2022-06-19 DIAGNOSIS — N401 Enlarged prostate with lower urinary tract symptoms: Secondary | ICD-10-CM | POA: Diagnosis not present

## 2022-06-21 DIAGNOSIS — R3915 Urgency of urination: Secondary | ICD-10-CM | POA: Diagnosis not present

## 2022-06-21 DIAGNOSIS — N3941 Urge incontinence: Secondary | ICD-10-CM | POA: Diagnosis not present

## 2022-06-21 DIAGNOSIS — N401 Enlarged prostate with lower urinary tract symptoms: Secondary | ICD-10-CM | POA: Diagnosis not present

## 2022-06-21 DIAGNOSIS — R350 Frequency of micturition: Secondary | ICD-10-CM | POA: Diagnosis not present

## 2022-07-06 ENCOUNTER — Encounter: Payer: Self-pay | Admitting: *Deleted

## 2022-07-27 DIAGNOSIS — R350 Frequency of micturition: Secondary | ICD-10-CM | POA: Diagnosis not present

## 2022-07-27 DIAGNOSIS — N3941 Urge incontinence: Secondary | ICD-10-CM | POA: Diagnosis not present

## 2022-07-27 DIAGNOSIS — R3915 Urgency of urination: Secondary | ICD-10-CM | POA: Diagnosis not present

## 2022-07-27 DIAGNOSIS — N401 Enlarged prostate with lower urinary tract symptoms: Secondary | ICD-10-CM | POA: Diagnosis not present

## 2022-07-30 ENCOUNTER — Other Ambulatory Visit: Payer: Self-pay | Admitting: Family Medicine

## 2022-08-02 ENCOUNTER — Other Ambulatory Visit: Payer: Self-pay | Admitting: Family Medicine

## 2022-09-18 ENCOUNTER — Other Ambulatory Visit: Payer: Self-pay | Admitting: Neurology

## 2022-09-18 DIAGNOSIS — G20A1 Parkinson's disease without dyskinesia, without mention of fluctuations: Secondary | ICD-10-CM

## 2022-09-19 DIAGNOSIS — D225 Melanocytic nevi of trunk: Secondary | ICD-10-CM | POA: Diagnosis not present

## 2022-09-19 DIAGNOSIS — L821 Other seborrheic keratosis: Secondary | ICD-10-CM | POA: Diagnosis not present

## 2022-09-19 DIAGNOSIS — D485 Neoplasm of uncertain behavior of skin: Secondary | ICD-10-CM | POA: Diagnosis not present

## 2022-09-19 DIAGNOSIS — Z85820 Personal history of malignant melanoma of skin: Secondary | ICD-10-CM | POA: Diagnosis not present

## 2022-09-19 DIAGNOSIS — Z85828 Personal history of other malignant neoplasm of skin: Secondary | ICD-10-CM | POA: Diagnosis not present

## 2022-09-19 DIAGNOSIS — D2271 Melanocytic nevi of right lower limb, including hip: Secondary | ICD-10-CM | POA: Diagnosis not present

## 2022-09-19 DIAGNOSIS — L218 Other seborrheic dermatitis: Secondary | ICD-10-CM | POA: Diagnosis not present

## 2022-09-20 ENCOUNTER — Other Ambulatory Visit: Payer: PPO | Admitting: Hospice

## 2022-09-20 DIAGNOSIS — I890 Lymphedema, not elsewhere classified: Secondary | ICD-10-CM

## 2022-09-20 DIAGNOSIS — R471 Dysarthria and anarthria: Secondary | ICD-10-CM | POA: Diagnosis not present

## 2022-09-20 DIAGNOSIS — Z515 Encounter for palliative care: Secondary | ICD-10-CM

## 2022-09-20 DIAGNOSIS — G20B2 Parkinson's disease with dyskinesia, with fluctuations: Secondary | ICD-10-CM | POA: Diagnosis not present

## 2022-09-20 NOTE — Progress Notes (Signed)
[facility] Palliative Care Consult Note Telephone: [phone]  Fax: [phone]  PATIENT NAME: [name] [address] [phone] (home)  DOB: [birth year] MRN: [id number]  PRIMARY CARE PROVIDER:    [name], MD,  [place] [address] [phone]  REFERRING PROVIDER:   [name], [place],  [place] [address] [phone]  RESPONSIBLE PARTY:   Self/Spouse Dinosaur - best number to call [name] - caregiver Pet is [place]     Name Relation Home Work [place] Spouse [phone]  [phone]       I met face to face with patient and family at home. Palliative Care was asked to follow this patient by consultation request of  [name], MD to address advance care planning, complex medical decision making and goals of care clarification. [name] is at home with patient during visit. Caregiver - [place] are home with patient during visit. This is a follow up visit.    ASSESSMENT AND / RECOMMENDATIONS:   CODE STATUS:Patient is a Do Not Resuscitate.   Goals of Care: Goals include to maximize quality of life and symptom management.  Symptom Management/Plan: Dysarthria:Improved; SLP discharged. Parkinson Disease: with hypokinetic rigid syndrome; currently non ambulatory. Follow up Neurologist in March 2024 as planned. Continue Sinemet and scheduled. Pt uses stand up lift to help with standing and transitioning to wheelchair. More difficulty with transfers in spite of standup lift  and chair asistance. Fall precautions discussed.  Education provided on safety measures for transfers. Lymphedema: related to Melanoma on his left feet; improved.  Discharged from [facility] with [facility].  Continue to elevate BLE.  Follow up: Palliative care will continue to follow for complex  medical decision making, advance care planning, and clarification of goals. Return 6 weeks or prn.Encouraged to call provider sooner with any concerns.  Family /Caregiver/Community Supports: Patient lives at home with spouse. He has caregivers that come to help patient in ADLs.   HOSPICE ELIGIBILITY/DIAGNOSIS: TBD  Chief Complaint: Follow up visit  HISTORY OF PRESENT ILLNESS:  Nicholas [name] is a 77 y.o. year old male  with multiple morbidities requiring close monitoring/management, with high risk of mortality:  Parkinson disease with hypokinetic rigid syndrome, dysarthria with hypophonic voice, lymphedema.  Patient denies pain/discomfort, in no acute distress; no hospitalization since last visit. History of Melanoma, History obtained from review of EMR, discussion with primary team, caregiver, family and/or Mr. Gadison.  Review and summarization of Epic records shows history from other than patient. Rest of 10 point ROS asked and negative.  I reviewed, as needed, available labs, patient records, imaging, studies and related documents from the EMR.  Physical Exam:  Constitutional: NAD General: Well groomed, cooperative EYES: anicteric sclera, lids intact, no discharge  ENMT: Moist mucous membrane CV: S1 S2, RRR Pulmonary: LCTA, no increased work of breathing, no cough, Abdomen: active BS + 4 quadrants, soft and non tender GU: no suprapubic tenderness MSK: weakness, limited ROM, lymphedema in BL extremities improving Skin: warm and dry, no rashes or wounds on visible skin Neuro:  weakness, otherwise non focal, oriented x 3 Psych: non-anxious affect Hem/lymph/immuno: no widespread bruising   PAST MEDICAL HISTORY:  Active Ambulatory Problems    Diagnosis Date Noted   History of malignant melanoma. left leg 03/09/2009   Hyperlipidemia 12/13/2006   Essential hypertension 12/13/2006   Insomnia  10/03/2007   Morbid obesity ([place]) 07/13/2010   Hyperglycemia 08/02/2012   Chronic venous  insufficiency 02/20/2014   Former smoker [id number]   Diastolic dysfunction [id number]   Dizziness 12/30/2014   Parkinson's plus syndrome 06/22/2015   Hypersomnia 11/15/2015   Cough 11/15/2015   OSA (obstructive sleep apnea) 12/28/2015   Senile purpura ([place]) 05/03/2016   BPH associated with nocturia 05/16/2016   Lymphedema 02/10/2017   Strain of right biceps 09/12/2017   Cellulitis 04/12/2020   Sepsis without acute organ dysfunction ([place])    Parkinson's disease 01/11/2021   Prediabetes 02/23/2022   Obesity (BMI 35.0-39.9 without comorbidity) 02/24/2022   Pressure injury of buttock, stage 2 ([place]) 02/24/2022   SIRS (systemic inflammatory response syndrome) ([place]) 03/01/2022   Resolved Ambulatory Problems    Diagnosis Date Noted   Chronic venous insufficiency 11/20/2014   Sepsis ([place]) 02/23/2022   Severe sepsis ([place]) 02/24/2022   Past Medical History:  Diagnosis Date   Cancer ([place]) Jan 2008   Hypertension     SOCIAL HX:  Social History   Tobacco Use   Smoking status: Former    Packs/day: 1.00    Years: 16.00    Total pack years: 16.00    Types: Cigarettes    Quit date: 12/22/1993    Years since quitting: 28.7   Smokeless tobacco: Never  Substance Use Topics   Alcohol use: No    Alcohol/week: 21.0 standard drinks of alcohol    Types: 21 Standard drinks or equivalent per week     FAMILY HX:  Family History  Problem Relation Age of Onset   Hypertension Father    CVA Father        age 63   Hyperlipidemia Father    Other Mother        Deceased   Healthy Sister    Healthy Brother       ALLERGIES:  Allergies  Allergen Reactions   Amoxicillin Swelling    Presumed angioedema of lips       PERTINENT MEDICATIONS:  Outpatient Encounter Medications as of 09/20/2022  Medication Sig   acetaminophen (TYLENOL) 500 MG tablet Take 1,000 mg by mouth every 6 (six) hours as needed for mild pain.   albuterol (VENTOLIN HFA) 108 (90 Base) MCG/ACT inhaler Inhale 2 puffs into  the lungs every 6 (six) hours as needed for wheezing or shortness of breath.   carbidopa-levodopa (SINEMET IR) 25-100 MG tablet TAKE 2 TABLETS AT 7AM,11AM, 3PM, AND AT 7PM.   polyethylene glycol (MIRALAX / GLYCOLAX) 17 g packet Take 17 g by mouth daily as needed for moderate constipation.   rosuvastatin (CRESTOR) 10 MG tablet TAKE ONE TABLET BY MOUTH DAILY   Vibegron (GEMTESA PO) Take 75 mg by mouth at bedtime. Through urology (Patient not taking: Reported on 06/05/2022)   No facility-administered encounter medications on file as of 09/20/2022.  I spent 60 minutes providing this consultation; time includes spent with patient/family, chart review and documentation. More than 50% of the time in this consultation was spent on care coordination.  Thank you for the opportunity to participate in the care of Mr. Jeanfreau.  The palliative care team will continue to follow. Please call our office at [phone] if we can be of additional assistance.   Note: Portions of this note were generated with Lobbyist. Dictation errors may occur despite best attempts at proofreading.  Teodoro Spray, NP

## 2022-10-09 NOTE — Progress Notes (Unsigned)
Assessment/Plan:   1.  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  Discussed ST. he was agreeable to referral.  -Really should not be transferring at all by himself.  The falls have occurred in the transfers.  He has no [employer] fast streamer.  May need more caregivers in the home.   2.  History of melanoma  -Follows with dermatology.  Understands that parkinsonism does increase risk for melanoma as well.  Has appt in Feb  3.  Lymphedema  -Follows with wound care.  His leg, particularly the right leg, is markedly improved compared to previous.  4.  Sialorrhea  -pt not ready for botox  Subjective:   Nicholas [name] was seen today in follow up for parkinsonism.  My previous records were reviewed prior to todays visit as well as outside records available to me.  The only provider that patient saw since our visit back in September is a palliative care nurse practitioner on February 28.  Current prescribed movement disorder medications: Carbidopa/levodopa 25/100, 2 tablets 4 times per day    ALLERGIES:   Allergies  Allergen Reactions   Amoxicillin Swelling    Presumed angioedema of lips due to prednisone    CURRENT MEDICATIONS:  Outpatient Encounter Medications as of 07/26/2021  Medication Sig   albuterol (VENTOLIN HFA) 108 (90 Base) MCG/ACT inhaler Inhale 2 puffs into the lungs every 6 (six) hours as needed for wheezing or shortness of breath.   carbidopa-levodopa (SINEMET IR) 25-100 MG tablet TAKE 2 TABLETS AT 7AM,11AM, 3PM, AND AT 7PM.   Infant Care Products (DERMACLOUD) OINT Apply topically.   rosuvastatin (CRESTOR) 10 MG tablet TAKE ONE TABLET BY MOUTH DAILY   [DISCONTINUED] carbidopa-levodopa (SINEMET IR) 25-100 MG tablet TAKE 2 TABLETS AT 7AM,11AM, 3PM, AND AT 7PM.   No facility-administered encounter medications on file as of 07/26/2021.    Objective:   PHYSICAL EXAMINATION:     VITALS:   Vitals:   07/26/21 1456  BP: 126/78  Pulse: 76  SpO2: 95%  Weight: 230 lb (104.3 kg)  Height: 5\' 7"  (1.702 m)      GEN:  The patient appears stated age and is in NAD. HEENT:  Normocephalic, atraumatic.  The mucous membranes are moist. The superficial temporal arteries are without ropiness or tenderness. CV:  RRR Lungs:  CTAB Neck/HEME:  There are no carotid bruits bilaterally.  Neurological examination:  Orientation: The patient is alert and oriented x3. Cranial nerves: There is good facial symmetry with facial hypomimia. EOMI with the exception of upgaze paresis.  There is square wave jerks.  The speech is fluent and clear but has fairly significant hypophonia.  Soft palate rises symmetrically and there is no tongue deviation. Hearing is intact to conversational tone. Sensation: Sensation is intact to light touch throughout Motor: Strength is at least antigravity x4.  Movement examination: Tone: There is mild increased tone in the [place] (same as previous) Abnormal movements: none Coordination:  There is no  decremation with RAM's, with hand opening and closing and finger taps bilaterally Gait and Station: Not tested today due to instability/wheelchair-bound.  He is leaned back in his electric wheelchair  I have reviewed and interpreted the following labs independently    Chemistry      Component Value Date/Time   NA 141 05/24/2021 1338   K 4.2 05/24/2021 1338   CL 106 05/24/2021 1338   CO2 28 05/24/2021  1338   BUN 22 05/24/2021 1338   CREATININE 0.87 05/24/2021 1338   CREATININE 1.01 10/03/2019 1517      Component Value Date/Time   CALCIUM 9.0 05/24/2021 1338   ALKPHOS 82 05/24/2021 1338   AST 9 05/24/2021 1338   ALT 3 05/24/2021 1338   BILITOT 0.4 05/24/2021 1338       Lab Results  Component Value Date   WBC 8.1 05/24/2021   HGB 14.0 05/24/2021   HCT 43.2 05/24/2021   MCV 88.6 05/24/2021   PLT 188.0 05/24/2021    Lab Results  Component  Value Date   TSH 1.42 06/19/2018     Total time spent on today's visit was *** minutes, including both face-to-face time and nonface-to-face time.  Time included that spent on review of records (prior notes available to me/labs/imaging if pertinent), discussing treatment and goals, answering patient's questions and coordinating care.  Cc:  [name], MD

## 2022-10-10 ENCOUNTER — Other Ambulatory Visit (HOSPITAL_COMMUNITY): Payer: Self-pay

## 2022-10-10 ENCOUNTER — Encounter: Payer: Self-pay | Admitting: Neurology

## 2022-10-10 ENCOUNTER — Ambulatory Visit: Payer: PPO | Admitting: Neurology

## 2022-10-10 VITALS — BP 132/78 | HR 61 | Ht 67.0 in | Wt 218.0 lb

## 2022-10-10 DIAGNOSIS — G20C Parkinsonism, unspecified: Secondary | ICD-10-CM | POA: Diagnosis not present

## 2022-10-10 DIAGNOSIS — K117 Disturbances of salivary secretion: Secondary | ICD-10-CM

## 2022-10-10 MED ORDER — CARBIDOPA-LEVODOPA ER 50-200 MG PO TBCR: 1.0000 | EXTENDED_RELEASE_TABLET | Freq: Every day | ORAL | 1 refills | Status: DC

## 2022-10-10 NOTE — Patient Instructions (Signed)
ADD carbidopa/levodopa 50/200 at bedtime

## 2022-10-11 ENCOUNTER — Telehealth: Payer: Self-pay | Admitting: Pharmacy Technician

## 2022-10-11 NOTE — Telephone Encounter (Signed)
Patient Advocate Encounter  Received notification from [place] that prior authorization for [facility] [id number] is required.   PA submitted on 3.20.24 VIA FAX FAX# [phone] Status is pending

## 2022-10-20 DIAGNOSIS — N3941 Urge incontinence: Secondary | ICD-10-CM | POA: Diagnosis not present

## 2022-10-20 DIAGNOSIS — R32 Unspecified urinary incontinence: Secondary | ICD-10-CM | POA: Diagnosis not present

## 2022-11-13 ENCOUNTER — Ambulatory Visit (INDEPENDENT_AMBULATORY_CARE_PROVIDER_SITE_OTHER): Payer: PPO | Admitting: Family Medicine

## 2022-11-13 ENCOUNTER — Encounter: Payer: Self-pay | Admitting: Family Medicine

## 2022-11-13 VITALS — BP 118/77 | HR 77 | Resp 16 | Ht 67.0 in | Wt 218.0 lb

## 2022-11-13 DIAGNOSIS — I10 Essential (primary) hypertension: Secondary | ICD-10-CM

## 2022-11-13 DIAGNOSIS — E538 Deficiency of other specified B group vitamins: Secondary | ICD-10-CM | POA: Diagnosis not present

## 2022-11-13 DIAGNOSIS — Z131 Encounter for screening for diabetes mellitus: Secondary | ICD-10-CM

## 2022-11-13 DIAGNOSIS — G20C Parkinsonism, unspecified: Secondary | ICD-10-CM

## 2022-11-13 DIAGNOSIS — I890 Lymphedema, not elsewhere classified: Secondary | ICD-10-CM | POA: Diagnosis not present

## 2022-11-13 DIAGNOSIS — R739 Hyperglycemia, unspecified: Secondary | ICD-10-CM

## 2022-11-13 DIAGNOSIS — E7800 Pure hypercholesterolemia, unspecified: Secondary | ICD-10-CM

## 2022-11-13 NOTE — Patient Instructions (Addendum)
Please check with your pharmacy to see if they have the shingrix vaccine. If they do- please get this immunization and update [place] by phone call or mychart with dates you receive the vaccine  Schedule a lab visit at the check out desk within 2 weeks. Return for future fasting labs meaning nothing but water after midnight please. Ok to take your medications with water.   No changes today unless labs lead [place] to make changes   Recommended follow up: Return in about 7 months (around 06/15/2023) for physical or sooner if needed.Schedule b4 you leave.

## 2022-11-13 NOTE — Progress Notes (Signed)
Phone [phone] In person visit   Subjective:   Nicholas [name] is a 77 y.o. year old very pleasant male patient who presents for/with See problem oriented charting Chief Complaint  Patient presents with   Medical Management of Chronic Issues    Discuss admission to SNF   Past Medical History-  Patient Active Problem List   Diagnosis Date Noted   Lymphedema 02/10/2017    Priority: High   Parkinson's plus syndrome 06/22/2015    Priority: High   History of malignant melanoma. left leg 03/09/2009    Priority: High   B12 deficiency 11/13/2022    Priority: Medium    BPH associated with nocturia 05/16/2016    Priority: Medium    OSA (obstructive sleep apnea) 12/28/2015    Priority: Medium    Diastolic dysfunction 10/22/2014    Priority: Medium    Chronic venous insufficiency 02/20/2014    Priority: Medium    Hyperglycemia 08/02/2012    Priority: Medium    Hyperlipidemia 12/13/2006    Priority: Medium    Essential hypertension 12/13/2006    Priority: Medium    Hypersomnia 11/15/2015    Priority: Low   Former smoker 04/29/2014    Priority: Low   Insomnia 10/03/2007    Priority: Low   SIRS (systemic inflammatory response syndrome) 03/01/2022   Obesity (BMI 35.0-39.9 without comorbidity) 02/24/2022   Prediabetes 02/23/2022   Parkinson's disease 01/11/2021   Sepsis without acute organ dysfunction    Cellulitis 04/12/2020   Strain of right biceps 09/12/2017   Cough 11/15/2015   Dizziness 12/30/2014    Medications- reviewed and updated Current Outpatient Medications  Medication Sig Dispense Refill   acetaminophen (TYLENOL) 500 MG tablet Take 1,000 mg by mouth every 6 (six) hours as needed for mild pain.     albuterol (VENTOLIN HFA) 108 (90 Base) MCG/ACT inhaler Inhale 2 puffs into the lungs every 6 (six) hours as needed for wheezing or shortness of breath. 18 g 1   carbidopa-levodopa (SINEMET CR) 50-200 MG tablet Take 1 tablet by mouth at bedtime. 90 tablet 1    carbidopa-levodopa (SINEMET IR) 25-100 MG tablet TAKE 2 TABLETS AT 7AM,11AM, 3PM, AND AT 7PM. 720 tablet 0   cyanocobalamin 1000 MCG tablet Take 2,000 mcg by mouth daily.     polyethylene glycol (MIRALAX / GLYCOLAX) 17 g packet Take 17 g by mouth daily as needed for moderate constipation. 14 each 0   rosuvastatin (CRESTOR) 10 MG tablet TAKE ONE TABLET BY MOUTH DAILY 90 tablet 0   No current facility-administered medications for this visit.     Objective:  BP 118/77   Pulse 77   Resp 16   Ht  (1.702 m)   Wt 218 lb (98.9 kg)   SpO2 95%   BMI 34.14 kg/m  Gen: NAD, resting comfortably CV: RRR no murmurs rubs or gallops Lungs: CTAB no crackles, wheeze, rhonchi Ext: nonpitting 2+ edema/llymphedema noted Skin: warm, dry Neuro: wheelchair bound- heavy assist to stand    Assessment and Plan   #social update- son with heart attack recently and that has been hard -ongoing decline in health- needs more support- looking at [facility] or [facility].   #Parkinson's plus syndrome-followed by Dr. Arbutus Leas of [facility] S: Medication: Carbidopa levodopa 25-100 mg-patient takes 2 tablets at 7 AM, 11 AM, 3 PM and 7 PM.  Caregiver 7 days a week now.   -Primarily moves with [phone] wheelchair but part is broken A/P: Parkinson's plus noted- continue  current medications and neurology follow up   -planned botox for drooling with Dr. Arbutus Leas -some numbness into both hands at times- will check B12 level and TSH- wonder if some of this is compression with arms propped up- can also schedule follow up with Dr.. [name] for her opinion - possible neuropathy  #Lymphedema with history of malignant melanoma left leg S: Severe lymphedema after history of malignant melanoma in the left leg.  Has required treatment at lymphedema clinic with [facility] in the past.  No recent treatments- has been able to get compression stockings on though A/P: actually looks better than usual- has done a good job with  compression stockings with regular caregiver support  -does need update compression stockings- printing for him   #hypertension S: medication: None at this point-has been taken off medication  A/P: stable no medications- continue to monitor    #hyperlipidemia S: Medication: Crestor 10 mg  daily Lab Results  Component Value Date   CHOL 96 02/26/2022   HDL 33 (L) 02/26/2022   LDLCALC 45 02/26/2022   LDLDIRECT 50.0 05/03/2016   TRIG 91 02/26/2022   CHOLHDL 2.9 02/26/2022  A/P: ideal control- ccme   #OSA- doesn't need CPAP as stays propped up    # Hyperglycemia/insulin resistance/prediabetes-peak A1c 5.29 September 2019 S:  Medication: none Lab Results  Component Value Date   HGBA1C 5.9 12/01/2021   HGBA1C 5.8 (H) 10/03/2019   HGBA1C 5.6 02/09/2017  A/P: update a1c when he comes back for labs- no medications for now  # B12 deficiency S: Current treatment/medication (oral vs. IM): takes 2000 mcg dialy Lab Results  Component Value Date   VITAMINB12 229 12/01/2021  A/P:   hopefully stable- update b12 today. Continue current meds for now   #BPH associated with nocturia/OAB- no Rx as he had orthostasis with Flomax -prior gemtessa -- wears condom cath and finds helpful- depends if not  Recommended follow up: Return in about 7 months (around 06/15/2023) for physical or sooner if needed.Schedule b4 you leave. Future Appointments  Date Time Provider Department Center  04/12/2023  3:30 PM [name], Octaviano Batty, DO [facility] None   Lab/Order associations:   ICD-10-CM   1. Parkinson's plus syndrome  G20.C     2. Lymphedema  I89.0 For home use only DME Other see comment    3. Essential hypertension  I10     4. Pure hypercholesterolemia  E78.00 Comprehensive metabolic panel    CBC with Differential/Platelet    5. Hyperglycemia  R73.9 HgB A1c    6. B12 deficiency  E53.8 Vitamin B12    7. Screening for diabetes mellitus  Z13.1 HgB A1c      No orders of the defined types were placed in  this encounter.   Return precautions advised.  Tana Conch, MD

## 2022-11-14 LAB — CBC WITH DIFFERENTIAL/PLATELET
Basophils Absolute: 0.1 10*3/uL (ref 0.0–0.1)
Basophils Relative: 0.7 % (ref 0.0–3.0)
Eosinophils Absolute: 0.2 10*3/uL (ref 0.0–0.7)
Eosinophils Relative: 2.8 % (ref 0.0–5.0)
HCT: 46.5 % (ref 39.0–52.0)
Hemoglobin: 15.4 g/dL (ref 13.0–17.0)
Lymphocytes Relative: 16.5 % (ref 12.0–46.0)
Lymphs Abs: 1.4 10*3/uL (ref 0.7–4.0)
MCHC: 33 g/dL (ref 30.0–36.0)
MCV: 88.7 fl (ref 78.0–100.0)
Monocytes Absolute: 0.6 10*3/uL (ref 0.1–1.0)
Monocytes Relative: 6.9 % (ref 3.0–12.0)
Neutro Abs: 6.3 10*3/uL (ref 1.4–7.7)
Neutrophils Relative %: 73.1 % (ref 43.0–77.0)
Platelets: 189 10*3/uL (ref 150.0–400.0)
RBC: 5.25 Mil/uL (ref 4.22–5.81)
RDW: 15.1 % (ref 11.5–15.5)
WBC: 8.6 10*3/uL (ref 4.0–10.5)

## 2022-11-14 LAB — COMPREHENSIVE METABOLIC PANEL
ALT: 2 U/L (ref 0–53)
AST: 6 U/L (ref 0–37)
Albumin: 4.3 g/dL (ref 3.5–5.2)
Alkaline Phosphatase: 75 U/L (ref 39–117)
BUN: 20 mg/dL (ref 6–23)
CO2: 26 mEq/L (ref 19–32)
Calcium: 9.1 mg/dL (ref 8.4–10.5)
Chloride: 103 mEq/L (ref 96–112)
Creatinine, Ser: 0.9 mg/dL (ref 0.40–1.50)
GFR: 82.9 mL/min (ref >60.00)
Glucose, Bld: 85 mg/dL (ref 70–99)
Potassium: 4.2 mEq/L (ref 3.5–5.1)
Sodium: 138 mEq/L (ref 135–145)
Total Bilirubin: 0.7 mg/dL (ref 0.2–1.2)
Total Protein: 6.4 g/dL (ref 6.0–8.3)

## 2022-11-14 LAB — TSH: TSH: 1.79 u[IU]/mL (ref 0.35–5.50)

## 2022-11-14 LAB — HEMOGLOBIN A1C: Hgb A1c MFr Bld: 5.9 % (ref 4.6–6.5)

## 2022-11-14 LAB — VITAMIN B12: Vitamin B-12: 1162 pg/mL — ABNORMAL HIGH (ref 211–911)

## 2022-11-16 ENCOUNTER — Other Ambulatory Visit: Payer: PPO | Admitting: Hospice

## 2022-11-16 DIAGNOSIS — Z515 Encounter for palliative care: Secondary | ICD-10-CM | POA: Diagnosis not present

## 2022-11-16 DIAGNOSIS — G20B2 Parkinson's disease with dyskinesia, with fluctuations: Secondary | ICD-10-CM | POA: Diagnosis not present

## 2022-11-16 DIAGNOSIS — R471 Dysarthria and anarthria: Secondary | ICD-10-CM | POA: Diagnosis not present

## 2022-11-16 DIAGNOSIS — I890 Lymphedema, not elsewhere classified: Secondary | ICD-10-CM

## 2022-11-16 NOTE — Progress Notes (Signed)
Therapist, nutritional Palliative Care Consult Note Telephone: [phone]  Fax: [phone]  PATIENT NAME: Nicholas [name] [address] [place] [address] [phone] (home)  DOB: [birth year] MRN: [id number]  PRIMARY CARE PROVIDER:    Shelva Majestic, MD,  [address] [place] [address] [phone]  REFERRING PROVIDER:   Shelva Majestic, MD [address] [place],  [place] [address] [phone]  RESPONSIBLE PARTY:   Self/Spouse [phone] - [name] - best number to call Andrey Campanile - caregiver Pet is [facility] Information     Name Relation Home Work Mobile   Yaney,[name] Spouse [phone]  [phone]       I met face to face with patient and family at home. Palliative Care was asked to follow this patient by consultation request of  Shelva Majestic, MD to address advance care planning, complex medical decision making and goals of care clarification. This is a follow up visit. NP called spouse left her voicemail updating on visit. Family looking for SNF for patient due to worsening decline in functional decline; patient might move in a month or two and will like Palliative care to follow.  Visit consisted of counseling and education dealing with the complex and emotionally intense issues of symptom management and palliative care in the setting of serious and potentially life-threatening illness.  Patient reports his favorite game is Jackelyn Knife and that brings him joy and keeps him going.  Therapeutic listening and ample emotional support provided. Palliative care team will continue to support patient, patient's family, and medical team.      ASSESSMENT AND / RECOMMENDATIONS:   CODE STATUS:Patient is a Do Not Resuscitate.   Goals of Care: Goals include to maximize quality of life and symptom management.  Symptom Management/Plan: Patient continues in decline in functional status.  Recent visit PCP 11/13/22 who  recommended SNF for for additional care.    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  Need for skilled nursing facility placement for additional care.  Continue Sinemet and scheduled. Pt uses stand up lift to help with standing and transitioning to wheelchair.  Fall precautions discussed.   Dysarthria:Improved; SLP discharged.  Lymphedema: improved.  Continue compression hose and to elevate BLE. Lymphedema is related to Melanoma on his left feet; Discharged from [facility] with [facility].    Follow up: Palliative care will continue to follow for complex medical decision making, advance care planning, and clarification of goals. Return 6 weeks or prn.Encouraged to call provider sooner with any concerns.  Family /Caregiver/Community Supports: Patient lives at home with spouse. He has caregivers that come to help patient in ADLs.   HOSPICE ELIGIBILITY/DIAGNOSIS: TBD  Chief Complaint: Follow up visit  HISTORY OF PRESENT ILLNESS:  Nicholas [name] is a 77 y.o. year old male  with multiple morbidities requiring close monitoring/management, with high risk of mortality:  Parkinson disease with hypokinetic rigid syndrome, dysarthria with hypophonic voice, lymphedema.  Patient denies pain/discomfort, in no acute distress; no hospitalization since last visit. History of Melanoma.  Patient reports his PCP recommends his  transitioning to SNF.  He is in agreement with this.  Education provided on the benefits. History obtained from review of EMR, discussion with primary team, caregiver, family and/or Mr. Oelkers.  Review and summarization of Epic records shows history from other than patient. Rest of 10 point ROS asked and negative.  I reviewed, as needed, available labs, patient records, imaging, studies and related  documents from the EMR.  Physical Exam:  Constitutional: NAD General: Well groomed, cooperative EYES: anicteric sclera, lids intact, no  discharge  ENMT: Moist mucous membrane CV: S1 S2, RRR Pulmonary: LCTA, no increased work of breathing, no cough, Abdomen: active BS + 4 quadrants, soft and non tender GU: no suprapubic tenderness MSK: weakness, limited ROM, lymphedema in BL extremities improving, compression hose on Skin: warm and dry, no rashes or wounds on visible skin Neuro:  weakness, otherwise non focal, oriented x 3 Psych: non-anxious affect Hem/lymph/immuno: no widespread bruising   PAST MEDICAL HISTORY:  Active Ambulatory Problems    Diagnosis Date Noted   History of malignant melanoma. left leg 03/09/2009   Hyperlipidemia 12/13/2006   Essential hypertension 12/13/2006   Insomnia 10/03/2007   Hyperglycemia 08/02/2012   Chronic venous insufficiency 02/20/2014   Former smoker 04/29/2014   Diastolic dysfunction 10/22/2014   Dizziness 12/30/2014   Parkinson's plus syndrome 06/22/2015   Hypersomnia 11/15/2015   Cough 11/15/2015   OSA (obstructive sleep apnea) 12/28/2015   BPH associated with nocturia 05/16/2016   Lymphedema 02/10/2017   Strain of right biceps 09/12/2017   Cellulitis 04/12/2020   Sepsis without acute organ dysfunction    Parkinson's disease 01/11/2021   Prediabetes 02/23/2022   Obesity (BMI 35.0-39.9 without comorbidity) 02/24/2022   SIRS (systemic inflammatory response syndrome) 03/01/2022   B12 deficiency 11/13/2022   Resolved Ambulatory Problems    Diagnosis Date Noted   Morbid obesity (HCC) 07/13/2010   Chronic venous insufficiency 11/20/2014   Senile purpura 05/03/2016   Sepsis 02/23/2022   Severe sepsis 02/24/2022   Pressure injury of buttock, stage 2 02/24/2022   Past Medical History:  Diagnosis Date   Cancer Jan 2008   Hypertension     SOCIAL HX:  Social History   Tobacco Use   Smoking status: Former    Packs/day: 1.00    Years: 16.00    Additional pack years: 0.00    Total pack years: 16.00    Types: Cigarettes    Quit date: 12/22/1993    Years since  quitting: 28.9   Smokeless tobacco: Never  Substance Use Topics   Alcohol use: No    Alcohol/week: 21.0 standard drinks of alcohol    Types: 21 Standard drinks or equivalent per week     FAMILY HX:  Family History  Problem Relation Age of Onset   Hypertension Father    CVA Father        age 52   Hyperlipidemia Father    Other Mother        Deceased   Healthy Sister    Healthy Brother       ALLERGIES:  Allergies  Allergen Reactions   Amoxicillin Swelling    Presumed angioedema of lips       PERTINENT MEDICATIONS:  Outpatient Encounter Medications as of 11/16/2022  Medication Sig   acetaminophen (TYLENOL) 500 MG tablet Take 1,000 mg by mouth every 6 (six) hours as needed for mild pain.   albuterol (VENTOLIN HFA) 108 (90 Base) MCG/ACT inhaler Inhale 2 puffs into the lungs every 6 (six) hours as needed for wheezing or shortness of breath.   carbidopa-levodopa (SINEMET CR) 50-200 MG tablet Take 1 tablet by mouth at bedtime.   carbidopa-levodopa (SINEMET IR) 25-100 MG tablet TAKE 2 TABLETS AT 7AM,11AM, 3PM, AND AT 7PM.   cyanocobalamin 1000 MCG tablet Take 2,000 mcg by mouth daily.   polyethylene glycol (MIRALAX / GLYCOLAX) 17 g packet Take 17 g by mouth  daily as needed for moderate constipation.   rosuvastatin (CRESTOR) 10 MG tablet TAKE ONE TABLET BY MOUTH DAILY   No facility-administered encounter medications on file as of 11/16/2022.  I spent 60 minutes providing this consultation; time includes spent with patient/family, chart review and documentation. More than 50% of the time in this consultation was spent on care coordination.  Thank you for the opportunity to participate in the care of Mr. Sulkowski.  The palliative care team will continue to follow. Please call our office at [phone] if we can be of additional assistance.   Note: Portions of this note were generated with Scientist, clinical (histocompatibility and immunogenetics). Dictation errors may occur despite best attempts at proofreading.  Rosaura Carpenter, NP

## 2022-12-04 ENCOUNTER — Telehealth: Payer: Self-pay | Admitting: Family Medicine

## 2022-12-04 DIAGNOSIS — Z7409 Other reduced mobility: Secondary | ICD-10-CM

## 2022-12-04 NOTE — Telephone Encounter (Signed)
Hosp bed order has been placed electronically through epic.

## 2022-12-04 NOTE — Telephone Encounter (Signed)
Patient's spouse requesting Hospital bed for Patient asap  Requests to be called for status

## 2022-12-08 NOTE — Telephone Encounter (Signed)
Patient's wife Paulette requests to be called for status of Hospital Bed that was ordered. States has not heard from anyone regarding the above.

## 2022-12-11 NOTE — Telephone Encounter (Signed)
Called and lm for pt wife tcb.  

## 2022-12-19 ENCOUNTER — Telehealth: Payer: Self-pay | Admitting: Family Medicine

## 2022-12-19 ENCOUNTER — Other Ambulatory Visit: Payer: PPO | Admitting: Hospice

## 2022-12-19 DIAGNOSIS — R634 Abnormal weight loss: Secondary | ICD-10-CM

## 2022-12-19 DIAGNOSIS — G20B2 Parkinson's disease with dyskinesia, with fluctuations: Secondary | ICD-10-CM | POA: Diagnosis not present

## 2022-12-19 DIAGNOSIS — Z515 Encounter for palliative care: Secondary | ICD-10-CM

## 2022-12-19 DIAGNOSIS — I890 Lymphedema, not elsewhere classified: Secondary | ICD-10-CM

## 2022-12-19 NOTE — Telephone Encounter (Signed)
Home Health Verbal Orders  Agency:  [facility]  Caller: Chakyra   Reason for Request:  Family is requesting that Dr Durene Cal serve as attending and provide an oral CTI. Terminally ill and has 6 months to live   [phone]

## 2022-12-19 NOTE — Progress Notes (Signed)
Therapist, nutritional Palliative Care Consult Note Telephone: [phone]  Fax: [phone]  PATIENT NAME: Nicholas [name] [address] [place] [address] [phone] (home)  DOB: 1945/10/26 MRN: [id number]  PRIMARY CARE PROVIDER:    Shelva Majestic, MD,  [address] [place] [address] [phone]  REFERRING PROVIDER:   Shelva Majestic, MD [address] [place],  [place] [address] [phone]  RESPONSIBLE PARTY:   Self/Spouse [phone] - [name] - best number to call Andrey Campanile - caregiver Pet's name is [facility] Information     Name Relation Home Work Mobile   Scatena,[name] Spouse [phone]  [phone]       I met face to face with patient and family at home. Palliative Care was asked to follow this patient by consultation request of  Shelva Majestic, MD to address advance care planning, complex medical decision making and goals of care clarification. This is a follow up visit initiated by spouse due to patient's worsening decline in functional status.  Spouse and caregiver Pam are with patient at home during visit  ASSESSMENT AND / RECOMMENDATIONS:  ------------------------------------------------------------------------------------------------------------------------------------------------- Advance Care Planning: Our advance care planning conversation included a discussion about:    The value and importance of advance care planning  Difference between Hospice and Palliative care Exploration of goals of care in the event of a sudden injury or illness  Identification and preparation of a healthcare agent  Review and updating or creation of an  advance directive document . Decision not to resuscitate or to de-escalate disease focused treatments due to poor prognosis.  CODE STATUS: Patient is a Do Not resuscitate.   Goals of Care: Goals include to maximize quality of life and symptom management. Family is  interested in hospice service. Collaboration with [facility] Anne Fu and [facility] referral services.   Visit consisted of counseling and education dealing with the complex and emotionally intense issues of symptom management and palliative care in the setting of serious and potentially life-threatening illness.  Palliative care team will continue to support patient, patient's family, and medical team.    I spent 46 minutes providing this consultation. More than 50% of the time in this consultation was spent on counseling patient and coordinating communication. --------------------------------------------------------------------------------------------------------------------------------------  Symptom Management/Plan:  Parkinson with hypokinetic rigid syndrome: worsening despite completion of PT/OT,  currently non ambulatory, total care,  worsening dysarthria with hypophonic voice. Patient continues to decline in functional status.  Recent visit PCP 11/13/22 who recommended SNF for for additional care.  Continue Sinemet and scheduled.  Pt uses Sera steady stand up lift to help with standing and transitioning to wheelchair.  Fall precautions discussed.   Abnormal weight loss: Ongoing weight loss, patient reports weight loss > 60 pounds in the past six months. May 2024 190 Ibs  March 2024 218 Ibs Nov 2023 250 Ibs Monitor weight closely. Offer assistance during meals to ensure adequate oral intake.  Routine CBC CMP  Lymphedema:lymphedema to BLE. Continue compression hose and to elevate BLE. Lymphedema is related to Melanoma on his left feet; Discharged from [facility] with [facility].    Follow up: Palliative care will continue to follow for complex medical decision making, advance care planning, and clarification of goals. Return 6 weeks or prn.Encouraged to call provider sooner with any concerns.  Family /Caregiver/Community Supports: Patient lives at home with spouse. He has  caregivers that come to help patient in ADLs.   HOSPICE ELIGIBILITY/DIAGNOSIS: TBD  Chief Complaint: Follow up visit  HISTORY OF PRESENT ILLNESS:  Nicholas [name] is a 77 y.o. year old male  with multiple morbidities requiring close monitoring/management, with high risk of mortality:  Parkinson disease with hypokinetic rigid syndrome, dysarthria with hypophonic voice, lymphedema.  Patient endorses worsening mobility impairment, and ongoing weight loss despite fair to good appetite; patient denies pain/discomfort, in no acute distress during visit. History of Melanoma.  History obtained from review of EMR, discussion with primary team, caregiver, family and/or Mr. Hadsell.  Review and summarization of Epic records shows history from other than patient. Rest of 10 point ROS asked and negative.  I reviewed, as needed, available labs, patient records, imaging, studies and related documents from the EMR.  Physical Exam:  Constitutional: NAD General: ongoing weight loss, well groomed, cooperative EYES: anicteric sclera, lids intact, no discharge  ENMT: Moist mucous membrane CV: S1 S2, RRR Pulmonary: LCTA, no increased work of breathing, no cough, Abdomen: active BS + 4 quadrants, soft and non tender GU: no suprapubic tenderness MSK: worsening weakness, non ambulatory,  limited ROM Skin: warm and dry, no rashes or wounds on visible skin Neuro:  weakness, otherwise non focal, oriented x 3 Psych: non-anxious affect, cooperative Hem/lymph/immuno: no widespread bruising, lymphedema in BLE    PAST MEDICAL HISTORY:  Active Ambulatory Problems    Diagnosis Date Noted   History of malignant melanoma. left leg 03/09/2009   Hyperlipidemia 12/13/2006   Essential hypertension 12/13/2006   Insomnia 10/03/2007   Hyperglycemia 08/02/2012   Chronic venous insufficiency 02/20/2014   Former smoker 04/29/2014   Diastolic dysfunction 10/22/2014   Dizziness 12/30/2014   Parkinson's plus syndrome 06/22/2015    Hypersomnia 11/15/2015   Cough 11/15/2015   OSA (obstructive sleep apnea) 12/28/2015   BPH associated with nocturia 05/16/2016   Lymphedema 02/10/2017   Strain of right biceps 09/12/2017   Cellulitis 04/12/2020   Sepsis without acute organ dysfunction    Parkinson's disease 01/11/2021   Prediabetes 02/23/2022   Obesity (BMI 35.0-39.9 without comorbidity) 02/24/2022   SIRS (systemic inflammatory response syndrome) 03/01/2022   B12 deficiency 11/13/2022   Resolved Ambulatory Problems    Diagnosis Date Noted   Morbid obesity (HCC) 07/13/2010   Chronic venous insufficiency 11/20/2014   Senile purpura 05/03/2016   Sepsis 02/23/2022   Severe sepsis 02/24/2022   Pressure injury of buttock, stage 2 02/24/2022   Past Medical History:  Diagnosis Date   Cancer Jan 2008   Hypertension     SOCIAL HX:  Social History   Tobacco Use   Smoking status: Former    Packs/day: 1.00    Years: 16.00    Additional pack years: 0.00    Total pack years: 16.00    Types: Cigarettes    Quit date: 12/22/1993    Years since quitting: 28.9   Smokeless tobacco: Never  Substance Use Topics   Alcohol use: No    Alcohol/week: 21.0 standard drinks of alcohol    Types: 21 Standard drinks or equivalent per week     FAMILY HX:  Family History  Problem Relation Age of Onset   Hypertension Father    CVA Father        age 44   Hyperlipidemia Father    Other Mother        Deceased   Healthy Sister    Healthy Brother       ALLERGIES:  Allergies  Allergen Reactions   Amoxicillin Swelling    Presumed angioedema of lips  PERTINENT MEDICATIONS:  Outpatient Encounter Medications as of 11/16/2022  Medication Sig   acetaminophen (TYLENOL) 500 MG tablet Take 1,000 mg by mouth every 6 (six) hours as needed for mild pain.   albuterol (VENTOLIN HFA) 108 (90 Base) MCG/ACT inhaler Inhale 2 puffs into the lungs every 6 (six) hours as needed for wheezing or shortness of breath.   carbidopa-levodopa  (SINEMET CR) 50-200 MG tablet Take 1 tablet by mouth at bedtime.   carbidopa-levodopa (SINEMET IR) 25-100 MG tablet TAKE 2 TABLETS AT 7AM,11AM, 3PM, AND AT 7PM.   cyanocobalamin 1000 MCG tablet Take 2,000 mcg by mouth daily.   polyethylene glycol (MIRALAX / GLYCOLAX) 17 g packet Take 17 g by mouth daily as needed for moderate constipation.   rosuvastatin (CRESTOR) 10 MG tablet TAKE ONE TABLET BY MOUTH DAILY   No facility-administered encounter medications on file as of 11/16/2022.   Thank you for the opportunity to participate in the care of Mr. Ernandez.  The palliative care team will continue to follow. Please call our office at [phone] if we can be of additional assistance.   Note: Portions of this note were generated with Scientist, clinical (histocompatibility and immunogenetics). Dictation errors may occur despite best attempts at proofreading.  Rosaura Carpenter, NP

## 2022-12-19 NOTE — Telephone Encounter (Signed)
Yes thanks 

## 2022-12-19 NOTE — Telephone Encounter (Signed)
See below

## 2022-12-20 ENCOUNTER — Other Ambulatory Visit: Payer: Self-pay | Admitting: Family Medicine

## 2022-12-20 NOTE — Telephone Encounter (Signed)
Called [facility] and VO given.

## 2022-12-23 ENCOUNTER — Other Ambulatory Visit: Payer: Self-pay | Admitting: Neurology

## 2022-12-23 DIAGNOSIS — G20A1 Parkinson's disease without dyskinesia, without mention of fluctuations: Secondary | ICD-10-CM

## 2023-02-22 ENCOUNTER — Telehealth: Payer: Self-pay | Admitting: Family Medicine

## 2023-02-22 NOTE — Telephone Encounter (Signed)
Form in your "review folder". 

## 2023-02-22 NOTE — Telephone Encounter (Signed)
Patient dropped off document FL2, to be filled out by provider. Patient requested to send it back via [phone] Fax within ASAP. Document is located in providers tray at front office.Please advise.

## 2023-02-22 NOTE — Telephone Encounter (Signed)
On your desk

## 2023-02-23 NOTE — Telephone Encounter (Signed)
Caller was Marjean Donna from [employer]. States she was calling for an update on paperowkr since needed for pt to move in. States it can be emailed to her @ [email or web address]  OR faxed to [phone].

## 2023-02-23 NOTE — Telephone Encounter (Signed)
Paper work has been faxed

## 2023-03-05 DIAGNOSIS — G2582 Stiff-man syndrome: Secondary | ICD-10-CM | POA: Diagnosis not present

## 2023-03-05 DIAGNOSIS — M6281 Muscle weakness (generalized): Secondary | ICD-10-CM | POA: Diagnosis not present

## 2023-03-05 DIAGNOSIS — G20B2 Parkinson's disease with dyskinesia, with fluctuations: Secondary | ICD-10-CM | POA: Diagnosis not present

## 2023-03-05 DIAGNOSIS — R634 Abnormal weight loss: Secondary | ICD-10-CM | POA: Diagnosis not present

## 2023-03-05 DIAGNOSIS — Z7409 Other reduced mobility: Secondary | ICD-10-CM | POA: Diagnosis not present

## 2023-03-05 DIAGNOSIS — I890 Lymphedema, not elsewhere classified: Secondary | ICD-10-CM | POA: Diagnosis not present

## 2023-03-05 DIAGNOSIS — Z4789 Encounter for other orthopedic aftercare: Secondary | ICD-10-CM | POA: Diagnosis not present

## 2023-03-06 ENCOUNTER — Telehealth: Payer: Self-pay | Admitting: Family Medicine

## 2023-03-06 NOTE — Telephone Encounter (Signed)
I appreciate the update-please let him know I will certainly miss being his doctor-it has been an honor but we will continue to touch base with his wife for her visits

## 2023-03-06 NOTE — Telephone Encounter (Signed)
Nicholas [name] with [facility] states Patient has been transferred to [name]-will most likely change attending at some point to facility Provider

## 2023-03-06 NOTE — Telephone Encounter (Signed)
FYI

## 2023-04-10 ENCOUNTER — Telehealth: Payer: Self-pay | Admitting: Neurology

## 2023-04-10 NOTE — Telephone Encounter (Signed)
Pt's wife called in to cancel his upcoming appt and wanted me to let Dr. Arbutus Leas know that he is now in skilled nursing at [facility] in [place] and will no long need anymore appointments. She stated she wanted to thank Dr. Arbutus Leas for all she has done.

## 2023-04-12 ENCOUNTER — Ambulatory Visit: Payer: PPO | Admitting: Neurology

## 2023-06-01 ENCOUNTER — Encounter: Payer: PPO | Admitting: Family Medicine

## 2023-06-11 ENCOUNTER — Telehealth: Payer: Self-pay | Admitting: Family Medicine

## 2023-06-15 ENCOUNTER — Telehealth: Payer: Self-pay | Admitting: Family Medicine

## 2023-06-15 NOTE — Telephone Encounter (Signed)
Forms completed and faxed back.

## 2023-06-15 NOTE — Telephone Encounter (Signed)
Received faxed  document [facility] , to be filled out by provider. Patient requested to send it back via Fax within 5-days. Document is located in providers tray at front office.Please advise at [facility]

## 2023-07-25 DEATH — deceased

## 2023-09-25 NOTE — Telephone Encounter (Signed)
 error
